# Patient Record
Sex: Male | Born: 1970 | Race: Black or African American | Hispanic: No | Marital: Married | Smoking: Former smoker
Health system: Southern US, Community
[De-identification: ages and names within clinical notes are randomized; demographics above are authoritative.]

## PROBLEM LIST (undated history)

## (undated) DIAGNOSIS — J449 Chronic obstructive pulmonary disease, unspecified: Secondary | ICD-10-CM

## (undated) DIAGNOSIS — C649 Malignant neoplasm of unspecified kidney, except renal pelvis: Secondary | ICD-10-CM

## (undated) DIAGNOSIS — I480 Paroxysmal atrial fibrillation: Secondary | ICD-10-CM

## (undated) DIAGNOSIS — E119 Type 2 diabetes mellitus without complications: Secondary | ICD-10-CM

## (undated) DIAGNOSIS — I10 Essential (primary) hypertension: Secondary | ICD-10-CM

## (undated) DIAGNOSIS — G4733 Obstructive sleep apnea (adult) (pediatric): Secondary | ICD-10-CM

## (undated) DIAGNOSIS — I509 Heart failure, unspecified: Secondary | ICD-10-CM

## (undated) DIAGNOSIS — I219 Acute myocardial infarction, unspecified: Secondary | ICD-10-CM

## (undated) DIAGNOSIS — D869 Sarcoidosis, unspecified: Secondary | ICD-10-CM

## (undated) DIAGNOSIS — N183 Chronic kidney disease, stage 3 unspecified: Secondary | ICD-10-CM

## (undated) DIAGNOSIS — G4730 Sleep apnea, unspecified: Secondary | ICD-10-CM

## (undated) DIAGNOSIS — E7800 Pure hypercholesterolemia, unspecified: Secondary | ICD-10-CM

## (undated) DIAGNOSIS — I2510 Atherosclerotic heart disease of native coronary artery without angina pectoris: Secondary | ICD-10-CM

## (undated) DIAGNOSIS — Z930 Tracheostomy status: Secondary | ICD-10-CM

## (undated) DIAGNOSIS — D860 Sarcoidosis of lung: Secondary | ICD-10-CM

## (undated) DIAGNOSIS — I639 Cerebral infarction, unspecified: Secondary | ICD-10-CM

## (undated) DIAGNOSIS — J9621 Acute and chronic respiratory failure with hypoxia: Secondary | ICD-10-CM

## (undated) HISTORY — PX: APPENDECTOMY: SHX54

## (undated) HISTORY — PX: KNEE SURGERY: SHX244

## (undated) HISTORY — PX: PARTIAL NEPHRECTOMY: SHX414

---

## 1998-08-03 ENCOUNTER — Emergency Department (HOSPITAL_COMMUNITY): Admission: EM | Admit: 1998-08-03 | Discharge: 1998-08-03 | Payer: Self-pay | Admitting: Emergency Medicine

## 1999-07-12 ENCOUNTER — Encounter: Payer: Self-pay | Admitting: Thoracic Surgery

## 1999-07-13 ENCOUNTER — Inpatient Hospital Stay (HOSPITAL_COMMUNITY): Admission: RE | Admit: 1999-07-13 | Discharge: 1999-08-07 | Payer: Self-pay | Admitting: Thoracic Surgery

## 1999-07-14 ENCOUNTER — Encounter: Payer: Self-pay | Admitting: Thoracic Surgery

## 1999-07-15 ENCOUNTER — Encounter: Payer: Self-pay | Admitting: Hematology and Oncology

## 1999-07-17 ENCOUNTER — Encounter: Payer: Self-pay | Admitting: Thoracic Surgery

## 1999-07-18 ENCOUNTER — Encounter: Payer: Self-pay | Admitting: Thoracic Surgery

## 1999-07-19 ENCOUNTER — Encounter: Payer: Self-pay | Admitting: Thoracic Surgery

## 1999-07-20 ENCOUNTER — Encounter: Payer: Self-pay | Admitting: Thoracic Surgery

## 1999-07-22 ENCOUNTER — Encounter: Payer: Self-pay | Admitting: Thoracic Surgery

## 1999-07-22 ENCOUNTER — Encounter: Payer: Self-pay | Admitting: Pulmonary Disease

## 1999-07-23 ENCOUNTER — Encounter: Payer: Self-pay | Admitting: Pulmonary Disease

## 1999-07-24 ENCOUNTER — Encounter: Payer: Self-pay | Admitting: Pulmonary Disease

## 1999-07-25 ENCOUNTER — Encounter: Payer: Self-pay | Admitting: Pulmonary Disease

## 1999-07-26 ENCOUNTER — Encounter: Payer: Self-pay | Admitting: Pulmonary Disease

## 1999-07-27 ENCOUNTER — Encounter: Payer: Self-pay | Admitting: Thoracic Surgery

## 1999-07-28 ENCOUNTER — Encounter: Payer: Self-pay | Admitting: Thoracic Surgery

## 1999-07-29 ENCOUNTER — Encounter: Payer: Self-pay | Admitting: Thoracic Surgery

## 1999-07-30 ENCOUNTER — Encounter: Payer: Self-pay | Admitting: Thoracic Surgery

## 1999-07-31 ENCOUNTER — Encounter: Payer: Self-pay | Admitting: Thoracic Surgery

## 1999-08-01 ENCOUNTER — Encounter: Payer: Self-pay | Admitting: Thoracic Surgery

## 1999-08-02 ENCOUNTER — Encounter: Payer: Self-pay | Admitting: Thoracic Surgery

## 1999-08-03 ENCOUNTER — Encounter: Payer: Self-pay | Admitting: Internal Medicine

## 1999-08-03 ENCOUNTER — Encounter: Payer: Self-pay | Admitting: Thoracic Surgery

## 1999-08-05 ENCOUNTER — Encounter: Payer: Self-pay | Admitting: Thoracic Surgery

## 1999-08-07 ENCOUNTER — Encounter: Payer: Self-pay | Admitting: Thoracic Surgery

## 1999-08-29 ENCOUNTER — Encounter: Admission: RE | Admit: 1999-08-29 | Discharge: 1999-08-29 | Payer: Self-pay | Admitting: Thoracic Surgery

## 1999-08-29 ENCOUNTER — Encounter: Payer: Self-pay | Admitting: Thoracic Surgery

## 1999-11-03 ENCOUNTER — Ambulatory Visit: Admission: RE | Admit: 1999-11-03 | Discharge: 1999-11-03 | Payer: Self-pay | Admitting: Pulmonary Disease

## 2000-03-29 ENCOUNTER — Emergency Department (HOSPITAL_COMMUNITY): Admission: EM | Admit: 2000-03-29 | Discharge: 2000-03-29 | Payer: Self-pay | Admitting: Emergency Medicine

## 2000-03-29 ENCOUNTER — Encounter: Payer: Self-pay | Admitting: Emergency Medicine

## 2000-11-11 ENCOUNTER — Ambulatory Visit (HOSPITAL_COMMUNITY): Admission: RE | Admit: 2000-11-11 | Discharge: 2000-11-11 | Payer: Self-pay | Admitting: Pulmonary Disease

## 2000-11-11 ENCOUNTER — Encounter: Payer: Self-pay | Admitting: Pulmonary Disease

## 2004-12-02 ENCOUNTER — Emergency Department (HOSPITAL_COMMUNITY): Admission: EM | Admit: 2004-12-02 | Discharge: 2004-12-02 | Payer: Self-pay | Admitting: Emergency Medicine

## 2005-09-18 ENCOUNTER — Emergency Department: Payer: Self-pay | Admitting: Emergency Medicine

## 2006-03-13 ENCOUNTER — Emergency Department: Payer: Self-pay | Admitting: Emergency Medicine

## 2006-04-11 ENCOUNTER — Ambulatory Visit: Payer: Self-pay | Admitting: Internal Medicine

## 2006-05-07 ENCOUNTER — Other Ambulatory Visit: Payer: Self-pay

## 2006-05-08 ENCOUNTER — Inpatient Hospital Stay: Payer: Self-pay | Admitting: Internal Medicine

## 2006-05-13 ENCOUNTER — Other Ambulatory Visit: Payer: Self-pay

## 2006-05-20 ENCOUNTER — Ambulatory Visit: Payer: Self-pay | Admitting: Cardiology

## 2006-11-17 ENCOUNTER — Emergency Department: Payer: Self-pay | Admitting: Emergency Medicine

## 2006-11-19 ENCOUNTER — Emergency Department: Payer: Self-pay | Admitting: Emergency Medicine

## 2007-09-29 ENCOUNTER — Other Ambulatory Visit: Payer: Self-pay

## 2007-09-29 ENCOUNTER — Inpatient Hospital Stay: Payer: Self-pay | Admitting: Internal Medicine

## 2007-11-24 ENCOUNTER — Emergency Department: Payer: Self-pay | Admitting: Emergency Medicine

## 2008-07-27 ENCOUNTER — Emergency Department: Payer: Self-pay | Admitting: Emergency Medicine

## 2008-10-24 ENCOUNTER — Emergency Department: Payer: Self-pay | Admitting: Emergency Medicine

## 2008-11-10 ENCOUNTER — Emergency Department: Payer: Self-pay | Admitting: Unknown Physician Specialty

## 2009-04-01 ENCOUNTER — Inpatient Hospital Stay: Payer: Self-pay | Admitting: Internal Medicine

## 2010-01-02 ENCOUNTER — Emergency Department: Payer: Self-pay | Admitting: Emergency Medicine

## 2010-03-25 ENCOUNTER — Inpatient Hospital Stay: Payer: Self-pay | Admitting: Specialist

## 2010-09-22 ENCOUNTER — Emergency Department: Payer: Self-pay | Admitting: Emergency Medicine

## 2011-01-14 ENCOUNTER — Emergency Department: Payer: Self-pay | Admitting: Emergency Medicine

## 2011-10-05 ENCOUNTER — Ambulatory Visit: Payer: Self-pay | Admitting: Internal Medicine

## 2011-10-26 ENCOUNTER — Ambulatory Visit: Payer: Self-pay | Admitting: Internal Medicine

## 2011-12-13 ENCOUNTER — Emergency Department: Payer: Self-pay | Admitting: Emergency Medicine

## 2012-02-02 ENCOUNTER — Emergency Department: Payer: Self-pay | Admitting: Emergency Medicine

## 2012-02-02 LAB — PRO B NATRIURETIC PEPTIDE: B-Type Natriuretic Peptide: 189 pg/mL — ABNORMAL HIGH (ref 0–125)

## 2012-02-02 LAB — RAPID INFLUENZA A&B ANTIGENS

## 2012-02-02 LAB — CBC
HGB: 14.7 g/dL (ref 13.0–18.0)
MCH: 27.8 pg (ref 26.0–34.0)
MCHC: 33 g/dL (ref 32.0–36.0)
MCV: 85 fL (ref 80–100)
Platelet: 195 10*3/uL (ref 150–440)
RBC: 5.27 10*6/uL (ref 4.40–5.90)
RDW: 14 % (ref 11.5–14.5)

## 2012-02-02 LAB — BASIC METABOLIC PANEL
Calcium, Total: 8.2 mg/dL — ABNORMAL LOW (ref 8.5–10.1)
Chloride: 103 mmol/L (ref 98–107)
Co2: 31 mmol/L (ref 21–32)
EGFR (Non-African Amer.): >60
Osmolality: 274 (ref 275–301)
Sodium: 137 mmol/L (ref 136–145)

## 2012-02-02 LAB — CK TOTAL AND CKMB (NOT AT ARMC)
CK, Total: 279 U/L — ABNORMAL HIGH (ref 35–232)
CK-MB: 1.5 ng/mL (ref 0.5–3.6)

## 2012-02-02 LAB — TROPONIN I: Troponin-I: 0.04 ng/mL

## 2012-02-07 ENCOUNTER — Emergency Department: Payer: Self-pay | Admitting: Emergency Medicine

## 2012-02-07 LAB — CBC
HCT: 45.9 % (ref 40.0–52.0)
HGB: 14.9 g/dL (ref 13.0–18.0)
MCHC: 32.4 g/dL (ref 32.0–36.0)
Platelet: 256 10*3/uL (ref 150–440)
RBC: 5.4 10*6/uL (ref 4.40–5.90)
RDW: 13.5 % (ref 11.5–14.5)

## 2012-02-07 LAB — TROPONIN I
Troponin-I: 0.03 ng/mL
Troponin-I: 0.02 ng/mL

## 2012-02-07 LAB — BASIC METABOLIC PANEL
Anion Gap: 4 — ABNORMAL LOW (ref 7–16)
Calcium, Total: 8.4 mg/dL — ABNORMAL LOW (ref 8.5–10.1)
Chloride: 101 mmol/L (ref 98–107)
Co2: 35 mmol/L — ABNORMAL HIGH (ref 21–32)
Creatinine: 1.21 mg/dL (ref 0.60–1.30)
EGFR (African American): >60
EGFR (Non-African Amer.): >60
Potassium: 3.6 mmol/L (ref 3.5–5.1)
Sodium: 140 mmol/L (ref 136–145)

## 2012-02-07 LAB — CULTURE, BLOOD (SINGLE)

## 2012-02-07 LAB — CK TOTAL AND CKMB (NOT AT ARMC): CK, Total: 89 U/L (ref 35–232)

## 2012-02-08 LAB — CULTURE, BLOOD (SINGLE)

## 2013-02-23 ENCOUNTER — Inpatient Hospital Stay: Payer: Self-pay | Admitting: Internal Medicine

## 2013-02-23 LAB — CBC
HCT: 47.2 % (ref 40.0–52.0)
HGB: 15.5 g/dL (ref 13.0–18.0)
MCH: 28.2 pg (ref 26.0–34.0)
MCHC: 32.9 g/dL (ref 32.0–36.0)
MCV: 86 fL (ref 80–100)
Platelet: 193 10*3/uL (ref 150–440)
RBC: 5.51 10*6/uL (ref 4.40–5.90)
RDW: 14.1 % (ref 11.5–14.5)
WBC: 6.6 10*3/uL (ref 3.8–10.6)

## 2013-02-23 LAB — BASIC METABOLIC PANEL
ANION GAP: 1 — AB (ref 7–16)
BUN: 12 mg/dL (ref 7–18)
CO2: 37 mmol/L — AB (ref 21–32)
Calcium, Total: 8.4 mg/dL — ABNORMAL LOW (ref 8.5–10.1)
Chloride: 103 mmol/L (ref 98–107)
Creatinine: 1.39 mg/dL — ABNORMAL HIGH (ref 0.60–1.30)
EGFR (African American): >60
EGFR (Non-African Amer.): >60
GLUCOSE: 232 mg/dL — AB (ref 65–99)
Osmolality: 288 (ref 275–301)
Potassium: 4.1 mmol/L (ref 3.5–5.1)
SODIUM: 141 mmol/L (ref 136–145)

## 2013-02-23 LAB — CK TOTAL AND CKMB (NOT AT ARMC)
CK, TOTAL: 501 U/L — AB (ref 35–232)
CK, Total: 510 U/L — ABNORMAL HIGH (ref 35–232)
CK, Total: 493 U/L — ABNORMAL HIGH (ref 35–232)
CK-MB: 2.8 ng/mL (ref 0.5–3.6)
CK-MB: 2.4 ng/mL (ref 0.5–3.6)
CK-MB: 2.8 ng/mL (ref 0.5–3.6)

## 2013-02-23 LAB — TROPONIN I
TROPONIN-I: 0.15 ng/mL — AB
Troponin-I: 0.19 ng/mL — ABNORMAL HIGH
Troponin-I: 0.18 ng/mL — ABNORMAL HIGH

## 2013-02-23 LAB — PRO B NATRIURETIC PEPTIDE: B-Type Natriuretic Peptide: 320 pg/mL — ABNORMAL HIGH (ref 0–125)

## 2013-02-23 LAB — MAGNESIUM: Magnesium: 1.7 mg/dL — ABNORMAL LOW

## 2013-02-24 LAB — LIPID PANEL
CHOLESTEROL: 140 mg/dL (ref 0–200)
HDL Cholesterol: 35 mg/dL — ABNORMAL LOW (ref 40–60)
Ldl Cholesterol, Calc: 76 mg/dL (ref 0–100)
Triglycerides: 144 mg/dL (ref 0–200)
VLDL CHOLESTEROL, CALC: 29 mg/dL (ref 5–40)

## 2013-02-24 LAB — BASIC METABOLIC PANEL
Anion Gap: 1 — ABNORMAL LOW (ref 7–16)
BUN: 13 mg/dL (ref 7–18)
CREATININE: 1.43 mg/dL — AB (ref 0.60–1.30)
Calcium, Total: 8.1 mg/dL — ABNORMAL LOW (ref 8.5–10.1)
Chloride: 98 mmol/L (ref 98–107)
Co2: 35 mmol/L — ABNORMAL HIGH (ref 21–32)
EGFR (African American): >60
EGFR (Non-African Amer.): 60 — ABNORMAL LOW
Glucose: 102 mg/dL — ABNORMAL HIGH (ref 65–99)
OSMOLALITY: 269 (ref 275–301)
Potassium: 4.3 mmol/L (ref 3.5–5.1)
SODIUM: 134 mmol/L — AB (ref 136–145)

## 2013-02-24 LAB — CBC WITH DIFFERENTIAL/PLATELET
Basophil #: 0 10*3/uL (ref 0.0–0.1)
Basophil %: 0.4 %
Eosinophil #: 0 10*3/uL (ref 0.0–0.7)
Eosinophil %: 0.6 %
HCT: 47.5 % (ref 40.0–52.0)
HGB: 15.3 g/dL (ref 13.0–18.0)
Lymphocyte #: 1.5 10*3/uL (ref 1.0–3.6)
Lymphocyte %: 17.9 %
MCH: 27.9 pg (ref 26.0–34.0)
MCHC: 32.3 g/dL (ref 32.0–36.0)
MCV: 87 fL (ref 80–100)
Monocyte #: 1.3 x10 3/mm — ABNORMAL HIGH (ref 0.2–1.0)
Monocyte %: 15.1 %
NEUTROS PCT: 66 %
Neutrophil #: 5.5 10*3/uL (ref 1.4–6.5)
PLATELETS: 188 10*3/uL (ref 150–440)
RBC: 5.49 10*6/uL (ref 4.40–5.90)
RDW: 14.1 % (ref 11.5–14.5)
WBC: 8.4 10*3/uL (ref 3.8–10.6)

## 2013-02-24 LAB — HEMOGLOBIN A1C: Hemoglobin A1C: 7.8 % — ABNORMAL HIGH (ref 4.2–6.3)

## 2013-02-26 LAB — BASIC METABOLIC PANEL
Anion Gap: 3 — ABNORMAL LOW (ref 7–16)
BUN: 26 mg/dL — ABNORMAL HIGH (ref 7–18)
CHLORIDE: 100 mmol/L (ref 98–107)
Calcium, Total: 9.1 mg/dL (ref 8.5–10.1)
Co2: 34 mmol/L — ABNORMAL HIGH (ref 21–32)
Creatinine: 1.34 mg/dL — ABNORMAL HIGH (ref 0.60–1.30)
EGFR (African American): >60
EGFR (Non-African Amer.): >60
GLUCOSE: 145 mg/dL — AB (ref 65–99)
OSMOLALITY: 281 (ref 275–301)
Potassium: 4.7 mmol/L (ref 3.5–5.1)
SODIUM: 137 mmol/L (ref 136–145)

## 2013-02-27 ENCOUNTER — Inpatient Hospital Stay: Payer: Self-pay | Admitting: Internal Medicine

## 2013-02-27 LAB — COMPREHENSIVE METABOLIC PANEL
ALBUMIN: 3.9 g/dL (ref 3.4–5.0)
ALT: 41 U/L (ref 12–78)
Alkaline Phosphatase: 83 U/L
BILIRUBIN TOTAL: 0.5 mg/dL (ref 0.2–1.0)
BUN: 27 mg/dL — AB (ref 7–18)
CHLORIDE: 98 mmol/L (ref 98–107)
CO2: 41 mmol/L — AB (ref 21–32)
Calcium, Total: 8.7 mg/dL (ref 8.5–10.1)
Creatinine: 1.5 mg/dL — ABNORMAL HIGH (ref 0.60–1.30)
EGFR (African American): >60
EGFR (Non-African Amer.): 57 — ABNORMAL LOW
GLUCOSE: 179 mg/dL — AB (ref 65–99)
OSMOLALITY: 283 (ref 275–301)
Potassium: 4.5 mmol/L (ref 3.5–5.1)
SGOT(AST): 32 U/L (ref 15–37)
SODIUM: 137 mmol/L (ref 136–145)
Total Protein: 8.5 g/dL — ABNORMAL HIGH (ref 6.4–8.2)

## 2013-02-27 LAB — TROPONIN I
TROPONIN-I: 0.06 ng/mL — AB
TROPONIN-I: 0.15 ng/mL — AB
TROPONIN-I: 0.12 ng/mL — AB

## 2013-02-27 LAB — URINALYSIS, COMPLETE
BACTERIA: NONE SEEN
BILIRUBIN, UR: NEGATIVE
Glucose,UR: NEGATIVE mg/dL (ref 0–75)
Ketone: NEGATIVE
LEUKOCYTE ESTERASE: NEGATIVE
Nitrite: NEGATIVE
PROTEIN: NEGATIVE
Ph: 5 (ref 4.5–8.0)
RBC,UR: 1 /HPF (ref 0–5)
Specific Gravity: 1.006 (ref 1.003–1.030)
Squamous Epithelial: NONE SEEN
WBC UR: NONE SEEN /HPF (ref 0–5)

## 2013-02-27 LAB — CK TOTAL AND CKMB (NOT AT ARMC)
CK, TOTAL: 341 U/L — AB (ref 35–232)
CK-MB: 3.6 ng/mL (ref 0.5–3.6)

## 2013-02-27 LAB — PROTIME-INR
INR: 1
PROTHROMBIN TIME: 13.2 s (ref 11.5–14.7)

## 2013-02-27 LAB — PHOSPHORUS: PHOSPHORUS: 3.2 mg/dL (ref 2.5–4.9)

## 2013-02-27 LAB — CK-MB
CK-MB: 2.1 ng/mL (ref 0.5–3.6)
CK-MB: 1 ng/mL (ref 0.5–3.6)

## 2013-02-27 LAB — HEPATIC FUNCTION PANEL A (ARMC): Bilirubin, Direct: 0.2 mg/dL (ref 0.00–0.20)

## 2013-02-27 LAB — MAGNESIUM: Magnesium: 2.2 mg/dL

## 2013-02-28 LAB — CBC WITH DIFFERENTIAL/PLATELET
BASOS PCT: 0.4 %
Basophil #: 0 10*3/uL (ref 0.0–0.1)
EOS ABS: 0 10*3/uL (ref 0.0–0.7)
EOS PCT: 0 %
HCT: 45.9 % (ref 40.0–52.0)
HGB: 14.6 g/dL (ref 13.0–18.0)
LYMPHS PCT: 5.3 %
Lymphocyte #: 0.4 10*3/uL — ABNORMAL LOW (ref 1.0–3.6)
MCH: 27.3 pg (ref 26.0–34.0)
MCHC: 31.9 g/dL — AB (ref 32.0–36.0)
MCV: 86 fL (ref 80–100)
Monocyte #: 1 x10 3/mm (ref 0.2–1.0)
Monocyte %: 12.5 %
NEUTROS PCT: 81.8 %
Neutrophil #: 6.3 10*3/uL (ref 1.4–6.5)
Platelet: 195 10*3/uL (ref 150–440)
RBC: 5.36 10*6/uL (ref 4.40–5.90)
RDW: 14.3 % (ref 11.5–14.5)
WBC: 7.7 10*3/uL (ref 3.8–10.6)

## 2013-02-28 LAB — BASIC METABOLIC PANEL
Anion Gap: 4 — ABNORMAL LOW (ref 7–16)
BUN: 39 mg/dL — ABNORMAL HIGH (ref 7–18)
CALCIUM: 8.2 mg/dL — AB (ref 8.5–10.1)
Chloride: 97 mmol/L — ABNORMAL LOW (ref 98–107)
Co2: 34 mmol/L — ABNORMAL HIGH (ref 21–32)
Creatinine: 1.99 mg/dL — ABNORMAL HIGH (ref 0.60–1.30)
EGFR (African American): 47 — ABNORMAL LOW
EGFR (Non-African Amer.): 40 — ABNORMAL LOW
GLUCOSE: 203 mg/dL — AB (ref 65–99)
Osmolality: 285 (ref 275–301)
Potassium: 3.5 mmol/L (ref 3.5–5.1)
Sodium: 135 mmol/L — ABNORMAL LOW (ref 136–145)

## 2013-03-01 LAB — CREATININE, SERUM
Creatinine: 2.05 mg/dL — ABNORMAL HIGH (ref 0.60–1.30)
EGFR (African American): 45 — ABNORMAL LOW
EGFR (Non-African Amer.): 39 — ABNORMAL LOW

## 2013-03-02 LAB — EXPECTORATED SPUTUM ASSESSMENT W REFEX TO RESP CULTURE

## 2013-03-02 LAB — CBC WITH DIFFERENTIAL/PLATELET
BASOS ABS: 0 10*3/uL (ref 0.0–0.1)
BASOS PCT: 0.2 %
EOS ABS: 0 10*3/uL (ref 0.0–0.7)
EOS PCT: 0 %
HCT: 46.1 % (ref 40.0–52.0)
HGB: 14.8 g/dL (ref 13.0–18.0)
LYMPHS PCT: 5.1 %
Lymphocyte #: 0.7 10*3/uL — ABNORMAL LOW (ref 1.0–3.6)
MCH: 27.5 pg (ref 26.0–34.0)
MCHC: 32.1 g/dL (ref 32.0–36.0)
MCV: 86 fL (ref 80–100)
Monocyte #: 0.8 x10 3/mm (ref 0.2–1.0)
Monocyte %: 6 %
Neutrophil #: 11.3 10*3/uL — ABNORMAL HIGH (ref 1.4–6.5)
Neutrophil %: 88.7 %
Platelet: 216 10*3/uL (ref 150–440)
RBC: 5.37 10*6/uL (ref 4.40–5.90)
RDW: 14.4 % (ref 11.5–14.5)
WBC: 12.8 10*3/uL — AB (ref 3.8–10.6)

## 2013-03-02 LAB — CREATININE, SERUM
CREATININE: 1.7 mg/dL — AB (ref 0.60–1.30)
EGFR (African American): 56 — ABNORMAL LOW
GFR CALC NON AF AMER: 49 — AB

## 2013-03-03 LAB — BASIC METABOLIC PANEL
Anion Gap: 2 — ABNORMAL LOW (ref 7–16)
BUN: 54 mg/dL — ABNORMAL HIGH (ref 7–18)
Calcium, Total: 7.8 mg/dL — ABNORMAL LOW (ref 8.5–10.1)
Chloride: 104 mmol/L (ref 98–107)
Co2: 35 mmol/L — ABNORMAL HIGH (ref 21–32)
Creatinine: 1.67 mg/dL — ABNORMAL HIGH (ref 0.60–1.30)
GFR CALC AF AMER: 58 — AB
GFR CALC NON AF AMER: 50 — AB
GLUCOSE: 182 mg/dL — AB (ref 65–99)
Osmolality: 301 (ref 275–301)
Potassium: 3.4 mmol/L — ABNORMAL LOW (ref 3.5–5.1)
Sodium: 141 mmol/L (ref 136–145)

## 2013-03-03 LAB — PHOSPHORUS: Phosphorus: 2.1 mg/dL — ABNORMAL LOW (ref 2.5–4.9)

## 2013-03-03 LAB — PROTEIN ELECTROPHORESIS(ARMC)

## 2013-03-03 LAB — UR PROT ELECTROPHORESIS, URINE RANDOM

## 2013-03-03 LAB — MAGNESIUM: Magnesium: 2.6 mg/dL — ABNORMAL HIGH

## 2013-03-04 LAB — PHOSPHORUS: Phosphorus: 2.6 mg/dL (ref 2.5–4.9)

## 2013-03-04 LAB — BASIC METABOLIC PANEL
ANION GAP: 4 — AB (ref 7–16)
BUN: 39 mg/dL — ABNORMAL HIGH (ref 7–18)
CHLORIDE: 103 mmol/L (ref 98–107)
Calcium, Total: 7.5 mg/dL — ABNORMAL LOW (ref 8.5–10.1)
Co2: 32 mmol/L (ref 21–32)
Creatinine: 1.53 mg/dL — ABNORMAL HIGH (ref 0.60–1.30)
EGFR (African American): >60
EGFR (Non-African Amer.): 55 — ABNORMAL LOW
GLUCOSE: 199 mg/dL — AB (ref 65–99)
Osmolality: 293 (ref 275–301)
Potassium: 3.4 mmol/L — ABNORMAL LOW (ref 3.5–5.1)
Sodium: 139 mmol/L (ref 136–145)

## 2013-03-04 LAB — CBC WITH DIFFERENTIAL/PLATELET
Basophil #: 0 10*3/uL (ref 0.0–0.1)
Basophil %: 0.4 %
Eosinophil #: 0.1 10*3/uL (ref 0.0–0.7)
Eosinophil %: 0.5 %
HCT: 41.6 % (ref 40.0–52.0)
HGB: 13.9 g/dL (ref 13.0–18.0)
Lymphocyte #: 1.4 10*3/uL (ref 1.0–3.6)
Lymphocyte %: 13.4 %
MCH: 28.4 pg (ref 26.0–34.0)
MCHC: 33.3 g/dL (ref 32.0–36.0)
MCV: 85 fL (ref 80–100)
MONO ABS: 1.2 x10 3/mm — AB (ref 0.2–1.0)
Monocyte %: 11.4 %
NEUTROS PCT: 74.3 %
Neutrophil #: 7.6 10*3/uL — ABNORMAL HIGH (ref 1.4–6.5)
Platelet: 191 10*3/uL (ref 150–440)
RBC: 4.87 10*6/uL (ref 4.40–5.90)
RDW: 14.2 % (ref 11.5–14.5)
WBC: 10.3 10*3/uL (ref 3.8–10.6)

## 2013-03-04 LAB — TRIGLYCERIDES: Triglycerides: 547 mg/dL — ABNORMAL HIGH (ref 0–200)

## 2013-03-04 LAB — CULTURE, BLOOD (SINGLE)

## 2013-03-04 LAB — MAGNESIUM: Magnesium: 2.6 mg/dL — ABNORMAL HIGH

## 2013-03-05 LAB — CBC WITH DIFFERENTIAL/PLATELET
BASOS PCT: 0.1 %
Basophil #: 0 10*3/uL (ref 0.0–0.1)
EOS ABS: 0 10*3/uL (ref 0.0–0.7)
Eosinophil %: 0.4 %
HCT: 42.6 % (ref 40.0–52.0)
HGB: 14 g/dL (ref 13.0–18.0)
Lymphocyte #: 1.1 10*3/uL (ref 1.0–3.6)
Lymphocyte %: 9.3 %
MCH: 28 pg (ref 26.0–34.0)
MCHC: 32.9 g/dL (ref 32.0–36.0)
MCV: 85 fL (ref 80–100)
MONOS PCT: 10.1 %
Monocyte #: 1.2 x10 3/mm — ABNORMAL HIGH (ref 0.2–1.0)
Neutrophil #: 9.2 10*3/uL — ABNORMAL HIGH (ref 1.4–6.5)
Neutrophil %: 80.1 %
PLATELETS: 196 10*3/uL (ref 150–440)
RBC: 5 10*6/uL (ref 4.40–5.90)
RDW: 14.4 % (ref 11.5–14.5)
WBC: 11.5 10*3/uL — ABNORMAL HIGH (ref 3.8–10.6)

## 2013-03-05 LAB — BASIC METABOLIC PANEL
Anion Gap: 2 — ABNORMAL LOW (ref 7–16)
BUN: 31 mg/dL — ABNORMAL HIGH (ref 7–18)
Calcium, Total: 7.9 mg/dL — ABNORMAL LOW (ref 8.5–10.1)
Chloride: 103 mmol/L (ref 98–107)
Co2: 33 mmol/L — ABNORMAL HIGH (ref 21–32)
Creatinine: 1.37 mg/dL — ABNORMAL HIGH (ref 0.60–1.30)
EGFR (African American): >60
EGFR (Non-African Amer.): >60
Glucose: 220 mg/dL — ABNORMAL HIGH (ref 65–99)
Osmolality: 289 (ref 275–301)
POTASSIUM: 4 mmol/L (ref 3.5–5.1)
Sodium: 138 mmol/L (ref 136–145)

## 2013-03-05 LAB — MAGNESIUM: MAGNESIUM: 2.4 mg/dL

## 2013-03-07 LAB — CBC WITH DIFFERENTIAL/PLATELET
Basophil #: 0.1 10*3/uL (ref 0.0–0.1)
Basophil %: 0.4 %
Eosinophil #: 0.1 10*3/uL (ref 0.0–0.7)
Eosinophil %: 0.4 %
HCT: 42.3 % (ref 40.0–52.0)
HGB: 13.9 g/dL (ref 13.0–18.0)
LYMPHS ABS: 1.6 10*3/uL (ref 1.0–3.6)
Lymphocyte %: 11.2 %
MCH: 28 pg (ref 26.0–34.0)
MCHC: 32.7 g/dL (ref 32.0–36.0)
MCV: 86 fL (ref 80–100)
MONOS PCT: 16.9 %
Monocyte #: 2.4 x10 3/mm — ABNORMAL HIGH (ref 0.2–1.0)
NEUTROS PCT: 71.1 %
Neutrophil #: 10.2 10*3/uL — ABNORMAL HIGH (ref 1.4–6.5)
Platelet: 210 10*3/uL (ref 150–440)
RBC: 4.95 10*6/uL (ref 4.40–5.90)
RDW: 14.5 % (ref 11.5–14.5)
WBC: 14.3 10*3/uL — ABNORMAL HIGH (ref 3.8–10.6)

## 2013-03-07 LAB — BASIC METABOLIC PANEL
Anion Gap: 3 — ABNORMAL LOW (ref 7–16)
BUN: 29 mg/dL — AB (ref 7–18)
CALCIUM: 8.1 mg/dL — AB (ref 8.5–10.1)
CREATININE: 1.42 mg/dL — AB (ref 0.60–1.30)
Chloride: 108 mmol/L — ABNORMAL HIGH (ref 98–107)
Co2: 30 mmol/L (ref 21–32)
EGFR (African American): >60
EGFR (Non-African Amer.): >60
GLUCOSE: 110 mg/dL — AB (ref 65–99)
Osmolality: 288 (ref 275–301)
Potassium: 3.8 mmol/L (ref 3.5–5.1)
SODIUM: 141 mmol/L (ref 136–145)

## 2013-03-07 LAB — MAGNESIUM: Magnesium: 2 mg/dL

## 2013-03-07 LAB — PHOSPHORUS: Phosphorus: 3.1 mg/dL (ref 2.5–4.9)

## 2013-03-07 LAB — CLOSTRIDIUM DIFFICILE(ARMC)

## 2013-03-08 LAB — BASIC METABOLIC PANEL
Anion Gap: 5 — ABNORMAL LOW (ref 7–16)
BUN: 26 mg/dL — AB (ref 7–18)
CALCIUM: 8.4 mg/dL — AB (ref 8.5–10.1)
CHLORIDE: 108 mmol/L — AB (ref 98–107)
Co2: 28 mmol/L (ref 21–32)
Creatinine: 1.52 mg/dL — ABNORMAL HIGH (ref 0.60–1.30)
EGFR (African American): >60
EGFR (Non-African Amer.): 56 — ABNORMAL LOW
Glucose: 180 mg/dL — ABNORMAL HIGH (ref 65–99)
Osmolality: 291 (ref 275–301)
Potassium: 3.5 mmol/L (ref 3.5–5.1)
Sodium: 141 mmol/L (ref 136–145)

## 2013-03-08 LAB — CBC WITH DIFFERENTIAL/PLATELET
Bands: 1 %
Comment - H1-Com1: NORMAL
HCT: 40.4 % (ref 40.0–52.0)
HGB: 13.2 g/dL (ref 13.0–18.0)
Lymphocytes: 15 %
MCH: 27.9 pg (ref 26.0–34.0)
MCHC: 32.7 g/dL (ref 32.0–36.0)
MCV: 85 fL (ref 80–100)
METAMYELOCYTE: 1 %
MONOS PCT: 17 %
Myelocyte: 1 %
PLATELETS: 216 10*3/uL (ref 150–440)
RBC: 4.75 10*6/uL (ref 4.40–5.90)
RDW: 14.1 % (ref 11.5–14.5)
SEGMENTED NEUTROPHILS: 65 %
WBC: 13.2 10*3/uL — AB (ref 3.8–10.6)

## 2013-03-08 LAB — VANCOMYCIN, TROUGH: Vancomycin, Trough: 24 ug/mL (ref 10–20)

## 2013-03-08 LAB — MAGNESIUM: MAGNESIUM: 2.1 mg/dL

## 2013-03-08 LAB — PHOSPHORUS: Phosphorus: 3 mg/dL (ref 2.5–4.9)

## 2013-03-09 ENCOUNTER — Ambulatory Visit (HOSPITAL_COMMUNITY)
Admission: AD | Admit: 2013-03-09 | Discharge: 2013-03-09 | Disposition: A | Discharge status: Home / Self Care | Payer: Medicaid Other | Source: Other Acute Inpatient Hospital | Attending: Internal Medicine | Admitting: Internal Medicine

## 2013-03-09 DIAGNOSIS — Z9911 Dependence on respirator [ventilator] status: Secondary | ICD-10-CM | POA: Insufficient documentation

## 2013-03-09 DIAGNOSIS — J9600 Acute respiratory failure, unspecified whether with hypoxia or hypercapnia: Secondary | ICD-10-CM | POA: Diagnosis present

## 2013-03-09 LAB — CBC WITH DIFFERENTIAL/PLATELET
Basophil #: 0.1 10*3/uL (ref 0.0–0.1)
Basophil %: 0.7 %
Eosinophil #: 0.2 10*3/uL (ref 0.0–0.7)
Eosinophil %: 1.2 %
HCT: 39.8 % — ABNORMAL LOW (ref 40.0–52.0)
HGB: 13 g/dL (ref 13.0–18.0)
Lymphocyte #: 1.3 10*3/uL (ref 1.0–3.6)
Lymphocyte %: 9.9 %
MCH: 27.9 pg (ref 26.0–34.0)
MCHC: 32.7 g/dL (ref 32.0–36.0)
MCV: 85 fL (ref 80–100)
MONO ABS: 2.3 x10 3/mm — AB (ref 0.2–1.0)
Monocyte %: 17.4 %
Neutrophil #: 9.5 10*3/uL — ABNORMAL HIGH (ref 1.4–6.5)
Neutrophil %: 70.8 %
Platelet: 216 10*3/uL (ref 150–440)
RBC: 4.67 10*6/uL (ref 4.40–5.90)
RDW: 14.2 % (ref 11.5–14.5)
WBC: 13.5 10*3/uL — AB (ref 3.8–10.6)

## 2013-03-09 LAB — BASIC METABOLIC PANEL
Anion Gap: 3 — ABNORMAL LOW (ref 7–16)
BUN: 22 mg/dL — ABNORMAL HIGH (ref 7–18)
CO2: 27 mmol/L (ref 21–32)
Calcium, Total: 8.4 mg/dL — ABNORMAL LOW (ref 8.5–10.1)
Chloride: 108 mmol/L — ABNORMAL HIGH (ref 98–107)
Creatinine: 1.4 mg/dL — ABNORMAL HIGH (ref 0.60–1.30)
EGFR (African American): >60
EGFR (Non-African Amer.): >60
GLUCOSE: 183 mg/dL — AB (ref 65–99)
Osmolality: 284 (ref 275–301)
Potassium: 3.9 mmol/L (ref 3.5–5.1)
SODIUM: 138 mmol/L (ref 136–145)

## 2013-03-09 LAB — PHOSPHORUS: Phosphorus: 3 mg/dL (ref 2.5–4.9)

## 2013-03-09 LAB — EXPECTORATED SPUTUM ASSESSMENT W GRAM STAIN, RFLX TO RESP C

## 2013-03-09 LAB — MAGNESIUM: Magnesium: 1.9 mg/dL

## 2013-04-14 ENCOUNTER — Other Ambulatory Visit: Payer: Self-pay | Admitting: *Deleted

## 2013-04-14 MED ORDER — FENTANYL 25 MCG/HR TD PT72: MEDICATED_PATCH | TRANSDERMAL | Status: DC

## 2013-04-14 NOTE — Telephone Encounter (Signed)
[facility] 

## 2013-04-17 ENCOUNTER — Non-Acute Institutional Stay (SKILLED_NURSING_FACILITY): Payer: Medicaid Other | Admitting: Internal Medicine

## 2013-04-17 DIAGNOSIS — D869 Sarcoidosis, unspecified: Secondary | ICD-10-CM

## 2013-04-17 DIAGNOSIS — I509 Heart failure, unspecified: Secondary | ICD-10-CM

## 2013-04-17 DIAGNOSIS — J9500 Unspecified tracheostomy complication: Secondary | ICD-10-CM

## 2013-04-17 DIAGNOSIS — J449 Chronic obstructive pulmonary disease, unspecified: Secondary | ICD-10-CM

## 2013-04-17 DIAGNOSIS — J4489 Other specified chronic obstructive pulmonary disease: Secondary | ICD-10-CM

## 2013-04-17 DIAGNOSIS — I6990 Unspecified sequelae of unspecified cerebrovascular disease: Secondary | ICD-10-CM

## 2013-04-17 DIAGNOSIS — J9620 Acute and chronic respiratory failure, unspecified whether with hypoxia or hypercapnia: Secondary | ICD-10-CM

## 2013-04-17 DIAGNOSIS — E785 Hyperlipidemia, unspecified: Secondary | ICD-10-CM

## 2013-04-22 DIAGNOSIS — E7800 Pure hypercholesterolemia, unspecified: Secondary | ICD-10-CM | POA: Insufficient documentation

## 2013-04-22 DIAGNOSIS — J9500 Unspecified tracheostomy complication: Secondary | ICD-10-CM | POA: Insufficient documentation

## 2013-04-22 DIAGNOSIS — I509 Heart failure, unspecified: Secondary | ICD-10-CM | POA: Insufficient documentation

## 2013-04-22 DIAGNOSIS — E785 Hyperlipidemia, unspecified: Secondary | ICD-10-CM | POA: Insufficient documentation

## 2013-04-22 DIAGNOSIS — D869 Sarcoidosis, unspecified: Secondary | ICD-10-CM | POA: Insufficient documentation

## 2013-04-22 DIAGNOSIS — J449 Chronic obstructive pulmonary disease, unspecified: Secondary | ICD-10-CM | POA: Insufficient documentation

## 2013-04-22 DIAGNOSIS — J9620 Acute and chronic respiratory failure, unspecified whether with hypoxia or hypercapnia: Secondary | ICD-10-CM | POA: Insufficient documentation

## 2013-04-22 DIAGNOSIS — J4489 Other specified chronic obstructive pulmonary disease: Secondary | ICD-10-CM | POA: Insufficient documentation

## 2013-04-22 DIAGNOSIS — I6990 Unspecified sequelae of unspecified cerebrovascular disease: Secondary | ICD-10-CM | POA: Insufficient documentation

## 2013-04-22 NOTE — Progress Notes (Signed)
Patient ID: [name], male   DOB: 09-Jun-1970, 43 y.o.   MRN: [id number]                   HISTORY & PHYSICAL  DATE:  04/17/2013    FACILITY: [facility]    LEVEL OF CARE:   SNF   CHIEF COMPLAINT:  Transfer from [facility], [place].    HISTORY OF PRESENT ILLNESS:  [name] is a gentleman with a history of sarcoid and COPD.  He was admitted, I believe, to [facility] in respiratory distress.  I have none of this information.  He was ventilated initially.  He required a tracheostomy.  He initially required care in the ICU at [facility] with very high doses of sedating agents.  Eventually, he was changed to a tracheostomy.  He seems to have tolerated this well with good O2 sats.    He was also discovered to have a severe acute sinusitis and likely a retropharyngeal abscess.  He had CT scans repeated at [facility] and a prolonged course of antibiotics.  ENT saw the patient.  He was not found to have a retropharyngeal abscess.  He was found to have a dental abscess and this was felt to be the precursor of severe acute sinusitis.    PAST MEDICAL HISTORY/PROBLEM LIST:    Sarcoidosis, the patient states since 2000.  He did have a lung biopsy.    Obstructive sleep apnea.  Using CPAP at home.    COPD.  Not on chronic oxygen.    Acute renal failure during illness, but this resolved at [facility].    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    Acute-on-chronic pain syndrome, seen by pain physician, Dr. Chancy Milroy, at [place].      Severe sinusitis, complication of a dental abscess.  He completed antibiotic therapy.    Mild systolic dysfunction with an EF of 40-45%.     Coronary artery disease, single vessel, on medical management.   Hypertension.    PAST SURGICAL HISTORY:    Appendectomy.    Tracheostomy on 03/09/2013.    Right knee surgery.    Lung biopsy.    CURRENT MEDICATIONS:  Medication list is reviewed.     Acetylcysteine 2.5 mL every 6 hours.    ASA 81 q.d.    Atorvastatin 40 q.d.    Chlorhexidine for oral care every 12 hours.    Clonidine patch 0.2 every 7 hours.    Enoxaparin 40 mg subcu q.24.  This is for DVT prophylaxis.    I think this can probably stop.    Fentanyl 25 mcg topical therapy every 72 hours.    Fluticasone/salmeterol 100/50 b.i.d.    Metoprolol 100 q.12.    Senna 1 p.o. b.i.d.    SOCIAL HISTORY:   TOBACCO USE:  The patient states he was a smoker until recently.  Changed to E-cigarettes.   HOUSING:  Lives with his girlfriend in [place].   FUNCTIONAL STATUS:  He was ambulatory.  Short of breath on exertion with a chronic cough, but did not seem limited in his ADLs and IADLs.      REVIEW OF SYSTEMS:   CHEST/RESPIRATORY:  He states he feels a lot better.   CARDIAC:   No current clear chest pain.   GI:  No nausea, vomiting or diarrhea.   GU:  No incontinence.   MUSCULOSKELETAL:  Complains of weakness in his lower extremities, making it difficult  for him to walk.  Also complaining of balance problems.    PHYSICAL EXAMINATION:   VITAL SIGNS:   PULSE:  70.   RESPIRATIONS:   18, free and easy and unlabored.   GENERAL APPEARANCE:  Remarkably well-looking man considering what he has been through.   HEENT:   MOUTH/THROAT:   He has a tracheostomy in place.  This is capped.  He speaks in full sentences.  He is in no respiratory distress.   CHEST/RESPIRATORY:  Decreased air entry bilaterally, but no crackles or wheezes.   Work of breathing is normal.   CARDIOVASCULAR:  CARDIAC:   Heart sounds are normal.  There are no murmurs.  He appears to be euvolemic.   GASTROINTESTINAL:  ABDOMEN:   Soft.  No masses.   LIVER/SPLEEN/KIDNEYS:  No liver, no spleen.   NEUROLOGICAL:    SENSATION/STRENGTH:  Extremities:  He has 4/5 hip flexor and abductor strength.   DEEP TENDON REFLEXES:  Reflexes are absent in the lower extremities, very markedly reduced in the upper  extremities.     He is not a listed diabetic.  There is no Hoffmann's reflex.    ASSESSMENT/PLAN:  Acute-on-chronic  respiratory failure secondary to COPD and sleep apnea.  He now has a tracheostomy that he is tolerating well with capping.  I suspect this can probably be removed at some point.    Sarcoidosis.  Proven by lung biopsy.  The exact role in anything is unclear.    Lower extremity weakness, which may be mostly disuse.  I wonder about critical illness neuropathy.    Acute renal failure.  This has resolved.    Acute CVA in the corona radiata.  There is very little sequelae of this, at least at the bedside.    Severe sinusitis secondary to a dental abscess.  This appears to be resolved.    Mild systolic dysfunction with an ejection fraction of 40-45%.  This does not appear to be unstable.  He is not on any diuretics.  One would wonder if this is going to resolve down the road, as well.    Hyperlipidemia.  On atorvastatin.    DVT prophylactic doses of Lovenox.  I think this can stop.     CPT CODE: [phone]

## 2013-05-02 DIAGNOSIS — D869 Sarcoidosis, unspecified: Secondary | ICD-10-CM

## 2013-05-02 DIAGNOSIS — J9610 Chronic respiratory failure, unspecified whether with hypoxia or hypercapnia: Secondary | ICD-10-CM

## 2013-05-02 DIAGNOSIS — J441 Chronic obstructive pulmonary disease with (acute) exacerbation: Secondary | ICD-10-CM

## 2013-05-02 DIAGNOSIS — I5020 Unspecified systolic (congestive) heart failure: Secondary | ICD-10-CM

## 2013-05-11 LAB — CBC
HCT: 36.7 % — ABNORMAL LOW (ref 40.0–52.0)
HGB: 12.1 g/dL — ABNORMAL LOW (ref 13.0–18.0)
MCH: 27.7 pg (ref 26.0–34.0)
MCHC: 33 g/dL (ref 32.0–36.0)
MCV: 84 fL (ref 80–100)
Platelet: 224 10*3/uL (ref 150–440)
RBC: 4.38 10*6/uL — ABNORMAL LOW (ref 4.40–5.90)
RDW: 15 % — AB (ref 11.5–14.5)
WBC: 8 10*3/uL (ref 3.8–10.6)

## 2013-05-11 LAB — COMPREHENSIVE METABOLIC PANEL
ALK PHOS: 80 U/L
ALT: 24 U/L (ref 12–78)
ANION GAP: 3 — AB (ref 7–16)
Albumin: 3.2 g/dL — ABNORMAL LOW (ref 3.4–5.0)
BUN: 13 mg/dL (ref 7–18)
Bilirubin,Total: 0.4 mg/dL (ref 0.2–1.0)
CHLORIDE: 108 mmol/L — AB (ref 98–107)
CO2: 31 mmol/L (ref 21–32)
Calcium, Total: 8.2 mg/dL — ABNORMAL LOW (ref 8.5–10.1)
Creatinine: 1.36 mg/dL — ABNORMAL HIGH (ref 0.60–1.30)
EGFR (African American): >60
Glucose: 114 mg/dL — ABNORMAL HIGH (ref 65–99)
Osmolality: 284 (ref 275–301)
Potassium: 3.7 mmol/L (ref 3.5–5.1)
SGOT(AST): 20 U/L (ref 15–37)
SODIUM: 142 mmol/L (ref 136–145)
Total Protein: 7.3 g/dL (ref 6.4–8.2)

## 2013-05-11 LAB — URINALYSIS, COMPLETE
Bacteria: NONE SEEN
Bilirubin,UR: NEGATIVE
Blood: NEGATIVE
Glucose,UR: NEGATIVE mg/dL (ref 0–75)
Ketone: NEGATIVE
Leukocyte Esterase: NEGATIVE
NITRITE: NEGATIVE
Ph: 7 (ref 4.5–8.0)
Protein: NEGATIVE
SQUAMOUS EPITHELIAL: NONE SEEN
Specific Gravity: 1.015 (ref 1.003–1.030)
WBC UR: NONE SEEN /HPF (ref 0–5)

## 2013-05-11 LAB — PROTIME-INR
INR: 1.1
PROTHROMBIN TIME: 13.9 s (ref 11.5–14.7)

## 2013-05-11 LAB — OCCULT BLOOD X 1 CARD TO LAB, STOOL: Occult Blood, Feces: POSITIVE

## 2013-05-11 LAB — LIPASE, BLOOD: LIPASE: 274 U/L (ref 73–393)

## 2013-05-12 ENCOUNTER — Inpatient Hospital Stay: Payer: Self-pay | Admitting: Internal Medicine

## 2013-05-12 LAB — HEMATOCRIT
HCT: 35.6 % — ABNORMAL LOW (ref 40.0–52.0)
HCT: 35.7 % — ABNORMAL LOW (ref 40.0–52.0)

## 2013-05-12 LAB — CLOSTRIDIUM DIFFICILE(ARMC)

## 2013-05-12 LAB — HEMOGLOBIN
HGB: 11.8 g/dL — AB (ref 13.0–18.0)
HGB: 11.8 g/dL — ABNORMAL LOW (ref 13.0–18.0)

## 2013-05-13 LAB — CBC WITH DIFFERENTIAL/PLATELET
BASOS ABS: 0 10*3/uL (ref 0.0–0.1)
Basophil %: 0.4 %
Eosinophil #: 0.2 10*3/uL (ref 0.0–0.7)
Eosinophil %: 2.3 %
HCT: 34.2 % — AB (ref 40.0–52.0)
HGB: 11.6 g/dL — ABNORMAL LOW (ref 13.0–18.0)
LYMPHS PCT: 23.5 %
Lymphocyte #: 1.7 10*3/uL (ref 1.0–3.6)
MCH: 27.9 pg (ref 26.0–34.0)
MCHC: 34.1 g/dL (ref 32.0–36.0)
MCV: 82 fL (ref 80–100)
MONO ABS: 1.2 x10 3/mm — AB (ref 0.2–1.0)
Monocyte %: 16.3 %
NEUTROS ABS: 4.1 10*3/uL (ref 1.4–6.5)
Neutrophil %: 57.5 %
Platelet: 217 10*3/uL (ref 150–440)
RBC: 4.17 10*6/uL — AB (ref 4.40–5.90)
RDW: 14.6 % — AB (ref 11.5–14.5)
WBC: 7.1 10*3/uL (ref 3.8–10.6)

## 2013-05-13 LAB — BASIC METABOLIC PANEL
ANION GAP: 6 — AB (ref 7–16)
BUN: 7 mg/dL (ref 7–18)
CO2: 27 mmol/L (ref 21–32)
CREATININE: 0.97 mg/dL (ref 0.60–1.30)
Calcium, Total: 8.3 mg/dL — ABNORMAL LOW (ref 8.5–10.1)
Chloride: 106 mmol/L (ref 98–107)
EGFR (Non-African Amer.): >60
GLUCOSE: 105 mg/dL — AB (ref 65–99)
OSMOLALITY: 276 (ref 275–301)
Potassium: 3.4 mmol/L — ABNORMAL LOW (ref 3.5–5.1)
Sodium: 139 mmol/L (ref 136–145)

## 2013-05-14 LAB — BASIC METABOLIC PANEL
Anion Gap: 4 — ABNORMAL LOW (ref 7–16)
BUN: 10 mg/dL (ref 7–18)
CHLORIDE: 106 mmol/L (ref 98–107)
Calcium, Total: 8.1 mg/dL — ABNORMAL LOW (ref 8.5–10.1)
Co2: 29 mmol/L (ref 21–32)
Creatinine: 1.1 mg/dL (ref 0.60–1.30)
EGFR (Non-African Amer.): >60
Glucose: 104 mg/dL — ABNORMAL HIGH (ref 65–99)
Osmolality: 277 (ref 275–301)
Potassium: 3.6 mmol/L (ref 3.5–5.1)
Sodium: 139 mmol/L (ref 136–145)

## 2013-05-14 LAB — STOOL CULTURE

## 2013-05-14 LAB — MAGNESIUM: MAGNESIUM: 1.8 mg/dL

## 2013-06-01 ENCOUNTER — Inpatient Hospital Stay: Payer: Self-pay | Admitting: Internal Medicine

## 2013-06-01 LAB — CBC
HCT: 40.1 % (ref 40.0–52.0)
HGB: 13 g/dL (ref 13.0–18.0)
MCH: 27.1 pg (ref 26.0–34.0)
MCHC: 32.5 g/dL (ref 32.0–36.0)
MCV: 83 fL (ref 80–100)
Platelet: 234 10*3/uL (ref 150–440)
RBC: 4.82 10*6/uL (ref 4.40–5.90)
RDW: 15.1 % — AB (ref 11.5–14.5)
WBC: 15.9 10*3/uL — ABNORMAL HIGH (ref 3.8–10.6)

## 2013-06-01 LAB — COMPREHENSIVE METABOLIC PANEL
ALK PHOS: 88 U/L
Albumin: 3.8 g/dL (ref 3.4–5.0)
Anion Gap: 6 — ABNORMAL LOW (ref 7–16)
BUN: 13 mg/dL (ref 7–18)
Bilirubin,Total: 0.7 mg/dL (ref 0.2–1.0)
CALCIUM: 9.1 mg/dL (ref 8.5–10.1)
CHLORIDE: 103 mmol/L (ref 98–107)
CREATININE: 1.33 mg/dL — AB (ref 0.60–1.30)
Co2: 28 mmol/L (ref 21–32)
EGFR (African American): >60
EGFR (Non-African Amer.): >60
Glucose: 109 mg/dL — ABNORMAL HIGH (ref 65–99)
Osmolality: 275 (ref 275–301)
Potassium: 3.5 mmol/L (ref 3.5–5.1)
SGOT(AST): 24 U/L (ref 15–37)
SGPT (ALT): 54 U/L (ref 12–78)
SODIUM: 137 mmol/L (ref 136–145)
Total Protein: 8.4 g/dL — ABNORMAL HIGH (ref 6.4–8.2)

## 2013-06-01 LAB — URINALYSIS, COMPLETE
BILIRUBIN, UR: NEGATIVE
BLOOD: NEGATIVE
Glucose,UR: NEGATIVE mg/dL (ref 0–75)
Ketone: NEGATIVE
Leukocyte Esterase: NEGATIVE
Nitrite: NEGATIVE
PH: 5 (ref 4.5–8.0)
RBC,UR: 1 /HPF (ref 0–5)
SPECIFIC GRAVITY: 1.03 (ref 1.003–1.030)
WBC UR: 3 /HPF (ref 0–5)

## 2013-06-01 LAB — LIPASE, BLOOD: LIPASE: 166 U/L (ref 73–393)

## 2013-06-01 LAB — CLOSTRIDIUM DIFFICILE(ARMC)

## 2013-06-02 LAB — CBC WITH DIFFERENTIAL/PLATELET
Basophil #: 0 10*3/uL (ref 0.0–0.1)
Basophil %: 0.5 %
EOS ABS: 0.2 10*3/uL (ref 0.0–0.7)
Eosinophil %: 2.6 %
HCT: 35.4 % — ABNORMAL LOW (ref 40.0–52.0)
HGB: 11.9 g/dL — AB (ref 13.0–18.0)
LYMPHS PCT: 25.7 %
Lymphocyte #: 1.8 10*3/uL (ref 1.0–3.6)
MCH: 28.2 pg (ref 26.0–34.0)
MCHC: 33.5 g/dL (ref 32.0–36.0)
MCV: 84 fL (ref 80–100)
MONO ABS: 1.2 x10 3/mm — AB (ref 0.2–1.0)
Monocyte %: 16.7 %
NEUTROS ABS: 3.9 10*3/uL (ref 1.4–6.5)
NEUTROS PCT: 54.5 %
Platelet: 192 10*3/uL (ref 150–440)
RBC: 4.2 10*6/uL — AB (ref 4.40–5.90)
RDW: 14.8 % — AB (ref 11.5–14.5)
WBC: 7.1 10*3/uL (ref 3.8–10.6)

## 2013-06-02 LAB — BASIC METABOLIC PANEL
Anion Gap: 6 — ABNORMAL LOW (ref 7–16)
BUN: 11 mg/dL (ref 7–18)
CHLORIDE: 107 mmol/L (ref 98–107)
CREATININE: 1.08 mg/dL (ref 0.60–1.30)
Calcium, Total: 8.3 mg/dL — ABNORMAL LOW (ref 8.5–10.1)
Co2: 28 mmol/L (ref 21–32)
EGFR (Non-African Amer.): >60
Glucose: 129 mg/dL — ABNORMAL HIGH (ref 65–99)
OSMOLALITY: 282 (ref 275–301)
POTASSIUM: 3.8 mmol/L (ref 3.5–5.1)
SODIUM: 141 mmol/L (ref 136–145)

## 2014-05-28 ENCOUNTER — Emergency Department: Admit: 2014-05-28 | Disposition: A | Discharge status: Home / Self Care | Payer: Self-pay | Admitting: Student

## 2014-05-28 LAB — BASIC METABOLIC PANEL
ANION GAP: 5 — AB (ref 7–16)
BUN: 12 mg/dL
Calcium, Total: 8.4 mg/dL — ABNORMAL LOW
Chloride: 105 mmol/L
Co2: 30 mmol/L
Creatinine: 1.2 mg/dL
EGFR (Non-African Amer.): >60
Glucose: 106 mg/dL — ABNORMAL HIGH
POTASSIUM: 3.9 mmol/L
SODIUM: 140 mmol/L

## 2014-05-28 LAB — CBC WITH DIFFERENTIAL/PLATELET
BASOS PCT: 0.3 %
Basophil #: 0 10*3/uL (ref 0.0–0.1)
EOS ABS: 0.1 10*3/uL (ref 0.0–0.7)
EOS PCT: 1.3 %
HCT: 45.2 % (ref 40.0–52.0)
HGB: 15.1 g/dL (ref 13.0–18.0)
LYMPHS PCT: 18.6 %
Lymphocyte #: 1.7 10*3/uL (ref 1.0–3.6)
MCH: 27.8 pg (ref 26.0–34.0)
MCHC: 33.5 g/dL (ref 32.0–36.0)
MCV: 83 fL (ref 80–100)
MONOS PCT: 15.8 %
Monocyte #: 1.5 x10 3/mm — ABNORMAL HIGH (ref 0.2–1.0)
NEUTROS ABS: 5.9 10*3/uL (ref 1.4–6.5)
NEUTROS PCT: 64 %
Platelet: 185 10*3/uL (ref 150–440)
RBC: 5.45 10*6/uL (ref 4.40–5.90)
RDW: 13.7 % (ref 11.5–14.5)
WBC: 9.3 10*3/uL (ref 3.8–10.6)

## 2014-05-29 NOTE — H&P (Signed)
PATIENT NAME:  [name], [name] MR#:  [id number] DATE OF BIRTH:  November 06, 1970  DATE OF ADMISSION:  06/01/2013  PRIMARY CARE PHYSICIAN: Dr. Megan Salon.  CHIEF COMPLAINT: Diarrhea, abdominal pain.   HISTORY OF PRESENT ILLNESS: The patient is a 44 year old African American male patient with history of diabetes, hypertension, COPD, and recent C. difficile and Campylobacter jejuni infection, returns to the Emergency Room complaining of severe diarrhea, abdominal pain. The patient mentions that the belly pain is diffuse all over, although better here in the Emergency Room. The patient mentions that he finished his antibiotics one week prior. He was fine until 4 days, and then this diarrhea started. He has not had any blood in his stool. No nausea, vomiting. He mentions that he has diarrhea every time he eats, had about 20 episodes of watery diarrhea since yesterday. Afebrile. No other sick contacts.   The patient was discharged home on Flagyl and azithromycin for his C. difficile and Campylobacter and is supposed to follow up with GI. He did see his primary care physician and was feeling well at that time. No medication changes were done.   PAST MEDICAL HISTORY: 1.  Diabetes mellitus.  2.  CAD.  3.  CVA.  4.  Sarcoidosis.  5.  Sleep apnea.  6.  Hypertension.  7.  CKD stage III.   PAST SURGICAL HISTORY: Appendectomy, right knee surgery, lung biopsy.   ALLERGIES: PENICILLIN.   SOCIAL HISTORY: The patient lives at home with his girlfriend. He quit smoking. Does not drink alcohol. No illicit drugs.   FAMILY HISTORY: Mom has bursitis, hypertension.   HOME MEDICATIONS: aspirin 81 mg oral delayed release tablet    1 tab(s) orally once a day    Proventil HFA CFC free 90 mcg/inh inhalation aerosol    2 puff(s) inhaled 4 times a day, As Needed    Catapres-TTS-2 0.2 mg/24 hr transdermal film, extended release    1 patch transdermal once a week    Advair Diskus 100 mcg-50 mcg inhalation powder    1  puff(s) inhaled 2 times a day    Nitrostat 0.4 mg sublingual tablet    1 tab(s) sublingual every 5 minutes, As Needed    atorvastatin 40 mg oral tablet    1 tab(s) orally once a day (at bedtime)    Lopressor 100 mg oral tablet    1 tab(s) orally 2 times a day    amLODIPine 10 mg oral tablet    1 tab(s) orally once a day      REVIEW OF SYSTEMS:    CONSTITUTIONAL: Complains of fatigue, weakness.  EYES: No blurred vision, pain, redness. EARS, NOSE, THROAT: No tinnitus, ear pain, hearing loss.  RESPIRATORY: No cough, wheeze, hemoptysis. Has COPD.  CARDIOVASCULAR: No chest pain, orthopnea, edema.  GASTROINTESTINAL: No nausea, vomiting, but does have diarrhea and abdominal pain.  GENITOURINARY: No dysuria, hematuria, frequency.  ENDOCRINE: No polyuria, nocturia, thyroid problems.  HEMATOLOGIC AND LYMPHATIC: No anemia, easy bruising, bleeding.  INTEGUMENTARY: No acne, rash, lesion.  MUSCULOSKELETAL: No back pain, arthritis.  NEUROLOGIC: No focal numbness, weakness, seizure.  PSYCHIATRIC: No anxiety, depression.   PHYSICAL EXAMINATION: VITAL SIGNS: Temperature 97.9, pulse 6103, blood pressure 120/64, saturating 96% on room air.  GENERAL: Obese African American male patient lying in bed, overall seems comfortable, conversational, cooperative with exam.  PSYCHIATRIC: Alert and oriented x 3. Mood and affect are appropriate. Judgment intact.  HEENT: Atraumatic, normocephalic. Oral mucosa dry and pink. No ulcers or thrush. No pallor. No icterus.  NECK: Supple. No thyromegaly or palpable lymph nodes. Trachea midline. No carotid bruit, JVD.  CARDIOVASCULAR: S1, S2, tachycardic without any murmurs. Peripheral pulses 2+. No edema.  RESPIRATORY: Normal work of breathing. Clear to auscultation on both sides.  GASTROINTESTINAL: Soft abdomen, nontender. Bowel sounds present. No hepatosplenomegaly palpable.  SKIN: Warm and dry. No petechiae, rash, ulcer.  MUSCULOSKELETAL: No joint swelling,  redness, effusion of large joints. Normal muscle tone.  NEUROLOGICAL: Motor strength 5/5 in upper and lower extremities. Sensation to fine touch intact all over. LYMPHATIC: No cervical lymphadenopathy.   LABORATORY DATA: Glucose 109, BUN 13, creatinine 1.33, sodium 137, potassium 3.5. AST, ALT, alkaline phosphatase, bilirubin normal. WBC 15.9, hemoglobin 13, platelets of 234.   C. difficile positive.   Urinalysis shows no bacteria, no WBCs.   ASSESSMENT AND PLAN: 1.  Recurrent Clostridium difficile infection: He has been recently treated with some Flagyl and azithromycin for Clostridium difficile and Campylobacter infection. We will start the patient on vancomycin orally at this time. Fluid resuscitate him, as he does have dehydration, We will consult gastroenterology for further input with the case with the recurrent Clostridium difficile infection.   2.  Diabetes mellitus: Continue home medications and sliding scale insulin.  3.  Hypertension: Continue medications.  4.  Chronic obstructive pulmonary disease: Stable. No wheezing.  5.  Deep vein thrombosis prophylaxis: Lovenox.   CODE STATUS: Full code.   Time spent today on this case was 55 minutes.   ____________________________ Leia Alf Wei Newbrough, MD srs:jcm D: 06/01/2013 16:00:48 ET T: 06/01/2013 16:28:45 ET JOB#: [id number]  cc: Alveta Heimlich R. Leonetta Mcgivern, MD, <Dictator> Dr. [facility] Neita Carp MD ELECTRONICALLY SIGNED 06/01/2013 17:21

## 2014-05-29 NOTE — H&P (Signed)
PATIENT NAME:  Corey, [name] MR#:  [id number] DATE OF BIRTH:  10/07/1970  DATE OF ADMISSION:  05/12/2013  PRIMARY CARE PHYSICIAN: Dr. Megan Salon at [facility].  CHIEF COMPLAINT: Abdominal pain and GI bleed.   HISTORY OF PRESENT ILLNESS: The patient is a 44 year old African American male with a past medical history of diabetes mellitus, hypertension, obstructive sleep apnea on CPAP at bedtime, chronic obstructive pulmonary disease, chronic renal insufficiency, coronary artery disease, recent history of stroke, sarcoidosis. Not on prednisone presently. Is presenting to the ER with a chief complaint of a 3-day history of lower abdominal pain associated with a dark-colored stool for the past 2 days. The patient denies any dizziness or loss of consciousness. Denies any nausea or vomiting. No similar complaints in the past. Never had any colonoscopy in the past. As patient is having abdominal pain the lower part of the abdomen associated with dark stool, he came into the ER. His girlfriend brought him into the ER and a CAT scan of the abdomen has revealed a possible infectious etiology versus inflammatory etiology. The patient's initial hemoglobin is at 12.1 and hematocrit is 32.7. INR is at 1.1. Hospitalist team made contact with the patient. Stool for occult blood  is positive. The patient denies any profuse bleeding.   PAST MEDICAL HISTORY: Coronary artery disease recently diagnosed, recent CVA, sarcoidosis, COPD, sleep apnea using CPAP at bedtime, hypertension, diabetes mellitus, chronic renal insufficiency.   PAST SURGICAL HISTORY: Appendectomy, right knee surgery, biopsy of the lung.   ALLERGIES:  PENICILLIN.   PSYCHOSOCIAL HISTORY: Lives at home with girlfriend. He used to smoke, but quit smoking. Denies alcohol or illicit drug usage.   FAMILY HISTORY: Mom has bursitis and hypertension.  HOME MEDICATIONS:  Aspirin 81 mg once daily, Advair 1 puff inhalation 2 times a day, Nitrostat  0.4 mg 1 tablet sublingually every 5 minutes, Magic mouthwash p.o. 4 times in a day, Proventil 2 puffs inhalation 4 times a day as needed for shortness of breath, Lopressor 100 mg p.o. 2 times a day, Catapres 0.2 mg 1 patch transdermally once a week, atorvastatin 40 mg p.o. once daily, aspirin 81 mg once daily.   REVIEW OF SYSTEMS:  CONSTITUTIONAL: Denies any fever, fatigue, weakness, or pain. Denies weight loss or weight gain.  EYES: Denies blurry vision. No lesions and glaucoma.  EARS, NOSE, AND THROAT: Denies epistaxis or discharge or hearing loss or allergies. RESPIRATION: Denies cough. Has chronic history of COPD.  CARDIOVASCULAR: No chest pain, palpitations.  Has history of coronary artery disease.  GASTROINTESTINAL: Denies nausea or vomiting. Complaining of dark tarry stool for the past 2 days. Also complaining of lower abdominal pain for 3 days. Denies hematemesis, melena.  GENITOURINARY: No dysuria, hematuria.  Though denies polyuria and nocturia, has diabetes mellitus.  LYMPHATIC: No anemia, easy bruising, bleeding.  INTEGUMENTARY: No acne, rash, lesions.  MUSCULOSKELETAL: No joint pain in the neck and back. Denies gout. Has sarcoidosis. NEUROLOGIC: Denies vertigo, ataxia, dementia.  Has a recent history of CVA.  PSYCHIATRIC: No ADD, OCD, or insomnia.   PHYSICAL EXAMINATION:  VITAL SIGNS: Temperature 97.6, pulse 70, respirations 18, blood pressure 164/84, pulse oximetry 99%.  GENERAL APPEARANCE: Not in acute distress. Moderately built and nourished.  HEENT: Normocephalic, atraumatic. Pupils are equal, reacting to light and accommodation. No scleral icterus. No conjunctival injection. No sinus tenderness. No postnasal drip and moist mucous membranes.  NECK: Supple. No JVD. No thyromegaly. Range of motion is intact. LUNGS: Clear to auscultation bilaterally. No accessory  muscle use and no anterior chest wall tenderness on palpation.  CARDIAC: S1, S2 normal. Regular rate and rhythm. No  murmurs.  GASTROINTESTINAL: Soft.  The bowel sounds are positive in all 4 quadrants. Generalized tenderness is present but the patient has more tenderness in the lower abdominal area. No rebound tenderness. No masses felt.  NEUROLOGIC: Awake, alert, oriented x 3. Motor and sensory grossly intact. Reflexes are 2+.  EXTREMITIES: No edema. No cyanosis. No clubbing.  SKIN: Warm to touch. Normal turgor. No rashes. No lesions.  MUSCULOSKELETAL: No joint effusion, tenderness, erythema. PSYCHIATRIC: Normal mood and affect.   LABORATORY AND IMAGING STUDIES: Glucose 114, BUN 13, creatinine 1.36, sodium 142, potassium 3.7, chloride 108, CO2 of 31. GFR is greater than 60. Anion gap 3, serum osmolality 284, calcium 8.2. Lipase is normal. LFTs normal, except albumin at 3.2. PT 13.9, INR 1.1, WBC 8.0, hemoglobin 12.1, hematocrit 36.7, platelets are 224. Stool for occult blood is positive. Urinalysis yellow in color, clear in appearance. Glucose, bilirubin, and ketones are negative. Nitrites and leukocyte are negative. A CAT scan of the abdomen and pelvis with contrast has revealed the question of mild mucosal edema  along the ascending colon, though this may simply reflect the lateral decompression.   Given the patient's symptoms, a mild infectious uninhibited process might have such an appearance. Trace pericholecystic fluid noticed of uncertain significance. Gallbladder otherwise appears unremarkable.   Suggestion of mild bilateral renal atrophy with scattered small bilateral renal cysts.   Bilateral atelectasis or scarring is noticed with a focal bronchiectasis at the right lung base.   ASSESSMENT AND PLAN: A 44 year old African American male presenting with a 3-day history of lower abdominal pain and dark fecal-occult-blood positive stool for 2 days. He will be admitted with the following assessment and plan.  1. Acute lower abdominal pain with probable upper GI bleed. We will keep him n.p.o., provide him IV  fluids.  2. IV Protonix. GI consult is placed to the on-call GI.  3. Monitor hemoglobin and hematocrit q. 6 hours.  4. Type and screen the blood. 5. History of sarcoidosis. The patient was on prednisone which was discontinued by PCP recently.  6. History of coronary artery disease. Aspirin is on hold in view of GI bleed. Continue beta blocker and statin.  7. History of stroke. Aspirin is on hold.  8. Hypertension. We will resume his home medications; metoprolol and amlodipine. Hold ACE inhibitor in view of renal insufficiency.  9. Diabetes mellitus. The patient being n.p.o.  We will put him on sliding scale insulin.  10. Chronic renal insufficiency. The patient has CT abdomen with contrast. We will monitor renal function closely.  We will avoid nephrotoxins. ACE inhibitors are on hold.  We will provide gastrointestinal prophylaxis with a PPI and deep venous thrombosis prophylaxis with SCDs.   Plan of care discussed with the patient. He is in agreement with the plan.   TOTAL TIME SPENT ON ADMISSION: 50 minutes.    ____________________________ Nicholes Mango, MD ag:dd D: 05/12/2013 02:29:01 ET T: 05/12/2013 04:26:02 ET JOB#: [id number]  cc: Nicholes Mango, MD, <Dictator>  Nicholes Mango MD ELECTRONICALLY SIGNED 05/31/2013 8:15

## 2014-05-29 NOTE — H&P (Signed)
PATIENT NAME:  [name], [name] MR#:  [id number] DATE OF BIRTH:  Aug 03, 1970  DATE OF ADMISSION:  02/23/2013  REFERRING PHYSICIAN: Charlesetta Ivory, MD  PRIMARY CARE PHYSICIAN: The patient currently following at [facility].  PULMONOLOGIST: Devona Konig, MD  HISTORY OF PRESENT ILLNESS: This is a 44 year old male with significant past medical history of sarcoidosis, borderline diabetes, and hypertension who presents with complaints of chest pain. The patient reports his pain happened while he was driving [place] from the beach today, midsternal, nonradiating, sudden onset, dull quality, accompanied by mild shortness of breath, but reports he has baseline shortness of breath as he has COPD and sarcoidosis. The patient reports he has been having cough for the last 2 weeks as well. Upon presentation the patient's troponins were elevated at 0.15, and he had some EKG changes significant for right bundle branch block. The patient's pain resolved with nitro. He received 324 mg of aspirin and received Lovenox 1 mg/k for non-ST-elevated MI. Currently, the patient is chest pain-free. Hospitalist service was requested to admit the patient.   PAST MEDICAL HISTORY: 1.  Sarcoidosis.  2.  Sleep apnea.  3.  Hypertension.  4.  COPD. 5.  Borderline diabetes.   PAST SURGICAL HISTORY: 1.  Appendectomy.  2.  Right knee arthroscopy. 3.  Biopsy of the lung.   HOME MEDICATIONS: 1.  Metoprolol 50 mg b.i.d.  2.  Amlodipine 10 mg daily.  3.  Combivent as needed.  4.  Metformin 500 mg oral daily.   SOCIAL HISTORY: Smokes 4 cigarettes per day. No alcohol. No drug use.   ALLERGIES: PENICILLIN.   FAMILY HISTORY: Significant for diabetes and hypertension.   REVIEW OF SYSTEMS: CONSTITUTIONAL: The patient denies fever, chills, fatigue, weakness, weight gain, weight loss.  EYES: Denies blurry vision, double vision, inflammation, glaucoma.  ENT: Denies tinnitus, ear pain, hearing loss, epistaxis, or discharge.   RESPIRATORY: Denies cough, wheezing, hemoptysis. Complains of dyspnea at baseline.  CARDIOVASCULAR: Complains of chest pain, currently resolved. No edema, palpitations, syncope.  GASTROINTESTINAL: Denies nausea, vomiting, diarrhea, abdominal pain, hematemesis.  GENITOURINARY: Denies dysuria, hematuria, renal colic.  ENDOCRINE: Denies polyuria, polydipsia, heat or cold intolerance.  HEMATOLOGY: Denies anemia, easy bruising, bleeding diathesis.  INTEGUMENTARY: Denies acne, rash or skin lesion.  MUSCULOSKELETAL: Denies any joint effusion or erythema, any sweating, gout or cramps.  NEUROLOGIC: Denies CVA, TIA, headache, dementia,  PSYCHIATRIC: Denies anxiety, insomnia, bipolar disorder, or schizophrenia.   PHYSICAL EXAMINATION: VITAL SIGNS: Temperature 98.5, pulse 99, respiratory rate 24, blood pressure 180/104, saturating 92% on oxygen.  GENERAL: Morbidly obese male who looks comfortable in bed, in no apparent distress.  HEENT: Head atraumatic, normocephalic. Pupils equal and reactive to light. Pink conjunctivae. Anicteric sclerae. Moist oral mucosa.  NECK: Supple. No thyromegaly. No JVD.  CHEST: Good air entry bilaterally. No wheezing, rales, or rhonchi.  CARDIOVASCULAR: S1, S2 heard. No rubs, murmurs, or gallops.  ABDOMEN: Obese, soft, nontender, nondistended. Bowel sounds present.  EXTREMITIES: No edema. No clubbing. No cyanosis.  PSYCHIATRIC: Appropriate affect. Awake and alert x3. Intact judgment and insight.  NEUROLOGIC: Cranial nerves grossly intact. Motor 5/5. No focal deficits.  SKIN: Normal skin turgor. Warm and dry. LYMPHATICS: No cervical lymphadenopathy could be appreciated.   PERTINENT LABORATORY AND DIAGNOSTICS: Glucose 232. BNP 320. BUN 12, creatinine 1.39, sodium 141, potassium 4.1, chloride 103, CO2 37. Troponin 0.15. White blood cells 6.6, hemoglobin 15.5, hematocrit 47.2, platelets 193.  EKG showing normal sinus rhythm at 99 beats with right bundle branch block and left  anterior fascicular block.   ASSESSMENT AND PLAN: 1.  Non-ST-elevated myocardial infarction. The patient presented with chest pain, EKG changes, positive troponins. He will be treated for non-ST-elevated myocardial infarction. Already received 1 dose of Lovenox 1 mg/k. Received 324 of aspirin and will be continued on aspirin and on p.r.n. nitro. We will admit to telemetry. Continue to cycle his cardiac enzymes. Consult cardiology. So far ordered only 1 dose of Lovenox. Pending if the patient is going for cardiac catheter in a.m. or not.  2.  Hypertension. Blood pressure elevated. Continue with Norvasc and metoprolol. Will add p.r.n. hydralazine.  3.  Diabetes mellitus. Hold metformin. Continue on insulin sliding scale.  4.  History of chronic obstructive pulmonary disease. The patient has no wheezing. Continue with DuoNebs and p.r.n. albuterol.  5.  Deep vein thrombosis prophylaxis. The patient received 1 treatment dose of Lovenox.   CODE STATUS: FULL code.   TOTAL TIME SPENT ON ADMISSION AND PATIENT CARE: 50 minutes.  ____________________________ Albertine Patricia, MD dse:sb D: 02/23/2013 02:48:24 ET T: 02/23/2013 09:48:01 ET JOB#: [id number]  cc: Albertine Patricia, MD, <Dictator> Rickayla Wieland Graciela Husbands MD ELECTRONICALLY SIGNED 02/25/2013 1:35

## 2014-05-29 NOTE — Consult Note (Signed)
PATIENT NAME:  [name], [name] MR#:  [id number] DATE OF BIRTH:  03/27/1970  DATE OF CONSULTATION:  02/23/2013  REFERRING PHYSICIAN:  Loletha Grayer, MD CONSULTING PHYSICIAN:  Isaias Cowman, MD  CHIEF COMPLAINT: Chest pain.   REASON FOR CONSULTATION: Evaluation of chest pain and elevated troponin.   HISTORY OF PRESENT ILLNESS: The patient is a 44 year old gentleman with history of hypertension, diabetes, COPD, and sarcoidosis. He was in his usual state of health until the day of admission when he experienced substernal chest pain described as tightness with radiation to his left arm. The patient was sent to [facility] Emergency Room where EKG revealed sinus rhythm with right bundle branch block. Admission labs were notable for elevated troponin of 0.19. The patient currently denies chest pain.   PAST MEDICAL HISTORY: 1.  Hypertension.  2.  Diabetes.  3.  COPD. 4.  Sarcoidosis.   MEDICATIONS:  1.  Metoprolol succinate 2 tabs b.i.d. 2.  Metformin 500 mg b.i.d. 3.  Combivent inhaler 2 puffs b.i.d.  4.  Amlodipine 1 daily.   SOCIAL HISTORY: The patient is going through a divorce. He currently denies tobacco abuse.   FAMILY HISTORY: No immediate family history of coronary artery disease or myocardial infarction.   REVIEW OF SYSTEMS: CONSTITUTIONAL: No fever or chills.  EYES: No blurry vision.  EARS: No hearing loss.  RESPIRATORY: The patient has some chronic exertional dyspnea.  CARDIOVASCULAR: The patient has chest pain as described above.  GASTROINTESTINAL: No nausea, vomiting, diarrhea, or constipation.  GENITOURINARY: No dysuria or hematuria.  ENDOCRINE: No polyuria or polydipsia.  MUSCULOSKELETAL: No arthralgias or myalgias.  NEUROLOGICAL: No focal muscle weakness or numbness.  PSYCHOLOGICAL: No depression or anxiety.   PHYSICAL EXAMINATION: VITAL SIGNS: Blood pressure 156/110, pulse 92, respirations 19, temperature 98.3, pulse ox 96%.  HEENT: Pupils equal and reactive to  light and accommodation.  NECK: Supple without thyromegaly.  LUNGS: Clear.  HEART: Normal JVP. Normal PMI. Regular rate and rhythm. Normal S1 and S2. No appreciable gallop, murmur, or rub.  ABDOMEN: Soft and nontender. Pulses were intact bilaterally.  MUSCULOSKELETAL: Normal muscle tone.  NEUROLOGIC: The patient is alert and oriented x3. Motor and sensory are both grossly intact.   IMPRESSION: A 44 year old gentleman with multiple cardiovascular risk factors, new onset chest pain at rest, with a borderline elevated troponin consistent with unstable angina and acute coronary syndrome.   RECOMMENDATIONS: 1.  Agree with overall current therapy.  2.  Proceed with cardiac catheterization with selective coronary arteriography today on 02/23/2013. The risks, benefits, and alternatives were explained and informed written consent obtained.  ____________________________ Isaias Cowman, MD ap:sb D: 02/23/2013 08:44:46 ET T: 02/23/2013 11:22:32 ET JOB#: [id number]  cc: Isaias Cowman, MD, <Dictator> Isaias Cowman MD ELECTRONICALLY SIGNED 03/24/2013 12:29

## 2014-05-29 NOTE — Discharge Summary (Signed)
PATIENT NAME:  [name], [name] MR#:  [id number] DATE OF BIRTH:  [birth year]  DATE OF ADMISSION:  02/23/2013 DATE OF DISCHARGE:  02/26/2013  ADMITTING DIAGNOSIS: Chest pain.   DISCHARGE DIAGNOSES:  1. Chest pain due to single-vessel coronary artery disease with good collaterals, medical management is recommended, with occluded posterior descending artery with collateralization from the left circumflex.  2. 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  3. Sarcoidosis/chronic obstructive pulmonary disease with acute respiratory failure, requiring some breathing treatments and intravenous steroids.  4. Sleep apnea.  5. Hypertension.  6. Borderline diabetes.  7. Elevated creatinine, possibly due to chronic renal failure.  8. Status post appendectomy.  9. Status post right knee arthroscopy.  10. Status post biopsy of the lung.   PERTINENT LABORATORIES AND EVALUATIONS: Admitting BMP: Glucose was 232. BNP was 320. BUN 12, creatinine 1.39, sodium 141, potassium 4.1, chloride 103, CO2 was 37, calcium 8.4. CPK was (Dictation Anomaly) , then subsequently (Dictation Anomaly)  and then 4.93. Troponin was 0.15, 0.19 and 0.18. WBC 6.6, hemoglobin 15.5, platelet count was 193. Cardiac catheterization showed 1-vessel coronary artery disease with occluded PDA, collateralized from AV groove, left circumflex. Echocardiogram of the heart showed no thrombus, severely increased left ventricular posterior wall thickness, mild mitral valve regurg and mild to moderately increased left ventricular internal cavity size, basal and mild inferior septal abnormality, moderate LVH. MRI of the brain showed a small acute infarct involving the right corona radiata and posterior putamen. Ultrasound carotid Dopplers bilaterally showed normal carotid arteries.   HOSPITAL COURSE: Please refer to H and P done by the admitting physician. The patient is a 44 year old morbidly obese male  with history of chronic respiratory failure. He was noncompliant with his O2 and was not wearing it for about a year. He presented with complaint of chest pain. Initially, his complaint was chest pain. He underwent evaluation with cardiac catheterization. Results are as above. He was recommended to be treated medically for his obstructive coronary artery disease. His chest pain was resolved. However, the day he had his catheterization, he stated that he has been having slurred speech since admission, so he underwent evaluation for a possible CVA, and his CT scan did confirm a possible CVA. Therefore, he underwent an MRI and carotid Dopplers. Was seen by neurology. He was also seen by PT. In terms of his slurred speech, it is improved. The patient also has history of chronic respiratory failure and was recommended to use O2; however, the patient took himself off the O2 because he stated that it was costing too much. During the hospitalization, he was noted to have coughing, shortness of breath and wheezing. He was treated with nebulizers, antibiotics and steroids, with much improvement in his symptoms. I strongly recommended the patient be compliant with oxygen that is being set up and his medications. At this time, he is stable for discharge.   DISCHARGE MEDICATIONS:  1. Combivent 2 puffs 4 times a day. 2. Atorvastatin 40 at bedtime.  3. Aspirin 81 mg 1 tab p.o. daily. 4. Plavix 75 p.o. daily. 5. Lopressor 100 mg 1 tab p.o. b.i.d. 6. Amlodipine 10 daily.  7. Lisinopril 40 daily.  8. Glipizide 5 mg 1 tab p.o. b.i.d. 9. Levaquin 500 mg 1 tab p.o. q.24 hours for 4 days.  10. Prednisone taper starting at 60 taper by 10 until complete.  11. Advair 250/50 one puff b.i.d.  12. O2 at 3 liters via nasal cannula.  DIET: Low sodium, low fat, low cholesterol, carbohydrate-controlled diet.   ACTIVITY: As tolerated.   FOLLOWUP: With primary MD in 1 to 2 weeks. Follow with [facility] in 4 to 6 weeks. Follow  with Dr. [name] in 2 to 4 weeks.   NOTE: 35 minutes spent on the discharge.    ____________________________ Lafonda Mosses. Posey Pronto, MD shp:lb D: 02/27/2013 08:28:30 ET T: 02/27/2013 09:04:20 ET JOB#: [id number]  cc: Vallery Mcdade H. Posey Pronto, MD, <Dictator> Alric Seton MD ELECTRONICALLY SIGNED 02/27/2013 17:10

## 2014-05-29 NOTE — Consult Note (Signed)
Chief Complaint:  Subjective/Chief Complaint Please see full Gi consult and brief consult note.  Patient admitted with acute onset of abdominal pain and diarrheal illness.  CT showing posible colitis, black stool yesterday, heme positive.  Brown stool earlier today.   Recent prolonged hospitalization in the settting of multiple medical issues including CVA, HTN, DM, CKD, MI(Jan 2015), history of sarcoisosis.  Denies upper abdominal pain or n/v, positive central abdominal discomfort.  Difficult case in that he has colitis by CT which couold cause heme positive stool, though likely not black, and black stool though not epigastric pain.  He has been hemodynamically stable and hgb stable without elevation of the BUN that would be consistant with UGI bleeding.   Recommend EGD when clinically feasible.  I will need to discuss with PMD in am, scheduled for pm tomorrow.  Continue iv ppi.  Following.   VITAL SIGNS/ANCILLARY NOTES: **Vital Signs.:   07-Apr-15 17:49  Vital Signs Type Q 4hr  Temperature Temperature (F) 98.2  Celsius 36.7  Temperature Source oral  Pulse Pulse 68  Respirations Respirations 18  Systolic BP Systolic BP 024  Diastolic BP (mmHg) Diastolic BP (mmHg) 81  Mean BP 98  Pulse Ox % Pulse Ox % 98  Pulse Ox Activity Level  At rest  Oxygen Delivery Room Air/ 21 %   Brief Assessment:  Cardiac Regular   Respiratory clear BS   Gastrointestinal details normal Soft  Nondistended  Bowel sounds normal  No gaurding  mild discomfort lower abd/ central abdomen   Lab Results: Routine Chem:  07-Apr-15 16:13   Result Comment WBC, STOOL - SPECIMEN GREATER THAN 1 HOUR OLD.  - CALLED TO BRANDY KELLEY AT [place] 05/12/13  - TO RECOLLECT.   Radiology Results: CT:    06-Apr-15 23:03, CT Abdomen and Pelvis With Contrast  CT Abdomen and Pelvis With Contrast   REASON FOR EXAM:    (1) Lower abd pain; (2) Lower abd pain  COMMENTS:       PROCEDURE: CT  - CT ABDOMEN / PELVIS  W  - May 11 2013  11:03PM     CLINICAL DATA:  Mid abdominal pain; dark stools.    EXAM:  CT ABDOMEN AND PELVIS WITH CONTRAST    TECHNIQUE:  Multidetector CT imaging of the abdomen and pelvis was performed  using the standard protocol following bolus administration of  intravenous contrast.  CONTRAST:  125 mL of Isovue 370 IV contrast    COMPARISON:  Renal ultrasound performed 03/01/2013    FINDINGS:  Bibasilar atelectasis or scarring is noted, with focal  bronchiectasis at the right lung base.    The liver and spleen are unremarkable in appearance. Trace  pericholecystic fluid is noted, of uncertain significance. The  gallbladder is otherwise unremarkable. The pancreas and adrenal  glands are unremarkable.    There is suggestion of mild bilateral renal atrophy, with scattered  small bilateral renal cysts measuring up to 1.3 cm in size. There is  no evidence of hydronephrosis. No renal or ureteral stones are seen.  No significant perinephric stranding is appreciated.    No free fluid is identified. The small bowel is unremarkable in  appearance. The stomach is within normal limits. No acute vascular  abnormalities are seen.    The patient is status post appendectomy. There is question of mild  mucosal edema along the ascending colon, though this may simply  reflect relative decompression.    The bladder is mildly distended and grossly unremarkable. The  prostate remains normal in size. No inguinal lymphadenopathy is  seen.  No acute osseous abnormalities are identified.     IMPRESSION:  1. Question of mild mucosal edema along the ascending colon, though  this may simply reflect relative decompression. Given the patient's  symptoms, a mild infectious or inflammatory process might have such  an appearance.  2. Suggestion of mild bilateral renal atrophy, with scattered small  bilateral renal cysts.  3. Bibasilar atelectasis or scarring noted, with focal  bronchiectasis at the right lung  base.  4. Trace pericholecystic fluid noted, of uncertain significance.  Gallbladder otherwise unremarkable in appearance.    Electronically Signed    By: Garald Balding M.D.    On: 05/11/2013 23:39         Verified By: JEFFREY .Radene Knee, M.D.,   Assessment/Plan:  Assessment/Plan:  Assessment as above.  following.   Electronic Signatures: Loistine Simas (MD)  (Signed 07-Apr-15 19:34)  Authored: Chief Complaint, VITAL SIGNS/ANCILLARY NOTES, Brief Assessment, Lab Results, Radiology Results, Assessment/Plan   Last Updated: 07-Apr-15 19:34 by Loistine Simas (MD)

## 2014-05-29 NOTE — Consult Note (Signed)
PATIENT NAME:  [name], [name] MR#:  [id number] DATE OF BIRTH:  [birth year]  DATE OF CONSULTATION:  06/02/2013  REFERRING PHYSICIAN:  Dr. Darvin Neighbours. CONSULTING PHYSICIAN:  Andria Meuse, NP and Lucilla Lame, M.D.  PRIMARY CARE PHYSICIAN:  Dr. Megan Salon.   PRIMARY GASTROENTEROLOGIST:  Dr. Gustavo Lah.   REASON FOR CONSULTATION:  Recurrent C. diff.   HISTORY OF PRESENT ILLNESS: [name] is a pleasant 44 year old male who was admitted with recurrent C. diff. He was actually hospitalized here at [facility] and discharged on April 10th with Campylobacter jejuni and C. diff. He has been treated with azithromycin and Flagyl for a total of 14 days after the azithromycin was discontinued. He was also placed on Florastor. He states his diarrhea did improve; however, 2 days ago, he began to have greater than 20 loose bowel movements per day. He did not see any rectal bleeding. He denies any abdominal pain. He denies any nausea or vomiting. Despite his diarrhea, his appetite has remained well. His white blood cell count was 15.9 on admission, down to 7.1 today. His hemoglobin is 11.9. LFTs and his met-7 is normal except for glucose of 129 and calcium of 8.3. He has been placed on vancomycin 250 q.i.d. and Flagyl 500 mg t.i.d. He has never had a colonoscopy.   PAST MEDICAL AND SURGICAL HISTORY: C. diff colitis, diabetes mellitus, coronary artery disease, CVA, sarcoidosis, sleep apnea, hypertension, chronic kidney disease stage III, appendectomy, right knee surgery, lung biopsy.   MEDICATIONS PRIOR TO ADMISSION: Advair Diskus 100 mcg/50 mcg 1 puff b.i.d., amlodipine 10 mg daily, aspirin 81 mg daily, atorvastatin 40 mg at bedtime, Catapres-TTS-2, 0.02 mg per 24 hours transdermal film extended release 1 patch weekly, Lopressor 100 mg b.i.d., Nitrostat 0.4 mg q.5 minutes p.r.n., Proventil HFA CFC-free 90 mcg inhalation 2 puffs q.i.d. p.r.n.   ALLERGIES: PENICILLIN CAUSES NAUSEA AND VOMITING.   SOCIAL HISTORY:   [name] lives with his girlfriend and her 4 children. He has 1 grown healthy daughter. He has a remote history of tobacco use. Denies any alcohol or illicit drug use. He is disabled.   FAMILY HISTORY:  There is no known family history of colorectal carcinoma, liver or chronic GI problems.   REVIEW OF SYSTEMS:  See HPI, otherwise negative 12-point review of systems.     PHYSICAL EXAMINATION: VITAL SIGNS:  Temperature 98.7, pulse 73, respirations 16, blood pressure 121/79, O2 sat 96% on room air. Weight is 277 pounds. BMI is 36.6.  GENERAL:  He is well-developed, well-nourished black male who is alert, oriented, pleasant, cooperative, in no acute distress.  HEENT:  Sclerae clear, anicteric.  Conjunctivae pink. Oropharynx pink and moist without any lesions.  NECK:  Supple without mass or thyromegaly.  CHEST: Heart regular rate and rhythm. Normal S1, S2. No murmurs, clicks, rubs or gallops.  LUNGS:  Clear to auscultation bilaterally.  ABDOMEN:  Positive bowel sounds x 4. No bruits auscultated. Abdomen is soft, nondistended, nontender without palpable mass or hepatosplenomegaly. No rebound tenderness or guarding.  EXTREMITIES:  With trace pretibial and pedal edema bilaterally. He has good pulses. No cyanosis or clubbing.  NEUROLOGIC:  Grossly intact.  MUSCULOSKELETAL:  Good equal movement and strength bilaterally.  PSYCHIATRIC:  Alert, cooperative. Normal mood and affect.   LABORATORY STUDIES:  See HPI.   IMPRESSION:  [name] is a pleasant 44 year old black male with first recurrence of Clostridium diff colitis. He was discharged on April 10th on azithromycin for Campylobacter and Flagyl for 14 days for  Clostridium diff after completion of the azithromycin. He did complete his medications and the diarrhea resolved until 2 days ago when began was having upwards of 20+ loose stools per day. His Clostridium diff PCR is positive. He will need vancomycin for severe Clostridium diff given his  admission leukocytosis was greater than 15,000.   PLAN: 1.  Vancomycin 125 mg p.o. q.i.d. for 14 days.  2.  Discontinue Flagyl.  3.  Discontinue Florastor.   4.  Minimize antibiotics if at all possible, and he should contact Dr. Gustavo Lah if he is placed on antibiotic therapy in the future.  5.  Discussed use of hand washing and bleach for surfaces.  6.  If no improvement, consider fecal transplant.  7. He will need a complete colonoscopy after resolution of his C. diff by Dr. Gustavo Lah.   We would like to thank you for allowing [place] to participate in his care.    ____________________________ Andria Meuse, NP klj:dmm D: 06/02/2013 10:28:41 ET T: 06/02/2013 10:45:00 ET JOB#: [id number]  cc: Andria Meuse, NP, <Dictator> Christian Mate. Megan Salon, [place] ELECTRONICALLY SIGNED 06/16/2013 11:09

## 2014-05-29 NOTE — Discharge Summary (Signed)
PATIENT NAME:  [name], [name] MR#:  [id number] DATE OF BIRTH:  1970-05-13  DATE OF ADMISSION:  06/01/2013  DATE OF DISCHARGE:  06/03/2013  CONSULTANTS:NP   Vickey Huger,  PCP with [facility].  CHIEF COMPLAINT:  Abdominal pain.  DISCHARGE DIAGNOSES: 1.  Recurrent Clostridium difficile.  2.  Sepsis on arrival, now resolved.  3.  Mild acute renal insufficiency, normalized. 4.  History of chronic obstructive pulmonary disease.  5.  Hypertension.  6.  History of myocardial infarction.  7.  History of transient ischemic attack.  8.  Hyperlipidemia.   DISCHARGE MEDICATIONS: Aspirin 81 mg daily, Proventil 2 puffs 4 times a day as needed, Catapres 0.2 mg per 24 hour, 1 patch once a week, Advair 100 mcg/50 mcg 1 puff 2 times a day, Nitrostat 0.4 mg sublingual every 5 minutes as needed for chest pain, atorvastatin 40 mg daily at bedtime, Lopressor 100 mg 2 times a day, amlodipine 10 mg daily, vancomycin 250 mg per 5 mL oral solution, take 2.5 mL every 6 hours for 12 more days.   INSTRUCTIONS: Diet: Low sodium, low fat, low cholesterol. Activity as tolerated. Please follow with PCP and GI within 1 to 2 weeks.   DISPOSITION: Home.   SIGNIFICANT LABS AND IMAGING: C. diff  positive, initial BUN 13, creatinine 1.33, last creatinine of 1.08. Initial white count of 15.9, last white count of 7.1.   HISTORY OF PRESENT ILLNESS AND HOSPITAL COURSE:  For full details of H and P, please see the dictation on April 27 by Dr. Darvin Neighbours, but briefly, this is a pleasant, 44 year old with recent C. diff and Campylobacter jejuni infection, who came in again with severe abdominal pain, diarrhea. He was apparently fine until 4 days before admission, when the diarrhea started. Of note, he had been treated with Flagyl and azithromycin for his previous infections, and discharged. Here, he was noted to have C. diff again, positive on stool cultures, and was admitted to the hospitalist service at this time with oral  vancomycin. He did have sepsis per criteria on admission, with leukocytosis and tachycardia, but that has resolved. His white count is down. His stool is more formed. He did also have mild acute renal insufficiency without acute failure. This was likely secondary to above, and volume loss from diarrhea. That has resolved with IV fluids and treatment of the C. diff. At this point, he will be discharged with outpatient followup with 12 more days of vancomycin.   PHYSICAL EXAMINATION: VITAL SIGNS: On the day of discharge, temperature is 98, pulse rate 66, respiratory rate 20, blood pressure 111/71, O2 sat 95%.  GENERAL: The patient is an obese male lying in bed. HEART:  Normal S1, S2.  ABDOMEN: Soft, nontender. Normoactive bowel sounds.  LUNGS: Clear.   Total time spent is about 40 minutes.   The patient is a FULL CODE.   ____________________________ Vivien Presto, MD sa:mr D: 06/03/2013 16:08:08 ET T: 06/03/2013 20:55:55 ET JOB#: [id number]  cc: Vivien Presto, MD, <Dictator> Lollie Sails, MD DR. [place] MD ELECTRONICALLY SIGNED 06/04/2013 14:38

## 2014-05-29 NOTE — Op Note (Signed)
PATIENT NAME:  TAHJAE, CLAUSING MR#:  [id number] DATE OF BIRTH:  [birth year]  DATE OF PROCEDURE:  03/09/2013  PREOPERATIVE DIAGNOSES:  1.  Vent dependent trach. 2.  Prolonged intubation.  POSTOPERATIVE DIAGNOSES:  1.  Vent dependent trach. 2.  Prolonged intubation.  PROCEDURE: Tracheostomy.   SURGEON: Margaretha Sheffield, M.D.   ANESTHESIA: General.   COMPLICATIONS: None.   DESCRIPTION OF PROCEDURE: The patient was brought to the operating room from the ICU, was given general anesthesia through previous oral endotracheal tube as well as IVs. A shoulder roll was placed, the neck was extended, and the neck was washed and then marked, and 2 mL of 1% Xylocaine with epinephrine 1:100,000 was used in the soft tissues, in the anterior neck. He was prepped and draped in sterile fashion and then an incision was created through the wound, approximately 1 inch long. A small ball of fat was removed from the subcutaneous tissues right here. Electrocautery was used for that. The strap muscles were found and divided in the midline. There was an anterior jugular vein that was adjacent to the left side and just pushed to the left. Once the strap muscles were divided you could see a fairly large isthmus starting just below the cricoid. It was about 1 inch in length and fairly thick. I used the Harmonic scalpel for cutting through this and cauterizing the edges. Once this was divided you could easily see the trachea, in the pretracheal space. The incision was made between the second and third tracheal rings down into the trachea. An upside down U-shaped flap was created and the flap was sutured to the inferior soft tissue. The trach tube was wide open and we were just below the cuff of the orotracheal tube. A lot of thick mucus was suctioned out. The trach tube was pulled back and a #8 Shiley DCT tube was placed. The cuff was inflated. Oxygenation was good. This was then secured around the neck. Then 3-0 nylon sutures were  used on both sides and a trach dressing was applied. A trach dressing was placed as well. The patient tolerated the procedure well. He was awakened and taken to the recovery room in satisfactory condition. There were no operative complications.  ____________________________ Huey Romans, MD phj:sb D: 03/09/2013 09:54:04 ET T: 03/09/2013 11:48:03 ET JOB#: [id number]  cc: Huey Romans, MD, <Dictator> Huey Romans MD ELECTRONICALLY SIGNED 03/10/2013 8:23

## 2014-05-29 NOTE — Consult Note (Signed)
Chief Complaint:  Subjective/Chief Complaint Pt denies diarrhea, abdominal pain, nausea or vomiting..  Tolerating regular diet well.  "I feel good & want to go home"   VITAL SIGNS/ANCILLARY NOTES: **Vital Signs.:   29-Apr-15 05:27  Vital Signs Type Routine  Temperature Temperature (F) 98  Celsius 36.6  Temperature Source oral  Pulse Pulse 66  Respirations Respirations 20  Systolic BP Systolic BP 284  Diastolic BP (mmHg) Diastolic BP (mmHg) 71  Mean BP 84  Pulse Ox % Pulse Ox % 95  Pulse Ox Activity Level  At rest  Oxygen Delivery Room Air/ 21 %   Brief Assessment:  GEN well developed, well nourished, no acute distress, A/Ox3   Cardiac Regular   Respiratory normal resp effort   Gastrointestinal Normal   Gastrointestinal details normal Soft  Nontender  Bowel sounds normal  No rebound tenderness  No gaurding  No rigidity   EXTR negative cyanosis/clubbing, negative edema   Additional Physical Exam Skin: warm, dry   Assessment/Plan:  Assessment/Plan:  Assessment 1st recurrence of c difficile colitis:  Much improved on vancomycin.   Plan 1) Vancomycin 125mg  PO QID for 14 days total 2) if no complete resolution consider fecal transplant 3) complete colonoscopy once c diff resolves Please call if you have any questions or concerns   Electronic Signatures: Andria Meuse (NP)  (Signed 29-Apr-15 10:20)  Authored: Chief Complaint, VITAL SIGNS/ANCILLARY NOTES, Brief Assessment, Assessment/Plan   Last Updated: 29-Apr-15 10:20 by Andria Meuse (NP)

## 2014-05-29 NOTE — Consult Note (Signed)
Chief Complaint:  Subjective/Chief Complaint seen for acute diarrheal illness and abdominal pain.  feeling some better today.   stools still loose, brown.  less abdominal pain no nausea.   VITAL SIGNS/ANCILLARY NOTES: **Vital Signs.:   08-Apr-15 06:27  Vital Signs Type Q 4hr  Temperature Temperature (F) 97.2  Celsius 36.2  Pulse Pulse 58  Respirations Respirations 18  Systolic BP Systolic BP 716  Diastolic BP (mmHg) Diastolic BP (mmHg) 76  Mean BP 94  Pulse Ox % Pulse Ox % 98  Pulse Ox Activity Level  At rest  Oxygen Delivery Room Air/ 21 %    14:02  Vital Signs Type Q 4hr  Pulse Pulse 75  Respirations Respirations 18  Systolic BP Systolic BP 967  Diastolic BP (mmHg) Diastolic BP (mmHg) 73  Mean BP 87  Pulse Ox % Pulse Ox % 96  Pulse Ox Activity Level  At rest  Oxygen Delivery Room Air/ 21 %  *Intake and Output.:   08-Apr-15 08:45  Stool  Stool per pt    12:15  Stool  Stool per pt   Brief Assessment:  Cardiac Regular   Respiratory clear BS   Gastrointestinal details normal Soft  Nondistended  No masses palpable  Bowel sounds normal  No rebound tenderness  mild tenderness to palpatieotn in the lower abdomen.   Lab Results: Routine Micro:  07-Apr-15 16:13   Micro Text Report CLOSTRIDIUM DIFFICILE   C.DIFFICILE ANTIGEN       C.DIFFICILE GDH ANTIGEN : POSITIVE   C.DIFFICILE TOXIN A/B     C.DIFFICILE TOXINS A AND/OR B: POSITIVE   INTERPRETATION            Positive for toxigenic C. difficile, active toxin   ANTIBIOTIC                      17:41   Culture Comment    . POSITIVE CAMPYLOBACTER AG (JEJUNI/COLI)  Routine Chem:  07-Apr-15 16:13   Result Comment WBC, STOOL - SPECIMEN GREATER THAN 1 HOUR OLD.  - CALLED TO BRANDY KELLEY AT [place] 05/12/13  - TO RECOLLECT.  Result Comment POSITIVE C. DIFF - NOTIFIED OF CRITICAL VALUE  - C/[facility] AT 1948 05/12/13.PMH  - READ-BACK PROCESS PERFORMED.  Result(s) reported on 12 May 2013 at 07:50PM.    17:41   Result  Comment POSITIVE CAMPY - CTJ TO GLENDA FIELDS @ 1441 ON 05-13-13  - READ-BACK PROCESS PERFORMED.  - FAXED TO HEALTH DEPARTMENT.  - PRINTED TO INFECTION CONTROL.  Result(s) reported on 13 May 2013 at 02:45PM.    21:57   Result Comment WBCS STOOL - WRONG SPECIMEN SENT. Sharol Harness [facility] AT 2224 05/12/13  08-Apr-15 04:34   Glucose, Serum  105  BUN 7  Creatinine (comp) 0.97  Sodium, Serum 139  Potassium, Serum  3.4  Chloride, Serum 106  CO2, Serum 27  Calcium (Total), Serum  8.3  Anion Gap  6  Osmolality (calc) 276  eGFR (African American) >60  eGFR (Non-African American) >60 (eGFR values <87m/min/1.73 m2 may be an indication of chronic kidney disease (CKD). Calculated eGFR is useful in patients with stable renal function. The eGFR calculation will not be reliable in acutely ill patients when serum creatinine is changing rapidly. It is not useful in  patients on dialysis. The eGFR calculation may not be applicable to patients at the low and high extremes of body sizes, pregnant women, and vegetarians.)  Routine Hem:  08-Apr-15 04:34  WBC (CBC) 7.1  RBC (CBC)  4.17  Hemoglobin (CBC)  11.6  Hematocrit (CBC)  34.2  Platelet Count (CBC) 217  MCV 82  MCH 27.9  MCHC 34.1  RDW  14.6  Neutrophil % 57.5  Lymphocyte % 23.5  Monocyte % 16.3  Eosinophil % 2.3  Basophil % 0.4  Neutrophil # 4.1  Lymphocyte # 1.7  Monocyte #  1.2  Eosinophil # 0.2  Basophil # 0.0 (Result(s) reported on 13 May 2013 at 05:12AM.)   Radiology Results: CT:    06-Apr-15 23:03, CT Abdomen and Pelvis With Contrast  CT Abdomen and Pelvis With Contrast   REASON FOR EXAM:    (1) Lower abd pain; (2) Lower abd pain  COMMENTS:       PROCEDURE: CT  - CT ABDOMEN / PELVIS  W  - May 11 2013 11:03PM     CLINICAL DATA:  Mid abdominal pain; dark stools.    EXAM:  CT ABDOMEN AND PELVIS WITH CONTRAST    TECHNIQUE:  Multidetector CT imaging of the abdomen and pelvis was performed  using the standard  protocol following bolus administration of  intravenous contrast.  CONTRAST:  125 mL of Isovue 370 IV contrast    COMPARISON:  Renal ultrasound performed 03/01/2013    FINDINGS:  Bibasilar atelectasis or scarring is noted, with focal  bronchiectasis at the right lung base.    The liver and spleen are unremarkable in appearance. Trace  pericholecystic fluid is noted, of uncertain significance. The  gallbladder is otherwise unremarkable. The pancreas and adrenal  glands are unremarkable.    There is suggestion of mild bilateral renal atrophy, with scattered  small bilateral renal cysts measuring up to 1.3 cm in size. There is  no evidence of hydronephrosis. No renal or ureteral stones are seen.  No significant perinephric stranding is appreciated.    No free fluid is identified. The small bowel is unremarkable in  appearance. The stomach is within normal limits. No acute vascular  abnormalities are seen.    The patient is status post appendectomy. There is question of mild  mucosal edema along the ascending colon, though this may simply  reflect relative decompression.    The bladder is mildly distended and grossly unremarkable. The  prostate remains normal in size. No inguinal lymphadenopathy is  seen.  No acute osseous abnormalities are identified.     IMPRESSION:  1. Question of mild mucosal edema along the ascending colon, though  this may simply reflect relative decompression. Given the patient's  symptoms, a mild infectious or inflammatory process might have such  an appearance.  2. Suggestion of mild bilateral renal atrophy, with scattered small  bilateral renal cysts.  3. Bibasilar atelectasis or scarring noted, with focal  bronchiectasis at the right lung base.  4. Trace pericholecystic fluid noted, of uncertain significance.  Gallbladder otherwise unremarkable in appearance.    Electronically Signed    By: Garald Balding M.D.    On: 05/11/2013 23:39          Verified By: JEFFREY .CHANG, M.D.,   Assessment/Plan:  Assessment/Plan:  Assessment 1)  acute diarrheal illness.  stool positive for campylobacter and c. diff., currently on po flagyl and now with added azithromycin.   The course of treatment for the c diff will have to be about a week longer than the zithromax, and tapered over about another 4-5 days.  EGD held due to the stool results and positive ct findings.   Plan  1) as above.  discussed wth Dr Earleen Newport.   Electronic Signatures: Loistine Simas (MD)  (Signed 08-Apr-15 15:58)  Authored: Chief Complaint, VITAL SIGNS/ANCILLARY NOTES, Brief Assessment, Lab Results, Radiology Results, Assessment/Plan   Last Updated: 08-Apr-15 15:58 by Loistine Simas (MD)

## 2014-05-29 NOTE — Consult Note (Signed)
Brief Consult Note: Diagnosis: abdominal pain, diarrhea.   Patient was seen by consultant.   Consult note dictated.   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 Patient reports onset of bilateral lower abdominal pain with diarrhea on Saturday: describes stools as black and loose, too many to count until today, of which he has had 4 or 5. NO fever or other GI complaints. No history of PUD.  Denies NSAIDs other than low dose ASA. Not on any other anticoagulation. Currently on iv abx and bid Pantoprazole.  Stool in commode had medium brown. States he had last black stool yesterday, was found to be heme positive on stool test here. Hgb now stable at 11.8 Impression and plan:  Continue abx, ppi, with recent history is at risk for stress related PUD will plan for EGD tomorrow.  Electronic Signatures: Stephens November H (NP)  (Signed 07-Apr-15 18:31)  Authored: Brief Consult Note   Last Updated: 07-Apr-15 18:31 by Theodore Demark (NP)

## 2014-05-29 NOTE — Consult Note (Signed)
Brief Consult Note: Diagnosis: Recurrent c diff colitis.   Patient was seen by consultant.   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  He was discharged April 10 on azithromycin for camplobacter & flagyl for 14 days for c diff.  He did complete his medications & diarrhea resolved until 2 days ago when he began having upwards of 20+ loose stools that day.  C diff PCR is positive.  He will need vancomycin for severe c diff given his admission WBC was greater than 15K.    Plan:  1) Vancomycin 125mg  PO QID for 14 days 2) Discontinue flagyl 3) Discontine florastor 4) Minimize antibiotics if at all possible in future & should contact Dr Gustavo Lah if he is placed on antibiotic therapy  5) discussed use of handwashing & bleach for surfaces 6) if no improvement consider fecal transplant 7) complete colonoscopy once c diff resolves with Dr Gustavo Lah Thanks for allowing [place] to participate in his care.  Please see full dictated note. #[id number].  Electronic Signatures: Andria Meuse (NP)  (Signed 28-Apr-15 10:29)  Authored: Brief Consult Note   Last Updated: 28-Apr-15 10:29 by Andria Meuse (NP)

## 2014-05-29 NOTE — H&P (Signed)
PATIENT NAME:  [name], [name] MR#:  [id number] DATE OF BIRTH:  January 14, 1971  DATE OF ADMISSION:  02/27/2013  PRIMARY CARE PROVIDER: [facility]   EMERGENCY DEPARTMENT REFERRING PHYSICIAN: Dr. Benjaman Lobe   CHIEF COMPLAINT: Acute hypercarbic respiratory failure, chest pain.   HISTORY OF PRESENT ILLNESS: The patient is a 44 year old African American male who was actually hospitalized from the date of 02/23/2013 to 02/26/2013. The patient initially presented with complaints of chest pain. He underwent a cardiac evaluation with a cardiac catheterization which showed a single vessel disease with occluded PDA with good collateralization from AV groove and the left circumflex. The patient was recommended to be treated with aspirin and Plavix. The patient also started complaining of feeling like his tongue was heavy and underwent evaluation for CVA and was found to have a small stroke as well,  involving the right corona radiata and posterior putamen. He was seen by Neurology and had evaluation including echocardiogram and carotid Dopplers. By the time of discharge, his speech had improved. He was seen by physical therapy and they recommended some outpatient therapy, which was also arranged. The patient also has a history of sarcoidosis, COPD and sleep apnea, and has been compliant with his CPAP machine. He was on oxygen about a year ago, but he took himself off because he could not afford it. When he got discharged from the hospital, he was arranged to have 3 liters of oxygen. The patient also, during the hospitalization was having cough and shortness of breath and he was thought to have a COPD flare as well as sarcoidosis flare, which was treated with antibiotics, nebulizers, and steroids. The patient also was discharged on a steroid taper and antibiotics. He was discharged yesterday on these. He submitted these prescriptions to the pharmacy but was not able to get them until this morning. Last night according  to his girlfriend, when the patient went home, he was doing well. He was awake and then last night he continued to get up to go to the bathroom and then started becoming sleepy and lethargic. Finally, he also started complaining of chest pain so therefore, he was brought to the ED.  The patient in the ED, was noted to be lethargic and was noted to have ABG revealing severe hypercarbia. Therefore, he was intubated. Currently, he is intubated and sedated. His girlfriend was at the bedside. He is not able to provide me with any further history.   PAST MEDICAL HISTORY:  Significant for:  1.  Coronary artery disease, recently diagnosed. 2.  Recent cerebrovascular accident.  3.  Sarcoidosis.  4.  Chronic obstructive pulmonary disease.  5.  Sleep apnea, using CPAP at home.  6.  Hypertension.  7.  Diabetes.  8.  Likely chronic renal failure.  9.  Status post appendectomy.  10.  Status post right knee surgery.  11.  Status post biopsy of the lung.   ALLERGIES: PENICILLIN.   MEDICATIONS: That he was discharged home on yesterday was a prednisone taper starting at 60 mg, taper x 10 mg, Combivent 2 puffs 4 times a day, atorvastatin 40 at bedtime, aspirin 81 mg 1 tab p.o. daily, Plavix 75 p.o. daily, Lopressor 100 mg 1 tab p.o. b.i.d., amlodipine 10 daily, lisinopril 40 daily, glipizide 5 mg 1 tab p.o. b.i.d., Levaquin 500 mg 1 tab p.o. q. 24  x 4 days, Advair 250/50 one puff b.i.d.   SOCIAL HISTORY: He continues to smoke a few cigarettes a day, but has not smoked since yesterday.  No alcohol or drug use.   FAMILY HISTORY: Significant for diabetes and hypertension.   REVIEW OF SYSTEMS: Unobtainable due to the patient being on the vent and sedated, on propofol drip.   PHYSICAL EXAMINATION: VITAL SIGNS: Temperature 98.4, pulse 118, respirations 20, blood pressure 144/102, O2  99% on the vent.  GENERAL:  Currently, the patient is a little restless. Trying to bite the ET tube. Appears critically ill.   HEENT: Head atraumatic, normocephalic. Pupils equally round, reactive to light and accommodation. There is no conjunctival pallor. No scleral icterus. Nasal exam shows no drainage or ulceration. External ear exam shows no drainage or erythema. Nasal exam shows no drainage.  OROPHARYNX: ET tube in place.  NECK: Supple without any JVD.  CARDIOVASCULAR: Regular rate and rhythm, tachycardic. No murmurs, rubs, clicks or gallops. PMI is not displaced.  LUNGS: Diminished breath sounds bilaterally without any rales, rhonchi, wheezing.  ABDOMEN: Soft, nontender, nondistended. Positive bowel sounds x4.  EXTREMITIES: No clubbing, cyanosis, or edema.  SKIN: No rash.  LYMPHATICS: No lymph nodes palpable.  VASCULAR: Good DP, PT pulses.  PSYCHIATRIC: Currently intubated.  NEUROLOGICAL: Currently intubated. Was spontaneously moving all extremities previously. LYMPH NODES: Nonpalpable.   EVALUATIONS IN THE ED:  Glucose 179, BUN 27, creatinine 1.50, sodium 137, potassium 4.5, chloride 98, CO2 is 41, calcium is 8.7. LFTs: Total protein 8.5, albumin 3.9, bilirubin total 0.5, alkaline phosphatase is 83, AST 32, ALT 41. CPK 341, CK-MB 3.6. Troponin 0.06, INR 1.0. Chest x-ray, single view showed areas of scarring bilaterally, more right than left. No edema or  consolidation.   ASSESSMENT AND PLAN: The patient is a 44 year old recently hospitalized with chest pain, noted to have coronary artery disease, recent small CVA. Has chronic diagnosis of chronic obstructive pulmonary disease, sarcoidosis, sleep apnea, who presents back with lethargy, shortness of breath, chest pain.  1.  Acute hypercarbic respiratory failure: Likely due to acute chronic obstructive pulmonary disease flare as well as underlying sleep apnea. Possible sarcoidosis flare. At this time, we will  place him on IV Solu-Medrol. Place him on inhalers per the vent, IV Levaquin empirically. We will continue the ventilator. I will ask Dr. Mortimer Fries to see the  patient. Wean as tolerated.  2.  Chest pain. Cycle cardiac enzymes. Catheterization recently with single vessel disease with good collaterals. This is likely unchanged. The patient's catheterization was only a few days ago. If further symptoms, we will consult Cardiology.  3.  Hypertension which is accelerated. We will continue metoprolol. Will do p.r.n. hydralazine.  4.  Diabetes. I will place him on sliding scale insulin. We will start Lantus.  5.  Recent cerebrovascular accident. Continue aspirin and Plavix. 6.  Chronic renal failure. We will monitor his renal function. If worsens we will have Nephrology come evaluate the patient.  7.  Miscellaneous: We will place him on Lovenox for deep vein thrombosis prophylaxis. We will placed him on H2 blocker for gastrointestinal prophylaxis.    TIME SPENT:  50 minutes of critical care time spent on this patient.     ____________________________ Lafonda Mosses. Posey Pronto, MD shp:dp D: 02/27/2013 09:43:59 ET T: 02/27/2013 10:04:42 ET JOB#: [id number]  cc: Eloise Picone H. Posey Pronto, MD, <Dictator> Alric Seton MD ELECTRONICALLY SIGNED 02/27/2013 17:11

## 2014-05-29 NOTE — Consult Note (Signed)
PATIENT NAME:  [name], [name] MR#:  [id number] DATE OF BIRTH:  09-04-1970  DATE OF CONSULTATION:  05/12/2013  REFERRING PHYSICIAN:  Nicholes Mango, MD CONSULTING PHYSICIAN:  Theodore Demark, NP  REASON FOR CONSULTATION: Evaluation of GI bleeding.   HISTORY OF PRESENT ILLNESS: I appreciate consult for 44 year old [place] American man with history of myocardial infarction/ CVA January 15 requiring lengthy skilled rehab, diabetes, sleep apnea, CRI, a recent tracheostomy reversal admitted with diarrhea/lower abdominal pain in both quadrants for evaluation of GI bleeding. The patient reports onset of bilateral lower abdominal pain with diarrhea on Saturday. Describes stools as black and loose, too many to count until today of which he has had 4 or 5. No fever or any other GI complaints. Unable to describe a triggering event. States he has had no sick contacts. He does not have any pets. He does not drink well water. He drinks from bottled water. He has not had any relatively recent antibiotics. No history of peptic ulcer disease. Denies NSAIDs other than low-dose aspirin. He is not on any other anticoagulation. Currently, he is on IV antibiotics and b.i.d. pantoprazole. There is a bit of stool in the commode that is medium brown. States he had a last black stool yesterday. He was found to be heme-positive on stool test here. His hemoglobin now is stable at 11.8. No history of prior luminal evaluation. There is a finding in the ascending colon on CT of the abdomen and pelvis of mild colitis versus mucosal edema.   PAST MEDICAL HISTORY: CVA, MI as above, coronary artery disease, chronic obstructive pulmonary disease, sleep apnea, hypertension, diabetes, chronic renal insufficiency, tracheostomy with reversal, appendectomy, right knee arthroscopy, and lung biopsy.   ALLERGIES:  PENICILLIN.   PSYCHOSOCIAL HISTORY: Lives at home with girlfriend. Former smoker. No alcohol or illicits.   FAMILY HISTORY:  Negative for liver disease, peptic ulcer disease, colorectal cancer, colon polyps. Dad did have some type of bone marrow cancer.   HOME MEDICATIONS: ASA 81 mg p.o. daily, Advair 1 puff b.i.d.,  Nitrostat 0.4 mg sl q59m prn , Magic mouthwash p.o. q.i.d., Proventil 2 puffs q.4 h p.r.n., Lopressor 100 mg p.o. b.i.d., Catapres 0.2 mg transdermal once a week, atorvastatin 40 mg p.o. daily.   REVIEW OF SYSTEMS: Ten systems reviewed.  EXTREMITIES: Significant for mild left hand weakness.  NEUROLOGICAL:  States he has recovered from his stroke well and continues to recover.  Otherwise unremarkable other than what is noted above.   LABORATORY DATA: Most recent labs, glucose 114, BUN 13, creatinine 1.36, sodium 142, potassium 3.7, chloride 108, GFR greater than 60, calcium 8.2, lipase 274, total protein 7.3, albumin 3.2, total bilirubin 0.4, ALP 80, AST 20, ALT 24. WBC 8, hemoglobin x 3 has been 12.1, 11.8, and 11.8, hematocrit 36.7, 35.6, 35.7, platelets 224, red cells normocytic with increased RDW. PT 13.9, INR 1.1. Stool was found to be heme-positive. CT with findings as noted above.   PHYSICAL EXAMINATION:  VITAL SIGNS: Most recent vital signs, temperature 98.4, pulse 69, respiratory rate 18, blood pressure 133/85, SAO2 of 91% on room air.  GENERAL: Well-appearing African American young man lying in bed in no acute distress.  HEENT: Normocephalic, atraumatic. Conjunctivae pink. Sclerae clear. NECK: Supple. No JVD, lymphadenopathy, thyromegaly.  CHEST:  Lungs clear to auscultation bilaterally. Respirations eupneic.  CARDIAC: S1, S2, RRR. No MRG. No appreciable edema.  GASTROINTESTINAL: Somewhat rounded. Bowel sounds x 4. ABDOMEN:  Very soft, some tenderness in the epigastric region and the  left side of the abdomen. No guarding, rigidity, peritoneal signs, or other abnormalities.  NEUROLOGIC: Alert, oriented x 3. Cranial nerves II-XII intact. Minimal weakness to the left hand grip. Speech clear. No facial  droop.  SKIN: Warm, dry, pink. No erythema, lesion, or rash. EXTREMITIES: MAEW x 4. Strength 5/5 handgrip as noted above.  PSYCHIATRIC: Pleasant, calm, cooperative.   IMPRESSION AND PLAN: Abdominal pain, diarrhea, likely some type of infectious colitis. Do recommend continuing antibiotics; however, with recent significant illnesses requiring lengthy hospitalizations, he is at risk for stress-related peptic ulcer disease, so do recommend we continue with a PPI and we will plan for EGD tomorrow to evaluate for his melanotic stools. Of note, the patient has request of having a heavy metal test drawn with the results given only to  himself, so I will order this.  I have communicated this with his primary team leader today, Dr. Leslye Peer, but he will need to follow up with internal medicine or his primary care provider about this.   Thank you very much for this consult. These services were provided by Stephens November, MSN, [facility], in collaboration with Loistine Simas, MD with whom I have discussed this patient in full.    ____________________________ Theodore Demark, NP chl:dd D: 05/12/2013 18:39:33 ET T: 05/12/2013 19:09:55 ET JOB#: [id number]  cc: Theodore Demark, NP, <Dictator> [place] SIGNED 06/23/2013 15:59

## 2014-05-29 NOTE — Discharge Summary (Signed)
PATIENT NAME:  [name], [name] MR#:  [id number] DATE OF BIRTH:  05-06-1970  DATE OF ADMISSION:  02/27/2013 DATE OF DISCHARGE:  03/09/2013  This summary should cover from the 31st of January until 2nd of February.  CONSULTANTS: Dr. Richardson Landry and Dr. Kathyrn Sheriff from ENT, Dr. Mortimer Fries from pulmonary, Dr. Holley Raring from nephrology as well as Dr. Candiss Norse.   For previous H and P and hospital course, please see the dictation on January 23rd in regards to the H and P by Dr. Posey Pronto and interim discharge summary on January 30th by Dr. Vianne Bulls.  FINAL DIAGNOSES: 1.  Acute respiratory failure secondary to chronic obstructive pulmonary disease/sarcoid flare and pneumonia.  2.  Acute renal failure due to acute tubular necrosis and contrast nephropathy, improved.  3.  Obstructive sleep apnea.  4.  Diabetes.  5.  Hypotension due to sedation.  6.  History of hypertension.  7.  Obesity.  8.  History of stroke.  9.  Recent myocardial infarction.   CURRENT MEDICATIONS:  1.  Atorvastatin 40 mg at bedtime. 2.  Colace 100 mg q. 12 hours. 3.  Metoprolol 50 mg q. 12 hours. 4.  Zantac 150 mg q. 12 hours. 5.  Senna 10 mL oral q. 8 hours. 6.  Lovenox 40 mg subcutaneous q. 12 hours. 7.  Combivent 4 puffs q. 4 hours. 8.  Chlorhexidine 5 mg q. 12 hours. 9.  Versed drip as well as fentanyl drip.  10.  Tylenol p.r.n. 11.  Hydralazine IV p.r.n. q. 6 hours for systolic greater than 505. 12.  Lorazepam p.r.n.   SIGNIFICANT LABS: In the last several days: On January 31st BUN was 29, creatinine was 1.42. Last creatinine is 1.4 today. White count was 14.3 on January 31st, today is 13.5. Vanc trough was 24 on February 1st. C-diff negative on January 31st.  HOSPITAL COURSE: Since the 31st of January, the patient has been doing relatively well. He has been off of pressors for a couple of days. He was maintained on the vent with full sedation. In regards to acute respiratory failure, he underwent a trach today, and I discussed with Dr.  Kathyrn Sheriff who is okay with the patient getting transferred to [facility]. The patient has a bed at [place]. In regards to the hypotension, that has resolved and it was due to sedation. He is off of Levophed and we have resumed some of the blood pressure medications. In regards to the sarcoid/ COPD with possible exacerbation, he has been off of steroids. Now he is no longer wheezy. He underwent a sputum culture on the 29th, which initially grew some gram-positives and later yeast. The gram positives have not speciated yet and he has been off of vancomycin at this point. He is off of the Levaquin at this point as well. He was on aspirin and Plavix prior, which can be resumed after the trach, in regards to his stroke and recent MI. We have resumed him on his beta blocker and statin. His acute renal failure, which was secondary to ATN from hypertension and contrast-induced nephropathy is stable. He does have CKD stage III at baseline, however. At this point, he will be transferred to [facility] for further care and weaning off the vent.   CODE STATUS: The patient is FULL code.   TOTAL TIME SPENT: 35 minutes.  ____________________________ Vivien Presto, MD sa:sb D: 03/09/2013 11:08:20 ET T: 03/09/2013 11:28:05 ET JOB#: [id number]  cc: Vivien Presto, MD, <Dictator> Vivien Presto MD ELECTRONICALLY SIGNED 03/21/2013 10:54

## 2014-05-29 NOTE — Discharge Summary (Signed)
PATIENT NAME:  [name], [name] MR#:  [id number] DATE OF BIRTH:  02-26-1970  DATE OF ADMISSION:  05/12/2013 DATE OF DISCHARGE:  05/15/2013  DISCHARGE DIAGNOSES: 1.  Clostridium difficile and Campylobacter colitis present on admission with abdominal pain and diarrhea, improving on antibiotics.  2.  Positive fecal occult blood test, worrisome for gastrointestinal bleed, although hemodynamically stable.   SECONDARY DIAGNOSES: 1.  Coronary artery disease.  2.  Cerebrovascular accident.  3.  Sarcoidosis.    4.  Chronic obstructive pulmonary disease.  5.  Hypertension.  6.  Diabetes mellitus.   CONSULTATIONS:  GI, Dr. Gustavo Lah.   PROCEDURES AND RADIOLOGY:  CT scan of the abdomen and pelvis with contrast on 6th of April showed mild bilateral renal atrophy with scattered small bilateral renal cysts.  Bilateral atelectasis or scarring with focal bronchiectasis at the right lung base.  Mild infectious inflammatory process in the colon.   MAJOR LABORATORY PANEL:  1.  UA on admission was negative.  2.  Stool culture grew positive Campylobacter jejuni [place].  Stool for C. difficile was positive.  3.  Serum heavy metal was negative.   4.  Fecal occult blood test was positive.   HISTORY AND SHORT HOSPITAL COURSE:  The patient is a 44 year old male with above-mentioned medical problems who was admitted for lower abdominal pain along with positive fecal occult blood test.  Please see Dr. Donivan Scull dictated history and physical for further details.  GI consultation was obtained with Dr. Gustavo Lah who recommended stool culture as the patient was having diarrhea and this was positive for Campylobacter and C. difficile for which the patient was started on antibiotic per GI recommendation.  The patient remained hemodynamically stable with no obvious bleed and GI deferred any kind of endoscopic evaluation at this time.  The patient's symptoms were improving and was feeling much better, by the 10th of April and he was  discharged home in stable condition.   PHYSICAL EXAMINATION: VITAL SIGNS:  On the date of discharge his vital signs were as follows:  Temperature 98.4, heart rate 59 per minute, respirations 18 per minute, blood pressure 125/81 mmHg.  He was saturating 99% on room air.  Pertinent physical examination on the date of discharge:  CARDIOVASCULAR:  S1, S2 normal.  No murmurs, rubs, or gallop.  LUNGS:  Clear to auscultation bilaterally.  No wheezing, rales, rhonchi, or crepitation.  ABDOMEN:  Soft, benign.  NEUROLOGIC:  Nonfocal examination.  All other physical examination remained at baseline.   DISCHARGE MEDICATIONS: 1.  Aspirin 81 mg by mouth daily.  2.  Proventil 2 puffs inhaled 4 times a day as needed.  3.  Catapres TTS 0.2 mg transdermal patch once a week.  4.  Advair 100/50, 1 puff inhaled twice a day.  5.  Magic mouthwash 10 mL by mouth 4 times a day, swish and spit.  6.  Nitrostat 0.4 mg sublingual every five minutes as needed.  7.  Tegaderm as needed locally. 8.  Protonix 40 mg by mouth twice daily.  9.  Atorvastatin 40 mg by mouth at bedtime.  10.  Lopressor 100 mg by mouth twice daily.  11.  Lactobacillus 1 capsule by mouth 3 times a day.  12.  Azithromycin 500 mg by mouth daily for two more days.  13.  Amlodipine 10 mg by mouth daily.  14.  Metronidazole 400 mg by mouth every eight hours for 12 days.   DISCHARGE DIET:  Regular.   DISCHARGE ACTIVITY:  As tolerated.  DISCHARGE INSTRUCTIONS AND FOLLOW-UP:  The patient was instructed to follow up with his primary care physician at [facility] in 1 to 2 weeks.  He will need follow-up with Dr. Loistine Simas from GI in 2 to 4 weeks.  His home health services were resumed on discharge.   Total time discharging this patient was 55 minutes.    ____________________________ Lucina Mellow. Manuella Ghazi, MD vss:ea D: 05/15/2013 23:16:24 ET T: 05/16/2013 06:06:56 ET JOB#: [id number]  cc: Seth Friedlander S. Manuella Ghazi, MD, <Dictator> Lollie Sails,  MD Lucina Mellow [facility] MD ELECTRONICALLY SIGNED 05/20/2013 12:43

## 2014-05-29 NOTE — Consult Note (Signed)
PATIENT NAME:  [name], [name] MR#:  [id number] DATE OF BIRTH:  May 01, 1970  DATE OF CONSULTATION:  03/06/2013  REFERRING PHYSICIAN:  Dr. Mortimer Fries CONSULTING PHYSICIAN:  Corey Hines. Richardson Landry, MD  REASON FOR CONSULTATION: Respiratory failure.   HISTORY OF PRESENT ILLNESS: A 44 year old male, who presented initially to the hospital with complaints of chest pain. Evaluation had shown single-vessel disease, but he also later underwent evaluation and was found to have had a small stroke. He has a history of sarcoidosis, COPD and sleep apnea and had been oxygen dependent at one point in the past, but quit due to cough. After this initial evaluation for the chest pain, he was discharged on antibiotics and prednisone for his COPD flare, but was readmitted to the hospital with lethargy, chest pain and fatigue. On arrival to the Emergency Room, he was lethargic with severe hypercarbia and was ultimately intubated. This was on the 23rd. He has been intubated up until this morning when he extubated himself. They were not able to leave him extubated unfortunately, so he did require reintubation.   PAST MEDICAL HISTORY: Hypertension, diabetes, chronic renal failure, sleep apnea, COPD, sarcoidosis, recent CVA and coronary artery disease.   PAST SURGICAL HISTORY: Prior appendectomy, right knee surgery and prior right lung biopsy.   ALLERGIES: PENICILLIN.   FAMILY HISTORY: Significant for diabetes and hypertension.   MEDICATIONS: Fentanyl drip, norepinephrine drip, midazolam drip, Tylenol 650 mg per rectum q.6 hours p.r.n. pain or temperature, Combivent inhaler 4 puffs q.4 hours, atorvastatin 40 mg at bedtime, Lovenox 40 mg q.12 hours, hydralazine 10 mg IV push every 6 hours p.r.n. hypertension, Levemir 15 units subcutaneous at bedtime, sliding scale insulin, Levaquin 750 mg IV q.24 hours, Zofran 4 mg IV push every 4 hours p.r.n. nausea and vomiting, Zantac 150 mg p.o. q.12 hours, Colace 100 mg p.o. q.12 hours, senna 10 mg  p.o. q.8 hours, Lopressor 50 mg p.o. every 12 hours, vecuronium 10 mg IV push q.2 hours p.r.n. agitation, lactulose 30 mL orally b.i.d., Ativan 2 mg IV push q.2 hours p.r.n. agitation, Dulcolax 10 mg rectally p.r.n., erythromycin 400 mg q.8 hours and vancomycin 1500 mg per vancomycin protocol.   REVIEW OF SYSTEMS: Unobtainable.   SOCIAL HISTORY: He has a girlfriend, who is his power of attorney.   PHYSICAL EXAMINATION:  GENERAL: Obese male, intubated and sedated, unresponsive to commands.  VITAL SIGNS: Pulse 74, respirations 20 on the vent, blood pressure 97/64. HEAD AND FACE: Head is normocephalic, atraumatic. There are no facial skin lesions.  Ears: External ears, ear canals, tympanic membranes are clear bilaterally. There is no middle ear effusion. Nose: External nose unremarkable. Nasal cavity appears clear. Oral cavity: The patient is orally intubated. Exam is limited. The lips and anterior tongue are unremarkable, but the remainder of the exam is limited as he cannot cooperate.  NECK: Supple without adenopathy or mass. There is no thyromegaly. There are no scars on the neck. Salivary glands are soft and without masses.  NEUROLOGIC: Unobtainable.   ASSESSMENT: This patient has respiratory failure. Dr. Mortimer Fries has requested he be scheduled for a tracheostomy.   PLAN: We will discuss the risks and benefits of the procedure including risk of recurrent laryngeal nerve injury, scarring, infection and possible need for prolonged tracheostomy use with his power of attorney. We will try to schedule this in the next week. Ideally, he should be switched over to heparin for rapid return of coagulation status just prior to the procedure.   ____________________________ Corey Hines. Richardson Landry, MD psb:aw  D: 03/06/2013 13:23:02 ET T: 03/06/2013 13:37:04 ET JOB#: [id number]  cc: Corey Hines. Richardson Landry, MD, <Dictator> Riley Nearing MD ELECTRONICALLY SIGNED 03/11/2013 16:55

## 2014-05-29 NOTE — Consult Note (Signed)
Referring Physician:  Albertine Patricia   Primary Care Physician:   Tracie Harrier [facility], [address] [place], [place] [address], [phone]  Reason for Consult: Admit Date: 23-Feb-2013  Chief Complaint: cva  Reason for Consult: CVA   History of Present Illness: History of Present Illness:   44 year old man with a history of sarcoidosis and hypertension presented with chest pain.  Found to have an NSTEMI.  Complaining of some new slurred speech and LUE weakness so got a Brain MRI which showed a small but acute infarct in the right corona radiata and posterior putamen, measuring approximately 1.3 cm.  Given this finding neurology is asked to evaluate acute stroke.  Upon detailed questioning, patient and his wife who I spoke to on his phone indicate that his slurred speech started on Sunday.  A day or so later he noticed mild LUE weakness.  Says he was dropping things.  Symptoms were persistent.  Nothing made symptoms better or worse.  Symptoms were mild overall but definitely noticeable per patient and wife.   MEDICAL HISTORY: Sarcoidosis.  Sleep apnea.  Hypertension.  COPD. Borderline diabetes.  SURGICAL HISTORY: Appendectomy.  Right knee arthroscopy. Biopsy of the lung.  HISTORY: for diabetes and hypertension.  HISTORY: 4 cigarettes per day. alcohol. drug use.  MEDICATIONS: Metoprolol 50 mg b.i.d.  Amlodipine 10 mg daily.  Combivent as needed.  Metformin 500 mg oral daily.   PENICILLIN.      ROS:  General weakness   HEENT no complaints   Lungs no complaints   Cardiac no complaints   GI no complaints   GU no complaints   Musculoskeletal no complaints   Extremities no complaints   Skin no complaints   Endocrine no complaints   Psych no complaints   Past Medical/Surgical Hx:  Sarcoidosis:   COPD:   bronchitis:   Sarcosis:   collapsed lung:   Appendectomy:   knee surg:   Home Medications: Medication Instructions  Last Modified Date/Time  lisinopril 20 mg oral tablet 1 tab(s) orally once a day 20-Jan-15 10:12  Lopressor 100 mg oral tablet 1 tab(s) orally 2 times a day 20-Jan-15 10:12  amLODIPine 10 mg oral tablet 1 tab(s) orally once a day 20-Jan-15 10:12  atorvastatin 40 mg oral tablet 1 tab(s) orally once a day (at bedtime) 20-Jan-15 10:12  aspirin 81 mg oral delayed release tablet 1 tab(s) orally once a day 20-Jan-15 10:12  clopidogrel 75 mg oral tablet 1 tab(s) orally once a day 20-Jan-15 10:12  Combivent Respimat CFC free 20 mcg-100 mcg/inh inhalation aerosol 2 puff(s) inhaled 2 times a day 19-Jan-15 02:27  metFORMIN 500 mg oral tablet 1 tab(s) orally 2 times a day 19-Jan-15 02:27   [facility] Current Meds:   Acetaminophen * tablet, ( Tylenol (325 mg) tablet)  650 mg Oral q4h PRN for pain or temp. greater than 100.4  - Indication: Pain/Fever  Nitroglycerin tablet, ( Nitrostat SL)  0.4 mg Sublingual Q5M PRN for chest pain  - Indication: Angina/ Hypertension  Instructions:  q 5 minutes x 3 doses PRN  Ondansetron injection, ( Zofran injection )  4 mg, IV push, q4h PRN for Nausea/Vomiting  Indication: Nausea/ Vomiting  Insulin SS -Novolog injection, Subcutaneous, FSBS tid/ac  give no insulin if FSBS 0 - 150     2 unit(s) if FSBS 151 - 200     4 unit(s) if FSBS 201 - 250     6 unit(s) if  FSBS 251 - 300     8 unit(s) if FSBS 301 - 350     10 unit(s) if FSBS 351 - 400  Call MD if Blood Glucose greater than 400, [Waste Code: Black]  meTOProlol tartrate tablet, ( Lopressor)  50 mg Oral q12h  - Indication: Antihypertensive/ Angina  amLODIPine tablet, ( Norvasc)  10 mg Oral daily  - Indication: Hypertension/ Angina  hydrALAZINE HCl injection, ( Apresoline injection )  10 mg, IV push, q6h PRN for SBP >170  Indication: Hypertension/ CHF  cloNIDine  tablet, ( Catapres)  0.1 mg Oral q6h PRN for SBP greater than 160  - Indication: Hypertension/ Withdrawal Symptoms  atorvaSTATin tablet,  (  Lipitor)  40 mg Oral at bedtime  - Indication: Hypercholesterolemia  Aspirin Enteric Coated tablet, ( Ecotrin)  81 mg Oral daily  - Indication: Pain/Fever/Thromboembolic Disorders/Post MI/Prophylaxis MI  Instructions:  Initiate Bleeding Precautions Protocol--DO NOT CRUSH  Clopidogrel tablet,  ( pLAVix)  75 mg Oral daily  Instructions:  Initiate Bleeding Precautions Protocol  DiphenhydrAMINE Oral,  ( Benadryl)  25 mg Oral q6h PRN for itching, allergic reaction  - Indication: Allergy Reaction/ Mild Sedation/ Motion Sickness Prevention/ Cough/ Nausea/ Dystonic Reaction  Instructions:  itching  methylPREDNISolone injection,  ( SoluMEDROL injection )  40 mg, IV push, q6h  Indication: Anti-inflammatory Agent/ Immunosuppressant  Ranitidine tablet, ( ZanTAC)  150 mg Oral q12h  - Indication: Hyperacidity  Levofloxacin injection,  ( Levaquin injection )  500 mg, IV Piggyback, q24h, Infuse over 60 minute(s)  Indication: Infection  Codeine-GuaiFENesin syrup, ( Robitussin AC syrup )  15 ml Oral q6h PRN for cough  guaiFENesin LA tablet,  ( Mucinex)  600 mg Oral bid  - Indication: Cough  Instructions:  DO NOT CRUSH  glipiZIDE tablet, ( Glucotrol)  5 mg Oral bid/ac  - Indication: Diabetes  Allergies:  Penicillin: N/V  Vital Signs: **Vital Signs.:   21-Jan-15 11:15  Vital Signs Type Routine  Temperature Temperature (F) 97.5  Celsius 36.3  Temperature Source oral  Pulse Pulse 89  Respirations Respirations 20  Systolic BP Systolic BP 938  Diastolic BP (mmHg) Diastolic BP (mmHg) 86  Mean BP 101  Pulse Ox % Pulse Ox % 90  Pulse Ox Activity Level  At rest  Oxygen Delivery Room Air/ 21 %   EXAM: GENERAL: Pleasant man.  In NAD.  Normocephalic and atraumatic.  EYES: Funduscopic exam shows normal disc size, appearance and C/D ratio without clear evidence of papilledema.  CARDIOVASCULAR: S1 and S2 sounds are within normal limits, without murmurs, gallops, or  rubs.  MUSCULOSKELETAL: Bulk - Obese Tone - Normal Pronator Drift - Mild LUE drift. Ambulation - Gait and station are deferred due to falls precautions.  R/L 5/4    Shoulder abduction (deltoid/supraspinatus, axillary/suprascapular n, C5) 5/4    Elbow flexion (biceps brachii, musculoskeletal n, C5-6) 5/4    Elbow extension (triceps, radial n, C7) 5/5    Finger adduction (interossei, ulnar n, T1)   5/5    Hip flexion (iliopsoas, L1/L2) 5/5    Knee flexion (hamstrings, sciatic n, L5/S1) 5/5    Knee extension (quadriceps, femoral n, L3/4) 5/5    Ankle dorsiflexion (tibialis anterior, deep fibular n, L4/5) 5/5    Ankle plantarflexion (gastroc, tibial n, S1)  NEUROLOGICAL: MENTAL STATUS: Patient is oriented to person, place and time.  Recent and remote memory are intact.  Attention span and concentration are intact.  Naming, repetition, comprehension are within normal limits.  There is  a mild expressive aphasia and mild dysarthria noted.  Patient's fund of knowledge is within normal limits for educational level.  CRANIAL NERVES: Normal    CN II (normal visual acuity and visual fields) Normal    CN III, IV, VI (extraocular muscles are intact) Normal    CN V (facial sensation is intact bilaterally) Normal    CN VII (facial strength is intact bilaterally) Normal    CN VIII (hearing is intact bilaterally) Normal    CN IX/X (palate elevates midline, normal phonation) Normal    CN XI (shoulder shrug strength is normal and symmetric) Normal    CN XII (tongue protrudes midline)   SENSATION: Intact to pain and temp bilaterally (spinothalamic tracts) Intact to position and vibration bilaterally (dorsal columns)   REFLEXES: R/L 2+/2+    Biceps 2+/2+    Brachioradialis   2+/2+    Patellar 2+/2+    Achilles   COORDINATION/CEREBELLAR: Finger to nose testing is within normal limits..  Lab Results:  LabObservation:  19-Jan-15 16:03   OBSERVATION Reason for Test  Cardiac Catherization:   19-Jan-15 13:25   Cardiac Catheterization  [facility] [place], [place] [address] [phone]   Cardiovascular Catheterization Comprehensive Report   Patient: Kentaro Alewine Study date: 02/23/2013 MR number: [id number] Account number: [id number]   DOB: 1970/05/27 Age: 32 years Gender: Male Race: Black Height: 72.8 in Weight: 313.3 lb   Interventional Cardiologist:  Miquel Dunn, PhD, MD   SUMMARY:   -CARDIAC STRUCTURES: EF calculated by contrast ventriculography was 44 %.   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   CORONARY CIRCULATION: The coronary circulation is co-dominant. LAD: There was a 0 % stenosis. Circumflex: There was a 0 % stenosis. Right PDA: The distal vessel was supplied by moderate collaterals from the left AV groove artery. There was a 100 % stenosis.   VENTRICLES: EF calculated by contrast ventriculography was 44 %. RECOMMENDATIONS: -Medical therapy.   INDICATIONS: Angina/MI: myocardial infarction without ST elevation (NSTEMI).   HISTORY: No history of previous myocardial infarction. There was no prior diagnosis of congestive heart failure. The patient has hypertension, untreated diabetes, and a history of current cigarette use. There was no history of cerebrovascular disease, peripheral arterial disease, or dyslipidemia. There was no family history of coronary artery disease. PRIOR CARDIOVASCULAR PROCEDURES: No history of valve surgery, coronary or graft percutaneous intervention, or coronary bypass surgery.   PRIOR DIAGNOSTIC TEST RESULTS: No prior stress test is available.   PROCEDURES PERFORMED: Left heart catheterization with ventriculography. Procedure: Arterial Occlusive Device   PROCEDURE: The risks and alternatives of the procedures and conscious sedation were explained to the patient and informed consent was obtained. The patient was brought to the cath lab and placed on  the table. The planned puncture sites were prepped and draped in the usual sterile fashion. Cardiac catheterization performed electively.   -Right femoral artery access. The vessel was accessed, a wire was threaded into the vessel, and a was advanced over the wire into the vessel.   -Left heart catheterization. A catheter was advanced to the ascending aorta. Ventriculography was performed using power injection of contrast agent.   -Arterial Occlusive Device.   PROCEDURE COMPLETION: An IABP was not used. No mechanical ventricular support was required. TIMING: Test started at 13:29. Test concluded at 14:15. RADIATION EXPOSURE: Fluoroscopy time: 6.1 min. Fluoroscopy dose: 2.692 Gray. MEDICATIONS GIVEN: Midazolam, 1 mg, IV, at 13:46. CONTRAST GIVEN: Isovue 220 ml.  Prepared and signed by   Miquel Dunn, PhD, MD Signed 02/23/2013 14:22:49   STUDY DIAGRAM   Angiographic findings Native coronary lesions:  LAD: Lesion 1: 0 % stenosis.  Circumflex: Lesion 1: 0 % stenosis.  RPDA: Lesion 1: 100 % stenosis.   HEMODYNAMIC TABLES   Pressures:  Baseline Pressures:  - HR: 92 Pressures:  - Rhythm: Pressures:  -- Aortic Pressure (S/D/M): 133/83/107 Pressures:  -- Left Ventricle (s/edp): 124/13/--   Outputs:  Baseline Outputs:  -- CALCULATIONS[age] Outputs:  -- CALCULATIONS: Body Surface Area:2.60 Outputs:  -- CALCULATIONS: Height in cm: 185.00 Outputs:  -- CALCULATIONS: Sex: Male Outputs:  -- CALCULATIONS: Weight in kg: 142.40  Cardiology:  19-Jan-15 00:03   Ventricular Rate 99  Atrial Rate 99  P-R Interval 138  QRS Duration 140  QT 398  QTc 510  P Axis 59  R Axis -75  T Axis 27  ECG interpretation Normal sinus rhythm Possible Left atrial enlargement Right bundle branch block Left anterior fascicular block *** Bifascicular block *** Left ventricular hypertrophy Cannot rule out Inferior infarct (masked by fascicular block?) , age undetermined Abnormal  ECG When compared with ECG of 07-Feb-2012 15:21, (RBBB and left anterior fascicular block) is now Present Minimal criteria for Inferior infarct are now Present ----------unconfirmed---------- Confirmed by OVERREAD, NOT ([id number]), editorPEARSON, BARBARA ([id number]) on 02/23/2013 8:54:25 AM    16:03   Echo Doppler REASON FOR EXAM:     COMMENTS:     PROCEDURE: [facility] - ECHO DOPPLER COMPLETE(TRANSTHOR)  - Feb 23 2013  4:03PM   RESULT: Echocardiogram Report  Patient Name:   [facility] Date of Exam: 02/23/2013 Medical Rec #:  [id number]         Custom1: Date of Birth:  November 29, 1970       Height:       73.0 in Patient Age:    42 years       Weight:       314.0 lb Patient Gender: M              BSA:          2.61 m??  Indications: MI Sonographer:    Arville Go RDCS Referring Phys: Phillips Climes, S  Summary:  1. Left ventricular ejection fraction, by visual estimation, is 40 to  45%.  2. Mildly decreased global left ventricular systolic function.  3. Basal and mid inferior septum is abnormal.  4. Moderate left ventricular hypertrophy.  5. Mild to moderately increased left ventricular internal cavity size.  6. Mild mitral valve regurgitation.  7. Severely increased left ventricular posterior wall thickness.  8. Mild tricuspid regurgitation. 2D AND M-MODE MEASUREMENTS (normal ranges within parentheses): Left Ventricle:          Normal IVSd (2D):      2.04 cm (0.7-1.1) LVPWd (2D):     1.62 cm (0.7-1.1) Aorta/LA:                  Normal LVIDd (2D):     5.53 cm (3.4-5.7) Aortic Root (2D): 3.30 cm (2.4-3.7) LVIDs (2D):     4.42 cm           Left Atrium (2D): 3.60 cm (1.9-4.0) LV FS (2D):     20.1 %   (>25%) LV EF (2D):     40.6 %   (>50%)  Right Ventricle:                                   RVd (2D): LV DIASTOLIC FUNCTION: MV Peak E: 0.78 m/s Decel Time: 248 msec MV Peak A: 0.65 m/s E/A Ratio: 1.19 SPECTRAL DOPPLER ANALYSIS (where applicable): Mitral Valve: MV  P1/2 Time: 71.92 msec MV Area, PHT: 3.06 cm?? Aortic Valve: AoV Max Vel: 1.41 m/s AoV Peak PG: 8.0 mmHg AoV Mean PG: LVOT Vmax: 0.93 m/s LVOT VTI:  LVOT Diameter: 2.50 cm AoV Area, Vmax: 3.25 cm?? AoV Area, VTI:  AoV Area, Vmn: Pulmonic Valve: PV Max Velocity: 0.91 m/s PV Max PG: 3.3 mmHg PV Mean PG:  PHYSICIAN INTERPRETATION: Left Ventricle: The left ventricular internal cavity size was mild to  moderately increased. LV posterior wall thickness was severely increased.  Moderate left ventricular hypertrophy. Global LV systolic function was  mildly decreased. Left ventricular ejection fraction, by visual  estimation, is 40 to 45%.  LV Wall Scoring: The basal and mid inferior septum is hypokinetic. Right Ventricle: The right ventricular size is normal. Left Atrium: The left atrium is normal in size. Right Atrium: The right atrium is normal in size. Pericardium: There is no evidence of pericardial effusion. Mitral Valve: Mild mitral valve regurgitation is seen. Tricuspid Valve: Mild tricuspid regurgitation is visualized. Aortic Valve: The aortic valve is normal. Aorta: The aortic root is normal in size and structure.  [place] MD Electronically signed by [id number] Isaias Cowman MD Signature Date/Time: 02/23/2013/5:30:53 PM  *** Final *** IMPRESSION: .    Verified BySheppard Coil . PARASCHOS, M.D., MD  Routine Chem:  19-Jan-15 00:32   Glucose, Serum  232  BUN 12  Creatinine (comp)  1.39  Sodium, Serum 141  Potassium, Serum 4.1  Chloride, Serum 103  CO2, Serum  37  Calcium (Total), Serum  8.4  Anion Gap  1  Osmolality (calc) 288  eGFR (African American) >60  eGFR (Non-African American) >60 (eGFR values <10m/min/1.73 m2 may be an indication of chronic kidney disease (CKD). Calculated eGFR is useful in patients with stable renal function. The eGFR calculation will not be reliable in acutely ill patients when serum creatinine is changing rapidly. It is not  useful in  patients on dialysis. The eGFR calculation may not be applicable to patients at the low and high extremes of body sizes, pregnant women, and vegetarians.)  Result Comment TROPONIN - RESULTS VERIFIED BY REPEAT TESTING.  - CALLED TO BRYAN GRAY AT 0114 ON 02/23/13  - READ-BACK PROCESS PERFORMED.  - TPL  Result(s) reported on 23 Feb 2013 at 01:16AM.  B-Type Natriuretic Peptide ([facility]  320 (Result(s) reported on 23 Feb 2013 at 01:10AM.)    04:27   Result Comment TROPONIN - RESULTS VERIFIED BY REPEAT TESTING.  - ELEVATED TROPONIN PREVIOUSLY CALLED AT  - 0114 02/23/13.PMH  Result(s) reported on 23 Feb 2013 at 05:47AM.  Magnesium, Serum  1.7 (1.8-2.4 THERAPEUTIC RANGE: 4-7 mg/dL TOXIC: > 10 mg/dL  -----------------------)    08:14   Result Comment troponin - RESULTS VERIFIED BY REPEAT TESTING.  - [place] at [place]1/[place] by  - tpl - lrt  Result(s) reported on 23 Feb 2013 at 09:16AM.  20-Jan-15 04:54   Hemoglobin A1c ([facility]  7.8 (The American Diabetes Association recommends that a primary goal of therapy should be <7% and that physicians should reevaluate the treatment regimen in patients with HbA1c values consistently >  8%.)  Cholesterol, Serum 140  Triglycerides, Serum 144  HDL (INHOUSE)  35  VLDL Cholesterol Calculated 29  LDL Cholesterol Calculated 76 (Result(s) reported on 24 Feb 2013 at 05:24AM.)  Glucose, Serum  102  BUN 13  Creatinine (comp)  1.43  Sodium, Serum  134  Potassium, Serum 4.3  Chloride, Serum 98  CO2, Serum  35  Calcium (Total), Serum  8.1  Anion Gap  1  Osmolality (calc) 269  eGFR (African American) >60  eGFR (Non-African American)  60 (eGFR values <17m/min/1.73 m2 may be an indication of chronic kidney disease (CKD). Calculated eGFR is useful in patients with stable renal function. The eGFR calculation will not be reliable in acutely ill patients when serum creatinine is changing rapidly. It is not useful in  patients on dialysis. The  eGFR calculation may not be applicable to patients at the low and high extremes of body sizes, pregnant women, and vegetarians.)  Cardiac:  19-Jan-15 00:32   CK, Total  510  CPK-MB, Serum 2.8 (Result(s) reported on 23 Feb 2013 at 01:53AM.)  Troponin I  0.15 (0.00-0.05 0.05 ng/mL or less: NEGATIVE  Repeat testing in 3-6 hrs  if clinically indicated. >0.05 ng/mL: POTENTIAL  MYOCARDIAL INJURY. Repeat  testing in 3-6 hrs if  clinically indicated. NOTE: An increase or decrease  of 30% or more on serial  testing suggests a  clinically important change)    04:27   CK, Total  501  CPK-MB, Serum 2.4 (Result(s) reported on 23 Feb 2013 at 05:44AM.)  Troponin I  0.19 (0.00-0.05 0.05 ng/mL or less: NEGATIVE  Repeat testing in 3-6 hrs  if clinically indicated. >0.05 ng/mL: POTENTIAL  MYOCARDIAL INJURY. Repeat  testing in 3-6 hrs if  clinically indicated. NOTE: An increase or decrease  of 30% or more on serial  testing suggests a  clinically important change)    08:14   CK, Total  493  CPK-MB, Serum 2.8 (Result(s) reported on 23 Feb 2013 at 0[facility])  Troponin I  0.18 (0.00-0.05 0.05 ng/mL or less: NEGATIVE  Repeat testing in 3-6 hrs  if clinically indicated. >0.05 ng/mL: POTENTIAL  MYOCARDIAL INJURY. Repeat  testing in 3-6 hrs if  clinically indicated. NOTE: An increase or decrease  of 30% or more on serial  testing suggests a  clinically important change)  Routine Hem:  19-Jan-15 00:32   WBC (CBC) 6.6  RBC (CBC) 5.51  Hemoglobin (CBC) 15.5  Hematocrit (CBC) 47.2  Platelet Count (CBC) 193 (Result(s) reported on 23 Feb 2013 at 12:53AM.)  MCV 86  MCH 28.2  MCHC 32.9  RDW 14.1  20-Jan-15 04:54   WBC (CBC) 8.4  RBC (CBC) 5.49  Hemoglobin (CBC) 15.3  Hematocrit (CBC) 47.5  Platelet Count (CBC) 188  MCV 87  MCH 27.9  MCHC 32.3  RDW 14.1  Neutrophil % 66.0  Lymphocyte % 17.9  Monocyte % 15.1  Eosinophil % 0.6  Basophil % 0.4  Neutrophil # 5.5  Lymphocyte # 1.5   Monocyte #  1.3  Eosinophil # 0.0  Basophil # 0.0 (Result(s) reported on 24 Feb 2013 at 05:24AM.)   Radiology Results: [place]    20-Jan-15 17:43, [place] Doppler Bilateral  [place] Doppler Bilateral   REASON FOR EXAM:    slurred speech  COMMENTS:       PROCEDURE: [place] - [place] DOPPLER BILATERAL  - Feb 24 2013  5:43PM     CLINICAL DATA:  Slurred speech.    EXAM:  BILATERAL CAROTID DUPLEX ULTRASOUND    TECHNIQUE:  Pearline Cables scale imaging, color Doppler and duplex ultrasound were  performed of bilateral carotid and vertebral arteries in the neck.    COMPARISON:  None.  FINDINGS:  Criteria: Quantification of carotid stenosis is based on velocity  parameters that correlate the residual internal carotid diameter  with NASCET-based stenosis levels, using the diameter of the distal  internal carotid lumen as the denominator for stenosis measurement.    The following velocity measurements were obtained:    RIGHT    ICA:  60 cm/sec    CCA:  389 cm/sec    SYSTOLIC ICA/CCA RATIO:  0.4  DIASTOLIC ICA/CCA RATIO:  1.0    ECA:  73 cm/sec    LEFT    ICA:  84 cm/sec    CCA:  373 cm/sec    SYSTOLIC ICA/CCA RATIO:  0.5    DIASTOLIC ICA/CCA RATIO:  1.5    ECA:  80 cm/sec  RIGHT CAROTID ARTERY: Right carotid arteries are patent without  stenosis or plaque.    RIGHT VERTEBRAL ARTERY: Antegrade flow and normal waveform in the  right vertebral artery.    LEFT CAROTID ARTERY: Left carotid arteries are patent without plaque  or stenosis.    LEFT VERTEBRAL ARTERY: Antegrade flow and normal waveform in the  left vertebral artery.     IMPRESSION:  Normal carotid artery duplex. No evidence for carotid artery  stenosis.  Electronically Signed    By: Markus Daft M.D.    On: 02/24/2013 22:02         Verified By: Burman Riis, M.D.,  MRI:    21-Jan-15 10:34, MRI Brain Without Contrast  MRI Brain Without Contrast   REASON FOR EXAM:    slurred speech  COMMENTS:        PROCEDURE: MR  - MR BRAIN WO CONTRAST  - Feb 25 2013 10:34AM     CLINICAL DATA:  Slurred speech and minor headaches.    EXAM:  MRI HEAD WITHOUT CONTRAST    TECHNIQUE:  Multiplanar, multiecho pulse sequences of the brain and surrounding  structures were obtained without intravenous contrast.    COMPARISON:  Head CT 02/24/2013  FINDINGS:  There is a 1.3 cm acute infarct in the right corona radiata  extending into the posterior putamen. Scattered, small foci of T2  hyperintensity within the subcortical and periventricular white  matter are nonspecific but compatible with minimal chronic small  vessel ischemic disease. Incidental note is made of a cavum septum  pellucidum et vergae. There is no evidence of mass, midline shift,  intracranial hemorrhage, or extra-axial fluid collection. Ventricles  and sulci are normal. Major intracranial vascular flow voids are  unremarkable. Mastoid air cells are clear. Mucous retention cyst is  noted in theleft maxillary sinus. Orbits are unremarkable.     IMPRESSION:  Small, acute infarct involving the right corona radiata and  posterior putamen.  Electronically Signed    By: Logan Bores    On: 02/25/2013 10:51         Verified By: Ferol Luz, M.D.,  CT:    20-Jan-15 15:23, CT Head Without Contrast  CT Head Without Contrast   REASON FOR EXAM:    slurred speech  COMMENTS:       PROCEDURE: CT  - CT HEAD WITHOUT CONTRAST  - Feb 24 2013  3:23PM     CLINICAL DATA:  Facial swelling and slurred speech    EXAM:  CT HEAD  WITHOUT CONTRAST    TECHNIQUE:  Contiguous axial images wereobtained from the base of the skull  through the vertex without intravenous contrast.    COMPARISON:  Prior CT from 03/13/2006  FINDINGS:  A focal hypodensity is now seen involving the periventricular white  matter of the right corona radiata, extending inferiorly towards the  right putamen/right basal ganglia. Finding is age indeterminate. No  acute  intracranial hemorrhage.    No mass lesion or midline shift. Gray-white matter differentiation  is well maintained. Ventricles are normal in size without evidence  of hydrocephalus. CSF containing spaces are within normal limits. No  extra-axial fluid collection.    The calvarium is intact.    Orbital soft tissues are within normal limits.  The paranasal sinuses and mastoid air cells are well pneumatized and  free of fluid.    Scalp soft tissues are unremarkable.     IMPRESSION:  New focal hypodensity within the right putamen extending superiorly  into the right periventricular white matter of the right corona  radiata. While this finding is age indeterminate, possible acute  ischemia could have this appearance. Correlation with MRI could be  performed for further evaluation as clinically indicated. No acute  intracranial hemorrhage.      Electronically Signed    By: Jeannine Boga M.D.    On: 02/24/2013 15:44         Verified By: Neomia Glass, M.D.,   Impression/Recommendations: Recommendations:   44 year old man with a history of sarcoidosis and hypertension presented with chest pain, found to have NSTEMI, then complainedof new slurred speech and LUE weakness so got a Brain MRI which showed a small but acute infarct in the right corona radiata and posterior putamen, measuring approximately 1.3 cm.   with ischemic stroke.  Uncertain etiology but potentially related to recent NSTEMI.  Patient with numerous risk factors for stroke including OSA, hypertension, obesity and borderline diabetes.  Patient needs antiplatelet agent to be increased to plavix since had stroke on ASA 81 mg daily.  Also needs statin.  Needs full ischemic stroke workup. STROKEContinue frequent neuro checks (Q4h) for first 24 hours and reassess frequency after that time.Brain MRI/A confirms suspected CVA as aboveCarotid doppler - negative for carotid stenosisTTE with bubble study to evaluate for  intracardiac shunts or cardiac thrombiMonitor on telemetry to eval for afibCheck fasting lipid panel, TSH and HgbA1cCycle cardiac enzymes Cardiac monitoring as described above.Antiplatelet therapy: Plavix 75 mg daily TREATMENT:Per ATP III guidlines, given that patient has additional stroke risk factors, start or increase statin if LDL > 70 PRESSURE CONTROL:Allow permissive HTN for BP less than 220/110Restart home antihypertensive medications prior to discharge. PT, OT, Speech evaluationsConsult speech therapy for swallowing assessment and stroke education.Strict NPO until cleared by speech therapy.Fall PrecautionsCounseled against smoking. DVT prophylaxis with [place] lovenox.  have reviewed the results of the most recent imaging studies, tests and labs as outlined above and answered all related questions. have personally viewed the patient's Brain MRI and it shows acute infarction as outlined above. and coordinated plan of care with hospitalist.   Electronic Signatures: Anabel Bene (MD)  (Signed 22-Jan-15 01:28)  Authored: REFERRING PHYSICIAN, Primary Care Physician, Consult, History of Present Illness, Review of Systems, PAST MEDICAL/SURGICAL HISTORY, HOME MEDICATIONS, Current Medications, ALLERGIES, NURSING VITAL SIGNS, Physical Exam-, LAB RESULTS, RADIOLOGY RESULTS, Recommendations   Last Updated: 22-Jan-15 01:28 by Anabel Bene (MD)

## 2014-05-29 NOTE — Consult Note (Signed)
Chief Complaint:  Subjective/Chief Complaint feeling better, minimal suprapubic discomfort.  no n/v.  bm forming.  no black stool.   VITAL SIGNS/ANCILLARY NOTES: **Vital Signs.:   09-Apr-15 06:02  Vital Signs Type Routine  Temperature Temperature (F) 98.1  Celsius 36.7  Pulse Pulse 61  Respirations Respirations 18  Systolic BP Systolic BP 694  Diastolic BP (mmHg) Diastolic BP (mmHg) 75  Mean BP 90  Pulse Ox % Pulse Ox % 97  Pulse Ox Activity Level  At rest  Oxygen Delivery Room Air/ 21 %   Brief Assessment:  Cardiac Regular   Respiratory clear BS   Gastrointestinal details normal Soft  Nontender  Nondistended   Lab Results: Routine Micro:  07-Apr-15 16:13   Micro Text Report CLOSTRIDIUM DIFFICILE   C.DIFFICILE ANTIGEN       C.DIFFICILE GDH ANTIGEN : POSITIVE   C.DIFFICILE TOXIN A/B     C.DIFFICILE TOXINS A AND/OR B: POSITIVE   INTERPRETATION            Positive for toxigenic C. difficile, active toxin   ANTIBIOTIC                      17:41   Micro Text Report STOOL COMPREHENSIVE   COMMENT                   NO SALMONELLA OR SHIGELLA ISOLATED   COMMENT                   NO PATHOGENIC E.COLI DETECTED   COMMENT                   POSITIVE CAMPYLOBACTER AG (JEJUNI/COLI)   ANTIBIOTIC                        Routine Chem:  07-Apr-15 16:13   Result Comment POSITIVE C. DIFF - NOTIFIED OF CRITICAL VALUE  - C/[facility] AT 1948 05/12/13.PMH  - READ-BACK PROCESS PERFORMED.  Result(s) reported on 12 May 2013 at 07:50PM.    17:41   Result Comment POSITIVE CAMPY - CTJ TO GLENDA FIELDS @ 1441 ON 05-13-13  - READ-BACK PROCESS PERFORMED.  - FAXED TO HEALTH DEPARTMENT.  - PRINTED TO INFECTION CONTROL.  Result(s) reported on 14 May 2013 at 10:07AM.    21:57   Result Comment WBCS STOOL - WRONG SPECIMEN SENT. Sharol Harness [facility] AT 2224 05/12/13  09-Apr-15 05:09   Magnesium, Serum 1.8 (1.8-2.4 THERAPEUTIC RANGE: 4-7 mg/dL TOXIC: > 10 mg/dL  -----------------------)   Glucose, Serum  104  BUN 10  Creatinine (comp) 1.10  Sodium, Serum 139  Potassium, Serum 3.6  Chloride, Serum 106  CO2, Serum 29  Calcium (Total), Serum  8.1  Anion Gap  4  Osmolality (calc) 277  eGFR (African American) >60  eGFR (Non-African American) >60 (eGFR values <64m/min/1.73 m2 may be an indication of chronic kidney disease (CKD). Calculated eGFR is useful in patients with stable renal function. The eGFR calculation will not be reliable in acutely ill patients when serum creatinine is changing rapidly. It is not useful in  patients on dialysis. The eGFR calculation may not be applicable to patients at the low and high extremes of body sizes, pregnant women, and vegetarians.)  Routine Hem:  08-Apr-15 04:34   WBC (CBC) 7.1   Assessment/Plan:  Assessment/Plan:  Assessment 1)  acute diarrheal illness-colitis, mild, by ct.  symptoms improving on abx.  stool positive for  campylobacter and c. diff.  2) multiple medical problems with recent MI, cva, sarcoidiosis, CRI,   Plan 1) finish course of azithromycin, do 14 day course of po flagyl.  start FLORASTOR  500 mg bid in 3 days, continue for one month after finishing the abx.   will need o/p GI fu.  signing off, reconsult as needed.   Electronic Signatures: Loistine Simas (MD)  (Signed 09-Apr-15 12:01)  Authored: Chief Complaint, VITAL SIGNS/ANCILLARY NOTES, Brief Assessment, Lab Results, Assessment/Plan   Last Updated: 09-Apr-15 12:01 by Loistine Simas (MD)

## 2014-08-11 ENCOUNTER — Encounter: Payer: Self-pay | Admitting: Emergency Medicine

## 2014-08-11 ENCOUNTER — Emergency Department
Admission: EM | Admit: 2014-08-11 | Discharge: 2014-08-11 | Disposition: A | Discharge status: Home / Self Care | Payer: Medicare Other | Source: Home / Self Care | Attending: Emergency Medicine | Admitting: Emergency Medicine

## 2014-08-11 DIAGNOSIS — Z720 Tobacco use: Secondary | ICD-10-CM

## 2014-08-11 DIAGNOSIS — K047 Periapical abscess without sinus: Secondary | ICD-10-CM

## 2014-08-11 DIAGNOSIS — I10 Essential (primary) hypertension: Secondary | ICD-10-CM

## 2014-08-11 DIAGNOSIS — E669 Obesity, unspecified: Secondary | ICD-10-CM | POA: Diagnosis present

## 2014-08-11 DIAGNOSIS — J441 Chronic obstructive pulmonary disease with (acute) exacerbation: Secondary | ICD-10-CM | POA: Diagnosis present

## 2014-08-11 DIAGNOSIS — Z7951 Long term (current) use of inhaled steroids: Secondary | ICD-10-CM

## 2014-08-11 DIAGNOSIS — Z880 Allergy status to penicillin: Secondary | ICD-10-CM

## 2014-08-11 DIAGNOSIS — Z9119 Patient's noncompliance with other medical treatment and regimen: Secondary | ICD-10-CM | POA: Diagnosis present

## 2014-08-11 DIAGNOSIS — Z794 Long term (current) use of insulin: Secondary | ICD-10-CM

## 2014-08-11 DIAGNOSIS — J9601 Acute respiratory failure with hypoxia: Principal | ICD-10-CM | POA: Diagnosis present

## 2014-08-11 DIAGNOSIS — N189 Chronic kidney disease, unspecified: Secondary | ICD-10-CM | POA: Diagnosis present

## 2014-08-11 DIAGNOSIS — E1165 Type 2 diabetes mellitus with hyperglycemia: Secondary | ICD-10-CM | POA: Diagnosis present

## 2014-08-11 DIAGNOSIS — J479 Bronchiectasis, uncomplicated: Secondary | ICD-10-CM | POA: Diagnosis present

## 2014-08-11 DIAGNOSIS — Z79899 Other long term (current) drug therapy: Secondary | ICD-10-CM

## 2014-08-11 DIAGNOSIS — R609 Edema, unspecified: Secondary | ICD-10-CM | POA: Diagnosis not present

## 2014-08-11 DIAGNOSIS — E119 Type 2 diabetes mellitus without complications: Secondary | ICD-10-CM | POA: Insufficient documentation

## 2014-08-11 DIAGNOSIS — M7989 Other specified soft tissue disorders: Secondary | ICD-10-CM | POA: Diagnosis present

## 2014-08-11 DIAGNOSIS — J9602 Acute respiratory failure with hypercapnia: Secondary | ICD-10-CM | POA: Diagnosis present

## 2014-08-11 DIAGNOSIS — E785 Hyperlipidemia, unspecified: Secondary | ICD-10-CM

## 2014-08-11 DIAGNOSIS — Z9049 Acquired absence of other specified parts of digestive tract: Secondary | ICD-10-CM | POA: Diagnosis present

## 2014-08-11 DIAGNOSIS — E875 Hyperkalemia: Secondary | ICD-10-CM | POA: Diagnosis present

## 2014-08-11 DIAGNOSIS — G4733 Obstructive sleep apnea (adult) (pediatric): Secondary | ICD-10-CM | POA: Diagnosis present

## 2014-08-11 DIAGNOSIS — E780 Pure hypercholesterolemia: Secondary | ICD-10-CM | POA: Diagnosis present

## 2014-08-11 DIAGNOSIS — Z716 Tobacco abuse counseling: Secondary | ICD-10-CM | POA: Diagnosis present

## 2014-08-11 DIAGNOSIS — D869 Sarcoidosis, unspecified: Secondary | ICD-10-CM | POA: Diagnosis present

## 2014-08-11 DIAGNOSIS — Z8249 Family history of ischemic heart disease and other diseases of the circulatory system: Secondary | ICD-10-CM

## 2014-08-11 DIAGNOSIS — E872 Acidosis: Secondary | ICD-10-CM | POA: Diagnosis present

## 2014-08-11 DIAGNOSIS — F17210 Nicotine dependence, cigarettes, uncomplicated: Secondary | ICD-10-CM | POA: Diagnosis present

## 2014-08-11 DIAGNOSIS — I2510 Atherosclerotic heart disease of native coronary artery without angina pectoris: Secondary | ICD-10-CM | POA: Diagnosis present

## 2014-08-11 DIAGNOSIS — I129 Hypertensive chronic kidney disease with stage 1 through stage 4 chronic kidney disease, or unspecified chronic kidney disease: Secondary | ICD-10-CM | POA: Diagnosis present

## 2014-08-11 DIAGNOSIS — Z8673 Personal history of transient ischemic attack (TIA), and cerebral infarction without residual deficits: Secondary | ICD-10-CM

## 2014-08-11 DIAGNOSIS — Z833 Family history of diabetes mellitus: Secondary | ICD-10-CM

## 2014-08-11 HISTORY — DX: Chronic obstructive pulmonary disease, unspecified: J44.9

## 2014-08-11 HISTORY — DX: Pure hypercholesterolemia, unspecified: E78.00

## 2014-08-11 HISTORY — DX: Essential (primary) hypertension: I10

## 2014-08-11 HISTORY — DX: Type 2 diabetes mellitus without complications: E11.9

## 2014-08-11 MED ORDER — TRAMADOL HCL 50 MG PO TABS
50.0000 mg | ORAL_TABLET | Freq: Once | ORAL | Status: AC
  Administered 2014-08-11: 50 mg via ORAL

## 2014-08-11 MED ORDER — TRAMADOL HCL 50 MG PO TABS
ORAL_TABLET | ORAL | Status: AC
  Administered 2014-08-11: 50 mg via ORAL
  Filled 2014-08-11: qty 1

## 2014-08-11 MED ORDER — ERYTHROMYCIN BASE 500 MG PO TABS: 500.0000 mg | ORAL_TABLET | Freq: Four times a day (QID) | ORAL | Status: AC

## 2014-08-11 MED ORDER — IBUPROFEN 800 MG PO TABS
ORAL_TABLET | ORAL | Status: AC
  Administered 2014-08-11: 800 mg via ORAL
  Filled 2014-08-11: qty 1

## 2014-08-11 MED ORDER — IBUPROFEN 800 MG PO TABS
800.0000 mg | ORAL_TABLET | Freq: Once | ORAL | Status: AC
  Administered 2014-08-11: 800 mg via ORAL

## 2014-08-11 MED ORDER — IBUPROFEN 800 MG PO TABS: 800.0000 mg | ORAL_TABLET | Freq: Three times a day (TID) | ORAL | Status: DC | PRN

## 2014-08-11 MED ORDER — TRAMADOL HCL 50 MG PO TABS: 50.0000 mg | ORAL_TABLET | Freq: Four times a day (QID) | ORAL | Status: DC | PRN

## 2014-08-11 NOTE — ED Notes (Signed)
States he has a couple of broken teeth and now has facial swelling to right side of face

## 2014-08-11 NOTE — ED Notes (Signed)
Swelling to left side of face instead of right

## 2014-08-11 NOTE — ED Provider Notes (Signed)
[facility] Emergency Department Provider Note  ____________________________________________  Time seen: Approximately 8:08 AM  I have reviewed the triage vital signs and the nursing notes.   HISTORY  Chief Complaint Dental Pain    HPI HENSLEY TREAT is a 44 y.o. male patient complaining of dental pain and left facial swelling which has increased in the past 2 days. Patient has a long history of devitalized teeth. Patient has not seen a dentist in 3-5 years. Patient denies any fever with this complaint. Patient rated his pain as 8/10.   Past Medical History  Diagnosis Date  . Hypertension   . Diabetes mellitus without complication   . COPD (chronic obstructive pulmonary disease)   . Hypercholesteremia unk    Patient Active Problem List   Diagnosis Date Noted  . Acute and chronic respiratory failure 04/22/2013  . Chronic airway obstruction, not elsewhere classified 04/22/2013  . Sarcoidosis 04/22/2013  . CHF (congestive heart failure) 04/22/2013  . Unspecified late effects of cerebrovascular disease 04/22/2013  . Other and unspecified hyperlipidemia 04/22/2013  . Tracheostomy complication, unspecified 04/22/2013    History reviewed. No pertinent past surgical history.  Current Outpatient Rx  Name  Route  Sig  Dispense  Refill  . albuterol (PROVENTIL HFA;VENTOLIN HFA) 108 (90 BASE) MCG/ACT inhaler   Inhalation   Inhale into the lungs every 6 (six) hours as needed for wheezing or shortness of breath.         Marland Kitchen atorvastatin (LIPITOR) 40 MG tablet   Oral   Take 40 mg by mouth daily.         . fluticasone-salmeterol (ADVAIR HFA) 45-21 MCG/ACT inhaler   Inhalation   Inhale 2 puffs into the lungs 2 (two) times daily.         . insulin detemir (LEVEMIR) 100 UNIT/ML injection   Subcutaneous   Inject into the skin at bedtime.         Marland Kitchen lisinopril (PRINIVIL,ZESTRIL) 10 MG tablet   Oral   Take 10 mg by mouth daily.         .  metoprolol (LOPRESSOR) 100 MG tablet   Oral   Take 100 mg by mouth 2 (two) times daily.         Marland Kitchen erythromycin base (E-MYCIN) 500 MG tablet   Oral   Take 1 tablet (500 mg total) by mouth 4 (four) times daily.   40 tablet   0   . fentaNYL (DURAGESIC - DOSED MCG/HR) 25 MCG/HR patch      Apply one patch every 72 hours. Remove old patch. External Use only. Rotate sites. Check placement of patch every shift   10 patch   0   . ibuprofen (ADVIL,MOTRIN) 800 MG tablet   Oral   Take 1 tablet (800 mg total) by mouth every 8 (eight) hours as needed for moderate pain.   15 tablet   0   . traMADol (ULTRAM) 50 MG tablet   Oral   Take 1 tablet (50 mg total) by mouth every 6 (six) hours as needed.   20 tablet   0     Allergies Penicillins  No family history on file.  Social History History  Substance Use Topics  . Smoking status: Current Some Day Smoker  . Smokeless tobacco: Not on file  . Alcohol Use: Yes     Comment: occassional    Review of Systems Constitutional: No fever/chills Eyes: No visual changes. ENT: No sore throat. Cardiovascular: Denies chest pain. Respiratory:  Denies shortness of breath. Gastrointestinal: No abdominal pain.  No nausea, no vomiting.  No diarrhea.  No constipation. Genitourinary: Negative for dysuria. Musculoskeletal: Negative for back pain. Skin: Negative for rash. Neurological: Negative for headaches, focal weakness or numbness. Endocrine:Diabetes, hypertension, and hyperlipidemia. Hematological/Lymphatic: Allergic/Immunilogical:10-point ROS otherwise negative.  ____________________________________________   PHYSICAL EXAM:  VITAL SIGNS: ED Triage Vitals  Enc Vitals Group     BP 08/11/14 0746 173/95 mmHg     Pulse Rate 08/11/14 0746 91     Resp 08/11/14 0746 20     Temp 08/11/14 0746 98.5 F (36.9 C)     Temp Source 08/11/14 0746 Oral     SpO2 08/11/14 0746 99 %     Weight 08/11/14 0746 280 lb (127.007 kg)     Height  08/11/14 0746 6' (1.829 m)     Head Cir --      Peak Flow --      Pain Score 08/11/14 0747 8     Pain Loc --      Pain Edu? --      Excl. in [place]? --     Constitutional: Alert and oriented. Well appearing and in no acute distress. Eyes: Conjunctivae are normal. PERRL. EOMI. Head: Atraumatic. Nose: No congestion/rhinnorhea. Mouth/Throat: Mucous membranes are moist.  Oropharynx non-erythematous. Multiple devitalized teeth left upper maxillary area. Gingiva is erythematous and edematous. Neck: No stridor. No cervical spine tenderness to palpation. Hematological/Lymphatic/Immunilogical: No cervical lymphadenopathy. Cardiovascular: Normal rate, regular rhythm. Grossly normal heart sounds.  Good peripheral circulation. The pressure elevated Respiratory: Normal respiratory effort.  No retractions. Lungs CTAB. Gastrointestinal: Soft and nontender. No distention. No abdominal bruits. No CVA tenderness. Musculoskeletal: No lower extremity tenderness nor edema.  No joint effusions. Neurologic:  Normal speech and language. No gross focal neurologic deficits are appreciated. Speech is normal. No gait instability. Skin:  Skin is warm, dry and intact. No rash noted. Psychiatric: Mood and affect are normal. Speech and behavior are normal.  ____________________________________________   LABS (all labs ordered are listed, but only abnormal results are displayed)  Labs Reviewed - No data to display ____________________________________________  EKG   ____________________________________________  RADIOLOGY   ____________________________________________   PROCEDURES  Procedure(s) performed: None  Critical Care performed: No  ____________________________________________   INITIAL IMPRESSION / ASSESSMENT AND PLAN / ED COURSE  Pertinent labs & imaging results that were available during my care of the patient were reviewed by me and considered in my medical decision making (see chart for  details). OPTIONS FOR DENTAL FOLLOW UP CARE  [place] [facility] and [place] [employer] [phone])  [facility] [phone])  [place] [phone])  [place] [phone])  [facility] [phone]) This clinic caters to the indigent population and is on a lottery system. Location: [employer] [facility], [employer], [place], [place] Clinic Hours: Wednesdays from 6pm - 9pm, patients seen by a lottery system. For dates, call or go to [email or web address] Services: Cleanings, fillings and simple extractions. Payment Options: DENTAL WORK IS FREE OF CHARGE. Bring proof of income or support. Best way to get seen: Arrive at 5:15 pm - this is a lottery, NOT first come/first serve, so arriving earlier will not increase your chances of being seen.     [facility] [phone] Select option 1 for emergencies   Location: [facility], [place], [place]  [address], [place] Clinic Hours: No walk-ins accepted - call the day before to schedule an appointment. Check in times are 9:30 am and 1:30 pm. Services: Simple extractions, temporary fillings, pulpectomy/pulp debridement, uncomplicated abscess drainage. Payment Options: PAYMENT IS DUE AT THE TIME OF SERVICE.  Fee is usually $100-200, additional surgical procedures (e.g. abscess drainage) may be extra. Cash, checks, Visa/MasterCard accepted.  Can file Medicaid if patient is covered for dental - patient should call case worker to check. No discount for [facility] patients. Best way to get seen: MUST call the day before and get onto the schedule. Can usually be seen the next 1-2 days. No walk-ins accepted.     [place] [phone]    Location: [place], [place] Clinic Hours: M, W, Th, F 8am or 1:30pm, Tues 9a or 1:30 - first come/first served. Services: Simple extractions, temporary fillings, uncomplicated abscess drainage.  You do not need to be an [facility] resident. Payment Options: PAYMENT IS DUE AT THE TIME OF SERVICE. Dental insurance, otherwise sliding scale - bring proof of income or support. Depending on income and treatment needed, cost is usually $50-200. Best way to get seen: Arrive early as it is first come/first served.     [facility] [phone]   Location: [place] [facility] Hours: Mon-Thu 8a-5p Services: Most basic dental services including extractions and fillings. Payment Options: PAYMENT IS DUE AT THE TIME OF SERVICE. Sliding scale, up to 50% off - bring proof if income or support. Medicaid with dental option accepted. Best way to get seen: Call to schedule an appointment, can usually be seen within 2 weeks OR they will try to see walk-ins - show up at [place] or 2p (you may have to wait).     [facility] [place] RESIDENTS ONLY   Location: [facility], [place] [address], [place], [place] [address] Clinic Hours: By appointment only. Monday - Thursday 8am-5pm, Friday 8am-12pm Services: Cleanings, fillings, extractions. Payment Options: PAYMENT IS DUE AT THE TIME OF SERVICE. Cash, Visa or MasterCard. Sliding scale - $30 minimum per service. Best way to get seen: Come in to office, complete packet and make an appointment - need proof of income or support monies for each household member and proof of [facility] residence. Usually takes about a month to get in.     [facility] [phone]   Location: [address], [place] Clinic Hours: Walk-in Urgent Care Dental Services are offered Monday-Friday  mornings only. The numbers of emergencies accepted daily is limited to the number of providers available. Maximum 15 - Mondays, Wednesdays & Thursdays Maximum 10 - Tuesdays & Fridays Services: You do not need to be a [facility] resident to be seen for a dental emergency. Emergencies are defined as pain, swelling, abnormal bleeding, or dental trauma. Walkins will receive x-rays if needed. NOTE: Dental cleaning is not an emergency. Payment Options: PAYMENT IS DUE AT THE TIME OF SERVICE. Minimum co-pay is $40.00 for uninsured patients. Minimum co-pay is $3.00 for Medicaid with dental coverage. Dental Insurance is accepted and must be presented at time of visit. Medicare does not cover dental. Forms of payment: Cash, credit card, checks. Best way to get seen: If not previously registered with the clinic, walk-in dental registration begins at 7:15 am and is on a first come/first serve basis. If previously registered with the clinic, call to make an  appointment.     [facility] [place] ONLY   Location: [address] [place], [place] Clinic Hours: Mon-Thu 10a-2p Services: Extractions only! Payment Options: FREE (donations accepted) - bring proof of income or support Best way to get seen: Call and schedule an appointment OR come at 8am on the 1st Monday of every month (except for holidays) when it is first come/first served.     [facility] [phone]   Location: [place], [place] Clinic Hours: Friday mornings Services, Payment Options, Best way to get seen: Call for info   Dental pain secondary to devitalize tooth. Patient given a list of dental clinics. Patient discharged with erythromycin, ibuprofen, and tramadol. Patient advised return back if there is increased swelling or fever. ____________________________________________   FINAL CLINICAL IMPRESSION(S) / ED DIAGNOSES  Final diagnoses:  Abscess, dental       Sable Feil, PA-C 08/11/14 2334  Carrie Mew, MD 08/11/14 1520

## 2014-08-11 NOTE — Discharge Instructions (Signed)
OPTIONS FOR DENTAL FOLLOW UP CARE ° °[place] [facility] and Human [facility] - Local Safety Net Dental Clinics °http://www.ncdhhs.gov/dph/oralhealth/services/safetynetclinics.htm °  °[facility] (336-562-3123) ° °[facility] ([phone]) ° °[facility] ([phone]) ° °[facility] ([phone]) ° °[facility] ([phone]) °This clinic caters to the indigent population and is on a lottery system. °Location: °[facility], [address], [place] °Clinic Hours: °Wednesdays from 6pm - 9pm, patients seen by a lottery system. °For dates, call or go to www.med.[facility].edu/shac/patients/Dental-[facility] °Services: °Cleanings, fillings and simple extractions. °Payment Options: °DENTAL WORK IS FREE OF CHARGE. Bring proof of income or support. °Best way to get seen: °Arrive at 5:15 pm - this is a lottery, NOT first come/first serve, so arriving earlier will not increase your chances of being seen. °  °  °[facility] °[phone] °Select option 1 for emergencies °  °Location: °[facility], [address], [place] °Clinic Hours: °No walk-ins accepted - call the day before to schedule an appointment. °Check in times are 9:30 am and 1:30 pm. °Services: °Simple extractions, temporary fillings, pulpectomy/pulp debridement, uncomplicated abscess drainage. °Payment Options: °PAYMENT IS DUE AT THE TIME OF SERVICE.  Fee is usually $100-200, additional surgical procedures (e.g. abscess drainage) may be extra. °Cash, checks, Visa/MasterCard accepted.  Can file Medicaid if patient is covered for dental - patient should call case worker to check. °No discount for [facility] patients. °Best way to get seen: °MUST call the day before and get onto the schedule. Can usually be seen the next 1-2 days. No walk-ins accepted. °  °  °[facility] °[phone] °   °Location: °[facility], [address], [place] °Clinic Hours: °M, W, Th, F 8am or 1:30pm, Tues 9a or 1:30 - first come/first served. °Services: °Simple extractions, temporary fillings, uncomplicated abscess drainage.  You do not need to be an [place] resident. °Payment Options: °PAYMENT IS DUE AT THE TIME OF SERVICE. °Dental insurance, otherwise sliding scale - bring proof of income or support. °Depending on income and treatment needed, cost is usually $50-200. °Best way to get seen: °Arrive early as it is first come/first served. °  °  °[facility] °[phone] °  °Location: °[address] °Clinic Hours: °Mon-Thu 8a-5p °Services: °Most basic dental services including extractions and fillings. °Payment Options: °PAYMENT IS DUE AT THE TIME OF SERVICE. °Sliding scale, up to 50% off - bring proof if income or support. °Medicaid with dental option accepted. °Best way to get seen: °Call to schedule an appointment, can usually be seen within 2 weeks OR they will try to see walk-ins - show up at 8a or 2p (you may have to wait). °  °  °[facility] °[phone] °[place] RESIDENTS ONLY °  °Location: °[facility], [address], [place], [place] [address] °Clinic Hours: By appointment only. °Monday - Thursday 8am-5pm, Friday 8am-12pm °Services: Cleanings, fillings, extractions. °Payment Options: °PAYMENT IS DUE AT THE TIME OF SERVICE. °Cash, Visa or MasterCard. Sliding scale - $30 minimum per service. °Best way to get seen: °Come in to office, complete packet and make an appointment - need proof of income °or support monies for each household member and proof of [place] residence. °Usually takes about a month to get in. °  °  °[facility] °[phone] °  °Location: °[address]   [place] Clinic Hours: Walk-in Urgent Care Dental Services are offered Monday-Friday  mornings only. The numbers of emergencies accepted daily is limited to the number of providers available. Maximum 15 - Mondays, Wednesdays & Thursdays Maximum 10 - Tuesdays & Fridays Services: You do not need to be a [facility] resident to be seen for a dental emergency. Emergencies are defined as pain, swelling, abnormal bleeding, or dental trauma. Walkins will receive x-rays if needed. NOTE: Dental cleaning is not an emergency. Payment Options: PAYMENT IS DUE AT THE TIME OF SERVICE. Minimum co-pay is $40.00 for uninsured patients. Minimum co-pay is $3.00 for Medicaid with dental coverage. Dental Insurance is accepted and must be presented at time of visit. Medicare does not cover dental. Forms of payment: Cash, credit card, checks. Best way to get seen: If not previously registered with the clinic, walk-in dental registration begins at 7:15 am and is on a first come/first serve basis. If previously registered with the clinic, call to make an appointment.     [facility] [place] ONLY   Location: [address] [place], [place] Clinic Hours: Mon-Thu 10a-2p Services: Extractions only! Payment Options: FREE (donations accepted) - bring proof of income or support Best way to get seen: Call and schedule an appointment OR come at 8am on the 1st Monday of every month (except for holidays) when it is first come/first served.     [facility] [phone]   Location: [place], [place] Clinic Hours: Friday mornings Services, Payment Options, Best way to get seen: Call for info  Abscess An abscess (boil or furuncle) is an infected area on or under the skin. This area is filled with yellowish-white fluid (pus) and other material (debris). HOME CARE   Only take medicines as told by your doctor.  If you were given antibiotic medicine, take it as directed. Finish the medicine even if you start to feel better.  If gauze is  used, follow your doctor's directions for changing the gauze.  To avoid spreading the infection:  Keep your abscess covered with a bandage.  Wash your hands well.  Do not share personal care items, towels, or whirlpools with others.  Avoid skin contact with others.  Keep your skin and clothes clean around the abscess.  Keep all doctor visits as told. GET HELP RIGHT AWAY IF:   You have more pain, puffiness (swelling), or redness in the wound site.  You have more fluid or blood coming from the wound site.  You have muscle aches, chills, or you feel sick.  You have a fever. MAKE SURE YOU:   Understand these instructions.  Will watch your condition.  Will get help right away if you are not doing well or get worse. Document Released: 07/11/2007 Document Revised: 07/24/2011 Document Reviewed: 04/06/2011 [facility] Patient Information 2015 [place], [place]. This information is not intended to replace advice given to you by your health care provider. Make sure you discuss any questions you have with your health care provider.

## 2014-08-14 ENCOUNTER — Other Ambulatory Visit: Payer: Self-pay

## 2014-08-14 ENCOUNTER — Emergency Department: Payer: Medicare Other

## 2014-08-14 ENCOUNTER — Encounter: Payer: Self-pay | Admitting: Emergency Medicine

## 2014-08-14 ENCOUNTER — Inpatient Hospital Stay: Payer: Medicare Other

## 2014-08-14 ENCOUNTER — Inpatient Hospital Stay
Admission: EM | Admit: 2014-08-14 | Discharge: 2014-08-18 | DRG: 189 | Disposition: A | Discharge status: Home / Self Care | Payer: Medicare Other | Attending: Internal Medicine | Admitting: Internal Medicine

## 2014-08-14 DIAGNOSIS — N189 Chronic kidney disease, unspecified: Secondary | ICD-10-CM | POA: Diagnosis present

## 2014-08-14 DIAGNOSIS — F17210 Nicotine dependence, cigarettes, uncomplicated: Secondary | ICD-10-CM | POA: Diagnosis present

## 2014-08-14 DIAGNOSIS — J441 Chronic obstructive pulmonary disease with (acute) exacerbation: Secondary | ICD-10-CM | POA: Diagnosis present

## 2014-08-14 DIAGNOSIS — Z7951 Long term (current) use of inhaled steroids: Secondary | ICD-10-CM | POA: Diagnosis not present

## 2014-08-14 DIAGNOSIS — E872 Acidosis: Secondary | ICD-10-CM | POA: Diagnosis present

## 2014-08-14 DIAGNOSIS — Z8673 Personal history of transient ischemic attack (TIA), and cerebral infarction without residual deficits: Secondary | ICD-10-CM | POA: Diagnosis not present

## 2014-08-14 DIAGNOSIS — E1165 Type 2 diabetes mellitus with hyperglycemia: Secondary | ICD-10-CM | POA: Diagnosis present

## 2014-08-14 DIAGNOSIS — Z9049 Acquired absence of other specified parts of digestive tract: Secondary | ICD-10-CM | POA: Diagnosis present

## 2014-08-14 DIAGNOSIS — D869 Sarcoidosis, unspecified: Secondary | ICD-10-CM | POA: Diagnosis present

## 2014-08-14 DIAGNOSIS — M7989 Other specified soft tissue disorders: Secondary | ICD-10-CM | POA: Diagnosis present

## 2014-08-14 DIAGNOSIS — R609 Edema, unspecified: Secondary | ICD-10-CM

## 2014-08-14 DIAGNOSIS — J9601 Acute respiratory failure with hypoxia: Secondary | ICD-10-CM | POA: Diagnosis present

## 2014-08-14 DIAGNOSIS — E875 Hyperkalemia: Secondary | ICD-10-CM | POA: Diagnosis present

## 2014-08-14 DIAGNOSIS — I129 Hypertensive chronic kidney disease with stage 1 through stage 4 chronic kidney disease, or unspecified chronic kidney disease: Secondary | ICD-10-CM | POA: Diagnosis present

## 2014-08-14 DIAGNOSIS — Z8249 Family history of ischemic heart disease and other diseases of the circulatory system: Secondary | ICD-10-CM | POA: Diagnosis not present

## 2014-08-14 DIAGNOSIS — J479 Bronchiectasis, uncomplicated: Secondary | ICD-10-CM | POA: Diagnosis present

## 2014-08-14 DIAGNOSIS — I2510 Atherosclerotic heart disease of native coronary artery without angina pectoris: Secondary | ICD-10-CM | POA: Diagnosis present

## 2014-08-14 DIAGNOSIS — Z880 Allergy status to penicillin: Secondary | ICD-10-CM | POA: Diagnosis not present

## 2014-08-14 DIAGNOSIS — R0902 Hypoxemia: Secondary | ICD-10-CM

## 2014-08-14 DIAGNOSIS — G4733 Obstructive sleep apnea (adult) (pediatric): Secondary | ICD-10-CM | POA: Diagnosis present

## 2014-08-14 DIAGNOSIS — E780 Pure hypercholesterolemia: Secondary | ICD-10-CM | POA: Diagnosis present

## 2014-08-14 DIAGNOSIS — Z794 Long term (current) use of insulin: Secondary | ICD-10-CM | POA: Diagnosis not present

## 2014-08-14 DIAGNOSIS — Z833 Family history of diabetes mellitus: Secondary | ICD-10-CM | POA: Diagnosis not present

## 2014-08-14 DIAGNOSIS — Z79899 Other long term (current) drug therapy: Secondary | ICD-10-CM | POA: Diagnosis not present

## 2014-08-14 DIAGNOSIS — Z9119 Patient's noncompliance with other medical treatment and regimen: Secondary | ICD-10-CM | POA: Diagnosis present

## 2014-08-14 DIAGNOSIS — E669 Obesity, unspecified: Secondary | ICD-10-CM | POA: Diagnosis present

## 2014-08-14 DIAGNOSIS — Z716 Tobacco abuse counseling: Secondary | ICD-10-CM | POA: Diagnosis present

## 2014-08-14 DIAGNOSIS — K047 Periapical abscess without sinus: Secondary | ICD-10-CM | POA: Diagnosis present

## 2014-08-14 DIAGNOSIS — J9602 Acute respiratory failure with hypercapnia: Secondary | ICD-10-CM | POA: Diagnosis present

## 2014-08-14 HISTORY — DX: Cerebral infarction, unspecified: I63.9

## 2014-08-14 HISTORY — DX: Sarcoidosis, unspecified: D86.9

## 2014-08-14 HISTORY — DX: Sleep apnea, unspecified: G47.30

## 2014-08-14 HISTORY — DX: Atherosclerotic heart disease of native coronary artery without angina pectoris: I25.10

## 2014-08-14 LAB — PROTIME-INR
INR: 1.02
PROTHROMBIN TIME: 13.6 s (ref 11.4–15.0)

## 2014-08-14 LAB — COMPREHENSIVE METABOLIC PANEL
ALK PHOS: 79 U/L (ref 38–126)
ALT: 23 U/L (ref 17–63)
AST: 24 U/L (ref 15–41)
Albumin: 3.7 g/dL (ref 3.5–5.0)
Anion gap: 5 (ref 5–15)
BUN: 19 mg/dL (ref 6–20)
CHLORIDE: 103 mmol/L (ref 101–111)
CO2: 34 mmol/L — ABNORMAL HIGH (ref 22–32)
Calcium: 8.6 mg/dL — ABNORMAL LOW (ref 8.9–10.3)
Creatinine, Ser: 1.37 mg/dL — ABNORMAL HIGH (ref 0.61–1.24)
GFR calc Af Amer: >60 mL/min (ref >60)
GFR calc non Af Amer: >60 mL/min (ref >60)
Glucose, Bld: 127 mg/dL — ABNORMAL HIGH (ref 65–99)
POTASSIUM: 4.3 mmol/L (ref 3.5–5.1)
Sodium: 142 mmol/L (ref 135–145)
Total Bilirubin: 0.5 mg/dL (ref 0.3–1.2)
Total Protein: 7.6 g/dL (ref 6.5–8.1)

## 2014-08-14 LAB — CBC
HCT: 52.2 % — ABNORMAL HIGH (ref 40.0–52.0)
Hemoglobin: 16.4 g/dL (ref 13.0–18.0)
MCH: 26.8 pg (ref 26.0–34.0)
MCHC: 31.5 g/dL — AB (ref 32.0–36.0)
MCV: 85.2 fL (ref 80.0–100.0)
PLATELETS: 227 10*3/uL (ref 150–440)
RBC: 6.12 MIL/uL — AB (ref 4.40–5.90)
RDW: 15.1 % — ABNORMAL HIGH (ref 11.5–14.5)
WBC: 8.7 10*3/uL (ref 3.8–10.6)

## 2014-08-14 LAB — BLOOD GAS, ARTERIAL
ALLENS TEST (PASS/FAIL): POSITIVE — AB
Acid-Base Excess: 4.4 mmol/L — ABNORMAL HIGH (ref 0.0–3.0)
BICARBONATE: 35.9 meq/L — AB (ref 21.0–28.0)
FIO2: 0.32 %
O2 SAT: 88.7 %
PCO2 ART: 94 mmHg — AB (ref 32.0–48.0)
PH ART: 7.19 — AB (ref 7.350–7.450)
PO2 ART: 69 mmHg — AB (ref 83.0–108.0)
Patient temperature: 37

## 2014-08-14 LAB — GLUCOSE, CAPILLARY
GLUCOSE-CAPILLARY: 202 mg/dL — AB (ref 65–99)
GLUCOSE-CAPILLARY: 220 mg/dL — AB (ref 65–99)

## 2014-08-14 LAB — BRAIN NATRIURETIC PEPTIDE: B NATRIURETIC PEPTIDE 5: 68 pg/mL (ref 0.0–100.0)

## 2014-08-14 LAB — TROPONIN I: Troponin I: 0.05 ng/mL — ABNORMAL HIGH (ref <0.031)

## 2014-08-14 MED ORDER — METHYLPREDNISOLONE SODIUM SUCC 125 MG IJ SOLR
125.0000 mg | Freq: Once | INTRAMUSCULAR | Status: AC
  Administered 2014-08-14: 125 mg via INTRAVENOUS

## 2014-08-14 MED ORDER — METHYLPREDNISOLONE SODIUM SUCC 125 MG IJ SOLR: 60.0000 mg | Freq: Four times a day (QID) | INTRAMUSCULAR | Status: DC

## 2014-08-14 MED ORDER — METHYLPREDNISOLONE SODIUM SUCC 125 MG IJ SOLR
INTRAMUSCULAR | Status: AC
  Administered 2014-08-14: 125 mg via INTRAVENOUS
  Filled 2014-08-14: qty 2

## 2014-08-14 MED ORDER — ALBUTEROL SULFATE (2.5 MG/3ML) 0.083% IN NEBU
INHALATION_SOLUTION | RESPIRATORY_TRACT | Status: AC
  Administered 2014-08-14: 2.5 mg via RESPIRATORY_TRACT
  Filled 2014-08-14: qty 3

## 2014-08-14 MED ORDER — ATORVASTATIN CALCIUM 20 MG PO TABS
40.0000 mg | ORAL_TABLET | Freq: Every day | ORAL | Status: DC
  Administered 2014-08-14 – 2014-08-18 (×3): 40 mg via ORAL
  Filled 2014-08-14 (×5): qty 2

## 2014-08-14 MED ORDER — INSULIN ASPART 100 UNIT/ML ~~LOC~~ SOLN
0.0000 [IU] | Freq: Three times a day (TID) | SUBCUTANEOUS | Status: DC
  Administered 2014-08-14 – 2014-08-15 (×3): 3 [IU] via SUBCUTANEOUS
  Administered 2014-08-15: 2 [IU] via SUBCUTANEOUS
  Administered 2014-08-15: 7 [IU] via SUBCUTANEOUS
  Administered 2014-08-16: 5 [IU] via SUBCUTANEOUS
  Administered 2014-08-16: 3 [IU] via SUBCUTANEOUS
  Administered 2014-08-16 – 2014-08-17 (×2): 5 [IU] via SUBCUTANEOUS
  Administered 2014-08-17: 9 [IU] via SUBCUTANEOUS
  Administered 2014-08-17 – 2014-08-18 (×2): 3 [IU] via SUBCUTANEOUS
  Administered 2014-08-18: 5 [IU] via SUBCUTANEOUS
  Filled 2014-08-14 (×2): qty 3
  Filled 2014-08-14: qty 5
  Filled 2014-08-14: qty 3
  Filled 2014-08-14: qty 7
  Filled 2014-08-14 (×2): qty 3
  Filled 2014-08-14: qty 2
  Filled 2014-08-14: qty 5
  Filled 2014-08-14: qty 2
  Filled 2014-08-14: qty 3
  Filled 2014-08-14: qty 9
  Filled 2014-08-14 (×2): qty 3
  Filled 2014-08-14: qty 5

## 2014-08-14 MED ORDER — HEPARIN SODIUM (PORCINE) 5000 UNIT/ML IJ SOLN
5000.0000 [IU] | Freq: Three times a day (TID) | INTRAMUSCULAR | Status: DC
  Administered 2014-08-14 – 2014-08-16 (×6): 5000 [IU] via SUBCUTANEOUS
  Filled 2014-08-14 (×6): qty 1

## 2014-08-14 MED ORDER — METOPROLOL TARTRATE 100 MG PO TABS
100.0000 mg | ORAL_TABLET | Freq: Two times a day (BID) | ORAL | Status: DC
  Administered 2014-08-14 – 2014-08-18 (×9): 100 mg via ORAL
  Filled 2014-08-14 (×9): qty 1

## 2014-08-14 MED ORDER — MOMETASONE FURO-FORMOTEROL FUM 100-5 MCG/ACT IN AERO
2.0000 | INHALATION_SPRAY | Freq: Two times a day (BID) | RESPIRATORY_TRACT | Status: DC
  Administered 2014-08-15 – 2014-08-18 (×7): 2 via RESPIRATORY_TRACT
  Filled 2014-08-14: qty 8.8

## 2014-08-14 MED ORDER — ALBUTEROL SULFATE (2.5 MG/3ML) 0.083% IN NEBU
INHALATION_SOLUTION | RESPIRATORY_TRACT | Status: AC
  Administered 2014-08-14: 5 mg via RESPIRATORY_TRACT
  Filled 2014-08-14: qty 3

## 2014-08-14 MED ORDER — IPRATROPIUM-ALBUTEROL 0.5-2.5 (3) MG/3ML IN SOLN
3.0000 mL | RESPIRATORY_TRACT | Status: DC
  Administered 2014-08-14 – 2014-08-15 (×7): 3 mL via RESPIRATORY_TRACT
  Filled 2014-08-14 (×6): qty 3

## 2014-08-14 MED ORDER — TIOTROPIUM BROMIDE MONOHYDRATE 18 MCG IN CAPS
18.0000 ug | ORAL_CAPSULE | Freq: Every day | RESPIRATORY_TRACT | Status: DC
  Administered 2014-08-15 – 2014-08-18 (×4): 18 ug via RESPIRATORY_TRACT
  Filled 2014-08-14: qty 5

## 2014-08-14 MED ORDER — METHYLPREDNISOLONE SODIUM SUCC 125 MG IJ SOLR
60.0000 mg | Freq: Four times a day (QID) | INTRAMUSCULAR | Status: DC
  Administered 2014-08-14 – 2014-08-18 (×15): 60 mg via INTRAVENOUS
  Filled 2014-08-14 (×15): qty 2

## 2014-08-14 MED ORDER — BUDESONIDE 0.5 MG/2ML IN SUSP
0.2500 mg | Freq: Two times a day (BID) | RESPIRATORY_TRACT | Status: DC
  Administered 2014-08-15 – 2014-08-16 (×2): 0.5 mg via RESPIRATORY_TRACT
  Filled 2014-08-14 (×3): qty 2

## 2014-08-14 MED ORDER — FENTANYL 25 MCG/HR TD PT72
25.0000 ug | MEDICATED_PATCH | TRANSDERMAL | Status: DC
  Administered 2014-08-14: 25 ug via TRANSDERMAL
  Filled 2014-08-14 (×2): qty 1

## 2014-08-14 MED ORDER — ALBUTEROL SULFATE (2.5 MG/3ML) 0.083% IN NEBU
5.0000 mg | INHALATION_SOLUTION | Freq: Once | RESPIRATORY_TRACT | Status: AC
  Administered 2014-08-14: 5 mg via RESPIRATORY_TRACT

## 2014-08-14 MED ORDER — ALBUTEROL SULFATE (2.5 MG/3ML) 0.083% IN NEBU
INHALATION_SOLUTION | RESPIRATORY_TRACT | Status: AC
  Filled 2014-08-14: qty 3

## 2014-08-14 MED ORDER — TRAMADOL HCL 50 MG PO TABS: 50.0000 mg | ORAL_TABLET | Freq: Four times a day (QID) | ORAL | Status: DC | PRN

## 2014-08-14 MED ORDER — LISINOPRIL 10 MG PO TABS
10.0000 mg | ORAL_TABLET | Freq: Every day | ORAL | Status: DC
  Administered 2014-08-14 – 2014-08-16 (×3): 10 mg via ORAL
  Filled 2014-08-14 (×3): qty 1

## 2014-08-14 MED ORDER — ERYTHROMYCIN BASE 250 MG PO TABS
500.0000 mg | ORAL_TABLET | Freq: Four times a day (QID) | ORAL | Status: DC
  Administered 2014-08-14 – 2014-08-17 (×15): 500 mg via ORAL
  Filled 2014-08-14 (×18): qty 2

## 2014-08-14 MED ORDER — IPRATROPIUM-ALBUTEROL 0.5-2.5 (3) MG/3ML IN SOLN
RESPIRATORY_TRACT | Status: AC
  Filled 2014-08-14: qty 3

## 2014-08-14 MED ORDER — ASPIRIN 81 MG PO CHEW
81.0000 mg | CHEWABLE_TABLET | Freq: Every day | ORAL | Status: DC
  Administered 2014-08-14 – 2014-08-18 (×5): 81 mg via ORAL
  Filled 2014-08-14 (×5): qty 1

## 2014-08-14 MED ORDER — ALBUTEROL SULFATE (2.5 MG/3ML) 0.083% IN NEBU
2.5000 mg | INHALATION_SOLUTION | Freq: Once | RESPIRATORY_TRACT | Status: AC
  Administered 2014-08-14: 2.5 mg via RESPIRATORY_TRACT

## 2014-08-14 NOTE — ED Notes (Signed)
Critical Troponin: 0.05 , Dr Clearnce Hasten notified

## 2014-08-14 NOTE — Progress Notes (Signed)
Notified MD on pt condition, pt seeming lethargic throughout shift. Pt O2 sat 92% on 2L. Received new order to obtain ABG and to follow up with results.

## 2014-08-14 NOTE — ED Provider Notes (Signed)
[facility] Emergency Department Provider Note ____________________________________________  Time seen: Approximately 530 AM  I have reviewed the triage vital signs and the nursing notes.   HISTORY  Chief Complaint Chest Pain and Shortness of Breath    HPI [name] is a 44 y.o. male with a history of COPD and CAD who presents today with shortness of breath over the last 2 days. He says that it is associated with bilateral lower extremity swelling. He denies any chest pain at this time. Says he is compliant with his medications. Says that the shortness of breath is worsened with exertion. No cough or fever.   Past Medical History  Diagnosis Date  . Hypertension   . Diabetes mellitus without complication   . COPD (chronic obstructive pulmonary disease)   . Hypercholesteremia unk    Patient Active Problem List   Diagnosis Date Noted  . Acute and chronic respiratory failure 04/22/2013  . Chronic airway obstruction, not elsewhere classified 04/22/2013  . Sarcoidosis 04/22/2013  . CHF (congestive heart failure) 04/22/2013  . Unspecified late effects of cerebrovascular disease 04/22/2013  . Other and unspecified hyperlipidemia 04/22/2013  . Tracheostomy complication, unspecified 04/22/2013    Past Surgical History  Procedure Laterality Date  . Appendectomy      Current Outpatient Rx  Name  Route  Sig  Dispense  Refill  . albuterol (PROVENTIL HFA;VENTOLIN HFA) 108 (90 BASE) MCG/ACT inhaler   Inhalation   Inhale into the lungs every 6 (six) hours as needed for wheezing or shortness of breath.         Marland Kitchen atorvastatin (LIPITOR) 40 MG tablet   Oral   Take 40 mg by mouth daily.         Marland Kitchen erythromycin base (E-MYCIN) 500 MG tablet   Oral   Take 1 tablet (500 mg total) by mouth 4 (four) times daily.   40 tablet   0   . fluticasone-salmeterol (ADVAIR HFA) 45-21 MCG/ACT inhaler   Inhalation   Inhale 2 puffs into the lungs 2 (two) times  daily.         Marland Kitchen ibuprofen (ADVIL,MOTRIN) 800 MG tablet   Oral   Take 1 tablet (800 mg total) by mouth every 8 (eight) hours as needed for moderate pain.   15 tablet   0   . insulin detemir (LEVEMIR) 100 UNIT/ML injection   Subcutaneous   Inject into the skin at bedtime.         Marland Kitchen lisinopril (PRINIVIL,ZESTRIL) 10 MG tablet   Oral   Take 10 mg by mouth daily.         . metoprolol (LOPRESSOR) 100 MG tablet   Oral   Take 100 mg by mouth 2 (two) times daily.         . traMADol (ULTRAM) 50 MG tablet   Oral   Take 1 tablet (50 mg total) by mouth every 6 (six) hours as needed.   20 tablet   0   . fentaNYL (DURAGESIC - DOSED MCG/HR) 25 MCG/HR patch      Apply one patch every 72 hours. Remove old patch. External Use only. Rotate sites. Check placement of patch every shift   10 patch   0     Allergies Penicillins  History reviewed. No pertinent family history.  Social History History  Substance Use Topics  . Smoking status: Current Some Day Smoker -- 0.33 packs/day    Types: Cigarettes  . Smokeless tobacco: Not on file  .  Alcohol Use: Yes     Comment: occassional    Review of Systems Constitutional: No fever/chills Eyes: No visual changes. ENT: No sore throat. Cardiovascular: Denies chest pain. Respiratory: As above  Gastrointestinal: No abdominal pain.  No nausea, no vomiting.  No diarrhea.  No constipation. Genitourinary: Negative for dysuria. Musculoskeletal: Negative for back pain. Skin: Negative for rash. Neurological: Negative for headaches, focal weakness or numbness.  10-point ROS otherwise negative.  ____________________________________________   PHYSICAL EXAM:  VITAL SIGNS: ED Triage Vitals  Enc Vitals Group     BP 08/14/14 0441 166/105 mmHg     Pulse Rate 08/14/14 0441 87     Resp 08/14/14 0441 20     Temp 08/14/14 0441 98.3 F (36.8 C)     Temp Source 08/14/14 0441 Oral     SpO2 08/14/14 0441 85 %     Weight 08/14/14 0441 280 lb  (127.007 kg)     Height 08/14/14 0441 6' (1.829 m)     Head Cir --      Peak Flow --      Pain Score 08/14/14 0522 0     Pain Loc --      Pain Edu? --      Excl. in [place]? --     Constitutional: Alert and oriented. Well appearing and in no acute distress. Eyes: Conjunctivae are normal. PERRL. EOMI. Head: Atraumatic. Nose: No congestion/rhinnorhea. Mouth/Throat: Mucous membranes are moist.  Oropharynx non-erythematous. Neck: No stridor.   Cardiovascular: Normal rate, regular rhythm. Grossly normal heart sounds.  Good peripheral circulation. Respiratory: Normal respiratory effort.  No retractions. Rales to the mid and lower lung fields with expiratory wheezes. Decreased air movement throughout. Patient desaturates to 80% when taken off of the nasal cannula oxygen. Gastrointestinal: Soft and nontender. No distention. No abdominal bruits. No CVA tenderness. Musculoskeletal: No lower extremity tenderness .  No joint effusions. Mild to moderate bilateral lower extremity pitting edema. Neurologic:  Normal speech and language. No gross focal neurologic deficits are appreciated. Speech is normal. No gait instability. Skin:  Skin is warm, dry and intact. No rash noted. Psychiatric: Mood and affect are normal. Speech and behavior are normal.  ____________________________________________   LABS (all labs ordered are listed, but only abnormal results are displayed)  Labs Reviewed  CBC - Abnormal; Notable for the following:    RBC 6.12 (*)    HCT 52.2 (*)    MCHC 31.5 (*)    RDW 15.1 (*)    All other components within normal limits  COMPREHENSIVE METABOLIC PANEL - Abnormal; Notable for the following:    CO2 34 (*)    Glucose, Bld 127 (*)    Creatinine, Ser 1.37 (*)    Calcium 8.6 (*)    All other components within normal limits  TROPONIN I - Abnormal; Notable for the following:    Troponin I 0.05 (*)    All other components within normal limits  PROTIME-INR  BRAIN NATRIURETIC PEPTIDE   URINALYSIS COMPLETEWITH MICROSCOPIC ([facility] ONLY)   ____________________________________________  EKG  ED ECG REPORT I, Doran Stabler, the attending physician, personally viewed and interpreted this ECG.   Date: 08/14/2014  EKG Time: 448  Rate: 81  Rhythm: normal sinus rhythm  Axis: Normal axis  Intervals:Bifascicular block.  ST&T Change: No ST elevations or depressions. No abnormal T-wave inversions. LVH. No change from EKG of 05/11/2013.  ____________________________________________  RADIOLOGY chest x-ray stable with left pleural scarring. I personally viewed these images. ____________________________________________  PROCEDURES   ____________________________________________   INITIAL IMPRESSION / ASSESSMENT AND PLAN / ED COURSE  Pertinent labs & imaging results that were available during my care of the patient were reviewed by me and considered in my medical decision making (see chart for details).  ----------------------------------------- 7:21 AM on 08/14/2014 -----------------------------------------  Patient resting comfortably but still with decreased air movement and scant expiratory wheezes. We'll image the hospital for further COPD treatment. Signed out to Dr. Anselm Jungling. Still requiring 2 L nasal cannula for oxygen support. _________________ elevated troponin but appears chronic. ___________________________   FINAL CLINICAL IMPRESSION(S) / ED DIAGNOSES  Acute COPD exacerbation. Initial visit.    Orbie Pyo, MD 08/14/14 [phone]

## 2014-08-14 NOTE — Progress Notes (Signed)
Placed patient on auto bipap due to his c/o not able to tolerate air that comes from machine because such is too much. He prefers not to wear one. Explained to patient due to nature of high levels of co2 earlier, therapy is needed prn and night time. Patient was willing to wear auto unit. Tolerating therapy well at this time.

## 2014-08-14 NOTE — ED Notes (Addendum)
Patient with complaint of shortness of breath and chest tightness.

## 2014-08-14 NOTE — Progress Notes (Signed)
Followed up with Dr. Anselm Jungling on pt ABG results CO2 94, ph 7.19, 69 O2.Marland Kitchen Pt is currently on Bi-Pap as ordered, although was reported by respiratory therapy that pt tried to remove bipap from self. Continue to assess

## 2014-08-14 NOTE — H&P (Signed)
[facility] at [place] NAME: [name]    MR#:  [id number]  DATE OF BIRTH:  [birth year]  DATE OF ADMISSION:  08/14/2014  PRIMARY CARE PHYSICIAN: Dr. Megan Salon  Primary pulmonologist: Dr. [name]  REQUESTING/REFERRING PHYSICIAN: Dr. Clearnce Hasten  CHIEF COMPLAINT:   Chief Complaint  Patient presents with  . Chest Pain  . Shortness of Breath    HISTORY OF PRESENT ILLNESS: [name]  is a 45 y.o. male with a known history of COPD, hypertension, diabetes, hypercholesterol, coronary artery disease as complain of shortness of breath which is getting worse and constant for last 2 days gradually worsening so today morning decided to come to emergency room. He denies any associated cough or sputum production, fever, chest pain, chills. He did not seek any medical attention for this, was found hypoxic in the ER on arrival and initial treatment in ER did not help much so called in for admission.  The patient visited ER 3 days ago for his dental pain and was given ibuprofen, tramadol, erythromycin. Feels better now  For this problem.  PAST MEDICAL HISTORY:   Past Medical History  Diagnosis Date  . Hypertension   . Diabetes mellitus without complication   . COPD (chronic obstructive pulmonary disease)   . Hypercholesteremia unk  . Coronary artery disease   . Stroke   . Sleep apnea     does not use CPAP regularly.  . Sarcoidosis     PAST SURGICAL HISTORY:  Past Surgical History  Procedure Laterality Date  . Appendectomy      SOCIAL HISTORY:  History  Substance Use Topics  . Smoking status: Current Every Day Smoker -- 0.33 packs/day    Types: Cigarettes  . Smokeless tobacco: Not on file  . Alcohol Use: Yes     Comment: occassional    FAMILY HISTORY:  Family History  Problem Relation Age of Onset  . Hypertension Mother   . Heart disease Mother   . Diabetes Mother     DRUG ALLERGIES:  Allergies  Allergen Reactions  .  Penicillins Anaphylaxis    REVIEW OF SYSTEMS:   CONSTITUTIONAL: No fever, fatigue or weakness.  EYES: No blurred or double vision.  EARS, NOSE, AND THROAT: No tinnitus or ear pain.  RESPIRATORY: No cough, Positive for shortness of breath, minimal wheezing, no hemoptysis.  CARDIOVASCULAR: No chest pain, orthopnea, right leg edema present.  GASTROINTESTINAL: No nausea, vomiting, diarrhea or abdominal pain.  GENITOURINARY: No dysuria, hematuria.  ENDOCRINE: No polyuria, nocturia,  HEMATOLOGY: No anemia, easy bruising or bleeding SKIN: No rash or lesion. MUSCULOSKELETAL: No joint pain or arthritis.   NEUROLOGIC: No tingling, numbness, weakness.  PSYCHIATRY: No anxiety or depression.   MEDICATIONS AT HOME:  Prior to Admission medications   Medication Sig Start Date End Date Taking? Authorizing Provider  albuterol (PROVENTIL HFA;VENTOLIN HFA) 108 (90 BASE) MCG/ACT inhaler Inhale into the lungs every 6 (six) hours as needed for wheezing or shortness of breath.   Yes Historical Provider, MD  atorvastatin (LIPITOR) 40 MG tablet Take 40 mg by mouth daily.   Yes Historical Provider, MD  erythromycin base (E-MYCIN) 500 MG tablet Take 1 tablet (500 mg total) by mouth 4 (four) times daily. 08/11/14 08/21/14 Yes Sable Feil, PA-C  fluticasone-salmeterol (ADVAIR HFA) [phone] MCG/ACT inhaler Inhale 2 puffs into the lungs 2 (two) times daily.   Yes Historical Provider, MD  ibuprofen (ADVIL,MOTRIN) 800 MG tablet Take 1 tablet (800 mg total) by  mouth every 8 (eight) hours as needed for moderate pain. 08/11/14  Yes Sable Feil, PA-C  insulin detemir (LEVEMIR) 100 UNIT/ML injection Inject into the skin at bedtime.   Yes Historical Provider, MD  lisinopril (PRINIVIL,ZESTRIL) 10 MG tablet Take 10 mg by mouth daily.   Yes Historical Provider, MD  metoprolol (LOPRESSOR) 100 MG tablet Take 100 mg by mouth 2 (two) times daily.   Yes Historical Provider, MD  traMADol (ULTRAM) 50 MG tablet Take 1 tablet (50 mg total)  by mouth every 6 (six) hours as needed. 08/11/14 08/11/15 Yes Sable Feil, PA-C  fentaNYL (DURAGESIC - DOSED MCG/HR) 25 MCG/HR patch Apply one patch every 72 hours. Remove old patch. External Use only. Rotate sites. Check placement of patch every shift 04/14/13   Estill Dooms, MD      PHYSICAL EXAMINATION:   VITAL SIGNS: Blood pressure 136/67, pulse 79, temperature 98.3 F (36.8 C), temperature source Oral, resp. rate 19, height 6' (1.829 m), weight 127.007 kg (280 lb), SpO2 100 %.  GENERAL:  44 y.o.-year-old obase patient lying in the bed with no acute distress.  EYES: Pupils equal, round, reactive to light and accommodation. No scleral icterus. Extraocular muscles intact.  HEENT: Head atraumatic, normocephalic. Oropharynx and nasopharynx clear.  NECK:  Supple, no jugular venous distention. No thyroid enlargement, no tenderness.  LUNGS: Decreased breath sounds bilaterally, some wheezing, No crepitation. No use of accessory muscles of respiration.  CARDIOVASCULAR: S1, S2 normal. No murmurs, rubs, or gallops.  ABDOMEN: Soft, nontender, nondistended. Bowel sounds present. No organomegaly or mass.  EXTREMITIES:right leg pedal edema, No cyanosis, or clubbing.  NEUROLOGIC: Cranial nerves II through XII are intact. Muscle strength 5/5 in all extremities. Sensation intact. Gait not checked.  PSYCHIATRIC: The patient is alert and oriented x 3.  SKIN: No obvious rash, lesion, or ulcer.   LABORATORY PANEL:   CBC  Recent Labs Lab 08/14/14 0455  WBC 8.7  HGB 16.4  HCT 52.2*  PLT 227  MCV 85.2  MCH 26.8  MCHC 31.5*  RDW 15.1*   ------------------------------------------------------------------------------------------------------------------  Chemistries   Recent Labs Lab 08/14/14 0455  NA 142  K 4.3  CL 103  CO2 34*  GLUCOSE 127*  BUN 19  CREATININE 1.37*  CALCIUM 8.6*  AST 24  ALT 23  ALKPHOS 79  BILITOT 0.5    ------------------------------------------------------------------------------------------------------------------ estimated creatinine clearance is 94.8 mL/min (by C-G formula based on Cr of 1.37).   Coagulation profile  Recent Labs Lab 08/14/14 0455  INR 1.02    Cardiac Enzymes  Recent Labs Lab 08/14/14 0455  TROPONINI 0.05*    Urinalysis No results found for: COLORURINE, APPEARANCEUR, LABSPEC, PHURINE, GLUCOSEU, HGBUR, BILIRUBINUR, KETONESUR, PROTEINUR, UROBILINOGEN, NITRITE, LEUKOCYTESUR   RADIOLOGY: Dg Chest Portable 1 View  08/14/2014   CLINICAL DATA:  Chest pain  EXAM: PORTABLE CHEST - 1 VIEW  COMPARISON:  03/09/2013  FINDINGS: There is chronic linear scarring at the right base and in the left upper lobe. Chronic elevation and blunting of the lateral left costophrenic sulcus. No superimposed pneumonia or edema. No effusion or pneumothorax. Normal heart size and mediastinal contours.  IMPRESSION: Stable lung and left pleural scarring. No new finding to suggest acute disease.   Electronically Signed   By: Monte Fantasia M.D.   On: 08/14/2014 05:34    EKG:   Has LBBB, which is old per previous EKG.  IMPRESSION AND PLAN:  * Acute hypoxic respiratory failure secondary to COPD exacerbation IV steroid, inhaled steroid,  nebulizers, Spiriva, supplemental oxygen. Patient is already on erythromycin for his tooth infection, I will continue that.  * Sleep apnea Is advised to use CPAP, but he is not compliant with that. I spoke to him about following with Dr. [name] for this issue.  * Recent tooth infection He visited ER on July 6, was prescribed erythromycin, feels better, continue that.  * History of coronary artery disease  Being managed medically, continue aspirin , lisinopril, atorvastatin and metoprolol.  * Right leg swelling He denies any injury, pain, traveling history. Will get venous Doppler to rule out DVT.  * Hypertension Continue metoprolol and  lisinopril.  * Diabetes We'll manage with insulin sliding scale coverage.  * Smoking I counseled for 4 minutes to quit smoking, offered him to use nicotine supplements while he is in the hospital. He agrees with the idea, but does not require any supplement.  All the records are reviewed and case discussed with ED provider. Management plans discussed with the patient, family and they are in agreement.  CODE STATUS: full   TOTAL TIME TAKING CARE OF THIS PATIENT: 50 minutes.    Vaughan Basta M.D on 08/14/2014   Between 7am to 6pm - Pager - [phone]  After 6pm go to [email or web address] - password [email or web address] [facility]  Office  [phone]  CC: Primary care physician; Pcp Not In System

## 2014-08-15 ENCOUNTER — Inpatient Hospital Stay: Payer: Medicare Other

## 2014-08-15 ENCOUNTER — Inpatient Hospital Stay: Admit: 2014-08-15 | Payer: Medicare Other

## 2014-08-15 ENCOUNTER — Inpatient Hospital Stay
Admit: 2014-08-15 | Discharge: 2014-08-15 | Disposition: A | Discharge status: Home / Self Care | Payer: Medicare Other | Attending: Internal Medicine | Admitting: Internal Medicine

## 2014-08-15 LAB — BASIC METABOLIC PANEL
ANION GAP: 6 (ref 5–15)
BUN: 20 mg/dL (ref 6–20)
CALCIUM: 8.2 mg/dL — AB (ref 8.9–10.3)
CO2: 34 mmol/L — ABNORMAL HIGH (ref 22–32)
Chloride: 99 mmol/L — ABNORMAL LOW (ref 101–111)
Creatinine, Ser: 1.31 mg/dL — ABNORMAL HIGH (ref 0.61–1.24)
GFR calc Af Amer: >60 mL/min (ref >60)
Glucose, Bld: 228 mg/dL — ABNORMAL HIGH (ref 65–99)
POTASSIUM: 5.3 mmol/L — AB (ref 3.5–5.1)
SODIUM: 139 mmol/L (ref 135–145)

## 2014-08-15 LAB — BLOOD GAS, ARTERIAL
ACID-BASE EXCESS: 10.4 mmol/L — AB (ref 0.0–3.0)
Acid-Base Excess: 8.4 mmol/L — ABNORMAL HIGH (ref 0.0–3.0)
Allens test (pass/fail): POSITIVE — AB
Allens test (pass/fail): POSITIVE — AB
BICARBONATE: 41.7 meq/L — AB (ref 21.0–28.0)
Bicarbonate: 39.8 mEq/L — ABNORMAL HIGH (ref 21.0–28.0)
DELIVERY SYSTEMS: POSITIVE
Expiratory PAP: 5
FIO2: 0.36 %
FIO2: 0.28 %
INSPIRATORY PAP: 7
O2 SAT: 81.8 %
O2 Saturation: 81.9 %
PATIENT TEMPERATURE: 37
PCO2 ART: 95 mmHg — AB (ref 32.0–48.0)
PCO2 ART: 95 mmHg — AB (ref 32.0–48.0)
PH ART: 7.25 — AB (ref 7.350–7.450)
PO2 ART: 54 mmHg — AB (ref 83.0–108.0)
Patient temperature: 37
pH, Arterial: 7.23 — ABNORMAL LOW (ref 7.350–7.450)
pO2, Arterial: 55 mmHg — ABNORMAL LOW (ref 83.0–108.0)

## 2014-08-15 LAB — CBC
HEMATOCRIT: 51.9 % (ref 40.0–52.0)
Hemoglobin: 16.3 g/dL (ref 13.0–18.0)
MCH: 27.3 pg (ref 26.0–34.0)
MCHC: 31.3 g/dL — AB (ref 32.0–36.0)
MCV: 87 fL (ref 80.0–100.0)
Platelets: 240 10*3/uL (ref 150–440)
RBC: 5.97 MIL/uL — ABNORMAL HIGH (ref 4.40–5.90)
RDW: 15.5 % — AB (ref 11.5–14.5)
WBC: 12.3 10*3/uL — ABNORMAL HIGH (ref 3.8–10.6)

## 2014-08-15 LAB — GLUCOSE, CAPILLARY
Glucose-Capillary: 179 mg/dL — ABNORMAL HIGH (ref 65–99)
Glucose-Capillary: 237 mg/dL — ABNORMAL HIGH (ref 65–99)
Glucose-Capillary: 302 mg/dL — ABNORMAL HIGH (ref 65–99)
Glucose-Capillary: 202 mg/dL — ABNORMAL HIGH (ref 65–99)

## 2014-08-15 LAB — URINALYSIS COMPLETE WITH MICROSCOPIC (ARMC ONLY)
Bacteria, UA: NONE SEEN
Bilirubin Urine: NEGATIVE
Glucose, UA: 50 mg/dL — AB
Hgb urine dipstick: NEGATIVE
KETONES UR: NEGATIVE mg/dL
Leukocytes, UA: NEGATIVE
NITRITE: NEGATIVE
PH: 5 (ref 5.0–8.0)
PROTEIN: 100 mg/dL — AB
Specific Gravity, Urine: 1.027 (ref 1.005–1.030)

## 2014-08-15 MED ORDER — SODIUM POLYSTYRENE SULFONATE 15 GM/60ML PO SUSP
30.0000 g | Freq: Once | ORAL | Status: DC
  Filled 2014-08-15: qty 120

## 2014-08-15 MED ORDER — IOHEXOL 350 MG/ML SOLN
100.0000 mL | Freq: Once | INTRAVENOUS | Status: AC | PRN
  Administered 2014-08-15: 100 mL via INTRAVENOUS

## 2014-08-15 MED ORDER — IPRATROPIUM-ALBUTEROL 0.5-2.5 (3) MG/3ML IN SOLN
3.0000 mL | RESPIRATORY_TRACT | Status: DC
  Administered 2014-08-15 – 2014-08-16 (×7): 3 mL via RESPIRATORY_TRACT
  Filled 2014-08-15 (×7): qty 3

## 2014-08-15 NOTE — Progress Notes (Signed)
*  PRELIMINARY RESULTS* Echocardiogram 2D Echocardiogram has been performed.  [name] 08/15/2014, 9:37 AM

## 2014-08-15 NOTE — Progress Notes (Signed)
Placed patient on auto bipap therapy with 3liters o2 bleedin. Patient in no distress at this time. Tolerating therapy well

## 2014-08-15 NOTE — Progress Notes (Signed)
[facility] [place] NAME: [name]    MR#:  [id number]  DATE OF BIRTH:  01-Dec-1970  SUBJECTIVE:  CHIEF COMPLAINT:   Chief Complaint  Patient presents with  . Chest Pain  . Shortness of Breath    C/o headache- but his breathing- is little better than it was at home. He refused to keep using bipap this morning due to headache.  he does not want to go to CT chest angiogram in the morning- as he feels nauseated after that- and want to go for it later in afternoon after his lunch. He refused to drink kayexalate. Later after my explaination- he agreed to use bipap intermittently for 1-2 hours for 2-3 times in day, and to use cpap at night.  REVIEW OF SYSTEMS:  ROS  CONSTITUTIONAL: No fever, fatigue or weakness.  EYES: No blurred or double vision.  EARS, NOSE, AND THROAT: No tinnitus or ear pain.  RESPIRATORY: No cough, Positive for shortness of breath, minimal wheezing, no hemoptysis.  CARDIOVASCULAR: No chest pain, orthopnea, right leg edema present.  GASTROINTESTINAL: No nausea, vomiting, diarrhea or abdominal pain.  GENITOURINARY: No dysuria, hematuria.  ENDOCRINE: No polyuria, nocturia,  HEMATOLOGY: No anemia, easy bruising or bleeding SKIN: No rash or lesion. MUSCULOSKELETAL: No joint pain or arthritis.  NEUROLOGIC: No tingling, numbness, weakness.  PSYCHIATRY: No anxiety or depression  DRUG ALLERGIES:   Allergies  Allergen Reactions  . Penicillins Anaphylaxis    VITALS:  Blood pressure 113/56, pulse 80, temperature 98.3 F (36.8 C), temperature source Oral, resp. rate 18, height 6' (1.829 m), weight 127.007 kg (280 lb), SpO2 98 %.  PHYSICAL EXAMINATION:   GENERAL: 44 y.o.-year-old obase patient lying in the bed with no acute distress.  EYES: Pupils equal, round, reactive to light and accommodation. No scleral icterus. Extraocular muscles intact.  HEENT: Head atraumatic, normocephalic. Oropharynx  and nasopharynx clear.  NECK: Supple, no jugular venous distention. No thyroid enlargement, no tenderness.  LUNGS: Decreased breath sounds bilaterally, some wheezing, No crepitation. No use of accessory muscles of respiration.  CARDIOVASCULAR: S1, S2 normal. No murmurs, rubs, or gallops.  ABDOMEN: Soft, nontender, nondistended. Bowel sounds present. No organomegaly or mass.  EXTREMITIES:right leg pedal edema, No cyanosis, or clubbing.  NEUROLOGIC: Cranial nerves II through XII are intact. Muscle strength 5/5 in all extremities. Sensation intact. Gait not checked.  PSYCHIATRIC: The patient is alert and oriented x 3.  SKIN: No obvious rash, lesion, or ulcer.   Physical Exam LABORATORY PANEL:   CBC  Recent Labs Lab 08/15/14 0528  WBC 12.3*  HGB 16.3  HCT 51.9  PLT 240   ------------------------------------------------------------------------------------------------------------------  Chemistries   Recent Labs Lab 08/14/14 0455 08/15/14 0528  NA 142 139  K 4.3 5.3*  CL 103 99*  CO2 34* 34*  GLUCOSE 127* 228*  BUN 19 20  CREATININE 1.37* 1.31*  CALCIUM 8.6* 8.2*  AST 24  --   ALT 23  --   ALKPHOS 79  --   BILITOT 0.5  --    ------------------------------------------------------------------------------------------------------------------  Cardiac Enzymes  Recent Labs Lab 08/14/14 0455  TROPONINI 0.05*   ------------------------------------------------------------------------------------------------------------------  RADIOLOGY:  US Venous Img Lower Unilateral Right  08/14/2014   CLINICAL DATA:  Patient with right lower extremity swelling.  EXAM: RIGHT LOWER EXTREMITY VENOUS DOPPLER ULTRASOUND  TECHNIQUE: Gray-scale sonography with graded compression, as well as color Doppler and duplex ultrasound were performed to evaluate the lower extremity deep venous systems from the  level of the common femoral vein and including the common femoral, femoral, profunda  femoral, popliteal and calf veins including the posterior tibial, peroneal and gastrocnemius veins when visible. The superficial great saphenous vein was also interrogated. Spectral Doppler was utilized to evaluate flow at rest and with distal augmentation maneuvers in the common femoral, femoral and popliteal veins.  COMPARISON:  None.  FINDINGS: Contralateral Common Femoral Vein: Respiratory phasicity is normal and symmetric with the symptomatic side. No evidence of thrombus. Normal compressibility.  Common Femoral Vein: No evidence of thrombus. Normal compressibility, respiratory phasicity and response to augmentation.  Saphenofemoral Junction: No evidence of thrombus. Normal compressibility and flow on color Doppler imaging.  Profunda Femoral Vein: No evidence of thrombus. Normal compressibility and flow on color Doppler imaging.  Femoral Vein: No evidence of thrombus. Normal compressibility, respiratory phasicity and response to augmentation.  Popliteal Vein: No evidence of thrombus. Normal compressibility, respiratory phasicity and response to augmentation.  Calf Veins: No evidence of thrombus. Normal compressibility and flow on color Doppler imaging.  Superficial Great Saphenous Vein: No evidence of thrombus. Normal compressibility and flow on color Doppler imaging.  Venous Reflux:  None.  Other Findings:  None.  IMPRESSION: No evidence for DVT within the right lower extremity.   Electronically Signed   By: Lovey Newcomer M.D.   On: 08/14/2014 09:17   Dg Chest Portable 1 View  08/14/2014   CLINICAL DATA:  Chest pain  EXAM: PORTABLE CHEST - 1 VIEW  COMPARISON:  03/09/2013  FINDINGS: There is chronic linear scarring at the right base and in the left upper lobe. Chronic elevation and blunting of the lateral left costophrenic sulcus. No superimposed pneumonia or edema. No effusion or pneumothorax. Normal heart size and mediastinal contours.  IMPRESSION: Stable lung and left pleural scarring. No new finding to  suggest acute disease.   Electronically Signed   By: Monte Fantasia M.D.   On: 08/14/2014 05:34   ABG    Component Value Date/Time   PHART 7.23* 08/15/2014 0710   PCO2ART 95* 08/15/2014 0710   PO2ART 55* 08/15/2014 0710   HCO3 39.8* 08/15/2014 0710   O2SAT 81.8 08/15/2014 0710     ASSESSMENT AND PLAN:   * Acute hypoxic and hypercapneic respiratory failure secondary to COPD exacerbation IV steroid, inhaled steroid, nebulizers, Spiriva, supplemental oxygen. Patient is already on erythromycin for his tooth infection, I will continue that. He was drowsy yesterday evening- so done ABG- it showed Acidosis and hypercapnea    Used Bipap and cpap at night- slightly better ABG this morning, but more hypoxia.   He refused to use bipap now- due to headache- but agreed to use intermittently.   He is completely alert and oriented now- and I explained him the importance of this.   He will use it later in day.    I called Pulmonary consult.   Due to profound hypoxia- will also get CT angiogram chest to r/o PE.    * Sleep apnea Is advised to use CPAP, but he is not compliant with that. I spoke to him about following with Dr. [name] for this issue.  * CKD- with now Hyperkalemia   Ordered kayxalate.    Pt refused for that.   I explained the risk of cardiac arrhythmia and arrest- he understand- but will like not to take it.   Will follow BMP tomorrow, monitor on tele.  * Recent tooth infection He visited ER on July 6, was prescribed erythromycin, feels better, continue that.  *  History of coronary artery disease Being managed medically, continue aspirin , lisinopril, atorvastatin and metoprolol.  * Right leg swelling He denies any injury, pain, traveling history. Will get venous Doppler to rule out DVT.  * Hypertension Continue metoprolol and lisinopril.  * Diabetes We'll manage with insulin sliding scale coverage.  * smoking   Councelled on admission to quit.  All the records  are reviewed and case discussed with Care Management/Social Workerr. Management plans discussed with the patient, family and they are in agreement.  CODE STATUS: full.  TOTAL TIME TAKING CARE OF THIS PATIENT: 45 minutes- critical care..   More than 50% of the visit was spent in counseling/coordination of care  POSSIBLE D/C IN 2-3  DAYS, DEPENDING ON CLINICAL CONDITION.   Vaughan Basta M.D on 08/15/2014   Between 7am to 6pm - Pager - [phone]  After 6pm go to [email or web address] - password [email or web address] [facility]  Office  [phone]  CC: Primary care physician; Pcp Not In System

## 2014-08-15 NOTE — Progress Notes (Signed)
Patient has done well with auto bipap. Sleeping soundly at this time. svn given in line with bipap therapy.

## 2014-08-16 LAB — BASIC METABOLIC PANEL
Anion gap: 6 (ref 5–15)
BUN: 23 mg/dL — AB (ref 6–20)
CO2: 36 mmol/L — ABNORMAL HIGH (ref 22–32)
Calcium: 8.6 mg/dL — ABNORMAL LOW (ref 8.9–10.3)
Chloride: 99 mmol/L — ABNORMAL LOW (ref 101–111)
Creatinine, Ser: 1.4 mg/dL — ABNORMAL HIGH (ref 0.61–1.24)
GFR calc Af Amer: >60 mL/min (ref >60)
GFR calc non Af Amer: 60 mL/min — ABNORMAL LOW (ref >60)
Glucose, Bld: 178 mg/dL — ABNORMAL HIGH (ref 65–99)
Potassium: 5.3 mmol/L — ABNORMAL HIGH (ref 3.5–5.1)
Sodium: 141 mmol/L (ref 135–145)

## 2014-08-16 LAB — GLUCOSE, CAPILLARY
GLUCOSE-CAPILLARY: 223 mg/dL — AB (ref 65–99)
GLUCOSE-CAPILLARY: 268 mg/dL — AB (ref 65–99)
GLUCOSE-CAPILLARY: 284 mg/dL — AB (ref 65–99)
Glucose-Capillary: 327 mg/dL — ABNORMAL HIGH (ref 65–99)

## 2014-08-16 MED ORDER — INSULIN DETEMIR 100 UNIT/ML ~~LOC~~ SOLN
15.0000 [IU] | Freq: Every day | SUBCUTANEOUS | Status: DC
  Administered 2014-08-16 – 2014-08-17 (×2): 15 [IU] via SUBCUTANEOUS
  Filled 2014-08-16 (×3): qty 0.15

## 2014-08-16 MED ORDER — BUDESONIDE 0.5 MG/2ML IN SUSP
0.5000 mg | Freq: Two times a day (BID) | RESPIRATORY_TRACT | Status: DC
  Administered 2014-08-16 – 2014-08-18 (×4): 0.5 mg via RESPIRATORY_TRACT
  Filled 2014-08-16 (×4): qty 2

## 2014-08-16 MED ORDER — ENOXAPARIN SODIUM 40 MG/0.4ML ~~LOC~~ SOLN
40.0000 mg | SUBCUTANEOUS | Status: DC
  Administered 2014-08-17: 40 mg via SUBCUTANEOUS
  Filled 2014-08-16: qty 0.4

## 2014-08-16 MED ORDER — ALBUTEROL SULFATE (2.5 MG/3ML) 0.083% IN NEBU
2.5000 mg | INHALATION_SOLUTION | RESPIRATORY_TRACT | Status: DC
  Administered 2014-08-16 – 2014-08-18 (×12): 2.5 mg via RESPIRATORY_TRACT
  Filled 2014-08-16 (×12): qty 3

## 2014-08-16 NOTE — Progress Notes (Signed)
Inpatient Diabetes Program Recommendations  AACE/ADA: New Consensus Statement on Inpatient Glycemic Control (2013)  Target Ranges:  Prepandial:   less than 140 mg/dL      Peak postprandial:   less than 180 mg/dL (1-2 hours)      Critically ill patients:  140 - 180 mg/dL   Results for ALDO, SONDGEROTH (MRN [id number]) as of 08/16/2014 10:51  Ref. Range 08/15/2014 10:02 08/15/2014 12:10 08/15/2014 16:42 08/15/2014 21:39 08/16/2014 07:31  Glucose-Capillary Latest Ref Range: 65-99 mg/dL 179 (H) 237 (H) 302 (H) 202 (H) 223 (H)    Diabetes history: DM2 Outpatient Diabetes medications: Levemir 40 units QHS, Humalog 15-25 units TID with meals, Metformin 500 mg BID Current orders for Inpatient glycemic control: Novolog 0-9 units TID with meals  Inpatient Diabetes Program Recommendations Insulin - Basal: Please consider ordering Levemir 10 units Q24H (starting now). Insulin - Meal Coverage: While inpatient and ordered steroids, please consider ordering Novolog 3 units TID with meals for meal coverage (in addition to Novolog correction scale).  Thanks, Barnie Alderman, RN, MSN, CCRN, CDE Diabetes Coordinator Inpatient Diabetes Program [phone] (Team Pager from [place] to [place]) [phone] ([place] office) [phone] [facility] office) [phone] [facility] office)

## 2014-08-16 NOTE — Care Management (Signed)
Important Message  Patient Details  Name: [name] MRN: [id number] Date of Birth: [birth year]   Medicare Important Message Given:  Yes-second notification given    Juliann Pulse A Allmond 08/16/2014, 10:18 AM

## 2014-08-16 NOTE — Progress Notes (Signed)
Date: 08/16/2014,   MRN# [id number] [name] [birth year] Code Status:     Code Status Orders        Start     Ordered   08/14/14 1032  Full code   Continuous     08/14/14 1031     Hosp day:@LENGTHOFSTAYDAYS @ Referring MD: @ATDPROV @     PCP:      AdmissionWeight: 280 lb (127.007 kg)                 CurrentWeight: 280 lb (127.007 kg)    CC: HYPOXIA, HYPERCAPNEA  HPI: This is 44 yr old male with multiple medical problems including obstructive sleep apnea, copd, sarcoidosis, obesity came in with increase sob, without fever, cough, hemoptysis, ectopy or syncope. On work up he was noted to have hypoxia or hypercapnea. He has been non compliant with his bipap. Presently mentating well, discussed fully that he need to wear his bipap to avoid co2 narcosis, mental status changes etc.     PMHX:   Past Medical History  Diagnosis Date  . Hypertension   . Diabetes mellitus without complication   . COPD (chronic obstructive pulmonary disease)   . Hypercholesteremia unk  . Coronary artery disease   . Stroke   . Sleep apnea     does not use CPAP regularly.  . Sarcoidosis    Surgical Hx:  Past Surgical History  Procedure Laterality Date  . Appendectomy     Family Hx:  Family History  Problem Relation Age of Onset  . Hypertension Mother   . Heart disease Mother   . Diabetes Mother    Social Hx:   History  Substance Use Topics  . Smoking status: Current Every Day Smoker -- 0.33 packs/day    Types: Cigarettes  . Smokeless tobacco: Not on file  . Alcohol Use: Yes     Comment: occassional   Medication:    Home Medication:  No current outpatient prescriptions on file.  Current Medication: @CURMEDTAB @   Allergies:  Penicillins  Review of Systems: Gen:  Denies  fever, sweats, chills HEENT: Denies blurred vision, double vision, ear pain, eye pain, hearing loss, nose bleeds, sore throat Cvc:  No dizziness, chest pain or heaviness Resp:  Less sob, no wheezing,  pleurisy or hemoptysis  Gi: Denies swallowing difficulty, stomach pain, nausea or vomiting, diarrhea, constipation, bowel incontinence Gu:  Denies bladder incontinence, burning urine Ext:   No Joint pain, stiffness or swelling Skin: No skin rash, easy bruising or bleeding or hives Endoc:  No polyuria, polydipsia , polyphagia or weight change Psych: No depression, insomnia or hallucinations  Other:  All other systems negative  Physical Examination:   VS: BP 117/64 mmHg  Pulse 69  Temp(Src) 98 F (36.7 C) (Oral)  Resp 20  Ht 6' (1.829 m)  Wt 280 lb (127.007 kg)  BMI 37.97 kg/m2  SpO2 99%  General Appearance: No distress, large male, in bed  Neuro/psych without focal findings, mental status, speech normal, alert and oriented, cranial nerves 2-12 intact, reflexes normal and symmetric, sensation grossly normal  HEENT: PERRLA, EOM intact, no ptosis, no other lesions noticed Neck: Supple, short, thick, no stridor, JVD. Thick neck Pulmonary:.No wheezing, No rales     Cardiovascular:  Normal S1,S2.  No m/r/g.      Abdomen:Benign, Soft, non-tender, No masses, hepatosplenomegaly, No lymphadenopathy Endoc: No evident thyromegaly, no signs of acromegaly or Cushing features Skin:   warm, no rashes, no ecchymosis  Extremities: normal, no cyanosis, clubbing,  no edema, warm with normal capillary refill. Other findings:   Labs results:   Recent Labs     08/14/14  0455  08/15/14  0528  08/16/14  0606  HGB  16.4  16.3   --   HCT  52.2*  51.9   --   MCV  85.2  87.0   --   WBC  8.7  12.3*   --   BUN  19  20  23*  CREATININE  1.37*  1.31*  1.40*  GLUCOSE  127*  228*  178*  CALCIUM  8.6*  8.2*  8.6*  INR  1.02   --    --   ,  FIO2 % 0.28 0.36 0.32     Delivery systems  NASAL CANNULA BILEVEL POSITIVE AIRWAY PR... NASAL CANNULA    pH, Arterial 7.350 - 7.450  7.25 (L) 7.23 (L) 7.19 (LL)CM    pCO2 arterial 32.0 - 48.0 mmHg 95 (HH) 95 (HH)CM 94 (HH)CM   Comments: CRITICAL RESULT CALLED  TO, READ BACK BY AND VERIFIED WITH:  DR.S.KHAN AT 1259 ON [id number]      pO2, Arterial 83.0 - 108.0 mmHg 54 (L) 55 (L) 69 (L)    Bicarbonate 21.0 - 28.0 mEq/L 41.7 (H) 39.8 (H) 35.9 (H)    Acid-Base Excess 0.0 - 3.0 mmol/L 10.4 (H) 8.4 (H) 4.4 (H)    O2 Saturation % 81.9 81.8 88.7    Patient temperature  37.0 37.0 37.0    Collection site  RIGHT RADIAL RIGHT BRACHIAL RIGHT RADIAL    Sample type  ARTERIAL DRAW ARTERIAL DRAW ARTERIAL DRAW    Allens test (pass/fail) PASS  POSITIVE (A) POSITIVE (A) POSITIVE (A)   Resulting Agency  [facility]      Specimen Collected: 08/15/14 12:50 PM Last Resulted: 08/15/14 1:00 PM             CM=Additional comments            Ct Angio Chest Pe W/cm &/or Wo Cm  08/15/2014   CLINICAL DATA:  Increasing shortness of breath for 2 days. History of sarcoid, coronary artery disease and hypertension.  EXAM: CT ANGIOGRAPHY CHEST WITH CONTRAST  TECHNIQUE: Multidetector CT imaging of the chest was performed using the standard protocol during bolus administration of intravenous contrast. Multiplanar CT image reconstructions and MIPs were obtained to evaluate the vascular anatomy.  CONTRAST:  170mL OMNIPAQUE IOHEXOL 350 MG/ML SOLN  COMPARISON:  03/03/2013 and prior CTs.  08/14/2014 radiograph  FINDINGS: This is a technically satisfactory study.  Mediastinum/Nodes: No pulmonary emboli are identified. Cardiomegaly is identified. There is no evidence of thoracic aortic aneurysm or dissection. No pericardial effusion or enlarged lymph nodes are present.  Lungs/Pleura: Scattered areas of scarring, atelectasis and mild bronchiectasis again noted. Interstitial prominence is again identified. There is no evidence of airspace disease, consolidation, suspicious nodule, mass or endobronchial/ endotracheal lesion. There is no evidence of pleural effusion or pneumothorax.  Upper abdomen: Unremarkable  Musculoskeletal: No acute or suspicious abnormalities.   Review of the MIP images confirms the above findings.  IMPRESSION: No evidence of acute abnormality.  No evidence of pulmonary emboli.  Unchanged areas of scarring/atelectasis, bronchiectasis and interstitial prominence compatible with this patient's history sarcoid.  Cardiomegaly   Electronically Signed   By: Margarette Canada M.D.   On: 08/15/2014 13:56      EKG:     Other:   Assessment and Plan: - Hx of sarcoidosis Continue to observe  - Hx  of Obstructive sleep apnea, on bipap, elevated pco2 (95 mmHg) Encourage he need to sleep on bipap Weight loss Avoid sedation Check tsh, phos  - Ex smoker, suspect copd Albuterol/spiriva Out patient follow up visit     I have personally obtained a history, examined the patient, evaluated laboratory and imaging results, formulated the assessment and plan and placed orders.  The Patient requires high complexity decision making for assessment and support, frequent evaluation and titration of therapies, application of advanced monitoring technologies and extensive interpretation of multiple databases.   Herbon Fleming,M.D. Pulmonary & Critical care Medicine [facility]

## 2014-08-16 NOTE — Progress Notes (Signed)
Pt states he is not in pain and does not need fentanyl patch. Pt discarded patch himself. States he wants clonidine patch, will notiy MD while rounding

## 2014-08-16 NOTE — Progress Notes (Signed)
[place] [facility] [place] NAME: [name]    MR#:  [id number]  DATE OF BIRTH:  1970/04/21  SUBJECTIVE:  CHIEF COMPLAINT:   Chief Complaint  Patient presents with  . Chest Pain  . Shortness of Breath    Used bipap at night, more alert, and feels little better today.   REVIEW OF SYSTEMS:  ROS  CONSTITUTIONAL: No fever, fatigue or weakness.  EYES: No blurred or double vision.  EARS, NOSE, AND THROAT: No tinnitus or ear pain.  RESPIRATORY: No cough, Positive for shortness of breath, minimal wheezing, no hemoptysis.  CARDIOVASCULAR: No chest pain, orthopnea, right leg edema present.  GASTROINTESTINAL: No nausea, vomiting, diarrhea or abdominal pain.  GENITOURINARY: No dysuria, hematuria.  ENDOCRINE: No polyuria, nocturia,  HEMATOLOGY: No anemia, easy bruising or bleeding SKIN: No rash or lesion. MUSCULOSKELETAL: No joint pain or arthritis.  NEUROLOGIC: No tingling, numbness, weakness.  PSYCHIATRY: No anxiety or depression  DRUG ALLERGIES:   Allergies  Allergen Reactions  . Penicillins Anaphylaxis    VITALS:  Blood pressure 117/64, pulse 69, temperature 98 F (36.7 C), temperature source Oral, resp. rate 20, height 6' (1.829 m), weight 127.007 kg (280 lb), SpO2 99 %.  PHYSICAL EXAMINATION:   GENERAL: 44 y.o.-year-old obase patient lying in the bed with no acute distress.  EYES: Pupils equal, round, reactive to light and accommodation. No scleral icterus. Extraocular muscles intact.  HEENT: Head atraumatic, normocephalic. Oropharynx and nasopharynx clear.  NECK: Supple, no jugular venous distention. No thyroid enlargement, no tenderness.  LUNGS: Decreased breath sounds bilaterally, some wheezing, No crepitation. No use of accessory muscles of respiration.  CARDIOVASCULAR: S1, S2 normal. No murmurs, rubs, or gallops.  ABDOMEN: Soft, nontender, nondistended. Bowel sounds present. No organomegaly or mass.   EXTREMITIES:right leg pedal edema, No cyanosis, or clubbing.  NEUROLOGIC: Cranial nerves II through XII are intact. Muscle strength 5/5 in all extremities. Sensation intact. Gait not checked.  PSYCHIATRIC: The patient is alert and oriented x 3.  SKIN: No obvious rash, lesion, or ulcer.   Physical Exam LABORATORY PANEL:   CBC  Recent Labs Lab 08/15/14 0528  WBC 12.3*  HGB 16.3  HCT 51.9  PLT 240   ------------------------------------------------------------------------------------------------------------------  Chemistries   Recent Labs Lab 08/14/14 0455  08/16/14 0606  NA 142  < > 141  K 4.3  < > 5.3*  CL 103  < > 99*  CO2 34*  < > 36*  GLUCOSE 127*  < > 178*  BUN 19  < > 23*  CREATININE 1.37*  < > 1.40*  CALCIUM 8.6*  < > 8.6*  AST 24  --   --   ALT 23  --   --   ALKPHOS 79  --   --   BILITOT 0.5  --   --   < > = values in this interval not displayed. ------------------------------------------------------------------------------------------------------------------  Cardiac Enzymes  Recent Labs Lab 08/14/14 0455  TROPONINI 0.05*   ------------------------------------------------------------------------------------------------------------------  RADIOLOGY:  Ct Angio Chest Pe W/cm &/or Wo Cm  08/15/2014   CLINICAL DATA:  Increasing shortness of breath for 2 days. History of sarcoid, coronary artery disease and hypertension.  EXAM: CT ANGIOGRAPHY CHEST WITH CONTRAST  TECHNIQUE: Multidetector CT imaging of the chest was performed using the standard protocol during bolus administration of intravenous contrast. Multiplanar CT image reconstructions and MIPs were obtained to evaluate the vascular anatomy.  CONTRAST:  167mL OMNIPAQUE IOHEXOL 350 MG/ML SOLN  COMPARISON:  03/03/2013 and prior CTs.  08/14/2014 radiograph  FINDINGS: This is a technically satisfactory study.  Mediastinum/Nodes: No pulmonary emboli are identified. Cardiomegaly is identified. There is no  evidence of thoracic aortic aneurysm or dissection. No pericardial effusion or enlarged lymph nodes are present.  Lungs/Pleura: Scattered areas of scarring, atelectasis and mild bronchiectasis again noted. Interstitial prominence is again identified. There is no evidence of airspace disease, consolidation, suspicious nodule, mass or endobronchial/ endotracheal lesion. There is no evidence of pleural effusion or pneumothorax.  Upper abdomen: Unremarkable  Musculoskeletal: No acute or suspicious abnormalities.  Review of the MIP images confirms the above findings.  IMPRESSION: No evidence of acute abnormality.  No evidence of pulmonary emboli.  Unchanged areas of scarring/atelectasis, bronchiectasis and interstitial prominence compatible with this patient's history sarcoid.  Cardiomegaly   Electronically Signed   By: Margarette Canada M.D.   On: 08/15/2014 13:56   ABG    Component Value Date/Time   PHART 7.25* 08/15/2014 1250   PCO2ART 95* 08/15/2014 1250   PO2ART 54* 08/15/2014 1250   HCO3 41.7* 08/15/2014 1250   O2SAT 81.9 08/15/2014 1250     ASSESSMENT AND PLAN:   * Acute hypoxic and hypercapneic respiratory failure secondary to COPD exacerbation IV steroid, inhaled steroid, nebulizers, Spiriva, supplemental oxygen. Patient is already on erythromycin for his tooth infection, I will continue that. He was drowsy - checked ABG- it showed Acidosis and hypercapnea    Used Bipap and cpap at night- slightly better ABG   He is very non compliant to CPAP.   I spoke to Dr. [name]- His primary pulmonologist to address this issue.  CT chest angiogram is negative for PE or any infiltrates.      * Sleep apnea Is advised to use CPAP, but he is not compliant with that. I spoke to him about following with Dr. [name] for this issue.  * CKD- with now Hyperkalemia   Ordered kayxalate.    Pt refused for that.   I explained the risk of cardiac arrhythmia and arrest- he understand- but will like not to take  it.   Will follow BMP tomorrow, monitor on tele.   Stop ACE inhibitors.  * Recent tooth infection  He visited ER on July 6, was prescribed erythromycin, feels better, continue that.  * History of coronary artery disease Being managed medically, continue aspirin , lisinopril, atorvastatin and metoprolol.  * Right leg swelling He denies any injury, pain, traveling history. Will get venous Doppler to rule out DVT.  * Hypertension  Continue metoprolol , stop lisinopril.  * Diabetes  We'll manage with insulin sliding scale coverage.  * smoking   Councelled on admission to quit.  All the records are reviewed and case discussed with Care Management/Social Worker. Management plans discussed with the patient, family and they are in agreement.  CODE STATUS: full.  TOTAL TIME TAKING CARE OF THIS PATIENT: 35 minutes.  More than 50% of the visit was spent in counseling/coordination of care  POSSIBLE D/C IN 2-3  DAYS, DEPENDING ON CLINICAL CONDITION.   Vaughan Basta M.D on 08/16/2014   Between 7am to 6pm - Pager - [phone]  After 6pm go to [email or web address] - password [email or web address] [facility]  Office  [phone]  CC: Primary care physician; Pcp Not In System

## 2014-08-17 LAB — BASIC METABOLIC PANEL
ANION GAP: 8 (ref 5–15)
BUN: 27 mg/dL — ABNORMAL HIGH (ref 6–20)
CALCIUM: 8.7 mg/dL — AB (ref 8.9–10.3)
CO2: 33 mmol/L — ABNORMAL HIGH (ref 22–32)
CREATININE: 1.34 mg/dL — AB (ref 0.61–1.24)
Chloride: 97 mmol/L — ABNORMAL LOW (ref 101–111)
GFR calc Af Amer: >60 mL/min (ref >60)
GFR calc non Af Amer: >60 mL/min (ref >60)
Glucose, Bld: 303 mg/dL — ABNORMAL HIGH (ref 65–99)
Potassium: 4.7 mmol/L (ref 3.5–5.1)
Sodium: 138 mmol/L (ref 135–145)

## 2014-08-17 LAB — GLUCOSE, CAPILLARY
GLUCOSE-CAPILLARY: 330 mg/dL — AB (ref 65–99)
Glucose-Capillary: 355 mg/dL — ABNORMAL HIGH (ref 65–99)
Glucose-Capillary: 248 mg/dL — ABNORMAL HIGH (ref 65–99)
Glucose-Capillary: 290 mg/dL — ABNORMAL HIGH (ref 65–99)

## 2014-08-17 LAB — TSH: TSH: 2.2 u[IU]/mL (ref 0.350–4.500)

## 2014-08-17 MED ORDER — TRAMADOL HCL 50 MG PO TABS: 50.0000 mg | ORAL_TABLET | Freq: Four times a day (QID) | ORAL | Status: DC | PRN

## 2014-08-17 NOTE — Progress Notes (Signed)
Date: 08/17/2014,   MRN# [id number] [name] 05-Sep-1970 Code Status:     Code Status Orders        Start     Ordered   08/14/14 1032  Full code   Continuous     08/14/14 1031     Hosp day:@LENGTHOFSTAYDAYS @ Referring MD: @ATDPROV @      HPI: Alert, desat with activity, tolerating bipap at night PMHX:   Past Medical History  Diagnosis Date  . Hypertension   . Diabetes mellitus without complication   . COPD (chronic obstructive pulmonary disease)   . Hypercholesteremia unk  . Coronary artery disease   . Stroke   . Sleep apnea     does not use CPAP regularly.  . Sarcoidosis    Surgical Hx:  Past Surgical History  Procedure Laterality Date  . Appendectomy     Family Hx:  Family History  Problem Relation Age of Onset  . Hypertension Mother   . Heart disease Mother   . Diabetes Mother    Social Hx:   History  Substance Use Topics  . Smoking status: Current Every Day Smoker -- 0.33 packs/day    Types: Cigarettes  . Smokeless tobacco: Not on file  . Alcohol Use: Yes     Comment: occassional   Medication:    Home Medication:  No current outpatient prescriptions on file.  Current Medication: @CURMEDTAB @   Allergies:  Penicillins  Review of Systems: Gen:  Denies  fever, sweats, chills HEENT: Denies blurred vision, double vision, ear pain, eye pain, hearing loss, nose bleeds, sore throat Cvc:  No dizziness, chest pain or heaviness Resp:  Less sob, no hemoptysis, pleurisy or wheezing   Gi: Denies swallowing difficulty, stomach pain, nausea or vomiting, diarrhea, constipation, bowel incontinence Gu:  Denies bladder incontinence, burning urine Ext:   No Joint pain, stiffness or swelling Skin: No skin rash, easy bruising or bleeding or hives Endoc:  No polyuria, polydipsia , polyphagia or weight change Psych: No depression, insomnia or hallucinations  Other:  All other systems negative  Physical Examination:   VS: BP 137/82 mmHg  Pulse 76  Temp(Src)  98.2 F (36.8 C) (Oral)  Resp 16  Ht 6' (1.829 m)  Wt 280 lb (127.007 kg)  BMI 37.97 kg/m2  SpO2 93%  General Appearance: No distress, large male, no distress, resp tx in the room  Neuro: without focal findings, mental status, speech normal, alert and oriented, cranial nerves 2-12 intact, reflexes normal and symmetric, sensation grossly normal  HEENT: PERRLA, EOM intact, no ptosis, no other lesions noticed Neck, Supple, thick, no stridor Pulmonary:.No wheezing, No rales  Sputum Production:   Cardiovascular:  Normal S1,S2.  No m/r/g.  Abdominal aorta pulsation normal.    Abdomen:Benign, Soft, non-tender, No masses, hepatosplenomegaly, No lymphadenopathy Endoc: No evident thyromegaly, no signs of acromegaly or Cushing features Skin:   warm, no rashes, no ecchymosis  Extremities: normal, no cyanosis, clubbing, no edema, warm with normal capillary refill. Other findings:   Labs results:   Recent Labs     08/15/14  0528  08/16/14  0606  08/17/14  0541  HGB  16.3   --    --   HCT  51.9   --    --   MCV  87.0   --    --   WBC  12.3*   --    --   BUN  20  23*  27*  CREATININE  1.31*  1.40*  1.34*  GLUCOSE  228*  178*  303*  CALCIUM  8.2*  8.6*  8.7*  ,  Assessment and Plan: - Hx of sarcoidosis, scattered scarring Continue to observe  - Hx of Obstructive sleep apnea, on bipap, elevated pco2 (95 mmHg) tsh normal Encourage he need to sleep on bipap Weight loss Avoid sedation  - Ex smoker, suspect copd, desat with activity Albuterol/spiriva May need portable oxygen concentrator at 2 liters, recheck   - Mild bronchiectasis, not a major concern  Pulmonary toilet as needed Following    I have personally obtained a history, examined the patient, evaluated laboratory and imaging results, formulated the assessment and plan and placed orders.  The Patient requires high complexity decision making for assessment and support, frequent evaluation and titration of therapies,  application of advanced monitoring technologies and extensive interpretation of multiple databases.   [name],M.D. Pulmonary & Critical care Medicine [facility]

## 2014-08-17 NOTE — Progress Notes (Signed)
Inpatient Diabetes Program Recommendations  AACE/ADA: New Consensus Statement on Inpatient Glycemic Control (2013)  Target Ranges:  Prepandial:   less than 140 mg/dL      Peak postprandial:   less than 180 mg/dL (1-2 hours)      Critically ill patients:  140 - 180 mg/dL   Results for [name], [name] (MRN [id number]) as of 08/17/2014 12:13  Ref. Range 08/16/2014 07:31 08/16/2014 11:34 08/16/2014 16:19 08/16/2014 21:14 08/17/2014 07:22  Glucose-Capillary Latest Ref Range: 65-99 mg/dL 223 (H) 268 (H) 284 (H) 327 (H) 355 (H)   Reason for Visit: elevated CBG  Diabetes history: Type 2 Outpatient Diabetes medications: Levemir 40 units qhs, Humalog 15-25 units tid with meals, Metfromin 500mg  bid Current orders for Inpatient glycemic control: Levemir 15 units qday, Novolog correction 0-9 units tid   Please consider ordering Levemir 30 units Q24H .   While inpatient and ordered steroids, please consider ordering Novolog 10 units TID with meals for meal coverage (in addition to Novolog correction scale).  Please order Novolog hs correction 0-5 units.   Gentry Fitz, RN, BA, MHA, CDE Diabetes Coordinator Inpatient Diabetes Program  [phone] (Team Pager) [phone] ([place]) 08/17/2014 12:16 PM

## 2014-08-17 NOTE — Progress Notes (Signed)
[name] at [place] NAME: [name]    MR#:  [id number]  DATE OF BIRTH:  12-Jun-1970  SUBJECTIVE:  CHIEF COMPLAINT:   Chief Complaint  Patient presents with  . Chest Pain  . Shortness of Breath    Used bipap at night, more alert, and feels little better today. His saturation dropping up to 77 on walking on room air.   REVIEW OF SYSTEMS:  ROS  CONSTITUTIONAL: No fever, fatigue or weakness.  EYES: No blurred or double vision.  EARS, NOSE, AND THROAT: No tinnitus or ear pain.  RESPIRATORY: No cough, Positive for shortness of breath, minimal wheezing, no hemoptysis.  CARDIOVASCULAR: No chest pain, orthopnea, right leg edema present.  GASTROINTESTINAL: No nausea, vomiting, diarrhea or abdominal pain.  GENITOURINARY: No dysuria, hematuria.  ENDOCRINE: No polyuria, nocturia,  HEMATOLOGY: No anemia, easy bruising or bleeding SKIN: No rash or lesion. MUSCULOSKELETAL: No joint pain or arthritis.  NEUROLOGIC: No tingling, numbness, weakness.  PSYCHIATRY: No anxiety or depression  DRUG ALLERGIES:   Allergies  Allergen Reactions  . Penicillins Anaphylaxis    VITALS:  Blood pressure 117/65, pulse 74, temperature 98.2 F (36.8 C), temperature source Oral, resp. rate 20, height 6' (1.829 m), weight 127.007 kg (280 lb), SpO2 96 %.  PHYSICAL EXAMINATION:   GENERAL: 44 y.o.-year-old obase patient lying in the bed with no acute distress.  EYES: Pupils equal, round, reactive to light and accommodation. No scleral icterus. Extraocular muscles intact.  HEENT: Head atraumatic, normocephalic. Oropharynx and nasopharynx clear.  NECK: Supple, no jugular venous distention. No thyroid enlargement, no tenderness.  LUNGS: Decreased breath sounds bilaterally, some wheezing, No crepitation. No use of accessory muscles of respiration.  CARDIOVASCULAR: S1, S2 normal. No murmurs, rubs, or gallops.  ABDOMEN: Soft, nontender,  nondistended. Bowel sounds present. No organomegaly or mass.  EXTREMITIES:right leg pedal edema, No cyanosis, or clubbing.  NEUROLOGIC: Cranial nerves II through XII are intact. Muscle strength 5/5 in all extremities. Sensation intact. Gait not checked.  PSYCHIATRIC: The patient is alert and oriented x 3.  SKIN: No obvious rash, lesion, or ulcer.   Physical Exam LABORATORY PANEL:   CBC  Recent Labs Lab 08/15/14 0528  WBC 12.3*  HGB 16.3  HCT 51.9  PLT 240   ------------------------------------------------------------------------------------------------------------------  Chemistries   Recent Labs Lab 08/14/14 0455  08/17/14 0541  NA 142  < > 138  K 4.3  < > 4.7  CL 103  < > 97*  CO2 34*  < > 33*  GLUCOSE 127*  < > 303*  BUN 19  < > 27*  CREATININE 1.37*  < > 1.34*  CALCIUM 8.6*  < > 8.7*  AST 24  --   --   ALT 23  --   --   ALKPHOS 79  --   --   BILITOT 0.5  --   --   < > = values in this interval not displayed. ------------------------------------------------------------------------------------------------------------------  Cardiac Enzymes  Recent Labs Lab 08/14/14 0455  TROPONINI 0.05*   ------------------------------------------------------------------------------------------------------------------  RADIOLOGY:  No results found. ABG    Component Value Date/Time   PHART 7.25* 08/15/2014 1250   PCO2ART 95* 08/15/2014 1250   PO2ART 54* 08/15/2014 1250   HCO3 41.7* 08/15/2014 1250   O2SAT 81.9 08/15/2014 1250     ASSESSMENT AND PLAN:   * Acute hypoxic and hypercapneic respiratory failure secondary to COPD exacerbation IV steroid, inhaled steroid, nebulizers, Spiriva, supplemental oxygen. Patient is already  on erythromycin for his tooth infection, I will continue that. He was drowsy - checked ABG- it showed Acidosis and hypercapnea   Used Bipap at night- slightly better ABG   He is very non compliant to CPAP.   I spoke to Dr. [name]- His  primary pulmonologist to address this issue.  CT chest angiogram is negative for PE or any infiltrates.   On ambulation his oxygen saturation dropping <80%, will keep on the same treatment.    If his saturation drops tomorrow on ambulation- he may need home oxygen.      * Sleep apnea Is advised to use CPAP, but he is not compliant with that. I spoke to him about following with Dr. [name] for this issue. He now agreed to keep using his CPAP at night.  * CKD- with now Hyperkalemia   Ordered kayxalate.    Pt refused for that.   I explained the risk of cardiac arrhythmia and arrest- he understand- but will like not to take it.   monitor on tele.   Stop ACE inhibitors.   Now potassium normal.  * Recent tooth infection  He visited ER on July 6, was prescribed erythromycin, feels better, continue that.  * History of coronary artery disease Being managed medically, continue aspirin , lisinopril, atorvastatin and metoprolol.  * Right leg swelling He denies any injury, pain, traveling history. negative venous Doppler to rule out DVT.  * Hypertension  Continue metoprolol , stop lisinopril.  * Diabetes  We'll manage with insulin sliding scale coverage.   Added basal coverage due to hyperglycemia.  * smoking   Councelled on admission to quit.  All the records are reviewed and case discussed with Care Management/Social Worker. Management plans discussed with the patient, family and they are in agreement.  CODE STATUS: full.  TOTAL TIME TAKING CARE OF THIS PATIENT: 35 minutes.  More than 50% of the visit was spent in counseling/coordination of care  POSSIBLE D/C IN 1-2 DAYS, DEPENDING ON CLINICAL CONDITION.   Vaughan Basta M.D on 08/17/2014   Between 7am to 6pm - Pager - [phone]  After 6pm go to [email or web address] - password [email or web address] [facility]  Office  [phone]  CC: Primary care physician; Pcp Not In System

## 2014-08-17 NOTE — Progress Notes (Signed)
Around 2150, 7/11- pt went downstairs to vending machine to get a snack without oxygen and level went down to 80% per telemetry clerk.  Patient is currently at 94% on 2L nasal cannula.  [name]  08/17/2014  5:06 AM

## 2014-08-17 NOTE — Progress Notes (Signed)
  Patient oxygen saturation while on room air (sitting) was 90. Patient oxygen saturation while on room air (after standing) was 84. Patient oxygen saturation while on room air (ambulating in hall) was 77.  Pt became slightly short of breath. Pt was placed back on 2L Oxygen and saturation came back up to 96 resting. Continue to assess.

## 2014-08-17 NOTE — Care Management (Signed)
Home O2 arranged through [place]. RN updated. Patient advised to follow up with  PCP [facility],  Dr. Vella Kohler pulmonary. Patient "wants to stay another night". I explained that his continued stay may not be covered if MD does not have medical necessity for continued stay- he will follow up with RN.

## 2014-08-18 LAB — GLUCOSE, CAPILLARY
Glucose-Capillary: 231 mg/dL — ABNORMAL HIGH (ref 65–99)
Glucose-Capillary: 256 mg/dL — ABNORMAL HIGH (ref 65–99)

## 2014-08-18 MED ORDER — PREDNISONE 10 MG PO TABS: ORAL_TABLET | ORAL | Status: DC

## 2014-08-18 MED ORDER — FENTANYL 25 MCG/HR TD PT72: MEDICATED_PATCH | TRANSDERMAL | Status: DC

## 2014-08-18 MED ORDER — FLUTICASONE PROPIONATE (INHAL) 100 MCG/BLIST IN AEPB
2.0000 | INHALATION_SPRAY | Freq: Two times a day (BID) | RESPIRATORY_TRACT | Status: DC
Start: 2014-08-18 — End: 2014-10-18

## 2014-08-18 NOTE — Progress Notes (Signed)
SATURATION QUALIFICATIONS: (This note is used to comply with regulatory documentation for home oxygen)  Patient Saturations on Room Air at Rest = 97%   Patient Saturations on Room Air while Ambulating = 78%  Patient Saturations on 2 Liters of oxygen while Ambulating = 91  Please briefly explain why patient needs home oxygen: Pt has COPD and desaturation with on exertion.   Villa Herb RN

## 2014-08-18 NOTE — Care Management Important Message (Signed)
Important Message  Patient Details  Name: [name] MRN: [id number] Date of Birth: [birth year]   Medicare Important Message Given:  Yes-third notification given    Juliann Pulse A Allmond 08/18/2014, 10:06 AM

## 2014-08-18 NOTE — Progress Notes (Signed)
Inpatient Diabetes Program Recommendations  AACE/ADA: New Consensus Statement on Inpatient Glycemic Control (2013)  Target Ranges:  Prepandial:   less than 140 mg/dL      Peak postprandial:   less than 180 mg/dL (1-2 hours)      Critically ill patients:  140 - 180 mg/dL   Results for [name], [name] (MRN [id number]) as of 08/18/2014 08:14  Ref. Range 08/17/2014 07:22 08/17/2014 12:25 08/17/2014 15:34 08/17/2014 21:24 08/18/2014 07:32  Glucose-Capillary Latest Ref Range: 65-99 mg/dL 355 (H) 330 (H) 248 (H) 290 (H) 231 (H)    Diabetes history: DM2 Outpatient Diabetes medications: Levemir 40 units QHS, Humalog 15-25 units TID with meals, Metformin 500 mg BID Current orders for Inpatient glycemic control: Novolog 0-9 units TID with meals, Levemir 15 units QHS  Inpatient Diabetes Program Recommendations Insulin - Basal: Please consider increasing Levemir to 20 units QHS. Correction (SSI): Please consider increasing Novolog correction to moderate scale and adding Novolog bedtime correction scale. Insulin - Meal Coverage: If steroids are continued, please consider ordering Novolog 6 units TID with meals for meal coverage (in addition to Novolog correction).  Note: Glucose ranged from 231-355 mg/dl over the past 24 hours and post prandial glucose is consistently elevated likely due to the steroids.  Thanks, Barnie Alderman, RN, MSN, CCRN, CDE Diabetes Coordinator Inpatient Diabetes Program [phone] (Team Pager from [place] to [place]) [phone] ([place] office) [phone] [facility] office) [phone] [facility] office)

## 2014-08-18 NOTE — Care Management (Signed)
O2 delivery setup with [employer]. Will Anderson to the room to deliver portable tank.

## 2014-08-18 NOTE — Discharge Summary (Signed)
[facility] [place] NAME: [name]    MR#:  [id number]  DATE OF BIRTH:  1970-03-10  DATE OF ADMISSION:  08/14/2014 ADMITTING PHYSICIAN: Vaughan Basta, MD  DATE OF DISCHARGE: 08/18/2014  PRIMARY CARE PHYSICIAN: Pcp Not In System    ADMISSION DIAGNOSIS:  Swelling [R60.9] COPD exacerbation [J44.1]  DISCHARGE DIAGNOSIS:  Active Problems:   COPD exacerbation   SECONDARY DIAGNOSIS:   Past Medical History  Diagnosis Date  . Hypertension   . Diabetes mellitus without complication   . COPD (chronic obstructive pulmonary disease)   . Hypercholesteremia unk  . Coronary artery disease   . Stroke   . Sleep apnea     does not use CPAP regularly.  . Sarcoidosis     HOSPITAL COURSE:   * Acute hypoxic and hypercapneic respiratory failure secondary to COPD exacerbation IV steroid, inhaled steroid, nebulizers, Spiriva, supplemental oxygen. Patient is already on erythromycin for his tooth infection, I will continue that. He was drowsy on admission- checked ABG- it showed Acidosis and hypercapnea  Used Bipap at night- slightly better ABG  He is very non compliant to CPAP.  I spoke to Dr. [name]- His primary pulmonologist to address this issue. CT chest angiogram is negative for PE or any infiltrates.  On ambulation his oxygen saturation dropping <80% but stable on resting on room air.   He agreed to use oxygen at home.   * Sleep apnea Is advised to use CPAP, but he is not compliant with that. I spoke to him about following with Dr. [name] for this issue. He now agreed to keep using his CPAP at night.  * CKD- with now Hyperkalemia  Ordered kayxalate.  Pt refused for that.  I explained the risk of cardiac arrhythmia and arrest- he understand- but will like not to take it.  monitor on tele.  Stop ACE inhibitors.  Now potassium normal.  * Recent tooth infection He visited ER on July 6, was  prescribed erythromycin for 10 days, feels better, continue that.  * History of coronary artery disease Being managed medically, continue aspirin ,atorvastatin and metoprolol.  * Right leg swelling He denies any injury, pain, traveling history. negative venous Doppler to rule out DVT.  * Hypertension Continue metoprolol , stop lisinopril.  * Diabetes We'll manage with insulin sliding scale coverage.  Added basal coverage due to hyperglycemia.  * smoking  Councelled on admission to quit.  DISCHARGE CONDITIONS:   stable  CONSULTS OBTAINED:  Treatment Team:  Allyne Gee, MD    Dr. [name].  DRUG ALLERGIES:   Allergies  Allergen Reactions  . Penicillins Anaphylaxis    DISCHARGE MEDICATIONS:   Current Discharge Medication List    START taking these medications   Details  Fluticasone Propionate, Inhal, (FLOVENT DISKUS) 100 MCG/BLIST AEPB Inhale 2 puffs into the lungs 2 (two) times daily. Qty: 60 each, Refills: 0    predniSONE (DELTASONE) 10 MG tablet Take 6 tabs first day, 5 tab on day 2, then 4 on day 3rd, 3 tabs on day 4th , 2 tab on day 5th, and 1 tab on 6th day. Qty: 21 tablet, Refills: 0      CONTINUE these medications which have CHANGED   Details  fentaNYL (DURAGESIC - DOSED MCG/HR) 25 MCG/HR patch Apply one patch every 72 hours. Remove old patch. External Use only. Rotate sites. Check placement of patch every shift Qty: 3 patch, Refills: 0  CONTINUE these medications which have NOT CHANGED   Details  albuterol (PROVENTIL HFA;VENTOLIN HFA) 108 (90 BASE) MCG/ACT inhaler Inhale 1-2 puffs into the lungs every 6 (six) hours as needed for wheezing or shortness of breath.     aspirin 81 MG chewable tablet Chew 81 mg by mouth every morning.    atorvastatin (LIPITOR) 40 MG tablet Take 40 mg by mouth at bedtime.     cloNIDine (CATAPRES - DOSED IN MG/24 HR) 0.2 mg/24hr patch Place 1 patch onto the skin once a week. On Monday    erythromycin base  (E-MYCIN) 500 MG tablet Take 1 tablet (500 mg total) by mouth 4 (four) times daily. Qty: 40 tablet, Refills: 0    Fluticasone-Salmeterol (ADVAIR) 250-50 MCG/DOSE AEPB Inhale 1 puff into the lungs 2 (two) times daily.    HUMALOG KWIKPEN 100 UNIT/ML KiwkPen Inject 15-25 Units into the skin every evening. At dinnertime    ibuprofen (ADVIL,MOTRIN) 800 MG tablet Take 1 tablet (800 mg total) by mouth every 8 (eight) hours as needed for moderate pain. Qty: 15 tablet, Refills: 0    insulin detemir (LEVEMIR) 100 UNIT/ML injection Inject 40 Units into the skin at bedtime.     isosorbide dinitrate (ISORDIL) 10 MG tablet Take 10 mg by mouth 3 (three) times daily as needed. For leg cramps    metFORMIN (GLUCOPHAGE) 500 MG tablet Take 500 mg by mouth 2 (two) times daily with a meal.    metoprolol (LOPRESSOR) 100 MG tablet Take 100 mg by mouth 2 (two) times daily.    SPIRIVA HANDIHALER 18 MCG inhalation capsule Place 18 mcg into inhaler and inhale daily.    traMADol (ULTRAM) 50 MG tablet Take 1 tablet (50 mg total) by mouth every 6 (six) hours as needed. Qty: 20 tablet, Refills: 0      STOP taking these medications     lisinopril (PRINIVIL,ZESTRIL) 10 MG tablet      fluticasone-salmeterol (ADVAIR HFA) 45-21 MCG/ACT inhaler          DISCHARGE INSTRUCTIONS:    Follow with Dr. [name] in 1 week.  If you experience worsening of your admission symptoms, develop shortness of breath, life threatening emergency, suicidal or homicidal thoughts you must seek medical attention immediately by calling 911 or calling your MD immediately  if symptoms less severe.  You Must read complete instructions/literature along with all the possible adverse reactions/side effects for all the Medicines you take and that have been prescribed to you. Take any new Medicines after you have completely understood and accept all the possible adverse reactions/side effects.   Please note  You were cared for by a  hospitalist during your hospital stay. If you have any questions about your discharge medications or the care you received while you were in the hospital after you are discharged, you can call the unit and asked to speak with the hospitalist on call if the hospitalist that took care of you is not available. Once you are discharged, your primary care physician will handle any further medical issues. Please note that NO REFILLS for any discharge medications will be authorized once you are discharged, as it is imperative that you return to your primary care physician (or establish a relationship with a primary care physician if you do not have one) for your aftercare needs so that they can reassess your need for medications and monitor your lab values.    Today   CHIEF COMPLAINT:   Chief Complaint  Patient presents with  .  Chest Pain  . Shortness of Breath    HISTORY OF PRESENT ILLNESS:  [name]  is a 44 y.o. male with a known history of COPD, hypertension, diabetes, hypercholesterol, coronary artery disease as complain of shortness of breath which is getting worse and constant for last 2 days gradually worsening so today morning decided to come to emergency room. He denies any associated cough or sputum production, fever, chest pain, chills. He did not seek any medical attention for this, was found hypoxic in the ER on arrival and initial treatment in ER did not help much so called in for admission.  The patient visited ER 3 days ago for his dental pain and was given ibuprofen, tramadol, erythromycin. Feels better now For this problem.   VITAL SIGNS:  Blood pressure 114/57, pulse 73, temperature 97.7 F (36.5 C), temperature source Oral, resp. rate 20, height 6' (1.829 m), weight 127.007 kg (280 lb), SpO2 92 %.  I/O:    Intake/Output Summary (Last 24 hours) at 08/18/14 0921 Last data filed at 08/18/14 0751  Gross per 24 hour  Intake    630 ml  Output   1350 ml  Net   -720 ml     PHYSICAL EXAMINATION:  GENERAL: 44 y.o.-year-old obase patient lying in the bed with no acute distress.  EYES: Pupils equal, round, reactive to light and accommodation. No scleral icterus. Extraocular muscles intact.  HEENT: Head atraumatic, normocephalic. Oropharynx and nasopharynx clear.  NECK: Supple, no jugular venous distention. No thyroid enlargement, no tenderness.  LUNGS: Decreased breath sounds bilaterally, some wheezing, No crepitation. No use of accessory muscles of respiration.  CARDIOVASCULAR: S1, S2 normal. No murmurs, rubs, or gallops.  ABDOMEN: Soft, nontender, nondistended. Bowel sounds present. No organomegaly or mass.  EXTREMITIES:right leg pedal edema, No cyanosis, or clubbing.  NEUROLOGIC: Cranial nerves II through XII are intact. Muscle strength 5/5 in all extremities. Sensation intact. Gait not checked.  PSYCHIATRIC: The patient is alert and oriented x 3.  SKIN: No obvious rash, lesion, or ulcer.   DATA REVIEW:   CBC  Recent Labs Lab 08/15/14 0528  WBC 12.3*  HGB 16.3  HCT 51.9  PLT 240    Chemistries   Recent Labs Lab 08/14/14 0455  08/17/14 0541  NA 142  < > 138  K 4.3  < > 4.7  CL 103  < > 97*  CO2 34*  < > 33*  GLUCOSE 127*  < > 303*  BUN 19  < > 27*  CREATININE 1.37*  < > 1.34*  CALCIUM 8.6*  < > 8.7*  AST 24  --   --   ALT 23  --   --   ALKPHOS 79  --   --   BILITOT 0.5  --   --   < > = values in this interval not displayed.  Cardiac Enzymes  Recent Labs Lab 08/14/14 0455  TROPONINI 0.05*    Microbiology Results  Results for orders placed or performed in visit on 06/01/13  Clostridium Difficile [facility])     Status: None   Collection Time: 06/01/13 11:39 AM  Result Value Ref Range Status   Micro Text Report   Final       C.DIFFICILE ANTIGEN       C.DIFFICILE G[facility] ANTIGEN : POSITIVE   C.DIFFICILE TOXIN A/B     C.DIFFICILE TOXINS A AND B : NEGATIVE   PCR FOR TOXIGENIC C.DIFF  PCR FOR TOXIGENIC C.DIFFICILE :  POSITIVE   INTERPRETATION  Positive for toxigenic C. difficile, active toxin production NOT detected.  Patient has toxigenic C. difficile organisms present in the bowel, but toxin was not detected.  The patient may be a carrier or the level of toxin in  the sample was below the limit of detection. This information should be used in conjunction with the patient's clinical history when deciding on possible therapy.   ANTIBIOTIC                                                        RADIOLOGY:  No results found.   Management plans discussed with the patient, family and they are in agreement.  CODE STATUS:     Code Status Orders        Start     Ordered   08/14/14 1032  Full code   Continuous     08/14/14 1031      TOTAL TIME TAKING CARE OF THIS PATIENT: 40 minutes.    Vaughan Basta M.D on 08/18/2014 at 9:21 AM  Between 7am to 6pm - Pager - [phone]  After 6pm go to [email or web address] - password [email or web address] [facility]  Office  [phone]  CC: Primary care physician; Pcp Not In System

## 2014-08-18 NOTE — Progress Notes (Signed)
Ambulated pt around nurses station, O2 sats dropped to 78% on room air. Called Dr. Marthann Schiller, home 02 to be arranged. Spoke with Liliane Channel from Case Management to arrange. Villa Herb RN

## 2014-08-19 LAB — THYROID PANEL WITH TSH
Free Thyroxine Index: 1.4 (ref 1.2–4.9)
T3 UPTAKE RATIO: 37 % (ref 24–39)
T4, Total: 3.7 ug/dL — ABNORMAL LOW (ref 4.5–12.0)
TSH: 0.202 u[IU]/mL — ABNORMAL LOW (ref 0.450–4.500)

## 2014-08-23 ENCOUNTER — Other Ambulatory Visit: Payer: Self-pay

## 2014-08-23 ENCOUNTER — Emergency Department: Payer: Medicare Other

## 2014-08-23 DIAGNOSIS — Z79899 Other long term (current) drug therapy: Secondary | ICD-10-CM

## 2014-08-23 DIAGNOSIS — I2510 Atherosclerotic heart disease of native coronary artery without angina pectoris: Secondary | ICD-10-CM | POA: Diagnosis present

## 2014-08-23 DIAGNOSIS — G4733 Obstructive sleep apnea (adult) (pediatric): Secondary | ICD-10-CM | POA: Diagnosis present

## 2014-08-23 DIAGNOSIS — E119 Type 2 diabetes mellitus without complications: Secondary | ICD-10-CM | POA: Diagnosis present

## 2014-08-23 DIAGNOSIS — J9621 Acute and chronic respiratory failure with hypoxia: Secondary | ICD-10-CM | POA: Diagnosis not present

## 2014-08-23 DIAGNOSIS — Z7982 Long term (current) use of aspirin: Secondary | ICD-10-CM

## 2014-08-23 DIAGNOSIS — Z794 Long term (current) use of insulin: Secondary | ICD-10-CM

## 2014-08-23 DIAGNOSIS — Z880 Allergy status to penicillin: Secondary | ICD-10-CM

## 2014-08-23 DIAGNOSIS — I509 Heart failure, unspecified: Secondary | ICD-10-CM | POA: Diagnosis present

## 2014-08-23 DIAGNOSIS — J449 Chronic obstructive pulmonary disease, unspecified: Secondary | ICD-10-CM | POA: Diagnosis present

## 2014-08-23 DIAGNOSIS — E785 Hyperlipidemia, unspecified: Secondary | ICD-10-CM | POA: Diagnosis present

## 2014-08-23 DIAGNOSIS — F17210 Nicotine dependence, cigarettes, uncomplicated: Secondary | ICD-10-CM | POA: Diagnosis present

## 2014-08-23 DIAGNOSIS — I10 Essential (primary) hypertension: Secondary | ICD-10-CM | POA: Diagnosis present

## 2014-08-23 DIAGNOSIS — E780 Pure hypercholesterolemia: Secondary | ICD-10-CM | POA: Diagnosis present

## 2014-08-23 DIAGNOSIS — D869 Sarcoidosis, unspecified: Secondary | ICD-10-CM | POA: Diagnosis present

## 2014-08-23 LAB — CBC
HCT: 48.1 % (ref 40.0–52.0)
Hemoglobin: 15.3 g/dL (ref 13.0–18.0)
MCH: 27.1 pg (ref 26.0–34.0)
MCHC: 31.9 g/dL — ABNORMAL LOW (ref 32.0–36.0)
MCV: 84.9 fL (ref 80.0–100.0)
Platelets: 189 10*3/uL (ref 150–440)
RBC: 5.67 MIL/uL (ref 4.40–5.90)
RDW: 14.9 % — ABNORMAL HIGH (ref 11.5–14.5)
WBC: 14 10*3/uL — AB (ref 3.8–10.6)

## 2014-08-23 NOTE — ED Notes (Signed)
Pt in with co increased bilat leg swelling was admittted last week for CHF and discharged Friday.  Pt has no shob at this time, no chest pain at this time.

## 2014-08-24 ENCOUNTER — Encounter: Payer: Self-pay | Admitting: Internal Medicine

## 2014-08-24 ENCOUNTER — Inpatient Hospital Stay
Admission: EM | Admit: 2014-08-24 | Discharge: 2014-08-26 | DRG: 189 | Disposition: A | Discharge status: Home / Self Care | Payer: Medicare Other | Attending: Internal Medicine | Admitting: Internal Medicine

## 2014-08-24 DIAGNOSIS — Z880 Allergy status to penicillin: Secondary | ICD-10-CM | POA: Diagnosis not present

## 2014-08-24 DIAGNOSIS — E785 Hyperlipidemia, unspecified: Secondary | ICD-10-CM | POA: Diagnosis present

## 2014-08-24 DIAGNOSIS — G4733 Obstructive sleep apnea (adult) (pediatric): Secondary | ICD-10-CM | POA: Diagnosis present

## 2014-08-24 DIAGNOSIS — D869 Sarcoidosis, unspecified: Secondary | ICD-10-CM | POA: Diagnosis present

## 2014-08-24 DIAGNOSIS — J9621 Acute and chronic respiratory failure with hypoxia: Secondary | ICD-10-CM | POA: Diagnosis present

## 2014-08-24 DIAGNOSIS — I2510 Atherosclerotic heart disease of native coronary artery without angina pectoris: Secondary | ICD-10-CM | POA: Diagnosis present

## 2014-08-24 DIAGNOSIS — F17210 Nicotine dependence, cigarettes, uncomplicated: Secondary | ICD-10-CM | POA: Diagnosis present

## 2014-08-24 DIAGNOSIS — E780 Pure hypercholesterolemia: Secondary | ICD-10-CM | POA: Diagnosis present

## 2014-08-24 DIAGNOSIS — Z79899 Other long term (current) drug therapy: Secondary | ICD-10-CM | POA: Diagnosis not present

## 2014-08-24 DIAGNOSIS — I10 Essential (primary) hypertension: Secondary | ICD-10-CM | POA: Diagnosis present

## 2014-08-24 DIAGNOSIS — Z7982 Long term (current) use of aspirin: Secondary | ICD-10-CM | POA: Diagnosis not present

## 2014-08-24 DIAGNOSIS — I509 Heart failure, unspecified: Secondary | ICD-10-CM | POA: Diagnosis present

## 2014-08-24 DIAGNOSIS — J449 Chronic obstructive pulmonary disease, unspecified: Secondary | ICD-10-CM | POA: Diagnosis present

## 2014-08-24 DIAGNOSIS — E119 Type 2 diabetes mellitus without complications: Secondary | ICD-10-CM | POA: Diagnosis present

## 2014-08-24 DIAGNOSIS — Z794 Long term (current) use of insulin: Secondary | ICD-10-CM | POA: Diagnosis not present

## 2014-08-24 LAB — TROPONIN I
TROPONIN I: 0.03 ng/mL (ref <0.031)
Troponin I: 0.04 ng/mL — ABNORMAL HIGH (ref <0.031)
Troponin I: 0.04 ng/mL — ABNORMAL HIGH (ref <0.031)

## 2014-08-24 LAB — BASIC METABOLIC PANEL
ANION GAP: 9 (ref 5–15)
BUN: 23 mg/dL — ABNORMAL HIGH (ref 6–20)
CO2: 35 mmol/L — ABNORMAL HIGH (ref 22–32)
CREATININE: 1.37 mg/dL — AB (ref 0.61–1.24)
Calcium: 8.6 mg/dL — ABNORMAL LOW (ref 8.9–10.3)
Chloride: 96 mmol/L — ABNORMAL LOW (ref 101–111)
GFR calc Af Amer: >60 mL/min (ref >60)
Glucose, Bld: 112 mg/dL — ABNORMAL HIGH (ref 65–99)
Potassium: 4.8 mmol/L (ref 3.5–5.1)
Sodium: 140 mmol/L (ref 135–145)

## 2014-08-24 LAB — GLUCOSE, CAPILLARY
GLUCOSE-CAPILLARY: 237 mg/dL — AB (ref 65–99)
Glucose-Capillary: 148 mg/dL — ABNORMAL HIGH (ref 65–99)
Glucose-Capillary: 170 mg/dL — ABNORMAL HIGH (ref 65–99)
Glucose-Capillary: 170 mg/dL — ABNORMAL HIGH (ref 65–99)

## 2014-08-24 LAB — BRAIN NATRIURETIC PEPTIDE: B Natriuretic Peptide: 111 pg/mL — ABNORMAL HIGH (ref 0.0–100.0)

## 2014-08-24 MED ORDER — FUROSEMIDE 10 MG/ML IJ SOLN
40.0000 mg | Freq: Two times a day (BID) | INTRAMUSCULAR | Status: DC
  Administered 2014-08-24 – 2014-08-25 (×2): 40 mg via INTRAVENOUS
  Filled 2014-08-24 (×2): qty 4

## 2014-08-24 MED ORDER — CLONIDINE HCL 0.2 MG/24HR TD PTWK
0.2000 mg | MEDICATED_PATCH | TRANSDERMAL | Status: DC
  Administered 2014-08-24: 0.2 mg via TRANSDERMAL
  Filled 2014-08-24 (×2): qty 1

## 2014-08-24 MED ORDER — INSULIN ASPART 100 UNIT/ML ~~LOC~~ SOLN
0.0000 [IU] | Freq: Every day | SUBCUTANEOUS | Status: DC
  Administered 2014-08-26: 3 [IU] via SUBCUTANEOUS

## 2014-08-24 MED ORDER — ACETAMINOPHEN 650 MG RE SUPP: 650.0000 mg | Freq: Four times a day (QID) | RECTAL | Status: DC | PRN

## 2014-08-24 MED ORDER — METOPROLOL TARTRATE 100 MG PO TABS
100.0000 mg | ORAL_TABLET | Freq: Two times a day (BID) | ORAL | Status: DC
Start: 2014-08-24 — End: 2014-08-26
  Administered 2014-08-24 – 2014-08-26 (×5): 100 mg via ORAL
  Filled 2014-08-24 (×5): qty 1

## 2014-08-24 MED ORDER — HEPARIN SODIUM (PORCINE) 5000 UNIT/ML IJ SOLN
5000.0000 [IU] | Freq: Three times a day (TID) | INTRAMUSCULAR | Status: DC
  Administered 2014-08-24 – 2014-08-26 (×7): 5000 [IU] via SUBCUTANEOUS
  Filled 2014-08-24 (×7): qty 1

## 2014-08-24 MED ORDER — ASPIRIN 81 MG PO CHEW
81.0000 mg | CHEWABLE_TABLET | ORAL | Status: DC
  Administered 2014-08-24 – 2014-08-26 (×3): 81 mg via ORAL
  Filled 2014-08-24 (×3): qty 1

## 2014-08-24 MED ORDER — MORPHINE SULFATE 2 MG/ML IJ SOLN: 2.0000 mg | INTRAMUSCULAR | Status: DC | PRN

## 2014-08-24 MED ORDER — ISOSORBIDE DINITRATE 10 MG PO TABS
10.0000 mg | ORAL_TABLET | Freq: Three times a day (TID) | ORAL | Status: DC
  Administered 2014-08-24 – 2014-08-26 (×7): 10 mg via ORAL
  Filled 2014-08-24 (×9): qty 1

## 2014-08-24 MED ORDER — FUROSEMIDE 10 MG/ML IJ SOLN
INTRAMUSCULAR | Status: AC
  Administered 2014-08-24: 40 mg via INTRAVENOUS
  Filled 2014-08-24: qty 4

## 2014-08-24 MED ORDER — SODIUM CHLORIDE 0.9 % IJ SOLN
3.0000 mL | Freq: Two times a day (BID) | INTRAMUSCULAR | Status: DC
  Administered 2014-08-24 – 2014-08-25 (×5): 3 mL via INTRAVENOUS

## 2014-08-24 MED ORDER — FENTANYL 25 MCG/HR TD PT72
25.0000 ug | MEDICATED_PATCH | TRANSDERMAL | Status: DC
  Filled 2014-08-24 (×2): qty 1

## 2014-08-24 MED ORDER — ACETAMINOPHEN 325 MG PO TABS
650.0000 mg | ORAL_TABLET | Freq: Four times a day (QID) | ORAL | Status: DC | PRN
  Administered 2014-08-24: 650 mg via ORAL
  Filled 2014-08-24: qty 2

## 2014-08-24 MED ORDER — FUROSEMIDE 10 MG/ML IJ SOLN
40.0000 mg | Freq: Once | INTRAMUSCULAR | Status: AC
  Administered 2014-08-24: 40 mg via INTRAVENOUS

## 2014-08-24 MED ORDER — TIOTROPIUM BROMIDE MONOHYDRATE 18 MCG IN CAPS
18.0000 ug | ORAL_CAPSULE | Freq: Every day | RESPIRATORY_TRACT | Status: DC
  Administered 2014-08-24 – 2014-08-26 (×3): 18 ug via RESPIRATORY_TRACT
  Filled 2014-08-24 (×2): qty 5

## 2014-08-24 MED ORDER — IPRATROPIUM-ALBUTEROL 0.5-2.5 (3) MG/3ML IN SOLN
3.0000 mL | Freq: Four times a day (QID) | RESPIRATORY_TRACT | Status: DC
  Administered 2014-08-24 – 2014-08-26 (×9): 3 mL via RESPIRATORY_TRACT
  Filled 2014-08-24 (×9): qty 3

## 2014-08-24 MED ORDER — FUROSEMIDE 10 MG/ML IJ SOLN
40.0000 mg | Freq: Every day | INTRAMUSCULAR | Status: DC
  Administered 2014-08-24: 40 mg via INTRAVENOUS
  Filled 2014-08-24: qty 4

## 2014-08-24 MED ORDER — MOMETASONE FURO-FORMOTEROL FUM 100-5 MCG/ACT IN AERO
2.0000 | INHALATION_SPRAY | Freq: Two times a day (BID) | RESPIRATORY_TRACT | Status: DC
  Administered 2014-08-24 – 2014-08-26 (×5): 2 via RESPIRATORY_TRACT
  Filled 2014-08-24: qty 8.8

## 2014-08-24 MED ORDER — INSULIN DETEMIR 100 UNIT/ML ~~LOC~~ SOLN
40.0000 [IU] | Freq: Every day | SUBCUTANEOUS | Status: DC
  Administered 2014-08-24 – 2014-08-25 (×2): 40 [IU] via SUBCUTANEOUS
  Filled 2014-08-24 (×3): qty 0.4

## 2014-08-24 MED ORDER — ATORVASTATIN CALCIUM 20 MG PO TABS
40.0000 mg | ORAL_TABLET | Freq: Every day | ORAL | Status: DC
  Administered 2014-08-24 – 2014-08-25 (×2): 40 mg via ORAL
  Filled 2014-08-24 (×2): qty 2

## 2014-08-24 MED ORDER — INSULIN ASPART 100 UNIT/ML ~~LOC~~ SOLN
0.0000 [IU] | Freq: Three times a day (TID) | SUBCUTANEOUS | Status: DC
  Administered 2014-08-24: 3 [IU] via SUBCUTANEOUS
  Administered 2014-08-24: 5 [IU] via SUBCUTANEOUS
  Administered 2014-08-24: 2 [IU] via SUBCUTANEOUS
  Administered 2014-08-25: 5 [IU] via SUBCUTANEOUS
  Administered 2014-08-25: 2 [IU] via SUBCUTANEOUS
  Administered 2014-08-25 – 2014-08-26 (×3): 3 [IU] via SUBCUTANEOUS
  Filled 2014-08-24 (×2): qty 3
  Filled 2014-08-24: qty 2
  Filled 2014-08-24: qty 5
  Filled 2014-08-24: qty 2
  Filled 2014-08-24: qty 5
  Filled 2014-08-24: qty 3
  Filled 2014-08-24: qty 5

## 2014-08-24 NOTE — ED Provider Notes (Signed)
[facility] Emergency Department Provider Note  ____________________________________________  Time seen: 12:30 AM  I have reviewed the triage vital signs and the nursing notes.   HISTORY  Chief Complaint Leg Swelling      HPI [name] is a 44 y.o. male presents with bilateral lower extremity edema, dyspnea. Initial O2 sat 2 saturation 85% on room air on presentation to the emergency department Patient denies any chest pain. Patient denies any dizziness no nausea no vomiting    Past Medical History  Diagnosis Date  . Hypertension   . Diabetes mellitus without complication   . COPD (chronic obstructive pulmonary disease)   . Hypercholesteremia unk  . Coronary artery disease   . Stroke   . Sleep apnea     does not use CPAP regularly.  . Sarcoidosis     Patient Active Problem List   Diagnosis Date Noted  . COPD exacerbation 08/14/2014  . Acute and chronic respiratory failure 04/22/2013  . Chronic airway obstruction, not elsewhere classified 04/22/2013  . Sarcoidosis 04/22/2013  . CHF (congestive heart failure) 04/22/2013  . Unspecified late effects of cerebrovascular disease 04/22/2013  . Other and unspecified hyperlipidemia 04/22/2013  . Tracheostomy complication, unspecified 04/22/2013    Past Surgical History  Procedure Laterality Date  . Appendectomy      Current Outpatient Rx  Name  Route  Sig  Dispense  Refill  . albuterol (PROVENTIL HFA;VENTOLIN HFA) 108 (90 BASE) MCG/ACT inhaler   Inhalation   Inhale 1-2 puffs into the lungs every 6 (six) hours as needed for wheezing or shortness of breath.          Marland Kitchen aspirin 81 MG chewable tablet   Oral   Chew 81 mg by mouth every morning.         Marland Kitchen atorvastatin (LIPITOR) 40 MG tablet   Oral   Take 40 mg by mouth at bedtime.          . cloNIDine (CATAPRES - DOSED IN MG/24 HR) 0.2 mg/24hr patch   Transdermal   Place 1 patch onto the skin once a week. On Monday         .  fentaNYL (DURAGESIC - DOSED MCG/HR) 25 MCG/HR patch      Apply one patch every 72 hours. Remove old patch. External Use only. Rotate sites. Check placement of patch every shift   3 patch   0   . Fluticasone Propionate, Inhal, (FLOVENT DISKUS) 100 MCG/BLIST AEPB   Inhalation   Inhale 2 puffs into the lungs 2 (two) times daily.   60 each   0   . Fluticasone-Salmeterol (ADVAIR) 250-50 MCG/DOSE AEPB   Inhalation   Inhale 1 puff into the lungs 2 (two) times daily.         Marland Kitchen HUMALOG KWIKPEN 100 UNIT/ML KiwkPen   Subcutaneous   Inject 15-25 Units into the skin every evening. At dinnertime           Dispense as written.   Marland Kitchen ibuprofen (ADVIL,MOTRIN) 800 MG tablet   Oral   Take 1 tablet (800 mg total) by mouth every 8 (eight) hours as needed for moderate pain.   15 tablet   0   . insulin detemir (LEVEMIR) 100 UNIT/ML injection   Subcutaneous   Inject 40 Units into the skin at bedtime.          . isosorbide dinitrate (ISORDIL) 10 MG tablet   Oral   Take 10 mg by mouth 3 (three) times  daily as needed. For leg cramps         . metFORMIN (GLUCOPHAGE) 500 MG tablet   Oral   Take 500 mg by mouth 2 (two) times daily with a meal.         . metoprolol (LOPRESSOR) 100 MG tablet   Oral   Take 100 mg by mouth 2 (two) times daily.         . predniSONE (DELTASONE) 10 MG tablet      Take 6 tabs first day, 5 tab on day 2, then 4 on day 3rd, 3 tabs on day 4th , 2 tab on day 5th, and 1 tab on 6th day.   21 tablet   0   . SPIRIVA HANDIHALER 18 MCG inhalation capsule   Inhalation   Place 18 mcg into inhaler and inhale daily.           Dispense as written.   . traMADol (ULTRAM) 50 MG tablet   Oral   Take 1 tablet (50 mg total) by mouth every 6 (six) hours as needed.   20 tablet   0     Allergies Penicillins  Family History  Problem Relation Age of Onset  . Hypertension Mother   . Heart disease Mother   . Diabetes Mother     Social History History  Substance  Use Topics  . Smoking status: Current Every Day Smoker -- 0.33 packs/day    Types: Cigarettes  . Smokeless tobacco: Not on file  . Alcohol Use: Yes     Comment: occassional    Review of Systems  Constitutional: Negative for fever. Eyes: Negative for visual changes. ENT: Negative for sore throat. Cardiovascular: Positive for chest pain. Respiratory: positive for shortness of breath. Gastrointestinal: Negative for abdominal pain, vomiting and diarrhea. Genitourinary: Negative for dysuria. Musculoskeletal: Negative for back pain. Skin: Negative for rash. Neurological: Negative for headaches, focal weakness or numbness.   10-point ROS otherwise negative.  ____________________________________________   PHYSICAL EXAM:  VITAL SIGNS: ED Triage Vitals  Enc Vitals Group     BP 08/23/14 2259 147/89 mmHg     Pulse Rate 08/23/14 2259 96     Resp 08/23/14 2259 18     Temp 08/23/14 2259 99 F (37.2 C)     Temp Source 08/23/14 2259 Oral     SpO2 08/23/14 2259 96 %     Weight 08/23/14 2259 290 lb (131.543 kg)     Height 08/23/14 2259 6' (1.829 m)     Head Cir --      Peak Flow --      Pain Score 08/23/14 2259 7     Pain Loc --      Pain Edu? --      Excl. in [place]? --      Constitutional: Alert and oriented. Well appearing and in no distress. Eyes: Conjunctivae are normal. PERRL. Normal extraocular movements. ENT   Head: Normocephalic and atraumatic.   Nose: No congestion/rhinnorhea.   Mouth/Throat: Mucous membranes are moist.   Neck: No stridor. Cardiovascular: Normal rate, regular rhythm. Normal and symmetric distal pulses are present in all extremities. No murmurs, rubs, or gallops. Respiratory: Normal respiratory effort without tachypnea nor retractions. Breath sounds are clear and equal bilaterally. Mild expiratory wheeze Gastrointestinal: Soft and nontender. No distention. There is no CVA tenderness. Genitourinary: deferred Musculoskeletal: Nontender with  normal range of motion in all extremities. No joint effusions.  No lower extremity tenderness. 2+ bilateral lower extremity Neurologic:  Normal  speech and language. No gross focal neurologic deficits are appreciated. Speech is normal.  Skin:  Skin is warm, dry and intact. No rash noted. Psychiatric: Mood and affect are normal. Speech and behavior are normal. Patient exhibits appropriate insight and judgment.  ____________________________________________    LABS (pertinent positives/negatives)  Labs Reviewed  BASIC METABOLIC PANEL - Abnormal; Notable for the following:    Chloride 96 (*)    CO2 35 (*)    Glucose, Bld 112 (*)    BUN 23 (*)    Creatinine, Ser 1.37 (*)    Calcium 8.6 (*)    All other components within normal limits  CBC - Abnormal; Notable for the following:    WBC 14.0 (*)    MCHC 31.9 (*)    RDW 14.9 (*)    All other components within normal limits  TROPONIN I - Abnormal; Notable for the following:    Troponin I 0.04 (*)    All other components within normal limits  BRAIN NATRIURETIC PEPTIDE - Abnormal; Notable for the following:    B Natriuretic Peptide 111.0 (*)    All other components within normal limits     ____________________________________________   EKG  ED ECG REPORT I, Troy Kanouse, [place], the attending physician, personally viewed and interpreted this ECG.   Date: 08/24/2014  EKG Time: 23:04  Rate: 96  Rhythm: Normal sinus rhythm  Axis: none  Intervals:normal  ST&T Change: none   ____________________________________________    RADIOLOGY Chest x-ray revealed:  IMPRESSION: No acute cardiopulmonary process.  Bilateral lung scarring, LEFT lung base pleural thickening.   Electronically Signed By: Elon Alas M.D. On: 08/24/2014 00:53  ____________________________________________     INITIAL IMPRESSION / ASSESSMENT AND PLAN / ED COURSE  Pertinent labs & imaging results that were available during my care of the  patient were reviewed by me and considered in my medical decision making (see chart for details).  She received Lasix 40 mg IV. Will admit patient to the hospital further evaluation  ____________________________________________   FINAL CLINICAL IMPRESSION(S) / ED DIAGNOSES  Final diagnoses:  Acute on chronic respiratory failure with hypoxia      Gregor Hams, MD 08/27/14 [phone]

## 2014-08-24 NOTE — Progress Notes (Signed)
Alert and oriented. Only complained of a headache once, relieved with tylenol. Patient reports his edema has already improved, still +2 in BLE. Sinus rhythm on tele. Will continue to monitor.

## 2014-08-24 NOTE — Care Management (Signed)
Patient discharged from [facility] 7/13 with new home 02.  Patient says during this stay he remain short of breath and had swelling in his lower extremities right leg > left.  Patient is disabled due to "my lungs"  He has had previous MI and CVA 2014 and sarcodosis.  He says he keeps all of his physician appointments.  Is followed by Dr Vella Kohler, Neoma Laming and [facility].   CXR does not show any acute disease.  It is felt that patient's respiratory issues are due to right heart failure.  Patient verbalizes that he was suppose to have been on lasix but it was stopped over one year ago and "nobody thought to start it back."  He verbalizes that he takes his medications as ordered but "might not always use the cpap."  Patient has had one ED visit this year for facial pain, then presented again and was admitted.  This make the second admission in 6 months for patient with COPD.  He does not meet the criteria for COPD Gold.   Patient would benefit from home health nurse at discharge.  During this extreme heat and with new intensity of dyspnea, it is taking a taxing effort for patient to leave his home.  He is agreeable

## 2014-08-24 NOTE — H&P (Signed)
[facility] [facility] [place] NAME: [name]    MR#:  [id number]  DATE OF BIRTH:  29-Jul-1970   DATE OF ADMISSION:  08/24/2014  PRIMARY CARE PHYSICIAN: Pcp Not In [facility]: Humphrey Rolls  REQUESTING/REFERRING PHYSICIAN: Marjean Donna  CHIEF COMPLAINT:   Chief Complaint  Patient presents with  . Leg Swelling    HISTORY OF PRESENT ILLNESS:  [name]  is a 44 y.o. male with a known history of COPD non-oxygen dependent, check sleep apnea requiring CPAP therapy, coronary artery disease, essential hypertension presenting with leg edema. Recently discharged from [facility] 08/18/2014 discharge diagnosis COPD exacerbation. However since discharge has been experiencing worsening lower extremity edema with associated dyspnea on exertion. He however denies any chest pain, cough, orthopnea, paroxysmal nocturnal dyspnea. She notes that approximately 2 years ago he was on diuretics but there is stopped for an unknown reason and never restarted. Emergency Department course desaturate in the mid 80s on room air  PAST MEDICAL HISTORY:   Past Medical History  Diagnosis Date  . Hypertension   . Diabetes mellitus without complication   . COPD (chronic obstructive pulmonary disease)   . Hypercholesteremia unk  . Coronary artery disease   . Stroke   . Sleep apnea     does not use CPAP regularly.  . Sarcoidosis     PAST SURGICAL HISTORY:   Past Surgical History  Procedure Laterality Date  . Appendectomy      SOCIAL HISTORY:   History  Substance Use Topics  . Smoking status: Current Every Day Smoker -- 0.33 packs/day    Types: Cigarettes  . Smokeless tobacco: Not on file  . Alcohol Use: Yes     Comment: occassional    FAMILY HISTORY:   Family History  Problem Relation Age of Onset  . Hypertension Mother   . Heart disease Mother   . Diabetes Mother     DRUG ALLERGIES:   Allergies   Allergen Reactions  . Penicillins Anaphylaxis    REVIEW OF SYSTEMS:  REVIEW OF SYSTEMS:  CONSTITUTIONAL: Denies fevers, chills, fatigue, weakness.  EYES: Denies blurred vision, double vision, or eye pain.  EARS, NOSE, THROAT: Denies tinnitus, ear pain, hearing loss.  RESPIRATORY: denies cough, shortness of breath, wheezing  CARDIOVASCULAR: Denies chest pain, palpitations,positive  edema.  GASTROINTESTINAL: Denies nausea, vomiting, diarrhea, abdominal pain.  GENITOURINARY: Denies dysuria, hematuria.  ENDOCRINE: Denies nocturia or thyroid problems. HEMATOLOGIC AND LYMPHATIC: Denies easy bruising or bleeding.  SKIN: Denies rash or lesions.  MUSCULOSKELETAL: Denies pain in neck, back, shoulder, knees, hips, or further arthritic symptoms.  NEUROLOGIC: Denies paralysis, paresthesias.  PSYCHIATRIC: Denies anxiety or depressive symptoms. Otherwise full review of systems performed by me is negative.   MEDICATIONS AT HOME:   Prior to Admission medications   Medication Sig Start Date End Date Taking? Authorizing Provider  albuterol (PROVENTIL HFA;VENTOLIN HFA) 108 (90 BASE) MCG/ACT inhaler Inhale 1-2 puffs into the lungs every 6 (six) hours as needed for wheezing or shortness of breath.     Historical Provider, MD  aspirin 81 MG chewable tablet Chew 81 mg by mouth every morning.    Historical Provider, MD  atorvastatin (LIPITOR) 40 MG tablet Take 40 mg by mouth at bedtime.     Historical Provider, MD  cloNIDine (CATAPRES - DOSED IN MG/24 HR) 0.2 mg/24hr patch Place 1 patch onto the skin once a week. On Monday 08/07/14   Historical Provider, MD  fentaNYL (  DURAGESIC - DOSED MCG/HR) 25 MCG/HR patch Apply one patch every 72 hours. Remove old patch. External Use only. Rotate sites. Check placement of patch every shift 08/18/14   Vaughan Basta, MD  Fluticasone Propionate, Inhal, (FLOVENT DISKUS) 100 MCG/BLIST AEPB Inhale 2 puffs into the lungs 2 (two) times daily. 08/18/14   Vaughan Basta, MD  Fluticasone-Salmeterol (ADVAIR) 250-50 MCG/DOSE AEPB Inhale 1 puff into the lungs 2 (two) times daily.    Historical Provider, MD  HUMALOG KWIKPEN 100 UNIT/ML KiwkPen Inject 15-25 Units into the skin every evening. At dinnertime 08/07/14   Historical Provider, MD  ibuprofen (ADVIL,MOTRIN) 800 MG tablet Take 1 tablet (800 mg total) by mouth every 8 (eight) hours as needed for moderate pain. 08/11/14   Sable Feil, PA-C  insulin detemir (LEVEMIR) 100 UNIT/ML injection Inject 40 Units into the skin at bedtime.     Historical Provider, MD  isosorbide dinitrate (ISORDIL) 10 MG tablet Take 10 mg by mouth 3 (three) times daily as needed. For leg cramps    Historical Provider, MD  metFORMIN (GLUCOPHAGE) 500 MG tablet Take 500 mg by mouth 2 (two) times daily with a meal.    Historical Provider, MD  metoprolol (LOPRESSOR) 100 MG tablet Take 100 mg by mouth 2 (two) times daily.    Historical Provider, MD  predniSONE (DELTASONE) 10 MG tablet Take 6 tabs first day, 5 tab on day 2, then 4 on day 3rd, 3 tabs on day 4th , 2 tab on day 5th, and 1 tab on 6th day. 08/19/14   Vaughan Basta, MD  SPIRIVA HANDIHALER 18 MCG inhalation capsule Place 18 mcg into inhaler and inhale daily. 07/30/14   Historical Provider, MD  traMADol (ULTRAM) 50 MG tablet Take 1 tablet (50 mg total) by mouth every 6 (six) hours as needed. 08/11/14 08/11/15  Sable Feil, PA-C      VITAL SIGNS:  Blood pressure 147/89, pulse 96, temperature 99 F (37.2 C), temperature source Oral, resp. rate 18, height 6' (1.829 m), weight 290 lb (131.543 kg), SpO2 96 %.  PHYSICAL EXAMINATION:  VITAL SIGNS: Filed Vitals:   08/23/14 2259  BP: 147/89  Pulse: 96  Temp: 99 F (37.2 C)  Resp: 18   GENERAL:44 y.o.male currently in no acute distress.  HEAD: Normocephalic, atraumatic.  EYES: Pupils equal, round, reactive to light. Extraocular muscles intact. No scleral icterus.  MOUTH: Moist mucosal membrane. Dentition intact. No abscess  noted.  EAR, NOSE, THROAT: Clear without exudates. No external lesions.  NECK: Supple. No thyromegaly. No nodules. No JVD.  PULMONARY: Clear to ascultation, without wheeze rails or rhonci. No use of accessory muscles, poor respiratory effort. Poor air entry bilaterally CHEST: Nontender to palpation.  CARDIOVASCULAR: S1 and S2. Regular rate and rhythm. No murmurs, rubs, or gallops.2+ lower extremity edema to shins bilaterally. Pedal pulses 2+ bilaterally.  GASTROINTESTINAL: Soft, nontender, nondistended. No masses. Positive bowel sounds. No hepatosplenomegaly.  MUSCULOSKELETAL: No swelling, clubbing, or edema. Range of motion full in all extremities.  NEUROLOGIC: Cranial nerves II through XII are intact. No gross focal neurological deficits. Sensation intact. Reflexes intact.  SKIN: No ulceration, lesions, rashes, or cyanosis. Skin warm and dry. Turgor intact.  PSYCHIATRIC: Mood, affect within normal limits. The patient is awake, alert and oriented x 3. Insight, judgment intact.    LABORATORY PANEL:   CBC  Recent Labs Lab 08/23/14 2310  WBC 14.0*  HGB 15.3  HCT 48.1  PLT 189   ------------------------------------------------------------------------------------------------------------------  Chemistries   Recent  Labs Lab 08/23/14 2310  NA 140  K 4.8  CL 96*  CO2 35*  GLUCOSE 112*  BUN 23*  CREATININE 1.37*  CALCIUM 8.6*   ------------------------------------------------------------------------------------------------------------------  Cardiac Enzymes  Recent Labs Lab 08/23/14 2310  TROPONINI 0.04*   ------------------------------------------------------------------------------------------------------------------  RADIOLOGY:  Dg Chest 2 View  08/24/2014   CLINICAL DATA:  Lower extremity swelling, recent hospital admission for CHF. History of hypertension, COPD, sarcoidosis.  EXAM: CHEST  2 VIEW  COMPARISON:  Chest radiograph August 14, 2014 and CT chest August 15, 2014   FINDINGS: Cardiomediastinal silhouette is unremarkable. Persistently elevated LEFT hemidiaphragm with pleural thickening, and bilateral lung scarring. LEFT lung brace granulomas for present previously. Trachea projects midline and there is no pneumothorax. Soft tissue planes and included osseous structure nonsuspicious.  IMPRESSION: No acute cardiopulmonary process.  Bilateral lung scarring, LEFT lung base pleural thickening.   Electronically Signed   By: Elon Alas M.D.   On: 08/24/2014 00:53    EKG:   Orders placed or performed during the hospital encounter of 08/24/14  . ED EKG within 10 minutes  . ED EKG within 10 minutes    IMPRESSION AND PLAN:   44 year old African American gentleman history of COPD and non-auction dependent obstructive sleep apnea requiring CPAP therapy coronary artery disease presenting with lower extremity edema.  1. Acute on chronic respiratory failure with hypoxia: Oxygen saturations down to 85 on room air with inability to speak in full sentences upon ambulating, likely secondary to acute on chronic diastolic congestive heart failure. Place on telemetry trend cardiac enzymes and follow ins and outs daily weights, diuresis with Lasix 2. Type 2 diabetes, insulin requiring: Continue basal insulin and sliding scale hold oral agents 3. COPD, unspecified: Continue Advair, Spiriva, some O2 as required, DuoNeb treatments 4. Hyperlipidemia unspecified: Statin therapy 5. Venous thromboembolism prophylactic: Heparin subcutaneous   All the records are reviewed and case discussed with ED provider. Management plans discussed with the patient, family and they are in agreement.  CODE STATUS: Full  TOTAL TIME TAKING CARE OF THIS PATIENT: 35 minutes.    Tyrique Sporn,  Karenann Cai.D on 08/24/2014 at 3:09 AM  Between 7am to 6pm - Pager - [phone]  After 6pm: House Pager: - [phone]  [facility]  Office  [phone]  CC: Primary care physician;  Megan Salon

## 2014-08-24 NOTE — ED Notes (Signed)
Sandwich tray and graham crackers provided per patient request.

## 2014-08-24 NOTE — Progress Notes (Signed)
[place] at [facility]                                                                                                                                                                                            Patient Demographics   [name], is a 44 y.o. male, DOB - [birth year], ZOX:[id number]  Admit date - 08/24/2014   Admitting Physician Lytle Butte, MD  Outpatient Primary MD for the patient is Pcp Not In System   LOS - 0  Subjective: She and admitted with shortness of breath and lower extremity swelling. He reports that he received Lasix and has diuresis   Review of Systems:   CONSTITUTIONAL: No documented fever. No fatigue, weakness. No weight gain, no weight loss.  EYES: No blurry or double vision.  ENT: No tinnitus. No postnasal drip. No redness of the oropharynx.  RESPIRATORY: No cough, no wheeze, no hemoptysis. No dyspnea.  CARDIOVASCULAR: No chest pain. Positive orthopnea. No palpitations. No syncope.  GASTROINTESTINAL: No nausea, no vomiting or diarrhea. No abdominal pain. No melena or hematochezia.  GENITOURINARY: No dysuria or hematuria.  ENDOCRINE: No polyuria or nocturia. No heat or cold intolerance.  HEMATOLOGY: No anemia. No bruising. No bleeding.  INTEGUMENTARY: No rashes. No lesions. Swelling of the lower extremity MUSCULOSKELETAL: No arthritis. No swelling. No gout.  NEUROLOGIC: No numbness, tingling, or ataxia. No seizure-type activity.  PSYCHIATRIC: No anxiety. No insomnia. No ADD.    Vitals:   Filed Vitals:   08/24/14 0432 08/24/14 0500 08/24/14 0733 08/24/14 1100  BP: 154/95   138/76  Pulse: 86   78  Temp: 97.9 F (36.6 C)   98.6 F (37 C)  TempSrc: Oral   Oral  Resp: 21   20  Height:      Weight:  137.485 kg (303 lb 1.6 oz)    SpO2: 98%  95% 99%    Wt Readings from Last 3 Encounters:  08/24/14 137.485 kg (303 lb 1.6 oz)  08/14/14 127.007 kg (280 lb)  08/11/14 127.007 kg (280 lb)     Intake/Output  Summary (Last 24 hours) at 08/24/14 1238 Last data filed at 08/24/14 1039  Gross per 24 hour  Intake    363 ml  Output   2600 ml  Net  -2237 ml    Physical Exam:   GENERAL: Pleasant-appearing in no apparent distress.  HEAD, EYES, EARS, NOSE AND THROAT: Atraumatic, normocephalic. Extraocular muscles are intact. Pupils equal and reactive to light. Sclerae anicteric. No conjunctival injection. No oro-pharyngeal erythema.  NECK: Supple. There is no jugular venous distention. No bruits, no lymphadenopathy,  no thyromegaly.  HEART: Regular rate and rhythm, tachycardic. No murmurs, no rubs, no clicks.  LUNGS: Clear to auscultation bilaterally. No rales or rhonchi. No wheezes.  ABDOMEN: Soft, flat, nontender, nondistended. Has good bowel sounds. No hepatosplenomegaly appreciated.  EXTREMITIES: No evidence of any cyanosis, clubbing, or 2+ pedal edema NEUROLOGIC: The patient is alert, awake, and oriented x3 with no focal motor or sensory deficits appreciated bilaterally.  SKIN: Moist and warm with no rashes appreciated.  Psych: Not anxious, depressed LN: No inguinal LN enlargement    Antibiotics   Anti-infectives    None      Medications   Scheduled Meds: . aspirin  81 mg Oral [id number]  . atorvastatin  40 mg Oral QHS  . cloNIDine  0.2 mg Transdermal Weekly  . fentaNYL  25 mcg Transdermal Q72H  . furosemide  40 mg Intravenous BID  . heparin  5,000 Units Subcutaneous 3 times per day  . insulin aspart  0-15 Units Subcutaneous TID WC  . insulin aspart  0-5 Units Subcutaneous QHS  . insulin detemir  40 Units Subcutaneous QHS  . ipratropium-albuterol  3 mL Nebulization Q6H  . isosorbide dinitrate  10 mg Oral TID  . metoprolol  100 mg Oral BID  . mometasone-formoterol  2 puff Inhalation BID  . sodium chloride  3 mL Intravenous Q12H  . tiotropium  18 mcg Inhalation Daily   Continuous Infusions:  PRN Meds:.acetaminophen **OR** acetaminophen, morphine injection   Data Review:   Micro  Results No results found for this or any previous visit (from the past 240 hour(s)).  Radiology Reports Dg Chest 2 View  08/24/2014   CLINICAL DATA:  Lower extremity swelling, recent hospital admission for CHF. History of hypertension, COPD, sarcoidosis.  EXAM: CHEST  2 VIEW  COMPARISON:  Chest radiograph August 14, 2014 and CT chest August 15, 2014  FINDINGS: Cardiomediastinal silhouette is unremarkable. Persistently elevated LEFT hemidiaphragm with pleural thickening, and bilateral lung scarring. LEFT lung brace granulomas for present previously. Trachea projects midline and there is no pneumothorax. Soft tissue planes and included osseous structure nonsuspicious.  IMPRESSION: No acute cardiopulmonary process.  Bilateral lung scarring, LEFT lung base pleural thickening.   Electronically Signed   By: Elon Alas M.D.   On: 08/24/2014 00:53   Ct Angio Chest Pe W/cm &/or Wo Cm  08/15/2014   CLINICAL DATA:  Increasing shortness of breath for 2 days. History of sarcoid, coronary artery disease and hypertension.  EXAM: CT ANGIOGRAPHY CHEST WITH CONTRAST  TECHNIQUE: Multidetector CT imaging of the chest was performed using the standard protocol during bolus administration of intravenous contrast. Multiplanar CT image reconstructions and MIPs were obtained to evaluate the vascular anatomy.  CONTRAST:  153mL OMNIPAQUE IOHEXOL 350 MG/ML SOLN  COMPARISON:  03/03/2013 and prior CTs.  08/14/2014 radiograph  FINDINGS: This is a technically satisfactory study.  Mediastinum/Nodes: No pulmonary emboli are identified. Cardiomegaly is identified. There is no evidence of thoracic aortic aneurysm or dissection. No pericardial effusion or enlarged lymph nodes are present.  Lungs/Pleura: Scattered areas of scarring, atelectasis and mild bronchiectasis again noted. Interstitial prominence is again identified. There is no evidence of airspace disease, consolidation, suspicious nodule, mass or endobronchial/ endotracheal lesion.  There is no evidence of pleural effusion or pneumothorax.  Upper abdomen: Unremarkable  Musculoskeletal: No acute or suspicious abnormalities.  Review of the MIP images confirms the above findings.  IMPRESSION: No evidence of acute abnormality.  No evidence of pulmonary emboli.  Unchanged areas of scarring/atelectasis, bronchiectasis  and interstitial prominence compatible with this patient's history sarcoid.  Cardiomegaly   Electronically Signed   By: Margarette Canada M.D.   On: 08/15/2014 13:56   US Venous Img Lower Unilateral Right  08/14/2014   CLINICAL DATA:  Patient with right lower extremity swelling.  EXAM: RIGHT LOWER EXTREMITY VENOUS DOPPLER ULTRASOUND  TECHNIQUE: Gray-scale sonography with graded compression, as well as color Doppler and duplex ultrasound were performed to evaluate the lower extremity deep venous systems from the level of the common femoral vein and including the common femoral, femoral, profunda femoral, popliteal and calf veins including the posterior tibial, peroneal and gastrocnemius veins when visible. The superficial great saphenous vein was also interrogated. Spectral Doppler was utilized to evaluate flow at rest and with distal augmentation maneuvers in the common femoral, femoral and popliteal veins.  COMPARISON:  None.  FINDINGS: Contralateral Common Femoral Vein: Respiratory phasicity is normal and symmetric with the symptomatic side. No evidence of thrombus. Normal compressibility.  Common Femoral Vein: No evidence of thrombus. Normal compressibility, respiratory phasicity and response to augmentation.  Saphenofemoral Junction: No evidence of thrombus. Normal compressibility and flow on color Doppler imaging.  Profunda Femoral Vein: No evidence of thrombus. Normal compressibility and flow on color Doppler imaging.  Femoral Vein: No evidence of thrombus. Normal compressibility, respiratory phasicity and response to augmentation.  Popliteal Vein: No evidence of thrombus. Normal  compressibility, respiratory phasicity and response to augmentation.  Calf Veins: No evidence of thrombus. Normal compressibility and flow on color Doppler imaging.  Superficial Great Saphenous Vein: No evidence of thrombus. Normal compressibility and flow on color Doppler imaging.  Venous Reflux:  None.  Other Findings:  None.  IMPRESSION: No evidence for DVT within the right lower extremity.   Electronically Signed   By: Lovey Newcomer M.D.   On: 08/14/2014 09:17   Dg Chest Portable 1 View  08/14/2014   CLINICAL DATA:  Chest pain  EXAM: PORTABLE CHEST - 1 VIEW  COMPARISON:  03/09/2013  FINDINGS: There is chronic linear scarring at the right base and in the left upper lobe. Chronic elevation and blunting of the lateral left costophrenic sulcus. No superimposed pneumonia or edema. No effusion or pneumothorax. Normal heart size and mediastinal contours.  IMPRESSION: Stable lung and left pleural scarring. No new finding to suggest acute disease.   Electronically Signed   By: Monte Fantasia M.D.   On: 08/14/2014 05:34     CBC  Recent Labs Lab 08/23/14 2310  WBC 14.0*  HGB 15.3  HCT 48.1  PLT 189  MCV 84.9  MCH 27.1  MCHC 31.9*  RDW 14.9*    Chemistries   Recent Labs Lab 08/23/14 2310  NA 140  K 4.8  CL 96*  CO2 35*  GLUCOSE 112*  BUN 23*  CREATININE 1.37*  CALCIUM 8.6*   ------------------------------------------------------------------------------------------------------------------ estimated creatinine clearance is 98.9 mL/min (by C-G formula based on Cr of 1.37). ------------------------------------------------------------------------------------------------------------------ No results for input(s): HGBA1C in the last 72 hours. ------------------------------------------------------------------------------------------------------------------ No results for input(s): CHOL, HDL, LDLCALC, TRIG, CHOLHDL, LDLDIRECT in the last 72  hours. ------------------------------------------------------------------------------------------------------------------ No results for input(s): TSH, T4TOTAL, T3FREE, THYROIDAB in the last 72 hours.  Invalid input(s): FREET3 ------------------------------------------------------------------------------------------------------------------ No results for input(s): VITAMINB12, FOLATE, FERRITIN, TIBC, IRON, RETICCTPCT in the last 72 hours.  Coagulation profile No results for input(s): INR, PROTIME in the last 168 hours.  No results for input(s): DDIMER in the last 72 hours.  Cardiac Enzymes  Recent Labs Lab 08/23/14 2310 08/24/14 0557  TROPONINI 0.04* 0.03   ------------------------------------------------------------------------------------------------------------------ Invalid input(s): POCBNP    Assessment & Plan   44 year old African American gentleman history of COPD and non-auction dependent obstructive sleep apnea requiring CPAP therapy coronary artery disease presenting with lower extremity edema.  1. Acute on chronic respiratory failure with hypoxia: Likely due to right-sided heart failure had a recent echo which showed normal EF and normal diastolic function. Continue with Lasix IV I will increase the dose to twice a day 2. Type 2 diabetes, insulin requiring: Continue basal insulin and sliding scale hold oral agents 3. COPD, unspecified: Continue Advair, Spiriva, some O2 as required, DuoNeb treatments patient may need oxygen on discharge she was discharged recently on oxygen not clear if he is compliant 4. Hyperlipidemia unspecified: Statin therapy 5. Sleep apnea needs to continue CPAP therapy     Code Status Orders        Start     Ordered   08/24/14 0245  Full code   Continuous     08/24/14 0245           Consults   None   DVT Prophylaxis  Lovenox    Lab Results  Component Value Date   PLT 189 08/23/2014     Time Spent in minutes   24min   Greater than 50% of time spent in care coordination and counseling.   Dustin Flock M.D on 08/24/2014 at 12:38 PM  Between 7am to 6pm - Pager - [phone]  After 6pm go to [email or web address] - password [email or web address] [facility]   Office  [phone]

## 2014-08-24 NOTE — ED Notes (Signed)
Patient with SPO2 of 85% on RA. Patient placed on 2L supplemental oxygen via [place]. Patient requesting humidification. MD made aware and ok to order.

## 2014-08-25 LAB — GLUCOSE, CAPILLARY
Glucose-Capillary: 155 mg/dL — ABNORMAL HIGH (ref 65–99)
Glucose-Capillary: 228 mg/dL — ABNORMAL HIGH (ref 65–99)
Glucose-Capillary: 122 mg/dL — ABNORMAL HIGH (ref 65–99)
Glucose-Capillary: 193 mg/dL — ABNORMAL HIGH (ref 65–99)

## 2014-08-25 LAB — BASIC METABOLIC PANEL
Anion gap: 5 (ref 5–15)
BUN: 22 mg/dL — ABNORMAL HIGH (ref 6–20)
CHLORIDE: 90 mmol/L — AB (ref 101–111)
CO2: 47 mmol/L — AB (ref 22–32)
Calcium: 8.6 mg/dL — ABNORMAL LOW (ref 8.9–10.3)
Creatinine, Ser: 1.41 mg/dL — ABNORMAL HIGH (ref 0.61–1.24)
GFR calc non Af Amer: 59 mL/min — ABNORMAL LOW (ref >60)
GLUCOSE: 152 mg/dL — AB (ref 65–99)
Potassium: 4 mmol/L (ref 3.5–5.1)
Sodium: 142 mmol/L (ref 135–145)

## 2014-08-25 LAB — CBC
HEMATOCRIT: 45.6 % (ref 40.0–52.0)
HEMOGLOBIN: 14.6 g/dL (ref 13.0–18.0)
MCH: 27.2 pg (ref 26.0–34.0)
MCHC: 32 g/dL (ref 32.0–36.0)
MCV: 84.8 fL (ref 80.0–100.0)
Platelets: 180 10*3/uL (ref 150–440)
RBC: 5.37 MIL/uL (ref 4.40–5.90)
RDW: 15 % — ABNORMAL HIGH (ref 11.5–14.5)
WBC: 9.8 10*3/uL (ref 3.8–10.6)

## 2014-08-25 LAB — MAGNESIUM: MAGNESIUM: 2.1 mg/dL (ref 1.7–2.4)

## 2014-08-25 MED ORDER — ROPINIROLE HCL 1 MG PO TABS
0.5000 mg | ORAL_TABLET | Freq: Three times a day (TID) | ORAL | Status: DC | PRN
  Administered 2014-08-25: 0.5 mg via ORAL
  Filled 2014-08-25: qty 1

## 2014-08-25 MED ORDER — FUROSEMIDE 40 MG PO TABS
40.0000 mg | ORAL_TABLET | Freq: Two times a day (BID) | ORAL | Status: DC
  Administered 2014-08-25 – 2014-08-26 (×2): 40 mg via ORAL
  Filled 2014-08-25 (×2): qty 1

## 2014-08-25 MED ORDER — FUROSEMIDE 10 MG/ML IJ SOLN: 40.0000 mg | Freq: Two times a day (BID) | INTRAMUSCULAR | Status: DC

## 2014-08-25 NOTE — Plan of Care (Signed)
Problem: Phase I Progression Outcomes Goal: Other Phase I Outcomes/Goals Outcome: Progressing Pt remains afebrile, BP 120-130/70-80s, HR 70-80s, Pt remains on 1L [place] SpO2 >90%, pt denies and CP and SOB today. Pt received 78mEq IV lasix this AM and 69mEq PO lasix this evening.

## 2014-08-25 NOTE — Progress Notes (Addendum)
[place] at [facility]                                                                                                                                                                                            Patient Demographics   [name], is a 44 y.o. male, DOB - 1970-03-06, KVQ:[id number]  Admit date - 08/24/2014   Admitting Physician Lytle Butte, MD  Outpatient Primary MD for the patient is Pcp Not In System   LOS - 1  Subjective: He continues to complaint of some swelling in his lower extremity but much improved compared to admission. Denies any chest pain complains of leg cramping  Review of Systems:   CONSTITUTIONAL: No documented fever. No fatigue, weakness. No weight gain, no weight loss.  EYES: No blurry or double vision.  ENT: No tinnitus. No postnasal drip. No redness of the oropharynx.  RESPIRATORY: No cough, no wheeze, no hemoptysis. Chronic dyspnea.  CARDIOVASCULAR: No chest pain. Positive orthopnea. No palpitations. No syncope.  GASTROINTESTINAL: No nausea, no vomiting or diarrhea. No abdominal pain. No melena or hematochezia.  GENITOURINARY: No dysuria or hematuria.  ENDOCRINE: No polyuria or nocturia. No heat or cold intolerance.  HEMATOLOGY: No anemia. No bruising. No bleeding.  INTEGUMENTARY: No rashes. No lesions. Swelling of the lower extremity MUSCULOSKELETAL: No arthritis. No swelling. No gout.  NEUROLOGIC: No numbness, tingling, or ataxia. No seizure-type activity.  PSYCHIATRIC: No anxiety. No insomnia. No ADD.    Vitals:   Filed Vitals:   08/25/14 0548 08/25/14 0803 08/25/14 0928 08/25/14 1129  BP: 127/73  128/71 130/84  Pulse: 81  89 84  Temp: 98.9 F (37.2 C)   98.5 F (36.9 C)  TempSrc:    Oral  Resp: 20   20  Height:      Weight:      SpO2: 100% 98%  96%    Wt Readings from Last 3 Encounters:  08/25/14 132.405 kg (291 lb 14.4 oz)  08/14/14 127.007 kg (280 lb)  08/11/14 127.007 kg (280 lb)      Intake/Output Summary (Last 24 hours) at 08/25/14 1135 Last data filed at 08/25/14 0935  Gross per 24 hour  Intake   1103 ml  Output   5625 ml  Net  -4522 ml    Physical Exam:   GENERAL: Pleasant-appearing in no apparent distress.  HEAD, EYES, EARS, NOSE AND THROAT: Atraumatic, normocephalic. Extraocular muscles are intact. Pupils equal and reactive to light. Sclerae anicteric. No conjunctival injection. No oro-pharyngeal erythema.  NECK: Supple. There is no jugular venous distention. No bruits, no lymphadenopathy, no thyromegaly.  HEART: Regular rate and rhythm, tachycardic. No murmurs, no rubs, no clicks.  LUNGS: Clear to auscultation bilaterally. No rales or rhonchi. No wheezes.  ABDOMEN: Soft, flat, nontender, nondistended. Has good bowel sounds. No hepatosplenomegaly appreciated.  EXTREMITIES: No evidence of any cyanosis, clubbing, or1+ pedal edema NEUROLOGIC: The patient is alert, awake, and oriented x3 with no focal motor or sensory deficits appreciated bilaterally.  SKIN: Moist and warm with no rashes appreciated.  Psych: Not anxious, depressed LN: No inguinal LN enlargement    Antibiotics   Anti-infectives    None      Medications   Scheduled Meds: . aspirin  81 mg Oral BH-q7a  . atorvastatin  40 mg Oral QHS  . cloNIDine  0.2 mg Transdermal Weekly  . fentaNYL  25 mcg Transdermal Q72H  . furosemide  40 mg Oral BID  . heparin  5,000 Units Subcutaneous 3 times per day  . insulin aspart  0-15 Units Subcutaneous TID WC  . insulin aspart  0-5 Units Subcutaneous QHS  . insulin detemir  40 Units Subcutaneous QHS  . ipratropium-albuterol  3 mL Nebulization Q6H  . isosorbide dinitrate  10 mg Oral TID  . metoprolol  100 mg Oral BID  . mometasone-formoterol  2 puff Inhalation BID  . sodium chloride  3 mL Intravenous Q12H  . tiotropium  18 mcg Inhalation Daily   Continuous Infusions:  PRN Meds:.acetaminophen **OR** acetaminophen, morphine injection,  rOPINIRole   Data Review:   Micro Results No results found for this or any previous visit (from the past 240 hour(s)).  Radiology Reports Dg Chest 2 View  08/24/2014   CLINICAL DATA:  Lower extremity swelling, recent hospital admission for CHF. History of hypertension, COPD, sarcoidosis.  EXAM: CHEST  2 VIEW  COMPARISON:  Chest radiograph August 14, 2014 and CT chest August 15, 2014  FINDINGS: Cardiomediastinal silhouette is unremarkable. Persistently elevated LEFT hemidiaphragm with pleural thickening, and bilateral lung scarring. LEFT lung brace granulomas for present previously. Trachea projects midline and there is no pneumothorax. Soft tissue planes and included osseous structure nonsuspicious.  IMPRESSION: No acute cardiopulmonary process.  Bilateral lung scarring, LEFT lung base pleural thickening.   Electronically Signed   By: Elon Alas M.D.   On: 08/24/2014 00:53   Ct Angio Chest Pe W/cm &/or Wo Cm  08/15/2014   CLINICAL DATA:  Increasing shortness of breath for 2 days. History of sarcoid, coronary artery disease and hypertension.  EXAM: CT ANGIOGRAPHY CHEST WITH CONTRAST  TECHNIQUE: Multidetector CT imaging of the chest was performed using the standard protocol during bolus administration of intravenous contrast. Multiplanar CT image reconstructions and MIPs were obtained to evaluate the vascular anatomy.  CONTRAST:  156mL OMNIPAQUE IOHEXOL 350 MG/ML SOLN  COMPARISON:  03/03/2013 and prior CTs.  08/14/2014 radiograph  FINDINGS: This is a technically satisfactory study.  Mediastinum/Nodes: No pulmonary emboli are identified. Cardiomegaly is identified. There is no evidence of thoracic aortic aneurysm or dissection. No pericardial effusion or enlarged lymph nodes are present.  Lungs/Pleura: Scattered areas of scarring, atelectasis and mild bronchiectasis again noted. Interstitial prominence is again identified. There is no evidence of airspace disease, consolidation, suspicious nodule, mass  or endobronchial/ endotracheal lesion. There is no evidence of pleural effusion or pneumothorax.  Upper abdomen: Unremarkable  Musculoskeletal: No acute or suspicious abnormalities.  Review of the MIP images confirms the above findings.  IMPRESSION: No evidence of acute abnormality.  No evidence of pulmonary emboli.  Unchanged areas of scarring/atelectasis, bronchiectasis and interstitial prominence  compatible with this patient's history sarcoid.  Cardiomegaly   Electronically Signed   By: Margarette Canada M.D.   On: 08/15/2014 13:56   US Venous Img Lower Unilateral Right  08/14/2014   CLINICAL DATA:  Patient with right lower extremity swelling.  EXAM: RIGHT LOWER EXTREMITY VENOUS DOPPLER ULTRASOUND  TECHNIQUE: Gray-scale sonography with graded compression, as well as color Doppler and duplex ultrasound were performed to evaluate the lower extremity deep venous systems from the level of the common femoral vein and including the common femoral, femoral, profunda femoral, popliteal and calf veins including the posterior tibial, peroneal and gastrocnemius veins when visible. The superficial great saphenous vein was also interrogated. Spectral Doppler was utilized to evaluate flow at rest and with distal augmentation maneuvers in the common femoral, femoral and popliteal veins.  COMPARISON:  None.  FINDINGS: Contralateral Common Femoral Vein: Respiratory phasicity is normal and symmetric with the symptomatic side. No evidence of thrombus. Normal compressibility.  Common Femoral Vein: No evidence of thrombus. Normal compressibility, respiratory phasicity and response to augmentation.  Saphenofemoral Junction: No evidence of thrombus. Normal compressibility and flow on color Doppler imaging.  Profunda Femoral Vein: No evidence of thrombus. Normal compressibility and flow on color Doppler imaging.  Femoral Vein: No evidence of thrombus. Normal compressibility, respiratory phasicity and response to augmentation.  Popliteal  Vein: No evidence of thrombus. Normal compressibility, respiratory phasicity and response to augmentation.  Calf Veins: No evidence of thrombus. Normal compressibility and flow on color Doppler imaging.  Superficial Great Saphenous Vein: No evidence of thrombus. Normal compressibility and flow on color Doppler imaging.  Venous Reflux:  None.  Other Findings:  None.  IMPRESSION: No evidence for DVT within the right lower extremity.   Electronically Signed   By: Lovey Newcomer M.D.   On: 08/14/2014 09:17   Dg Chest Portable 1 View  08/14/2014   CLINICAL DATA:  Chest pain  EXAM: PORTABLE CHEST - 1 VIEW  COMPARISON:  03/09/2013  FINDINGS: There is chronic linear scarring at the right base and in the left upper lobe. Chronic elevation and blunting of the lateral left costophrenic sulcus. No superimposed pneumonia or edema. No effusion or pneumothorax. Normal heart size and mediastinal contours.  IMPRESSION: Stable lung and left pleural scarring. No new finding to suggest acute disease.   Electronically Signed   By: Monte Fantasia M.D.   On: 08/14/2014 05:34     CBC  Recent Labs Lab 08/23/14 2310 08/25/14 0359  WBC 14.0* 9.8  HGB 15.3 14.6  HCT 48.1 45.6  PLT 189 180  MCV 84.9 84.8  MCH 27.1 27.2  MCHC 31.9* 32.0  RDW 14.9* 15.0*    Chemistries   Recent Labs Lab 08/23/14 2310 08/25/14 0359  NA 140 142  K 4.8 4.0  CL 96* 90*  CO2 35* 47*  GLUCOSE 112* 152*  BUN 23* 22*  CREATININE 1.37* 1.41*  CALCIUM 8.6* 8.6*  MG  --  2.1   ------------------------------------------------------------------------------------------------------------------ estimated creatinine clearance is 94.1 mL/min (by C-G formula based on Cr of 1.41). ------------------------------------------------------------------------------------------------------------------ No results for input(s): HGBA1C in the last 72  hours. ------------------------------------------------------------------------------------------------------------------ No results for input(s): CHOL, HDL, LDLCALC, TRIG, CHOLHDL, LDLDIRECT in the last 72 hours. ------------------------------------------------------------------------------------------------------------------ No results for input(s): TSH, T4TOTAL, T3FREE, THYROIDAB in the last 72 hours.  Invalid input(s): FREET3 ------------------------------------------------------------------------------------------------------------------ No results for input(s): VITAMINB12, FOLATE, FERRITIN, TIBC, IRON, RETICCTPCT in the last 72 hours.  Coagulation profile No results for input(s): INR, PROTIME in the last 168 hours.  No results for input(s): DDIMER in the last 72 hours.  Cardiac Enzymes  Recent Labs Lab 08/23/14 2310 08/24/14 0557 08/24/14 1309  TROPONINI 0.04* 0.03 0.04*   ------------------------------------------------------------------------------------------------------------------ Invalid input(s): POCBNP    Assessment & Plan   44 year old African American gentleman history of COPD and non-auction dependent obstructive sleep apnea requiring CPAP therapy coronary artery disease presenting with lower extremity edema.  1. Acute on chronic respiratory failure with hypoxia: Likely due to acute right-sided heart failure had a recent echo which showed normal EF and normal diastolic function.po lasix  2. Type 2 diabetes, insulin requiring: Continue basal insulin and sliding scale hold oral agents 3. COPD, unspecified: Continue Advair, Spiriva, some O2 as required, DuoNeb treatments patient may need oxygen on discharge she was discharged recently on oxygen not clear if he is compliant 4. Hyperlipidemia unspecified: Statin therapy 5. Sleep apnea needs to continue CPAP therapy     Code Status Orders        Start     Ordered   08/24/14 0245  Full code   Continuous      08/24/14 0245           Consults   None   DVT Prophylaxis  Lovenox    Lab Results  Component Value Date   PLT 180 08/25/2014     Time Spent in minutes   64min    Dustin Flock M.D on 08/25/2014 at 11:35 AM  Between 7am to 6pm - Pager - [phone]  After 6pm go to [email or web address] - password [email or web address] [facility]   Office  [phone]

## 2014-08-25 NOTE — Clinical Documentation Improvement (Signed)
08/25/14 progress notes "Acute on chronic respiratory failure with hypoxia likely due to right-sided heart failure; had a recent echo which showed normal EF and normal diastolic function, PO lasix."  Please provide further specificity of the patient's heart failure with acuity and type.  Acuity: acute, chronic, acute on chronic Type: systolic, diastolic, combined systolic and diastolic  Thank you, Mateo Flow, RN [phone] Clinical Documentation Specialist

## 2014-08-26 LAB — BASIC METABOLIC PANEL
Anion gap: 5 (ref 5–15)
BUN: 18 mg/dL (ref 6–20)
CALCIUM: 8 mg/dL — AB (ref 8.9–10.3)
CO2: 38 mmol/L — AB (ref 22–32)
Chloride: 94 mmol/L — ABNORMAL LOW (ref 101–111)
Creatinine, Ser: 1.17 mg/dL (ref 0.61–1.24)
GFR calc Af Amer: >60 mL/min (ref >60)
GFR calc non Af Amer: >60 mL/min (ref >60)
GLUCOSE: 155 mg/dL — AB (ref 65–99)
Potassium: 4.3 mmol/L (ref 3.5–5.1)
SODIUM: 137 mmol/L (ref 135–145)

## 2014-08-26 LAB — GLUCOSE, CAPILLARY
GLUCOSE-CAPILLARY: 160 mg/dL — AB (ref 65–99)
Glucose-Capillary: 160 mg/dL — ABNORMAL HIGH (ref 65–99)

## 2014-08-26 MED ORDER — FUROSEMIDE 40 MG PO TABS: 40.0000 mg | ORAL_TABLET | Freq: Two times a day (BID) | ORAL | Status: DC

## 2014-08-26 NOTE — Progress Notes (Signed)
Inpatient Diabetes Program Recommendations  AACE/ADA: New Consensus Statement on Inpatient Glycemic Control (2013)  Target Ranges:  Prepandial:   less than 140 mg/dL      Peak postprandial:   less than 180 mg/dL (1-2 hours)      Critically ill patients:  140 - 180 mg/dL   Results for LAUREL, SMELTZ (MRN [id number]) as of 08/26/2014 10:44  Ref. Range 08/25/2014 07:27 08/25/2014 11:08 08/25/2014 16:44 08/25/2014 20:02 08/26/2014 07:37  Glucose-Capillary Latest Ref Range: 65-99 mg/dL 155 (H) 228 (H) 122 (H) 193 (H) 160 (H)    Diabetes history: DM2 Outpatient Diabetes medications: Humalog 15-25 units QPM with supper, Levemir 40 units QHS, Metformin 500 mg BID Current orders for Inpatient glycemic control: Levemir 40 units QHS, Novolog 0-15 units TID with meals, Novolog 0-5 units HS  Inpatient Diabetes Program Recommendations Insulin - Meal Coverage: If patient is eating at least 50% of meals, please consider ordering Novolog 3 units TID with meals for meal coverage (in addition to Novolog correction).  Thanks, Barnie Alderman, RN, MSN, CCRN, CDE Diabetes Coordinator Inpatient Diabetes Program [phone] (Team Pager from [place] to [place]) [phone] ([place] office) [phone] [facility] office) [phone] [facility] office)

## 2014-08-26 NOTE — Discharge Summary (Signed)
[name], 44 y.o., DOB 1970-11-28, MRN [id number]. 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 male with a known history of COPD, hypertension, diabetes, hypercholesterol, coronary artery disease as complain of shortness of breath which is getting worse and constant for last 2 days gradually worsening. Patient also had lower extremity swelling. Patient came to the ER had a chest x-ray which was negative but he has significant swelling of his lower extremity. Patient was admitted and placed on Lasix. His chest x-ray showed no evidence of CHF. His recent echo did show that he has right ventricular cavitary this mildly dilated in the right atrium was mildly dilated. All suggestive of right-sided heart failure. Patient was treated with IV Lasix with resolution of his lower extremity swelling. He is doing much better and is stable for discharge            Consults  None  Significant Tests:  See full reports for all details    Dg Chest 2 View  08/24/2014   CLINICAL DATA:  Lower extremity swelling, recent hospital admission for CHF. History of hypertension, COPD, sarcoidosis.  EXAM: CHEST  2 VIEW  COMPARISON:  Chest radiograph August 14, 2014 and CT chest August 15, 2014  FINDINGS: Cardiomediastinal silhouette is unremarkable. Persistently elevated LEFT hemidiaphragm with pleural thickening, and bilateral lung scarring. LEFT lung brace granulomas for present previously. Trachea projects midline and there is no pneumothorax. Soft tissue planes and included osseous  structure nonsuspicious.  IMPRESSION: No acute cardiopulmonary process.  Bilateral lung scarring, LEFT lung base pleural thickening.   Electronically Signed   By: Elon Alas M.D.   On: 08/24/2014 00:53   Ct Angio Chest Pe W/cm &/or Wo Cm  08/15/2014   CLINICAL DATA:  Increasing shortness of breath for 2 days. History of sarcoid, coronary artery disease and hypertension.  EXAM: CT ANGIOGRAPHY CHEST WITH CONTRAST  TECHNIQUE: Multidetector CT imaging of the chest was performed using the standard protocol during bolus administration of intravenous contrast. Multiplanar CT image reconstructions and MIPs were obtained to evaluate the vascular anatomy.  CONTRAST:  127mL OMNIPAQUE IOHEXOL 350 MG/ML SOLN  COMPARISON:  03/03/2013 and prior CTs.  08/14/2014 radiograph  FINDINGS: This is a technically satisfactory study.  Mediastinum/Nodes: No pulmonary emboli are identified. Cardiomegaly is identified. There is no evidence of thoracic aortic aneurysm or dissection. No pericardial effusion or enlarged lymph nodes are present.  Lungs/Pleura: Scattered areas of scarring, atelectasis and mild bronchiectasis again noted. Interstitial prominence is again identified. There is no evidence of airspace disease, consolidation, suspicious nodule, mass or endobronchial/ endotracheal lesion. There is no evidence of pleural effusion or pneumothorax.  Upper abdomen: Unremarkable  Musculoskeletal: No acute or suspicious abnormalities.  Review of the MIP images confirms the above findings.  IMPRESSION: No evidence of acute abnormality.  No evidence of pulmonary emboli.  Unchanged areas of scarring/atelectasis, bronchiectasis and interstitial prominence compatible with this patient's history sarcoid.  Cardiomegaly   Electronically Signed   By: Margarette Canada M.D.   On: 08/15/2014 13:56   US Venous Img Lower Unilateral Right  08/14/2014  CLINICAL DATA:  Patient with right lower extremity swelling.  EXAM: RIGHT LOWER EXTREMITY VENOUS  DOPPLER ULTRASOUND  TECHNIQUE: Gray-scale sonography with graded compression, as well as color Doppler and duplex ultrasound were performed to evaluate the lower extremity deep venous systems from the level of the common femoral vein and including the common femoral, femoral, profunda femoral, popliteal and calf veins including the posterior tibial, peroneal and gastrocnemius veins when visible. The superficial great saphenous vein was also interrogated. Spectral Doppler was utilized to evaluate flow at rest and with distal augmentation maneuvers in the common femoral, femoral and popliteal veins.  COMPARISON:  None.  FINDINGS: Contralateral Common Femoral Vein: Respiratory phasicity is normal and symmetric with the symptomatic side. No evidence of thrombus. Normal compressibility.  Common Femoral Vein: No evidence of thrombus. Normal compressibility, respiratory phasicity and response to augmentation.  Saphenofemoral Junction: No evidence of thrombus. Normal compressibility and flow on color Doppler imaging.  Profunda Femoral Vein: No evidence of thrombus. Normal compressibility and flow on color Doppler imaging.  Femoral Vein: No evidence of thrombus. Normal compressibility, respiratory phasicity and response to augmentation.  Popliteal Vein: No evidence of thrombus. Normal compressibility, respiratory phasicity and response to augmentation.  Calf Veins: No evidence of thrombus. Normal compressibility and flow on color Doppler imaging.  Superficial Great Saphenous Vein: No evidence of thrombus. Normal compressibility and flow on color Doppler imaging.  Venous Reflux:  None.  Other Findings:  None.  IMPRESSION: No evidence for DVT within the right lower extremity.   Electronically Signed   By: Lovey Newcomer M.D.   On: 08/14/2014 09:17   Dg Chest Portable 1 View  08/14/2014   CLINICAL DATA:  Chest pain  EXAM: PORTABLE CHEST - 1 VIEW  COMPARISON:  03/09/2013  FINDINGS: There is chronic linear scarring at the right  base and in the left upper lobe. Chronic elevation and blunting of the lateral left costophrenic sulcus. No superimposed pneumonia or edema. No effusion or pneumothorax. Normal heart size and mediastinal contours.  IMPRESSION: Stable lung and left pleural scarring. No new finding to suggest acute disease.   Electronically Signed   By: Monte Fantasia M.D.   On: 08/14/2014 05:34       Today   Subjective:   Sayf Kerner feels much better denies any complaints  Objective:   Blood pressure 114/74, pulse 76, temperature 98.5 F (36.9 C), temperature source Oral, resp. rate 18, height 6' (1.829 m), weight 132.405 kg (291 lb 14.4 oz), SpO2 99 %.  .  Intake/Output Summary (Last 24 hours) at 08/26/14 1204 Last data filed at 08/26/14 0841  Gross per 24 hour  Intake    723 ml  Output   1675 ml  Net   -952 ml    Exam VITAL SIGNS: Blood pressure 114/74, pulse 76, temperature 98.5 F (36.9 C), temperature source Oral, resp. rate 18, height 6' (1.829 m), weight 132.405 kg (291 lb 14.4 oz), SpO2 99 %.  GENERAL:  44 y.o.-year-old patient lying in the bed with no acute distress.  EYES: Pupils equal, round, reactive to light and accommodation. No scleral icterus. Extraocular muscles intact.  HEENT: Head atraumatic, normocephalic. Oropharynx and nasopharynx clear.  NECK:  Supple, no jugular venous distention. No thyroid enlargement, no tenderness.  LUNGS: Normal breath sounds bilaterally, no wheezing, rales,rhonchi or crepitation. No use of accessory muscles of respiration.  CARDIOVASCULAR: S1, S2 normal. No murmurs, rubs, or gallops.  ABDOMEN: Soft, nontender, nondistended. Bowel sounds present. No organomegaly or mass.  EXTREMITIES: No pedal edema, cyanosis, or clubbing.  NEUROLOGIC: Cranial nerves II through XII are intact. Muscle strength 5/5 in all extremities. Sensation intact. Gait not checked.  PSYCHIATRIC: The patient is alert and oriented x 3.  SKIN: No obvious rash, lesion, or ulcer.    Data Review     CBC w Diff: Lab Results  Component Value Date   WBC 9.8 08/25/2014   WBC 9.3 05/28/2014   HGB 14.6 08/25/2014   HGB 15.1 05/28/2014   HCT 45.6 08/25/2014   HCT 45.2 05/28/2014   PLT 180 08/25/2014   PLT 185 05/28/2014   LYMPHOPCT 18.6 05/28/2014   MONOPCT 15.8 05/28/2014   EOSPCT 1.3 05/28/2014   BASOPCT 0.3 05/28/2014   CMP: Lab Results  Component Value Date   NA 137 08/26/2014   NA 140 05/28/2014   K 4.3 08/26/2014   K 3.9 05/28/2014   CL 94* 08/26/2014   CL 105 05/28/2014   CO2 38* 08/26/2014   CO2 30 05/28/2014   BUN 18 08/26/2014   BUN 12 05/28/2014   CREATININE 1.17 08/26/2014   CREATININE 1.20 05/28/2014   PROT 7.6 08/14/2014   PROT 8.4* 06/01/2013   ALBUMIN 3.7 08/14/2014   ALBUMIN 3.8 06/01/2013   BILITOT 0.5 08/14/2014   BILITOT 0.7 06/01/2013   ALKPHOS 79 08/14/2014   ALKPHOS 88 06/01/2013   AST 24 08/14/2014   AST 24 06/01/2013   ALT 23 08/14/2014   ALT 54 06/01/2013  .  Micro Results No results found for this or any previous visit (from the past 240 hour(s)).      Code Status Orders        Start     Ordered   08/24/14 0245  Full code   Continuous     08/24/14 0245          Follow-up Information    Follow up with Wallene Huh, MD. Go on 08/30/2014.   Specialty:  Specialist   Why:  Time:11:30 a.m.   Contact information:   [place] [address] [phone]       Follow up with [facility]. Go on 09/03/2014.   Why:  Time:10:00 a.m.   Contact information:   [place] [address] [phone]       Discharge Medications     Medication List    TAKE these medications        albuterol 108 (90 BASE) MCG/ACT inhaler  Commonly known as:  PROVENTIL HFA;VENTOLIN HFA  Inhale 1-2 puffs into the lungs every 6 (six) hours as needed for wheezing or shortness of breath.     aspirin 81 MG chewable tablet  Chew 81 mg by mouth every morning.      atorvastatin 40 MG tablet  Commonly known as:  LIPITOR  Take 40 mg by mouth at bedtime.     cloNIDine 0.2 mg/24hr patch  Commonly known as:  CATAPRES - Dosed in mg/24 hr  Place 1 patch onto the skin once a week. On Monday     fentaNYL 25 MCG/HR patch  Commonly known as:  DURAGESIC - dosed mcg/hr  Apply one patch every 72 hours. Remove old patch. External Use only. Rotate sites. Check placement of patch every shift     Fluticasone Propionate (Inhal) 100 MCG/BLIST Aepb  Commonly known as:  FLOVENT DISKUS  Inhale 2 puffs into the lungs 2 (two) times daily.     Fluticasone-Salmeterol 250-50 MCG/DOSE Aepb  Commonly known as:  ADVAIR  Inhale 1 puff into the lungs 2 (two) times daily.     furosemide 40 MG tablet  Commonly known as:  LASIX  Take 1 tablet (40 mg total) by mouth 2 (two) times daily.     HUMALOG KWIKPEN 100 UNIT/ML KiwkPen  Generic drug:  insulin lispro  Inject 15-25 Units into the skin every evening. At dinnertime     ibuprofen 800 MG tablet  Commonly known as:  ADVIL,MOTRIN  Take 1 tablet (800 mg total) by mouth every 8 (eight) hours as needed for moderate pain.     insulin detemir 100 UNIT/ML injection  Commonly known as:  LEVEMIR  Inject 40 Units into the skin at bedtime.     isosorbide dinitrate 10 MG tablet  Commonly known as:  ISORDIL  Take 10 mg by mouth 3 (three) times daily as needed. For leg cramps     metFORMIN 500 MG tablet  Commonly known as:  GLUCOPHAGE  Take 500 mg by mouth 2 (two) times daily with a meal.     metoprolol 100 MG tablet  Commonly known as:  LOPRESSOR  Take 100 mg by mouth 2 (two) times daily.     predniSONE 10 MG tablet  Commonly known as:  DELTASONE  Take 6 tabs first day, 5 tab on day 2, then 4 on day 3rd, 3 tabs on day 4th , 2 tab on day 5th, and 1 tab on 6th day.     SPIRIVA HANDIHALER 18 MCG inhalation capsule  Generic drug:  tiotropium  Place 18 mcg into inhaler and inhale daily.     traMADol 50 MG tablet  Commonly  known as:  ULTRAM  Take 1 tablet (50 mg total) by mouth every 6 (six) hours as needed.           Total Time in preparing paper work, data evaluation and todays exam - 35 minutes  Dustin Flock M.D on 08/26/2014 at 12:04 [facility]  [facility]   Office  [phone]

## 2014-08-26 NOTE — Care Management (Signed)
Patient is for discahrge home today and is in agreement with home health nursing. Referral called to [facility].  Patient is current and followed by [facility].  His physician Dr Rogue Jury has been transferred to another clinic so patient does not know who has been assign patient's case load.  Discussed home bound criteria, the need to comply with cpap and all physicians orders.  Discussed right heart failure. Patient verbalizes now he realizes that he would benefit from compliance with the cpap.

## 2014-08-26 NOTE — Discharge Instructions (Signed)
°  DIET:  Cardiac diet , carbohydrate consistent diet  DISCHARGE CONDITION:  Stable  ACTIVITY:  Activity as tolerated  OXYGEN:  Home Oxygen: Yes.     Oxygen Delivery: 2 liters/min via Patient connected to nasal cannula oxygen  DISCHARGE LOCATION:  home    ADDITIONAL DISCHARGE INSTRUCTION: must use o2 all time and cpap at bedtime   If you experience worsening of your admission symptoms, develop shortness of breath, life threatening emergency, suicidal or homicidal thoughts you must seek medical attention immediately by calling 911 or calling your MD immediately  if symptoms less severe.  You Must read complete instructions/literature along with all the possible adverse reactions/side effects for all the Medicines you take and that have been prescribed to you. Take any new Medicines after you have completely understood and accpet all the possible adverse reactions/side effects.   Please note  You were cared for by a hospitalist during your hospital stay. If you have any questions about your discharge medications or the care you received while you were in the hospital after you are discharged, you can call the unit and asked to speak with the hospitalist on call if the hospitalist that took care of you is not available. Once you are discharged, your primary care physician will handle any further medical issues. Please note that NO REFILLS for any discharge medications will be authorized once you are discharged, as it is imperative that you return to your primary care physician (or establish a relationship with a primary care physician if you do not have one) for your aftercare needs so that they can reassess your need for medications and monitor your lab values.   [facility] appointment at September 08, 2014 at 11:00am with Darylene Price, [place]. Please call [phone] to reschedule.    [place]

## 2014-08-26 NOTE — Care Management Important Message (Signed)
Important Message  Patient Details  Name: DONAVEN CRISWELL MRN: [id number] Date of Birth: 1971-02-05   Medicare Important Message Given:  Yes-second notification given    Juliann Pulse A Allmond 08/26/2014, 9:27 AM

## 2014-09-08 ENCOUNTER — Ambulatory Visit: Payer: Medicare Other | Admitting: Family

## 2014-09-09 ENCOUNTER — Telehealth: Payer: Self-pay | Admitting: Family

## 2014-09-09 NOTE — Telephone Encounter (Signed)
Patient was a no show for his initial appointment on 09/08/14. Have left message for patient to call back and reschedule.

## 2014-10-01 ENCOUNTER — Telehealth: Payer: Self-pay

## 2014-10-01 NOTE — Telephone Encounter (Signed)
Patient states he forgot about his previous appointment on 8/5 and is willing to r/s. An appointment was made for 10/18/14 at 1 pm.

## 2014-10-18 ENCOUNTER — Encounter: Payer: Self-pay | Admitting: Family

## 2014-10-18 ENCOUNTER — Ambulatory Visit: Payer: Medicare Other | Attending: Family | Admitting: Family

## 2014-10-18 VITALS — BP 146/86 | HR 76 | Resp 20 | Ht 72.0 in | Wt 291.0 lb

## 2014-10-18 DIAGNOSIS — Z794 Long term (current) use of insulin: Secondary | ICD-10-CM | POA: Diagnosis not present

## 2014-10-18 DIAGNOSIS — I5032 Chronic diastolic (congestive) heart failure: Secondary | ICD-10-CM | POA: Diagnosis present

## 2014-10-18 DIAGNOSIS — Z8673 Personal history of transient ischemic attack (TIA), and cerebral infarction without residual deficits: Secondary | ICD-10-CM | POA: Insufficient documentation

## 2014-10-18 DIAGNOSIS — I5042 Chronic combined systolic (congestive) and diastolic (congestive) heart failure: Secondary | ICD-10-CM | POA: Insufficient documentation

## 2014-10-18 DIAGNOSIS — I10 Essential (primary) hypertension: Secondary | ICD-10-CM | POA: Diagnosis not present

## 2014-10-18 DIAGNOSIS — G4730 Sleep apnea, unspecified: Secondary | ICD-10-CM | POA: Insufficient documentation

## 2014-10-18 DIAGNOSIS — E780 Pure hypercholesterolemia: Secondary | ICD-10-CM | POA: Diagnosis not present

## 2014-10-18 DIAGNOSIS — D869 Sarcoidosis, unspecified: Secondary | ICD-10-CM | POA: Insufficient documentation

## 2014-10-18 DIAGNOSIS — Z79899 Other long term (current) drug therapy: Secondary | ICD-10-CM | POA: Diagnosis not present

## 2014-10-18 DIAGNOSIS — Z7982 Long term (current) use of aspirin: Secondary | ICD-10-CM | POA: Insufficient documentation

## 2014-10-18 DIAGNOSIS — J410 Simple chronic bronchitis: Secondary | ICD-10-CM

## 2014-10-18 DIAGNOSIS — Z720 Tobacco use: Secondary | ICD-10-CM

## 2014-10-18 DIAGNOSIS — E119 Type 2 diabetes mellitus without complications: Secondary | ICD-10-CM

## 2014-10-18 DIAGNOSIS — J449 Chronic obstructive pulmonary disease, unspecified: Secondary | ICD-10-CM | POA: Insufficient documentation

## 2014-10-18 DIAGNOSIS — I5022 Chronic systolic (congestive) heart failure: Secondary | ICD-10-CM | POA: Insufficient documentation

## 2014-10-18 DIAGNOSIS — F17210 Nicotine dependence, cigarettes, uncomplicated: Secondary | ICD-10-CM | POA: Diagnosis not present

## 2014-10-18 DIAGNOSIS — I2510 Atherosclerotic heart disease of native coronary artery without angina pectoris: Secondary | ICD-10-CM | POA: Insufficient documentation

## 2014-10-18 NOTE — Patient Instructions (Signed)
Continue weighing daily and call for an overnight weight gain of > 2 pounds or a weekly weight gain of >5 pounds.    Smoking Cessation Quitting smoking is important to your health and has many advantages. However, it is not always easy to quit since nicotine is a very addictive drug. Oftentimes, people try 3 times or more before being able to quit. This document explains the best ways for you to prepare to quit smoking. Quitting takes hard work and a lot of effort, but you can do it. ADVANTAGES OF QUITTING SMOKING  You will live longer, feel better, and live better.  Your body will feel the impact of quitting smoking almost immediately.  Within 20 minutes, blood pressure decreases. Your pulse returns to its normal level.  After 8 hours, carbon monoxide levels in the blood return to normal. Your oxygen level increases.  After 24 hours, the chance of having a heart attack starts to decrease. Your breath, hair, and body stop smelling like smoke.  After 48 hours, damaged nerve endings begin to recover. Your sense of taste and smell improve.  After 72 hours, the body is virtually free of nicotine. Your bronchial tubes relax and breathing becomes easier.  After 2 to 12 weeks, lungs can hold more air. Exercise becomes easier and circulation improves.  The risk of having a heart attack, stroke, cancer, or lung disease is greatly reduced.  After 1 year, the risk of coronary heart disease is cut in half.  After 5 years, the risk of stroke falls to the same as a nonsmoker.  After 10 years, the risk of lung cancer is cut in half and the risk of other cancers decreases significantly.  After 15 years, the risk of coronary heart disease drops, usually to the level of a nonsmoker.  If you are pregnant, quitting smoking will improve your chances of having a healthy baby.  The people you live with, especially any children, will be healthier.  You will have extra money to spend on things other  than cigarettes. QUESTIONS TO THINK ABOUT BEFORE ATTEMPTING TO QUIT You may want to talk about your answers with your health care provider.  Why do you want to quit?  If you tried to quit in the past, what helped and what did not?  What will be the most difficult situations for you after you quit? How will you plan to handle them?  Who can help you through the tough times? Your family? Friends? A health care provider?  What pleasures do you get from smoking? What ways can you still get pleasure if you quit? Here are some questions to ask your health care provider:  How can you help me to be successful at quitting?  What medicine do you think would be best for me and how should I take it?  What should I do if I need more help?  What is smoking withdrawal like? How can I get information on withdrawal? GET READY  Set a quit date.  Change your environment by getting rid of all cigarettes, ashtrays, matches, and lighters in your home, car, or work. Do not let people smoke in your home.  Review your past attempts to quit. Think about what worked and what did not. GET SUPPORT AND ENCOURAGEMENT You have a better chance of being successful if you have help. You can get support in many ways.  Tell your family, friends, and coworkers that you are going to quit and need their support. Ask   them not to smoke around you.  Get individual, group, or telephone counseling and support. Programs are available at local hospitals and health centers. Call your local health department for information about programs in your area.  Spiritual beliefs and practices may help some smokers quit.  Download a "quit meter" on your computer to keep track of quit statistics, such as how long you have gone without smoking, cigarettes not smoked, and money saved.  Get a self-help book about quitting smoking and staying off tobacco. LEARN NEW SKILLS AND BEHAVIORS  Distract yourself from urges to smoke. Talk to  someone, go for a walk, or occupy your time with a task.  Change your normal routine. Take a different route to work. Drink tea instead of coffee. Eat breakfast in a different place.  Reduce your stress. Take a hot bath, exercise, or read a book.  Plan something enjoyable to do every day. Reward yourself for not smoking.  Explore interactive web-based programs that specialize in helping you quit. GET MEDICINE AND USE IT CORRECTLY Medicines can help you stop smoking and decrease the urge to smoke. Combining medicine with the above behavioral methods and support can greatly increase your chances of successfully quitting smoking.  Nicotine replacement therapy helps deliver nicotine to your body without the negative effects and risks of smoking. Nicotine replacement therapy includes nicotine gum, lozenges, inhalers, nasal sprays, and skin patches. Some may be available over-the-counter and others require a prescription.  Antidepressant medicine helps people abstain from smoking, but how this works is unknown. This medicine is available by prescription.  Nicotinic receptor partial agonist medicine simulates the effect of nicotine in your brain. This medicine is available by prescription. Ask your health care provider for advice about which medicines to use and how to use them based on your health history. Your health care provider will tell you what side effects to look out for if you choose to be on a medicine or therapy. Carefully read the information on the package. Do not use any other product containing nicotine while using a nicotine replacement product.  RELAPSE OR DIFFICULT SITUATIONS Most relapses occur within the first 3 months after quitting. Do not be discouraged if you start smoking again. Remember, most people try several times before finally quitting. You may have symptoms of withdrawal because your body is used to nicotine. You may crave cigarettes, be irritable, feel very hungry, cough  often, get headaches, or have difficulty concentrating. The withdrawal symptoms are only temporary. They are strongest when you first quit, but they will go away within 10-14 days. To reduce the chances of relapse, try to:  Avoid drinking alcohol. Drinking lowers your chances of successfully quitting.  Reduce the amount of caffeine you consume. Once you quit smoking, the amount of caffeine in your body increases and can give you symptoms, such as a rapid heartbeat, sweating, and anxiety.  Avoid smokers because they can make you want to smoke.  Do not let weight gain distract you. Many smokers will gain weight when they quit, usually less than 10 pounds. Eat a healthy diet and stay active. You can always lose the weight gained after you quit.  Find ways to improve your mood other than smoking. FOR MORE INFORMATION  www.smokefree.gov  Document Released: 01/16/2001 Document Revised: 06/08/2013 Document Reviewed: 05/03/2011 [employer] Patient Information 2015 [employer]. This information is not intended to replace advice given to you by your health care provider. Make sure you discuss any questions you have with your   health care provider.  

## 2014-10-18 NOTE — Progress Notes (Signed)
Subjective:    Patient ID: [name], male    DOB: 01-20-1971, 44 y.o.   MRN: [id number]  Congestive Heart Failure Presents for initial visit. Associated symptoms include fatigue and shortness of breath (with exertion). Pertinent negatives include no abdominal pain, chest pain, chest pressure, edema, orthopnea, palpitations or paroxysmal nocturnal dyspnea. The symptoms have been stable. Past treatments include beta blockers and salt and fluid restriction. The treatment provided moderate relief. Compliance with prior treatments has been good. His past medical history is significant for CAD, chronic lung disease, DM and HTN. He has one 1st degree relative with heart disease. Compliance with total regimen is 76-100%.  Other This is a chronic (fatigue) problem. The current episode started more than 1 month ago. The problem occurs daily. The problem has been unchanged. Associated symptoms include congestion, coughing and fatigue. Pertinent negatives include no abdominal pain, chest pain, headaches, neck pain, sore throat, vertigo or weakness. The symptoms are aggravated by exertion. He has tried sleep for the symptoms. The treatment provided mild relief.    Past Medical History  Diagnosis Date  . Hypertension   . Diabetes mellitus without complication   . COPD (chronic obstructive pulmonary disease)   . Hypercholesteremia unk  . Coronary artery disease   . Stroke   . Sleep apnea     does not use CPAP regularly.  . Sarcoidosis    Past Surgical History  Procedure Laterality Date  . Appendectomy      Family History  Problem Relation Age of Onset  . Hypertension Mother   . Heart disease Mother   . Diabetes Mother   . Cancer Father    Social History  Substance Use Topics  . Smoking status: Current Every Day Smoker -- 0.33 packs/day    Types: Cigarettes  . Smokeless tobacco: Never Used  . Alcohol Use: 0.0 oz/week    0 Standard drinks or equivalent per week     Comment:  occassional    Allergies  Allergen Reactions  . Penicillins Anaphylaxis    Prior to Admission medications   Medication Sig Start Date End Date Taking? Authorizing Provider  albuterol (PROVENTIL HFA;VENTOLIN HFA) 108 (90 BASE) MCG/ACT inhaler Inhale 1-2 puffs into the lungs every 6 (six) hours as needed for wheezing or shortness of breath.    Yes Historical Provider, MD  aspirin 81 MG chewable tablet Chew 81 mg by mouth every morning.   Yes Historical Provider, MD  atorvastatin (LIPITOR) 40 MG tablet Take 40 mg by mouth at bedtime.    Yes Historical Provider, MD  cloNIDine (CATAPRES - DOSED IN MG/24 HR) 0.2 mg/24hr patch Place 1 patch onto the skin once a week. On Monday 08/07/14  Yes Historical Provider, MD  Fluticasone-Salmeterol (ADVAIR) 250-50 MCG/DOSE AEPB Inhale 1 puff into the lungs 2 (two) times daily.   Yes Historical Provider, MD  furosemide (LASIX) 40 MG tablet Take 1 tablet (40 mg total) by mouth 2 (two) times daily. 08/26/14  Yes Dustin Flock, MD  HUMALOG KWIKPEN 100 UNIT/ML KiwkPen Inject 15-25 Units into the skin every evening. At dinnertime 08/07/14  Yes Historical Provider, MD  insulin detemir (LEVEMIR) 100 UNIT/ML injection Inject 40 Units into the skin at bedtime.    Yes Historical Provider, MD  isosorbide dinitrate (ISORDIL) 10 MG tablet Take 10 mg by mouth 3 (three) times daily as needed. For leg cramps   Yes Historical Provider, MD  metFORMIN (GLUCOPHAGE) 500 MG tablet Take 500 mg by mouth 2 (two) times  daily with a meal.   Yes Historical Provider, MD  metoprolol (LOPRESSOR) 100 MG tablet Take 100 mg by mouth 2 (two) times daily.   Yes Historical Provider, MD  SPIRIVA HANDIHALER 18 MCG inhalation capsule Place 18 mcg into inhaler and inhale daily. 07/30/14  Yes Historical Provider, MD      Review of Systems  Constitutional: Positive for fatigue. Negative for appetite change.  HENT: Positive for congestion. Negative for postnasal drip and sore throat.   Eyes: Negative.    Respiratory: Positive for cough and shortness of breath (with exertion). Negative for chest tightness and wheezing.   Cardiovascular: Negative for chest pain, palpitations and leg swelling.  Gastrointestinal: Negative for abdominal pain and abdominal distention.  Endocrine: Negative.   Genitourinary: Negative.   Musculoskeletal: Positive for back pain (on occasion). Negative for neck pain.  Skin: Negative.   Allergic/Immunologic: Negative.   Neurological: Negative for dizziness, vertigo, seizures, weakness, light-headedness and headaches.  Hematological: Negative for adenopathy. Does not bruise/bleed easily.  Psychiatric/Behavioral: Positive for sleep disturbance (sleep on 2 pillows). Negative for dysphoric mood. The patient is not nervous/anxious.        Objective:   Physical Exam  Constitutional: He is oriented to person, place, and time. He appears well-developed and well-nourished.  HENT:  Head: Normocephalic and atraumatic.  Eyes: Conjunctivae are normal. Pupils are equal, round, and reactive to light.  Neck: Normal range of motion. Neck supple.  Cardiovascular: Normal rate and regular rhythm.   Pulmonary/Chest: Effort normal. He has no wheezes. He has no rales.  Abdominal: Soft. He exhibits no distension. There is no tenderness.  Musculoskeletal: He exhibits no edema or tenderness.  Neurological: He is alert and oriented to person, place, and time.  Skin: Skin is warm and dry.  Psychiatric: He has a normal mood and affect. His behavior is normal. Thought content normal.  Nursing note and vitals reviewed.   BP 146/86 mmHg  Pulse 76  Resp 20  Ht 6' (1.829 m)  Wt 291 lb (131.997 kg)  BMI 39.46 kg/m2  SpO2 97%       Assessment & Plan:  1: Chronic heart failure with preserved ejection fraction- Patient presents with chronic fatigue and some shortness of breath with exertion. He says that he was a little short of breath upon walking into the office but once he sat down  for a few minutes, he quickly recovered. He has not been weighing himself as he doesn't have any scales so a set of scales was given to him. He was instructed to call for an overnight weight gain of >2 pounds or a weekly weight gain of >5 pounds. He is not adding salt to his food and he doesn't cook with salt either. Reviewed the importance of following a 2000mg  sodium diet and how to read food labels. Written information was provided to him as well.  2: COPD- Patient follows with Dr. [name]. No wheezing heard today. Does use his inhalers as directed. Will make a referral to pulmonary rehab. Brochure was also given to the patient.  3: Sleep apnea- Patient falls asleep quickly sitting on the exam table. He says that he wears his CPAP on a nightly basis and it has a humidifier on it. He follows with Dr. [name] regarding this. He does say that his mask is getting close to 44 years old. Encouraged him to speak with respiratory company about getting a new mask to make sure it is still fitting correctly.  4: Diabetes- Patient  says that his glucose last night was 106 but he hadn't checked it yet before coming. Follows closely with PCP. 5: Tobacco use- Patient says that he smokes about 1/2 ppd of cigarettes and has for many years. Is going to try decreasing usage. Complete cessation was discussed for 3 minutes with him.  Return in 1 month or sooner for any questions/problems before then.

## 2014-10-19 DIAGNOSIS — E785 Hyperlipidemia, unspecified: Secondary | ICD-10-CM | POA: Insufficient documentation

## 2014-10-19 DIAGNOSIS — Z720 Tobacco use: Secondary | ICD-10-CM | POA: Insufficient documentation

## 2014-10-19 DIAGNOSIS — E119 Type 2 diabetes mellitus without complications: Secondary | ICD-10-CM | POA: Insufficient documentation

## 2014-10-19 DIAGNOSIS — E1169 Type 2 diabetes mellitus with other specified complication: Secondary | ICD-10-CM | POA: Insufficient documentation

## 2014-11-17 ENCOUNTER — Encounter: Payer: Self-pay | Admitting: Family

## 2014-11-17 ENCOUNTER — Ambulatory Visit: Payer: Medicare Other | Attending: Family | Admitting: Family

## 2014-11-17 VITALS — BP 125/79 | HR 81 | Resp 20 | Ht 72.0 in | Wt 290.0 lb

## 2014-11-17 DIAGNOSIS — Z7982 Long term (current) use of aspirin: Secondary | ICD-10-CM | POA: Diagnosis not present

## 2014-11-17 DIAGNOSIS — G4733 Obstructive sleep apnea (adult) (pediatric): Secondary | ICD-10-CM | POA: Insufficient documentation

## 2014-11-17 DIAGNOSIS — I5032 Chronic diastolic (congestive) heart failure: Secondary | ICD-10-CM | POA: Diagnosis present

## 2014-11-17 DIAGNOSIS — Z7984 Long term (current) use of oral hypoglycemic drugs: Secondary | ICD-10-CM | POA: Diagnosis not present

## 2014-11-17 DIAGNOSIS — G4730 Sleep apnea, unspecified: Secondary | ICD-10-CM

## 2014-11-17 DIAGNOSIS — F17200 Nicotine dependence, unspecified, uncomplicated: Secondary | ICD-10-CM | POA: Diagnosis not present

## 2014-11-17 DIAGNOSIS — J449 Chronic obstructive pulmonary disease, unspecified: Secondary | ICD-10-CM | POA: Diagnosis not present

## 2014-11-17 DIAGNOSIS — Z794 Long term (current) use of insulin: Secondary | ICD-10-CM | POA: Diagnosis not present

## 2014-11-17 DIAGNOSIS — I10 Essential (primary) hypertension: Secondary | ICD-10-CM | POA: Diagnosis not present

## 2014-11-17 DIAGNOSIS — Z8673 Personal history of transient ischemic attack (TIA), and cerebral infarction without residual deficits: Secondary | ICD-10-CM | POA: Diagnosis not present

## 2014-11-17 DIAGNOSIS — E7800 Pure hypercholesterolemia, unspecified: Secondary | ICD-10-CM | POA: Insufficient documentation

## 2014-11-17 DIAGNOSIS — Z720 Tobacco use: Secondary | ICD-10-CM

## 2014-11-17 DIAGNOSIS — Z880 Allergy status to penicillin: Secondary | ICD-10-CM | POA: Diagnosis not present

## 2014-11-17 DIAGNOSIS — Z79899 Other long term (current) drug therapy: Secondary | ICD-10-CM | POA: Diagnosis not present

## 2014-11-17 DIAGNOSIS — E119 Type 2 diabetes mellitus without complications: Secondary | ICD-10-CM | POA: Insufficient documentation

## 2014-11-17 DIAGNOSIS — I2510 Atherosclerotic heart disease of native coronary artery without angina pectoris: Secondary | ICD-10-CM | POA: Insufficient documentation

## 2014-11-17 DIAGNOSIS — J410 Simple chronic bronchitis: Secondary | ICD-10-CM

## 2014-11-17 NOTE — Progress Notes (Signed)
Subjective:    Patient ID: [name], male    DOB: Oct 13, 1970, 44 y.o.   MRN: [id number]  Congestive Heart Failure Presents for follow-up visit. Associated symptoms include fatigue and shortness of breath (with walking into the office). Pertinent negatives include no abdominal pain, chest pain, edema, muscle weakness, orthopnea or palpitations. The symptoms have been stable. Past treatments include beta blockers and salt and fluid restriction. The treatment provided moderate relief. Compliance with prior treatments has been good. His past medical history is significant for CAD, chronic lung disease, CVA, DM and HTN. He has one 1st degree relative with heart disease. Compliance with total regimen is 76-100%.  Hypertension This is a chronic problem. The current episode started more than 1 year ago. The problem has been gradually improving since onset. The problem is controlled. Associated symptoms include malaise/fatigue and shortness of breath (with walking into the office). Pertinent negatives include no anxiety, chest pain, headaches, palpitations or peripheral edema. There are no associated agents to hypertension. Risk factors for coronary artery disease include diabetes mellitus, dyslipidemia, family history, male gender, obesity and smoking/tobacco exposure. Past treatments include alpha 1 blockers, beta blockers, diuretics and lifestyle changes. The current treatment provides moderate improvement. Compliance problems include exercise.  Hypertensive end-organ damage includes CAD/MI, CVA and heart failure.    Past Medical History  Diagnosis Date  . Hypertension   . Diabetes mellitus without complication ([place])   . COPD (chronic obstructive pulmonary disease) ([place])   . Hypercholesteremia unk  . Coronary artery disease   . Stroke ([place])   . Sleep apnea     does not use CPAP regularly.  . Sarcoidosis [facility])     Past Surgical History  Procedure Laterality Date  . Appendectomy       Family History  Problem Relation Age of Onset  . Hypertension Mother   . Heart disease Mother   . Diabetes Mother   . Cancer Father     Social History  Substance Use Topics  . Smoking status: Current Every Day Smoker -- 0.33 packs/day    Types: Cigarettes  . Smokeless tobacco: Never Used  . Alcohol Use: 0.0 oz/week    0 Standard drinks or equivalent per week     Comment: occassional    Allergies  Allergen Reactions  . Penicillins Anaphylaxis    Prior to Admission medications   Medication Sig Start Date End Date Taking? Authorizing Provider  albuterol (PROVENTIL HFA;VENTOLIN HFA) 108 (90 BASE) MCG/ACT inhaler Inhale 1-2 puffs into the lungs every 6 (six) hours as needed for wheezing or shortness of breath.    Yes Historical Provider, MD  aspirin 81 MG chewable tablet Chew 81 mg by mouth every morning.   Yes Historical Provider, MD  atorvastatin (LIPITOR) 40 MG tablet Take 40 mg by mouth at bedtime.    Yes Historical Provider, MD  cloNIDine (CATAPRES - DOSED IN MG/24 HR) 0.2 mg/24hr patch Place 1 patch onto the skin once a week. On Monday 08/07/14  Yes Historical Provider, MD  Fluticasone-Salmeterol (ADVAIR) 250-50 MCG/DOSE AEPB Inhale 1 puff into the lungs 2 (two) times daily.   Yes Historical Provider, MD  furosemide (LASIX) 40 MG tablet Take 1 tablet (40 mg total) by mouth 2 (two) times daily. 08/26/14  Yes Dustin Flock, MD  HUMALOG KWIKPEN 100 UNIT/ML KiwkPen Inject 15-25 Units into the skin every evening. At dinnertime 08/07/14  Yes Historical Provider, MD  insulin detemir (LEVEMIR) 100 UNIT/ML injection Inject 40 Units into the skin  at bedtime.    Yes Historical Provider, MD  isosorbide dinitrate (ISORDIL) 10 MG tablet Take 10 mg by mouth 3 (three) times daily as needed. For leg cramps   Yes Historical Provider, MD  metFORMIN (GLUCOPHAGE) 500 MG tablet Take 500 mg by mouth 2 (two) times daily with a meal.   Yes Historical Provider, MD  metoprolol (LOPRESSOR) 100 MG tablet  Take 100 mg by mouth 2 (two) times daily.   Yes Historical Provider, MD  SPIRIVA HANDIHALER 18 MCG inhalation capsule Place 18 mcg into inhaler and inhale daily. 07/30/14  Yes Historical Provider, MD     Review of Systems  Constitutional: Positive for malaise/fatigue and fatigue. Negative for appetite change.  HENT: Negative for congestion, postnasal drip and sore throat.   Eyes: Negative.   Respiratory: Positive for shortness of breath (with walking into the office). Negative for cough, chest tightness and wheezing.   Cardiovascular: Negative for chest pain, palpitations and leg swelling.  Gastrointestinal: Negative for abdominal pain and abdominal distention.  Endocrine: Negative.   Genitourinary: Negative.   Musculoskeletal: Negative for back pain, muscle weakness and neck stiffness.  Skin: Negative.   Allergic/Immunologic: Negative.   Neurological: Negative for dizziness, light-headedness and headaches.  Hematological: Negative for adenopathy. Does not bruise/bleed easily.  Psychiatric/Behavioral: Negative for sleep disturbance (sleeping on 2 pillows) and dysphoric mood. The patient is not nervous/anxious.        Objective:   Physical Exam  Constitutional: He is oriented to person, place, and time. He appears well-developed and well-nourished. He is sleeping.  HENT:  Head: Normocephalic and atraumatic.  Eyes: Conjunctivae are normal. Pupils are equal, round, and reactive to light.  Neck: Normal range of motion. Neck supple.  Cardiovascular: Normal rate and regular rhythm.   Pulmonary/Chest: Effort normal. He has wheezes in the right lower field and the left lower field. He has no rales.  Abdominal: Soft. He exhibits no distension. There is no tenderness.  Musculoskeletal: He exhibits no edema or tenderness.  Neurological: He is alert and oriented to person, place, and time.  Skin: Skin is warm and dry.  Psychiatric: He has a normal mood and affect. His behavior is normal.   Nursing note and vitals reviewed.  BP 125/79 mmHg  Pulse 81  Resp 20  Ht 6' (1.829 m)  Wt 290 lb (131.543 kg)  BMI 39.32 kg/m2  SpO2 93%        Assessment & Plan:  1: Chronic heart failure with preserved ejection fraction- Patient presents with continued shortness of breath and fatigue upon exertion. He said that he was a little short of breath upon walking into the office but once he sat down for a few minutes, his breathing improved. He says that he's quite tired today but says that he didn't sleep well the night before and that when he takes all his medications, he gets sleepy. Patient is falling asleep throughout the entire visit. He continues to weigh himself daily and says that his weight has been stable. Reminded to call for an overnight weight gain of >2 pounds or a weekly weight gain of >5 pounds. He is not adding any salt to his food and is following a low sodium diet. Hasn't heard anything regarding the pulmonary rehab referral that was previously placed so a message was left with pulmonary rehab asking for followup.  2: Diabetes- Patient says that his glucose was 102 this morning. He continues to take his insulin and metformin daily. 3: Sleep apnea- Patient  is very sleepy during the visit and has to be woken up numerous times as he falls back asleep quickly. He says that it's because he didn't sleep well the night before. Continues to wear his CPAP on a nightly basis and says that he's supposed to be receiving a new mask sometime soon. 4: Tobacco use- Patient says that he's now smoking <1/2 ppd of cigarettes. Discussed complete cessation for 3 minutes. 5: COPD- Wheezing noted in lower lobes. He says that he just recently used his inhalers. Followup with PCP if this worsens.   Return in 3 months or sooner for any questions/problems before then.

## 2014-11-17 NOTE — Patient Instructions (Signed)
Continue weighing daily and call for an overnight weight gain of > 2 pounds or a weekly weight gain of >5 pounds.    Smoking Cessation Quitting smoking is important to your health and has many advantages. However, it is not always easy to quit since nicotine is a very addictive drug. Oftentimes, people try 3 times or more before being able to quit. This document explains the best ways for you to prepare to quit smoking. Quitting takes hard work and a lot of effort, but you can do it. ADVANTAGES OF QUITTING SMOKING  You will live longer, feel better, and live better.  Your body will feel the impact of quitting smoking almost immediately.  Within 20 minutes, blood pressure decreases. Your pulse returns to its normal level.  After 8 hours, carbon monoxide levels in the blood return to normal. Your oxygen level increases.  After 24 hours, the chance of having a heart attack starts to decrease. Your breath, hair, and body stop smelling like smoke.  After 48 hours, damaged nerve endings begin to recover. Your sense of taste and smell improve.  After 72 hours, the body is virtually free of nicotine. Your bronchial tubes relax and breathing becomes easier.  After 2 to 12 weeks, lungs can hold more air. Exercise becomes easier and circulation improves.  The risk of having a heart attack, stroke, cancer, or lung disease is greatly reduced.  After 1 year, the risk of coronary heart disease is cut in half.  After 5 years, the risk of stroke falls to the same as a nonsmoker.  After 10 years, the risk of lung cancer is cut in half and the risk of other cancers decreases significantly.  After 15 years, the risk of coronary heart disease drops, usually to the level of a nonsmoker.  If you are pregnant, quitting smoking will improve your chances of having a healthy baby.  The people you live with, especially any children, will be healthier.  You will have extra money to spend on things other  than cigarettes. QUESTIONS TO THINK ABOUT BEFORE ATTEMPTING TO QUIT You may want to talk about your answers with your health care provider.  Why do you want to quit?  If you tried to quit in the past, what helped and what did not?  What will be the most difficult situations for you after you quit? How will you plan to handle them?  Who can help you through the tough times? Your family? Friends? A health care provider?  What pleasures do you get from smoking? What ways can you still get pleasure if you quit? Here are some questions to ask your health care provider:  How can you help me to be successful at quitting?  What medicine do you think would be best for me and how should I take it?  What should I do if I need more help?  What is smoking withdrawal like? How can I get information on withdrawal? GET READY  Set a quit date.  Change your environment by getting rid of all cigarettes, ashtrays, matches, and lighters in your home, car, or work. Do not let people smoke in your home.  Review your past attempts to quit. Think about what worked and what did not. GET SUPPORT AND ENCOURAGEMENT You have a better chance of being successful if you have help. You can get support in many ways.  Tell your family, friends, and coworkers that you are going to quit and need their support. Ask   them not to smoke around you.  Get individual, group, or telephone counseling and support. Programs are available at local hospitals and health centers. Call your local health department for information about programs in your area.  Spiritual beliefs and practices may help some smokers quit.  Download a "quit meter" on your computer to keep track of quit statistics, such as how long you have gone without smoking, cigarettes not smoked, and money saved.  Get a self-help book about quitting smoking and staying off tobacco. LEARN NEW SKILLS AND BEHAVIORS  Distract yourself from urges to smoke. Talk to  someone, go for a walk, or occupy your time with a task.  Change your normal routine. Take a different route to work. Drink tea instead of coffee. Eat breakfast in a different place.  Reduce your stress. Take a hot bath, exercise, or read a book.  Plan something enjoyable to do every day. Reward yourself for not smoking.  Explore interactive web-based programs that specialize in helping you quit. GET MEDICINE AND USE IT CORRECTLY Medicines can help you stop smoking and decrease the urge to smoke. Combining medicine with the above behavioral methods and support can greatly increase your chances of successfully quitting smoking.  Nicotine replacement therapy helps deliver nicotine to your body without the negative effects and risks of smoking. Nicotine replacement therapy includes nicotine gum, lozenges, inhalers, nasal sprays, and skin patches. Some may be available over-the-counter and others require a prescription.  Antidepressant medicine helps people abstain from smoking, but how this works is unknown. This medicine is available by prescription.  Nicotinic receptor partial agonist medicine simulates the effect of nicotine in your brain. This medicine is available by prescription. Ask your health care provider for advice about which medicines to use and how to use them based on your health history. Your health care provider will tell you what side effects to look out for if you choose to be on a medicine or therapy. Carefully read the information on the package. Do not use any other product containing nicotine while using a nicotine replacement product.  RELAPSE OR DIFFICULT SITUATIONS Most relapses occur within the first 3 months after quitting. Do not be discouraged if you start smoking again. Remember, most people try several times before finally quitting. You may have symptoms of withdrawal because your body is used to nicotine. You may crave cigarettes, be irritable, feel very hungry, cough  often, get headaches, or have difficulty concentrating. The withdrawal symptoms are only temporary. They are strongest when you first quit, but they will go away within 10-14 days. To reduce the chances of relapse, try to:  Avoid drinking alcohol. Drinking lowers your chances of successfully quitting.  Reduce the amount of caffeine you consume. Once you quit smoking, the amount of caffeine in your body increases and can give you symptoms, such as a rapid heartbeat, sweating, and anxiety.  Avoid smokers because they can make you want to smoke.  Do not let weight gain distract you. Many smokers will gain weight when they quit, usually less than 10 pounds. Eat a healthy diet and stay active. You can always lose the weight gained after you quit.  Find ways to improve your mood other than smoking. FOR MORE INFORMATION  www.smokefree.gov  Document Released: 01/16/2001 Document Revised: 06/08/2013 Document Reviewed: 05/03/2011 [employer] Patient Information 2015 [employer]. This information is not intended to replace advice given to you by your health care provider. Make sure you discuss any questions you have with your   health care provider.  

## 2014-12-28 ENCOUNTER — Emergency Department: Payer: Medicare Other

## 2014-12-28 ENCOUNTER — Inpatient Hospital Stay
Admission: EM | Admit: 2014-12-28 | Discharge: 2014-12-30 | DRG: 190 | Disposition: A | Discharge status: Home / Self Care | Payer: Medicare Other | Attending: Internal Medicine | Admitting: Internal Medicine

## 2014-12-28 ENCOUNTER — Encounter: Payer: Self-pay | Admitting: Emergency Medicine

## 2014-12-28 DIAGNOSIS — E119 Type 2 diabetes mellitus without complications: Secondary | ICD-10-CM | POA: Diagnosis present

## 2014-12-28 DIAGNOSIS — J9622 Acute and chronic respiratory failure with hypercapnia: Secondary | ICD-10-CM | POA: Diagnosis present

## 2014-12-28 DIAGNOSIS — F17210 Nicotine dependence, cigarettes, uncomplicated: Secondary | ICD-10-CM | POA: Diagnosis present

## 2014-12-28 DIAGNOSIS — E7800 Pure hypercholesterolemia, unspecified: Secondary | ICD-10-CM | POA: Diagnosis present

## 2014-12-28 DIAGNOSIS — E785 Hyperlipidemia, unspecified: Secondary | ICD-10-CM | POA: Diagnosis present

## 2014-12-28 DIAGNOSIS — Z9119 Patient's noncompliance with other medical treatment and regimen: Secondary | ICD-10-CM

## 2014-12-28 DIAGNOSIS — I2510 Atherosclerotic heart disease of native coronary artery without angina pectoris: Secondary | ICD-10-CM | POA: Diagnosis present

## 2014-12-28 DIAGNOSIS — J441 Chronic obstructive pulmonary disease with (acute) exacerbation: Secondary | ICD-10-CM | POA: Diagnosis present

## 2014-12-28 DIAGNOSIS — Z794 Long term (current) use of insulin: Secondary | ICD-10-CM | POA: Diagnosis not present

## 2014-12-28 DIAGNOSIS — G4733 Obstructive sleep apnea (adult) (pediatric): Secondary | ICD-10-CM | POA: Diagnosis present

## 2014-12-28 DIAGNOSIS — Z6838 Body mass index (BMI) 38.0-38.9, adult: Secondary | ICD-10-CM

## 2014-12-28 DIAGNOSIS — J9621 Acute and chronic respiratory failure with hypoxia: Secondary | ICD-10-CM | POA: Diagnosis present

## 2014-12-28 DIAGNOSIS — J9601 Acute respiratory failure with hypoxia: Secondary | ICD-10-CM | POA: Diagnosis not present

## 2014-12-28 DIAGNOSIS — Z79899 Other long term (current) drug therapy: Secondary | ICD-10-CM

## 2014-12-28 DIAGNOSIS — I5032 Chronic diastolic (congestive) heart failure: Secondary | ICD-10-CM | POA: Diagnosis present

## 2014-12-28 DIAGNOSIS — Z7982 Long term (current) use of aspirin: Secondary | ICD-10-CM

## 2014-12-28 DIAGNOSIS — D869 Sarcoidosis, unspecified: Secondary | ICD-10-CM | POA: Diagnosis present

## 2014-12-28 DIAGNOSIS — Z880 Allergy status to penicillin: Secondary | ICD-10-CM | POA: Diagnosis not present

## 2014-12-28 DIAGNOSIS — Z8673 Personal history of transient ischemic attack (TIA), and cerebral infarction without residual deficits: Secondary | ICD-10-CM | POA: Diagnosis not present

## 2014-12-28 DIAGNOSIS — I110 Hypertensive heart disease with heart failure: Secondary | ICD-10-CM | POA: Diagnosis present

## 2014-12-28 DIAGNOSIS — E662 Morbid (severe) obesity with alveolar hypoventilation: Secondary | ICD-10-CM | POA: Diagnosis present

## 2014-12-28 DIAGNOSIS — Z7984 Long term (current) use of oral hypoglycemic drugs: Secondary | ICD-10-CM | POA: Diagnosis not present

## 2014-12-28 DIAGNOSIS — J9602 Acute respiratory failure with hypercapnia: Secondary | ICD-10-CM

## 2014-12-28 DIAGNOSIS — Z720 Tobacco use: Secondary | ICD-10-CM | POA: Diagnosis not present

## 2014-12-28 LAB — CBC WITH DIFFERENTIAL/PLATELET
BASOS PCT: 0 %
Basophils Absolute: 0 10*3/uL (ref 0–0.1)
EOS PCT: 0 %
Eosinophils Absolute: 0 10*3/uL (ref 0–0.7)
HCT: 52.1 % — ABNORMAL HIGH (ref 40.0–52.0)
Hemoglobin: 16.6 g/dL (ref 13.0–18.0)
LYMPHS PCT: 15 %
Lymphs Abs: 1.1 10*3/uL (ref 1.0–3.6)
MCH: 27.4 pg (ref 26.0–34.0)
MCHC: 31.9 g/dL — ABNORMAL LOW (ref 32.0–36.0)
MCV: 86 fL (ref 80.0–100.0)
Monocytes Absolute: 1 10*3/uL (ref 0.2–1.0)
Monocytes Relative: 14 %
Neutro Abs: 5.1 10*3/uL (ref 1.4–6.5)
Neutrophils Relative %: 71 %
Platelets: 182 10*3/uL (ref 150–440)
RBC: 6.06 MIL/uL — AB (ref 4.40–5.90)
RDW: 14.9 % — AB (ref 11.5–14.5)
WBC: 7.3 10*3/uL (ref 3.8–10.6)

## 2014-12-28 LAB — COMPREHENSIVE METABOLIC PANEL
ALT: 29 U/L (ref 17–63)
AST: 26 U/L (ref 15–41)
Albumin: 3.5 g/dL (ref 3.5–5.0)
Alkaline Phosphatase: 83 U/L (ref 38–126)
Anion gap: 6 (ref 5–15)
BUN: 13 mg/dL (ref 6–20)
CO2: 35 mmol/L — ABNORMAL HIGH (ref 22–32)
Calcium: 8.6 mg/dL — ABNORMAL LOW (ref 8.9–10.3)
Chloride: 101 mmol/L (ref 101–111)
Creatinine, Ser: 1.23 mg/dL (ref 0.61–1.24)
GFR calc Af Amer: >60 mL/min (ref >60)
Glucose, Bld: 231 mg/dL — ABNORMAL HIGH (ref 65–99)
POTASSIUM: 4.3 mmol/L (ref 3.5–5.1)
Sodium: 142 mmol/L (ref 135–145)
TOTAL PROTEIN: 6.8 g/dL (ref 6.5–8.1)
Total Bilirubin: 0.6 mg/dL (ref 0.3–1.2)

## 2014-12-28 LAB — BLOOD GAS, ARTERIAL
ACID-BASE EXCESS: 8.2 mmol/L — AB (ref 0.0–3.0)
Allens test (pass/fail): POSITIVE — AB
Bicarbonate: 39.9 mEq/L — ABNORMAL HIGH (ref 21.0–28.0)
FIO2: 0.52
O2 SAT: 95.6 %
PATIENT TEMPERATURE: 37
PH ART: 7.26 — AB (ref 7.350–7.450)
pCO2 arterial: 89 mmHg (ref 32.0–48.0)
pO2, Arterial: 90 mmHg (ref 83.0–108.0)

## 2014-12-28 LAB — BRAIN NATRIURETIC PEPTIDE: B NATRIURETIC PEPTIDE 5: 68 pg/mL (ref 0.0–100.0)

## 2014-12-28 LAB — TROPONIN I: TROPONIN I: 0.04 ng/mL — AB (ref <0.031)

## 2014-12-28 LAB — MAGNESIUM: MAGNESIUM: 1.9 mg/dL (ref 1.7–2.4)

## 2014-12-28 MED ORDER — INSULIN DETEMIR 100 UNIT/ML ~~LOC~~ SOLN
40.0000 [IU] | Freq: Every day | SUBCUTANEOUS | Status: DC
  Administered 2014-12-28 – 2014-12-29 (×2): 40 [IU] via SUBCUTANEOUS
  Filled 2014-12-28 (×3): qty 0.4

## 2014-12-28 MED ORDER — ATORVASTATIN CALCIUM 20 MG PO TABS
40.0000 mg | ORAL_TABLET | Freq: Every day | ORAL | Status: DC
  Administered 2014-12-28 – 2014-12-29 (×2): 40 mg via ORAL
  Filled 2014-12-28 (×2): qty 2

## 2014-12-28 MED ORDER — ALBUTEROL SULFATE (2.5 MG/3ML) 0.083% IN NEBU: 2.5000 mg | INHALATION_SOLUTION | Freq: Four times a day (QID) | RESPIRATORY_TRACT | Status: DC

## 2014-12-28 MED ORDER — ACETAMINOPHEN 650 MG RE SUPP: 650.0000 mg | Freq: Four times a day (QID) | RECTAL | Status: DC | PRN

## 2014-12-28 MED ORDER — LISINOPRIL 10 MG PO TABS
10.0000 mg | ORAL_TABLET | Freq: Every day | ORAL | Status: DC
  Administered 2014-12-29 – 2014-12-30 (×2): 10 mg via ORAL
  Filled 2014-12-28 (×2): qty 1

## 2014-12-28 MED ORDER — METFORMIN HCL ER 500 MG PO TB24
1000.0000 mg | ORAL_TABLET | Freq: Two times a day (BID) | ORAL | Status: DC
  Administered 2014-12-28 – 2014-12-30 (×4): 500 mg via ORAL
  Filled 2014-12-28 (×4): qty 2

## 2014-12-28 MED ORDER — ONDANSETRON HCL 4 MG/2ML IJ SOLN: 4.0000 mg | Freq: Four times a day (QID) | INTRAMUSCULAR | Status: DC | PRN

## 2014-12-28 MED ORDER — FUROSEMIDE 40 MG PO TABS
40.0000 mg | ORAL_TABLET | Freq: Two times a day (BID) | ORAL | Status: DC
  Administered 2014-12-29 – 2014-12-30 (×3): 40 mg via ORAL
  Filled 2014-12-28 (×3): qty 1

## 2014-12-28 MED ORDER — ISOSORBIDE DINITRATE 20 MG PO TABS
10.0000 mg | ORAL_TABLET | Freq: Three times a day (TID) | ORAL | Status: DC
  Administered 2014-12-28 – 2014-12-30 (×5): 10 mg via ORAL
  Filled 2014-12-28 (×4): qty 1
  Filled 2014-12-28: qty 2

## 2014-12-28 MED ORDER — CLONIDINE HCL 0.2 MG/24HR TD PTWK
0.2000 mg | MEDICATED_PATCH | TRANSDERMAL | Status: DC
  Administered 2014-12-28: 0.2 mg via TRANSDERMAL
  Filled 2014-12-28: qty 1

## 2014-12-28 MED ORDER — IPRATROPIUM-ALBUTEROL 0.5-2.5 (3) MG/3ML IN SOLN
3.0000 mL | Freq: Once | RESPIRATORY_TRACT | Status: AC
  Administered 2014-12-28: 3 mL via RESPIRATORY_TRACT
  Filled 2014-12-28: qty 3

## 2014-12-28 MED ORDER — ASPIRIN 81 MG PO CHEW
81.0000 mg | CHEWABLE_TABLET | Freq: Every day | ORAL | Status: DC
  Administered 2014-12-29 – 2014-12-30 (×2): 81 mg via ORAL
  Filled 2014-12-28 (×2): qty 1

## 2014-12-28 MED ORDER — METHYLPREDNISOLONE SODIUM SUCC 125 MG IJ SOLR: 60.0000 mg | INTRAMUSCULAR | Status: DC

## 2014-12-28 MED ORDER — ALBUTEROL SULFATE (2.5 MG/3ML) 0.083% IN NEBU: 2.5000 mg | INHALATION_SOLUTION | Freq: Four times a day (QID) | RESPIRATORY_TRACT | Status: DC | PRN

## 2014-12-28 MED ORDER — ONDANSETRON HCL 4 MG PO TABS: 4.0000 mg | ORAL_TABLET | Freq: Four times a day (QID) | ORAL | Status: DC | PRN

## 2014-12-28 MED ORDER — INSULIN ASPART 100 UNIT/ML ~~LOC~~ SOLN
0.0000 [IU] | Freq: Three times a day (TID) | SUBCUTANEOUS | Status: DC
  Administered 2014-12-29: 7 [IU] via SUBCUTANEOUS
  Filled 2014-12-28: qty 7

## 2014-12-28 MED ORDER — ALBUTEROL SULFATE (2.5 MG/3ML) 0.083% IN NEBU
2.5000 mg | INHALATION_SOLUTION | Freq: Four times a day (QID) | RESPIRATORY_TRACT | Status: DC
Start: 2014-12-29 — End: 2014-12-29
  Administered 2014-12-29: 2.5 mg via RESPIRATORY_TRACT
  Filled 2014-12-28: qty 3

## 2014-12-28 MED ORDER — MOMETASONE FURO-FORMOTEROL FUM 100-5 MCG/ACT IN AERO
2.0000 | INHALATION_SPRAY | Freq: Two times a day (BID) | RESPIRATORY_TRACT | Status: DC
  Administered 2014-12-28 – 2014-12-29 (×2): 2 via RESPIRATORY_TRACT
  Filled 2014-12-28: qty 8.8

## 2014-12-28 MED ORDER — METOPROLOL TARTRATE 100 MG PO TABS
100.0000 mg | ORAL_TABLET | Freq: Two times a day (BID) | ORAL | Status: DC
  Administered 2014-12-28 – 2014-12-30 (×4): 100 mg via ORAL
  Filled 2014-12-28 (×4): qty 1

## 2014-12-28 MED ORDER — MAGNESIUM SULFATE 2 GM/50ML IV SOLN
2.0000 g | Freq: Once | INTRAVENOUS | Status: AC
  Administered 2014-12-28: 2 g via INTRAVENOUS
  Filled 2014-12-28: qty 50

## 2014-12-28 MED ORDER — METHYLPREDNISOLONE SODIUM SUCC 125 MG IJ SOLR
125.0000 mg | Freq: Once | INTRAMUSCULAR | Status: AC
  Administered 2014-12-28: 125 mg via INTRAVENOUS
  Filled 2014-12-28: qty 2

## 2014-12-28 MED ORDER — ENOXAPARIN SODIUM 40 MG/0.4ML ~~LOC~~ SOLN
40.0000 mg | SUBCUTANEOUS | Status: DC
  Filled 2014-12-28: qty 0.4

## 2014-12-28 MED ORDER — SODIUM CHLORIDE 0.9 % IV SOLN: 250.0000 mL | INTRAVENOUS | Status: DC | PRN

## 2014-12-28 MED ORDER — SODIUM CHLORIDE 0.9 % IJ SOLN
3.0000 mL | Freq: Two times a day (BID) | INTRAMUSCULAR | Status: DC
  Administered 2014-12-28 – 2014-12-29 (×3): 3 mL via INTRAVENOUS

## 2014-12-28 MED ORDER — TIOTROPIUM BROMIDE MONOHYDRATE 18 MCG IN CAPS
18.0000 ug | ORAL_CAPSULE | Freq: Every day | RESPIRATORY_TRACT | Status: DC
  Administered 2014-12-29: 18 ug via RESPIRATORY_TRACT
  Filled 2014-12-28: qty 5

## 2014-12-28 MED ORDER — SODIUM CHLORIDE 0.9 % IJ SOLN: 3.0000 mL | INTRAMUSCULAR | Status: DC | PRN

## 2014-12-28 MED ORDER — ACETAMINOPHEN 325 MG PO TABS: 650.0000 mg | ORAL_TABLET | Freq: Four times a day (QID) | ORAL | Status: DC | PRN

## 2014-12-28 NOTE — Progress Notes (Signed)
Pt transported to ICU on Bipap without incident.

## 2014-12-28 NOTE — ED Notes (Addendum)
C/o cough, congestion, body aches, headaches and weakness x 1 week, also c/o some sob, o2 sat 84% on RA

## 2014-12-28 NOTE — H&P (Signed)
[place] [facility] [place] NAME: [name]    MR#:  [id number]  DATE OF BIRTH:  [birth year]  DATE OF ADMISSION:  12/28/2014  PRIMARY CARE PHYSICIAN: [facility]   REQUESTING/REFERRING PHYSICIAN: Dr Karma Greaser  CHIEF COMPLAINT:   Increasing shortness of breath. Patient noted to be lethargic in the emergency room. HISTORY OF PRESENT ILLNESS:   [name]  is a 44 y.o. male with a known history ofCAD, type 2 diabetes, COPD on home oxygen however patient noncompliant With use of oxygen, morbid obesity, sarcoidosis comes to the emergency room with increasing shortness of breath with the last week.Patient stays with the cold weather approaching he started noticing shortness of breath. He is supposed to use oxygen however he is been noncompliant. His sats were 84% on room air. In addition to his chronic lung and cardiac issues he continues to smoke although he is decreased to less than half a pack a day. He denies any fever or productive cough. Patient was found to have CO2 of 89. He is being admitted for acute hypoxic hypercarbic respiratory failure   PAST MEDICAL HISTORY:   Past Medical History  Diagnosis Date  . Hypertension   . Diabetes mellitus without complication ([place])   . COPD (chronic obstructive pulmonary disease) ([place])   . Hypercholesteremia unk  . Coronary artery disease   . Stroke ([place])   . Sleep apnea     does not use CPAP regularly.  . Sarcoidosis ([place])     PAST SURGICAL HISTOIRY:   Past Surgical History  Procedure Laterality Date  . Appendectomy      SOCIAL HISTORY:   Social History  Substance Use Topics  . Smoking status: Current Every Day Smoker -- 0.33 packs/day    Types: Cigarettes  . Smokeless tobacco: Never Used  . Alcohol Use: 0.0 oz/week    0 Standard drinks or equivalent per week     Comment: occassional    FAMILY HISTORY:   Family History  Problem Relation Age of Onset   . Hypertension Mother   . Heart disease Mother   . Diabetes Mother   . Cancer Father     DRUG ALLERGIES:   Allergies  Allergen Reactions  . Penicillins Anaphylaxis and Other (See Comments)    Unable to obtain enough information to answer additional questions about this medication.      REVIEW OF SYSTEMS:  Review of Systems  Constitutional: Negative for fever, chills and weight loss.  HENT: Negative for ear discharge, ear pain and nosebleeds.   Eyes: Negative for blurred vision, pain and discharge.  Respiratory: Positive for shortness of breath. Negative for wheezing and stridor.   Cardiovascular: Negative for chest pain, palpitations, orthopnea and PND.  Gastrointestinal: Negative for nausea, vomiting, abdominal pain and diarrhea.  Genitourinary: Negative for urgency and frequency.  Musculoskeletal: Negative for back pain and joint pain.  Neurological: Positive for weakness. Negative for sensory change, speech change and focal weakness.  Psychiatric/Behavioral: Negative for depression and hallucinations. The patient is not nervous/anxious.   All other systems reviewed and are negative.    MEDICATIONS AT HOME:   Prior to Admission medications   Medication Sig Start Date End Date Taking? Authorizing Provider  albuterol (PROVENTIL HFA;VENTOLIN HFA) 108 (90 BASE) MCG/ACT inhaler Inhale 2 puffs into the lungs every 6 (six) hours as needed for wheezing or shortness of breath.    Yes Historical Provider, MD  aspirin 81 MG chewable tablet Chew  81 mg by mouth daily.   Yes Historical Provider, MD  atorvastatin (LIPITOR) 40 MG tablet Take 40 mg by mouth at bedtime.    Yes Historical Provider, MD  cloNIDine (CATAPRES - DOSED IN MG/24 HR) 0.2 mg/24hr patch Place 0.2 mg onto the skin once a week.   Yes Historical Provider, MD  Fluticasone-Salmeterol (ADVAIR) 250-50 MCG/DOSE AEPB Inhale 1 puff into the lungs 2 (two) times daily.   Yes Historical Provider, MD  furosemide (LASIX) 40 MG tablet  Take 1 tablet (40 mg total) by mouth 2 (two) times daily. 08/26/14  Yes Dustin Flock, MD  insulin detemir (LEVEMIR) 100 UNIT/ML injection Inject 40 Units into the skin at bedtime.    Yes Historical Provider, MD  insulin lispro (HUMALOG) 100 UNIT/ML injection Inject 8 Units into the skin every evening.   Yes Historical Provider, MD  isosorbide dinitrate (ISORDIL) 10 MG tablet Take 10 mg by mouth 3 (three) times daily.   Yes Historical Provider, MD  lisinopril (PRINIVIL,ZESTRIL) 10 MG tablet Take 10 mg by mouth daily.   Yes Historical Provider, MD  metFORMIN (GLUCOPHAGE-XR) 500 MG 24 hr tablet Take 1,000 mg by mouth 2 (two) times daily.   Yes Historical Provider, MD  metoprolol (LOPRESSOR) 100 MG tablet Take 100 mg by mouth 2 (two) times daily.   Yes Historical Provider, MD  tiotropium (SPIRIVA) 18 MCG inhalation capsule Place 18 mcg into inhaler and inhale daily.   Yes Historical Provider, MD      VITAL SIGNS:  Blood pressure 151/92, pulse 94, temperature 98.4 F (36.9 C), temperature source Oral, resp. rate 26, height 6' (1.829 m), weight 130.182 kg (287 lb), SpO2 94 %.  PHYSICAL EXAMINATION:  GENERAL:  44 y.o.-year-old patient lying in the bed with Mild acute distress. On BiPAP EYES: Pupils equal, round, reactive to light and accommodation. No scleral icterus. Extraocular muscles intact.  HEENT: Head atraumatic, normocephalic. Oropharynx and nasopharynx clear.  NECK:  Supple, no jugular venous distention. No thyroid enlargement, no tenderness.  LUNGS: Distant breath sounds bilaterally, no wheezing, rales,rhonchi or crepitation. No use of accessory muscles of respiration.  CARDIOVASCULAR: S1, S2 normal. No murmurs, rubs, or gallops.  ABDOMEN: Soft, nontender, nondistended. Bowel sounds present. No organomegaly or mass.  EXTREMITIES: No pedal edema, cyanosis, or clubbing.  NEUROLOGIC: Unable to assess since patient is on BiPAP. Grossly appears nonfocal PSYCHIATRIC: The patient is alert   SKIN: No obvious rash, lesion, or ulcer.   LABORATORY PANEL:   CBC  Recent Labs Lab 12/28/14 1639  WBC 7.3  HGB 16.6  HCT 52.1*  PLT 182   ------------------------------------------------------------------------------------------------------------------  Chemistries   Recent Labs Lab 12/28/14 1639  NA 142  K 4.3  CL 101  CO2 35*  GLUCOSE 231*  BUN 13  CREATININE 1.23  CALCIUM 8.6*  MG 1.9  AST 26  ALT 29  ALKPHOS 83  BILITOT 0.6   ------------------------------------------------------------------------------------------------------------------  Cardiac Enzymes  Recent Labs Lab 12/28/14 1639  TROPONINI 0.04*   ------------------------------------------------------------------------------------------------------------------  RADIOLOGY:  Dg Chest Portable 1 View  12/28/2014  CLINICAL DATA:  Cough and congestion. Body aches. Headache and weakness. Hypoxia. COPD. EXAM: PORTABLE CHEST 1 VIEW COMPARISON:  08/23/2014 FINDINGS: Stable scarring in both lungs with elevated left hemidiaphragm. Stably blunted left lateral costophrenic angle. Heart size within normal limits for AP projection. Chronic fullness of the left hilum, likely vascular. IMPRESSION: 1. Stable chronic scarring in both lungs. This likely relates to the patient's history of sarcoidosis. Elevated left hemidiaphragm with chronic blunting  of the left costophrenic angle. No significant new imaging findings. Electronically Signed   By: Van Clines M.D.   On: 12/28/2014 16:57   IMPRESSION AND PLAN:   [name]  is a 44 y.o. male with a known history ofCAD, type 2 diabetes, COPD on home oxygen however patient noncompliant With use of oxygen, morbid obesity, sarcoidosis comes to the emergency room with increasing shortness of breath with the last week  1. Acute hypoxic hypercarbic respiratory failure appears secondary to COPD exacerbation in the setting of tobacco abuse Admit patient to CCU  stepdown Weaned BiPAP as tolerated IVSolu-Medrol 60 daily, nebulizer treatment, been oxygen to 2 L nasal cannula,Spiriva inhalation daily No indication for IV antibiotics at present  chest x-ray does not show any evidence of pneumonia  2. 2 diabetes continue insulin, by mouth metformin and sliding scale  3. Hypertension continue lisinopril clonidine and metoprolol  4. Hyperlipidemia continue statin's  5. History of CAD stable Continue indoor aspirin metoprolol and statins  6. Tobacco abuse smoking cessation discussed about 3 minutes spent  7. DvT prophylaxis subcutaneous Lovenox  All the records are reviewed and case discussed with ED provider. Management plans discussed with the patient, family and they are in agreement.  CODE STATUS: Full  TOTALCritical TIME TAKING CARE OF THIS PATIENT:50 minutes.    Maricel Swartzendruber M.D on 12/28/2014 at 7:11 PM  Between 7am to 6pm - Pager - [phone]  After 6pm go to [email or web address] - password [email or web address] [facility]  Office  [phone]  CC: Primary care physician; [facility]

## 2014-12-28 NOTE — Progress Notes (Signed)
eLink Physician-Brief Progress Note Patient Name: [name] DOB: 1970-04-04 MRN: [id number]   Date of Service  12/28/2014  HPI/Events of Note  44 yo man with sarcoidosis, admitted with dyspnea, hypercapneic and hypoxemic resp failure. Initially required BiPAP. Currently improved and on [place], sitting up eating. No interventions at this time.    eICU Interventions       Intervention Category Evaluation Type: New Patient Evaluation  Osbaldo Mark S. 12/28/2014, 9:41 PM

## 2014-12-28 NOTE — ED Provider Notes (Signed)
[facility] Emergency Department Provider Note  ____________________________________________  Time seen: Approximately 5:11 PM  I have reviewed the triage vital signs and the nursing notes.   HISTORY  Chief Complaint Cough; Nasal Congestion; and Generalized Body Aches    HPI [name] is a 44 y.o. male with an extensive past medical history that includes CHF, COPD, and sarcoidosiswho presents with gradually worsening shortness of breath, significantly worsened by exertion, over the last week.  He arrived by private vehicle and his room air saturation was 84%.  [name] with oxygen on nasal cannula his oxygen would not come up above 85%.  He states that he has had general malaise, headache, myalgias, productive cough, congestion, and generalized weakness for this entire time (about one week).  He denies chest pain, nausea/vomiting, abdominal pain.  In addition to the chronic lung and cardiac issues, he also smokes tobacco although he is decreased to less than half a pack a day.  Describes his symptoms today as severe.   Past Medical History  Diagnosis Date  . Hypertension   . Diabetes mellitus without complication ([place])   . COPD (chronic obstructive pulmonary disease) ([place])   . Hypercholesteremia unk  . Coronary artery disease   . Stroke ([place])   . Sleep apnea     does not use CPAP regularly.  . Sarcoidosis [facility])     Patient Active Problem List   Diagnosis Date Noted  . Diabetes ([place]) 10/19/2014  . Tobacco use 10/19/2014  . Chronic diastolic heart failure ([place]) 10/18/2014  . Chronic obstructive pulmonary disease ([place]) 10/18/2014  . Sleep apnea 10/18/2014  . Acute on chronic respiratory failure with hypoxia ([place]) 08/24/2014  . Acute and chronic respiratory failure 04/22/2013  . Sarcoidosis ([place]) 04/22/2013  . Other and unspecified hyperlipidemia 04/22/2013    Past Surgical History  Procedure Laterality Date  . Appendectomy      Current  Outpatient Rx  Name  Route  Sig  Dispense  Refill  . albuterol (PROVENTIL HFA;VENTOLIN HFA) 108 (90 BASE) MCG/ACT inhaler   Inhalation   Inhale 1-2 puffs into the lungs every 6 (six) hours as needed for wheezing or shortness of breath.          Marland Kitchen aspirin 81 MG chewable tablet   Oral   Chew 81 mg by mouth every morning.         Marland Kitchen atorvastatin (LIPITOR) 40 MG tablet   Oral   Take 40 mg by mouth at bedtime.          . cloNIDine (CATAPRES - DOSED IN MG/24 HR) 0.2 mg/24hr patch   Transdermal   Place 1 patch onto the skin once a week. On Monday         . Fluticasone-Salmeterol (ADVAIR) 250-50 MCG/DOSE AEPB   Inhalation   Inhale 1 puff into the lungs 2 (two) times daily.         . furosemide (LASIX) 40 MG tablet   Oral   Take 1 tablet (40 mg total) by mouth 2 (two) times daily.   60 tablet   0   . HUMALOG KWIKPEN 100 UNIT/ML KiwkPen   Subcutaneous   Inject 15-25 Units into the skin every evening. At dinnertime           Dispense as written.   . insulin detemir (LEVEMIR) 100 UNIT/ML injection   Subcutaneous   Inject 40 Units into the skin at bedtime.          . isosorbide  dinitrate (ISORDIL) 10 MG tablet   Oral   Take 10 mg by mouth 3 (three) times daily as needed. For leg cramps         . metFORMIN (GLUCOPHAGE) 500 MG tablet   Oral   Take 500 mg by mouth 2 (two) times daily with a meal.         . metoprolol (LOPRESSOR) 100 MG tablet   Oral   Take 100 mg by mouth 2 (two) times daily.         Marland Kitchen SPIRIVA HANDIHALER 18 MCG inhalation capsule   Inhalation   Place 18 mcg into inhaler and inhale daily.           Dispense as written.     Allergies Penicillins  Family History  Problem Relation Age of Onset  . Hypertension Mother   . Heart disease Mother   . Diabetes Mother   . Cancer Father     Social History Social History  Substance Use Topics  . Smoking status: Current Every Day Smoker -- 0.33 packs/day    Types: Cigarettes  .  Smokeless tobacco: Never Used  . Alcohol Use: 0.0 oz/week    0 Standard drinks or equivalent per week     Comment: occassional    Review of Systems Constitutional: No fever/chills.  General malaise/myalgias Eyes: No visual changes. ENT: No sore throat. Cardiovascular: Denies chest pain. Respiratory: Severe shortness of breath, particularly with exertion Gastrointestinal: No abdominal pain.  No nausea, no vomiting.  No diarrhea.  No constipation. Genitourinary: Negative for dysuria. Musculoskeletal: Negative for back pain. Skin: Negative for rash. Neurological: Negative for headaches, focal weakness or numbness.  Generalized weakness/fatigue  10-point ROS otherwise negative.  ____________________________________________   PHYSICAL EXAM:  VITAL SIGNS: ED Triage Vitals  Enc Vitals Group     BP 12/28/14 1612 149/100 mmHg     Pulse Rate 12/28/14 1612 101     Resp 12/28/14 1612 22     Temp 12/28/14 1612 98.4 F (36.9 C)     Temp Source 12/28/14 1612 Oral     SpO2 12/28/14 1612 84 %     Weight 12/28/14 1612 287 lb (130.182 kg)     Height 12/28/14 1612 6' (1.829 m)     Head Cir --      Peak Flow --      Pain Score 12/28/14 1611 5     Pain Loc --      Pain Edu? --      Excl. in [place]? --     Constitutional: Alert and oriented.  Moderate respiratory distress, currently receiving a DuoNeb. Eyes: Conjunctivae are normal. PERRL. EOMI. Head: Atraumatic. Nose: No congestion/rhinnorhea. Mouth/Throat: Mucous membranes are moist.  Oropharynx non-erythematous. Neck: No stridor.   Cardiovascular: Borderline tachycardia, regular rhythm. Grossly normal heart sounds.  Good peripheral circulation. Respiratory: Increased respiratory effort including accessory muscle usage with tachypnea.  Expiratory wheezing and upper lung fields but nearly absent breath sounds in the mid and lower lungs [name] with deep inspiration. Gastrointestinal: Soft and nontender. No distention. No abdominal bruits. No  CVA tenderness. Musculoskeletal: Trace pitting edema in bilateral lower extremities.  No joint effusions. Neurologic:  Normal speech and language. No gross focal neurologic deficits are appreciated.  Skin:  Skin is warm, dry and intact. No rash noted. Psychiatric: Mood and affect are normal. Speech and behavior are normal.  ____________________________________________   LABS (all labs ordered are listed, but only abnormal results are displayed)  Labs Reviewed  CBC  WITH DIFFERENTIAL/PLATELET - Abnormal; Notable for the following:    RBC 6.06 (*)    HCT 52.1 (*)    MCHC 31.9 (*)    RDW 14.9 (*)    All other components within normal limits  BLOOD GAS, ARTERIAL - Abnormal; Notable for the following:    pH, Arterial 7.26 (*)    pCO2 arterial 89 (*)    Bicarbonate 39.9 (*)    Acid-Base Excess 8.2 (*)    Allens test (pass/fail) POSITIVE (*)    All other components within normal limits  BRAIN NATRIURETIC PEPTIDE  TROPONIN I  COMPREHENSIVE METABOLIC PANEL  MAGNESIUM   ____________________________________________  EKG  ED ECG REPORT I, Chrystie Hagwood, the attending physician, personally viewed and interpreted this ECG.   Date: 12/28/2014  EKG Time: 16:26  Rate: 99  Rhythm: Sinus rhythm with borderline tachycardia  Axis: Left axis deviation  Intervals:nonspecific intraventricular conduction delay  ST&T Change: Non-specific ST segment / T-wave changes, but no evidence of acute ischemia.  The morphology of the EKG is very consistent with his last EKG on record from 4 months ago.  ____________________________________________  RADIOLOGY   Dg Chest Portable 1 View  12/28/2014  CLINICAL DATA:  Cough and congestion. Body aches. Headache and weakness. Hypoxia. COPD. EXAM: PORTABLE CHEST 1 VIEW COMPARISON:  08/23/2014 FINDINGS: Stable scarring in both lungs with elevated left hemidiaphragm. Stably blunted left lateral costophrenic angle. Heart size within normal limits for AP  projection. Chronic fullness of the left hilum, likely vascular. IMPRESSION: 1. Stable chronic scarring in both lungs. This likely relates to the patient's history of sarcoidosis. Elevated left hemidiaphragm with chronic blunting of the left costophrenic angle. No significant new imaging findings. Electronically Signed   By: Van Clines M.D.   On: 12/28/2014 16:57    ____________________________________________   PROCEDURES  Procedure(s) performed: None  Critical Care performed: Yes, see critical care note(s)   CRITICAL CARE Performed by: Hinda Kehr   Total critical care time: 45 minutes  Critical care time was exclusive of separately billable procedures and treating other patients.  Critical care was necessary to treat or prevent imminent or life-threatening deterioration.  Critical care was time spent personally by me on the following activities: development of treatment plan with patient and/or surrogate as well as nursing, discussions with consultants, evaluation of patient's response to treatment, examination of patient, obtaining history from patient or surrogate, ordering and performing treatments and interventions, ordering and review of laboratory studies, ordering and review of radiographic studies, pulse oximetry and re-evaluation of patient's condition.  ____________________________________________   INITIAL IMPRESSION / ASSESSMENT AND PLAN / ED COURSE  Pertinent labs & imaging results that were available during my care of the patient were reviewed by me and considered in my medical decision making (see chart for details).  The patient is in severe respiratory distress with hypoxemia and moving minimal air based on auscultation.  We will start DuoNeb since and Solu-Medrol given that this may help given his history of COPD.  I will monitor closely for the need for BiPAP.  ----------------------------------------- 5:19 PM on  12/28/2014 -----------------------------------------  Patient's PCO2 is nearly 90.  Patient is somnolent but easily arousable.  Starting on BiPAP for respiratory support.  ----------------------------------------- 6:10 PM on 12/28/2014 -----------------------------------------  Patient resting comfortably on BiPAP.  Oxygenation in the 90s.  We will admit medicine as we get the rest of his labs back including his troponin.  ----------------------------------------- 6:19 PM on 12/28/2014 -----------------------------------------  Patient still resting comfortably  on BiPAP.  Labs without acute abnormalities.  Troponin 0.04 likely demand ischemia.  Discussed with Dr. Posey Pronto for admission. ____________________________________________  FINAL CLINICAL IMPRESSION(S) / ED DIAGNOSES  Final diagnoses:  Acute respiratory failure with hypoxia and hypercarbia (HCC)  Acute exacerbation of chronic obstructive pulmonary disease (COPD) ([place])      NEW MEDICATIONS STARTED DURING THIS VISIT:  New Prescriptions   No medications on file     Hinda Kehr, MD 12/28/14 1820

## 2014-12-29 DIAGNOSIS — J9601 Acute respiratory failure with hypoxia: Secondary | ICD-10-CM

## 2014-12-29 DIAGNOSIS — D869 Sarcoidosis, unspecified: Secondary | ICD-10-CM

## 2014-12-29 DIAGNOSIS — J441 Chronic obstructive pulmonary disease with (acute) exacerbation: Principal | ICD-10-CM

## 2014-12-29 DIAGNOSIS — Z720 Tobacco use: Secondary | ICD-10-CM

## 2014-12-29 DIAGNOSIS — J9602 Acute respiratory failure with hypercapnia: Secondary | ICD-10-CM

## 2014-12-29 LAB — GLUCOSE, CAPILLARY
GLUCOSE-CAPILLARY: 184 mg/dL — AB (ref 65–99)
GLUCOSE-CAPILLARY: 110 mg/dL — AB (ref 65–99)
GLUCOSE-CAPILLARY: 144 mg/dL — AB (ref 65–99)
GLUCOSE-CAPILLARY: 201 mg/dL — AB (ref 65–99)
GLUCOSE-CAPILLARY: 199 mg/dL — AB (ref 65–99)
Glucose-Capillary: 219 mg/dL — ABNORMAL HIGH (ref 65–99)

## 2014-12-29 LAB — MRSA PCR SCREENING: MRSA by PCR: NEGATIVE

## 2014-12-29 MED ORDER — BUDESONIDE 0.25 MG/2ML IN SUSP: 0.2500 mg | Freq: Four times a day (QID) | RESPIRATORY_TRACT | Status: DC

## 2014-12-29 MED ORDER — METHYLPREDNISOLONE SODIUM SUCC 125 MG IJ SOLR
80.0000 mg | Freq: Two times a day (BID) | INTRAMUSCULAR | Status: DC
  Administered 2014-12-29 – 2014-12-30 (×2): 80 mg via INTRAVENOUS
  Filled 2014-12-29 (×2): qty 2

## 2014-12-29 MED ORDER — IPRATROPIUM-ALBUTEROL 0.5-2.5 (3) MG/3ML IN SOLN
3.0000 mL | Freq: Four times a day (QID) | RESPIRATORY_TRACT | Status: DC
  Administered 2014-12-29 – 2014-12-30 (×3): 3 mL via RESPIRATORY_TRACT
  Filled 2014-12-29 (×3): qty 3

## 2014-12-29 MED ORDER — INSULIN ASPART 100 UNIT/ML ~~LOC~~ SOLN
0.0000 [IU] | Freq: Every day | SUBCUTANEOUS | Status: DC
  Filled 2014-12-29: qty 3

## 2014-12-29 MED ORDER — BUDESONIDE 0.25 MG/2ML IN SUSP
0.2500 mg | Freq: Four times a day (QID) | RESPIRATORY_TRACT | Status: DC
  Administered 2014-12-29 – 2014-12-30 (×3): 0.25 mg via RESPIRATORY_TRACT
  Filled 2014-12-29 (×3): qty 2

## 2014-12-29 MED ORDER — INSULIN ASPART 100 UNIT/ML ~~LOC~~ SOLN
4.0000 [IU] | Freq: Three times a day (TID) | SUBCUTANEOUS | Status: DC
  Administered 2014-12-30: 09:00:00 via SUBCUTANEOUS
  Filled 2014-12-29: qty 4

## 2014-12-29 MED ORDER — ALBUTEROL SULFATE (2.5 MG/3ML) 0.083% IN NEBU
2.5000 mg | INHALATION_SOLUTION | RESPIRATORY_TRACT | Status: DC | PRN
  Administered 2014-12-29: 2.5 mg via RESPIRATORY_TRACT
  Filled 2014-12-29: qty 3

## 2014-12-29 MED ORDER — INSULIN ASPART 100 UNIT/ML ~~LOC~~ SOLN
0.0000 [IU] | Freq: Three times a day (TID) | SUBCUTANEOUS | Status: DC
  Administered 2014-12-29: 7 [IU] via SUBCUTANEOUS
  Administered 2014-12-30: 2 [IU] via SUBCUTANEOUS
  Administered 2014-12-30: 11 [IU] via SUBCUTANEOUS
  Filled 2014-12-29: qty 11
  Filled 2014-12-29: qty 7
  Filled 2014-12-29: qty 2
  Filled 2014-12-29: qty 1

## 2014-12-29 NOTE — Consult Note (Signed)
PULMONARY/CCM CONSULT NOTE  Requesting MD/Service: Konidena/[place] Date of admission: 12/28/14 Date of consult: 12/29/14 Reason for consultation: acute on chronic hypercarbic resp faiure  Pt Profile:  59 M smoker with OHS/OSA, sarcoidosis, smoker, COPD admitted to ICU/SDU for acute on chronic hypercarbic respiratory failure. Supported with BiPAP. He is followed by Dr [name] for the above chronic respiratory problems     HPI:  As above. Pt denies productive cough, hemoptysis, fever/chills/sweats, worsening LE edema. He admits to smoking 1/3 to 1/2 PPD. He is reported to be noncompliant with nocturnal CPAP. He is on disability for respiratory condition  Past Medical History  Diagnosis Date  . Hypertension   . Diabetes mellitus without complication ([place])   . COPD (chronic obstructive pulmonary disease) ([place])   . Hypercholesteremia unk  . Coronary artery disease   . Stroke ([place])   . Sleep apnea     does not use CPAP regularly.  . Sarcoidosis [facility])    Past Surgical History  Procedure Laterality Date  . Appendectomy      MEDICATIONS: reviewed  Social History   Social History  . Marital Status: Married    Spouse Name: N/A  . Number of Children: N/A  . Years of Education: N/A   Occupational History  . Not on file.   Social History Main Topics  . Smoking status: Current Every Day Smoker -- 0.33 packs/day    Types: Cigarettes  . Smokeless tobacco: Never Used  . Alcohol Use: 0.0 oz/week    0 Standard drinks or equivalent per week     Comment: occassional  . Drug Use: Yes    Special: Marijuana     Comment: occassionally  . Sexual Activity: Yes   Other Topics Concern  . Not on file   Social History Narrative    Family History  Problem Relation Age of Onset  . Hypertension Mother   . Heart disease Mother   . Diabetes Mother   . Cancer Father     ROS - as per HPI. Otherwise, a detailed ROS is N/C  Filed Vitals:   12/29/14 0911 12/29/14 1000 12/29/14 1100  12/29/14 1200  BP: 131/86 126/84 121/82   Pulse:  73 74 73  Temp:    97.7 F (36.5 C)  TempSrc:    Oral  Resp:  31 25 14   Height:      Weight:      SpO2:  98% 99% 97%    EXAM:   Gen: Obese, NAD, RASS -1, answers questions appropriately HEENT: All WNL Neck: Very thick neck, JVP cannot be visualized, no LAN, no TM Lungs: Markedly diminished BS, normal percussion note throughout, no wheezes Cardiovascular: Reg rate, normal rhythm, no M noted Abdomen: Obese, soft, NT +BS Ext: trace symmetric pretibial edema Neuro: CNs intact, motor/sens grossly intact   DATA:  CBC Latest Ref Rng 12/28/2014 08/25/2014 08/23/2014  WBC 3.8 - 10.6 K/uL 7.3 9.8 14.0(H)  Hemoglobin 13.0 - 18.0 g/dL 16.6 14.6 15.3  Hematocrit 40.0 - 52.0 % 52.1(H) 45.6 48.1  Platelets 150 - 440 K/uL 182 180 189    CMP Latest Ref Rng 12/28/2014 08/26/2014 08/25/2014  Glucose 65 - 99 mg/dL 231(H) 155(H) 152(H)  BUN 6 - 20 mg/dL 13 18 22(H)  Creatinine 0.61 - 1.24 mg/dL 1.23 1.17 1.41(H)  Sodium 135 - 145 mmol/L 142 137 142  Potassium 3.5 - 5.1 mmol/L 4.3 4.3 4.0  Chloride 101 - 111 mmol/L 101 94(L) 90(L)  CO2 22 - 32 mmol/L 35(H) 38(H)  47(H)  Calcium 8.9 - 10.3 mg/dL 8.6(L) 8.0(L) 8.6(L)  Total Protein 6.5 - 8.1 g/dL 6.8 - -  Total Bilirubin 0.3 - 1.2 mg/dL 0.6 - -  Alkaline Phos 38 - 126 U/L 83 - -  AST 15 - 41 U/L 26 - -  ALT 17 - 63 U/L 29 - -    CXR: Chronic fibrocavitary changes     IMPRESSION:   Acute on chronic hypercarbic and hypoxemic respiratory failure OHS/OSA COPD due to smoking + sarcoidosis DM 2  PLAN:  Cont NPPV - changed to PRN Continue nebulized bronchodilators I have added nebulized steroids and adjusted dose of systemic steroids  I have adjusted insulin regimen in anticipation of worsening control on increased steroids Careful O2 titration to maintain SpO2 88-94% I have counseled re: smoking cessation Discussed with Dr Cleda Mccreedy, MD PCCM service Mobile  [phone] Pager [phone]

## 2014-12-29 NOTE — Care Management Note (Signed)
Case Management Note  Patient Details  Name: [name] MRN: [id number] Date of Birth: 1970/07/20  Subjective/Objective:  Admitted with dyspnea, respiratory failure and COPD exacerbation. Currently on BiPAP. Attempted to see patient but he is asleep and difficult to arouse. It is noted that patient lives at home with his spouse. PCP is [facility].                 Action/Plan: Will monitor progression.   Expected Discharge Date:                  Expected Discharge Plan:     In-House Referral:     Discharge planning Services     Post Acute Care Choice:    Choice offered to:     DME Arranged:    DME Agency:     HH Arranged:    HH Agency:     Status of Service:  In process, will continue to follow  Medicare Important Message Given:    Date Medicare IM Given:    Medicare IM give by:    Date Additional Medicare IM Given:    Additional Medicare Important Message give by:     If discussed at [place] of Stay Meetings, dates discussed:    Additional Comments:  Jolly Mango, RN 12/29/2014, 10:17 AM

## 2014-12-29 NOTE — Plan of Care (Signed)
Problem: Phase I Progression Outcomes Goal: Discharge plan established Outcome: Progressing Partial plan in place to have patient take BiPAP home for maintenance of oxygenation and ventilation.

## 2014-12-29 NOTE — Progress Notes (Signed)
[place] [facility] [place] NAME: [name]    MR#:  [id number]  DATE OF BIRTH:  1970/03/01  SUBJECTIVE: 44 year old admitted for sarcoid flare with hypercapnic respiratory failure. Patient received BiPAP. Respiratory acidosis with pH 7.26 and PCO2 89 on admission. THE BiPAP just now, he feels much better. Less headache. Requesting BiPAP at home.   CHIEF COMPLAINT:   Chief Complaint  Patient presents with  . Cough  . Nasal Congestion  . Generalized Body Aches    REVIEW OF SYSTEMS:   ROS CONSTITUTIONAL: No fever, fatigue or weakness.  EYES: No blurred or double vision.  EARS, NOSE, AND THROAT: No tinnitus or ear pain.  RESPIRATORY: Wheezing present.  CARDIOVASCULAR: No chest pain, orthopnea, edema.  GASTROINTESTINAL: No nausea, vomiting, diarrhea or abdominal pain.  GENITOURINARY: No dysuria, hematuria.  ENDOCRINE: No polyuria, nocturia,  HEMATOLOGY: No anemia, easy bruising or bleeding SKIN: No rash or lesion. MUSCULOSKELETAL: No joint pain or arthritis.   NEUROLOGIC: No tingling, numbness, weakness.  PSYCHIATRY: No anxiety or depression.   DRUG ALLERGIES:   Allergies  Allergen Reactions  . Penicillins Anaphylaxis and Other (See Comments)    Unable to obtain enough information to answer additional questions about this medication.      VITALS:  Blood pressure 121/82, pulse 73, temperature 97.7 F (36.5 C), temperature source Oral, resp. rate 14, height 6' (1.829 m), weight 130.182 kg (287 lb), SpO2 97 %.  PHYSICAL EXAMINATION:  GENERAL:  44 y.o.-year-old patient lying in the bed with no acute distress.  EYES: Pupils equal, round, reactive to light and accommodation. No scleral icterus. Extraocular muscles intact.  HEENT: Head atraumatic, normocephalic. Oropharynx and nasopharynx clear.  NECK:  Supple, no jugular venous distention. No thyroid enlargement, no tenderness.  LUNGS: faint  expiratory lung fields.  CARDIOVASCULAR: S1, S2 normal. No murmurs, rubs, or gallops.  ABDOMEN: Soft, nontender, nondistended. Bowel sounds present. No organomegaly or mass.  EXTREMITIES: No pedal edema, cyanosis, or clubbing.  NEUROLOGIC: Cranial nerves II through XII are intact. Muscle strength 5/5 in all extremities. Sensation intact. Gait not checked.  PSYCHIATRIC: The patient is alert and oriented x 3.  SKIN: No obvious rash, lesion, or ulcer.    LABORATORY PANEL:   CBC  Recent Labs Lab 12/28/14 1639  WBC 7.3  HGB 16.6  HCT 52.1*  PLT 182   ------------------------------------------------------------------------------------------------------------------  Chemistries   Recent Labs Lab 12/28/14 1639  NA 142  K 4.3  CL 101  CO2 35*  GLUCOSE 231*  BUN 13  CREATININE 1.23  CALCIUM 8.6*  MG 1.9  AST 26  ALT 29  ALKPHOS 83  BILITOT 0.6   ------------------------------------------------------------------------------------------------------------------  Cardiac Enzymes  Recent Labs Lab 12/28/14 1639  TROPONINI 0.04*   ------------------------------------------------------------------------------------------------------------------  RADIOLOGY:  Dg Chest Portable 1 View  12/28/2014  CLINICAL DATA:  Cough and congestion. Body aches. Headache and weakness. Hypoxia. COPD. EXAM: PORTABLE CHEST 1 VIEW COMPARISON:  08/23/2014 FINDINGS: Stable scarring in both lungs with elevated left hemidiaphragm. Stably blunted left lateral costophrenic angle. Heart size within normal limits for AP projection. Chronic fullness of the left hilum, likely vascular. IMPRESSION: 1. Stable chronic scarring in both lungs. This likely relates to the patient's history of sarcoidosis. Elevated left hemidiaphragm with chronic blunting of the left costophrenic angle. No significant new imaging findings. Electronically Signed   By: Van Clines M.D.   On: 12/28/2014 16:57    EKG:   Orders placed or performed during  the  hospital encounter of 08/24/14  . ED EKG within 10 minutes  . ED EKG within 10 minutes  . EKG    ASSESSMENT AND PLAN:   #1 acute on chronic respiratory failure; hypercapnic respiratory failure: Improved with BiPAP. Patient needs oxygen to keep sats 88-92%. Does have underlying sarcoid /copd,so saturations  on the lower side to keep permissive hypoxia and prevent recurrent hypercapnia / 2. COPD flare: Patient advised to quit smoking. Continue Solu-Medrol, nebulizers. TCardiac Nebulizer 3 diabetes mellitus type 2 metformin, bed, mealtime insulin, sliding-scale coverage. #4 sleep apnea continue CPAP, patient requesting BiPAP to prevent recurrent hypercapnia as: #4 hypertension ;controlled  All the records are reviewed and case discussed with Care Management/Social Workerr. Management plans discussed with the patient, family and they are in agreement.  CODE STATUS: full  TOTAL TIME TAKING CARE OF THIS PATIENT:35 min POSSIBLE D/C IN 1-2 DAYS, DEPENDING ON CLINICAL CONDITION.   Epifanio Lesches M.D on 12/29/2014 at 1:58 PM  Between 7am to 6pm - Pager - [phone]  After 6pm go to [email or web address] - password [email or web address] [facility]  Office  [phone]  CC: Primary care physician; [facility]   Note: This dictation was prepared with Dragon dictation along with smaller phrase technology. Any transcriptional errors that result from this process are unintentional.

## 2014-12-29 NOTE — Plan of Care (Signed)
Problem: ICU Phase Progression Outcomes Goal: Flu/PneumoVaccines if indicated Outcome: Not Met (add Reason) Patient refused.

## 2014-12-30 DIAGNOSIS — E662 Morbid (severe) obesity with alveolar hypoventilation: Secondary | ICD-10-CM

## 2014-12-30 DIAGNOSIS — G4733 Obstructive sleep apnea (adult) (pediatric): Secondary | ICD-10-CM

## 2014-12-30 LAB — GLUCOSE, CAPILLARY
Glucose-Capillary: 245 mg/dL — ABNORMAL HIGH (ref 65–99)
Glucose-Capillary: 300 mg/dL — ABNORMAL HIGH (ref 65–99)

## 2014-12-30 MED ORDER — PREDNISONE 20 MG PO TABS: 20.0000 mg | ORAL_TABLET | Freq: Every day | ORAL | Status: DC

## 2014-12-30 NOTE — Discharge Summary (Signed)
[name], is a 44 y.o. male  DOB [birth year]  MRN [id number].  Admission date:  12/28/2014  Admitting Physician  Fritzi Mandes, MD  Discharge Date:  12/30/2014   Primary MD  [facility]  Recommendations for primary care physician for things to follow:   Follow-up with Dr. [name] in 2-3 weeks.  Admission Diagnosis  Acute exacerbation of chronic obstructive pulmonary disease (COPD) (HCC) [J44.1] Acute respiratory failure with hypoxia and hypercarbia (HCC) [J96.01, J96.02]   Discharge Diagnosis  Acute exacerbation of chronic obstructive pulmonary disease (COPD) ([place]) [J44.1] Acute respiratory failure with hypoxia and hypercarbia (HCC) [J96.01, J96.02]    Active Problems:   Acute respiratory failure with hypoxia and hypercarbia (HCC)      Past Medical History  Diagnosis Date  . Hypertension   . Diabetes mellitus without complication ([place])   . COPD (chronic obstructive pulmonary disease) ([place])   . Hypercholesteremia unk  . Coronary artery disease   . Stroke ([place])   . Sleep apnea     does not use CPAP regularly.  . Sarcoidosis [facility])     Past Surgical History  Procedure Laterality Date  . Appendectomy         History of present illness and  Hospital Course:     Kindly see H&P for history of present illness and admission details, please review complete Labs, Consult reports and Test reports for all details in brief  HPI  from the history and physical done on the day of admission Patient 44 year old male with history of hypertension, diabetes, sleep apnea or CPAP at home comes in with lethargy, shortness of breath, headache. Found to have hypercapnic respiratory failure with CO2 89, admitted to ICU started on BiPAP. Just the CBC and admitted normal chest x-ray did not show any  pneumonia.   Hospital Course   #1 acute hypercapnic respiratory failure likely secondary to COPD exacerbation: Symptoms improved with the BiPAP. He also was given oxygen, Solu-Medrol. Patient has oxygen at home advised him to keep sats around 80-90. And the I advised him to follow up with Dr. [name] in 2-3 weeks. Patient requested BiPAP machine instead CPAP. At night Dr. [name] can follow up on that. Patient continues to smoke advised him to quit. #2 hypertension: Controlled continue home medications. #3 diabetes mellitus type 2: Continue metformin as well as Lantus. 4. Sleep apnea continue CPAP   Discharge Condition: stable  Follow UP      Discharge Instructions  and  Discharge Medications       Medication List    TAKE these medications        albuterol 108 (90 BASE) MCG/ACT inhaler  Commonly known as:  PROVENTIL HFA;VENTOLIN HFA  Inhale 2 puffs into the lungs every 6 (six) hours as needed for wheezing or shortness of breath.     aspirin 81 MG chewable tablet  Chew 81 mg by mouth daily.     atorvastatin 40 MG tablet  Commonly known as:  LIPITOR  Take 40 mg by mouth at bedtime.     cloNIDine 0.2 mg/24hr patch  Commonly known as:  CATAPRES - Dosed in mg/24 hr  Place 0.2 mg onto the skin once a week.     Fluticasone-Salmeterol 250-50 MCG/DOSE Aepb  Commonly known as:  ADVAIR  Inhale 1 puff into the lungs 2 (two) times daily.     furosemide 40 MG tablet  Commonly known as:  LASIX  Take 1 tablet (40 mg total) by mouth  2 (two) times daily.     insulin detemir 100 UNIT/ML injection  Commonly known as:  LEVEMIR  Inject 40 Units into the skin at bedtime.     insulin lispro 100 UNIT/ML injection  Commonly known as:  HUMALOG  Inject 8 Units into the skin every evening.     isosorbide dinitrate 10 MG tablet  Commonly known as:  ISORDIL  Take 10 mg by mouth 3 (three) times daily.     lisinopril 10 MG tablet  Commonly known as:  PRINIVIL,ZESTRIL  Take 10 mg by  mouth daily.     metFORMIN 500 MG 24 hr tablet  Commonly known as:  GLUCOPHAGE-XR  Take 1,000 mg by mouth 2 (two) times daily.     metoprolol 100 MG tablet  Commonly known as:  LOPRESSOR  Take 100 mg by mouth 2 (two) times daily.     predniSONE 20 MG tablet  Commonly known as:  DELTASONE  Take 1 tablet (20 mg total) by mouth daily with breakfast.     tiotropium 18 MCG inhalation capsule  Commonly known as:  SPIRIVA  Place 18 mcg into inhaler and inhale daily.          Diet and Activity recommendation: See Discharge Instructions above   Consults obtained - Critical care and pulmonary   Major procedures and Radiology Reports - PLEASE review detailed and final reports for all details, in brief -     Dg Chest Portable 1 View  12/28/2014  CLINICAL DATA:  Cough and congestion. Body aches. Headache and weakness. Hypoxia. COPD. EXAM: PORTABLE CHEST 1 VIEW COMPARISON:  08/23/2014 FINDINGS: Stable scarring in both lungs with elevated left hemidiaphragm. Stably blunted left lateral costophrenic angle. Heart size within normal limits for AP projection. Chronic fullness of the left hilum, likely vascular. IMPRESSION: 1. Stable chronic scarring in both lungs. This likely relates to the patient's history of sarcoidosis. Elevated left hemidiaphragm with chronic blunting of the left costophrenic angle. No significant new imaging findings. Electronically Signed   By: Van Clines M.D.   On: 12/28/2014 16:57    Micro Results     Recent Results (from the past 240 hour(s))  MRSA PCR Screening     Status: None   Collection Time: 12/29/14  3:05 AM  Result Value Ref Range Status   MRSA by PCR NEGATIVE NEGATIVE Final    Comment:        The GeneXpert MRSA Assay (FDA approved for NASAL specimens only), is one component of a comprehensive MRSA colonization surveillance program. It is not intended to diagnose MRSA infection nor to guide or monitor treatment for MRSA infections.         Today   Subjective:   [name] today has no headache,no chest abdominal pain,no new weakness tingling or numbness, feels much better wants to go home today.   Objective:   Blood pressure 128/89, pulse 85, temperature 98.3 F (36.8 C), temperature source Oral, resp. rate 43, height 6' (1.829 m), weight 130.182 kg (287 lb), SpO2 96 %.   Intake/Output Summary (Last 24 hours) at 12/30/14 1216 Last data filed at 12/30/14 0900  Gross per 24 hour  Intake    960 ml  Output   1650 ml  Net   -690 ml    Exam Awake Alert, Oriented x 3, No new F.N deficits, Normal affect [place].AT,PERRAL Supple Neck,No JVD, No cervical lymphadenopathy appriciated.  Symmetrical Chest wall movement, Good air movement bilaterally, CTAB RRR,No Gallops,Rubs or new Murmurs,  No Parasternal Heave +ve B.Sounds, Abd Soft, Non tender, No organomegaly appriciated, No rebound -guarding or rigidity. No Cyanosis, Clubbing or edema, No new Rash or bruise  Data Review   CBC w Diff: Lab Results  Component Value Date   WBC 7.3 12/28/2014   WBC 9.3 05/28/2014   HGB 16.6 12/28/2014   HGB 15.1 05/28/2014   HCT 52.1* 12/28/2014   HCT 45.2 05/28/2014   PLT 182 12/28/2014   PLT 185 05/28/2014   LYMPHOPCT 15 12/28/2014   LYMPHOPCT 18.6 05/28/2014   MONOPCT 14 12/28/2014   MONOPCT 15.8 05/28/2014   EOSPCT 0 12/28/2014   EOSPCT 1.3 05/28/2014   BASOPCT 0 12/28/2014   BASOPCT 0.3 05/28/2014    CMP: Lab Results  Component Value Date   NA 142 12/28/2014   NA 140 05/28/2014   K 4.3 12/28/2014   K 3.9 05/28/2014   CL 101 12/28/2014   CL 105 05/28/2014   CO2 35* 12/28/2014   CO2 30 05/28/2014   BUN 13 12/28/2014   BUN 12 05/28/2014   CREATININE 1.23 12/28/2014   CREATININE 1.20 05/28/2014   PROT 6.8 12/28/2014   PROT 8.4* 06/01/2013   ALBUMIN 3.5 12/28/2014   ALBUMIN 3.8 06/01/2013   BILITOT 0.6 12/28/2014   BILITOT 0.7 06/01/2013   ALKPHOS 83 12/28/2014   ALKPHOS 88 06/01/2013   AST 26  12/28/2014   AST 24 06/01/2013   ALT 29 12/28/2014   ALT 54 06/01/2013  .   Total Time in preparing paper work, data evaluation and todays exam - 55 minutes  Gillian Kluever M.D on 12/30/2014 at 12:16 PM    Note: This dictation was prepared with Dragon dictation along with smaller phrase technology. Any transcriptional errors that result from this process are unintentional.

## 2014-12-30 NOTE — Progress Notes (Signed)
Pt woke up at 2:45am and had removed himself from the monitor and came walking out of his room and came to the front desk stating he was either in a dream or he was confused about where he was.  He said he wasn't sure where he was but thought he was in a studio and we stated you are in the hospital.  We then got him back to his room and placed him back on the monitor and he called his wife and she confirmed he was in the hospital.  Respiratory came in because it was time for his treatments,  He received his tx and was no longer confused and then patient was placed back on bipap.

## 2014-12-30 NOTE — Consult Note (Addendum)
PULMONARY/CCM CONSULT NOTE  Requesting MD/Service: Kon[place] Date of admission: 12/28/14 Date of consult: 12/29/14 Reason for consultation: acute on chronic hypercarbic resp faiure  Pt Profile:  46 M smoker with OHS/OSA, sarcoidosis, smoker, COPD admitted to ICU/SDU for acute on chronic hypercarbic respiratory failure. Supported with BiPAP. He is followed by Dr [name] for the above chronic respiratory problems    HPI:  As above. Pt denies productive cough, hemoptysis, fever/chills/sweats, worsening LE edema. He admits to smoking 1/3 to 1/2 PPD. He is reported to be noncompliant with nocturnal CPAP. He is on disability for respiratory condition  SUBJECTIVE: Doing well today, had a mild episode of confusion lastnight, fully awake and alert this morning, tolerated bipap lastnight, stable for d\c home today  Past Medical History  Diagnosis Date  . Hypertension   . Diabetes mellitus without complication ([place])   . COPD (chronic obstructive pulmonary disease) ([place])   . Hypercholesteremia unk  . Coronary artery disease   . Stroke ([place])   . Sleep apnea     does not use CPAP regularly.  . Sarcoidosis [facility])    Past Surgical History  Procedure Laterality Date  . Appendectomy      MEDICATIONS: reviewed  Social History   Social History  . Marital Status: Married    Spouse Name: N/A  . Number of Children: N/A  . Years of Education: N/A   Occupational History  . Not on file.   Social History Main Topics  . Smoking status: Current Every Day Smoker -- 0.33 packs/day    Types: Cigarettes  . Smokeless tobacco: Never Used  . Alcohol Use: 0.0 oz/week    0 Standard drinks or equivalent per week     Comment: occassional  . Drug Use: Yes    Special: Marijuana     Comment: occassionally  . Sexual Activity: Yes   Other Topics Concern  . Not on file   Social History Narrative    Family History  Problem Relation Age of Onset  . Hypertension Mother   . Heart disease Mother    . Diabetes Mother   . Cancer Father     ROS - as per HPI. Otherwise, a detailed ROS is N/C  Filed Vitals:   12/30/14 0500 12/30/14 0600 12/30/14 0700 12/30/14 0800  BP: 130/84 129/83 129/87 126/84  Pulse: 80 78 79 79  Temp:      TempSrc:      Resp: 30 25 36 34  Height:      Weight:      SpO2: 97% 96% 96% 96%    EXAM:   Gen: Obese, NAD, RASS -1, answers questions appropriately HEENT: All WNL Neck: Very thick neck, JVP cannot be visualized, no LAN, no TM Lungs: Markedly diminished BS, normal percussion note throughout, no wheezes (improved) Cardiovascular: Reg rate, normal rhythm, no M noted Abdomen: Obese, soft, NT +BS Ext: trace symmetric pretibial edema Neuro: CNs intact, motor/sens grossly intact   DATA:  CBC Latest Ref Rng 12/28/2014 08/25/2014 08/23/2014  WBC 3.8 - 10.6 K/uL 7.3 9.8 14.0(H)  Hemoglobin 13.0 - 18.0 g/dL 16.6 14.6 15.3  Hematocrit 40.0 - 52.0 % 52.1(H) 45.6 48.1  Platelets 150 - 440 K/uL 182 180 189    CMP Latest Ref Rng 12/28/2014 08/26/2014 08/25/2014  Glucose 65 - 99 mg/dL 231(H) 155(H) 152(H)  BUN 6 - 20 mg/dL 13 18 22(H)  Creatinine 0.61 - 1.24 mg/dL 1.23 1.17 1.41(H)  Sodium 135 - 145 mmol/L 142 137 142  Potassium 3.5 -  5.1 mmol/L 4.3 4.3 4.0  Chloride 101 - 111 mmol/L 101 94(L) 90(L)  CO2 22 - 32 mmol/L 35(H) 38(H) 47(H)  Calcium 8.9 - 10.3 mg/dL 8.6(L) 8.0(L) 8.6(L)  Total Protein 6.5 - 8.1 g/dL 6.8 - -  Total Bilirubin 0.3 - 1.2 mg/dL 0.6 - -  Alkaline Phos 38 - 126 U/L 83 - -  AST 15 - 41 U/L 26 - -  ALT 17 - 63 U/L 29 - -    CXR: Chronic fibrocavitary changes     IMPRESSION:   Acute on chronic hypercarbic and hypoxemic respiratory failure OHS/OSA COPD due to smoking + sarcoidosis DM 2  PLAN:  Bipap tolerated well - may require another sleep study as an outpatient given OSA\OHS with continued CO2 retention, primary pulmonologist to address and setup of bipap.  Currently has CPAP at home.  Resume home inhaler Taper systemic  steroids over 2 weeks Cont with supplemental O2 to maintain sats >88% Stable to discharge counseled re: smoking cessation Discussed with Dr Vianne Bulls  Follow up with Dr. [name] in 2-3 weeks   Pulmonary consult time - 84mins  Vilinda Boehringer, MD [place] [facility] Pager [phone] (please enter 7-digits) On Call Pager - [phone] (please enter 7-digits)

## 2015-01-20 ENCOUNTER — Ambulatory Visit: Payer: Medicare Other | Attending: Specialist

## 2015-01-20 DIAGNOSIS — E669 Obesity, unspecified: Secondary | ICD-10-CM | POA: Diagnosis present

## 2015-01-20 DIAGNOSIS — G4733 Obstructive sleep apnea (adult) (pediatric): Secondary | ICD-10-CM | POA: Diagnosis not present

## 2015-02-01 ENCOUNTER — Ambulatory Visit: Payer: Medicare Other

## 2015-02-09 ENCOUNTER — Emergency Department
Admission: EM | Admit: 2015-02-09 | Discharge: 2015-02-09 | Disposition: A | Discharge status: Home / Self Care | Payer: Medicare Other | Source: Home / Self Care | Attending: Emergency Medicine | Admitting: Emergency Medicine

## 2015-02-09 ENCOUNTER — Emergency Department: Payer: Medicare Other

## 2015-02-09 ENCOUNTER — Encounter: Payer: Self-pay | Admitting: Emergency Medicine

## 2015-02-09 DIAGNOSIS — J441 Chronic obstructive pulmonary disease with (acute) exacerbation: Secondary | ICD-10-CM

## 2015-02-09 DIAGNOSIS — Z794 Long term (current) use of insulin: Secondary | ICD-10-CM

## 2015-02-09 DIAGNOSIS — Z79899 Other long term (current) drug therapy: Secondary | ICD-10-CM | POA: Insufficient documentation

## 2015-02-09 DIAGNOSIS — Z7951 Long term (current) use of inhaled steroids: Secondary | ICD-10-CM

## 2015-02-09 DIAGNOSIS — Z7984 Long term (current) use of oral hypoglycemic drugs: Secondary | ICD-10-CM | POA: Insufficient documentation

## 2015-02-09 DIAGNOSIS — E119 Type 2 diabetes mellitus without complications: Secondary | ICD-10-CM | POA: Insufficient documentation

## 2015-02-09 DIAGNOSIS — I10 Essential (primary) hypertension: Secondary | ICD-10-CM | POA: Insufficient documentation

## 2015-02-09 DIAGNOSIS — Z7952 Long term (current) use of systemic steroids: Secondary | ICD-10-CM | POA: Insufficient documentation

## 2015-02-09 DIAGNOSIS — Z7982 Long term (current) use of aspirin: Secondary | ICD-10-CM | POA: Insufficient documentation

## 2015-02-09 DIAGNOSIS — F17210 Nicotine dependence, cigarettes, uncomplicated: Secondary | ICD-10-CM | POA: Insufficient documentation

## 2015-02-09 DIAGNOSIS — Z880 Allergy status to penicillin: Secondary | ICD-10-CM

## 2015-02-09 DIAGNOSIS — J209 Acute bronchitis, unspecified: Secondary | ICD-10-CM

## 2015-02-09 LAB — BASIC METABOLIC PANEL
ANION GAP: 2 — AB (ref 5–15)
BUN: 16 mg/dL (ref 6–20)
CHLORIDE: 104 mmol/L (ref 101–111)
CO2: 36 mmol/L — AB (ref 22–32)
Calcium: 8.4 mg/dL — ABNORMAL LOW (ref 8.9–10.3)
Creatinine, Ser: 1.35 mg/dL — ABNORMAL HIGH (ref 0.61–1.24)
GFR calc non Af Amer: >60 mL/min (ref >60)
Glucose, Bld: 118 mg/dL — ABNORMAL HIGH (ref 65–99)
Potassium: 4.2 mmol/L (ref 3.5–5.1)
Sodium: 142 mmol/L (ref 135–145)

## 2015-02-09 LAB — BRAIN NATRIURETIC PEPTIDE: B Natriuretic Peptide: 44 pg/mL (ref 0.0–100.0)

## 2015-02-09 LAB — CBC
HEMATOCRIT: 52.1 % — AB (ref 40.0–52.0)
Hemoglobin: 16.5 g/dL (ref 13.0–18.0)
MCH: 26.6 pg (ref 26.0–34.0)
MCHC: 31.7 g/dL — ABNORMAL LOW (ref 32.0–36.0)
MCV: 83.9 fL (ref 80.0–100.0)
PLATELETS: 171 10*3/uL (ref 150–440)
RBC: 6.21 MIL/uL — AB (ref 4.40–5.90)
RDW: 14.9 % — ABNORMAL HIGH (ref 11.5–14.5)
WBC: 4.9 10*3/uL (ref 3.8–10.6)

## 2015-02-09 LAB — TROPONIN I: Troponin I: <0.03 ng/mL (ref <0.031)

## 2015-02-09 MED ORDER — AZITHROMYCIN 250 MG PO TABS: ORAL_TABLET | ORAL | Status: DC

## 2015-02-09 MED ORDER — ALBUTEROL SULFATE HFA 108 (90 BASE) MCG/ACT IN AERS: 2.0000 | INHALATION_SPRAY | Freq: Four times a day (QID) | RESPIRATORY_TRACT | Status: DC | PRN

## 2015-02-09 MED ORDER — IPRATROPIUM-ALBUTEROL 0.5-2.5 (3) MG/3ML IN SOLN
3.0000 mL | Freq: Once | RESPIRATORY_TRACT | Status: AC
  Administered 2015-02-09: 3 mL via RESPIRATORY_TRACT
  Filled 2015-02-09: qty 3

## 2015-02-09 MED ORDER — IPRATROPIUM-ALBUTEROL 0.5-2.5 (3) MG/3ML IN SOLN
RESPIRATORY_TRACT | Status: AC
  Filled 2015-02-09: qty 3

## 2015-02-09 MED ORDER — IPRATROPIUM-ALBUTEROL 0.5-2.5 (3) MG/3ML IN SOLN
3.0000 mL | Freq: Once | RESPIRATORY_TRACT | Status: AC
  Administered 2015-02-09: 3 mL via RESPIRATORY_TRACT

## 2015-02-09 MED ORDER — METHYLPREDNISOLONE SODIUM SUCC 125 MG IJ SOLR
125.0000 mg | Freq: Once | INTRAMUSCULAR | Status: AC
  Administered 2015-02-09: 125 mg via INTRAVENOUS
  Filled 2015-02-09: qty 2

## 2015-02-09 MED ORDER — PREDNISONE 20 MG PO TABS: 40.0000 mg | ORAL_TABLET | Freq: Every day | ORAL | Status: DC

## 2015-02-09 NOTE — Discharge Instructions (Signed)

## 2015-02-09 NOTE — ED Notes (Signed)
Pt presents with cough, shortness of breath and tightness in his chest started yesterday. Pt currently on home o2 at 1.5liters continous. Productive cough noted in triage.

## 2015-02-09 NOTE — ED Notes (Signed)
Reports cough for 2 days.  Pt a&o, skin w/d, no resp distress at this time.

## 2015-02-09 NOTE — ED Provider Notes (Signed)
[facility] Emergency Department Provider Note  Time seen: 8:04 AM  I have reviewed the triage vital signs and the nursing notes.   HISTORY  Chief Complaint Shortness of Breath; Cough; and Chest Pain    HPI [name] is a 45 y.o. male with a past medical history of COPD, hypertension, diabetes, hyperlipidemia, CVA, sarcoidosis, who presents the emergency department with difficulty breathing. According to the patient for the past 2 days he has had a cough, and feeling short of breath. States his shortness of breath worsened overnight so he came to the emergency department for evaluation. Patient has a history of sarcoidosis as well as COPD and wears 1. 5-2 liters of home O2 24/7. Also wears BiPAP/CPAP at night. Denies fever, chest pain although he does state some mild chest tightness. Denies leg pain or swelling.     Past Medical History  Diagnosis Date  . Hypertension   . Diabetes mellitus without complication ([place])   . COPD (chronic obstructive pulmonary disease) ([place])   . Hypercholesteremia unk  . Coronary artery disease   . Stroke ([place])   . Sleep apnea     does not use CPAP regularly.  . Sarcoidosis [facility])     Patient Active Problem List   Diagnosis Date Noted  . Acute respiratory failure with hypoxia and hypercarbia ([place]) 12/28/2014  . Diabetes ([place]) 10/19/2014  . Tobacco use 10/19/2014  . Chronic diastolic heart failure ([place]) 10/18/2014  . Chronic obstructive pulmonary disease ([place]) 10/18/2014  . Sleep apnea 10/18/2014  . Acute on chronic respiratory failure with hypoxia ([place]) 08/24/2014  . Acute and chronic respiratory failure 04/22/2013  . Sarcoidosis ([place]) 04/22/2013  . Other and unspecified hyperlipidemia 04/22/2013    Past Surgical History  Procedure Laterality Date  . Appendectomy      Current Outpatient Rx  Name  Route  Sig  Dispense  Refill  . albuterol (PROVENTIL HFA;VENTOLIN HFA) 108 (90 BASE) MCG/ACT inhaler  Inhalation   Inhale 2 puffs into the lungs every 6 (six) hours as needed for wheezing or shortness of breath.          Marland Kitchen aspirin 81 MG chewable tablet   Oral   Chew 81 mg by mouth daily.         Marland Kitchen atorvastatin (LIPITOR) 40 MG tablet   Oral   Take 40 mg by mouth at bedtime.          . cloNIDine (CATAPRES - DOSED IN MG/24 HR) 0.2 mg/24hr patch   Transdermal   Place 0.2 mg onto the skin once a week.         . Fluticasone-Salmeterol (ADVAIR) 250-50 MCG/DOSE AEPB   Inhalation   Inhale 1 puff into the lungs 2 (two) times daily.         . furosemide (LASIX) 40 MG tablet   Oral   Take 1 tablet (40 mg total) by mouth 2 (two) times daily.   60 tablet   0   . insulin detemir (LEVEMIR) 100 UNIT/ML injection   Subcutaneous   Inject 40 Units into the skin at bedtime.          . insulin lispro (HUMALOG) 100 UNIT/ML injection   Subcutaneous   Inject 8 Units into the skin every evening.         . isosorbide dinitrate (ISORDIL) 10 MG tablet   Oral   Take 10 mg by mouth 3 (three) times daily.         Marland Kitchen  lisinopril (PRINIVIL,ZESTRIL) 10 MG tablet   Oral   Take 10 mg by mouth daily.         . metFORMIN (GLUCOPHAGE-XR) 500 MG 24 hr tablet   Oral   Take 1,000 mg by mouth 2 (two) times daily.         . metoprolol (LOPRESSOR) 100 MG tablet   Oral   Take 100 mg by mouth 2 (two) times daily.         . predniSONE (DELTASONE) 20 MG tablet   Oral   Take 1 tablet (20 mg total) by mouth daily with breakfast.   6 tablet   0     20 mg po daily for 3 days 10 mg po daily for 3 da ...   . tiotropium (SPIRIVA) 18 MCG inhalation capsule   Inhalation   Place 18 mcg into inhaler and inhale daily.           Allergies Penicillins  Family History  Problem Relation Age of Onset  . Hypertension Mother   . Heart disease Mother   . Diabetes Mother   . Cancer Father     Social History Social History  Substance Use Topics  . Smoking status: Current Every Day Smoker  -- 0.33 packs/day    Types: Cigarettes  . Smokeless tobacco: Never Used  . Alcohol Use: 0.0 oz/week    0 Standard drinks or equivalent per week     Comment: occassional    Review of Systems Constitutional: Negative for fever. Cardiovascular: Negative for chest pain. Positive for chest tightness. Respiratory: Positive for shortness of breath overnight. Positive for cough 2 days. Gastrointestinal: Negative for abdominal pain, vomiting and diarrhea. Musculoskeletal: Negative for back pain. Neurological: Negative for headache 10-point ROS otherwise negative.  ____________________________________________   PHYSICAL EXAM:  VITAL SIGNS: ED Triage Vitals  Enc Vitals Group     BP 02/09/15 0725 125/87 mmHg     Pulse Rate 02/09/15 0725 80     Resp 02/09/15 0725 22     Temp 02/09/15 0725 99 F (37.2 C)     Temp Source 02/09/15 0725 Oral     SpO2 02/09/15 0725 90 %     Weight 02/09/15 0724 275 lb (124.739 kg)     Height 02/09/15 0724 6' (1.829 m)     Head Cir --      Peak Flow --      Pain Score 02/09/15 0723 8     Pain Loc --      Pain Edu? --      Excl. in [place]? --     Constitutional: Alert and oriented. Well appearing and in no distress. Eyes: Normal exam ENT   Head: Normocephalic and atraumatic.   Mouth/Throat: Mucous membranes are moist. Cardiovascular: Normal rate, regular rhythm. No murmur Respiratory: Normal respiratory effort. No tachypnea noted. Patient does have decreased breath sounds bilaterally with minimal wheeze. No rales or rhonchi. Gastrointestinal: Soft and nontender. No distention.   Musculoskeletal: Nontender with normal range of motion in all extremities. Neurologic:  Normal speech and language. No gross focal neurologic deficits  Skin:  Skin is warm, dry and intact.  Psychiatric: Mood and affect are normal. Speech and behavior are normal ____________________________________________    EKG  EKG reviewed and interpreted by myself shows normal  sinus rhythm at 80 bpm, widened QRS, nonspecific ST changes. Morphology consistent with bifascicular block. Patient's EKG is unchanged from previous 08/23/14.  ____________________________________________    RADIOLOGY  Chest x-ray shows chronic  scarring without acute abnormality  ____________________________________________    INITIAL IMPRESSION / ASSESSMENT AND PLAN / ED COURSE  Pertinent labs & imaging results that were available during my care of the patient were reviewed by me and considered in my medical decision making (see chart for details).  Patient presents for 2 days of cough, shortness of breath. Exam most consistent with an COPD exacerbation. Currently satting 90-92 percent on 2 L home O2. We will treat with Solu-Medrol, DuoNeb's, and monitor in the emergency department. Chest x-ray shows no signs of acute abnormality. We'll check labs.  Labs are within normal limits including negative troponin. Chest x-ray shows no acute abnormality. Oxygen saturation currently 96% on 2 L. Patient states he feels much better. We'll discharge on prednisone and Zithromax. The patient is to follow-up with his primary care physician 1-2 days for recheck. Discussed return precautions to which the patient is agreeable.  ____________________________________________   FINAL CLINICAL IMPRESSION(S) / ED DIAGNOSES  Dyspnea COPD exacerbation   Harvest Dark, MD 02/09/15 [phone]

## 2015-02-11 ENCOUNTER — Inpatient Hospital Stay: Payer: Medicare Other

## 2015-02-11 ENCOUNTER — Emergency Department: Payer: Medicare Other

## 2015-02-11 ENCOUNTER — Inpatient Hospital Stay
Admission: EM | Admit: 2015-02-11 | Discharge: 2015-02-21 | DRG: 207 | Disposition: A | Discharge status: Home Health | Payer: Medicare Other | Attending: Internal Medicine | Admitting: Internal Medicine

## 2015-02-11 DIAGNOSIS — I130 Hypertensive heart and chronic kidney disease with heart failure and stage 1 through stage 4 chronic kidney disease, or unspecified chronic kidney disease: Secondary | ICD-10-CM | POA: Diagnosis present

## 2015-02-11 DIAGNOSIS — D869 Sarcoidosis, unspecified: Secondary | ICD-10-CM | POA: Diagnosis present

## 2015-02-11 DIAGNOSIS — Z6841 Body Mass Index (BMI) 40.0 and over, adult: Secondary | ICD-10-CM

## 2015-02-11 DIAGNOSIS — J440 Chronic obstructive pulmonary disease with acute lower respiratory infection: Secondary | ICD-10-CM | POA: Diagnosis present

## 2015-02-11 DIAGNOSIS — G4733 Obstructive sleep apnea (adult) (pediatric): Secondary | ICD-10-CM | POA: Diagnosis present

## 2015-02-11 DIAGNOSIS — I5033 Acute on chronic diastolic (congestive) heart failure: Secondary | ICD-10-CM | POA: Diagnosis not present

## 2015-02-11 DIAGNOSIS — I2510 Atherosclerotic heart disease of native coronary artery without angina pectoris: Secondary | ICD-10-CM | POA: Diagnosis present

## 2015-02-11 DIAGNOSIS — Z794 Long term (current) use of insulin: Secondary | ICD-10-CM | POA: Diagnosis not present

## 2015-02-11 DIAGNOSIS — Z8673 Personal history of transient ischemic attack (TIA), and cerebral infarction without residual deficits: Secondary | ICD-10-CM

## 2015-02-11 DIAGNOSIS — F17210 Nicotine dependence, cigarettes, uncomplicated: Secondary | ICD-10-CM | POA: Diagnosis present

## 2015-02-11 DIAGNOSIS — J9601 Acute respiratory failure with hypoxia: Secondary | ICD-10-CM | POA: Diagnosis present

## 2015-02-11 DIAGNOSIS — Z880 Allergy status to penicillin: Secondary | ICD-10-CM | POA: Diagnosis not present

## 2015-02-11 DIAGNOSIS — E119 Type 2 diabetes mellitus without complications: Secondary | ICD-10-CM

## 2015-02-11 DIAGNOSIS — J209 Acute bronchitis, unspecified: Secondary | ICD-10-CM | POA: Diagnosis present

## 2015-02-11 DIAGNOSIS — N183 Chronic kidney disease, stage 3 (moderate): Secondary | ICD-10-CM | POA: Diagnosis present

## 2015-02-11 DIAGNOSIS — Z79899 Other long term (current) drug therapy: Secondary | ICD-10-CM | POA: Diagnosis not present

## 2015-02-11 DIAGNOSIS — J9621 Acute and chronic respiratory failure with hypoxia: Principal | ICD-10-CM | POA: Diagnosis present

## 2015-02-11 DIAGNOSIS — E785 Hyperlipidemia, unspecified: Secondary | ICD-10-CM | POA: Diagnosis present

## 2015-02-11 DIAGNOSIS — Z833 Family history of diabetes mellitus: Secondary | ICD-10-CM | POA: Diagnosis not present

## 2015-02-11 DIAGNOSIS — D696 Thrombocytopenia, unspecified: Secondary | ICD-10-CM | POA: Diagnosis present

## 2015-02-11 DIAGNOSIS — J189 Pneumonia, unspecified organism: Secondary | ICD-10-CM

## 2015-02-11 DIAGNOSIS — J9622 Acute and chronic respiratory failure with hypercapnia: Secondary | ICD-10-CM | POA: Diagnosis present

## 2015-02-11 DIAGNOSIS — Z9981 Dependence on supplemental oxygen: Secondary | ICD-10-CM | POA: Diagnosis not present

## 2015-02-11 DIAGNOSIS — R0600 Dyspnea, unspecified: Secondary | ICD-10-CM

## 2015-02-11 DIAGNOSIS — E1122 Type 2 diabetes mellitus with diabetic chronic kidney disease: Secondary | ICD-10-CM | POA: Diagnosis present

## 2015-02-11 DIAGNOSIS — J441 Chronic obstructive pulmonary disease with (acute) exacerbation: Secondary | ICD-10-CM | POA: Diagnosis present

## 2015-02-11 DIAGNOSIS — Z8249 Family history of ischemic heart disease and other diseases of the circulatory system: Secondary | ICD-10-CM | POA: Diagnosis not present

## 2015-02-11 DIAGNOSIS — Z4659 Encounter for fitting and adjustment of other gastrointestinal appliance and device: Secondary | ICD-10-CM

## 2015-02-11 DIAGNOSIS — E870 Hyperosmolality and hypernatremia: Secondary | ICD-10-CM | POA: Diagnosis not present

## 2015-02-11 DIAGNOSIS — N179 Acute kidney failure, unspecified: Secondary | ICD-10-CM | POA: Diagnosis present

## 2015-02-11 DIAGNOSIS — J9602 Acute respiratory failure with hypercapnia: Secondary | ICD-10-CM

## 2015-02-11 DIAGNOSIS — R4781 Slurred speech: Secondary | ICD-10-CM

## 2015-02-11 DIAGNOSIS — Z7982 Long term (current) use of aspirin: Secondary | ICD-10-CM

## 2015-02-11 DIAGNOSIS — J449 Chronic obstructive pulmonary disease, unspecified: Secondary | ICD-10-CM

## 2015-02-11 DIAGNOSIS — R0902 Hypoxemia: Secondary | ICD-10-CM | POA: Diagnosis present

## 2015-02-11 DIAGNOSIS — J9690 Respiratory failure, unspecified, unspecified whether with hypoxia or hypercapnia: Secondary | ICD-10-CM

## 2015-02-11 DIAGNOSIS — Z95828 Presence of other vascular implants and grafts: Secondary | ICD-10-CM

## 2015-02-11 LAB — BLOOD GAS, ARTERIAL
ALLENS TEST (PASS/FAIL): POSITIVE — AB
Acid-Base Excess: 6.8 mmol/L — ABNORMAL HIGH (ref 0.0–3.0)
Acid-Base Excess: 6.3 mmol/L — ABNORMAL HIGH (ref 0.0–3.0)
BICARBONATE: 40 meq/L — AB (ref 21.0–28.0)
Bicarbonate: 37.2 mEq/L — ABNORMAL HIGH (ref 21.0–28.0)
Delivery systems: POSITIVE
EXPIRATORY PAP: 5
FIO2: 30
FIO2: 0.45
INSPIRATORY PAP: 18
LHR: 20 {breaths}/min
MECHVT: 600 mL
Mechanical Rate: 12
O2 SAT: 85.2 %
O2 Saturation: 95.2 %
PATIENT TEMPERATURE: 37
PCO2 ART: 100 mmHg — AB (ref 32.0–48.0)
PEEP/CPAP: 5 cmH2O
PH ART: 7.21 — AB (ref 7.350–7.450)
PO2 ART: 61 mmHg — AB (ref 83.0–108.0)
PO2 ART: 87 mmHg (ref 83.0–108.0)
Patient temperature: 37
pCO2 arterial: 81 mmHg (ref 32.0–48.0)
pH, Arterial: 7.27 — ABNORMAL LOW (ref 7.350–7.450)

## 2015-02-11 LAB — BASIC METABOLIC PANEL
ANION GAP: 1 — AB (ref 5–15)
BUN: 16 mg/dL (ref 6–20)
CALCIUM: 8.8 mg/dL — AB (ref 8.9–10.3)
CO2: 41 mmol/L — AB (ref 22–32)
CREATININE: 1.29 mg/dL — AB (ref 0.61–1.24)
Chloride: 101 mmol/L (ref 101–111)
GFR calc non Af Amer: >60 mL/min (ref >60)
Glucose, Bld: 137 mg/dL — ABNORMAL HIGH (ref 65–99)
Potassium: 4.4 mmol/L (ref 3.5–5.1)
SODIUM: 143 mmol/L (ref 135–145)

## 2015-02-11 LAB — CBC
HCT: 54.5 % — ABNORMAL HIGH (ref 40.0–52.0)
HEMOGLOBIN: 17.5 g/dL (ref 13.0–18.0)
MCH: 27.5 pg (ref 26.0–34.0)
MCHC: 32.1 g/dL (ref 32.0–36.0)
MCV: 85.9 fL (ref 80.0–100.0)
PLATELETS: 172 10*3/uL (ref 150–440)
RBC: 6.34 MIL/uL — AB (ref 4.40–5.90)
RDW: 15.1 % — ABNORMAL HIGH (ref 11.5–14.5)
WBC: 5.9 10*3/uL (ref 3.8–10.6)

## 2015-02-11 LAB — GLUCOSE, CAPILLARY
GLUCOSE-CAPILLARY: 161 mg/dL — AB (ref 65–99)
GLUCOSE-CAPILLARY: 187 mg/dL — AB (ref 65–99)
GLUCOSE-CAPILLARY: 193 mg/dL — AB (ref 65–99)

## 2015-02-11 LAB — BRAIN NATRIURETIC PEPTIDE: B NATRIURETIC PEPTIDE 5: 55 pg/mL (ref 0.0–100.0)

## 2015-02-11 LAB — TROPONIN I

## 2015-02-11 LAB — MRSA PCR SCREENING: MRSA BY PCR: NEGATIVE

## 2015-02-11 LAB — TRIGLYCERIDES: Triglycerides: 83 mg/dL (ref <150)

## 2015-02-11 MED ORDER — PROPOFOL 1000 MG/100ML IV EMUL
INTRAVENOUS | Status: AC
  Administered 2015-02-11: 17:00:00
  Filled 2015-02-11: qty 100

## 2015-02-11 MED ORDER — ONDANSETRON HCL 4 MG PO TABS: 4.0000 mg | ORAL_TABLET | Freq: Four times a day (QID) | ORAL | Status: DC | PRN

## 2015-02-11 MED ORDER — INSULIN DETEMIR 100 UNIT/ML ~~LOC~~ SOLN
40.0000 [IU] | Freq: Every day | SUBCUTANEOUS | Status: DC
  Filled 2015-02-11: qty 0.4

## 2015-02-11 MED ORDER — ATORVASTATIN CALCIUM 20 MG PO TABS
40.0000 mg | ORAL_TABLET | Freq: Every day | ORAL | Status: DC
  Administered 2015-02-11 – 2015-02-20 (×10): 40 mg via ORAL
  Filled 2015-02-11 (×11): qty 2

## 2015-02-11 MED ORDER — CHLORHEXIDINE GLUCONATE 0.12% ORAL RINSE (MEDLINE KIT)
15.0000 mL | Freq: Two times a day (BID) | OROMUCOSAL | Status: DC
  Administered 2015-02-11 – 2015-02-18 (×13): 15 mL via OROMUCOSAL
  Filled 2015-02-11 (×16): qty 15

## 2015-02-11 MED ORDER — ETOMIDATE 2 MG/ML IV SOLN
21.0000 mg | Freq: Once | INTRAVENOUS | Status: AC
  Administered 2015-02-11: 21 mg via INTRAVENOUS

## 2015-02-11 MED ORDER — ENOXAPARIN SODIUM 40 MG/0.4ML ~~LOC~~ SOLN
40.0000 mg | SUBCUTANEOUS | Status: DC
  Administered 2015-02-11 – 2015-02-12 (×2): 40 mg via SUBCUTANEOUS
  Filled 2015-02-11 (×2): qty 0.4

## 2015-02-11 MED ORDER — FENTANYL CITRATE (PF) 100 MCG/2ML IJ SOLN
50.0000 ug | Freq: Once | INTRAMUSCULAR | Status: AC
  Administered 2015-02-11: 50 ug via INTRAVENOUS

## 2015-02-11 MED ORDER — ALBUTEROL SULFATE (2.5 MG/3ML) 0.083% IN NEBU
2.5000 mg | INHALATION_SOLUTION | Freq: Once | RESPIRATORY_TRACT | Status: AC
  Administered 2015-02-11: 2.5 mg via RESPIRATORY_TRACT

## 2015-02-11 MED ORDER — IPRATROPIUM-ALBUTEROL 0.5-2.5 (3) MG/3ML IN SOLN
3.0000 mL | RESPIRATORY_TRACT | Status: DC
  Administered 2015-02-11 – 2015-02-20 (×55): 3 mL via RESPIRATORY_TRACT
  Filled 2015-02-11 (×56): qty 3

## 2015-02-11 MED ORDER — PROPOFOL 1000 MG/100ML IV EMUL
5.0000 ug/kg/min | INTRAVENOUS | Status: DC
  Administered 2015-02-11 (×3): 60 ug/kg/min via INTRAVENOUS
  Administered 2015-02-11: 30 ug/kg/min via INTRAVENOUS
  Administered 2015-02-12: 40 ug/kg/min via INTRAVENOUS
  Administered 2015-02-12: 50 ug/kg/min via INTRAVENOUS
  Administered 2015-02-12 (×2): 40 ug/kg/min via INTRAVENOUS
  Administered 2015-02-12 (×2): 50 ug/kg/min via INTRAVENOUS
  Administered 2015-02-12: 40 ug/kg/min via INTRAVENOUS
  Administered 2015-02-13: 50 ug/kg/min via INTRAVENOUS
  Administered 2015-02-13: 60 ug/kg/min via INTRAVENOUS
  Administered 2015-02-13: 50 ug/kg/min via INTRAVENOUS
  Administered 2015-02-13: 65 ug/kg/min via INTRAVENOUS
  Administered 2015-02-13 (×3): 60 ug/kg/min via INTRAVENOUS
  Administered 2015-02-13: 50 ug/kg/min via INTRAVENOUS
  Administered 2015-02-13: 65 ug/kg/min via INTRAVENOUS
  Administered 2015-02-14: 60.04 ug/kg/min via INTRAVENOUS
  Administered 2015-02-14 (×3): 60 ug/kg/min via INTRAVENOUS
  Filled 2015-02-11 (×19): qty 100
  Filled 2015-02-11: qty 200
  Filled 2015-02-11 (×2): qty 100
  Filled 2015-02-11: qty 200
  Filled 2015-02-11 (×3): qty 100

## 2015-02-11 MED ORDER — ACETAMINOPHEN 325 MG PO TABS
650.0000 mg | ORAL_TABLET | Freq: Four times a day (QID) | ORAL | Status: DC | PRN
  Administered 2015-02-13 – 2015-02-18 (×2): 650 mg via ORAL
  Filled 2015-02-11 (×2): qty 2

## 2015-02-11 MED ORDER — ALBUTEROL SULFATE (2.5 MG/3ML) 0.083% IN NEBU
INHALATION_SOLUTION | RESPIRATORY_TRACT | Status: AC
  Administered 2015-02-11: 2.5 mg via RESPIRATORY_TRACT
  Filled 2015-02-11: qty 3

## 2015-02-11 MED ORDER — METOPROLOL TARTRATE 100 MG PO TABS
100.0000 mg | ORAL_TABLET | Freq: Two times a day (BID) | ORAL | Status: DC
  Filled 2015-02-11: qty 1

## 2015-02-11 MED ORDER — FUROSEMIDE 40 MG PO TABS: 40.0000 mg | ORAL_TABLET | Freq: Two times a day (BID) | ORAL | Status: DC

## 2015-02-11 MED ORDER — INSULIN ASPART 100 UNIT/ML ~~LOC~~ SOLN: 8.0000 [IU] | Freq: Three times a day (TID) | SUBCUTANEOUS | Status: DC

## 2015-02-11 MED ORDER — ONDANSETRON HCL 4 MG/2ML IJ SOLN: 4.0000 mg | Freq: Four times a day (QID) | INTRAMUSCULAR | Status: DC | PRN

## 2015-02-11 MED ORDER — INSULIN ASPART 100 UNIT/ML ~~LOC~~ SOLN
0.0000 [IU] | SUBCUTANEOUS | Status: DC
  Administered 2015-02-11 – 2015-02-13 (×11): 3 [IU] via SUBCUTANEOUS
  Administered 2015-02-13: 5 [IU] via SUBCUTANEOUS
  Administered 2015-02-13: 3 [IU] via SUBCUTANEOUS
  Administered 2015-02-14: 5 [IU] via SUBCUTANEOUS
  Administered 2015-02-14: 2 [IU] via SUBCUTANEOUS
  Administered 2015-02-14 (×4): 3 [IU] via SUBCUTANEOUS
  Administered 2015-02-15 (×2): 2 [IU] via SUBCUTANEOUS
  Administered 2015-02-15: 3 [IU] via SUBCUTANEOUS
  Administered 2015-02-15: 8 [IU] via SUBCUTANEOUS
  Administered 2015-02-15: 3 [IU] via SUBCUTANEOUS
  Administered 2015-02-15 – 2015-02-16 (×3): 5 [IU] via SUBCUTANEOUS
  Administered 2015-02-16: 3 [IU] via SUBCUTANEOUS
  Administered 2015-02-16: 8 [IU] via SUBCUTANEOUS
  Administered 2015-02-16 – 2015-02-17 (×3): 3 [IU] via SUBCUTANEOUS
  Administered 2015-02-17 – 2015-02-18 (×2): 2 [IU] via SUBCUTANEOUS
  Filled 2015-02-11 (×3): qty 3
  Filled 2015-02-11: qty 2
  Filled 2015-02-11 (×2): qty 3
  Filled 2015-02-11: qty 5
  Filled 2015-02-11 (×2): qty 2
  Filled 2015-02-11: qty 3
  Filled 2015-02-11: qty 5
  Filled 2015-02-11 (×2): qty 3
  Filled 2015-02-11: qty 5
  Filled 2015-02-11: qty 3
  Filled 2015-02-11: qty 5
  Filled 2015-02-11 (×2): qty 3
  Filled 2015-02-11: qty 8
  Filled 2015-02-11 (×4): qty 3
  Filled 2015-02-11: qty 8
  Filled 2015-02-11 (×2): qty 3
  Filled 2015-02-11: qty 2
  Filled 2015-02-11: qty 3
  Filled 2015-02-11: qty 5
  Filled 2015-02-11: qty 2
  Filled 2015-02-11 (×4): qty 3

## 2015-02-11 MED ORDER — ASPIRIN 81 MG PO CHEW
81.0000 mg | CHEWABLE_TABLET | Freq: Every day | ORAL | Status: DC
Start: 2015-02-11 — End: 2015-02-21
  Administered 2015-02-12 – 2015-02-21 (×10): 81 mg via ORAL
  Filled 2015-02-11 (×11): qty 1

## 2015-02-11 MED ORDER — MIDAZOLAM HCL 2 MG/2ML IJ SOLN
4.0000 mg | Freq: Once | INTRAMUSCULAR | Status: AC
Start: 2015-02-11 — End: 2015-02-11

## 2015-02-11 MED ORDER — FENTANYL CITRATE (PF) 100 MCG/2ML IJ SOLN
INTRAMUSCULAR | Status: AC
  Filled 2015-02-11: qty 2

## 2015-02-11 MED ORDER — ISOSORBIDE DINITRATE 10 MG PO TABS
10.0000 mg | ORAL_TABLET | Freq: Three times a day (TID) | ORAL | Status: DC
  Administered 2015-02-11 – 2015-02-15 (×11): 10 mg via ORAL
  Filled 2015-02-11 (×12): qty 1

## 2015-02-11 MED ORDER — METHYLPREDNISOLONE SODIUM SUCC 125 MG IJ SOLR
80.0000 mg | Freq: Four times a day (QID) | INTRAMUSCULAR | Status: DC
  Administered 2015-02-11 – 2015-02-14 (×10): 80 mg via INTRAVENOUS
  Filled 2015-02-11 (×10): qty 2

## 2015-02-11 MED ORDER — IPRATROPIUM-ALBUTEROL 0.5-2.5 (3) MG/3ML IN SOLN
3.0000 mL | Freq: Once | RESPIRATORY_TRACT | Status: AC
  Administered 2015-02-11: 3 mL via RESPIRATORY_TRACT
  Filled 2015-02-11: qty 3

## 2015-02-11 MED ORDER — LEVOFLOXACIN IN D5W 750 MG/150ML IV SOLN
750.0000 mg | Freq: Once | INTRAVENOUS | Status: AC
  Administered 2015-02-11: 750 mg via INTRAVENOUS
  Filled 2015-02-11: qty 150

## 2015-02-11 MED ORDER — METHYLPREDNISOLONE SODIUM SUCC 125 MG IJ SOLR
125.0000 mg | Freq: Once | INTRAMUSCULAR | Status: AC
  Administered 2015-02-11: 125 mg via INTRAVENOUS
  Filled 2015-02-11: qty 2

## 2015-02-11 MED ORDER — ACETAMINOPHEN 650 MG RE SUPP: 650.0000 mg | Freq: Four times a day (QID) | RECTAL | Status: DC | PRN

## 2015-02-11 MED ORDER — MIDAZOLAM HCL 2 MG/2ML IJ SOLN
INTRAMUSCULAR | Status: AC
  Administered 2015-02-11: 4 mg
  Filled 2015-02-11: qty 6

## 2015-02-11 MED ORDER — BUDESONIDE 0.25 MG/2ML IN SUSP
0.2500 mg | Freq: Two times a day (BID) | RESPIRATORY_TRACT | Status: DC
  Administered 2015-02-11 – 2015-02-14 (×6): 0.25 mg via RESPIRATORY_TRACT
  Filled 2015-02-11 (×7): qty 2

## 2015-02-11 MED ORDER — CLONIDINE HCL 0.2 MG/24HR TD PTWK
0.2000 mg | MEDICATED_PATCH | TRANSDERMAL | Status: DC
  Filled 2015-02-11: qty 1

## 2015-02-11 MED ORDER — LEVOFLOXACIN IN D5W 500 MG/100ML IV SOLN
500.0000 mg | INTRAVENOUS | Status: DC
  Administered 2015-02-12 – 2015-02-15 (×4): 500 mg via INTRAVENOUS
  Filled 2015-02-11 (×4): qty 100

## 2015-02-11 MED ORDER — LISINOPRIL 10 MG PO TABS
10.0000 mg | ORAL_TABLET | Freq: Every day | ORAL | Status: DC
  Administered 2015-02-12 – 2015-02-15 (×4): 10 mg via ORAL
  Filled 2015-02-11 (×5): qty 1

## 2015-02-11 MED ORDER — IPRATROPIUM-ALBUTEROL 0.5-2.5 (3) MG/3ML IN SOLN
9.0000 mL | Freq: Once | RESPIRATORY_TRACT | Status: AC
  Administered 2015-02-11: 9 mL via RESPIRATORY_TRACT
  Filled 2015-02-11: qty 9

## 2015-02-11 MED ORDER — METHYLPREDNISOLONE SODIUM SUCC 40 MG IJ SOLR
40.0000 mg | Freq: Four times a day (QID) | INTRAMUSCULAR | Status: DC
  Administered 2015-02-11: 40 mg via INTRAVENOUS
  Filled 2015-02-11: qty 1

## 2015-02-11 MED ORDER — ANTISEPTIC ORAL RINSE SOLUTION (CORINZ)
7.0000 mL | Freq: Four times a day (QID) | OROMUCOSAL | Status: DC
  Administered 2015-02-11 – 2015-02-16 (×18): 7 mL via OROMUCOSAL
  Filled 2015-02-11 (×22): qty 7

## 2015-02-11 NOTE — ED Notes (Signed)
Pt arrives to ED via [facility] c/o SOB X 2 days. Seen in ER X 2 days ago and prescribed a "zpack". Pt has been taking zpack as prescribed. SOB with exertion per EMS. Attempt for IV by EMS unsuccessful. Pt given 1 duoneb en route. Pt 85% on RA, arrives on 2L [place] with EMS, saturation 93% which is baseline per patient. Pt wears 1.5-2L [place] of oxygen at home continuously. Pt denies CP. Productive cough, thick and white sputum. Pt alert and oriented X4, active, cooperative, pt in NAD. RR even and unlabored, color WNL.

## 2015-02-11 NOTE — ED Provider Notes (Signed)
-----------------------------------------   8:37 AM on 02/11/2015 -----------------------------------------   Accept signout from Dr. Dineen Kid at 720. Patient with hypoxia. Likely admission indicated.  At this time, the patient reports he still feels a little short of breath. He has noisy breathing with wheezes bilaterally. He is satting 91% with a nasal cannula, but on room air he quickly drops to 86%. With his ongoing hypoxia, we will continue neb treatments and seek admission the hospital for him. He has been taking azithromycin at home. He is allergic to penicillin. We will give him Levaquin.  I discussed case with the hospitalist for admission.     Chest x-ray: FINDINGS: Heart size is normal. Mediastinal shadows are normal. Linear pulmonary scarring in the right hilar infrahilar region remains the same. Chronic volume loss elsewhere in both lower lobes appears the same. No evidence of new infiltrate, collapse or effusion. Bony structures are unremarkable.  IMPRESSION: Chronic scarring/ volume loss bilaterally without radiographic change.  Corey Prima, MD 02/11/15 [phone]

## 2015-02-11 NOTE — ED Notes (Signed)
Will change adm orders to tele, bipap applied per RT

## 2015-02-11 NOTE — ED Notes (Signed)
MD at bedside. 

## 2015-02-11 NOTE — ED Notes (Signed)
o2 increased to 3 l/m oxi increased to 90

## 2015-02-11 NOTE — ED Provider Notes (Signed)
[facility] Emergency Department Provider Note  ____________________________________________  Time seen: Approximately 6:40 AM  I have reviewed the triage vital signs and the nursing notes.   HISTORY  Chief Complaint Shortness of Breath    HPI [name] is a 45 y.o. male with a history of COPD on home oxygen and sarcoidosis who is presenting today with increased shortness of breath over the past 2 days. He denies any pain. He says he has been taking his Z-Pak as prescribed without any improved symptoms. He is also on a steroid taper at home. He has a wet sounding cough but without any sputum production. He denies any fevers or known sick contacts. He says that he has also noticed swelling to the bilateral lower extremities over the past 2 days.   Past Medical History  Diagnosis Date  . Hypertension   . Diabetes mellitus without complication ([place])   . COPD (chronic obstructive pulmonary disease) ([place])   . Hypercholesteremia unk  . Coronary artery disease   . Stroke ([place])   . Sleep apnea     does not use CPAP regularly.  . Sarcoidosis [facility])     Patient Active Problem List   Diagnosis Date Noted  . Acute respiratory failure with hypoxia and hypercarbia ([place]) 12/28/2014  . Diabetes ([place]) 10/19/2014  . Tobacco use 10/19/2014  . Chronic diastolic heart failure ([place]) 10/18/2014  . Chronic obstructive pulmonary disease ([place]) 10/18/2014  . Sleep apnea 10/18/2014  . Acute on chronic respiratory failure with hypoxia ([place]) 08/24/2014  . Acute and chronic respiratory failure 04/22/2013  . Sarcoidosis ([place]) 04/22/2013  . Other and unspecified hyperlipidemia 04/22/2013    Past Surgical History  Procedure Laterality Date  . Appendectomy      Current Outpatient Rx  Name  Route  Sig  Dispense  Refill  . albuterol (PROVENTIL HFA;VENTOLIN HFA) 108 (90 Base) MCG/ACT inhaler   Inhalation   Inhale 2 puffs into the lungs every 6 (six) hours as needed for  wheezing or shortness of breath.   1 Inhaler   2   . aspirin 81 MG chewable tablet   Oral   Chew 81 mg by mouth daily.         Marland Kitchen atorvastatin (LIPITOR) 40 MG tablet   Oral   Take 40 mg by mouth at bedtime.          Marland Kitchen azithromycin (ZITHROMAX Z-PAK) 250 MG tablet      Take 2 tablets (500 mg) on  Day 1,  followed by 1 tablet (250 mg) once daily on Days 2 through 5.   6 each   0   . cloNIDine (CATAPRES - DOSED IN MG/24 HR) 0.2 mg/24hr patch   Transdermal   Place 0.2 mg onto the skin once a week.         . Fluticasone-Salmeterol (ADVAIR) 250-50 MCG/DOSE AEPB   Inhalation   Inhale 1 puff into the lungs 2 (two) times daily.         . furosemide (LASIX) 40 MG tablet   Oral   Take 1 tablet (40 mg total) by mouth 2 (two) times daily.   60 tablet   0   . insulin detemir (LEVEMIR) 100 UNIT/ML injection   Subcutaneous   Inject 40 Units into the skin at bedtime.          . insulin lispro (HUMALOG) 100 UNIT/ML injection   Subcutaneous   Inject 8 Units into the skin 3 (three) times daily  with meals. Sliding scale as needed         . isosorbide dinitrate (ISORDIL) 10 MG tablet   Oral   Take 10 mg by mouth 3 (three) times daily.         Marland Kitchen lisinopril (PRINIVIL,ZESTRIL) 10 MG tablet   Oral   Take 10 mg by mouth daily.         . metFORMIN (GLUCOPHAGE-XR) 500 MG 24 hr tablet   Oral   Take 1,000 mg by mouth 2 (two) times daily.         . metoprolol (LOPRESSOR) 100 MG tablet   Oral   Take 100 mg by mouth 2 (two) times daily.         . predniSONE (DELTASONE) 20 MG tablet   Oral   Take 2 tablets (40 mg total) by mouth daily.   10 tablet   0   . tiotropium (SPIRIVA) 18 MCG inhalation capsule   Inhalation   Place 18 mcg into inhaler and inhale daily.           Allergies Penicillins  Family History  Problem Relation Age of Onset  . Hypertension Mother   . Heart disease Mother   . Diabetes Mother   . Cancer Father     Social History Social  History  Substance Use Topics  . Smoking status: Current Every Day Smoker -- 0.33 packs/day    Types: Cigarettes  . Smokeless tobacco: Never Used  . Alcohol Use: 0.0 oz/week    0 Standard drinks or equivalent per week     Comment: occassional    Review of Systems Constitutional: No fever/chills Eyes: No visual changes. ENT: No sore throat. Cardiovascular: Denies chest pain. Respiratory: As above  Gastrointestinal: No abdominal pain.  No nausea, no vomiting.  No diarrhea.  No constipation. Genitourinary: Negative for dysuria. Musculoskeletal: Negative for back pain. Skin: Negative for rash. Neurological: Negative for headaches, focal weakness or numbness.  10-point ROS otherwise negative.  ____________________________________________   PHYSICAL EXAM:  VITAL SIGNS: ED Triage Vitals  Enc Vitals Group     BP 02/11/15 0636 152/95 mmHg     Pulse Rate 02/11/15 0636 90     Resp 02/11/15 0634 22     Temp --      Temp Source 02/11/15 0634 Oral     SpO2 02/11/15 0634 67 %     Weight 02/11/15 0634 275 lb (124.739 kg)     Height 02/11/15 0634 6' (1.829 m)     Head Cir --      Peak Flow --      Pain Score --      Pain Loc --      Pain Edu? --      Excl. in [place]? --     Constitutional: Alert and oriented. Well appearing and in no acute distress. Eyes: Conjunctivae are normal. PERRL. EOMI. Head: Atraumatic. Nose: No congestion/rhinnorhea. Mouth/Throat: Mucous membranes are moist.  Oropharynx non-erythematous. Neck: No stridor.   Cardiovascular: Normal rate, regular rhythm. Grossly normal heart sounds.  Good peripheral circulation. Respiratory: Mildly tachypneic with decreased breath sounds throughout with prolonged extra phase and coarse rhonchi with wheezing throughout. No retractions. Speaks in full sentences. Gastrointestinal: Soft and nontender. No distention. No abdominal bruits. No CVA tenderness. Musculoskeletal: Bilateral lower extremities with moderate edema to the  feet and calves..  No joint effusions. Neurologic:  Normal speech and language. No gross focal neurologic deficits are appreciated. No gait instability. Skin:  Skin is  warm, dry and intact. No rash noted. Psychiatric: Mood and affect are normal. Speech and behavior are normal.  ____________________________________________   LABS (all labs ordered are listed, but only abnormal results are displayed)  Labs Reviewed  BASIC METABOLIC PANEL  CBC  BRAIN NATRIURETIC PEPTIDE  TROPONIN I   ____________________________________________  EKG  ED ECG REPORT I, Debora Stockdale,  Youlanda Roys, the attending physician, personally viewed and interpreted this ECG.   Date: 02/11/2015  EKG Time: 642  Rate: 88  Rhythm: normal sinus rhythm  Axis: Normal axis  Intervals:right bundle branch block and left anterior fascicular block  ST&T Change: No ST segment elevation or depression. No abnormal T-wave inversion.  No significant change from 02/09/2015.  ____________________________________________  RADIOLOGY  Pending ____________________________________________   PROCEDURES   ____________________________________________   INITIAL IMPRESSION / ASSESSMENT AND PLAN / ED COURSE  Pertinent labs & imaging results that were available during my care of the patient were reviewed by me and considered in my medical decision making (see chart for details).  ----------------------------------------- 7:12 AM on 02/11/2015 -----------------------------------------  Patient satting in the upper 90s on the nebulizer. Pending chest x-ray at this time as well as lab work. Signed out to Dr. Thomasene Lot. Pending chest x-ray at this time but likely will need treatment with antibiotics for hospital-acquired pneumonia.  However, cannot rule out pulmonary edema as well. ____________________________________________   FINAL CLINICAL IMPRESSION(S) / ED DIAGNOSES  COPD exacerbation. Hypoxia. Shortness of  breath.    Orbie Pyo, MD 02/11/15 [phone]

## 2015-02-11 NOTE — ED Notes (Signed)
Unsuccessful IV attempt x 1. Primary RN notified.

## 2015-02-11 NOTE — Progress Notes (Signed)
[facility] Progress Note Patient Name: [name] DOB: 05-15-1970 MRN: [id number]   Date of Service  02/11/2015  HPI/Events of Note  Responding to note from 2h agao that diprivan not controlling sedation well and bp trending down  cmaera exam and d/w RN at bedside - current MAP 85, sbp 108  And patient well sedated and synch with vent  eICU Interventions  Continue current Rx     Intervention Category Intermediate Interventions: Other:  Ernestine Rohman 02/11/2015, 7:14 PM

## 2015-02-11 NOTE — ED Notes (Addendum)
Pt placed on 2L [place] 88% at this time. Baseline 93% per patient and EMS

## 2015-02-11 NOTE — Consult Note (Signed)
[facility] Consultation      Assessment and Plan:  Acute exacerbation of COPD with acute hypercapnic respiratory failure. -Patient is currently being treated with BiPAP, initial blood gas showed PCO2 125. We'll repeat blood gas this ensure that the patient is improving. Continue steroids  Sarcoidosis. -Appears to be stable at this time. We'll continue the patient on steroids.    Date: 02/11/2015  MRN# [id number] [name] 19-Sep-1970  Referring Physician:   DESTAN [name] is a 45 y.o. old male seen in consultation for chief complaint of:    Chief Complaint  Patient presents with  . Shortness of Breath   Patient is currentlyon a full facial BiPAP. Therefore, he cannot provide a history or review of systems, this was obtained from the chart and from staff. Per the history from the hospitalist as below:  HPI:  [name] is a 45 y.o. male with a known history of hypertension, type 2 diabetes without consultation, COPD with ongoing tobacco abuse, hyperlipidemia, history of previous CVA, sleep apnea, sarcoidosis who presented to the hospital due to shortness of breath progressively getting worse over the past week. Patient says he presented to the emergency room a few days back and was started on a prednisone taper and a Z-Pak but his symptoms have not improved. Over the past day or so he has had significant dyspnea even at rest and worse with exertion. He admits to a cough which is unproductive. He denies any fevers, chills, nausea, vomiting. He admits to some mild chest pain only when he coughs. He denies any recent sick contacts. He presented to the emergency room as his symptoms are not improving and he was noted to be in COPD exacerbation and hospitalist services were contacted further treatment and evaluation.  I reviewed the most recent chest x-ray images both CT scan and chest x-ray as well as reports the show severe emphysema, air trapping, and scarring  consistent with sarcoidosis.  PMHX:   Past Medical History  Diagnosis Date  . Hypertension   . Diabetes mellitus without complication ([place])   . COPD (chronic obstructive pulmonary disease) ([place])   . Hypercholesteremia unk  . Coronary artery disease   . Stroke ([place])   . Sleep apnea     does not use CPAP regularly.  . Sarcoidosis [facility])    Surgical Hx:  Past Surgical History  Procedure Laterality Date  . Appendectomy     Family Hx:  Family History  Problem Relation Age of Onset  . Hypertension Mother   . Heart disease Mother   . Diabetes Mother   . Cancer Father    Social Hx:   Social History  Substance Use Topics  . Smoking status: Current Every Day Smoker -- 0.33 packs/day    Types: Cigarettes  . Smokeless tobacco: Never Used  . Alcohol Use: 0.0 oz/week    0 Standard drinks or equivalent per week     Comment: occassional   Medication:   Current Outpatient Rx  Name  Route  Sig  Dispense  Refill  . aspirin 81 MG chewable tablet   Oral   Chew 81 mg by mouth daily.         Marland Kitchen atorvastatin (LIPITOR) 40 MG tablet   Oral   Take 40 mg by mouth at bedtime.          . cloNIDine (CATAPRES - DOSED IN MG/24 HR) 0.2 mg/24hr patch   Transdermal   Place 0.2 mg onto the  skin once a week.         . Fluticasone-Salmeterol (ADVAIR) 250-50 MCG/DOSE AEPB   Inhalation   Inhale 1 puff into the lungs 2 (two) times daily.         . furosemide (LASIX) 40 MG tablet   Oral   Take 1 tablet (40 mg total) by mouth 2 (two) times daily.   60 tablet   0   . insulin detemir (LEVEMIR) 100 UNIT/ML injection   Subcutaneous   Inject 40 Units into the skin at bedtime.          . insulin lispro (HUMALOG) 100 UNIT/ML injection   Subcutaneous   Inject 8 Units into the skin 3 (three) times daily with meals. Sliding scale as needed         . isosorbide dinitrate (ISORDIL) 10 MG tablet   Oral   Take 10 mg by mouth 3 (three) times daily.         Marland Kitchen lisinopril (PRINIVIL,ZESTRIL)  10 MG tablet   Oral   Take 10 mg by mouth daily.         . metFORMIN (GLUCOPHAGE-XR) 500 MG 24 hr tablet   Oral   Take 1,000 mg by mouth 2 (two) times daily.         . metoprolol (LOPRESSOR) 100 MG tablet   Oral   Take 100 mg by mouth 2 (two) times daily.         Marland Kitchen tiotropium (SPIRIVA) 18 MCG inhalation capsule   Inhalation   Place 18 mcg into inhaler and inhale daily.             Allergies:  Penicillins  Review of Systems: Could not be obtained as the patient is currently on BiPAP.  Physical Examination:   VS: BP 167/114 mmHg  Pulse 95  Resp 20  Ht 6' (1.829 m)  Wt 275 lb (124.739 kg)  BMI 37.29 kg/m2  SpO2 89%  General Appearance: No distress  Neuro:without focal findings,  speech normal,  HEENT: PERRLA, EOM intact.   Pulmonary: normal breath sounds, No wheezing.  CardiovascularNormal S1,S2.  No m/r/g.   Abdomen: Benign, Soft, non-tender. Renal:  No costovertebral tenderness  GU:  No performed at this time. Endoc: No evident thyromegaly, no signs of acromegaly. Skin:   warm, no rashes, no ecchymosis  Extremities: normal, no cyanosis, clubbing.  Other findings:    LABORATORY PANEL:   CBC  Recent Labs Lab 02/11/15 0638  WBC 5.9  HGB 17.5  HCT 54.5*  PLT 172   ------------------------------------------------------------------------------------------------------------------  Chemistries   Recent Labs Lab 02/11/15 0638  NA 143  K 4.4  CL 101  CO2 41*  GLUCOSE 137*  BUN 16  CREATININE 1.29*  CALCIUM 8.8*   ------------------------------------------------------------------------------------------------------------------  Cardiac Enzymes  Recent Labs Lab 02/11/15 0638  TROPONINI <0.03   ------------------------------------------------------------  RADIOLOGY:  Dg Chest 1 View  02/11/2015  CLINICAL DATA:  Shortness of breath, 2 days duration. Re-evaluation after treatment 2 days ago. EXAM: CHEST 1 VIEW COMPARISON:  02/08/2014  and previous FINDINGS: Heart size is normal. Mediastinal shadows are normal. Linear pulmonary scarring in the right hilar infrahilar region remains the same. Chronic volume loss elsewhere in both lower lobes appears the same. No evidence of new infiltrate, collapse or effusion. Bony structures are unremarkable. IMPRESSION: Chronic scarring/ volume loss bilaterally without radiographic change. Electronically Signed   By: Nelson Chimes M.D.   On: 02/11/2015 07:04       Thank  you  for the consultation and for allowing [facility] to assist in the care of your patient. Our recommendations are noted above.  Please contact us if we can be of further service.   Marda Stalker, MD.  Board Certified in Internal Medicine, Pulmonary Medicine, [place], and Sleep Medicine.  [facility] Office Number: [phone]  Patricia Pesa, M.D.  Vilinda Boehringer, M.D.  Merton Border, M.D  [place].  I have personally obtained a history, examined the patient, evaluated laboratory and imaging results, formulated the assessment and plan and placed orders. The Patient requires high complexity decision making for assessment and support, frequent evaluation and titration of therapies, application of advanced monitoring technologies and extensive interpretation of multiple databases. The patient has critical illness that could lead imminently to failure of 1 or more organ systems and requires the highest level of physician preparedness to intervene.  Critical Care Time devoted to patient care services described in this note is 35 minutes and is exclusive of time spent in procedures.

## 2015-02-11 NOTE — ED Notes (Signed)
Adm md with pt 

## 2015-02-11 NOTE — Consult Note (Signed)
ANTIBIOTIC CONSULT NOTE - INITIAL  Pharmacy Consult for Levaquin Indication: pneumonia  Allergies  Allergen Reactions  . Penicillins Anaphylaxis and Other (See Comments)    Unable to obtain enough information to answer additional questions about this medication.      Patient Measurements: Height: 6' (182.9 cm) Weight: 294 lb 15.6 oz (133.8 kg) IBW/kg (Calculated) : 77.6 Adjusted Body Weight: 100 kg  Vital Signs: Temp: 97.8 F (36.6 C) (01/06 1420) Temp Source: Axillary (01/06 1420) BP: 130/87 mmHg (01/06 1420) Pulse Rate: 94 (01/06 1420) Intake/Output from previous day:   Intake/Output from this shift:    Labs:  Recent Labs  02/09/15 0734 02/11/15 0638  WBC 4.9 5.9  HGB 16.5 17.5  PLT 171 172  CREATININE 1.35* 1.29*   Estimated Creatinine Clearance: 103.5 mL/min (by C-G formula based on Cr of 1.29). No results for input(s): VANCOTROUGH, VANCOPEAK, VANCORANDOM, GENTTROUGH, GENTPEAK, GENTRANDOM, TOBRATROUGH, TOBRAPEAK, TOBRARND, AMIKACINPEAK, AMIKACINTROU, AMIKACIN in the last 72 hours.   Microbiology: No results found for this or any previous visit (from the past 720 hour(s)).  Medical History: Past Medical History  Diagnosis Date  . Hypertension   . Diabetes mellitus without complication ([place])   . COPD (chronic obstructive pulmonary disease) ([place])   . Hypercholesteremia unk  . Coronary artery disease   . Stroke ([place])   . Sleep apnea     does not use CPAP regularly.  . Sarcoidosis ([place])     Medications:  Scheduled:  . aspirin  81 mg Oral Daily  . atorvastatin  40 mg Oral QHS  . budesonide (PULMICORT) nebulizer solution  0.25 mg Nebulization BID  . cloNIDine  0.2 mg Transdermal Weekly  . enoxaparin (LOVENOX) injection  40 mg Subcutaneous Q24H  . furosemide  40 mg Oral BID  . insulin aspart  8 Units Subcutaneous TID WC  . insulin detemir  40 Units Subcutaneous QHS  . ipratropium-albuterol  3 mL Nebulization Q4H  . isosorbide dinitrate  10 mg Oral TID   . [START ON 02/12/2015] levofloxacin (LEVAQUIN) IV  500 mg Intravenous Q24H  . lisinopril  10 mg Oral Daily  . methylPREDNISolone (SOLU-MEDROL) injection  40 mg Intravenous Q6H  . metoprolol  100 mg Oral BID   Infusions:   Assessment: Pharmacy consulted to dose Levaquin in a 45 yo male admitted for acute respiratory failure.    SCr: 1.29, est CrCl~103 mL/min  BCx: pending x 2  Goal of Therapy:  Resolution of infection  Plan:  Patient received Levaquin 750 mg IV once today.  Will then start patient on Levaquin 500 mg IV q24h for treatment of CAP and based on renal function.   Follow up culture results.   Ulysses Alper G 02/11/2015,2:36 PM

## 2015-02-11 NOTE — Procedures (Signed)
Endotracheal Intubation: Patient required placement of an artificial airway secondary to resp failure.   Consent: Emergent.   Hand washing performed prior to starting the procedure.   Medications administered for sedation prior to procedure: Midazolam 4 mg IV,  Etomidate 20 mg,  Fentanyl 50 mcg IV.   Procedure: A time out procedure was called and correct patient, name, & ID confirmed. Needed supplies and equipment were assembled and checked to include ETT, 10 ml syringe, Glidescope, Mac and Miller blades, suction, oxygen and bag mask valve, end tidal CO2 monitor. Patient was positioned to align the mouth and pharynx to facilitate visualization of the glottis.  Heart rate, SpO2 and blood pressure was continuously monitored during the procedure. Pre-oxygenation was conducted prior to intubation and endotracheal tube was placed through the vocal cords into the trachea.  During intubation an assistant applied gentle pressure to the cricoid cartilage.   The artificial airway was placed under direct visualization via glidescope route using a 8 ETT on the first attempt.    ETT was secured at 23 cm mark.    Placement was confirmed by auscuitation of lungs with good breath sounds bilaterally and no stomach sounds.  Condensation was noted on endotracheal tube.  Pulse ox %.  CO2 detector in place with appropriate color change.   Complications: None .    Chest radiograph ordered and pending.     Marda Stalker, M.D.

## 2015-02-11 NOTE — H&P (Addendum)
[place] [facility] [place] NAME: [name]    MR#:  [id number]  DATE OF BIRTH:  01/17/1971  DATE OF ADMISSION:  02/11/2015  PRIMARY CARE PHYSICIAN: [facility]   REQUESTING/REFERRING PHYSICIAN: Dr. Francene Castle  CHIEF COMPLAINT:   Chief Complaint  Patient presents with  . Shortness of Breath    HISTORY OF PRESENT ILLNESS:  [name]  is a 45 y.o. male with a known history of hypertension, type 2 diabetes without consultation, COPD with ongoing tobacco abuse, hyperlipidemia, history of previous CVA, sleep apnea, sarcoidosis who presented to the hospital due to shortness of breath progressively getting worse over the past week. Patient says he presented to the emergency room a few days back and was started on a prednisone taper and a Z-Pak but his symptoms have not improved. Over the past day or so he has had significant dyspnea even at rest and worse with exertion. He admits to a cough which is unproductive. He denies any fevers, chills, nausea, vomiting. He admits to some mild chest pain only when he coughs. He denies any recent sick contacts. He presented to the emergency room as his symptoms are not improving and he was noted to be in COPD exacerbation and hospitalist services were contacted further treatment and evaluation.  PAST MEDICAL HISTORY:   Past Medical History  Diagnosis Date  . Hypertension   . Diabetes mellitus without complication ([place])   . COPD (chronic obstructive pulmonary disease) ([place])   . Hypercholesteremia unk  . Coronary artery disease   . Stroke ([place])   . Sleep apnea     does not use CPAP regularly.  . Sarcoidosis ([place])     PAST SURGICAL HISTORY:   Past Surgical History  Procedure Laterality Date  . Appendectomy      SOCIAL HISTORY:   Social History  Substance Use Topics  . Smoking status: Current Every Day Smoker -- 0.33 packs/day    Types: Cigarettes  . Smokeless  tobacco: Never Used  . Alcohol Use: 0.0 oz/week    0 Standard drinks or equivalent per week     Comment: occassional    FAMILY HISTORY:   Family History  Problem Relation Age of Onset  . Hypertension Mother   . Heart disease Mother   . Diabetes Mother   . Cancer Father     DRUG ALLERGIES:   Allergies  Allergen Reactions  . Penicillins Anaphylaxis and Other (See Comments)    Unable to obtain enough information to answer additional questions about this medication.      REVIEW OF SYSTEMS:   Review of Systems  Constitutional: Negative for fever and weight loss.  HENT: Negative for congestion, nosebleeds and tinnitus.   Eyes: Negative for blurred vision, double vision and redness.  Respiratory: Positive for cough, shortness of breath and wheezing. Negative for hemoptysis.   Cardiovascular: Negative for chest pain, orthopnea, leg swelling and PND.  Gastrointestinal: Negative for nausea, vomiting, abdominal pain, diarrhea and melena.  Genitourinary: Negative for dysuria, urgency and hematuria.  Musculoskeletal: Negative for joint pain and falls.  Neurological: Negative for dizziness, tingling, sensory change, focal weakness, seizures, weakness and headaches.  Endo/Heme/Allergies: Negative for polydipsia. Does not bruise/bleed easily.  Psychiatric/Behavioral: Negative for depression and memory loss. The patient is not nervous/anxious.     MEDICATIONS AT HOME:   Prior to Admission medications   Medication Sig Start Date End Date Taking? Authorizing Provider  aspirin 81 MG chewable  tablet Chew 81 mg by mouth daily.   Yes Historical Provider, MD  atorvastatin (LIPITOR) 40 MG tablet Take 40 mg by mouth at bedtime.    Yes Historical Provider, MD  cloNIDine (CATAPRES - DOSED IN MG/24 HR) 0.2 mg/24hr patch Place 0.2 mg onto the skin once a week.   Yes Historical Provider, MD  Fluticasone-Salmeterol (ADVAIR) 250-50 MCG/DOSE AEPB Inhale 1 puff into the lungs 2 (two) times daily.   Yes  Historical Provider, MD  furosemide (LASIX) 40 MG tablet Take 1 tablet (40 mg total) by mouth 2 (two) times daily. 08/26/14  Yes Dustin Flock, MD  insulin detemir (LEVEMIR) 100 UNIT/ML injection Inject 40 Units into the skin at bedtime.    Yes Historical Provider, MD  insulin lispro (HUMALOG) 100 UNIT/ML injection Inject 8 Units into the skin 3 (three) times daily with meals. Sliding scale as needed   Yes Historical Provider, MD  isosorbide dinitrate (ISORDIL) 10 MG tablet Take 10 mg by mouth 3 (three) times daily.   Yes Historical Provider, MD  lisinopril (PRINIVIL,ZESTRIL) 10 MG tablet Take 10 mg by mouth daily.   Yes Historical Provider, MD  metFORMIN (GLUCOPHAGE-XR) 500 MG 24 hr tablet Take 1,000 mg by mouth 2 (two) times daily.   Yes Historical Provider, MD  metoprolol (LOPRESSOR) 100 MG tablet Take 100 mg by mouth 2 (two) times daily.   Yes Historical Provider, MD  tiotropium (SPIRIVA) 18 MCG inhalation capsule Place 18 mcg into inhaler and inhale daily.   Yes Historical Provider, MD      VITAL SIGNS:  Blood pressure 151/95, pulse 96, resp. rate 32, height 6' (1.829 m), weight 124.739 kg (275 lb), SpO2 89 %.  PHYSICAL EXAMINATION:  Physical Exam  GENERAL:  45 y.o.-year-old obese patient lying in the bed lethargic and in mild respiratory distress.  EYES: Pupils equal, round, reactive to light and accommodation. No scleral icterus. Extraocular muscles intact.  HEENT: Head atraumatic, normocephalic. Oropharynx and nasopharynx clear. No oropharyngeal erythema, moist oral mucosa  NECK:  Supple, no jugular venous distention. No thyroid enlargement, no tenderness.  LUNGS: Prolonged inspiratory and expiratory phase. Diffuse wheezing, rhonchi bilaterally. No use of accessory muscles of respiration.  CARDIOVASCULAR: S1, S2 RRR. No murmurs, rubs, gallops, clicks.  ABDOMEN: Soft, nontender, nondistended. Bowel sounds present. No organomegaly or mass.  EXTREMITIES: +1-2 pitting edema bilaterally,  No cyanosis, + clubbing. + 2 pedal & radial pulses b/l.   NEUROLOGIC: Cranial nerves II through XII are intact. No focal Motor or sensory deficits appreciated b/l.  Globally weak and encephalopathic PSYCHIATRIC: The patient is alert and oriented x 3. Good affect.  SKIN: No obvious rash, lesion, or ulcer.   LABORATORY PANEL:   CBC  Recent Labs Lab 02/11/15 0638  WBC 5.9  HGB 17.5  HCT 54.5*  PLT 172   ------------------------------------------------------------------------------------------------------------------  Chemistries   Recent Labs Lab 02/11/15 0638  NA 143  K 4.4  CL 101  CO2 41*  GLUCOSE 137*  BUN 16  CREATININE 1.29*  CALCIUM 8.8*   ------------------------------------------------------------------------------------------------------------------  Cardiac Enzymes  Recent Labs Lab 02/11/15 0638  TROPONINI <0.03   ------------------------------------------------------------------------------------------------------------------  RADIOLOGY:  Dg Chest 1 View  02/11/2015  CLINICAL DATA:  Shortness of breath, 2 days duration. Re-evaluation after treatment 2 days ago. EXAM: CHEST 1 VIEW COMPARISON:  02/08/2014 and previous FINDINGS: Heart size is normal. Mediastinal shadows are normal. Linear pulmonary scarring in the right hilar infrahilar region remains the same. Chronic volume loss elsewhere in both lower lobes appears  the same. No evidence of new infiltrate, collapse or effusion. Bony structures are unremarkable. IMPRESSION: Chronic scarring/ volume loss bilaterally without radiographic change. Electronically Signed   By: Nelson Chimes M.D.   On: 02/11/2015 07:04     IMPRESSION AND PLAN:   45 year old male with past history of hypertension, diabetes type 2 without consultation, COPD with ongoing tobacco abuse, history of previous CVA, obstructive sleep apnea, who presented to the hospital due to shortness of breath and noted to be in COPD exacerbation.  #1  COPD exacerbation-this is the cause of patient's shortness of breath wheezing. -I will start the patient on IV steroids, around-the-clock DuoNeb's, Pulmicort nebs. -Patient's chest x-ray does not show any evidence of acute pneumonia but I will empirically start him on Levaquin. -He has a history of CO2 retention therefore I'll get a ABG.  #2 diabetes type 2 without, patient-continue Levemir, NovoLog with meals. I will add some sliding scale insulin.  #3 hypertension-continue clonidine patch, Isordil, lisinopril.  #4 hyperlipidemia-continue atorvastatin.    All the records are reviewed and case discussed with ED provider. Management plans discussed with the patient, family and they are in agreement.  CODE STATUS: Full   TOTAL Critical Care TIME TAKING CARE OF THIS PATIENT: 50 minutes.    Henreitta Leber M.D on 02/11/2015 at 9:28 AM  Between 7am to 6pm - Pager - [phone]  After 6pm go to [email or web address] - password [email or web address] [place] [facility]  Office  [phone]  CC: Primary care physician; [facility]

## 2015-02-11 NOTE — Progress Notes (Signed)
ANTIBIOTIC CONSULT NOTE - INITIAL  Pharmacy Consult for Levaquin Indication: rule out pneumonia  Allergies  Allergen Reactions  . Penicillins Anaphylaxis and Other (See Comments)    Unable to obtain enough information to answer additional questions about this medication.      Patient Measurements: Height: 6' (182.9 cm) Weight: 294 lb 15.6 oz (133.8 kg) IBW/kg (Calculated) : 77.6  Vital Signs: Temp: 97.8 F (36.6 C) (01/06 1420) Temp Source: Axillary (01/06 1420) BP: 130/87 mmHg (01/06 1420) Pulse Rate: 94 (01/06 1420) Intake/Output from previous day:   Intake/Output from this shift:    Labs:  Recent Labs  02/09/15 0734 02/11/15 0638  WBC 4.9 5.9  HGB 16.5 17.5  PLT 171 172  CREATININE 1.35* 1.29*   Estimated Creatinine Clearance: 103.5 mL/min (by C-G formula based on Cr of 1.29). No results for input(s): VANCOTROUGH, VANCOPEAK, VANCORANDOM, GENTTROUGH, GENTPEAK, GENTRANDOM, TOBRATROUGH, TOBRAPEAK, TOBRARND, AMIKACINPEAK, AMIKACINTROU, AMIKACIN in the last 72 hours.   Microbiology: No results found for this or any previous visit (from the past 720 hour(s)).  Medical History: Past Medical History  Diagnosis Date  . Hypertension   . Diabetes mellitus without complication ([place])   . COPD (chronic obstructive pulmonary disease) ([place])   . Hypercholesteremia unk  . Coronary artery disease   . Stroke ([place])   . Sleep apnea     does not use CPAP regularly.  . Sarcoidosis ([place])     Medications:  Scheduled:  . aspirin  81 mg Oral Daily  . atorvastatin  40 mg Oral QHS  . budesonide (PULMICORT) nebulizer solution  0.25 mg Nebulization BID  . cloNIDine  0.2 mg Transdermal Weekly  . enoxaparin (LOVENOX) injection  40 mg Subcutaneous Q24H  . furosemide  40 mg Oral BID  . insulin aspart  8 Units Subcutaneous TID WC  . insulin detemir  40 Units Subcutaneous QHS  . ipratropium-albuterol  3 mL Nebulization Q4H  . isosorbide dinitrate  10 mg Oral TID  . [START ON  02/12/2015] levofloxacin (LEVAQUIN) IV  500 mg Intravenous Q24H  . lisinopril  10 mg Oral Daily  . methylPREDNISolone (SOLU-MEDROL) injection  40 mg Intravenous Q6H  . metoprolol  100 mg Oral BID   Infusions:    Assessment: 45 y/o M admitted with COPD exacerbation, r/o PNA.  Plan:  Levaquin 750 mg iv once given in ED. Will continue with Levaquin 500 mg iv q 24 hours from tomorrow.  Will continue to follow renal function and culture results.   Ulice Dash D 02/11/2015,2:42 PM

## 2015-02-12 DIAGNOSIS — R0600 Dyspnea, unspecified: Secondary | ICD-10-CM

## 2015-02-12 LAB — BASIC METABOLIC PANEL
Anion gap: 11 (ref 5–15)
BUN: 22 mg/dL — ABNORMAL HIGH (ref 6–20)
CALCIUM: 8.7 mg/dL — AB (ref 8.9–10.3)
CO2: 30 mmol/L (ref 22–32)
CREATININE: 1.64 mg/dL — AB (ref 0.61–1.24)
Chloride: 100 mmol/L — ABNORMAL LOW (ref 101–111)
GFR calc non Af Amer: 49 mL/min — ABNORMAL LOW (ref >60)
GFR, EST AFRICAN AMERICAN: 57 mL/min — AB (ref >60)
Glucose, Bld: 162 mg/dL — ABNORMAL HIGH (ref 65–99)
Potassium: 3.8 mmol/L (ref 3.5–5.1)
SODIUM: 141 mmol/L (ref 135–145)

## 2015-02-12 LAB — CBC
HCT: 50 % (ref 40.0–52.0)
Hemoglobin: 15.9 g/dL (ref 13.0–18.0)
MCH: 27 pg (ref 26.0–34.0)
MCHC: 31.8 g/dL — ABNORMAL LOW (ref 32.0–36.0)
MCV: 85 fL (ref 80.0–100.0)
PLATELETS: 169 10*3/uL (ref 150–440)
RBC: 5.88 MIL/uL (ref 4.40–5.90)
RDW: 14.8 % — ABNORMAL HIGH (ref 11.5–14.5)
WBC: 7.8 10*3/uL (ref 3.8–10.6)

## 2015-02-12 LAB — EXPECTORATED SPUTUM ASSESSMENT W REFEX TO RESP CULTURE

## 2015-02-12 LAB — GLUCOSE, CAPILLARY
GLUCOSE-CAPILLARY: 163 mg/dL — AB (ref 65–99)
GLUCOSE-CAPILLARY: 160 mg/dL — AB (ref 65–99)
GLUCOSE-CAPILLARY: 153 mg/dL — AB (ref 65–99)
Glucose-Capillary: 156 mg/dL — ABNORMAL HIGH (ref 65–99)
Glucose-Capillary: 171 mg/dL — ABNORMAL HIGH (ref 65–99)
Glucose-Capillary: 166 mg/dL — ABNORMAL HIGH (ref 65–99)

## 2015-02-12 LAB — EXPECTORATED SPUTUM ASSESSMENT W GRAM STAIN, RFLX TO RESP C

## 2015-02-12 MED ORDER — AZITHROMYCIN 200 MG/5ML PO SUSR
500.0000 mg | Freq: Every day | ORAL | Status: DC
  Administered 2015-02-13: 500 mg via ORAL
  Filled 2015-02-12: qty 12.5

## 2015-02-12 MED ORDER — FAMOTIDINE 40 MG/5ML PO SUSR
20.0000 mg | Freq: Every day | ORAL | Status: DC
  Administered 2015-02-12 – 2015-02-14 (×3): 20 mg
  Filled 2015-02-12 (×3): qty 2.5

## 2015-02-12 MED ORDER — METOPROLOL TARTRATE 25 MG PO TABS
25.0000 mg | ORAL_TABLET | Freq: Four times a day (QID) | ORAL | Status: DC
  Administered 2015-02-12 – 2015-02-15 (×12): 25 mg via ORAL
  Filled 2015-02-12 (×12): qty 1

## 2015-02-12 MED ORDER — FREE WATER
100.0000 mL | Freq: Three times a day (TID) | Status: DC
  Administered 2015-02-12 – 2015-02-16 (×14): 100 mL

## 2015-02-12 MED ORDER — CLONIDINE HCL 0.1 MG/24HR TD PTWK: 0.1000 mg | MEDICATED_PATCH | TRANSDERMAL | Status: DC

## 2015-02-12 MED ORDER — VITAL HIGH PROTEIN PO LIQD
1000.0000 mL | ORAL | Status: DC
  Administered 2015-02-12 – 2015-02-13 (×3)
  Administered 2015-02-13: 1000 mL
  Administered 2015-02-13 – 2015-02-14 (×3)
  Administered 2015-02-14: 1000 mL
  Administered 2015-02-14 (×6)
  Administered 2015-02-14 – 2015-02-16 (×6): 1000 mL

## 2015-02-12 MED ORDER — PRO-STAT SUGAR FREE PO LIQD
30.0000 mL | Freq: Every day | ORAL | Status: DC
  Administered 2015-02-12 – 2015-02-16 (×5): 30 mL

## 2015-02-12 MED ORDER — SODIUM CHLORIDE 0.9 % IV SOLN
INTRAVENOUS | Status: DC
  Administered 2015-02-12 – 2015-02-14 (×4): via INTRAVENOUS

## 2015-02-12 NOTE — Progress Notes (Signed)
[place] at [place] NAME: [name]    MR#:  [id number]  DATE OF BIRTH:  November 17, 1970  SUBJECTIVE:  CHIEF COMPLAINT:   Chief Complaint  Patient presents with  . Shortness of Breath   patient is 45 year old African-American male with history of hypertension, COPD, diabetes, coronary artery disease, hyperlipidemia who presented to the hospital with complaints of cough, shortness of breath worsening over a period of time and not improving with antibiotic therapy. Patient is admitted to the hospital and eventually intubated due to worsening shortness of breath. He remains on mechanical ventilation. Review of system is unavailable.   Review of Systems  Unable to perform ROS: intubated    VITAL SIGNS: Blood pressure 123/79, pulse 88, temperature 97.8 F (36.6 C), temperature source Oral, resp. rate 20, height 6' (1.829 m), weight 133.8 kg (294 lb 15.6 oz), SpO2 98 %.  PHYSICAL EXAMINATION:   GENERAL:  45 y.o.-year-old patient lying in the bed with no acute distress. On mechanical ventilation EYES: Pupils equal, round, reactive to light and accommodation. No scleral icterus. Extraocular muscles intact.  HEENT: Head atraumatic, normocephalic. Oropharynx and nasopharynx clear.  NECK:  Supple, no jugular venous distention. No thyroid enlargement, no tenderness.  LUNGS: Diminished breath sounds bilaterally, bilateral scattered wheezing, rhonchi , no crepitations. No use of accessory muscles of respiration.  CARDIOVASCULAR: S1, S2 normal. No murmurs, rubs, or gallops.  ABDOMEN: Soft, nontender, nondistended. Bowel sounds present. No organomegaly or mass.  EXTREMITIES: No pedal edema, cyanosis, or clubbing.  NEUROLOGIC: Cranial nerves II through XII are intact. Muscle strength 5/5 in all extremities. Sensation intact. Gait not checked.  PSYCHIATRIC: The patient is sedated  SKIN: No obvious rash, lesion, or ulcer.   ORDERS/RESULTS REVIEWED:    CBC  Recent Labs Lab 02/09/15 0734 02/11/15 0638 02/12/15 0458  WBC 4.9 5.9 7.8  HGB 16.5 17.5 15.9  HCT 52.1* 54.5* 50.0  PLT 171 172 169  MCV 83.9 85.9 85.0  MCH 26.6 27.5 27.0  MCHC 31.7* 32.1 31.8*  RDW 14.9* 15.1* 14.8*   ------------------------------------------------------------------------------------------------------------------  Chemistries   Recent Labs Lab 02/09/15 0734 02/11/15 0638 02/12/15 0458  NA 142 143 141  K 4.2 4.4 3.8  CL 104 101 100*  CO2 36* 41* 30  GLUCOSE 118* 137* 162*  BUN 16 16 22*  CREATININE 1.35* 1.29* 1.64*  CALCIUM 8.4* 8.8* 8.7*   ------------------------------------------------------------------------------------------------------------------ estimated creatinine clearance is 81.4 mL/min (by C-G formula based on Cr of 1.64). ------------------------------------------------------------------------------------------------------------------ No results for input(s): TSH, T4TOTAL, T3FREE, THYROIDAB in the last 72 hours.  Invalid input(s): FREET3  Cardiac Enzymes  Recent Labs Lab 02/09/15 0734 02/11/15 0638  TROPONINI <0.03 <0.03   ------------------------------------------------------------------------------------------------------------------ Invalid input(s): POCBNP ---------------------------------------------------------------------------------------------------------------  RADIOLOGY: Dg Chest 1 View  02/11/2015  CLINICAL DATA:  Shortness of breath, 2 days duration. Re-evaluation after treatment 2 days ago. EXAM: CHEST 1 VIEW COMPARISON:  02/08/2014 and previous FINDINGS: Heart size is normal. Mediastinal shadows are normal. Linear pulmonary scarring in the right hilar infrahilar region remains the same. Chronic volume loss elsewhere in both lower lobes appears the same. No evidence of new infiltrate, collapse or effusion. Bony structures are unremarkable. IMPRESSION: Chronic scarring/ volume loss bilaterally without  radiographic change. Electronically Signed   By: Nelson Chimes M.D.   On: 02/11/2015 07:04   Dg Abd 1 View  02/11/2015  CLINICAL DATA:  Nasogastric tube placement. EXAM: ABDOMEN - 1 VIEW COMPARISON:  None. FINDINGS: Nasogastric tube is adequately positioned  within the stomach, tip likely in the mid stomach body region. Lung bases are grossly clear. Visualized bowel gas pattern is nonobstructive. IMPRESSION: Nasogastric tube appears adequately positioned in the stomach. Electronically Signed   By: Franki Cabot M.D.   On: 02/11/2015 17:51   Dg Chest Port 1 View  02/11/2015  CLINICAL DATA:  Intubation EXAM: PORTABLE CHEST 1 VIEW COMPARISON:  Chest x-ray from earlier same day and chest x-ray dated 02/09/2015. FINDINGS: Endotracheal tube is well positioned with tip approximately 4 cm above the level of the carina. Nasogastric tube passes below the diaphragm. Heart size is upper normal. Overall cardiomediastinal silhouette is stable in size and configuration. Chronic scarring is again noted at the right lung base. No new lung findings. No evidence of pneumonia. No pneumothorax. Osseous structures about the chest are unremarkable. IMPRESSION: 1. Endotracheal tube well positioned with tip approximately 4 cm above the level of the carina. 2. Nasogastric tube in the stomach. 3. No acute-appearing pulmonary abnormality. Electronically Signed   By: Franki Cabot M.D.   On: 02/11/2015 17:54    EKG:  Orders placed or performed during the hospital encounter of 02/11/15  . ED EKG  . ED EKG  . EKG 12-Lead  . EKG 12-Lead    ASSESSMENT AND PLAN:  Active Problems:   Acute respiratory failure with hypoxia and hypercarbia (HCC)   COPD exacerbation (HCC)  #1. Acute on chronic respiratory failure with hypoxia and hypercapnia, status post intubation and mechanical ventilation, continue patient on duo nebs, Pulmicort, levofloxacin as well as Zithromax, steroid injections, following clinically #2. Acute bronchitis,  continue antibiotic therapy. Get sputum cultures if possible, MRSA screen by PCR was negative #3. COPD exacerbation, as above. Continue therapy. Follow clinically #4. Diabetes mellitus type 2. Continue patient on Levemir and NovoLog, sliding scale insulin. Blood glucose levels are ranging between 160s to 200s #5 renal insufficiency, follow patient's creatinine closely, discontinue Lasix, no IV fluids due to peripheral edema   Management plans discussed with the patient, family and they are in agreement.   DRUG ALLERGIES:  Allergies  Allergen Reactions  . Penicillins Anaphylaxis and Other (See Comments)    Unable to obtain enough information to answer additional questions about this medication.      CODE STATUS:     Code Status Orders        Start     Ordered   02/11/15 1417  Full code   Continuous     02/11/15 1417      TOTAL CRITICAL CARE TIME TAKING CARE OF THIS PATIENT: 40 minutes.    Theodoro Grist M.D on 02/12/2015 at 2:08 PM  Between 7am to 6pm - Pager - [phone]  After 6pm go to [email or web address] - password [email or web address] [place] [facility]  Office  [phone]  CC: Primary care physician; [facility]

## 2015-02-12 NOTE — Progress Notes (Signed)
Et tube noted to be at 27 cm at the lip. BBS very diminished on the left. Pulled et tube back to 25 cm. BBS diminished with better aeration noted on the left. Patient tolerated well. Patient stable. Vitals normal. Will continue to monitor

## 2015-02-12 NOTE — Progress Notes (Signed)
Initial Nutrition Assessment    INTERVENTION:   Coordination of Care: if unable to extubate within 24 hours, recommend initiation of tube feeding   NUTRITION DIAGNOSIS:   Inadequate oral intake related to acute illness as evidenced by NPO status.  GOAL:   Provide needs based on ASPEN/SCCM guidelines122-  MONITOR:    (Energy Intake, Anthropometrics, Digestive System, Electrolyte/Renal Profile, Glucose Profile)  REASON FOR ASSESSMENT:   Ventilator    ASSESSMENT:    Pt admitted with acute respiratory failure due to COPD exacerbation requiring intubation  Past Medical History  Diagnosis Date  . Hypertension   . Diabetes mellitus without complication ([place])   . COPD (chronic obstructive pulmonary disease) ([place])   . Hypercholesteremia unk  . Coronary artery disease   . Stroke ([place])   . Sleep apnea     does not use CPAP regularly.  . Sarcoidosis ([place])     Diet Order:  Diet NPO time specified  Electrolyte and Renal Profile:  Recent Labs Lab 02/09/15 0734 02/11/15 0638 02/12/15 0458  BUN 16 16 22*  CREATININE 1.35* 1.29* 1.64*  NA 142 143 141  K 4.2 4.4 3.8   Glucose Profile:   Recent Labs  02/11/15 2330 02/12/15 0331 02/12/15 0759  GLUCAP 161* 156* 163*   Meds: lasix, ss novolog, solumedrol, diprivan  Height:   Ht Readings from Last 1 Encounters:  02/11/15 6' (1.829 m)    Weight:   Wt Readings from Last 1 Encounters:  02/11/15 294 lb 15.6 oz (133.8 kg)    Filed Weights   02/11/15 0634 02/11/15 1420  Weight: 275 lb (124.739 kg) 294 lb 15.6 oz (133.8 kg)   Wt Readings from Last 10 Encounters:  02/11/15 294 lb 15.6 oz (133.8 kg)  02/09/15 275 lb (124.739 kg)  12/28/14 287 lb (130.182 kg)  11/17/14 290 lb (131.543 kg)  10/18/14 291 lb (131.997 kg)  08/25/14 291 lb 14.4 oz (132.405 kg)  08/14/14 280 lb (127.007 kg)  08/11/14 280 lb (127.007 kg)    BMI:  Body mass index is 40 kg/(m^2).  Estimated Nutritional Needs:   Kcal:  [id number]  (11-14 kcals/kg) using wt of 133.8  Protein:  122-162 g (1.5-2.0 g/kg) using IBW 81 kg  Fluid:  2025-2430 mL (25-30 ml/kg)   HIGH Care Level  Kerman Passey MS, RD, LDN [phone] Pager  [phone] Weekend/On-Call Pager

## 2015-02-12 NOTE — Progress Notes (Addendum)
[facility] Progess Note    ASSESSMENT/PLAN    Assessment and Plan:  Acute exacerbation of COPD with acute hypercapnic respiratory failure. -Patient is currently being treated with BiPAP, initial blood gas showed PCO2 125. We'll repeat blood gas this ensure that the patient is improving. Continue steroids  Vent dependent respiratory failure, setting reviewed, will continue current settings. Not candidate for extubation today.   Check sputum culture.   Sarcoidosis. -Appears to be stable at this time. We'll continue the patient on steroids.   Intubated 02/11/15    MICRO DATA:   ANTIMICROBIALS:    ---------------------------------------   ----------------------------------------   Name: [name] MRN: [id number] DOB: Nov 15, 1970    ADMISSION DATE:  02/11/2015     SUBJECTIVE:   Pt currently on the ventilator, can not provide history or review of systems.   Review of Systems:  Constitutional: Feels well. Cardiovascular: No chest pain.  Pulmonary: Denies dyspnea.   The remainder of systems were reviewed and were found to be negative other than what is documented in the HPI.    VITAL SIGNS: Temp:  [97.8 F (36.6 C)-99.4 F (37.4 C)] 97.8 F (36.6 C) (01/07 1100) Pulse Rate:  [87-99] 88 (01/07 1200) Resp:  [14-33] 20 (01/07 1200) BP: (86-142)/(55-90) 123/79 mmHg (01/07 1200) SpO2:  [92 %-100 %] 98 % (01/07 1200) FiO2 (%):  [32 %-45 %] 45 % (01/07 1200) Weight:  [294 lb 15.6 oz (133.8 kg)] 294 lb 15.6 oz (133.8 kg) (01/06 1420) HEMODYNAMICS:   VENTILATOR SETTINGS: Vent Mode:  [-] PRVC FiO2 (%):  [32 %-45 %] 45 % Set Rate:  [20 bmp] 20 bmp Vt Set:  [600 mL] 600 mL PEEP:  [5 cmH20] 5 cmH20 Plateau Pressure:  [19 cmH20-20 cmH20] 19 cmH20 INTAKE / OUTPUT:  Intake/Output Summary (Last 24 hours) at 02/12/15 1405 Last data filed at 02/12/15 1100  Gross per 24 hour  Intake 799.52 ml  Output    900 ml  Net -100.48 ml     PHYSICAL EXAMINATION: Physical Examination:   VS: BP 123/79 mmHg  Pulse 88  Temp(Src) 97.8 F (36.6 C) (Oral)  Resp 20  Ht 6' (1.829 m)  Wt 294 lb 15.6 oz (133.8 kg)  BMI 40.00 kg/m2  SpO2 98%  General Appearance: No distress  Neuro:without focal findings,  HEENT: PERRLA, EOM intact. Pulmonary: normal breath sounds   CardiovascularNormal S1,S2.  No m/r/g.   Abdomen: Benign, Soft, non-tender. Renal:  No costovertebral tenderness  GU:  Not performed at this time. Endocrine: No evident thyromegaly. Skin:   warm, no rashes, no ecchymosis  Extremities: normal, no cyanosis, clubbing.   LABS:   LABORATORY PANEL:   CBC  Recent Labs Lab 02/12/15 0458  WBC 7.8  HGB 15.9  HCT 50.0  PLT 169    Chemistries   Recent Labs Lab 02/12/15 0458  NA 141  K 3.8  CL 100*  CO2 30  GLUCOSE 162*  BUN 22*  CREATININE 1.64*  CALCIUM 8.7*     Recent Labs Lab 02/11/15 1728 02/11/15 2143 02/11/15 2330 02/12/15 0331 02/12/15 0759 02/12/15 1157  GLUCAP 187* 193* 161* 156* 163* 160*    Recent Labs Lab 02/11/15 0948 02/11/15 1539 02/11/15 1705  PHART 7.14* 7.21* 7.27*  PCO2ART 124* 100* 81*  PO2ART PENDING 61* 87   No results for input(s): AST, ALT, ALKPHOS, BILITOT, ALBUMIN in the last 168 hours.  Cardiac Enzymes  Recent Labs Lab 02/11/15 0638  TROPONINI <0.03  RADIOLOGY:  Dg Chest 1 View  02/11/2015  CLINICAL DATA:  Shortness of breath, 2 days duration. Re-evaluation after treatment 2 days ago. EXAM: CHEST 1 VIEW COMPARISON:  02/08/2014 and previous FINDINGS: Heart size is normal. Mediastinal shadows are normal. Linear pulmonary scarring in the right hilar infrahilar region remains the same. Chronic volume loss elsewhere in both lower lobes appears the same. No evidence of new infiltrate, collapse or effusion. Bony structures are unremarkable. IMPRESSION: Chronic scarring/ volume loss bilaterally without radiographic change. Electronically Signed   By:  Nelson Chimes M.D.   On: 02/11/2015 07:04   Dg Abd 1 View  02/11/2015  CLINICAL DATA:  Nasogastric tube placement. EXAM: ABDOMEN - 1 VIEW COMPARISON:  None. FINDINGS: Nasogastric tube is adequately positioned within the stomach, tip likely in the mid stomach body region. Lung bases are grossly clear. Visualized bowel gas pattern is nonobstructive. IMPRESSION: Nasogastric tube appears adequately positioned in the stomach. Electronically Signed   By: Franki Cabot M.D.   On: 02/11/2015 17:51   Dg Chest Port 1 View  02/11/2015  CLINICAL DATA:  Intubation EXAM: PORTABLE CHEST 1 VIEW COMPARISON:  Chest x-ray from earlier same day and chest x-ray dated 02/09/2015. FINDINGS: Endotracheal tube is well positioned with tip approximately 4 cm above the level of the carina. Nasogastric tube passes below the diaphragm. Heart size is upper normal. Overall cardiomediastinal silhouette is stable in size and configuration. Chronic scarring is again noted at the right lung base. No new lung findings. No evidence of pneumonia. No pneumothorax. Osseous structures about the chest are unremarkable. IMPRESSION: 1. Endotracheal tube well positioned with tip approximately 4 cm above the level of the carina. 2. Nasogastric tube in the stomach. 3. No acute-appearing pulmonary abnormality. Electronically Signed   By: Franki Cabot M.D.   On: 02/11/2015 17:54       --Marda Stalker, MD.  Pager [phone] [facility]   Patricia Pesa, M.D.  Vilinda Boehringer, M.D.  Merton Border, M.D

## 2015-02-12 NOTE — Progress Notes (Signed)
Brief Nutrition Follow-up:  Dietitian Consult received for initiation of TF.Initial Assessment entered this AM. Recommend starting Vital High Protein at rate of 20 ml/hr with goal of 60 ml/hr with addition of Prostat daily providing 1540 kcals, 142 g of protein and 1210 mL of free water. Adult tube feeding protocol ordered. Continue to assess  Kerman Passey MS, [place], LDN [phone] Pager  [phone] Weekend/On-Call Pager

## 2015-02-12 NOTE — Progress Notes (Signed)
ANTIBIOTIC CONSULT NOTE - FOLLOW UP   Pharmacy Consult for Levaquin Indication: rule out pneumonia  Allergies  Allergen Reactions  . Penicillins Anaphylaxis and Other (See Comments)    Unable to obtain enough information to answer additional questions about this medication.      Patient Measurements: Height: 6' (182.9 cm) Weight: 294 lb 15.6 oz (133.8 kg) IBW/kg (Calculated) : 77.6  Vital Signs: Temp: 98.8 F (37.1 C) (01/07 0718) Temp Source: Oral (01/07 0718) BP: 123/79 mmHg (01/07 1100) Pulse Rate: 87 (01/07 1100) Intake/Output from previous day: 01/06 0701 - 01/07 0700 In: 584 [I.V.:584] Out: 625 [Urine:625] Intake/Output from this shift: Total I/O In: 215.6 [I.V.:115.6; IV Piggyback:100] Out: 125 [Urine:125]  Labs:  Recent Labs  02/11/15 0638 02/12/15 0458  WBC 5.9 7.8  HGB 17.5 15.9  PLT 172 169  CREATININE 1.29* 1.64*   Estimated Creatinine Clearance: 81.4 mL/min (by C-G formula based on Cr of 1.64). No results for input(s): VANCOTROUGH, VANCOPEAK, VANCORANDOM, GENTTROUGH, GENTPEAK, GENTRANDOM, TOBRATROUGH, TOBRAPEAK, TOBRARND, AMIKACINPEAK, AMIKACINTROU, AMIKACIN in the last 72 hours.   Microbiology: Recent Results (from the past 720 hour(s))  Culture, blood (routine x 2)     Status: None (Preliminary result)   Collection Time: 02/11/15  6:38 AM  Result Value Ref Range Status   Specimen Description BLOOD RIGHT HAND  Final   Special Requests BOTTLES DRAWN AEROBIC AND ANAEROBIC  1CC  Final   Culture NO GROWTH < 24 HOURS  Final   Report Status PENDING  Incomplete  Culture, blood (routine x 2)     Status: None (Preliminary result)   Collection Time: 02/11/15  8:53 AM  Result Value Ref Range Status   Specimen Description BLOOD LEFT HAND  Final   Special Requests BOTTLES DRAWN AEROBIC AND ANAEROBIC  2CC  Final   Culture NO GROWTH < 24 HOURS  Final   Report Status PENDING  Incomplete  MRSA PCR Screening     Status: None   Collection Time: 02/11/15  2:00  PM  Result Value Ref Range Status   MRSA by PCR NEGATIVE NEGATIVE Final    Comment:        The GeneXpert MRSA Assay (FDA approved for NASAL specimens only), is one component of a comprehensive MRSA colonization surveillance program. It is not intended to diagnose MRSA infection nor to guide or monitor treatment for MRSA infections.     Medical History: Past Medical History  Diagnosis Date  . Hypertension   . Diabetes mellitus without complication ([place])   . COPD (chronic obstructive pulmonary disease) ([place])   . Hypercholesteremia unk  . Coronary artery disease   . Stroke ([place])   . Sleep apnea     does not use CPAP regularly.  . Sarcoidosis ([place])     Medications:  Scheduled:  . antiseptic oral rinse  7 mL Mouth Rinse QID  . aspirin  81 mg Oral Daily  . atorvastatin  40 mg Oral QHS  . budesonide (PULMICORT) nebulizer solution  0.25 mg Nebulization BID  . chlorhexidine gluconate  15 mL Mouth Rinse BID  . cloNIDine  0.2 mg Transdermal Weekly  . enoxaparin (LOVENOX) injection  40 mg Subcutaneous Q24H  . furosemide  40 mg Oral BID  . insulin aspart  0-15 Units Subcutaneous 6 times per day  . ipratropium-albuterol  3 mL Nebulization Q4H  . isosorbide dinitrate  10 mg Oral TID  . levofloxacin (LEVAQUIN) IV  500 mg Intravenous Q24H  . lisinopril  10 mg Oral Daily  .  methylPREDNISolone (SOLU-MEDROL) injection  80 mg Intravenous Q6H  . metoprolol  100 mg Oral BID   Infusions:  . propofol (DIPRIVAN) infusion 40 mcg/kg/min (02/12/15 [id number])    Assessment: 45 y/o M admitted with COPD exacerbation, r/o PNA.  Plan:  Continue Levofloxacin 500 mg IV q24 hours.  Will continue to follow renal function and culture results.   Angelino Rumery D 02/12/2015,11:31 AM

## 2015-02-12 NOTE — Plan of Care (Signed)
Problem: Phase I Progression Outcomes Goal: VTE prophylaxis Outcome: Completed/Met Date Met:  02/12/15 Lovenox, asa Goal: GIProphysixis Outcome: Completed/Met Date Met:  02/12/15 Protonix. Goal: Code status addressed with pt/family Outcome: Completed/Met Date Met:  02/12/15 Pt wants full code Goal: Pain controlled with appropriate interventions Outcome: Progressing No evidence of pain until noxious stimuli Goal: Hemodynamically stable Outcome: Progressing No pressors.  VS stable.  Tolerating metoprolol and lisinopril  Goal: Baseline oxygen/pH stable Outcome: Not Applicable Date Met:  14/38/88 Will check ABG in am Goal: Patient tolerating nututrition at goal Outcome: Progressing TF started at 50m/hr with H20 flush 1045mq 8hr Goal: Progressing towards optiumm acitivities Outcome: Not Progressing On bedrest. Bed on rotation and pt turns to check skin Goal: Patient tolerating weaning plan Outcome: Progressing Fio2 decreased to 40 %. Wake up assessment done briefly/ Pt able to follow commands but very anxious Goal: Voiding-avoid urinary catheter unless indicated Outcome: Not Progressing Foley cath was inserted yesterday. UOP noted to be decreased this afternoon even after maintainence IV added.Foley irrigated with 2054mS  And 400m2moudy yellow urine returned Goal: Other Phase I Outcomes/Goals Outcome: Progressing Due to inclement weather wife has called to check on husband but did not make it to visit.  Wife did speak with pt on phone yesterday prior to intubation when they made that decision.

## 2015-02-13 ENCOUNTER — Inpatient Hospital Stay: Payer: Medicare Other

## 2015-02-13 LAB — BLOOD GAS, ARTERIAL
ACID-BASE EXCESS: 9.4 mmol/L — AB (ref 0.0–3.0)
BICARBONATE: 34.8 meq/L — AB (ref 21.0–28.0)
FIO2: 0.4
MECHANICAL RATE: 20
O2 Saturation: 98.5 %
PATIENT TEMPERATURE: 37
PEEP/CPAP: 5 cmH2O
PO2 ART: 110 mmHg — AB (ref 83.0–108.0)
VT: 600 mL
pCO2 arterial: 49 mmHg — ABNORMAL HIGH (ref 32.0–48.0)
pH, Arterial: 7.46 — ABNORMAL HIGH (ref 7.350–7.450)

## 2015-02-13 LAB — BASIC METABOLIC PANEL
ANION GAP: 8 (ref 5–15)
BUN: 28 mg/dL — AB (ref 6–20)
CO2: 34 mmol/L — ABNORMAL HIGH (ref 22–32)
Calcium: 8.6 mg/dL — ABNORMAL LOW (ref 8.9–10.3)
Chloride: 101 mmol/L (ref 101–111)
Creatinine, Ser: 1.36 mg/dL — ABNORMAL HIGH (ref 0.61–1.24)
Glucose, Bld: 167 mg/dL — ABNORMAL HIGH (ref 65–99)
POTASSIUM: 4 mmol/L (ref 3.5–5.1)
SODIUM: 143 mmol/L (ref 135–145)

## 2015-02-13 LAB — CBC
HCT: 49.7 % (ref 40.0–52.0)
Hemoglobin: 15.9 g/dL (ref 13.0–18.0)
MCH: 26.2 pg (ref 26.0–34.0)
MCHC: 31.9 g/dL — ABNORMAL LOW (ref 32.0–36.0)
MCV: 82.1 fL (ref 80.0–100.0)
PLATELETS: 167 10*3/uL (ref 150–440)
RBC: 6.06 MIL/uL — AB (ref 4.40–5.90)
RDW: 15.4 % — ABNORMAL HIGH (ref 11.5–14.5)
WBC: 13.5 10*3/uL — AB (ref 3.8–10.6)

## 2015-02-13 LAB — GLUCOSE, CAPILLARY
GLUCOSE-CAPILLARY: 166 mg/dL — AB (ref 65–99)
GLUCOSE-CAPILLARY: 171 mg/dL — AB (ref 65–99)
GLUCOSE-CAPILLARY: 210 mg/dL — AB (ref 65–99)
GLUCOSE-CAPILLARY: 177 mg/dL — AB (ref 65–99)
Glucose-Capillary: 183 mg/dL — ABNORMAL HIGH (ref 65–99)
Glucose-Capillary: 188 mg/dL — ABNORMAL HIGH (ref 65–99)

## 2015-02-13 MED ORDER — ENOXAPARIN SODIUM 40 MG/0.4ML ~~LOC~~ SOLN
40.0000 mg | Freq: Two times a day (BID) | SUBCUTANEOUS | Status: DC
Start: 2015-02-13 — End: 2015-02-21
  Administered 2015-02-13 – 2015-02-20 (×16): 40 mg via SUBCUTANEOUS
  Filled 2015-02-13 (×17): qty 0.4

## 2015-02-13 NOTE — Progress Notes (Signed)
ANTIBIOTIC CONSULT NOTE - FOLLOW UP   Pharmacy Consult for Levaquin Indication: rule out pneumonia  Allergies  Allergen Reactions  . Penicillins Anaphylaxis and Other (See Comments)    Unable to obtain enough information to answer additional questions about this medication.      Patient Measurements: Height: 6' (182.9 cm) Weight: 294 lb 15.6 oz (133.8 kg) IBW/kg (Calculated) : 77.6  Vital Signs: Temp: 98 F (36.7 C) (01/08 0900) Temp Source: Oral (01/08 0900) BP: 110/76 mmHg (01/08 0900) Pulse Rate: 76 (01/08 0900) Intake/Output from previous day: 01/07 0701 - 01/08 0700 In: 2663.1 [I.V.:1918.1; NG/GT:645; IV Piggyback:100] Out: 2020 [Urine:1870; Emesis/NG output:150] Intake/Output from this shift:    Labs:  Recent Labs  02/11/15 0638 02/12/15 0458 02/13/15 0402  WBC 5.9 7.8 13.5*  HGB 17.5 15.9 15.9  PLT 172 169 167  CREATININE 1.29* 1.64* 1.36*   Estimated Creatinine Clearance: 98.1 mL/min (by C-G formula based on Cr of 1.36). No results for input(s): VANCOTROUGH, VANCOPEAK, VANCORANDOM, GENTTROUGH, GENTPEAK, GENTRANDOM, TOBRATROUGH, TOBRAPEAK, TOBRARND, AMIKACINPEAK, AMIKACINTROU, AMIKACIN in the last 72 hours.   Microbiology: Recent Results (from the past 720 hour(s))  Culture, blood (routine x 2)     Status: None (Preliminary result)   Collection Time: 02/11/15  6:38 AM  Result Value Ref Range Status   Specimen Description BLOOD RIGHT HAND  Final   Special Requests BOTTLES DRAWN AEROBIC AND ANAEROBIC  1CC  Final   Culture NO GROWTH 2 DAYS  Final   Report Status PENDING  Incomplete  Culture, blood (routine x 2)     Status: None (Preliminary result)   Collection Time: 02/11/15  8:53 AM  Result Value Ref Range Status   Specimen Description BLOOD LEFT HAND  Final   Special Requests BOTTLES DRAWN AEROBIC AND ANAEROBIC  2CC  Final   Culture NO GROWTH 2 DAYS  Final   Report Status PENDING  Incomplete  MRSA PCR Screening     Status: None   Collection Time:  02/11/15  2:00 PM  Result Value Ref Range Status   MRSA by PCR NEGATIVE NEGATIVE Final    Comment:        The GeneXpert MRSA Assay (FDA approved for NASAL specimens only), is one component of a comprehensive MRSA colonization surveillance program. It is not intended to diagnose MRSA infection nor to guide or monitor treatment for MRSA infections.   Culture, expectorated sputum-assessment     Status: None   Collection Time: 02/12/15  4:02 PM  Result Value Ref Range Status   Specimen Description EXPECTORATED SPUTUM  Final   Special Requests NONE  Final   Sputum evaluation THIS SPECIMEN IS ACCEPTABLE FOR SPUTUM CULTURE  Final   Report Status 02/12/2015 FINAL  Final  Culture, respiratory (NON-Expectorated)     Status: None (Preliminary result)   Collection Time: 02/12/15  4:02 PM  Result Value Ref Range Status   Specimen Description EXPECTORATED SPUTUM  Final   Special Requests NONE Reflexed from [id number]  Final   Gram Stain PENDING  Incomplete   Culture NO GROWTH < 24 HOURS  Final   Report Status PENDING  Incomplete    Medical History: Past Medical History  Diagnosis Date  . Hypertension   . Diabetes mellitus without complication ([place])   . COPD (chronic obstructive pulmonary disease) ([place])   . Hypercholesteremia unk  . Coronary artery disease   . Stroke ([place])   . Sleep apnea     does not use CPAP regularly.  . Sarcoidosis (  [place])     Medications:  Scheduled:  . antiseptic oral rinse  7 mL Mouth Rinse QID  . aspirin  81 mg Oral Daily  . atorvastatin  40 mg Oral QHS  . azithromycin  500 mg Oral Daily  . budesonide (PULMICORT) nebulizer solution  0.25 mg Nebulization BID  . chlorhexidine gluconate  15 mL Mouth Rinse BID  . [START ON 02/18/2015] cloNIDine  0.1 mg Transdermal Weekly  . enoxaparin (LOVENOX) injection  40 mg Subcutaneous BID  . famotidine  20 mg Per Tube Daily  . feeding supplement (PRO-STAT SUGAR FREE 64)  30 mL Per Tube Daily  . feeding supplement (VITAL  HIGH PROTEIN)  1,000 mL Per Tube Q24H  . free water  100 mL Per Tube 3 times per day  . insulin aspart  0-15 Units Subcutaneous 6 times per day  . ipratropium-albuterol  3 mL Nebulization Q4H  . isosorbide dinitrate  10 mg Oral TID  . levofloxacin (LEVAQUIN) IV  500 mg Intravenous Q24H  . lisinopril  10 mg Oral Daily  . methylPREDNISolone (SOLU-MEDROL) injection  80 mg Intravenous Q6H  . metoprolol tartrate  25 mg Oral Q6H   Infusions:  . sodium chloride 75 mL/hr at 02/13/15 0226  . propofol (DIPRIVAN) infusion 50 mcg/kg/min (02/13/15 0830)    Assessment: 45 y/o M admitted with COPD exacerbation, r/o PNA.  Plan:  Continue Levofloxacin 500 mg IV q24 hours.  Will continue to follow renal function and culture results.   Akyra Bouchie D 02/13/2015,11:09 AM

## 2015-02-13 NOTE — Progress Notes (Signed)
[name]    ASSESSMENT/PLAN    Assessment and Plan:  Acute exacerbation of COPD with acute hypercapnic respiratory failure. -Patient was intubated on 02/12/2015. Remains with acute respiratory failure.  Vent dependent respiratory failure, setting reviewed, will continue current settings. We'll consider weaning trial and extubate if tolerated.  Check sputum culture.  Azithromycin was discontinued as his coverages duplicate with Levaquin.  Sarcoidosis. -Appears to be stable at this time. We'll continue the patient on steroids.  Continue tube feeds. Continue Pepcid for GI prophylaxis. Continue Lovenox DVT prophylaxis. Patient is adequately sedated with propofol, will continue. Intubated 02/11/15    MICRO DATA: Blood culture again was 07/26/2015 negative. Sputum culture 02/12/2015: Pending     ---------------------------------------   ----------------------------------------   Name: [name] MRN: [id number] DOB: September 30, 1970    ADMISSION DATE:  02/11/2015     SUBJECTIVE:   Pt currently on the ventilator, can not provide history or review of systems. It were no acute events overnight, the patient continues to be adequately sedated, appears to be resting comfortably.  Review of Systems:  Constitutional: Feels well. Cardiovascular: No chest pain.  Pulmonary: Denies dyspnea.   The remainder of systems were reviewed and were found to be negative other than what is documented in the HPI.    VITAL SIGNS: Temp:  [98 F (36.7 C)-98.4 F (36.9 C)] 98 F (36.7 C) (01/08 0900) Pulse Rate:  [75-103] 76 (01/08 0900) Resp:  [18-21] 20 (01/08 0900) BP: (110-129)/(68-93) 110/76 mmHg (01/08 0900) SpO2:  [90 %-98 %] 94 % (01/08 1115) FiO2 (%):  [30 %-45 %] 30 % (01/08 1117) Weight:  [294 lb 15.6 oz (133.8 kg)] 294 lb 15.6 oz (133.8 kg) (01/08 0500) HEMODYNAMICS:   VENTILATOR SETTINGS: Vent Mode:  [-] PRVC FiO2 (%):  [30  %-45 %] 30 % Set Rate:  [14 bmp-20 bmp] 14 bmp Vt Set:  [600 mL] 600 mL PEEP:  [5 cmH20] 5 cmH20 Plateau Pressure:  [18 cmH20-20 cmH20] 20 cmH20 INTAKE / OUTPUT:  Intake/Output Summary (Last 24 hours) at 02/13/15 1243 Last data filed at 02/13/15 0700  Gross per 24 hour  Intake 2415.45 ml  Output   1745 ml  Net 670.45 ml    PHYSICAL EXAMINATION: Physical Examination:   VS: BP 110/76 mmHg  Pulse 76  Temp(Src) 98 F (36.7 C) (Oral)  Resp 20  Ht 6' (1.829 m)  Wt 294 lb 15.6 oz (133.8 kg)  BMI 40.00 kg/m2  SpO2 94%  General Appearance: No distress  Neuro:without focal findings,  HEENT: PERRLA, EOM intact. Pulmonary: normal breath sounds   CardiovascularNormal S1,S2.  No m/r/g.   Abdomen: Benign, Soft, non-tender. Renal:  No costovertebral tenderness  GU:  Not performed at this time. Endocrine: No evident thyromegaly. Skin:   warm, no rashes, no ecchymosis  Extremities: normal, no cyanosis, clubbing.   LABS:   LABORATORY PANEL:   CBC  Recent Labs Lab 02/13/15 0402  WBC 13.5*  HGB 15.9  HCT 49.7  PLT 167    Chemistries   Recent Labs Lab 02/13/15 0402  NA 143  K 4.0  CL 101  CO2 34*  GLUCOSE 167*  BUN 28*  CREATININE 1.36*  CALCIUM 8.6*     Recent Labs Lab 02/12/15 1801 02/12/15 1933 02/12/15 2325 02/13/15 0440 02/13/15 0718 02/13/15 1147  GLUCAP 171* 166* 153* 166* 171* 183*    Recent Labs Lab 02/11/15 1539 02/11/15 1705 02/13/15 0342  PHART 7.21* 7.27* 7.46*  PCO2ART  100* 81* 49*  PO2ART 61* 87 110*   No results for input(s): AST, ALT, ALKPHOS, BILITOT, ALBUMIN in the last 168 hours.  Cardiac Enzymes  Recent Labs Lab 02/11/15 0638  TROPONINI <0.03    RADIOLOGY:  Dg Chest 1 View  02/13/2015  CLINICAL DATA:  Patient status post intubation. EXAM: CHEST 1 VIEW COMPARISON:  Chest radiograph 02/11/2015. FINDINGS: ET tube terminates in the mid trachea. Enteric tube courses inferior to the diaphragm. Stable cardiac and  mediastinal contours. Low lung volumes. Unchanged heterogeneous opacity right lung base. No pleural effusion or pneumothorax. IMPRESSION: ET tube terminates in the mid trachea. Unchanged focal consolidation right lung base which is favored to represent an area of scarring. Electronically Signed   By: Lovey Newcomer M.D.   On: 02/13/2015 09:06   Dg Abd 1 View  02/11/2015  CLINICAL DATA:  Nasogastric tube placement. EXAM: ABDOMEN - 1 VIEW COMPARISON:  None. FINDINGS: Nasogastric tube is adequately positioned within the stomach, tip likely in the mid stomach body region. Lung bases are grossly clear. Visualized bowel gas pattern is nonobstructive. IMPRESSION: Nasogastric tube appears adequately positioned in the stomach. Electronically Signed   By: Franki Cabot M.D.   On: 02/11/2015 17:51   Dg Chest Port 1 View  02/11/2015  CLINICAL DATA:  Intubation EXAM: PORTABLE CHEST 1 VIEW COMPARISON:  Chest x-ray from earlier same day and chest x-ray dated 02/09/2015. FINDINGS: Endotracheal tube is well positioned with tip approximately 4 cm above the level of the carina. Nasogastric tube passes below the diaphragm. Heart size is upper normal. Overall cardiomediastinal silhouette is stable in size and configuration. Chronic scarring is again noted at the right lung base. No new lung findings. No evidence of pneumonia. No pneumothorax. Osseous structures about the chest are unremarkable. IMPRESSION: 1. Endotracheal tube well positioned with tip approximately 4 cm above the level of the carina. 2. Nasogastric tube in the stomach. 3. No acute-appearing pulmonary abnormality. Electronically Signed   By: Franki Cabot M.D.   On: 02/11/2015 17:54       --Marda Stalker, MD.  Pager [phone] [facility]   Patricia Pesa, M.D.  Vilinda Boehringer, M.D.  Merton Border, M.D  [place].  I have personally obtained a history, examined the patient, evaluated laboratory and imaging  results, formulated the assessment and plan and placed orders. The Patient requires high complexity decision making for assessment and support, frequent evaluation and titration of therapies, application of advanced monitoring technologies and extensive interpretation of multiple databases. The patient has critical illness that could lead imminently to failure of 1 or more organ systems and requires the highest level of physician preparedness to intervene.  Critical Care Time devoted to patient care services described in this [name] is 35 minutes and is exclusive of time spent in procedures.

## 2015-02-13 NOTE — Progress Notes (Signed)
Enoxaparin   Patient qualifies for Enoxaparin 40 mg BID  based on CrCl >30 ml/min and BMI [id number]  per policy. Body mass index is 40 kg/(m^2). Will change to Enoxaparin 40 mg SQ BID  Larene Beach, PharmD

## 2015-02-13 NOTE — Progress Notes (Signed)
[facility] [place] NAME: [name]    MR#:  [id number]  DATE OF BIRTH:  04-Dec-1970  SUBJECTIVE:  CHIEF COMPLAINT:   Chief Complaint  Patient presents with  . Shortness of Breath   patient is 45 year old African-American male with history of hypertension, COPD, diabetes, coronary artery disease, hyperlipidemia who presented to the hospital with complaints of cough, shortness of breath worsening over a period of time and not improving with antibiotic therapy. Patient is admitted to the hospital and eventually intubated due to worsening shortness of breath. He remains on mechanical ventilation. Review of system is unavailable. Patient is on 30% FiO2, respiratory therapy tried to lighten up sedation, however, the patient started teaching for ET tube so he was placed back on sedation.   Review of Systems  Unable to perform ROS: intubated    VITAL SIGNS: Blood pressure 110/76, pulse 76, temperature 98 F (36.7 C), temperature source Oral, resp. rate 20, height 6' (1.829 m), weight 133.8 kg (294 lb 15.6 oz), SpO2 94 %.  PHYSICAL EXAMINATION:   GENERAL:  45 y.o.-year-old patient lying in the bed with no acute distress. On mechanical ventilation ET tube was pulled back by about an inch due to diminished left-sided breath sounds, although chest x-ray was unremarkable EYES: Pupils equal, round, reactive to light and accommodation. No scleral icterus. Extraocular muscles intact.  HEENT: Head atraumatic, normocephalic. Oropharynx and nasopharynx clear.  NECK:  Supple, no jugular venous distention. No thyroid enlargement, no tenderness.  LUNGS: Diminished breath sounds bilaterally, bilateral scattered rhonchi , no crepitations. No use of accessory muscles of respiration.  CARDIOVASCULAR: S1, S2 normal. No murmurs, rubs, or gallops.  ABDOMEN: Soft, nontender, nondistended. Bowel sounds present. No organomegaly or mass.  EXTREMITIES: No pedal  edema, cyanosis, or clubbing.  NEUROLOGIC: Cranial nerves II through XII are intact. Muscle strength 5/5 in all extremities. Sensation intact. Gait not checked.  PSYCHIATRIC: The patient is sedated  SKIN: No obvious rash, lesion, or ulcer.   ORDERS/RESULTS REVIEWED:   CBC  Recent Labs Lab 02/09/15 0734 02/11/15 0638 02/12/15 0458 02/13/15 0402  WBC 4.9 5.9 7.8 13.5*  HGB 16.5 17.5 15.9 15.9  HCT 52.1* 54.5* 50.0 49.7  PLT 171 172 169 167  MCV 83.9 85.9 85.0 82.1  MCH 26.6 27.5 27.0 26.2  MCHC 31.7* 32.1 31.8* 31.9*  RDW 14.9* 15.1* 14.8* 15.4*   ------------------------------------------------------------------------------------------------------------------  Chemistries   Recent Labs Lab 02/09/15 0734 02/11/15 0638 02/12/15 0458 02/13/15 0402  NA 142 143 141 143  K 4.2 4.4 3.8 4.0  CL 104 101 100* 101  CO2 36* 41* 30 34*  GLUCOSE 118* 137* 162* 167*  BUN 16 16 22* 28*  CREATININE 1.35* 1.29* 1.64* 1.36*  CALCIUM 8.4* 8.8* 8.7* 8.6*   ------------------------------------------------------------------------------------------------------------------ estimated creatinine clearance is 98.1 mL/min (by C-G formula based on Cr of 1.36). ------------------------------------------------------------------------------------------------------------------ No results for input(s): TSH, T4TOTAL, T3FREE, THYROIDAB in the last 72 hours.  Invalid input(s): FREET3  Cardiac Enzymes  Recent Labs Lab 02/09/15 0734 02/11/15 0638  TROPONINI <0.03 <0.03   ------------------------------------------------------------------------------------------------------------------ Invalid input(s): POCBNP ---------------------------------------------------------------------------------------------------------------  RADIOLOGY: Dg Chest 1 View  02/13/2015  CLINICAL DATA:  Patient status post intubation. EXAM: CHEST 1 VIEW COMPARISON:  Chest radiograph 02/11/2015. FINDINGS: ET tube terminates  in the mid trachea. Enteric tube courses inferior to the diaphragm. Stable cardiac and mediastinal contours. Low lung volumes. Unchanged heterogeneous opacity right lung base. No pleural effusion or pneumothorax. IMPRESSION: ET tube terminates  in the mid trachea. Unchanged focal consolidation right lung base which is favored to represent an area of scarring. Electronically Signed   By: Lovey Newcomer M.D.   On: 02/13/2015 09:06   Dg Abd 1 View  02/11/2015  CLINICAL DATA:  Nasogastric tube placement. EXAM: ABDOMEN - 1 VIEW COMPARISON:  None. FINDINGS: Nasogastric tube is adequately positioned within the stomach, tip likely in the mid stomach body region. Lung bases are grossly clear. Visualized bowel gas pattern is nonobstructive. IMPRESSION: Nasogastric tube appears adequately positioned in the stomach. Electronically Signed   By: Franki Cabot M.D.   On: 02/11/2015 17:51   Dg Chest Port 1 View  02/11/2015  CLINICAL DATA:  Intubation EXAM: PORTABLE CHEST 1 VIEW COMPARISON:  Chest x-ray from earlier same day and chest x-ray dated 02/09/2015. FINDINGS: Endotracheal tube is well positioned with tip approximately 4 cm above the level of the carina. Nasogastric tube passes below the diaphragm. Heart size is upper normal. Overall cardiomediastinal silhouette is stable in size and configuration. Chronic scarring is again noted at the right lung base. No new lung findings. No evidence of pneumonia. No pneumothorax. Osseous structures about the chest are unremarkable. IMPRESSION: 1. Endotracheal tube well positioned with tip approximately 4 cm above the level of the carina. 2. Nasogastric tube in the stomach. 3. No acute-appearing pulmonary abnormality. Electronically Signed   By: Franki Cabot M.D.   On: 02/11/2015 17:54    EKG:  Orders placed or performed during the hospital encounter of 02/11/15  . ED EKG  . ED EKG  . EKG 12-Lead  . EKG 12-Lead    ASSESSMENT AND PLAN:  Active Problems:   Acute respiratory  failure with hypoxia and hypercarbia (HCC)   COPD exacerbation (HCC)   Dyspnea  #1. Acute on chronic respiratory failure with hypoxia and hypercapnia, status post intubation and mechanical ventilation, continue patient on duo nebs, Pulmicort, levofloxacin as well as Zithromax, steroid injections, some better clinically, appreciate pulmonologist input #2. Acute bronchitis, continue antibiotic therapy. sputum cultures showed no growth in less than 24 hours, MRSA screen by PCR was negative, as cultures were negative for 2 days 2 #3. COPD exacerbation, as above. Continue therapy. Some improvement clinically #4. Diabetes mellitus type 2. Continue patient on Levemir and NovoLog, sliding scale insulin. Blood glucose levels are ranging between 160s to 200s #5 renal insufficiency, chronic , stage II , stable follow patient's creatinine closely, off Lasix, not on IV fluids due to peripheral edema   Management plans discussed with the patient, family and they are in agreement.   DRUG ALLERGIES:  Allergies  Allergen Reactions  . Penicillins Anaphylaxis and Other (See Comments)    Unable to obtain enough information to answer additional questions about this medication.      CODE STATUS:     Code Status Orders        Start     Ordered   02/11/15 1417  Full code   Continuous     02/11/15 1417      TOTAL CRITICAL CARE TIME TAKING CARE OF THIS PATIENT: 40 minutes.    Theodoro Grist M.D on 02/13/2015 at 12:33 PM  Between 7am to 6pm - Pager - [phone]  After 6pm go to [email or web address] - password [email or web address] [facility]  Office  [phone]  CC: Primary care physician; [facility]

## 2015-02-13 NOTE — Progress Notes (Signed)
At approx 0830, patients "wife" called and wanted an update on her husband. There is no password set up so I wouldn't give her the information she wanted. She then asked to speak to my supervisor. Katie spoke with her and verified that without a password I couldn't give her information. She hung up on Katie.

## 2015-02-14 ENCOUNTER — Inpatient Hospital Stay: Payer: Medicare Other

## 2015-02-14 LAB — BLOOD GAS, VENOUS
Acid-Base Excess: 6.9 mmol/L — ABNORMAL HIGH (ref 0.0–3.0)
BICARBONATE: 39.4 meq/L — AB (ref 21.0–28.0)
DELIVERY SYSTEMS: POSITIVE
FIO2: 32
MECHANICAL RATE: 12
O2 Saturation: 89.9 %
PATIENT TEMPERATURE: 37
pCO2, Ven: 94 mmHg (ref 44.0–60.0)
pH, Ven: 7.23 — ABNORMAL LOW (ref 7.320–7.430)

## 2015-02-14 LAB — GLUCOSE, CAPILLARY
GLUCOSE-CAPILLARY: 205 mg/dL — AB (ref 65–99)
GLUCOSE-CAPILLARY: 154 mg/dL — AB (ref 65–99)
GLUCOSE-CAPILLARY: 168 mg/dL — AB (ref 65–99)
Glucose-Capillary: 186 mg/dL — ABNORMAL HIGH (ref 65–99)
Glucose-Capillary: 145 mg/dL — ABNORMAL HIGH (ref 65–99)

## 2015-02-14 LAB — BLOOD GAS, ARTERIAL
ACID-BASE EXCESS: 7.4 mmol/L — AB (ref 0.0–3.0)
Acid-Base Excess: 7.1 mmol/L — ABNORMAL HIGH (ref 0.0–3.0)
BICARBONATE: 42.2 meq/L — AB (ref 21.0–28.0)
Bicarbonate: 35.3 mEq/L — ABNORMAL HIGH (ref 21.0–28.0)
FIO2: 28
FIO2: 0.3
Mechanical Rate: 14
O2 SAT: 92 %
PATIENT TEMPERATURE: 37
PATIENT TEMPERATURE: 37
PCO2 ART: 124 mmHg — AB (ref 32.0–48.0)
PCO2 ART: 64 mmHg — AB (ref 32.0–48.0)
PEEP/CPAP: 5 cmH2O
PH ART: 7.35 (ref 7.350–7.450)
PO2 ART: 67 mmHg — AB (ref 83.0–108.0)
VT: 600 mL
pH, Arterial: 7.14 — CL (ref 7.350–7.450)

## 2015-02-14 LAB — CBC
HEMATOCRIT: 55.1 % — AB (ref 40.0–52.0)
Hemoglobin: 17.3 g/dL (ref 13.0–18.0)
MCH: 26.4 pg (ref 26.0–34.0)
MCHC: 31.4 g/dL — ABNORMAL LOW (ref 32.0–36.0)
MCV: 84.2 fL (ref 80.0–100.0)
PLATELETS: 187 10*3/uL (ref 150–440)
RBC: 6.54 MIL/uL — AB (ref 4.40–5.90)
RDW: 15.6 % — ABNORMAL HIGH (ref 11.5–14.5)
WBC: 18.4 10*3/uL — AB (ref 3.8–10.6)

## 2015-02-14 LAB — BASIC METABOLIC PANEL
ANION GAP: 5 (ref 5–15)
BUN: 33 mg/dL — ABNORMAL HIGH (ref 6–20)
CO2: 37 mmol/L — ABNORMAL HIGH (ref 22–32)
Calcium: 8.5 mg/dL — ABNORMAL LOW (ref 8.9–10.3)
Chloride: 101 mmol/L (ref 101–111)
Creatinine, Ser: 1.16 mg/dL (ref 0.61–1.24)
GLUCOSE: 215 mg/dL — AB (ref 65–99)
POTASSIUM: 4.6 mmol/L (ref 3.5–5.1)
Sodium: 143 mmol/L (ref 135–145)

## 2015-02-14 MED ORDER — METHYLPREDNISOLONE SODIUM SUCC 40 MG IJ SOLR
40.0000 mg | Freq: Two times a day (BID) | INTRAMUSCULAR | Status: DC
  Administered 2015-02-14 – 2015-02-17 (×6): 40 mg via INTRAVENOUS
  Filled 2015-02-14 (×6): qty 1

## 2015-02-14 MED ORDER — STERILE WATER FOR INJECTION IJ SOLN
INTRAMUSCULAR | Status: AC
  Administered 2015-02-14: 10 mL via INTRAMUSCULAR
  Filled 2015-02-14: qty 10

## 2015-02-14 MED ORDER — SENNOSIDES-DOCUSATE SODIUM 8.6-50 MG PO TABS
2.0000 | ORAL_TABLET | Freq: Two times a day (BID) | ORAL | Status: DC
  Administered 2015-02-14 – 2015-02-15 (×4): 2 via ORAL
  Filled 2015-02-14 (×5): qty 2

## 2015-02-14 MED ORDER — BUDESONIDE 0.5 MG/2ML IN SUSP
0.5000 mg | Freq: Two times a day (BID) | RESPIRATORY_TRACT | Status: DC
  Administered 2015-02-14 – 2015-02-21 (×14): 0.5 mg via RESPIRATORY_TRACT
  Filled 2015-02-14 (×15): qty 2

## 2015-02-14 MED ORDER — FAMOTIDINE 40 MG/5ML PO SUSR
20.0000 mg | Freq: Two times a day (BID) | ORAL | Status: DC
  Administered 2015-02-14 – 2015-02-16 (×5): 20 mg
  Filled 2015-02-14 (×9): qty 2.5

## 2015-02-14 MED ORDER — STERILE WATER FOR INJECTION IJ SOLN
10.0000 mL | Freq: Once | INTRAMUSCULAR | Status: AC
  Administered 2015-02-14: 10 mL via INTRAMUSCULAR

## 2015-02-14 MED ORDER — STERILE WATER FOR INJECTION IJ SOLN: 10.0000 mL | INTRAMUSCULAR | Status: DC | PRN

## 2015-02-14 MED ORDER — VECURONIUM BROMIDE 10 MG IV SOLR: 10.0000 mg | INTRAVENOUS | Status: DC | PRN

## 2015-02-14 MED ORDER — INSULIN DETEMIR 100 UNIT/ML ~~LOC~~ SOLN
8.0000 [IU] | SUBCUTANEOUS | Status: DC
  Administered 2015-02-14: 8 [IU] via SUBCUTANEOUS
  Filled 2015-02-14 (×3): qty 0.08

## 2015-02-14 MED ORDER — LORAZEPAM 2 MG/ML IJ SOLN
2.0000 mg | INTRAMUSCULAR | Status: DC | PRN
  Administered 2015-02-14: 2 mg via INTRAVENOUS
  Filled 2015-02-14: qty 1

## 2015-02-14 MED ORDER — VECURONIUM BROMIDE 10 MG IV SOLR
10.0000 mg | Freq: Once | INTRAVENOUS | Status: AC
  Administered 2015-02-14: 10 mg via INTRAVENOUS
  Filled 2015-02-14: qty 10

## 2015-02-14 MED ORDER — FENTANYL 2500MCG IN NS 250ML (10MCG/ML) PREMIX INFUSION
10.0000 ug/h | INTRAVENOUS | Status: DC
  Administered 2015-02-14: 225 ug/h via INTRAVENOUS
  Administered 2015-02-14: 125 ug/h via INTRAVENOUS
  Administered 2015-02-14: 225 ug/h via INTRAVENOUS
  Administered 2015-02-15: 100 ug/h via INTRAVENOUS
  Administered 2015-02-16: 10 ug/h via INTRAVENOUS
  Filled 2015-02-14 (×4): qty 250

## 2015-02-14 NOTE — Progress Notes (Signed)
[facility] Progess Note    ASSESSMENT/PLAN  45 yo AAM with acute resp failure from acute COPD exacerbation with h/o sarcoidosis, with  pneumonia  Assessment and Plan:  Acute exacerbation of COPD with acute hypercapnic respiratory failure/pneumonia -Respiratory Failure -continue Full MV support -continue Bronchodilator Therapy -Wean Fio2 and PEEP as tolerated -will perform SAT/SBT when respiratory parameters are met -continue steroids and ABx as prescribed   Sarcoidosis. -Appears to be stable at this time. We'll continue the patient on steroids.  Continue tube feeds. Continue Pepcid for GI prophylaxis. Continue Lovenox DVT prophylaxis. Patient is adequately sedated with propofol, will continue. Intubated 02/11/15  DVt/GI prx ordered   MICRO DATA: Blood culture again was 07/26/2015 negative. Sputum culture 02/12/2015: Pending      Name: ESTEVON FLUKE MRN: [id number] DOB: 05/01/1970    ADMISSION DATE:  02/11/2015     SUBJECTIVE:  Remains intubated,sedated Plan for SAT/SBt today  Review of Systems:  UNobtainable to critical illness   VITAL SIGNS: Temp:  [97.7 F (36.5 C)-98.1 F (36.7 C)] 97.9 F (36.6 C) (01/09 0500) Pulse Rate:  [76-90] 81 (01/09 0600) Resp:  [11-26] 14 (01/09 0600) BP: (104-132)/(72-95) 129/86 mmHg (01/09 0600) SpO2:  [92 %-96 %] 94 % (01/09 0600) FiO2 (%):  [30 %] 30 % (01/09 0409) HEMODYNAMICS:   VENTILATOR SETTINGS: Vent Mode:  [-] PRVC FiO2 (%):  [30 %] 30 % Set Rate:  [14 bmp] 14 bmp Vt Set:  [600 mL] 600 mL PEEP:  [5 cmH20] 5 cmH20 Plateau Pressure:  [21 cmH20] 21 cmH20 INTAKE / OUTPUT:  Intake/Output Summary (Last 24 hours) at 02/14/15 0819 Last data filed at 02/14/15 0600  Gross per 24 hour  Intake 3932.09 ml  Output   2450 ml  Net 1482.09 ml    PHYSICAL EXAMINATION: Physical Examination:   VS: BP 129/86 mmHg  Pulse 81  Temp(Src) 97.9 F (36.6 C) (Oral)  Resp 14  Ht 6' (1.829 m)   Wt 294 lb 15.6 oz (133.8 kg)  BMI 40.00 kg/m2  SpO2 94%  General Appearance: No distress  Neuro:without focal findings,  HEENT: PERRLA, EOM intact. Pulmonary: normal breath sounds   CardiovascularNormal S1,S2.  No m/r/g.   Abdomen: Benign, Soft, non-tender. Renal:  No costovertebral tenderness  GU:  Not performed at this time. Endocrine: No evident thyromegaly. Skin:   warm, no rashes, no ecchymosis  Extremities: normal, no cyanosis, clubbing.   LABS:   LABORATORY PANEL:   CBC  Recent Labs Lab 02/14/15 0357  WBC 18.4*  HGB 17.3  HCT 55.1*  PLT 187    Chemistries   Recent Labs Lab 02/14/15 0357  NA 143  K 4.6  CL 101  CO2 37*  GLUCOSE 215*  BUN 33*  CREATININE 1.16  CALCIUM 8.5*     Recent Labs Lab 02/13/15 1147 02/13/15 1629 02/13/15 1946 02/13/15 2315 02/14/15 0440 02/14/15 0745  GLUCAP 183* 188* 210* 177* 186* 205*    Recent Labs Lab 02/11/15 1705 02/13/15 0342 02/14/15 0500  PHART 7.27* 7.46* 7.35  PCO2ART 81* 49* 64*  PO2ART 87 110* 67*   No results for input(s): AST, ALT, ALKPHOS, BILITOT, ALBUMIN in the last 168 hours.  Cardiac Enzymes  Recent Labs Lab 02/11/15 0638  TROPONINI <0.03    RADIOLOGY:  Dg Chest 1 View  02/14/2015  CLINICAL DATA:  Acute respiratory failure, exacerbation of COPD, history of chronic respiratory failure, sarcoidosis, diabetes, current smoker. EXAM: CHEST 1 VIEW COMPARISON:  Portable chest x-ray  of February 13, 2015 FINDINGS: The lungs are borderline hypoinflated. The interstitial markings are coarse diffusely on the left and at the right lung base. This pattern is not greatly changed. The cardiac silhouette remains enlarged. The central pulmonary vascularity is prominent and there is mild cephalization of the vascular pattern. The endotracheal tube tip projects 5.1 cm above the crotch of the carina. The esophagogastric tube projects below the inferior margin of the image. IMPRESSION: Slightly decreased  inflation today. Persistent right basilar atelectasis or pneumonia. Mild interstitial prominence on the left slightly more conspicuous today. Overall there has not been dramatic interval change in the appearance of the chest. Electronically Signed   By: David  Martinique M.D.   On: 02/14/2015 07:42   Dg Chest 1 View  02/13/2015  CLINICAL DATA:  Patient status post intubation. EXAM: CHEST 1 VIEW COMPARISON:  Chest radiograph 02/11/2015. FINDINGS: ET tube terminates in the mid trachea. Enteric tube courses inferior to the diaphragm. Stable cardiac and mediastinal contours. Low lung volumes. Unchanged heterogeneous opacity right lung base. No pleural effusion or pneumothorax. IMPRESSION: ET tube terminates in the mid trachea. Unchanged focal consolidation right lung base which is favored to represent an area of scarring. Electronically Signed   By: Lovey Newcomer M.D.   On: 02/13/2015 09:06    I have personally obtained a history, examined the patient, evaluated Pertinent laboratory and RadioGraphic/imaging results, and  formulated the assessment and plan   The Patient requires high complexity decision making for assessment and support, frequent evaluation and titration of therapies, application of advanced monitoring technologies and extensive interpretation of multiple databases. Critical Care Time devoted to patient care services described in this note is 35 minutes.   Overall, patient is critically ill, prognosis is guarded.  Patient with Multiorgan failure and at high risk for cardiac arrest and death.    Corrin Parker, M.D.  [facility]  Medical Director [place] Director [facility] Cardio-Pulmonary Department

## 2015-02-14 NOTE — Progress Notes (Signed)
Pt remains intubated FiO2 currently 50% pts O2 sats mid 90's; attempted spontaneous breathing trial during shift pt able to follow commands however unable to tolerate spontaneous mode pt became tachypnic with O2 sats decreased to 85 to 88% therefore pt placed on full support per Dr. Zoila Shutter verbal orders; per Dr. Zoila Shutter orders administered 10 mg iv push Vecuronium once than per md placed prn order for Vecuronium 10 mg iv push q1hr; adequate uop via foley; sinus rhythm with BBB; vss; pts wife updated about plan of care and questions answered will continue to monitor and assess pt

## 2015-02-14 NOTE — Progress Notes (Signed)
Inpatient Diabetes Program Recommendations  AACE/ADA: New Consensus Statement on Inpatient Glycemic Control (2015)  Target Ranges:  Prepandial:   less than 140 mg/dL      Peak postprandial:   less than 180 mg/dL (1-2 hours)      Critically ill patients:  140 - 180 mg/dL   Review of Glycemic Control:  Results for DINO, WESTRUM (MRN [id number]) as of 02/14/2015 12:30  Ref. Range 02/13/2015 19:46 02/13/2015 23:15 02/14/2015 04:40 02/14/2015 07:45 02/14/2015 11:53  Glucose-Capillary Latest Ref Range: 65-99 mg/dL 210 (H) 177 (H) 186 (H) 205 (H) 154 (H)    Diabetes history: Type 2 diabetes Outpatient Diabetes medications: Levemir 40 units daily, Humalog 8 units tid with meals Current orders for Inpatient glycemic control:  Levemir 8 units q 24 hours, Novolog moderate q 4 hours, Solumedrol 40 mg q 12 hours  Inpatient Diabetes Program Recommendations:    Note that Levemir started today per pharmacy recs.  Note that patient was on Levemir prior to admit.  If CBG's remain greater than goal, may consider increasing Levemir to 1/2 of patient's home dose.  Consider Levemir 20 units daily.  Thanks, Adah Perl, RN, BC-ADM Inpatient Diabetes Coordinator Pager [phone] (8a-5p)

## 2015-02-14 NOTE — Progress Notes (Signed)
[place] at [place] NAME: [name]    MR#:  [id number]  DATE OF BIRTH:  [birth year]  SUBJECTIVE:  CHIEF COMPLAINT:   Chief Complaint  Patient presents with  . Shortness of Breath   patient is 45 year old African-American male with history of hypertension, COPD, diabetes, coronary artery disease, hyperlipidemia who presented to the hospital with complaints of cough, shortness of breath worsening over a period of time and not improving with antibiotic therapy. Patient is admitted to the hospital and eventually intubated due to worsening shortness of breath. He remains on mechanical ventilation. Review of system is unavailable. Failed weaning attempt Review of Systems  Unable to perform ROS: intubated    VITAL SIGNS: Blood pressure 121/77, pulse 87, temperature 98.5 F (36.9 C), temperature source Oral, resp. rate 14, height 6' (1.829 m), weight 135 kg (297 lb 9.9 oz), SpO2 97 %.  PHYSICAL EXAMINATION:   GENERAL:  45 y.o.-year-old patient lying in the bed with no acute distress. On mechanical ventilation ET tube was pulled back by about an inch due to diminished left-sided breath sounds, although chest x-ray was unremarkable EYES: Pupils equal, round, reactive to light and accommodation. No scleral icterus. Extraocular muscles intact.  HEENT: Head atraumatic, normocephalic. Oropharynx and nasopharynx clear.  NECK:  Supple, no jugular venous distention. No thyroid enlargement, no tenderness.  LUNGS: Diminished breath sounds bilaterally, bilateral scattered rhonchi , no crepitations, upper airway wheezing. No use of accessory muscles of respiration.  CARDIOVASCULAR: S1, S2 normal. No murmurs, rubs, or gallops.  ABDOMEN: Soft, nontender, nondistended. Bowel sounds present. No organomegaly or mass.  EXTREMITIES: No pedal edema, cyanosis, or clubbing.  NEUROLOGIC: Cranial nerves II through XII are intact. Muscle strength 5/5 in all  extremities. Sensation intact. Gait not checked.  PSYCHIATRIC: The patient is sedated  SKIN: No obvious rash, lesion, or ulcer.   ORDERS/RESULTS REVIEWED:   CBC  Recent Labs Lab 02/09/15 0734 02/11/15 [id number] 02/12/15 0458 02/13/15 0402 02/14/15 0357  WBC 4.9 5.9 7.8 13.5* 18.4*  HGB 16.5 17.5 15.9 15.9 17.3  HCT 52.1* 54.5* 50.0 49.7 55.1*  PLT 171 172 169 167 187  MCV 83.9 85.9 85.0 82.1 84.2  MCH 26.6 27.5 27.0 26.2 26.4  MCHC 31.7* 32.1 31.8* 31.9* 31.4*  RDW 14.9* 15.1* 14.8* 15.4* 15.6*   ------------------------------------------------------------------------------------------------------------------  Chemistries   Recent Labs Lab 02/09/15 0734 02/11/15 0638 02/12/15 0458 02/13/15 0402 02/14/15 0357  NA 142 143 141 143 143  K 4.2 4.4 3.8 4.0 4.6  CL 104 101 100* 101 101  CO2 36* 41* 30 34* 37*  GLUCOSE 118* 137* 162* 167* 215*  BUN 16 16 22* 28* 33*  CREATININE 1.35* 1.29* 1.64* 1.36* 1.16  CALCIUM 8.4* 8.8* 8.7* 8.6* 8.5*   ------------------------------------------------------------------------------------------------------------------ estimated creatinine clearance is 115.6 mL/min (by C-G formula based on Cr of 1.16). ------------------------------------------------------------------------------------------------------------------ No results for input(s): TSH, T4TOTAL, T3FREE, THYROIDAB in the last 72 hours.  Invalid input(s): FREET3  Cardiac Enzymes  Recent Labs Lab 02/09/15 0734 02/11/15 0638  TROPONINI <0.03 <0.03   ------------------------------------------------------------------------------------------------------------------ Invalid input(s): POCBNP ---------------------------------------------------------------------------------------------------------------  RADIOLOGY: Dg Chest 1 View  02/14/2015  CLINICAL DATA:  Acute respiratory failure, exacerbation of COPD, history of chronic respiratory failure, sarcoidosis, diabetes, current smoker.  EXAM: CHEST 1 VIEW COMPARISON:  Portable chest x-ray of February 13, 2015 FINDINGS: The lungs are borderline hypoinflated. The interstitial markings are coarse diffusely on the left and at the right lung base. This pattern is not greatly  changed. The cardiac silhouette remains enlarged. The central pulmonary vascularity is prominent and there is mild cephalization of the vascular pattern. The endotracheal tube tip projects 5.1 cm above the crotch of the carina. The esophagogastric tube projects below the inferior margin of the image. IMPRESSION: Slightly decreased inflation today. Persistent right basilar atelectasis or pneumonia. Mild interstitial prominence on the left slightly more conspicuous today. Overall there has not been dramatic interval change in the appearance of the chest. Electronically Signed   By: David  Martinique M.D.   On: 02/14/2015 07:42   Dg Chest 1 View  02/13/2015  CLINICAL DATA:  Patient status post intubation. EXAM: CHEST 1 VIEW COMPARISON:  Chest radiograph 02/11/2015. FINDINGS: ET tube terminates in the mid trachea. Enteric tube courses inferior to the diaphragm. Stable cardiac and mediastinal contours. Low lung volumes. Unchanged heterogeneous opacity right lung base. No pleural effusion or pneumothorax. IMPRESSION: ET tube terminates in the mid trachea. Unchanged focal consolidation right lung base which is favored to represent an area of scarring. Electronically Signed   By: Lovey Newcomer M.D.   On: 02/13/2015 09:06    EKG:  Orders placed or performed during the hospital encounter of 02/11/15  . ED EKG  . ED EKG  . EKG 12-Lead  . EKG 12-Lead    ASSESSMENT AND PLAN:  Active Problems:   Acute respiratory failure with hypoxia and hypercarbia (HCC)   COPD exacerbation (HCC)   Dyspnea  #1. Acute on chronic respiratory failure with hypoxia and hypercapnia, status post intubation and mechanical ventilation, continue patient on duo nebs, Pulmicort, levofloxacin as well as Zithromax,  steroid injections,  appreciate pulmonologist input, failed weaning attempt #2. Acute bronchitis, continue antibiotic therapy. sputum cultures showed normal flora, MRSA screen by PCR was negative, as cultures were negative for 2 days 2 #3. COPD exacerbation, as above. Continue therapy. Minimal improvement clinically #4. Diabetes mellitus type 2. Continue patient on Levemir and NovoLog, sliding scale insulin. Blood glucose levels are ranging between 160s to 200s #5 renal insufficiency, chronic , stage II , stable follow patient's creatinine closely, off Lasix, not on IV fluids due to peripheral edema   Management plans discussed with the patient, family and they are in agreement.   DRUG ALLERGIES:  Allergies  Allergen Reactions  . Penicillins Anaphylaxis and Other (See Comments)    Unable to obtain enough information to answer additional questions about this medication.      CODE STATUS:     Code Status Orders        Start     Ordered   02/11/15 1417  Full code   Continuous     02/11/15 1417      TOTAL CRITICAL CARE TIME TAKING CARE OF THIS PATIENT: 40 minutes.    Theodoro Grist M.D on 02/14/2015 at 4:10 PM  Between 7am to 6pm - Pager - [phone]  After 6pm go to [email or web address] - password [email or web address] [place] [facility]  Office  [phone]  CC: Primary care physician; [facility]

## 2015-02-14 NOTE — Progress Notes (Signed)
ANTIBIOTIC CONSULT NOTE - FOLLOW UP   Pharmacy Consult for ICU medication management Indication: rule out pneumonia  Allergies  Allergen Reactions  . Penicillins Anaphylaxis and Other (See Comments)    Unable to obtain enough information to answer additional questions about this medication.      Patient Measurements: Height: 6' (182.9 cm) Weight:  (scale on bed not working appropriately) IBW/kg (Calculated) : 77.6  Vital Signs: Temp: 98.4 F (36.9 C) (01/09 0836) Temp Source: Oral (01/09 0836) BP: 145/92 mmHg (01/09 1000) Pulse Rate: 86 (01/09 1002) Intake/Output from previous day: 01/08 0701 - 01/09 0700 In: 3962.1 [I.V.:2932.1; NG/GT:1030] Out: 2450 [Urine:2450] Intake/Output from this shift: Total I/O In: 30 [NG/GT:30] Out: 375 [Urine:275; Emesis/NG output:100]  Labs:  Recent Labs  02/12/15 0458 02/13/15 0402 02/14/15 0357  WBC 7.8 13.5* 18.4*  HGB 15.9 15.9 17.3  PLT 169 167 187  CREATININE 1.64* 1.36* 1.16   Estimated Creatinine Clearance: 115.1 mL/min (by C-G formula based on Cr of 1.16). No results for input(s): VANCOTROUGH, VANCOPEAK, VANCORANDOM, GENTTROUGH, GENTPEAK, GENTRANDOM, TOBRATROUGH, TOBRAPEAK, TOBRARND, AMIKACINPEAK, AMIKACINTROU, AMIKACIN in the last 72 hours.   Microbiology: Recent Results (from the past 720 hour(s))  Culture, blood (routine x 2)     Status: None (Preliminary result)   Collection Time: 02/11/15  6:38 AM  Result Value Ref Range Status   Specimen Description BLOOD RIGHT HAND  Final   Special Requests BOTTLES DRAWN AEROBIC AND ANAEROBIC  1CC  Final   Culture NO GROWTH 2 DAYS  Final   Report Status PENDING  Incomplete  Culture, blood (routine x 2)     Status: None (Preliminary result)   Collection Time: 02/11/15  8:53 AM  Result Value Ref Range Status   Specimen Description BLOOD LEFT HAND  Final   Special Requests BOTTLES DRAWN AEROBIC AND ANAEROBIC  2CC  Final   Culture NO GROWTH 2 DAYS  Final   Report Status PENDING   Incomplete  MRSA PCR Screening     Status: None   Collection Time: 02/11/15  2:00 PM  Result Value Ref Range Status   MRSA by PCR NEGATIVE NEGATIVE Final    Comment:        The GeneXpert MRSA Assay (FDA approved for NASAL specimens only), is one component of a comprehensive MRSA colonization surveillance program. It is not intended to diagnose MRSA infection nor to guide or monitor treatment for MRSA infections.   Culture, expectorated sputum-assessment     Status: None   Collection Time: 02/12/15  4:02 PM  Result Value Ref Range Status   Specimen Description EXPECTORATED SPUTUM  Final   Special Requests NONE  Final   Sputum evaluation THIS SPECIMEN IS ACCEPTABLE FOR SPUTUM CULTURE  Final   Report Status 02/12/2015 FINAL  Final  Culture, respiratory (NON-Expectorated)     Status: None (Preliminary result)   Collection Time: 02/12/15  4:02 PM  Result Value Ref Range Status   Specimen Description EXPECTORATED SPUTUM  Final   Special Requests NONE Reflexed from [id number]  Final   Gram Stain   Final    FAIR SPECIMEN - 70-80% WBCS FEW WBC SEEN FEW GRAM POSITIVE COCCI RARE YEAST RARE GRAM VARIABLE ROD    Culture Consistent with normal respiratory flora.  Final   Report Status PENDING  Incomplete    Medical History: Past Medical History  Diagnosis Date  . Hypertension   . Diabetes mellitus without complication ([place])   . COPD (chronic obstructive pulmonary disease) ([place])   .  Hypercholesteremia unk  . Coronary artery disease   . Stroke ([place])   . Sleep apnea     does not use CPAP regularly.  . Sarcoidosis ([place])     Medications:  Scheduled:  . antiseptic oral rinse  7 mL Mouth Rinse QID  . aspirin  81 mg Oral Daily  . atorvastatin  40 mg Oral QHS  . budesonide (PULMICORT) nebulizer solution  0.5 mg Nebulization BID  . chlorhexidine gluconate  15 mL Mouth Rinse BID  . [START ON 02/18/2015] cloNIDine  0.1 mg Transdermal Weekly  . enoxaparin (LOVENOX) injection  40 mg  Subcutaneous BID  . famotidine  20 mg Per Tube BID  . feeding supplement (PRO-STAT SUGAR FREE 64)  30 mL Per Tube Daily  . feeding supplement (VITAL HIGH PROTEIN)  1,000 mL Per Tube Q24H  . free water  100 mL Per Tube 3 times per day  . insulin aspart  0-15 Units Subcutaneous 6 times per day  . insulin detemir  8 Units Subcutaneous Q24H  . ipratropium-albuterol  3 mL Nebulization Q4H  . isosorbide dinitrate  10 mg Oral TID  . levofloxacin (LEVAQUIN) IV  500 mg Intravenous Q24H  . lisinopril  10 mg Oral Daily  . methylPREDNISolone (SOLU-MEDROL) injection  40 mg Intravenous Q12H  . metoprolol tartrate  25 mg Oral Q6H  . senna-docusate  2 tablet Oral BID   Infusions:  . fentaNYL infusion INTRAVENOUS 125 mcg/hr (02/14/15 1044)    Assessment: Pharmacy consulted for medication management, levofloxacin, constipation, electrolytes and glucose, for 45 yo male requiring mechanical ventilation. Patient's current sedation regimen is fentanyl drip with prn lorazepam.     Plan:  1. Levofloxacin: Patient is currently on day day 4 (first dose 1/6) of levofloxacin. Will continue patient on levofloxacin 500mg  IV Q24hr.   2. Constipation: Patient without documented bowel movement. Will start patient on docusate/senna 2 tabs BID. If no bowel movement on 1/9 will add bisacodyl suppository.   3. Electrolytes: electrolytes are currently WNL. Will obtain follow-up electrolytes with am labs on 1/10.   4. Glucose: Patient required 22 units of SSI in last 24 hours. Methylprednisolone decreased from 80mg  q6hr to 40 Q12hr. Will order Levemir 8 units Q24hr and continue SSI as ordered.    Pharmacy will continue to monitor and adjust per consult.    Simpson,Michael L 02/14/2015,11:12 AM

## 2015-02-14 NOTE — Care Management (Signed)
Admitted with exacerbation of COPD and within 24 hours of admit required intubation (02/11/2015).Has failed one wean attempt.  On propofol continuous infusion , IV steroids, antibiotics and bronchodilators.  followed by [facility]

## 2015-02-14 NOTE — Progress Notes (Signed)
Nutrition Follow-up   INTERVENTION:   EN:  Pt off diprivan at present, Recommend continuing to titrate TF as tolerated to goal rate of 60 ml/hr as per order set   NUTRITION DIAGNOSIS:   Inadequate oral intake related to acute illness as evidenced by NPO status.  Being addressed via TF  GOAL:   Provide needs based on ASPEN/SCCM guidelines  MONITOR:    (Energy Intake, Anthropometrics, Digestive System, Electrolyte/Renal Profile, Glucose Profile)  REASON FOR ASSESSMENT:   Ventilator    ASSESSMENT:    Pt remains on vent, failed weaning trial this AM, resedated, diprivan on hold at present,  Diet Order:  Diet NPO time specified   EN: tolerating Vital High Protein at rate of 30 ml/hr, increased to rate of 40 ml/hr per [facility]: no signs of TF intolerance, no BM  Electrolyte and Renal Profile:  Recent Labs Lab 02/12/15 0458 02/13/15 0402 02/14/15 0357  BUN 22* 28* 33*  CREATININE 1.64* 1.36* 1.16  NA 141 143 143  K 3.8 4.0 4.6   Glucose Profile:  Recent Labs  02/14/15 0440 02/14/15 0745 02/14/15 1153  GLUCAP 186* 205* 154*   Meds: ss novolog, levemir, solumedrol, senokot,   Height:   Ht Readings from Last 1 Encounters:  02/11/15 6' (1.829 m)    Weight:   Wt Readings from Last 1 Encounters:  02/14/15 297 lb 9.9 oz (135 kg)   Filed Weights   02/11/15 1420 02/13/15 0500 02/14/15 1100  Weight: 294 lb 15.6 oz (133.8 kg) 294 lb 15.6 oz (133.8 kg) 297 lb 9.9 oz (135 kg)    BMI:  Body mass index is 40.36 kg/(m^2).  Estimated Nutritional Needs:   Kcal:  [id number] (11-14 kcals/kg) using wt of 133.8  Protein:  122-162 g (1.5-2.0 g/kg) using IBW 81 kg  Fluid:  2025-2430 mL (25-30 ml/kg)   HIGH Care Level  Kerman Passey MS, RD, LDN [phone] Pager  [phone] Weekend/On-Call Pager

## 2015-02-15 ENCOUNTER — Inpatient Hospital Stay
Admit: 2015-02-15 | Discharge: 2015-02-15 | Disposition: A | Discharge status: Home / Self Care | Payer: Medicare Other | Attending: Internal Medicine | Admitting: Internal Medicine

## 2015-02-15 ENCOUNTER — Inpatient Hospital Stay: Payer: Medicare Other

## 2015-02-15 LAB — BASIC METABOLIC PANEL
ANION GAP: 7 (ref 5–15)
BUN: 51 mg/dL — ABNORMAL HIGH (ref 6–20)
CALCIUM: 8 mg/dL — AB (ref 8.9–10.3)
CO2: 37 mmol/L — ABNORMAL HIGH (ref 22–32)
Chloride: 100 mmol/L — ABNORMAL LOW (ref 101–111)
Creatinine, Ser: 1.52 mg/dL — ABNORMAL HIGH (ref 0.61–1.24)
GFR, EST NON AFRICAN AMERICAN: 54 mL/min — AB (ref >60)
GLUCOSE: 160 mg/dL — AB (ref 65–99)
Potassium: 5.6 mmol/L — ABNORMAL HIGH (ref 3.5–5.1)
Sodium: 144 mmol/L (ref 135–145)

## 2015-02-15 LAB — GLUCOSE, CAPILLARY
GLUCOSE-CAPILLARY: 168 mg/dL — AB (ref 65–99)
GLUCOSE-CAPILLARY: 186 mg/dL — AB (ref 65–99)
GLUCOSE-CAPILLARY: 239 mg/dL — AB (ref 65–99)
Glucose-Capillary: 148 mg/dL — ABNORMAL HIGH (ref 65–99)
Glucose-Capillary: 149 mg/dL — ABNORMAL HIGH (ref 65–99)
Glucose-Capillary: 262 mg/dL — ABNORMAL HIGH (ref 65–99)

## 2015-02-15 LAB — CULTURE, RESPIRATORY

## 2015-02-15 LAB — BLOOD GAS, ARTERIAL
ALLENS TEST (PASS/FAIL): POSITIVE — AB
FIO2: 0.5
MECHVT: 450 mL
Mechanical Rate: 14
PEEP/CPAP: 2 cmH2O
PH ART: 7.11 — AB (ref 7.350–7.450)
PO2 ART: 88 mmHg (ref 83.0–108.0)
Patient temperature: 37
RATE: 14 resp/min
pCO2 arterial: 130 mmHg (ref 32.0–48.0)

## 2015-02-15 LAB — CBC
HEMATOCRIT: 58.6 % — AB (ref 40.0–52.0)
HEMOGLOBIN: 17.9 g/dL (ref 13.0–18.0)
MCH: 26.1 pg (ref 26.0–34.0)
MCHC: 30.5 g/dL — ABNORMAL LOW (ref 32.0–36.0)
MCV: 85.7 fL (ref 80.0–100.0)
PLATELETS: 205 10*3/uL (ref 150–440)
RBC: 6.84 MIL/uL — AB (ref 4.40–5.90)
RDW: 15.8 % — AB (ref 11.5–14.5)
WBC: 19.8 10*3/uL — ABNORMAL HIGH (ref 3.8–10.6)

## 2015-02-15 LAB — CULTURE, RESPIRATORY W GRAM STAIN: Culture: NORMAL

## 2015-02-15 LAB — POTASSIUM: POTASSIUM: 4.9 mmol/L (ref 3.5–5.1)

## 2015-02-15 MED ORDER — LEVOFLOXACIN IN D5W 500 MG/100ML IV SOLN
500.0000 mg | INTRAVENOUS | Status: AC
  Administered 2015-02-16 – 2015-02-17 (×2): 500 mg via INTRAVENOUS
  Filled 2015-02-15 (×2): qty 100

## 2015-02-15 MED ORDER — INSULIN DETEMIR 100 UNIT/ML ~~LOC~~ SOLN
16.0000 [IU] | SUBCUTANEOUS | Status: AC
  Administered 2015-02-16: 16 [IU] via SUBCUTANEOUS
  Filled 2015-02-15: qty 0.16

## 2015-02-15 MED ORDER — BISACODYL 10 MG RE SUPP
10.0000 mg | Freq: Once | RECTAL | Status: AC
  Administered 2015-02-15: 10 mg via RECTAL
  Filled 2015-02-15: qty 1

## 2015-02-15 MED ORDER — SODIUM CHLORIDE 0.9 % IJ SOLN
10.0000 mL | INTRAMUSCULAR | Status: DC | PRN
  Administered 2015-02-19: 10 mL
  Filled 2015-02-15: qty 40

## 2015-02-15 MED ORDER — SODIUM POLYSTYRENE SULFONATE 15 GM/60ML PO SUSP
30.0000 g | Freq: Once | ORAL | Status: AC
  Administered 2015-02-15: 30 g via ORAL
  Filled 2015-02-15: qty 120

## 2015-02-15 MED ORDER — SODIUM CHLORIDE 0.9 % IJ SOLN
10.0000 mL | Freq: Two times a day (BID) | INTRAMUSCULAR | Status: DC
  Administered 2015-02-15 – 2015-02-17 (×4): 10 mL
  Administered 2015-02-17: 30 mL
  Administered 2015-02-18: 40 mL
  Administered 2015-02-18 – 2015-02-20 (×4): 10 mL
  Administered 2015-02-20: 20 mL
  Administered 2015-02-21: 10 mL

## 2015-02-15 MED ORDER — INSULIN DETEMIR 100 UNIT/ML ~~LOC~~ SOLN
12.0000 [IU] | SUBCUTANEOUS | Status: DC
  Administered 2015-02-15: 12 [IU] via SUBCUTANEOUS
  Filled 2015-02-15: qty 0.12

## 2015-02-15 NOTE — Progress Notes (Signed)
Pt  Remains intubated.  Pt will open eyes to voice and shake head to yes or no questions.  Tube feeds have been advanced to goal.  Pt tolerating them well.  Foley remains in place with 475cc out.  Pt remains afebrile.  Pt remains sedated with fentanyl.

## 2015-02-15 NOTE — Progress Notes (Signed)
ANTIBIOTIC CONSULT NOTE - FOLLOW UP   Pharmacy Consult for ICU medication management Indication: rule out pneumonia  Allergies  Allergen Reactions  . Penicillins Anaphylaxis and Other (See Comments)    Unable to obtain enough information to answer additional questions about this medication.      Patient Measurements: Height: 6' (182.9 cm) Weight: 295 lb 3.1 oz (133.9 kg) IBW/kg (Calculated) : 77.6  Vital Signs: Temp: 97.7 F (36.5 C) (01/10 0800) Temp Source: Oral (01/10 0800) BP: 76/48 mmHg (01/10 1400) Pulse Rate: 63 (01/10 1400) Intake/Output from previous day: 01/09 0701 - 01/10 0700 In: 1396.4 [I.V.:416.4; NG/GT:980] Out: 1425 [Urine:1125; Emesis/NG output:300] Intake/Output from this shift: Total I/O In: 325 [I.V.:45; NG/GT:180; IV Piggyback:100] Out: 600 [Urine:600]  Labs:  Recent Labs  02/13/15 0402 02/14/15 0357 02/15/15 0604  WBC 13.5* 18.4* 19.8*  HGB 15.9 17.3 17.9  PLT 167 187 205  CREATININE 1.36* 1.16 1.52*   Estimated Creatinine Clearance: 87.8 mL/min (by C-G formula based on Cr of 1.52). No results for input(s): VANCOTROUGH, VANCOPEAK, VANCORANDOM, GENTTROUGH, GENTPEAK, GENTRANDOM, TOBRATROUGH, TOBRAPEAK, TOBRARND, AMIKACINPEAK, AMIKACINTROU, AMIKACIN in the last 72 hours.   Microbiology: Recent Results (from the past 720 hour(s))  Culture, blood (routine x 2)     Status: None (Preliminary result)   Collection Time: 02/11/15  6:38 AM  Result Value Ref Range Status   Specimen Description BLOOD RIGHT HAND  Final   Special Requests BOTTLES DRAWN AEROBIC AND ANAEROBIC  1CC  Final   Culture NO GROWTH 4 DAYS  Final   Report Status PENDING  Incomplete  Culture, blood (routine x 2)     Status: None (Preliminary result)   Collection Time: 02/11/15  8:53 AM  Result Value Ref Range Status   Specimen Description BLOOD LEFT HAND  Final   Special Requests BOTTLES DRAWN AEROBIC AND ANAEROBIC  2CC  Final   Culture NO GROWTH 4 DAYS  Final   Report Status  PENDING  Incomplete  MRSA PCR Screening     Status: None   Collection Time: 02/11/15  2:00 PM  Result Value Ref Range Status   MRSA by PCR NEGATIVE NEGATIVE Final    Comment:        The GeneXpert MRSA Assay (FDA approved for NASAL specimens only), is one component of a comprehensive MRSA colonization surveillance program. It is not intended to diagnose MRSA infection nor to guide or monitor treatment for MRSA infections.   Culture, expectorated sputum-assessment     Status: None   Collection Time: 02/12/15  4:02 PM  Result Value Ref Range Status   Specimen Description EXPECTORATED SPUTUM  Final   Special Requests NONE  Final   Sputum evaluation THIS SPECIMEN IS ACCEPTABLE FOR SPUTUM CULTURE  Final   Report Status 02/12/2015 FINAL  Final  Culture, respiratory (NON-Expectorated)     Status: None   Collection Time: 02/12/15  4:02 PM  Result Value Ref Range Status   Specimen Description EXPECTORATED SPUTUM  Final   Special Requests NONE Reflexed from [id number]  Final   Gram Stain   Final    FAIR SPECIMEN - 70-80% WBCS FEW WBC SEEN FEW GRAM POSITIVE COCCI RARE YEAST RARE GRAM VARIABLE ROD    Culture Consistent with normal respiratory flora.  Final   Report Status 02/15/2015 FINAL  Final    Medical History: Past Medical History  Diagnosis Date  . Hypertension   . Diabetes mellitus without complication ([place])   . COPD (chronic obstructive pulmonary disease) ([place])   .  Hypercholesteremia unk  . Coronary artery disease   . Stroke ([place])   . Sleep apnea     does not use CPAP regularly.  . Sarcoidosis ([place])     Medications:  Scheduled:  . antiseptic oral rinse  7 mL Mouth Rinse QID  . aspirin  81 mg Oral Daily  . atorvastatin  40 mg Oral QHS  . budesonide (PULMICORT) nebulizer solution  0.5 mg Nebulization BID  . chlorhexidine gluconate  15 mL Mouth Rinse BID  . enoxaparin (LOVENOX) injection  40 mg Subcutaneous BID  . famotidine  20 mg Per Tube BID  . feeding supplement  (PRO-STAT SUGAR FREE 64)  30 mL Per Tube Daily  . feeding supplement (VITAL HIGH PROTEIN)  1,000 mL Per Tube Q24H  . free water  100 mL Per Tube 3 times per day  . insulin aspart  0-15 Units Subcutaneous 6 times per day  . [START ON 02/16/2015] insulin detemir  16 Units Subcutaneous Q24H  . ipratropium-albuterol  3 mL Nebulization Q4H  . [START ON 02/16/2015] levofloxacin (LEVAQUIN) IV  500 mg Intravenous Q24H  . methylPREDNISolone (SOLU-MEDROL) injection  40 mg Intravenous Q12H  . senna-docusate  2 tablet Oral BID   Infusions:  . fentaNYL infusion INTRAVENOUS 10 mcg/hr (02/15/15 1409)    Assessment: Pharmacy consulted for medication management, levofloxacin, constipation, electrolytes and glucose, for 45 yo male requiring mechanical ventilation. Patient's current sedation regimen is fentanyl drip with prn lorazepam.     Plan:  1. Levofloxacin: Patient is currently on day day 5/7 (first dose 1/6) of levofloxacin. Will continue patient on levofloxacin 500mg  IV Q24hr.   2. Constipation: Patient without documented bowel movement. Will start patient on docusate/senna 2 tabs BID. Will add bisacodyl suppository 10mg  PR x 1.   3. Electrolytes: Patient with hyperkalemia this am. Patient received Kayexalate 30g PO x 1. Repeat potassium in range, will obtain follow-up electrolytes with am labs on 1/11.   4. Glucose: Provider ordered Levemir 16 units Q24hr.    Pharmacy will continue to monitor and adjust per consult.    Simpson,Michael L 02/15/2015,3:39 PM

## 2015-02-15 NOTE — Progress Notes (Signed)
*  PRELIMINARY RESULTS* Echocardiogram 2D Echocardiogram has been performed.  [name] S Stills 02/15/2015, 8:00 PM

## 2015-02-15 NOTE — Progress Notes (Signed)
[facility] Medicine Progess Note    ASSESSMENT/PLAN  45 yo AAM with acute resp failure from acute COPD exacerbation with h/o sarcoidosis, with  pneumonia Patient with failed SAt/SBt yesterday, fio2 increased to 50% today  Assessment and Plan:  Acute exacerbation of COPD with acute hypercapnic respiratory failure/pneumonia -Respiratory Failure -continue Full MV support -continue Bronchodilator Therapy -Wean Fio2 and PEEP as tolerated -continue steroids and ABx as prescribed   Sarcoidosis. -Appears to be stable at this time. We'll continue the patient on steroids.  Continue tube feeds. Continue Pepcid for GI prophylaxis. Continue Lovenox DVT prophylaxis. Patient is adequately sedated with propofol, will continue. Intubated 02/11/15  DVt/GI prx ordered   MICRO DATA: Blood culture again was 07/26/2015 negative. Sputum culture 02/12/2015: Pending      Name: [name] MRN: [id number] DOB: 1970-05-16    ADMISSION DATE:  02/11/2015     SUBJECTIVE:  Remains intubated,sedated Failed SAT/SBT yesterday fio2 at 50%, prolonged exp phase with air trapping on vent graphics-will not attempt wean today  Review of Systems:  UNobtainable to critical illness   VITAL SIGNS: Temp:  [97.5 F (36.4 C)-98.5 F (36.9 C)] 97.7 F (36.5 C) (01/10 0800) Pulse Rate:  [75-103] 80 (01/10 0700) Resp:  [9-22] 14 (01/10 0700) BP: (104-145)/(65-92) 109/73 mmHg (01/10 0700) SpO2:  [88 %-99 %] 92 % (01/10 0746) FiO2 (%):  [30 %-60 %] 50 % (01/10 0800) Weight:  [295 lb 3.1 oz (133.9 kg)-297 lb 9.9 oz (135 kg)] 295 lb 3.1 oz (133.9 kg) (01/10 0446) HEMODYNAMICS:   VENTILATOR SETTINGS: Vent Mode:  [-] PRVC FiO2 (%):  [30 %-60 %] 50 % Set Rate:  [14 bmp] 14 bmp Vt Set:  [450 mL-600 mL] 450 mL PEEP:  [2 cmH20-10 cmH20] 2 cmH20 Pressure Support:  [8 cmH20-15 cmH20] 15 cmH20 Plateau Pressure:  [16 cmH20] 16 cmH20 INTAKE / OUTPUT:  Intake/Output Summary (Last 24  hours) at 02/15/15 0821 Last data filed at 02/15/15 0600  Gross per 24 hour  Intake 1396.38 ml  Output   1325 ml  Net  71.38 ml    PHYSICAL EXAMINATION: Physical Examination:   VS: BP 109/73 mmHg  Pulse 80  Temp(Src) 97.7 F (36.5 C) (Oral)  Resp 14  Ht 6' (1.829 m)  Wt 295 lb 3.1 oz (133.9 kg)  BMI 40.03 kg/m2  SpO2 92%  General Appearance: No distress  Neuro:without focal findings, GCS<8T HEENT: PERRLA, EOM intact. Pulmonary: normal breath sounds   CardiovascularNormal S1,S2.  No m/r/g.   Abdomen: Benign, Soft, non-tender. Skin:   warm, no rashes, no ecchymosis  Extremities: normal, no cyanosis, clubbing.   LABS:   LABORATORY PANEL:   CBC  Recent Labs Lab 02/15/15 0604  WBC 19.8*  HGB 17.9  HCT 58.6*  PLT 205    Chemistries   Recent Labs Lab 02/15/15 0604  NA 144  K 5.6*  CL 100*  CO2 37*  GLUCOSE 160*  BUN 51*  CREATININE 1.52*  CALCIUM 8.0*     Recent Labs Lab 02/14/15 1153 02/14/15 1612 02/14/15 1947 02/15/15 0010 02/15/15 0357 02/15/15 0712  GLUCAP 154* 145* 168* 149* 148* 168*    Recent Labs Lab 02/11/15 1705 02/13/15 0342 02/14/15 0500  PHART 7.27* 7.46* 7.35  PCO2ART 81* 49* 64*  PO2ART 87 110* 67*   No results for input(s): AST, ALT, ALKPHOS, BILITOT, ALBUMIN in the last 168 hours.  Cardiac Enzymes  Recent Labs Lab 02/11/15 0638  TROPONINI <0.03    RADIOLOGY:  Dg Chest 1 View  02/14/2015  CLINICAL DATA:  Acute respiratory failure, exacerbation of COPD, history of chronic respiratory failure, sarcoidosis, diabetes, current smoker. EXAM: CHEST 1 VIEW COMPARISON:  Portable chest x-ray of February 13, 2015 FINDINGS: The lungs are borderline hypoinflated. The interstitial markings are coarse diffusely on the left and at the right lung base. This pattern is not greatly changed. The cardiac silhouette remains enlarged. The central pulmonary vascularity is prominent and there is mild cephalization of the vascular pattern.  The endotracheal tube tip projects 5.1 cm above the crotch of the carina. The esophagogastric tube projects below the inferior margin of the image. IMPRESSION: Slightly decreased inflation today. Persistent right basilar atelectasis or pneumonia. Mild interstitial prominence on the left slightly more conspicuous today. Overall there has not been dramatic interval change in the appearance of the chest. Electronically Signed   By: David  Martinique M.D.   On: 02/14/2015 07:42    I have personally obtained a history, examined the patient, evaluated Pertinent laboratory and [employer]/imaging results, and  formulated the assessment and plan   The Patient requires high complexity decision making for assessment and support, frequent evaluation and titration of therapies, application of advanced monitoring technologies and extensive interpretation of multiple databases. Critical Care Time devoted to patient care services described in this note is 35 minutes.   Overall, patient is critically ill, prognosis is guarded.     Corrin Parker, M.D.  [facility]  Medical Director [place] Director [facility] Cardio-Pulmonary Department

## 2015-02-15 NOTE — Progress Notes (Signed)
[facility] [place] NAME: [name]    MR#:  [id number]  DATE OF BIRTH:  25-Jan-1971  SUBJECTIVE:  CHIEF COMPLAINT:   Chief Complaint  Patient presents with  . Shortness of Breath   patient is 44 year old African-American male with history of hypertension, COPD, diabetes, coronary artery disease, hyperlipidemia who presented to the hospital with complaints of cough, shortness of breath worsening over a period of time and not improving with antibiotic therapy. Patient is admitted to the hospital and eventually intubated due to worsening shortness of breath. He remains on mechanical ventilation. Review of system is unavailable. Failed weaning attempt . FiO2 was advanced today. CT scan of the chest is being planned without contrast. Remains wheezy,  tight on clinical exam    . Review of Systems  Unable to perform ROS: intubated    VITAL SIGNS: Blood pressure 86/56, pulse 74, temperature 97.7 F (36.5 C), temperature source Oral, resp. rate 14, height 6' (1.829 m), weight 133.9 kg (295 lb 3.1 oz), SpO2 94 %.  PHYSICAL EXAMINATION:   GENERAL:  45 y.o.-year-old patient lying in the bed with no acute distress. On mechanical ventilation ET tube was pulled back by about an inch due to diminished left-sided breath sounds, although chest x-ray was unremarkable EYES: Pupils equal, round, reactive to light and accommodation. No scleral icterus. Extraocular muscles intact.  HEENT: Head atraumatic, normocephalic. Oropharynx and nasopharynx clear.  NECK:  Supple, no jugular venous distention. No thyroid enlargement, no tenderness.  LUNGS: Diminished breath sounds bilaterally, bilateral scattered rhonchi , no crepitations, wheezing, some improved with better air entrance on the left, however, significantly diminished on the right. No use of accessory muscles of respiration.  CARDIOVASCULAR: S1, S2 normal. No murmurs, rubs, or gallops.  ABDOMEN:  Soft, nontender, nondistended. Bowel sounds present. No organomegaly or mass.  EXTREMITIES: No pedal edema, cyanosis, or clubbing.  NEUROLOGIC: Cranial nerves II through XII are intact. Muscle strength 5/5 in all extremities. Sensation intact. Gait not checked.  PSYCHIATRIC: The patient is sedated  SKIN: No obvious rash, lesion, or ulcer.   ORDERS/RESULTS REVIEWED:   CBC  Recent Labs Lab 02/11/15 [id number] 02/12/15 0458 02/13/15 0402 02/14/15 0357 02/15/15 0604  WBC 5.9 7.8 13.5* 18.4* 19.8*  HGB 17.5 15.9 15.9 17.3 17.9  HCT 54.5* 50.0 49.7 55.1* 58.6*  PLT 172 169 167 187 205  MCV 85.9 85.0 82.1 84.2 85.7  MCH 27.5 27.0 26.2 26.4 26.1  MCHC 32.1 31.8* 31.9* 31.4* 30.5*  RDW 15.1* 14.8* 15.4* 15.6* 15.8*   ------------------------------------------------------------------------------------------------------------------  Chemistries   Recent Labs Lab 02/11/15 0638 02/12/15 0458 02/13/15 0402 02/14/15 0357 02/15/15 0604  NA 143 141 143 143 144  K 4.4 3.8 4.0 4.6 5.6*  CL 101 100* 101 101 100*  CO2 41* 30 34* 37* 37*  GLUCOSE 137* 162* 167* 215* 160*  BUN 16 22* 28* 33* 51*  CREATININE 1.29* 1.64* 1.36* 1.16 1.52*  CALCIUM 8.8* 8.7* 8.6* 8.5* 8.0*   ------------------------------------------------------------------------------------------------------------------ estimated creatinine clearance is 87.8 mL/min (by C-G formula based on Cr of 1.52). ------------------------------------------------------------------------------------------------------------------ No results for input(s): TSH, T4TOTAL, T3FREE, THYROIDAB in the last 72 hours.  Invalid input(s): FREET3  Cardiac Enzymes  Recent Labs Lab 02/09/15 0734 02/11/15 0638  TROPONINI <0.03 <0.03   ------------------------------------------------------------------------------------------------------------------ Invalid input(s):  POCBNP ---------------------------------------------------------------------------------------------------------------  RADIOLOGY: Dg Chest 1 View  02/14/2015  CLINICAL DATA:  Acute respiratory failure, exacerbation of COPD, history of chronic respiratory failure, sarcoidosis, diabetes, current smoker.  EXAM: CHEST 1 VIEW COMPARISON:  Portable chest x-ray of February 13, 2015 FINDINGS: The lungs are borderline hypoinflated. The interstitial markings are coarse diffusely on the left and at the right lung base. This pattern is not greatly changed. The cardiac silhouette remains enlarged. The central pulmonary vascularity is prominent and there is mild cephalization of the vascular pattern. The endotracheal tube tip projects 5.1 cm above the crotch of the carina. The esophagogastric tube projects below the inferior margin of the image. IMPRESSION: Slightly decreased inflation today. Persistent right basilar atelectasis or pneumonia. Mild interstitial prominence on the left slightly more conspicuous today. Overall there has not been dramatic interval change in the appearance of the chest. Electronically Signed   By: David  Martinique M.D.   On: 02/14/2015 07:42    EKG:  Orders placed or performed during the hospital encounter of 02/11/15  . ED EKG  . ED EKG  . EKG 12-Lead  . EKG 12-Lead    ASSESSMENT AND PLAN:  Active Problems:   Acute respiratory failure with hypoxia and hypercarbia (HCC)   COPD exacerbation (HCC)   Dyspnea  #1. Acute on chronic respiratory failure with hypoxia and hypercapnia, status post intubation and mechanical ventilation, continue patient on duo nebs, Pulmicort, levofloxacin as well as Zithromax, steroid injections,  appreciate pulmonologist input, failed weaning attempt. Getting CT of the chest without contrast. May need that to have a tracheostomy done and the slow weaning, per pulmonologist #2. Acute bronchitis, continue antibiotic therapy. sputum cultures showed normal flora,  MRSA screen by PCR was negative, blood cultures were negative  #3. COPD exacerbation, as above. Continue therapy. Minimal improvement clinically #4. Diabetes mellitus type 2. Continue patient on Levemir and NovoLog, sliding scale insulin. Blood glucose levels are ranging between 160s to 200s, advance insulin Levemir to 16 units #5 . Acute on chronic renal failure , superimposed on renal insufficiency, chronic , stage II , hold off IV fluids at this time yet , follow patient's creatinine closely, off Lasix, not on IV fluids due to peripheral edema   Management plans discussed with the patient, family and they are in agreement.   DRUG ALLERGIES:  Allergies  Allergen Reactions  . Penicillins Anaphylaxis and Other (See Comments)    Unable to obtain enough information to answer additional questions about this medication.      CODE STATUS:     Code Status Orders        Start     Ordered   02/11/15 1417  Full code   Continuous     02/11/15 1417      TOTAL CRITICAL CARE TIME TAKING CARE OF THIS PATIENT: 40 minutes.    Theodoro Grist M.D on 02/15/2015 at 1:58 PM  Between 7am to 6pm - Pager - [phone]  After 6pm go to [email or web address] - password [email or web address] [place]  Office  [phone]  CC: Primary care physician; [facility]

## 2015-02-16 LAB — GLUCOSE, CAPILLARY
GLUCOSE-CAPILLARY: 176 mg/dL — AB (ref 65–99)
GLUCOSE-CAPILLARY: 187 mg/dL — AB (ref 65–99)
GLUCOSE-CAPILLARY: 205 mg/dL — AB (ref 65–99)
Glucose-Capillary: 181 mg/dL — ABNORMAL HIGH (ref 65–99)
Glucose-Capillary: 185 mg/dL — ABNORMAL HIGH (ref 65–99)
Glucose-Capillary: 194 mg/dL — ABNORMAL HIGH (ref 65–99)
Glucose-Capillary: 214 mg/dL — ABNORMAL HIGH (ref 65–99)

## 2015-02-16 LAB — BASIC METABOLIC PANEL
Anion gap: 4 — ABNORMAL LOW (ref 5–15)
BUN: 60 mg/dL — ABNORMAL HIGH (ref 6–20)
CALCIUM: 7.9 mg/dL — AB (ref 8.9–10.3)
CO2: 37 mmol/L — AB (ref 22–32)
CREATININE: 1.29 mg/dL — AB (ref 0.61–1.24)
Chloride: 106 mmol/L (ref 101–111)
GFR calc non Af Amer: >60 mL/min (ref >60)
Glucose, Bld: 197 mg/dL — ABNORMAL HIGH (ref 65–99)
Potassium: 4.7 mmol/L (ref 3.5–5.1)
SODIUM: 147 mmol/L — AB (ref 135–145)

## 2015-02-16 LAB — CBC
HCT: 51.9 % (ref 40.0–52.0)
Hemoglobin: 16 g/dL (ref 13.0–18.0)
MCH: 26.3 pg (ref 26.0–34.0)
MCHC: 30.8 g/dL — ABNORMAL LOW (ref 32.0–36.0)
MCV: 85.4 fL (ref 80.0–100.0)
PLATELETS: 128 10*3/uL — AB (ref 150–440)
RBC: 6.08 MIL/uL — AB (ref 4.40–5.90)
RDW: 15.6 % — ABNORMAL HIGH (ref 11.5–14.5)
WBC: 8.6 10*3/uL (ref 3.8–10.6)

## 2015-02-16 MED ORDER — FUROSEMIDE 10 MG/ML IJ SOLN
20.0000 mg | Freq: Every day | INTRAMUSCULAR | Status: DC
  Administered 2015-02-16 – 2015-02-18 (×3): 20 mg via INTRAVENOUS
  Filled 2015-02-16 (×3): qty 2

## 2015-02-16 MED ORDER — SENNOSIDES-DOCUSATE SODIUM 8.6-50 MG PO TABS
1.0000 | ORAL_TABLET | Freq: Every day | ORAL | Status: DC
  Administered 2015-02-17 – 2015-02-18 (×2): 1 via ORAL
  Filled 2015-02-16 (×2): qty 1

## 2015-02-16 MED ORDER — ANTISEPTIC ORAL RINSE SOLUTION (CORINZ)
7.0000 mL | OROMUCOSAL | Status: DC
  Administered 2015-02-16 – 2015-02-17 (×7): 7 mL via OROMUCOSAL
  Filled 2015-02-16 (×20): qty 7

## 2015-02-16 MED ORDER — INSULIN DETEMIR 100 UNIT/ML ~~LOC~~ SOLN
21.0000 [IU] | Freq: Every day | SUBCUTANEOUS | Status: DC
  Filled 2015-02-16: qty 0.21

## 2015-02-16 MED ORDER — INSULIN DETEMIR 100 UNIT/ML ~~LOC~~ SOLN
5.0000 [IU] | Freq: Once | SUBCUTANEOUS | Status: AC
  Administered 2015-02-16: 5 [IU] via SUBCUTANEOUS
  Filled 2015-02-16 (×2): qty 0.05

## 2015-02-16 NOTE — Progress Notes (Signed)
[facility] Progess Note    ASSESSMENT/PLAN  45 yo AAM with acute resp failure from acute COPD exacerbation with h/o sarcoidosis, with  pneumonia Plan for SAT/SBT today, fio2 decreased to 35% this AM  Ct chest 1/10 images reviewed 02/16/2015  Assessment and Plan:  Acute exacerbation of COPD with acute hypercapnic respiratory failure/pneumonia -Respiratory Failure -continue Full MV support -continue Bronchodilator Therapy -Wean Fio2 and PEEP as tolerated -continue steroids and ABx as prescribed   Sarcoidosis. -Appears to be stable at this time. We'll continue the patient on steroids.  Continue tube feeds. Continue Pepcid for GI prophylaxis. Continue Lovenox DVT prophylaxis. Patient is adequately sedated with propofol, will continue. Intubated 02/11/15  DVt/GI prx ordered   MICRO DATA: Blood culture again was 07/26/2015 negative. Sputum culture 02/12/2015: Pending      Name: [name] MRN: [id number] DOB: 11/30/1970    ADMISSION DATE:  02/11/2015     SUBJECTIVE:  Remains intubated,sedated Failed SAT/SBT yesterday fio2 at 50%, prolonged exp phase with air trapping on vent graphics-will not attempt wean today  Review of Systems:  UNobtainable to critical illness   VITAL SIGNS: Temp:  [97.6 F (36.4 C)-98.8 F (37.1 C)] 97.6 F (36.4 C) (01/11 0000) Pulse Rate:  [63-98] 98 (01/11 0700) Resp:  [10-23] 23 (01/11 0700) BP: (76-132)/(48-79) 132/79 mmHg (01/11 0700) SpO2:  [94 %-98 %] 98 % (01/11 0700) FiO2 (%):  [50 %] 50 % (01/11 0700) Weight:  [292 lb 1.8 oz (132.5 kg)] 292 lb 1.8 oz (132.5 kg) (01/11 0500) HEMODYNAMICS:   VENTILATOR SETTINGS: Vent Mode:  [-] PRVC FiO2 (%):  [50 %] 50 % Set Rate:  [14 bmp] 14 bmp Vt Set:  [600 mL] 600 mL PEEP:  [2 cmH20] 2 cmH20 Plateau Pressure:  [18 cmH20-24 cmH20] 18 cmH20 INTAKE / OUTPUT:  Intake/Output Summary (Last 24 hours) at 02/16/15 0806 Last data filed at 02/16/15 0700  Gross per 24 hour  Intake 1672.5 ml  Output   2425 ml  Net -752.5 ml    PHYSICAL EXAMINATION: Physical Examination:   VS: BP 132/79 mmHg  Pulse 98  Temp(Src) 97.6 F (36.4 C) (Axillary)  Resp 23  Ht 6' (1.829 m)  Wt 292 lb 1.8 oz (132.5 kg)  BMI 39.61 kg/m2  SpO2 98%  General Appearance: No distress  Neuro:without focal findings, GCS<8T HEENT: PERRLA, EOM intact. Pulmonary: normal breath sounds   CardiovascularNormal S1,S2.  No m/r/g.   Abdomen: Benign, Soft, non-tender. Skin:   warm, no rashes, no ecchymosis  Extremities: normal, no cyanosis, clubbing.   LABS:   LABORATORY PANEL:   CBC  Recent Labs Lab 02/16/15 0451  WBC 8.6  HGB 16.0  HCT 51.9  PLT 128*    Chemistries   Recent Labs Lab 02/16/15 0451  NA 147*  K 4.7  CL 106  CO2 37*  GLUCOSE 197*  BUN 60*  CREATININE 1.29*  CALCIUM 7.9*     Recent Labs Lab 02/15/15 1108 02/15/15 1559 02/15/15 1907 02/15/15 2341 02/16/15 0414 02/16/15 0735  GLUCAP 186* 239* 262* 205* 181* 194*    Recent Labs Lab 02/13/15 0342 02/14/15 0500 02/15/15 0845  PHART 7.46* 7.35 7.11*  PCO2ART 49* 64* 130*  PO2ART 110* 67* 88   No results for input(s): AST, ALT, ALKPHOS, BILITOT, ALBUMIN in the last 168 hours.  Cardiac Enzymes  Recent Labs Lab 02/11/15 0638  TROPONINI <0.03    RADIOLOGY:  Ct Chest Wo Contrast  02/15/2015  CLINICAL DATA:  Cough,  shortness of breath, intubated EXAM: CT CHEST WITHOUT CONTRAST TECHNIQUE: Multidetector CT imaging of the chest was performed following the standard protocol without IV contrast. COMPARISON:  Chest radiograph dated 02/15/2015. CT chest dated 08/15/2014. FINDINGS: Mediastinum/Nodes: Mild cardiomegaly.  No pericardial effusion. Small mediastinal lymph nodes which do not meet pathologic CT size criteria. Right arm PICC terminates at the cavoatrial junction. Visualized thyroid is unremarkable. Lungs/Pleura: Endotracheal tube terminates 5.5 cm above the carina.  Evaluation of the lung parenchyma is constrained by respiratory motion. Scattered areas of scarring with architectural distortion. Right lower lobe volume loss with bronchiectasis. Superimposed mild patchy bilateral lower lobe opacities, likely atelectasis. Pneumonia is considered unlikely. Underlying mild to moderate centrilobular and paraseptal emphysematous changes. Small bilateral pleural effusions.  No pneumothorax. Upper abdomen: Visualized upper abdomen is notable for multiple gallstones measuring up to 3.0 cm (series 2/ image 52) and an enteric tube terminating in the gastric body. Musculoskeletal: Mild degenerative changes of the visualized thoracolumbar spine. IMPRESSION: Endotracheal tube terminates 5.5 cm above the carina. Small bilateral pleural effusions. Associated mild patchy bilateral lower lobe opacities, likely atelectasis. Pneumonia is considered unlikely. Underlying chronic scarring with architectural distortion bilaterally with mild to moderate emphysematous changes. Electronically Signed   By: Julian Hy M.D.   On: 02/15/2015 16:48   Dg Chest Port 1 View  02/15/2015  CLINICAL DATA:  Evaluation of PICC line placement. EXAM: PORTABLE CHEST 1 VIEW COMPARISON:  02/14/2015 FINDINGS: There has been a right subclavian approach PICC line placement with tip at the expected location of cavoatrial junction. Endotracheal tube is unchanged in positioning, terminating approximately 5.5 cm superior to the carina. Enteric catheter transverses the thorax. The cardiac silhouette is enlarged. Mediastinal contours appear intact. There are bilateral scattered patchy airspace and alveolar infiltrates, slightly more prominent than on several previous radiographs. There may be a small left pleural effusion. There is no evidence of pneumothorax. Osseous structures are without acute abnormality. Soft tissues are grossly normal. IMPRESSION: Status post right subclavian approach PICC line placement. No evidence  of pneumothorax. Slight increase in bilateral patchy airspace and alveolar opacities. Probable small left pleural effusion. Electronically Signed   By: Fidela Salisbury M.D.   On: 02/15/2015 15:54    I have personally obtained a history, examined the patient, evaluated Pertinent laboratory and RadioGraphic/imaging results, and  formulated the assessment and plan   The Patient requires high complexity decision making for assessment and support, frequent evaluation and titration of therapies, application of advanced monitoring technologies and extensive interpretation of multiple databases. Critical Care Time devoted to patient care services described in this note is 35 minutes.   Overall, patient is critically ill, prognosis is guarded.     Corrin Parker, M.D.  [facility]  Medical Director [place] Director [facility] Cardio-Pulmonary Department

## 2015-02-16 NOTE — Progress Notes (Signed)
[facility] [place] NAME: [name]    MR#:  [id number]  DATE OF BIRTH:  [birth year]  SUBJECTIVE:  CHIEF COMPLAINT:   Chief Complaint  Patient presents with  . Shortness of Breath   patient is 46 year old African-American male with history of hypertension, COPD, diabetes, coronary artery disease, hyperlipidemia who presented to the hospital with complaints of cough, shortness of breath worsening over a period of time and not improving with antibiotic therapy. Patient is admitted to the hospital and eventually intubated due to worsening shortness of breath. He remains on mechanical ventilation. Review of system is unavailable. Failed weaning attempt . Remains stable on the vent. CT scan of the chest revealed bilateral pleural effusions, atelectasis, moderate emphysema. Patient is on pressure support now and is able to tolerate it for the past 2 or 3 hours.     . Review of Systems  Unable to perform ROS: intubated    VITAL SIGNS: Blood pressure 113/73, pulse 99, temperature 99 F (37.2 C), temperature source Axillary, resp. rate 14, height 6' (1.829 m), weight 132.5 kg (292 lb 1.8 oz), SpO2 94 %.  PHYSICAL EXAMINATION:   GENERAL:  45 y.o.-year-old patient lying in the bed with no acute distress. On mechanical ventilation EYES: Pupils equal, round, reactive to light and accommodation. No scleral icterus. Extraocular muscles intact.  HEENT: Head atraumatic, normocephalic. Oropharynx and nasopharynx clear.  NECK:  Supple, no jugular venous distention. No thyroid enlargement, no tenderness.  LUNGS: Some better breath sounds bilaterally, bilateral scattered rhonchi , no crepitations, no wheezing. No use of accessory muscles of respiration.  CARDIOVASCULAR: S1, S2 normal. No murmurs, rubs, or gallops.  ABDOMEN: Soft, nontender, nondistended. Bowel sounds present. No organomegaly or mass.  EXTREMITIES: No pedal edema, cyanosis, or clubbing.   NEUROLOGIC: Cranial nerves II through XII are intact. Muscle strength 5/5 in all extremities. Sensation intact. Gait not checked.  PSYCHIATRIC: The patient is sedated , but interactive  when sedation is weaned off per nursing staff and patient's wife SKIN: No obvious rash, lesion, or ulcer.   ORDERS/RESULTS REVIEWED:   CBC  Recent Labs Lab 02/12/15 0458 02/13/15 0402 02/14/15 0357 02/15/15 0604 02/16/15 0451  WBC 7.8 13.5* 18.4* 19.8* 8.6  HGB 15.9 15.9 17.3 17.9 16.0  HCT 50.0 49.7 55.1* 58.6* 51.9  PLT 169 167 187 205 128*  MCV 85.0 82.1 84.2 85.7 85.4  MCH 27.0 26.2 26.4 26.1 26.3  MCHC 31.8* 31.9* 31.4* 30.5* 30.8*  RDW 14.8* 15.4* 15.6* 15.8* 15.6*   ------------------------------------------------------------------------------------------------------------------  Chemistries   Recent Labs Lab 02/12/15 0458 02/13/15 0402 02/14/15 0357 02/15/15 0604 02/15/15 1439 02/16/15 0451  NA 141 143 143 144  --  147*  K 3.8 4.0 4.6 5.6* 4.9 4.7  CL 100* 101 101 100*  --  106  CO2 30 34* 37* 37*  --  37*  GLUCOSE 162* 167* 215* 160*  --  197*  BUN 22* 28* 33* 51*  --  60*  CREATININE 1.64* 1.36* 1.16 1.52*  --  1.29*  CALCIUM 8.7* 8.6* 8.5* 8.0*  --  7.9*   ------------------------------------------------------------------------------------------------------------------ estimated creatinine clearance is 102.9 mL/min (by C-G formula based on Cr of 1.29). ------------------------------------------------------------------------------------------------------------------ No results for input(s): TSH, T4TOTAL, T3FREE, THYROIDAB in the last 72 hours.  Invalid input(s): FREET3  Cardiac Enzymes  Recent Labs Lab 02/11/15 0638  TROPONINI <0.03   ------------------------------------------------------------------------------------------------------------------ Invalid input(s):  POCBNP ---------------------------------------------------------------------------------------------------------------  RADIOLOGY: Ct Chest Wo Contrast  02/15/2015  CLINICAL DATA:  Cough, shortness of breath, intubated EXAM: CT CHEST WITHOUT CONTRAST TECHNIQUE: Multidetector CT imaging of the chest was performed following the standard protocol without IV contrast. COMPARISON:  Chest radiograph dated 02/15/2015. CT chest dated 08/15/2014. FINDINGS: Mediastinum/Nodes: Mild cardiomegaly.  No pericardial effusion. Small mediastinal lymph nodes which do not meet pathologic CT size criteria. Right arm PICC terminates at the cavoatrial junction. Visualized thyroid is unremarkable. Lungs/Pleura: Endotracheal tube terminates 5.5 cm above the carina. Evaluation of the lung parenchyma is constrained by respiratory motion. Scattered areas of scarring with architectural distortion. Right lower lobe volume loss with bronchiectasis. Superimposed mild patchy bilateral lower lobe opacities, likely atelectasis. Pneumonia is considered unlikely. Underlying mild to moderate centrilobular and paraseptal emphysematous changes. Small bilateral pleural effusions.  No pneumothorax. Upper abdomen: Visualized upper abdomen is notable for multiple gallstones measuring up to 3.0 cm (series 2/ image 52) and an enteric tube terminating in the gastric body. Musculoskeletal: Mild degenerative changes of the visualized thoracolumbar spine. IMPRESSION: Endotracheal tube terminates 5.5 cm above the carina. Small bilateral pleural effusions. Associated mild patchy bilateral lower lobe opacities, likely atelectasis. Pneumonia is considered unlikely. Underlying chronic scarring with architectural distortion bilaterally with mild to moderate emphysematous changes. Electronically Signed   By: Julian Hy M.D.   On: 02/15/2015 16:48   Dg Chest Port 1 View  02/15/2015  CLINICAL DATA:  Evaluation of PICC line placement. EXAM: PORTABLE CHEST 1  VIEW COMPARISON:  02/14/2015 FINDINGS: There has been a right subclavian approach PICC line placement with tip at the expected location of cavoatrial junction. Endotracheal tube is unchanged in positioning, terminating approximately 5.5 cm superior to the carina. Enteric catheter transverses the thorax. The cardiac silhouette is enlarged. Mediastinal contours appear intact. There are bilateral scattered patchy airspace and alveolar infiltrates, slightly more prominent than on several previous radiographs. There may be a small left pleural effusion. There is no evidence of pneumothorax. Osseous structures are without acute abnormality. Soft tissues are grossly normal. IMPRESSION: Status post right subclavian approach PICC line placement. No evidence of pneumothorax. Slight increase in bilateral patchy airspace and alveolar opacities. Probable small left pleural effusion. Electronically Signed   By: Fidela Salisbury M.D.   On: 02/15/2015 15:54    EKG:  Orders placed or performed during the hospital encounter of 02/11/15  . ED EKG  . ED EKG  . EKG 12-Lead  . EKG 12-Lead    ASSESSMENT AND PLAN:  Active Problems:   Acute respiratory failure with hypoxia and hypercarbia (HCC)   COPD exacerbation (HCC)   Dyspnea  #1. Acute on chronic respiratory failure with hypoxia and hypercapnia due to COPD exacerbation, status post intubation January 6 and mechanical ventilation, continue patient on duo nebs, Pulmicort, levofloxacin, steroid injections,  appreciate pulmonologist input, failed weaning attempt a day ago. Initiating Lasix intravenously once a day since CT of the chest without contrast revealed bilateral pleural effusions. Weaning off the vent by the pulmonary  #2. Acute bronchitis, continue antibiotic therapy. sputum cultures showed normal flora, MRSA screen by PCR was negative, blood cultures were negative  #3. COPD exacerbation, as above. Continue therapy. Some improvement clinically #4. Diabetes  mellitus type 2. Continue patient on Levemir and NovoLog, sliding scale insulin. Blood glucose levels are ranging between 160s to 200s, advance insulin Levemir to 21 units, taper off on steroids are being weaned off #5 . Acute on chronic renal failure , superimposed on renal insufficiency, chronic , stage II -III , initiate low dose of Lasix. Follow  urinary output as well as creatinine closely  Management plans discussed with the patient, family and they are in agreement.   DRUG ALLERGIES:  Allergies  Allergen Reactions  . Penicillins Anaphylaxis and Other (See Comments)    Unable to obtain enough information to answer additional questions about this medication.      CODE STATUS:     Code Status Orders        Start     Ordered   02/11/15 1417  Full code   Continuous     02/11/15 1417      TOTAL CRITICAL CARE TIME TAKING CARE OF THIS PATIENT: 40 minutes.    Theodoro Grist M.D on 02/16/2015 at 3:34 PM  Between 7am to 6pm - Pager - [phone]  After 6pm go to [email or web address] - password [email or web address] [facility] [facility]  Office  [phone]  CC: Primary care physician; [facility]

## 2015-02-16 NOTE — Progress Notes (Signed)
Nutrition Follow-up    INTERVENTION:   EN: recommend continuing TF at current goal rate of 60 ml/hr as tolerated; continue to assess   NUTRITION DIAGNOSIS:   Inadequate oral intake related to acute illness as evidenced by NPO status. Being addressed via TF  GOAL:   Provide needs based on ASPEN/SCCM guidelines  MONITOR:    (Energy Intake, Anthropometrics, Digestive System, Electrolyte/Renal Profile, Glucose Profile)  REASON FOR ASSESSMENT:   Ventilator    ASSESSMENT:   Pt remains on vent  Diet Order:  Diet NPO time specified   EN: tolerating Vital High Protein at rate of 60 ml/hr  Digestive System: abdomen soft, obese; BS hypoactive; +loose BM 1/11  Last BM:   1/11  Electrolyte and Renal Profile:  Recent Labs Lab 02/14/15 0357 02/15/15 0604 02/15/15 1439 02/16/15 0451  BUN 33* 51*  --  60*  CREATININE 1.16 1.52*  --  1.29*  NA 143 144  --  147*  K 4.6 5.6* 4.9 4.7   Glucose Profile:  Recent Labs  02/16/15 0414 02/16/15 0735 02/16/15 1205  GLUCAP 181* 194* 214*   Meds: ss novolog, levemir, solumedrol  Height:   Ht Readings from Last 1 Encounters:  02/11/15 6' (1.829 m)    Weight:   Wt Readings from Last 1 Encounters:  02/16/15 292 lb 1.8 oz (132.5 kg)    BMI:  Body mass index is 39.61 kg/(m^2).  Estimated Nutritional Needs:   Kcal:  [id number] (11-14 kcals/kg) using wt of 133.8  Protein:  122-162 g (1.5-2.0 g/kg) using IBW 81 kg  Fluid:  2025-2430 mL (25-30 ml/kg)   HIGH Care Level  Kerman Passey MS, RD, LDN [phone] Pager  [phone] Weekend/On-Call Pager

## 2015-02-16 NOTE — Progress Notes (Signed)
ANTIBIOTIC CONSULT NOTE - FOLLOW UP   Pharmacy Consult for ICU medication management Indication: rule out pneumonia  Allergies  Allergen Reactions  . Penicillins Anaphylaxis and Other (See Comments)    Unable to obtain enough information to answer additional questions about this medication.      Patient Measurements: Height: 6' (182.9 cm) Weight: 292 lb 1.8 oz (132.5 kg) IBW/kg (Calculated) : 77.6  Vital Signs: Temp: 98.9 F (37.2 C) (01/11 1700) Temp Source: Axillary (01/11 1700) BP: 120/77 mmHg (01/11 1800) Pulse Rate: 103 (01/11 1800) Intake/Output from previous day: 01/10 0701 - 01/11 0700 In: 1837.5 [I.V.:237.5; NG/GT:1500; IV Piggyback:100] Out: 2650 [Urine:2650] Intake/Output from this shift:    Labs:  Recent Labs  02/14/15 0357 02/15/15 0604 02/16/15 0451  WBC 18.4* 19.8* 8.6  HGB 17.3 17.9 16.0  PLT 187 205 128*  CREATININE 1.16 1.52* 1.29*   Estimated Creatinine Clearance: 102.9 mL/min (by C-G formula based on Cr of 1.29). No results for input(s): VANCOTROUGH, VANCOPEAK, VANCORANDOM, GENTTROUGH, GENTPEAK, GENTRANDOM, TOBRATROUGH, TOBRAPEAK, TOBRARND, AMIKACINPEAK, AMIKACINTROU, AMIKACIN in the last 72 hours.   Microbiology: Recent Results (from the past 720 hour(s))  Culture, blood (routine x 2)     Status: None (Preliminary result)   Collection Time: 02/11/15  6:38 AM  Result Value Ref Range Status   Specimen Description BLOOD RIGHT HAND  Final   Special Requests BOTTLES DRAWN AEROBIC AND ANAEROBIC  1CC  Final   Culture NO GROWTH 4 DAYS  Final   Report Status PENDING  Incomplete  Culture, blood (routine x 2)     Status: None (Preliminary result)   Collection Time: 02/11/15  8:53 AM  Result Value Ref Range Status   Specimen Description BLOOD LEFT HAND  Final   Special Requests BOTTLES DRAWN AEROBIC AND ANAEROBIC  2CC  Final   Culture NO GROWTH 4 DAYS  Final   Report Status PENDING  Incomplete  MRSA PCR Screening     Status: None   Collection  Time: 02/11/15  2:00 PM  Result Value Ref Range Status   MRSA by PCR NEGATIVE NEGATIVE Final    Comment:        The GeneXpert MRSA Assay (FDA approved for NASAL specimens only), is one component of a comprehensive MRSA colonization surveillance program. It is not intended to diagnose MRSA infection nor to guide or monitor treatment for MRSA infections.   Culture, expectorated sputum-assessment     Status: None   Collection Time: 02/12/15  4:02 PM  Result Value Ref Range Status   Specimen Description EXPECTORATED SPUTUM  Final   Special Requests NONE  Final   Sputum evaluation THIS SPECIMEN IS ACCEPTABLE FOR SPUTUM CULTURE  Final   Report Status 02/12/2015 FINAL  Final  Culture, respiratory (NON-Expectorated)     Status: None   Collection Time: 02/12/15  4:02 PM  Result Value Ref Range Status   Specimen Description EXPECTORATED SPUTUM  Final   Special Requests NONE Reflexed from [id number]  Final   Gram Stain   Final    FAIR SPECIMEN - 70-80% WBCS FEW WBC SEEN FEW GRAM POSITIVE COCCI RARE YEAST RARE GRAM VARIABLE ROD    Culture Consistent with normal respiratory flora.  Final   Report Status 02/15/2015 FINAL  Final    Medical History: Past Medical History  Diagnosis Date  . Hypertension   . Diabetes mellitus without complication ([place])   . COPD (chronic obstructive pulmonary disease) ([place])   . Hypercholesteremia unk  . Coronary artery disease   .  Stroke ([place])   . Sleep apnea     does not use CPAP regularly.  . Sarcoidosis ([place])     Medications:  Scheduled:  . antiseptic oral rinse  7 mL Mouth Rinse 10 times per day  . aspirin  81 mg Oral Daily  . atorvastatin  40 mg Oral QHS  . budesonide (PULMICORT) nebulizer solution  0.5 mg Nebulization BID  . chlorhexidine gluconate  15 mL Mouth Rinse BID  . enoxaparin (LOVENOX) injection  40 mg Subcutaneous BID  . famotidine  20 mg Per Tube BID  . feeding supplement (PRO-STAT SUGAR FREE 64)  30 mL Per Tube Daily  . feeding  supplement (VITAL HIGH PROTEIN)  1,000 mL Per Tube Q24H  . free water  100 mL Per Tube 3 times per day  . furosemide  20 mg Intravenous Daily  . insulin aspart  0-15 Units Subcutaneous 6 times per day  . [START ON 02/17/2015] insulin detemir  21 Units Subcutaneous QHS  . insulin detemir  5 Units Subcutaneous Once  . ipratropium-albuterol  3 mL Nebulization Q4H  . levofloxacin (LEVAQUIN) IV  500 mg Intravenous Q24H  . methylPREDNISolone (SOLU-MEDROL) injection  40 mg Intravenous Q12H  . [START ON 02/17/2015] senna-docusate  1 tablet Oral QHS  . sodium chloride  10-40 mL Intracatheter Q12H   Infusions:  . fentaNYL infusion INTRAVENOUS 10 mcg/hr (02/16/15 2114)    Assessment: Pharmacy consulted for medication management, levofloxacin, constipation, electrolytes and glucose, for 45 yo male requiring mechanical ventilation. Patient's current sedation regimen is fentanyl drip with prn lorazepam.     Plan:  1. Levofloxacin: Patient is currently on day day 6/7 (first dose 1/6) of levofloxacin. Will continue patient on levofloxacin 500mg  IV Q24hr.   2. Constipation: Bowel movement on 1/11. Will continue patient on docusate/senna 1 tabs BID.   3. Electrolytes: Will obtain follow-up electrolytes with am labs on 1/11.   4. Glucose: Provider ordered Levemir 21 units Q24hr.    Pharmacy will continue to monitor and adjust per consult.    Netanel Yannuzzi L 02/16/2015,9:57 PM

## 2015-02-17 DIAGNOSIS — J189 Pneumonia, unspecified organism: Secondary | ICD-10-CM

## 2015-02-17 LAB — GLUCOSE, CAPILLARY
GLUCOSE-CAPILLARY: 154 mg/dL — AB (ref 65–99)
GLUCOSE-CAPILLARY: 181 mg/dL — AB (ref 65–99)
GLUCOSE-CAPILLARY: 129 mg/dL — AB (ref 65–99)
Glucose-Capillary: 129 mg/dL — ABNORMAL HIGH (ref 65–99)
Glucose-Capillary: 111 mg/dL — ABNORMAL HIGH (ref 65–99)

## 2015-02-17 LAB — BASIC METABOLIC PANEL
ANION GAP: 0 — AB (ref 5–15)
BUN: 45 mg/dL — ABNORMAL HIGH (ref 6–20)
CALCIUM: 8.3 mg/dL — AB (ref 8.9–10.3)
CO2: 42 mmol/L — AB (ref 22–32)
CREATININE: 1.16 mg/dL (ref 0.61–1.24)
Chloride: 108 mmol/L (ref 101–111)
Glucose, Bld: 174 mg/dL — ABNORMAL HIGH (ref 65–99)
Potassium: 5 mmol/L (ref 3.5–5.1)
Sodium: 148 mmol/L — ABNORMAL HIGH (ref 135–145)

## 2015-02-17 LAB — CULTURE, BLOOD (ROUTINE X 2)
Culture: NO GROWTH
Culture: NO GROWTH

## 2015-02-17 LAB — PLATELET COUNT: Platelets: 144 10*3/uL — ABNORMAL LOW (ref 150–440)

## 2015-02-17 MED ORDER — METHYLPREDNISOLONE SODIUM SUCC 40 MG IJ SOLR
40.0000 mg | Freq: Every day | INTRAMUSCULAR | Status: DC
  Administered 2015-02-18: 40 mg via INTRAVENOUS
  Filled 2015-02-17: qty 1

## 2015-02-17 MED ORDER — DEXTROSE 5 % IV SOLN: INTRAVENOUS | Status: DC

## 2015-02-17 MED ORDER — DEXTROSE 5 % IV SOLN
INTRAVENOUS | Status: DC
  Administered 2015-02-17 – 2015-02-18 (×2): via INTRAVENOUS

## 2015-02-17 MED ORDER — FAMOTIDINE IN NACL 20-0.9 MG/50ML-% IV SOLN
20.0000 mg | Freq: Two times a day (BID) | INTRAVENOUS | Status: DC
  Administered 2015-02-17 (×2): 20 mg via INTRAVENOUS
  Filled 2015-02-17 (×4): qty 50

## 2015-02-17 NOTE — Progress Notes (Signed)
RT called to room to remove Bipap and do trial on nasal cannula.  RT removed Bipap and placed patient on 4LPM [place].  Wife at bedside, patient coughed up large amount of brown sputum, RT suctioned with yankuer and placed yankuer into patient's hand.  Patient tolerating Coldiron well at this time, will continue to monitor.

## 2015-02-17 NOTE — Progress Notes (Signed)
eLink Physician-Brief Progress Note Patient Name: [name] DOB: July 31, 1970 MRN: [id number]   Date of Service  02/17/2015  HPI/Events of Note  Patient self extubated, on NRB, saturating well, no acute distress.  Wears NIPPV at home  eICU Interventions  bipap ordered.      Intervention Category Major Interventions: Other:  Dynesha Woolen 02/17/2015, 3:02 AM

## 2015-02-17 NOTE — Progress Notes (Signed)
ANTIBIOTIC CONSULT NOTE - FOLLOW UP   Pharmacy Consult for ICU medication management Indication: rule out pneumonia  Allergies  Allergen Reactions  . Penicillins Anaphylaxis and Other (See Comments)    Unable to obtain enough information to answer additional questions about this medication.      Patient Measurements: Height: 6' (182.9 cm) Weight: 291 lb 10.7 oz (132.3 kg) IBW/kg (Calculated) : 77.6  Vital Signs: Temp: 99.4 F (37.4 C) (01/12 0800) Temp Source: Axillary (01/12 0800) BP: 130/88 mmHg (01/12 1100) Pulse Rate: 91 (01/12 1100) Intake/Output from previous day: 01/11 0701 - 01/12 0700 In: 1531.3 [I.V.:141.3; NG/GT:1290; IV Piggyback:100] Out: [id number] [Urine:4375; Emesis/NG output:800] Intake/Output from this shift: Total I/O In: -  Out: 800 [Urine:400; Emesis/NG output:400]  Labs:  Recent Labs  02/15/15 0604 02/16/15 0451 02/17/15 0441  WBC 19.8* 8.6  --   HGB 17.9 16.0  --   PLT 205 128* 144*  CREATININE 1.52* 1.29* 1.16   Estimated Creatinine Clearance: 114.4 mL/min (by C-G formula based on Cr of 1.16). No results for input(s): VANCOTROUGH, VANCOPEAK, VANCORANDOM, GENTTROUGH, GENTPEAK, GENTRANDOM, TOBRATROUGH, TOBRAPEAK, TOBRARND, AMIKACINPEAK, AMIKACINTROU, AMIKACIN in the last 72 hours.   Microbiology: Recent Results (from the past 720 hour(s))  Culture, blood (routine x 2)     Status: None (Preliminary result)   Collection Time: 02/11/15  6:38 AM  Result Value Ref Range Status   Specimen Description BLOOD RIGHT HAND  Final   Special Requests BOTTLES DRAWN AEROBIC AND ANAEROBIC  1CC  Final   Culture NO GROWTH 4 DAYS  Final   Report Status PENDING  Incomplete  Culture, blood (routine x 2)     Status: None (Preliminary result)   Collection Time: 02/11/15  8:53 AM  Result Value Ref Range Status   Specimen Description BLOOD LEFT HAND  Final   Special Requests BOTTLES DRAWN AEROBIC AND ANAEROBIC  2CC  Final   Culture NO GROWTH 4 DAYS  Final   Report  Status PENDING  Incomplete  MRSA PCR Screening     Status: None   Collection Time: 02/11/15  2:00 PM  Result Value Ref Range Status   MRSA by PCR NEGATIVE NEGATIVE Final    Comment:        The GeneXpert MRSA Assay (FDA approved for NASAL specimens only), is one component of a comprehensive MRSA colonization surveillance program. It is not intended to diagnose MRSA infection nor to guide or monitor treatment for MRSA infections.   Culture, expectorated sputum-assessment     Status: None   Collection Time: 02/12/15  4:02 PM  Result Value Ref Range Status   Specimen Description EXPECTORATED SPUTUM  Final   Special Requests NONE  Final   Sputum evaluation THIS SPECIMEN IS ACCEPTABLE FOR SPUTUM CULTURE  Final   Report Status 02/12/2015 FINAL  Final  Culture, respiratory (NON-Expectorated)     Status: None   Collection Time: 02/12/15  4:02 PM  Result Value Ref Range Status   Specimen Description EXPECTORATED SPUTUM  Final   Special Requests NONE Reflexed from [id number]  Final   Gram Stain   Final    FAIR SPECIMEN - 70-80% WBCS FEW WBC SEEN FEW GRAM POSITIVE COCCI RARE YEAST RARE GRAM VARIABLE ROD    Culture Consistent with normal respiratory flora.  Final   Report Status 02/15/2015 FINAL  Final    Medical History: Past Medical History  Diagnosis Date  . Hypertension   . Diabetes mellitus without complication ([place])   . COPD (chronic obstructive  pulmonary disease) ([place])   . Hypercholesteremia unk  . Coronary artery disease   . Stroke ([place])   . Sleep apnea     does not use CPAP regularly.  . Sarcoidosis ([place])     Medications:  Scheduled:  . antiseptic oral rinse  7 mL Mouth Rinse 10 times per day  . aspirin  81 mg Oral Daily  . atorvastatin  40 mg Oral QHS  . budesonide (PULMICORT) nebulizer solution  0.5 mg Nebulization BID  . chlorhexidine gluconate  15 mL Mouth Rinse BID  . enoxaparin (LOVENOX) injection  40 mg Subcutaneous BID  . famotidine (PEPCID) IV  20 mg  Intravenous Q12H  . feeding supplement (PRO-STAT SUGAR FREE 64)  30 mL Per Tube Daily  . feeding supplement (VITAL HIGH PROTEIN)  1,000 mL Per Tube Q24H  . free water  100 mL Per Tube 3 times per day  . furosemide  20 mg Intravenous Daily  . insulin aspart  0-15 Units Subcutaneous 6 times per day  . ipratropium-albuterol  3 mL Nebulization Q4H  . levofloxacin (LEVAQUIN) IV  500 mg Intravenous Q24H  . [START ON 02/18/2015] methylPREDNISolone (SOLU-MEDROL) injection  40 mg Intravenous Daily  . senna-docusate  1 tablet Oral QHS  . sodium chloride  10-40 mL Intracatheter Q12H   Infusions:  . fentaNYL infusion INTRAVENOUS Stopped (02/17/15 0255)    Assessment: Pharmacy consulted for medication management, levofloxacin, constipation, electrolytes and glucose, for 45 yo male requiring mechanical ventilation. Patient's current sedation regimen is fentanyl drip with prn lorazepam.     Plan:  1. Levofloxacin: Patient is currently on day day 7/7 of levofloxacin.    2. Constipation: Bowel movement on 1/12. Will continue patient on docusate/senna 1 tabs BID.   3. Electrolytes: Will obtain follow-up electrolytes with am labs on 1/13.   4. Glucose: Patient extubated on BIPAP, will hold Levemir on 1/12.    Pharmacy will continue to monitor and adjust per consult.    Heaton Sarin L 02/17/2015,2:19 PM

## 2015-02-17 NOTE — Care Management (Signed)
patient self extubated self this morning and stable on bipap.  has transitioned to nasal cannula

## 2015-02-17 NOTE — Progress Notes (Signed)
Nutrition Follow-up    INTERVENTION:   Coordination of Care: await diet progression post extubation as medically able; if pt requires re-intubation, recommend restarting TF   NUTRITION DIAGNOSIS:   Inadequate oral intake related to acute illness as evidenced by NPO status. Being addressed via TF  GOAL:   Provide needs based on ASPEN/SCCM guidelines  MONITOR:    (Energy Intake, Anthropometrics, Digestive System, Electrolyte/Renal Profile, Glucose Profile)  REASON FOR ASSESSMENT:   Ventilator    ASSESSMENT:   Pt s/p self-extubation, currently on Bipap   Diet Order:  Diet NPO time specified   EN: TF on hold  Electrolyte and Renal Profile:  Recent Labs Lab 02/15/15 0604 02/15/15 1439 02/16/15 0451 02/17/15 0441  BUN 51*  --  60* 45*  CREATININE 1.52*  --  1.29* 1.16  NA 144  --  147* 148*  K 5.6* 4.9 4.7 5.0   Glucose Profile:  Recent Labs  02/17/15 0400 02/17/15 0747 02/17/15 1150  GLUCAP 154* 181* 129*   Meds: lasix, ss novolog, levemir, solumedrol  Height:   Ht Readings from Last 1 Encounters:  02/11/15 6' (1.829 m)    Weight:   Wt Readings from Last 1 Encounters:  02/17/15 291 lb 10.7 oz (132.3 kg)   Filed Weights   02/15/15 0446 02/16/15 0500 02/17/15 0443  Weight: 295 lb 3.1 oz (133.9 kg) 292 lb 1.8 oz (132.5 kg) 291 lb 10.7 oz (132.3 kg)    BMI:  Body mass index is 39.55 kg/(m^2).  Estimated Nutritional Needs:   Kcal:  [id number] (11-14 kcals/kg) using wt of 133.8  Protein:  122-162 g (1.5-2.0 g/kg) using IBW 81 kg  Fluid:  2025-2430 mL (25-30 ml/kg)   HIGH Care Level  Kerman Passey MS, RD, LDN [phone] Pager  [phone] Weekend/On-Call Pager

## 2015-02-17 NOTE — Progress Notes (Signed)
Report given to night shift nurse. Pt is in stable condition with O2 sats in the 90's, resting comfortably. Denies pain.

## 2015-02-17 NOTE — Progress Notes (Signed)
[facility] Progess Note    ASSESSMENT/PLAN  45 yo AAM with acute resp failure from acute COPD exacerbation with h/o sarcoidosis, with  pneumonia Self extubated early this AM-placed on biPAP-resp status tenious-high risk for re-intubation   Assessment and Plan:  Acute exacerbation of COPD with acute hypercapnic respiratory failure/pneumonia -Respiratory Failure -self extubated-biPAP as needed, wean as tolerated -continue steroids, ABX and BD therapy   Sarcoidosis. - We'll continue the patient on steroids.   DVt/GI prx ordered   MICRO DATA: Blood culture again was 07/26/2015 negative. Sputum culture 02/12/2015: Pending      Name: [name] MRN: [id number] DOB: 1970/06/11    ADMISSION DATE:  02/11/2015     SUBJECTIVE:  Patient lethargic, intermittently follows commands Wife at bedside Plan for biPAP as needed, wean as tolerated  Review of Systems:  Unobtainable to critical illness, lethargic and on biPAP   VITAL SIGNS: Temp:  [98.8 F (37.1 C)-99.5 F (37.5 C)] 98.8 F (37.1 C) (01/12 0400) Pulse Rate:  [97-124] 100 (01/12 0400) Resp:  [14-23] 23 (01/12 0400) BP: (113-142)/(71-89) 130/84 mmHg (01/12 0400) SpO2:  [89 %-98 %] 94 % (01/12 0400) FiO2 (%):  [35 %] 35 % (01/12 0741) Weight:  [291 lb 10.7 oz (132.3 kg)] 291 lb 10.7 oz (132.3 kg) (01/12 0443) HEMODYNAMICS:   VENTILATOR SETTINGS: Vent Mode:  [-] PRVC FiO2 (%):  [35 %] 35 % Set Rate:  [14 bmp] 14 bmp Vt Set:  [9 mL-600 mL] 600 mL PEEP:  [2 cmH20-5 cmH20] 2 cmH20 Pressure Support:  [16 cmH20] 16 cmH20 INTAKE / OUTPUT:  Intake/Output Summary (Last 24 hours) at 02/17/15 0827 Last data filed at 02/17/15 0525  Gross per 24 hour  Intake 1471.33 ml  Output   4775 ml  Net -3303.67 ml    PHYSICAL EXAMINATION: Physical Examination:   VS: BP 130/84 mmHg  Pulse 100  Temp(Src) 98.8 F (37.1 C) (Axillary)  Resp 23  Ht 6' (1.829 m)  Wt 291 lb 10.7 oz (132.3 kg)   BMI 39.55 kg/m2  SpO2 94%  General Appearance: mild distress  Neuro:without focal findings, HEENT: PERRLA, EOM intact. Pulmonary: normal breath sounds   CardiovascularNormal S1,S2.  No m/r/g.   Abdomen: Benign, Soft, non-tender. Skin:   warm, no rashes, no ecchymosis  Extremities: normal, no cyanosis, clubbing.     LABORATORY PANEL:   CBC  Recent Labs Lab 02/16/15 0451 02/17/15 0441  WBC 8.6  --   HGB 16.0  --   HCT 51.9  --   PLT 128* 144*    Chemistries   Recent Labs Lab 02/17/15 0441  NA 148*  K 5.0  CL 108  CO2 42*  GLUCOSE 174*  BUN 45*  CREATININE 1.16  CALCIUM 8.3*     Recent Labs Lab 02/16/15 1205 02/16/15 1603 02/16/15 1950 02/16/15 2337 02/17/15 0400 02/17/15 0747  GLUCAP 214* 176* 187* 185* 154* 181*    Recent Labs Lab 02/13/15 0342 02/14/15 0500 02/15/15 0845  PHART 7.46* 7.35 7.11*  PCO2ART 49* 64* 130*  PO2ART 110* 67* 88   No results for input(s): AST, ALT, ALKPHOS, BILITOT, ALBUMIN in the last 168 hours.  Cardiac Enzymes  Recent Labs Lab 02/11/15 0638  TROPONINI <0.03    RADIOLOGY:  Ct Chest Wo Contrast  02/15/2015  CLINICAL DATA:  Cough, shortness of breath, intubated EXAM: CT CHEST WITHOUT CONTRAST TECHNIQUE: Multidetector CT imaging of the chest was performed following the standard protocol without IV contrast. COMPARISON:  Chest radiograph dated 02/15/2015. CT chest dated 08/15/2014. FINDINGS: Mediastinum/Nodes: Mild cardiomegaly.  No pericardial effusion. Small mediastinal lymph nodes which do not meet pathologic CT size criteria. Right arm PICC terminates at the cavoatrial junction. Visualized thyroid is unremarkable. Lungs/Pleura: Endotracheal tube terminates 5.5 cm above the carina. Evaluation of the lung parenchyma is constrained by respiratory motion. Scattered areas of scarring with architectural distortion. Right lower lobe volume loss with bronchiectasis. Superimposed mild patchy bilateral lower lobe opacities,  likely atelectasis. Pneumonia is considered unlikely. Underlying mild to moderate centrilobular and paraseptal emphysematous changes. Small bilateral pleural effusions.  No pneumothorax. Upper abdomen: Visualized upper abdomen is notable for multiple gallstones measuring up to 3.0 cm (series 2/ image 52) and an enteric tube terminating in the gastric body. Musculoskeletal: Mild degenerative changes of the visualized thoracolumbar spine. IMPRESSION: Endotracheal tube terminates 5.5 cm above the carina. Small bilateral pleural effusions. Associated mild patchy bilateral lower lobe opacities, likely atelectasis. Pneumonia is considered unlikely. Underlying chronic scarring with architectural distortion bilaterally with mild to moderate emphysematous changes. Electronically Signed   By: Julian Hy M.D.   On: 02/15/2015 16:48   Dg Chest Port 1 View  02/15/2015  CLINICAL DATA:  Evaluation of PICC line placement. EXAM: PORTABLE CHEST 1 VIEW COMPARISON:  02/14/2015 FINDINGS: There has been a right subclavian approach PICC line placement with tip at the expected location of cavoatrial junction. Endotracheal tube is unchanged in positioning, terminating approximately 5.5 cm superior to the carina. Enteric catheter transverses the thorax. The cardiac silhouette is enlarged. Mediastinal contours appear intact. There are bilateral scattered patchy airspace and alveolar infiltrates, slightly more prominent than on several previous radiographs. There may be a small left pleural effusion. There is no evidence of pneumothorax. Osseous structures are without acute abnormality. Soft tissues are grossly normal. IMPRESSION: Status post right subclavian approach PICC line placement. No evidence of pneumothorax. Slight increase in bilateral patchy airspace and alveolar opacities. Probable small left pleural effusion. Electronically Signed   By: Fidela Salisbury M.D.   On: 02/15/2015 15:54    The Patient requires high  complexity decision making for assessment and support, frequent evaluation and titration of therapies, application of advanced monitoring technologies and extensive interpretation of multiple databases. Critical Care Time devoted to patient care services described in this note is 35 minutes.   Overall, patient is critically ill, prognosis is guarded.  High risk for re-intubation   Corrin Parker, M.D.  [facility]  Medical Director [place] Director [facility] Cardio-Pulmonary Department

## 2015-02-17 NOTE — Progress Notes (Signed)
Pt self-extubated at 0250.  Fentanyl stopped, pt placed on NRB, no c/o SOB, VSS, pt A&O, and elink informed.  Subsequently pt placed on biPap at 35%, VS remain stable.  Pt wife informed of pt self-extubation and current status.  Will continue to monitor.

## 2015-02-17 NOTE — Progress Notes (Signed)
Called for pt self-extubation. Pt being placed on NRB, spo2 97%. Pt is awake and responding to questions. Called Elink to get BIPAP order. Pt wears Bipap at home for OSA. Pt is on Bipap at this time 16/10, RR 10, FIO2 .35. HR 100, SPO2 96%, RR 25. Will cont. To monitor pt.

## 2015-02-17 NOTE — Progress Notes (Signed)
[place] [facility] [place] NAME: [name]    MR#:  [id number]  DATE OF BIRTH:  1970/05/29  SUBJECTIVE:  CHIEF COMPLAINT:   Chief Complaint  Patient presents with  . Shortness of Breath   patient is 45 year old African-American male with history of hypertension, COPD, diabetes, coronary artery disease, hyperlipidemia who presented to the hospital with complaints of cough, shortness of breath worsening over a period of time and not improving with antibiotic therapy. Patient is admitted to the hospital and eventually intubated due to worsening shortness of breath. He remains on mechanical ventilation. Review of system is unavailable. Failed weaning attempt Lewie Chamber however, self extubated earlier today.  Initially was placed on BiPAP. However, now on 5 L of oxygen through nasal cannula, denies any shortness of breath .  CT scan of the chest revealed bilateral pleural effusions, atelectasis, moderate emphysema.  High risk for reintubation    . Review of Systems  Unable to perform ROS: intubated    VITAL SIGNS: Blood pressure 130/88, pulse 91, temperature 99.4 F (37.4 C), temperature source Axillary, resp. rate 22, height 6' (1.829 m), weight 132.3 kg (291 lb 10.7 oz), SpO2 92 %.  PHYSICAL EXAMINATION:   GENERAL:  45 y.o.-year-old patient lying in the bed with no acute distress. On oxygen through nasal cannula as  EYES: Pupils equal, round, reactive to light and accommodation. No scleral icterus. Extraocular muscles intact.  HEENT: Head atraumatic, normocephalic. Oropharynx and nasopharynx clear.  NECK:  Supple, no jugular venous distention. No thyroid enlargement, no tenderness.  LUNGS: Diminished breath sounds bilaterally, bilateral scattered rhonchi , no crepitations, no wheezing. No use of accessory muscles of respiration.  CARDIOVASCULAR: S1, S2 normal. No murmurs, rubs, or gallops.  ABDOMEN: Soft, nontender, nondistended. Bowel  sounds present. No organomegaly or mass.  EXTREMITIES: No pedal edema, cyanosis, or clubbing.  NEUROLOGIC: Cranial nerves II through XII are intact. Muscle strength 5/5 in all extremities. Sensation intact. Gait not checked.  PSYCHIATRIC: The patient is somnolent, but able to answer questions and follows commands SKIN: No obvious rash, lesion, or ulcer.   ORDERS/RESULTS REVIEWED:   CBC  Recent Labs Lab 02/12/15 0458 02/13/15 0402 02/14/15 0357 02/15/15 0604 02/16/15 0451 02/17/15 0441  WBC 7.8 13.5* 18.4* 19.8* 8.6  --   HGB 15.9 15.9 17.3 17.9 16.0  --   HCT 50.0 49.7 55.1* 58.6* 51.9  --   PLT 169 167 187 205 128* 144*  MCV 85.0 82.1 84.2 85.7 85.4  --   MCH 27.0 26.2 26.4 26.1 26.3  --   MCHC 31.8* 31.9* 31.4* 30.5* 30.8*  --   RDW 14.8* 15.4* 15.6* 15.8* 15.6*  --    ------------------------------------------------------------------------------------------------------------------  Chemistries   Recent Labs Lab 02/13/15 0402 02/14/15 0357 02/15/15 0604 02/15/15 1439 02/16/15 0451 02/17/15 0441  NA 143 143 144  --  147* 148*  K 4.0 4.6 5.6* 4.9 4.7 5.0  CL 101 101 100*  --  106 108  CO2 34* 37* 37*  --  37* 42*  GLUCOSE 167* 215* 160*  --  197* 174*  BUN 28* 33* 51*  --  60* 45*  CREATININE 1.36* 1.16 1.52*  --  1.29* 1.16  CALCIUM 8.6* 8.5* 8.0*  --  7.9* 8.3*   ------------------------------------------------------------------------------------------------------------------ estimated creatinine clearance is 114.4 mL/min (by C-G formula based on Cr of 1.16). ------------------------------------------------------------------------------------------------------------------ No results for input(s): TSH, T4TOTAL, T3FREE, THYROIDAB in the last 72 hours.  Invalid input(s): FREET3  Cardiac Enzymes  Recent Labs Lab 02/11/15 0638  TROPONINI <0.03    ------------------------------------------------------------------------------------------------------------------ Invalid input(s): POCBNP ---------------------------------------------------------------------------------------------------------------  RADIOLOGY: Ct Chest Wo Contrast  02/15/2015  CLINICAL DATA:  Cough, shortness of breath, intubated EXAM: CT CHEST WITHOUT CONTRAST TECHNIQUE: Multidetector CT imaging of the chest was performed following the standard protocol without IV contrast. COMPARISON:  Chest radiograph dated 02/15/2015. CT chest dated 08/15/2014. FINDINGS: Mediastinum/Nodes: Mild cardiomegaly.  No pericardial effusion. Small mediastinal lymph nodes which do not meet pathologic CT size criteria. Right arm PICC terminates at the cavoatrial junction. Visualized thyroid is unremarkable. Lungs/Pleura: Endotracheal tube terminates 5.5 cm above the carina. Evaluation of the lung parenchyma is constrained by respiratory motion. Scattered areas of scarring with architectural distortion. Right lower lobe volume loss with bronchiectasis. Superimposed mild patchy bilateral lower lobe opacities, likely atelectasis. Pneumonia is considered unlikely. Underlying mild to moderate centrilobular and paraseptal emphysematous changes. Small bilateral pleural effusions.  No pneumothorax. Upper abdomen: Visualized upper abdomen is notable for multiple gallstones measuring up to 3.0 cm (series 2/ image 52) and an enteric tube terminating in the gastric body. Musculoskeletal: Mild degenerative changes of the visualized thoracolumbar spine. IMPRESSION: Endotracheal tube terminates 5.5 cm above the carina. Small bilateral pleural effusions. Associated mild patchy bilateral lower lobe opacities, likely atelectasis. Pneumonia is considered unlikely. Underlying chronic scarring with architectural distortion bilaterally with mild to moderate emphysematous changes. Electronically Signed   By: Julian Hy M.D.    On: 02/15/2015 16:48   Dg Chest Port 1 View  02/15/2015  CLINICAL DATA:  Evaluation of PICC line placement. EXAM: PORTABLE CHEST 1 VIEW COMPARISON:  02/14/2015 FINDINGS: There has been a right subclavian approach PICC line placement with tip at the expected location of cavoatrial junction. Endotracheal tube is unchanged in positioning, terminating approximately 5.5 cm superior to the carina. Enteric catheter transverses the thorax. The cardiac silhouette is enlarged. Mediastinal contours appear intact. There are bilateral scattered patchy airspace and alveolar infiltrates, slightly more prominent than on several previous radiographs. There may be a small left pleural effusion. There is no evidence of pneumothorax. Osseous structures are without acute abnormality. Soft tissues are grossly normal. IMPRESSION: Status post right subclavian approach PICC line placement. No evidence of pneumothorax. Slight increase in bilateral patchy airspace and alveolar opacities. Probable small left pleural effusion. Electronically Signed   By: Fidela Salisbury M.D.   On: 02/15/2015 15:54    EKG:  Orders placed or performed during the hospital encounter of 02/11/15  . ED EKG  . ED EKG  . EKG 12-Lead  . EKG 12-Lead    ASSESSMENT AND PLAN:  Active Problems:   Acute respiratory failure with hypoxia and hypercarbia (HCC)   COPD exacerbation (HCC)   Dyspnea  #1. Acute on chronic respiratory failure with hypoxia and hypercapnia due to COPD exacerbation, status post intubation January 6 and self extubation January 12 , continue patient on oxygen per nasal cannulas, duo nebs, Pulmicort, levofloxacin, steroid injections,  appreciate pulmonologist input. Continue low-dose Lasix as CT of the chest without contrast revealed bilateral pleural effusions.   #2. Acute bronchitis, continue antibiotic therapy. sputum cultures showed normal flora, MRSA screen by PCR was negative, blood cultures were negative  #3. COPD  exacerbation, as above. Continue conservative therapy, watching closely for deterioration and reintubation #4. Diabetes mellitus type 2. Continue patient on Levemir and NovoLog, sliding scale insulin. Blood glucose levels are ranging between 160s to 200s, advanced insulin Levemir to 21 units, tapering off steroids  #5 . Acute on  chronic renal failure , superimposed on renal insufficiency, chronic , stage II -III ,  Follow urinary output as well as creatinine closely #6. Thrombocytopenia, some better today #7. Hypernatremia. Initiate patient on D5 water solution, follow sodium level in the morning Management plans discussed with the patient, family and they are in agreement.   DRUG ALLERGIES:  Allergies  Allergen Reactions  . Penicillins Anaphylaxis and Other (See Comments)    Unable to obtain enough information to answer additional questions about this medication.      CODE STATUS:     Code Status Orders        Start     Ordered   02/11/15 1417  Full code   Continuous     02/11/15 1417      TOTAL CRITICAL CARE TIME TAKING CARE OF THIS PATIENT: 40 minutes.  Discussed with patient's wife  Raeleen Winstanley M.D on 02/17/2015 at 2:40 PM  Between 7am to 6pm - Pager - [phone]  After 6pm go to [email or web address] - password [email or web address] [facility]  Office  [phone]  CC: Primary care physician; [facility]

## 2015-02-18 LAB — BASIC METABOLIC PANEL
Anion gap: 5 (ref 5–15)
BUN: 37 mg/dL — AB (ref 6–20)
CALCIUM: 7.9 mg/dL — AB (ref 8.9–10.3)
CO2: 35 mmol/L — ABNORMAL HIGH (ref 22–32)
CREATININE: 1.3 mg/dL — AB (ref 0.61–1.24)
Chloride: 97 mmol/L — ABNORMAL LOW (ref 101–111)
GFR calc Af Amer: >60 mL/min (ref >60)
GLUCOSE: 300 mg/dL — AB (ref 65–99)
POTASSIUM: 4.1 mmol/L (ref 3.5–5.1)
SODIUM: 137 mmol/L (ref 135–145)

## 2015-02-18 LAB — GLUCOSE, CAPILLARY
GLUCOSE-CAPILLARY: 169 mg/dL — AB (ref 65–99)
Glucose-Capillary: 103 mg/dL — ABNORMAL HIGH (ref 65–99)
Glucose-Capillary: 121 mg/dL — ABNORMAL HIGH (ref 65–99)
Glucose-Capillary: 169 mg/dL — ABNORMAL HIGH (ref 65–99)
Glucose-Capillary: 93 mg/dL (ref 65–99)

## 2015-02-18 MED ORDER — PREDNISONE 20 MG PO TABS
40.0000 mg | ORAL_TABLET | Freq: Every day | ORAL | Status: DC
  Administered 2015-02-19 – 2015-02-21 (×3): 40 mg via ORAL
  Filled 2015-02-18 (×3): qty 2

## 2015-02-18 MED ORDER — CLONIDINE HCL 0.2 MG/24HR TD PTWK
0.2000 mg | MEDICATED_PATCH | TRANSDERMAL | Status: DC
  Administered 2015-02-18: 0.2 mg via TRANSDERMAL
  Filled 2015-02-18: qty 1

## 2015-02-18 MED ORDER — TIOTROPIUM BROMIDE MONOHYDRATE 18 MCG IN CAPS
18.0000 ug | ORAL_CAPSULE | Freq: Every day | RESPIRATORY_TRACT | Status: DC
  Administered 2015-02-18 – 2015-02-21 (×4): 18 ug via RESPIRATORY_TRACT
  Filled 2015-02-18: qty 5

## 2015-02-18 MED ORDER — MOMETASONE FURO-FORMOTEROL FUM 100-5 MCG/ACT IN AERO
2.0000 | INHALATION_SPRAY | Freq: Two times a day (BID) | RESPIRATORY_TRACT | Status: DC
Start: 2015-02-18 — End: 2015-02-21
  Administered 2015-02-18 – 2015-02-21 (×6): 2 via RESPIRATORY_TRACT
  Filled 2015-02-18: qty 8.8

## 2015-02-18 MED ORDER — METOPROLOL TARTRATE 100 MG PO TABS
100.0000 mg | ORAL_TABLET | Freq: Two times a day (BID) | ORAL | Status: DC
  Administered 2015-02-18 – 2015-02-21 (×6): 100 mg via ORAL
  Filled 2015-02-18 (×6): qty 1

## 2015-02-18 MED ORDER — FUROSEMIDE 20 MG PO TABS: 20.0000 mg | ORAL_TABLET | Freq: Every day | ORAL | Status: DC

## 2015-02-18 MED ORDER — ENSURE ENLIVE PO LIQD
237.0000 mL | Freq: Two times a day (BID) | ORAL | Status: DC
  Administered 2015-02-18 – 2015-02-21 (×5): 237 mL via ORAL

## 2015-02-18 MED ORDER — INSULIN ASPART 100 UNIT/ML ~~LOC~~ SOLN
0.0000 [IU] | Freq: Three times a day (TID) | SUBCUTANEOUS | Status: DC
  Administered 2015-02-18: 3 [IU] via SUBCUTANEOUS
  Administered 2015-02-19: 5 [IU] via SUBCUTANEOUS
  Administered 2015-02-19 – 2015-02-20 (×3): 3 [IU] via SUBCUTANEOUS
  Administered 2015-02-20 – 2015-02-21 (×3): 5 [IU] via SUBCUTANEOUS
  Administered 2015-02-21: 2 [IU] via SUBCUTANEOUS
  Filled 2015-02-18 (×2): qty 5
  Filled 2015-02-18: qty 1
  Filled 2015-02-18: qty 2
  Filled 2015-02-18: qty 5
  Filled 2015-02-18 (×2): qty 3
  Filled 2015-02-18: qty 5
  Filled 2015-02-18: qty 3
  Filled 2015-02-18: qty 2

## 2015-02-18 MED ORDER — INSULIN ASPART 100 UNIT/ML ~~LOC~~ SOLN
0.0000 [IU] | Freq: Every day | SUBCUTANEOUS | Status: DC
  Administered 2015-02-19 – 2015-02-20 (×2): 2 [IU] via SUBCUTANEOUS
  Filled 2015-02-18 (×2): qty 2

## 2015-02-18 NOTE — Progress Notes (Signed)
[facility] Progess Note    ASSESSMENT/PLAN  45 yo AAM with acute resp failure from acute COPD exacerbation with h/o sarcoidosis, with  pneumonia Self extubated 2 days ago, resp status doing well this AM   Assessment and Plan:OK to transgfer to gen med floor today, d/c foley catheter  Acute exacerbation of COPD with acute hypercapnic respiratory failure/pneumonia -Respiratory Failure-resolving -recommned biPAP as needed and at night -continue steroids, ABX and BD therapy   Sarcoidosis. - We'll continue the patient on steroids.   DVt/GI prx ordered   MICRO DATA: Blood culture again was 07/26/2015 negative. Sputum culture 02/12/2015: Pending      Name: RODENY DEUSCHLE MRN: [id number] DOB: March 26, 1970    ADMISSION DATE:  02/11/2015     SUBJECTIVE:  Patient lethargic, intermittently follows commands resp status doing well, on minimal oxygen support Hoarse voice, NAD  Review of Systems  Constitutional: Positive for malaise/fatigue. Negative for fever, chills and weight loss.  Respiratory: Negative for cough, hemoptysis, sputum production, shortness of breath and wheezing.   Cardiovascular: Negative for chest pain.  Gastrointestinal: Negative for nausea and vomiting.  Musculoskeletal: Negative.   Skin: Negative for rash.  All other systems reviewed and are negative.     VITAL SIGNS: Temp:  [98.2 F (36.8 C)-98.3 F (36.8 C)] 98.3 F (36.8 C) (01/13 0200) Pulse Rate:  [89-109] 101 (01/13 0700) Resp:  [19-35] 19 (01/13 0700) BP: (130-158)/(83-106) 145/98 mmHg (01/13 0700) SpO2:  [86 %-98 %] 94 % (01/13 0800) FiO2 (%):  [35 %-36 %] 36 % (01/13 0800) Weight:  [285 lb 7.9 oz (129.5 kg)] 285 lb 7.9 oz (129.5 kg) (01/13 0500) HEMODYNAMICS:   VENTILATOR SETTINGS: Vent Mode:  [-]  FiO2 (%):  [35 %-36 %] 36 % INTAKE / OUTPUT:  Intake/Output Summary (Last 24 hours) at 02/18/15 0812 Last data filed at 02/18/15 0600  Gross per 24 hour    Intake 753.33 ml  Output   5250 ml  Net -4496.67 ml    PHYSICAL EXAMINATION: Physical Examination:   VS: BP 145/98 mmHg  Pulse 101  Temp(Src) 98.3 F (36.8 C) (Axillary)  Resp 19  Ht 6' (1.829 m)  Wt 285 lb 7.9 oz (129.5 kg)  BMI 38.71 kg/m2  SpO2 94%  General Appearance:no distress, no stridor Neuro:without focal findings, HEENT: PERRLA, EOM intact. Pulmonary: normal breath sounds   CardiovascularNormal S1,S2.  No m/r/g.   Abdomen: Benign, Soft, non-tender. Skin:   warm, no rashes, no ecchymosis  Extremities: normal, no cyanosis, clubbing.     LABORATORY PANEL:   CBC  Recent Labs Lab 02/16/15 0451 02/17/15 0441  WBC 8.6  --   HGB 16.0  --   HCT 51.9  --   PLT 128* 144*    Chemistries   Recent Labs Lab 02/18/15 0604  NA 137  K 4.1  CL 97*  CO2 35*  GLUCOSE 300*  BUN 37*  CREATININE 1.30*  CALCIUM 7.9*     Recent Labs Lab 02/17/15 1150 02/17/15 1604 02/17/15 1954 02/17/15 2356 02/18/15 0354 02/18/15 0745  GLUCAP 129* 111* 129* 121* 93 103*    Recent Labs Lab 02/13/15 0342 02/14/15 0500 02/15/15 0845  PHART 7.46* 7.35 7.11*  PCO2ART 49* 64* 130*  PO2ART 110* 67* 88   No results for input(s): AST, ALT, ALKPHOS, BILITOT, ALBUMIN in the last 168 hours.  Cardiac Enzymes No results for input(s): TROPONINI in the last 168 hours.  RADIOLOGY:  No results found. The Patient requires high  complexity decision making for assessment and support, frequent evaluation and titration of therapies.  OK to transfer to gen med floor  Kaydyn Sayas Patricia Pesa, M.D.  [facility]  Medical Director [place] Director [facility] Cardio-Pulmonary Department

## 2015-02-18 NOTE — Progress Notes (Signed)
Rehab Admissions Coordinator Note:  Patient was screened by Cleatrice Burke for appropriateness for an Inpatient Acute Rehab Consult per PT and OT recommendation.   At this time, we are recommending SNF rehab. [employer] will not approve an inpt acute hospital rehab admission for this diagnosis. Please call me for any questions.  Cleatrice Burke 02/18/2015, 9:40 PM  I can be reached at [phone].

## 2015-02-18 NOTE — Progress Notes (Signed)
ANTIBIOTIC CONSULT NOTE - FOLLOW UP   Pharmacy Consult for ICU medication management Indication: rule out pneumonia  Allergies  Allergen Reactions  . Penicillins Anaphylaxis and Other (See Comments)    Unable to obtain enough information to answer additional questions about this medication.      Patient Measurements: Height: 6' (182.9 cm) Weight: 285 lb 7.9 oz (129.5 kg) IBW/kg (Calculated) : 77.6  Vital Signs: Temp: 97.4 F (36.3 C) (01/13 0800) Temp Source: Oral (01/13 0800) BP: 149/102 mmHg (01/13 1100) Pulse Rate: 95 (01/13 1100) Intake/Output from previous day: 01/12 0701 - 01/13 0700 In: 793.3 [I.V.:593.3; IV Piggyback:200] Out: [id number] [Urine:4850; Emesis/NG output:400] Intake/Output from this shift: Total I/O In: 518 [P.O.:478; I.V.:40] Out: 550 [Urine:550]  Labs:  Recent Labs  02/16/15 0451 02/17/15 0441 02/18/15 0604  WBC 8.6  --   --   HGB 16.0  --   --   PLT 128* 144*  --   CREATININE 1.29* 1.16 1.30*   Estimated Creatinine Clearance: 100.9 mL/min (by C-G formula based on Cr of 1.3). No results for input(s): VANCOTROUGH, VANCOPEAK, VANCORANDOM, GENTTROUGH, GENTPEAK, GENTRANDOM, TOBRATROUGH, TOBRAPEAK, TOBRARND, AMIKACINPEAK, AMIKACINTROU, AMIKACIN in the last 72 hours.   Microbiology: Recent Results (from the past 720 hour(s))  Culture, blood (routine x 2)     Status: None   Collection Time: 02/11/15  6:38 AM  Result Value Ref Range Status   Specimen Description BLOOD RIGHT HAND  Final   Special Requests BOTTLES DRAWN AEROBIC AND ANAEROBIC  1CC  Final   Culture NO GROWTH 6 DAYS  Final   Report Status 02/17/2015 FINAL  Final  Culture, blood (routine x 2)     Status: None   Collection Time: 02/11/15  8:53 AM  Result Value Ref Range Status   Specimen Description BLOOD LEFT HAND  Final   Special Requests BOTTLES DRAWN AEROBIC AND ANAEROBIC  2CC  Final   Culture NO GROWTH 6 DAYS  Final   Report Status 02/17/2015 FINAL  Final  MRSA PCR Screening      Status: None   Collection Time: 02/11/15  2:00 PM  Result Value Ref Range Status   MRSA by PCR NEGATIVE NEGATIVE Final    Comment:        The GeneXpert MRSA Assay (FDA approved for NASAL specimens only), is one component of a comprehensive MRSA colonization surveillance program. It is not intended to diagnose MRSA infection nor to guide or monitor treatment for MRSA infections.   Culture, expectorated sputum-assessment     Status: None   Collection Time: 02/12/15  4:02 PM  Result Value Ref Range Status   Specimen Description EXPECTORATED SPUTUM  Final   Special Requests NONE  Final   Sputum evaluation THIS SPECIMEN IS ACCEPTABLE FOR SPUTUM CULTURE  Final   Report Status 02/12/2015 FINAL  Final  Culture, respiratory (NON-Expectorated)     Status: None   Collection Time: 02/12/15  4:02 PM  Result Value Ref Range Status   Specimen Description EXPECTORATED SPUTUM  Final   Special Requests NONE Reflexed from [id number]  Final   Gram Stain   Final    FAIR SPECIMEN - 70-80% WBCS FEW WBC SEEN FEW GRAM POSITIVE COCCI RARE YEAST RARE GRAM VARIABLE ROD    Culture Consistent with normal respiratory flora.  Final   Report Status 02/15/2015 FINAL  Final    Medical History: Past Medical History  Diagnosis Date  . Hypertension   . Diabetes mellitus without complication ([place])   . COPD (  chronic obstructive pulmonary disease) ([place])   . Hypercholesteremia unk  . Coronary artery disease   . Stroke ([place])   . Sleep apnea     does not use CPAP regularly.  . Sarcoidosis ([place])     Medications:  Scheduled:  . aspirin  81 mg Oral Daily  . atorvastatin  40 mg Oral QHS  . budesonide (PULMICORT) nebulizer solution  0.5 mg Nebulization BID  . chlorhexidine gluconate  15 mL Mouth Rinse BID  . enoxaparin (LOVENOX) injection  40 mg Subcutaneous BID  . feeding supplement (ENSURE ENLIVE)  237 mL Oral BID WC  . [START ON 02/19/2015] furosemide  20 mg Oral Daily  . insulin aspart  0-15 Units  Subcutaneous 6 times per day  . ipratropium-albuterol  3 mL Nebulization Q4H  . [START ON 02/19/2015] predniSONE  40 mg Oral Q breakfast  . senna-docusate  1 tablet Oral QHS  . sodium chloride  10-40 mL Intracatheter Q12H   Infusions:  . fentaNYL infusion INTRAVENOUS Stopped (02/17/15 0255)    Assessment: Pharmacy consulted for medication management for constipation, electrolytes and glucose, for 45 yo male now off mechanical ventilation and sedation.   Plan:  1. Constipation: Bowel movement on 1/12. Will continue patient on docusate/senna 1 tabs QHS.   2. Electrolytes: WNL. Will obtain follow-up electrolytes with am labs on 1/15.   3. Glucose: Patient previously ordered Levemir. Patient used 4 u SSI in previous 24hours. Will change SSI to with meals and at bedtime.   Pharmacy will continue to monitor and adjust per consult.    Macall Mccroskey L 02/18/2015,1:17 PM

## 2015-02-18 NOTE — Progress Notes (Signed)
[facility] NAME: [name]    MR#:  [id number]  DATE OF BIRTH:  [birth year]  SUBJECTIVE:  CHIEF COMPLAINT:   Chief Complaint  Patient presents with  . Shortness of Breath   patient is 45 year old African-American male with history of hypertension, COPD, diabetes, coronary artery disease, hyperlipidemia who presented to the hospital with complaints of cough, shortness of breath worsening over a period of time and not improving with antibiotic therapy. Patient is admitted to the hospital and eventually intubated due to worsening shortness of breath. Self extubated January 12.  Initially was placed on BiPAP. However, now on 4 L of oxygen through nasal cannula, denies any shortness of breath .  CT scan of the chest revealed bilateral pleural effusions, atelectasis, moderate emphysema.  High risk for reintubation. As CPAP mask at home, did not initiated yet.     . Review of Systems  Unable to perform ROS: intubated    VITAL SIGNS: Blood pressure 149/102, pulse 95, temperature 97.4 F (36.3 C), temperature source Oral, resp. rate 20, height 6' (1.829 m), weight 129.5 kg (285 lb 7.9 oz), SpO2 91 %.  PHYSICAL EXAMINATION:   GENERAL:  45 y.o.-year-old patient lying in the bed with no acute distress. On oxygen through nasal cannula. Some slurring of speech  EYES: Pupils equal, round, reactive to light and accommodation. No scleral icterus. Extraocular muscles intact.  HEENT: Head atraumatic, normocephalic. Oropharynx and nasopharynx clear.  NECK:  Supple, no jugular venous distention. No thyroid enlargement, no tenderness.  LUNGS: Diminished breath sounds bilaterally, bilateral scattered rhonchi , no crepitations, no wheezing. No use of accessory muscles of respiration.  CARDIOVASCULAR: S1, S2 normal. No murmurs, rubs, or gallops.  ABDOMEN: Soft, nontender, nondistended. Bowel sounds present. No organomegaly or mass.  EXTREMITIES:  No pedal edema, cyanosis, or clubbing.  NEUROLOGIC: Cranial nerves II through XII are intact. Muscle strength 5/5 in all extremities. Sensation intact. Gait not checked.  PSYCHIATRIC: The patient is somnolent, but able to answer questions and follows commands SKIN: No obvious rash, lesion, or ulcer.   ORDERS/RESULTS REVIEWED:   CBC  Recent Labs Lab 02/12/15 0458 02/13/15 0402 02/14/15 0357 02/15/15 0604 02/16/15 0451 02/17/15 0441  WBC 7.8 13.5* 18.4* 19.8* 8.6  --   HGB 15.9 15.9 17.3 17.9 16.0  --   HCT 50.0 49.7 55.1* 58.6* 51.9  --   PLT 169 167 187 205 128* 144*  MCV 85.0 82.1 84.2 85.7 85.4  --   MCH 27.0 26.2 26.4 26.1 26.3  --   MCHC 31.8* 31.9* 31.4* 30.5* 30.8*  --   RDW 14.8* 15.4* 15.6* 15.8* 15.6*  --    ------------------------------------------------------------------------------------------------------------------  Chemistries   Recent Labs Lab 02/14/15 0357 02/15/15 0604 02/15/15 1439 02/16/15 0451 02/17/15 0441 02/18/15 0604  NA 143 144  --  147* 148* 137  K 4.6 5.6* 4.9 4.7 5.0 4.1  CL 101 100*  --  106 108 97*  CO2 37* 37*  --  37* 42* 35*  GLUCOSE 215* 160*  --  197* 174* 300*  BUN 33* 51*  --  60* 45* 37*  CREATININE 1.16 1.52*  --  1.29* 1.16 1.30*  CALCIUM 8.5* 8.0*  --  7.9* 8.3* 7.9*   ------------------------------------------------------------------------------------------------------------------ estimated creatinine clearance is 100.9 mL/min (by C-G formula based on Cr of 1.3). ------------------------------------------------------------------------------------------------------------------ No results for input(s): TSH, T4TOTAL, T3FREE, THYROIDAB in the last 72 hours.  Invalid input(s): FREET3  Cardiac Enzymes  No results for input(s): CKMB, TROPONINI, MYOGLOBIN in the last 168 hours.  Invalid input(s): CK ------------------------------------------------------------------------------------------------------------------ Invalid  input(s): POCBNP ---------------------------------------------------------------------------------------------------------------  RADIOLOGY: No results found.  EKG:  Orders placed or performed during the hospital encounter of 02/11/15  . ED EKG  . ED EKG  . EKG 12-Lead  . EKG 12-Lead    ASSESSMENT AND PLAN:  Active Problems:   Acute respiratory failure with hypoxia and hypercarbia (HCC)   COPD exacerbation (HCC)   Dyspnea  #1. Acute on chronic respiratory failure with hypoxia and hypercapnia due to COPD exacerbation, status post intubation January 6 and self extubation January 12 , continue patient on oxygen per nasal cannulas, duo nebs, Pulmicort, levofloxacin, steroid injections,  appreciate pulmonologist input. Continue low-dose Lasix as CT of the chest without contrast revealed bilateral pleural effusions. Oxygenation is improving. Get incentive spirometry as well.    #2. Acute bronchitis, continue antibiotic therapy. sputum cultures showed normal flora, MRSA screen by PCR was negative, blood cultures were negative, with weekly is improving, would need to finish antibiotic therapy course  #3. COPD exacerbation, as above. Continue conservative therapy, watching closely for deterioration and reintubation #4. Diabetes mellitus type 2. Continue patient on Levemir and NovoLog, sliding scale insulin. Blood glucose levels are ranging between 90 to 180, now on advanced insulin Levemir to 21 units, tapering off steroids, , watch for hypoglycemia  #5 . Acute on chronic renal failure , superimposed on renal insufficiency, chronic , stage II -III ,  Follow urinary output as well as creatinine closely, stable at present #6. Thrombocytopenia, some better , follow closely #7. Hypernatremia. Improved on D5 water solution, his continue D5 water since patient is taking orally,  follow sodium level in the morning #8, slurred speech, concerning for dysphagia. Speech therapist evaluated and felt that full  liquid diet would be appropriate at this time.  Management plans discussed with the patient, family and they are in agreement.   DRUG ALLERGIES:  Allergies  Allergen Reactions  . Penicillins Anaphylaxis and Other (See Comments)    Unable to obtain enough information to answer additional questions about this medication.      CODE STATUS:     Code Status Orders        Start     Ordered   02/11/15 1417  Full code   Continuous     02/11/15 1417      TOTAL CRITICAL CARE TIME TAKING CARE OF THIS PATIENT: 40 minutes  Discussed with speech therapist, intensivist.  Theodoro Grist M.D on 02/18/2015 at 3:07 PM  Between 7am to 6pm - Pager - [phone]  After 6pm go to [email or web address] - password [email or web address] [facility]  Office  [phone]  CC: Primary care physician; [facility]

## 2015-02-18 NOTE — Evaluation (Signed)
Occupational Therapy Evaluation Patient Details Name: [name] MRN: [id number] DOB: [birth year] Today's Date: 02/18/2015    History of Present Illness This patient is a 45 year old male who came to [facility]  secondary to progressive SOB, cough x1 week (not responsive to outpatient antibiotics); admitted with COPD exacerbation with acute hypercapnic respiratory failure.  Intubated 1/6-1/12 (self-extubated), currently on 4L supplemental O2.   Clinical Impression   This patient is a 45 year old male with the above history.  He lives with his wife and had been independent with ADL and functional mobility with recent O2 use (2 L). He now has deficits including shortness of breath, weakness, and significant drop in his activities of daily living. He would benefit from Occupational Therapy for ADL/functional mobility training and upper extremity restoration.   Follow Up Recommendations  CIR    Equipment Recommendations       Recommendations for Other Services       Precautions / Restrictions Precautions Precautions: Fall Restrictions Weight Bearing Restrictions: No      Mobility Bed Mobility      Transfers              Balance                                  ADL                                         General ADL Comments: Had been independent with basic ADL but fatigues quickly. He now needs max assist for dressing and bathing and toilet transfers. Taught patient and wife hip kit for lower body dressing to prevent leaning and becoming short of breath. Also reviewed energy saving techniques. Given written list of vendors on where to obtain hip kit.     Vision     Perception     Praxis      Pertinent Vitals/Pain Pain Assessment: No/denies pain     Hand Dominance     Extremity/Trunk Assessment Upper Extremity Assessment Upper Extremity Assessment: Generalized weakness (R shoulder flexion limited to  100o, L limited to 45o.)   Lower Extremity Assessment Lower Extremity Assessment: Defer to PT evaluation       Communication Communication Communication: No difficulties   Cognition Arousal/Alertness:  (A bit sleepy, did fall asleep for a moment.) Behavior During Therapy: WFL for tasks assessed/performed Overall Cognitive Status: Within Functional Limits for tasks assessed                     General Comments       Exercises     Shoulder Instructions      Home Living Family/patient expects to be discharged to:: Private residence Living Arrangements: Spouse/significant other Available Help at Discharge: Family Type of Home: House Home Access: Stairs to enter Technical brewer of Steps: 2 Entrance Stairs-Rails:  (no rails in front, single rail in back) Home Layout: One level                          Prior Functioning/Environment Level of Independence: Independent        Comments: Indep with household/community mobility; denies fall history.  Recent use of home O2 at 2L    OT Diagnosis: Generalized weakness;Paresis  OT Problem List: Decreased strength;Decreased range of motion;Decreased activity tolerance;Impaired balance (sitting and/or standing);Decreased knowledge of use of DME or AE;Impaired UE functional use   OT Treatment/Interventions: Self-care/ADL training;Therapeutic exercise;Energy conservation;DME and/or AE instruction    OT Goals(Current goals can be found in the care plan section) Acute Rehab OT Goals Patient Stated Goal: To get going OT Goal Formulation: With patient/family Time For Goal Achievement: 03/04/15 Potential to Achieve Goals: Good  OT Frequency: Min 1X/week   Barriers to D/C:            Co-evaluation              End of Session Equipment Utilized During Treatment:  (Hip kit)  Activity Tolerance: Patient limited by lethargy Patient left: in bed;with call bell/phone within reach;with family/visitor  present (Patient about to be transfered to 204)   Time: 0539-7673 OT Time Calculation (min): 20 min Charges:  OT General Charges $OT Visit: 1 Procedure OT Evaluation $OT Eval Low Complexity: 1 Procedure OT Treatments $Self Care/Home Management : 8-22 mins G-Codes:    Myrene Galas, MS/OTR/L  02/18/2015, 4:49 PM

## 2015-02-18 NOTE — Evaluation (Signed)
Clinical/Bedside Swallow Evaluation Patient Details  Name: [name] MRN: [id number] Date of Birth: 09-18-1970  Today's Date: 02/18/2015 Time: SLP Start Time (ACUTE ONLY): 1100 SLP Stop Time (ACUTE ONLY): 1200 SLP Time Calculation (min) (ACUTE ONLY): 60 min  Past Medical History:  Past Medical History  Diagnosis Date  . Hypertension   . Diabetes mellitus without complication ([place])   . COPD (chronic obstructive pulmonary disease) ([place])   . Hypercholesteremia unk  . Coronary artery disease   . Stroke ([place])   . Sleep apnea     does not use CPAP regularly.  . Sarcoidosis [facility])    Past Surgical History:  Past Surgical History  Procedure Laterality Date  . Appendectomy     HPI:  Pt is a 45 y.o. male with a known history of hypertension, type 2 diabetes without consultation, COPD with ongoing tobacco abuse, hyperlipidemia, history of previous CVA, sleep apnea, sarcoidosis who presented to the hospital due to shortness of breath progressively getting worse over the past week. Patient says he presented to the emergency room a few days back and was started on a prednisone taper and a Z-Pak but his symptoms have not improved. Over the past day or so he has had significant dyspnea even at rest and worse with exertion. He admits to a cough which is unproductive. He denies any fevers, chills, nausea, vomiting. He admits to some mild chest pain only when he coughs. He denies any recent sick contacts. He presented to the emergency room as his symptoms are not improving and he was noted to be in COPD exacerbation and hospitalist services were contacted further treatment and evaluation. Pt was orally intubated; self extubated yesterday. MD concerned for risk for aspiration. Pt verbally responvive w/ hoarse, raspy voical quality suspect d/t lengthy intubation. Pt was oriened to person and other biopersonal information; hospital. He followed general commands w/ min. cues. Noted slower to engage at times  w/ SLP.    Assessment / Plan / Recommendation Clinical Impression  Pt appears to adequately tolerate trials of thin liquids and purees w/ no overt, immedate s/s of aspiration noted; no changes in ANS - O2 sats and RR remained baseline w/ no decline. No changes in vocal quality noted post trials when assessed. Pt exhibited the congested cough (present at baseline) x2 during session but this did not appear related to po trials consumed. No oral phase defiicts noted w/ the trials given. Pt was able to feed self w/ support. Pt appears at reduced risk for aspiration following general aspiration precautions at this time. Rec. initiation of an easy to swallow diet w/ f/u for diet toleration and trials to upgrade tomorrow. Rec. meds in Puree for easier swallowing. Assistance at meals as nec. d/t overall weakkness. PT/OT consulted. NSG and MD consulted and agreed. Precautions posted.     Aspiration Risk   (reduced following general aspiration precautions)    Diet Recommendation  full liquids diet(thins) w/ upgrade to regular as tolerates; aspiration precautions; assist at meals as nec.  Medication Administration: Whole meds with puree    Other  Recommendations Recommended Consults:  (dietician) Oral Care Recommendations: Oral care BID;Staff/trained caregiver to provide oral care   Follow up Recommendations   (TBD)    Frequency and Duration min 2x/week  1 week       Prognosis Prognosis for Safe Diet Advancement: Good Barriers to Reach Goals:  (weakness)      Swallow Study   General Date of Onset: 02/11/15 HPI: Pt  is a 45 y.o. male with a known history of hypertension, type 2 diabetes without consultation, COPD with ongoing tobacco abuse, hyperlipidemia, history of previous CVA, sleep apnea, sarcoidosis who presented to the hospital due to shortness of breath progressively getting worse over the past week. Patient says he presented to the emergency room a few days back and was started on a  prednisone taper and a Z-Pak but his symptoms have not improved. Over the past day or so he has had significant dyspnea even at rest and worse with exertion. He admits to a cough which is unproductive. He denies any fevers, chills, nausea, vomiting. He admits to some mild chest pain only when he coughs. He denies any recent sick contacts. He presented to the emergency room as his symptoms are not improving and he was noted to be in COPD exacerbation and hospitalist services were contacted further treatment and evaluation. Pt was orally intubated; self extubated yesterday. MD concerned for risk for aspiration. Pt verbally responvive w/ hoarse, raspy voical quality suspect d/t lengthy intubation. Pt was oriened to person and other biopersonal information; hospital. He followed general commands w/ min. cues. Noted slower to engage at times w/ SLP.  Type of Study: Bedside Swallow Evaluation Previous Swallow Assessment: none Diet Prior to this Study: Regular;Thin liquids (at home; NPO w/ NG TFs during intubation) Temperature Spikes Noted: No (wbc 8.6) Respiratory Status: Nasal cannula (4 liters) History of Recent Intubation: Yes Length of Intubations (days): 7 days Date extubated: 02/17/15 Behavior/Cognition: Alert;Cooperative;Pleasant mood;Requires cueing Oral Cavity Assessment: Within Functional Limits (drank fluids this AM w/ NSg) Oral Care Completed by SLP: Recent completion by staff Oral Cavity - Dentition: Adequate natural dentition Vision: Functional for self-feeding Self-Feeding Abilities: Able to feed self;Needs assist;Needs set up (weak) Patient Positioning: Upright in bed Baseline Vocal Quality: Low vocal intensity;Hoarse (recent extubation) Volitional Cough: Strong (adequate) Volitional Swallow: Able to elicit    Oral/Motor/Sensory Function     Ice Chips Ice chips: Within functional limits Presentation: Spoon (fed; 5 trials)   Thin Liquid Thin Liquid: Within functional  limits Presentation: Cup;Straw;Self Fed (assisted; 10 trials total)    Nectar Thick Nectar Thick Liquid: Not tested   Honey Thick Honey Thick Liquid: Not tested   Puree Puree: Within functional limits Presentation: Spoon;Self Fed (assisted; 8 trials)   Solid   GO   Solid: Not tested       Orinda Kenner, MS, CCC-SLP  Watson,Katherine 02/18/2015,2:39 PM

## 2015-02-18 NOTE — Progress Notes (Signed)
Nutrition Follow-up    INTERVENTION:   Meals and Snacks: Cater to patient preferences; diet advancement as able, SLP following Medical Food Supplement Therapy: recommend addition of Ensure Enlive po BID, each supplement provides 350 kcal and 20 grams of protein   NUTRITION DIAGNOSIS:   Inadequate oral intake related to acute illness as evidenced by NPO status. Being addressed as diet being advanced post extubation, adding supplements  GOAL:   Provide needs based on ASPEN/SCCM guidelines  MONITOR:    (Energy Intake, Anthropometrics, Digestive System, Electrolyte/Renal Profile, Glucose Profile)  REASON FOR ASSESSMENT:   Ventilator    ASSESSMENT:     Pt remains extubated, on 4L [place]  Diet Order:  Diet full liquid Room service appropriate?: Yes; Fluid consistency:: Thin; SLP following  Energy Intake: pt tolerated CL diet tray well this AM  Electrolyte and Renal Profile:  Recent Labs Lab 02/16/15 0451 02/17/15 0441 02/18/15 0604  BUN 60* 45* 37*  CREATININE 1.29* 1.16 1.30*  NA 147* 148* 137  K 4.7 5.0 4.1   Glucose Profile:   Recent Labs  02/17/15 2356 02/18/15 0354 02/18/15 0745  GLUCAP 121* 93 103*   Meds: ss novolog, lasix, solumedrol  Height:   Ht Readings from Last 1 Encounters:  02/11/15 6' (1.829 m)    Weight:   Wt Readings from Last 1 Encounters:  02/18/15 285 lb 7.9 oz (129.5 kg)    BMI:  Body mass index is 38.71 kg/(m^2).  Estimated Nutritional Needs:   Kcal:  [id number] kcals using IBW 81 kg  Protein:  97-113 g (1.2-1.4 g/kg)   Fluid:  2025-2430 mL (25-30 ml/kg)     HIGH Care Level  Kerman Passey MS, RD, LDN [phone] Pager  [phone] Weekend/On-Call Pager

## 2015-02-18 NOTE — Evaluation (Signed)
Physical Therapy Evaluation Patient Details Name: [name] MRN: [id number] DOB: 07/08/1970 Today's Date: 02/18/2015   History of Present Illness  presented to ER secondary to progressive SOB, cough x1 week (not responsive to outpatient antibiotics); admitted with COPD exacerbation with acute hypercapnic respiratory failure.  Intubated 1/6-1/12 (self-extubated), currently on 4L supplemental O2.  Clinical Impression  Upon evaluation, patient alert and oriented to all information; follows all commands.  Exceptionally motivated and eager to participate/progress as able.  Patient denies pain.  Globally weak and deconditioned due to acute illness (R > L hemi-body), demonstrating 3- to 3/5 strength throughout all extremities which impairs his safety/indep with all functional activities.  Currently requiring max assist for bed mobility, but able to maintain unsupported sitting with close sup once upright.  Requiring mod assist +2 for sit/stand and single step forward/backward with RW; no overt buckling or LOB.  Additional distance/OOB efforts deferred at this time due to reports of dizziness with transition to standing (requiring return to seated/supine position for recovery); may consider checking orthostatics with continued attempts at mobilization. HR and SaO2 stable and WFL on 4L (90% with exertion) throughout session, but patient moderately fatigued (BORG 7-8/10) with above activity.  BP not assessed due to equip malfunction and need for return to supine. Clinical presentation unstable at this time due to possible symptomatic orthostasis and high risk for reintubation if respiratory status declines.  Personal factors affecting prognosis include pulmonary history (COPD with previous trach, sarcoidosis), stairs to enter home, current inability to mobilize household distances, wife works during day. Would benefit from skilled PT to address above deficits and promote optimal return to PLOF; recommend  transition to acute inpatient rehab upon discharge for high-intensity, post-acute rehab services.      Follow Up Recommendations CIR    Equipment Recommendations  Rolling walker with 5" wheels    Recommendations for Other Services       Precautions / Restrictions Precautions Precautions: Fall Restrictions Weight Bearing Restrictions: No      Mobility  Bed Mobility Overal bed mobility: Needs Assistance Bed Mobility: Supine to Sit;Sit to Supine     Supine to sit: Mod assist Sit to supine: Max assist;+2 for physical assistance      Transfers Overall transfer level: Needs assistance Equipment used: Rolling walker (2 wheeled) Transfers: Sit to/from Stand Sit to Stand: Mod assist;+2 physical assistance            Ambulation/Gait Ambulation/Gait assistance: Mod assist;+2 physical assistance Ambulation Distance (Feet): 1 Feet Assistive device: Rolling walker (2 wheeled)       General Gait Details: tolerating single step forward/backward with each LE; fair LE strength and knee control, no overt buckling noted.  Mild reports of dizziness with transition to standing, requiring return to seated/supine for recovery--may consider checking orthostatics next session if additional standing/gait attempted.  Stairs            Wheelchair Mobility    Modified Rankin (Stroke Patients Only)       Balance Overall balance assessment: Needs assistance Sitting-balance support: No upper extremity supported;Feet supported Sitting balance-Leahy Scale: Fair     Standing balance support: Bilateral upper extremity supported Standing balance-Leahy Scale: Fair                               Pertinent Vitals/Pain Pain Assessment: No/denies pain    Home Living Family/patient expects to be discharged to:: Private residence Living Arrangements: Spouse/significant other Available  Help at Discharge: Family Type of Home: House Home Access: Stairs to  enter Entrance Stairs-Rails:  (no rails in front, single rail in back) Entrance Stairs-Number of Steps: 2 Home Layout: One level        Prior Function Level of Independence: Independent         Comments: Indep with household/community mobility; denies fall history.  Recent use of home O2 at 2L     Hand Dominance        Extremity/Trunk Assessment   Upper Extremity Assessment: Generalized weakness (R UE grossly 3/5, L UE 3-/5; globally weak and deconditioned)           Lower Extremity Assessment: Generalized weakness (R LE grossly 3/5, L LE grossly 3-/5; globally weak and deconditioned)         Communication   Communication: No difficulties  Cognition Arousal/Alertness: Awake/alert Behavior During Therapy: WFL for tasks assessed/performed Overall Cognitive Status: Within Functional Limits for tasks assessed                      General Comments      Exercises Other Exercises Other Exercises: Supine LE therex, 1x10, AROM for muscular strength/endurance: ankle pumps, quad sets, hip abduct/adduct, heel slides with light manual resistance (in extension). Patient very motivated to participate/progress as able      Assessment/Plan    PT Assessment Patient needs continued PT services  PT Diagnosis Difficulty walking;Generalized weakness   PT Problem List Decreased strength;Decreased activity tolerance;Decreased balance;Decreased mobility;Decreased knowledge of use of DME;Decreased safety awareness;Decreased knowledge of precautions;Cardiopulmonary status limiting activity  PT Treatment Interventions DME instruction;Gait training;Stair training;Functional mobility training;Therapeutic activities;Therapeutic exercise;Patient/family education;Balance training   PT Goals (Current goals can be found in the Care Plan section) Acute Rehab PT Goals Patient Stated Goal: "I'm determined to get back on my feet" PT Goal Formulation: With patient/family Time For Goal  Achievement: 03/04/15 Potential to Achieve Goals: Good    Frequency 7X/week   Barriers to discharge Inaccessible home environment;Decreased caregiver support      Co-evaluation               End of Session Equipment Utilized During Treatment: Gait belt Activity Tolerance: Patient tolerated treatment well Patient left: in bed;with call bell/phone within reach;with family/visitor present Nurse Communication: Mobility status (performance during session)         Time: 1455-1530 PT Time Calculation (min) (ACUTE ONLY): 35 min   Charges:   PT Evaluation $PT Eval High Complexity: 1 Procedure PT Treatments $Therapeutic Exercise: 8-22 mins   PT G Codes:        Richy Spradley H. Owens Shark, PT, DPT, NCS 02/18/2015, 4:13 PM [phone]

## 2015-02-19 LAB — GLUCOSE, CAPILLARY
GLUCOSE-CAPILLARY: 116 mg/dL — AB (ref 65–99)
GLUCOSE-CAPILLARY: 151 mg/dL — AB (ref 65–99)
GLUCOSE-CAPILLARY: 158 mg/dL — AB (ref 65–99)
Glucose-Capillary: 116 mg/dL — ABNORMAL HIGH (ref 65–99)
Glucose-Capillary: 214 mg/dL — ABNORMAL HIGH (ref 65–99)
Glucose-Capillary: 220 mg/dL — ABNORMAL HIGH (ref 65–99)

## 2015-02-19 LAB — BASIC METABOLIC PANEL
ANION GAP: 5 (ref 5–15)
BUN: 33 mg/dL — AB (ref 6–20)
CHLORIDE: 98 mmol/L — AB (ref 101–111)
CO2: 37 mmol/L — ABNORMAL HIGH (ref 22–32)
Calcium: 8.5 mg/dL — ABNORMAL LOW (ref 8.9–10.3)
Creatinine, Ser: 1.19 mg/dL (ref 0.61–1.24)
GFR calc Af Amer: >60 mL/min (ref >60)
GFR calc non Af Amer: >60 mL/min (ref >60)
Glucose, Bld: 145 mg/dL — ABNORMAL HIGH (ref 65–99)
POTASSIUM: 4.1 mmol/L (ref 3.5–5.1)
Sodium: 140 mmol/L (ref 135–145)

## 2015-02-19 NOTE — Progress Notes (Signed)
Occupational Therapy Treatment Patient Details Name: [name] MRN: [id number] DOB: Apr 30, 1970 Today's Date: 02/19/2015    History of present illness presented to ER secondary to progressive SOB, cough x1 week (not responsive to outpatient antibiotics); admitted with COPD exacerbation with acute hypercapnic respiratory failure.  Intubated 1/6-1/12 (self-extubated), currently on 4L supplemental O2.   OT comments  Patient feels he no longer needs hip kit for lower body dressing. Did review additional energy saving techniques to prevent exacerbation.  Follow Up Recommendations  CIR    Equipment Recommendations       Recommendations for Other Services  (Patient would benefit from daily OT)    Precautions / Restrictions Precautions Precautions: Fall Restrictions Weight Bearing Restrictions: No       Mobility Bed Mobility  Transfers            Balance                       ADL                                         General ADL Comments: Patient feeling better today and declined trying hip kit to dress lower body as he feels better and does not need hip kit. Did review more energy saving techniqes during ADL to prevent anouther axaserbation.      Vision                     Perception     Praxis      Cognition   Behavior During Therapy: WFL for tasks assessed/performed Overall Cognitive Status: Within Functional Limits for tasks assessed                       Extremity/Trunk Assessment               Exercises    Shoulder Instructions       General Comments      Pertinent Vitals/ Pain       Pain Assessment: No/denies pain  Home Living                                          Prior Functioning/Environment              Frequency       Progress Toward Goals  OT Goals(current goals can now be found in the care plan section)        Plan      Co-evaluation                 End of Session     Activity Tolerance     Patient Left     Nurse Communication          Time: 2992-4268 OT Time Calculation (min): 10 min  Charges: OT General Charges $OT Visit: 1 Procedure OT Treatments $Self Care/Home Management : 8-22 mins Sharon Mt, MS/OTR/L  Sharon Mt 02/19/2015, 1:24 PM

## 2015-02-19 NOTE — Progress Notes (Signed)
[facility] [place] NAME: [name]    MR#:  [id number]  DATE OF BIRTH:  10/15/1970  SUBJECTIVE:  CHIEF COMPLAINT:   Chief Complaint  Patient presents with  . Shortness of Breath   patient is 45 year old African-American male with history of hypertension, COPD, diabetes, coronary artery disease, hyperlipidemia who presented to the hospital with complaints of cough, shortness of breath worsening over a period of time and not improving with antibiotic therapy. Patient is admitted to the hospital and eventually intubated due to worsening shortness of breath. Self extubated January 12.  Initially was placed on BiPAP. However, now on 1.5 L of oxygen through nasal cannula, BIPAP at night,  denies any shortness of breath .  CT scan of the chest revealed bilateral pleural effusions, atelectasis, moderate emphysema.  High risk for reintubation. Has ?CPAP vs BIPAP at home, did not initiated yet.     . Review of Systems  Unable to perform ROS: intubated    VITAL SIGNS: Blood pressure 135/89, pulse 76, temperature 97.4 F (36.3 C), temperature source Oral, resp. rate 17, height 6' (1.829 m), weight 128.141 kg (282 lb 8 oz), SpO2 100 %.  PHYSICAL EXAMINATION:   GENERAL:  45 y.o.-year-old patient lying in the bed with no acute distress. On oxygen through nasal cannula. Speech is more clear today  EYES: Pupils equal, round, reactive to light and accommodation. No scleral icterus. Extraocular muscles intact.  HEENT: Head atraumatic, normocephalic. Oropharynx and nasopharynx clear.  NECK:  Supple, no jugular venous distention. No thyroid enlargement, no tenderness.  LUNGS: better air entrance b/l, audible breath sounds bilaterally,  Intermittent use of accessory muscles of respiration, especially with exertion, speech. Marland Kitchen  CARDIOVASCULAR: S1, S2 normal. No murmurs, rubs, or gallops.  ABDOMEN: Soft, nontender, nondistended. Bowel sounds present. No  organomegaly or mass.  EXTREMITIES: No pedal edema, cyanosis, or clubbing.  NEUROLOGIC: Cranial nerves II through XII are intact. Muscle strength 5/5 in all extremities. Sensation intact. Gait not checked.  PSYCHIATRIC: The patient is more alert , able to answer questions and follows commands SKIN: No obvious rash, lesion, or ulcer.   ORDERS/RESULTS REVIEWED:   CBC  Recent Labs Lab 02/13/15 0402 02/14/15 0357 02/15/15 0604 02/16/15 0451 02/17/15 0441  WBC 13.5* 18.4* 19.8* 8.6  --   HGB 15.9 17.3 17.9 16.0  --   HCT 49.7 55.1* 58.6* 51.9  --   PLT 167 187 205 128* 144*  MCV 82.1 84.2 85.7 85.4  --   MCH 26.2 26.4 26.1 26.3  --   MCHC 31.9* 31.4* 30.5* 30.8*  --   RDW 15.4* 15.6* 15.8* 15.6*  --    ------------------------------------------------------------------------------------------------------------------  Chemistries   Recent Labs Lab 02/15/15 0604 02/15/15 1439 02/16/15 0451 02/17/15 0441 02/18/15 0604 02/19/15 0552  NA 144  --  147* 148* 137 140  K 5.6* 4.9 4.7 5.0 4.1 4.1  CL 100*  --  106 108 97* 98*  CO2 37*  --  37* 42* 35* 37*  GLUCOSE 160*  --  197* 174* 300* 145*  BUN 51*  --  60* 45* 37* 33*  CREATININE 1.52*  --  1.29* 1.16 1.30* 1.19  CALCIUM 8.0*  --  7.9* 8.3* 7.9* 8.5*   ------------------------------------------------------------------------------------------------------------------ estimated creatinine clearance is 109.6 mL/min (by C-G formula based on Cr of 1.19). ------------------------------------------------------------------------------------------------------------------ No results for input(s): TSH, T4TOTAL, T3FREE, THYROIDAB in the last 72 hours.  Invalid input(s): FREET3  Cardiac Enzymes No  results for input(s): CKMB, TROPONINI, MYOGLOBIN in the last 168 hours.  Invalid input(s): CK ------------------------------------------------------------------------------------------------------------------ Invalid input(s):  POCBNP ---------------------------------------------------------------------------------------------------------------  RADIOLOGY: No results found.  EKG:  Orders placed or performed during the hospital encounter of 02/11/15  . ED EKG  . ED EKG  . EKG 12-Lead  . EKG 12-Lead    ASSESSMENT AND PLAN:  Active Problems:   Acute respiratory failure with hypoxia and hypercarbia (HCC)   COPD exacerbation (HCC)   Dyspnea  #1. Acute on chronic respiratory failure with hypoxia and hypercapnia due to COPD exacerbation, status post intubation January 6 and self extubation January 12 , continue patient on oxygen per nasal cannulas, BIPAP at night prn, continue duo nebs, Pulmicort, levofloxacin, steroid injections,  appreciate pulmonologist input. Off Lasix due to renal insufficiency, which is chronic, may restart lasix as CT of the chest without contrast revealed bilateral pleural effusions. Oxygenation is improving. On incentive spirometry as well.    #2. Acute bronchitis, continue antibiotic therapy. sputum cultures showed normal flora, MRSA screen by PCR was negative, blood cultures were negative,will need to finish antibiotic therapy course  #3. COPD exacerbation, as above. Continue conservative therapy, watching closely for deterioration and reintubation #4. Diabetes mellitus type 2. Continue patient on Levemir and NovoLog, sliding scale insulin. Blood glucose levels are ranging between 90 to 215, continue insulin Levemir at 21 units, tapering off steroids, , watch for hypoglycemia  #5 . Acute on chronic renal failure , superimposed on renal insufficiency, chronic , stage II -III ,  Follow urinary output as well as creatinine closely, stable at present #6. Thrombocytopenia, better , follow  #7. Hypernatremia. Improved on D5 water solution, off  D5 water since patient is taking orally,  follow sodium level in the morning #8, slurred speech, concerning for dysphagia. Speech therapist evaluated and  regular  diet is started   Management plans discussed with the patient, family and they are in agreement.   DRUG ALLERGIES:  Allergies  Allergen Reactions  . Penicillins Anaphylaxis and Other (See Comments)    Unable to obtain enough information to answer additional questions about this medication.      CODE STATUS:     Code Status Orders        Start     Ordered   02/11/15 1417  Full code   Continuous     02/11/15 1417      TOTAL CRITICAL CARE TIME TAKING CARE OF THIS PATIENT: 40 minutes  Discussed with speech therapist today  Shalin Vonbargen M.D on 02/19/2015 at 2:43 PM  Between 7am to 6pm - Pager - [phone]  After 6pm go to [email or web address] - password [email or web address] [facility]  Office  [phone]  CC: Primary care physician; [facility]

## 2015-02-19 NOTE — Progress Notes (Signed)
Physical Therapy Treatment Patient Details Name: [name] MRN: [id number] DOB: January 31, 1971 Today's Date: 02/19/2015    History of Present Illness presented to ER secondary to progressive SOB, cough x1 week (not responsive to outpatient antibiotics); admitted with COPD exacerbation with acute hypercapnic respiratory failure.  Intubated 1/6-1/12 (self-extubated), currently on 4L supplemental O2.    PT Comments    Pt received sitting up at EOB this session and eager to get up and walk.  Pt very motivated to increase strength and endurance this session and demonstrated good progress towards goals as noted by decreased assistance with bed mobility, transfer, and gait.  Pt able to ambulate 210' with BRW on 2 L O2 with sats above 93%.  Recommend pt to ambulate 3x/day with assist, RN notified.  Pt has no complaints of dizziness this session but continues with non-productive cough.  Continue with current POC.  Follow Up Recommendations        Equipment Recommendations       Recommendations for Other Services       Precautions / Restrictions Precautions Precautions: Fall Restrictions Weight Bearing Restrictions: No    Mobility  Bed Mobility Overal bed mobility: Modified Independent Bed Mobility: Supine to Sit;Sit to Supine     Supine to sit: Modified independent (Device/Increase time) Sit to supine: Modified independent (Device/Increase time)   General bed mobility comments: HOB elevated  Transfers Overall transfer level: Needs assistance Equipment used: Rolling walker (2 wheeled) Transfers: Sit to/from Stand Sit to Stand: Min guard         General transfer comment: B UE's on RW, pushing straight down to assist with standing  Ambulation/Gait Ambulation/Gait assistance: Min guard Ambulation Distance (Feet): 210 Feet Assistive device: Rolling walker (2 wheeled) Gait Pattern/deviations: Decreased step length - right;Decreased step length - left Gait velocity:  decreased, with standing rest break x 3   General Gait Details: Amb around nursing station for 210' with RW and CGA on 2 LO2 with O2 sat monitoring throughout.  Sats at or above 93%.  Decreased control of termnal knee extension in stance bilaterally   Stairs            Wheelchair Mobility    Modified Rankin (Stroke Patients Only)       Balance Overall balance assessment: Modified Independent Sitting-balance support: Feet supported Sitting balance-Leahy Scale: Normal     Standing balance support: Bilateral upper extremity supported Standing balance-Leahy Scale: Good                      Cognition Arousal/Alertness: Awake/alert Behavior During Therapy: WFL for tasks assessed/performed Overall Cognitive Status: Within Functional Limits for tasks assessed                      Exercises Other Exercises Other Exercises: Sitting balance EOB x 5 min; sit<>stand x 5 reps for LE strengthening; ambulation for LE strengthening    General Comments        Pertinent Vitals/Pain Pain Assessment: No/denies pain    Home Living                      Prior Function            PT Goals (current goals can now be found in the care plan section) Acute Rehab PT Goals PT Goal Formulation: With patient/family Time For Goal Achievement: 03/04/15 Potential to Achieve Goals: Good    Frequency  7X/week    PT Plan  Current plan remains appropriate    Co-evaluation             End of Session Equipment Utilized During Treatment: Gait belt Activity Tolerance: Patient tolerated treatment well Patient left: in bed;with call bell/phone within reach     Time: 1155-1219 PT Time Calculation (min) (ACUTE ONLY): 24 min  Charges:  $Gait Training: 8-22 mins $Therapeutic Exercise: 8-22 mins                    G Codes:      [name] A [name] 2015/02/22, 12:32 PM

## 2015-02-19 NOTE — Progress Notes (Signed)
Speech Language Pathology Treatment: Dysphagia  Patient Details Name: MIKKO LEWELLEN MRN: [id number] DOB: 08-17-1970 Today's Date: 02/19/2015 Time: 3662-9476 SLP Time Calculation (min) (ACUTE ONLY): 35 min  Assessment / Plan / Recommendation Clinical Impression  Met w/ pt this morning for trials to upgrade diet. Pt was more alert today than at eval; talkative and more articulate and doing tasks for himself(including sitting EOB and talking on phone). Pt appears at his baseline w/ regard to toleration of a regular diet consistency. Pt consumed po trials of solids w/ no overt s/s of aspiration and adequate oral phase management of boluses. Pt alternated foods w/ sips of liquids via straw w/ no deficits noted; no complaints by pt. Pt stated he was "fine" and that he was "not coming back to a hospital again". Thoroughly discussed general aspiration precautions w/ pt to include sitting upright like he was doing on EOB when eating/drinking; eat/drink slowly using small bites/sips. Pt appeared able feed self w/ no setup assist indicated. MD agreed w/ diet upgrade. No further skilled ST services indicated at this time. NSG to reconsult if any change in status. NSG updated.   HPI HPI: Pt is a 45 y.o. male with a known history of hypertension, type 2 diabetes without consultation, COPD with ongoing tobacco abuse, hyperlipidemia, history of previous CVA, sleep apnea, sarcoidosis who presented to the hospital due to shortness of breath progressively getting worse over the past week. Patient says he presented to the emergency room a few days back and was started on a prednisone taper and a Z-Pak but his symptoms have not improved. Over the past day or so he has had significant dyspnea even at rest and worse with exertion. He admits to a cough which is unproductive. He denies any fevers, chills, nausea, vomiting. He admits to some mild chest pain only when he coughs. He denies any recent sick contacts. He presented to  the emergency room as his symptoms are not improving and he was noted to be in COPD exacerbation and hospitalist services were contacted further treatment and evaluation. Pt was orally intubated; self extubated yesterday. MD concerned for risk for aspiration. Pt verbally responvive w/ hoarse, raspy voical quality suspect d/t lengthy intubation. Pt was oriented x3 talking on phone w/ family when in room. Pt stated he was ready for "eating food". He appeared more alert than yesterday. C/o slight numbness in tongue tip; MD aware.      SLP Plan  Discharge SLP treatment due to (comment) (goals met)     Recommendations  Diet recommendations: Regular;Thin liquid Liquids provided via: Cup;Straw Medication Administration: Whole meds with liquid Supervision: Patient able to self feed Compensations: Slow rate;Small sips/bites Postural Changes and/or Swallow Maneuvers: Seated upright 90 degrees             Oral Care Recommendations: Oral care BID;Patient independent with oral care Follow up Recommendations: None Plan: Discharge SLP treatment due to (comment) (goals met)     [place], MS, CCC-SLP  [place] 02/19/2015, 10:26 AM

## 2015-02-19 NOTE — Progress Notes (Signed)
Physical Therapy Treatment Patient Details Name: [name] MRN: [id number] DOB: Mar 06, 1970 Today's Date: 02/19/2015    History of Present Illness presented to ER secondary to progressive SOB, cough x1 week (not responsive to outpatient antibiotics); admitted with COPD exacerbation with acute hypercapnic respiratory failure.  Intubated 1/6-1/12 (self-extubated), currently on 4L supplemental O2.    PT Comments    Pt received sitting up at EOB this session and eager to get up and walk.  Pt very motivated to increase strength and endurance this session and demonstrated good progress towards goals as noted by decreased assistance with bed mobility, transfer, and gait.  Pt able to ambulate 210' with BRW on 2 L O2 with sats above 93%.  Recommend pt to ambulate 3x/day with assist, RN notified.  Pt has no complaints of dizziness this session but continues with non-productive cough.  Continue with current POC.   Follow Up Recommendations  CIR     Equipment Recommendations  Rolling walker with 5" wheels    Recommendations for Other Services       Precautions / Restrictions Precautions Precautions: Fall Restrictions Weight Bearing Restrictions: No    Mobility  Bed Mobility Overal bed mobility: Modified Independent Bed Mobility: Supine to Sit;Sit to Supine     Supine to sit: Modified independent (Device/Increase time) Sit to supine: Modified independent (Device/Increase time)   General bed mobility comments: HOB elevated  Transfers Overall transfer level: Needs assistance Equipment used: Rolling walker (2 wheeled) Transfers: Sit to/from Stand Sit to Stand: Min guard         General transfer comment: B UE's on RW, pushing straight down to assist with standing  Ambulation/Gait Ambulation/Gait assistance: Min guard Ambulation Distance (Feet): 210 Feet Assistive device: Rolling walker (2 wheeled) Gait Pattern/deviations: Decreased step length - right;Decreased step length  - left Gait velocity: decreased, with standing rest break x 3   General Gait Details: Amb around nursing station for 210' with RW and CGA on 2 LO2 with O2 sat monitoring throughout.  Sats at or above 93%.  Decreased control of termnal knee extension in stance bilaterally   Stairs            Wheelchair Mobility    Modified Rankin (Stroke Patients Only)       Balance Overall balance assessment: Modified Independent Sitting-balance support: Feet supported Sitting balance-Leahy Scale: Normal     Standing balance support: Bilateral upper extremity supported Standing balance-Leahy Scale: Good                      Cognition Arousal/Alertness: Awake/alert Behavior During Therapy: WFL for tasks assessed/performed Overall Cognitive Status: Within Functional Limits for tasks assessed                      Exercises Other Exercises Other Exercises: Sitting balance EOB x 5 min; sit<>stand x 5 reps for LE strengthening; ambulation for LE strengthening    General Comments        Pertinent Vitals/Pain Pain Assessment: No/denies pain    Home Living                      Prior Function            PT Goals (current goals can now be found in the care plan section) Acute Rehab PT Goals PT Goal Formulation: With patient/family Time For Goal Achievement: 03/04/15 Potential to Achieve Goals: Good    Frequency  7X/week  PT Plan Current plan remains appropriate    Co-evaluation             End of Session Equipment Utilized During Treatment: Gait belt Activity Tolerance: Patient tolerated treatment well Patient left: in bed;with call bell/phone within reach     Time: 1155-1219 PT Time Calculation (min) (ACUTE ONLY): 24 min  Charges:  $Gait Training: 8-22 mins $Therapeutic Exercise: 8-22 mins                    G Codes:      Tazaria Dlugosz A Amaree Leeper, PT Feb 26, 2015, 12:37 PM

## 2015-02-20 LAB — GLUCOSE, CAPILLARY
GLUCOSE-CAPILLARY: 175 mg/dL — AB (ref 65–99)
GLUCOSE-CAPILLARY: 250 mg/dL — AB (ref 65–99)
Glucose-Capillary: 208 mg/dL — ABNORMAL HIGH (ref 65–99)
Glucose-Capillary: 240 mg/dL — ABNORMAL HIGH (ref 65–99)

## 2015-02-20 MED ORDER — IPRATROPIUM-ALBUTEROL 0.5-2.5 (3) MG/3ML IN SOLN
3.0000 mL | Freq: Four times a day (QID) | RESPIRATORY_TRACT | Status: DC
  Administered 2015-02-21 (×2): 3 mL via RESPIRATORY_TRACT
  Filled 2015-02-20 (×2): qty 3

## 2015-02-20 NOTE — Progress Notes (Signed)
[name] at [place] NAME: [name]    MR#:  [id number]  DATE OF BIRTH:  10-02-1970  SUBJECTIVE:  CHIEF COMPLAINT:   Chief Complaint  Patient presents with  . Shortness of Breath   patient is 45 year old African-American male with history of hypertension, COPD, diabetes, coronary artery disease, hyperlipidemia who presented to the hospital with complaints of cough, shortness of breath worsening over a period of time and not improving with antibiotic therapy. Patient is admitted to the hospital and eventually intubated due to worsening shortness of breath. Self extubated January 12.  Initially was placed on BiPAP. However, now on 1.5 L of oxygen through nasal cannula, BIPAP at night,  denies any shortness of breath .  CT scan of the chest revealed bilateral pleural effusions, atelectasis, moderate emphysema.  Feels good today. Denies any significant cough or sputum production or shortness of breath. Physical therapist recommends home health physical therapy , patient is agreeable to go home tomorrow . Patient claims he was going to use BiPAP at night    . Review of Systems  Unable to perform ROS: intubated    VITAL SIGNS: Blood pressure 127/70, pulse 74, temperature 98.7 F (37.1 C), temperature source Oral, resp. rate 17, height 6' (1.829 m), weight 129.502 kg (285 lb 8 oz), SpO2 95 %.  PHYSICAL EXAMINATION:   GENERAL:  45 y.o.-year-old patient lying in the bed with no acute distress. On oxygen through nasal cannula. Speech is more clear today  EYES: Pupils equal, round, reactive to light and accommodation. No scleral icterus. Extraocular muscles intact.  HEENT: Head atraumatic, normocephalic. Oropharynx and nasopharynx clear.  NECK:  Supple, no jugular venous distention. No thyroid enlargement, no tenderness.  LUNGS: better air entrance b/l, audible breath sounds bilaterally,  no use of accessory muscles of respiration, even with  with exertion, speech. Using BiPAP at night.  CARDIOVASCULAR: S1, S2 normal. No murmurs, rubs, or gallops.  ABDOMEN: Soft, nontender, nondistended. Bowel sounds present. No organomegaly or mass.  EXTREMITIES: No pedal edema, cyanosis, or clubbing. Lower extremity swelling has been subsiding NEUROLOGIC: Cranial nerves II through XII are intact. Muscle strength 5/5 in all extremities. Sensation intact. Gait not checked.  PSYCHIATRIC: The patient is more alert , able to answer questions and follows commands SKIN: No obvious rash, lesion, or ulcer.   ORDERS/RESULTS REVIEWED:   CBC  Recent Labs Lab 02/14/15 0357 02/15/15 0604 02/16/15 0451 02/17/15 0441  WBC 18.4* 19.8* 8.6  --   HGB 17.3 17.9 16.0  --   HCT 55.1* 58.6* 51.9  --   PLT 187 205 128* 144*  MCV 84.2 85.7 85.4  --   MCH 26.4 26.1 26.3  --   MCHC 31.4* 30.5* 30.8*  --   RDW 15.6* 15.8* 15.6*  --    ------------------------------------------------------------------------------------------------------------------  Chemistries   Recent Labs Lab 02/15/15 0604 02/15/15 1439 02/16/15 0451 02/17/15 0441 02/18/15 0604 02/19/15 0552  NA 144  --  147* 148* 137 140  K 5.6* 4.9 4.7 5.0 4.1 4.1  CL 100*  --  106 108 97* 98*  CO2 37*  --  37* 42* 35* 37*  GLUCOSE 160*  --  197* 174* 300* 145*  BUN 51*  --  60* 45* 37* 33*  CREATININE 1.52*  --  1.29* 1.16 1.30* 1.19  CALCIUM 8.0*  --  7.9* 8.3* 7.9* 8.5*   ------------------------------------------------------------------------------------------------------------------ estimated creatinine clearance is 110.3 mL/min (by C-G formula based on Cr  of 1.19). ------------------------------------------------------------------------------------------------------------------ No results for input(s): TSH, T4TOTAL, T3FREE, THYROIDAB in the last 72 hours.  Invalid input(s): FREET3  Cardiac Enzymes No results for input(s): CKMB, TROPONINI, MYOGLOBIN in the last 168 hours.  Invalid  input(s): CK ------------------------------------------------------------------------------------------------------------------ Invalid input(s): POCBNP ---------------------------------------------------------------------------------------------------------------  RADIOLOGY: No results found.  EKG:  Orders placed or performed during the hospital encounter of 02/11/15  . ED EKG  . ED EKG  . EKG 12-Lead  . EKG 12-Lead    ASSESSMENT AND PLAN:  Active Problems:   Acute respiratory failure with hypoxia and hypercarbia (HCC)   COPD exacerbation (HCC)   Dyspnea  #1. Acute on chronic respiratory failure with hypoxia and hypercapnia due to COPD exacerbation, status post intubation January 6 and self extubation January 12 , continue patient on oxygen per nasal cannulas, BIPAP at night , now on 1 L of oxygen through nasal cannula oxygenation is satisfactory,, continue duo nebs, Pulmicort, levofloxacin, prednisone orally ,  appreciate pulmonologist input. Off Lasix due to renal insufficiency, which is chronic, may restart lasix as outpatient as CT of the chest without contrast revealed bilateral pleural effusions. Oxygenation is improving. On incentive spirometry as well.    #2. Acute bronchitis, continue antibiotic therapy. sputum cultures showed normal flora, MRSA screen by PCR was negative, blood cultures were negative,will need to finish antibiotic therapy course  #3. COPD exacerbation, as above. Continue conservative therapy, watching closely for deterioration and reintubation #4. Diabetes mellitus type 2. Of  Levemir and continue NovoLog, sliding scale insulin. Blood glucose levels are ranging between 120  to 215, blood continue insulin Levemir at 21 units, tapering off steroids, , watch for hypoglycemia  #5 . Acute on chronic renal failure , superimposed on renal insufficiency, chronic , stage II -III ,  Follow urinary output as well as creatinine closely, stable at present #6.  Thrombocytopenia,  follow  #7. Hypernatremia. Improved on D5 water solution, off  D5 water since patient is taking orally,  follow sodium level in the morning #8, slurred speech, concerning for dysphagia. Speech therapist evaluated and regular  diet is started   Management plans discussed with the patient, family and they are in agreement.   DRUG ALLERGIES:  Allergies  Allergen Reactions  . Penicillins Anaphylaxis and Other (See Comments)    Unable to obtain enough information to answer additional questions about this medication.      CODE STATUS:     Code Status Orders        Start     Ordered   02/11/15 1417  Full code   Continuous     02/11/15 1417      TOTAL CRITICAL CARE TIME TAKING CARE OF THIS PATIENT: 40 minutes  Discussed with pulmonary, social worker today  Celicia Minahan M.D on 02/20/2015 at 3:04 PM  Between 7am to 6pm - Pager - [phone]  After 6pm go to [email or web address] - password [email or web address] [facility]  Office  [phone]  CC: Primary care physician; [facility]

## 2015-02-20 NOTE — Progress Notes (Signed)
Physical Therapy Treatment Patient Details Name: CAMDYN BLOEMKER MRN: [id number] DOB: 28-Oct-1970 Today's Date: 02/20/2015    History of Present Illness 45 y/o male presented to ER secondary to progressive SOB, cough x1 week (not responsive to outpatient antibiotics); admitted with COPD exacerbation with acute hypercapnic respiratory failure.  Intubated 1/6-1/12 (self-extubated).   PT Comments    Pt continues to make very good gains and is able to ambulate around the nurses' station w/o AD and negotiate up/down steps w/o safety issues today.  He is on 2 liters t/o the session and his sats drop to the mid 80s during prolonged ambulation, but he does not feel overly fatigued and generally reports feeling stronger and more confident with all physical acts.   Follow Up Recommendations  Home health PT     Equipment Recommendations  None recommended by PT    Recommendations for Other Services       Precautions / Restrictions Precautions Precautions: Fall Restrictions Weight Bearing Restrictions: No    Mobility  Bed Mobility               General bed mobility comments: pt in recliner on arrival, not tested  Transfers Overall transfer level: Independent Equipment used: None Transfers: Sit to/from Stand Sit to Stand: Independent         General transfer comment: Pt able to rise w/o issue showing good confidence and safety  Ambulation/Gait Ambulation/Gait assistance: Min guard Ambulation Distance (Feet): 260 Feet Assistive device: None       General Gait Details: Pt able to ambulate with consistent cadence (he reports being a little slower than baseline) but overall he is safe and has no safety issues.  He does have some fatigue and O2 drops to mid 80s on 2 liters but he does not need any rest breaks   Stairs Stairs: Yes Stairs assistance: Supervision Stair Management: No rails Number of Stairs: 3 General stair comments: Pt slow and cautious on steps, but able to  use step-to strategy to safely negotiate.  Wheelchair Mobility    Modified Rankin (Stroke Patients Only)       Balance     Sitting balance-Leahy Scale: Normal       Standing balance-Leahy Scale: Good                      Cognition Arousal/Alertness: Awake/alert Behavior During Therapy: WFL for tasks assessed/performed Overall Cognitive Status: Within Functional Limits for tasks assessed                      Exercises      General Comments        Pertinent Vitals/Pain Pain Assessment: No/denies pain    Home Living                      Prior Function            PT Goals (current goals can now be found in the care plan section) Progress towards PT goals: Progressing toward goals    Frequency  Min 2X/week    PT Plan Frequency needs to be updated;Current plan remains appropriate    Co-evaluation             End of Session Equipment Utilized During Treatment: Gait belt Activity Tolerance: Patient tolerated treatment well Patient left: in chair;with call bell/phone within reach     Time: 1110-1128 PT Time Calculation (min) (ACUTE ONLY): 18 min  Charges:  $  Gait Training: 8-22 mins                    G Codes:     Wayne Both, [place], DPT [phone]  Kreg Shropshire 02/20/2015, 12:36 PM

## 2015-02-21 ENCOUNTER — Ambulatory Visit: Payer: Medicare Other | Admitting: Family

## 2015-02-21 DIAGNOSIS — J209 Acute bronchitis, unspecified: Secondary | ICD-10-CM

## 2015-02-21 DIAGNOSIS — E870 Hyperosmolality and hypernatremia: Secondary | ICD-10-CM

## 2015-02-21 DIAGNOSIS — R4781 Slurred speech: Secondary | ICD-10-CM

## 2015-02-21 DIAGNOSIS — J449 Chronic obstructive pulmonary disease, unspecified: Secondary | ICD-10-CM

## 2015-02-21 DIAGNOSIS — D696 Thrombocytopenia, unspecified: Secondary | ICD-10-CM

## 2015-02-21 DIAGNOSIS — I5033 Acute on chronic diastolic (congestive) heart failure: Secondary | ICD-10-CM

## 2015-02-21 DIAGNOSIS — G4733 Obstructive sleep apnea (adult) (pediatric): Secondary | ICD-10-CM

## 2015-02-21 LAB — GLUCOSE, CAPILLARY
GLUCOSE-CAPILLARY: 145 mg/dL — AB (ref 65–99)
Glucose-Capillary: 231 mg/dL — ABNORMAL HIGH (ref 65–99)

## 2015-02-21 MED ORDER — PREDNISONE 10 MG (21) PO TBPK: 10.0000 mg | ORAL_TABLET | Freq: Every day | ORAL | Status: DC

## 2015-02-21 MED ORDER — IPRATROPIUM-ALBUTEROL 0.5-2.5 (3) MG/3ML IN SOLN: 3.0000 mL | Freq: Four times a day (QID) | RESPIRATORY_TRACT | Status: DC

## 2015-02-21 NOTE — Care Management Important Message (Signed)
Important Message  Patient Details  Name: [name] MRN: [id number] Date of Birth: 07-Dec-1970   Medicare Important Message Given:  Yes    Juliann Pulse A Alyson Ki 02/21/2015, 10:53 AM

## 2015-02-21 NOTE — Care Management Note (Signed)
Case Management Note  Patient Details  Name: [name] MRN: [id number] Date of Birth: [birth year]  Subjective/Objective:                 Patient admitted from home with COPD.  Patient lives at home with his wife.  Patient PCP Dr. Gwynneth Aliment.  Patient obtains his medications from [facility] on [address]  Patient is on chronic home O2 which is provided by [facility].  Patient does not have a portable tank at bedside.  He has declined for me to have one delivered to the room.  Patient states that he utilizes BIPAP at home during the night.  PT recommending HHPT.  [facility].  Jason from [facility] notified,  Wife to pick up patient at time of discharge.  Patient has follow up appointment at the heart failure clinic 03/07/14   Action/Plan:   Expected Discharge Date:  02/18/15               Expected Discharge Plan:     In-House Referral:     Discharge planning Services     Post Acute Care Choice:    Choice offered to:     DME Arranged:    DME Agency:     HH Arranged:    [place] Agency:     Status of Service:     Medicare Important Message Given:  Yes Date Medicare IM Given:    Medicare IM give by:    Date Additional Medicare IM Given:    Additional Medicare Important Message give by:     If discussed at [place] of Stay Meetings, dates discussed:    Additional Comments:  Beverly Sessions, RN 02/21/2015, 1:02 PM

## 2015-02-21 NOTE — Progress Notes (Signed)
         To Whom It May Concern     Mr. Denard, Mehl was hospitalized at [facility] with life-threatening disease from 02/11/2015 through 02/21/2015. He is to return back to work at full capacity 03/01/2015. Thank you for your understanding.   Sincerely,    Theodoro Grist, MD

## 2015-02-21 NOTE — Discharge Summary (Signed)
[facility] NAME: [name]    MR#:  [id number]  DATE OF BIRTH:  April 11, 1970  DATE OF ADMISSION:  02/11/2015 ADMITTING PHYSICIAN: Henreitta Leber, MD  DATE OF DISCHARGE: 02/21/2015  3:15 PM  PRIMARY CARE PHYSICIAN: SOLES, MEREDITH KEY, MD     ADMISSION DIAGNOSIS:  Hypoxia [R09.02] COPD exacerbation ([place]) [J44.1]  DISCHARGE DIAGNOSIS:  Principal Problem:   Acute respiratory failure with hypoxia and hypercarbia (HCC) Active Problems:   COPD exacerbation (HCC)   Dyspnea   Diastolic CHF, acute on chronic (HCC)   Morbid obesity (HCC)   Obstructive sleep apnea   Acute bronchitis   Thrombocytopenia (HCC)   Hypernatremia   Slurred speech   SECONDARY DIAGNOSIS:   Past Medical History  Diagnosis Date  . Hypertension   . Diabetes mellitus without complication ([place])   . COPD (chronic obstructive pulmonary disease) ([place])   . Hypercholesteremia unk  . Coronary artery disease   . Stroke ([place])   . Sleep apnea     does not use CPAP regularly.  . Sarcoidosis ([place])     .pro HOSPITAL COURSE:   patient is 45 year old African-American male with history of hypertension, COPD, diabetes, coronary artery disease, hyperlipidemia who presented to the hospital with complaints of cough, shortness of breath worsening over a period of time and not improving with antibiotic therapy. Patient is admitted to the hospital and eventually intubated due to worsening shortness of breath. Self extubated January 12. Initially was placed on BiPAP. However, now on 1.5 L of oxygen through nasal cannula, which is patient's baseline, BIPAP at night, denies any shortness of breath . CT scan of the chest revealed bilateral pleural effusions, atelectasis, moderate emphysema, concerning for acute on chronic diastolic CHF and COPD exacerbation, acute bronchitis/pneumonia. Patient was treated with broad-spectrum antibiotic therapy, diuretics and improved.   Physical therapist recommends home health physical therapy. Patient claimed he was going to use all his medications as well as CPAP/BiPAP at home the way its prescribed. He was felt to be stable to be discharged back home today Discussion by the problem #1. Acute on chronic respiratory failure with hypoxia and hypercapnia due to COPD exacerbation, acute on chronic diastolic CHF, status post intubation January 6 and self extubation January 12 , continue patient on oxygen per nasal cannulas, BIPAP/CPAP at night , now on 1 L of oxygen through nasal cannula,  oxygenation is satisfactory,, continue duo nebs, Advair, taper prednisone orally ,completed antibiotic course, appreciate pulmonologist input. Continue lasix at outpatient doses as CT of the chest without contrast revealed bilateral pleural effusions. Follow patient's kidney function closely . Oxygenation has improved . Continue incentive spirometry at home as well. Follow up with pulmonologist, Dr. [name] as outpatient.   #2. Acute bronchitis, completed antibiotic therapy. sputum cultures showed normal flora, MRSA screen by PCR was negative, blood cultures were negative. Clinically improved, follow-up as outpatient #3. COPD exacerbation, as above. Continue conservative therapy, taper steroids. Continue nebulizer therapy #4. Diabetes mellitus type 2. Resume outpatient medications #5 . Acute on chronic renal failure , superimposed on renal insufficiency, chronic , stage II -III , Follow urinary output as well as creatinine closely since patient is back on Lasix therapy, stable at present #6. Thrombocytopenia, follow  #7. Hypernatremia. Resolved  #8, slurred speech, status post intubation, resolved , initially was concerning for dysphagia, but Speech therapist evaluated and regular diet , was initiated  #9 acute on chronic diastolic CHF, echocardiogram done during  this admission revealed normal ejection fraction, moderate concentric LVH, mild  mitral and tricuspid regurgitation, patient is to continue Lasix, metoprolol, clonidine, blood pressure is well controlled DISCHARGE CONDITIONS:   Stable  CONSULTS OBTAINED:     DRUG ALLERGIES:   Allergies  Allergen Reactions  . Penicillins Anaphylaxis and Other (See Comments)    Unable to obtain enough information to answer additional questions about this medication.      DISCHARGE MEDICATIONS:   Discharge Medication List as of 02/21/2015  3:07 PM    START taking these medications   Details  ipratropium-albuterol (DUONEB) 0.5-2.5 (3) MG/3ML SOLN Take 3 mLs by nebulization 4 (four) times daily., Starting 02/21/2015, Until Discontinued, Normal    predniSONE (STERAPRED UNI-PAK 21 TAB) 10 MG (21) TBPK tablet Take 1 tablet (10 mg total) by mouth daily. Please take 6 pills in the morning on the first day, then taper by one pill daily until finished, Starting 02/21/2015, Until Discontinued, Normal      CONTINUE these medications which have NOT CHANGED   Details  aspirin 81 MG chewable tablet Chew 81 mg by mouth daily., Until Discontinued, Historical Med    atorvastatin (LIPITOR) 40 MG tablet Take 40 mg by mouth at bedtime. , Until Discontinued, Historical Med    cloNIDine (CATAPRES - DOSED IN MG/24 HR) 0.2 mg/24hr patch Place 0.2 mg onto the skin once a week., Until Discontinued, Historical Med    Fluticasone-Salmeterol (ADVAIR) 250-50 MCG/DOSE AEPB Inhale 1 puff into the lungs 2 (two) times daily., Until Discontinued, Historical Med    furosemide (LASIX) 40 MG tablet Take 1 tablet (40 mg total) by mouth 2 (two) times daily., Starting 08/26/2014, Until Discontinued, Print    insulin detemir (LEVEMIR) 100 UNIT/ML injection Inject 40 Units into the skin at bedtime. , Until Discontinued, Historical Med    insulin lispro (HUMALOG) 100 UNIT/ML injection Inject 8 Units into the skin 3 (three) times daily with meals. Sliding scale as needed, Until Discontinued, Historical Med    isosorbide  dinitrate (ISORDIL) 10 MG tablet Take 10 mg by mouth 3 (three) times daily., Until Discontinued, Historical Med    lisinopril (PRINIVIL,ZESTRIL) 10 MG tablet Take 10 mg by mouth daily., Until Discontinued, Historical Med    metFORMIN (GLUCOPHAGE-XR) 500 MG 24 hr tablet Take 1,000 mg by mouth 2 (two) times daily., Until Discontinued, Historical Med    metoprolol (LOPRESSOR) 100 MG tablet Take 100 mg by mouth 2 (two) times daily., Until Discontinued, Historical Med    tiotropium (SPIRIVA) 18 MCG inhalation capsule Place 18 mcg into inhaler and inhale daily., Until Discontinued, Historical Med         DISCHARGE INSTRUCTIONS:    Patient is to follow-up with his primary care physician, pulmonologist, cardiologist  If you experience worsening of your admission symptoms, develop shortness of breath, life threatening emergency, suicidal or homicidal thoughts you must seek medical attention immediately by calling 911 or calling your MD immediately  if symptoms less severe.  You Must read complete instructions/literature along with all the possible adverse reactions/side effects for all the Medicines you take and that have been prescribed to you. Take any new Medicines after you have completely understood and accept all the possible adverse reactions/side effects.   Please note  You were cared for by a hospitalist during your hospital stay. If you have any questions about your discharge medications or the care you received while you were in the hospital after you are discharged, you can call the unit and asked  to speak with the hospitalist on call if the hospitalist that took care of you is not available. Once you are discharged, your primary care physician will handle any further medical issues. Please note that NO REFILLS for any discharge medications will be authorized once you are discharged, as it is imperative that you return to your primary care physician (or establish a relationship with a  primary care physician if you do not have one) for your aftercare needs so that they can reassess your need for medications and monitor your lab values.    Today   CHIEF COMPLAINT:   Chief Complaint  Patient presents with  . Shortness of Breath    HISTORY OF PRESENT ILLNESS:  [name]  is a 45 y.o. male with a known history of hypertension, COPD, diabetes, coronary artery disease, hyperlipidemia who presented to the hospital with complaints of cough, shortness of breath worsening over a period of time and not improving with antibiotic therapy. Patient is admitted to the hospital and eventually intubated due to worsening shortness of breath. Self extubated January 12. Initially was placed on BiPAP. However, now on 1.5 L of oxygen through nasal cannula, which is patient's baseline, BIPAP at night, denies any shortness of breath . CT scan of the chest revealed bilateral pleural effusions, atelectasis, moderate emphysema, concerning for acute on chronic diastolic CHF and COPD exacerbation, acute bronchitis/pneumonia. Patient was treated with broad-spectrum antibiotic therapy, diuretics and improved.  Physical therapist recommends home health physical therapy. Patient claimed he was going to use all his medications as well as CPAP/BiPAP at home the way its prescribed. He was felt to be stable to be discharged back home today Discussion by the problem #1. Acute on chronic respiratory failure with hypoxia and hypercapnia due to COPD exacerbation, acute on chronic diastolic CHF, status post intubation January 6 and self extubation January 12 , continue patient on oxygen per nasal cannulas, BIPAP/CPAP at night , now on 1 L of oxygen through nasal cannula,  oxygenation is satisfactory,, continue duo nebs, Advair, taper prednisone orally ,completed antibiotic course, appreciate pulmonologist input. Continue lasix at outpatient doses as CT of the chest without contrast revealed bilateral pleural  effusions. Follow patient's kidney function closely . Oxygenation has improved . Continue incentive spirometry at home as well. Follow up with pulmonologist, Dr. [name] as outpatient.   #2. Acute bronchitis, completed antibiotic therapy. sputum cultures showed normal flora, MRSA screen by PCR was negative, blood cultures were negative. Clinically improved, follow-up as outpatient #3. COPD exacerbation, as above. Continue conservative therapy, taper steroids. Continue nebulizer therapy #4. Diabetes mellitus type 2. Resume outpatient medications #5 . Acute on chronic renal failure , superimposed on renal insufficiency, chronic , stage II -III , Follow urinary output as well as creatinine closely since patient is back on Lasix therapy, stable at present #6. Thrombocytopenia, follow  #7. Hypernatremia. Resolved  #8, slurred speech, status post intubation, resolved , initially was concerning for dysphagia, but Speech therapist evaluated and regular diet , was initiated  #9 acute on chronic diastolic CHF, echocardiogram done during this admission revealed normal ejection fraction, moderate concentric LVH, mild mitral and tricuspid regurgitation, patient is to continue Lasix, metoprolol, clonidine, blood pressure is well controlled    VITAL SIGNS:  Blood pressure 122/74, pulse 77, temperature 99 F (37.2 C), temperature source Oral, resp. rate 18, height 6' (1.829 m), weight 130.137 kg (286 lb 14.4 oz), SpO2 90 %.  I/O:   Intake/Output Summary (Last 24 hours) at 02/21/15  Caroline filed at 02/21/15 1300  Gross per 24 hour  Intake    780 ml  Output    800 ml  Net    -20 ml    PHYSICAL EXAMINATION:  GENERAL:  45 y.o.-year-old patient lying in the bed with no acute distress.  EYES: Pupils equal, round, reactive to light and accommodation. No scleral icterus. Extraocular muscles intact.  HEENT: Head atraumatic, normocephalic. Oropharynx and nasopharynx clear.  NECK:  Supple, no  jugular venous distention. No thyroid enlargement, no tenderness.  LUNGS: Normal breath sounds bilaterally, no wheezing, rales,rhonchi or crepitation. No use of accessory muscles of respiration.  CARDIOVASCULAR: S1, S2 normal. No murmurs, rubs, or gallops.  ABDOMEN: Soft, non-tender, non-distended. Bowel sounds present. No organomegaly or mass.  EXTREMITIES: No pedal edema, cyanosis, or clubbing.  NEUROLOGIC: Cranial nerves II through XII are intact. Muscle strength 5/5 in all extremities. Sensation intact. Gait not checked.  PSYCHIATRIC: The patient is alert and oriented x 3.  SKIN: No obvious rash, lesion, or ulcer.   DATA REVIEW:   CBC  Recent Labs Lab 02/16/15 0451 02/17/15 0441  WBC 8.6  --   HGB 16.0  --   HCT 51.9  --   PLT 128* 144*    Chemistries   Recent Labs Lab 02/19/15 0552  NA 140  K 4.1  CL 98*  CO2 37*  GLUCOSE 145*  BUN 33*  CREATININE 1.19  CALCIUM 8.5*    Cardiac Enzymes No results for input(s): TROPONINI in the last 168 hours.  Microbiology Results  Results for orders placed or performed during the hospital encounter of 02/11/15  Culture, blood (routine x 2)     Status: None   Collection Time: 02/11/15  6:38 AM  Result Value Ref Range Status   Specimen Description BLOOD RIGHT HAND  Final   Special Requests BOTTLES DRAWN AEROBIC AND ANAEROBIC  1CC  Final   Culture NO GROWTH 6 DAYS  Final   Report Status 02/17/2015 FINAL  Final  Culture, blood (routine x 2)     Status: None   Collection Time: 02/11/15  8:53 AM  Result Value Ref Range Status   Specimen Description BLOOD LEFT HAND  Final   Special Requests BOTTLES DRAWN AEROBIC AND ANAEROBIC  2CC  Final   Culture NO GROWTH 6 DAYS  Final   Report Status 02/17/2015 FINAL  Final  MRSA PCR Screening     Status: None   Collection Time: 02/11/15  2:00 PM  Result Value Ref Range Status   MRSA by PCR NEGATIVE NEGATIVE Final    Comment:        The GeneXpert MRSA Assay (FDA approved for NASAL  specimens only), is one component of a comprehensive MRSA colonization surveillance program. It is not intended to diagnose MRSA infection nor to guide or monitor treatment for MRSA infections.   Culture, expectorated sputum-assessment     Status: None   Collection Time: 02/12/15  4:02 PM  Result Value Ref Range Status   Specimen Description EXPECTORATED SPUTUM  Final   Special Requests NONE  Final   Sputum evaluation THIS SPECIMEN IS ACCEPTABLE FOR SPUTUM CULTURE  Final   Report Status 02/12/2015 FINAL  Final  Culture, respiratory (NON-Expectorated)     Status: None   Collection Time: 02/12/15  4:02 PM  Result Value Ref Range Status   Specimen Description EXPECTORATED SPUTUM  Final   Special Requests NONE Reflexed from [id number]  Final   Gram Stain  Final    FAIR SPECIMEN - 70-80% WBCS FEW WBC SEEN FEW GRAM POSITIVE COCCI RARE YEAST RARE GRAM VARIABLE ROD    Culture Consistent with normal respiratory flora.  Final   Report Status 02/15/2015 FINAL  Final    RADIOLOGY:  No results found.  EKG:   Orders placed or performed during the hospital encounter of 02/11/15  . ED EKG  . ED EKG  . EKG 12-Lead  . EKG 12-Lead      Management plans discussed with the patient, family and they are in agreement.  CODE STATUS:     Code Status Orders        Start     Ordered   02/11/15 1417  Full code   Continuous     02/11/15 1417    Code Status History    Date Active Date Inactive Code Status Order ID Comments User Context   12/28/2014  9:34 PM 12/30/2014  4:02 PM Full Code [id number]  Fritzi Mandes, MD Inpatient   08/24/2014  2:45 AM 08/26/2014  6:19 PM Full Code [id number]  Lytle Butte, MD ED   08/14/2014 10:31 AM 08/18/2014  6:30 PM Full Code [id number]  Vaughan Basta, MD Inpatient      TOTAL TIME TAKING CARE OF THIS PATIENT: 40 minutes.    Theodoro Grist M.D on 02/21/2015 at 5:16 PM  Between 7am to 6pm - Pager - [phone]  After 6pm go to [email or web address] -  password [email or web address] [facility]  Office  [phone]  CC: Primary care physician; Sabino Snipes KEY, MD

## 2015-02-21 NOTE — Progress Notes (Signed)
Patient had an appointment today (02/21/15) at the [facility] but since he's still in the hospital, have rescheduled it to 03/08/15 at 1:00pm. Thank you

## 2015-02-21 NOTE — Progress Notes (Signed)
Pt stable. PICC line removed using sterile technique and drsg clean, dry, intact 1 hour post removal. D/c instructions given and education provided. Prescriptions verified and electronically sent to local pharm. Pt states he understands instructions. Pt dressed and escorted out by staff. Driven home by family.

## 2015-02-21 NOTE — Discharge Instructions (Signed)
[facility] Failure Clinic appointment on March 08, 2015 at 1:00pm with Darylene Price, [place]. Please call [phone] to reschedule.

## 2015-03-08 ENCOUNTER — Ambulatory Visit: Payer: Medicare Other | Attending: Family | Admitting: Family

## 2015-03-08 ENCOUNTER — Encounter: Payer: Self-pay | Admitting: Family

## 2015-03-08 VITALS — BP 133/77 | HR 69 | Resp 20 | Ht 72.0 in | Wt 300.0 lb

## 2015-03-08 DIAGNOSIS — Z7984 Long term (current) use of oral hypoglycemic drugs: Secondary | ICD-10-CM | POA: Diagnosis not present

## 2015-03-08 DIAGNOSIS — Z794 Long term (current) use of insulin: Secondary | ICD-10-CM | POA: Insufficient documentation

## 2015-03-08 DIAGNOSIS — Z87891 Personal history of nicotine dependence: Secondary | ICD-10-CM | POA: Insufficient documentation

## 2015-03-08 DIAGNOSIS — Z880 Allergy status to penicillin: Secondary | ICD-10-CM | POA: Insufficient documentation

## 2015-03-08 DIAGNOSIS — E119 Type 2 diabetes mellitus without complications: Secondary | ICD-10-CM | POA: Diagnosis not present

## 2015-03-08 DIAGNOSIS — K029 Dental caries, unspecified: Secondary | ICD-10-CM | POA: Diagnosis not present

## 2015-03-08 DIAGNOSIS — I2510 Atherosclerotic heart disease of native coronary artery without angina pectoris: Secondary | ICD-10-CM | POA: Diagnosis not present

## 2015-03-08 DIAGNOSIS — Z79899 Other long term (current) drug therapy: Secondary | ICD-10-CM | POA: Insufficient documentation

## 2015-03-08 DIAGNOSIS — J449 Chronic obstructive pulmonary disease, unspecified: Secondary | ICD-10-CM | POA: Insufficient documentation

## 2015-03-08 DIAGNOSIS — Z7982 Long term (current) use of aspirin: Secondary | ICD-10-CM | POA: Insufficient documentation

## 2015-03-08 DIAGNOSIS — I5032 Chronic diastolic (congestive) heart failure: Secondary | ICD-10-CM

## 2015-03-08 DIAGNOSIS — G4733 Obstructive sleep apnea (adult) (pediatric): Secondary | ICD-10-CM

## 2015-03-08 DIAGNOSIS — J410 Simple chronic bronchitis: Secondary | ICD-10-CM

## 2015-03-08 DIAGNOSIS — E7800 Pure hypercholesterolemia, unspecified: Secondary | ICD-10-CM | POA: Insufficient documentation

## 2015-03-08 DIAGNOSIS — Z8673 Personal history of transient ischemic attack (TIA), and cerebral infarction without residual deficits: Secondary | ICD-10-CM | POA: Diagnosis not present

## 2015-03-08 DIAGNOSIS — I110 Hypertensive heart disease with heart failure: Secondary | ICD-10-CM | POA: Insufficient documentation

## 2015-03-08 DIAGNOSIS — G4730 Sleep apnea, unspecified: Secondary | ICD-10-CM | POA: Insufficient documentation

## 2015-03-08 MED ORDER — POTASSIUM CHLORIDE CRYS ER 20 MEQ PO TBCR: 20.0000 meq | EXTENDED_RELEASE_TABLET | Freq: Two times a day (BID) | ORAL | Status: DC

## 2015-03-08 NOTE — Progress Notes (Signed)
Subjective:    Patient ID: [name], male    DOB: 03/14/1970, 45 y.o.   MRN: [id number]  Congestive Heart Failure Presents for follow-up visit. The disease course has been stable. Associated symptoms include edema and fatigue (mild). Pertinent negatives include no abdominal pain, chest pain, chest pressure, orthopnea, palpitations or shortness of breath. The symptoms have been stable. Past treatments include salt and fluid restriction, beta blockers, ACE inhibitors and oxygen. The treatment provided moderate relief. Compliance with prior treatments has been good. His past medical history is significant for CAD, chronic lung disease, CVA, DM and HTN. He has one 1st degree relative with heart disease. Compliance with total regimen is 76-100%. Compliance problems include adherence to diet.  Compliance with diet is 76-100%.  Other This is a chronic (edema) problem. The current episode started more than 1 year ago. The problem has been gradually worsening. Associated symptoms include fatigue (mild). Pertinent negatives include no abdominal pain, chest pain, congestion, coughing, headaches, neck pain, numbness, sore throat or weakness. The symptoms are aggravated by standing and walking. He has tried position changes for the symptoms. The treatment provided mild relief.    Past Medical History  Diagnosis Date  . Hypertension   . Diabetes mellitus without complication ([place])   . COPD (chronic obstructive pulmonary disease) ([place])   . Hypercholesteremia unk  . Coronary artery disease   . Stroke ([place])   . Sleep apnea     does not use CPAP regularly.  . Sarcoidosis [facility])     Past Surgical History  Procedure Laterality Date  . Appendectomy      Family History  Problem Relation Age of Onset  . Hypertension Mother   . Heart disease Mother   . Diabetes Mother   . Cancer Father     Social History  Substance Use Topics  . Smoking status: Former Smoker -- 0.33 packs/day    Types:  Cigarettes    Quit date: 02/05/2015  . Smokeless tobacco: Never Used  . Alcohol Use: 0.0 oz/week    0 Standard drinks or equivalent per week     Comment: occassional    Allergies  Allergen Reactions  . Penicillins Anaphylaxis and Other (See Comments)    Unable to obtain enough information to answer additional questions about this medication.      Prior to Admission medications   Medication Sig Start Date End Date Taking? Authorizing Provider  albuterol (PROVENTIL) (2.5 MG/3ML) 0.083% nebulizer solution Take 2.5 mg by nebulization every 6 (six) hours as needed for wheezing or shortness of breath.   Yes Historical Provider, MD  aspirin 81 MG chewable tablet Chew 81 mg by mouth daily.   Yes Historical Provider, MD  atorvastatin (LIPITOR) 40 MG tablet Take 40 mg by mouth at bedtime.    Yes Historical Provider, MD  cloNIDine (CATAPRES - DOSED IN MG/24 HR) 0.2 mg/24hr patch Place 0.2 mg onto the skin once a week.   Yes Historical Provider, MD  Fluticasone-Salmeterol (ADVAIR) 250-50 MCG/DOSE AEPB Inhale 1 puff into the lungs 2 (two) times daily.   Yes Historical Provider, MD  furosemide (LASIX) 40 MG tablet Take 1 tablet (40 mg total) by mouth 2 (two) times daily. 08/26/14  Yes Dustin Flock, MD  insulin detemir (LEVEMIR) 100 UNIT/ML injection Inject 40 Units into the skin at bedtime.    Yes Historical Provider, MD  insulin lispro (HUMALOG) 100 UNIT/ML injection Inject 8 Units into the skin 3 (three) times daily with meals. Sliding scale  as needed   Yes Historical Provider, MD  ipratropium-albuterol (DUONEB) 0.5-2.5 (3) MG/3ML SOLN Take 3 mLs by nebulization 4 (four) times daily. 02/21/15  Yes Theodoro Grist, MD  isosorbide dinitrate (ISORDIL) 10 MG tablet Take 10 mg by mouth 3 (three) times daily.   Yes Historical Provider, MD  lisinopril (PRINIVIL,ZESTRIL) 10 MG tablet Take 10 mg by mouth daily.   Yes Historical Provider, MD  metFORMIN (GLUCOPHAGE-XR) 500 MG 24 hr tablet Take 500 mg by mouth 2  (two) times daily.    Yes Historical Provider, MD  metoprolol (LOPRESSOR) 100 MG tablet Take 100 mg by mouth 2 (two) times daily.   Yes Historical Provider, MD  tiotropium (SPIRIVA) 18 MCG inhalation capsule Place 18 mcg into inhaler and inhale daily.   Yes Historical Provider, MD     Review of Systems  Constitutional: Positive for fatigue (mild). Negative for appetite change.  HENT: Positive for dental problem. Negative for congestion, postnasal drip and sore throat.   Eyes: Negative.   Respiratory: Negative for cough, chest tightness, shortness of breath and wheezing.   Cardiovascular: Positive for leg swelling. Negative for chest pain and palpitations.  Gastrointestinal: Negative for abdominal pain and abdominal distention.  Endocrine: Negative.   Genitourinary: Negative.   Musculoskeletal: Negative for back pain and neck pain.  Skin: Negative.   Allergic/Immunologic: Negative.   Neurological: Negative for dizziness, weakness, light-headedness, numbness and headaches.  Hematological: Negative for adenopathy. Does not bruise/bleed easily.  Psychiatric/Behavioral: Positive for sleep disturbance (sleeping on 2 pillows. oxygen with bipap nightly). Negative for dysphoric mood. The patient is not nervous/anxious.        Objective:   Physical Exam  Constitutional: He is oriented to person, place, and time. He appears well-developed and well-nourished.  HENT:  Head: Normocephalic and atraumatic.  Eyes: Conjunctivae are normal. Pupils are equal, round, and reactive to light.  Neck: Normal range of motion. Neck supple.  Cardiovascular: Normal rate and regular rhythm.   Pulmonary/Chest: Effort normal. He has no wheezes. He has no rales.  Abdominal: Soft. He exhibits no distension. There is no tenderness.  Musculoskeletal: He exhibits edema (2+ pitting edema in bilateral lower legs). He exhibits no tenderness.  Neurological: He is alert and oriented to person, place, and time.  Skin: Skin  is warm and dry.  Psychiatric: He has a normal mood and affect. His behavior is normal. Thought content normal.  Nursing note and vitals reviewed.   BP 133/77 mmHg  Pulse 69  Resp 20  Ht 6' (1.829 m)  Wt 300 lb (136.079 kg)  BMI 40.68 kg/m2  SpO2 98%       Assessment & Plan:  1: Chronic heart failure with preserved ejection fraction- Patient presents with a mild amount of fatigue upon exertion. He said that he did fine walking into the office today. He denies any shortness of breath upon exertion or at rest. Continues to weigh himself and he says that he's noticed a gradual weight gain. By our scale, he's gained 10 pounds since he was last here in October 2016. Reminded to call for an overnight weight gain of >2 pounds or a weekly weight gain of >5 pounds. He does admit to adding salt to his food while he's cooking and then, on occasion, after it's been cooked. Discussed the importance of not adding any salt to his food and to closely follow a 2000mg  sodium diet. He says that he'll try to decrease his usage of salt. He has pitting edema in  both of his lower legs so will have him double his diuretic for the next 3 days which would be 80mg  twice daily. Will add potassium 60meq twice daily for the next 3 days as well. May need to change diuretic to torsemide. Will check lab work next week. Says that he also drinks about 64-80 ounces of water in addition to juice/soda daily. He was instructed to decrease his fluid intake to 48 ounces total.  Does not take the flu vaccine.  2: COPD- No wheezing at this time. Continues to wear his oxygen around the clock at 2L as well as use his inhalers. Sees Dr. [name] 03/10/15. Handicap application filled out for patient.  3: Obstructive sleep apnea- Patient wears his bipap on a nightly basis.  4: Dental caries- Is due to have some teeth pulled as well as filled. Patient cleared to have them pulled.    Return here in 1 week for a recheck of symptoms and to check  lab work.

## 2015-03-08 NOTE — Patient Instructions (Signed)
Continue weighing daily and call for an overnight weight gain of > 2 pounds or a weekly weight gain of >5 pounds.  Increase fluid pill to 2 tablets twice daily.      Low-Sodium Eating Plan Sodium raises blood pressure and causes water to be held in the body. Getting less sodium from food will help lower your blood pressure, reduce any swelling, and protect your heart, liver, and kidneys. We get sodium by adding salt (sodium chloride) to food. Most of our sodium comes from canned, boxed, and frozen foods. Restaurant foods, fast foods, and pizza are also very high in sodium. Even if you take medicine to lower your blood pressure or to reduce fluid in your body, getting less sodium from your food is important. WHAT IS MY PLAN? Most people should limit their sodium intake to 2,300 mg a day. Your health care provider recommends that you limit your sodium intake to __2000mg __ a day.  WHAT DO I NEED TO KNOW ABOUT THIS EATING PLAN? For the low-sodium eating plan, you will follow these general guidelines:  Choose foods with a % Daily Value for sodium of less than 5% (as listed on the food label).   Use salt-free seasonings or herbs instead of table salt or sea salt.   Check with your health care provider or pharmacist before using salt substitutes.   Eat fresh foods.  Eat more vegetables and fruits.  Limit canned vegetables. If you do use them, rinse them well to decrease the sodium.   Limit cheese to 1 oz (28 g) per day.   Eat lower-sodium products, often labeled as "lower sodium" or "no salt added."  Avoid foods that contain monosodium glutamate (MSG). MSG is sometimes added to [place] food and some canned foods.  Check food labels (Nutrition Facts labels) on foods to learn how much sodium is in one serving.  Eat more home-cooked food and less restaurant, buffet, and fast food.  When eating at a restaurant, ask that your food be prepared with less salt, or no salt if possible.   HOW DO I READ FOOD LABELS FOR SODIUM INFORMATION? The Nutrition Facts label lists the amount of sodium in one serving of the food. If you eat more than one serving, you must multiply the listed amount of sodium by the number of servings. Food labels may also identify foods as:  Sodium free--Less than 5 mg in a serving.  Very low sodium--35 mg or less in a serving.  Low sodium--140 mg or less in a serving.  Light in sodium--50% less sodium in a serving. For example, if a food that usually has 300 mg of sodium is changed to become light in sodium, it will have 150 mg of sodium.  Reduced sodium--25% less sodium in a serving. For example, if a food that usually has 400 mg of sodium is changed to reduced sodium, it will have 300 mg of sodium. WHAT FOODS CAN I EAT? Grains Low-sodium cereals, including oats, puffed wheat and rice, and shredded wheat cereals. Low-sodium crackers. Unsalted rice and pasta. Lower-sodium bread.  Vegetables Frozen or fresh vegetables. Low-sodium or reduced-sodium canned vegetables. Low-sodium or reduced-sodium tomato sauce and paste. Low-sodium or reduced-sodium tomato and vegetable juices.  Fruits Fresh, frozen, and canned fruit. Fruit juice.  Meat and Other Protein Products Low-sodium canned tuna and salmon. Fresh or frozen meat, poultry, seafood, and fish. Lamb. Unsalted nuts. Dried beans, peas, and lentils without added salt. Unsalted canned beans. Homemade soups without salt. Eggs.  Dairy Milk. Soy milk. Ricotta cheese. Low-sodium or reduced-sodium cheeses. Yogurt.  Condiments Fresh and dried herbs and spices. Salt-free seasonings. Onion and garlic powders. Low-sodium varieties of mustard and ketchup. Fresh or refrigerated horseradish. Lemon juice.  Fats and Oils Reduced-sodium salad dressings. Unsalted butter.  Other Unsalted popcorn and pretzels.  The items listed above may not be a complete list of recommended foods or beverages. Contact your  dietitian for more options. WHAT FOODS ARE NOT RECOMMENDED? Grains Instant hot cereals. Bread stuffing, pancake, and biscuit mixes. Croutons. Seasoned rice or pasta mixes. Noodle soup cups. Boxed or frozen macaroni and cheese. Self-rising flour. Regular salted crackers. Vegetables Regular canned vegetables. Regular canned tomato sauce and paste. Regular tomato and vegetable juices. Frozen vegetables in sauces. Salted [place] fries. Olives. Angie Fava. Relishes. Sauerkraut. Salsa. Meat and Other Protein Products Salted, canned, smoked, spiced, or pickled meats, seafood, or fish. Bacon, ham, sausage, hot dogs, corned beef, chipped beef, and packaged luncheon meats. Salt pork. Jerky. Pickled herring. Anchovies, regular canned tuna, and sardines. Salted nuts. Dairy Processed cheese and cheese spreads. Cheese curds. Blue cheese and cottage cheese. Buttermilk.  Condiments Onion and garlic salt, seasoned salt, table salt, and sea salt. Canned and packaged gravies. Worcestershire sauce. Tartar sauce. Barbecue sauce. Teriyaki sauce. Soy sauce, including reduced sodium. Steak sauce. Fish sauce. Oyster sauce. Cocktail sauce. Horseradish that you find on the shelf. Regular ketchup and mustard. Meat flavorings and tenderizers. Bouillon cubes. Hot sauce. Tabasco sauce. Marinades. Taco seasonings. Relishes. Fats and Oils Regular salad dressings. Salted butter. Margarine. Ghee. Bacon fat.  Other Potato and tortilla chips. Corn chips and puffs. Salted popcorn and pretzels. Canned or dried soups. Pizza. Frozen entrees and pot pies.  The items listed above may not be a complete list of foods and beverages to avoid. Contact your dietitian for more information.   This information is not intended to replace advice given to you by your health care provider. Make sure you discuss any questions you have with your health care provider.   Document Released: 07/14/2001 Document Revised: 02/12/2014 Document Reviewed:  11/26/2012 Elsevier Interactive Patient Education [employer].

## 2015-03-15 ENCOUNTER — Ambulatory Visit: Payer: Medicare Other | Attending: Family | Admitting: Family

## 2015-03-15 ENCOUNTER — Encounter: Payer: Self-pay | Admitting: Family

## 2015-03-15 VITALS — BP 126/75 | HR 86 | Resp 18 | Ht 72.0 in | Wt 296.0 lb

## 2015-03-15 DIAGNOSIS — Z794 Long term (current) use of insulin: Secondary | ICD-10-CM | POA: Insufficient documentation

## 2015-03-15 DIAGNOSIS — G4730 Sleep apnea, unspecified: Secondary | ICD-10-CM | POA: Diagnosis not present

## 2015-03-15 DIAGNOSIS — E7800 Pure hypercholesterolemia, unspecified: Secondary | ICD-10-CM | POA: Diagnosis not present

## 2015-03-15 DIAGNOSIS — Z880 Allergy status to penicillin: Secondary | ICD-10-CM | POA: Insufficient documentation

## 2015-03-15 DIAGNOSIS — I2510 Atherosclerotic heart disease of native coronary artery without angina pectoris: Secondary | ICD-10-CM | POA: Insufficient documentation

## 2015-03-15 DIAGNOSIS — I5032 Chronic diastolic (congestive) heart failure: Secondary | ICD-10-CM | POA: Insufficient documentation

## 2015-03-15 DIAGNOSIS — E119 Type 2 diabetes mellitus without complications: Secondary | ICD-10-CM | POA: Diagnosis not present

## 2015-03-15 DIAGNOSIS — Z79899 Other long term (current) drug therapy: Secondary | ICD-10-CM | POA: Insufficient documentation

## 2015-03-15 DIAGNOSIS — G4733 Obstructive sleep apnea (adult) (pediatric): Secondary | ICD-10-CM | POA: Diagnosis not present

## 2015-03-15 DIAGNOSIS — Z8673 Personal history of transient ischemic attack (TIA), and cerebral infarction without residual deficits: Secondary | ICD-10-CM | POA: Insufficient documentation

## 2015-03-15 DIAGNOSIS — I110 Hypertensive heart disease with heart failure: Secondary | ICD-10-CM | POA: Insufficient documentation

## 2015-03-15 DIAGNOSIS — J449 Chronic obstructive pulmonary disease, unspecified: Secondary | ICD-10-CM | POA: Diagnosis not present

## 2015-03-15 DIAGNOSIS — Z87891 Personal history of nicotine dependence: Secondary | ICD-10-CM | POA: Insufficient documentation

## 2015-03-15 DIAGNOSIS — I120 Hypertensive chronic kidney disease with stage 5 chronic kidney disease or end stage renal disease: Secondary | ICD-10-CM | POA: Diagnosis not present

## 2015-03-15 DIAGNOSIS — J410 Simple chronic bronchitis: Secondary | ICD-10-CM

## 2015-03-15 DIAGNOSIS — Z7982 Long term (current) use of aspirin: Secondary | ICD-10-CM | POA: Diagnosis not present

## 2015-03-15 LAB — BASIC METABOLIC PANEL
ANION GAP: 9 (ref 5–15)
BUN: 21 mg/dL — ABNORMAL HIGH (ref 6–20)
CALCIUM: 9 mg/dL (ref 8.9–10.3)
CHLORIDE: 102 mmol/L (ref 101–111)
CO2: 32 mmol/L (ref 22–32)
CREATININE: 1.23 mg/dL (ref 0.61–1.24)
GFR calc non Af Amer: >60 mL/min (ref >60)
Glucose, Bld: 131 mg/dL — ABNORMAL HIGH (ref 65–99)
Potassium: 3.3 mmol/L — ABNORMAL LOW (ref 3.5–5.1)
SODIUM: 143 mmol/L (ref 135–145)

## 2015-03-15 MED ORDER — FUROSEMIDE 80 MG PO TABS: 80.0000 mg | ORAL_TABLET | Freq: Two times a day (BID) | ORAL | Status: DC

## 2015-03-15 NOTE — Patient Instructions (Signed)
Continue weighing daily and call for an overnight weight gain of > 2 pounds or a weekly weight gain of >5 pounds. 

## 2015-03-15 NOTE — Progress Notes (Signed)
Subjective:    Patient ID: [name], male    DOB: September 01, 1970, 45 y.o.   MRN: [id number]  Congestive Heart Failure Presents for follow-up visit. The disease course has been stable. Associated symptoms include edema, fatigue and shortness of breath (infrequent). Pertinent negatives include no abdominal pain, chest pain, chest pressure, orthopnea or palpitations. The symptoms have been stable. Past treatments include salt and fluid restriction, oxygen, ACE inhibitors and beta blockers. The treatment provided moderate relief. Compliance with prior treatments has been good. His past medical history is significant for CAD, chronic lung disease, CVA, DM and HTN. He has one 1st degree relative with heart disease. Compliance with total regimen is 76-100%.  Other This is a chronic (edema) problem. The current episode started more than 1 year ago. The problem occurs daily. The problem has been gradually improving. Associated symptoms include coughing (slightly) and fatigue. Pertinent negatives include no abdominal pain, chest pain, congestion, headaches, nausea, neck pain or weakness. The symptoms are aggravated by standing and walking. He has tried position changes for the symptoms. The treatment provided mild relief.    Past Medical History  Diagnosis Date  . Hypertension   . Diabetes mellitus without complication ([place])   . COPD (chronic obstructive pulmonary disease) ([place])   . Hypercholesteremia unk  . Coronary artery disease   . Stroke ([place])   . Sleep apnea     does not use CPAP regularly.  . Sarcoidosis [facility])     Past Surgical History  Procedure Laterality Date  . Appendectomy      Family History  Problem Relation Age of Onset  . Hypertension Mother   . Heart disease Mother   . Diabetes Mother   . Cancer Father     Social History  Substance Use Topics  . Smoking status: Former Smoker -- 0.33 packs/day    Types: Cigarettes    Quit date: 02/05/2015  . Smokeless tobacco:  Never Used  . Alcohol Use: 0.0 oz/week    0 Standard drinks or equivalent per week     Comment: occassional    Allergies  Allergen Reactions  . Penicillins Anaphylaxis and Other (See Comments)    Unable to obtain enough information to answer additional questions about this medication.      Prior to Admission medications   Medication Sig Start Date End Date Taking? Authorizing Provider  furosemide (LASIX) 80 MG tablet Take 1 tablet (80 mg total) by mouth 2 (two) times daily. 03/15/15  Yes Alisa Graff, FNP  albuterol (PROVENTIL) (2.5 MG/3ML) 0.083% nebulizer solution Take 2.5 mg by nebulization every 6 (six) hours as needed for wheezing or shortness of breath.    Historical Provider, MD  aspirin 81 MG chewable tablet Chew 81 mg by mouth daily.    Historical Provider, MD  atorvastatin (LIPITOR) 40 MG tablet Take 40 mg by mouth at bedtime.     Historical Provider, MD  cloNIDine (CATAPRES - DOSED IN MG/24 HR) 0.2 mg/24hr patch Place 0.2 mg onto the skin once a week.    Historical Provider, MD  Fluticasone-Salmeterol (ADVAIR) 250-50 MCG/DOSE AEPB Inhale 1 puff into the lungs 2 (two) times daily.    Historical Provider, MD  insulin detemir (LEVEMIR) 100 UNIT/ML injection Inject 40 Units into the skin at bedtime.     Historical Provider, MD  insulin lispro (HUMALOG) 100 UNIT/ML injection Inject 8 Units into the skin 3 (three) times daily with meals. Sliding scale as needed    Historical Provider, MD  ipratropium-albuterol (DUONEB) 0.5-2.5 (3) MG/3ML SOLN Take 3 mLs by nebulization 4 (four) times daily. 02/21/15   Theodoro Grist, MD  isosorbide dinitrate (ISORDIL) 10 MG tablet Take 10 mg by mouth 3 (three) times daily.    Historical Provider, MD  lisinopril (PRINIVIL,ZESTRIL) 10 MG tablet Take 10 mg by mouth daily.    Historical Provider, MD  metFORMIN (GLUCOPHAGE-XR) 500 MG 24 hr tablet Take 500 mg by mouth 2 (two) times daily.     Historical Provider, MD  metoprolol (LOPRESSOR) 100 MG tablet Take  100 mg by mouth 2 (two) times daily.    Historical Provider, MD  potassium chloride SA (K-DUR,KLOR-CON) 20 MEQ tablet Take 1 tablet (20 mEq total) by mouth 2 (two) times daily. 03/08/15   Alisa Graff, FNP  tiotropium (SPIRIVA) 18 MCG inhalation capsule Place 18 mcg into inhaler and inhale daily.    Historical Provider, MD     Review of Systems  Constitutional: Positive for fatigue. Negative for appetite change.  HENT: Negative for congestion and postnasal drip.   Eyes: Negative.   Respiratory: Positive for cough (slightly), shortness of breath (infrequent) and wheezing (at times). Negative for chest tightness.   Cardiovascular: Positive for leg swelling. Negative for chest pain and palpitations.  Gastrointestinal: Negative for nausea, abdominal pain and abdominal distention.  Endocrine: Negative.   Genitourinary: Negative.   Musculoskeletal: Negative for back pain and neck pain.  Skin: Negative.   Allergic/Immunologic: Negative.   Neurological: Negative for dizziness, weakness, light-headedness and headaches.  Hematological: Negative for adenopathy. Does not bruise/bleed easily.  Psychiatric/Behavioral: Negative for sleep disturbance (wearing bipap. sleeping on 2 pillows) and dysphoric mood. The patient is not nervous/anxious.        Objective:   Physical Exam  Constitutional: He is oriented to person, place, and time. He appears well-developed and well-nourished.  HENT:  Head: Normocephalic and atraumatic.  Eyes: Conjunctivae are normal. Pupils are equal, round, and reactive to light.  Neck: Normal range of motion. Neck supple.  Cardiovascular: Normal rate and regular rhythm.   No murmur heard. Pulmonary/Chest: Effort normal. He has no wheezes. He has no rales.  Abdominal: Soft. He exhibits no distension. There is no tenderness.  Musculoskeletal: He exhibits edema (2+ pitting edema in bilateral lower legs). He exhibits no tenderness.  Neurological: He is alert and oriented to  person, place, and time.  Skin: Skin is warm and dry.  Psychiatric: He has a normal mood and affect. His behavior is normal. Thought content normal.  Nursing note and vitals reviewed.  BP 126/75 mmHg  Pulse 86  Resp 18  Ht 6' (1.829 m)  Wt 296 lb (134.265 kg)  BMI 40.14 kg/m2  SpO2 95%        Assessment & Plan:  1: Chronic heart failure with preserved ejection fraction- Patient presents with mild fatigue and infrequent shortness of breath upon exertion. He says that he didn't experience any symptoms upon walking into the office today. He continues to have some pedal edema in bilateral lower legs but says that they do feel less tight. He admits that he's really not elevating them during the day. Continues to weigh himself and has noticed a slight weight loss. By our scale, he's lost 3.4 pounds in the last week. Will continue the 80mg  furosemide dose BID with the possibility of changing to torsemide. He didn't pick up the potassium that was e-prescribed last week and says that he'll get it today. Basic metabolic panel drawn today. Discussed decreasing his  fluid consumption last week and he says that he's decreased it "slightly". Discussed the importance of limiting his fluid intake to at least 48 ounces daily. He is going to the gym 4 days a week. Goes to see cardiology June 2017. 2: COPD- Continues to wear his oxygen at 1.5L and use his inhalers. Saw his pulmonologist 03/10/15. 3: Obstructive sleep apnea- Patient continues to wear his Bipap on a nightly basis. Feels like he is sleeping well.   Return here in 3 months or sooner for any questions/problems before then.

## 2015-03-16 ENCOUNTER — Telehealth: Payer: Self-pay | Admitting: Family

## 2015-03-16 NOTE — Telephone Encounter (Signed)
Reviewed labs that were drawn on 03/15/15 and patient informed that renal function looked good but that his potassium level was low. He had not picked up the potassium prescription that was called in last week & he says that he will pick it up today and begin taking it twice daily. Will recheck a basic metabolic panel on 0000000.

## 2015-03-24 ENCOUNTER — Encounter: Payer: Self-pay | Admitting: Speech Pathology

## 2015-03-24 ENCOUNTER — Ambulatory Visit: Payer: Medicare Other | Attending: Family Medicine | Admitting: Speech Pathology

## 2015-03-24 DIAGNOSIS — R490 Dysphonia: Secondary | ICD-10-CM | POA: Diagnosis present

## 2015-03-25 NOTE — Therapy (Signed)
[place] MAIN [facility] SERVICES [place], [place], [address] Phone: [phone]   Fax:  [phone]  Speech Language Pathology Evaluation  Patient Details  Name: [name] MRN: [id number] Date of Birth: 07-Dec-1970 Referring Provider: Dr. Gwynneth Aliment  Encounter Date: 03/24/2015      End of Session - 03/25/15 1317    Visit Number 1   Number of Visits 17   Date for SLP Re-Evaluation 05/19/15   SLP Start Time 1015   SLP Stop Time  1100   SLP Time Calculation (min) 45 min   Activity Tolerance Patient tolerated treatment well      Past Medical History  Diagnosis Date   Hypertension    Diabetes mellitus without complication ([place])    COPD (chronic obstructive pulmonary disease) ([place])    Hypercholesteremia unk   Coronary artery disease    Stroke ([place])    Sleep apnea     does not use CPAP regularly.   Sarcoidosis [facility])     Past Surgical History  Procedure Laterality Date   Appendectomy      There were no vitals filed for this visit.  Visit Diagnosis: Dysphonia      Subjective Assessment - 03/25/15 1315    Subjective [name] is a pleasant 45 year-old male that presents today with dysphonia status post self-extubation on 02/18/2015 resulting from complications due to COPD. Patient notes complications with his voice with sudden onset and characterized by hoarseness, effortful phonation, and not enough breath support. Patient states that there is no pain in his throat associated with his voice related concerns. Prior to the most recent COPD related complications, [name] states that he had some difficulty with his voice and associates the trouble with a heart attack he experienced over 2 years ago. 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 However, the dysphonia he is  experiencing presently is far more severe.  Patient states his voice problem has been consistent since his extubation and is consistently hoarse throughout the day. Patient states that he has received some speech-language services in the acute care setting pertinent to his voice-related concerns and that the exercises he was given may have had a small positive impact on his voice. Clinical perceptual evaluation of patient's voice revealed glottal fry, pharyngeal focus, hoarseness, strained/strangled quality, roughness, breathiness, and asthenia. Most of these attributes appear to be habitual and functionally related, however, the importance of seeking an evaluation from an ENT to rule out any organic components was stressed to [name].             SLP Evaluation [facility] - 03/25/15 1320    SLP Visit Information   SLP Received On 03/24/15   Medical Diagnosis Dysphonia   Subjective   Subjective [name] is a pleasant 45 year-old male that presents today with dysphonia status post self-extubation on 02/18/2015 resulting from complications due to COPD. Patient notes complications with his voice with sudden onset and characterized by hoarseness, effortful phonation, and not enough breath support. Patient states that there is no pain in his throat associated with his voice related concerns. Prior to the most recent COPD related complications, [name] states that he had some difficulty with his voice and associates the trouble with a heart attack he experienced over 2 years ago. 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 However, the dysphonia he is experiencing presently is far more severe.  Patient states his voice problem has been consistent since his extubation and is consistently hoarse throughout the day. Patient states that he has received some  speech-language services in the acute care setting pertinent to his voice-related concerns and that the exercises he was given may have had a small positive impact on his voice. Clinical perceptual evaluation of patient's voice revealed glottal fry, pharyngeal focus, hoarseness, strained/strangled quality, roughness, breathiness, and asthenia. Most of these attributes appear to be habitual and functionally related, however, the importance of seeking an evaluation from an ENT to rule out any organic components was stressed to [name].    Prior Functional Status   Cognitive/Linguistic Baseline Within functional limits   Oral Motor/Sensory Function   Overall Oral Motor/Sensory Function Appears within functional limits for tasks assessed   Motor Speech   Overall Motor Speech Impaired   Respiration Impaired   Level of Impairment Sentence   Phonation Breathy;Hoarse;Low vocal intensity   Resonance Within functional limits   Articulation Within functional limitis   Phonation Impaired   Vocal Abuses Other (comment)  strained   Standardized Assessments   Standardized Assessments  Other Assessment  perceptual voice evaluation      Perceptual Voice Evaluation Voice checklist:  Health risks  Characteristic voice use  Environmental risks  Misuse  Abuse  Vocal characteristics Average fundamental frequency during sustained ah: 127.9 Average time patient was able to sustain /s/: 4.6 seconds Average time patient was able to sustain /z/: 2.3 seconds s/z ratio : 2 Visi-Pitch: Multi-Dimensional Voice Program (MDVP) MDVP extracts objective quantitative values (Relative Average Perturbation, Shimmer, Voice Turbulence Index, and Noise to Harmonic Ratio) on sustained phonation, which are displayed graphically and numerically in comparison to a built-in normative database.  The patient exhibited values outside the norm for Shimmer and Voice Turbulence Index.  Average fundamental frequency was  within the average for age and gender. Stimulability: Patient was stimulable for improved vocal tone for cues to elongate nasals and focus on a forward placement of the voice.                     SLP Education - 03/25/15 1316    Education provided Yes   Education Details the role of the SLP in treatment of dysphonia   Person(s) Educated Patient   Methods Explanation;Demonstration   Comprehension Verbalized understanding;Returned demonstration            SLP Long Term Goals - 03/25/15 1328    SLP LONG TERM GOAL #1   Title The patient will demonstrate independent understanding of vocal hygiene concepts and neck, shoulder, lingual stretching exercises.   SLP LONG TERM GOAL #2   Title The patient will be independent for abdominal breathing and breath support exercises.   SLP LONG TERM GOAL #3   Title The patient will minimize vocal tension via Yawn-Sigh approach (or comparable technique) with min SLP cues with 80% accuracy.   SLP LONG TERM GOAL #4   Title The patient will maintain relaxed phonation / oral resonance for paragraph length recitation with 80% accuracy.   SLP LONG TERM GOAL #5   Title The patient will maximize voice quality and loudness using breath support for paragraph length recitation with 80% accuracy.  Plan - 03/25/15 1320    Clinical Impression Statement This 46 year old man under the care of Dr. Gwynneth Aliment is presenting with moderate-severe dysphonia.  The patient demonstrates hoarse vocal quality, reduced breath control for speech, strained/tense phonation, limited pitch range, vocal fatigue, pharyngeal focus, asthenia, and laryngeal tension. He will benefit from voice therapy for education, to improve breath support, improve tone focus, strengthen the laryngeal mechanism, and learn techniques to increase loudness and pitch range without strain or pain.   Speech Therapy Frequency 2x / week   Duration Other (comment)  8 weeks    Treatment/Interventions Other (comment)  voice therapy   Potential to Achieve Goals Good   Potential Considerations Ability to learn/carryover information;Severity of impairments   SLP Home Exercise Plan to be determined   Consulted and Agree with Plan of Care Patient        Problem List Patient Active Problem List   Diagnosis Date Noted   Dental caries 03/08/2015   Morbid obesity ([place]) 02/21/2015   Obstructive sleep apnea 02/21/2015   Thrombocytopenia ([place]) 02/21/2015   Hypernatremia 02/21/2015   COPD exacerbation ([place]) 02/11/2015   Diabetes ([place]) 10/19/2014   Chronic diastolic heart failure ([place]) 10/18/2014   Chronic obstructive pulmonary disease ([place]) 10/18/2014   Sarcoidosis ([place]) 04/22/2013   Other and unspecified hyperlipidemia 04/22/2013    Wynelle Cleveland 03/25/2015, 1:56 PM  [place] MAIN [facility] SERVICES [address] [place], [place], [address] Phone: [phone]   Fax:  [phone]  Name: [name] MRN: [id number] Date of Birth: Jul 01, 1970

## 2015-03-29 ENCOUNTER — Ambulatory Visit: Payer: Medicare Other | Admitting: Speech Pathology

## 2015-03-31 ENCOUNTER — Other Ambulatory Visit
Admission: RE | Admit: 2015-03-31 | Discharge: 2015-03-31 | Disposition: A | Discharge status: Home / Self Care | Payer: Medicare Other | Source: Ambulatory Visit | Attending: Family | Admitting: Family

## 2015-03-31 ENCOUNTER — Ambulatory Visit: Payer: Medicare Other | Admitting: Speech Pathology

## 2015-03-31 DIAGNOSIS — R490 Dysphonia: Secondary | ICD-10-CM | POA: Diagnosis not present

## 2015-03-31 DIAGNOSIS — E876 Hypokalemia: Secondary | ICD-10-CM | POA: Diagnosis present

## 2015-03-31 LAB — BASIC METABOLIC PANEL
Anion gap: 9 (ref 5–15)
BUN: 10 mg/dL (ref 6–20)
CALCIUM: 8.6 mg/dL — AB (ref 8.9–10.3)
CO2: 30 mmol/L (ref 22–32)
Chloride: 101 mmol/L (ref 101–111)
Creatinine, Ser: 1.31 mg/dL — ABNORMAL HIGH (ref 0.61–1.24)
GFR calc Af Amer: >60 mL/min (ref >60)
GLUCOSE: 187 mg/dL — AB (ref 65–99)
POTASSIUM: 3.8 mmol/L (ref 3.5–5.1)
Sodium: 140 mmol/L (ref 135–145)

## 2015-04-01 ENCOUNTER — Telehealth: Payer: Self-pay | Admitting: Family

## 2015-04-01 ENCOUNTER — Encounter: Payer: Self-pay | Admitting: Speech Pathology

## 2015-04-01 ENCOUNTER — Other Ambulatory Visit: Payer: Self-pay | Admitting: Family

## 2015-04-01 MED ORDER — POTASSIUM CHLORIDE CRYS ER 20 MEQ PO TBCR: 20.0000 meq | EXTENDED_RELEASE_TABLET | Freq: Two times a day (BID) | ORAL | Status: DC

## 2015-04-01 MED ORDER — ISOSORBIDE DINITRATE 10 MG PO TABS: 10.0000 mg | ORAL_TABLET | Freq: Three times a day (TID) | ORAL | Status: DC

## 2015-04-01 NOTE — Telephone Encounter (Signed)
Spoke with patient and explained that his potassium level looked good and for him to continue taking his potassium tablets as twice daily. Renal function slightly elevated so will need to keep an eye on that.

## 2015-04-01 NOTE — Therapy (Signed)
[place] MAIN [facility] SERVICES [address] [place], [place], [address] Phone: [phone]   Fax:  [phone]  Speech Language Pathology Treatment  Patient Details  Name: THANH WINGER MRN: [id number] Date of Birth: 12/07/1970 Referring Provider: Dr. Gwynneth Aliment  Encounter Date: 03/31/2015      End of Session - 04/01/15 0946    Visit Number 2   Number of Visits 17   Date for SLP Re-Evaluation 05/19/15   SLP Start Time 1115   SLP Stop Time  1200   SLP Time Calculation (min) 45 min   Activity Tolerance Patient tolerated treatment well      Past Medical History  Diagnosis Date  . Hypertension   . Diabetes mellitus without complication ([place])   . COPD (chronic obstructive pulmonary disease) ([place])   . Hypercholesteremia unk  . Coronary artery disease   . Stroke ([place])   . Sleep apnea     does not use CPAP regularly.  . Sarcoidosis [facility])     Past Surgical History  Procedure Laterality Date  . Appendectomy      There were no vitals filed for this visit.  Visit Diagnosis: Dysphonia      Subjective Assessment - 04/01/15 0945    Subjective Patient returns with reports that he continues to have difficulty with his voice and breathing.   Currently in Pain? No/denies               ADULT SLP TREATMENT - 04/01/15 0001    General Information   Behavior/Cognition Alert;Cooperative;Pleasant mood   Treatment Provided   Treatment provided Cognitive-Linquistic   Pain Assessment   Pain Assessment No/denies pain   Cognitive-Linquistic Treatment   Treatment focused on Voice   Skilled Treatment Patient was 90% accurate for using abdominal breathing strategies with mod cues. Patient was 80% accurate for using a forward, resonant voice at the word level with max cues. Patient was 100% accurate for using head and neck stretches to relax the vocal and speech mechanisms.   Assessment / Recommendations / Plan   Plan Continue with current  plan of care   Progression Toward Goals   Progression toward goals Progressing toward goals          SLP Education - 04/01/15 0946    Education provided Yes   Education Details abdominal breathing and resonance exercises   Person(s) Educated Patient   Methods Explanation;Demonstration;Handout   Comprehension Verbalized understanding;Returned demonstration            SLP Long Term Goals - 03/25/15 1328    SLP LONG TERM GOAL #1   Title The patient will demonstrate independent understanding of vocal hygiene concepts and neck, shoulder, lingual stretching exercises.   SLP LONG TERM GOAL #2   Title The patient will be independent for abdominal breathing and breath support exercises.   SLP LONG TERM GOAL #3   Title The patient will minimize vocal tension via Yawn-Sigh approach (or comparable technique) with min SLP cues with 80% accuracy.   SLP LONG TERM GOAL #4   Title The patient will maintain relaxed phonation / oral resonance for paragraph length recitation with 80% accuracy.   SLP LONG TERM GOAL #5   Title The patient will maximize voice quality and loudness using breath support for paragraph length recitation with 80% accuracy.          Plan - 04/01/15 0946    Clinical Impression Statement Patient was stimulable today for abdominal breathing and resonance  exercises. 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 Patient will work on breathing and resonance exercises at home.   Speech Therapy Frequency 2x / week   Duration Other (comment)  8 weeks   Treatment/Interventions Other (comment)  voice therapy   Potential to Achieve Goals Good   Potential Considerations Ability to learn/carryover information;Severity of impairments   SLP Home Exercise Plan abdominal breathing, resonance, and stretching exercises   Consulted and Agree with Plan of Care Patient         Problem List Patient Active Problem List   Diagnosis Date Noted  . Dental caries 03/08/2015  . Morbid obesity ([place]) 02/21/2015  . Obstructive sleep apnea 02/21/2015  . Thrombocytopenia ([place]) 02/21/2015  . Hypernatremia 02/21/2015  . COPD exacerbation ([place]) 02/11/2015  . Diabetes ([place]) 10/19/2014  . Chronic diastolic heart failure ([place]) 10/18/2014  . Chronic obstructive pulmonary disease ([place]) 10/18/2014  . Sarcoidosis ([place]) 04/22/2013  . Other and unspecified hyperlipidemia 04/22/2013    Wynelle Cleveland 04/01/2015, 9:56 AM  [place] MAIN [facility] SERVICES [address] [place], [place], [address] Phone: [phone]   Fax:  [phone]   Name: MERVEL SPEYRER MRN: [id number] Date of Birth: 10-Sep-1970

## 2015-04-05 ENCOUNTER — Ambulatory Visit: Payer: Medicare Other | Admitting: Speech Pathology

## 2015-04-07 ENCOUNTER — Ambulatory Visit: Payer: Medicare Other | Attending: Cardiology | Admitting: Speech Pathology

## 2015-04-07 DIAGNOSIS — R490 Dysphonia: Secondary | ICD-10-CM | POA: Insufficient documentation

## 2015-04-12 ENCOUNTER — Ambulatory Visit: Payer: Medicare Other | Admitting: Speech Pathology

## 2015-04-13 ENCOUNTER — Encounter: Payer: Self-pay | Admitting: Speech Pathology

## 2015-04-13 ENCOUNTER — Ambulatory Visit: Payer: Medicare Other | Admitting: Speech Pathology

## 2015-04-13 DIAGNOSIS — R490 Dysphonia: Secondary | ICD-10-CM | POA: Diagnosis not present

## 2015-04-13 NOTE — Therapy (Signed)
[place] MAIN [facility] SERVICES [address] [place], [place], [address] Phone: [phone]   Fax:  [phone]  Speech Language Pathology Treatment  Patient Details  Name: [name] MRN: [id number] Date of Birth: November 18, 1970 Referring Provider: Dr. Gwynneth Aliment  Encounter Date: 04/13/2015      End of Session - 04/13/15 1338    Visit Number 3   Number of Visits 17   Date for SLP Re-Evaluation 05/19/15   SLP Start Time 1115   SLP Stop Time  1200   SLP Time Calculation (min) 45 min   Activity Tolerance Patient tolerated treatment well      Past Medical History  Diagnosis Date  . Hypertension   . Diabetes mellitus without complication ([place])   . COPD (chronic obstructive pulmonary disease) ([place])   . Hypercholesteremia unk  . Coronary artery disease   . Stroke ([place])   . Sleep apnea     does not use CPAP regularly.  . Sarcoidosis [facility])     Past Surgical History  Procedure Laterality Date  . Appendectomy      There were no vitals filed for this visit.  Visit Diagnosis: Dysphonia      Subjective Assessment - 04/13/15 1337    Subjective Patient returns with reports that he continues to have difficulty with his voice and breathing.   Currently in Pain? No/denies               ADULT SLP TREATMENT - 04/13/15 0001    General Information   Behavior/Cognition Alert;Cooperative;Pleasant mood   Treatment Provided   Treatment provided Cognitive-Linquistic   Pain Assessment   Pain Assessment No/denies pain   Cognitive-Linquistic Treatment   Treatment focused on Voice   Skilled Treatment Patient was 80% accurate for using abdominal breathing strategies with max cues. Patient was 80% accurate for using a forward, resonant voice with continuous phonation at the word level with max cues. Patient was 100% accurate for using head and neck stretches to relax the vocal and speech mechanisms. Patient was 65% accurate for demonstrating  proper technique of exercises used to balance and strengthen the laryngeal musculature (SOVT straw exercises) given max cues.   Assessment / Recommendations / Plan   Plan Continue with current plan of care   Progression Toward Goals   Progression toward goals Progressing toward goals          SLP Education - 04/13/15 1337    Education provided Yes   Education Details abdominal breathing, resonance exercises, SOVT exercises   Person(s) Educated Patient   Methods Explanation;Demonstration;Handout   Comprehension Verbalized understanding;Returned demonstration;Need further instruction            SLP Long Term Goals - 03/25/15 1328    SLP LONG TERM GOAL #1   Title The patient will demonstrate independent understanding of vocal hygiene concepts and neck, shoulder, lingual stretching exercises.   SLP LONG TERM GOAL #2   Title The patient will be independent for abdominal breathing and breath support exercises.   SLP LONG TERM GOAL #3   Title The patient will minimize vocal tension via Yawn-Sigh approach (or comparable technique) with min SLP cues with 80% accuracy.   SLP LONG TERM GOAL #4   Title The patient will maintain relaxed phonation / oral resonance for paragraph length recitation with 80% accuracy.   SLP LONG TERM GOAL #5   Title The patient will maximize voice quality and loudness using breath support for paragraph length recitation with  80% accuracy.          Plan - 04/13/15 1338    Clinical Impression Statement Patient returns after missing the last several sessions. Today's session focused on review of abdominal breathing and resonant voice, and introduction of semi-occluded vocal tract (SOVT) exercises. Patient was stimulable today for abdominal breathing, resonance exercises, and SOVT straw exercises. 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 Patient will work  on breathing, resonance, and SOVT exercises at home.   Speech Therapy Frequency 2x / week   Duration Other (comment)  8 weeks   Treatment/Interventions Other (comment)  voice therapy   Potential to Achieve Goals Good   Potential Considerations Ability to learn/carryover information;Severity of impairments   SLP Home Exercise Plan abdominal breathing, resonance, SOVT, and stretching exercises   Consulted and Agree with Plan of Care Patient        Problem List Patient Active Problem List   Diagnosis Date Noted  . Dental caries 03/08/2015  . Morbid obesity ([place]) 02/21/2015  . Obstructive sleep apnea 02/21/2015  . Thrombocytopenia ([place]) 02/21/2015  . Hypernatremia 02/21/2015  . COPD exacerbation ([place]) 02/11/2015  . Diabetes ([place]) 10/19/2014  . Chronic diastolic heart failure ([place]) 10/18/2014  . Chronic obstructive pulmonary disease ([place]) 10/18/2014  . Sarcoidosis ([place]) 04/22/2013  . Other and unspecified hyperlipidemia 04/22/2013    Wynelle Cleveland 04/13/2015, 1:39 PM  [place] MAIN [facility] SERVICES [address] [place], [place], [address] Phone: [phone]   Fax:  [phone]   Name: [name] MRN: [id number] Date of Birth: [birth year]

## 2015-04-15 ENCOUNTER — Ambulatory Visit: Payer: Medicare Other | Admitting: Speech Pathology

## 2015-04-15 ENCOUNTER — Encounter: Payer: Self-pay | Admitting: Speech Pathology

## 2015-04-15 DIAGNOSIS — R490 Dysphonia: Secondary | ICD-10-CM | POA: Diagnosis not present

## 2015-04-15 NOTE — Therapy (Signed)
[name] [facility] SERVICES [address] [place], [place], [address] Phone: [phone]   Fax:  [phone]  Speech Language Pathology Treatment  Patient Details  Name: [name] MRN: [id number] Date of Birth: 08-Aug-1970 Referring Provider: Dr. Gwynneth Aliment  Encounter Date: 04/15/2015      End of Session - 04/15/15 1256    Visit Number 4   Number of Visits 17   Date for SLP Re-Evaluation 05/19/15   SLP Start Time 1110   SLP Stop Time  1200   SLP Time Calculation (min) 50 min   Activity Tolerance Patient tolerated treatment well      Past Medical History  Diagnosis Date  . Hypertension   . Diabetes mellitus without complication ([place])   . COPD (chronic obstructive pulmonary disease) ([place])   . Hypercholesteremia unk  . Coronary artery disease   . Stroke ([place])   . Sleep apnea     does not use CPAP regularly.  . Sarcoidosis [facility])     Past Surgical History  Procedure Laterality Date  . Appendectomy      There were no vitals filed for this visit.  Visit Diagnosis: Dysphonia      Subjective Assessment - 04/15/15 1255    Subjective Patient returns with reports that his voice has improved and that he is having an easier time controlling his breathing.   Currently in Pain? No/denies               ADULT SLP TREATMENT - 04/15/15 0001    General Information   Behavior/Cognition Alert;Cooperative;Pleasant mood   Treatment Provided   Treatment provided Cognitive-Linquistic   Pain Assessment   Pain Assessment No/denies pain   Cognitive-Linquistic Treatment   Treatment focused on Voice   Skilled Treatment Patient was 80% accurate for using abdominal breathing strategies with max cues from the clinician to provide appropriate respiratory support for speech. Patient was 80% accurate for using a forward, resonant voice with continuous phonation at the word level with mod cues. Patient was 70% accurate for using a forward,  resonant voice at the word level with max cues. Patient was 70% accurate for using easy-onset phonation to reduce strain.    Assessment / Recommendations / Plan   Plan Continue with current plan of care   Progression Toward Goals   Progression toward goals Progressing toward goals          SLP Education - 04/15/15 1256    Education provided Yes   Education Details resonance; easy onset phonation; abdominal breathing   Person(s) Educated Patient   Methods Explanation;Demonstration;Handout   Comprehension Verbalized understanding;Returned demonstration;Need further instruction            SLP Long Term Goals - 03/25/15 1328    SLP LONG TERM GOAL #1   Title The patient will demonstrate independent understanding of vocal hygiene concepts and neck, shoulder, lingual stretching exercises.   SLP LONG TERM GOAL #2   Title The patient will be independent for abdominal breathing and breath support exercises.   SLP LONG TERM GOAL #3   Title The patient will minimize vocal tension via Yawn-Sigh approach (or comparable technique) with min SLP cues with 80% accuracy.   SLP LONG TERM GOAL #4   Title The patient will maintain relaxed phonation / oral resonance for paragraph length recitation with 80% accuracy.   SLP LONG TERM GOAL #5   Title The patient will maximize voice quality and loudness using breath support for paragraph  length recitation with 80% accuracy.          Plan - 04/15/15 1256    Clinical Impression Statement Patient continues to be stimulable for using a forward, resonant voice with cues to elongate nasals. Today, increasing loudness proved to be an effective way to achieve a clearer voice with a reduction in hoarseness. The Patient commented that when he speaks at a faster rate, his speech feels more natural. The more "natural" style of speaking served to decrease hoarseness as well. Continue to focus on abdominal breathing for breath support, resonant voice, loudness, and  easy onset phonation.   Speech Therapy Frequency 2x / week   Duration Other (comment)  8 weeks   Treatment/Interventions Other (comment)  voice therapy   Potential to Achieve Goals Good   Potential Considerations Ability to learn/carryover information;Severity of impairments   SLP Home Exercise Plan abdominal breathing, resonance, easy onset phonation   Consulted and Agree with Plan of Care Patient        Problem List Patient Active Problem List   Diagnosis Date Noted  . Dental caries 03/08/2015  . Morbid obesity ([place]) 02/21/2015  . Obstructive sleep apnea 02/21/2015  . Thrombocytopenia ([place]) 02/21/2015  . Hypernatremia 02/21/2015  . COPD exacerbation ([place]) 02/11/2015  . Diabetes ([place]) 10/19/2014  . Chronic diastolic heart failure ([place]) 10/18/2014  . Chronic obstructive pulmonary disease ([place]) 10/18/2014  . Sarcoidosis ([place]) 04/22/2013  . Other and unspecified hyperlipidemia 04/22/2013    Wynelle Cleveland 04/15/2015, 12:57 PM  [place] [name] [facility] SERVICES [address] [place], [place], [address] Phone: [phone]   Fax:  [phone]   Name: [name] MRN: [id number] Date of Birth: March 06, 1970

## 2015-04-19 ENCOUNTER — Ambulatory Visit: Payer: Medicare Other | Admitting: Speech Pathology

## 2015-04-22 ENCOUNTER — Ambulatory Visit: Payer: Medicare Other | Admitting: Speech Pathology

## 2015-04-26 ENCOUNTER — Encounter: Payer: Self-pay | Admitting: Speech Pathology

## 2015-04-26 ENCOUNTER — Ambulatory Visit: Payer: Medicare Other | Admitting: Speech Pathology

## 2015-04-26 DIAGNOSIS — R490 Dysphonia: Secondary | ICD-10-CM | POA: Diagnosis not present

## 2015-04-26 NOTE — Therapy (Signed)
[place] MAIN [facility] SERVICES [address] [place], [place], [address] Phone: [phone]   Fax:  [phone]  Speech Language Pathology Treatment  Patient Details  Name: [name] MRN: [id number] Date of Birth: 1970/12/13 Referring Provider: Dr. Gwynneth Aliment  Encounter Date: 04/26/2015      End of Session - 04/26/15 1346    Visit Number 5   Number of Visits 17   Date for SLP Re-Evaluation 05/19/15   SLP Start Time 1125   SLP Stop Time  1200   SLP Time Calculation (min) 35 min   Activity Tolerance Patient tolerated treatment well      Past Medical History  Diagnosis Date  . Hypertension   . Diabetes mellitus without complication ([place])   . COPD (chronic obstructive pulmonary disease) ([place])   . Hypercholesteremia unk  . Coronary artery disease   . Stroke ([place])   . Sleep apnea     does not use CPAP regularly.  . Sarcoidosis [facility])     Past Surgical History  Procedure Laterality Date  . Appendectomy      There were no vitals filed for this visit.  Visit Diagnosis: Dysphonia      Subjective Assessment - 04/26/15 1345    Subjective Patient returns with reports that his voice "keeps getting better and better."   Currently in Pain? No/denies               ADULT SLP TREATMENT - 04/26/15 0001    General Information   Behavior/Cognition Alert;Cooperative;Pleasant mood   Treatment Provided   Treatment provided Cognitive-Linquistic   Pain Assessment   Pain Assessment No/denies pain   Cognitive-Linquistic Treatment   Treatment focused on Voice   Skilled Treatment Patient was 80% accurate for using abdominal breathing strategies with mod cues from the clinician to provide appropriate respiratory support for speech. Patient was 70% accurate for using a forward, resonant voice with continuous phonation at the phrase level with mod cues. Patient was 70% accurate for using yawn-sigh technique to reduce strain.    Assessment /  Recommendations / Plan   Plan Continue with current plan of care   Progression Toward Goals   Progression toward goals Progressing toward goals          SLP Education - 04/26/15 1346    Education provided Yes   Education Details resonance; abdominal breathing; vocal hygiene   Person(s) Educated Patient   Methods Explanation;Demonstration   Comprehension Verbalized understanding;Returned demonstration;Need further instruction            SLP Long Term Goals - 03/25/15 1328    SLP LONG TERM GOAL #1   Title The patient will demonstrate independent understanding of vocal hygiene concepts and neck, shoulder, lingual stretching exercises.   SLP LONG TERM GOAL #2   Title The patient will be independent for abdominal breathing and breath support exercises.   SLP LONG TERM GOAL #3   Title The patient will minimize vocal tension via Yawn-Sigh approach (or comparable technique) with min SLP cues with 80% accuracy.   SLP LONG TERM GOAL #4   Title The patient will maintain relaxed phonation / oral resonance for paragraph length recitation with 80% accuracy.   SLP LONG TERM GOAL #5   Title The patient will maximize voice quality and loudness using breath support for paragraph length recitation with 80% accuracy.          Plan - 04/26/15 1346    Clinical Impression Statement Patient continues  to be stimulable for using a forward, resonant voice with cues to elongate nasals. Today, increasing loudness and yawn-sigh proved to be effective ways to achieve a clearer voice with a reduction in hoarseness. Continue to focus on abdominal breathing for breath support, resonant voice, loudness, and introduce laryngeal strengthening exercises for next session.   Speech Therapy Frequency 2x / week   Duration Other (comment)  8 weeks   Treatment/Interventions Other (comment)  voice therapy   Potential to Achieve Goals Good   Potential Considerations Ability to learn/carryover information;Severity of  impairments   SLP Home Exercise Plan abdominal breathing, resonance   Consulted and Agree with Plan of Care Patient        Problem List Patient Active Problem List   Diagnosis Date Noted  . Dental caries 03/08/2015  . Morbid obesity ([place]) 02/21/2015  . Obstructive sleep apnea 02/21/2015  . Thrombocytopenia ([place]) 02/21/2015  . Hypernatremia 02/21/2015  . COPD exacerbation ([place]) 02/11/2015  . Diabetes ([place]) 10/19/2014  . Chronic diastolic heart failure ([place]) 10/18/2014  . Chronic obstructive pulmonary disease ([place]) 10/18/2014  . Sarcoidosis ([place]) 04/22/2013  . Other and unspecified hyperlipidemia 04/22/2013    Wynelle Cleveland 04/26/2015, 1:48 PM  [place] MAIN [facility] SERVICES [address] [place], [place], [address] Phone: [phone]   Fax:  [phone]   Name: [name] MRN: [id number] Date of Birth: Dec 04, 1970

## 2015-04-29 ENCOUNTER — Ambulatory Visit: Payer: Medicare Other | Admitting: Speech Pathology

## 2015-05-03 ENCOUNTER — Ambulatory Visit: Payer: Medicare Other | Admitting: Speech Pathology

## 2015-05-03 ENCOUNTER — Encounter: Payer: Self-pay | Admitting: Speech Pathology

## 2015-05-03 DIAGNOSIS — R490 Dysphonia: Secondary | ICD-10-CM | POA: Diagnosis not present

## 2015-05-03 NOTE — Therapy (Signed)
[place] MAIN [facility] SERVICES [address] [place], [place], [address] Phone: [phone]   Fax:  [phone]  Speech Language Pathology Treatment  Patient Details  Name: DRAYVEN GRILL MRN: [id number] Date of Birth: 12-14-1970 Referring Provider: Dr. Gwynneth Aliment  Encounter Date: 05/03/2015      End of Session - 05/03/15 1330    Visit Number 6   Number of Visits 17   Date for SLP Re-Evaluation 05/19/15   SLP Start Time 1125   SLP Stop Time  1200   SLP Time Calculation (min) 35 min   Activity Tolerance Patient tolerated treatment well      Past Medical History  Diagnosis Date  . Hypertension   . Diabetes mellitus without complication ([place])   . COPD (chronic obstructive pulmonary disease) ([place])   . Hypercholesteremia unk  . Coronary artery disease   . Stroke ([place])   . Sleep apnea     does not use CPAP regularly.  . Sarcoidosis [facility])     Past Surgical History  Procedure Laterality Date  . Appendectomy      There were no vitals filed for this visit.  Visit Diagnosis: Dysphonia      Subjective Assessment - 05/03/15 1329    Subjective Patient returns with reports that his "voice is doing well."   Currently in Pain? No/denies               ADULT SLP TREATMENT - 05/03/15 0001    General Information   Behavior/Cognition Alert;Cooperative;Pleasant mood   Treatment Provided   Treatment provided Cognitive-Linquistic   Pain Assessment   Pain Assessment No/denies pain   Cognitive-Linquistic Treatment   Treatment focused on Voice   Skilled Treatment Patient was 50% accurate for using laryngeal strengthening and balancing exercises (lip trills and straw phonation) to improve vocal quality. Patient was 80% accurate for using abdominal breathing strategies with mod cues from the clinician to provide appropriate respiratory support for speech. Patient was 70% accurate for using a forward, resonant voice with continuous phonation  at the word level with mod cues.    Assessment / Recommendations / Plan   Plan Continue with current plan of care   Progression Toward Goals   Progression toward goals Progressing toward goals          SLP Education - 05/03/15 1329    Education provided Yes   Education Details lip trills; SOVT   Person(s) Educated Patient   Methods Explanation;Demonstration   Comprehension Verbalized understanding;Returned demonstration            SLP Long Term Goals - 03/25/15 1328    SLP LONG TERM GOAL #1   Title The patient will demonstrate independent understanding of vocal hygiene concepts and neck, shoulder, lingual stretching exercises.   SLP LONG TERM GOAL #2   Title The patient will be independent for abdominal breathing and breath support exercises.   SLP LONG TERM GOAL #3   Title The patient will minimize vocal tension via Yawn-Sigh approach (or comparable technique) with min SLP cues with 80% accuracy.   SLP LONG TERM GOAL #4   Title The patient will maintain relaxed phonation / oral resonance for paragraph length recitation with 80% accuracy.   SLP LONG TERM GOAL #5   Title The patient will maximize voice quality and loudness using breath support for paragraph length recitation with 80% accuracy.          Plan - 05/03/15 1330    Clinical Impression  Statement Laryngeal strengthening and balancing exercises (lip trills and straw phonation) were introduced today. Patient was stimulable for straw phonation and somewhat stimulable for lip trills. The straw phonation technique shows the greatest promise to strengthen and balance the laryngeal musculature, but the Patient plans on working on lip trills at home. Patient continues to be stimulable for using a forward, resonant voice with cues to elongate nasals. Increasing loudness and yawn-sigh continue to be effective ways to achieve a clearer voice with a reduction in hoarseness. Continue to focus on abdominal breathing for breath  support, resonant voice, loudness, and laryngeal strengthening exercises for next session.   Speech Therapy Frequency 2x / week   Duration Other (comment)  8 weeks   Treatment/Interventions Other (comment)  voice therapy   Potential to Achieve Goals Good   Potential Considerations Ability to learn/carryover information;Severity of impairments   SLP Home Exercise Plan lip trills; SOVT   Consulted and Agree with Plan of Care Patient        Problem List Patient Active Problem List   Diagnosis Date Noted  . Dental caries 03/08/2015  . Morbid obesity ([place]) 02/21/2015  . Obstructive sleep apnea 02/21/2015  . Thrombocytopenia ([place]) 02/21/2015  . Hypernatremia 02/21/2015  . COPD exacerbation ([place]) 02/11/2015  . Diabetes ([place]) 10/19/2014  . Chronic diastolic heart failure ([place]) 10/18/2014  . Chronic obstructive pulmonary disease ([place]) 10/18/2014  . Sarcoidosis ([place]) 04/22/2013  . Other and unspecified hyperlipidemia 04/22/2013    Wynelle Cleveland 05/03/2015, 1:31 PM  [place] MAIN [facility] SERVICES [address] [place], [place], [address] Phone: [phone]   Fax:  [phone]   Name: JELON ONDO MRN: [id number] Date of Birth: [birth year]

## 2015-05-06 ENCOUNTER — Encounter: Payer: Self-pay | Admitting: Speech Pathology

## 2015-05-06 ENCOUNTER — Ambulatory Visit: Payer: Medicare Other | Admitting: Speech Pathology

## 2015-05-06 DIAGNOSIS — R490 Dysphonia: Secondary | ICD-10-CM

## 2015-05-06 NOTE — Therapy (Signed)
[place] MAIN [facility] SERVICES [address] [place], [place], [address] Phone: [phone]   Fax:  [phone]  Speech Language Pathology Treatment / Discharge Note  Patient Details  Name: [name] MRN: [id number] Date of Birth: 04-16-1970 Referring Provider: Dr. Gwynneth Aliment  Encounter Date: 05/06/2015      End of Session - 05/06/15 1331    Visit Number 7   Number of Visits 7   SLP Start Time 1115   SLP Stop Time  1200   SLP Time Calculation (min) 45 min   Activity Tolerance Patient tolerated treatment well      Past Medical History  Diagnosis Date  . Hypertension   . Diabetes mellitus without complication ([place])   . COPD (chronic obstructive pulmonary disease) ([place])   . Hypercholesteremia unk  . Coronary artery disease   . Stroke ([place])   . Sleep apnea     does not use CPAP regularly.  . Sarcoidosis [facility])     Past Surgical History  Procedure Laterality Date  . Appendectomy      There were no vitals filed for this visit.  Visit Diagnosis: Dysphonia      Subjective Assessment - 05/06/15 1330    Subjective Patient returns with reports that his "voice is doing alright."   Currently in Pain? No/denies               ADULT SLP TREATMENT - 05/06/15 0001    General Information   Behavior/Cognition Cooperative;Pleasant mood;Alert   Treatment Provided   Treatment provided Cognitive-Linquistic   Pain Assessment   Pain Assessment No/denies pain   Cognitive-Linquistic Treatment   Treatment focused on Voice   Skilled Treatment Patient was 50% accurate for using laryngeal strengthening and balancing exercises (vocal function exercises) to improve vocal quality. Patient was 80% accurate for using abdominal breathing strategies with mod cues from the clinician to provide appropriate respiratory support for speech. Patient was 70% accurate for using a forward, resonant voice with continuous phonation at the word level with mod  cues.    Assessment / Recommendations / Plan   Plan Discharge SLP treatment due to (comment)   Progression Toward Goals   Progression toward goals Goals met, education completed, patient discharged from SLP          SLP Education - 05/06/15 1330    Education provided Yes   Education Details vocal function exercises   Person(s) Educated Patient   Methods Explanation;Demonstration   Comprehension Verbalized understanding            SLP Long Term Goals - 05/06/15 1333    SLP LONG TERM GOAL #1   Title The patient will demonstrate independent understanding of vocal hygiene concepts and neck, shoulder, lingual stretching exercises.   Status Achieved   SLP LONG TERM GOAL #2   Title The patient will be independent for abdominal breathing and breath support exercises.   Status Achieved   SLP LONG TERM GOAL #3   Title The patient will minimize vocal tension via Yawn-Sigh approach (or comparable technique) with min SLP cues with 80% accuracy.   Status Partially Met   SLP LONG TERM GOAL #4   Title The patient will maintain relaxed phonation / oral resonance for paragraph length recitation with 80% accuracy.   Status Partially Met   SLP LONG TERM GOAL #5   Title The patient will maximize voice quality and loudness using breath support for paragraph length recitation with 80% accuracy.  Status Partially Met          Plan - 05/06/15 1332    Clinical Impression Statement Vocal Function Exercises were introduced today and Patient was somewhat stimulable. Patient continues to be stimulable for using a forward, resonant voice with cues to elongate nasals, but requires max cues to use the technique during spontaneous speech. Throughout our therapy sessions, Patient has demonstrated a lack of motivation to improve vocal quality and a lack of commitment to the home exercise plan. The Patient was consistently 15 to 25 minutes late to therapy sessions, and was a "no show" for several sessions.  The Patient declined to pursue further therapy sessions.    Speech Therapy Frequency 2x / week   Duration 4 weeks   Treatment/Interventions Other (comment)  voice therapy   Potential to Achieve Goals Good   Potential Considerations Ability to learn/carryover information;Severity of impairments   SLP Home Exercise Plan lip trills; SOVT   Consulted and Agree with Plan of Care Patient        Problem List Patient Active Problem List   Diagnosis Date Noted  . Dental caries 03/08/2015  . Morbid obesity ([facility]) 02/21/2015  . Obstructive sleep apnea 02/21/2015  . Thrombocytopenia ([facility]) 02/21/2015  . Hypernatremia 02/21/2015  . COPD exacerbation ([facility]) 02/11/2015  . Diabetes ([facility]) 10/19/2014  . Chronic diastolic heart failure ([facility]) 10/18/2014  . Chronic obstructive pulmonary disease ([facility]) 10/18/2014  . Sarcoidosis ([facility]) 04/22/2013  . Other and unspecified hyperlipidemia 04/22/2013    Joe Hall 05/06/2015, 1:34 PM  [place] [facility] REHAB SERVICES [address] [place], [place], [address] Phone: [phone]   Fax:  [phone]   Name: [name] MRN: [id number] Date of Birth: 04/06/1970   

## 2015-06-14 ENCOUNTER — Encounter: Payer: Self-pay | Admitting: Family

## 2015-06-14 ENCOUNTER — Ambulatory Visit: Payer: Medicare Other | Attending: Family | Admitting: Family

## 2015-06-14 VITALS — BP 108/62 | HR 74 | Resp 18 | Ht 72.0 in | Wt 291.0 lb

## 2015-06-14 DIAGNOSIS — I5032 Chronic diastolic (congestive) heart failure: Secondary | ICD-10-CM

## 2015-06-14 DIAGNOSIS — I2510 Atherosclerotic heart disease of native coronary artery without angina pectoris: Secondary | ICD-10-CM | POA: Diagnosis not present

## 2015-06-14 DIAGNOSIS — R0602 Shortness of breath: Secondary | ICD-10-CM | POA: Insufficient documentation

## 2015-06-14 DIAGNOSIS — Z79899 Other long term (current) drug therapy: Secondary | ICD-10-CM | POA: Insufficient documentation

## 2015-06-14 DIAGNOSIS — R5383 Other fatigue: Secondary | ICD-10-CM | POA: Insufficient documentation

## 2015-06-14 DIAGNOSIS — E7800 Pure hypercholesterolemia, unspecified: Secondary | ICD-10-CM | POA: Insufficient documentation

## 2015-06-14 DIAGNOSIS — D869 Sarcoidosis, unspecified: Secondary | ICD-10-CM | POA: Diagnosis not present

## 2015-06-14 DIAGNOSIS — Z87891 Personal history of nicotine dependence: Secondary | ICD-10-CM | POA: Insufficient documentation

## 2015-06-14 DIAGNOSIS — Z809 Family history of malignant neoplasm, unspecified: Secondary | ICD-10-CM | POA: Insufficient documentation

## 2015-06-14 DIAGNOSIS — Z880 Allergy status to penicillin: Secondary | ICD-10-CM | POA: Insufficient documentation

## 2015-06-14 DIAGNOSIS — I110 Hypertensive heart disease with heart failure: Secondary | ICD-10-CM | POA: Insufficient documentation

## 2015-06-14 DIAGNOSIS — Z7984 Long term (current) use of oral hypoglycemic drugs: Secondary | ICD-10-CM | POA: Diagnosis not present

## 2015-06-14 DIAGNOSIS — Z7982 Long term (current) use of aspirin: Secondary | ICD-10-CM | POA: Diagnosis not present

## 2015-06-14 DIAGNOSIS — E119 Type 2 diabetes mellitus without complications: Secondary | ICD-10-CM | POA: Insufficient documentation

## 2015-06-14 DIAGNOSIS — Z833 Family history of diabetes mellitus: Secondary | ICD-10-CM | POA: Insufficient documentation

## 2015-06-14 DIAGNOSIS — I10 Essential (primary) hypertension: Secondary | ICD-10-CM | POA: Insufficient documentation

## 2015-06-14 DIAGNOSIS — J449 Chronic obstructive pulmonary disease, unspecified: Secondary | ICD-10-CM | POA: Diagnosis not present

## 2015-06-14 DIAGNOSIS — G4733 Obstructive sleep apnea (adult) (pediatric): Secondary | ICD-10-CM

## 2015-06-14 DIAGNOSIS — Z8249 Family history of ischemic heart disease and other diseases of the circulatory system: Secondary | ICD-10-CM | POA: Diagnosis not present

## 2015-06-14 DIAGNOSIS — Z8673 Personal history of transient ischemic attack (TIA), and cerebral infarction without residual deficits: Secondary | ICD-10-CM | POA: Diagnosis not present

## 2015-06-14 DIAGNOSIS — Z794 Long term (current) use of insulin: Secondary | ICD-10-CM | POA: Diagnosis not present

## 2015-06-14 NOTE — Progress Notes (Signed)
Subjective:    Patient ID: [name], male    DOB: [birth year], 45 y.o.   MRN: [id number]  Congestive Heart Failure Presents for follow-up visit. The disease course has been improving. Associated symptoms include edema, fatigue and shortness of breath. Pertinent negatives include no abdominal pain, chest pain, chest pressure, orthopnea or palpitations. The symptoms have been improving. Past treatments include beta blockers, salt and fluid restriction and ACE inhibitors. The treatment provided moderate relief. Compliance with prior treatments has been good. His past medical history is significant for CAD, chronic lung disease, CVA, DM and HTN. He has one 1st degree relative with heart disease.  Hypertension This is a chronic problem. The current episode started more than 1 year ago. The problem is unchanged. The problem is controlled. Associated symptoms include peripheral edema and shortness of breath. Pertinent negatives include no chest pain, headaches, neck pain or palpitations. There are no associated agents to hypertension. Risk factors for coronary artery disease include diabetes mellitus, dyslipidemia, family history, obesity and male gender. Past treatments include ACE inhibitors, alpha 1 blockers, beta blockers, diuretics and lifestyle changes. The current treatment provides significant improvement. There are no compliance problems.  Hypertensive end-organ damage includes CAD/MI, CVA and heart failure.   Past Medical History  Diagnosis Date  . Hypertension   . Diabetes mellitus without complication ([place])   . COPD (chronic obstructive pulmonary disease) ([place])   . Hypercholesteremia unk  . Coronary artery disease   . Stroke ([place])   . Sleep apnea     does not use CPAP regularly.  . Sarcoidosis [facility])     Past Surgical History  Procedure Laterality Date  . Appendectomy      Family History  Problem Relation Age of Onset  . Hypertension Mother   . Heart disease Mother   .  Diabetes Mother   . Cancer Father     Social History  Substance Use Topics  . Smoking status: Former Smoker -- 0.33 packs/day    Types: Cigarettes    Quit date: 02/05/2015  . Smokeless tobacco: Never Used  . Alcohol Use: 0.0 oz/week    0 Standard drinks or equivalent per week     Comment: occassional    Allergies  Allergen Reactions  . Penicillins Anaphylaxis and Other (See Comments)    Unable to obtain enough information to answer additional questions about this medication.      Prior to Admission medications   Medication Sig Start Date End Date Taking? Authorizing Provider  albuterol (PROVENTIL) (2.5 MG/3ML) 0.083% nebulizer solution Take 2.5 mg by nebulization every 6 (six) hours as needed for wheezing or shortness of breath.   Yes Historical Provider, MD  aspirin 81 MG chewable tablet Chew 81 mg by mouth daily.   Yes Historical Provider, MD  atorvastatin (LIPITOR) 40 MG tablet Take 40 mg by mouth at bedtime.    Yes Historical Provider, MD  cloNIDine (CATAPRES - DOSED IN MG/24 HR) 0.2 mg/24hr patch Place 0.2 mg onto the skin once a week.   Yes Historical Provider, MD  Fluticasone-Salmeterol (ADVAIR) 250-50 MCG/DOSE AEPB Inhale 1 puff into the lungs 2 (two) times daily.   Yes Historical Provider, MD  furosemide (LASIX) 80 MG tablet Take 1 tablet (80 mg total) by mouth 2 (two) times daily. 03/15/15  Yes Alisa Graff, FNP  insulin detemir (LEVEMIR) 100 UNIT/ML injection Inject 40 Units into the skin at bedtime.    Yes Historical Provider, MD  insulin lispro (HUMALOG) 100 UNIT/ML  injection Inject 8 Units into the skin 3 (three) times daily with meals. Sliding scale as needed   Yes Historical Provider, MD  ipratropium-albuterol (DUONEB) 0.5-2.5 (3) MG/3ML SOLN Take 3 mLs by nebulization 4 (four) times daily. 02/21/15  Yes Theodoro Grist, MD  isosorbide dinitrate (ISORDIL) 10 MG tablet Take 1 tablet (10 mg total) by mouth 3 (three) times daily. 04/01/15  Yes Alisa Graff, FNP  lisinopril  (PRINIVIL,ZESTRIL) 10 MG tablet Take 10 mg by mouth daily.   Yes Historical Provider, MD  metFORMIN (GLUCOPHAGE-XR) 500 MG 24 hr tablet Take 500 mg by mouth 2 (two) times daily.    Yes Historical Provider, MD  metoprolol (LOPRESSOR) 100 MG tablet Take 100 mg by mouth 2 (two) times daily.   Yes Historical Provider, MD  potassium chloride SA (K-DUR,KLOR-CON) 20 MEQ tablet Take 1 tablet (20 mEq total) by mouth 2 (two) times daily. 04/01/15  Yes Alisa Graff, FNP  tiotropium (SPIRIVA) 18 MCG inhalation capsule Place 18 mcg into inhaler and inhale daily.   Yes Historical Provider, MD      Review of Systems  Constitutional: Positive for fatigue. Negative for appetite change.  HENT: Negative for congestion, postnasal drip and sore throat.   Eyes: Negative.   Respiratory: Positive for cough (intermittently) and shortness of breath. Negative for chest tightness and wheezing.   Cardiovascular: Positive for leg swelling (little bit). Negative for chest pain and palpitations.  Gastrointestinal: Negative for abdominal pain and abdominal distention.  Endocrine: Negative.   Genitourinary: Negative.   Musculoskeletal: Negative for back pain and neck pain.  Skin: Negative.   Allergic/Immunologic: Negative.   Neurological: Negative for dizziness, light-headedness and headaches.  Hematological: Negative for adenopathy. Does not bruise/bleed easily.  Psychiatric/Behavioral: Negative for sleep disturbance (wearing bipap mask nightly) and dysphoric mood. The patient is not nervous/anxious.        Objective:   Physical Exam  Constitutional: He is oriented to person, place, and time. He appears well-developed and well-nourished.  HENT:  Head: Normocephalic and atraumatic.  Eyes: Conjunctivae are normal. Pupils are equal, round, and reactive to light.  Neck: Normal range of motion. Neck supple.  Cardiovascular: Normal rate and regular rhythm.   Pulmonary/Chest: Effort normal. He has no wheezes. He has no  rales.  Abdominal: Soft. He exhibits no distension. There is no tenderness.  Musculoskeletal: He exhibits edema (trace amount pitting edema in bilateral ankles). He exhibits no tenderness.  Neurological: He is alert and oriented to person, place, and time.  Skin: Skin is warm and dry.  Psychiatric: He has a normal mood and affect. His behavior is normal. Thought content normal.  Nursing note and vitals reviewed.   BP 108/62 mmHg  Pulse 74  Resp 18  Ht 6' (1.829 m)  Wt 291 lb (131.997 kg)  BMI 39.46 kg/m2  SpO2 95%       Assessment & Plan:  1: Chronic heart failure with preserved ejection fraction- Patient presents with some fatigue and shortness of breath on an intermittent basis. He denied having symptoms upon walking into the office today. When he does experience symptoms, he will sit down to rest for a few minutes until his symptoms resolve. He continues to weigh himself daily and says that his home weight is gradually coming down. By our scale, he's lost 5 pounds since he was last here on 03/15/15. Reminded to call for an overnight weight gain of >2 pounds or a weekly weight gain of >5 pounds. He is quite  active and is going to the gym 3-4 days/week and exercises for 45 minutes and then lifts weights. Will plan on checking lab work at his next visit if it's not done by his PCP. 2: HTN- Blood pressure looks great. Continue medications at this time. 3: Obstructive sleep apnea- He says that he's sleeping well and wears his bipap on a nightly basis. Follows with pulmonologist regarding this and his COPD. 4: Diabetes- He says that his glucose last night before bedtime was 106. He says that he tests his glucose prior to every meal and then before going to bed. Follows with his PCP regarding his diabetes.  He asks whether the keto diet is safe for him to do as he says that his wife is going to be doing it as well. Discussed the importance of monitoring glucose levels and, ideally, testing urine  for ketones to make sure everything is ok. Also discussed that he can not increase his fluid intake to the gallon or more that they recommend nor can he increase his sodium intake as they also recommend. Discussed the possible beneficial effects of decreasing carbs and eating more fresh fruits etc but to be mindful of his glucose levels.  Patient did not bring his medications nor a list. Each medication was verbally reviewed with the patient and he was encouraged to bring the bottles to every visit to confirm accuracy of list.  Return in 3 months or sooner for any questions/problems before then.

## 2015-06-14 NOTE — Patient Instructions (Signed)
Continue weighing daily and call for an overnight weight gain of > 2 pounds or a weekly weight gain of >5 pounds. 

## 2015-09-14 ENCOUNTER — Ambulatory Visit: Payer: Medicare Other | Admitting: Family

## 2015-09-15 ENCOUNTER — Ambulatory Visit: Payer: Medicare Other | Attending: Family | Admitting: Family

## 2015-09-15 ENCOUNTER — Encounter: Payer: Self-pay | Admitting: Family

## 2015-09-15 VITALS — BP 134/82 | HR 76 | Resp 18 | Ht 72.0 in | Wt 299.0 lb

## 2015-09-15 DIAGNOSIS — E669 Obesity, unspecified: Secondary | ICD-10-CM | POA: Insufficient documentation

## 2015-09-15 DIAGNOSIS — Z809 Family history of malignant neoplasm, unspecified: Secondary | ICD-10-CM | POA: Insufficient documentation

## 2015-09-15 DIAGNOSIS — I110 Hypertensive heart disease with heart failure: Secondary | ICD-10-CM | POA: Diagnosis not present

## 2015-09-15 DIAGNOSIS — E7800 Pure hypercholesterolemia, unspecified: Secondary | ICD-10-CM | POA: Insufficient documentation

## 2015-09-15 DIAGNOSIS — Z9889 Other specified postprocedural states: Secondary | ICD-10-CM | POA: Diagnosis not present

## 2015-09-15 DIAGNOSIS — Z8249 Family history of ischemic heart disease and other diseases of the circulatory system: Secondary | ICD-10-CM | POA: Diagnosis not present

## 2015-09-15 DIAGNOSIS — Z7982 Long term (current) use of aspirin: Secondary | ICD-10-CM | POA: Insufficient documentation

## 2015-09-15 DIAGNOSIS — I10 Essential (primary) hypertension: Secondary | ICD-10-CM

## 2015-09-15 DIAGNOSIS — I509 Heart failure, unspecified: Secondary | ICD-10-CM | POA: Diagnosis not present

## 2015-09-15 DIAGNOSIS — Z8673 Personal history of transient ischemic attack (TIA), and cerebral infarction without residual deficits: Secondary | ICD-10-CM | POA: Insufficient documentation

## 2015-09-15 DIAGNOSIS — Z79899 Other long term (current) drug therapy: Secondary | ICD-10-CM | POA: Insufficient documentation

## 2015-09-15 DIAGNOSIS — J449 Chronic obstructive pulmonary disease, unspecified: Secondary | ICD-10-CM | POA: Insufficient documentation

## 2015-09-15 DIAGNOSIS — G4733 Obstructive sleep apnea (adult) (pediatric): Secondary | ICD-10-CM

## 2015-09-15 DIAGNOSIS — Z6841 Body Mass Index (BMI) 40.0 and over, adult: Secondary | ICD-10-CM | POA: Diagnosis not present

## 2015-09-15 DIAGNOSIS — Z880 Allergy status to penicillin: Secondary | ICD-10-CM | POA: Diagnosis not present

## 2015-09-15 DIAGNOSIS — Z794 Long term (current) use of insulin: Secondary | ICD-10-CM

## 2015-09-15 DIAGNOSIS — I5032 Chronic diastolic (congestive) heart failure: Secondary | ICD-10-CM

## 2015-09-15 DIAGNOSIS — Z833 Family history of diabetes mellitus: Secondary | ICD-10-CM | POA: Insufficient documentation

## 2015-09-15 DIAGNOSIS — D869 Sarcoidosis, unspecified: Secondary | ICD-10-CM | POA: Diagnosis not present

## 2015-09-15 DIAGNOSIS — F17210 Nicotine dependence, cigarettes, uncomplicated: Secondary | ICD-10-CM | POA: Diagnosis not present

## 2015-09-15 DIAGNOSIS — E119 Type 2 diabetes mellitus without complications: Secondary | ICD-10-CM | POA: Insufficient documentation

## 2015-09-15 DIAGNOSIS — I2510 Atherosclerotic heart disease of native coronary artery without angina pectoris: Secondary | ICD-10-CM | POA: Diagnosis not present

## 2015-09-15 NOTE — Progress Notes (Signed)
Subjective:    Patient ID: [name], male    DOB: 09-21-1970, 45 y.o.   MRN: [id number]  Congestive Heart Failure  Presents for follow-up visit. Associated symptoms include edema, fatigue and shortness of breath (sometimes). Pertinent negatives include no abdominal pain, chest pain, orthopnea or palpitations. The symptoms have been stable.  Hypertension  This is a chronic problem. The current episode started more than 1 year ago. The problem is unchanged. The problem is controlled. Associated symptoms include peripheral edema ("little bit") and shortness of breath (sometimes). Pertinent negatives include no chest pain, neck pain or palpitations. There are no associated agents to hypertension. Risk factors for coronary artery disease include family history, obesity, male gender, diabetes mellitus and dyslipidemia. Past treatments include ACE inhibitors, beta blockers, diuretics and lifestyle changes. The current treatment provides significant improvement. There are no compliance problems.  Hypertensive end-organ damage includes CAD/MI, CVA and heart failure.    Past Medical History:  Diagnosis Date  . COPD (chronic obstructive pulmonary disease) ([place])   . Coronary artery disease   . Diabetes mellitus without complication ([place])   . Hypercholesteremia unk  . Hypertension   . Sarcoidosis ([place])   . Sleep apnea    does not use CPAP regularly.  . Stroke [facility])     Past Surgical History:  Procedure Laterality Date  . APPENDECTOMY      Family History  Problem Relation Age of Onset  . Hypertension Mother   . Heart disease Mother   . Diabetes Mother   . Cancer Father     Social History  Substance Use Topics  . Smoking status: Former Smoker    Packs/day: 0.33    Types: Cigarettes    Quit date: 02/05/2015  . Smokeless tobacco: Never Used  . Alcohol use 0.0 oz/week     Comment: occassional    Allergies  Allergen Reactions  . Penicillins Anaphylaxis and Other (See Comments)     Unable to obtain enough information to answer additional questions about this medication.      Prior to Admission medications   Medication Sig Start Date End Date Taking? Authorizing Provider  aspirin 81 MG chewable tablet Chew 81 mg by mouth daily.   Yes Historical Provider, MD  atorvastatin (LIPITOR) 40 MG tablet Take 40 mg by mouth at bedtime.    Yes Historical Provider, MD  Fluticasone-Salmeterol (ADVAIR) 250-50 MCG/DOSE AEPB Inhale 1 puff into the lungs 2 (two) times daily.   Yes Historical Provider, MD  furosemide (LASIX) 80 MG tablet Take 1 tablet (80 mg total) by mouth 2 (two) times daily. 03/15/15  Yes Alisa Graff, FNP  insulin detemir (LEVEMIR) 100 UNIT/ML injection Inject 40 Units into the skin at bedtime.    Yes Historical Provider, MD  insulin lispro (HUMALOG) 100 UNIT/ML injection Inject 8 Units into the skin 3 (three) times daily with meals. Sliding scale as needed   Yes Historical Provider, MD  ipratropium-albuterol (DUONEB) 0.5-2.5 (3) MG/3ML SOLN Take 3 mLs by nebulization 4 (four) times daily. 02/21/15  Yes Theodoro Grist, MD  isosorbide dinitrate (ISORDIL) 10 MG tablet Take 1 tablet (10 mg total) by mouth 3 (three) times daily. 04/01/15  Yes Alisa Graff, FNP  lisinopril (PRINIVIL,ZESTRIL) 10 MG tablet Take 10 mg by mouth daily.   Yes Historical Provider, MD  metFORMIN (GLUCOPHAGE-XR) 500 MG 24 hr tablet Take 500 mg by mouth 2 (two) times daily.    Yes Historical Provider, MD  metoprolol (LOPRESSOR) 100 MG tablet  Take 100 mg by mouth 2 (two) times daily.   Yes Historical Provider, MD  potassium chloride SA (K-DUR,KLOR-CON) 20 MEQ tablet Take 1 tablet (20 mEq total) by mouth 2 (two) times daily. 04/01/15  Yes Alisa Graff, FNP  tiotropium (SPIRIVA) 18 MCG inhalation capsule Place 18 mcg into inhaler and inhale daily.   Yes Historical Provider, MD  albuterol (PROVENTIL) (2.5 MG/3ML) 0.083% nebulizer solution Take 2.5 mg by nebulization every 6 (six) hours as needed for  wheezing or shortness of breath.    Historical Provider, MD     Review of Systems  Constitutional: Positive for appetite change (varies) and fatigue.  HENT: Negative for congestion, rhinorrhea and sore throat.   Eyes: Negative.   Respiratory: Positive for cough and shortness of breath (sometimes). Negative for chest tightness and wheezing.   Cardiovascular: Positive for leg swelling. Negative for chest pain and palpitations.  Gastrointestinal: Negative for abdominal distention and abdominal pain.  Endocrine: Negative.   Genitourinary: Negative.   Musculoskeletal: Negative for back pain and neck pain.  Skin: Negative.   Allergic/Immunologic: Negative.   Neurological: Negative for dizziness and light-headedness.  Hematological: Negative for adenopathy. Does not bruise/bleed easily.  Psychiatric/Behavioral: Negative for dysphoric mood and sleep disturbance (wearing bipap nightly). The patient is not nervous/anxious.        Objective:   Physical Exam  Constitutional: He is oriented to person, place, and time. He appears well-developed and well-nourished.  HENT:  Head: Normocephalic and atraumatic.  Eyes: Conjunctivae are normal. Pupils are equal, round, and reactive to light.  Neck: Normal range of motion. Neck supple.  Cardiovascular: Normal rate and regular rhythm.   Pulmonary/Chest: Effort normal. He has no wheezes. He has no rales.  Abdominal: Soft. He exhibits no distension. There is no tenderness.  Musculoskeletal: He exhibits edema (trace amount pitting edema in bilateral lower legs). He exhibits no tenderness.  Neurological: He is alert and oriented to person, place, and time.  Skin: Skin is warm and dry.  Psychiatric: He has a normal mood and affect. His behavior is normal. Thought content normal.  Nursing note and vitals reviewed.   BP 134/82   Pulse 76   Resp 18   Ht 6' (1.829 m)   Wt 299 lb (135.6 kg)   SpO2 94%   BMI 40.55 kg/m        Assessment & Plan:   1: Chronic heart failure with preserved ejection fraction- Patient presents with fatigue and shortness of breath with moderate exertion (Class II). He denied being short of breath upon walking into the office today. When he does get tired, he will stop what he's doing to rest until his energy level returns. He has minimal swelling in his lower legs but says that he hasn't taken his diuretic yet today and will do so once he returns home. He continues to weigh himself daily and says that his weight has gone up because he wasn't exercising. Has recently returned back to the gym. Tried the [employer] but says that it was too restrictive and he just couldn't follow it. Is now just trying to eat less. By our scale, he's gained 8.6 pounds since he was last here on 06/14/15. Reminded to call for an overnight weight gain of >2 pounds or a weekly weight gain of >5 pounds. He does not add salt to his food and tries to follow a low sodium diet.  2: HTN- Blood pressure looks good today. Saw his PCP earlier today and  says that he got lab work drawn.  3: Obstructive sleep apnea- He says that he's wearing his bipap on a nightly basis. 4: Diabetes- Glucose last night was in the 80's last night. Again, just saw his PCP today and had lab work. Is concerned regarding some erectile dysfunction and he was encouraged to follow-up with his PCP as he may need a urology referral.   Patient did not bring his medications nor a list. Each medication was verbally reviewed with the patient and he was encouraged to bring the bottles to every visit to confirm accuracy of list.  Return in 3 months or sooner for any questions/problems before then.

## 2015-09-15 NOTE — Patient Instructions (Signed)
Continue weighing daily and call for an overnight weight gain of > 2 pounds or a weekly weight gain of >5 pounds. 

## 2015-11-17 ENCOUNTER — Inpatient Hospital Stay
Admission: EM | Admit: 2015-11-17 | Discharge: 2015-11-18 | DRG: 190 | Disposition: A | Discharge status: Home / Self Care | Payer: Medicare Other | Attending: Internal Medicine | Admitting: Internal Medicine

## 2015-11-17 ENCOUNTER — Encounter: Payer: Self-pay | Admitting: Emergency Medicine

## 2015-11-17 ENCOUNTER — Emergency Department: Payer: Medicare Other

## 2015-11-17 DIAGNOSIS — J440 Chronic obstructive pulmonary disease with acute lower respiratory infection: Secondary | ICD-10-CM | POA: Diagnosis present

## 2015-11-17 DIAGNOSIS — I2510 Atherosclerotic heart disease of native coronary artery without angina pectoris: Secondary | ICD-10-CM | POA: Diagnosis present

## 2015-11-17 DIAGNOSIS — Z8249 Family history of ischemic heart disease and other diseases of the circulatory system: Secondary | ICD-10-CM

## 2015-11-17 DIAGNOSIS — Z880 Allergy status to penicillin: Secondary | ICD-10-CM

## 2015-11-17 DIAGNOSIS — E872 Acidosis: Secondary | ICD-10-CM | POA: Diagnosis present

## 2015-11-17 DIAGNOSIS — J441 Chronic obstructive pulmonary disease with (acute) exacerbation: Secondary | ICD-10-CM | POA: Diagnosis present

## 2015-11-17 DIAGNOSIS — Z7982 Long term (current) use of aspirin: Secondary | ICD-10-CM | POA: Diagnosis not present

## 2015-11-17 DIAGNOSIS — G4733 Obstructive sleep apnea (adult) (pediatric): Secondary | ICD-10-CM | POA: Diagnosis present

## 2015-11-17 DIAGNOSIS — J9621 Acute and chronic respiratory failure with hypoxia: Secondary | ICD-10-CM | POA: Diagnosis present

## 2015-11-17 DIAGNOSIS — R0689 Other abnormalities of breathing: Secondary | ICD-10-CM

## 2015-11-17 DIAGNOSIS — I5042 Chronic combined systolic (congestive) and diastolic (congestive) heart failure: Secondary | ICD-10-CM | POA: Diagnosis not present

## 2015-11-17 DIAGNOSIS — E119 Type 2 diabetes mellitus without complications: Secondary | ICD-10-CM | POA: Diagnosis present

## 2015-11-17 DIAGNOSIS — I5032 Chronic diastolic (congestive) heart failure: Secondary | ICD-10-CM | POA: Diagnosis present

## 2015-11-17 DIAGNOSIS — Z794 Long term (current) use of insulin: Secondary | ICD-10-CM

## 2015-11-17 DIAGNOSIS — Z7984 Long term (current) use of oral hypoglycemic drugs: Secondary | ICD-10-CM

## 2015-11-17 DIAGNOSIS — E785 Hyperlipidemia, unspecified: Secondary | ICD-10-CM | POA: Diagnosis present

## 2015-11-17 DIAGNOSIS — I110 Hypertensive heart disease with heart failure: Secondary | ICD-10-CM | POA: Diagnosis present

## 2015-11-17 DIAGNOSIS — Z87891 Personal history of nicotine dependence: Secondary | ICD-10-CM | POA: Diagnosis not present

## 2015-11-17 DIAGNOSIS — Z8673 Personal history of transient ischemic attack (TIA), and cerebral infarction without residual deficits: Secondary | ICD-10-CM

## 2015-11-17 DIAGNOSIS — D72829 Elevated white blood cell count, unspecified: Secondary | ICD-10-CM | POA: Diagnosis present

## 2015-11-17 DIAGNOSIS — J9622 Acute and chronic respiratory failure with hypercapnia: Secondary | ICD-10-CM | POA: Diagnosis present

## 2015-11-17 DIAGNOSIS — Z6837 Body mass index (BMI) 37.0-37.9, adult: Secondary | ICD-10-CM

## 2015-11-17 DIAGNOSIS — Z79899 Other long term (current) drug therapy: Secondary | ICD-10-CM

## 2015-11-17 DIAGNOSIS — J209 Acute bronchitis, unspecified: Secondary | ICD-10-CM | POA: Diagnosis present

## 2015-11-17 DIAGNOSIS — Z9981 Dependence on supplemental oxygen: Secondary | ICD-10-CM | POA: Diagnosis not present

## 2015-11-17 DIAGNOSIS — E7800 Pure hypercholesterolemia, unspecified: Secondary | ICD-10-CM | POA: Diagnosis present

## 2015-11-17 DIAGNOSIS — D869 Sarcoidosis, unspecified: Secondary | ICD-10-CM | POA: Diagnosis present

## 2015-11-17 LAB — GLUCOSE, CAPILLARY
GLUCOSE-CAPILLARY: 145 mg/dL — AB (ref 65–99)
Glucose-Capillary: 274 mg/dL — ABNORMAL HIGH (ref 65–99)
Glucose-Capillary: 191 mg/dL — ABNORMAL HIGH (ref 65–99)

## 2015-11-17 LAB — CBC WITH DIFFERENTIAL/PLATELET
BASOS PCT: 0 %
Basophils Absolute: 0 10*3/uL (ref 0–0.1)
Eosinophils Absolute: 0.2 10*3/uL (ref 0–0.7)
Eosinophils Relative: 2 %
HEMATOCRIT: 42 % (ref 40.0–52.0)
Hemoglobin: 14.4 g/dL (ref 13.0–18.0)
Lymphocytes Relative: 20 %
Lymphs Abs: 1.7 10*3/uL (ref 1.0–3.6)
MCH: 28.3 pg (ref 26.0–34.0)
MCHC: 34.3 g/dL (ref 32.0–36.0)
MCV: 82.7 fL (ref 80.0–100.0)
MONO ABS: 1.3 10*3/uL — AB (ref 0.2–1.0)
MONOS PCT: 15 %
NEUTROS ABS: 5.4 10*3/uL (ref 1.4–6.5)
Neutrophils Relative %: 63 %
Platelets: 181 10*3/uL (ref 150–440)
RBC: 5.08 MIL/uL (ref 4.40–5.90)
RDW: 14.7 % — AB (ref 11.5–14.5)
WBC: 8.7 10*3/uL (ref 3.8–10.6)

## 2015-11-17 LAB — BLOOD GAS, VENOUS
Acid-Base Excess: 7.9 mmol/L — ABNORMAL HIGH (ref 0.0–2.0)
Bicarbonate: 37.1 mmol/L — ABNORMAL HIGH (ref 20.0–28.0)
FIO2: 0.21
O2 Saturation: 65.2 %
PATIENT TEMPERATURE: 37
pCO2, Ven: 72 mmHg (ref 44.0–60.0)
pH, Ven: 7.32 (ref 7.250–7.430)
pO2, Ven: 37 mmHg (ref 32.0–45.0)

## 2015-11-17 LAB — COMPREHENSIVE METABOLIC PANEL
ALK PHOS: 71 U/L (ref 38–126)
ALT: 20 U/L (ref 17–63)
AST: 20 U/L (ref 15–41)
Albumin: 4 g/dL (ref 3.5–5.0)
Anion gap: 6 (ref 5–15)
BUN: 12 mg/dL (ref 6–20)
CALCIUM: 8.2 mg/dL — AB (ref 8.9–10.3)
CO2: 33 mmol/L — ABNORMAL HIGH (ref 22–32)
CREATININE: 1.43 mg/dL — AB (ref 0.61–1.24)
Chloride: 101 mmol/L (ref 101–111)
GFR, EST NON AFRICAN AMERICAN: 58 mL/min — AB (ref >60)
Glucose, Bld: 133 mg/dL — ABNORMAL HIGH (ref 65–99)
Potassium: 3.8 mmol/L (ref 3.5–5.1)
Sodium: 140 mmol/L (ref 135–145)
TOTAL PROTEIN: 7.4 g/dL (ref 6.5–8.1)
Total Bilirubin: 0.5 mg/dL (ref 0.3–1.2)

## 2015-11-17 LAB — BRAIN NATRIURETIC PEPTIDE: B Natriuretic Peptide: 25 pg/mL (ref 0.0–100.0)

## 2015-11-17 LAB — TROPONIN I

## 2015-11-17 LAB — MRSA PCR SCREENING: MRSA BY PCR: POSITIVE — AB

## 2015-11-17 MED ORDER — ATORVASTATIN CALCIUM 20 MG PO TABS
40.0000 mg | ORAL_TABLET | Freq: Every day | ORAL | Status: DC
  Administered 2015-11-17: 21:00:00 40 mg via ORAL
  Filled 2015-11-17: qty 2

## 2015-11-17 MED ORDER — ONDANSETRON HCL 4 MG PO TABS: 4.0000 mg | ORAL_TABLET | Freq: Four times a day (QID) | ORAL | Status: DC | PRN

## 2015-11-17 MED ORDER — ACETAMINOPHEN 650 MG RE SUPP: 650.0000 mg | Freq: Four times a day (QID) | RECTAL | Status: DC | PRN

## 2015-11-17 MED ORDER — LEVOFLOXACIN IN D5W 750 MG/150ML IV SOLN
750.0000 mg | INTRAVENOUS | Status: DC
  Filled 2015-11-17: qty 150

## 2015-11-17 MED ORDER — FUROSEMIDE 40 MG PO TABS
80.0000 mg | ORAL_TABLET | Freq: Two times a day (BID) | ORAL | Status: DC
  Administered 2015-11-17 – 2015-11-18 (×2): 80 mg via ORAL
  Filled 2015-11-17 (×3): qty 2

## 2015-11-17 MED ORDER — ORAL CARE MOUTH RINSE
15.0000 mL | Freq: Two times a day (BID) | OROMUCOSAL | Status: DC
  Administered 2015-11-17 – 2015-11-18 (×2): 15 mL via OROMUCOSAL

## 2015-11-17 MED ORDER — ONDANSETRON HCL 4 MG/2ML IJ SOLN: 4.0000 mg | Freq: Four times a day (QID) | INTRAMUSCULAR | Status: DC | PRN

## 2015-11-17 MED ORDER — ISOSORBIDE DINITRATE 10 MG PO TABS
10.0000 mg | ORAL_TABLET | Freq: Three times a day (TID) | ORAL | Status: DC
  Administered 2015-11-17 – 2015-11-18 (×3): 10 mg via ORAL
  Filled 2015-11-17 (×5): qty 1

## 2015-11-17 MED ORDER — IPRATROPIUM-ALBUTEROL 0.5-2.5 (3) MG/3ML IN SOLN
3.0000 mL | RESPIRATORY_TRACT | Status: DC
Start: 2015-11-17 — End: 2015-11-18
  Administered 2015-11-17 – 2015-11-18 (×6): 3 mL via RESPIRATORY_TRACT
  Filled 2015-11-17 (×6): qty 3

## 2015-11-17 MED ORDER — INSULIN ASPART 100 UNIT/ML ~~LOC~~ SOLN
0.0000 [IU] | Freq: Every day | SUBCUTANEOUS | Status: DC
Start: 2015-11-17 — End: 2015-11-18

## 2015-11-17 MED ORDER — MOMETASONE FURO-FORMOTEROL FUM 200-5 MCG/ACT IN AERO
2.0000 | INHALATION_SPRAY | Freq: Two times a day (BID) | RESPIRATORY_TRACT | Status: DC
  Filled 2015-11-17: qty 8.8

## 2015-11-17 MED ORDER — PREDNISOLONE 5 MG PO TABS: 10.0000 mg | ORAL_TABLET | Freq: Every day | ORAL | Status: DC

## 2015-11-17 MED ORDER — SENNOSIDES-DOCUSATE SODIUM 8.6-50 MG PO TABS: 1.0000 | ORAL_TABLET | Freq: Every evening | ORAL | Status: DC | PRN

## 2015-11-17 MED ORDER — BUDESONIDE 0.5 MG/2ML IN SUSP
0.5000 mg | Freq: Two times a day (BID) | RESPIRATORY_TRACT | Status: DC
  Administered 2015-11-17 – 2015-11-18 (×2): 0.5 mg via RESPIRATORY_TRACT
  Filled 2015-11-17 (×2): qty 2

## 2015-11-17 MED ORDER — AZITHROMYCIN 250 MG PO TABS: 250.0000 mg | ORAL_TABLET | Freq: Every day | ORAL | Status: DC

## 2015-11-17 MED ORDER — TIOTROPIUM BROMIDE MONOHYDRATE 18 MCG IN CAPS
18.0000 ug | ORAL_CAPSULE | Freq: Every day | RESPIRATORY_TRACT | Status: DC
  Filled 2015-11-17: qty 5

## 2015-11-17 MED ORDER — PREDNISOLONE 5 MG PO TABS: 30.0000 mg | ORAL_TABLET | Freq: Every day | ORAL | Status: DC

## 2015-11-17 MED ORDER — INSULIN ASPART 100 UNIT/ML ~~LOC~~ SOLN
0.0000 [IU] | Freq: Three times a day (TID) | SUBCUTANEOUS | Status: DC
  Administered 2015-11-17: 11 [IU] via SUBCUTANEOUS
  Administered 2015-11-17: 3 [IU] via SUBCUTANEOUS
  Administered 2015-11-18 (×2): 4 [IU] via SUBCUTANEOUS
  Filled 2015-11-17: qty 4
  Filled 2015-11-17: qty 11
  Filled 2015-11-17: qty 3
  Filled 2015-11-17: qty 4

## 2015-11-17 MED ORDER — IPRATROPIUM-ALBUTEROL 0.5-2.5 (3) MG/3ML IN SOLN
3.0000 mL | Freq: Once | RESPIRATORY_TRACT | Status: AC
  Administered 2015-11-17: 3 mL via RESPIRATORY_TRACT
  Filled 2015-11-17: qty 3

## 2015-11-17 MED ORDER — PREDNISONE 20 MG PO TABS: 20.0000 mg | ORAL_TABLET | Freq: Every day | ORAL | Status: DC

## 2015-11-17 MED ORDER — IPRATROPIUM-ALBUTEROL 0.5-2.5 (3) MG/3ML IN SOLN
3.0000 mL | Freq: Four times a day (QID) | RESPIRATORY_TRACT | Status: DC
  Filled 2015-11-17: qty 3

## 2015-11-17 MED ORDER — PREDNISONE 10 MG PO TABS: 10.0000 mg | ORAL_TABLET | Freq: Every day | ORAL | Status: DC

## 2015-11-17 MED ORDER — ENOXAPARIN SODIUM 40 MG/0.4ML ~~LOC~~ SOLN
40.0000 mg | SUBCUTANEOUS | Status: DC
  Filled 2015-11-17: qty 0.4

## 2015-11-17 MED ORDER — HYDROCODONE-ACETAMINOPHEN 5-325 MG PO TABS: 1.0000 | ORAL_TABLET | ORAL | Status: DC | PRN

## 2015-11-17 MED ORDER — PREDNISONE 20 MG PO TABS
40.0000 mg | ORAL_TABLET | Freq: Every day | ORAL | Status: DC
  Administered 2015-11-18: 40 mg via ORAL
  Filled 2015-11-17: qty 2

## 2015-11-17 MED ORDER — PREDNISOLONE 5 MG PO TABS: 20.0000 mg | ORAL_TABLET | Freq: Every day | ORAL | Status: DC

## 2015-11-17 MED ORDER — POTASSIUM CHLORIDE CRYS ER 20 MEQ PO TBCR
20.0000 meq | EXTENDED_RELEASE_TABLET | Freq: Two times a day (BID) | ORAL | Status: DC
  Administered 2015-11-17 – 2015-11-18 (×3): 20 meq via ORAL
  Filled 2015-11-17 (×3): qty 1

## 2015-11-17 MED ORDER — CHLORHEXIDINE GLUCONATE CLOTH 2 % EX PADS
6.0000 | MEDICATED_PAD | Freq: Every day | CUTANEOUS | Status: DC
  Administered 2015-11-18: 6 via TOPICAL

## 2015-11-17 MED ORDER — METOPROLOL TARTRATE 50 MG PO TABS
100.0000 mg | ORAL_TABLET | Freq: Two times a day (BID) | ORAL | Status: DC
  Administered 2015-11-17 – 2015-11-18 (×3): 100 mg via ORAL
  Filled 2015-11-17 (×3): qty 2

## 2015-11-17 MED ORDER — CHLORHEXIDINE GLUCONATE 0.12 % MT SOLN
15.0000 mL | Freq: Two times a day (BID) | OROMUCOSAL | Status: DC
  Administered 2015-11-17 – 2015-11-18 (×3): 15 mL via OROMUCOSAL
  Filled 2015-11-17 (×2): qty 15

## 2015-11-17 MED ORDER — LISINOPRIL 10 MG PO TABS
10.0000 mg | ORAL_TABLET | Freq: Every day | ORAL | Status: DC
  Administered 2015-11-17 – 2015-11-18 (×2): 10 mg via ORAL
  Filled 2015-11-17 (×2): qty 1

## 2015-11-17 MED ORDER — SODIUM CHLORIDE 0.9% FLUSH
3.0000 mL | Freq: Two times a day (BID) | INTRAVENOUS | Status: DC
  Administered 2015-11-17 – 2015-11-18 (×3): 3 mL via INTRAVENOUS

## 2015-11-17 MED ORDER — MUPIROCIN 2 % EX OINT
TOPICAL_OINTMENT | Freq: Two times a day (BID) | CUTANEOUS | Status: DC
  Administered 2015-11-17: 1 via NASAL
  Administered 2015-11-17 – 2015-11-18 (×2): via NASAL
  Filled 2015-11-17: qty 22

## 2015-11-17 MED ORDER — PREDNISOLONE 5 MG PO TABS
40.0000 mg | ORAL_TABLET | Freq: Every day | ORAL | Status: DC
Start: 2015-11-18 — End: 2015-11-17

## 2015-11-17 MED ORDER — INSULIN DETEMIR 100 UNIT/ML ~~LOC~~ SOLN
40.0000 [IU] | Freq: Every day | SUBCUTANEOUS | Status: DC
  Administered 2015-11-17: 40 [IU] via SUBCUTANEOUS
  Filled 2015-11-17 (×2): qty 0.4

## 2015-11-17 MED ORDER — DEXTROSE 5 % IV SOLN
500.0000 mg | Freq: Once | INTRAVENOUS | Status: AC
  Administered 2015-11-17: 500 mg via INTRAVENOUS
  Filled 2015-11-17: qty 500

## 2015-11-17 MED ORDER — MUPIROCIN 2 % EX OINT
1.0000 "application " | TOPICAL_OINTMENT | Freq: Two times a day (BID) | CUTANEOUS | Status: DC
  Filled 2015-11-17: qty 22

## 2015-11-17 MED ORDER — ASPIRIN 81 MG PO CHEW
81.0000 mg | CHEWABLE_TABLET | Freq: Every day | ORAL | Status: DC
  Administered 2015-11-17 – 2015-11-18 (×2): 81 mg via ORAL
  Filled 2015-11-17 (×2): qty 1

## 2015-11-17 MED ORDER — METHYLPREDNISOLONE SODIUM SUCC 125 MG IJ SOLR
125.0000 mg | Freq: Once | INTRAMUSCULAR | Status: AC
  Administered 2015-11-17: 125 mg via INTRAVENOUS
  Filled 2015-11-17: qty 2

## 2015-11-17 MED ORDER — METHYLPREDNISOLONE SODIUM SUCC 125 MG IJ SOLR: 60.0000 mg | Freq: Three times a day (TID) | INTRAMUSCULAR | Status: DC

## 2015-11-17 MED ORDER — ACETAMINOPHEN 325 MG PO TABS: 650.0000 mg | ORAL_TABLET | Freq: Four times a day (QID) | ORAL | Status: DC | PRN

## 2015-11-17 MED ORDER — ALBUTEROL SULFATE (2.5 MG/3ML) 0.083% IN NEBU: 2.5000 mg | INHALATION_SOLUTION | Freq: Four times a day (QID) | RESPIRATORY_TRACT | Status: DC | PRN

## 2015-11-17 MED ORDER — IPRATROPIUM-ALBUTEROL 0.5-2.5 (3) MG/3ML IN SOLN
3.0000 mL | RESPIRATORY_TRACT | Status: DC
  Administered 2015-11-17: 3 mL via RESPIRATORY_TRACT

## 2015-11-17 MED ORDER — PREDNISONE 20 MG PO TABS: 30.0000 mg | ORAL_TABLET | Freq: Every day | ORAL | Status: DC

## 2015-11-17 NOTE — H&P (Signed)
[place] [facility] [place] NAME: [name]    MR#:  [id number]  DATE OF BIRTH:  Mar 25, 1970  DATE OF ADMISSION:  11/17/2015  PRIMARY CARE PHYSICIAN: Dr. [name]   REQUESTING/REFERRING PHYSICIAN: Dr. Jimmye Norman  CHIEF COMPLAINT:   Shortness of breath HISTORY OF PRESENT ILLNESS:  [name]  is a 45 y.o. male with a known history of Sarcoidosis on BiPAP at night who presents with above complaint. Patient reports the past 2 days he has had increasing shortness of breath and a nonproductive cough associated with some wheezing. He uses BiPAP every night and is compliant with this. He denies fever or chills. In the emergency room he was placed on BiPAP.  PAST MEDICAL HISTORY:   Past Medical History:  Diagnosis Date  . COPD (chronic obstructive pulmonary disease) ([place])   . Coronary artery disease   . Diabetes mellitus without complication ([place])   . Hypercholesteremia unk  . Hypertension   . Sarcoidosis ([place])   . Sleep apnea    does not use CPAP regularly.  . Stroke [facility])     PAST SURGICAL HISTORY:   Past Surgical History:  Procedure Laterality Date  . APPENDECTOMY      SOCIAL HISTORY:   Social History  Substance Use Topics  . Smoking status: Former Smoker    Packs/day: 0.33    Types: Cigarettes    Quit date: 02/05/2015  . Smokeless tobacco: Never Used  . Alcohol use 0.0 oz/week     Comment: occassional    FAMILY HISTORY:   Family History  Problem Relation Age of Onset  . Hypertension Mother   . Heart disease Mother   . Diabetes Mother   . Cancer Father     DRUG ALLERGIES:   Allergies  Allergen Reactions  . Penicillins Anaphylaxis and Other (See Comments)    Unable to obtain enough information to answer additional questions about this medication.      REVIEW OF SYSTEMS:   Review of Systems  Constitutional: Negative.  Negative for chills, fever and malaise/fatigue.  HENT: Negative.  Negative for ear discharge,  ear pain, hearing loss, nosebleeds and sore throat.   Eyes: Negative.  Negative for blurred vision and pain.  Respiratory: Positive for cough, sputum production, shortness of breath and wheezing. Negative for hemoptysis.   Cardiovascular: Negative.  Negative for chest pain, palpitations and leg swelling.  Gastrointestinal: Negative.  Negative for abdominal pain, blood in stool, diarrhea, nausea and vomiting.  Genitourinary: Negative.  Negative for dysuria.  Musculoskeletal: Negative.  Negative for back pain.  Skin: Negative.   Neurological: Negative for dizziness, tremors, speech change, focal weakness, seizures and headaches.  Endo/Heme/Allergies: Negative.  Does not bruise/bleed easily.  Psychiatric/Behavioral: Negative.  Negative for depression, hallucinations and suicidal ideas.    MEDICATIONS AT HOME:   Prior to Admission medications   Medication Sig Start Date End Date Taking? Authorizing Provider  albuterol (PROVENTIL) (2.5 MG/3ML) 0.083% nebulizer solution Take 2.5 mg by nebulization every 6 (six) hours as needed for wheezing or shortness of breath.   Yes Historical Provider, MD  aspirin 81 MG chewable tablet Chew 81 mg by mouth daily.   Yes Historical Provider, MD  atorvastatin (LIPITOR) 40 MG tablet Take 40 mg by mouth at bedtime.    Yes Historical Provider, MD  Fluticasone-Salmeterol (ADVAIR) 250-50 MCG/DOSE AEPB Inhale 1 puff into the lungs 2 (two) times daily.   Yes Historical Provider, MD  furosemide (LASIX) 80 MG tablet Take 1  tablet (80 mg total) by mouth 2 (two) times daily. 03/15/15  Yes Alisa Graff, FNP  insulin detemir (LEVEMIR) 100 UNIT/ML injection Inject 40 Units into the skin at bedtime.    Yes Historical Provider, MD  insulin lispro (HUMALOG) 100 UNIT/ML injection Inject 8 Units into the skin 3 (three) times daily with meals. Sliding scale as needed   Yes Historical Provider, MD  ipratropium-albuterol (DUONEB) 0.5-2.5 (3) MG/3ML SOLN Take 3 mLs by nebulization 4  (four) times daily. 02/21/15  Yes Theodoro Grist, MD  isosorbide dinitrate (ISORDIL) 10 MG tablet Take 1 tablet (10 mg total) by mouth 3 (three) times daily. 04/01/15  Yes Alisa Graff, FNP  lisinopril (PRINIVIL,ZESTRIL) 10 MG tablet Take 10 mg by mouth daily.   Yes Historical Provider, MD  metFORMIN (GLUCOPHAGE-XR) 500 MG 24 hr tablet Take 500 mg by mouth 2 (two) times daily.    Yes Historical Provider, MD  metoprolol (LOPRESSOR) 100 MG tablet Take 100 mg by mouth 2 (two) times daily.   Yes Historical Provider, MD  potassium chloride SA (K-DUR,KLOR-CON) 20 MEQ tablet Take 1 tablet (20 mEq total) by mouth 2 (two) times daily. 04/01/15  Yes Alisa Graff, FNP  tiotropium (SPIRIVA) 18 MCG inhalation capsule Place 18 mcg into inhaler and inhale daily.   Yes Historical Provider, MD      VITAL SIGNS:  Blood pressure (!) 131/95, pulse 83, temperature 98.5 F (36.9 C), temperature source Oral, resp. rate 16, height 6\' 5"  (1.956 m), weight 129.3 kg (285 lb), SpO2 91 %.  PHYSICAL EXAMINATION:   Physical Exam  Constitutional: He is oriented to person, place, and time and well-developed, well-nourished, and in no distress. No distress.  HENT:  Head: Normocephalic.  Wearing BiPAP  Eyes: No scleral icterus.  Neck: Normal range of motion. Neck supple. No JVD present. No tracheal deviation present.  Cardiovascular: Normal rate, regular rhythm and normal heart sounds.  Exam reveals no gallop and no friction rub.   No murmur heard. Pulmonary/Chest: Effort normal. No respiratory distress. He has wheezes. He has no rales. He exhibits no tenderness.  Abdominal: Soft. Bowel sounds are normal. He exhibits no distension and no mass. There is no tenderness. There is no rebound and no guarding.  Musculoskeletal: Normal range of motion. He exhibits no edema.  Neurological: He is alert and oriented to person, place, and time.  Skin: Skin is warm. No rash noted. No erythema.  Psychiatric: Affect and judgment  normal.      LABORATORY PANEL:   CBC  Recent Labs Lab 11/17/15 0820  WBC 8.7  HGB 14.4  HCT 42.0  PLT 181   ------------------------------------------------------------------------------------------------------------------  Chemistries   Recent Labs Lab 11/17/15 0820  NA 140  K 3.8  CL 101  CO2 33*  GLUCOSE 133*  BUN 12  CREATININE 1.43*  CALCIUM 8.2*  AST 20  ALT 20  ALKPHOS 71  BILITOT 0.5   ------------------------------------------------------------------------------------------------------------------  Cardiac Enzymes  Recent Labs Lab 11/17/15 0820  TROPONINI <0.03   ------------------------------------------------------------------------------------------------------------------  RADIOLOGY:  Dg Chest 2 View  Result Date: 11/17/2015 CLINICAL DATA:  Shortness of breath and dry cough for 2 days. Congestion. EXAM: CHEST  2 VIEW COMPARISON:  Chest radiograph 02/15/2015 FINDINGS: Stable cardiac and mediastinal contours. Redemonstrated bandlike opacities within the right and left lungs. No large area of pulmonary consolidation. No pleural effusion or pneumothorax. Mid thoracic spine degenerative changes. IMPRESSION: Pulmonary scarring without acute superimposed process. Electronically Signed   By: Polly Cobia.D.  On: 11/17/2015 08:04    EKG:     IMPRESSION AND PLAN:   45 year old male with history of bilateral sarcoidosis who presents with COPD exacerbation.  1. Acute hypoxic and hypercapnic respiratory failure due to COPD exacerbation: Continue IV steroids, antibiotics and nebulizer treatments.   2. History of sarcoid: I do not feel this is a flare. Pulmonary consult.  3. Diabetes: Continue Levemir, sliding scale insulin and diabetic diet.  4. Essential hypertension: Continue lisinopril and metoprolol.  5. Hyperlipidemia: Continue atorvastatin.    All the records are reviewed and case discussed with ED provider. Management plans  discussed with the patient and he in agreement  CODE STATUS: full  TOTAL TIME TAKING CARE OF THIS PATIENT: 50 minutes.    Perl Kerney M.D on 11/17/2015 at 12:52 PM  Between 7am to 6pm - Pager - [phone]  After 6pm go to [email or web address] - password [email or web address] [facility]  Office  [phone]  CC: Primary care physician; No primary care provider on file.

## 2015-11-17 NOTE — Consult Note (Signed)
Name: [name] MRN: [id number] DOB: Sep 23, 1970    ADMISSION DATE:  11/17/2015 CONSULTATION DATE:  11/17/2015  REFERRING MD :  Dr. Benjie Karvonen  CHIEF COMPLAINT:  Shortness of breath  BRIEF PATIENT DESCRIPTION: 45 y.o. male admitted to [facility] on 10/12 due to acute on chronic hypercapnic hypoxic respiratory failure secondary to AECOPD  SIGNIFICANT EVENTS  11/17/2015>> Admitted to [facility] ICU PCCM consulted for further management of acute on chronic hypercapnic hypoxic respiratory failure secondary to AECOPD  STUDIES:  None  HISTORY OF PRESENT ILLNESS:  This is a 45 y.o. male with a PMH of Sarcoidosis, Stroke, OSA (Bipap qhs), Hypertension, Hypercholesteremia, Diabetes Mellitus, CAD, COPD, and Chronic home O2 with activity at 1 to 2L.  He presented to [facility] ER on 10/12 with c/o shortness of breath and wheezing onset 10/10.  He states he used his Proair as prescribed, however his symptoms did not improve.  He has smoked 1/2 pack for approximately 30 years, and according to pt he has tapered down his amount of cigarette usage since January 2017 to 1 to 2 cigarettes every 2 days. His outpatient pulmonologist is Dr. [name] his most recent appointment was Jan. 2017 he stated he was in good health at that time. Per pt he has not had problems with his sarcoidosis in several years. He uses his bipap as prescribed qhs at home.  In the ER he was placed on Bipap and admitted to ICU. PCCM consulted on 10/12 for further management of acute on chronic hypoxic hypercapneic respiratory failure secondary AECOPD.  PAST MEDICAL HISTORY :   has a past medical history of COPD (chronic obstructive pulmonary disease) ([place]); Coronary artery disease; Diabetes mellitus without complication ([place]); Hypercholesteremia ([place]); Hypertension; Sarcoidosis ([place]); Sleep apnea; and Stroke ([place]).  has a past surgical history that includes Appendectomy. Prior to Admission medications   Medication Sig Start Date End Date Taking?  Authorizing Provider  albuterol (PROVENTIL) (2.5 MG/3ML) 0.083% nebulizer solution Take 2.5 mg by nebulization every 6 (six) hours as needed for wheezing or shortness of breath.   Yes Historical Provider, MD  aspirin 81 MG chewable tablet Chew 81 mg by mouth daily.   Yes Historical Provider, MD  atorvastatin (LIPITOR) 40 MG tablet Take 40 mg by mouth at bedtime.    Yes Historical Provider, MD  Fluticasone-Salmeterol (ADVAIR) 250-50 MCG/DOSE AEPB Inhale 1 puff into the lungs 2 (two) times daily.   Yes Historical Provider, MD  furosemide (LASIX) 80 MG tablet Take 1 tablet (80 mg total) by mouth 2 (two) times daily. 03/15/15  Yes Alisa Graff, FNP  insulin detemir (LEVEMIR) 100 UNIT/ML injection Inject 40 Units into the skin at bedtime.    Yes Historical Provider, MD  insulin lispro (HUMALOG) 100 UNIT/ML injection Inject 8 Units into the skin 3 (three) times daily with meals. Sliding scale as needed   Yes Historical Provider, MD  ipratropium-albuterol (DUONEB) 0.5-2.5 (3) MG/3ML SOLN Take 3 mLs by nebulization 4 (four) times daily. 02/21/15  Yes Theodoro Grist, MD  isosorbide dinitrate (ISORDIL) 10 MG tablet Take 1 tablet (10 mg total) by mouth 3 (three) times daily. 04/01/15  Yes Alisa Graff, FNP  lisinopril (PRINIVIL,ZESTRIL) 10 MG tablet Take 10 mg by mouth daily.   Yes Historical Provider, MD  metFORMIN (GLUCOPHAGE-XR) 500 MG 24 hr tablet Take 500 mg by mouth 2 (two) times daily.    Yes Historical Provider, MD  metoprolol (LOPRESSOR) 100 MG tablet Take 100 mg by mouth 2 (two) times daily.  Yes Historical Provider, MD  potassium chloride SA (K-DUR,KLOR-CON) 20 MEQ tablet Take 1 tablet (20 mEq total) by mouth 2 (two) times daily. 04/01/15  Yes Alisa Graff, FNP  tiotropium (SPIRIVA) 18 MCG inhalation capsule Place 18 mcg into inhaler and inhale daily.   Yes Historical Provider, MD   Allergies  Allergen Reactions  . Penicillins Anaphylaxis and Other (See Comments)    Unable to obtain enough  information to answer additional questions about this medication.      FAMILY HISTORY:  family history includes Cancer in his father; Diabetes in his mother; Heart disease in his mother; Hypertension in his mother. SOCIAL HISTORY:  reports that he quit smoking about 9 months ago. His smoking use included Cigarettes. He smoked 0.33 packs per day. He has never used smokeless tobacco. He reports that he drinks alcohol. He reports that he uses drugs, including Marijuana.  REVIEW OF SYSTEMS: Positive in Bold  Constitutional: Negative for fever, chills, weight loss, malaise/fatigue and diaphoresis.  HENT: Negative for hearing loss, ear pain, nosebleeds, congestion, sore throat, neck pain, tinnitus and ear discharge.   Eyes: Negative for blurred vision, double vision, photophobia, pain, discharge and redness.  Respiratory: cough, hemoptysis, sputum production, shortness of breath, wheezing and stridor.   Cardiovascular: Negative for chest pain, palpitations, orthopnea, claudication, leg swelling and PND.  Gastrointestinal: Negative for heartburn, nausea, vomiting, abdominal pain, diarrhea, constipation, blood in stool and melena.  Genitourinary: Negative for dysuria, urgency, frequency, hematuria and flank pain.  Musculoskeletal: Negative for myalgias, back pain, joint pain and falls.  Skin: Negative for itching and rash.  Neurological: Negative for dizziness, tingling, tremors, sensory change, speech change, focal weakness, seizures, loss of consciousness, weakness and headaches.  Endo/Heme/Allergies: Negative for environmental allergies and polydipsia. Does not bruise/bleed easily.  SUBJECTIVE:  Pt states he feels better following administration of additional medications in the ER.    VITAL SIGNS: Temp:  [98.5 F (36.9 C)] 98.5 F (36.9 C) (10/12 0720) Pulse Rate:  [73-89] 83 (10/12 1030) Resp:  [11-26] 16 (10/12 0930) BP: (107-136)/(75-95) 131/95 (10/12 1030) SpO2:  [82 %-100 %] 91 %  (10/12 1030) Weight:  [129.3 kg (285 lb)] 129.3 kg (285 lb) (10/12 0720)  PHYSICAL EXAMINATION: General:  Well developed, well nourished, obese AA male  Neuro:  Alert and oriented x4, follows commands, PERRLA HEENT:  Supple, no JVD Cardiovascular:  RRR, S1S2, no rubs, gallops, or murmurs Lungs:  Expiratory wheezing, no accessory muscle use, non labored Abdomen:  +BS x4, obese, soft, non tender, non distended Musculoskeletal:  Normal bulk and tone Skin:  Intact no rashes or lesions   Recent Labs Lab 11/17/15 0820  NA 140  K 3.8  CL 101  CO2 33*  BUN 12  CREATININE 1.43*  GLUCOSE 133*    Recent Labs Lab 11/17/15 0820  HGB 14.4  HCT 42.0  WBC 8.7  PLT 181   Dg Chest 2 View  Result Date: 11/17/2015 CLINICAL DATA:  Shortness of breath and dry cough for 2 days. Congestion. EXAM: CHEST  2 VIEW COMPARISON:  Chest radiograph 02/15/2015 FINDINGS: Stable cardiac and mediastinal contours. Redemonstrated bandlike opacities within the right and left lungs. No large area of pulmonary consolidation. No pleural effusion or pneumothorax. Mid thoracic spine degenerative changes. IMPRESSION: Pulmonary scarring without acute superimposed process. Electronically Signed   By: Lovey Newcomer M.D.   On: 11/17/2015 08:04    ASSESSMENT / PLAN: Acute on chronic hypoxic hypercapnic respiratory failure secondary to AECOPD OSA Hx: Sarcoidosis  -  Maintain O2 sats 88% to 92% -Bipap qhs and prn -Continue bronchodilators -Continue prednisone taper as prescribed  -Continue Azithromycin -prn CXR and ABG  Marda Stalker, [place] Pager [phone] (please enter 7 digits) PCCM Consult Pager [phone] (please enter 7 digits)  STAFF NOTE: I, Dr. Vilinda Boehringer have personally reviewed patient's available data, including medical history, events of note, physical examination and test results as part of my evaluation. I have discussed with resident/NP NP Demaris Callander and  other care providers such as pharmacist, RN and RRT.  In addition,  I personally evaluated patient and elicited key findings of   HPI:  45 yo male with history of sarcoidosis, productive sleep apnea, chronic CO2 retention on nightly BiPAP, coronary disease, diabetes, hypertension, seen in consultation for dyspnea. Patient reports that over the past 2 days he is having increasing shortness of breath with a nonproductive cough and some mild wheezing, it is been getting worse, he noticed that this usually occurs with seasonal changes especially in the spring and fall. He follows with Dr. Vella Kohler for his sarcoidosis and his OSA along with COPD and CO2 retention. He has a history of diastolic heart failure and also follows at the CHF clinic. He presented to the ER with severe dyspnea, was placed on BiPAP and transferred to the ICU, pulmonary was consult for further recommendations. The time of my arrival patient only been on BiPAP for 1.5 hours and is significantly improved, he states that his breathing is much improved, and he only wears BiPAP at night along with 1-2 L of oxygen with exertion during the day. Based on the records it seems that he may have obesity hypoventilation syndrome leading to chronic CO2 retention in conjunction with obstructive sleep apnea and heart failure. He denies any fever chills or leg swelling.  O:  GEN-no acute distress,  HEENT-no lesions, pupils equal and reactive CVS-S1, S2, RRR LUNGS-no respiratory distress, no use of accessory muscles, clear to auscultation bilaterally, no rales, no rhonchi, no wheezing ABD-soft, nontender, nondistended abdominal positive bowel sounds MSK-no peripheral edema, no lesions   Recent Labs CBC Latest Ref Rng & Units 11/17/2015 02/17/2015 02/16/2015  WBC 3.8 - 10.6 K/uL 8.7 - 8.6  Hemoglobin 13.0 - 18.0 g/dL 14.4 - 16.0  Hematocrit 40.0 - 52.0 % 42.0 - 51.9  Platelets 150 - 440 K/uL 181 144(L) 128(L)      Recent Labs BMP Latest Ref  Rng & Units 11/17/2015 03/31/2015 03/15/2015  Glucose 65 - 99 mg/dL 133(H) 187(H) 131(H)  BUN 6 - 20 mg/dL 12 10 21(H)  Creatinine 0.61 - 1.24 mg/dL 1.43(H) 1.31(H) 1.23  Sodium 135 - 145 mmol/L 140 140 143  Potassium 3.5 - 5.1 mmol/L 3.8 3.8 3.3(L)  Chloride 101 - 111 mmol/L 101 101 102  CO2 22 - 32 mmol/L 33(H) 30 32  Calcium 8.9 - 10.3 mg/dL 8.2(L) 8.6(L) 9.0       (The following images and results were reviewed by Dr. Stevenson Clinch on 11/17/2015). Dg Chest 2 View  Result Date: 11/17/2015 CLINICAL DATA:  Shortness of breath and dry cough for 2 days. Congestion. EXAM: CHEST  2 VIEW COMPARISON:  Chest radiograph 02/15/2015 FINDINGS: Stable cardiac and mediastinal contours. Redemonstrated bandlike opacities within the right and left lungs. No large area of pulmonary consolidation. No pleural effusion or pneumothorax. Mid thoracic spine degenerative changes. IMPRESSION: Pulmonary scarring without acute superimposed process. Electronically Signed   By: Lovey Newcomer M.D.   On: 11/17/2015 53:62   A:45 year old  male past medical history of OSA, diabetes, COPD, sarcoidosis, hypertension, seen in consultation for dyspnea secondary to mild COPD exacerbation   COPD exacerbation-mild Chronic dyspnea Diastolic heart failure COPD Obesity Sarcoidosis OSA Chronic CO2 retention History of stroke  P:   - Significant improvement after 1.5 hours of BiPAP today, recommend to continue with BiPAP at night as this is his outpatient regimen. -She is a chronic CO2 retainer, and with a mild COPD exacerbation, bronchodilators along with steroids and antibiotics is paramount for his overall clinical improvement. -Discontinue IV steroids, start by mouth steroids and can wean over 12 days -I doubt that this is any type of CHF exacerbation given his mostly pulmonary symptoms and no generalized or peripheral edema. -Stable to transfer to med-surgical unit, continue outpatient follow-up with Dr. Vella Kohler.  Marland Kitchen  Rest per  NP/medical resident whose note is outlined above and that I agree with  The patient is critically ill with multiple organ systems failure and requires high complexity decision making for assessment and support, frequent evaluation and titration of therapies, application of advanced monitoring technologies and extensive interpretation of multiple databases.   Critical Care Time devoted to patient care services described in this note is  45 Minutes.   This time reflects time of care of this signee Dr Vilinda Boehringer.  This critical care time does not reflect procedure time, or teaching time or supervisory time of PA/NP/Med-student/Med Resident etc but could involve care discussion time.  Vilinda Boehringer, MD [facility] Pager [phone] (please enter 7-digits) On Call Pager [phone] (please enter 7-digits)  Note: This note was prepared with Dragon dictation along with smaller phrase technology. Any transcriptional errors that result from this process are unintentional.

## 2015-11-17 NOTE — ED Provider Notes (Signed)
[facility] Emergency Department Provider Note        Time seen: ----------------------------------------- 7:24 AM on 11/17/2015 -----------------------------------------    I have reviewed the triage vital signs and the nursing notes.   HISTORY  Chief Complaint Shortness of Breath and Cough    HPI [name] is a 45 y.o. male who presents ER for CHF in sarcoidosis and for the past 2 days he has felt short of breath. Patient states he wears BiPAP at night, reports nonproductive cough as well. He denies any fever or chills, states typically by the time he gets to the hospital he has had to be intubated or has gone to the intensive care unit. His last hospital admission was in January of this year. Exertion makes his symptoms worse, states when he is ambulatory he drops his oxygen levels.   Past Medical History:  Diagnosis Date  . COPD (chronic obstructive pulmonary disease) ([place])   . Coronary artery disease   . Diabetes mellitus without complication ([place])   . Hypercholesteremia unk  . Hypertension   . Sarcoidosis ([place])   . Sleep apnea    does not use CPAP regularly.  . Stroke [facility])     Patient Active Problem List   Diagnosis Date Noted  . HTN (hypertension) 06/14/2015  . Dental caries 03/08/2015  . Morbid obesity ([place]) 02/21/2015  . Obstructive sleep apnea 02/21/2015  . Thrombocytopenia ([place]) 02/21/2015  . Hypernatremia 02/21/2015  . Diabetes ([place]) 10/19/2014  . Chronic diastolic heart failure ([place]) 10/18/2014  . Chronic obstructive pulmonary disease ([place]) 10/18/2014  . Sarcoidosis ([place]) 04/22/2013  . Other and unspecified hyperlipidemia 04/22/2013    Past Surgical History:  Procedure Laterality Date  . APPENDECTOMY      Allergies Penicillins  Social History Social History  Substance Use Topics  . Smoking status: Former Smoker    Packs/day: 0.33    Types: Cigarettes    Quit date: 02/05/2015  . Smokeless tobacco: Never  Used  . Alcohol use 0.0 oz/week     Comment: occassional    Review of Systems Constitutional: Negative for fever. Cardiovascular: Negative for chest pain. Respiratory: Positive shortness of breath, cough Gastrointestinal: Negative for abdominal pain, vomiting and diarrhea. Genitourinary: Negative for dysuria. Musculoskeletal: Negative for back pain. Skin: Negative for rash. Neurological: Negative for headaches, focal weakness or numbness.  10-point ROS otherwise negative.  ____________________________________________   PHYSICAL EXAM:  VITAL SIGNS: ED Triage Vitals [11/17/15 0720]  Enc Vitals Group     BP 107/75     Pulse Rate 85     Resp 18     Temp 98.5 F (36.9 C)     Temp Source Oral     SpO2 90 %     Weight 285 lb (129.3 kg)     Height 6\' 5"  (1.956 m)     Head Circumference      Peak Flow      Pain Score      Pain Loc      Pain Edu?      Excl. in [place]?     Constitutional: Alert and oriented. No acute distress   Eyes: Conjunctivae are normal. PERRL. Normal extraocular movements. ENT   Head: Normocephalic and atraumatic.   Nose: No congestion/rhinnorhea.   Mouth/Throat: Mucous membranes are moist.   Neck: No stridor. Cardiovascular: Normal rate, regular rhythm. No murmurs, rubs, or gallops. Respiratory: Diminished breath sounds with wheezing bilaterally. Rales noted in the right base Gastrointestinal: Soft and nontender.  Normal bowel sounds Musculoskeletal: Nontender with normal range of motion in all extremities. No lower extremity tenderness nor edema. Neurologic:  Normal speech and language. No gross focal neurologic deficits are appreciated.  Skin:  Skin is warm, dry and intact. No rash noted. Psychiatric: Mood and affect are normal. Speech and behavior are normal.  ____________________________________________  EKG: Interpreted by me.Sinus rhythm rate 84 bpm, normal PR interval, wide QRS, long QT, right bundle branch block, left anterior  fascicular block  ____________________________________________  ED COURSE:  Pertinent labs & imaging results that were available during my care of the patient were reviewed by me and considered in my medical decision making (see chart for details). Clinical Course  Patient presents to ER with dyspnea, likely multifactorial. We will assess with basic labs and imaging. He will receive DuoNebs and steroids.  Procedures ____________________________________________   LABS (pertinent positives/negatives)  Labs Reviewed  CBC WITH DIFFERENTIAL/PLATELET - Abnormal; Notable for the following:       Result Value   RDW 14.7 (*)    Monocytes Absolute 1.3 (*)    All other components within normal limits  COMPREHENSIVE METABOLIC PANEL - Abnormal; Notable for the following:    CO2 33 (*)    Glucose, Bld 133 (*)    Creatinine, Ser 1.43 (*)    Calcium 8.2 (*)    GFR calc non Af Amer 58 (*)    All other components within normal limits  BLOOD GAS, VENOUS - Abnormal; Notable for the following:    pCO2, Ven 72 (*)    Bicarbonate 37.1 (*)    Acid-Base Excess 7.9 (*)    All other components within normal limits  BRAIN NATRIURETIC PEPTIDE  TROPONIN I  CRITICAL CARE Performed by: Earleen Newport   Total critical care time: 30 minutes  Critical care time was exclusive of separately billable procedures and treating other patients.  Critical care was necessary to treat or prevent imminent or life-threatening deterioration.  Critical care was time spent personally by me on the following activities: development of treatment plan with patient and/or surrogate as well as nursing, discussions with consultants, evaluation of patient's response to treatment, examination of patient, obtaining history from patient or surrogate, ordering and performing treatments and interventions, ordering and review of laboratory studies, ordering and review of radiographic studies, pulse oximetry and re-evaluation of  patient's condition.   RADIOLOGY Images were viewed by me  Chest x-ray IMPRESSION: Pulmonary scarring without acute superimposed process.   ____________________________________________  FINAL ASSESSMENT AND PLAN  Dyspnea, COPD with hypoxia and hypercarbia  Plan: Patient with labs and imaging as dictated above. Patient presents to the ER with acute on chronic COPD exacerbation. His PCO2 was 72 and he would likely benefit from a short course of BiPAP. Oxygen saturations have improved some after receiving duo nebs and steroids. There does not appear to be a component of CHF to this today. I will discuss with the hospitalist for admission.   Earleen Newport, MD   Note: This dictation was prepared with Dragon dictation. Any transcriptional errors that result from this process are unintentional    Earleen Newport, MD 11/17/15 [phone]

## 2015-11-17 NOTE — Evaluation (Addendum)
Physical Therapy Evaluation Patient Details Name: [name] MRN: [id number] DOB: 16-Mar-1970 Today's Date: 11/17/2015   History of Present Illness  [name]  is a 45 y.o. male with a known history of Sarcoidosis on BiPAP at night who presents with above complaint. Patient reports the past 2 days he has had increasing shortness of breath and a nonproductive cough associated with some wheezing. He uses BiPAP every night and is compliant with this. He denies fever or chills. In the emergency room he was placed on BiPAP. No reported falls in the last 12 months  Clinical Impression  Pt admitted with above diagnosis. Pt demonstrates independence with all mobility at this time. He requires 2 L/min supplemental O2 to maintain SaO2>90%. Pt reports he uses 2 L/min O2 intermittently at home due to frequent desaturation. Strength is 5/5 throughout bilateral UE/LE. Single leg stance is >10 seconds on each LE. No PT needs identified at this time. Will complete order. Please enter new order if status or needs arise during admission.     Follow Up Recommendations No PT follow up    Equipment Recommendations  None recommended by PT    Recommendations for Other Services       Precautions / Restrictions Precautions Precautions: None Restrictions Weight Bearing Restrictions: No      Mobility  Bed Mobility Overal bed mobility: Independent             General bed mobility comments: Good speed/sequencing  Transfers Overall transfer level: Independent Equipment used: None             General transfer comment: Good speed, sequencing, and stability without UE support. Able to perform sit to stand without UE assist  Ambulation/Gait Ambulation/Gait assistance: Independent Ambulation Distance (Feet): 50 Feet Assistive device: None Gait Pattern/deviations: WFL(Within Functional Limits) Gait velocity: WFL for household mobility   General Gait Details: Mild decrease in gait speed  but difficult due to leads. Pt able to ambulate safely around room without UE support and good stability. Pt removed from supplemental O2 sitting at EOB and SaO2 drops to 88%. Supplemental O2 maintained at 2L/min for the rest of session and SaO2 remains >90%. Pt denies DOE or fatigue with ambulation  Stairs            Wheelchair Mobility    Modified Rankin (Stroke Patients Only)       Balance Overall balance assessment: Independent (Single leg balance >10 seconds)                                           Pertinent Vitals/Pain Pain Assessment: No/denies pain    Home Living Family/patient expects to be discharged to:: Private residence Living Arrangements: Spouse/significant other Available Help at Discharge: Family Type of Home: House Home Access: Stairs to enter Entrance Stairs-Rails: Can reach both Entrance Stairs-Number of Steps: 2 Home Layout: One level Home Equipment: Cane - quad;Cane - single point      Prior Function Level of Independence: Independent         Comments: Indep with household/community mobility; denies fall history.  Recent use of home O2 at 2L with exertion. BiPap at night     Hand Dominance   Dominant Hand: Right    Extremity/Trunk Assessment   Upper Extremity Assessment: Overall WFL for tasks assessed           Lower Extremity Assessment: Overall  WFL for tasks assessed         Communication   Communication: No difficulties  Cognition Arousal/Alertness: Lethargic Behavior During Therapy: WFL for tasks assessed/performed Overall Cognitive Status: Within Functional Limits for tasks assessed                      General Comments      Exercises     Assessment/Plan    PT Assessment Patent does not need any further PT services  PT Problem List            PT Treatment Interventions      PT Goals (Current goals can be found in the Care Plan section)  Acute Rehab PT Goals PT Goal  Formulation: All assessment and education complete, DC therapy    Frequency     Barriers to discharge        Co-evaluation               End of Session Equipment Utilized During Treatment: Gait belt;Oxygen Activity Tolerance: Patient tolerated treatment well Patient left: in chair;with call bell/phone within reach;with nursing/sitter in room Nurse Communication: Mobility status         Time: [id number] PT Time Calculation (min) (ACUTE ONLY): 15 min   Charges:   PT Evaluation $PT Eval Low Complexity: 1 Procedure     PT G Codes:       Pt will benefit from skilled PT services to address deficits in strength, balance, and mobility in order to return to full function at home.  Katya Rolston 11/17/2015, 2:18 PM

## 2015-11-17 NOTE — ED Triage Notes (Signed)
Pt denies fevers and chills.

## 2015-11-17 NOTE — ED Triage Notes (Signed)
Pt reports history of CHF and sarcoidosis and for the past 2 days has felt SOB. Pt reports non productive cough as well.

## 2015-11-17 NOTE — H&P (Signed)
Pt being transferred to room 114. Report called to [place], Therapist, sports. Pt and belongings moved to room 114 without incident.

## 2015-11-18 DIAGNOSIS — G4733 Obstructive sleep apnea (adult) (pediatric): Secondary | ICD-10-CM

## 2015-11-18 DIAGNOSIS — J441 Chronic obstructive pulmonary disease with (acute) exacerbation: Secondary | ICD-10-CM

## 2015-11-18 DIAGNOSIS — I5042 Chronic combined systolic (congestive) and diastolic (congestive) heart failure: Secondary | ICD-10-CM

## 2015-11-18 DIAGNOSIS — R0689 Other abnormalities of breathing: Secondary | ICD-10-CM

## 2015-11-18 DIAGNOSIS — J449 Chronic obstructive pulmonary disease, unspecified: Secondary | ICD-10-CM

## 2015-11-18 LAB — CBC
HEMATOCRIT: 43.5 % (ref 40.0–52.0)
Hemoglobin: 14.4 g/dL (ref 13.0–18.0)
MCH: 28 pg (ref 26.0–34.0)
MCHC: 33.1 g/dL (ref 32.0–36.0)
MCV: 84.5 fL (ref 80.0–100.0)
Platelets: 212 10*3/uL (ref 150–440)
RBC: 5.15 MIL/uL (ref 4.40–5.90)
RDW: 14.2 % (ref 11.5–14.5)
WBC: 14.8 10*3/uL — AB (ref 3.8–10.6)

## 2015-11-18 LAB — BASIC METABOLIC PANEL
Anion gap: 4 — ABNORMAL LOW (ref 5–15)
BUN: 19 mg/dL (ref 6–20)
CHLORIDE: 101 mmol/L (ref 101–111)
CO2: 35 mmol/L — AB (ref 22–32)
CREATININE: 1.54 mg/dL — AB (ref 0.61–1.24)
Calcium: 8.1 mg/dL — ABNORMAL LOW (ref 8.9–10.3)
GFR calc Af Amer: >60 mL/min (ref >60)
GFR calc non Af Amer: 53 mL/min — ABNORMAL LOW (ref >60)
GLUCOSE: 203 mg/dL — AB (ref 65–99)
POTASSIUM: 4.5 mmol/L (ref 3.5–5.1)
Sodium: 140 mmol/L (ref 135–145)

## 2015-11-18 LAB — GLUCOSE, CAPILLARY
GLUCOSE-CAPILLARY: 200 mg/dL — AB (ref 65–99)
Glucose-Capillary: 185 mg/dL — ABNORMAL HIGH (ref 65–99)

## 2015-11-18 MED ORDER — PREDNISONE 10 MG PO TABS: 10.0000 mg | ORAL_TABLET | Freq: Every day | ORAL | 0 refills | Status: DC

## 2015-11-18 MED ORDER — AZITHROMYCIN 250 MG PO TABS: ORAL_TABLET | ORAL | 0 refills | Status: DC

## 2015-11-18 MED ORDER — BUDESONIDE 0.5 MG/2ML IN SUSP
0.5000 mg | Freq: Two times a day (BID) | RESPIRATORY_TRACT | Status: DC
Start: 2015-11-18 — End: 2015-11-18
  Administered 2015-11-18: 0.5 mg via RESPIRATORY_TRACT
  Filled 2015-11-18: qty 2

## 2015-11-18 NOTE — Discharge Summary (Signed)
[facility] [place] NAME: Kizer Gosha    MR#:  [id number]  DATE OF BIRTH:  08/10/1970  DATE OF ADMISSION:  11/17/2015 ADMITTING PHYSICIAN: Bettey Costa, MD  DATE OF DISCHARGE: 11/18/2015  PRIMARY CARE PHYSICIAN: No primary care provider on file.    ADMISSION DIAGNOSIS:  COPD exacerbation ([place]) [J44.1]  DISCHARGE DIAGNOSIS:  Active Problems:   COPD exacerbation ([place])   Hypercarbia   SECONDARY DIAGNOSIS:   Past Medical History:  Diagnosis Date  . COPD (chronic obstructive pulmonary disease) ([place])   . Coronary artery disease   . Diabetes mellitus without complication ([place])   . Hypercholesteremia unk  . Hypertension   . Sarcoidosis ([place])   . Sleep apnea    does not use CPAP regularly.  . Stroke [facility])     HOSPITAL COURSE:   45 year old male with a history of COPD and sarcoidosis who presents with shortness of breath but have acute hypoxic/hypercarbic respiratory failure. For further details please refer to H&P.  1. Acute on chronic hypoxic/hypercapnic respiratory failure secondary to COPD exacerbation: Patient was initially placed on BiPAP machine due to(elevated CO2 and increased work of breathing. Patient responded well to BiPAP. His symptoms have much improved. He was initially started on IV steroids and transitioned to oral steroids. He also was started on antibiotics due to acute bronchitis related to COPD exacerbation. Elevated white blood cell count is from steroids. Chest x-ray showed no pneumonia. 2. Sarcoidosis without flare: Patient will follow-up with Dr. [name], his pulmonologist in 2 weeks.  3. OSA: Patient is on BiPAP at night.  4.Diabetes: Continue Levemir and diabetic diet.  5.. Essential hypertension: Continue lisinopril and metoprolol.  6. Hyperlipidemia: Continue atorvastatin   DISCHARGE CONDITIONS AND DIET:   Stable  Diabetic diet  CONSULTS OBTAINED:    DRUG ALLERGIES:   Allergies  Allergen  Reactions  . Penicillins Anaphylaxis and Other (See Comments)    Unable to obtain enough information to answer additional questions about this medication.      DISCHARGE MEDICATIONS:   Current Discharge Medication List    START taking these medications   Details  azithromycin (ZITHROMAX) 250 MG tablet Take 1 table daily for 4 days Qty: 4 each, Refills: 0    predniSONE (DELTASONE) 10 MG tablet Take 1 tablet (10 mg total) by mouth daily with breakfast. 50 mg PO (ORAL)  x 2 days 40 mg PO (ORAL)  x 2 days 30 mg PO  (ORAL)  x 2 days 20 mg PO  (ORAL) x 2 days 10 mg PO  (ORAL) x 2 days then stop Qty: 30 tablet, Refills: 0      CONTINUE these medications which have NOT CHANGED   Details  albuterol (PROVENTIL) (2.5 MG/3ML) 0.083% nebulizer solution Take 2.5 mg by nebulization every 6 (six) hours as needed for wheezing or shortness of breath.    aspirin 81 MG chewable tablet Chew 81 mg by mouth daily.    atorvastatin (LIPITOR) 40 MG tablet Take 40 mg by mouth at bedtime.     Fluticasone-Salmeterol (ADVAIR) 250-50 MCG/DOSE AEPB Inhale 1 puff into the lungs 2 (two) times daily.    furosemide (LASIX) 80 MG tablet Take 1 tablet (80 mg total) by mouth 2 (two) times daily. Qty: 60 tablet, Refills: 5    insulin detemir (LEVEMIR) 100 UNIT/ML injection Inject 40 Units into the skin at bedtime.     insulin lispro (HUMALOG) 100 UNIT/ML injection Inject 8 Units into the skin  3 (three) times daily with meals. Sliding scale as needed    ipratropium-albuterol (DUONEB) 0.5-2.5 (3) MG/3ML SOLN Take 3 mLs by nebulization 4 (four) times daily. Qty: 360 mL, Refills: 6    isosorbide dinitrate (ISORDIL) 10 MG tablet Take 1 tablet (10 mg total) by mouth 3 (three) times daily. Qty: 90 tablet, Refills: 5    lisinopril (PRINIVIL,ZESTRIL) 10 MG tablet Take 10 mg by mouth daily.    metFORMIN (GLUCOPHAGE-XR) 500 MG 24 hr tablet Take 500 mg by mouth 2 (two) times daily.     metoprolol (LOPRESSOR) 100 MG  tablet Take 100 mg by mouth 2 (two) times daily.    potassium chloride SA (K-DUR,KLOR-CON) 20 MEQ tablet Take 1 tablet (20 mEq total) by mouth 2 (two) times daily. Qty: 60 tablet, Refills: 5    tiotropium (SPIRIVA) 18 MCG inhalation capsule Place 18 mcg into inhaler and inhale daily.              Today   CHIEF COMPLAINT:  Patient is doing much better this morning. Reports he can breathe better than he did on admission.   VITAL SIGNS:  Blood pressure 110/64, pulse 75, temperature 97.8 F (36.6 C), temperature source Oral, resp. rate 19, height 6' (1.829 m), weight 125.8 kg (277 lb 4.8 oz), SpO2 97 %.   REVIEW OF SYSTEMS:  Review of Systems  Constitutional: Negative.  Negative for chills, fever and malaise/fatigue.  HENT: Negative.  Negative for ear discharge, ear pain, hearing loss, nosebleeds and sore throat.   Eyes: Negative.  Negative for blurred vision and pain.  Respiratory: Positive for cough. Negative for hemoptysis, shortness of breath and wheezing.   Cardiovascular: Negative.  Negative for chest pain, palpitations and leg swelling.  Gastrointestinal: Negative.  Negative for abdominal pain, blood in stool, diarrhea, nausea and vomiting.  Genitourinary: Negative.  Negative for dysuria.  Musculoskeletal: Negative.  Negative for back pain.  Skin: Negative.   Neurological: Negative for dizziness, tremors, speech change, focal weakness, seizures and headaches.  Endo/Heme/Allergies: Negative.  Does not bruise/bleed easily.  Psychiatric/Behavioral: Negative.  Negative for depression, hallucinations and suicidal ideas.     PHYSICAL EXAMINATION:  GENERAL:  45 y.o.-year-old patient lying in the bed with no acute distress.  NECK:  Supple, no jugular venous distention. No thyroid enlargement, no tenderness.  LUNGS: Faint bilateral wheezing, no rales,rhonchi  No use of accessory muscles of respiration.  CARDIOVASCULAR: S1, S2 normal. No murmurs, rubs, or gallops.  3 ABDOMEN: Soft, non-tender, non-distended. Bowel sounds present. No organomegaly or mass.  EXTREMITIES: No pedal edema, cyanosis, or clubbing.  PSYCHIATRIC: The patient is alert and oriented x 3.  SKIN: No obvious rash, lesion, or ulcer.   DATA REVIEW:   CBC  Recent Labs Lab 11/18/15 0332  WBC 14.8*  HGB 14.4  HCT 43.5  PLT 212    Chemistries   Recent Labs Lab 11/17/15 0820 11/18/15 0332  NA 140 140  K 3.8 4.5  CL 101 101  CO2 33* 35*  GLUCOSE 133* 203*  BUN 12 19  CREATININE 1.43* 1.54*  CALCIUM 8.2* 8.1*  AST 20  --   ALT 20  --   ALKPHOS 71  --   BILITOT 0.5  --     Cardiac Enzymes  Recent Labs Lab 11/17/15 0820  TROPONINI <0.03    Microbiology Results  @[email or web address] @  RADIOLOGY:  Dg Chest 2 View  Result Date: 11/17/2015 CLINICAL DATA:  Shortness of breath and dry cough for 2 days. Congestion.  EXAM: CHEST  2 VIEW COMPARISON:  Chest radiograph 02/15/2015 FINDINGS: Stable cardiac and mediastinal contours. Redemonstrated bandlike opacities within the right and left lungs. No large area of pulmonary consolidation. No pleural effusion or pneumothorax. Mid thoracic spine degenerative changes. IMPRESSION: Pulmonary scarring without acute superimposed process. Electronically Signed   By: Lovey Newcomer M.D.   On: 11/17/2015 08:04      Management plans discussed with the patient and he is in agreement. Stable for discharge home  Rounded with nursing staffPatient should follow up with pcp  CODE STATUS:     Code Status Orders        Start     Ordered   11/17/15 1131  Full code  Continuous     11/17/15 1131    Code Status History    Date Active Date Inactive Code Status Order ID Comments User Context   02/11/2015  2:17 PM 02/21/2015  6:41 PM Full Code [id number]  Henreitta Leber, MD Inpatient   12/28/2014  9:34 PM 12/30/2014  4:02 PM Full Code [id number]  Fritzi Mandes, MD Inpatient   08/24/2014  2:45 AM 08/26/2014  6:19 PM Full Code [id number]  Lytle Butte, MD  ED   08/14/2014 10:31 AM 08/18/2014  6:30 PM Full Code [id number]  Vaughan Basta, MD Inpatient      TOTAL TIME TAKING CARE OF THIS PATIENT: 38 minutes.    Note: This dictation was prepared with Dragon dictation along with smaller phrase technology. Any transcriptional errors that result from this process are unintentional.  Yared Barefoot M.D on 11/18/2015 at 10:27 AM  Between 7am to 6pm - Pager - [phone] After 6pm go to [email or web address] - password [email or web address] [facility]  Office  [phone]  CC: Primary care physician; No primary care provider on file.

## 2015-11-18 NOTE — Care Management (Signed)
Admitted to [facility] with the diagnosis of COPD. Lives with wife, [name] x 15 years. Last seen Dr. Gwynneth Aliment 3 months ago. Sees Dr. [name] as pulmonary physician. Bi-Pap at night x 8 months. [facility] provided BIPAP and home oxygen. Home oxgen prn x 2 years 2 liters per nasal cannula., [facility]. Skilled Nursing through facility in [place] 3 years ago. No falls. Fine appetite. Prescriptions are filled through [facility] in [place] and [facility]. Discharge to home today per Dr. Benjie Karvonen. Wife will transport. Shelbie Ammons RN MSN CCM Care Management [phone]

## 2015-11-18 NOTE — Progress Notes (Signed)
Discussed discharge instructions and medications with pt. IV removed. All questions addressed. Pt awaiting wife for transport.

## 2015-11-18 NOTE — Care Management Important Message (Signed)
Important Message  Patient Details  Name: JERETH BARNISH MRN: [id number] Date of Birth: 1970/06/13   Medicare Important Message Given:  Yes    Shelbie Ammons, RN 11/18/2015, 9:10 AM

## 2015-12-20 ENCOUNTER — Ambulatory Visit: Payer: Medicare Other | Attending: Family | Admitting: Family

## 2015-12-20 ENCOUNTER — Encounter: Payer: Self-pay | Admitting: Family

## 2015-12-20 VITALS — BP 135/83 | HR 85 | Resp 18 | Ht 73.0 in | Wt 296.0 lb

## 2015-12-20 DIAGNOSIS — J410 Simple chronic bronchitis: Secondary | ICD-10-CM

## 2015-12-20 DIAGNOSIS — I10 Essential (primary) hypertension: Secondary | ICD-10-CM

## 2015-12-20 DIAGNOSIS — E119 Type 2 diabetes mellitus without complications: Secondary | ICD-10-CM | POA: Insufficient documentation

## 2015-12-20 DIAGNOSIS — F17210 Nicotine dependence, cigarettes, uncomplicated: Secondary | ICD-10-CM | POA: Diagnosis not present

## 2015-12-20 DIAGNOSIS — J449 Chronic obstructive pulmonary disease, unspecified: Secondary | ICD-10-CM | POA: Diagnosis not present

## 2015-12-20 DIAGNOSIS — E785 Hyperlipidemia, unspecified: Secondary | ICD-10-CM | POA: Diagnosis not present

## 2015-12-20 DIAGNOSIS — I5032 Chronic diastolic (congestive) heart failure: Secondary | ICD-10-CM | POA: Diagnosis present

## 2015-12-20 DIAGNOSIS — Z79899 Other long term (current) drug therapy: Secondary | ICD-10-CM | POA: Insufficient documentation

## 2015-12-20 DIAGNOSIS — Z7982 Long term (current) use of aspirin: Secondary | ICD-10-CM | POA: Diagnosis not present

## 2015-12-20 DIAGNOSIS — I110 Hypertensive heart disease with heart failure: Secondary | ICD-10-CM | POA: Diagnosis not present

## 2015-12-20 DIAGNOSIS — I2510 Atherosclerotic heart disease of native coronary artery without angina pectoris: Secondary | ICD-10-CM | POA: Diagnosis not present

## 2015-12-20 DIAGNOSIS — Z880 Allergy status to penicillin: Secondary | ICD-10-CM | POA: Insufficient documentation

## 2015-12-20 DIAGNOSIS — Z8673 Personal history of transient ischemic attack (TIA), and cerebral infarction without residual deficits: Secondary | ICD-10-CM | POA: Diagnosis not present

## 2015-12-20 DIAGNOSIS — Z794 Long term (current) use of insulin: Secondary | ICD-10-CM | POA: Insufficient documentation

## 2015-12-20 DIAGNOSIS — G4733 Obstructive sleep apnea (adult) (pediatric): Secondary | ICD-10-CM | POA: Insufficient documentation

## 2015-12-20 DIAGNOSIS — I5042 Chronic combined systolic (congestive) and diastolic (congestive) heart failure: Secondary | ICD-10-CM

## 2015-12-20 DIAGNOSIS — D869 Sarcoidosis, unspecified: Secondary | ICD-10-CM | POA: Diagnosis not present

## 2015-12-20 NOTE — Patient Instructions (Signed)
Continue weighing daily and call for an overnight weight gain of > 2 pounds or a weekly weight gain of >5 pounds. 

## 2015-12-20 NOTE — Progress Notes (Signed)
Patient ID: [name], male    DOB: April 05, 1970, 45 y.o.   MRN: [id number]  HPI  Mr Firpo is a 45 y/o male with a history of CVA, obstructive sleep apnea, HTN, sarcoidosis, hyperlipidemia, DM, CAD, COPD, remote tobacco use and chronic heart failure.  Last echo was done 02/15/15 and showed an EF of 60% with increased wall thickness. Mild MR along with mild TR. This is stable from a previous echo done on 08/15/14.  Was last admitted on 11/17/15 with COPD exacerbation. Was placed on bipap due to elevated CO2 levels with good response. Was given IV steroids and discharged with PO steroids and antibiotics. Admission prior to this was on 02/11/15 with a COPD exacerbation.   He presents today for a follow-up visit with fatigue and shortness of breath with moderate exertion. Denies any swelling in his legs. Admits to not getting any exercise and says that his home weight has gradually increased. Continues to smoke but on an infrequent basis.  Past Medical History:  Diagnosis Date  . COPD (chronic obstructive pulmonary disease) ([place])   . Coronary artery disease   . Diabetes mellitus without complication ([place])   . Hypercholesteremia unk  . Hypertension   . Sarcoidosis ([place])   . Sleep apnea    does not use CPAP regularly.  . Stroke [facility])     Past Surgical History:  Procedure Laterality Date  . APPENDECTOMY      Family History  Problem Relation Age of Onset  . Hypertension Mother   . Heart disease Mother   . Diabetes Mother   . Cancer Father     Social History  Substance Use Topics  . Smoking status: Former Smoker    Packs/day: 0.33    Types: Cigarettes    Quit date: 02/05/2015  . Smokeless tobacco: Never Used  . Alcohol use 0.0 oz/week     Comment: occassional    Allergies  Allergen Reactions  . Penicillins Anaphylaxis and Other (See Comments)    Unable to obtain enough information to answer additional questions about this medication.      Prior to Admission medications    Medication Sig Start Date End Date Taking? Authorizing Provider  albuterol (PROVENTIL) (2.5 MG/3ML) 0.083% nebulizer solution Take 2.5 mg by nebulization every 6 (six) hours as needed for wheezing or shortness of breath.   Yes Historical Provider, MD  aspirin 81 MG chewable tablet Chew 81 mg by mouth daily.   Yes Historical Provider, MD  atorvastatin (LIPITOR) 40 MG tablet Take 40 mg by mouth at bedtime.    Yes Historical Provider, MD  Fluticasone-Salmeterol (ADVAIR) 250-50 MCG/DOSE AEPB Inhale 1 puff into the lungs 2 (two) times daily.   Yes Historical Provider, MD  furosemide (LASIX) 80 MG tablet Take 1 tablet (80 mg total) by mouth 2 (two) times daily. 03/15/15  Yes Alisa Graff, FNP  insulin detemir (LEVEMIR) 100 UNIT/ML injection Inject 40 Units into the skin at bedtime.    Yes Historical Provider, MD  insulin lispro (HUMALOG) 100 UNIT/ML injection Inject 8 Units into the skin 3 (three) times daily with meals. Sliding scale as needed   Yes Historical Provider, MD  ipratropium-albuterol (DUONEB) 0.5-2.5 (3) MG/3ML SOLN Take 3 mLs by nebulization 4 (four) times daily. 02/21/15  Yes Theodoro Grist, MD  isosorbide dinitrate (ISORDIL) 10 MG tablet Take 1 tablet (10 mg total) by mouth 3 (three) times daily. 04/01/15  Yes Alisa Graff, FNP  lisinopril (PRINIVIL,ZESTRIL) 10 MG tablet Take  10 mg by mouth daily.   Yes Historical Provider, MD  metFORMIN (GLUCOPHAGE-XR) 500 MG 24 hr tablet Take 500 mg by mouth 2 (two) times daily.    Yes Historical Provider, MD  metoprolol (LOPRESSOR) 100 MG tablet Take 100 mg by mouth 2 (two) times daily.   Yes Historical Provider, MD  potassium chloride SA (K-DUR,KLOR-CON) 20 MEQ tablet Take 1 tablet (20 mEq total) by mouth 2 (two) times daily. 04/01/15  Yes Alisa Graff, FNP  tiotropium (SPIRIVA) 18 MCG inhalation capsule Place 18 mcg into inhaler and inhale daily.   Yes Historical Provider, MD     Review of Systems  Constitutional: Positive for fatigue. Negative  for appetite change.  HENT: Negative for congestion, postnasal drip and sore throat.   Eyes: Negative.   Respiratory: Positive for shortness of breath. Negative for chest tightness.   Cardiovascular: Negative for chest pain, palpitations and leg swelling.  Gastrointestinal: Negative for abdominal distention and abdominal pain.  Endocrine: Negative.   Genitourinary: Negative.   Musculoskeletal: Negative for back pain and neck pain.  Skin: Negative.   Allergic/Immunologic: Negative.   Neurological: Negative for dizziness and light-headedness.  Hematological: Negative for adenopathy. Does not bruise/bleed easily.  Psychiatric/Behavioral: Positive for sleep disturbance (wearing biPAP nightly). Negative for dysphoric mood. The patient is not nervous/anxious.    Vitals:   12/20/15 1245  BP: 135/83  Pulse: 85  Resp: 18  SpO2: 95%  Weight: 296 lb (134.3 kg)  Height: 6\' 1"  (1.854 m)    Wt Readings from Last 3 Encounters:  12/20/15 296 lb (134.3 kg)  11/18/15 277 lb 4.8 oz (125.8 kg)  09/15/15 299 lb (135.6 kg)    Lab Results  Component Value Date   CREATININE 1.54 (H) 11/18/2015   CREATININE 1.43 (H) 11/17/2015   CREATININE 1.31 (H) 03/31/2015    Physical Exam  Constitutional: He is oriented to person, place, and time. He appears well-developed and well-nourished.  HENT:  Head: Normocephalic and atraumatic.  Eyes: Conjunctivae are normal. Pupils are equal, round, and reactive to light.  Neck: Normal range of motion. Neck supple. No JVD present.  Cardiovascular: Normal rate and regular rhythm.   Pulmonary/Chest: Effort normal. He has no wheezes. He has no rales.  Abdominal: Soft. He exhibits no distension. There is no tenderness.  Musculoskeletal: He exhibits no edema or tenderness.  Neurological: He is alert and oriented to person, place, and time.  Skin: Skin is warm and dry.  Psychiatric: He has a normal mood and affect. His behavior is normal. Thought content normal.   Nursing note and vitals reviewed.    Assessment & Plan:  1: Chronic heart failure with preserved ejection fraction- - NYHA class II - euvolemic - weight down 3 pounds from last visit. Reminded to call for an overnight weight gain of >2 pounds or a weekly weight gain of >5 pounds - get back to exercise - does not take the flu vaccine - sees cardiologist ([place]) on 03/01/16  2: HTN- - BP looks good today  3: COPD- - using bipap at night - saw pulmonologist [name]) on 12/02/15 and returns on 03/22/16 - last PFT's done 09/20/15 - continues to smoke but says that it's about 5 cigarettes weekly. Complete cessation encouraged  4: Diabetes- - reported glucose last night of 101 - continues on levemir, humalog and metformin  Return here in 3 months or sooner for any questions/problems before then

## 2016-03-22 ENCOUNTER — Ambulatory Visit: Payer: Medicare Other | Admitting: Family

## 2016-03-22 ENCOUNTER — Encounter: Payer: Self-pay | Admitting: Family

## 2016-03-22 VITALS — BP 130/88 | HR 94 | Resp 18 | Ht 72.0 in | Wt 290.1 lb

## 2016-03-22 DIAGNOSIS — Z7982 Long term (current) use of aspirin: Secondary | ICD-10-CM | POA: Insufficient documentation

## 2016-03-22 DIAGNOSIS — J441 Chronic obstructive pulmonary disease with (acute) exacerbation: Secondary | ICD-10-CM | POA: Diagnosis not present

## 2016-03-22 DIAGNOSIS — I10 Essential (primary) hypertension: Secondary | ICD-10-CM

## 2016-03-22 DIAGNOSIS — Z880 Allergy status to penicillin: Secondary | ICD-10-CM

## 2016-03-22 DIAGNOSIS — F17210 Nicotine dependence, cigarettes, uncomplicated: Secondary | ICD-10-CM | POA: Insufficient documentation

## 2016-03-22 DIAGNOSIS — D869 Sarcoidosis, unspecified: Secondary | ICD-10-CM | POA: Insufficient documentation

## 2016-03-22 DIAGNOSIS — J449 Chronic obstructive pulmonary disease, unspecified: Secondary | ICD-10-CM | POA: Insufficient documentation

## 2016-03-22 DIAGNOSIS — E119 Type 2 diabetes mellitus without complications: Secondary | ICD-10-CM

## 2016-03-22 DIAGNOSIS — I2510 Atherosclerotic heart disease of native coronary artery without angina pectoris: Secondary | ICD-10-CM | POA: Insufficient documentation

## 2016-03-22 DIAGNOSIS — J410 Simple chronic bronchitis: Secondary | ICD-10-CM

## 2016-03-22 DIAGNOSIS — G4733 Obstructive sleep apnea (adult) (pediatric): Secondary | ICD-10-CM | POA: Insufficient documentation

## 2016-03-22 DIAGNOSIS — Z8673 Personal history of transient ischemic attack (TIA), and cerebral infarction without residual deficits: Secondary | ICD-10-CM | POA: Insufficient documentation

## 2016-03-22 DIAGNOSIS — I5032 Chronic diastolic (congestive) heart failure: Secondary | ICD-10-CM | POA: Insufficient documentation

## 2016-03-22 DIAGNOSIS — I110 Hypertensive heart disease with heart failure: Secondary | ICD-10-CM | POA: Insufficient documentation

## 2016-03-22 DIAGNOSIS — Z79899 Other long term (current) drug therapy: Secondary | ICD-10-CM

## 2016-03-22 DIAGNOSIS — Z794 Long term (current) use of insulin: Secondary | ICD-10-CM

## 2016-03-22 DIAGNOSIS — E785 Hyperlipidemia, unspecified: Secondary | ICD-10-CM

## 2016-03-22 NOTE — Patient Instructions (Signed)
Continue weighing daily and call for an overnight weight gain of > 2 pounds or a weekly weight gain of >5 pounds. 

## 2016-03-22 NOTE — Progress Notes (Signed)
Patient ID: [name], male    DOB: Jun 11, 1970, 46 y.o.   MRN: [id number]  HPI  Mr Esquibel is a 46 y/o male with a history of CVA, obstructive sleep apnea, HTN, sarcoidosis, hyperlipidemia, DM, CAD, COPD, remote tobacco use and chronic heart failure.  Last echo was done 02/15/15 and showed an EF of 60% with increased wall thickness. Mild MR along with mild TR. This is stable from a previous echo done on 08/15/14.  Was last admitted on 11/17/15 with COPD exacerbation. Was placed on bipap due to elevated CO2 levels with good response. Was given IV steroids and discharged with PO steroids and antibiotics. Admission prior to this was on 02/11/15 with a COPD exacerbation.   He presents today for a follow-up visit with fatigue and shortness of breath with moderate exertion. Denies any swelling in his legs. Has been exercising 3-4 times a week and his weight has begun to decrease. Continues to smoke but on an infrequent basis. Does have a little bit of a cough but feels like it's related to allergies.  Past Medical History:  Diagnosis Date  . COPD (chronic obstructive pulmonary disease) ([place])   . Coronary artery disease   . Diabetes mellitus without complication ([place])   . Hypercholesteremia unk  . Hypertension   . Sarcoidosis ([place])   . Sleep apnea    does not use CPAP regularly.  . Stroke [facility])    Past Surgical History:  Procedure Laterality Date  . APPENDECTOMY     Family History  Problem Relation Age of Onset  . Hypertension Mother   . Heart disease Mother   . Diabetes Mother   . Cancer Father    Social History  Substance Use Topics  . Smoking status: Former Smoker    Packs/day: 0.33    Types: Cigarettes    Quit date: 02/05/2015  . Smokeless tobacco: Never Used  . Alcohol use 0.0 oz/week     Comment: occassional   Allergies  Allergen Reactions  . Penicillins Anaphylaxis and Other (See Comments)    Unable to obtain enough information to answer additional questions about  this medication.     Prior to Admission medications   Medication Sig Start Date End Date Taking? Authorizing Provider  albuterol (PROVENTIL) (2.5 MG/3ML) 0.083% nebulizer solution Take 2.5 mg by nebulization every 6 (six) hours as needed for wheezing or shortness of breath.   Yes Historical Provider, MD  amLODipine (NORVASC) 5 MG tablet Take 5 mg by mouth daily. 03/12/16  Yes Historical Provider, MD  aspirin 81 MG chewable tablet Chew 81 mg by mouth daily.   Yes Historical Provider, MD  atorvastatin (LIPITOR) 40 MG tablet Take 40 mg by mouth at bedtime.    Yes Historical Provider, MD  Fluticasone-Salmeterol (ADVAIR) 250-50 MCG/DOSE AEPB Inhale 1 puff into the lungs 2 (two) times daily.   Yes Historical Provider, MD  furosemide (LASIX) 80 MG tablet Take 80 mg by mouth daily.   Yes Historical Provider, MD  insulin detemir (LEVEMIR) 100 UNIT/ML injection Inject 40 Units into the skin at bedtime.    Yes Historical Provider, MD  insulin lispro (HUMALOG) 100 UNIT/ML injection Inject 8 Units into the skin 3 (three) times daily with meals. Sliding scale as needed   Yes Historical Provider, MD  ipratropium-albuterol (DUONEB) 0.5-2.5 (3) MG/3ML SOLN Take 3 mLs by nebulization 4 (four) times daily. 02/21/15  Yes Theodoro Grist, MD  isosorbide dinitrate (ISORDIL) 10 MG tablet Take 1 tablet (10 mg total)  by mouth 3 (three) times daily. 04/01/15  Yes Alisa Graff, FNP  lisinopril (PRINIVIL,ZESTRIL) 10 MG tablet Take 10 mg by mouth daily.   Yes Historical Provider, MD  metFORMIN (GLUCOPHAGE-XR) 500 MG 24 hr tablet Take 500 mg by mouth 2 (two) times daily.    Yes Historical Provider, MD  metoprolol (LOPRESSOR) 100 MG tablet Take 50 mg by mouth 2 (two) times daily.    Yes Historical Provider, MD  potassium chloride SA (K-DUR,KLOR-CON) 20 MEQ tablet Take 1 tablet (20 mEq total) by mouth 2 (two) times daily. 04/01/15  Yes Alisa Graff, FNP  tiotropium (SPIRIVA) 18 MCG inhalation capsule Place 18 mcg into inhaler and  inhale daily.   Yes Historical Provider, MD   Review of Systems  Constitutional: Positive for fatigue. Negative for appetite change.  HENT: Negative for congestion, postnasal drip and sore throat.   Eyes: Negative.   Respiratory: Positive for cough and shortness of breath (infrequent). Negative for chest tightness.   Cardiovascular: Negative for chest pain, palpitations and leg swelling.  Gastrointestinal: Negative for abdominal distention and abdominal pain.  Endocrine: Negative.   Genitourinary: Negative.   Musculoskeletal: Negative for back pain and neck pain.  Skin: Negative.   Allergic/Immunologic: Negative.   Neurological: Negative for dizziness and light-headedness.  Hematological: Negative for adenopathy. Does not bruise/bleed easily.  Psychiatric/Behavioral: Negative for dysphoric mood and sleep disturbance (wearing bipap ). The patient is not nervous/anxious.    Vitals:   03/22/16 1148  BP: 130/88  Pulse: 94  Resp: 18  SpO2: 96%  Weight: 290 lb 2 oz (131.6 kg)  Height: 6' (1.829 m)   Wt Readings from Last 3 Encounters:  03/22/16 290 lb 2 oz (131.6 kg)  12/20/15 296 lb (134.3 kg)  11/18/15 277 lb 4.8 oz (125.8 kg)   Lab Results  Component Value Date   CREATININE 1.54 (H) 11/18/2015   CREATININE 1.43 (H) 11/17/2015   CREATININE 1.31 (H) 03/31/2015    Physical Exam  Constitutional: He is oriented to person, place, and time. He appears well-developed and well-nourished.  HENT:  Head: Normocephalic and atraumatic.  Eyes: Conjunctivae are normal. Pupils are equal, round, and reactive to light.  Neck: Normal range of motion. Neck supple. No JVD present.  Cardiovascular: Normal rate and regular rhythm.   Pulmonary/Chest: Effort normal. He has no wheezes. He has no rales.  Abdominal: Soft. He exhibits no distension. There is no tenderness.  Musculoskeletal: He exhibits no edema or tenderness.  Neurological: He is alert and oriented to person, place, and time.  Skin:  Skin is warm and dry.  Psychiatric: He has a normal mood and affect. His behavior is normal. Thought content normal.  Nursing note and vitals reviewed.   Assessment & Plan:  1: Chronic heart failure with preserved ejection fraction- - NYHA class II - euvolemic - weight down 6 pounds from last visit. Reminded to call for an overnight weight gain of >2 pounds or a weekly weight gain of >5 pounds - doing cardio and weights 3-4 times a week - does not take the flu vaccine - saw cardiologist ([place]) on 03/12/16 and returns to him 06/11/16  2: HTN- - BP looks good today  3: COPD- - using bipap at night - saw pulmonologist [name]) on 12/02/15 and returns to him 03/22/16 - last PFT's done 09/20/15 - continues to smoke but says that it's about 7 cigarettes weekly. Complete cessation encouraged  4: Diabetes- - reported glucose last night of 101 -  continues on levemir, humalog and metformin   Patient did not bring his medications nor a list. Each medication was verbally reviewed with the patient and he was encouraged to bring the bottles to every visit to confirm accuracy of list.  Return in 4 months or sooner for any questions/problems before then.

## 2016-03-23 DIAGNOSIS — I5032 Chronic diastolic (congestive) heart failure: Secondary | ICD-10-CM | POA: Insufficient documentation

## 2016-03-25 ENCOUNTER — Emergency Department: Payer: Medicare Other

## 2016-03-25 ENCOUNTER — Encounter: Payer: Self-pay | Admitting: Emergency Medicine

## 2016-03-25 ENCOUNTER — Inpatient Hospital Stay
Admission: EM | Admit: 2016-03-25 | Discharge: 2016-03-28 | DRG: 190 | Disposition: A | Discharge status: Home / Self Care | Payer: Medicare Other | Attending: Internal Medicine | Admitting: Internal Medicine

## 2016-03-25 DIAGNOSIS — I2510 Atherosclerotic heart disease of native coronary artery without angina pectoris: Secondary | ICD-10-CM | POA: Diagnosis present

## 2016-03-25 DIAGNOSIS — Z8249 Family history of ischemic heart disease and other diseases of the circulatory system: Secondary | ICD-10-CM | POA: Diagnosis not present

## 2016-03-25 DIAGNOSIS — Z7982 Long term (current) use of aspirin: Secondary | ICD-10-CM | POA: Diagnosis not present

## 2016-03-25 DIAGNOSIS — E785 Hyperlipidemia, unspecified: Secondary | ICD-10-CM | POA: Diagnosis present

## 2016-03-25 DIAGNOSIS — Z809 Family history of malignant neoplasm, unspecified: Secondary | ICD-10-CM

## 2016-03-25 DIAGNOSIS — J9600 Acute respiratory failure, unspecified whether with hypoxia or hypercapnia: Secondary | ICD-10-CM | POA: Diagnosis present

## 2016-03-25 DIAGNOSIS — Z8673 Personal history of transient ischemic attack (TIA), and cerebral infarction without residual deficits: Secondary | ICD-10-CM | POA: Diagnosis not present

## 2016-03-25 DIAGNOSIS — D869 Sarcoidosis, unspecified: Secondary | ICD-10-CM | POA: Diagnosis present

## 2016-03-25 DIAGNOSIS — N189 Chronic kidney disease, unspecified: Secondary | ICD-10-CM | POA: Diagnosis present

## 2016-03-25 DIAGNOSIS — Z9981 Dependence on supplemental oxygen: Secondary | ICD-10-CM

## 2016-03-25 DIAGNOSIS — E1122 Type 2 diabetes mellitus with diabetic chronic kidney disease: Secondary | ICD-10-CM | POA: Diagnosis present

## 2016-03-25 DIAGNOSIS — Z833 Family history of diabetes mellitus: Secondary | ICD-10-CM

## 2016-03-25 DIAGNOSIS — J069 Acute upper respiratory infection, unspecified: Secondary | ICD-10-CM

## 2016-03-25 DIAGNOSIS — E7800 Pure hypercholesterolemia, unspecified: Secondary | ICD-10-CM | POA: Diagnosis present

## 2016-03-25 DIAGNOSIS — J441 Chronic obstructive pulmonary disease with (acute) exacerbation: Secondary | ICD-10-CM | POA: Diagnosis present

## 2016-03-25 DIAGNOSIS — Z794 Long term (current) use of insulin: Secondary | ICD-10-CM

## 2016-03-25 DIAGNOSIS — Z7951 Long term (current) use of inhaled steroids: Secondary | ICD-10-CM | POA: Diagnosis not present

## 2016-03-25 DIAGNOSIS — G4733 Obstructive sleep apnea (adult) (pediatric): Secondary | ICD-10-CM | POA: Diagnosis present

## 2016-03-25 DIAGNOSIS — I129 Hypertensive chronic kidney disease with stage 1 through stage 4 chronic kidney disease, or unspecified chronic kidney disease: Secondary | ICD-10-CM | POA: Diagnosis present

## 2016-03-25 DIAGNOSIS — Z87891 Personal history of nicotine dependence: Secondary | ICD-10-CM

## 2016-03-25 LAB — CBC
HCT: 43.4 % (ref 40.0–52.0)
Hemoglobin: 14.8 g/dL (ref 13.0–18.0)
MCH: 28.9 pg (ref 26.0–34.0)
MCHC: 34.1 g/dL (ref 32.0–36.0)
MCV: 84.7 fL (ref 80.0–100.0)
PLATELETS: 187 10*3/uL (ref 150–440)
RBC: 5.12 MIL/uL (ref 4.40–5.90)
RDW: 13.7 % (ref 11.5–14.5)
WBC: 4.6 10*3/uL (ref 3.8–10.6)

## 2016-03-25 LAB — BASIC METABOLIC PANEL
Anion gap: 6 (ref 5–15)
BUN: 11 mg/dL (ref 6–20)
CHLORIDE: 102 mmol/L (ref 101–111)
CO2: 31 mmol/L (ref 22–32)
CREATININE: 1.29 mg/dL — AB (ref 0.61–1.24)
Calcium: 8.2 mg/dL — ABNORMAL LOW (ref 8.9–10.3)
GFR calc Af Amer: >60 mL/min (ref >60)
GFR calc non Af Amer: >60 mL/min (ref >60)
GLUCOSE: 166 mg/dL — AB (ref 65–99)
Potassium: 4.1 mmol/L (ref 3.5–5.1)
SODIUM: 139 mmol/L (ref 135–145)

## 2016-03-25 LAB — INFLUENZA PANEL BY PCR (TYPE A & B)
Influenza A By PCR: NEGATIVE
Influenza B By PCR: NEGATIVE

## 2016-03-25 MED ORDER — ALBUTEROL SULFATE (2.5 MG/3ML) 0.083% IN NEBU
2.5000 mg | INHALATION_SOLUTION | RESPIRATORY_TRACT | Status: AC
  Administered 2016-03-25: 2.5 mg via RESPIRATORY_TRACT
  Filled 2016-03-25 (×2): qty 3

## 2016-03-25 MED ORDER — METHYLPREDNISOLONE SODIUM SUCC 125 MG IJ SOLR
125.0000 mg | INTRAMUSCULAR | Status: AC
  Administered 2016-03-25: 125 mg via INTRAVENOUS
  Filled 2016-03-25 (×2): qty 2

## 2016-03-25 MED ORDER — ALBUTEROL SULFATE (2.5 MG/3ML) 0.083% IN NEBU
INHALATION_SOLUTION | RESPIRATORY_TRACT | Status: AC
  Administered 2016-03-25: 5 mg via RESPIRATORY_TRACT
  Filled 2016-03-25: qty 3

## 2016-03-25 MED ORDER — ALBUTEROL SULFATE (2.5 MG/3ML) 0.083% IN NEBU
5.0000 mg | INHALATION_SOLUTION | RESPIRATORY_TRACT | Status: AC
  Administered 2016-03-25: 5 mg via RESPIRATORY_TRACT

## 2016-03-25 MED ORDER — IPRATROPIUM-ALBUTEROL 0.5-2.5 (3) MG/3ML IN SOLN
RESPIRATORY_TRACT | Status: AC
  Administered 2016-03-25: 3 mL via RESPIRATORY_TRACT
  Filled 2016-03-25: qty 3

## 2016-03-25 MED ORDER — IPRATROPIUM-ALBUTEROL 0.5-2.5 (3) MG/3ML IN SOLN
3.0000 mL | Freq: Once | RESPIRATORY_TRACT | Status: AC
  Administered 2016-03-25: 3 mL via RESPIRATORY_TRACT

## 2016-03-25 MED ORDER — ALBUTEROL SULFATE (2.5 MG/3ML) 0.083% IN NEBU
5.0000 mg | INHALATION_SOLUTION | Freq: Once | RESPIRATORY_TRACT | Status: AC
  Administered 2016-03-25: 5 mg via RESPIRATORY_TRACT
  Filled 2016-03-25: qty 6

## 2016-03-25 MED ORDER — IPRATROPIUM-ALBUTEROL 0.5-2.5 (3) MG/3ML IN SOLN
3.0000 mL | Freq: Once | RESPIRATORY_TRACT | Status: AC
  Administered 2016-03-25: 3 mL via RESPIRATORY_TRACT
  Filled 2016-03-25: qty 3

## 2016-03-25 MED ORDER — AZITHROMYCIN 250 MG PO TABS
500.0000 mg | ORAL_TABLET | Freq: Once | ORAL | Status: AC
  Administered 2016-03-25: 500 mg via ORAL
  Filled 2016-03-25: qty 2

## 2016-03-25 NOTE — ED Provider Notes (Signed)
[facility] Emergency Department Provider Note   ____________________________________________   First MD Initiated Contact with Patient 03/25/16 2033     (approximate)  I have reviewed the triage vital signs and the nursing notes.   HISTORY  Chief Complaint Shortness of Breath and Cough   HPI [name] is a 46 y.o. male history of COPD  Patient reports for about the last 3-4 days he's had cough, runny nose, wheezing, and his symptoms are not getting better with nebulizer treatments at home.  He saw his pulmonologist Thursday, but his symptoms started that afternoon/Friday morning.  No chest pain. Does report shortness of breath, especially when he tries to walk. His uses oxygen at home, but only when exerting himself, not while at rest  Slight chills, feeling feverish at times.   Has been having wheezing frequently.   Past Medical History:  Diagnosis Date  . COPD (chronic obstructive pulmonary disease) ([place])   . Coronary artery disease   . Diabetes mellitus without complication ([place])   . Hypercholesteremia unk  . Hypertension   . Sarcoidosis ([place])   . Sleep apnea    does not use CPAP regularly.  . Stroke [facility])     Patient Active Problem List   Diagnosis Date Noted  . Chronic diastolic heart failure ([place]) 03/23/2016  . Hypercarbia   . COPD exacerbation ([place]) 11/17/2015  . HTN (hypertension) 06/14/2015  . Dental caries 03/08/2015  . Morbid obesity ([place]) 02/21/2015  . OSA and COPD overlap syndrome ([place]) 02/21/2015  . Thrombocytopenia ([place]) 02/21/2015  . Hypernatremia 02/21/2015  . Diabetes ([place]) 10/19/2014  . Chronic combined systolic and diastolic congestive heart failure ([place]) 10/18/2014  . Chronic obstructive pulmonary disease ([place]) 10/18/2014  . Sarcoidosis ([place]) 04/22/2013  . Other and unspecified hyperlipidemia 04/22/2013    Past Surgical History:  Procedure Laterality Date  . APPENDECTOMY      Prior to Admission  medications   Medication Sig Start Date End Date Taking? Authorizing Provider  albuterol (PROVENTIL) (2.5 MG/3ML) 0.083% nebulizer solution Take 2.5 mg by nebulization every 6 (six) hours as needed for wheezing or shortness of breath.   Yes Historical Provider, MD  amLODipine (NORVASC) 5 MG tablet Take 5 mg by mouth daily. 03/12/16  Yes Historical Provider, MD  aspirin 81 MG chewable tablet Chew 81 mg by mouth daily.   Yes Historical Provider, MD  atorvastatin (LIPITOR) 40 MG tablet Take 40 mg by mouth at bedtime.    Yes Historical Provider, MD  Fluticasone-Salmeterol (ADVAIR) 250-50 MCG/DOSE AEPB Inhale 1 puff into the lungs 2 (two) times daily.   Yes Historical Provider, MD  furosemide (LASIX) 80 MG tablet Take 80 mg by mouth daily.   Yes Historical Provider, MD  insulin detemir (LEVEMIR) 100 UNIT/ML injection Inject 40 Units into the skin at bedtime.    Yes Historical Provider, MD  insulin lispro (HUMALOG) 100 UNIT/ML injection Inject 8 Units into the skin 3 (three) times daily with meals. Sliding scale as needed   Yes Historical Provider, MD  ipratropium-albuterol (DUONEB) 0.5-2.5 (3) MG/3ML SOLN Take 3 mLs by nebulization 4 (four) times daily. 02/21/15  Yes Theodoro Grist, MD  isosorbide dinitrate (ISORDIL) 10 MG tablet Take 1 tablet (10 mg total) by mouth 3 (three) times daily. 04/01/15  Yes Alisa Graff, FNP  lisinopril (PRINIVIL,ZESTRIL) 10 MG tablet Take 10 mg by mouth daily.   Yes Historical Provider, MD  metFORMIN (GLUCOPHAGE-XR) 500 MG 24 hr tablet Take 500 mg by mouth  daily with breakfast.    Yes Historical Provider, MD  metoprolol (LOPRESSOR) 100 MG tablet Take 50 mg by mouth 2 (two) times daily.    Yes Historical Provider, MD  potassium chloride SA (K-DUR,KLOR-CON) 20 MEQ tablet Take 1 tablet (20 mEq total) by mouth 2 (two) times daily. 04/01/15  Yes Alisa Graff, FNP  tiotropium (SPIRIVA) 18 MCG inhalation capsule Place 18 mcg into inhaler and inhale daily.   Yes Historical Provider, MD     Allergies Penicillins  Family History  Problem Relation Age of Onset  . Hypertension Mother   . Heart disease Mother   . Diabetes Mother   . Cancer Father     Social History Social History  Substance Use Topics  . Smoking status: Former Smoker    Packs/day: 0.33    Types: Cigarettes    Quit date: 02/05/2015  . Smokeless tobacco: Never Used  . Alcohol use 0.0 oz/week     Comment: occassional    Review of Systems Constitutional: See history of present illness Eyes: No visual changes. ENT: No sore throat. Scratchy. Cardiovascular: Denies chest pain. Respiratory: See history of present illness Gastrointestinal: No abdominal pain.  No nausea, no vomiting.  No diarrhea.  No constipation. Genitourinary: Negative for dysuria. Musculoskeletal: Negative for back pain. Skin: Negative for rash. Neurological: Negative for headaches, focal weakness or numbness.  10-point ROS otherwise negative.  ____________________________________________   PHYSICAL EXAM:  VITAL SIGNS: ED Triage Vitals  Enc Vitals Group     BP 03/25/16 1558 (!) 143/86     Pulse Rate 03/25/16 1558 100     Resp 03/25/16 1558 18     Temp 03/25/16 1558 99.1 F (37.3 C)     Temp Source 03/25/16 1558 Oral     SpO2 03/25/16 1558 95 %     Weight 03/25/16 1558 280 lb (127 kg)     Height 03/25/16 1558 6' (1.829 m)     Head Circumference --      Peak Flow --      Pain Score 03/25/16 1600 0     Pain Loc --      Pain Edu? --      Excl. in [place]? --     Constitutional: Alert and oriented.Mildly dyspneic, pleasant, and in no obvious distress Eyes: Conjunctivae are normal. PERRL. EOMI. Head: Atraumatic. Nose: No congestion/rhinnorhea. Mouth/Throat: Mucous membranes are moist.  Oropharynx non-erythematous. Neck: No stridor.   Cardiovascular: Slightly tachycardic rate, regular rhythm. Grossly normal heart sounds.  Good peripheral circulation. Respiratory: Mild use of accessory muscles, speaks in phrases,  moderate end expiratory wheezing throughout all lung zones, without rales. Frequent dry, but somewhat rhonchorous cough is heard. Gastrointestinal: Soft and nontender. No distention. No abdominal bruits. No CVA tenderness. Musculoskeletal: No lower extremity tenderness nor edema.  No joint effusions. Neurologic:  Normal speech and language. No gross focal neurologic deficits are appreciated.  Skin:  Skin is warm, dry and intact. No rash noted. Psychiatric: Mood and affect are normal. Speech and behavior are normal.  ____________________________________________   LABS (all labs ordered are listed, but only abnormal results are displayed)  Labs Reviewed  BASIC METABOLIC PANEL - Abnormal; Notable for the following:       Result Value   Glucose, Bld 166 (*)    Creatinine, Ser 1.29 (*)    Calcium 8.2 (*)    All other components within normal limits  CBC  INFLUENZA PANEL BY PCR (TYPE A & B)   ____________________________________________  EKG  Reviewed and interpreted by me at 1610 Heart rate 100 QRS 150 QTC 500 Normal sinus rhythm, bifascicular block, slight prolongation of the QT likely attributed to block As compared with the previous EKG from 12 of October 2017, no significant changes are noted ____________________________________________  RADIOLOGY  Dg Chest 2 View  Result Date: 03/25/2016 CLINICAL DATA:  Shortness of breath with nonproductive cough EXAM: CHEST  2 VIEW COMPARISON:  11/17/2015 FINDINGS: Stable elevation of left diaphragm with presumed scarring at the left base. Additional areas of bandlike scarring within the bilateral lungs as before. No definite acute infiltrate. Stable cardiomediastinal silhouette. No pneumothorax. Stable round opacity lower right hilar area likely related to pulmonary vasculature. IMPRESSION: Stable areas of pleural and parenchymal scarring. No acute infiltrates. Electronically Signed   By: Donavan Foil M.D.   On: 03/25/2016 19:40     ____________________________________________   PROCEDURES  Procedure(s) performed: None  Procedures  Critical Care performed: No  ____________________________________________   INITIAL IMPRESSION / ASSESSMENT AND PLAN / ED COURSE  Pertinent labs & imaging results that were available during my care of the patient were reviewed by me and considered in my medical decision making (see chart for details).  Dyspnea, cough. Most consistent with upper respiratory type symptomatology. The history of COPD, appears also consistent with exacerbation. No acute cardiac symptoms. He does exhibit mild increased oxygen deficit, reports he does not use oxygen while at rest at home, but here had oxygen saturation of 88% on 2 L at one point, this responded to nebulizer treatment, and he is reporting improvement. No acute extremities. No clear indications to start BiPAP. He is showing improvement with treatments. No pleuritic pain, no chest pain. No secondary signs of DVT, and I doubt pulmonary embolism, dissection, pneumothorax, acute coronary syndrome as cause of symptoms  Clinical Course as of Mar 25 2233  Sun Mar 25, 2016  2230 Patient reporting some improvement shortness of breath. He continues to have mild use of accessory muscles, mild ongoing, but improved wheezing. He does speak in phrases. Continues to appear having ongoing COPD exacerbation, not having any hypoxia now, but does appear to have ongoing symptomatology and the patient reports he feels as though he needs to be admitted to the hospital again. I think this is reasonable, and we'll admit him for possible COPD/asthma type exacerbation.  [MQ]    Clinical Course User Index [MQ] Delman Kitten, MD   Azithromycin given. QT corrects after accounting for block.   ____________________________________________   FINAL CLINICAL IMPRESSION(S) / ED DIAGNOSES  Final diagnoses:  COPD exacerbation ([place])  Upper respiratory tract infection,  unspecified type      NEW MEDICATIONS STARTED DURING THIS VISIT:  New Prescriptions   No medications on file     Note:  This document was prepared using Dragon voice recognition software and may include unintentional dictation errors.     Delman Kitten, MD 03/25/16 2238

## 2016-03-25 NOTE — ED Triage Notes (Signed)
Patient presents to the ED with increased shortness of breath since Friday with non-productive cough and congestion.  Patient has expiratory wheezes bilaterally.  Patient reports increased shortness of breath with exertion.  Patient has history of COPD and CHF.  Patient denies known fever.  No obvious distress at this time.

## 2016-03-26 ENCOUNTER — Encounter: Payer: Self-pay | Admitting: *Deleted

## 2016-03-26 LAB — BASIC METABOLIC PANEL
Anion gap: 5 (ref 5–15)
BUN: 11 mg/dL (ref 6–20)
CALCIUM: 8.2 mg/dL — AB (ref 8.9–10.3)
CHLORIDE: 101 mmol/L (ref 101–111)
CO2: 32 mmol/L (ref 22–32)
CREATININE: 1.52 mg/dL — AB (ref 0.61–1.24)
GFR calc non Af Amer: 54 mL/min — ABNORMAL LOW (ref >60)
Glucose, Bld: 271 mg/dL — ABNORMAL HIGH (ref 65–99)
Potassium: 5.1 mmol/L (ref 3.5–5.1)
SODIUM: 138 mmol/L (ref 135–145)

## 2016-03-26 LAB — GLUCOSE, CAPILLARY
GLUCOSE-CAPILLARY: 189 mg/dL — AB (ref 65–99)
GLUCOSE-CAPILLARY: 249 mg/dL — AB (ref 65–99)
GLUCOSE-CAPILLARY: 257 mg/dL — AB (ref 65–99)
Glucose-Capillary: 205 mg/dL — ABNORMAL HIGH (ref 65–99)
Glucose-Capillary: 220 mg/dL — ABNORMAL HIGH (ref 65–99)

## 2016-03-26 LAB — CBC
HCT: 44.2 % (ref 40.0–52.0)
Hemoglobin: 14.4 g/dL (ref 13.0–18.0)
MCH: 27.9 pg (ref 26.0–34.0)
MCHC: 32.6 g/dL (ref 32.0–36.0)
MCV: 85.7 fL (ref 80.0–100.0)
PLATELETS: 169 10*3/uL (ref 150–440)
RBC: 5.16 MIL/uL (ref 4.40–5.90)
RDW: 13.8 % (ref 11.5–14.5)
WBC: 4.9 10*3/uL (ref 3.8–10.6)

## 2016-03-26 LAB — MRSA PCR SCREENING: MRSA by PCR: POSITIVE — AB

## 2016-03-26 LAB — PHOSPHORUS: Phosphorus: 2.4 mg/dL — ABNORMAL LOW (ref 2.5–4.6)

## 2016-03-26 LAB — MAGNESIUM: MAGNESIUM: 2.1 mg/dL (ref 1.7–2.4)

## 2016-03-26 MED ORDER — OXYCODONE HCL 5 MG PO TABS: 5.0000 mg | ORAL_TABLET | ORAL | Status: DC | PRN

## 2016-03-26 MED ORDER — IPRATROPIUM BROMIDE 0.02 % IN SOLN: 0.5000 mg | Freq: Four times a day (QID) | RESPIRATORY_TRACT | Status: DC | PRN

## 2016-03-26 MED ORDER — AZITHROMYCIN 500 MG IV SOLR
500.0000 mg | INTRAVENOUS | Status: DC
  Filled 2016-03-26: qty 500

## 2016-03-26 MED ORDER — BISACODYL 5 MG PO TBEC: 5.0000 mg | DELAYED_RELEASE_TABLET | Freq: Every day | ORAL | Status: DC | PRN

## 2016-03-26 MED ORDER — CHLORHEXIDINE GLUCONATE 0.12 % MT SOLN
15.0000 mL | Freq: Two times a day (BID) | OROMUCOSAL | Status: DC
  Administered 2016-03-26 – 2016-03-28 (×5): 15 mL via OROMUCOSAL
  Filled 2016-03-26 (×5): qty 15

## 2016-03-26 MED ORDER — METHYLPREDNISOLONE SODIUM SUCC 40 MG IJ SOLR
40.0000 mg | Freq: Two times a day (BID) | INTRAMUSCULAR | Status: DC
  Administered 2016-03-26 – 2016-03-28 (×4): 40 mg via INTRAVENOUS
  Filled 2016-03-26 (×4): qty 1

## 2016-03-26 MED ORDER — AZITHROMYCIN 250 MG PO TABS
500.0000 mg | ORAL_TABLET | Freq: Every day | ORAL | Status: DC
  Administered 2016-03-26 – 2016-03-27 (×2): 500 mg via ORAL
  Filled 2016-03-26 (×2): qty 2

## 2016-03-26 MED ORDER — DEXTROMETHORPHAN POLISTIREX ER 30 MG/5ML PO SUER
30.0000 mg | Freq: Two times a day (BID) | ORAL | Status: DC
  Administered 2016-03-26 – 2016-03-27 (×5): 30 mg via ORAL
  Filled 2016-03-26 (×7): qty 5

## 2016-03-26 MED ORDER — ACETAMINOPHEN 650 MG RE SUPP: 650.0000 mg | Freq: Four times a day (QID) | RECTAL | Status: DC | PRN

## 2016-03-26 MED ORDER — SODIUM CHLORIDE 0.9 % IV SOLN
INTRAVENOUS | Status: DC
  Administered 2016-03-26: 01:00:00 via INTRAVENOUS

## 2016-03-26 MED ORDER — INSULIN DETEMIR 100 UNIT/ML ~~LOC~~ SOLN
40.0000 [IU] | Freq: Every day | SUBCUTANEOUS | Status: DC
  Administered 2016-03-26 – 2016-03-27 (×3): 40 [IU] via SUBCUTANEOUS
  Filled 2016-03-26 (×5): qty 0.4

## 2016-03-26 MED ORDER — ALBUTEROL SULFATE (2.5 MG/3ML) 0.083% IN NEBU: 2.5000 mg | INHALATION_SOLUTION | Freq: Four times a day (QID) | RESPIRATORY_TRACT | Status: DC | PRN

## 2016-03-26 MED ORDER — METOPROLOL TARTRATE 50 MG PO TABS
50.0000 mg | ORAL_TABLET | Freq: Two times a day (BID) | ORAL | Status: DC
  Administered 2016-03-26 – 2016-03-28 (×6): 50 mg via ORAL
  Filled 2016-03-26 (×6): qty 1

## 2016-03-26 MED ORDER — MAGNESIUM CITRATE PO SOLN: 1.0000 | Freq: Once | ORAL | Status: DC | PRN

## 2016-03-26 MED ORDER — ONDANSETRON HCL 4 MG/2ML IJ SOLN: 4.0000 mg | Freq: Four times a day (QID) | INTRAMUSCULAR | Status: DC | PRN

## 2016-03-26 MED ORDER — INSULIN ASPART 100 UNIT/ML ~~LOC~~ SOLN
0.0000 [IU] | Freq: Three times a day (TID) | SUBCUTANEOUS | Status: DC
  Administered 2016-03-26 (×2): 7 [IU] via SUBCUTANEOUS
  Administered 2016-03-26 – 2016-03-27 (×2): 4 [IU] via SUBCUTANEOUS
  Administered 2016-03-27: 11 [IU] via SUBCUTANEOUS
  Administered 2016-03-27: 7 [IU] via SUBCUTANEOUS
  Administered 2016-03-28 (×2): 15 [IU] via SUBCUTANEOUS
  Filled 2016-03-26: qty 2
  Filled 2016-03-26: qty 15
  Filled 2016-03-26: qty 4
  Filled 2016-03-26: qty 11
  Filled 2016-03-26: qty 7
  Filled 2016-03-26: qty 15
  Filled 2016-03-26: qty 5
  Filled 2016-03-26: qty 15
  Filled 2016-03-26: qty 4
  Filled 2016-03-26: qty 11

## 2016-03-26 MED ORDER — ENOXAPARIN SODIUM 40 MG/0.4ML ~~LOC~~ SOLN
40.0000 mg | Freq: Every day | SUBCUTANEOUS | Status: DC
  Filled 2016-03-26 (×3): qty 0.4

## 2016-03-26 MED ORDER — ACETAMINOPHEN 325 MG PO TABS: 650.0000 mg | ORAL_TABLET | Freq: Four times a day (QID) | ORAL | Status: DC | PRN

## 2016-03-26 MED ORDER — GUAIFENESIN ER 600 MG PO TB12
600.0000 mg | ORAL_TABLET | Freq: Two times a day (BID) | ORAL | Status: DC
  Administered 2016-03-26 – 2016-03-28 (×5): 600 mg via ORAL
  Filled 2016-03-26 (×6): qty 1

## 2016-03-26 MED ORDER — DM-GUAIFENESIN ER 30-600 MG PO TB12: 1.0000 | ORAL_TABLET | Freq: Two times a day (BID) | ORAL | Status: DC

## 2016-03-26 MED ORDER — ATORVASTATIN CALCIUM 20 MG PO TABS
40.0000 mg | ORAL_TABLET | Freq: Every day | ORAL | Status: DC
  Administered 2016-03-26 – 2016-03-27 (×3): 40 mg via ORAL
  Filled 2016-03-26 (×3): qty 2

## 2016-03-26 MED ORDER — ORAL CARE MOUTH RINSE
15.0000 mL | Freq: Two times a day (BID) | OROMUCOSAL | Status: DC
  Administered 2016-03-26 – 2016-03-28 (×5): 15 mL via OROMUCOSAL

## 2016-03-26 MED ORDER — ISOSORBIDE DINITRATE 10 MG PO TABS
10.0000 mg | ORAL_TABLET | Freq: Three times a day (TID) | ORAL | Status: DC
  Administered 2016-03-26 – 2016-03-28 (×7): 10 mg via ORAL
  Filled 2016-03-26 (×10): qty 1

## 2016-03-26 MED ORDER — ASPIRIN 81 MG PO CHEW
81.0000 mg | CHEWABLE_TABLET | Freq: Every day | ORAL | Status: DC
  Administered 2016-03-26 – 2016-03-28 (×3): 81 mg via ORAL
  Filled 2016-03-26 (×3): qty 1

## 2016-03-26 MED ORDER — INSULIN ASPART 100 UNIT/ML ~~LOC~~ SOLN
0.0000 [IU] | Freq: Every day | SUBCUTANEOUS | Status: DC
Start: 2016-03-26 — End: 2016-03-28
  Administered 2016-03-26 (×2): 2 [IU] via SUBCUTANEOUS
  Administered 2016-03-27: 4 [IU] via SUBCUTANEOUS
  Filled 2016-03-26: qty 4
  Filled 2016-03-26 (×2): qty 2

## 2016-03-26 MED ORDER — METHYLPREDNISOLONE SODIUM SUCC 125 MG IJ SOLR
60.0000 mg | Freq: Four times a day (QID) | INTRAMUSCULAR | Status: DC
  Administered 2016-03-26 (×2): 60 mg via INTRAVENOUS
  Filled 2016-03-26 (×2): qty 2

## 2016-03-26 MED ORDER — MOMETASONE FURO-FORMOTEROL FUM 200-5 MCG/ACT IN AERO
2.0000 | INHALATION_SPRAY | Freq: Two times a day (BID) | RESPIRATORY_TRACT | Status: DC
  Administered 2016-03-26 – 2016-03-28 (×6): 2 via RESPIRATORY_TRACT
  Filled 2016-03-26: qty 8.8

## 2016-03-26 MED ORDER — FUROSEMIDE 80 MG PO TABS
80.0000 mg | ORAL_TABLET | Freq: Every day | ORAL | Status: DC
  Administered 2016-03-26 – 2016-03-28 (×3): 80 mg via ORAL
  Filled 2016-03-26 (×3): qty 1

## 2016-03-26 MED ORDER — LISINOPRIL 10 MG PO TABS
10.0000 mg | ORAL_TABLET | Freq: Every day | ORAL | Status: DC
  Administered 2016-03-26 – 2016-03-27 (×2): 10 mg via ORAL
  Filled 2016-03-26 (×2): qty 1

## 2016-03-26 MED ORDER — INSULIN ASPART 100 UNIT/ML ~~LOC~~ SOLN
4.0000 [IU] | Freq: Three times a day (TID) | SUBCUTANEOUS | Status: DC
  Administered 2016-03-26 – 2016-03-28 (×7): 4 [IU] via SUBCUTANEOUS
  Filled 2016-03-26 (×2): qty 4

## 2016-03-26 MED ORDER — MUPIROCIN 2 % EX OINT
1.0000 "application " | TOPICAL_OINTMENT | Freq: Two times a day (BID) | CUTANEOUS | Status: DC
  Administered 2016-03-26 – 2016-03-28 (×4): 1 via NASAL
  Filled 2016-03-26: qty 22

## 2016-03-26 MED ORDER — CHLORHEXIDINE GLUCONATE CLOTH 2 % EX PADS
6.0000 | MEDICATED_PAD | Freq: Every day | CUTANEOUS | Status: DC
  Administered 2016-03-27 – 2016-03-28 (×2): 6 via TOPICAL

## 2016-03-26 MED ORDER — POTASSIUM CHLORIDE CRYS ER 20 MEQ PO TBCR
20.0000 meq | EXTENDED_RELEASE_TABLET | Freq: Two times a day (BID) | ORAL | Status: DC
  Administered 2016-03-26 (×2): 20 meq via ORAL
  Filled 2016-03-26 (×2): qty 1

## 2016-03-26 MED ORDER — ONDANSETRON HCL 4 MG PO TABS: 4.0000 mg | ORAL_TABLET | Freq: Four times a day (QID) | ORAL | Status: DC | PRN

## 2016-03-26 MED ORDER — IPRATROPIUM-ALBUTEROL 0.5-2.5 (3) MG/3ML IN SOLN
3.0000 mL | Freq: Four times a day (QID) | RESPIRATORY_TRACT | Status: DC
  Administered 2016-03-26 – 2016-03-28 (×8): 3 mL via RESPIRATORY_TRACT
  Filled 2016-03-26 (×8): qty 3

## 2016-03-26 MED ORDER — SENNOSIDES-DOCUSATE SODIUM 8.6-50 MG PO TABS: 1.0000 | ORAL_TABLET | Freq: Every evening | ORAL | Status: DC | PRN

## 2016-03-26 MED ORDER — AMLODIPINE BESYLATE 5 MG PO TABS
5.0000 mg | ORAL_TABLET | Freq: Every day | ORAL | Status: DC
  Administered 2016-03-26 – 2016-03-28 (×3): 5 mg via ORAL
  Filled 2016-03-26 (×3): qty 1

## 2016-03-26 MED ORDER — TIOTROPIUM BROMIDE MONOHYDRATE 18 MCG IN CAPS
18.0000 ug | ORAL_CAPSULE | Freq: Every day | RESPIRATORY_TRACT | Status: DC
  Administered 2016-03-26 – 2016-03-28 (×3): 18 ug via RESPIRATORY_TRACT
  Filled 2016-03-26: qty 5

## 2016-03-26 NOTE — H&P (Signed)
History and Physical   [facility] - [place] @ [facility] Admission History and Physical [employer], D.O.    Patient Name: [name] MR#: [id number] Date of Birth: 08-28-1970 Date of Admission: 03/25/2016  Referring MD/NP/PA: Dr. Jacqualine Code Primary Care Physician: Sabino Snipes KEY, MD  Patient coming from: Home   Chief Complaint: SOB  HPI: [name] is a 46 y.o. male with a known history of COPD, coronary artery disease, diabetes, hypertension, hyperlipidemia, sarcoidosis, obstructive sleep apnea, CVA, CKD was in a usual state of health until 3 days ago when he describes cough occasionally productive, nasal and chest congestion associated with wheezing and shortness of breath. His symptoms were refractory to home nebulizer treatments. He did see his pulmonologist last week with no changes to his medications.   Otherwise there has been no change in status. Patient has been taking medication as prescribed and there has been no recent change in medication or diet.  No recent antibiotics.  There has been no recent illness, hospitalizations, travel or sick contacts.    Patient denies fevers/chills, weakness, dizziness, chest pain, N/V/C/D, abdominal pain, dysuria/frequency, changes in mental status.   ED Course: Patient received 2 DuoNeb's and 3 albuterol treatments, Solu-Medrol, a azithromycin  Review of Systems:  CONSTITUTIONAL: No fever/chills, fatigue, weakness, weight gain/loss, headache. EYES: No blurry or double vision. ENT: No tinnitus, postnasal drip, redness or soreness of the oropharynx. RESPIRATORY: Positive cough, dyspnea, wheeze.  No hemoptysis.  CARDIOVASCULAR: No chest pain, palpitations, syncope, orthopnea. No lower extremity edema.  GASTROINTESTINAL: No nausea, vomiting, abdominal pain, diarrhea, constipation.  No hematemesis, melena or hematochezia. GENITOURINARY: No dysuria, frequency, hematuria. ENDOCRINE: No polyuria or nocturia. No heat or cold  intolerance. HEMATOLOGY: No anemia, bruising, bleeding. INTEGUMENTARY: No rashes, ulcers, lesions. MUSCULOSKELETAL: No arthritis, gout, dyspnea. NEUROLOGIC: No numbness, tingling, ataxia, seizure-type activity, weakness. PSYCHIATRIC: No anxiety, depression, insomnia.   Past Medical History:  Diagnosis Date  . COPD (chronic obstructive pulmonary disease) ([place])   . Coronary artery disease   . Diabetes mellitus without complication ([place])   . Hypercholesteremia unk  . Hypertension   . Sarcoidosis ([place])   . Sleep apnea    does not use CPAP regularly.  . Stroke [facility])     Past Surgical History:  Procedure Laterality Date  . APPENDECTOMY       reports that he quit smoking about 13 months ago. His smoking use included Cigarettes. He smoked 0.33 packs per day. He has never used smokeless tobacco. He reports that he drinks alcohol. He reports that he uses drugs, including Marijuana.  Allergies  Allergen Reactions  . Penicillins Anaphylaxis and Other (See Comments)    Unable to obtain enough information to answer additional questions about this medication.      Family History  Problem Relation Age of Onset  . Hypertension Mother   . Heart disease Mother   . Diabetes Mother   . Cancer Father    Family history has been reviewed and confirmed with patient.   Prior to Admission medications   Medication Sig Start Date End Date Taking? Authorizing Provider  albuterol (PROVENTIL) (2.5 MG/3ML) 0.083% nebulizer solution Take 2.5 mg by nebulization every 6 (six) hours as needed for wheezing or shortness of breath.   Yes Historical Provider, MD  amLODipine (NORVASC) 5 MG tablet Take 5 mg by mouth daily. 03/12/16  Yes Historical Provider, MD  aspirin 81 MG chewable tablet Chew 81 mg by mouth daily.   Yes Historical Provider, MD  atorvastatin (LIPITOR)  40 MG tablet Take 40 mg by mouth at bedtime.    Yes Historical Provider, MD  Fluticasone-Salmeterol (ADVAIR) 250-50 MCG/DOSE AEPB Inhale 1 puff  into the lungs 2 (two) times daily.   Yes Historical Provider, MD  furosemide (LASIX) 80 MG tablet Take 80 mg by mouth daily.   Yes Historical Provider, MD  insulin detemir (LEVEMIR) 100 UNIT/ML injection Inject 40 Units into the skin at bedtime.    Yes Historical Provider, MD  insulin lispro (HUMALOG) 100 UNIT/ML injection Inject 8 Units into the skin 3 (three) times daily with meals. Sliding scale as needed   Yes Historical Provider, MD  ipratropium-albuterol (DUONEB) 0.5-2.5 (3) MG/3ML SOLN Take 3 mLs by nebulization 4 (four) times daily. 02/21/15  Yes Theodoro Grist, MD  isosorbide dinitrate (ISORDIL) 10 MG tablet Take 1 tablet (10 mg total) by mouth 3 (three) times daily. 04/01/15  Yes Alisa Graff, FNP  lisinopril (PRINIVIL,ZESTRIL) 10 MG tablet Take 10 mg by mouth daily.   Yes Historical Provider, MD  metFORMIN (GLUCOPHAGE-XR) 500 MG 24 hr tablet Take 500 mg by mouth daily with breakfast.    Yes Historical Provider, MD  metoprolol (LOPRESSOR) 100 MG tablet Take 50 mg by mouth 2 (two) times daily.    Yes Historical Provider, MD  potassium chloride SA (K-DUR,KLOR-CON) 20 MEQ tablet Take 1 tablet (20 mEq total) by mouth 2 (two) times daily. 04/01/15  Yes Alisa Graff, FNP  tiotropium (SPIRIVA) 18 MCG inhalation capsule Place 18 mcg into inhaler and inhale daily.   Yes Historical Provider, MD    Physical Exam: Vitals:   03/25/16 2044 03/25/16 2045 03/25/16 2308 03/26/16 0012  BP:   130/75 138/85  Pulse:   (!) 102 (!) 103  Resp:   12 17  Temp:    98.4 F (36.9 C)  TempSrc:    Oral  SpO2: (!) 88% 96% 95% 94%  Weight:      Height:        GENERAL: 46 y.o.-year-old Black male patient, well-developed, well-nourished lying in the bed in no acute distress.  Pleasant and cooperative.   HEENT: Head atraumatic, normocephalic. Pupils equal, round, reactive to light and accommodation. No scleral icterus. Extraocular muscles intact. Nares are patent. Oropharynx is clear. Mucus membranes  moist. NECK: Supple, full range of motion. No JVD, no bruit heard. No thyroid enlargement, no tenderness, no cervical lymphadenopathy. CHEST: Mild expiratory wheezes. No use of accessory muscles of respiration.  No reproducible chest wall tenderness.  CARDIOVASCULAR: S1, S2 normal. No murmurs, rubs, or gallops. Cap refill <2 seconds. Pulses intact distally.  ABDOMEN: Soft, nondistended, nontender. No rebound, guarding, rigidity. Normoactive bowel sounds present in all four quadrants. No organomegaly or mass. EXTREMITIES: No pedal edema, cyanosis, or clubbing. No calf tenderness or Homan's sign.  NEUROLOGIC: The patient is alert and oriented x 3. Cranial nerves II through XII are grossly intact with no focal sensorimotor deficit. Muscle strength 5/5 in all extremities. Sensation intact. Gait not checked. PSYCHIATRIC:  Normal affect, mood, thought content. SKIN: Warm, dry, and intact without obvious rash, lesion, or ulcer.    Labs on Admission:  CBC:  Recent Labs Lab 03/25/16 1610  WBC 4.6  HGB 14.8  HCT 43.4  MCV 84.7  PLT 123XX123   Basic Metabolic Panel:  Recent Labs Lab 03/25/16 1610  NA 139  K 4.1  CL 102  CO2 31  GLUCOSE 166*  BUN 11  CREATININE 1.29*  CALCIUM 8.2*   GFR: Estimated Creatinine Clearance:  99.6 mL/min (by C-G formula based on SCr of 1.29 mg/dL (H)). Liver Function Tests: No results for input(s): AST, ALT, ALKPHOS, BILITOT, PROT, ALBUMIN in the last 168 hours. No results for input(s): LIPASE, AMYLASE in the last 168 hours. No results for input(s): AMMONIA in the last 168 hours. Coagulation Profile: No results for input(s): INR, PROTIME in the last 168 hours. Cardiac Enzymes: No results for input(s): CKTOTAL, CKMB, CKMBINDEX, TROPONINI in the last 168 hours. BNP (last 3 results) No results for input(s): PROBNP in the last 8760 hours. HbA1C: No results for input(s): HGBA1C in the last 72 hours. CBG: No results for input(s): GLUCAP in the last 168  hours. Lipid Profile: No results for input(s): CHOL, HDL, LDLCALC, TRIG, CHOLHDL, LDLDIRECT in the last 72 hours. Thyroid Function Tests: No results for input(s): TSH, T4TOTAL, FREET4, T3FREE, THYROIDAB in the last 72 hours. Anemia Panel: No results for input(s): VITAMINB12, FOLATE, FERRITIN, TIBC, IRON, RETICCTPCT in the last 72 hours. Urine analysis:  Sepsis Labs: @LABRCNTIP (procalcitonin:4,lacticidven:4) )No results found for this or any previous visit (from the past 240 hour(s)).   Radiological Exams on Admission: Dg Chest 2 View  Result Date: 03/25/2016 CLINICAL DATA:  Shortness of breath with nonproductive cough EXAM: CHEST  2 VIEW COMPARISON:  11/17/2015 FINDINGS: Stable elevation of left diaphragm with presumed scarring at the left base. Additional areas of bandlike scarring within the bilateral lungs as before. No definite acute infiltrate. Stable cardiomediastinal silhouette. No pneumothorax. Stable round opacity lower right hilar area likely related to pulmonary vasculature. IMPRESSION: Stable areas of pleural and parenchymal scarring. No acute infiltrates. Electronically Signed   By: Donavan Foil M.D.   On: 03/25/2016 19:40    EKG: Sinus tachycardia at 100 bpm with normal axis, prolonged QT and nonspecific ST-T wave changes.   Assessment/Plan Active Problems:   COPD exacerbation (HCC)    This is a 46 y.o. male with a history of COPD, coronary artery disease, diabetes, hypertension, hyperlipidemia, sarcoidosis, obstructive sleep apnea, CVA, CKD now being admitted with:  1. Acute exacerbation of COPD - IV steroids and azithromycin - Nebulizers, O2 therapy and expectorants as needed.  -Continue Advair, Spiriva - Continuous pulse oximetry - Check sputum culture - Consider pulmonary consult if not improving.   2. History of hypertension -Continue Norvasc, Lasix, Imdur, Lopressor  3. History of hyperlipidemia -Continue atorvastatin  4. History of coronary artery  disease -Continue aspirin  5. H/o Diabetes - Accuchecks achs with RISS coverage - Heart healthy, carb controlled diet -Continue Levemir, hold metformin  6. History of OSA - Nocturnal BiPAP Admission status: Inpatient IV Fluids: NS Diet/Nutrition: HH, CC Consults called: None  DVT Px: Lovenox, SCDs and early ambulation. Code Status: Full Code  Disposition Plan: To home in 1-2 days   All the records are reviewed and case discussed with ED provider. Management plans discussed with the patient and/or family who express understanding and agree with plan of care.  [name] D.O. on 03/26/2016 at 12:18 AM Between 7am to 6pm - Pager - [phone] After 6pm go to [email or web address] - [employer] [facility] [place] [facility] Office [phone] CC: Primary care physician; Sabino Snipes KEY, MD   03/26/2016, 12:18 AM

## 2016-03-26 NOTE — Progress Notes (Signed)
[place] [facility] [place] NAME: [name]    MR#:  [id number]  DATE OF BIRTH:  04-Dec-1970  SUBJECTIVE:  Doing better this am sob improved wheezing imporved  REVIEW OF SYSTEMS:    Review of Systems  Constitutional: Negative.  Negative for chills, fever and malaise/fatigue.  HENT: Negative.  Negative for ear discharge, ear pain, hearing loss, nosebleeds and sore throat.   Eyes: Negative.  Negative for blurred vision and pain.  Respiratory: Positive for shortness of breath. Negative for cough, hemoptysis and wheezing.   Cardiovascular: Negative.  Negative for chest pain, palpitations and leg swelling.  Gastrointestinal: Negative.  Negative for abdominal pain, blood in stool, diarrhea, nausea and vomiting.  Genitourinary: Negative.  Negative for dysuria.  Musculoskeletal: Negative.  Negative for back pain.  Skin: Negative.   Neurological: Negative for dizziness, tremors, speech change, focal weakness, seizures and headaches.  Endo/Heme/Allergies: Negative.  Does not bruise/bleed easily.  Psychiatric/Behavioral: Negative.  Negative for depression, hallucinations and suicidal ideas.    Tolerating Diet: yes      DRUG ALLERGIES:   Allergies  Allergen Reactions  . Penicillins Anaphylaxis and Other (See Comments)    Unable to obtain enough information to answer additional questions about this medication.      VITALS:  Blood pressure 137/84, pulse 88, temperature 97.7 F (36.5 C), temperature source Oral, resp. rate 18, height 6' (1.829 m), weight 131.1 kg (289 lb), SpO2 100 %.  PHYSICAL EXAMINATION:   Physical Exam  Constitutional: He is oriented to person, place, and time and well-developed, well-nourished, and in no distress. No distress.  HENT:  Head: Normocephalic.  Eyes: No scleral icterus.  Neck: Normal range of motion. Neck supple. No JVD present. No tracheal deviation present.  Cardiovascular: Normal rate, regular rhythm and  normal heart sounds.  Exam reveals no gallop and no friction rub.   No murmur heard. Pulmonary/Chest: Effort normal. No respiratory distress. He has no wheezes. He has no rales. He exhibits no tenderness.  Decreased throughout  Abdominal: Soft. Bowel sounds are normal. He exhibits no distension and no mass. There is no tenderness. There is no rebound and no guarding.  Musculoskeletal: Normal range of motion. He exhibits no edema.  Neurological: He is alert and oriented to person, place, and time.  Skin: Skin is warm. No rash noted. No erythema.  Psychiatric: Affect and judgment normal.      LABORATORY PANEL:   CBC  Recent Labs Lab 03/26/16 0345  WBC 4.9  HGB 14.4  HCT 44.2  PLT 169   ------------------------------------------------------------------------------------------------------------------  Chemistries   Recent Labs Lab 03/25/16 1610 03/26/16 0345  NA 139 138  K 4.1 5.1  CL 102 101  CO2 31 32  GLUCOSE 166* 271*  BUN 11 11  CREATININE 1.29* 1.52*  CALCIUM 8.2* 8.2*  MG 2.1  --    ------------------------------------------------------------------------------------------------------------------  Cardiac Enzymes No results for input(s): TROPONINI in the last 168 hours. ------------------------------------------------------------------------------------------------------------------  RADIOLOGY:  Dg Chest 2 View  Result Date: 03/25/2016 CLINICAL DATA:  Shortness of breath with nonproductive cough EXAM: CHEST  2 VIEW COMPARISON:  11/17/2015 FINDINGS: Stable elevation of left diaphragm with presumed scarring at the left base. Additional areas of bandlike scarring within the bilateral lungs as before. No definite acute infiltrate. Stable cardiomediastinal silhouette. No pneumothorax. Stable round opacity lower right hilar area likely related to pulmonary vasculature. IMPRESSION: Stable areas of pleural and parenchymal scarring. No acute infiltrates. Electronically  Signed   By:  Donavan Foil M.D.   On: 03/25/2016 19:40     ASSESSMENT AND PLAN:    46 y.o. male with a history of COPD, coronary artery disease, diabetes, hypertension, hyperlipidemia, sarcoidosis, obstructive sleep apnea, CVA, CKD now being admitted with:  1. Acute respiratory failure with Acute exacerbation of COPD: Wean IV steroids and continue azithromycin. Continue Nebulizers, O2 therapy and expectorants as needed.  Continue Advair, Spiriva   2. History of hypertension: Continue Norvasc, Lasix, Imdur, Lopressor  3. History of hyperlipidemia: Continue atorvastatin  4. History of coronary artery disease: Continue metoprolol, isosorbide,aspirin and statin therapy  5. H/o Diabetes: Continue sliding scale insulin, ADA diet and current insulin regimen.  Appreciate diabetes coronary consultation.   6. History of OSA: Order Nocturnal BiPAP  7. History of sarcoid: Follows with Dr [name]   Management plans discussed with the patient and he is in agreement.  CODE STATUS: full  TOTAL TIME TAKING CARE OF THIS PATIENT: 30 minutes.     POSSIBLE D/C tomorrow, DEPENDING ON CLINICAL CONDITION.   [name] M.D on 03/26/2016 at 11:34 AM  Between 7am to 6pm - Pager - [phone] After 6pm go to [email or web address] - password [email or web address]  Office  [phone]  CC: Primary care physician; Sabino Snipes KEY, MD  Note: This dictation was prepared with Dragon dictation along with smaller phrase technology. Any transcriptional errors that result from this process are unintentional.

## 2016-03-26 NOTE — Progress Notes (Signed)
Nutrition Brief Note  Patient identified on the Malnutrition Screening Tool (MST) Report  Wt Readings from Last 15 Encounters:  03/26/16 289 lb (131.1 kg)  03/22/16 290 lb 2 oz (131.6 kg)  12/20/15 296 lb (134.3 kg)  11/18/15 277 lb 4.8 oz (125.8 kg)  09/15/15 299 lb (135.6 kg)  06/14/15 291 lb (132 kg)  03/15/15 296 lb (134.3 kg)  03/08/15 300 lb (136.1 kg)  02/21/15 286 lb 14.4 oz (130.1 kg)  02/09/15 275 lb (124.7 kg)  12/28/14 287 lb (130.2 kg)  11/17/14 290 lb (131.5 kg)  10/18/14 291 lb (132 kg)  08/25/14 291 lb 14.4 oz (132.4 kg)  08/14/14 280 lb (127 kg)   Spoke with patient at bedside. He reports his appetite is good now and also was PTA and that he is eating well. He reports he lost 20 lbs over 1.5 months, but that it was intentional weight loss. Denies N/V, constipation/diarrhea, or difficulty chewing or swallowing. Nutrition-Focused physical exam completed. Findings are no fat depletion, no muscle depletion, and no edema. Patient does not have any nutrition-related questions.   Body mass index is 39.2 kg/m. Patient meets criteria for Obesity Class II based on current BMI.   Current diet order is Heart Healthy/Carbohydrate Modified, patient is consuming approximately 100% of meals at this time. Labs and medications reviewed.   No nutrition interventions warranted at this time. If nutrition issues arise, please consult RD.   Willey Blade, MS, RD, LDN Pager: [phone] After Hours Pager: [phone]

## 2016-03-26 NOTE — Progress Notes (Signed)
Inpatient Diabetes Program Recommendations  AACE/ADA: New Consensus Statement on Inpatient Glycemic Control (2015)  Target Ranges:  Prepandial:   less than 140 mg/dL      Peak postprandial:   less than 180 mg/dL (1-2 hours)      Critically ill patients:  140 - 180 mg/dL   Results for [name], [name] (MRN [id number]) as of 03/26/2016 09:02  Ref. Range 03/26/2016 00:49 03/26/2016 07:33  Glucose-Capillary Latest Ref Range: 65 - 99 mg/dL 257 (H) 205 (H)    Review of Glycemic Control  Diabetes history: DM2 Outpatient Diabetes medications: Levemir 40 units QHS, Humalog 8 units TID with meals, Metformin XR 500 mg QAM Current orders for Inpatient glycemic control: Levemir 40 units QHS, Novolog 0-20 units TID with meals, Novolog 0-5 units QHS  Inpatient Diabetes Program Recommendations: Insulin - Meal Coverage: Please consider ordering Novolog 4 units TID with meals for meal coverage if patient eats at least 50% of meals. HgbA1C: Please consider ordering an A1C to evaluate glycemic control over the past 2-3 months.  Thanks, Barnie Alderman, RN, MSN, CDE Diabetes Coordinator Inpatient Diabetes Program [phone] (Team Pager from 8am to 5pm)

## 2016-03-26 NOTE — Progress Notes (Signed)
PT Refusal Note  Patient Details Name: MELVA CISNEROS MRN: [id number] DOB: [birth year]   Cancelled Treatment:    Reason Eval/Treat Not Completed: Pain limiting ability to participate. Chart reviewed and attempted to perform PT evaluation. Pt currently refuses reporting his back is hurting and he did not sleep well last night. He agrees to PT evaluation tomorrow. Will attempt PT evaluation tomorrow as pt is willing.   Lyndel Safe Huprich PT, DPT   Huprich,Jason 03/26/2016, 11:29 AM

## 2016-03-26 NOTE — Progress Notes (Signed)
PHARMACIST - PHYSICIAN COMMUNICATION DR:   [place] CONCERNING: Antibiotic IV to Oral Route Change Policy  RECOMMENDATION: This patient is receiving Azithromycin  by the intravenous route.  Based on criteria approved by the Pharmacy and Therapeutics Committee, the antibiotic(s) is/are being converted to the equivalent oral dose form(s).   DESCRIPTION: These criteria include:  Patient being treated for a respiratory tract infection, urinary tract infection, cellulitis or clostridium difficile associated diarrhea if on metronidazole  The patient is not neutropenic and does not exhibit a GI malabsorption state  The patient is eating (either orally or via tube) and/or has been taking other orally administered medications for a least 24 hours  The patient is improving clinically and has a Tmax < 100.5  If you have questions about this conversion, please contact the Pharmacy Department  []   [phone] )  [facility] [x]   [phone] )  [facility] []   [phone] )  [facility] []   [phone] )  [facility] []   [phone] )  [facility]

## 2016-03-27 LAB — GLUCOSE, CAPILLARY
GLUCOSE-CAPILLARY: 226 mg/dL — AB (ref 65–99)
GLUCOSE-CAPILLARY: 277 mg/dL — AB (ref 65–99)
GLUCOSE-CAPILLARY: 295 mg/dL — AB (ref 65–99)
Glucose-Capillary: 309 mg/dL — ABNORMAL HIGH (ref 65–99)

## 2016-03-27 LAB — BASIC METABOLIC PANEL
ANION GAP: 4 — AB (ref 5–15)
BUN: 18 mg/dL (ref 6–20)
CO2: 33 mmol/L — ABNORMAL HIGH (ref 22–32)
Calcium: 8.2 mg/dL — ABNORMAL LOW (ref 8.9–10.3)
Chloride: 101 mmol/L (ref 101–111)
Creatinine, Ser: 1.46 mg/dL — ABNORMAL HIGH (ref 0.61–1.24)
GFR, EST NON AFRICAN AMERICAN: 56 mL/min — AB (ref >60)
GLUCOSE: 251 mg/dL — AB (ref 65–99)
Potassium: 4.9 mmol/L (ref 3.5–5.1)
SODIUM: 138 mmol/L (ref 135–145)

## 2016-03-27 LAB — HIV ANTIBODY (ROUTINE TESTING W REFLEX): HIV Screen 4th Generation wRfx: NONREACTIVE

## 2016-03-27 LAB — HEMOGLOBIN A1C
Hgb A1c MFr Bld: 6.8 % — ABNORMAL HIGH (ref 4.8–5.6)
MEAN PLASMA GLUCOSE: 148 mg/dL

## 2016-03-27 NOTE — Progress Notes (Signed)
[place] at [place] NAME: [name]    MR#:  [id number]  DATE OF BIRTH:  [birth year]  SUBJECTIVE:  Doing better this am sob still with wheezing   REVIEW OF SYSTEMS:    Review of Systems  Constitutional: Negative.  Negative for chills, fever and malaise/fatigue.  HENT: Negative.  Negative for ear discharge, ear pain, hearing loss, nosebleeds and sore throat.   Eyes: Negative.  Negative for blurred vision and pain.  Respiratory: Positive for shortness of breath and wheezing. Negative for cough and hemoptysis.   Cardiovascular: Negative.  Negative for chest pain, palpitations and leg swelling.  Gastrointestinal: Negative.  Negative for abdominal pain, blood in stool, diarrhea, nausea and vomiting.  Genitourinary: Negative.  Negative for dysuria.  Musculoskeletal: Negative.  Negative for back pain.  Skin: Negative.   Neurological: Negative for dizziness, tremors, speech change, focal weakness, seizures and headaches.  Endo/Heme/Allergies: Negative.  Does not bruise/bleed easily.  Psychiatric/Behavioral: Negative.  Negative for depression, hallucinations and suicidal ideas.    Tolerating Diet: yes      DRUG ALLERGIES:   Allergies  Allergen Reactions  . Penicillins Anaphylaxis and Other (See Comments)    Unable to obtain enough information to answer additional questions about this medication.      VITALS:  Blood pressure 118/75, pulse 84, temperature 97.5 F (36.4 C), temperature source Oral, resp. rate 18, height 6' (1.829 m), weight 131.1 kg (289 lb), SpO2 98 %.  PHYSICAL EXAMINATION:   Physical Exam  Constitutional: He is oriented to person, place, and time and well-developed, well-nourished, and in no distress. No distress.  HENT:  Head: Normocephalic.  Eyes: No scleral icterus.  Neck: Normal range of motion. Neck supple. No JVD present. No tracheal deviation present.  Cardiovascular: Normal rate, regular rhythm and  normal heart sounds.  Exam reveals no gallop and no friction rub.   No murmur heard. Pulmonary/Chest: Effort normal. No respiratory distress. He has wheezes. He has no rales. He exhibits no tenderness.  Abdominal: Soft. Bowel sounds are normal. He exhibits no distension and no mass. There is no tenderness. There is no rebound and no guarding.  Musculoskeletal: Normal range of motion. He exhibits no edema.  Neurological: He is alert and oriented to person, place, and time.  Skin: Skin is warm. No rash noted. No erythema.  Psychiatric: Affect and judgment normal.      LABORATORY PANEL:   CBC  Recent Labs Lab 03/26/16 0345  WBC 4.9  HGB 14.4  HCT 44.2  PLT 169   ------------------------------------------------------------------------------------------------------------------  Chemistries   Recent Labs Lab 03/25/16 1610  03/27/16 0520  NA 139  < > 138  K 4.1  < > 4.9  CL 102  < > 101  CO2 31  < > 33*  GLUCOSE 166*  < > 251*  BUN 11  < > 18  CREATININE 1.29*  < > 1.46*  CALCIUM 8.2*  < > 8.2*  MG 2.1  --   --   < > = values in this interval not displayed. ------------------------------------------------------------------------------------------------------------------  Cardiac Enzymes No results for input(s): TROPONINI in the last 168 hours. ------------------------------------------------------------------------------------------------------------------  RADIOLOGY:  Dg Chest 2 View  Result Date: 03/25/2016 CLINICAL DATA:  Shortness of breath with nonproductive cough EXAM: CHEST  2 VIEW COMPARISON:  11/17/2015 FINDINGS: Stable elevation of left diaphragm with presumed scarring at the left base. Additional areas of bandlike scarring within the bilateral lungs as before. No definite acute infiltrate.  Stable cardiomediastinal silhouette. No pneumothorax. Stable round opacity lower right hilar area likely related to pulmonary vasculature. IMPRESSION: Stable areas of pleural  and parenchymal scarring. No acute infiltrates. Electronically Signed   By: Donavan Foil M.D.   On: 03/25/2016 19:40     ASSESSMENT AND PLAN:    46 y.o. male with a history of COPD, coronary artery disease, diabetes, hypertension, hyperlipidemia, sarcoidosis, obstructive sleep apnea, CVA, CKD now being admitted with:  1. Acute respiratory failure with Acute exacerbation of COPD: Continue current dose of steroids with plan to transition tomorrow to oral steroids and continue azithromycin. Continue Nebulizers, O2 therapy and expectorants as needed.  Continue Advair, Spiriva Patient wears O2 at night and on exertion at home.   2. History of hypertension: Continue Norvasc, Lasix, Imdur, Lopressor  3. History of hyperlipidemia: Continue atorvastatin  4. History of coronary artery disease: Continue metoprolol, isosorbide,aspirin and statin therapy  5. H/o Diabetes: Continue sliding scale insulin, ADA diet and current insulin regimen.  Appreciate diabetes consultation.   6. History of OSA: Continue Nocturnal BiPAP  7. History of sarcoid: Follows with Dr [name]   Management plans discussed with the patient and he is in agreement.  CODE STATUS: full  TOTAL TIME TAKING CARE OF THIS PATIENT: 24 minutes.     POSSIBLE D/C tomorrow, DEPENDING ON CLINICAL CONDITION.   Nain Rudd M.D on 03/27/2016 at 11:02 AM  Between 7am to 6pm - Pager - [phone] After 6pm go to [email or web address] - password [email or web address]  Office  [phone]  CC: Primary care physician; Sabino Snipes KEY, MD  Note: This dictation was prepared with Dragon dictation along with smaller phrase technology. Any transcriptional errors that result from this process are unintentional.

## 2016-03-27 NOTE — Progress Notes (Addendum)
Inpatient Diabetes Program Recommendations  AACE/ADA: New Consensus Statement on Inpatient Glycemic Control (2015)  Target Ranges:  Prepandial:   less than 140 mg/dL      Peak postprandial:   less than 180 mg/dL (1-2 hours)      Critically ill patients:  140 - 180 mg/dL   Results for Corey, [name] (MRN [id number]) as of 03/27/2016 14:29  Ref. Range 03/26/2016 07:33 03/26/2016 11:41 03/26/2016 16:38 03/26/2016 21:10 03/27/2016 07:39 03/27/2016 11:48  Glucose-Capillary Latest Ref Range: 65 - 99 mg/dL 205 (H) 220 (H) 189 (H) 249 (H) 226 (H) 277 (H)   Review of Glycemic Control  Diabetes history: DM2 Outpatient Diabetes medications: Levemir 40 units QHS, Humalog 8 units TID with meals, Metformin XR 500 mg QAM Current orders for Inpatient glycemic control: Levemir 40 units QHS, Novolog 0-20 units TID with meals, Novolog 0-5 units QHS, Novolog 4 units TID with meal for meal coverage  Inpatient Diabetes Program Recommendations: Insulin - Meal Coverage: If steroids are continued as ordered, please consider increasing meal coverage to Novolog 7 units TID with meals if patient eats at least 50% of meals.  Thanks, Barnie Alderman, RN, MSN, CDE Diabetes Coordinator Inpatient Diabetes Program [phone] (Team Pager from 8am to 5pm)

## 2016-03-27 NOTE — Progress Notes (Signed)
Patient was encouraged to bring O2 tank for transport home upon discharge but patient declined. Patient" I will be okay until I get home without O2 nasal cannula." see flowsheet.

## 2016-03-27 NOTE — Evaluation (Signed)
Physical Therapy Evaluation Patient Details Name: RATANA FLOOD MRN: [id number] DOB: July 22, 1970 Today's Date: 03/27/2016   History of Present Illness  46 y.o. male with a known history of COPD, coronary artery disease, diabetes, hypertension, hyperlipidemia, sarcoidosis, obstructive sleep apnea, CVA, CKD was in a usual state of health until 3 days ago when he describes cough occasionally productive, nasal and chest congestion associated with wheezing and shortness of breath.  Clinical Impression  Pt did well with PT exam and showed good safety and confidence with balance, mobility, ambulation and steps.  He does not require further PT intervention - pt safe to go home once medically cleared.    Follow Up Recommendations No PT follow up    Equipment Recommendations       Recommendations for Other Services       Precautions / Restrictions Precautions Precautions: None Restrictions Weight Bearing Restrictions: No      Mobility  Bed Mobility Overal bed mobility: Independent                Transfers Overall transfer level: Independent Equipment used: None             General transfer comment: Pt able to rise with good confidence and balance  Ambulation/Gait Ambulation/Gait assistance: Modified independent (Device/Increase time) Ambulation Distance (Feet): 100 Feet Assistive device: None       General Gait Details: Pt able to walk with good speed and confidence no issues.  He was on room air and his sats stayed 90 and above - pt with minimal fatigue but generally had not issues requiring further PT intervention.   Stairs Stairs: Yes Stairs assistance: Modified independent (Device/Increase time) Stair Management: No rails Number of Stairs: 3 General stair comments: Good confidence and safety with the steps  Wheelchair Mobility    Modified Rankin (Stroke Patients Only)       Balance Overall balance assessment: Independent                                            Pertinent Vitals/Pain Pain Assessment: No/denies pain    Home Living Family/patient expects to be discharged to:: Private residence Living Arrangements: Spouse/significant other Available Help at Discharge: Family Type of Home: House Home Access: Stairs to enter Entrance Stairs-Rails: None Entrance Stairs-Number of Steps: 3          Prior Function Level of Independence: Independent         Comments: Indep with household/community mobility; denies fall history.  Recent use of home O2 at 2L with exertion. BiPap at night     Hand Dominance        Extremity/Trunk Assessment   Upper Extremity Assessment Upper Extremity Assessment: Overall WFL for tasks assessed    Lower Extremity Assessment Lower Extremity Assessment: Overall WFL for tasks assessed       Communication   Communication: No difficulties  Cognition Arousal/Alertness: Awake/alert Behavior During Therapy: WFL for tasks assessed/performed Overall Cognitive Status: Within Functional Limits for tasks assessed                      General Comments      Exercises     Assessment/Plan    PT Assessment Patent does not need any further PT services  PT Problem List         PT Treatment Interventions  PT Goals (Current goals can be found in the Care Plan section)  Acute Rehab PT Goals Patient Stated Goal: go home    Frequency     Barriers to discharge        Co-evaluation               End of Session   Activity Tolerance: Patient tolerated treatment well Patient left: in bed;with call bell/phone within reach Nurse Communication: Mobility status PT Visit Diagnosis: Muscle weakness (generalized) (M62.81)         Time: [id number] PT Time Calculation (min) (ACUTE ONLY): 21 min   Charges:   PT Evaluation $PT Eval Low Complexity: 1 Procedure     PT G CodesKreg Shropshire, DPT 03/27/2016, 12:06 PM

## 2016-03-27 NOTE — Progress Notes (Signed)
Per PT staff, patient was ambulating in the hallway and stairways with sustaining Oxygen saturation  of  91% at Room Air.

## 2016-03-28 LAB — GLUCOSE, CAPILLARY
GLUCOSE-CAPILLARY: 255 mg/dL — AB (ref 65–99)
GLUCOSE-CAPILLARY: 340 mg/dL — AB (ref 65–99)
Glucose-Capillary: 348 mg/dL — ABNORMAL HIGH (ref 65–99)

## 2016-03-28 MED ORDER — AZITHROMYCIN 250 MG PO TABS: ORAL_TABLET | ORAL | 0 refills | Status: DC

## 2016-03-28 MED ORDER — IPRATROPIUM-ALBUTEROL 0.5-2.5 (3) MG/3ML IN SOLN: 3.0000 mL | Freq: Four times a day (QID) | RESPIRATORY_TRACT | Status: DC | PRN

## 2016-03-28 MED ORDER — PREDNISONE 10 MG PO TABS: 10.0000 mg | ORAL_TABLET | Freq: Every day | ORAL | 0 refills | Status: DC

## 2016-03-28 NOTE — Care Management Important Message (Signed)
Important Message  Patient Details  Name: WARSAME HENDERSHOT MRN: [id number] Date of Birth: Aug 28, 1970   Medicare Important Message Given:  Yes    Beverly Sessions, RN 03/28/2016, 10:16 AM

## 2016-03-28 NOTE — Discharge Summary (Signed)
[place] [facility] [place] NAME: [name]    MR#:  [id number]  DATE OF BIRTH:  [birth year]  DATE OF ADMISSION:  03/25/2016 ADMITTING PHYSICIAN: Harvie Bridge, DO  DATE OF DISCHARGE: 03/28/2016  PRIMARY CARE PHYSICIAN: SOLES, MEREDITH KEY, MD    ADMISSION DIAGNOSIS:  COPD exacerbation ([place]) [J44.1] Upper respiratory tract infection, unspecified type [J06.9]  DISCHARGE DIAGNOSIS:  Active Problems:   COPD exacerbation ([place])   SECONDARY DIAGNOSIS:   Past Medical History:  Diagnosis Date  . COPD (chronic obstructive pulmonary disease) ([place])   . Coronary artery disease   . Diabetes mellitus without complication ([place])   . Hypercholesteremia unk  . Hypertension   . Sarcoidosis ([place])   . Sleep apnea    does not use CPAP regularly.  . Stroke [facility])     HOSPITAL COURSE:  46 y.o.malewith a history of COPD, coronary artery disease, diabetes, hypertension, hyperlipidemia, sarcoidosis, obstructive sleep apnea, CVA, CKDnow being admitted with:  1. Acute respiratory failure with Acute exacerbation of COPD: Patient has responded wll to IV steroids and is transitioned to oral steroids at discharge. He will continue azithromycin. Continue Advair, Spiriva Patient wears O2 at night and on exertion at home.   2. History of hypertension: Continue Norvasc, Lasix, Imdur, Lopressor  3. History of hyperlipidemia: Continue atorvastatin  4. History of coronary artery disease: Continue metoprolol, isosorbide,aspirin and statin therapy  5. H/o Diabetes: Continue  ADA diet and current insulin regimen.  Appreciate diabetes consultation.   6. History of OSA: Continue Nocturnal BiPAP  7. History of sarcoid:He will have follow up with Dr [name]     DISCHARGE CONDITIONS AND DIET:   Stable Diabetic diet  CONSULTS OBTAINED:    DRUG ALLERGIES:   Allergies  Allergen Reactions  . Penicillins Anaphylaxis and Other (See Comments)   Unable to obtain enough information to answer additional questions about this medication.      DISCHARGE MEDICATIONS:   Current Discharge Medication List    START taking these medications   Details  azithromycin (ZITHROMAX) 250 MG tablet Take 1 tablet daily for 5 days Qty: 5 each, Refills: 0    predniSONE (DELTASONE) 10 MG tablet Take 1 tablet (10 mg total) by mouth daily with breakfast. 60 mg PO (ORAL) x 2 days 50 mg PO (ORAL)  x 2 days 40 mg PO (ORAL)  x 2 days 30 mg PO  (ORAL)  x 2 days 20 mg PO  (ORAL) x 2 days 10 mg PO  (ORAL) x 2 days then stop Qty: 42 tablet, Refills: 0      CONTINUE these medications which have NOT CHANGED   Details  albuterol (PROVENTIL) (2.5 MG/3ML) 0.083% nebulizer solution Take 2.5 mg by nebulization every 6 (six) hours as needed for wheezing or shortness of breath.    amLODipine (NORVASC) 5 MG tablet Take 5 mg by mouth daily.    aspirin 81 MG chewable tablet Chew 81 mg by mouth daily.    atorvastatin (LIPITOR) 40 MG tablet Take 40 mg by mouth at bedtime.     Fluticasone-Salmeterol (ADVAIR) 250-50 MCG/DOSE AEPB Inhale 1 puff into the lungs 2 (two) times daily.    furosemide (LASIX) 80 MG tablet Take 80 mg by mouth daily.    insulin detemir (LEVEMIR) 100 UNIT/ML injection Inject 40 Units into the skin at bedtime.     insulin lispro (HUMALOG) 100 UNIT/ML injection Inject 8 Units into the skin 3 (three) times daily with meals. Sliding  scale as needed    ipratropium-albuterol (DUONEB) 0.5-2.5 (3) MG/3ML SOLN Take 3 mLs by nebulization 4 (four) times daily. Qty: 360 mL, Refills: 6    isosorbide dinitrate (ISORDIL) 10 MG tablet Take 1 tablet (10 mg total) by mouth 3 (three) times daily. Qty: 90 tablet, Refills: 5    lisinopril (PRINIVIL,ZESTRIL) 10 MG tablet Take 10 mg by mouth daily.    metFORMIN (GLUCOPHAGE-XR) 500 MG 24 hr tablet Take 500 mg by mouth daily with breakfast.     metoprolol (LOPRESSOR) 100 MG tablet Take 50 mg by mouth 2 (two)  times daily.     potassium chloride SA (K-DUR,KLOR-CON) 20 MEQ tablet Take 1 tablet (20 mEq total) by mouth 2 (two) times daily. Qty: 60 tablet, Refills: 5    tiotropium (SPIRIVA) 18 MCG inhalation capsule Place 18 mcg into inhaler and inhale daily.          Today   CHIEF COMPLAINT:  Doing well this am wheezing improved   VITAL SIGNS:  Blood pressure 130/81, pulse 87, temperature 98 F (36.7 C), temperature source Oral, resp. rate 18, height 6' (1.829 m), weight 131.1 kg (289 lb), SpO2 95 %.   REVIEW OF SYSTEMS:  Review of Systems  Constitutional: Negative.  Negative for chills, fever and malaise/fatigue.  HENT: Negative.  Negative for ear discharge, ear pain, hearing loss, nosebleeds and sore throat.   Eyes: Negative.  Negative for blurred vision and pain.  Respiratory: Negative.  Negative for cough, hemoptysis, shortness of breath and wheezing.   Cardiovascular: Negative.  Negative for chest pain, palpitations and leg swelling.  Gastrointestinal: Negative.  Negative for abdominal pain, blood in stool, diarrhea, nausea and vomiting.  Genitourinary: Negative.  Negative for dysuria.  Musculoskeletal: Negative.  Negative for back pain.  Skin: Negative.   Neurological: Negative for dizziness, tremors, speech change, focal weakness, seizures and headaches.  Endo/Heme/Allergies: Negative.  Does not bruise/bleed easily.  Psychiatric/Behavioral: Negative.  Negative for depression, hallucinations and suicidal ideas.     PHYSICAL EXAMINATION:  GENERAL:  46 y.o.-year-old patient lying in the bed with no acute distress.  NECK:  Supple, no jugular venous distention. No thyroid enlargement, no tenderness.  LUNGS: Normal breath sounds bilaterally, no wheezing, rales,rhonchi  No use of accessory muscles of respiration.  CARDIOVASCULAR: S1, S2 normal. No murmurs, rubs, or gallops.  ABDOMEN: Soft, non-tender, non-distended. Bowel sounds present. No organomegaly or mass.  EXTREMITIES: No  pedal edema, cyanosis, or clubbing.  PSYCHIATRIC: The patient is alert and oriented x 3.  SKIN: No obvious rash, lesion, or ulcer.   DATA REVIEW:   CBC  Recent Labs Lab 03/26/16 0345  WBC 4.9  HGB 14.4  HCT 44.2  PLT 169    Chemistries   Recent Labs Lab 03/25/16 1610  03/27/16 0520  NA 139  < > 138  K 4.1  < > 4.9  CL 102  < > 101  CO2 31  < > 33*  GLUCOSE 166*  < > 251*  BUN 11  < > 18  CREATININE 1.29*  < > 1.46*  CALCIUM 8.2*  < > 8.2*  MG 2.1  --   --   < > = values in this interval not displayed.  Cardiac Enzymes No results for input(s): TROPONINI in the last 168 hours.  Microbiology Results  @MICRORSLT48 @  RADIOLOGY:  No results found.    Current Discharge Medication List    START taking these medications   Details  azithromycin (ZITHROMAX) 250 MG tablet  Take 1 tablet daily for 5 days Qty: 5 each, Refills: 0    predniSONE (DELTASONE) 10 MG tablet Take 1 tablet (10 mg total) by mouth daily with breakfast. 60 mg PO (ORAL) x 2 days 50 mg PO (ORAL)  x 2 days 40 mg PO (ORAL)  x 2 days 30 mg PO  (ORAL)  x 2 days 20 mg PO  (ORAL) x 2 days 10 mg PO  (ORAL) x 2 days then stop Qty: 42 tablet, Refills: 0      CONTINUE these medications which have NOT CHANGED   Details  albuterol (PROVENTIL) (2.5 MG/3ML) 0.083% nebulizer solution Take 2.5 mg by nebulization every 6 (six) hours as needed for wheezing or shortness of breath.    amLODipine (NORVASC) 5 MG tablet Take 5 mg by mouth daily.    aspirin 81 MG chewable tablet Chew 81 mg by mouth daily.    atorvastatin (LIPITOR) 40 MG tablet Take 40 mg by mouth at bedtime.     Fluticasone-Salmeterol (ADVAIR) 250-50 MCG/DOSE AEPB Inhale 1 puff into the lungs 2 (two) times daily.    furosemide (LASIX) 80 MG tablet Take 80 mg by mouth daily.    insulin detemir (LEVEMIR) 100 UNIT/ML injection Inject 40 Units into the skin at bedtime.     insulin lispro (HUMALOG) 100 UNIT/ML injection Inject 8 Units into the  skin 3 (three) times daily with meals. Sliding scale as needed    ipratropium-albuterol (DUONEB) 0.5-2.5 (3) MG/3ML SOLN Take 3 mLs by nebulization 4 (four) times daily. Qty: 360 mL, Refills: 6    isosorbide dinitrate (ISORDIL) 10 MG tablet Take 1 tablet (10 mg total) by mouth 3 (three) times daily. Qty: 90 tablet, Refills: 5    lisinopril (PRINIVIL,ZESTRIL) 10 MG tablet Take 10 mg by mouth daily.    metFORMIN (GLUCOPHAGE-XR) 500 MG 24 hr tablet Take 500 mg by mouth daily with breakfast.     metoprolol (LOPRESSOR) 100 MG tablet Take 50 mg by mouth 2 (two) times daily.     potassium chloride SA (K-DUR,KLOR-CON) 20 MEQ tablet Take 1 tablet (20 mEq total) by mouth 2 (two) times daily. Qty: 60 tablet, Refills: 5    tiotropium (SPIRIVA) 18 MCG inhalation capsule Place 18 mcg into inhaler and inhale daily.          Management plans discussed with the patient and he is in agreement. Stable for discharge home  Patient should follow up with pcp  CODE STATUS:     Code Status Orders        Start     Ordered   03/26/16 0044  Full code  Continuous     03/26/16 0043    Code Status History    Date Active Date Inactive Code Status Order ID Comments User Context   11/17/2015 11:31 AM 11/18/2015  6:45 PM Full Code [id number]  Bettey Costa, MD Inpatient   02/11/2015  2:17 PM 02/21/2015  6:41 PM Full Code [id number]  Henreitta Leber, MD Inpatient   12/28/2014  9:34 PM 12/30/2014  4:02 PM Full Code [id number]  Fritzi Mandes, MD Inpatient   08/24/2014  2:45 AM 08/26/2014  6:19 PM Full Code [id number]  Lytle Butte, MD ED   08/14/2014 10:31 AM 08/18/2014  6:30 PM Full Code [id number]  Vaughan Basta, MD Inpatient    Advance Directive Documentation   Flowsheet Row Most Recent Value  Type of Advance Directive  Healthcare Power of Attorney  Pre-existing out of facility DNR order (yellow  form or pink MOST form)  No data  "MOST" Form in Place?  No data      TOTAL TIME TAKING CARE OF THIS PATIENT: 37  minutes.    Note: This dictation was prepared with Dragon dictation along with smaller phrase technology. Any transcriptional errors that result from this process are unintentional.  Marcha Licklider M.D on 03/28/2016 at 7:56 AM  Between 7am to 6pm - Pager - [phone] After 6pm go to [email or web address] - password [email or web address]  [facility]  Office  [phone]  CC: Primary care physician; Sabino Snipes KEY, MD

## 2016-03-28 NOTE — Progress Notes (Signed)
03/28/2016 4:55 PM  BP 125/77 (BP Location: Left Arm)   Pulse 84   Temp 98 F (36.7 C) (Oral)   Resp 20   Ht 6' (1.829 m)   Wt 131.1 kg (289 lb)   SpO2 96%   BMI 39.20 kg/m  Patient discharged per MD orders. Discharge instructions reviewed with patient and patient verbalized understanding. IV removed per policy. Prescriptions discussed and given to patient. Discharged via wheelchair escorted by nursing staff.  Almedia Balls, RN

## 2016-05-09 ENCOUNTER — Other Ambulatory Visit: Payer: Self-pay | Admitting: Family

## 2016-07-01 ENCOUNTER — Encounter: Payer: Self-pay | Admitting: Emergency Medicine

## 2016-07-01 ENCOUNTER — Inpatient Hospital Stay
Admission: EM | Admit: 2016-07-01 | Discharge: 2016-07-03 | DRG: 190 | Disposition: A | Discharge status: Home / Self Care | Payer: Medicare Other | Attending: Internal Medicine | Admitting: Internal Medicine

## 2016-07-01 ENCOUNTER — Emergency Department: Payer: Medicare Other

## 2016-07-01 DIAGNOSIS — Z7901 Long term (current) use of anticoagulants: Secondary | ICD-10-CM | POA: Diagnosis not present

## 2016-07-01 DIAGNOSIS — I5042 Chronic combined systolic (congestive) and diastolic (congestive) heart failure: Secondary | ICD-10-CM | POA: Diagnosis present

## 2016-07-01 DIAGNOSIS — Z880 Allergy status to penicillin: Secondary | ICD-10-CM

## 2016-07-01 DIAGNOSIS — E119 Type 2 diabetes mellitus without complications: Secondary | ICD-10-CM | POA: Diagnosis present

## 2016-07-01 DIAGNOSIS — I110 Hypertensive heart disease with heart failure: Secondary | ICD-10-CM | POA: Diagnosis present

## 2016-07-01 DIAGNOSIS — G4733 Obstructive sleep apnea (adult) (pediatric): Secondary | ICD-10-CM | POA: Diagnosis present

## 2016-07-01 DIAGNOSIS — Z833 Family history of diabetes mellitus: Secondary | ICD-10-CM

## 2016-07-01 DIAGNOSIS — Z7951 Long term (current) use of inhaled steroids: Secondary | ICD-10-CM

## 2016-07-01 DIAGNOSIS — I480 Paroxysmal atrial fibrillation: Secondary | ICD-10-CM | POA: Diagnosis present

## 2016-07-01 DIAGNOSIS — E7800 Pure hypercholesterolemia, unspecified: Secondary | ICD-10-CM | POA: Diagnosis present

## 2016-07-01 DIAGNOSIS — R0902 Hypoxemia: Secondary | ICD-10-CM

## 2016-07-01 DIAGNOSIS — Z9981 Dependence on supplemental oxygen: Secondary | ICD-10-CM | POA: Diagnosis not present

## 2016-07-01 DIAGNOSIS — Z8249 Family history of ischemic heart disease and other diseases of the circulatory system: Secondary | ICD-10-CM | POA: Diagnosis not present

## 2016-07-01 DIAGNOSIS — I2510 Atherosclerotic heart disease of native coronary artery without angina pectoris: Secondary | ICD-10-CM | POA: Diagnosis present

## 2016-07-01 DIAGNOSIS — E785 Hyperlipidemia, unspecified: Secondary | ICD-10-CM | POA: Diagnosis present

## 2016-07-01 DIAGNOSIS — J441 Chronic obstructive pulmonary disease with (acute) exacerbation: Principal | ICD-10-CM | POA: Diagnosis present

## 2016-07-01 DIAGNOSIS — F17210 Nicotine dependence, cigarettes, uncomplicated: Secondary | ICD-10-CM | POA: Diagnosis present

## 2016-07-01 DIAGNOSIS — Z79899 Other long term (current) drug therapy: Secondary | ICD-10-CM

## 2016-07-01 DIAGNOSIS — R0602 Shortness of breath: Secondary | ICD-10-CM

## 2016-07-01 DIAGNOSIS — Z8673 Personal history of transient ischemic attack (TIA), and cerebral infarction without residual deficits: Secondary | ICD-10-CM | POA: Diagnosis not present

## 2016-07-01 DIAGNOSIS — J9601 Acute respiratory failure with hypoxia: Secondary | ICD-10-CM | POA: Diagnosis present

## 2016-07-01 DIAGNOSIS — Z794 Long term (current) use of insulin: Secondary | ICD-10-CM

## 2016-07-01 DIAGNOSIS — D869 Sarcoidosis, unspecified: Secondary | ICD-10-CM | POA: Diagnosis present

## 2016-07-01 DIAGNOSIS — Z7982 Long term (current) use of aspirin: Secondary | ICD-10-CM

## 2016-07-01 HISTORY — DX: Heart failure, unspecified: I50.9

## 2016-07-01 LAB — CBC WITH DIFFERENTIAL/PLATELET
BASOS ABS: 0 10*3/uL (ref 0–0.1)
BASOS PCT: 0 %
EOS ABS: 0 10*3/uL (ref 0–0.7)
Eosinophils Relative: 0 %
HEMATOCRIT: 44.6 % (ref 40.0–52.0)
Hemoglobin: 14.9 g/dL (ref 13.0–18.0)
Lymphocytes Relative: 13 %
Lymphs Abs: 0.9 10*3/uL — ABNORMAL LOW (ref 1.0–3.6)
MCH: 27.8 pg (ref 26.0–34.0)
MCHC: 33.5 g/dL (ref 32.0–36.0)
MCV: 83 fL (ref 80.0–100.0)
MONO ABS: 1.7 10*3/uL — AB (ref 0.2–1.0)
Monocytes Relative: 24 %
NEUTROS ABS: 4.3 10*3/uL (ref 1.4–6.5)
Neutrophils Relative %: 63 %
PLATELETS: 191 10*3/uL (ref 150–440)
RBC: 5.37 MIL/uL (ref 4.40–5.90)
RDW: 14.4 % (ref 11.5–14.5)
WBC: 6.9 10*3/uL (ref 3.8–10.6)

## 2016-07-01 LAB — PROTIME-INR
INR: 1.06
PROTHROMBIN TIME: 13.8 s (ref 11.4–15.2)

## 2016-07-01 LAB — COMPREHENSIVE METABOLIC PANEL
ALT: 20 U/L (ref 17–63)
ANION GAP: 8 (ref 5–15)
AST: 26 U/L (ref 15–41)
Albumin: 3.9 g/dL (ref 3.5–5.0)
Alkaline Phosphatase: 69 U/L (ref 38–126)
BILIRUBIN TOTAL: 1.1 mg/dL (ref 0.3–1.2)
BUN: 11 mg/dL (ref 6–20)
CO2: 31 mmol/L (ref 22–32)
Calcium: 8.5 mg/dL — ABNORMAL LOW (ref 8.9–10.3)
Chloride: 100 mmol/L — ABNORMAL LOW (ref 101–111)
Creatinine, Ser: 1.33 mg/dL — ABNORMAL HIGH (ref 0.61–1.24)
GFR calc Af Amer: >60 mL/min (ref >60)
Glucose, Bld: 182 mg/dL — ABNORMAL HIGH (ref 65–99)
POTASSIUM: 3.7 mmol/L (ref 3.5–5.1)
Sodium: 139 mmol/L (ref 135–145)
TOTAL PROTEIN: 7.3 g/dL (ref 6.5–8.1)

## 2016-07-01 LAB — URINALYSIS, COMPLETE (UACMP) WITH MICROSCOPIC
Bacteria, UA: NONE SEEN
Bilirubin Urine: NEGATIVE
GLUCOSE, UA: 50 mg/dL — AB
Hgb urine dipstick: NEGATIVE
Ketones, ur: NEGATIVE mg/dL
Leukocytes, UA: NEGATIVE
Nitrite: NEGATIVE
PH: 6 (ref 5.0–8.0)
Protein, ur: 100 mg/dL — AB
SPECIFIC GRAVITY, URINE: 1.026 (ref 1.005–1.030)
Squamous Epithelial / LPF: NONE SEEN

## 2016-07-01 LAB — GLUCOSE, CAPILLARY
GLUCOSE-CAPILLARY: 142 mg/dL — AB (ref 65–99)
GLUCOSE-CAPILLARY: 194 mg/dL — AB (ref 65–99)

## 2016-07-01 LAB — LACTIC ACID, PLASMA: LACTIC ACID, VENOUS: 1.4 mmol/L (ref 0.5–1.9)

## 2016-07-01 LAB — MRSA PCR SCREENING: MRSA by PCR: NEGATIVE

## 2016-07-01 LAB — INFLUENZA PANEL BY PCR (TYPE A & B)
INFLBPCR: NEGATIVE
Influenza A By PCR: NEGATIVE

## 2016-07-01 LAB — TROPONIN I: Troponin I: <0.03 ng/mL (ref <0.03)

## 2016-07-01 MED ORDER — INSULIN ASPART 100 UNIT/ML ~~LOC~~ SOLN
0.0000 [IU] | Freq: Three times a day (TID) | SUBCUTANEOUS | Status: DC
  Administered 2016-07-01 – 2016-07-02 (×2): 3 [IU] via SUBCUTANEOUS
  Administered 2016-07-02 (×2): 4 [IU] via SUBCUTANEOUS
  Administered 2016-07-03 (×2): 3 [IU] via SUBCUTANEOUS
  Filled 2016-07-01: qty 4
  Filled 2016-07-01 (×4): qty 3
  Filled 2016-07-01: qty 4

## 2016-07-01 MED ORDER — VANCOMYCIN HCL IN DEXTROSE 1-5 GM/200ML-% IV SOLN
1000.0000 mg | Freq: Once | INTRAVENOUS | Status: AC
  Administered 2016-07-01: 1000 mg via INTRAVENOUS
  Filled 2016-07-01: qty 200

## 2016-07-01 MED ORDER — METHYLPREDNISOLONE SODIUM SUCC 40 MG IJ SOLR
40.0000 mg | Freq: Four times a day (QID) | INTRAMUSCULAR | Status: DC
  Administered 2016-07-01 – 2016-07-02 (×3): 40 mg via INTRAVENOUS
  Filled 2016-07-01 (×3): qty 1

## 2016-07-01 MED ORDER — ACETAMINOPHEN 650 MG RE SUPP: 650.0000 mg | Freq: Four times a day (QID) | RECTAL | Status: DC | PRN

## 2016-07-01 MED ORDER — THEOPHYLLINE ER 300 MG PO TB12
300.0000 mg | ORAL_TABLET | Freq: Every day | ORAL | Status: DC
  Administered 2016-07-01 – 2016-07-03 (×3): 300 mg via ORAL
  Filled 2016-07-01 (×5): qty 1

## 2016-07-01 MED ORDER — LEVOFLOXACIN IN D5W 750 MG/150ML IV SOLN
750.0000 mg | Freq: Once | INTRAVENOUS | Status: AC
  Administered 2016-07-01: 750 mg via INTRAVENOUS
  Filled 2016-07-01: qty 150

## 2016-07-01 MED ORDER — METOPROLOL TARTRATE 50 MG PO TABS
50.0000 mg | ORAL_TABLET | Freq: Two times a day (BID) | ORAL | Status: DC
  Administered 2016-07-01 – 2016-07-03 (×3): 50 mg via ORAL
  Filled 2016-07-01 (×5): qty 1

## 2016-07-01 MED ORDER — ORAL CARE MOUTH RINSE
15.0000 mL | Freq: Two times a day (BID) | OROMUCOSAL | Status: DC
  Administered 2016-07-01: 15 mL via OROMUCOSAL

## 2016-07-01 MED ORDER — LEVOFLOXACIN IN D5W 750 MG/150ML IV SOLN
750.0000 mg | INTRAVENOUS | Status: DC
  Filled 2016-07-01: qty 150

## 2016-07-01 MED ORDER — AMLODIPINE BESYLATE 5 MG PO TABS
5.0000 mg | ORAL_TABLET | Freq: Every day | ORAL | Status: DC
  Administered 2016-07-01 – 2016-07-03 (×3): 5 mg via ORAL
  Filled 2016-07-01 (×3): qty 1

## 2016-07-01 MED ORDER — IPRATROPIUM-ALBUTEROL 0.5-2.5 (3) MG/3ML IN SOLN
3.0000 mL | Freq: Four times a day (QID) | RESPIRATORY_TRACT | Status: DC
  Administered 2016-07-01 – 2016-07-03 (×8): 3 mL via RESPIRATORY_TRACT
  Filled 2016-07-01 (×8): qty 3

## 2016-07-01 MED ORDER — ONDANSETRON HCL 4 MG/2ML IJ SOLN
4.0000 mg | Freq: Once | INTRAMUSCULAR | Status: AC
  Administered 2016-07-01: 4 mg via INTRAVENOUS

## 2016-07-01 MED ORDER — ACETAMINOPHEN 325 MG PO TABS
650.0000 mg | ORAL_TABLET | Freq: Four times a day (QID) | ORAL | Status: DC | PRN
  Administered 2016-07-03: 650 mg via ORAL
  Filled 2016-07-01: qty 2

## 2016-07-01 MED ORDER — VANCOMYCIN HCL IN DEXTROSE 1-5 GM/200ML-% IV SOLN: 1000.0000 mg | Freq: Three times a day (TID) | INTRAVENOUS | Status: DC

## 2016-07-01 MED ORDER — FUROSEMIDE 40 MG PO TABS
80.0000 mg | ORAL_TABLET | Freq: Every day | ORAL | Status: DC
  Administered 2016-07-01 – 2016-07-03 (×3): 80 mg via ORAL
  Filled 2016-07-01 (×3): qty 2

## 2016-07-01 MED ORDER — INSULIN DETEMIR 100 UNIT/ML ~~LOC~~ SOLN
40.0000 [IU] | Freq: Every day | SUBCUTANEOUS | Status: DC
  Administered 2016-07-01 – 2016-07-02 (×2): 40 [IU] via SUBCUTANEOUS
  Filled 2016-07-01 (×3): qty 0.4

## 2016-07-01 MED ORDER — ONDANSETRON HCL 4 MG PO TABS
4.0000 mg | ORAL_TABLET | Freq: Four times a day (QID) | ORAL | Status: DC | PRN
Start: 2016-07-01 — End: 2016-07-03

## 2016-07-01 MED ORDER — IOPAMIDOL (ISOVUE-370) INJECTION 76%
75.0000 mL | Freq: Once | INTRAVENOUS | Status: AC | PRN
  Administered 2016-07-01: 75 mL via INTRAVENOUS

## 2016-07-01 MED ORDER — ASPIRIN 81 MG PO CHEW
81.0000 mg | CHEWABLE_TABLET | Freq: Every day | ORAL | Status: DC
  Administered 2016-07-01 – 2016-07-03 (×3): 81 mg via ORAL
  Filled 2016-07-01 (×3): qty 1

## 2016-07-01 MED ORDER — LISINOPRIL 10 MG PO TABS
10.0000 mg | ORAL_TABLET | Freq: Every day | ORAL | Status: DC
  Administered 2016-07-01 – 2016-07-03 (×3): 10 mg via ORAL
  Filled 2016-07-01 (×3): qty 1

## 2016-07-01 MED ORDER — ATORVASTATIN CALCIUM 20 MG PO TABS
40.0000 mg | ORAL_TABLET | Freq: Every day | ORAL | Status: DC
  Administered 2016-07-01 – 2016-07-02 (×2): 40 mg via ORAL
  Filled 2016-07-01 (×2): qty 2

## 2016-07-01 MED ORDER — ONDANSETRON HCL 4 MG/2ML IJ SOLN
INTRAMUSCULAR | Status: AC
  Filled 2016-07-01: qty 2

## 2016-07-01 MED ORDER — APIXABAN 5 MG PO TABS
5.0000 mg | ORAL_TABLET | Freq: Two times a day (BID) | ORAL | Status: DC
  Administered 2016-07-01 – 2016-07-03 (×4): 5 mg via ORAL
  Filled 2016-07-01 (×4): qty 1

## 2016-07-01 MED ORDER — METOPROLOL TARTRATE 100 MG PO TABS: 100.0000 mg | ORAL_TABLET | Freq: Every day | ORAL | Status: DC

## 2016-07-01 MED ORDER — ISOSORBIDE DINITRATE 10 MG PO TABS
10.0000 mg | ORAL_TABLET | Freq: Three times a day (TID) | ORAL | Status: DC
  Administered 2016-07-01 – 2016-07-03 (×5): 10 mg via ORAL
  Filled 2016-07-01 (×6): qty 1

## 2016-07-01 MED ORDER — BUDESONIDE 0.5 MG/2ML IN SUSP
0.5000 mg | Freq: Two times a day (BID) | RESPIRATORY_TRACT | Status: DC
  Administered 2016-07-01 – 2016-07-03 (×4): 0.5 mg via RESPIRATORY_TRACT
  Filled 2016-07-01 (×4): qty 2

## 2016-07-01 MED ORDER — INSULIN ASPART 100 UNIT/ML ~~LOC~~ SOLN
12.0000 [IU] | Freq: Three times a day (TID) | SUBCUTANEOUS | Status: DC
  Administered 2016-07-02 – 2016-07-03 (×5): 12 [IU] via SUBCUTANEOUS
  Filled 2016-07-01 (×5): qty 12

## 2016-07-01 MED ORDER — POTASSIUM CHLORIDE CRYS ER 20 MEQ PO TBCR
20.0000 meq | EXTENDED_RELEASE_TABLET | Freq: Every day | ORAL | Status: DC
  Administered 2016-07-01: 20 meq via ORAL
  Filled 2016-07-01: qty 1

## 2016-07-01 MED ORDER — INSULIN ASPART 100 UNIT/ML ~~LOC~~ SOLN: 0.0000 [IU] | Freq: Every day | SUBCUTANEOUS | Status: DC

## 2016-07-01 MED ORDER — ONDANSETRON HCL 4 MG/2ML IJ SOLN: 4.0000 mg | Freq: Four times a day (QID) | INTRAMUSCULAR | Status: DC | PRN

## 2016-07-01 MED ORDER — LINEZOLID 600 MG/300ML IV SOLN
600.0000 mg | Freq: Two times a day (BID) | INTRAVENOUS | Status: DC
  Administered 2016-07-01 (×2): 600 mg via INTRAVENOUS
  Filled 2016-07-01 (×4): qty 300

## 2016-07-01 NOTE — H&P (Signed)
[place] [facility] [place] NAME: [name]    MR#:  [id number]  DATE OF BIRTH:  03/02/1970  DATE OF ADMISSION:  07/01/2016  PRIMARY CARE PHYSICIAN: Herminio Commons, MD   REQUESTING/REFERRING PHYSICIAN: Dr. Nance Pear  CHIEF COMPLAINT:   Chief Complaint  Patient presents with  . Shortness of Breath    HISTORY OF PRESENT ILLNESS:  [name]  is a 46 y.o. male with a known history of Sarcoidosis, CHF, COPD with ongoing tobacco abuse, history of coronary artery disease, diabetes, hypertension, hyperlipidemia, obstructive sleep apnea presents to the hospital due to shortness of breath cough. Patient says he developed worsening shortness of breath and cough late last night and early this morning. He doesn't use his nebulizer treatments at home along with his home oxygen at bedtime but it did not alleviate his symptoms. He admits to cough which is productive with clear sputum. He presented to the ER have low-grade fever 99.6 and was also noted to be tachycardic, tachypneic. He was noted to be in acute respiratory failure with hypoxia secondary to COPD exacerbation and hospitalist services were contacted further treatment and evaluation. She admits some chest tightness with his shortness of breath but no acute chest pain, no nausea, vomiting, abdominal pain, melena, hematochezia or hematuria or any other associated symptoms presently.  PAST MEDICAL HISTORY:   Past Medical History:  Diagnosis Date  . CHF (congestive heart failure) ([place])   . COPD (chronic obstructive pulmonary disease) ([place])   . Coronary artery disease   . Diabetes mellitus without complication ([place])   . Hypercholesteremia unk  . Hypertension   . Sarcoidosis   . Sleep apnea    does not use CPAP regularly.  . Stroke [facility])     PAST SURGICAL HISTORY:   Past Surgical History:  Procedure Laterality Date  . APPENDECTOMY      SOCIAL HISTORY:   Social History   Substance Use Topics  . Smoking status: Current Every Day Smoker    Packs/day: 0.50    Years: 25.00    Types: Cigarettes    Last attempt to quit: 02/05/2015  . Smokeless tobacco: Never Used  . Alcohol use 0.0 oz/week     Comment: occassional    FAMILY HISTORY:   Family History  Problem Relation Age of Onset  . Hypertension Mother   . Heart disease Mother   . Diabetes Mother   . Cancer Father     DRUG ALLERGIES:   Allergies  Allergen Reactions  . Penicillins Anaphylaxis and Other (See Comments)    Has patient had a PCN reaction causing immediate rash, facial/tongue/throat swelling, SOB or lightheadedness with hypotension: Yes Has patient had a PCN reaction causing severe rash involving mucus membranes or skin necrosis: No Has patient had a PCN reaction that required hospitalization: No Has patient had a PCN reaction occurring within the last 10 years: No If all of the above answers are "NO", then may proceed with Cephalosporin use.     REVIEW OF SYSTEMS:   Review of Systems  Constitutional: Negative for fever and weight loss.  HENT: Negative for congestion, nosebleeds and tinnitus.   Eyes: Negative for blurred vision, double vision and redness.  Respiratory: Positive for cough, sputum production and shortness of breath. Negative for hemoptysis.   Cardiovascular: Negative for chest pain, orthopnea, leg swelling and PND.  Gastrointestinal: Negative for abdominal pain, diarrhea, melena, nausea and vomiting.  Genitourinary: Negative for dysuria, hematuria and urgency.  Musculoskeletal: Negative for falls and joint pain.  Neurological: Negative for dizziness, tingling, sensory change, focal weakness, seizures, weakness and headaches.  Endo/Heme/Allergies: Negative for polydipsia. Does not bruise/bleed easily.  Psychiatric/Behavioral: Negative for depression and memory loss. The patient is not nervous/anxious.     MEDICATIONS AT HOME:   Prior to Admission medications    Medication Sig Start Date End Date Taking? Authorizing Provider  albuterol (PROVENTIL) (2.5 MG/3ML) 0.083% nebulizer solution Take 2.5 mg by nebulization every 6 (six) hours as needed for wheezing or shortness of breath.   Yes [provider]  amLODipine (NORVASC) 5 MG tablet Take 5 mg by mouth daily. 03/12/16  Yes [provider]  apixaban (ELIQUIS) 5 MG TABS tablet Take 5 mg by mouth 2 (two) times daily. 06/11/16  Yes [provider]  aspirin 81 MG chewable tablet Chew 81 mg by mouth daily.   Yes [provider]  atorvastatin (LIPITOR) 40 MG tablet Take 40 mg by mouth at bedtime.    Yes [provider]  Fluticasone-Salmeterol (ADVAIR) 250-50 MCG/DOSE AEPB Inhale 1 puff into the lungs 2 (two) times daily.   Yes [provider]  furosemide (LASIX) 80 MG tablet Take 1 tablet (80 mg total) by mouth daily. 05/10/16  Yes Hackney, Otila Kluver A, FNP  insulin detemir (LEVEMIR) 100 UNIT/ML injection Inject 40-50 Units into the skin at bedtime.    Yes [provider]  insulin lispro (HUMALOG) 100 UNIT/ML injection Inject 12-15 Units into the skin 3 (three) times daily with meals. Sliding scale as needed   Yes [provider]  ipratropium-albuterol (DUONEB) 0.5-2.5 (3) MG/3ML SOLN Take 3 mLs by nebulization 4 (four) times daily. 02/21/15  Yes Theodoro Grist, MD  isosorbide dinitrate (ISORDIL) 10 MG tablet Take 1 tablet (10 mg total) by mouth 3 (three) times daily. 04/01/15  Yes Hackney, Otila Kluver A, FNP  lisinopril (PRINIVIL,ZESTRIL) 10 MG tablet Take 10 mg by mouth daily.   Yes [provider]  metFORMIN (GLUCOPHAGE-XR) 500 MG 24 hr tablet Take 500 mg by mouth daily with breakfast.    Yes [provider]  metoprolol (LOPRESSOR) 100 MG tablet Take 100 mg by mouth daily.    Yes [provider]  potassium chloride SA (K-DUR,KLOR-CON) 20 MEQ tablet Take 1 tablet (20 mEq total) by mouth 2 (two) times daily. Patient taking  differently: Take 20 mEq by mouth daily.  04/01/15  Yes Hackney, Tina A, FNP  theophylline (THEODUR) 300 MG 12 hr tablet Take 300 mg by mouth daily. 04/05/16  Yes [provider]  tiotropium (SPIRIVA) 18 MCG inhalation capsule Place 18 mcg into inhaler and inhale daily.   Yes [provider]      VITAL SIGNS:  Blood pressure 132/69, pulse 100, temperature 98.5 F (36.9 C), temperature source Oral, resp. rate (!) 31, height 6' (1.829 m), weight 127 kg (280 lb), SpO2 96 %.  PHYSICAL EXAMINATION:  Physical Exam  GENERAL:  46 y.o.-year-old patient lying in bed with in mild Resp. distress.  EYES: Pupils equal, round, reactive to light and accommodation. No scleral icterus. Extraocular muscles intact.  HEENT: Head atraumatic, normocephalic. Oropharynx and nasopharynx clear. No oropharyngeal erythema, moist oral mucosa  NECK:  Supple, no jugular venous distention. No thyroid enlargement, no tenderness.  LUNGS: Prolonged inspiratory and expiratory phase, and expiratory wheezing and rhonchi bilaterally. Negative use of accessory muscles. CARDIOVASCULAR: S1, S2 RRR. No murmurs, rubs, gallops, clicks.  ABDOMEN: Soft, nontender, nondistended. Bowel sounds present. No organomegaly or mass.  EXTREMITIES: No pedal edema, cyanosis, or clubbing. + 2 pedal & radial pulses b/l.   NEUROLOGIC: Cranial nerves II through XII are intact. No focal Motor or sensory deficits appreciated b/l PSYCHIATRIC: The patient is alert and oriented x 3.  SKIN: No obvious rash, lesion, or ulcer.   LABORATORY PANEL:   CBC  Recent Labs Lab 07/01/16 1151  WBC 6.9  HGB 14.9  HCT 44.6  PLT 191   ------------------------------------------------------------------------------------------------------------------  Chemistries   Recent Labs Lab 07/01/16 1151  NA 139  K 3.7  CL 100*  CO2 31  GLUCOSE 182*  BUN 11  CREATININE 1.33*  CALCIUM 8.5*  AST 26  ALT 20  ALKPHOS 69  BILITOT 1.1    ------------------------------------------------------------------------------------------------------------------  Cardiac Enzymes  Recent Labs Lab 07/01/16 1155  TROPONINI <0.03   ------------------------------------------------------------------------------------------------------------------  RADIOLOGY:  Dg Chest 2 View  Result Date: 07/01/2016 CLINICAL DATA:  46 year old male with history of sarcoidosis. Shortness of breath. Low oxygen saturations on room air. Cough for the past 2 days. Mild fever. EXAM: CHEST  2 VIEW COMPARISON:  Chest x-ray 03/25/2016. FINDINGS: Again noted are extensive areas of architectural distortion throughout both lungs, which appear to be chronic and similar to prior studies, presumably related to parenchymal lung disease in the setting of known sarcoidosis. No definite new acute consolidative airspace disease. Elevation of the left hemidiaphragm with chronic blunting of left costophrenic sulcus, which is presumably related to chronic scarring, similar to prior studies. No definite pleural effusions. No evidence of pulmonary edema. Heart size is normal. Upper mediastinal contours are again distorted related to architectural distortion in the perihilar regions, but appear similar to prior studies. IMPRESSION: 1. Chronic changes in the thorax related to underlying sarcoidosis, similar to prior examinations. No radiographic evidence of acute cardiopulmonary disease. Electronically Signed   By: Vinnie Langton M.D.   On: 07/01/2016 12:27   Ct Angio Chest Pe W Or Wo Contrast  Result Date: 07/01/2016 CLINICAL DATA:  Cough and shortness of breath. History of sarcoidosis. EXAM: CT ANGIOGRAPHY CHEST WITH CONTRAST TECHNIQUE: Multidetector CT imaging of the chest was performed using the standard protocol during bolus administration of intravenous contrast. Multiplanar CT image reconstructions and MIPs were obtained to evaluate the vascular anatomy. CONTRAST:  75 mL of  Isovue 370 COMPARISON:  Chest x-ray from today and chest CT February 15, 2015. FINDINGS: Cardiovascular: The heart is unchanged. The thoracic aorta is non aneurysmal with no dissection. No pulmonary emboli are identified. Mediastinum/Nodes: No enlarged mediastinal, hilar, or axillary lymph nodes. Thyroid gland, trachea, and esophagus demonstrate no significant findings. Lungs/Pleura: Central airways are stable. Distortion in the lungs consistent with known sarcoidosis are primarily chronic. Associated emphysematous changes identified. There are patchy nodular opacities primarily in the lung bases which are new when compared to August 15, 2014 and February 15, 2015 suggesting an acute on chronic process. No new large infiltrates. No masses. Upper Abdomen: No acute abnormality. Musculoskeletal: No chest wall abnormality. No acute or significant osseous findings. Review of the MIP images confirms the above findings. IMPRESSION: 1. There are chronic changes and architectural distortion in the lungs consistent with known sarcoidosis. Patchy nodular opacities in the bases are new since previous studies suggesting an acute on chronic infectious or inflammatory process. Recommend follow-up to resolution. 2. No pulmonary emboli. Electronically Signed   By: Dorise Bullion III M.D   On: 07/01/2016 14:53     IMPRESSION AND PLAN:   46 year old male with past medical history of coronary artery disease,  sarcoidosis, COPD with ongoing tobacco abuse, CHF, hypertension, hyperlipidemia who presents to the hospital due to cough and shortness of breath and noted to be in acute respiratory failure with hypoxia.  1. Acute respiratory with hypoxia-secondary to COPD exacerbation. -Continue O2 supplementation, we'll treat with IV steroids, scheduled DuoNeb's, Pulmicort nebs. Follow clinically.  2. COPD exacerbation-secondary to ongoing tobacco abuse and possible underlying bronchitis/pneumonia. -We'll treat the patient with IV  steroids, scheduled DuoNeb's, Pulmicort nebs. -Empirically place the patient on IV ceftriaxone, Zithromax.  3. History of sarcoidosis-follows with Dr. [name]. -Continue treatment for underlying COPD and respiratory failure as mentioned above.  4. Diabetes type 2 without complication-continue Levemir, NovoLog with meals and sliding scale insulin. -Continue carb-controlled diet.  5. Hyperlipidemia-continue atorvastatin.  6. Essential hypertension-continue Norvasc, lisinopril, metoprolol, Isordil  7. Paroxysmal atrial fibrillation-rate controlled, continue metoprolol. - cont. eliquis   All the records are reviewed and case discussed with ED provider. Management plans discussed with the patient, family and they are in agreement.  CODE STATUS: Full code  TOTAL TIME TAKING CARE OF THIS PATIENT: 45 minutes.    Henreitta Leber M.D on 07/01/2016 at 3:42 PM  Between 7am to 6pm - Pager - [phone]  After 6pm go to [email or web address] - password [email or web address] [facility] [facility]  Office  [phone]  CC: Primary care physician; Herminio Commons, MD

## 2016-07-01 NOTE — ED Notes (Signed)
Stared vancomycin. Within 2 minutes, pt started vomiting. Antibiotic stopped. MD notified. Zofran ordered.

## 2016-07-01 NOTE — ED Provider Notes (Signed)
[facility] Emergency Department Provider Note   ____________________________________________   I have reviewed the triage vital signs and the nursing notes.   HISTORY  Chief Complaint Shortness of Breath   History limited by: Not Limited   HPI [name] is a 46 y.o. male who presents to the emergency department today because of concerns for cough, shortness of breath. Patient states the symptoms started yesterday. He did have a little bit of cough and shortness of breath. This morning however when he woke up and the cough was significantly worse. It was severe. Patient has a history of multiple chronic diseases including CHF, COPD and sarcoidosis. Patient also felt like he might of had some chills this morning. He has not noticed any increased swelling. No significant chest pain. No measured fevers.   Past Medical History:  Diagnosis Date  . COPD (chronic obstructive pulmonary disease) ([place])   . Coronary artery disease   . Diabetes mellitus without complication ([place])   . Hypercholesteremia unk  . Hypertension   . Sarcoidosis   . Sleep apnea    does not use CPAP regularly.  . Stroke [facility])     Patient Active Problem List   Diagnosis Date Noted  . Chronic diastolic heart failure ([place]) 03/23/2016  . Hypercarbia   . COPD exacerbation ([place]) 11/17/2015  . HTN (hypertension) 06/14/2015  . Dental caries 03/08/2015  . Morbid obesity ([place]) 02/21/2015  . OSA and COPD overlap syndrome ([place]) 02/21/2015  . Thrombocytopenia ([place]) 02/21/2015  . Hypernatremia 02/21/2015  . Diabetes ([place]) 10/19/2014  . Chronic combined systolic and diastolic congestive heart failure ([place]) 10/18/2014  . Chronic obstructive pulmonary disease ([place]) 10/18/2014  . Sarcoidosis 04/22/2013  . Other and unspecified hyperlipidemia 04/22/2013    Past Surgical History:  Procedure Laterality Date  . APPENDECTOMY      Prior to Admission medications   Medication Sig Start Date  End Date Taking? Authorizing Provider  albuterol (PROVENTIL) (2.5 MG/3ML) 0.083% nebulizer solution Take 2.5 mg by nebulization every 6 (six) hours as needed for wheezing or shortness of breath.    [provider]  amLODipine (NORVASC) 5 MG tablet Take 5 mg by mouth daily. 03/12/16   [provider]  aspirin 81 MG chewable tablet Chew 81 mg by mouth daily.    [provider]  atorvastatin (LIPITOR) 40 MG tablet Take 40 mg by mouth at bedtime.     [provider]  azithromycin (ZITHROMAX) 250 MG tablet Take 1 tablet daily for 5 days 03/28/16   Bettey Costa, MD  Fluticasone-Salmeterol (ADVAIR) 250-50 MCG/DOSE AEPB Inhale 1 puff into the lungs 2 (two) times daily.    [provider]  furosemide (LASIX) 80 MG tablet Take 80 mg by mouth daily.    [provider]  furosemide (LASIX) 80 MG tablet Take 1 tablet (80 mg total) by mouth daily. 05/10/16   Darylene Price A, FNP  insulin detemir (LEVEMIR) 100 UNIT/ML injection Inject 40 Units into the skin at bedtime.     [provider]  insulin lispro (HUMALOG) 100 UNIT/ML injection Inject 8 Units into the skin 3 (three) times daily with meals. Sliding scale as needed    [provider]  ipratropium-albuterol (DUONEB) 0.5-2.5 (3) MG/3ML SOLN Take 3 mLs by nebulization 4 (four) times daily. 02/21/15   Theodoro Grist, MD  isosorbide dinitrate (ISORDIL) 10 MG tablet Take 1 tablet (10 mg total) by mouth 3 (three) times daily. 04/01/15   Alisa Graff, FNP  lisinopril (PRINIVIL,ZESTRIL) 10 MG tablet Take 10 mg by mouth daily.    [provider]  metFORMIN (GLUCOPHAGE-XR) 500 MG 24 hr tablet Take 500 mg by mouth daily with breakfast.     [provider]  metoprolol (LOPRESSOR) 100 MG tablet Take 50 mg by mouth 2 (two) times daily.     [provider]  potassium chloride SA (K-DUR,KLOR-CON) 20 MEQ tablet Take 1 tablet (20 mEq total) by mouth 2 (two) times daily. 04/01/15    Alisa Graff, FNP  predniSONE (DELTASONE) 10 MG tablet Take 1 tablet (10 mg total) by mouth daily with breakfast. 60 mg PO (ORAL) x 2 days 50 mg PO (ORAL)  x 2 days 40 mg PO (ORAL)  x 2 days 30 mg PO  (ORAL)  x 2 days 20 mg PO  (ORAL) x 2 days 10 mg PO  (ORAL) x 2 days then stop 03/28/16   Bettey Costa, MD  tiotropium (SPIRIVA) 18 MCG inhalation capsule Place 18 mcg into inhaler and inhale daily.    [provider]    Allergies Penicillins  Family History  Problem Relation Age of Onset  . Hypertension Mother   . Heart disease Mother   . Diabetes Mother   . Cancer Father     Social History Social History  Substance Use Topics  . Smoking status: Current Every Day Smoker    Packs/day: 0.50    Types: Cigarettes    Last attempt to quit: 02/05/2015  . Smokeless tobacco: Never Used  . Alcohol use 0.0 oz/week     Comment: occassional    Review of Systems Constitutional: Positive for chills. Eyes: No visual changes. ENT: No sore throat. Cardiovascular: Denies chest pain. Respiratory: Positive for shortness of breath and cough Gastrointestinal: No abdominal pain.  No nausea, no vomiting.  No diarrhea.   Genitourinary: Negative for dysuria. Musculoskeletal: Negative for back pain. Skin: Negative for rash. Neurological: Negative for headaches, focal weakness or numbness.  ____________________________________________   PHYSICAL EXAM:  VITAL SIGNS: ED Triage Vitals  Enc Vitals Group     BP 07/01/16 1142 (!) 161/101     Pulse Rate 07/01/16 1142 (!) 110     Resp 07/01/16 1142 (!) 24     Temp 07/01/16 1142 99.6 F (37.6 C)     Temp Source 07/01/16 1142 Oral     SpO2 07/01/16 1142 (!) 88 %     Weight 07/01/16 1143 280 lb (127 kg)     Height 07/01/16 1143 6' (1.829 m)    Constitutional: Alert and oriented. Well appearing and in no distress. Eyes: Conjunctivae are normal.  ENT   Head: Normocephalic and atraumatic.   Nose: No congestion/rhinnorhea.    Mouth/Throat: Mucous membranes are moist.   Neck: No stridor. Hematological/Lymphatic/Immunilogical: No cervical lymphadenopathy. Cardiovascular: Tachycardic, regular rhythm.  No murmurs, rubs, or gallops. Respiratory: Tachypnea. Diffuse expiratory wheezing. Gastrointestinal: Soft and non tender. No rebound. No guarding.  Genitourinary: Deferred Musculoskeletal: Normal range of motion in all extremities. No lower extremity edema. Neurologic:  Normal speech and language. No gross focal neurologic deficits are appreciated.  Skin:  Skin is warm, dry and intact. No rash noted. Psychiatric: Mood and affect are normal. Speech and behavior are normal. Patient exhibits appropriate insight and judgment.  ____________________________________________    LABS (pertinent positives/negatives)  Labs Reviewed  COMPREHENSIVE METABOLIC PANEL - Abnormal; Notable for the following:       Result Value   Chloride 100 (*)    Glucose,  Bld 182 (*)    Creatinine, Ser 1.33 (*)    Calcium 8.5 (*)    All other components within normal limits  CBC WITH DIFFERENTIAL/PLATELET - Abnormal; Notable for the following:    Lymphs Abs 0.9 (*)    Monocytes Absolute 1.7 (*)    All other components within normal limits  CULTURE, BLOOD (ROUTINE X 2)  CULTURE, BLOOD (ROUTINE X 2)  RAPID INFLUENZA A&B ANTIGENS ([facility] ONLY)  LACTIC ACID, PLASMA  PROTIME-INR  TROPONIN I  URINALYSIS, COMPLETE (UACMP) WITH MICROSCOPIC     ____________________________________________   EKG  I, Nance Pear, attending physician, personally viewed and interpreted this EKG  EKG Time: 1146 Rate: 112 Rhythm: sinus tachycardia Axis: left axis deviation Intervals: qtc 496 QRS: RBBB ST changes: no st elevation Impression: abnormal ekg  I, Nance Pear, attending physician, personally viewed and interpreted this EKG  EKG Time: 1410 Rate: 102 Rhythm: sinus tachycardia Axis: left axis deviation Intervals: qtc 498 QRS: RBBB,  LAFB ST changes: no st elevation Impression: abnormal ekg  ____________________________________________    RADIOLOGY  CXR IMPRESSION:  1. Chronic changes in the thorax related to underlying sarcoidosis,  similar to prior examinations. No radiographic evidence of acute  cardiopulmonary disease.   Ct angio pending at time of admission ____________________________________________   PROCEDURES  Procedures   ____________________________________________   INITIAL IMPRESSION / ASSESSMENT AND PLAN / ED COURSE  Pertinent labs & imaging results that were available during my care of the patient were reviewed by me and considered in my medical decision making (see chart for details).  Patient presented to the emergency department today because of concerns for shortness of breath. Initial vital signs showed tachycardia, tachypnea and hypoxia. This was concerning for sepsis. Patient sex x-ray without any obvious pneumonia. He was given Levaquin however when the nurse started vancomycin the patient started having vomiting. Vancomycin was canceled and linezolid was written. Patient will be admitted to the hospital service.  ____________________________________________   FINAL CLINICAL IMPRESSION(S) / ED DIAGNOSES  Final diagnoses:  Shortness of breath  Hypoxia     Note: This dictation was prepared with Dragon dictation. Any transcriptional errors that result from this process are unintentional     Nance Pear, MD 07/01/16 1434

## 2016-07-01 NOTE — ED Triage Notes (Signed)
Pt came in sob and sp02 of 80% on room air. Pt came up to 94% on 4 liters. C/o cough x 2 days, slighly febrile.

## 2016-07-01 NOTE — Progress Notes (Signed)
Pharmacy Antibiotic Note  JERIEL VIVANCO is a 46 y.o. male admitted on 07/01/2016 with CAP.  Pharmacy has been consulted for levofloxacin and vancomycin dosing.  Plan: 1. Levofloxacin 750 mg IV Q24H 2. Vancomycin 1 gm IV x 1 followed in approximately 6 hours (stacked dosing) by vancomycin 1 gm IV Q8H, predicted trough 16 mcg/mL. Pharmacy will continue to follow and adjust as needed to maintain trough 15 to 20 mcg/ml>   Vd 68.2 L, Ke 0.084 hr-1, T1/2 8.3 hr  Height: 6' (182.9 cm) Weight: 280 lb (127 kg) IBW/kg (Calculated) : 77.6  Temp (24hrs), Avg:99.1 F (37.3 C), Min:98.5 F (36.9 C), Max:99.6 F (37.6 C)   Recent Labs Lab 07/01/16 1151  WBC 6.9  CREATININE 1.33*  LATICACIDVEN 1.4    Estimated Creatinine Clearance: 95.6 mL/min (A) (by C-G formula based on SCr of 1.33 mg/dL (H)).    Allergies  Allergen Reactions  . Penicillins Anaphylaxis and Other (See Comments)    Has patient had a PCN reaction causing immediate rash, facial/tongue/throat swelling, SOB or lightheadedness with hypotension: Yes Has patient had a PCN reaction causing severe rash involving mucus membranes or skin necrosis: No Has patient had a PCN reaction that required hospitalization: No Has patient had a PCN reaction occurring within the last 10 years: No If all of the above answers are "NO", then may proceed with Cephalosporin use.     Thank you for allowing pharmacy to be a part of this patient's care.  Laural Benes, Pharm.D., BCPS Clinical Pharmacist 07/01/2016 1:38 PM

## 2016-07-01 NOTE — Progress Notes (Signed)
Pt admitted to room 241. He is alert & oriented x 4. Denies any complaints of pain, is experiencing SOB, but saturations are in the mid 90s on 4L of O2 via [place]. Current VSS: BP 134/87 (BP Location: Right Arm)   Pulse (!) 106   Temp 98.8 F (37.1 C) (Oral)   Resp 14   Ht 6' (1.829 m)   Wt 127 kg (280 lb)   SpO2 95%   BMI 37.97 kg/m .

## 2016-07-02 ENCOUNTER — Encounter: Payer: Self-pay | Admitting: *Deleted

## 2016-07-02 LAB — BASIC METABOLIC PANEL
ANION GAP: 8 (ref 5–15)
BUN: 12 mg/dL (ref 6–20)
CHLORIDE: 96 mmol/L — AB (ref 101–111)
CO2: 34 mmol/L — ABNORMAL HIGH (ref 22–32)
Calcium: 8.4 mg/dL — ABNORMAL LOW (ref 8.9–10.3)
Creatinine, Ser: 1.42 mg/dL — ABNORMAL HIGH (ref 0.61–1.24)
GFR calc Af Amer: >60 mL/min (ref >60)
GFR, EST NON AFRICAN AMERICAN: 58 mL/min — AB (ref >60)
GLUCOSE: 171 mg/dL — AB (ref 65–99)
Potassium: 5.4 mmol/L — ABNORMAL HIGH (ref 3.5–5.1)
SODIUM: 138 mmol/L (ref 135–145)

## 2016-07-02 LAB — GLUCOSE, CAPILLARY
GLUCOSE-CAPILLARY: 193 mg/dL — AB (ref 65–99)
GLUCOSE-CAPILLARY: 130 mg/dL — AB (ref 65–99)
Glucose-Capillary: 168 mg/dL — ABNORMAL HIGH (ref 65–99)
Glucose-Capillary: 129 mg/dL — ABNORMAL HIGH (ref 65–99)

## 2016-07-02 LAB — CBC
HEMATOCRIT: 47 % (ref 40.0–52.0)
HEMOGLOBIN: 15.5 g/dL (ref 13.0–18.0)
MCH: 27.7 pg (ref 26.0–34.0)
MCHC: 33 g/dL (ref 32.0–36.0)
MCV: 84.1 fL (ref 80.0–100.0)
Platelets: 201 10*3/uL (ref 150–440)
RBC: 5.6 MIL/uL (ref 4.40–5.90)
RDW: 14.1 % (ref 11.5–14.5)
WBC: 5.6 10*3/uL (ref 3.8–10.6)

## 2016-07-02 MED ORDER — METHYLPREDNISOLONE SODIUM SUCC 125 MG IJ SOLR
60.0000 mg | Freq: Every day | INTRAMUSCULAR | Status: DC
  Administered 2016-07-03: 60 mg via INTRAVENOUS
  Filled 2016-07-02: qty 2

## 2016-07-02 MED ORDER — METOPROLOL TARTRATE 100 MG PO TABS
100.0000 mg | ORAL_TABLET | Freq: Every day | ORAL | Status: DC
  Administered 2016-07-02 – 2016-07-03 (×2): 100 mg via ORAL
  Filled 2016-07-02 (×2): qty 1

## 2016-07-02 MED ORDER — NITROGLYCERIN 0.4 MG SL SUBL
0.4000 mg | SUBLINGUAL_TABLET | SUBLINGUAL | Status: DC | PRN
Start: 2016-07-02 — End: 2016-07-03
  Administered 2016-07-02: 0.4 mg via SUBLINGUAL
  Filled 2016-07-02: qty 1

## 2016-07-02 MED ORDER — TIOTROPIUM BROMIDE MONOHYDRATE 18 MCG IN CAPS
18.0000 ug | ORAL_CAPSULE | Freq: Every morning | RESPIRATORY_TRACT | Status: DC
  Administered 2016-07-02 – 2016-07-03 (×2): 18 ug via RESPIRATORY_TRACT
  Filled 2016-07-02: qty 5

## 2016-07-02 MED ORDER — LEVOFLOXACIN 750 MG PO TABS
750.0000 mg | ORAL_TABLET | Freq: Every day | ORAL | Status: DC
  Administered 2016-07-02 – 2016-07-03 (×2): 750 mg via ORAL
  Filled 2016-07-02 (×2): qty 1

## 2016-07-02 MED ORDER — FLUTICASONE PROPIONATE 50 MCG/ACT NA SUSP
2.0000 | Freq: Every day | NASAL | Status: DC
  Administered 2016-07-02 – 2016-07-03 (×2): 2 via NASAL
  Filled 2016-07-02: qty 16

## 2016-07-02 MED ORDER — METFORMIN HCL ER 500 MG PO TB24: 500.0000 mg | ORAL_TABLET | Freq: Every day | ORAL | Status: DC

## 2016-07-02 MED ORDER — MOMETASONE FURO-FORMOTEROL FUM 200-5 MCG/ACT IN AERO
2.0000 | INHALATION_SPRAY | Freq: Two times a day (BID) | RESPIRATORY_TRACT | Status: DC
  Administered 2016-07-02 – 2016-07-03 (×3): 2 via RESPIRATORY_TRACT
  Filled 2016-07-02: qty 8.8

## 2016-07-02 NOTE — Progress Notes (Signed)
Nutrition Brief Note  Patient identified on the Malnutrition Screening Tool (MST) Report  Wt Readings from Last 15 Encounters:  07/01/16 280 lb (127 kg)  03/26/16 289 lb (131.1 kg)  03/22/16 290 lb 2 oz (131.6 kg)  12/20/15 296 lb (134.3 kg)  11/18/15 277 lb 4.8 oz (125.8 kg)  09/15/15 299 lb (135.6 kg)  06/14/15 291 lb (132 kg)  03/15/15 296 lb (134.3 kg)  03/08/15 300 lb (136.1 kg)  02/21/15 286 lb 14.4 oz (130.1 kg)  02/09/15 275 lb (124.7 kg)  12/28/14 287 lb (130.2 kg)  11/17/14 290 lb (131.5 kg)  10/18/14 291 lb (132 kg)  08/25/14 291 lb 14.4 oz (132.4 kg)   Spoke with patient at bedside. Weight loss has been intentional due to dietary changes reports "I needed to lose the weight." He has lost approximately 16 lbs (5.4% body weight) over 6 months, which is not significant for time frame. Continues to eat 100% of two meals daily plus occasional snacks.   Nutrition-Focused physical exam completed. Findings are no fat depletion, no muscle depletion, and mild edema.   Patient does not meet criteria for malnutrition.  Body mass index is 37.97 kg/m. Patient meets criteria for Obesity Class III based on current BMI.   Current diet order is Heart Healthy/Carbohydrate Modified, patient is consuming approximately 100% of meals at this time. Labs and medications reviewed.   No nutrition interventions warranted at this time. If nutrition issues arise, please consult RD.   Willey Blade, MS, RD, LDN Pager: [phone] After Hours Pager: [phone]

## 2016-07-02 NOTE — Progress Notes (Signed)
PHARMACIST - PHYSICIAN COMMUNICATION DR:   Archie Balboa CONCERNING: Antibiotic IV to Oral Route Change Policy  RECOMMENDATION: This patient is receiving Levofloxacin  by the intravenous route.  Based on criteria approved by the Pharmacy and Therapeutics Committee, the antibiotic(s) is/are being converted to the equivalent oral dose form(s).   DESCRIPTION: These criteria include:  Patient being treated for a respiratory tract infection, urinary tract infection, cellulitis or clostridium difficile associated diarrhea if on metronidazole  The patient is not neutropenic and does not exhibit a GI malabsorption state  The patient is eating (either orally or via tube) and/or has been taking other orally administered medications for a least 24 hours  The patient is improving clinically and has a Tmax < 100.5  If you have questions about this conversion, please contact the Pharmacy Department  []   [phone] )  [facility] [x]   [phone] )  [facility] []   [phone] )  [facility] []   [phone] )  [facility] []   [phone] )  [facility]   [place], PharmD

## 2016-07-02 NOTE — Progress Notes (Signed)
Pt. Indicated he uses BIPAP at night at  Home. Dr. Ara Kussmaul notified with the new order for that. Pt. Is wearing his BIPAP at this moment with no complain. No acute distress noted. Will continue to monitor.

## 2016-07-03 LAB — CREATININE, SERUM
Creatinine, Ser: 1.73 mg/dL — ABNORMAL HIGH (ref 0.61–1.24)
GFR, EST AFRICAN AMERICAN: 53 mL/min — AB (ref >60)
GFR, EST NON AFRICAN AMERICAN: 46 mL/min — AB (ref >60)

## 2016-07-03 LAB — CBC
HCT: 46.9 % (ref 40.0–52.0)
HEMOGLOBIN: 15.8 g/dL (ref 13.0–18.0)
MCH: 28.3 pg (ref 26.0–34.0)
MCHC: 33.6 g/dL (ref 32.0–36.0)
MCV: 84.3 fL (ref 80.0–100.0)
PLATELETS: 201 10*3/uL (ref 150–440)
RBC: 5.56 MIL/uL (ref 4.40–5.90)
RDW: 14.4 % (ref 11.5–14.5)
WBC: 9.8 10*3/uL (ref 3.8–10.6)

## 2016-07-03 LAB — GLUCOSE, CAPILLARY
Glucose-Capillary: 125 mg/dL — ABNORMAL HIGH (ref 65–99)
Glucose-Capillary: 142 mg/dL — ABNORMAL HIGH (ref 65–99)

## 2016-07-03 LAB — POTASSIUM: Potassium: 4.4 mmol/L (ref 3.5–5.1)

## 2016-07-03 MED ORDER — METOPROLOL TARTRATE 50 MG PO TABS: 50.0000 mg | ORAL_TABLET | Freq: Two times a day (BID) | ORAL | 0 refills | Status: DC

## 2016-07-03 MED ORDER — FLUTICASONE PROPIONATE 50 MCG/ACT NA SUSP: 2.0000 | Freq: Every day | NASAL | 2 refills | Status: DC

## 2016-07-03 MED ORDER — PREDNISONE 10 MG (21) PO TBPK: ORAL_TABLET | ORAL | 0 refills | Status: DC

## 2016-07-03 MED ORDER — LEVOFLOXACIN 750 MG PO TABS: 750.0000 mg | ORAL_TABLET | Freq: Every day | ORAL | 0 refills | Status: AC

## 2016-07-03 NOTE — Progress Notes (Signed)
[place], [place].   07/03/2016  Patient: [name]   Date of Birth:  08-Jul-1970  Date of admission:  07/01/2016  Date of Discharge  07/03/2016    To Whom it May Concern:   [name]  may return to work on 07/05/16. He will need some assistance at home for next 2 days due to his respiratory issues.  PHYSICAL ACTIVITY:  Moderate  If you have any questions or concerns, please don't hesitate to call.  Sincerely,   Vaughan Basta M.D Pager [phone] Office : [phone]   .

## 2016-07-03 NOTE — Progress Notes (Signed)
MD notified. Pts BP is low. Reviewed BP meds with patient. Notified MD for clarification with BP meds. Metoprolol reduced per pt verification. I will continue to assess.

## 2016-07-03 NOTE — Progress Notes (Signed)
[place] [facility] [place] NAME: [name]    MR#:  [id number]  DATE OF BIRTH:  1970/07/27  SUBJECTIVE:   Came in with increasing sob and congestion REVIEW OF SYSTEMS:   Review of Systems  Constitutional: Negative for chills, fever and weight loss.  HENT: Negative for ear discharge, ear pain and nosebleeds.   Eyes: Negative for blurred vision, pain and discharge.  Respiratory: Positive for shortness of breath. Negative for sputum production, wheezing and stridor.   Cardiovascular: Negative for chest pain, palpitations, orthopnea and PND.  Gastrointestinal: Negative for abdominal pain, diarrhea, nausea and vomiting.  Genitourinary: Negative for frequency and urgency.  Musculoskeletal: Negative for back pain and joint pain.  Neurological: Positive for weakness. Negative for sensory change, speech change and focal weakness.  Psychiatric/Behavioral: Negative for depression and hallucinations. The patient is not nervous/anxious.    Tolerating Diet:yes Tolerating PT: amublatory  DRUG ALLERGIES:   Allergies  Allergen Reactions  . Penicillins Anaphylaxis and Other (See Comments)    Has patient had a PCN reaction causing immediate rash, facial/tongue/throat swelling, SOB or lightheadedness with hypotension: Yes Has patient had a PCN reaction causing severe rash involving mucus membranes or skin necrosis: No Has patient had a PCN reaction that required hospitalization: No Has patient had a PCN reaction occurring within the last 10 years: No If all of the above answers are "NO", then may proceed with Cephalosporin use.     VITALS:  Blood pressure (!) 92/59, pulse 81, temperature 98 F (36.7 C), temperature source Oral, resp. rate 18, height 6' (1.829 m), weight 127 kg (280 lb), SpO2 94 %.  PHYSICAL EXAMINATION:   Physical Exam  GENERAL:  46 y.o.-year-old patient lying in the bed with no acute distress. obeisty EYES: Pupils equal,  round, reactive to light and accommodation. No scleral icterus. Extraocular muscles intact.  HEENT: Head atraumatic, normocephalic. Oropharynx and nasopharynx clear.  NECK:  Supple, no jugular venous distention. No thyroid enlargement, no tenderness.  LUNGS: distant breath sounds bilaterally, no wheezing, rales, rhonchi. No use of accessory muscles of respiration.  CARDIOVASCULAR: S1, S2 normal. No murmurs, rubs, or gallops.  ABDOMEN: Soft, nontender, nondistended. Bowel sounds present. No organomegaly or mass.  EXTREMITIES: No cyanosis, clubbing or edema b/l.    NEUROLOGIC: Cranial nerves II through XII are intact. No focal Motor or sensory deficits b/l.   PSYCHIATRIC:  patient is alert and oriented x 3.  SKIN: No obvious rash, lesion, or ulcer.   LABORATORY PANEL:  CBC  Recent Labs Lab 07/03/16 0427  WBC 9.8  HGB 15.8  HCT 46.9  PLT 201    Chemistries   Recent Labs Lab 07/01/16 1151 07/02/16 0428 07/03/16 0427  NA 139 138  --   K 3.7 5.4*  --   CL 100* 96*  --   CO2 31 34*  --   GLUCOSE 182* 171*  --   BUN 11 12  --   CREATININE 1.33* 1.42* 1.73*  CALCIUM 8.5* 8.4*  --   AST 26  --   --   ALT 20  --   --   ALKPHOS 69  --   --   BILITOT 1.1  --   --    Cardiac Enzymes  Recent Labs Lab 07/01/16 1155  TROPONINI <0.03   RADIOLOGY:  Ct Angio Chest Pe W Or Wo Contrast  Result Date: 07/01/2016 CLINICAL DATA:  Cough and shortness of breath. History of sarcoidosis. EXAM: CT  ANGIOGRAPHY CHEST WITH CONTRAST TECHNIQUE: Multidetector CT imaging of the chest was performed using the standard protocol during bolus administration of intravenous contrast. Multiplanar CT image reconstructions and MIPs were obtained to evaluate the vascular anatomy. CONTRAST:  75 mL of Isovue 370 COMPARISON:  Chest x-ray from today and chest CT February 15, 2015. FINDINGS: Cardiovascular: The heart is unchanged. The thoracic aorta is non aneurysmal with no dissection. No pulmonary emboli are  identified. Mediastinum/Nodes: No enlarged mediastinal, hilar, or axillary lymph nodes. Thyroid gland, trachea, and esophagus demonstrate no significant findings. Lungs/Pleura: Central airways are stable. Distortion in the lungs consistent with known sarcoidosis are primarily chronic. Associated emphysematous changes identified. There are patchy nodular opacities primarily in the lung bases which are new when compared to August 15, 2014 and February 15, 2015 suggesting an acute on chronic process. No new large infiltrates. No masses. Upper Abdomen: No acute abnormality. Musculoskeletal: No chest wall abnormality. No acute or significant osseous findings. Review of the MIP images confirms the above findings. IMPRESSION: 1. There are chronic changes and architectural distortion in the lungs consistent with known sarcoidosis. Patchy nodular opacities in the bases are new since previous studies suggesting an acute on chronic infectious or inflammatory process. Recommend follow-up to resolution. 2. No pulmonary emboli. Electronically Signed   By: Dorise Bullion III M.D   On: 07/01/2016 14:53   ASSESSMENT AND PLAN:  46 year old male with past medical history of coronary artery disease, sarcoidosis, COPD with ongoing tobacco abuse, CHF, hypertension, hyperlipidemia who presents to the hospital due to cough and shortness of breath and noted to be in acute respiratory failure with hypoxia.  1. Acute respiratory with hypoxia-secondary to COPD exacerbation. -Continue O2 supplementation, IV steroids, scheduled DuoNeb's, Pulmicort nebs. Follow clinically.  2. COPD exacerbation-secondary to ongoing tobacco abuse and possible underlying bronchitis/pneumonia. - IV steroids, scheduled DuoNeb's, Pulmicort nebs. -Empirically place the patient on IV ceftriaxone, Zithromax.  3. History of sarcoidosis-follows with Dr. [name]. -Continue treatment for underlying COPD and respiratory failure as mentioned above.  4.  Diabetes type 2 without complication-continue Levemir, NovoLog with meals and sliding scale insulin. -Continue carb-controlled diet.  5. Hyperlipidemia-continue atorvastatin.  6. Essential hypertension-continue Norvasc, lisinopril, metoprolol, Isordil  7. Paroxysmal atrial fibrillation-rate controlled, continue metoprolol. - cont. eliquis    Case discussed with Care Management/Social Worker. Management plans discussed with the patient, family and they are in agreement.  CODE STATUS: full  DVT Prophylaxis: lovenox  TOTAL TIME TAKING CARE OF THIS PATIENT: *30* minutes.  >50% time spent on counselling and coordination of care  POSSIBLE D/C IN *1-2* DAYS, DEPENDING ON CLINICAL CONDITION.  Note: This dictation was prepared with Dragon dictation along with smaller phrase technology. Any transcriptional errors that result from this process are unintentional.  Tyrena Gohr M.D    Between 7am to 6pm - Pager - [phone]  After 6pm go to [email or web address] - password [email or web address] [place] [facility]  Office  [phone]  CC: Primary care physician; Herminio Commons, MD

## 2016-07-03 NOTE — Care Management Important Message (Signed)
Important Message  Patient Details  Name: [name] MRN: [id number] Date of Birth: [birth year]   Medicare Important Message Given:  Yes Signed IM notice given   Katrina Stack, RN 07/03/2016, 9:20 AM

## 2016-07-03 NOTE — Discharge Summary (Addendum)
[facility] NAME: [name]    MR#:  [id number]  DATE OF BIRTH:  1970-08-18  DATE OF ADMISSION:  07/01/2016 ADMITTING PHYSICIAN: Henreitta Leber, MD  DATE OF DISCHARGE: 07/03/2016  PRIMARY CARE PHYSICIAN: Soles, Howell Rucks, MD    ADMISSION DIAGNOSIS:  Shortness of breath [R06.02] Hypoxia [R09.02]  DISCHARGE DIAGNOSIS:  Active Problems:   COPD exacerbation (HCC)   SECONDARY DIAGNOSIS:   Past Medical History:  Diagnosis Date  . CHF (congestive heart failure) ([place])   . COPD (chronic obstructive pulmonary disease) ([place])   . Coronary artery disease   . Diabetes mellitus without complication ([place])   . Hypercholesteremia unk  . Hypertension   . Sarcoidosis   . Sleep apnea    does not use CPAP regularly.  . Stroke [facility])     HOSPITAL COURSE:   46 year old male with past medical history of coronary artery disease, sarcoidosis, COPD with ongoing tobacco abuse, CHF, hypertension, hyperlipidemia who presents to the hospital due to cough and shortness of breath and noted to be in acute respiratory failure with hypoxia.  1. Acute respiratory with hypoxia-secondary to COPD exacerbation. -Continue O2 supplementation, treated with IV steroids, scheduled DuoNeb's, Pulmicort nebs. Follow clinically.   Improved, d/c home. Have bipap, nebs and oxygen at home for night use.   Have appointment with Pulmonary clinic in 2 weeks.  2. COPD exacerbation-secondary to ongoing tobacco abuse and possible underlying bronchitis/pneumonia. -We'll treat the patient with IV steroids, scheduled DuoNeb's, Pulmicort nebs. -Empirically place on Abx.  3. History of sarcoidosis-follows with Dr. [name]. -Continue treatment for underlying COPD and respiratory failure as mentioned above. - improved.  4. Diabetes type 2 without complication-continue Levemir, NovoLog with meals and sliding scale insulin. -Continue carb-controlled  diet.  5. Hyperlipidemia-continue atorvastatin.  6. Essential hypertension-continue Norvasc, lisinopril, metoprolol, Isordil  7. Paroxysmal atrial fibrillation-rate controlled, continue metoprolol. - cont. eliquis   DISCHARGE CONDITIONS:   Stable.  CONSULTS OBTAINED:    DRUG ALLERGIES:   Allergies  Allergen Reactions  . Penicillins Anaphylaxis and Other (See Comments)    Has patient had a PCN reaction causing immediate rash, facial/tongue/throat swelling, SOB or lightheadedness with hypotension: Yes Has patient had a PCN reaction causing severe rash involving mucus membranes or skin necrosis: No Has patient had a PCN reaction that required hospitalization: No Has patient had a PCN reaction occurring within the last 10 years: No If all of the above answers are "NO", then may proceed with Cephalosporin use.     DISCHARGE MEDICATIONS:   Current Discharge Medication List    START taking these medications   Details  fluticasone (FLONASE) 50 MCG/ACT nasal spray Place 2 sprays into both nostrils daily. Qty: 16 g, Refills: 2    levofloxacin (LEVAQUIN) 750 MG tablet Take 1 tablet (750 mg total) by mouth daily. Qty: 3 tablet, Refills: 0    predniSONE (STERAPRED UNI-PAK 21 TAB) 10 MG (21) TBPK tablet Take 6 tabs first day, 5 tab on day 2, then 4 on day 3rd, 3 tabs on day 4th , 2 tab on day 5th, and 1 tab on 6th day. Qty: 21 tablet, Refills: 0      CONTINUE these medications which have CHANGED   Details  metoprolol tartrate (LOPRESSOR) 50 MG tablet Take 1 tablet (50 mg total) by mouth 2 (two) times daily. Qty: 60 tablet, Refills: 0      CONTINUE these medications which have NOT CHANGED   Details  albuterol (PROVENTIL) (2.5 MG/3ML) 0.083% nebulizer solution Take 2.5 mg by nebulization every 6 (six) hours as needed for wheezing or shortness of breath.    amLODipine (NORVASC) 5 MG tablet Take 5 mg by mouth daily.    apixaban (ELIQUIS) 5 MG TABS tablet Take 5 mg by mouth  2 (two) times daily.    aspirin 81 MG chewable tablet Chew 81 mg by mouth daily.    atorvastatin (LIPITOR) 40 MG tablet Take 40 mg by mouth at bedtime.     Fluticasone-Salmeterol (ADVAIR) 250-50 MCG/DOSE AEPB Inhale 1 puff into the lungs 2 (two) times daily.    furosemide (LASIX) 80 MG tablet Take 1 tablet (80 mg total) by mouth daily. Qty: 30 tablet, Refills: 5    insulin detemir (LEVEMIR) 100 UNIT/ML injection Inject 40-50 Units into the skin at bedtime.     insulin lispro (HUMALOG) 100 UNIT/ML injection Inject 12-15 Units into the skin 3 (three) times daily with meals. Sliding scale as needed    ipratropium-albuterol (DUONEB) 0.5-2.5 (3) MG/3ML SOLN Take 3 mLs by nebulization 4 (four) times daily. Qty: 360 mL, Refills: 6    isosorbide dinitrate (ISORDIL) 10 MG tablet Take 1 tablet (10 mg total) by mouth 3 (three) times daily. Qty: 90 tablet, Refills: 5    lisinopril (PRINIVIL,ZESTRIL) 10 MG tablet Take 10 mg by mouth daily.    metFORMIN (GLUCOPHAGE-XR) 500 MG 24 hr tablet Take 500 mg by mouth daily with breakfast.     potassium chloride SA (K-DUR,KLOR-CON) 20 MEQ tablet Take 1 tablet (20 mEq total) by mouth 2 (two) times daily. Qty: 60 tablet, Refills: 5    theophylline (THEODUR) 300 MG 12 hr tablet Take 300 mg by mouth daily.    tiotropium (SPIRIVA) 18 MCG inhalation capsule Place 18 mcg into inhaler and inhale daily.         DISCHARGE INSTRUCTIONS:    Follow with pulmonary clinic in 2 weeks.  If you experience worsening of your admission symptoms, develop shortness of breath, life threatening emergency, suicidal or homicidal thoughts you must seek medical attention immediately by calling 911 or calling your MD immediately  if symptoms less severe.  You Must read complete instructions/literature along with all the possible adverse reactions/side effects for all the Medicines you take and that have been prescribed to you. Take any new Medicines after you have completely  understood and accept all the possible adverse reactions/side effects.   Please note  You were cared for by a hospitalist during your hospital stay. If you have any questions about your discharge medications or the care you received while you were in the hospital after you are discharged, you can call the unit and asked to speak with the hospitalist on call if the hospitalist that took care of you is not available. Once you are discharged, your primary care physician will handle any further medical issues. Please note that NO REFILLS for any discharge medications will be authorized once you are discharged, as it is imperative that you return to your primary care physician (or establish a relationship with a primary care physician if you do not have one) for your aftercare needs so that they can reassess your need for medications and monitor your lab values.    Today   CHIEF COMPLAINT:   Chief Complaint  Patient presents with  . Shortness of Breath    HISTORY OF PRESENT ILLNESS:  [name]  is a 46 y.o. male with a known history of Sarcoidosis, CHF,  COPD with ongoing tobacco abuse, history of coronary artery disease, diabetes, hypertension, hyperlipidemia, obstructive sleep apnea presents to the hospital due to shortness of breath cough. Patient says he developed worsening shortness of breath and cough late last night and early this morning. He doesn't use his nebulizer treatments at home along with his home oxygen at bedtime but it did not alleviate his symptoms. He admits to cough which is productive with clear sputum. He presented to the ER have low-grade fever 99.6 and was also noted to be tachycardic, tachypneic. He was noted to be in acute respiratory failure with hypoxia secondary to COPD exacerbation and hospitalist services were contacted further treatment and evaluation. She admits some chest tightness with his shortness of breath but no acute chest pain, no nausea, vomiting,  abdominal pain, melena, hematochezia or hematuria or any other associated symptoms presently.  VITAL SIGNS:  Blood pressure (!) 88/54, pulse 79, temperature 98 F (36.7 C), temperature source Oral, resp. rate 18, height 6' (1.829 m), weight 127 kg (280 lb), SpO2 94 %.  I/O:    Intake/Output Summary (Last 24 hours) at 07/03/16 1238 Last data filed at 07/03/16 0944  Gross per 24 hour  Intake             1080 ml  Output             2400 ml  Net            -1320 ml    PHYSICAL EXAMINATION:  GENERAL:  46 y.o.-year-old patient lying in the bed with no acute distress.  EYES: Pupils equal, round, reactive to light and accommodation. No scleral icterus. Extraocular muscles intact.  HEENT: Head atraumatic, normocephalic. Oropharynx and nasopharynx clear.  NECK:  Supple, no jugular venous distention. No thyroid enlargement, no tenderness.  LUNGS: Normal breath sounds bilaterally, some wheezing, no crepitation. No use of accessory muscles of respiration.  CARDIOVASCULAR: S1, S2 normal. No murmurs, rubs, or gallops.  ABDOMEN: Soft, non-tender, non-distended. Bowel sounds present. No organomegaly or mass.  EXTREMITIES: No pedal edema, cyanosis, or clubbing.  NEUROLOGIC: Cranial nerves II through XII are intact. Muscle strength 5/5 in all extremities. Sensation intact. Gait not checked.  PSYCHIATRIC: The patient is alert and oriented x 3.  SKIN: No obvious rash, lesion, or ulcer.   DATA REVIEW:   CBC  Recent Labs Lab 07/03/16 0427  WBC 9.8  HGB 15.8  HCT 46.9  PLT 201    Chemistries   Recent Labs Lab 07/01/16 1151 07/02/16 0428 07/03/16 0427  NA 139 138  --   K 3.7 5.4*  --   CL 100* 96*  --   CO2 31 34*  --   GLUCOSE 182* 171*  --   BUN 11 12  --   CREATININE 1.33* 1.42* 1.73*  CALCIUM 8.5* 8.4*  --   AST 26  --   --   ALT 20  --   --   ALKPHOS 69  --   --   BILITOT 1.1  --   --     Cardiac Enzymes  Recent Labs Lab 07/01/16 1155  TROPONINI <0.03     Microbiology Results  Results for orders placed or performed during the hospital encounter of 07/01/16  Blood Culture (routine x 2)     Status: None (Preliminary result)   Collection Time: 07/01/16 11:51 AM  Result Value Ref Range Status   Specimen Description BLOOD LEFT ARM  Final   Special Requests   Final  BOTTLES DRAWN AEROBIC AND ANAEROBIC Blood Culture results may not be optimal due to an excessive volume of blood received in culture bottles   Culture NO GROWTH 2 DAYS  Final   Report Status PENDING  Incomplete  Blood Culture (routine x 2)     Status: None (Preliminary result)   Collection Time: 07/01/16 11:53 AM  Result Value Ref Range Status   Specimen Description BLOOD RIGHT ASSIST CONTROL  Final   Special Requests   Final    BOTTLES DRAWN AEROBIC AND ANAEROBIC Blood Culture results may not be optimal due to an excessive volume of blood received in culture bottles   Culture NO GROWTH 2 DAYS  Final   Report Status PENDING  Incomplete  MRSA PCR Screening     Status: None   Collection Time: 07/01/16  5:34 PM  Result Value Ref Range Status   MRSA by PCR NEGATIVE NEGATIVE Final    Comment:        The GeneXpert MRSA Assay (FDA approved for NASAL specimens only), is one component of a comprehensive MRSA colonization surveillance program. It is not intended to diagnose MRSA infection nor to guide or monitor treatment for MRSA infections.     RADIOLOGY:  Ct Angio Chest Pe W Or Wo Contrast  Result Date: 07/01/2016 CLINICAL DATA:  Cough and shortness of breath. History of sarcoidosis. EXAM: CT ANGIOGRAPHY CHEST WITH CONTRAST TECHNIQUE: Multidetector CT imaging of the chest was performed using the standard protocol during bolus administration of intravenous contrast. Multiplanar CT image reconstructions and MIPs were obtained to evaluate the vascular anatomy. CONTRAST:  75 mL of Isovue 370 COMPARISON:  Chest x-ray from today and chest CT February 15, 2015. FINDINGS:  Cardiovascular: The heart is unchanged. The thoracic aorta is non aneurysmal with no dissection. No pulmonary emboli are identified. Mediastinum/Nodes: No enlarged mediastinal, hilar, or axillary lymph nodes. Thyroid gland, trachea, and esophagus demonstrate no significant findings. Lungs/Pleura: Central airways are stable. Distortion in the lungs consistent with known sarcoidosis are primarily chronic. Associated emphysematous changes identified. There are patchy nodular opacities primarily in the lung bases which are new when compared to August 15, 2014 and February 15, 2015 suggesting an acute on chronic process. No new large infiltrates. No masses. Upper Abdomen: No acute abnormality. Musculoskeletal: No chest wall abnormality. No acute or significant osseous findings. Review of the MIP images confirms the above findings. IMPRESSION: 1. There are chronic changes and architectural distortion in the lungs consistent with known sarcoidosis. Patchy nodular opacities in the bases are new since previous studies suggesting an acute on chronic infectious or inflammatory process. Recommend follow-up to resolution. 2. No pulmonary emboli. Electronically Signed   By: Dorise Bullion III M.D   On: 07/01/2016 14:53    EKG:   Orders placed or performed during the hospital encounter of 07/01/16  . EKG 12-Lead  . EKG 12-Lead  . ED EKG  . ED EKG  . ED EKG  . ED EKG  . EKG 12-Lead  . EKG 12-Lead      Management plans discussed with the patient, family and they are in agreement.  CODE STATUS:     Code Status Orders        Start     Ordered   07/01/16 1701  Full code  Continuous     07/01/16 1700    Code Status History    Date Active Date Inactive Code Status Order ID Comments User Context   03/26/2016 12:43 AM 03/28/2016  8:01  PM Full Code [id number]  Harvie Bridge, DO Inpatient   11/17/2015 11:31 AM 11/18/2015  6:45 PM Full Code [id number]  Bettey Costa, MD Inpatient   02/11/2015  2:17 PM 02/21/2015   6:41 PM Full Code [id number]  Henreitta Leber, MD Inpatient   12/28/2014  9:34 PM 12/30/2014  4:02 PM Full Code [id number]  Fritzi Mandes, MD Inpatient   08/24/2014  2:45 AM 08/26/2014  6:19 PM Full Code [id number]  Hower, Aaron Mose, MD ED   08/14/2014 10:31 AM 08/18/2014  6:30 PM Full Code [id number]  Vaughan Basta, MD Inpatient    Advance Directive Documentation     Most Recent Value  Type of Advance Directive  Healthcare Power of [place], Living will [pt is unsure he states that his wife takes of it]  Pre-existing out of facility DNR order (yellow form or pink MOST form)  -  "MOST" Form in Place?  -      TOTAL TIME TAKING CARE OF THIS PATIENT: 35 minutes.    Vaughan Basta M.D on 07/03/2016 at 12:38 PM  Between 7am to 6pm - Pager - [phone]  After 6pm go to [email or web address] - password [email or web address] [facility]  Office  [phone]  CC: Primary care physician; Herminio Commons, MD   Note: This dictation was prepared with Dragon dictation along with smaller phrase technology. Any transcriptional errors that result from this process are unintentional.

## 2016-07-03 NOTE — Care Management (Signed)
Admitted from home with exacerbation of copd.  Chronic home oxygen through [facility].  Chronic home bipap.  No in home services at present.  Prior to this episode of illness, patient independent in all adls, denies issues accessing medical care, obtaining medications or with transportation.  Current with  PCP.  Wife will require work note to be able to stay with patient a few days after discharge.

## 2016-07-06 LAB — CULTURE, BLOOD (ROUTINE X 2)
Culture: NO GROWTH
Culture: NO GROWTH

## 2016-07-14 ENCOUNTER — Encounter: Payer: Self-pay | Admitting: Emergency Medicine

## 2016-07-14 ENCOUNTER — Emergency Department: Payer: Medicare Other

## 2016-07-14 ENCOUNTER — Observation Stay
Admission: EM | Admit: 2016-07-14 | Discharge: 2016-07-16 | Disposition: A | Discharge status: Home / Self Care | Payer: Medicare Other | Attending: Internal Medicine | Admitting: Internal Medicine

## 2016-07-14 DIAGNOSIS — Z809 Family history of malignant neoplasm, unspecified: Secondary | ICD-10-CM | POA: Insufficient documentation

## 2016-07-14 DIAGNOSIS — Z79899 Other long term (current) drug therapy: Secondary | ICD-10-CM | POA: Diagnosis not present

## 2016-07-14 DIAGNOSIS — J441 Chronic obstructive pulmonary disease with (acute) exacerbation: Principal | ICD-10-CM | POA: Diagnosis present

## 2016-07-14 DIAGNOSIS — I2510 Atherosclerotic heart disease of native coronary artery without angina pectoris: Secondary | ICD-10-CM | POA: Diagnosis not present

## 2016-07-14 DIAGNOSIS — Z7982 Long term (current) use of aspirin: Secondary | ICD-10-CM | POA: Insufficient documentation

## 2016-07-14 DIAGNOSIS — E7800 Pure hypercholesterolemia, unspecified: Secondary | ICD-10-CM | POA: Insufficient documentation

## 2016-07-14 DIAGNOSIS — J069 Acute upper respiratory infection, unspecified: Secondary | ICD-10-CM | POA: Diagnosis not present

## 2016-07-14 DIAGNOSIS — Z9981 Dependence on supplemental oxygen: Secondary | ICD-10-CM | POA: Insufficient documentation

## 2016-07-14 DIAGNOSIS — J449 Chronic obstructive pulmonary disease, unspecified: Secondary | ICD-10-CM | POA: Diagnosis present

## 2016-07-14 DIAGNOSIS — I509 Heart failure, unspecified: Secondary | ICD-10-CM | POA: Diagnosis not present

## 2016-07-14 DIAGNOSIS — G4730 Sleep apnea, unspecified: Secondary | ICD-10-CM | POA: Insufficient documentation

## 2016-07-14 DIAGNOSIS — Z8673 Personal history of transient ischemic attack (TIA), and cerebral infarction without residual deficits: Secondary | ICD-10-CM | POA: Insufficient documentation

## 2016-07-14 DIAGNOSIS — E119 Type 2 diabetes mellitus without complications: Secondary | ICD-10-CM | POA: Diagnosis not present

## 2016-07-14 DIAGNOSIS — Z8249 Family history of ischemic heart disease and other diseases of the circulatory system: Secondary | ICD-10-CM | POA: Diagnosis not present

## 2016-07-14 DIAGNOSIS — Z880 Allergy status to penicillin: Secondary | ICD-10-CM | POA: Diagnosis not present

## 2016-07-14 DIAGNOSIS — Z794 Long term (current) use of insulin: Secondary | ICD-10-CM | POA: Diagnosis not present

## 2016-07-14 DIAGNOSIS — Z7901 Long term (current) use of anticoagulants: Secondary | ICD-10-CM | POA: Insufficient documentation

## 2016-07-14 DIAGNOSIS — Z9989 Dependence on other enabling machines and devices: Secondary | ICD-10-CM | POA: Diagnosis not present

## 2016-07-14 DIAGNOSIS — I110 Hypertensive heart disease with heart failure: Secondary | ICD-10-CM | POA: Insufficient documentation

## 2016-07-14 DIAGNOSIS — Z833 Family history of diabetes mellitus: Secondary | ICD-10-CM | POA: Diagnosis not present

## 2016-07-14 DIAGNOSIS — J9620 Acute and chronic respiratory failure, unspecified whether with hypoxia or hypercapnia: Secondary | ICD-10-CM | POA: Insufficient documentation

## 2016-07-14 DIAGNOSIS — F17210 Nicotine dependence, cigarettes, uncomplicated: Secondary | ICD-10-CM | POA: Insufficient documentation

## 2016-07-14 DIAGNOSIS — D869 Sarcoidosis, unspecified: Secondary | ICD-10-CM | POA: Insufficient documentation

## 2016-07-14 LAB — COMPREHENSIVE METABOLIC PANEL
ALK PHOS: 59 U/L (ref 38–126)
ALT: 20 U/L (ref 17–63)
ANION GAP: 9 (ref 5–15)
AST: 21 U/L (ref 15–41)
Albumin: 4 g/dL (ref 3.5–5.0)
BUN: 16 mg/dL (ref 6–20)
CALCIUM: 8.8 mg/dL — AB (ref 8.9–10.3)
CO2: 28 mmol/L (ref 22–32)
Chloride: 102 mmol/L (ref 101–111)
Creatinine, Ser: 1.26 mg/dL — ABNORMAL HIGH (ref 0.61–1.24)
GFR calc non Af Amer: >60 mL/min (ref >60)
Glucose, Bld: 130 mg/dL — ABNORMAL HIGH (ref 65–99)
Potassium: 4.1 mmol/L (ref 3.5–5.1)
SODIUM: 139 mmol/L (ref 135–145)
Total Bilirubin: 0.7 mg/dL (ref 0.3–1.2)
Total Protein: 7.5 g/dL (ref 6.5–8.1)

## 2016-07-14 LAB — CBC WITH DIFFERENTIAL/PLATELET
Basophils Absolute: 0 10*3/uL (ref 0–0.1)
Basophils Relative: 0 %
EOS ABS: 0.1 10*3/uL (ref 0–0.7)
EOS PCT: 1 %
HCT: 46 % (ref 40.0–52.0)
HEMOGLOBIN: 15.2 g/dL (ref 13.0–18.0)
LYMPHS ABS: 0.4 10*3/uL — AB (ref 1.0–3.6)
Lymphocytes Relative: 3 %
MCH: 28 pg (ref 26.0–34.0)
MCHC: 33 g/dL (ref 32.0–36.0)
MCV: 84.9 fL (ref 80.0–100.0)
MONO ABS: 1.1 10*3/uL — AB (ref 0.2–1.0)
MONOS PCT: 9 %
Neutro Abs: 10.2 10*3/uL — ABNORMAL HIGH (ref 1.4–6.5)
Neutrophils Relative %: 87 %
PLATELETS: 208 10*3/uL (ref 150–440)
RBC: 5.42 MIL/uL (ref 4.40–5.90)
RDW: 14.4 % (ref 11.5–14.5)
WBC: 11.7 10*3/uL — ABNORMAL HIGH (ref 3.8–10.6)

## 2016-07-14 MED ORDER — ALBUTEROL SULFATE (2.5 MG/3ML) 0.083% IN NEBU
5.0000 mg | INHALATION_SOLUTION | Freq: Once | RESPIRATORY_TRACT | Status: AC
  Administered 2016-07-14: 5 mg via RESPIRATORY_TRACT
  Filled 2016-07-14: qty 6

## 2016-07-14 MED ORDER — VANCOMYCIN HCL IN DEXTROSE 1-5 GM/200ML-% IV SOLN
1000.0000 mg | Freq: Once | INTRAVENOUS | Status: AC
  Administered 2016-07-15: 1000 mg via INTRAVENOUS
  Filled 2016-07-14: qty 200

## 2016-07-14 MED ORDER — IPRATROPIUM-ALBUTEROL 0.5-2.5 (3) MG/3ML IN SOLN
3.0000 mL | Freq: Once | RESPIRATORY_TRACT | Status: AC
  Administered 2016-07-15: 3 mL via RESPIRATORY_TRACT
  Filled 2016-07-14: qty 3

## 2016-07-14 MED ORDER — METHYLPREDNISOLONE SODIUM SUCC 125 MG IJ SOLR
125.0000 mg | Freq: Once | INTRAMUSCULAR | Status: AC
  Administered 2016-07-15: 125 mg via INTRAVENOUS
  Filled 2016-07-14: qty 2

## 2016-07-14 MED ORDER — LEVOFLOXACIN IN D5W 750 MG/150ML IV SOLN
750.0000 mg | Freq: Once | INTRAVENOUS | Status: AC
  Administered 2016-07-15: 750 mg via INTRAVENOUS
  Filled 2016-07-14: qty 150

## 2016-07-14 MED ORDER — ACETAMINOPHEN 325 MG PO TABS
650.0000 mg | ORAL_TABLET | Freq: Once | ORAL | Status: AC
  Administered 2016-07-14: 650 mg via ORAL
  Filled 2016-07-14: qty 2

## 2016-07-14 MED ORDER — DEXTROSE 5 % IV SOLN
2.0000 g | Freq: Once | INTRAVENOUS | Status: AC
  Administered 2016-07-15: 2 g via INTRAVENOUS
  Filled 2016-07-14: qty 2

## 2016-07-14 NOTE — ED Notes (Signed)
Pt had large emesis during nebulizer treatment, charge nurse informed and room assigned.  Pt assisted to room by two EDT

## 2016-07-14 NOTE — ED Notes (Signed)
Pt was placed on tele monitoring and UA was sent to lab. RN has been notified

## 2016-07-14 NOTE — ED Notes (Signed)
Pt rhythm changed on monitor, repeat EKG was done. RN and MD notified

## 2016-07-14 NOTE — ED Provider Notes (Addendum)
[facility] Emergency Department Provider Note  Time seen: 11:40 PM  I have reviewed the triage vital signs and the nursing notes.   HISTORY  Chief Complaint Shortness of Breath and Generalized Body Aches    HPI [name] is a 46 y.o. male with a past medical history of CHF, COPD, diabetes, hypertension, hyperlipidemia, CVA, presents to the emergency department for difficulty breathing, cough, fever and generalized fatigue and weakness. According to the patient for the past week to 10 days he has been experiencing a cough occasionally productive of sputum. States over the past few hours tonight he has become very weak and fatigued feeling, states generalized body aches chills, found to be febrile in the emergency department.Patient denies abdominal pain, but states nausea and vomiting 2 in the ER. Denies diarrhea. Denies dysuria. Otherwise largely negative review of systems.  Past Medical History:  Diagnosis Date  . CHF (congestive heart failure) ([place])   . COPD (chronic obstructive pulmonary disease) ([place])   . Coronary artery disease   . Diabetes mellitus without complication ([place])   . Hypercholesteremia unk  . Hypertension   . Sarcoidosis   . Sleep apnea    does not use CPAP regularly.  . Stroke [facility])     Patient Active Problem List   Diagnosis Date Noted  . Chronic diastolic heart failure ([place]) 03/23/2016  . Hypercarbia   . COPD exacerbation ([place]) 11/17/2015  . HTN (hypertension) 06/14/2015  . Dental caries 03/08/2015  . Morbid obesity ([place]) 02/21/2015  . OSA and COPD overlap syndrome ([place]) 02/21/2015  . Thrombocytopenia ([place]) 02/21/2015  . Hypernatremia 02/21/2015  . Diabetes ([place]) 10/19/2014  . Chronic combined systolic and diastolic congestive heart failure ([place]) 10/18/2014  . Chronic obstructive pulmonary disease ([place]) 10/18/2014  . Sarcoidosis 04/22/2013  . Other and unspecified hyperlipidemia 04/22/2013    Past Surgical History:   Procedure Laterality Date  . APPENDECTOMY      Prior to Admission medications   Medication Sig Start Date End Date Taking? Authorizing Provider  albuterol (PROVENTIL) (2.5 MG/3ML) 0.083% nebulizer solution Take 2.5 mg by nebulization every 6 (six) hours as needed for wheezing or shortness of breath.   Yes [provider]  amLODipine (NORVASC) 5 MG tablet Take 5 mg by mouth daily. 03/12/16  Yes [provider]  apixaban (ELIQUIS) 5 MG TABS tablet Take 5 mg by mouth 2 (two) times daily. 06/11/16  Yes [provider]  aspirin 81 MG chewable tablet Chew 81 mg by mouth daily.   Yes [provider]  atorvastatin (LIPITOR) 40 MG tablet Take 40 mg by mouth at bedtime.    Yes [provider]  fluticasone (FLONASE) 50 MCG/ACT nasal spray Place 2 sprays into both nostrils daily. 07/03/16  Yes Vaughan Basta, MD  Fluticasone-Salmeterol (ADVAIR) 250-50 MCG/DOSE AEPB Inhale 1 puff into the lungs 2 (two) times daily.   Yes [provider]  furosemide (LASIX) 80 MG tablet Take 1 tablet (80 mg total) by mouth daily. 05/10/16  Yes Hackney, Otila Kluver A, FNP  insulin detemir (LEVEMIR) 100 UNIT/ML injection Inject 40-50 Units into the skin at bedtime.    Yes [provider]  insulin lispro (HUMALOG) 100 UNIT/ML injection Inject 12-15 Units into the skin 3 (three) times daily with meals. Sliding scale as needed   Yes [provider]  ipratropium-albuterol (DUONEB) 0.5-2.5 (3) MG/3ML SOLN Take 3 mLs by nebulization 4 (four) times daily. 02/21/15  Yes Theodoro Grist, MD  isosorbide dinitrate (ISORDIL) 10  MG tablet Take 1 tablet (10 mg total) by mouth 3 (three) times daily. 04/01/15  Yes Hackney, Otila Kluver A, FNP  lisinopril (PRINIVIL,ZESTRIL) 10 MG tablet Take 10 mg by mouth daily.   Yes [provider]  metFORMIN (GLUCOPHAGE-XR) 500 MG 24 hr tablet Take 500 mg by mouth daily with breakfast.    Yes [provider]  metoprolol tartrate  (LOPRESSOR) 50 MG tablet Take 1 tablet (50 mg total) by mouth 2 (two) times daily. 07/03/16  Yes Vaughan Basta, MD  potassium chloride SA (K-DUR,KLOR-CON) 20 MEQ tablet Take 1 tablet (20 mEq total) by mouth 2 (two) times daily. Patient taking differently: Take 20 mEq by mouth daily.  04/01/15  Yes Hackney, Tina A, FNP  theophylline (THEODUR) 300 MG 12 hr tablet Take 300 mg by mouth daily. 04/05/16  Yes [provider]  tiotropium (SPIRIVA) 18 MCG inhalation capsule Place 18 mcg into inhaler and inhale daily.   Yes [provider]    Allergies  Allergen Reactions  . Penicillins Anaphylaxis and Other (See Comments)    Has patient had a PCN reaction causing immediate rash, facial/tongue/throat swelling, SOB or lightheadedness with hypotension: Yes Has patient had a PCN reaction causing severe rash involving mucus membranes or skin necrosis: No Has patient had a PCN reaction that required hospitalization: No Has patient had a PCN reaction occurring within the last 10 years: No If all of the above answers are "NO", then may proceed with Cephalosporin use.     Family History  Problem Relation Age of Onset  . Hypertension Mother   . Heart disease Mother   . Diabetes Mother   . Cancer Father     Social History Social History  Substance Use Topics  . Smoking status: Current Every Day Smoker    Packs/day: 0.50    Years: 25.00    Types: Cigarettes    Last attempt to quit: 02/05/2015  . Smokeless tobacco: Never Used  . Alcohol use 0.0 oz/week     Comment: occassional    Review of Systems Constitutional: Positive for chills. Fever in the emergency department 100.0 ENT: Mild congestion Cardiovascular: Negative for chest pain. Respiratory: Moderate moderate shortness of breath with cough occasionally productive of sputum. Gastrointestinal: Negative for abdominal pain. Positive for nausea and vomiting 2. Negative for diarrhea. Genitourinary: Negative for  dysuria. Musculoskeletal: Positive for generalized body aches. Skin: Negative for rash. Neurological: Negative for headache All other ROS negative  ____________________________________________   PHYSICAL EXAM:  VITAL SIGNS: ED Triage Vitals  Enc Vitals Group     BP 07/14/16 2243 (!) 151/95     Pulse Rate 07/14/16 2243 (!) 124     Resp 07/14/16 2243 20     Temp 07/14/16 2243 100 F (37.8 C)     Temp Source 07/14/16 2243 Oral     SpO2 07/14/16 2243 93 %     Weight 07/14/16 2243 283 lb (128.4 kg)     Height 07/14/16 2243 6' (1.829 m)     Head Circumference --      Peak Flow --      Pain Score 07/14/16 2242 8     Pain Loc --      Pain Edu? --      Excl. in [place]? --     Constitutional: Alert and oriented. Well appearing and in no distress. Eyes: Normal exam ENT   Head: Normocephalic and atraumatic   Mouth/Throat: Mucous membranes are moist. Cardiovascular: Regular rhythm, rate around 120 bpm.  No obvious murmur. Respiratory: Mild tachypnea, moderate expiratory wheeze bilaterally, right greater than left. Gastrointestinal: Soft and nontender. No distention.   Musculoskeletal: Nontender with normal range of motion in all extremities. Mild lower extremity edema, equal bilaterally. Patient states this is normal for him. Neurologic:  Normal speech and language. No gross focal neurologic deficits Skin:  Skin is warm, dry and intact.  Psychiatric: Mood and affect are normal.  ____________________________________________    EKG  EKG reviewed and interpreted by myself shows sinus tachycardia 122 bpm with a widened QRS, left axis deviation, large and normal intervals, most consistent with right bundle branch block. Nonspecific ST changes without obvious ST elevation. Morphology of EKG largely unchanged from prior 07/02/16.  Repeat EKG reviewed and interpreted by myself shows sinus tachycardia 113 bpm, widened QRS, left axis deviation, large and normal intervals, nonspecific ST  changes. Largely unchanged from initial EKG.  ____________________________________________    RADIOLOGY  IMPRESSION: 1. Chronic changes related to underlying sarcoidosis, similar to previous. 2. Possible trace left pleural effusion. 3. No other active cardiopulmonary disease.  ____________________________________________   INITIAL IMPRESSION / ASSESSMENT AND PLAN / ED COURSE  Pertinent labs & imaging results that were available during my care of the patient were reviewed by me and considered in my medical decision making (see chart for details).  Patient presents to the emergency department for shortness breath, fatigue/generalized weakness, cough. Patient is a fever 100.0 in the emergency department tachycardic to 124. Patient has a room air saturation of 88-91%. Patient states he does wear 2 L of oxygen at home at night only does not require oxygen during the day. Patient has moderate bilateral expiratory wheezes we'll continue with breathing treatments and Solu-Medrol. Given the patient's fever and tachycardia we will cover with empiric anabiotic swallow waiting for the results.  Patient's results are largely normal slight white blood cell count elevation. Overall normal lactate. Chest x-ray shows chronic changes but no acute abnormality. Given the patient's tachycardia low-grade temperature and hypoxia we'll admit to the hospital for further treatment. Likely COPD exacerbation due to underlying upper respiratory infection.  ____________________________________________   FINAL CLINICAL IMPRESSION(S) / ED DIAGNOSES  Upper respiratory infection Fever Tachycardia COPD exacerbation    Harvest Dark, MD 07/15/16 4431    Harvest Dark, MD 07/15/16 [phone]

## 2016-07-14 NOTE — ED Triage Notes (Signed)
Pt to triage via [place], report sob and body aches x 1 day,  Pt w/ low grade temp in triage.  Pt with wheezes bilaterally upon auscultation.  Pt hx of COPD using albuterol inhaler w/o relief.

## 2016-07-15 ENCOUNTER — Encounter: Payer: Self-pay | Admitting: Internal Medicine

## 2016-07-15 ENCOUNTER — Emergency Department: Payer: Medicare Other

## 2016-07-15 DIAGNOSIS — J441 Chronic obstructive pulmonary disease with (acute) exacerbation: Secondary | ICD-10-CM | POA: Diagnosis not present

## 2016-07-15 LAB — URINALYSIS, ROUTINE W REFLEX MICROSCOPIC
Bacteria, UA: NONE SEEN
Bilirubin Urine: NEGATIVE
Glucose, UA: NEGATIVE mg/dL
Hgb urine dipstick: NEGATIVE
Ketones, ur: NEGATIVE mg/dL
Leukocytes, UA: NEGATIVE
NITRITE: NEGATIVE
PROTEIN: 30 mg/dL — AB
Specific Gravity, Urine: 1.023 (ref 1.005–1.030)
Squamous Epithelial / LPF: NONE SEEN
pH: 6 (ref 5.0–8.0)

## 2016-07-15 LAB — LACTIC ACID, PLASMA: Lactic Acid, Venous: 1.3 mmol/L (ref 0.5–1.9)

## 2016-07-15 LAB — BASIC METABOLIC PANEL
Anion gap: 6 (ref 5–15)
BUN: 17 mg/dL (ref 6–20)
CALCIUM: 8.5 mg/dL — AB (ref 8.9–10.3)
CO2: 30 mmol/L (ref 22–32)
CREATININE: 1.38 mg/dL — AB (ref 0.61–1.24)
Chloride: 101 mmol/L (ref 101–111)
GFR calc non Af Amer: 60 mL/min — ABNORMAL LOW (ref >60)
Glucose, Bld: 233 mg/dL — ABNORMAL HIGH (ref 65–99)
Potassium: 4.7 mmol/L (ref 3.5–5.1)
Sodium: 137 mmol/L (ref 135–145)

## 2016-07-15 LAB — PROCALCITONIN: Procalcitonin: 0.17 ng/mL

## 2016-07-15 LAB — GLUCOSE, CAPILLARY
GLUCOSE-CAPILLARY: 237 mg/dL — AB (ref 65–99)
GLUCOSE-CAPILLARY: 255 mg/dL — AB (ref 65–99)
GLUCOSE-CAPILLARY: 199 mg/dL — AB (ref 65–99)
Glucose-Capillary: 165 mg/dL — ABNORMAL HIGH (ref 65–99)

## 2016-07-15 LAB — CBC
HCT: 44.4 % (ref 40.0–52.0)
Hemoglobin: 14.7 g/dL (ref 13.0–18.0)
MCH: 28.5 pg (ref 26.0–34.0)
MCHC: 33.1 g/dL (ref 32.0–36.0)
MCV: 86.1 fL (ref 80.0–100.0)
PLATELETS: 206 10*3/uL (ref 150–440)
RBC: 5.16 MIL/uL (ref 4.40–5.90)
RDW: 14.2 % (ref 11.5–14.5)
WBC: 10.6 10*3/uL (ref 3.8–10.6)

## 2016-07-15 MED ORDER — METHYLPREDNISOLONE SODIUM SUCC 125 MG IJ SOLR: 125.0000 mg | Freq: Four times a day (QID) | INTRAMUSCULAR | Status: DC

## 2016-07-15 MED ORDER — THEOPHYLLINE ER 300 MG PO TB12
300.0000 mg | ORAL_TABLET | Freq: Every day | ORAL | Status: DC
  Administered 2016-07-15 – 2016-07-16 (×2): 300 mg via ORAL
  Filled 2016-07-15 (×2): qty 1

## 2016-07-15 MED ORDER — INSULIN ASPART 100 UNIT/ML ~~LOC~~ SOLN
0.0000 [IU] | Freq: Three times a day (TID) | SUBCUTANEOUS | Status: DC
  Administered 2016-07-15: 3 [IU] via SUBCUTANEOUS
  Administered 2016-07-15: 8 [IU] via SUBCUTANEOUS
  Administered 2016-07-15 – 2016-07-16 (×2): 5 [IU] via SUBCUTANEOUS
  Filled 2016-07-15: qty 8
  Filled 2016-07-15: qty 3
  Filled 2016-07-15 (×2): qty 5

## 2016-07-15 MED ORDER — ATORVASTATIN CALCIUM 20 MG PO TABS
40.0000 mg | ORAL_TABLET | Freq: Every day | ORAL | Status: DC
  Administered 2016-07-15: 40 mg via ORAL
  Filled 2016-07-15: qty 2

## 2016-07-15 MED ORDER — SODIUM CHLORIDE 0.9 % IV SOLN
INTRAVENOUS | Status: DC
  Administered 2016-07-15: 09:00:00 via INTRAVENOUS

## 2016-07-15 MED ORDER — INSULIN ASPART 100 UNIT/ML ~~LOC~~ SOLN: 0.0000 [IU] | Freq: Every day | SUBCUTANEOUS | Status: DC

## 2016-07-15 MED ORDER — ISOSORBIDE DINITRATE 10 MG PO TABS
10.0000 mg | ORAL_TABLET | Freq: Three times a day (TID) | ORAL | Status: DC
  Administered 2016-07-15 – 2016-07-16 (×4): 10 mg via ORAL
  Filled 2016-07-15 (×4): qty 1

## 2016-07-15 MED ORDER — SODIUM CHLORIDE 0.9 % IV SOLN: 250.0000 mL | INTRAVENOUS | Status: DC | PRN

## 2016-07-15 MED ORDER — METFORMIN HCL ER 500 MG PO TB24
500.0000 mg | ORAL_TABLET | Freq: Every day | ORAL | Status: DC
  Administered 2016-07-15 – 2016-07-16 (×2): 500 mg via ORAL
  Filled 2016-07-15 (×2): qty 1

## 2016-07-15 MED ORDER — ASPIRIN 81 MG PO CHEW
81.0000 mg | CHEWABLE_TABLET | Freq: Every day | ORAL | Status: DC
  Administered 2016-07-15 – 2016-07-16 (×2): 81 mg via ORAL
  Filled 2016-07-15 (×2): qty 1

## 2016-07-15 MED ORDER — INSULIN DETEMIR 100 UNIT/ML ~~LOC~~ SOLN
40.0000 [IU] | Freq: Every day | SUBCUTANEOUS | Status: DC
  Administered 2016-07-15: 40 [IU] via SUBCUTANEOUS
  Filled 2016-07-15 (×3): qty 0.4

## 2016-07-15 MED ORDER — APIXABAN 5 MG PO TABS
5.0000 mg | ORAL_TABLET | Freq: Two times a day (BID) | ORAL | Status: DC
  Administered 2016-07-15 – 2016-07-16 (×3): 5 mg via ORAL
  Filled 2016-07-15 (×3): qty 1

## 2016-07-15 MED ORDER — LISINOPRIL 10 MG PO TABS
10.0000 mg | ORAL_TABLET | Freq: Every day | ORAL | Status: DC
  Administered 2016-07-15 – 2016-07-16 (×2): 10 mg via ORAL
  Filled 2016-07-15 (×2): qty 1

## 2016-07-15 MED ORDER — LEVOFLOXACIN IN D5W 500 MG/100ML IV SOLN
500.0000 mg | INTRAVENOUS | Status: DC
  Administered 2016-07-15: 500 mg via INTRAVENOUS
  Filled 2016-07-15 (×3): qty 100

## 2016-07-15 MED ORDER — OXYCODONE-ACETAMINOPHEN 5-325 MG PO TABS
1.0000 | ORAL_TABLET | ORAL | Status: DC | PRN
  Administered 2016-07-15: 1 via ORAL
  Filled 2016-07-15: qty 1

## 2016-07-15 MED ORDER — SODIUM CHLORIDE 0.9% FLUSH
3.0000 mL | Freq: Two times a day (BID) | INTRAVENOUS | Status: DC
  Administered 2016-07-15 – 2016-07-16 (×2): 3 mL via INTRAVENOUS

## 2016-07-15 MED ORDER — SENNOSIDES-DOCUSATE SODIUM 8.6-50 MG PO TABS: 1.0000 | ORAL_TABLET | Freq: Every evening | ORAL | Status: DC | PRN

## 2016-07-15 MED ORDER — SODIUM CHLORIDE 0.9% FLUSH: 3.0000 mL | INTRAVENOUS | Status: DC | PRN

## 2016-07-15 MED ORDER — ALBUTEROL SULFATE (2.5 MG/3ML) 0.083% IN NEBU: 2.5000 mg | INHALATION_SOLUTION | RESPIRATORY_TRACT | Status: DC | PRN

## 2016-07-15 MED ORDER — METOPROLOL TARTRATE 50 MG PO TABS
100.0000 mg | ORAL_TABLET | Freq: Two times a day (BID) | ORAL | Status: DC
  Administered 2016-07-15 – 2016-07-16 (×3): 100 mg via ORAL
  Filled 2016-07-15 (×3): qty 2

## 2016-07-15 MED ORDER — AMLODIPINE BESYLATE 5 MG PO TABS
5.0000 mg | ORAL_TABLET | Freq: Every day | ORAL | Status: DC
  Administered 2016-07-15 – 2016-07-16 (×2): 5 mg via ORAL
  Filled 2016-07-15 (×2): qty 1

## 2016-07-15 MED ORDER — FLUTICASONE PROPIONATE 50 MCG/ACT NA SUSP
2.0000 | Freq: Every day | NASAL | Status: DC
  Administered 2016-07-15: 2 via NASAL
  Filled 2016-07-15: qty 16

## 2016-07-15 MED ORDER — ACETAMINOPHEN 325 MG PO TABS: 650.0000 mg | ORAL_TABLET | Freq: Four times a day (QID) | ORAL | Status: DC | PRN

## 2016-07-15 MED ORDER — ONDANSETRON HCL 4 MG/2ML IJ SOLN: 4.0000 mg | Freq: Four times a day (QID) | INTRAMUSCULAR | Status: DC | PRN

## 2016-07-15 MED ORDER — POTASSIUM CHLORIDE CRYS ER 20 MEQ PO TBCR
20.0000 meq | EXTENDED_RELEASE_TABLET | Freq: Two times a day (BID) | ORAL | Status: DC
  Administered 2016-07-15 – 2016-07-16 (×3): 20 meq via ORAL
  Filled 2016-07-15 (×3): qty 1

## 2016-07-15 MED ORDER — ACETAMINOPHEN 650 MG RE SUPP: 650.0000 mg | Freq: Four times a day (QID) | RECTAL | Status: DC | PRN

## 2016-07-15 MED ORDER — IPRATROPIUM-ALBUTEROL 0.5-2.5 (3) MG/3ML IN SOLN
3.0000 mL | Freq: Four times a day (QID) | RESPIRATORY_TRACT | Status: DC
  Administered 2016-07-15 – 2016-07-16 (×5): 3 mL via RESPIRATORY_TRACT
  Filled 2016-07-15 (×5): qty 3

## 2016-07-15 MED ORDER — METHYLPREDNISOLONE SODIUM SUCC 125 MG IJ SOLR
60.0000 mg | Freq: Four times a day (QID) | INTRAMUSCULAR | Status: DC
  Administered 2016-07-15 – 2016-07-16 (×5): 60 mg via INTRAVENOUS
  Filled 2016-07-15 (×5): qty 2

## 2016-07-15 MED ORDER — INSULIN DETEMIR 100 UNIT/ML ~~LOC~~ SOLN
40.0000 [IU] | Freq: Every day | SUBCUTANEOUS | Status: DC
  Filled 2016-07-15: qty 0.5

## 2016-07-15 MED ORDER — FUROSEMIDE 40 MG PO TABS
80.0000 mg | ORAL_TABLET | Freq: Every day | ORAL | Status: DC
  Administered 2016-07-15 – 2016-07-16 (×2): 80 mg via ORAL
  Filled 2016-07-15 (×2): qty 2

## 2016-07-15 MED ORDER — ONDANSETRON HCL 4 MG PO TABS: 4.0000 mg | ORAL_TABLET | Freq: Four times a day (QID) | ORAL | Status: DC | PRN

## 2016-07-15 NOTE — Progress Notes (Signed)
Admitted this morning for body aches, shortness of breath and found to have COPD exacerbation. Patient denies shortness of breath but complains of generalized body aches. Chest x-ray negative for pneumonia. Labs, medications are reviewed. Physical examination: Alert, awake, oriented. Cardiovascular system: S1, S2 regular. Lungs: Clear to auscultation but diminished air entry in both lungs without any wheezing.  #1 .COPD exacerbation: Clinically patient has no wheezing, decrease IV steroids, continue nebulizers, empiric antibiotics for acute bronchitis. 42 diabetes mellitus type 2 #3 of the sleep apnea, uses CPAP at nigh 4. chronic respiratory failure: Continue oxygen. Generalized body aches. Likely due to dehydration: Given  liter of fluid bolus. Try 1 day of Percocet. Time spent 35 minutes

## 2016-07-15 NOTE — Progress Notes (Signed)
Pharmacy Antibiotic Note  [name] is a 46 y.o. male admitted on 07/14/2016 with COPD exacerbation.  Pharmacy has been consulted for levaquin dosing.  Plan: Patient received levaquin 750 mg IV x 1 in the ED  Will start levaquin 500 mg IV daily for 5 days  Height: 6' (182.9 cm) Weight: 283 lb (128.4 kg) IBW/kg (Calculated) : 77.6  Temp (24hrs), Avg:100 F (37.8 C), Min:100 F (37.8 C), Max:100 F (37.8 C)   Recent Labs Lab 07/14/16 2255 07/14/16 2359  WBC 11.7*  --   CREATININE 1.26*  --   LATICACIDVEN  --  1.3    Estimated Creatinine Clearance: 101.4 mL/min (A) (by C-G formula based on SCr of 1.26 mg/dL (H)).    Allergies  Allergen Reactions  . Penicillins Anaphylaxis and Other (See Comments)    Has patient had a PCN reaction causing immediate rash, facial/tongue/throat swelling, SOB or lightheadedness with hypotension: Yes Has patient had a PCN reaction causing severe rash involving mucus membranes or skin necrosis: No Has patient had a PCN reaction that required hospitalization: No Has patient had a PCN reaction occurring within the last 10 years: No If all of the above answers are "NO", then may proceed with Cephalosporin use.     Thank you for allowing pharmacy to be a part of this patient's care.  Tobie Lords 07/15/2016 2:30 AM

## 2016-07-15 NOTE — H&P (Signed)
[facility] [place] NAME: [name]    MR#:  [id number]  DATE OF BIRTH:  08/18/1970  DATE OF ADMISSION:  07/14/2016  PRIMARY CARE PHYSICIAN: Soles, Howell Rucks, MD   REQUESTING/REFERRING PHYSICIAN:   CHIEF COMPLAINT:   Chief Complaint  Patient presents with  . Shortness of Breath  . Generalized Body Aches    HISTORY OF PRESENT ILLNESS: [name]  is a 46 y.o. male with a known history of Congestive heart failure, COPD on home oxygen, sarcoidosis, hyperlipidemia, hypertension, type 2 diabetes mellitus presented to the emergency room with the generalized body aches, shortness of breath and cough. Patient also had some fever along with generalized soreness. He uses oxygen at daytime and BiPAP at bedtime home. Patient was evaluated in the emergency room lactate level was normal. He was given nebulization treatments and antibiotic for COPD exacerbation. No history of orthopnea. No complaints of chest pain. No recent travel. No evidence of any pneumonia on chest x-ray. Patient was also given IV Solu-Medrol in the emergency room.  PAST MEDICAL HISTORY:   Past Medical History:  Diagnosis Date  . CHF (congestive heart failure) ([place])   . COPD (chronic obstructive pulmonary disease) ([place])   . Coronary artery disease   . Diabetes mellitus without complication ([place])   . Hypercholesteremia unk  . Hypertension   . Sarcoidosis   . Sleep apnea    does not use CPAP regularly.  . Stroke [facility])     PAST SURGICAL HISTORY: Past Surgical History:  Procedure Laterality Date  . APPENDECTOMY      SOCIAL HISTORY:  Social History  Substance Use Topics  . Smoking status: Current Every Day Smoker    Packs/day: 0.50    Years: 25.00    Types: Cigarettes    Last attempt to quit: 02/05/2015  . Smokeless tobacco: Never Used  . Alcohol use 0.0 oz/week     Comment: occassional    FAMILY HISTORY:  Family History  Problem Relation Age of  Onset  . Hypertension Mother   . Heart disease Mother   . Diabetes Mother   . Cancer Father     DRUG ALLERGIES:  Allergies  Allergen Reactions  . Penicillins Anaphylaxis and Other (See Comments)    Has patient had a PCN reaction causing immediate rash, facial/tongue/throat swelling, SOB or lightheadedness with hypotension: Yes Has patient had a PCN reaction causing severe rash involving mucus membranes or skin necrosis: No Has patient had a PCN reaction that required hospitalization: No Has patient had a PCN reaction occurring within the last 10 years: No If all of the above answers are "NO", then may proceed with Cephalosporin use.     REVIEW OF SYSTEMS:   CONSTITUTIONAL: Has fever, fatigue and weakness.  EYES: No blurred or double vision.  EARS, NOSE, AND THROAT: No tinnitus or ear pain.  RESPIRATORY: Has cough, shortness of breath, wheezing  No hemoptysis.  CARDIOVASCULAR: No chest pain, orthopnea, edema.  GASTROINTESTINAL: No nausea, vomiting, diarrhea or abdominal pain.  GENITOURINARY: No dysuria, hematuria.  ENDOCRINE: No polyuria, nocturia,  HEMATOLOGY: No anemia, easy bruising or bleeding SKIN: No rash or lesion. MUSCULOSKELETAL: No joint pain or arthritis.   NEUROLOGIC: has tingling left foot,  No numbness, weakness.  PSYCHIATRY: No anxiety or depression.   MEDICATIONS AT HOME:  Prior to Admission medications   Medication Sig Start Date End Date Taking? Authorizing Provider  albuterol (PROVENTIL) (2.5 MG/3ML) 0.083% nebulizer solution Take 2.5  mg by nebulization every 6 (six) hours as needed for wheezing or shortness of breath.   Yes [provider]  amLODipine (NORVASC) 5 MG tablet Take 5 mg by mouth daily. 03/12/16  Yes [provider]  apixaban (ELIQUIS) 5 MG TABS tablet Take 5 mg by mouth 2 (two) times daily. 06/11/16  Yes [provider]  aspirin 81 MG chewable tablet Chew 81 mg by mouth daily.   Yes [provider]   atorvastatin (LIPITOR) 40 MG tablet Take 40 mg by mouth at bedtime.    Yes [provider]  fluticasone (FLONASE) 50 MCG/ACT nasal spray Place 2 sprays into both nostrils daily. 07/03/16  Yes Vaughan Basta, MD  Fluticasone-Salmeterol (ADVAIR) 250-50 MCG/DOSE AEPB Inhale 1 puff into the lungs 2 (two) times daily.   Yes [provider]  furosemide (LASIX) 80 MG tablet Take 1 tablet (80 mg total) by mouth daily. 05/10/16  Yes Hackney, Otila Kluver A, FNP  insulin detemir (LEVEMIR) 100 UNIT/ML injection Inject 40-50 Units into the skin at bedtime.    Yes [provider]  insulin lispro (HUMALOG) 100 UNIT/ML injection Inject 12-15 Units into the skin 3 (three) times daily with meals. Sliding scale as needed   Yes [provider]  ipratropium-albuterol (DUONEB) 0.5-2.5 (3) MG/3ML SOLN Take 3 mLs by nebulization 4 (four) times daily. 02/21/15  Yes Theodoro Grist, MD  isosorbide dinitrate (ISORDIL) 10 MG tablet Take 1 tablet (10 mg total) by mouth 3 (three) times daily. 04/01/15  Yes Hackney, Otila Kluver A, FNP  lisinopril (PRINIVIL,ZESTRIL) 10 MG tablet Take 10 mg by mouth daily.   Yes [provider]  metFORMIN (GLUCOPHAGE-XR) 500 MG 24 hr tablet Take 500 mg by mouth daily with breakfast.    Yes [provider]  metoprolol tartrate (LOPRESSOR) 100 MG tablet Take 1 tablet by mouth 2 (two) times daily. 07/03/16  Yes [provider]  potassium chloride SA (K-DUR,KLOR-CON) 20 MEQ tablet Take 1 tablet (20 mEq total) by mouth 2 (two) times daily. Patient taking differently: Take 20 mEq by mouth daily.  04/01/15  Yes Hackney, Tina A, FNP  predniSONE (STERAPRED UNI-PAK 21 TAB) 10 MG (21) TBPK tablet Take 1 tablet by mouth as directed. 07/03/16  Yes [provider]  theophylline (THEODUR) 300 MG 12 hr tablet Take 300 mg by mouth daily. 04/05/16  Yes [provider]  tiotropium (SPIRIVA) 18 MCG inhalation capsule Place 18 mcg into inhaler and inhale  daily.   Yes [provider]      PHYSICAL EXAMINATION:   VITAL SIGNS: Blood pressure (!) 152/91, pulse (!) 109, temperature 100 F (37.8 C), temperature source Oral, resp. rate (!) 24, height 6' (1.829 m), weight 128.4 kg (283 lb), SpO2 96 %.  GENERAL:  46 y.o.-year-old patient lying in the bed with no acute distress.  EYES: Pupils equal, round, reactive to light and accommodation. No scleral icterus. Extraocular muscles intact.  HEENT: Head atraumatic, normocephalic. Oropharynx and nasopharynx clear.  NECK:  Supple, no jugular venous distention. No thyroid enlargement, no tenderness.  LUNGS: Decreased breath sounds bilaterally, bilateral  wheezing noted, no rales,rhonchi or crepitation. No use of accessory muscles of respiration.  CARDIOVASCULAR: S1, S2 tachycardia noted. No murmurs, rubs, or gallops.  ABDOMEN: Soft, nontender, nondistended. Bowel sounds present. No organomegaly or mass.  EXTREMITIES: No pedal edema, cyanosis, or clubbing.  NEUROLOGIC: Cranial nerves II through XII are intact. Muscle strength 5/5 in all extremities. Sensation intact. Gait not checked.  PSYCHIATRIC: The patient is  alert and oriented x 3.  SKIN: No obvious rash, lesion, or ulcer.   LABORATORY PANEL:   CBC  Recent Labs Lab 07/14/16 2255  WBC 11.7*  HGB 15.2  HCT 46.0  PLT 208  MCV 84.9  MCH 28.0  MCHC 33.0  RDW 14.4  LYMPHSABS 0.4*  MONOABS 1.1*  EOSABS 0.1  BASOSABS 0.0   ------------------------------------------------------------------------------------------------------------------  Chemistries   Recent Labs Lab 07/14/16 2255  NA 139  K 4.1  CL 102  CO2 28  GLUCOSE 130*  BUN 16  CREATININE 1.26*  CALCIUM 8.8*  AST 21  ALT 20  ALKPHOS 59  BILITOT 0.7   ------------------------------------------------------------------------------------------------------------------ estimated creatinine clearance is 101.4 mL/min (A) (by C-G formula based on SCr of 1.26 mg/dL  (H)). ------------------------------------------------------------------------------------------------------------------ No results for input(s): TSH, T4TOTAL, T3FREE, THYROIDAB in the last 72 hours.  Invalid input(s): FREET3   Coagulation profile No results for input(s): INR, PROTIME in the last 168 hours. ------------------------------------------------------------------------------------------------------------------- No results for input(s): DDIMER in the last 72 hours. -------------------------------------------------------------------------------------------------------------------  Cardiac Enzymes No results for input(s): CKMB, TROPONINI, MYOGLOBIN in the last 168 hours.  Invalid input(s): CK ------------------------------------------------------------------------------------------------------------------ Invalid input(s): POCBNP  ---------------------------------------------------------------------------------------------------------------  Urinalysis    Component Value Date/Time   COLORURINE YELLOW (A) 07/14/2016 2319   APPEARANCEUR CLEAR (A) 07/14/2016 2319   APPEARANCEUR Hazy 06/01/2013 1139   LABSPEC 1.023 07/14/2016 2319   LABSPEC 1.030 06/01/2013 1139   PHURINE 6.0 07/14/2016 2319   GLUCOSEU NEGATIVE 07/14/2016 2319   GLUCOSEU Negative 06/01/2013 1139   HGBUR NEGATIVE 07/14/2016 2319   BILIRUBINUR NEGATIVE 07/14/2016 2319   BILIRUBINUR Negative 06/01/2013 [place] 07/14/2016 2319   PROTEINUR 30 (A) 07/14/2016 2319   NITRITE NEGATIVE 07/14/2016 2319   LEUKOCYTESUR NEGATIVE 07/14/2016 2319   LEUKOCYTESUR Negative 06/01/2013 1139     RADIOLOGY: Dg Chest 2 View  Result Date: 07/15/2016 CLINICAL DATA:  Initial evaluation for acute shortness of breath. EXAM: CHEST  2 VIEW COMPARISON:  Prior radiograph and CT from 07/01/2016. FINDINGS: Cardiac and mediastinal silhouettes are stable in size and contour, and remain within normal limits. The Lungs  are hypoinflated with elevation left hemidiaphragm, stable. Chronic changes related to underlying sarcoidosis noted with perihilar architectural distortion and scarring. No superimposed focal infiltrates. No pulmonary edema. Possible trace left pleural effusion. No pneumothorax. No acute osseus abnormality. IMPRESSION: 1. Chronic changes related to underlying sarcoidosis, similar to previous. 2. Possible trace left pleural effusion. 3. No other active cardiopulmonary disease. Electronically Signed   By: Jeannine Boga M.D.   On: 07/15/2016 01:05    EKG: Orders placed or performed during the hospital encounter of 07/14/16  . ED EKG  . ED EKG  . EKG 12-Lead  . EKG 12-Lead  . ED EKG 12-Lead  . ED EKG 12-Lead  . EKG 12-Lead  . EKG 12-Lead    IMPRESSION AND PLAN: 46 year old male patient with history of COPD on home oxygen, sarcoidosis, type 2 diabetes mellitus, congestive heart failure, hypertension, hyperlipidemia presented to the emergency room with shortness of breath, cough and wheezing. Admitting diagnosis 1. Acute COPD situation 2. Acute on chronic respiratory failure 3. Sarcoidosis 4. Diabetes mellitus type 2 5. Hypertension Treatment plan Mid patient to medical floor Oxygen via nasal cannula CPAP at bedtime IV Solu-Medrol 125 every 6 hourly IV Levaquin antibiotic 500 MG daily Resume diabetic medication Monitor blood sugars Nebulization treatment around-the-clock CODE STATUS full code  All the records are reviewed and case discussed with ED provider. Management plans discussed with  the patient, family and they are in agreement.  CODE STATUS:FULL CODE Code Status History    Date Active Date Inactive Code Status Order ID Comments User Context   07/01/2016  5:01 PM 07/03/2016  6:53 PM Full Code [id number]  Henreitta Leber, MD Inpatient   03/26/2016 12:43 AM 03/28/2016  8:01 PM Full Code [id number]  Hugelmeyer, Ubaldo Glassing, DO Inpatient   11/17/2015 11:31 AM 11/18/2015  6:45 PM  Full Code [id number]  Bettey Costa, MD Inpatient   02/11/2015  2:17 PM 02/21/2015  6:41 PM Full Code [id number]  Henreitta Leber, MD Inpatient   12/28/2014  9:34 PM 12/30/2014  4:02 PM Full Code [id number]  Fritzi Mandes, MD Inpatient   08/24/2014  2:45 AM 08/26/2014  6:19 PM Full Code [id number]  Hower, Aaron Mose, MD ED   08/14/2014 10:31 AM 08/18/2014  6:30 PM Full Code [id number]  Vaughan Basta, MD Inpatient    Advance Directive Documentation     Most Recent Value  Type of Advance Directive  Healthcare Power of Attorney, Living will  Pre-existing out of facility DNR order (yellow form or pink MOST form)  -  "MOST" Form in Place?  -       TOTAL TIME TAKING CARE OF THIS PATIENT: 66minutes.    Saundra Shelling M.D on 07/15/2016 at 2:38 AM  Between 7am to 6pm - Pager - [phone]  After 6pm go to [email or web address] - password [email or web address] [facility]  Office  [phone]  CC: Primary care physician; Herminio Commons, MD

## 2016-07-15 NOTE — ED Notes (Signed)
Pt assisted to use the urinal while standing at bedside;

## 2016-07-16 DIAGNOSIS — J441 Chronic obstructive pulmonary disease with (acute) exacerbation: Secondary | ICD-10-CM | POA: Diagnosis not present

## 2016-07-16 DIAGNOSIS — J449 Chronic obstructive pulmonary disease, unspecified: Secondary | ICD-10-CM | POA: Diagnosis present

## 2016-07-16 LAB — URINE CULTURE: CULTURE: NO GROWTH

## 2016-07-16 LAB — GLUCOSE, CAPILLARY: GLUCOSE-CAPILLARY: 202 mg/dL — AB (ref 65–99)

## 2016-07-16 MED ORDER — LEVOFLOXACIN 500 MG PO TABS: 500.0000 mg | ORAL_TABLET | Freq: Every day | ORAL | 0 refills | Status: AC

## 2016-07-16 MED ORDER — PREDNISONE 10 MG (21) PO TBPK: ORAL_TABLET | ORAL | 0 refills | Status: DC

## 2016-07-16 NOTE — Care Management Note (Signed)
Case Management Note  Patient Details  Name: [name] MRN: [id number] Date of Birth: 1970-09-06  Subjective/Objective:                  Patient discharging to home today. He states he lives with wife. Patient drives; has transportation. He [facility]. No anticipated needs per patient however he requests work excuse for his wife; MD paged. MD declined to write note for his wife for work.  Action/Plan: No RNCM needs. Case closed.  Expected Discharge Date:  07/16/16               Expected Discharge Plan:     In-House Referral:     Discharge planning Services  CM Consult  Post Acute Care Choice:  Durable Medical Equipment, Home Health Choice offered to:  Patient  DME Arranged:    DME Agency:     HH Arranged:    [place] Agency:     Status of Service:     If discussed at [employer], dates discussed:    Additional Comments:  Marshell Garfinkel, RN 07/16/2016, 8:17 AM

## 2016-07-16 NOTE — Progress Notes (Signed)
Conversation with patient regarding discharge with home O2 tank. Patient stated he would not ask mother in law to bring O2 tank from his car to room for discharge, "he is a grown a--man." Education given on policy and concern with non acceptance. Plan will be to escort patient to personal vehicle with our tank and observance tank in vehicle.

## 2016-07-16 NOTE — Discharge Summary (Signed)
[name], is a 46 y.o. male  DOB March 22, 1970  MRN [id number].  Admission date:  07/14/2016  Admitting Physician  Saundra Shelling, MD  Discharge Date:  07/16/2016   Primary MD  Gwynneth Aliment, Howell Rucks, MD  Recommendations for primary care physician for things to follow:   Follow-up with PCP in 1 week   Admission Diagnosis  COPD exacerbation ([place]) [J44.1] Upper respiratory tract infection, unspecified type [J06.9]   Discharge Diagnosis  COPD exacerbation ([place]) [J44.1] Upper respiratory tract infection, unspecified type [J06.9]    Active Problems:   COPD exacerbation ([place])      Past Medical History:  Diagnosis Date  . CHF (congestive heart failure) ([place])   . COPD (chronic obstructive pulmonary disease) ([place])   . Coronary artery disease   . Diabetes mellitus without complication ([place])   . Hypercholesteremia unk  . Hypertension   . Sarcoidosis   . Sleep apnea    does not use CPAP regularly.  . Stroke [facility])     Past Surgical History:  Procedure Laterality Date  . APPENDECTOMY         History of present illness and  Hospital Course:     Kindly see H&P for history of present illness and admission details, please review complete Labs, Consult reports and Test reports for all details in brief  HPI  from the history and physical done on the day of admission  . 46 year old male patient with history of diastolic heart failurel COPD on oxygen, hyperlipidemia, diabetes mellitus type 2 history of previous strokes came in because of shortness of breath, generalized weakness.  Hospital Course  #1. Acute COPD exacerbation: Started on steroids, antibiotics, nebulizers, patient also received IV hydration, clinically he is better today, stable for discharge .   #2 history of diabetes mellitus type 2: Continue home  medicines #3. Hypertension: Continue home medicine History of for sleep apnea: Continue CPAP at night, patient has chronic oxygen needed 2 L of oxygen all the time.  History of previous stroke: Patient is on aspirin, statins, and apixiban.   Discharge Condition:stable   Follow UP  Follow-up Information    Soles, Howell Rucks, MD On 07/23/2016.   Specialty:  Family Medicine Why:  @ 6:40 PM Contact information: [address] [place] [address] [phone]             Discharge Instructions  and  Discharge Medications     Allergies as of 07/16/2016      Reactions   Penicillins Anaphylaxis, Other (See Comments)   Has patient had a PCN reaction causing immediate rash, facial/tongue/throat swelling, SOB or lightheadedness with hypotension: Yes Has patient had a PCN reaction causing severe rash involving mucus membranes or skin necrosis: No Has patient had a PCN reaction that required hospitalization: No Has patient had a PCN reaction occurring within the last 10 years: No If all of the above answers are "NO", then may proceed with Cephalosporin use.      Medication List    TAKE these medications   albuterol (2.5 MG/3ML) 0.083% nebulizer solution Commonly known as:  PROVENTIL Take 2.5 mg by nebulization every 6 (six) hours as needed for wheezing or shortness of breath.   amLODipine 5 MG tablet Commonly known as:  NORVASC Take 5 mg by mouth daily.   apixaban 5 MG Tabs tablet Commonly known as:  ELIQUIS Take 5 mg by mouth 2 (two) times daily.   aspirin 81 MG chewable tablet Chew 81 mg  by mouth daily.   atorvastatin 40 MG tablet Commonly known as:  LIPITOR Take 40 mg by mouth at bedtime.   fluticasone 50 MCG/ACT nasal spray Commonly known as:  FLONASE Place 2 sprays into both nostrils daily.   Fluticasone-Salmeterol 250-50 MCG/DOSE Aepb Commonly known as:  ADVAIR Inhale 1 puff into the lungs 2 (two) times daily.   furosemide 80 MG  tablet Commonly known as:  LASIX Take 1 tablet (80 mg total) by mouth daily.   insulin detemir 100 UNIT/ML injection Commonly known as:  LEVEMIR Inject 40-50 Units into the skin at bedtime.   insulin lispro 100 UNIT/ML injection Commonly known as:  HUMALOG Inject 12-15 Units into the skin 3 (three) times daily with meals. Sliding scale as needed   ipratropium-albuterol 0.5-2.5 (3) MG/3ML Soln Commonly known as:  DUONEB Take 3 mLs by nebulization 4 (four) times daily.   isosorbide dinitrate 10 MG tablet Commonly known as:  ISORDIL Take 1 tablet (10 mg total) by mouth 3 (three) times daily.   levofloxacin 500 MG tablet Commonly known as:  LEVAQUIN Take 1 tablet (500 mg total) by mouth daily.   lisinopril 10 MG tablet Commonly known as:  PRINIVIL,ZESTRIL Take 10 mg by mouth daily.   metFORMIN 500 MG 24 hr tablet Commonly known as:  GLUCOPHAGE-XR Take 500 mg by mouth daily with breakfast.   metoprolol tartrate 100 MG tablet Commonly known as:  LOPRESSOR Take 1 tablet by mouth 2 (two) times daily.   potassium chloride SA 20 MEQ tablet Commonly known as:  K-DUR,KLOR-CON Take 1 tablet (20 mEq total) by mouth 2 (two) times daily. What changed:  when to take this   predniSONE 10 MG (21) Tbpk tablet Commonly known as:  STERAPRED UNI-PAK 21 TAB Take one tab daily What changed:  how much to take  how to take this  when to take this  additional instructions   theophylline 300 MG 12 hr tablet Commonly known as:  THEODUR Take 300 mg by mouth daily.   tiotropium 18 MCG inhalation capsule Commonly known as:  SPIRIVA Place 18 mcg into inhaler and inhale daily.         Diet and Activity recommendation: See Discharge Instructions above   Consults obtained - none   Major procedures and Radiology Reports - PLEASE review detailed and final reports for all details, in brief -    Dg Chest 2 View  Result Date: 07/15/2016 CLINICAL DATA:  Initial evaluation for acute  shortness of breath. EXAM: CHEST  2 VIEW COMPARISON:  Prior radiograph and CT from 07/01/2016. FINDINGS: Cardiac and mediastinal silhouettes are stable in size and contour, and remain within normal limits. The Lungs are hypoinflated with elevation left hemidiaphragm, stable. Chronic changes related to underlying sarcoidosis noted with perihilar architectural distortion and scarring. No superimposed focal infiltrates. No pulmonary edema. Possible trace left pleural effusion. No pneumothorax. No acute osseus abnormality. IMPRESSION: 1. Chronic changes related to underlying sarcoidosis, similar to previous. 2. Possible trace left pleural effusion. 3. No other active cardiopulmonary disease. Electronically Signed   By: Jeannine Boga M.D.   On: 07/15/2016 01:05   Dg Chest 2 View  Result Date: 07/01/2016 CLINICAL DATA:  46 year old male with history of sarcoidosis. Shortness of breath. Low oxygen saturations on room air. Cough for the past 2 days. Mild fever. EXAM: CHEST  2 VIEW COMPARISON:  Chest x-ray 03/25/2016. FINDINGS: Again noted are extensive areas of architectural distortion throughout both lungs, which appear to be chronic and similar  to prior studies, presumably related to parenchymal lung disease in the setting of known sarcoidosis. No definite new acute consolidative airspace disease. Elevation of the left hemidiaphragm with chronic blunting of left costophrenic sulcus, which is presumably related to chronic scarring, similar to prior studies. No definite pleural effusions. No evidence of pulmonary edema. Heart size is normal. Upper mediastinal contours are again distorted related to architectural distortion in the perihilar regions, but appear similar to prior studies. IMPRESSION: 1. Chronic changes in the thorax related to underlying sarcoidosis, similar to prior examinations. No radiographic evidence of acute cardiopulmonary disease. Electronically Signed   By: Vinnie Langton M.D.   On:  07/01/2016 12:27   Ct Angio Chest Pe W Or Wo Contrast  Result Date: 07/01/2016 CLINICAL DATA:  Cough and shortness of breath. History of sarcoidosis. EXAM: CT ANGIOGRAPHY CHEST WITH CONTRAST TECHNIQUE: Multidetector CT imaging of the chest was performed using the standard protocol during bolus administration of intravenous contrast. Multiplanar CT image reconstructions and MIPs were obtained to evaluate the vascular anatomy. CONTRAST:  75 mL of Isovue 370 COMPARISON:  Chest x-ray from today and chest CT February 15, 2015. FINDINGS: Cardiovascular: The heart is unchanged. The thoracic aorta is non aneurysmal with no dissection. No pulmonary emboli are identified. Mediastinum/Nodes: No enlarged mediastinal, hilar, or axillary lymph nodes. Thyroid gland, trachea, and esophagus demonstrate no significant findings. Lungs/Pleura: Central airways are stable. Distortion in the lungs consistent with known sarcoidosis are primarily chronic. Associated emphysematous changes identified. There are patchy nodular opacities primarily in the lung bases which are new when compared to August 15, 2014 and February 15, 2015 suggesting an acute on chronic process. No new large infiltrates. No masses. Upper Abdomen: No acute abnormality. Musculoskeletal: No chest wall abnormality. No acute or significant osseous findings. Review of the MIP images confirms the above findings. IMPRESSION: 1. There are chronic changes and architectural distortion in the lungs consistent with known sarcoidosis. Patchy nodular opacities in the bases are new since previous studies suggesting an acute on chronic infectious or inflammatory process. Recommend follow-up to resolution. 2. No pulmonary emboli. Electronically Signed   By: Dorise Bullion III M.D   On: 07/01/2016 14:53    Micro Results    Recent Results (from the past 240 hour(s))  Urine culture     Status: None   Collection Time: 07/14/16 11:19 PM  Result Value Ref Range Status   Specimen  Description URINE, RANDOM  Final   Special Requests NONE  Final   Culture   Final    NO GROWTH Performed at [facility], [address] [place], [place] [address]    Report Status 07/16/2016 FINAL  Final  Blood Culture (routine x 2)     Status: None (Preliminary result)   Collection Time: 07/14/16 11:59 PM  Result Value Ref Range Status   Specimen Description BLOOD RIGHT ANTECUBITAL  Final   Special Requests   Final    BOTTLES DRAWN AEROBIC AND ANAEROBIC Blood Culture adequate volume   Culture NO GROWTH 1 DAY  Final   Report Status PENDING  Incomplete  Blood Culture (routine x 2)     Status: None (Preliminary result)   Collection Time: 07/14/16 11:59 PM  Result Value Ref Range Status   Specimen Description BLOOD LEFT ANTECUBITAL  Final   Special Requests   Final    BOTTLES DRAWN AEROBIC AND ANAEROBIC Blood Culture adequate volume   Culture NO GROWTH 1 DAY  Final   Report Status PENDING  Incomplete       Today   Subjective:   [name] today No shortness of breath or generalized weakness. Stable for discharge  Objective:   Blood pressure 118/65, pulse 81, temperature 97.8 F (36.6 C), temperature source Oral, resp. rate 19, height 6' (1.829 m), weight 129.6 kg (285 lb 11.2 oz), SpO2 92 %.   Intake/Output Summary (Last 24 hours) at 07/16/16 0901 Last data filed at 07/16/16 0740  Gross per 24 hour  Intake          2118.33 ml  Output             2000 ml  Net           118.33 ml    Exam Awake Alert, Oriented x 3, No new F.N deficits, Normal affect [place].AT,PERRAL Supple Neck,No JVD, No cervical lymphadenopathy appriciated.  Symmetrical Chest wall movement, Good air movement bilaterally, CTAB RRR,No Gallops,Rubs or new Murmurs, No Parasternal Heave +ve B.Sounds, Abd Soft, Non tender, No organomegaly appriciated, No rebound -guarding or rigidity. No Cyanosis, Clubbing or edema, No new Rash or bruise  Data Review   CBC w Diff: Lab Results  Component Value  Date   WBC 10.6 07/15/2016   HGB 14.7 07/15/2016   HGB 15.1 05/28/2014   HCT 44.4 07/15/2016   HCT 45.2 05/28/2014   PLT 206 07/15/2016   PLT 185 05/28/2014   LYMPHOPCT 3 07/14/2016   LYMPHOPCT 18.6 05/28/2014   MONOPCT 9 07/14/2016   MONOPCT 15.8 05/28/2014   EOSPCT 1 07/14/2016   EOSPCT 1.3 05/28/2014   BASOPCT 0 07/14/2016   BASOPCT 0.3 05/28/2014    CMP: Lab Results  Component Value Date   NA 137 07/15/2016   NA 140 05/28/2014   K 4.7 07/15/2016   K 3.9 05/28/2014   CL 101 07/15/2016   CL 105 05/28/2014   CO2 30 07/15/2016   CO2 30 05/28/2014   BUN 17 07/15/2016   BUN 12 05/28/2014   CREATININE 1.38 (H) 07/15/2016   CREATININE 1.20 05/28/2014   PROT 7.5 07/14/2016   PROT 8.4 (H) 06/01/2013   ALBUMIN 4.0 07/14/2016   ALBUMIN 3.8 06/01/2013   BILITOT 0.7 07/14/2016   BILITOT 0.7 06/01/2013   ALKPHOS 59 07/14/2016   ALKPHOS 88 06/01/2013   AST 21 07/14/2016   AST 24 06/01/2013   ALT 20 07/14/2016   ALT 54 06/01/2013  .   Total Time in preparing paper work, data evaluation and todays exam - 33 minutes  Iniko Robles M.D on 07/16/2016 at 9:01 AM    Note: This dictation was prepared with Dragon dictation along with smaller phrase technology. Any transcriptional errors that result from this process are unintentional.

## 2016-07-16 NOTE — Care Management Obs Status (Signed)
[place] NOTIFICATION   Patient Details  Name: [name] MRN: [id number] Date of Birth: 1971-02-05   Medicare Observation Status Notification Given:  Yes    Marshell Garfinkel, RN 07/16/2016, 9:11 AM

## 2016-07-16 NOTE — Progress Notes (Signed)
Inpatient Diabetes Program Recommendations  AACE/ADA: New Consensus Statement on Inpatient Glycemic Control (2015)  Target Ranges:  Prepandial:   less than 140 mg/dL      Peak postprandial:   less than 180 mg/dL (1-2 hours)      Critically ill patients:  140 - 180 mg/dL  Results for [name], [name] (MRN [id number]) as of 07/16/2016 12:04  Ref. Range 07/15/2016 07:28 07/15/2016 11:13 07/15/2016 16:52 07/15/2016 20:37 07/16/2016 07:48  Glucose-Capillary Latest Ref Range: 65 - 99 mg/dL 237 (H) 255 (H) 165 (H) 199 (H) 202 (H)   Review of Glycemic Control  Diabetes history: DM2 Outpatient Diabetes medications: Levemir 40-50 units QHS, Humalog 12-15 units TID with meals, Metformin XR 500 mg QAM Current orders for Inpatient glycemic control: Levemir 40 units QHS, Novolog 0-15 units TID with meals, Novolog 0-5 units QHS, Metformin XR 500 mg QAM  Inpatient Diabetes Program Recommendations: Insulin - Basal: Fasting glucose 202 mg/dl this morning. If steroids are continued as ordered, please consider increasing Levemir to 43 units QHS. Insulin - Meal Coverage: If steroids are continued, please consider ordering Novolog 3 units TID with meals for meal coverage if patient eats at least 50% of meals.  NOTE: Patient is ordered Solumedrol 60 mg Q6H which is contributing to hyperglycemia.  Thanks, Barnie Alderman, RN, MSN, CDE Diabetes Coordinator Inpatient Diabetes Program [phone] (Team Pager from 8am to 5pm)

## 2016-07-16 NOTE — Progress Notes (Signed)
Escorted patient to personal vehicle outside ED entrance via wc and O2 tank. Observed patient with personal O2 tank and attach [place] to 2L. Reviewed discharge summary, answered all questions. VSS at this time.

## 2016-07-18 ENCOUNTER — Ambulatory Visit: Payer: Medicare Other | Attending: Family | Admitting: Family

## 2016-07-18 ENCOUNTER — Encounter: Payer: Self-pay | Admitting: Family

## 2016-07-18 VITALS — BP 122/75 | HR 95 | Resp 20 | Ht 72.0 in | Wt 289.5 lb

## 2016-07-18 DIAGNOSIS — Z7982 Long term (current) use of aspirin: Secondary | ICD-10-CM | POA: Diagnosis not present

## 2016-07-18 DIAGNOSIS — D869 Sarcoidosis, unspecified: Secondary | ICD-10-CM | POA: Insufficient documentation

## 2016-07-18 DIAGNOSIS — E7800 Pure hypercholesterolemia, unspecified: Secondary | ICD-10-CM | POA: Diagnosis not present

## 2016-07-18 DIAGNOSIS — F17210 Nicotine dependence, cigarettes, uncomplicated: Secondary | ICD-10-CM | POA: Diagnosis not present

## 2016-07-18 DIAGNOSIS — E119 Type 2 diabetes mellitus without complications: Secondary | ICD-10-CM | POA: Diagnosis not present

## 2016-07-18 DIAGNOSIS — J449 Chronic obstructive pulmonary disease, unspecified: Secondary | ICD-10-CM | POA: Insufficient documentation

## 2016-07-18 DIAGNOSIS — J410 Simple chronic bronchitis: Secondary | ICD-10-CM

## 2016-07-18 DIAGNOSIS — G4733 Obstructive sleep apnea (adult) (pediatric): Secondary | ICD-10-CM | POA: Diagnosis not present

## 2016-07-18 DIAGNOSIS — Z880 Allergy status to penicillin: Secondary | ICD-10-CM | POA: Diagnosis not present

## 2016-07-18 DIAGNOSIS — Z79899 Other long term (current) drug therapy: Secondary | ICD-10-CM | POA: Insufficient documentation

## 2016-07-18 DIAGNOSIS — Z7901 Long term (current) use of anticoagulants: Secondary | ICD-10-CM | POA: Diagnosis not present

## 2016-07-18 DIAGNOSIS — Z794 Long term (current) use of insulin: Secondary | ICD-10-CM | POA: Diagnosis not present

## 2016-07-18 DIAGNOSIS — I5032 Chronic diastolic (congestive) heart failure: Secondary | ICD-10-CM | POA: Insufficient documentation

## 2016-07-18 DIAGNOSIS — I10 Essential (primary) hypertension: Secondary | ICD-10-CM

## 2016-07-18 DIAGNOSIS — I2510 Atherosclerotic heart disease of native coronary artery without angina pectoris: Secondary | ICD-10-CM | POA: Insufficient documentation

## 2016-07-18 DIAGNOSIS — I110 Hypertensive heart disease with heart failure: Secondary | ICD-10-CM | POA: Insufficient documentation

## 2016-07-18 LAB — GLUCOSE, CAPILLARY: Glucose-Capillary: 219 mg/dL — ABNORMAL HIGH (ref 65–99)

## 2016-07-18 NOTE — Patient Instructions (Signed)
Continue weighing daily and call for an overnight weight gain of > 2 pounds or a weekly weight gain of >5 pounds. 

## 2016-07-18 NOTE — Progress Notes (Signed)
Patient ID: MARY HOCKEY, male    DOB: [birth year], 46 y.o.   MRN: [id number]  HPI  Mr Brobeck is a 46 y/o male with a history of CVA, obstructive sleep apnea, HTN, sarcoidosis, hyperlipidemia, DM, CAD, COPD, remote tobacco use and chronic heart failure.  Last echo was done 02/15/15 and showed an EF of 60% with increased wall thickness. Mild MR along with mild TR. This is stable from a previous echo done on 08/15/14.  Was last admitted on 07/14/16 for COPD exacerbation. Treated with steroids, nebulizer's, and antibiotics. Admitted on 07/01/16 for COPD exacerbation. Treated with IV steroids, duonebs, and antibiotics. Admitted on 03/25/16 for COPD exacerbation. ated with IV steroids that was changed to oral and antibiotics. Admitted on 11/17/15 with COPD exacerbation. Was placed on bipap due to elevated CO2 levels with good response. Was given IV steroids and discharged with PO steroids and antibiotics. Admission prior to this was on 02/11/15 with a COPD exacerbation.   He presents today for a follow-up visit with shortness of breath with moderate exertion and a cough. Has some mild leg swelling. Weighing daily with no weight fluctuations. Has been following a diet but is not low sodium.   Past Medical History:  Diagnosis Date  . CHF (congestive heart failure) ([place])   . COPD (chronic obstructive pulmonary disease) ([place])   . Coronary artery disease   . Diabetes mellitus without complication ([place])   . Hypercholesteremia unk  . Hypertension   . Sarcoidosis   . Sleep apnea    does not use CPAP regularly.  . Stroke [facility])    Past Surgical History:  Procedure Laterality Date  . APPENDECTOMY     Family History  Problem Relation Age of Onset  . Hypertension Mother   . Heart disease Mother   . Diabetes Mother   . Cancer Father    Social History  Substance Use Topics  . Smoking status: Current Every Day Smoker    Packs/day: 0.50    Years: 25.00    Types: Cigarettes    Last attempt to quit:  02/05/2015  . Smokeless tobacco: Never Used  . Alcohol use 0.0 oz/week     Comment: occassional   Allergies  Allergen Reactions  . Penicillins Anaphylaxis and Other (See Comments)    Has patient had a PCN reaction causing immediate rash, facial/tongue/throat swelling, SOB or lightheadedness with hypotension: Yes Has patient had a PCN reaction causing severe rash involving mucus membranes or skin necrosis: No Has patient had a PCN reaction that required hospitalization: No Has patient had a PCN reaction occurring within the last 10 years: No If all of the above answers are "NO", then may proceed with Cephalosporin use.    Prior to Admission medications   Medication Sig Start Date End Date Taking? Authorizing Provider  amLODipine (NORVASC) 5 MG tablet Take 5 mg by mouth daily. 03/12/16  Yes [provider]  apixaban (ELIQUIS) 5 MG TABS tablet Take 5 mg by mouth 2 (two) times daily. 06/11/16  Yes [provider]  aspirin 81 MG chewable tablet Chew 81 mg by mouth daily.   Yes [provider]  atorvastatin (LIPITOR) 40 MG tablet Take 40 mg by mouth at bedtime.    Yes [provider]  fluticasone (FLONASE) 50 MCG/ACT nasal spray Place 2 sprays into both nostrils daily. 07/03/16  Yes Vaughan Basta, MD  Fluticasone-Salmeterol (ADVAIR) 250-50 MCG/DOSE AEPB Inhale 1 puff into the lungs 2 (two) times daily.   Yes [provider]  furosemide (LASIX) 80 MG tablet Take 1 tablet (80 mg total) by mouth daily. 05/10/16  Yes Hackney, Otila Kluver A, FNP  insulin detemir (LEVEMIR) 100 UNIT/ML injection Inject 40-50 Units into the skin at bedtime.    Yes [provider]  insulin lispro (HUMALOG) 100 UNIT/ML injection Inject 12-15 Units into the skin 3 (three) times daily with meals. Sliding scale as needed   Yes [provider]  ipratropium-albuterol (DUONEB) 0.5-2.5 (3) MG/3ML SOLN Take 3 mLs by nebulization 4 (four) times daily. 02/21/15  Yes Theodoro Grist, MD  isosorbide dinitrate (ISORDIL) 10 MG tablet Take 1 tablet (10 mg total) by mouth 3 (three) times daily. 04/01/15  Yes Hackney, Otila Kluver A, FNP  levofloxacin (LEVAQUIN) 500 MG tablet Take 1 tablet (500 mg total) by mouth daily. 07/16/16 07/21/16 Yes Epifanio Lesches, MD  lisinopril (PRINIVIL,ZESTRIL) 10 MG tablet Take 10 mg by mouth daily.   Yes [provider]  metFORMIN (GLUCOPHAGE-XR) 500 MG 24 hr tablet Take 500 mg by mouth 2 (two) times daily.    Yes [provider]  metoprolol tartrate (LOPRESSOR) 100 MG tablet Take 50 mg by mouth 2 (two) times daily.  07/03/16  Yes [provider]  potassium chloride SA (K-DUR,KLOR-CON) 20 MEQ tablet Take 1 tablet (20 mEq total) by mouth 2 (two) times daily. Patient taking differently: Take 20 mEq by mouth daily.  04/01/15  Yes Hackney, Otila Kluver A, FNP  predniSONE (STERAPRED UNI-PAK 21 TAB) 10 MG (21) TBPK tablet Take one tab daily 07/16/16  Yes Epifanio Lesches, MD  theophylline (THEODUR) 300 MG 12 hr tablet Take 300 mg by mouth daily. 04/05/16  Yes [provider]  tiotropium (SPIRIVA) 18 MCG inhalation capsule Place 18 mcg into inhaler and inhale daily.   Yes [provider]  albuterol (PROVENTIL) (2.5 MG/3ML) 0.083% nebulizer solution Take 2.5 mg by nebulization every 6 (six) hours as needed for wheezing or shortness of breath.    [provider]    Review of Systems  Constitutional: Negative for appetite change and fatigue.  HENT: Negative for congestion, postnasal drip and sore throat.   Eyes: Negative.   Respiratory: Positive for cough (clear mucous) and shortness of breath (infrequent). Negative for chest tightness and wheezing.   Cardiovascular: Positive for leg swelling. Negative for chest pain and palpitations.  Gastrointestinal: Negative for abdominal distention and abdominal pain.  Endocrine: Negative.   Genitourinary: Negative.   Musculoskeletal: Negative for back pain and neck pain.   Skin: Negative.   Allergic/Immunologic: Negative.   Neurological: Negative for dizziness and light-headedness.  Hematological: Negative for adenopathy. Does not bruise/bleed easily.  Psychiatric/Behavioral: Negative for dysphoric mood and sleep disturbance (wearing bipap; ). The patient is not nervous/anxious.    Vitals:   07/18/16 1215  BP: 122/75  Pulse: 95  Resp: 20  SpO2: 95%  Weight: 289 lb 8 oz (131.3 kg)  Height: 6' (1.829 m)   Wt Readings from Last 3 Encounters:  07/18/16 289 lb 8 oz (131.3 kg)  07/15/16 285 lb 11.2 oz (129.6 kg)  07/01/16 280 lb (127 kg)    Lab Results  Component Value Date   CREATININE 1.38 (H) 07/15/2016   CREATININE 1.26 (H) 07/14/2016   CREATININE 1.73 (H) 07/03/2016    Physical Exam  Constitutional: He is oriented to person, place, and time. He appears well-developed and well-nourished.  HENT:  Head: Normocephalic and atraumatic.  Neck: Normal range of motion. Neck supple. No JVD present.  Cardiovascular: Normal rate  and regular rhythm.   Pulmonary/Chest: Effort normal. He has no wheezes. He has no rales.  Abdominal: Soft. He exhibits no distension. There is no tenderness.  Musculoskeletal: He exhibits edema (trace edema in bilateral lower legs). He exhibits no tenderness.  Neurological: He is alert and oriented to person, place, and time.  Skin: Skin is warm and dry.  Psychiatric: He has a normal mood and affect. His behavior is normal. Thought content normal.  Nursing note and vitals reviewed.   Assessment & Plan:  1: Chronic heart failure with preserved ejection fraction- - NYHA class II - euvolemic today - weight down 7 pounds from November 2017. Reminded to call for an overnight weight gain of >2 pounds or a weekly weight gain of >5 pounds - trying to do some cardio and weights 3-4 times a week  - drinking about 4-6 bottles of water daily. Recently says he was dehydrated. Informed to watch fluid intake - cooking with salt but  not adding to food. Advised to follow a 2000 mg sodium diet  - needs a follow up echo at next cardiologist appointment - sees cardiologist ([place]) in August  2: HTN- - BP looks good today  - sees PCP ([place]) next week   3: COPD-  - using bipap at night - using Duonebs as needed and not taking albuterol nebulizer - on prednisone and levofloxacin - sees pulmonologist [name]) today  - last PFT's done 09/20/15  4: Diabetes- - nonfasting blood glucose 219 in clinic; had breakfast bacon, eggs, biscuit, and grapefruit juice around 6:30-7:00AM today - reported blood glucose of 90-150 - currently taking prednisone which can elevate blood sugar - last A1c 6.8% on 03/26/16 - will need a follow up A1c  - continue levemir, humalog, and metformin  Patient did not bring his medications nor a list. Each medication was verbally reviewed with the patient and he was encouraged to bring the bottles to every visit to confirm accuracy of list.  Return in 3 months or sooner for any questions/problems before then.   Loree Fee, PharmD 07/18/2016 12:19 PM

## 2016-07-19 ENCOUNTER — Ambulatory Visit: Payer: Medicare Other | Admitting: Family

## 2016-07-20 LAB — CULTURE, BLOOD (ROUTINE X 2)
CULTURE: NO GROWTH
Culture: NO GROWTH
SPECIAL REQUESTS: ADEQUATE
Special Requests: ADEQUATE

## 2016-07-22 ENCOUNTER — Emergency Department: Payer: Medicare Other

## 2016-07-22 ENCOUNTER — Emergency Department
Admission: EM | Admit: 2016-07-22 | Discharge: 2016-07-22 | Disposition: A | Discharge status: Home / Self Care | Payer: Medicare Other | Attending: Emergency Medicine | Admitting: Emergency Medicine

## 2016-07-22 DIAGNOSIS — Y929 Unspecified place or not applicable: Secondary | ICD-10-CM | POA: Insufficient documentation

## 2016-07-22 DIAGNOSIS — J449 Chronic obstructive pulmonary disease, unspecified: Secondary | ICD-10-CM | POA: Insufficient documentation

## 2016-07-22 DIAGNOSIS — F17210 Nicotine dependence, cigarettes, uncomplicated: Secondary | ICD-10-CM | POA: Diagnosis not present

## 2016-07-22 DIAGNOSIS — M549 Dorsalgia, unspecified: Secondary | ICD-10-CM | POA: Diagnosis present

## 2016-07-22 DIAGNOSIS — M62830 Muscle spasm of back: Secondary | ICD-10-CM

## 2016-07-22 DIAGNOSIS — X58XXXA Exposure to other specified factors, initial encounter: Secondary | ICD-10-CM | POA: Insufficient documentation

## 2016-07-22 DIAGNOSIS — Z7984 Long term (current) use of oral hypoglycemic drugs: Secondary | ICD-10-CM | POA: Insufficient documentation

## 2016-07-22 DIAGNOSIS — Y999 Unspecified external cause status: Secondary | ICD-10-CM | POA: Diagnosis not present

## 2016-07-22 DIAGNOSIS — I110 Hypertensive heart disease with heart failure: Secondary | ICD-10-CM | POA: Diagnosis not present

## 2016-07-22 DIAGNOSIS — S39012A Strain of muscle, fascia and tendon of lower back, initial encounter: Secondary | ICD-10-CM | POA: Diagnosis not present

## 2016-07-22 DIAGNOSIS — E119 Type 2 diabetes mellitus without complications: Secondary | ICD-10-CM | POA: Diagnosis not present

## 2016-07-22 DIAGNOSIS — I5042 Chronic combined systolic (congestive) and diastolic (congestive) heart failure: Secondary | ICD-10-CM | POA: Insufficient documentation

## 2016-07-22 DIAGNOSIS — Y939 Activity, unspecified: Secondary | ICD-10-CM | POA: Insufficient documentation

## 2016-07-22 DIAGNOSIS — I2510 Atherosclerotic heart disease of native coronary artery without angina pectoris: Secondary | ICD-10-CM | POA: Insufficient documentation

## 2016-07-22 LAB — URINALYSIS, COMPLETE (UACMP) WITH MICROSCOPIC
Bacteria, UA: NONE SEEN
Bilirubin Urine: NEGATIVE
GLUCOSE, UA: NEGATIVE mg/dL
HGB URINE DIPSTICK: NEGATIVE
KETONES UR: NEGATIVE mg/dL
Leukocytes, UA: NEGATIVE
NITRITE: NEGATIVE
PH: 6 (ref 5.0–8.0)
Protein, ur: NEGATIVE mg/dL
RBC / HPF: NONE SEEN RBC/hpf (ref 0–5)
Specific Gravity, Urine: 1.019 (ref 1.005–1.030)
Squamous Epithelial / LPF: NONE SEEN
WBC, UA: NONE SEEN WBC/hpf (ref 0–5)

## 2016-07-22 MED ORDER — OXYCODONE-ACETAMINOPHEN 5-325 MG PO TABS
2.0000 | ORAL_TABLET | Freq: Once | ORAL | Status: AC
  Administered 2016-07-22: 2 via ORAL
  Filled 2016-07-22: qty 2

## 2016-07-22 MED ORDER — HYDROCODONE-ACETAMINOPHEN 5-325 MG PO TABS: 1.0000 | ORAL_TABLET | ORAL | 0 refills | Status: DC | PRN

## 2016-07-22 MED ORDER — METHOCARBAMOL 500 MG PO TABS: ORAL_TABLET | ORAL | 0 refills | Status: DC

## 2016-07-22 NOTE — ED Triage Notes (Signed)
Pt reports to ED w. C.o back pain since Thursday. Pt denies injury, urinary s/s. Pt A/OX4. NAD.

## 2016-07-22 NOTE — Discharge Instructions (Signed)
Use ice or heat to your  back as needed for comfort. Continue taking medication as directed. Take Robaxin as directed for muscle spasms. Take Norco as needed for pain. Follow-up with your primary care doctor if any continued problems.

## 2016-07-22 NOTE — ED Provider Notes (Signed)
[facility] Emergency Department Provider Note  ____________________________________________   First MD Initiated Contact with Patient 07/22/16 1433     (approximate)  I have reviewed the triage vital signs and the nursing notes.   HISTORY  Chief Complaint Back Pain    HPI [name] is a 46 y.o. male is here with complaint of back pain. Patient denies any recent injury. He denies any past history of back pain. He states that left side is more painful than right and pain is increased with range of motion.Patient denies any urinary symptoms or history of kidney stone. He is unaware of any recent injury to his back. He denies any paresthesias to his lower extremities, incontinence of bowel or bladder, or saddle anesthesias. He rates his pain as an 8 out of 10.   Past Medical History:  Diagnosis Date  . CHF (congestive heart failure) ([place])   . COPD (chronic obstructive pulmonary disease) ([place])   . Coronary artery disease   . Diabetes mellitus without complication ([place])   . Hypercholesteremia unk  . Hypertension   . Sarcoidosis   . Sleep apnea    does not use CPAP regularly.  . Stroke [facility])     Patient Active Problem List   Diagnosis Date Noted  . COPD (chronic obstructive pulmonary disease) ([place]) 07/16/2016  . Chronic diastolic heart failure ([place]) 03/23/2016  . Hypercarbia   . COPD exacerbation ([place]) 11/17/2015  . HTN (hypertension) 06/14/2015  . Dental caries 03/08/2015  . Morbid obesity ([place]) 02/21/2015  . OSA and COPD overlap syndrome ([place]) 02/21/2015  . Thrombocytopenia ([place]) 02/21/2015  . Hypernatremia 02/21/2015  . Diabetes ([place]) 10/19/2014  . Chronic combined systolic and diastolic congestive heart failure ([place]) 10/18/2014  . Chronic obstructive pulmonary disease ([place]) 10/18/2014  . Sarcoidosis 04/22/2013  . Other and unspecified hyperlipidemia 04/22/2013    Past Surgical History:  Procedure Laterality Date  . APPENDECTOMY       Prior to Admission medications   Medication Sig Start Date End Date Taking? Authorizing Provider  albuterol (PROVENTIL) (2.5 MG/3ML) 0.083% nebulizer solution Take 2.5 mg by nebulization every 6 (six) hours as needed for wheezing or shortness of breath.    [provider]  amLODipine (NORVASC) 5 MG tablet Take 5 mg by mouth daily. 03/12/16   [provider]  apixaban (ELIQUIS) 5 MG TABS tablet Take 5 mg by mouth 2 (two) times daily. 06/11/16   [provider]  aspirin 81 MG chewable tablet Chew 81 mg by mouth daily.    [provider]  atorvastatin (LIPITOR) 40 MG tablet Take 40 mg by mouth at bedtime.     [provider]  fluticasone (FLONASE) 50 MCG/ACT nasal spray Place 2 sprays into both nostrils daily. 07/03/16   Vaughan Basta, MD  Fluticasone-Salmeterol (ADVAIR) 250-50 MCG/DOSE AEPB Inhale 1 puff into the lungs 2 (two) times daily.    [provider]  furosemide (LASIX) 80 MG tablet Take 1 tablet (80 mg total) by mouth daily. 05/10/16   Alisa Graff, FNP  HYDROcodone-acetaminophen (NORCO/VICODIN) 5-325 MG tablet Take 1 tablet by mouth every 4 (four) hours as needed for moderate pain. 07/22/16   Johnn Hai, PA-C  insulin detemir (LEVEMIR) 100 UNIT/ML injection Inject 40-50 Units into the skin at bedtime.     [provider]  insulin lispro (HUMALOG) 100 UNIT/ML injection Inject 12-15 Units into the skin 3 (three) times daily with meals. Sliding scale as needed  [provider]  ipratropium-albuterol (DUONEB) 0.5-2.5 (3) MG/3ML SOLN Take 3 mLs by nebulization 4 (four) times daily. 02/21/15   Theodoro Grist, MD  isosorbide dinitrate (ISORDIL) 10 MG tablet Take 1 tablet (10 mg total) by mouth 3 (three) times daily. 04/01/15   Alisa Graff, FNP  lisinopril (PRINIVIL,ZESTRIL) 10 MG tablet Take 10 mg by mouth daily.    [provider]  metFORMIN (GLUCOPHAGE-XR) 500 MG 24 hr tablet Take 500 mg by mouth  2 (two) times daily.     [provider]  methocarbamol (ROBAXIN) 500 MG tablet 1-2 tablets qid prn muscle spasms 07/22/16   Letitia Neri L, PA-C  metoprolol tartrate (LOPRESSOR) 100 MG tablet Take 50 mg by mouth 2 (two) times daily.  07/03/16   [provider]  potassium chloride SA (K-DUR,KLOR-CON) 20 MEQ tablet Take 1 tablet (20 mEq total) by mouth 2 (two) times daily. Patient taking differently: Take 20 mEq by mouth daily.  04/01/15   Alisa Graff, FNP  predniSONE (STERAPRED UNI-PAK 21 TAB) 10 MG (21) TBPK tablet Take one tab daily 07/16/16   Epifanio Lesches, MD  theophylline (THEODUR) 300 MG 12 hr tablet Take 300 mg by mouth daily. 04/05/16   [provider]  tiotropium (SPIRIVA) 18 MCG inhalation capsule Place 18 mcg into inhaler and inhale daily.    [provider]    Allergies Penicillins  Family History  Problem Relation Age of Onset  . Hypertension Mother   . Heart disease Mother   . Diabetes Mother   . Cancer Father     Social History Social History  Substance Use Topics  . Smoking status: Current Every Day Smoker    Packs/day: 0.50    Years: 25.00    Types: Cigarettes    Last attempt to quit: 02/05/2015  . Smokeless tobacco: Never Used  . Alcohol use 0.0 oz/week     Comment: occassional    Review of Systems  Constitutional: No fever/chills Cardiovascular: Denies chest pain. Respiratory: Denies shortness of breath. Gastrointestinal: No abdominal pain.  No nausea, no vomiting.  Genitourinary: Negative for dysuria. Musculoskeletal: Positive for back pain. Skin: Negative for rash. Neurological: Negative for headaches, focal weakness or numbness.   ____________________________________________   PHYSICAL EXAM:  VITAL SIGNS: ED Triage Vitals  Enc Vitals Group     BP 07/22/16 1344 102/62     Pulse Rate 07/22/16 1344 (!) 102     Resp 07/22/16 1344 16     Temp 07/22/16 1344 98.5 F (36.9 C)     Temp Source 07/22/16  1344 Oral     SpO2 07/22/16 1344 94 %     Weight --      Height --      Head Circumference --      Peak Flow --      Pain Score 07/22/16 1347 8     Pain Loc --      Pain Edu? --      Excl. in [place]? --     Constitutional: Alert and oriented. Well appearing and in no acute distress. Eyes: Conjunctivae are normal. PERRL. EOMI. Head: Atraumatic. Nose: No congestion/rhinnorhea. Neck: No stridor.   Cardiovascular: Normal rate, regular rhythm. Grossly normal heart sounds.  Good peripheral circulation. Respiratory: Normal respiratory effort.  No retractions. Lungs CTAB. Gastrointestinal: Soft and nontender.Obese. No CVA tenderness. Musculoskeletal: On examination of the back there is no point tenderness on palpation of the thoracic or lumbar spine. On palpation of the vertebral  muscles there is no tenderness on palpation on the left than there is on the right. There is no soft tissue swelling present. There is no discoloration. Range of motion is decreased secondary to discomfort. No active muscle spasms were seen. Neurologic:  Normal speech and language. No gross focal neurologic deficits are appreciated.  Skin:  Skin is warm, dry and intact. No rash noted. Psychiatric: Mood and affect are normal. Speech and behavior are normal.  ____________________________________________   LABS (all labs ordered are listed, but only abnormal results are displayed)  Labs Reviewed  URINALYSIS, COMPLETE (UACMP) WITH MICROSCOPIC - Abnormal; Notable for the following:       Result Value   Color, Urine YELLOW (*)    APPearance CLEAR (*)    All other components within normal limits     RADIOLOGY  Dg Thoracic Spine 2 View  Result Date: 07/22/2016 CLINICAL DATA:  Mid back pain for several days.  No reported injury. EXAM: THORACIC SPINE 2 VIEWS COMPARISON:  07/01/2016 chest CT FINDINGS: There is no evidence of thoracic spine fracture. Alignment is normal. No other significant bone abnormalities are  identified. Stable scarring in the medial right lower lung. IMPRESSION: No thoracic spine fracture or subluxation. Electronically Signed   By: Ilona Sorrel M.D.   On: 07/22/2016 15:40   Dg Lumbar Spine 2-3 Views  Result Date: 07/22/2016 CLINICAL DATA:  Mid back pain and low back pain for 3 days. No reported injury. EXAM: LUMBAR SPINE - 2-3 VIEW COMPARISON:  05/11/2013 CT abdomen/pelvis . FINDINGS: This report assumes 5 non rib-bearing lumbar vertebrae. Lumbar vertebral body heights are preserved, with no fracture. Mild spondylosis deformans without significant loss of lumbar disc height. Minimal 2 mm anterolisthesis at L4-5. No appreciable facet arthropathy. No aggressive appearing focal osseous lesions. IMPRESSION: 1. No lumbar spine fracture. 2. Mild lumbar spondylosis deformans . 3. Minimal 2 mm anterolisthesis at L4-5. Electronically Signed   By: Ilona Sorrel M.D.   On: 07/22/2016 15:45    ____________________________________________   PROCEDURES  Procedure(s) performed: None  Procedures  Critical Care performed: No  ____________________________________________   INITIAL IMPRESSION / ASSESSMENT AND PLAN / ED COURSE  Pertinent labs & imaging results that were available during my care of the patient were reviewed by me and considered in my medical decision making (see chart for details).  Prior to discharge patient states that pain medication has decreased his pain. He also is somewhat drowsy from the medication. Patient was discharged on Norco one every 4 hours as needed for pain and Robaxin 500 mg 2 tablets 4 times a day for muscle spasms. He is encouraged to use ice or heat to his back and follow-up with his PCP if any continued problems.      ____________________________________________   FINAL CLINICAL IMPRESSION(S) / ED DIAGNOSES  Final diagnoses:  Strain of lumbar region, initial encounter  Muscle spasm of back      NEW MEDICATIONS STARTED DURING THIS  VISIT:  Discharge Medication List as of 07/22/2016  4:03 PM    START taking these medications   Details  HYDROcodone-acetaminophen (NORCO/VICODIN) 5-325 MG tablet Take 1 tablet by mouth every 4 (four) hours as needed for moderate pain., Starting Sun 07/22/2016, Print    methocarbamol (ROBAXIN) 500 MG tablet 1-2 tablets qid prn muscle spasms, Print         Note:  This document was prepared using Dragon voice recognition software and may include unintentional dictation errors.    Johnn Hai, PA-C  07/22/16 1653    Delman Kitten, MD 07/27/16 1710

## 2016-08-20 ENCOUNTER — Other Ambulatory Visit: Payer: Self-pay | Admitting: Family

## 2016-10-18 ENCOUNTER — Ambulatory Visit: Payer: Medicare Other | Admitting: Family

## 2017-10-24 ENCOUNTER — Emergency Department
Admission: EM | Admit: 2017-10-24 | Discharge: 2017-10-24 | Disposition: A | Discharge status: Home / Self Care | Payer: Medicare Other | Attending: Emergency Medicine | Admitting: Emergency Medicine

## 2017-10-24 ENCOUNTER — Other Ambulatory Visit: Payer: Self-pay

## 2017-10-24 DIAGNOSIS — E119 Type 2 diabetes mellitus without complications: Secondary | ICD-10-CM | POA: Insufficient documentation

## 2017-10-24 DIAGNOSIS — F17210 Nicotine dependence, cigarettes, uncomplicated: Secondary | ICD-10-CM | POA: Diagnosis not present

## 2017-10-24 DIAGNOSIS — Z7982 Long term (current) use of aspirin: Secondary | ICD-10-CM | POA: Insufficient documentation

## 2017-10-24 DIAGNOSIS — I110 Hypertensive heart disease with heart failure: Secondary | ICD-10-CM | POA: Insufficient documentation

## 2017-10-24 DIAGNOSIS — Z7901 Long term (current) use of anticoagulants: Secondary | ICD-10-CM | POA: Insufficient documentation

## 2017-10-24 DIAGNOSIS — M5441 Lumbago with sciatica, right side: Secondary | ICD-10-CM | POA: Diagnosis not present

## 2017-10-24 DIAGNOSIS — Z794 Long term (current) use of insulin: Secondary | ICD-10-CM | POA: Diagnosis not present

## 2017-10-24 DIAGNOSIS — I5042 Chronic combined systolic (congestive) and diastolic (congestive) heart failure: Secondary | ICD-10-CM | POA: Insufficient documentation

## 2017-10-24 DIAGNOSIS — M25551 Pain in right hip: Secondary | ICD-10-CM | POA: Diagnosis present

## 2017-10-24 DIAGNOSIS — J449 Chronic obstructive pulmonary disease, unspecified: Secondary | ICD-10-CM | POA: Insufficient documentation

## 2017-10-24 MED ORDER — TRAMADOL HCL 50 MG PO TABS: 50.0000 mg | ORAL_TABLET | Freq: Four times a day (QID) | ORAL | 0 refills | Status: DC | PRN

## 2017-10-24 MED ORDER — CYCLOBENZAPRINE HCL 10 MG PO TABS: 10.0000 mg | ORAL_TABLET | Freq: Three times a day (TID) | ORAL | 0 refills | Status: DC | PRN

## 2017-10-24 MED ORDER — PREDNISONE 10 MG (21) PO TBPK: ORAL_TABLET | ORAL | 0 refills | Status: DC

## 2017-10-24 NOTE — Discharge Instructions (Signed)
Follow up with the primary care provider for symptoms that are not improving over the week.  Return to the ER for symptoms that change or worsen if unable to schedule an appointment.

## 2017-10-24 NOTE — ED Provider Notes (Signed)
[facility] Emergency Department Provider Note ____________________________________________  Time seen: Approximately 11:15 AM  I have reviewed the triage vital signs and the nursing notes.   HISTORY  Chief Complaint No chief complaint on file.    HPI [name] who presents to the emergency department for evaluation and treatment of right hip and buttock pain x 1 week. No injury. No previous similar symptoms. No relief with tylenol.  Past Medical History:  Diagnosis Date  . CHF (congestive heart failure) ([place])   . COPD (chronic obstructive pulmonary disease) ([place])   . Coronary artery disease   . Diabetes mellitus without complication ([place])   . Hypercholesteremia unk  . Hypertension   . Sarcoidosis   . Sleep apnea    does not use CPAP regularly.  . Stroke [facility])     Patient Active Problem List   Diagnosis Date Noted  . COPD (chronic obstructive pulmonary disease) ([place]) 07/16/2016  . Chronic diastolic heart failure ([place]) 03/23/2016  . Hypercarbia   . COPD exacerbation ([place]) 11/17/2015  . HTN (hypertension) 06/14/2015  . Dental caries 03/08/2015  . Morbid obesity ([place]) 02/21/2015  . OSA and COPD overlap syndrome ([place]) 02/21/2015  . Thrombocytopenia ([place]) 02/21/2015  . Hypernatremia 02/21/2015  . Diabetes ([place]) 10/19/2014  . Chronic combined systolic and diastolic congestive heart failure ([place]) 10/18/2014  . Chronic obstructive pulmonary disease ([place]) 10/18/2014  . Sarcoidosis 04/22/2013  . Other and unspecified hyperlipidemia 04/22/2013    Past Surgical History:  Procedure Laterality Date  . APPENDECTOMY    . PARTIAL NEPHRECTOMY      Prior to Admission medications   Medication Sig Start Date End Date Taking? Authorizing Provider  albuterol (PROVENTIL) (2.5 MG/3ML) 0.083% nebulizer solution Take 2.5 mg by nebulization every 6 (six) hours as needed for wheezing or shortness of breath.    [provider]   amLODipine (NORVASC) 5 MG tablet Take 5 mg by mouth daily. 03/12/16   [provider]  apixaban (ELIQUIS) 5 MG TABS tablet Take 5 mg by mouth 2 (two) times daily. 06/11/16   [provider]  aspirin 81 MG chewable tablet Chew 81 mg by mouth daily.    [provider]  atorvastatin (LIPITOR) 40 MG tablet Take 40 mg by mouth at bedtime.     [provider]  cyclobenzaprine (FLEXERIL) 10 MG tablet Take 1 tablet (10 mg total) by mouth 3 (three) times daily as needed for muscle spasms. 10/24/17   Heather Mckendree B, FNP  fluticasone (FLONASE) 50 MCG/ACT nasal spray Place 2 sprays into both nostrils daily. 07/03/16   Vaughan Basta, MD  Fluticasone-Salmeterol (ADVAIR) 250-50 MCG/DOSE AEPB Inhale 1 puff into the lungs 2 (two) times daily.    [provider]  furosemide (LASIX) 80 MG tablet Take 1 tablet (80 mg total) by mouth daily. 05/10/16   Alisa Graff, FNP  furosemide (LASIX) 80 MG tablet TAKE ONE TABLET BY MOUTH TWICE DAILY 08/20/16   Darylene Price A, FNP  insulin detemir (LEVEMIR) 100 UNIT/ML injection Inject 40-50 Units into the skin at bedtime.     [provider]  insulin lispro (HUMALOG) 100 UNIT/ML injection Inject 12-15 Units into the skin 3 (three) times daily with meals. Sliding scale as needed    [provider]  ipratropium-albuterol (DUONEB) 0.5-2.5 (3) MG/3ML SOLN Take 3 mLs by nebulization 4 (four) times daily. 02/21/15   Theodoro Grist, MD  isosorbide dinitrate (ISORDIL) 10 MG tablet Take 1  tablet (10 mg total) by mouth 3 (three) times daily. 04/01/15   Alisa Graff, FNP  KLOR-CON M20 20 MEQ tablet TAKE ONE TABLET BY MOUTH TWICE DAILY 08/20/16   Darylene Price A, FNP  lisinopril (PRINIVIL,ZESTRIL) 10 MG tablet Take 10 mg by mouth daily.    [provider]  metFORMIN (GLUCOPHAGE-XR) 500 MG 24 hr tablet Take 500 mg by mouth 2 (two) times daily.     [provider]  methocarbamol (ROBAXIN) 500 MG tablet 1-2  tablets qid prn muscle spasms 07/22/16   Letitia Neri L, PA-C  metoprolol tartrate (LOPRESSOR) 100 MG tablet Take 50 mg by mouth 2 (two) times daily.  07/03/16   [provider]  predniSONE (STERAPRED UNI-PAK 21 TAB) 10 MG (21) TBPK tablet Take 6 tablets on day 1 and decrease by 1 tablet each day until finished. 10/24/17   Amor Hyle, Johnette Abraham B, FNP  theophylline (THEODUR) 300 MG 12 hr tablet Take 300 mg by mouth daily. 04/05/16   [provider]  tiotropium (SPIRIVA) 18 MCG inhalation capsule Place 18 mcg into inhaler and inhale daily.    [provider]  traMADol (ULTRAM) 50 MG tablet Take 1 tablet (50 mg total) by mouth every 6 (six) hours as needed. 10/24/17   Victorino Dike, FNP    Allergies Penicillins  Family History  Problem Relation Age of Onset  . Hypertension Mother   . Heart disease Mother   . Diabetes Mother   . Cancer Father     Social History Social History   Tobacco Use  . Smoking status: Current Every Day Smoker    Packs/day: 0.25    Years: 25.00    Pack years: 6.25    Types: Cigarettes    Last attempt to quit: 02/05/2015    Years since quitting: 2.7  . Smokeless tobacco: Never Used  Substance Use Topics  . Alcohol use: Yes    Alcohol/week: 0.0 standard drinks    Comment: occassional  . Drug use: Yes    Types: Marijuana    Comment: last use December 2016    Review of Systems Constitutional: Negative for fever. Cardiovascular: Negative for chest pain. Respiratory: Negative for shortness of breath. Musculoskeletal: Positive for right hip and buttock pain. Skin: Negative for open wound or lesion.  Neurological: Negative for decrease in sensation  ____________________________________________   PHYSICAL EXAM:  VITAL SIGNS: ED Triage Vitals  Enc Vitals Group     BP 10/24/17 1032 139/85     Pulse Rate 10/24/17 1032 88     Resp 10/24/17 1032 16     Temp 10/24/17 1032 (!) 97.5 F (36.4 C)     Temp Source 10/24/17 1032 Oral      SpO2 10/24/17 1032 92 %     Weight 10/24/17 1029 265 lb (120.2 kg)     Height 10/24/17 1029 6\' 1"  (1.854 m)     Head Circumference --      Peak Flow --      Pain Score 10/24/17 1028 10     Pain Loc --      Pain Edu? --      Excl. in [place]? --     Constitutional: Alert and oriented. Well appearing and in no acute distress. Eyes: Conjunctivae are clear without discharge or drainage Head: Atraumatic Neck: Supple Respiratory: No cough. Respirations are even and unlabored. Musculoskeletal: Positive straight leg on the right at about 35*.  Neurologic: Motor and sensory function intact  Skin: No open  wounds or lesions on exposed skin.  Psychiatric: Affect and behavior are appropriate.  ____________________________________________   LABS (all labs ordered are listed, but only abnormal results are displayed)  Labs Reviewed - No data to display ____________________________________________  RADIOLOGY  Not indicated. ____________________________________________   PROCEDURES  Procedures  ____________________________________________   INITIAL IMPRESSION / ASSESSMENT AND PLAN / ED COURSE  [name] is a 47 y.o. who presents to the emergency department for treatment and evaluation of right hip and buttock pain. Symptoms and exam consistent with sciatica. He will be treated with flexeril, naprosyn, and prednisone. He is to follow up with his PCP for symptoms that are not improving with medications, time, and rest. He is to return to the ER for symptoms that change or worsen or for new concerns if unable to schedule an appointment.  Medications - No data to display  Pertinent labs & imaging results that were available during my care of the patient were reviewed by me and considered in my medical decision making (see chart for details).  _________________________________________   FINAL CLINICAL IMPRESSION(S) / ED DIAGNOSES  Final diagnoses:  Acute back pain with sciatica,  right    ED Discharge Orders         Ordered    cyclobenzaprine (FLEXERIL) 10 MG tablet  3 times daily PRN,   Status:  Discontinued     10/24/17 1130    traMADol (ULTRAM) 50 MG tablet  Every 6 hours PRN     10/24/17 1130    predniSONE (STERAPRED UNI-PAK 21 TAB) 10 MG (21) TBPK tablet  Status:  Discontinued     10/24/17 1130    cyclobenzaprine (FLEXERIL) 10 MG tablet  3 times daily PRN     10/24/17 1134    predniSONE (STERAPRED UNI-PAK 21 TAB) 10 MG (21) TBPK tablet     10/24/17 1134           If controlled substance prescribed during this visit, 12 month history viewed on the [place] prior to issuing an initial prescription for Schedule II or III opiod.    Victorino Dike, FNP 10/24/17 1943    Lisa Roca, MD 10/28/17 1020

## 2017-10-24 NOTE — ED Triage Notes (Signed)
Pt to ED via POV. Pt c/o right hip pain and tailbone x1 week. Pt denies any trauma or injury to area.

## 2017-10-24 NOTE — ED Notes (Signed)
See triage note   Presents with pain to right hip area and coccyx  Denies any injury

## 2018-02-03 ENCOUNTER — Emergency Department: Payer: Medicare Other

## 2018-02-03 ENCOUNTER — Inpatient Hospital Stay
Admission: EM | Admit: 2018-02-03 | Discharge: 2018-02-06 | DRG: 190 | Disposition: A | Discharge status: Home / Self Care | Payer: Medicare Other | Attending: Internal Medicine | Admitting: Internal Medicine

## 2018-02-03 ENCOUNTER — Other Ambulatory Visit: Payer: Self-pay

## 2018-02-03 DIAGNOSIS — N183 Chronic kidney disease, stage 3 (moderate): Secondary | ICD-10-CM | POA: Diagnosis present

## 2018-02-03 DIAGNOSIS — J9602 Acute respiratory failure with hypercapnia: Secondary | ICD-10-CM | POA: Diagnosis not present

## 2018-02-03 DIAGNOSIS — Z833 Family history of diabetes mellitus: Secondary | ICD-10-CM | POA: Diagnosis not present

## 2018-02-03 DIAGNOSIS — I4891 Unspecified atrial fibrillation: Secondary | ICD-10-CM | POA: Diagnosis present

## 2018-02-03 DIAGNOSIS — I5032 Chronic diastolic (congestive) heart failure: Secondary | ICD-10-CM | POA: Diagnosis present

## 2018-02-03 DIAGNOSIS — E1122 Type 2 diabetes mellitus with diabetic chronic kidney disease: Secondary | ICD-10-CM | POA: Diagnosis present

## 2018-02-03 DIAGNOSIS — J441 Chronic obstructive pulmonary disease with (acute) exacerbation: Principal | ICD-10-CM | POA: Diagnosis present

## 2018-02-03 DIAGNOSIS — G9341 Metabolic encephalopathy: Secondary | ICD-10-CM | POA: Diagnosis present

## 2018-02-03 DIAGNOSIS — I130 Hypertensive heart and chronic kidney disease with heart failure and stage 1 through stage 4 chronic kidney disease, or unspecified chronic kidney disease: Secondary | ICD-10-CM | POA: Diagnosis present

## 2018-02-03 DIAGNOSIS — E785 Hyperlipidemia, unspecified: Secondary | ICD-10-CM | POA: Diagnosis present

## 2018-02-03 DIAGNOSIS — F17210 Nicotine dependence, cigarettes, uncomplicated: Secondary | ICD-10-CM | POA: Diagnosis present

## 2018-02-03 DIAGNOSIS — G4730 Sleep apnea, unspecified: Secondary | ICD-10-CM | POA: Diagnosis present

## 2018-02-03 DIAGNOSIS — E7800 Pure hypercholesterolemia, unspecified: Secondary | ICD-10-CM | POA: Diagnosis present

## 2018-02-03 DIAGNOSIS — Z6836 Body mass index (BMI) 36.0-36.9, adult: Secondary | ICD-10-CM | POA: Diagnosis not present

## 2018-02-03 DIAGNOSIS — Z905 Acquired absence of kidney: Secondary | ICD-10-CM

## 2018-02-03 DIAGNOSIS — Z79899 Other long term (current) drug therapy: Secondary | ICD-10-CM

## 2018-02-03 DIAGNOSIS — D869 Sarcoidosis, unspecified: Secondary | ICD-10-CM | POA: Diagnosis present

## 2018-02-03 DIAGNOSIS — J9622 Acute and chronic respiratory failure with hypercapnia: Secondary | ICD-10-CM | POA: Diagnosis present

## 2018-02-03 DIAGNOSIS — Z716 Tobacco abuse counseling: Secondary | ICD-10-CM | POA: Diagnosis not present

## 2018-02-03 DIAGNOSIS — J9621 Acute and chronic respiratory failure with hypoxia: Secondary | ICD-10-CM | POA: Diagnosis present

## 2018-02-03 DIAGNOSIS — Z8249 Family history of ischemic heart disease and other diseases of the circulatory system: Secondary | ICD-10-CM | POA: Diagnosis not present

## 2018-02-03 DIAGNOSIS — Z794 Long term (current) use of insulin: Secondary | ICD-10-CM

## 2018-02-03 DIAGNOSIS — Z79890 Hormone replacement therapy (postmenopausal): Secondary | ICD-10-CM

## 2018-02-03 DIAGNOSIS — I2510 Atherosclerotic heart disease of native coronary artery without angina pectoris: Secondary | ICD-10-CM | POA: Diagnosis present

## 2018-02-03 DIAGNOSIS — Z792 Long term (current) use of antibiotics: Secondary | ICD-10-CM | POA: Diagnosis not present

## 2018-02-03 DIAGNOSIS — E875 Hyperkalemia: Secondary | ICD-10-CM | POA: Diagnosis present

## 2018-02-03 DIAGNOSIS — Z7982 Long term (current) use of aspirin: Secondary | ICD-10-CM

## 2018-02-03 DIAGNOSIS — Z8673 Personal history of transient ischemic attack (TIA), and cerebral infarction without residual deficits: Secondary | ICD-10-CM

## 2018-02-03 DIAGNOSIS — Z7901 Long term (current) use of anticoagulants: Secondary | ICD-10-CM | POA: Diagnosis not present

## 2018-02-03 DIAGNOSIS — R0902 Hypoxemia: Secondary | ICD-10-CM | POA: Diagnosis present

## 2018-02-03 DIAGNOSIS — Z9119 Patient's noncompliance with other medical treatment and regimen: Secondary | ICD-10-CM

## 2018-02-03 DIAGNOSIS — Z880 Allergy status to penicillin: Secondary | ICD-10-CM | POA: Diagnosis not present

## 2018-02-03 DIAGNOSIS — Z7951 Long term (current) use of inhaled steroids: Secondary | ICD-10-CM

## 2018-02-03 DIAGNOSIS — Z7952 Long term (current) use of systemic steroids: Secondary | ICD-10-CM

## 2018-02-03 DIAGNOSIS — Z79891 Long term (current) use of opiate analgesic: Secondary | ICD-10-CM

## 2018-02-03 LAB — TROPONIN I
TROPONIN I: 0.03 ng/mL — AB (ref <0.03)
Troponin I: 0.04 ng/mL (ref <0.03)

## 2018-02-03 LAB — PROTIME-INR
INR: 0.99
Prothrombin Time: 13 seconds (ref 11.4–15.2)

## 2018-02-03 LAB — BASIC METABOLIC PANEL
Anion gap: 6 (ref 5–15)
BUN: 16 mg/dL (ref 6–20)
CALCIUM: 8.7 mg/dL — AB (ref 8.9–10.3)
CHLORIDE: 96 mmol/L — AB (ref 98–111)
CO2: 37 mmol/L — AB (ref 22–32)
Creatinine, Ser: 1.39 mg/dL — ABNORMAL HIGH (ref 0.61–1.24)
GFR calc non Af Amer: 60 mL/min — ABNORMAL LOW (ref >60)
Glucose, Bld: 168 mg/dL — ABNORMAL HIGH (ref 70–99)
Potassium: 3.7 mmol/L (ref 3.5–5.1)
Sodium: 139 mmol/L (ref 135–145)

## 2018-02-03 LAB — CBC
HCT: 52 % (ref 39.0–52.0)
HEMOGLOBIN: 15.9 g/dL (ref 13.0–17.0)
MCH: 27.7 pg (ref 26.0–34.0)
MCHC: 30.6 g/dL (ref 30.0–36.0)
MCV: 90.8 fL (ref 80.0–100.0)
NRBC: 0 % (ref 0.0–0.2)
PLATELETS: 227 10*3/uL (ref 150–400)
RBC: 5.73 MIL/uL (ref 4.22–5.81)
RDW: 12.8 % (ref 11.5–15.5)
WBC: 6.3 10*3/uL (ref 4.0–10.5)

## 2018-02-03 LAB — GLUCOSE, CAPILLARY: Glucose-Capillary: 189 mg/dL — ABNORMAL HIGH (ref 70–99)

## 2018-02-03 LAB — BRAIN NATRIURETIC PEPTIDE: B Natriuretic Peptide: 22 pg/mL (ref 0.0–100.0)

## 2018-02-03 MED ORDER — IPRATROPIUM BROMIDE 0.02 % IN SOLN
0.5000 mg | Freq: Once | RESPIRATORY_TRACT | Status: AC
  Administered 2018-02-03: 0.5 mg via RESPIRATORY_TRACT
  Filled 2018-02-03: qty 2.5

## 2018-02-03 MED ORDER — SODIUM CHLORIDE 0.9% FLUSH: 3.0000 mL | INTRAVENOUS | Status: DC | PRN

## 2018-02-03 MED ORDER — CYCLOBENZAPRINE HCL 10 MG PO TABS
10.0000 mg | ORAL_TABLET | Freq: Three times a day (TID) | ORAL | Status: DC | PRN
  Filled 2018-02-03: qty 1

## 2018-02-03 MED ORDER — IPRATROPIUM-ALBUTEROL 0.5-2.5 (3) MG/3ML IN SOLN
3.0000 mL | RESPIRATORY_TRACT | Status: DC
  Administered 2018-02-03 – 2018-02-04 (×2): 3 mL via RESPIRATORY_TRACT
  Filled 2018-02-03 (×2): qty 3

## 2018-02-03 MED ORDER — ATORVASTATIN CALCIUM 20 MG PO TABS
40.0000 mg | ORAL_TABLET | Freq: Every day | ORAL | Status: DC
  Administered 2018-02-03 – 2018-02-05 (×3): 40 mg via ORAL
  Filled 2018-02-03 (×3): qty 2

## 2018-02-03 MED ORDER — ONDANSETRON HCL 4 MG PO TABS: 4.0000 mg | ORAL_TABLET | Freq: Four times a day (QID) | ORAL | Status: DC | PRN

## 2018-02-03 MED ORDER — THEOPHYLLINE ER 300 MG PO TB12
300.0000 mg | ORAL_TABLET | Freq: Two times a day (BID) | ORAL | Status: DC
  Administered 2018-02-03 – 2018-02-06 (×6): 300 mg via ORAL
  Filled 2018-02-03 (×7): qty 1

## 2018-02-03 MED ORDER — MELATONIN 5 MG PO TABS
5.0000 mg | ORAL_TABLET | Freq: Every evening | ORAL | Status: DC | PRN
  Filled 2018-02-03: qty 1

## 2018-02-03 MED ORDER — INSULIN DETEMIR 100 UNIT/ML ~~LOC~~ SOLN
40.0000 [IU] | Freq: Every day | SUBCUTANEOUS | Status: DC
  Administered 2018-02-03 – 2018-02-06 (×3): 40 [IU] via SUBCUTANEOUS
  Filled 2018-02-03 (×5): qty 0.4

## 2018-02-03 MED ORDER — METOPROLOL TARTRATE 50 MG PO TABS
50.0000 mg | ORAL_TABLET | Freq: Two times a day (BID) | ORAL | Status: DC
  Administered 2018-02-03 – 2018-02-06 (×6): 50 mg via ORAL
  Filled 2018-02-03 (×6): qty 1

## 2018-02-03 MED ORDER — ISOSORBIDE DINITRATE 10 MG PO TABS
10.0000 mg | ORAL_TABLET | Freq: Three times a day (TID) | ORAL | Status: DC
  Administered 2018-02-04 – 2018-02-06 (×4): 10 mg via ORAL
  Filled 2018-02-03 (×11): qty 1

## 2018-02-03 MED ORDER — GUAIFENESIN ER 600 MG PO TB12
600.0000 mg | ORAL_TABLET | Freq: Two times a day (BID) | ORAL | Status: DC
  Administered 2018-02-04 – 2018-02-06 (×3): 600 mg via ORAL
  Filled 2018-02-03 (×6): qty 1

## 2018-02-03 MED ORDER — TIOTROPIUM BROMIDE MONOHYDRATE 18 MCG IN CAPS
18.0000 ug | ORAL_CAPSULE | Freq: Every day | RESPIRATORY_TRACT | Status: DC
  Administered 2018-02-04: 09:00:00 18 ug via RESPIRATORY_TRACT
  Filled 2018-02-03: qty 5

## 2018-02-03 MED ORDER — LISINOPRIL 20 MG PO TABS: 20.0000 mg | ORAL_TABLET | Freq: Every day | ORAL | Status: DC

## 2018-02-03 MED ORDER — AMLODIPINE BESYLATE 5 MG PO TABS
5.0000 mg | ORAL_TABLET | Freq: Every day | ORAL | Status: DC
  Administered 2018-02-04 – 2018-02-06 (×3): 5 mg via ORAL
  Filled 2018-02-03 (×3): qty 1

## 2018-02-03 MED ORDER — SODIUM CHLORIDE 0.9 % IV SOLN: 250.0000 mL | INTRAVENOUS | Status: DC | PRN

## 2018-02-03 MED ORDER — APIXABAN 5 MG PO TABS
5.0000 mg | ORAL_TABLET | Freq: Two times a day (BID) | ORAL | Status: DC
  Administered 2018-02-03 – 2018-02-06 (×6): 5 mg via ORAL
  Filled 2018-02-03 (×6): qty 1

## 2018-02-03 MED ORDER — SODIUM CHLORIDE 0.9% FLUSH
3.0000 mL | Freq: Two times a day (BID) | INTRAVENOUS | Status: DC
  Administered 2018-02-03 – 2018-02-06 (×6): 3 mL via INTRAVENOUS

## 2018-02-03 MED ORDER — METHYLPREDNISOLONE SODIUM SUCC 125 MG IJ SOLR
125.0000 mg | Freq: Once | INTRAMUSCULAR | Status: AC
  Administered 2018-02-03: 125 mg via INTRAVENOUS
  Filled 2018-02-03: qty 2

## 2018-02-03 MED ORDER — ONDANSETRON HCL 4 MG/2ML IJ SOLN
INTRAMUSCULAR | Status: AC
  Administered 2018-02-03: 19:00:00
  Filled 2018-02-03: qty 2

## 2018-02-03 MED ORDER — ASPIRIN 81 MG PO CHEW
81.0000 mg | CHEWABLE_TABLET | Freq: Every day | ORAL | Status: DC
  Administered 2018-02-04 – 2018-02-06 (×3): 81 mg via ORAL
  Filled 2018-02-03 (×3): qty 1

## 2018-02-03 MED ORDER — TRAMADOL HCL 50 MG PO TABS: 50.0000 mg | ORAL_TABLET | Freq: Four times a day (QID) | ORAL | Status: DC | PRN

## 2018-02-03 MED ORDER — METHOCARBAMOL 500 MG PO TABS
500.0000 mg | ORAL_TABLET | Freq: Four times a day (QID) | ORAL | Status: DC | PRN
  Filled 2018-02-03: qty 2

## 2018-02-03 MED ORDER — IOPAMIDOL (ISOVUE-370) INJECTION 76%
75.0000 mL | Freq: Once | INTRAVENOUS | Status: AC | PRN
  Administered 2018-02-03: 75 mL via INTRAVENOUS

## 2018-02-03 MED ORDER — ALBUTEROL SULFATE (2.5 MG/3ML) 0.083% IN NEBU
5.0000 mg | INHALATION_SOLUTION | Freq: Once | RESPIRATORY_TRACT | Status: AC
  Administered 2018-02-03: 5 mg via RESPIRATORY_TRACT
  Filled 2018-02-03: qty 6

## 2018-02-03 MED ORDER — METHYLPREDNISOLONE SODIUM SUCC 125 MG IJ SOLR
60.0000 mg | Freq: Four times a day (QID) | INTRAMUSCULAR | Status: DC
  Administered 2018-02-03 – 2018-02-05 (×8): 60 mg via INTRAVENOUS
  Filled 2018-02-03 (×8): qty 2

## 2018-02-03 MED ORDER — BUDESONIDE 0.25 MG/2ML IN SUSP
0.2500 mg | Freq: Two times a day (BID) | RESPIRATORY_TRACT | Status: DC
  Administered 2018-02-04 – 2018-02-06 (×5): 0.25 mg via RESPIRATORY_TRACT
  Filled 2018-02-03 (×6): qty 2

## 2018-02-03 MED ORDER — ONDANSETRON HCL 4 MG/2ML IJ SOLN: 4.0000 mg | Freq: Four times a day (QID) | INTRAMUSCULAR | Status: DC | PRN

## 2018-02-03 MED ORDER — ASPIRIN 81 MG PO CHEW
324.0000 mg | CHEWABLE_TABLET | Freq: Once | ORAL | Status: AC
  Administered 2018-02-03: 324 mg via ORAL
  Filled 2018-02-03: qty 4

## 2018-02-03 NOTE — ED Notes (Signed)
[name] wife of patient in room 258. Call and give update [phone]

## 2018-02-03 NOTE — ED Notes (Signed)
Patient transported to CT. Will start breathing treatments once patient arrives back. MD aware

## 2018-02-03 NOTE — H&P (Signed)
[name] NAME: [name]    MR#:  [id number]  DATE OF BIRTH:  Oct 09, 1970  DATE OF ADMISSION:  02/03/2018  PRIMARY CARE PHYSICIAN: Soles, Howell Rucks, MD   REQUESTING/REFERRING PHYSICIAN: Schuyler Amor, MD  CHIEF COMPLAINT:   Chief Complaint  Patient presents with  . Chest Pain  . Headache  . Shortness of Breath    HISTORY OF PRESENT ILLNESS: [name]  is a 47 y.o. male with a known history of congestive heart failure, COPD, coronary artery disease, diabetes, hypertension, sarcoidosis who is presenting to the hospital with complaint of shortness of breath ongoing for the past few days.  She states that is progressively worse.  He only uses oxygen at nighttime 2 L but also uses BiPAP according to him for sleep apnea.  Patient states that his breathing continued to get worse therefore he came to the ED.  In the ER he was evaluated with a CT scan of the chest which showed no evidence of pulmonary embolism.  Patient states that he has had some dry cough.  He also has had lower extremity swelling. PAST MEDICAL HISTORY:   Past Medical History:  Diagnosis Date  . CHF (congestive heart failure) ([place])   . COPD (chronic obstructive pulmonary disease) ([place])   . Coronary artery disease   . Diabetes mellitus without complication ([address])   . Hypercholesteremia unk  . Hypertension   . Sarcoidosis   . Sleep apnea    does not use CPAP regularly.  . Stroke [facility])     PAST SURGICAL HISTORY:  Past Surgical History:  Procedure Laterality Date  . APPENDECTOMY    . PARTIAL NEPHRECTOMY      SOCIAL HISTORY:  Social History   Tobacco Use  . Smoking status: Current Every Day Smoker    Packs/day: 0.25    Years: 25.00    Pack years: 6.25    Types: Cigarettes    Last attempt to quit: 02/05/2015    Years since quitting: 2.9  . Smokeless tobacco: Never Used  Substance Use Topics  . Alcohol use: Yes    Alcohol/week: 0.0 standard  drinks    Comment: occassional    FAMILY HISTORY:  Family History  Problem Relation Age of Onset  . Hypertension Mother   . Heart disease Mother   . Diabetes Mother   . Cancer Father     DRUG ALLERGIES:  Allergies  Allergen Reactions  . Penicillins Anaphylaxis and Other (See Comments)    Has patient had a PCN reaction causing immediate rash, facial/tongue/throat swelling, SOB or lightheadedness with hypotension: Yes Has patient had a PCN reaction causing severe rash involving mucus membranes or skin necrosis: No Has patient had a PCN reaction that required hospitalization: No Has patient had a PCN reaction occurring within the last 10 years: No If all of the above answers are "NO", then may proceed with Cephalosporin use.     REVIEW OF SYSTEMS:   CONSTITUTIONAL: No fever, fatigue or weakness.  EYES: No blurred or double vision.  EARS, NOSE, AND THROAT: No tinnitus or ear pain.  RESPIRATORY: Dry cough, positive shortness of breath, wheezing or hemoptysis.  CARDIOVASCULAR: No chest pain, orthopnea, edema.  GASTROINTESTINAL: No nausea, vomiting, diarrhea or abdominal pain.  GENITOURINARY: No dysuria, hematuria.  ENDOCRINE: No polyuria, nocturia,  HEMATOLOGY: No anemia, easy bruising or bleeding SKIN: No rash or lesion. MUSCULOSKELETAL: No joint pain or arthritis.   NEUROLOGIC: No tingling, numbness,  weakness.  PSYCHIATRY: No anxiety or depression.   MEDICATIONS AT HOME:  Prior to Admission medications   Medication Sig Start Date End Date Taking? Authorizing Provider  albuterol (PROVENTIL) (2.5 MG/3ML) 0.083% nebulizer solution Take 2.5 mg by nebulization every 6 (six) hours as needed for wheezing or shortness of breath.   Yes [provider]  amLODipine (NORVASC) 5 MG tablet Take 5 mg by mouth daily. 03/12/16  Yes [provider]  apixaban (ELIQUIS) 5 MG TABS tablet Take 5 mg by mouth 2 (two) times daily. 06/11/16  Yes [provider]  aspirin 81 MG  chewable tablet Chew 81 mg by mouth daily.   Yes [provider]  atorvastatin (LIPITOR) 40 MG tablet Take 40 mg by mouth at bedtime.    Yes [provider]  fluticasone (FLONASE) 50 MCG/ACT nasal spray Place 2 sprays into both nostrils daily. Patient taking differently: Place 2 sprays into both nostrils daily as needed for allergies or rhinitis.  07/03/16  Yes Vaughan Basta, MD  Fluticasone-Salmeterol (ADVAIR) 250-50 MCG/DOSE AEPB Inhale 1 puff into the lungs 2 (two) times daily.   Yes [provider]  furosemide (LASIX) 80 MG tablet Take 1 tablet (80 mg total) by mouth daily. 05/10/16  Yes Hackney, Otila Kluver A, FNP  insulin detemir (LEVEMIR) 100 UNIT/ML injection Inject 40-50 Units into the skin at bedtime.    Yes [provider]  insulin lispro (HUMALOG) 100 UNIT/ML injection Inject 12-15 Units into the skin 3 (three) times daily with meals. Sliding scale as needed   Yes [provider]  ipratropium-albuterol (DUONEB) 0.5-2.5 (3) MG/3ML SOLN Take 3 mLs by nebulization 4 (four) times daily. Patient taking differently: Take 3 mLs by nebulization 4 (four) times daily as needed (shortness of breath / wheezing).  02/21/15  Yes Theodoro Grist, MD  isosorbide dinitrate (ISORDIL) 10 MG tablet Take 1 tablet (10 mg total) by mouth 3 (three) times daily. 04/01/15  Yes Hackney, Tina A, FNP  KLOR-CON M20 20 MEQ tablet TAKE ONE TABLET BY MOUTH TWICE DAILY Patient taking differently: Take 20 mEq by mouth daily.  08/20/16  Yes Hackney, Tina A, FNP  lisinopril (PRINIVIL,ZESTRIL) 20 MG tablet Take 20 mg by mouth daily.    Yes [provider]  Melatonin 5 MG TABS Take 5 mg by mouth at bedtime as needed for sleep.   Yes [provider]  metoprolol tartrate (LOPRESSOR) 50 MG tablet Take 50 mg by mouth 2 (two) times daily.    Yes [provider]  theophylline (THEODUR) 300 MG 12 hr tablet Take 300 mg by mouth 2 (two) times daily.    Yes [provider]  tiotropium (SPIRIVA) 18 MCG inhalation capsule Place 18 mcg into inhaler and inhale daily.   Yes [provider]  cyclobenzaprine (FLEXERIL) 10 MG tablet Take 1 tablet (10 mg total) by mouth 3 (three) times daily as needed for muscle spasms. 10/24/17   Sherrie George B, FNP  furosemide (LASIX) 80 MG tablet TAKE ONE TABLET BY MOUTH TWICE DAILY 08/20/16   Alisa Graff, FNP  methocarbamol (ROBAXIN) 500 MG tablet 1-2 tablets qid prn muscle spasms 07/22/16   Johnn Hai, PA-C  predniSONE (STERAPRED UNI-PAK 21 TAB) 10 MG (21) TBPK tablet Take 6 tablets on day 1 and decrease by 1 tablet each day until finished. 10/24/17   Triplett, Johnette Abraham B, FNP  traMADol (ULTRAM) 50 MG tablet Take 1 tablet (50 mg total) by mouth every 6 (six) hours as needed.  10/24/17   Triplett, Cari B, FNP      PHYSICAL EXAMINATION:   VITAL SIGNS: Blood pressure (!) 147/95, pulse 95, temperature 98.5 F (36.9 C), temperature source Oral, resp. rate (!) 36, height 6\' 1"  (1.854 m), weight 126.6 kg, SpO2 96 %.  GENERAL:  47 y.o.-year-old patient lying in the bed with no acute distress.  EYES: Pupils equal, round, reactive to light and accommodation. No scleral icterus. Extraocular muscles intact.  HEENT: Head atraumatic, normocephalic. Oropharynx and nasopharynx clear.  NECK:  Supple, no jugular venous distention. No thyroid enlargement, no tenderness.  LUNGS: Diminished breath sounds bilaterally with accessory muscle usage  cARDIOVASCULAR: S1, S2 normal. No murmurs, rubs, or gallops.  ABDOMEN: Soft, nontender, nondistended. Bowel sounds present. No organomegaly or mass.  EXTREMITIES: 2+ pedal edema, cyanosis, or clubbing.  NEUROLOGIC: Cranial nerves II through XII are intact. Muscle strength 5/5 in all extremities. Sensation intact. Gait not checked.  PSYCHIATRIC: The patient is alert and oriented x 3.  SKIN: No obvious rash, lesion, or ulcer.   LABORATORY PANEL:   CBC Recent Labs  Lab  02/03/18 1505  WBC 6.3  HGB 15.9  HCT 52.0  PLT 227  MCV 90.8  MCH 27.7  MCHC 30.6  RDW 12.8   ------------------------------------------------------------------------------------------------------------------  Chemistries  Recent Labs  Lab 02/03/18 1505  NA 139  K 3.7  CL 96*  CO2 37*  GLUCOSE 168*  BUN 16  CREATININE 1.39*  CALCIUM 8.7*   ------------------------------------------------------------------------------------------------------------------ estimated creatinine clearance is 91.6 mL/min (A) (by C-G formula based on SCr of 1.39 mg/dL (H)). ------------------------------------------------------------------------------------------------------------------ No results for input(s): TSH, T4TOTAL, T3FREE, THYROIDAB in the last 72 hours.  Invalid input(s): FREET3   Coagulation profile Recent Labs  Lab 02/03/18 1843  INR 0.99   ------------------------------------------------------------------------------------------------------------------- No results for input(s): DDIMER in the last 72 hours. -------------------------------------------------------------------------------------------------------------------  Cardiac Enzymes Recent Labs  Lab 02/03/18 1505 02/03/18 1843  TROPONINI 0.03* 0.04*   ------------------------------------------------------------------------------------------------------------------ Invalid input(s): POCBNP  ---------------------------------------------------------------------------------------------------------------  Urinalysis    Component Value Date/Time   COLORURINE YELLOW (A) 07/22/2016 1520   APPEARANCEUR CLEAR (A) 07/22/2016 1520   APPEARANCEUR Hazy 06/01/2013 1139   LABSPEC 1.019 07/22/2016 1520   LABSPEC 1.030 06/01/2013 1139   PHURINE 6.0 07/22/2016 1520   GLUCOSEU NEGATIVE 07/22/2016 1520   GLUCOSEU Negative 06/01/2013 1139   HGBUR NEGATIVE 07/22/2016 1520   BILIRUBINUR NEGATIVE 07/22/2016 1520   BILIRUBINUR  Negative 06/01/2013 1139   KETONESUR NEGATIVE 07/22/2016 1520   PROTEINUR NEGATIVE 07/22/2016 1520   NITRITE NEGATIVE 07/22/2016 1520   LEUKOCYTESUR NEGATIVE 07/22/2016 1520   LEUKOCYTESUR Negative 06/01/2013 1139     RADIOLOGY: Dg Chest 2 View  Result Date: 02/03/2018 CLINICAL DATA:  One day history of mid chest pain. Two day history of shortness of breath. Onset of headache this morning. History of COPD, sarcoidosis, CHF, coronary artery disease, current smoker. EXAM: CHEST - 2 VIEW COMPARISON:  PA and lateral chest x-ray of July 15, 2016 FINDINGS: There is chronic pleuroparenchymal scarring bilaterally. There is mild chronic elevation of the left hemidiaphragm. There is no alveolar infiltrate. The heart and pulmonary vascularity are normal. The mediastinum is normal in width. The bony thorax exhibits no acute abnormality. IMPRESSION: Chronic stable appearing pleuroparenchymal changes bilaterally consistent with known sarcoidosis, COPD, and the patient's smoking history. No acute cardiopulmonary abnormality. Electronically Signed   By: David  Martinique M.D.   On: 02/03/2018 15:32   Ct Angio Chest Pe W And/or Wo Contrast  Result Date: 02/03/2018 CLINICAL DATA:  Shortness of breath, chest tightness. EXAM: CT ANGIOGRAPHY CHEST WITH CONTRAST TECHNIQUE: Multidetector CT imaging of the chest was performed using the standard protocol during bolus administration of intravenous contrast. Multiplanar CT image reconstructions and MIPs were obtained to evaluate the vascular anatomy. CONTRAST:  2mL ISOVUE-370 IOPAMIDOL (ISOVUE-370) INJECTION 76% COMPARISON:  None. FINDINGS: Cardiovascular: No filling defects in the pulmonary arteries to suggest pulmonary emboli. Heart is borderline in size. Aorta is normal caliber. Mediastinum/Nodes: No mediastinal, hilar, or axillary adenopathy. Lungs/Pleura: Chronic areas of scarring and architectural distortion throughout the lungs bilaterally, stable. It has compatible with  known sarcoidosis. No effusions or new airspace opacities. Upper Abdomen: Imaging into the upper abdomen shows no acute findings. Musculoskeletal: Mild bilateral gynecomastia. No acute bony abnormality. Review of the MIP images confirms the above findings. IMPRESSION: Chronic changes of sarcoidosis within the lungs with areas of scarring and architectural distortion. This is stable since prior study. No evidence of pulmonary embolus. Borderline heart size. Electronically Signed   By: Rolm Baptise M.D.   On: 02/03/2018 19:50    EKG: Orders placed or performed during the hospital encounter of 02/03/18  . EKG 12-Lead  . EKG 12-Lead  . ED EKG within 10 minutes  . ED EKG within 10 minutes    IMPRESSION AND PLAN: Patient is 47 year old with history of COPD and sarcoidosis presenting with shortness of breath  1.  Acute on chronic COPD exasperation We will treat with nebulizer therapy, Solu-Medrol, Pulmicort  2.  History of diastolic CHF I do not see the echo I will repeat an echo We will give him low-dose IV Lasix monitor renal function  3.  Sleep apnea continue BiPAP at nighttime  4.  Diabetes type 2 continue Levemir place him on sliding scale insulin  5.  History of atrial fibrillation continue Eliquis  6.  Chronic kidney disease stage III monitor renal function  7.  Nicotine abuse smoking cessation provided strongly recommend he stop smoking he does not want a nicotine patch   All the records are reviewed and case discussed with ED provider. Management plans discussed with the patient, family and they are in agreement.  CODE STATUS: Code Status History    Date Active Date Inactive Code Status Order ID Comments User Context   07/15/2016 0632 07/16/2016 1741 Full Code [id number]  Saundra Shelling, MD Inpatient   07/01/2016 1701 07/03/2016 1853 Full Code [id number]  Henreitta Leber, MD Inpatient   03/26/2016 0043 03/28/2016 2001 Full Code [id number]  Harvie Bridge, DO Inpatient    11/17/2015 1131 11/18/2015 1845 Full Code [id number]  Bettey Costa, MD Inpatient   02/11/2015 1417 02/21/2015 1841 Full Code [id number]  Henreitta Leber, MD Inpatient   12/28/2014 2134 12/30/2014 1602 Full Code [id number]  Fritzi Mandes, MD Inpatient   08/24/2014 0245 08/26/2014 1819 Full Code [id number]  Lytle Butte, MD ED   08/14/2014 1031 08/18/2014 1830 Full Code [id number]  Vaughan Basta, MD Inpatient       TOTAL TIME TAKING CARE OF THIS PATIENT:55 minutes.    Dustin Flock M.D on 02/03/2018 at 8:19 PM  Between 7am to 6pm - Pager - [phone]  After 6pm go to [email or web address] - password [email or web address]  [facility] Office  [phone]  CC: Primary care physician; Herminio Commons, MD

## 2018-02-03 NOTE — ED Notes (Signed)
Patient transported to CT 

## 2018-02-03 NOTE — ED Notes (Signed)
Called pt's wife and updated her on pt's status and plan of care

## 2018-02-03 NOTE — ED Notes (Signed)
Pt given crackers and applesauce. [place] per MD

## 2018-02-03 NOTE — ED Notes (Signed)
Admitting MD Posey Pronto at bedside

## 2018-02-03 NOTE — ED Notes (Signed)
Upon entering room pt was hunched sitting over on bed. Pt states he is SOB. Pt placed on monitor. O2 reading in 65-68% RA. MD at bedside. Pt placed on nasal cannula at 4 L and advised to take slow deep breaths, O2 increased to 91%. Pt placed on nonrebreather per MD order. O2 now at 100%. Pt sleeping at this time

## 2018-02-03 NOTE — ED Provider Notes (Signed)
[facility] Emergency Department Provider Note  ____________________________________________   I have reviewed the triage vital signs and the nursing notes. Where available I have reviewed prior notes and, if possible and indicated, outside hospital notes.    HISTORY  Chief Complaint Chest Pain; Headache; and Shortness of Breath    HPI [name]  With a history of COPD, on 2 L oxygen at night only, followed by Dr. [name], continues to smoke tobacco. He also is on Lasix, has had CAD in the past, last echo shows the EF of 60% with moderate LVH and grade 1 diastolic dysfunction.  Patient states that over the last couple weeks has had progressive shortness of breath and some chest tightness over the last 3 days.  He is had not had any significant URI symptoms such as cough.  He does have lower extremity edema which seems to be worse to him.  He is on Lasix, he states there have been periods when he has been noncompliant with it.  He states that he is not have any radiation of the chest pain and is a just generalized discomfort in his chest is been there constantly for the last 3 days.  He also states he has a slight headache. Gradual onset headache not worst headache of life.  He has had no tearing pain to his back.  He is somewhat equivocal about whether he has had pleuritic pain.   Past Medical History:  Diagnosis Date  . CHF (congestive heart failure) ([place])   . COPD (chronic obstructive pulmonary disease) ([place])   . Coronary artery disease   . Diabetes mellitus without complication ([place])   . Hypercholesteremia unk  . Hypertension   . Sarcoidosis   . Sleep apnea    does not use CPAP regularly.  . Stroke [facility])     Patient Active Problem List   Diagnosis Date Noted  . COPD (chronic obstructive pulmonary disease) ([place]) 07/16/2016  . Chronic diastolic heart failure ([place]) 03/23/2016  . Hypercarbia   . COPD exacerbation ([place]) 11/17/2015   . HTN (hypertension) 06/14/2015  . Dental caries 03/08/2015  . Morbid obesity ([place]) 02/21/2015  . OSA and COPD overlap syndrome ([place]) 02/21/2015  . Thrombocytopenia ([place]) 02/21/2015  . Hypernatremia 02/21/2015  . Diabetes ([place]) 10/19/2014  . Chronic combined systolic and diastolic congestive heart failure ([place]) 10/18/2014  . Chronic obstructive pulmonary disease ([place]) 10/18/2014  . Sarcoidosis 04/22/2013  . Other and unspecified hyperlipidemia 04/22/2013    Past Surgical History:  Procedure Laterality Date  . APPENDECTOMY    . PARTIAL NEPHRECTOMY      Prior to Admission medications   Medication Sig Start Date End Date Taking? Authorizing Provider  albuterol (PROVENTIL) (2.5 MG/3ML) 0.083% nebulizer solution Take 2.5 mg by nebulization every 6 (six) hours as needed for wheezing or shortness of breath.    [provider]  amLODipine (NORVASC) 5 MG tablet Take 5 mg by mouth daily. 03/12/16   [provider]  apixaban (ELIQUIS) 5 MG TABS tablet Take 5 mg by mouth 2 (two) times daily. 06/11/16   [provider]  aspirin 81 MG chewable tablet Chew 81 mg by mouth daily.    [provider]  atorvastatin (LIPITOR) 40 MG tablet Take 40 mg by mouth at bedtime.     [provider]  cyclobenzaprine (FLEXERIL) 10 MG tablet Take 1 tablet (10 mg total) by mouth 3 (three) times daily as needed for muscle spasms.  10/24/17   Triplett, Cari B, FNP  fluticasone (FLONASE) 50 MCG/ACT nasal spray Place 2 sprays into both nostrils daily. 07/03/16   Vaughan Basta, MD  Fluticasone-Salmeterol (ADVAIR) 250-50 MCG/DOSE AEPB Inhale 1 puff into the lungs 2 (two) times daily.    [provider]  furosemide (LASIX) 80 MG tablet Take 1 tablet (80 mg total) by mouth daily. 05/10/16   Alisa Graff, FNP  furosemide (LASIX) 80 MG tablet TAKE ONE TABLET BY MOUTH TWICE DAILY 08/20/16   Darylene Price A, FNP  insulin detemir (LEVEMIR) 100 UNIT/ML injection Inject 40-50  Units into the skin at bedtime.     [provider]  insulin lispro (HUMALOG) 100 UNIT/ML injection Inject 12-15 Units into the skin 3 (three) times daily with meals. Sliding scale as needed    [provider]  ipratropium-albuterol (DUONEB) 0.5-2.5 (3) MG/3ML SOLN Take 3 mLs by nebulization 4 (four) times daily. 02/21/15   Theodoro Grist, MD  isosorbide dinitrate (ISORDIL) 10 MG tablet Take 1 tablet (10 mg total) by mouth 3 (three) times daily. 04/01/15   Alisa Graff, FNP  KLOR-CON M20 20 MEQ tablet TAKE ONE TABLET BY MOUTH TWICE DAILY 08/20/16   Darylene Price A, FNP  lisinopril (PRINIVIL,ZESTRIL) 10 MG tablet Take 10 mg by mouth daily.    [provider]  metFORMIN (GLUCOPHAGE-XR) 500 MG 24 hr tablet Take 500 mg by mouth 2 (two) times daily.     [provider]  methocarbamol (ROBAXIN) 500 MG tablet 1-2 tablets qid prn muscle spasms 07/22/16   Letitia Neri L, PA-C  metoprolol tartrate (LOPRESSOR) 100 MG tablet Take 50 mg by mouth 2 (two) times daily.  07/03/16   [provider]  predniSONE (STERAPRED UNI-PAK 21 TAB) 10 MG (21) TBPK tablet Take 6 tablets on day 1 and decrease by 1 tablet each day until finished. 10/24/17   Triplett, Johnette Abraham B, FNP  theophylline (THEODUR) 300 MG 12 hr tablet Take 300 mg by mouth daily. 04/05/16   [provider]  tiotropium (SPIRIVA) 18 MCG inhalation capsule Place 18 mcg into inhaler and inhale daily.    [provider]  traMADol (ULTRAM) 50 MG tablet Take 1 tablet (50 mg total) by mouth every 6 (six) hours as needed. 10/24/17   Victorino Dike, FNP    Allergies Penicillins  Family History  Problem Relation Age of Onset  . Hypertension Mother   . Heart disease Mother   . Diabetes Mother   . Cancer Father     Social History Social History   Tobacco Use  . Smoking status: Current Every Day Smoker    Packs/day: 0.25    Years: 25.00    Pack years: 6.25    Types: Cigarettes    Last attempt to  quit: 02/05/2015    Years since quitting: 2.9  . Smokeless tobacco: Never Used  Substance Use Topics  . Alcohol use: Yes    Alcohol/week: 0.0 standard drinks    Comment: occassional  . Drug use: Yes    Types: Marijuana    Comment: last use December 2016    Review of Systems Constitutional: No fever/chills Eyes: No visual changes. ENT: No sore throat. No stiff neck no neck pain Cardiovascular: + chest pain. Respiratory: + shortness of breath. Gastrointestinal:   no vomiting.  No diarrhea.  No constipation. Genitourinary: Negative for dysuria. Musculoskeletal: Negative lower extremity swelling Skin: Negative for rash. Neurological: Negative for severe headaches, focal weakness or numbness.   ____________________________________________  PHYSICAL EXAM:  VITAL SIGNS: ED Triage Vitals  Enc Vitals Group     BP 02/03/18 1457 (!) 153/92     Pulse Rate 02/03/18 1457 (!) 109     Resp 02/03/18 1457 20     Temp 02/03/18 1457 98.5 F (36.9 C)     Temp Source 02/03/18 1457 Oral     SpO2 02/03/18 1457 97 %     Weight 02/03/18 1457 279 lb (126.6 kg)     Height 02/03/18 1457 6\' 1"  (1.854 m)     Head Circumference --      Peak Flow --      Pain Score 02/03/18 1511 6     Pain Loc --      Pain Edu? --      Excl. in [place]? --     Constitutional: Alert and oriented.  Into the room his oxygen saturation is reading 65% with a good waveform, I tried another finger and that remained like that.  I did place him on oxygen, and he gradually came up, I placed him on a nonrebreather is now 100%.  He did not however have significant increased work of breathing. Eyes: Conjunctivae are normal Head: Atraumatic HEENT: No congestion/rhinnorhea. Mucous membranes are moist.  Oropharynx non-erythematous Neck:   Nontender with no meningismus, no masses, no stridor Cardiovascular: Normal rate, regular rhythm. Grossly normal heart sounds.  Good peripheral circulation. Respiratory: Normal respiratory  effort.  No retractions.  Diminished in the bases, occasional slight wheeze Abdominal: Soft and nontender. No distention. No guarding no rebound Back:  There is no focal tenderness or step off.  there is no midline tenderness there are no lesions noted. there is no CVA tenderness Musculoskeletal: No lower extremity tenderness, no upper extremity tenderness. No joint effusions, no DVT signs strong distal pulses positive bilateral symmetric pitting 2-3+ edema Neurologic:  Normal speech and language. No gross focal neurologic deficits are appreciated.  Skin:  Skin is warm, dry and intact. No rash noted. Psychiatric: Mood and affect are normal. Speech and behavior are normal.  ____________________________________________   LABS (all labs ordered are listed, but only abnormal results are displayed)  Labs Reviewed  BASIC METABOLIC PANEL - Abnormal; Notable for the following components:      Result Value   Chloride 96 (*)    CO2 37 (*)    Glucose, Bld 168 (*)    Creatinine, Ser 1.39 (*)    Calcium 8.7 (*)    GFR calc non Af Amer 60 (*)    All other components within normal limits  TROPONIN I - Abnormal; Notable for the following components:   Troponin I 0.03 (*)    All other components within normal limits  CBC  BRAIN NATRIURETIC PEPTIDE  PROTIME-INR  TROPONIN I    Pertinent labs  results that were available during my care of the patient were reviewed by me and considered in my medical decision making (see chart for details). ____________________________________________  EKG  I personally interpreted any EKGs ordered by me or triage Sinus tachycardia LAD RBB, borderline LVH, no acute ST elevation or depression, no significant change from prior ____________________________________________  RADIOLOGY  Pertinent labs & imaging results that were available during my care of the patient were reviewed by me and considered in my medical decision making (see chart for details). If  possible, patient and/or family made aware of any abnormal findings.  Dg Chest 2 View  Result Date: 02/03/2018 CLINICAL DATA:  One day history of  mid chest pain. Two day history of shortness of breath. Onset of headache this morning. History of COPD, sarcoidosis, CHF, coronary artery disease, current smoker. EXAM: CHEST - 2 VIEW COMPARISON:  PA and lateral chest x-ray of July 15, 2016 FINDINGS: There is chronic pleuroparenchymal scarring bilaterally. There is mild chronic elevation of the left hemidiaphragm. There is no alveolar infiltrate. The heart and pulmonary vascularity are normal. The mediastinum is normal in width. The bony thorax exhibits no acute abnormality. IMPRESSION: Chronic stable appearing pleuroparenchymal changes bilaterally consistent with known sarcoidosis, COPD, and the patient's smoking history. No acute cardiopulmonary abnormality. Electronically Signed   By: David  Martinique M.D.   On: 02/03/2018 15:32   ____________________________________________    PROCEDURES  Procedure(s) performed: None  Procedures  Critical Care performed: None  ____________________________________________   INITIAL IMPRESSION / ASSESSMENT AND PLAN / ED COURSE  Pertinent labs & imaging results that were available during my care of the patient were reviewed by me and considered in my medical decision making (see chart for details).  Patient here with hypoxia, does have history of CHF and COPD, has been intermittently compliant with his medications has some of the stigmata of CHF, this certainly could be the result of heart failure and we are evaluating that, however, he is significantly hypoxic over his baseline and has an atypical chest pain I think this warrants CT scan with a negative chest x-ray.  We will send him for a PE call.  Initial troponin is negative despite 3 days of discomfort.  EKG does not show significant change to me although at baseline it is apparent.  We are giving him  albuterol, Solu-Medrol to treat his COPD, and we will continually monitor him.  At this time, he is 100% on a nonrebreather, will wean him down as quickly as we can given his likely habituation to hypoxia at home.  Patient will require admission to the hospital as soon as we can figure out the rest of what is causing him these problems.  Blood work is reassuring    ____________________________________________   FINAL CLINICAL IMPRESSION(S) / ED DIAGNOSES  Final diagnoses:  None      This chart was dictated using voice recognition software.  Despite best efforts to proofread,  errors can occur which can change meaning.      Schuyler Amor, MD 02/03/18 [phone]

## 2018-02-03 NOTE — ED Triage Notes (Signed)
Chest tightness X 3 days, SOB X 2 weeks and headache that started today. Pt alert and oriented X4, active, cooperative, pt in NAD. RR even and unlabored, color WNL.

## 2018-02-03 NOTE — ED Notes (Signed)
Pt placed on 4L nasal cannula at this time. Pt's O2 currently at 94% on 4L and maintaining. MD aware.

## 2018-02-04 DIAGNOSIS — J9602 Acute respiratory failure with hypercapnia: Secondary | ICD-10-CM

## 2018-02-04 LAB — GLUCOSE, CAPILLARY
GLUCOSE-CAPILLARY: 206 mg/dL — AB (ref 70–99)
Glucose-Capillary: 167 mg/dL — ABNORMAL HIGH (ref 70–99)
Glucose-Capillary: 131 mg/dL — ABNORMAL HIGH (ref 70–99)
Glucose-Capillary: 138 mg/dL — ABNORMAL HIGH (ref 70–99)
Glucose-Capillary: 240 mg/dL — ABNORMAL HIGH (ref 70–99)

## 2018-02-04 LAB — BLOOD GAS, ARTERIAL
Acid-Base Excess: 9.1 mmol/L — ABNORMAL HIGH (ref 0.0–2.0)
Bicarbonate: 40.6 mmol/L — ABNORMAL HIGH (ref 20.0–28.0)
FIO2: 0.28
O2 SAT: 89.9 %
Patient temperature: 37
pCO2 arterial: 97 mmHg (ref 32.0–48.0)
pH, Arterial: 7.23 — ABNORMAL LOW (ref 7.350–7.450)
pO2, Arterial: 69 mmHg — ABNORMAL LOW (ref 83.0–108.0)

## 2018-02-04 LAB — COMPREHENSIVE METABOLIC PANEL
ALT: 22 U/L (ref 0–44)
AST: 22 U/L (ref 15–41)
Albumin: 4 g/dL (ref 3.5–5.0)
Alkaline Phosphatase: 79 U/L (ref 38–126)
Anion gap: 4 — ABNORMAL LOW (ref 5–15)
BUN: 20 mg/dL (ref 6–20)
CO2: 39 mmol/L — ABNORMAL HIGH (ref 22–32)
CREATININE: 1.38 mg/dL — AB (ref 0.61–1.24)
Calcium: 8.7 mg/dL — ABNORMAL LOW (ref 8.9–10.3)
Chloride: 96 mmol/L — ABNORMAL LOW (ref 98–111)
GFR calc Af Amer: >60 mL/min (ref >60)
Glucose, Bld: 240 mg/dL — ABNORMAL HIGH (ref 70–99)
Potassium: 5.3 mmol/L — ABNORMAL HIGH (ref 3.5–5.1)
Sodium: 139 mmol/L (ref 135–145)
Total Bilirubin: 0.6 mg/dL (ref 0.3–1.2)
Total Protein: 7.7 g/dL (ref 6.5–8.1)

## 2018-02-04 LAB — HEMOGLOBIN A1C
Hgb A1c MFr Bld: 6.1 % — ABNORMAL HIGH (ref 4.8–5.6)
Mean Plasma Glucose: 128.37 mg/dL

## 2018-02-04 LAB — MRSA PCR SCREENING: MRSA by PCR: NEGATIVE

## 2018-02-04 MED ORDER — HYDRALAZINE HCL 25 MG PO TABS
25.0000 mg | ORAL_TABLET | Freq: Three times a day (TID) | ORAL | Status: DC
  Administered 2018-02-04 – 2018-02-05 (×2): 25 mg via ORAL
  Filled 2018-02-04 (×6): qty 1

## 2018-02-04 MED ORDER — INSULIN ASPART 100 UNIT/ML ~~LOC~~ SOLN
0.0000 [IU] | Freq: Three times a day (TID) | SUBCUTANEOUS | Status: DC
  Administered 2018-02-04 (×2): 1 [IU] via SUBCUTANEOUS
  Administered 2018-02-04 – 2018-02-05 (×4): 3 [IU] via SUBCUTANEOUS
  Administered 2018-02-06: 5 [IU] via SUBCUTANEOUS
  Filled 2018-02-04 (×7): qty 1

## 2018-02-04 MED ORDER — IPRATROPIUM-ALBUTEROL 0.5-2.5 (3) MG/3ML IN SOLN: 3.0000 mL | Freq: Four times a day (QID) | RESPIRATORY_TRACT | Status: DC

## 2018-02-04 MED ORDER — INSULIN ASPART 100 UNIT/ML ~~LOC~~ SOLN
0.0000 [IU] | Freq: Every day | SUBCUTANEOUS | Status: DC
  Administered 2018-02-04 – 2018-02-05 (×2): 2 [IU] via SUBCUTANEOUS
  Filled 2018-02-04 (×2): qty 1

## 2018-02-04 MED ORDER — IPRATROPIUM-ALBUTEROL 0.5-2.5 (3) MG/3ML IN SOLN
3.0000 mL | RESPIRATORY_TRACT | Status: DC
  Administered 2018-02-04 – 2018-02-06 (×11): 3 mL via RESPIRATORY_TRACT
  Filled 2018-02-04 (×12): qty 3

## 2018-02-04 MED ORDER — HYDRALAZINE HCL 20 MG/ML IJ SOLN: 10.0000 mg | Freq: Four times a day (QID) | INTRAMUSCULAR | Status: DC | PRN

## 2018-02-04 NOTE — Progress Notes (Signed)
Called into room by wife around 11:10 for concerning behavior from patient. When I entered the room patient was sitting on the side of the bed with eyes closed swaying back and forth. Patient did not respond verbally when name was called. Wife states patient was saying things that did not make sense. With this new onset confusion respiratory was contacted to put patient back on bipap. Blood sugar checked. Dr. Bridgett Larsson notified. Vital signs taken. Oxygen on 4L was 81%. ABG ordered by dr. Bridgett Larsson. Respiratory at bedside to collect. Patient was placed on Bipap following ABG collection. Sats fluctuating between 83% and 85%. Patient continues to not be arousable. Respiratory called with ABG results. I called results to Dr. Bridgett Larsson who ordered transfer to ICU. Wife at bedside. Notified her of the transfer. ICU charge nurse called for a bed. Dr. Bridgett Larsson at bedside right before patient was transferred. Patient was beginning to follow some commands. Patient transferred on 4L [place]. Bedside report given to [facility] in ICU.

## 2018-02-04 NOTE — Progress Notes (Signed)
[place] at [place] NAME: [name]    MR#:  [id number]  DATE OF BIRTH:  1970/08/18  SUBJECTIVE:  CHIEF COMPLAINT:   Chief Complaint  Patient presents with  . Chest Pain  . Headache  . Shortness of Breath   The patient is found lethargic and less responsive, he was on 4 L oxygen by nasal cannula, put on BiPAP.  ABG showed PCO2 97, PO2 69, PH 7.23. REVIEW OF SYSTEMS:  Review of Systems  Unable to perform ROS: Mental status change    DRUG ALLERGIES:   Allergies  Allergen Reactions  . Penicillins Anaphylaxis and Other (See Comments)    Has patient had a PCN reaction causing immediate rash, facial/tongue/throat swelling, SOB or lightheadedness with hypotension: Yes Has patient had a PCN reaction causing severe rash involving mucus membranes or skin necrosis: No Has patient had a PCN reaction that required hospitalization: No Has patient had a PCN reaction occurring within the last 10 years: No If all of the above answers are "NO", then may proceed with Cephalosporin use.    VITALS:  Blood pressure (P) 106/66, pulse (P) 78, temperature (!) (P) 97.1 F (36.2 C), temperature source (P) Axillary, resp. rate (!) (P) 24, height 6\' 1"  (1.854 m), weight 126.6 kg, SpO2 (P) 93 %. PHYSICAL EXAMINATION:  Physical Exam Constitutional:      Appearance: He is obese.  HENT:     Head: Normocephalic.  Eyes:     General: No scleral icterus.    Conjunctiva/sclera: Conjunctivae normal.     Pupils: Pupils are equal, round, and reactive to light.  Neck:     Musculoskeletal: Neck supple.     Vascular: No JVD.     Trachea: No tracheal deviation.  Cardiovascular:     Rate and Rhythm: Normal rate and regular rhythm.     Heart sounds: Normal heart sounds. No murmur. No gallop.   Pulmonary:     Effort: Pulmonary effort is normal. No respiratory distress.     Breath sounds: Normal breath sounds. No stridor. No wheezing, rhonchi or rales.   Comments: Diminished breath sounds. Abdominal:     General: Bowel sounds are normal. There is no distension.     Palpations: Abdomen is soft.     Tenderness: There is no abdominal tenderness. There is no rebound.  Musculoskeletal: Normal range of motion.        General: No tenderness.     Right lower leg: Edema present.     Left lower leg: Edema present.  Skin:    Findings: No erythema or rash.  Neurological:     Cranial Nerves: No cranial nerve deficit.     Comments: The patient is lethargic and unable to exam.    LABORATORY PANEL:  Male CBC Recent Labs  Lab 02/03/18 1505  WBC 6.3  HGB 15.9  HCT 52.0  PLT 227   ------------------------------------------------------------------------------------------------------------------ Chemistries  Recent Labs  Lab 02/04/18 0400  NA 139  K 5.3*  CL 96*  CO2 39*  GLUCOSE 240*  BUN 20  CREATININE 1.38*  CALCIUM 8.7*  AST 22  ALT 22  ALKPHOS 79  BILITOT 0.6   RADIOLOGY:  Dg Chest 2 View  Result Date: 02/03/2018 CLINICAL DATA:  One day history of mid chest pain. Two day history of shortness of breath. Onset of headache this morning. History of COPD, sarcoidosis, CHF, coronary artery disease, current smoker. EXAM: CHEST - 2 VIEW COMPARISON:  PA  and lateral chest x-ray of July 15, 2016 FINDINGS: There is chronic pleuroparenchymal scarring bilaterally. There is mild chronic elevation of the left hemidiaphragm. There is no alveolar infiltrate. The heart and pulmonary vascularity are normal. The mediastinum is normal in width. The bony thorax exhibits no acute abnormality. IMPRESSION: Chronic stable appearing pleuroparenchymal changes bilaterally consistent with known sarcoidosis, COPD, and the patient's smoking history. No acute cardiopulmonary abnormality. Electronically Signed   By: David  Martinique M.D.   On: 02/03/2018 15:32   Ct Angio Chest Pe W And/or Wo Contrast  Result Date: 02/03/2018 CLINICAL DATA:  Shortness of breath,  chest tightness. EXAM: CT ANGIOGRAPHY CHEST WITH CONTRAST TECHNIQUE: Multidetector CT imaging of the chest was performed using the standard protocol during bolus administration of intravenous contrast. Multiplanar CT image reconstructions and MIPs were obtained to evaluate the vascular anatomy. CONTRAST:  97mL ISOVUE-370 IOPAMIDOL (ISOVUE-370) INJECTION 76% COMPARISON:  None. FINDINGS: Cardiovascular: No filling defects in the pulmonary arteries to suggest pulmonary emboli. Heart is borderline in size. Aorta is normal caliber. Mediastinum/Nodes: No mediastinal, hilar, or axillary adenopathy. Lungs/Pleura: Chronic areas of scarring and architectural distortion throughout the lungs bilaterally, stable. It has compatible with known sarcoidosis. No effusions or new airspace opacities. Upper Abdomen: Imaging into the upper abdomen shows no acute findings. Musculoskeletal: Mild bilateral gynecomastia. No acute bony abnormality. Review of the MIP images confirms the above findings. IMPRESSION: Chronic changes of sarcoidosis within the lungs with areas of scarring and architectural distortion. This is stable since prior study. No evidence of pulmonary embolus. Borderline heart size. Electronically Signed   By: Rolm Baptise M.D.   On: 02/03/2018 19:50   ASSESSMENT AND PLAN:   Patient is 47 year old with history of COPD and sarcoidosis presenting with shortness of breath  1.  Acute respiratory failure with hypoxia and hypercapnia due to combination of COPD exacerbation, sleep apnea and sarcoidosis. The patient will be transferred to stepdown unit, put on BiPAP, continue nebulizer therapy, Solu-Medrol, Pulmicort.  Intensivist consult.  Acute metabolic encephalopathy due to above. Aspiration precaution.  2.  History of diastolic CHF ejection fraction 60% Continue IV Lasix, follow-up BMP.3.  Sleep apnea continue BiPAP at nighttime  4.  Diabetes type 2 continue Levemir and sliding scale insulin  5.  History  of atrial fibrillation, continue Eliquis  6.  Chronic kidney disease stage III,  monitor renal function  7.  Nicotine abuse smoking cessation provided strongly recommend he stop smoking he does not want a nicotine patch  Morbid obesity.  Diet control and follow-up PCP.  I discussed with ICU intensivist Dr. Jefferson Fuel. All the records are reviewed and case discussed with Care Management/Social Worker. Management plans discussed with the patient, his wife and they are in agreement.  CODE STATUS: Full Code  TOTAL CRITICAL TIME TAKING CARE OF THIS PATIENT: 45 minutes.   More than 50% of the time was spent in counseling/coordination of care: YES  POSSIBLE D/C IN ? DAYS, DEPENDING ON CLINICAL CONDITION.   Demetrios Loll M.D on 02/04/2018 at 12:24 PM  Between 7am to 6pm - Pager - [phone]  After 6pm go to [email or web address] - [employer] [facility]

## 2018-02-04 NOTE — Consult Note (Signed)
Reason for Consult: Respiratory failure Referring Physician: Dr. Nolene [name] is an 47 y.o. male.  HPI: [name] is a 47 year old African-American male with a past medical history remarkable for hypertension, hyperlipidemia, prior CVA, obstructive pulmonary disease, coronary artery disease, congestive heart failure, sarcoidosis, was admitted yesterday with complaints of increasing shortness of breath for 2-day..  Patient uses supplemental oxygen 2 L at home along with BiPAP for his sleep apnea.  Patient has also been complaining of some lower extremity edema.  CT scan of the chest performed in the emergency department was negative for thromboembolic disease.  Changes with chronic parenchymal scarring and distortion along with some mediastinal adenopathy consistent with his diagnosis of sarcoidosis.  He was subsequently admitted to the floor and developed progressive hypercapnia and somnolence requiring BiPAP and transferred to the intensive care unit  Past Medical History:  Diagnosis Date  . CHF (congestive heart failure) ([place])   . COPD (chronic obstructive pulmonary disease) ([place])   . Coronary artery disease   . Diabetes mellitus without complication ([place])   . Hypercholesteremia unk  . Hypertension   . Sarcoidosis   . Sleep apnea    does not use CPAP regularly.  . Stroke [facility])     Past Surgical History:  Procedure Laterality Date  . APPENDECTOMY    . PARTIAL NEPHRECTOMY      Family History  Problem Relation Age of Onset  . Hypertension Mother   . Heart disease Mother   . Diabetes Mother   . Cancer Father     Social History:  reports that he has been smoking cigarettes. He has a 6.25 pack-year smoking history. He has never used smokeless tobacco. He reports current alcohol use. He reports current drug use. Drug: Marijuana.  Allergies:  Allergies  Allergen Reactions  . Penicillins Anaphylaxis and Other (See Comments)    Has patient had a PCN reaction causing  immediate rash, facial/tongue/throat swelling, SOB or lightheadedness with hypotension: Yes Has patient had a PCN reaction causing severe rash involving mucus membranes or skin necrosis: No Has patient had a PCN reaction that required hospitalization: No Has patient had a PCN reaction occurring within the last 10 years: No If all of the above answers are "NO", then may proceed with Cephalosporin use.     Medications: I have reviewed the patient's current medications.  Results for orders placed or performed during the hospital encounter of 02/03/18 (from the past 48 hour(s))  Basic metabolic panel     Status: Abnormal   Collection Time: 02/03/18  3:05 PM  Result Value Ref Range   Sodium 139 135 - 145 mmol/L   Potassium 3.7 3.5 - 5.1 mmol/L   Chloride 96 (L) 98 - 111 mmol/L   CO2 37 (H) 22 - 32 mmol/L   Glucose, Bld 168 (H) 70 - 99 mg/dL   BUN 16 6 - 20 mg/dL   Creatinine, Ser 1.39 (H) 0.61 - 1.24 mg/dL   Calcium 8.7 (L) 8.9 - 10.3 mg/dL   GFR calc non Af Amer 60 (L) >60 mL/min   GFR calc Af Amer >60 >60 mL/min   Anion gap 6 5 - 15    Comment: Performed at [facility], [address] [place], [place] [address]  CBC     Status: None   Collection Time: 02/03/18  3:05 PM  Result Value Ref Range   WBC 6.3 4.0 - 10.5 K/uL   RBC 5.73 4.22 - 5.81 MIL/uL   Hemoglobin  15.9 13.0 - 17.0 g/dL   HCT 52.0 39.0 - 52.0 %   MCV 90.8 80.0 - 100.0 fL   MCH 27.7 26.0 - 34.0 pg   MCHC 30.6 30.0 - 36.0 g/dL   RDW 12.8 11.5 - 15.5 %   Platelets 227 150 - 400 K/uL   nRBC 0.0 0.0 - 0.2 %    Comment: Performed at [facility], [address] [place], [place] [address]  Troponin I - ONCE - STAT     Status: Abnormal   Collection Time: 02/03/18  3:05 PM  Result Value Ref Range   Troponin I 0.03 (HH) <0.03 ng/mL    Comment: CRITICAL RESULT CALLED TO, READ BACK BY AND VERIFIED WITH AMY COYNE AT [place] ON 02/03/18 BY SNJ Performed at [facility], [address]  [place], [place] [address]   Brain natriuretic peptide     Status: None   Collection Time: 02/03/18  3:05 PM  Result Value Ref Range   B Natriuretic Peptide 22.0 0.0 - 100.0 pg/mL    Comment: Performed at [facility], [address] [place], [place] [address]  Protime-INR     Status: None   Collection Time: 02/03/18  6:43 PM  Result Value Ref Range   Prothrombin Time 13.0 11.4 - 15.2 seconds   INR 0.99     Comment: Performed at [facility], [address] [place], [place] [address]  Troponin I - ONCE - STAT     Status: Abnormal   Collection Time: 02/03/18  6:43 PM  Result Value Ref Range   Troponin I 0.04 (HH) <0.03 ng/mL    Comment: CRITICAL VALUE NOTED. VALUE IS CONSISTENT WITH PREVIOUSLY REPORTED/CALLED VALUE SNJ Performed at [facility], [address] [place], [place] [address]   Glucose, capillary     Status: Abnormal   Collection Time: 02/03/18 11:26 PM  Result Value Ref Range   Glucose-Capillary 189 (H) 70 - 99 mg/dL  MRSA PCR Screening     Status: None   Collection Time: 02/04/18  2:33 AM  Result Value Ref Range   MRSA by PCR NEGATIVE NEGATIVE    Comment:        The GeneXpert MRSA Assay (FDA approved for NASAL specimens only), is one component of a comprehensive MRSA colonization surveillance program. It is not intended to diagnose MRSA infection nor to guide or monitor treatment for MRSA infections. Performed at [facility], [address] [place], [place] [address]   Comprehensive metabolic panel     Status: Abnormal   Collection Time: 02/04/18  4:00 AM  Result Value Ref Range   Sodium 139 135 - 145 mmol/L   Potassium 5.3 (H) 3.5 - 5.1 mmol/L   Chloride 96 (L) 98 - 111 mmol/L   CO2 39 (H) 22 - 32 mmol/L   Glucose, Bld 240 (H) 70 - 99 mg/dL   BUN 20 6 - 20 mg/dL   Creatinine, Ser 1.38 (H) 0.61 - 1.24 mg/dL   Calcium 8.7 (L) 8.9 - 10.3 mg/dL   Total Protein 7.7 6.5 - 8.1 g/dL   Albumin 4.0 3.5 - 5.0 g/dL   AST 22  15 - 41 U/L   ALT 22 0 - 44 U/L   Alkaline Phosphatase 79 38 - 126 U/L   Total Bilirubin 0.6 0.3 - 1.2 mg/dL   GFR calc non Af Amer >60 >60 mL/min   GFR calc Af Amer >60 >60 mL/min   Anion gap  4 (L) 5 - 15    Comment: Performed at [facility], [address] [place], [place] [address]  Hemoglobin A1c     Status: Abnormal   Collection Time: 02/04/18  8:07 AM  Result Value Ref Range   Hgb A1c MFr Bld 6.1 (H) 4.8 - 5.6 %    Comment: (NOTE) Pre diabetes:          5.7%-6.4% Diabetes:              >6.4% Glycemic control for   <7.0% adults with diabetes    Mean Plasma Glucose 128.37 mg/dL    Comment: Performed at [facility] [address], [place], [place] [address]  Glucose, capillary     Status: Abnormal   Collection Time: 02/04/18  8:33 AM  Result Value Ref Range   Glucose-Capillary 206 (H) 70 - 99 mg/dL  Glucose, capillary     Status: Abnormal   Collection Time: 02/04/18 11:24 AM  Result Value Ref Range   Glucose-Capillary 167 (H) 70 - 99 mg/dL  Blood gas, arterial     Status: Abnormal   Collection Time: 02/04/18 11:35 AM  Result Value Ref Range   FIO2 0.28    Delivery systems NASAL CANNULA    pH, Arterial 7.23 (L) 7.350 - 7.450   pCO2 arterial 97 (HH) 32.0 - 48.0 mmHg    Comment: CRITICAL RESULT CALLED TO, READ BACK BY AND VERIFIED WITH: CRITICAL VALUE CALLED TO DR. CHEN 02/04/18,1145 FD    pO2, Arterial 69 (L) 83.0 - 108.0 mmHg   Bicarbonate 40.6 (H) 20.0 - 28.0 mmol/L   Acid-Base Excess 9.1 (H) 0.0 - 2.0 mmol/L   O2 Saturation 89.9 %   Patient temperature 37.0    Collection site RIGHT RADIAL    Sample type ARTERIAL DRAW     Comment: Performed at [facility], [address] [place], [place] [address]  Glucose, capillary     Status: Abnormal   Collection Time: 02/04/18 12:28 PM  Result Value Ref Range   Glucose-Capillary 131 (H) 70 - 99 mg/dL    Dg Chest 2 View  Result Date: 02/03/2018 CLINICAL DATA:  One day history of mid chest  pain. Two day history of shortness of breath. Onset of headache this morning. History of COPD, sarcoidosis, CHF, coronary artery disease, current smoker. EXAM: CHEST - 2 VIEW COMPARISON:  PA and lateral chest x-ray of July 15, 2016 FINDINGS: There is chronic pleuroparenchymal scarring bilaterally. There is mild chronic elevation of the left hemidiaphragm. There is no alveolar infiltrate. The heart and pulmonary vascularity are normal. The mediastinum is normal in width. The bony thorax exhibits no acute abnormality. IMPRESSION: Chronic stable appearing pleuroparenchymal changes bilaterally consistent with known sarcoidosis, COPD, and the patient's smoking history. No acute cardiopulmonary abnormality. Electronically Signed   By: David  Martinique M.D.   On: 02/03/2018 15:32   Ct Angio Chest Pe W And/or Wo Contrast  Result Date: 02/03/2018 CLINICAL DATA:  Shortness of breath, chest tightness. EXAM: CT ANGIOGRAPHY CHEST WITH CONTRAST TECHNIQUE: Multidetector CT imaging of the chest was performed using the standard protocol during bolus administration of intravenous contrast. Multiplanar CT image reconstructions and MIPs were obtained to evaluate the vascular anatomy. CONTRAST:  26mL ISOVUE-370 IOPAMIDOL (ISOVUE-370) INJECTION 76% COMPARISON:  None. FINDINGS: Cardiovascular: No filling defects in the pulmonary arteries to suggest pulmonary emboli. Heart is borderline in size. Aorta is normal caliber. Mediastinum/Nodes: No mediastinal, hilar, or axillary adenopathy. Lungs/Pleura: Chronic areas of scarring  and architectural distortion throughout the lungs bilaterally, stable. It has compatible with known sarcoidosis. No effusions or new airspace opacities. Upper Abdomen: Imaging into the upper abdomen shows no acute findings. Musculoskeletal: Mild bilateral gynecomastia. No acute bony abnormality. Review of the MIP images confirms the above findings. IMPRESSION: Chronic changes of sarcoidosis within the lungs with areas  of scarring and architectural distortion. This is stable since prior study. No evidence of pulmonary embolus. Borderline heart size. Electronically Signed   By: Rolm Baptise M.D.   On: 02/03/2018 19:50    ROS  Unable to perform review of systems as patient is sleepy on BiPAP  Blood pressure 106/66, pulse 78, temperature (!) 97.1 F (36.2 C), temperature source Axillary, resp. rate (!) 24, height 6\' 1"  (1.854 m), weight 124.7 kg, SpO2 93 %. Physical Exam Vital signs: Please see the above listed vital signs Patient is awake, somnolent but arousable on noninvasive ventilation HEENT: Limited oral exam, trachea midline, no thyromegaly appreciated, no jugular venous distention noted Cardiovascular: Regular rate and rhythm Pulmonary: Mild rhonchi appreciated Abdominal: Positive bowel sounds, soft exam Extremities: 1+ pitting edema appreciated Neurologic: No focal deficits noted, moves all extremities, somnolent but arousable Cutaneous: No rashes or lesions noted  Assessment/Plan:  Acute hypercapnic respiratory failure.  Most likely acute on chronic with a PCO2 of 97 only reducing the pH of 7.23.  Multifactorial etiology to include chronic obstructive pulmonary disease, patient with a history of sarcoidosis with unknown physiologic impairment along with OSA/OHS.  Patient typically uses BiPAP at night.  Will transfer to the intensive care unit started on noninvasive ventilation and monitor mental status and arterial blood gas closely as patient is high risk for intubation requirement.  Agree with albuterol, Atrovent, Solu-Medrol   Hyperkalemia.  Patient with mild renal insufficiency with a creatinine 1.38.  Will repeat potassium and follow closely  Hyperglycemia, scale coverage  Pretension.  Patient is on Norvasc, metoprolol and hydralazine  Atrial fibrillation chronically.  Patient is anticoagulated on Eliquis.  Controlled ventricular response at this time  Mild elevation in troponin at 0.04.   Most likely reflect supply demand ischemia  Critical care time 30 minutes  [name] 02/04/2018, 1:32 PM

## 2018-02-04 NOTE — Progress Notes (Signed)
   02/04/18 1935  Clinical Encounter Type  Visited With Patient  Visit Type Follow-up  Spiritual Encounters  Spiritual Needs Emotional   Chaplain followed up with patient whose spouse chaplain met earlier today.  Chaplain utilized active and reflective listening as patient reviewed health issues of self and spouse.  Chaplain encouraged patient to reach out as needed.

## 2018-02-04 NOTE — Progress Notes (Signed)
   02/04/18 1155  Clinical Encounter Type  Visited With Family  Visit Type Initial  Spiritual Encounters  Spiritual Needs Emotional   Encountered patient spouse in hallway, seeking ICU.  Chaplain walked spouse and support person to ICU, offering emotional support and utilizing active and reflective listening regarding her recent discharge and patient's admission today.  Chaplain will follow up later, encouraged family to reach out as needed.

## 2018-02-04 NOTE — Plan of Care (Signed)

## 2018-02-05 LAB — GLUCOSE, CAPILLARY
GLUCOSE-CAPILLARY: 239 mg/dL — AB (ref 70–99)
Glucose-Capillary: 207 mg/dL — ABNORMAL HIGH (ref 70–99)
Glucose-Capillary: 231 mg/dL — ABNORMAL HIGH (ref 70–99)

## 2018-02-05 LAB — BASIC METABOLIC PANEL
ANION GAP: 6 (ref 5–15)
BUN: 30 mg/dL — ABNORMAL HIGH (ref 6–20)
CO2: 36 mmol/L — ABNORMAL HIGH (ref 22–32)
Calcium: 8.4 mg/dL — ABNORMAL LOW (ref 8.9–10.3)
Chloride: 97 mmol/L — ABNORMAL LOW (ref 98–111)
Creatinine, Ser: 1.55 mg/dL — ABNORMAL HIGH (ref 0.61–1.24)
GFR calc Af Amer: >60 mL/min (ref >60)
GFR, EST NON AFRICAN AMERICAN: 53 mL/min — AB (ref >60)
GLUCOSE: 209 mg/dL — AB (ref 70–99)
Potassium: 5.2 mmol/L — ABNORMAL HIGH (ref 3.5–5.1)
Sodium: 139 mmol/L (ref 135–145)

## 2018-02-05 LAB — CBC
HCT: 51.7 % (ref 39.0–52.0)
Hemoglobin: 15.2 g/dL (ref 13.0–17.0)
MCH: 27.6 pg (ref 26.0–34.0)
MCHC: 29.4 g/dL — ABNORMAL LOW (ref 30.0–36.0)
MCV: 93.8 fL (ref 80.0–100.0)
Platelets: 222 10*3/uL (ref 150–400)
RBC: 5.51 MIL/uL (ref 4.22–5.81)
RDW: 12.5 % (ref 11.5–15.5)
WBC: 18.5 10*3/uL — ABNORMAL HIGH (ref 4.0–10.5)
nRBC: 0 % (ref 0.0–0.2)

## 2018-02-05 LAB — HIV ANTIBODY (ROUTINE TESTING W REFLEX): HIV Screen 4th Generation wRfx: NONREACTIVE

## 2018-02-05 MED ORDER — METHYLPREDNISOLONE SODIUM SUCC 40 MG IJ SOLR
40.0000 mg | Freq: Three times a day (TID) | INTRAMUSCULAR | Status: DC
  Administered 2018-02-05 – 2018-02-06 (×2): 40 mg via INTRAVENOUS
  Filled 2018-02-05 (×2): qty 1

## 2018-02-05 NOTE — Progress Notes (Signed)
Patient received room assignment, patient updated.  Report given to receiving RN, Alli with no further questions.

## 2018-02-05 NOTE — Progress Notes (Signed)
Dr Jefferson Fuel at bedside, RN notified MD that patient's wanted to get back on bipap after breakfast to nap.  MD states that patient will transfer to floor care and can have order to be on BIPAP during nap and at bedtime.

## 2018-02-05 NOTE — Progress Notes (Signed)
[place] [facility] [place] NAME: [name]    MR#:  [id number]  DATE OF BIRTH:  23-May-1970  SUBJECTIVE:  CHIEF COMPLAINT:   Chief Complaint  Patient presents with  . Chest Pain  . Headache  . Shortness of Breath   The patient is alert awake and oriented.  He feels better and has mild shortness of breath.  On oxygen 3 L, off BiPAP. REVIEW OF SYSTEMS:  Review of Systems  Constitutional: Negative for chills, fever and malaise/fatigue.  HENT: Negative for sore throat.   Eyes: Negative for blurred vision and double vision.  Respiratory: Positive for shortness of breath. Negative for cough, hemoptysis, wheezing and stridor.   Cardiovascular: Negative for chest pain, palpitations, orthopnea and leg swelling.  Gastrointestinal: Negative for abdominal pain, blood in stool, diarrhea, melena, nausea and vomiting.  Genitourinary: Negative for dysuria, flank pain and hematuria.  Musculoskeletal: Negative for back pain and joint pain.  Skin: Negative for rash.  Neurological: Negative for dizziness, sensory change, focal weakness, seizures, loss of consciousness, weakness and headaches.  Endo/Heme/Allergies: Negative for polydipsia.  Psychiatric/Behavioral: Negative for depression. The patient is not nervous/anxious.     DRUG ALLERGIES:   Allergies  Allergen Reactions  . Penicillins Anaphylaxis and Other (See Comments)    Has patient had a PCN reaction causing immediate rash, facial/tongue/throat swelling, SOB or lightheadedness with hypotension: Yes Has patient had a PCN reaction causing severe rash involving mucus membranes or skin necrosis: No Has patient had a PCN reaction that required hospitalization: No Has patient had a PCN reaction occurring within the last 10 years: No If all of the above answers are "NO", then may proceed with Cephalosporin use.    VITALS:  Blood pressure 105/66, pulse 75, temperature 97.7 F (36.5 C), temperature  source Oral, resp. rate 16, height 6\' 1"  (1.854 m), weight 124.7 kg, SpO2 98 %. PHYSICAL EXAMINATION:  Physical Exam Constitutional:      General: He is not in acute distress.    Appearance: Normal appearance. He is obese.  HENT:     Head: Normocephalic.     Mouth/Throat:     Mouth: Mucous membranes are moist.  Eyes:     General: No scleral icterus.    Conjunctiva/sclera: Conjunctivae normal.     Pupils: Pupils are equal, round, and reactive to light.  Neck:     Musculoskeletal: Neck supple.     Vascular: No JVD.     Trachea: No tracheal deviation.  Cardiovascular:     Rate and Rhythm: Normal rate and regular rhythm.     Heart sounds: Normal heart sounds. No murmur. No gallop.   Pulmonary:     Effort: Pulmonary effort is normal. No respiratory distress.     Breath sounds: Normal breath sounds. No stridor. No wheezing, rhonchi or rales.     Comments: Diminished breath sounds. Abdominal:     General: Bowel sounds are normal. There is no distension.     Palpations: Abdomen is soft.     Tenderness: There is no abdominal tenderness. There is no rebound.  Musculoskeletal: Normal range of motion.        General: No tenderness or deformity.     Right lower leg: No edema.     Left lower leg: No edema.  Skin:    Findings: No erythema or rash.  Neurological:     Mental Status: He is alert.     Cranial Nerves: No cranial nerve deficit.  Psychiatric:        Mood and Affect: Mood normal.    LABORATORY PANEL:  Male CBC Recent Labs  Lab 02/05/18 0618  WBC 18.5*  HGB 15.2  HCT 51.7  PLT 222   ------------------------------------------------------------------------------------------------------------------ Chemistries  Recent Labs  Lab 02/04/18 0400 02/05/18 0618  NA 139 139  K 5.3* 5.2*  CL 96* 97*  CO2 39* 36*  GLUCOSE 240* 209*  BUN 20 30*  CREATININE 1.38* 1.55*  CALCIUM 8.7* 8.4*  AST 22  --   ALT 22  --   ALKPHOS 79  --   BILITOT 0.6  --    RADIOLOGY:  No  results found. ASSESSMENT AND PLAN:   Patient is 48 year old with history of COPD and sarcoidosis presenting with shortness of breath  1.  Acute on chronic respiratory failure with hypoxia and hypercapnia due to combination of COPD exacerbation, sleep apnea and sarcoidosis. On oxygen 3 L by nasal cannula.  The patient is off BiPAP, continue nebulizer therapy, taper Solu-Medrol, Pulmicort.  He said he is on oxygen 2 L at home.  Acute metabolic encephalopathy due to above. Improved.  2.  History of chronic diastolic CHF ejection fraction 60% Change to p.o. Lasix, follow-up BMP.  3.  Sleep apnea continue BiPAP at nighttime  4.  Diabetes type 2 continue Levemir and sliding scale insulin  5.  History of atrial fibrillation, continue Eliquis  6.  Chronic kidney disease stage III,  monitor renal function  7.  Nicotine abuse smoking cessation provided strongly recommend he stop smoking he does not want a nicotine patch  Morbid obesity.  Diet control and follow-up PCP.  I discussed with ICU intensivist Dr. Jefferson Fuel. All the records are reviewed and case discussed with Care Management/Social Worker. Management plans discussed with the patient, his wife and they are in agreement.  CODE STATUS: Full Code  TOTAL CRITICAL TIME TAKING CARE OF THIS PATIENT: 33 minutes.   More than 50% of the time was spent in counseling/coordination of care: YES  POSSIBLE D/C IN 2 DAYS, DEPENDING ON CLINICAL CONDITION.   Demetrios Loll M.D on 02/05/2018 at 2:15 PM  Between 7am to 6pm - Pager - [phone]  After 6pm go to [email or web address] - [facility]

## 2018-02-05 NOTE — Progress Notes (Signed)
RN notified Dr Jefferson Fuel that patient has continuous order of glucagon gtt in [facility] but RN and charge RN did not get report of patient on it, no drip is in room, does not look like patient has been on it since transferred here.  Dr Jefferson Fuel ask what patient HR and BP which is HR:73 and BP 119/67.  MD gave order to discontinue glucagon gtt.

## 2018-02-05 NOTE — Progress Notes (Signed)
Follow up - Critical Care Medicine Note  Patient Details:    [name] is an 48 y.o. male.with a past medical history remarkable for hypertension, hyperlipidemia, prior CVA, obstructive pulmonary disease, coronary artery disease, congestive heart failure, sarcoidosis, was admitted yesterday with complaints of increasing shortness of breath for 2-day..  Patient uses supplemental oxygen 2 L at home along with BiPAP for his sleep apnea.  Patient has also been complaining of some lower extremity edema.  CT scan of the chest performed in the emergency department was negative for thromboembolic disease.  Changes with chronic parenchymal scarring and distortion along with some mediastinal adenopathy consistent with his diagnosis of sarcoidosis.  Admitted to the ICU for BiPAP  Lines, Airways, Drains:    Anti-infectives:  Anti-infectives (From admission, onward)   None      Microbiology: Results for orders placed or performed during the hospital encounter of 02/03/18  MRSA PCR Screening     Status: None   Collection Time: 02/04/18  2:33 AM  Result Value Ref Range Status   MRSA by PCR NEGATIVE NEGATIVE Final    Comment:        The GeneXpert MRSA Assay (FDA approved for NASAL specimens only), is one component of a comprehensive MRSA colonization surveillance program. It is not intended to diagnose MRSA infection nor to guide or monitor treatment for MRSA infections. Performed at [facility], [address] [place], [place] [address]    Studies: Dg Chest 2 View  Result Date: 02/03/2018 CLINICAL DATA:  One day history of mid chest pain. Two day history of shortness of breath. Onset of headache this morning. History of COPD, sarcoidosis, CHF, coronary artery disease, current smoker. EXAM: CHEST - 2 VIEW COMPARISON:  PA and lateral chest x-ray of July 15, 2016 FINDINGS: There is chronic pleuroparenchymal scarring bilaterally. There is mild chronic elevation of the left  hemidiaphragm. There is no alveolar infiltrate. The heart and pulmonary vascularity are normal. The mediastinum is normal in width. The bony thorax exhibits no acute abnormality. IMPRESSION: Chronic stable appearing pleuroparenchymal changes bilaterally consistent with known sarcoidosis, COPD, and the patient's smoking history. No acute cardiopulmonary abnormality. Electronically Signed   By: David  Martinique M.D.   On: 02/03/2018 15:32   Ct Angio Chest Pe W And/or Wo Contrast  Result Date: 02/03/2018 CLINICAL DATA:  Shortness of breath, chest tightness. EXAM: CT ANGIOGRAPHY CHEST WITH CONTRAST TECHNIQUE: Multidetector CT imaging of the chest was performed using the standard protocol during bolus administration of intravenous contrast. Multiplanar CT image reconstructions and MIPs were obtained to evaluate the vascular anatomy. CONTRAST:  75mL ISOVUE-370 IOPAMIDOL (ISOVUE-370) INJECTION 76% COMPARISON:  None. FINDINGS: Cardiovascular: No filling defects in the pulmonary arteries to suggest pulmonary emboli. Heart is borderline in size. Aorta is normal caliber. Mediastinum/Nodes: No mediastinal, hilar, or axillary adenopathy. Lungs/Pleura: Chronic areas of scarring and architectural distortion throughout the lungs bilaterally, stable. It has compatible with known sarcoidosis. No effusions or new airspace opacities. Upper Abdomen: Imaging into the upper abdomen shows no acute findings. Musculoskeletal: Mild bilateral gynecomastia. No acute bony abnormality. Review of the MIP images confirms the above findings. IMPRESSION: Chronic changes of sarcoidosis within the lungs with areas of scarring and architectural distortion. This is stable since prior study. No evidence of pulmonary embolus. Borderline heart size. Electronically Signed   By: Rolm Baptise M.D.   On: 02/03/2018 19:50    Consults:    Subjective:    Overnight Issues: Patient doing well.Weaned off of BiPAP  Objective:  Vital signs for last 24  hours: Temp:  [97.1 F (36.2 C)-97.6 F (36.4 C)] 97.6 F (36.4 C) (01/01 0200) Pulse Rate:  [73-91] 79 (01/01 0700) Resp:  [16-35] 28 (01/01 0700) BP: (94-128)/(63-78) 118/70 (01/01 0700) SpO2:  [83 %-100 %] 95 % (01/01 0700) FiO2 (%):  [40 %] 40 % (01/01 0338) Weight:  [124.7 kg] 124.7 kg (12/31 1223)  Hemodynamic parameters for last 24 hours:    Intake/Output from previous day: 12/31 0701 - 01/01 0700 In: 360 [P.O.:360] Out: 900 [Urine:900]  Intake/Output this shift: No intake/output data recorded.  Vent settings for last 24 hours: FiO2 (%):  [40 %] 40 %  Physical Exam:  Vital signs:       Please see the above listed vital signs Patient is awake, somnolent but arousable on noninvasive ventilation HEENT: Limited oral exam, trachea midline, no thyromegaly appreciated, no jugular venous distention noted Cardiovascular:           Regular rate and rhythm Pulmonary:      Mild rhonchi appreciated Abdominal:      Positive bowel sounds, soft exam Extremities:     1+ pitting edema appreciated Neurologic:      No focal deficits noted, moves all extremities, somnolent but arousable Cutaneous:      No rashes or lesions noted   Assessment/Plan:  Acute hypercapnic respiratory failure.    Patient did well yesterday, weaned off of BiPAP, awake alert in no acute distress.  Multifactorial etiology to include OSA/OHS/COPD and sarcoidosis.  Would continue on bronchodilators and corticosteroids  Hyperkalemia.  Patient with mild renal insufficiency with a creatinine 1.55.    Hyperglycemia, scale coverage  HTN.  Patient is on Norvasc, metoprolol and hydralazine  Atrial fibrillation chronically.  Patient is anticoagulated on Eliquis.  Controlled ventricular response at this time  Mild elevation in troponin at 0.04.  Most likely reflect supply demand ischemia  Leukocytosis.  White count is 18.5.  Patient afebrile, most likely demargination from steroids  Patient doing well at this  time, stable for floor transfer.  Will need nocturnal BiPAP.  Will need outpatient follow-up with Dr. [name], patient may need repeat BiPAP titration in the outpatient setting  [place] 02/05/2018  *Care during the described time interval was provided by me and/or other providers on the critical care team.  I have reviewed this patient's available data, including medical history, events of note, physical examination and test results as part of my evaluation.

## 2018-02-06 LAB — BASIC METABOLIC PANEL
Anion gap: 4 — ABNORMAL LOW (ref 5–15)
BUN: 32 mg/dL — AB (ref 6–20)
CALCIUM: 8.4 mg/dL — AB (ref 8.9–10.3)
CO2: 36 mmol/L — ABNORMAL HIGH (ref 22–32)
CREATININE: 1.6 mg/dL — AB (ref 0.61–1.24)
Chloride: 98 mmol/L (ref 98–111)
GFR calc Af Amer: 59 mL/min — ABNORMAL LOW (ref >60)
GFR calc non Af Amer: 51 mL/min — ABNORMAL LOW (ref >60)
Glucose, Bld: 296 mg/dL — ABNORMAL HIGH (ref 70–99)
Potassium: 5 mmol/L (ref 3.5–5.1)
Sodium: 138 mmol/L (ref 135–145)

## 2018-02-06 LAB — GLUCOSE, CAPILLARY
GLUCOSE-CAPILLARY: 237 mg/dL — AB (ref 70–99)
Glucose-Capillary: 219 mg/dL — ABNORMAL HIGH (ref 70–99)
Glucose-Capillary: 268 mg/dL — ABNORMAL HIGH (ref 70–99)

## 2018-02-06 MED ORDER — PREDNISONE 50 MG PO TABS
50.0000 mg | ORAL_TABLET | Freq: Every day | ORAL | Status: DC
  Administered 2018-02-06: 50 mg via ORAL
  Filled 2018-02-06: qty 1

## 2018-02-06 MED ORDER — GUAIFENESIN ER 600 MG PO TB12: 600.0000 mg | ORAL_TABLET | Freq: Two times a day (BID) | ORAL | 0 refills | Status: DC | PRN

## 2018-02-06 MED ORDER — PREDNISONE 20 MG PO TABS: 40.0000 mg | ORAL_TABLET | Freq: Every day | ORAL | 0 refills | Status: DC

## 2018-02-06 MED ORDER — INSULIN ASPART 100 UNIT/ML ~~LOC~~ SOLN
5.0000 [IU] | Freq: Three times a day (TID) | SUBCUTANEOUS | Status: DC
  Administered 2018-02-06: 5 [IU] via SUBCUTANEOUS
  Filled 2018-02-06: qty 1

## 2018-02-06 NOTE — Care Management Important Message (Signed)
Patient discharged prior to arrival to floor to deliver concurrent Medicare IM.  

## 2018-02-06 NOTE — Progress Notes (Signed)
Patient requested to see Clinical Social Worker (CSW) prior to his discharge home today. CSW met with patient and he asked if he could get a new bi-pap machine at home. Per patient his bi-pap is still working however it is making a "whining noise". CSW explained that patient will have to call the company that provided the bi-pap and ask them to come out and look at it. Patient verbalized his understanding and thanked CSW for visit. Patient reported no other needs or concerns.    [employer], LCSW [phone]

## 2018-02-06 NOTE — Care Management Note (Signed)
Case Management Note  Patient Details  Name: [name] MRN: [id number] Date of Birth: 1970-11-06  Subjective/Objective:    Admitted to [facility] with the diagnosis of COPD. Lives with wife, [name]). Sees Dr. Gwynneth Aliment as primary care physician. Prescriptions are filled at [facility] either on [facility] or in [place]. Home Health per [place] in the past, Skilled Nursing at a facility in [place] in the past. BiPap in the home. Chronic Home oxygen at 2 liters per nasal cannula x 5 years and is provided by [facility]. Takes care of all basic activities of daily living himself. Fair-good appetite. Family will transport                Action/Plan: No transition of care needs noted at this time.   Expected Discharge Date:                  Expected Discharge Plan:     In-House Referral:   yes  Discharge planning Services   yes  Post Acute Care Choice:    Choice offered to:     DME Arranged:    DME Agency:     HH Arranged:    HH Agency:     Status of Service:     If discussed at [employer] of Stay Meetings, dates discussed:    Additional Comments:  Shelbie Ammons, RN MSN CCM Care Management [phone] 02/06/2018, 8:40 AM

## 2018-02-06 NOTE — Discharge Instructions (Signed)
Compliance to BIPAP at night. Continue home O2 [place] 2 L. Avoid narcotics and flexeril if possible.

## 2018-02-06 NOTE — Discharge Summary (Signed)
[place] [facility] [place] NAME: [name]    MR#:  [id number]  DATE OF BIRTH:  01-31-1971  DATE OF ADMISSION:  02/03/2018   ADMITTING PHYSICIAN: Dustin Flock, MD  DATE OF DISCHARGE: 02/06/2018 12:47 PM  PRIMARY CARE PHYSICIAN: Soles, Howell Rucks, MD   ADMISSION DIAGNOSIS:  Hypoxia [R09.02] DISCHARGE DIAGNOSIS:  Active Problems:   COPD with acute exacerbation ([place])  SECONDARY DIAGNOSIS:   Past Medical History:  Diagnosis Date  . CHF (congestive heart failure) ([place])   . COPD (chronic obstructive pulmonary disease) ([place])   . Coronary artery disease   . Diabetes mellitus without complication ([place])   . Hypercholesteremia unk  . Hypertension   . Sarcoidosis   . Sleep apnea    does not use CPAP regularly.  . Stroke [facility])    HOSPITAL COURSE:  Patient is 48 year old with history of COPD and sarcoidosis presenting with shortness of breath  1.Acute on chronic respiratory failure with hypoxia and hypercapnia due to combination of COPD exacerbation, sleep apnea and sarcoidosis. On oxygen 2 L by nasal cannula.  The patient is off BiPAP, continue nebulizer therapy, tapered Solu-Medrol, changed to p.o. prednisone.  He said he is on oxygen 2 L at home.  Follow-up Dr. [name] as outpatient.  Acute metabolic encephalopathy due to above. Improved.  2.History of chronic diastolic CHF ejection fraction 60% Change to p.o. Lasix, follow-up BMP.  3.Sleep apnea continue BiPAP at nighttime  4.Diabetes type 2 continue Levemir and sliding scale insulin  5.History of atrial fibrillation, continue Eliquis  6.Chronic kidney disease stage III,   stable.  7.Nicotine abuse smoking cessation provided strongly recommend he stop smoking he does not want a nicotine patch  Morbid obesity.  Diet control and follow-up PCP. DISCHARGE CONDITIONS:  Stable, discharge to home today. CONSULTS OBTAINED:   DRUG ALLERGIES:   Allergies    Allergen Reactions  . Penicillins Anaphylaxis and Other (See Comments)    Has patient had a PCN reaction causing immediate rash, facial/tongue/throat swelling, SOB or lightheadedness with hypotension: Yes Has patient had a PCN reaction causing severe rash involving mucus membranes or skin necrosis: No Has patient had a PCN reaction that required hospitalization: No Has patient had a PCN reaction occurring within the last 10 years: No If all of the above answers are "NO", then may proceed with Cephalosporin use.    DISCHARGE MEDICATIONS:   Allergies as of 02/06/2018      Reactions   Penicillins Anaphylaxis, Other (See Comments)   Has patient had a PCN reaction causing immediate rash, facial/tongue/throat swelling, SOB or lightheadedness with hypotension: Yes Has patient had a PCN reaction causing severe rash involving mucus membranes or skin necrosis: No Has patient had a PCN reaction that required hospitalization: No Has patient had a PCN reaction occurring within the last 10 years: No If all of the above answers are "NO", then may proceed with Cephalosporin use.      Medication List    STOP taking these medications   predniSONE 10 MG (21) Tbpk tablet Commonly known as:  STERAPRED UNI-PAK 21 TAB Replaced by:  predniSONE 20 MG tablet     TAKE these medications   albuterol (2.5 MG/3ML) 0.083% nebulizer solution Commonly known as:  PROVENTIL Take 2.5 mg by nebulization every 6 (six) hours as needed for wheezing or shortness of breath.   amLODipine 5 MG tablet Commonly known as:  NORVASC Take 5 mg by mouth daily.   apixaban 5  MG Tabs tablet Commonly known as:  ELIQUIS Take 5 mg by mouth 2 (two) times daily.   aspirin 81 MG chewable tablet Chew 81 mg by mouth daily.   atorvastatin 40 MG tablet Commonly known as:  LIPITOR Take 40 mg by mouth at bedtime.   cyclobenzaprine 10 MG tablet Commonly known as:  FLEXERIL Take 1 tablet (10 mg total) by mouth 3 (three) times daily  as needed for muscle spasms.   fluticasone 50 MCG/ACT nasal spray Commonly known as:  FLONASE Place 2 sprays into both nostrils daily. What changed:    when to take this  reasons to take this   Fluticasone-Salmeterol 250-50 MCG/DOSE Aepb Commonly known as:  ADVAIR Inhale 1 puff into the lungs 2 (two) times daily.   furosemide 80 MG tablet Commonly known as:  LASIX Take 1 tablet (80 mg total) by mouth daily. What changed:  Another medication with the same name was removed. Continue taking this medication, and follow the directions you see here.   guaiFENesin 600 MG 12 hr tablet Commonly known as:  MUCINEX Take 1 tablet (600 mg total) by mouth 2 (two) times daily as needed.   insulin detemir 100 UNIT/ML injection Commonly known as:  LEVEMIR Inject 40-50 Units into the skin at bedtime.   insulin lispro 100 UNIT/ML injection Commonly known as:  HUMALOG Inject 12-15 Units into the skin 3 (three) times daily with meals. Sliding scale as needed   ipratropium-albuterol 0.5-2.5 (3) MG/3ML Soln Commonly known as:  DUONEB Take 3 mLs by nebulization 4 (four) times daily. What changed:    when to take this  reasons to take this   isosorbide dinitrate 10 MG tablet Commonly known as:  ISORDIL Take 1 tablet (10 mg total) by mouth 3 (three) times daily.   KLOR-CON M20 20 MEQ tablet Generic drug:  potassium chloride SA TAKE ONE TABLET BY MOUTH TWICE DAILY What changed:    how much to take  when to take this   lisinopril 20 MG tablet Commonly known as:  PRINIVIL,ZESTRIL Take 20 mg by mouth daily.   Melatonin 5 MG Tabs Take 5 mg by mouth at bedtime as needed for sleep.   methocarbamol 500 MG tablet Commonly known as:  ROBAXIN 1-2 tablets qid prn muscle spasms   metoprolol tartrate 50 MG tablet Commonly known as:  LOPRESSOR Take 50 mg by mouth 2 (two) times daily.   predniSONE 20 MG tablet Commonly known as:  DELTASONE Take 2 tablets (40 mg total) by mouth daily with  breakfast. Replaces:  predniSONE 10 MG (21) Tbpk tablet   theophylline 300 MG 12 hr tablet Commonly known as:  THEODUR Take 300 mg by mouth 2 (two) times daily.   tiotropium 18 MCG inhalation capsule Commonly known as:  SPIRIVA Place 18 mcg into inhaler and inhale daily.   traMADol 50 MG tablet Commonly known as:  ULTRAM Take 1 tablet (50 mg total) by mouth every 6 (six) hours as needed.        DISCHARGE INSTRUCTIONS:  See AVS.  If you experience worsening of your admission symptoms, develop shortness of breath, life threatening emergency, suicidal or homicidal thoughts you must seek medical attention immediately by calling 911 or calling your MD immediately  if symptoms less severe.  You Must read complete instructions/literature along with all the possible adverse reactions/side effects for all the Medicines you take and that have been prescribed to you. Take any new Medicines after you have completely understood and accpet  all the possible adverse reactions/side effects.   Please note  You were cared for by a hospitalist during your hospital stay. If you have any questions about your discharge medications or the care you received while you were in the hospital after you are discharged, you can call the unit and asked to speak with the hospitalist on call if the hospitalist that took care of you is not available. Once you are discharged, your primary care physician will handle any further medical issues. Please note that NO REFILLS for any discharge medications will be authorized once you are discharged, as it is imperative that you return to your primary care physician (or establish a relationship with a primary care physician if you do not have one) for your aftercare needs so that they can reassess your need for medications and monitor your lab values.    On the day of Discharge:  VITAL SIGNS:  Blood pressure 135/83, pulse 87, temperature 98.3 F (36.8 C), temperature source  Oral, resp. rate 20, height 6\' 1"  (1.854 m), weight 124.7 kg, SpO2 94 %. PHYSICAL EXAMINATION:  GENERAL:  48 y.o.-year-old patient lying in the bed with no acute distress.  Morbid obesity. EYES: Pupils equal, round, reactive to light and accommodation. No scleral icterus. Extraocular muscles intact.  HEENT: Head atraumatic, normocephalic. Oropharynx and nasopharynx clear.  NECK:  Supple, no jugular venous distention. No thyroid enlargement, no tenderness.  LUNGS: Normal breath sounds bilaterally, no wheezing, rales,rhonchi or crepitation. No use of accessory muscles of respiration.  CARDIOVASCULAR: S1, S2 normal. No murmurs, rubs, or gallops.  ABDOMEN: Soft, non-tender, non-distended. Bowel sounds present. No organomegaly or mass.  EXTREMITIES: No pedal edema, cyanosis, or clubbing.  NEUROLOGIC: Cranial nerves II through XII are intact. Muscle strength 5/5 in all extremities. Sensation intact. Gait not checked.  PSYCHIATRIC: The patient is alert and oriented x 3.  SKIN: No obvious rash, lesion, or ulcer.  DATA REVIEW:   CBC Recent Labs  Lab 02/05/18 0618  WBC 18.5*  HGB 15.2  HCT 51.7  PLT 222    Chemistries  Recent Labs  Lab 02/04/18 0400  02/06/18 0830  NA 139   < > 138  K 5.3*   < > 5.0  CL 96*   < > 98  CO2 39*   < > 36*  GLUCOSE 240*   < > 296*  BUN 20   < > 32*  CREATININE 1.38*   < > 1.60*  CALCIUM 8.7*   < > 8.4*  AST 22  --   --   ALT 22  --   --   ALKPHOS 79  --   --   BILITOT 0.6  --   --    < > = values in this interval not displayed.     Microbiology Results  Results for orders placed or performed during the hospital encounter of 02/03/18  MRSA PCR Screening     Status: None   Collection Time: 02/04/18  2:33 AM  Result Value Ref Range Status   MRSA by PCR NEGATIVE NEGATIVE Final    Comment:        The GeneXpert MRSA Assay (FDA approved for NASAL specimens only), is one component of a comprehensive MRSA colonization surveillance program. It is  not intended to diagnose MRSA infection nor to guide or monitor treatment for MRSA infections. Performed at [facility], [address], [place], [place] [address]     RADIOLOGY:  No results found.  Management plans discussed with the patient, family and they are in agreement.  CODE STATUS: Full Code   TOTAL TIME TAKING CARE OF THIS PATIENT: 32 minutes.    Demetrios Loll M.D on 02/06/2018 at 2:38 PM  Between 7am to 6pm - Pager - [phone]  After 6pm go to [email or web address] - Proofreader  [employer] [facility]  Office  [phone]  CC: Primary care physician; Herminio Commons, MD   Note: This dictation was prepared with Dragon dictation along with smaller phrase technology. Any transcriptional errors that result from this process are unintentional.

## 2018-02-07 ENCOUNTER — Other Ambulatory Visit: Payer: Self-pay | Admitting: Preventative Medicine

## 2018-02-07 DIAGNOSIS — R0602 Shortness of breath: Secondary | ICD-10-CM

## 2018-02-27 ENCOUNTER — Ambulatory Visit: Payer: Disability Insurance | Attending: Preventative Medicine

## 2018-02-27 DIAGNOSIS — R0602 Shortness of breath: Secondary | ICD-10-CM | POA: Diagnosis not present

## 2018-02-27 MED ORDER — ALBUTEROL SULFATE (2.5 MG/3ML) 0.083% IN NEBU
2.5000 mg | INHALATION_SOLUTION | Freq: Once | RESPIRATORY_TRACT | Status: AC
  Administered 2018-02-27: 2.5 mg via RESPIRATORY_TRACT
  Filled 2018-02-27: qty 3

## 2018-07-28 ENCOUNTER — Other Ambulatory Visit: Payer: Self-pay

## 2018-07-28 ENCOUNTER — Inpatient Hospital Stay
Admission: EM | Admit: 2018-07-28 | Discharge: 2018-07-30 | DRG: 190 | Disposition: A | Discharge status: Home / Self Care | Payer: Medicare Other | Attending: Internal Medicine | Admitting: Internal Medicine

## 2018-07-28 ENCOUNTER — Encounter: Payer: Self-pay | Admitting: Intensive Care

## 2018-07-28 ENCOUNTER — Emergency Department: Payer: Medicare Other

## 2018-07-28 DIAGNOSIS — D869 Sarcoidosis, unspecified: Secondary | ICD-10-CM | POA: Diagnosis present

## 2018-07-28 DIAGNOSIS — I110 Hypertensive heart disease with heart failure: Secondary | ICD-10-CM | POA: Diagnosis present

## 2018-07-28 DIAGNOSIS — Z794 Long term (current) use of insulin: Secondary | ICD-10-CM

## 2018-07-28 DIAGNOSIS — Z7951 Long term (current) use of inhaled steroids: Secondary | ICD-10-CM

## 2018-07-28 DIAGNOSIS — R0602 Shortness of breath: Secondary | ICD-10-CM | POA: Diagnosis not present

## 2018-07-28 DIAGNOSIS — G4733 Obstructive sleep apnea (adult) (pediatric): Secondary | ICD-10-CM | POA: Diagnosis present

## 2018-07-28 DIAGNOSIS — F17210 Nicotine dependence, cigarettes, uncomplicated: Secondary | ICD-10-CM | POA: Diagnosis present

## 2018-07-28 DIAGNOSIS — Z7982 Long term (current) use of aspirin: Secondary | ICD-10-CM

## 2018-07-28 DIAGNOSIS — Z20828 Contact with and (suspected) exposure to other viral communicable diseases: Secondary | ICD-10-CM | POA: Diagnosis present

## 2018-07-28 DIAGNOSIS — Z7952 Long term (current) use of systemic steroids: Secondary | ICD-10-CM

## 2018-07-28 DIAGNOSIS — Z79899 Other long term (current) drug therapy: Secondary | ICD-10-CM

## 2018-07-28 DIAGNOSIS — E119 Type 2 diabetes mellitus without complications: Secondary | ICD-10-CM | POA: Diagnosis present

## 2018-07-28 DIAGNOSIS — I480 Paroxysmal atrial fibrillation: Secondary | ICD-10-CM | POA: Diagnosis present

## 2018-07-28 DIAGNOSIS — I5032 Chronic diastolic (congestive) heart failure: Secondary | ICD-10-CM | POA: Diagnosis present

## 2018-07-28 DIAGNOSIS — Z833 Family history of diabetes mellitus: Secondary | ICD-10-CM

## 2018-07-28 DIAGNOSIS — J9621 Acute and chronic respiratory failure with hypoxia: Secondary | ICD-10-CM | POA: Diagnosis present

## 2018-07-28 DIAGNOSIS — Z880 Allergy status to penicillin: Secondary | ICD-10-CM

## 2018-07-28 DIAGNOSIS — I2510 Atherosclerotic heart disease of native coronary artery without angina pectoris: Secondary | ICD-10-CM | POA: Diagnosis present

## 2018-07-28 DIAGNOSIS — E785 Hyperlipidemia, unspecified: Secondary | ICD-10-CM | POA: Diagnosis present

## 2018-07-28 DIAGNOSIS — Z8673 Personal history of transient ischemic attack (TIA), and cerebral infarction without residual deficits: Secondary | ICD-10-CM

## 2018-07-28 DIAGNOSIS — J441 Chronic obstructive pulmonary disease with (acute) exacerbation: Principal | ICD-10-CM | POA: Diagnosis present

## 2018-07-28 DIAGNOSIS — R0902 Hypoxemia: Secondary | ICD-10-CM

## 2018-07-28 DIAGNOSIS — Z7901 Long term (current) use of anticoagulants: Secondary | ICD-10-CM

## 2018-07-28 DIAGNOSIS — Z809 Family history of malignant neoplasm, unspecified: Secondary | ICD-10-CM

## 2018-07-28 DIAGNOSIS — E7800 Pure hypercholesterolemia, unspecified: Secondary | ICD-10-CM | POA: Diagnosis present

## 2018-07-28 DIAGNOSIS — Z8249 Family history of ischemic heart disease and other diseases of the circulatory system: Secondary | ICD-10-CM

## 2018-07-28 LAB — CBC
HCT: 51.5 % (ref 39.0–52.0)
Hemoglobin: 16.4 g/dL (ref 13.0–17.0)
MCH: 27.8 pg (ref 26.0–34.0)
MCHC: 31.8 g/dL (ref 30.0–36.0)
MCV: 87.3 fL (ref 80.0–100.0)
Platelets: 196 10*3/uL (ref 150–400)
RBC: 5.9 MIL/uL — ABNORMAL HIGH (ref 4.22–5.81)
RDW: 12.8 % (ref 11.5–15.5)
WBC: 7.3 10*3/uL (ref 4.0–10.5)
nRBC: 0 % (ref 0.0–0.2)

## 2018-07-28 LAB — BASIC METABOLIC PANEL
Anion gap: 8 (ref 5–15)
BUN: 15 mg/dL (ref 6–20)
CO2: 30 mmol/L (ref 22–32)
Calcium: 8.4 mg/dL — ABNORMAL LOW (ref 8.9–10.3)
Chloride: 103 mmol/L (ref 98–111)
Creatinine, Ser: 1.26 mg/dL — ABNORMAL HIGH (ref 0.61–1.24)
GFR calc Af Amer: >60 mL/min (ref >60)
GFR calc non Af Amer: >60 mL/min (ref >60)
Glucose, Bld: 123 mg/dL — ABNORMAL HIGH (ref 70–99)
Potassium: 4.2 mmol/L (ref 3.5–5.1)
Sodium: 141 mmol/L (ref 135–145)

## 2018-07-28 LAB — TROPONIN I
Troponin I: 0.05 ng/mL (ref <0.03)
Troponin I: 0.04 ng/mL (ref <0.03)

## 2018-07-28 LAB — SARS CORONAVIRUS 2 BY RT PCR (HOSPITAL ORDER, PERFORMED IN ~~LOC~~ HOSPITAL LAB): SARS Coronavirus 2: NEGATIVE

## 2018-07-28 LAB — PROTIME-INR
INR: 1.3 — ABNORMAL HIGH (ref 0.8–1.2)
Prothrombin Time: 15.7 seconds — ABNORMAL HIGH (ref 11.4–15.2)

## 2018-07-28 MED ORDER — IPRATROPIUM-ALBUTEROL 0.5-2.5 (3) MG/3ML IN SOLN
3.0000 mL | Freq: Once | RESPIRATORY_TRACT | Status: AC
  Administered 2018-07-28: 3 mL via RESPIRATORY_TRACT
  Filled 2018-07-28: qty 3

## 2018-07-28 MED ORDER — METHYLPREDNISOLONE SODIUM SUCC 125 MG IJ SOLR
125.0000 mg | Freq: Once | INTRAMUSCULAR | Status: AC
  Administered 2018-07-28: 125 mg via INTRAVENOUS
  Filled 2018-07-28: qty 2

## 2018-07-28 NOTE — ED Notes (Signed)
Pt's nose moisturized

## 2018-07-28 NOTE — ED Notes (Signed)
This EDT took 3 packs of graham crackers and gingerale to the pt, and turned the pt's oxygen off as the RN asked, and explained to him that the RN needs to see how he breathes without it.

## 2018-07-28 NOTE — ED Provider Notes (Signed)
[facility] Emergency Department Provider Note   ____________________________________________   First MD Initiated Contact with Patient 07/28/18 1745     (approximate)  I have reviewed the triage vital signs and the nursing notes.   HISTORY  Chief Complaint Shortness of Breath    HPI [name] is a 48 y.o. male who reports some shortness of breath and chest tightness for a few days.  Is worse today.  He thinks it is his COPD acting up.  He is usually not on oxygen.  Does not remember having a fever.  He does have a temperature 99 here.         Past Medical History:  Diagnosis Date  . CHF (congestive heart failure) ([place])   . COPD (chronic obstructive pulmonary disease) ([place])   . Coronary artery disease   . Diabetes mellitus without complication ([place])   . Hypercholesteremia unk  . Hypertension   . Sarcoidosis   . Sleep apnea    does not use CPAP regularly.  . Stroke [facility])     Patient Active Problem List   Diagnosis Date Noted  . COPD with acute exacerbation ([place]) 02/03/2018  . COPD (chronic obstructive pulmonary disease) ([place]) 07/16/2016  . Chronic diastolic heart failure ([place]) 03/23/2016  . Hypercarbia   . COPD exacerbation ([place]) 11/17/2015  . HTN (hypertension) 06/14/2015  . Dental caries 03/08/2015  . Morbid obesity ([place]) 02/21/2015  . OSA and COPD overlap syndrome ([place]) 02/21/2015  . Thrombocytopenia ([place]) 02/21/2015  . Hypernatremia 02/21/2015  . Diabetes ([place]) 10/19/2014  . Chronic combined systolic and diastolic congestive heart failure ([place]) 10/18/2014  . Chronic obstructive pulmonary disease ([place]) 10/18/2014  . Sarcoidosis 04/22/2013  . Other and unspecified hyperlipidemia 04/22/2013    Past Surgical History:  Procedure Laterality Date  . APPENDECTOMY    . PARTIAL NEPHRECTOMY      Prior to Admission medications   Medication Sig Start Date End Date Taking? Authorizing Provider  albuterol (PROVENTIL) (2.5 MG/3ML)  0.083% nebulizer solution Take 2.5 mg by nebulization every 6 (six) hours as needed for wheezing or shortness of breath.    [provider]  amLODipine (NORVASC) 5 MG tablet Take 5 mg by mouth daily. 03/12/16   [provider]  apixaban (ELIQUIS) 5 MG TABS tablet Take 5 mg by mouth 2 (two) times daily. 06/11/16   [provider]  aspirin 81 MG chewable tablet Chew 81 mg by mouth daily.    [provider]  atorvastatin (LIPITOR) 40 MG tablet Take 40 mg by mouth at bedtime.     [provider]  cyclobenzaprine (FLEXERIL) 10 MG tablet Take 1 tablet (10 mg total) by mouth 3 (three) times daily as needed for muscle spasms. 10/24/17   Triplett, Cari B, FNP  fluticasone (FLONASE) 50 MCG/ACT nasal spray Place 2 sprays into both nostrils daily. Patient taking differently: Place 2 sprays into both nostrils daily as needed for allergies or rhinitis.  07/03/16   Vaughan Basta, MD  Fluticasone-Salmeterol (ADVAIR) 250-50 MCG/DOSE AEPB Inhale 1 puff into the lungs 2 (two) times daily.    [provider]  furosemide (LASIX) 80 MG tablet Take 1 tablet (80 mg total) by mouth daily. 05/10/16   Alisa Graff, FNP  guaiFENesin (MUCINEX) 600 MG 12 hr tablet Take 1 tablet (600 mg total) by mouth 2 (two) times daily as needed. 02/06/18   Demetrios Loll, MD  insulin detemir (LEVEMIR) 100 UNIT/ML injection Inject 40-50 Units into the skin  at bedtime.     [provider]  insulin lispro (HUMALOG) 100 UNIT/ML injection Inject 12-15 Units into the skin 3 (three) times daily with meals. Sliding scale as needed    [provider]  ipratropium-albuterol (DUONEB) 0.5-2.5 (3) MG/3ML SOLN Take 3 mLs by nebulization 4 (four) times daily. Patient taking differently: Take 3 mLs by nebulization 4 (four) times daily as needed (shortness of breath / wheezing).  02/21/15   Theodoro Grist, MD  isosorbide dinitrate (ISORDIL) 10 MG tablet Take 1 tablet (10 mg total) by mouth 3  (three) times daily. 04/01/15   Hackney, Tina A, FNP  KLOR-CON M20 20 MEQ tablet TAKE ONE TABLET BY MOUTH TWICE DAILY Patient taking differently: Take 20 mEq by mouth daily.  08/20/16   Alisa Graff, FNP  lisinopril (PRINIVIL,ZESTRIL) 20 MG tablet Take 20 mg by mouth daily.     [provider]  Melatonin 5 MG TABS Take 5 mg by mouth at bedtime as needed for sleep.    [provider]  methocarbamol (ROBAXIN) 500 MG tablet 1-2 tablets qid prn muscle spasms 07/22/16   Letitia Neri L, PA-C  metoprolol tartrate (LOPRESSOR) 50 MG tablet Take 50 mg by mouth 2 (two) times daily.     [provider]  predniSONE (DELTASONE) 20 MG tablet Take 2 tablets (40 mg total) by mouth daily with breakfast. 02/06/18   Demetrios Loll, MD  theophylline (THEODUR) 300 MG 12 hr tablet Take 300 mg by mouth 2 (two) times daily.     [provider]  tiotropium (SPIRIVA) 18 MCG inhalation capsule Place 18 mcg into inhaler and inhale daily.    [provider]  traMADol (ULTRAM) 50 MG tablet Take 1 tablet (50 mg total) by mouth every 6 (six) hours as needed. 10/24/17   Victorino Dike, FNP    Allergies Penicillins  Family History  Problem Relation Age of Onset  . Hypertension Mother   . Heart disease Mother   . Diabetes Mother   . Cancer Father     Social History Social History   Tobacco Use  . Smoking status: Current Every Day Smoker    Packs/day: 0.25    Years: 25.00    Pack years: 6.25    Types: Cigarettes    Last attempt to quit: 02/05/2015    Years since quitting: 3.4  . Smokeless tobacco: Never Used  Substance Use Topics  . Alcohol use: Yes    Alcohol/week: 0.0 standard drinks    Comment: occassional  . Drug use: Yes    Types: Marijuana    Comment: last use December 2016    Review of Systems  Constitutional: No fever/chills Eyes: No visual changes. ENT: No sore throat. Cardiovascular: Denies chest pain. Respiratory:shortness of breath.  Gastrointestinal: No abdominal pain.  No nausea, no vomiting.  No diarrhea.  No constipation. Genitourinary: Negative for dysuria. Musculoskeletal: Negative for back pain. Skin: Negative for rash. Neurological: Negative for headaches, focal weakness  ____________________________________________   PHYSICAL EXAM:  VITAL SIGNS: ED Triage Vitals  Enc Vitals Group     BP 07/28/18 1444 (!) 135/95     Pulse Rate 07/28/18 1444 84     Resp 07/28/18 1444 (!) 22     Temp 07/28/18 1444 99.1 F (37.3 C)     Temp Source 07/28/18 1444 Oral     SpO2 07/28/18 1444 (!) 87 %     Weight 07/28/18 1445 279 lb 8 oz (126.8 kg)  Height 07/28/18 1445 6' 0.5" (1.842 m)     Head Circumference --      Peak Flow --      Pain Score 07/28/18 1445 0     Pain Loc --      Pain Edu? --      Excl. in [place]? --     Constitutional: Alert and oriented. Well appearing and in no acute distress. Eyes: Conjunctivae are normal.  Head: Atraumatic. Nose: No congestion/rhinnorhea. Mouth/Throat: Mucous membranes are moist.  Oropharynx non-erythematous. Neck: No stridor. Cardiovascular: Normal rate, regular rhythm. Grossly normal heart sounds.  Good peripheral circulation. Respiratory: Normal respiratory effort.  No retractions. Lungs CTAB.  Patient does have prolonged expiratory phase. Gastrointestinal: Soft and nontender. No distention. No abdominal bruits. No CVA tenderness. Musculoskeletal: No lower extremity tenderness nor edema.   Neurologic:  Normal speech and language. No gross focal neurologic deficits are appreciated. No gait instability. Skin:  Skin is warm, dry and intact. No rash noted.   ____________________________________________   LABS (all labs ordered are listed, but only abnormal results are displayed)  Labs Reviewed  BASIC METABOLIC PANEL - Abnormal; Notable for the following components:      Result Value   Glucose, Bld 123 (*)    Creatinine, Ser 1.26 (*)    Calcium 8.4 (*)    All other  components within normal limits  CBC - Abnormal; Notable for the following components:   RBC 5.90 (*)    All other components within normal limits  TROPONIN I - Abnormal; Notable for the following components:   Troponin I 0.05 (*)    All other components within normal limits  PROTIME-INR - Abnormal; Notable for the following components:   Prothrombin Time 15.7 (*)    INR 1.3 (*)    All other components within normal limits  TROPONIN I - Abnormal; Notable for the following components:   Troponin I 0.04 (*)    All other components within normal limits  SARS CORONAVIRUS 2 (HOSPITAL ORDER, [place] [facility])   ____________________________________________  EKG EKG read interpreted by me shows normal sinus rhythm at 84.  Left axis.  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  ____________________________________________  RADIOLOGY  ED MD interpretation: Chest x-ray read by radiology reviewed by me shows some scarring but nothing else.  Official radiology report(s): Dg Chest 2 View  Result Date: 07/28/2018 CLINICAL DATA:  Difficulty breathing and chest tightness. EXAM: CHEST - 2 VIEW COMPARISON:  Chest CT 02/03/2018, chest radiograph 02/03/2018 FINDINGS: Cardiomediastinal silhouette is normal. Mediastinal contours appear intact. Persistent elevation of the left hemidiaphragm. There is no evidence of focal airspace consolidation, pleural effusion or pneumothorax. Scattered areas of pulmonary fibrosis/scarring, stable. Osseous structures are without acute abnormality. Soft tissues are grossly normal. IMPRESSION: No active cardiopulmonary disease. Bilateral stable areas of pulmonary fibrosis/scarring. Electronically Signed   By: Fidela Salisbury M.D.   On: 07/28/2018 17:12    ____________________________________________   PROCEDURES  Procedure(s) performed (including Critical Care):  Procedures    ____________________________________________   INITIAL IMPRESSION / ASSESSMENT AND PLAN / ED COURSE  After nebulizing albuterol and ipratropium twice patient still becomes hypoxic.  His troponin is improving.  Chest x-ray was negative he is not running a fever.  He is COVID test is negative.  It looks like he is having a COPD exacerbation but requiring oxygen.  We will get him in the hospital and see if he can improve his oxygenation.Marland Kitcheneed  LYAN MOYANO was evaluated in Emergency Department on 07/28/2018 for the symptoms described in the history of present illness. He was evaluated in the context of the global COVID-19 pandemic, which necessitated consideration that the patient might be at risk for infection with the SARS-CoV-2 virus that causes COVID-19. Institutional protocols and algorithms that pertain to the evaluation of patients at risk for COVID-19 are in a state of rapid change based on information released by regulatory bodies including the CDC and federal and state organizations. These policies and algorithms were followed during the patient's care in the ED.          ____________________________________________   FINAL CLINICAL IMPRESSION(S) / ED DIAGNOSES  Final diagnoses:  Hypoxia  COPD exacerbation [facility])     ED Discharge Orders    None       Note:  This document was prepared using Dragon voice recognition software and may include unintentional dictation errors.    Nena Polio, MD 07/28/18 [phone]

## 2018-07-28 NOTE — ED Notes (Signed)
Assisted pt with getting to the bathroom, then getting repositioned in bed.

## 2018-07-28 NOTE — ED Notes (Signed)
Report given to Paige RN.

## 2018-07-28 NOTE — ED Triage Notes (Addendum)
Patient c/o difficulty breathing and chest tightness for a few days. Cannot take deep breaths. HX COPD. RA sats in triage 87. Placed on 1L O2 with sats 93%. Takes eliquis daily

## 2018-07-28 NOTE — ED Notes (Signed)
This EDT took an extra pillow and a second meal tray to the pt.

## 2018-07-29 DIAGNOSIS — Z20828 Contact with and (suspected) exposure to other viral communicable diseases: Secondary | ICD-10-CM | POA: Diagnosis present

## 2018-07-29 DIAGNOSIS — G4733 Obstructive sleep apnea (adult) (pediatric): Secondary | ICD-10-CM | POA: Diagnosis present

## 2018-07-29 DIAGNOSIS — I480 Paroxysmal atrial fibrillation: Secondary | ICD-10-CM | POA: Diagnosis present

## 2018-07-29 DIAGNOSIS — Z79899 Other long term (current) drug therapy: Secondary | ICD-10-CM | POA: Diagnosis not present

## 2018-07-29 DIAGNOSIS — Z8249 Family history of ischemic heart disease and other diseases of the circulatory system: Secondary | ICD-10-CM | POA: Diagnosis not present

## 2018-07-29 DIAGNOSIS — J9621 Acute and chronic respiratory failure with hypoxia: Secondary | ICD-10-CM | POA: Diagnosis present

## 2018-07-29 DIAGNOSIS — D869 Sarcoidosis, unspecified: Secondary | ICD-10-CM | POA: Diagnosis present

## 2018-07-29 DIAGNOSIS — I5032 Chronic diastolic (congestive) heart failure: Secondary | ICD-10-CM | POA: Diagnosis present

## 2018-07-29 DIAGNOSIS — Z880 Allergy status to penicillin: Secondary | ICD-10-CM | POA: Diagnosis not present

## 2018-07-29 DIAGNOSIS — Z8673 Personal history of transient ischemic attack (TIA), and cerebral infarction without residual deficits: Secondary | ICD-10-CM | POA: Diagnosis not present

## 2018-07-29 DIAGNOSIS — Z7901 Long term (current) use of anticoagulants: Secondary | ICD-10-CM | POA: Diagnosis not present

## 2018-07-29 DIAGNOSIS — E785 Hyperlipidemia, unspecified: Secondary | ICD-10-CM | POA: Diagnosis present

## 2018-07-29 DIAGNOSIS — Z7952 Long term (current) use of systemic steroids: Secondary | ICD-10-CM | POA: Diagnosis not present

## 2018-07-29 DIAGNOSIS — Z833 Family history of diabetes mellitus: Secondary | ICD-10-CM | POA: Diagnosis not present

## 2018-07-29 DIAGNOSIS — I110 Hypertensive heart disease with heart failure: Secondary | ICD-10-CM | POA: Diagnosis present

## 2018-07-29 DIAGNOSIS — Z7982 Long term (current) use of aspirin: Secondary | ICD-10-CM | POA: Diagnosis not present

## 2018-07-29 DIAGNOSIS — E119 Type 2 diabetes mellitus without complications: Secondary | ICD-10-CM | POA: Diagnosis present

## 2018-07-29 DIAGNOSIS — F17210 Nicotine dependence, cigarettes, uncomplicated: Secondary | ICD-10-CM | POA: Diagnosis present

## 2018-07-29 DIAGNOSIS — Z794 Long term (current) use of insulin: Secondary | ICD-10-CM | POA: Diagnosis not present

## 2018-07-29 DIAGNOSIS — J441 Chronic obstructive pulmonary disease with (acute) exacerbation: Secondary | ICD-10-CM | POA: Diagnosis present

## 2018-07-29 DIAGNOSIS — R0602 Shortness of breath: Secondary | ICD-10-CM | POA: Diagnosis present

## 2018-07-29 DIAGNOSIS — I2510 Atherosclerotic heart disease of native coronary artery without angina pectoris: Secondary | ICD-10-CM | POA: Diagnosis present

## 2018-07-29 DIAGNOSIS — Z7951 Long term (current) use of inhaled steroids: Secondary | ICD-10-CM | POA: Diagnosis not present

## 2018-07-29 DIAGNOSIS — E7800 Pure hypercholesterolemia, unspecified: Secondary | ICD-10-CM | POA: Diagnosis present

## 2018-07-29 DIAGNOSIS — Z809 Family history of malignant neoplasm, unspecified: Secondary | ICD-10-CM | POA: Diagnosis not present

## 2018-07-29 LAB — BASIC METABOLIC PANEL
Anion gap: 8 (ref 5–15)
BUN: 18 mg/dL (ref 6–20)
CO2: 34 mmol/L — ABNORMAL HIGH (ref 22–32)
Calcium: 8.4 mg/dL — ABNORMAL LOW (ref 8.9–10.3)
Chloride: 98 mmol/L (ref 98–111)
Creatinine, Ser: 1.49 mg/dL — ABNORMAL HIGH (ref 0.61–1.24)
GFR calc Af Amer: >60 mL/min (ref >60)
GFR calc non Af Amer: 55 mL/min — ABNORMAL LOW (ref >60)
Glucose, Bld: 247 mg/dL — ABNORMAL HIGH (ref 70–99)
Potassium: 4.7 mmol/L (ref 3.5–5.1)
Sodium: 140 mmol/L (ref 135–145)

## 2018-07-29 LAB — GLUCOSE, CAPILLARY
Glucose-Capillary: 242 mg/dL — ABNORMAL HIGH (ref 70–99)
Glucose-Capillary: 246 mg/dL — ABNORMAL HIGH (ref 70–99)
Glucose-Capillary: 152 mg/dL — ABNORMAL HIGH (ref 70–99)
Glucose-Capillary: 197 mg/dL — ABNORMAL HIGH (ref 70–99)
Glucose-Capillary: 276 mg/dL — ABNORMAL HIGH (ref 70–99)

## 2018-07-29 LAB — CBC
HCT: 53.6 % — ABNORMAL HIGH (ref 39.0–52.0)
Hemoglobin: 16.8 g/dL (ref 13.0–17.0)
MCH: 27.8 pg (ref 26.0–34.0)
MCHC: 31.3 g/dL (ref 30.0–36.0)
MCV: 88.6 fL (ref 80.0–100.0)
Platelets: 195 10*3/uL (ref 150–400)
RBC: 6.05 MIL/uL — ABNORMAL HIGH (ref 4.22–5.81)
RDW: 12.9 % (ref 11.5–15.5)
WBC: 5.5 10*3/uL (ref 4.0–10.5)
nRBC: 0 % (ref 0.0–0.2)

## 2018-07-29 LAB — HEMOGLOBIN A1C
Hgb A1c MFr Bld: 6.7 % — ABNORMAL HIGH (ref 4.8–5.6)
Mean Plasma Glucose: 145.59 mg/dL

## 2018-07-29 LAB — TROPONIN I
Troponin I: 0.03 ng/mL (ref <0.03)
Troponin I: 0.03 ng/mL (ref <0.03)
Troponin I: 0.03 ng/mL (ref <0.03)

## 2018-07-29 LAB — BRAIN NATRIURETIC PEPTIDE: B Natriuretic Peptide: 131 pg/mL — ABNORMAL HIGH (ref 0.0–100.0)

## 2018-07-29 MED ORDER — ASPIRIN 81 MG PO CHEW
81.0000 mg | CHEWABLE_TABLET | Freq: Every day | ORAL | Status: DC
  Administered 2018-07-29 – 2018-07-30 (×2): 81 mg via ORAL
  Filled 2018-07-29 (×2): qty 1

## 2018-07-29 MED ORDER — ENSURE MAX PROTEIN PO LIQD
11.0000 [oz_av] | Freq: Two times a day (BID) | ORAL | Status: DC
  Filled 2018-07-29: qty 330

## 2018-07-29 MED ORDER — PREDNISONE 50 MG PO TABS
50.0000 mg | ORAL_TABLET | Freq: Every day | ORAL | Status: DC
  Administered 2018-07-30: 50 mg via ORAL
  Filled 2018-07-29: qty 1

## 2018-07-29 MED ORDER — FLUTICASONE FUROATE-VILANTEROL 200-25 MCG/INH IN AEPB
1.0000 | INHALATION_SPRAY | Freq: Every day | RESPIRATORY_TRACT | Status: DC
  Administered 2018-07-29 – 2018-07-30 (×2): 1 via RESPIRATORY_TRACT
  Filled 2018-07-29: qty 28

## 2018-07-29 MED ORDER — MELATONIN 5 MG PO TABS
5.0000 mg | ORAL_TABLET | Freq: Every evening | ORAL | Status: DC | PRN
  Filled 2018-07-29: qty 1

## 2018-07-29 MED ORDER — METHYLPREDNISOLONE SODIUM SUCC 125 MG IJ SOLR
60.0000 mg | Freq: Four times a day (QID) | INTRAMUSCULAR | Status: DC
  Administered 2018-07-29 (×2): 60 mg via INTRAVENOUS
  Filled 2018-07-29 (×2): qty 2

## 2018-07-29 MED ORDER — METOPROLOL TARTRATE 50 MG PO TABS
100.0000 mg | ORAL_TABLET | Freq: Two times a day (BID) | ORAL | Status: DC
  Administered 2018-07-29 – 2018-07-30 (×4): 100 mg via ORAL
  Filled 2018-07-29 (×4): qty 2

## 2018-07-29 MED ORDER — PANTOPRAZOLE SODIUM 40 MG PO TBEC
40.0000 mg | DELAYED_RELEASE_TABLET | Freq: Every day | ORAL | Status: DC
  Administered 2018-07-30: 40 mg via ORAL
  Filled 2018-07-29 (×2): qty 1

## 2018-07-29 MED ORDER — ATORVASTATIN CALCIUM 20 MG PO TABS
40.0000 mg | ORAL_TABLET | Freq: Every evening | ORAL | Status: DC
  Administered 2018-07-29: 40 mg via ORAL
  Filled 2018-07-29: qty 2

## 2018-07-29 MED ORDER — APIXABAN 5 MG PO TABS
5.0000 mg | ORAL_TABLET | Freq: Two times a day (BID) | ORAL | Status: DC
  Administered 2018-07-29: 5 mg via ORAL
  Filled 2018-07-29: qty 1

## 2018-07-29 MED ORDER — METHYLPREDNISOLONE SODIUM SUCC 125 MG IJ SOLR
60.0000 mg | Freq: Two times a day (BID) | INTRAMUSCULAR | Status: AC
  Administered 2018-07-29: 60 mg via INTRAVENOUS
  Filled 2018-07-29: qty 2

## 2018-07-29 MED ORDER — INSULIN DETEMIR 100 UNIT/ML ~~LOC~~ SOLN
40.0000 [IU] | Freq: Every day | SUBCUTANEOUS | Status: DC
  Administered 2018-07-29 (×2): 40 [IU] via SUBCUTANEOUS
  Filled 2018-07-29 (×3): qty 0.4

## 2018-07-29 MED ORDER — SODIUM CHLORIDE 0.9 % IV SOLN: 250.0000 mL | INTRAVENOUS | Status: DC | PRN

## 2018-07-29 MED ORDER — SODIUM CHLORIDE 0.9% FLUSH
3.0000 mL | Freq: Two times a day (BID) | INTRAVENOUS | Status: DC
  Administered 2018-07-29 – 2018-07-30 (×4): 3 mL via INTRAVENOUS

## 2018-07-29 MED ORDER — PREDNISONE 20 MG PO TABS: 40.0000 mg | ORAL_TABLET | Freq: Every day | ORAL | Status: DC

## 2018-07-29 MED ORDER — AMLODIPINE BESYLATE 5 MG PO TABS
5.0000 mg | ORAL_TABLET | Freq: Every day | ORAL | Status: DC
  Administered 2018-07-29 – 2018-07-30 (×2): 5 mg via ORAL
  Filled 2018-07-29 (×2): qty 1

## 2018-07-29 MED ORDER — POTASSIUM CHLORIDE CRYS ER 20 MEQ PO TBCR
20.0000 meq | EXTENDED_RELEASE_TABLET | Freq: Two times a day (BID) | ORAL | Status: DC
  Administered 2018-07-29 – 2018-07-30 (×3): 20 meq via ORAL
  Filled 2018-07-29 (×3): qty 1

## 2018-07-29 MED ORDER — THEOPHYLLINE ER 300 MG PO TB12
300.0000 mg | ORAL_TABLET | Freq: Two times a day (BID) | ORAL | Status: DC
  Administered 2018-07-29 – 2018-07-30 (×4): 300 mg via ORAL
  Filled 2018-07-29 (×4): qty 1

## 2018-07-29 MED ORDER — ALBUTEROL SULFATE (2.5 MG/3ML) 0.083% IN NEBU
2.5000 mg | INHALATION_SOLUTION | RESPIRATORY_TRACT | Status: DC | PRN
  Administered 2018-07-29 – 2018-07-30 (×3): 2.5 mg via RESPIRATORY_TRACT
  Filled 2018-07-29 (×3): qty 3

## 2018-07-29 MED ORDER — INSULIN ASPART 100 UNIT/ML ~~LOC~~ SOLN
0.0000 [IU] | Freq: Three times a day (TID) | SUBCUTANEOUS | Status: DC
  Administered 2018-07-29 (×2): 2 [IU] via SUBCUTANEOUS
  Administered 2018-07-29 – 2018-07-30 (×2): 3 [IU] via SUBCUTANEOUS
  Administered 2018-07-30: 09:00:00 5 [IU] via SUBCUTANEOUS
  Filled 2018-07-29 (×5): qty 1

## 2018-07-29 MED ORDER — TIOTROPIUM BROMIDE MONOHYDRATE 18 MCG IN CAPS
18.0000 ug | ORAL_CAPSULE | Freq: Every day | RESPIRATORY_TRACT | Status: DC
  Administered 2018-07-29 – 2018-07-30 (×2): 18 ug via RESPIRATORY_TRACT
  Filled 2018-07-29: qty 5

## 2018-07-29 MED ORDER — INSULIN ASPART 100 UNIT/ML ~~LOC~~ SOLN
0.0000 [IU] | Freq: Every day | SUBCUTANEOUS | Status: DC
  Administered 2018-07-29: 3 [IU] via SUBCUTANEOUS
  Filled 2018-07-29: qty 1

## 2018-07-29 MED ORDER — SODIUM CHLORIDE 0.9% FLUSH: 3.0000 mL | INTRAVENOUS | Status: DC | PRN

## 2018-07-29 MED ORDER — APIXABAN 5 MG PO TABS
5.0000 mg | ORAL_TABLET | Freq: Two times a day (BID) | ORAL | Status: DC
  Administered 2018-07-29 – 2018-07-30 (×3): 5 mg via ORAL
  Filled 2018-07-29 (×3): qty 1

## 2018-07-29 NOTE — Consult Note (Signed)
[place] for Medication timing adjustment  Allergies  Allergen Reactions  . Penicillins Anaphylaxis and Other (See Comments)    Has patient had a PCN reaction causing immediate rash, facial/tongue/throat swelling, SOB or lightheadedness with hypotension: Yes Has patient had a PCN reaction causing severe rash involving mucus membranes or skin necrosis: No Has patient had a PCN reaction that required hospitalization: No Has patient had a PCN reaction occurring within the last 10 years: No If all of the above answers are "NO", then may proceed with Cephalosporin use.     Patient Measurements: Height: 6' 0.5" (184.2 cm) Weight: 279 lb 8 oz (126.8 kg) IBW/kg (Calculated) : 78.75  Vital Signs: Temp: 97.6 F (36.4 C) (06/23 1159) Temp Source: Oral (06/23 1159) BP: 134/91 (06/23 1159) Pulse Rate: 79 (06/23 1159) Intake/Output from previous day: No intake/output data recorded. Intake/Output from this shift: Total I/O In: 360 [P.O.:360] Out: 600 [Urine:600]  Labs: Recent Labs    07/28/18 1456 07/29/18 0252  WBC 7.3 5.5  HGB 16.4 16.8  HCT 51.5 53.6*  PLT 196 195  CREATININE 1.26* 1.49*   Estimated Creatinine Clearance: 84 mL/min (A) (by C-G formula based on SCr of 1.49 mg/dL (H)).   Microbiology: Recent Results (from the past 720 hour(s))  SARS Coronavirus 2 (CEPHEID- Performed in [place] hospital lab), Hosp Order     Status: None   Collection Time: 07/28/18  6:46 PM   Specimen: Nasopharyngeal Swab  Result Value Ref Range Status   SARS Coronavirus 2 NEGATIVE NEGATIVE Final    Comment: (NOTE) If result is NEGATIVE SARS-CoV-2 target nucleic acids are NOT DETECTED. The SARS-CoV-2 RNA is generally detectable in upper and lower  respiratory specimens during the acute phase of infection. The lowest  concentration of SARS-CoV-2 viral copies this assay can detect is 250  copies / mL. A negative result does not preclude SARS-CoV-2  infection  and should not be used as the sole basis for treatment or other  patient management decisions.  A negative result may occur with  improper specimen collection / handling, submission of specimen other  than nasopharyngeal swab, presence of viral mutation(s) within the  areas targeted by this assay, and inadequate number of viral copies  (<250 copies / mL). A negative result must be combined with clinical  observations, patient history, and epidemiological information. If result is POSITIVE SARS-CoV-2 target nucleic acids are DETECTED. The SARS-CoV-2 RNA is generally detectable in upper and lower  respiratory specimens dur ing the acute phase of infection.  Positive  results are indicative of active infection with SARS-CoV-2.  Clinical  correlation with patient history and other diagnostic information is  necessary to determine patient infection status.  Positive results do  not rule out bacterial infection or co-infection with other viruses. If result is PRESUMPTIVE POSTIVE SARS-CoV-2 nucleic acids MAY BE PRESENT.   A presumptive positive result was obtained on the submitted specimen  and confirmed on repeat testing.  While 2019 novel coronavirus  (SARS-CoV-2) nucleic acids may be present in the submitted sample  additional confirmatory testing may be necessary for epidemiological  and / or clinical management purposes  to differentiate between  SARS-CoV-2 and other Sarbecovirus currently known to infect humans.  If clinically indicated additional testing with an alternate test  methodology [phone]) is advised. The SARS-CoV-2 RNA is generally  detectable in upper and lower respiratory sp ecimens during the acute  phase of infection. The expected result is Negative. Fact Sheet  for Patients:  StrictlyIdeas.no Fact Sheet for Healthcare Providers: BankingDealers.co.za This test is not yet approved or cleared by the [place]  FDA and has been authorized for detection and/or diagnosis of SARS-CoV-2 by FDA under an Emergency Use Authorization (EUA).  This EUA will remain in effect (meaning this test can be used) for the duration of the COVID-19 declaration under Section 564(b)(1) of the Act, 21 U.S.C. section 360bbb-3(b)(1), unless the authorization is terminated or revoked sooner. Performed at [facility], [address] [place], [place] [address]     Medications/Plan:  Adjusted times per pt request on 2 medications - Eliquis and Theophylline - timing less critical on other medications - will continue to monitor.  Lu Duffel, PharmD, BCPS Clinical Pharmacist 07/29/2018 2:26 PM

## 2018-07-29 NOTE — ED Notes (Signed)
Respiratory at bedside to place pt on cpap

## 2018-07-29 NOTE — Evaluation (Signed)
Occupational Therapy Evaluation Patient Details Name: [name] MRN: [id number] DOB: 12/26/1970 Today's Date: 07/29/2018    History of Present Illness Pt. is a 48 y.o. male with a known history of COPD, hyperlipidemia, CHF, obstructive sleep apnea, diabetes mellitus.  He presented to the emergency room complaining of 6-day history of increased shortness of breath and chest tightness present with exertion.   Clinical Impression   Pt. presents with weakness, and limited activity tolerance. Pt. resides at home with his wife. Pt. was independent with ADLs, and IADL functioning: including meal preparation, and medication management. Pt. was able to drive. Pt. is on home BiPAP machine. Pt. is currently on 2LO2 with SO2 at 92%. Pt. education was provided about energy conservation, work simplification techniques, and pursed lip breathing, and A/E use for LE ADLs. Pt. was provided with a home exercise program through [facility] for BUE strengthening. Red, and green theraband was provided to the pt. Pt. was able to demonstrate the exercises independently. Pt. could benefit from OT services for review of the UE HEP, and Pt. education about Energy conservation, and work simplification techniques, pursed lip breathing, home modification, and DME. No follow up OT services are warranted at this time.   Follow Up Recommendations  No OT follow up    Equipment Recommendations       Recommendations for Other Services       Precautions / Restrictions Precautions Precautions: Fall Restrictions Weight Bearing Restrictions: No      Mobility Bed Mobility Overal bed mobility: Independent                Transfers Overall transfer level: Independent                    Balance                                           ADL either performed or assessed with clinical judgement   ADL Overall ADL's : Independent                                        General ADL Comments: Pt. education was provided about A/E use for LE ADLs, and IADLs.     Vision Baseline Vision/History: Wears glasses Wears Glasses: Reading only Patient Visual Report: No change from baseline       Perception     Praxis      Pertinent Vitals/Pain Pain Assessment: No/denies pain     Hand Dominance Right   Extremity/Trunk Assessment Upper Extremity Assessment Upper Extremity Assessment: Overall WFL for tasks assessed           Communication Communication Communication: No difficulties   Cognition Arousal/Alertness: Awake/alert Behavior During Therapy: WFL for tasks assessed/performed Overall Cognitive Status: Within Functional Limits for tasks assessed                                     General Comments       Exercises   BUE there,. Ex. with red/green theraband for bilateral shoulder flexion, horizontal abduction, elbow flexion, and extension.   Shoulder Instructions      Home Living Family/patient expects to be discharged to:: Private residence Living Arrangements: Spouse/significant other  Available Help at Discharge: Family Type of Home: House Home Access: Stairs to enter Technical brewer of Steps: 3 Entrance Stairs-Rails: None Home Layout: One level     Bathroom Shower/Tub: Tub/shower unit         Home Equipment: None          Prior Functioning/Environment Level of Independence: Independent                 OT Problem List: Decreased activity tolerance;Decreased strength      OT Treatment/Interventions: Self-care/ADL training;Therapeutic exercise;Neuromuscular education;DME and/or AE instruction;Patient/family education;Therapeutic activities    OT Goals(Current goals can be found in the care plan section) Acute Rehab OT Goals Patient Stated Goal: To go home OT Goal Formulation: With patient Potential to Achieve Goals: Good  OT Frequency: Min 2X/week   Barriers to D/C:             Co-evaluation              AM-PAC OT "6 Clicks" Daily Activity     Outcome Measure Help from another person eating meals?: None Help from another person taking care of personal grooming?: None Help from another person toileting, which includes using toliet, bedpan, or urinal?: None Help from another person bathing (including washing, rinsing, drying)?: None Help from another person to put on and taking off regular upper body clothing?: None Help from another person to put on and taking off regular lower body clothing?: None 6 Click Score: 24   End of Session Equipment Utilized During Treatment: Gait belt  Activity Tolerance: Patient tolerated treatment well Patient left: in bed;with call bell/phone within reach;with bed alarm set  OT Visit Diagnosis: Muscle weakness (generalized) (M62.81)                Time: 1550-1613 23 min.            1430-1445 15 min.   Charges:  OT Evaluation $OT Eval Moderate Complexity: 1 Mod OT Treatments $Self Care/Home Management : 8-22 mins  Harrel Carina, MS, OTR/L  Harrel Carina 07/29/2018, 5:01 PM

## 2018-07-29 NOTE — TOC Initial Note (Signed)
Transition of Care [facility]) - Initial/Assessment Note    Patient Details  Name: [name] MRN: [id number] Date of Birth: [birth year]  Transition of Care [facility]) CM/SW Contact:    Latanya Maudlin, RN Phone Number: 07/29/2018, 10:27 AM  Clinical Narrative:  [facility] team consulted to complete high risk readmission assessment. Patient lives at home with his wife. Patient is typically active telling me he goes to the gym three times a week when COVID was not restricting places. Patient uses no DME expect for BiPAP at night. Patient is on chronic eliquis and has no issues obtaining medications. Patient worked with PT and reports hi strength is adequate he just becomes short of breath. Patient does not believe any follow up is necessary. Follow for PCP services at [facility].                   Expected Discharge Plan: Home/Self Care Barriers to Discharge: Continued Medical Work up   Patient Goals and CMS Choice Patient states their goals for this hospitalization and ongoing recovery are:: to breathe better after the steroids CMS Medicare.gov Compare Post Acute Care list provided to:: Patient    Expected Discharge Plan and Services Expected Discharge Plan: Home/Self Care   Discharge Planning Services: CM Consult   Living arrangements for the past 2 months: Single Family Home                                      Prior Living Arrangements/Services Living arrangements for the past 2 months: Single Family Home Lives with:: Spouse Patient language and need for interpreter reviewed:: Yes Do you feel safe going back to the place where you live?: Yes      Need for Family Participation in Patient Care: No (Comment) Care giver support system in place?: No (comment)   Criminal Activity/Legal Involvement Pertinent to Current Situation/Hospitalization: No - Comment as needed  Activities of Daily Living Home Assistive Devices/Equipment: None ADL Screening (condition at time of  admission) Patient's cognitive ability adequate to safely complete daily activities?: Yes Is the patient deaf or have difficulty hearing?: No Does the patient have difficulty seeing, even when wearing glasses/contacts?: No Does the patient have difficulty concentrating, remembering, or making decisions?: No Patient able to express need for assistance with ADLs?: Yes Does the patient have difficulty dressing or bathing?: No Independently performs ADLs?: Yes (appropriate for developmental age) Does the patient have difficulty walking or climbing stairs?: No Weakness of Legs: None Weakness of Arms/Hands: None  Permission Sought/Granted Permission sought to share information with : Case Manager                Emotional Assessment Appearance:: Appears stated age Attitude/Demeanor/Rapport: Engaged Affect (typically observed): Accepting Orientation: : Oriented to Self, Oriented to Place, Oriented to  Time, Oriented to Situation      Admission diagnosis:  Hypoxia [R09.02] COPD exacerbation ([place]) [J44.1] Patient Active Problem List   Diagnosis Date Noted  . Acute exacerbation of chronic obstructive pulmonary disease (COPD) ([place]) 07/29/2018  . COPD with acute exacerbation ([place]) 02/03/2018  . COPD (chronic obstructive pulmonary disease) ([place]) 07/16/2016  . Chronic diastolic heart failure ([place]) 03/23/2016  . Hypercarbia   . COPD exacerbation ([place]) 11/17/2015  . HTN (hypertension) 06/14/2015  . Dental caries 03/08/2015  . Morbid obesity ([place]) 02/21/2015  . OSA and COPD overlap syndrome ([place]) 02/21/2015  . Thrombocytopenia ([place]) 02/21/2015  .  Hypernatremia 02/21/2015  . Diabetes ([place]) 10/19/2014  . Chronic combined systolic and diastolic congestive heart failure ([place]) 10/18/2014  . Chronic obstructive pulmonary disease ([place]) 10/18/2014  . Sarcoidosis 04/22/2013  . Other and unspecified hyperlipidemia 04/22/2013   PCP:  [place]:   [facility]  [address] [place]) [place] [address] Phone: [phone] Fax: [phone]     Social Determinants of Health (SDOH) Interventions    Readmission Risk Interventions Readmission Risk Prevention Plan 07/29/2018  Transportation Screening Complete  PCP or Specialist Appt within 3-5 Days Complete  HRI or [place] Complete  Social Work Consult for [place] Planning/Counseling Patient refused  Palliative Care Screening Not Applicable  Medication Review Press photographer) Complete  Some recent data might be hidden

## 2018-07-29 NOTE — Plan of Care (Signed)
Patient doing well.  Lungs are diminished but patient states his breathing feels better.  Ambulating in the room with O2.  No significant changes.  I spoke to the patient's wife and gave her an update.

## 2018-07-29 NOTE — Progress Notes (Addendum)
Nutrition Brief Note  RD received consult for assessment   48 y.o. male with a known history of COPD, hyperlipidemia, CHF, obstructive sleep apnea and diabetes mellitus admitted with COPD exacerbation  Pt currently eating 100% of meals. RD will add Ensure Max protein to help pt meet his estimated protein needs. Per chart, pt appears fairly weight stable pta.    Wt Readings from Last 15 Encounters:  07/28/18 126.8 kg  02/04/18 124.7 kg  10/24/17 120.2 kg  07/18/16 131.3 kg  07/15/16 129.6 kg  07/01/16 127 kg  03/26/16 131.1 kg  03/22/16 131.6 kg  12/20/15 134.3 kg  11/18/15 125.8 kg  09/15/15 135.6 kg  06/14/15 132 kg  03/15/15 134.3 kg  03/08/15 136.1 kg  02/21/15 130.1 kg    Body mass index is 37.39 kg/m. Patient meets criteria for obesity based on current BMI.   Current diet order is HH, patient is consuming approximately 100% of meals at this time. Labs and medications reviewed.   RD will add Ensure Max protein supplement BID, each supplement provides 150kcal and 30g of protein.  No nutrition interventions warranted at this time. If nutrition issues arise, please consult RD.   Koleen Distance MS, RD, LDN Pager #- [phone] Office#- [phone] After Hours Pager: [phone]

## 2018-07-29 NOTE — Evaluation (Signed)
Physical Therapy Evaluation Patient Details Name: [name] MRN: [id number] DOB: [birth year] Today's Date: 07/29/2018   History of Present Illness  48 y.o. male with a known history of COPD, hyperlipidemia, CHF, obstructive sleep apnea, diabetes mellitus.  He presented to the emergency room complaining of 6-day history of increased shortness of breath and chest tightness present with exertion.  Clinical Impression  Pt did well with PT exam and reports apart from being somewhat fatigued he did well.  Pt with O2 in the high 80s t/o the effort with rarely reaching 90s, 3L during ambulation and vitals did not drop below 87% despite some fatigue.  Pt reports feeling well enough to go home without further PT follow up.  Will complete orders, apart from increased O2 requirements he is safe to go home.    Follow Up Recommendations No PT follow up    Equipment Recommendations  None recommended by PT    Recommendations for Other Services       Precautions / Restrictions Precautions Precautions: Fall Restrictions Weight Bearing Restrictions: No      Mobility  Bed Mobility Overal bed mobility: Independent             General bed mobility comments: easily able to rise to EOB w/o assist  Transfers Overall transfer level: Independent Equipment used: None             General transfer comment: Pt was able to rise to standing w/o assist  Ambulation/Gait Ambulation/Gait assistance: Modified independent (Device/Increase time) Gait Distance (Feet): 125 Feet Assistive device: None       General Gait Details: Pt was able to ambulate with consistent and safe cadence. He showed good effort and on 3L O2 his sats remained in the high 80s and did not drop with the effort.   Stairs Stairs: Yes Stairs assistance: Supervision Stair Management: No rails Number of Stairs: 3 General stair comments: Pt was able to descend and ascent steps simulating exiting/entering home w/o  assist  Wheelchair Mobility    Modified Rankin (Stroke Patients Only)       Balance Overall balance assessment: Modified Independent                                           Pertinent Vitals/Pain Pain Assessment: No/denies pain    Home Living Family/patient expects to be discharged to:: Private residence Living Arrangements: Spouse/significant other Available Help at Discharge: (wife out of the home during the day)   Home Access: Stairs to enter Entrance Stairs-Rails: None Technical brewer of Steps: 3   Home Equipment: None(O2)      Prior Function Level of Independence: Independent         Comments: Pt reports that he does need O2 sometimes based on how he is feeling but otherwise can do what he likes w/o issue     Hand Dominance        Extremity/Trunk Assessment   Upper Extremity Assessment Upper Extremity Assessment: Overall WFL for tasks assessed    Lower Extremity Assessment Lower Extremity Assessment: Overall WFL for tasks assessed       Communication   Communication: No difficulties  Cognition Arousal/Alertness: Awake/alert Behavior During Therapy: WFL for tasks assessed/performed Overall Cognitive Status: Within Functional Limits for tasks assessed  General Comments      Exercises     Assessment/Plan    PT Assessment Patent does not need any further PT services  PT Problem List         PT Treatment Interventions      PT Goals (Current goals can be found in the Care Plan section)  Acute Rehab PT Goals Patient Stated Goal: go home  PT Goal Formulation: All assessment and education complete, DC therapy    Frequency     Barriers to discharge        Co-evaluation               AM-PAC PT "6 Clicks" Mobility  Outcome Measure Help needed turning from your back to your side while in a flat bed without using bedrails?: None Help needed moving  from lying on your back to sitting on the side of a flat bed without using bedrails?: None Help needed moving to and from a bed to a chair (including a wheelchair)?: None Help needed standing up from a chair using your arms (e.g., wheelchair or bedside chair)?: None Help needed to walk in hospital room?: None Help needed climbing 3-5 steps with a railing? : None 6 Click Score: 24    End of Session Equipment Utilized During Treatment: Gait belt;Oxygen(2.5 on wall, 3L during ambulation) Activity Tolerance: Patient limited by fatigue;Patient tolerated treatment well Patient left: with chair alarm set;with call bell/phone within reach   PT Visit Diagnosis: Difficulty in walking, not elsewhere classified (R26.2)    Time: 7564-3329 PT Time Calculation (min) (ACUTE ONLY): 16 min   Charges:   PT Evaluation $PT Eval Low Complexity: 1 Low          Kreg Shropshire, DPT 07/29/2018, 1:02 PM

## 2018-07-29 NOTE — Progress Notes (Signed)
[place] [facility] [place] NAME: [name]    MR#:  [id number]  DATE OF BIRTH:  [birth year]  SUBJECTIVE:   Patient came in with increasing shortness of breath and wheezing a lot better today. Uses oxygen as needed at home. Continues to smoke. REVIEW OF SYSTEMS:   Review of Systems  Constitutional: Negative for chills, fever and weight loss.  HENT: Negative for ear discharge, ear pain and nosebleeds.   Eyes: Negative for blurred vision, pain and discharge.  Respiratory: Positive for shortness of breath and wheezing. Negative for sputum production and stridor.   Cardiovascular: Negative for chest pain, palpitations, orthopnea and PND.  Gastrointestinal: Negative for abdominal pain, diarrhea, nausea and vomiting.  Genitourinary: Negative for frequency and urgency.  Musculoskeletal: Negative for back pain and joint pain.  Neurological: Negative for sensory change, speech change, focal weakness and weakness.  Psychiatric/Behavioral: Negative for depression and hallucinations. The patient is not nervous/anxious.    Tolerating Diet:yes Tolerating PT: no PT needs  DRUG ALLERGIES:   Allergies  Allergen Reactions  . Penicillins Anaphylaxis and Other (See Comments)    Has patient had a PCN reaction causing immediate rash, facial/tongue/throat swelling, SOB or lightheadedness with hypotension: Yes Has patient had a PCN reaction causing severe rash involving mucus membranes or skin necrosis: No Has patient had a PCN reaction that required hospitalization: No Has patient had a PCN reaction occurring within the last 10 years: No If all of the above answers are "NO", then may proceed with Cephalosporin use.     VITALS:  Blood pressure (!) 134/91, pulse 79, temperature 97.6 F (36.4 C), temperature source Oral, resp. rate 16, height 6' 0.5" (1.842 m), weight 126.8 kg, SpO2 94 %.  PHYSICAL EXAMINATION:   Physical Exam  GENERAL:  48  y.o.-year-old patient lying in the bed with no acute distress. obese EYES: Pupils equal, round, reactive to light and accommodation. No scleral icterus. Extraocular muscles intact.  HEENT: Head atraumatic, normocephalic. Oropharynx and nasopharynx clear.  NECK:  Supple, no jugular venous distention. No thyroid enlargement, no tenderness.  LUNGS: distant breath sounds bilaterally, no wheezing, rales, rhonchi. No use of accessory muscles of respiration.  CARDIOVASCULAR: S1, S2 normal. No murmurs, rubs, or gallops.  ABDOMEN: Soft, nontender, nondistended. Bowel sounds present. No organomegaly or mass.  EXTREMITIES: No cyanosis, clubbing or edema b/l.    NEUROLOGIC: Cranial nerves II through XII are intact. No focal Motor or sensory deficits b/l.   PSYCHIATRIC:  patient is alert and oriented x 3.  SKIN: No obvious rash, lesion, or ulcer.   LABORATORY PANEL:  CBC Recent Labs  Lab 07/29/18 0252  WBC 5.5  HGB 16.8  HCT 53.6*  PLT 195    Chemistries  Recent Labs  Lab 07/29/18 0252  NA 140  K 4.7  CL 98  CO2 34*  GLUCOSE 247*  BUN 18  CREATININE 1.49*  CALCIUM 8.4*   Cardiac Enzymes Recent Labs  Lab 07/29/18 1221  TROPONINI 0.03*   RADIOLOGY:  Dg Chest 2 View  Result Date: 07/28/2018 CLINICAL DATA:  Difficulty breathing and chest tightness. EXAM: CHEST - 2 VIEW COMPARISON:  Chest CT 02/03/2018, chest radiograph 02/03/2018 FINDINGS: Cardiomediastinal silhouette is normal. Mediastinal contours appear intact. Persistent elevation of the left hemidiaphragm. There is no evidence of focal airspace consolidation, pleural effusion or pneumothorax. Scattered areas of pulmonary fibrosis/scarring, stable. Osseous structures are without acute abnormality. Soft tissues are grossly normal. IMPRESSION: No active cardiopulmonary disease. Bilateral  stable areas of pulmonary fibrosis/scarring. Electronically Signed   By: Fidela Salisbury M.D.   On: 07/28/2018 17:12   ASSESSMENT AND PLAN:    [name]  is a 48 y.o. male with a known history of COPD, hyperlipidemia, CHF, obstructive sleep apnea, diabetes mellitus.  He presented to the emergency room complaining of 6-day history of increased shortness of breath and chest tightness present with exertion such as 20 feet ambulation on a flat surface.   1. acute on chornic exacerbation COPD - Spiriva and Brio Ellipta continued - IV Solu-Medrol every 12 hourly. Change to po prednisone - DuoNebs every 6 hours as needed for shortness of breath or cough - O2 per nasal cannula as needed  2. chronic diastolic CHF--ejection fraction 60% on echocardiogram 2017 - With troponin elevated 0.04 - BNP is 131 - No evidence of exacerbation on chest x-ray - We will continue every 6 hours troponins x3 --Patient is on Eliquis for stroke prevention with a history of paroxysmal atrial fibrillation  3.  Obstructive sleep apnea  - Patient will have CPAP during hours of sleep  4.  Diabetes mellitus --Hemoglobin A1c 6.7 - Sensitive sliding scale insulin -cont po home meds and insulin  DVT and PPI prophylaxis initiated  If patient continues to show improvement will discharged him home tomorrow. Patient is in agreement.  Case discussed with Care Management/Social Worker.  CODE STATUS: Full  DVT Prophylaxis: eliquis  TOTAL TIME TAKING CARE OF THIS PATIENT: 30* minutes.  >50% time spent on counselling and coordination of care  POSSIBLE D/C IN *1* DAYS, DEPENDING ON CLINICAL CONDITION.  Note: This dictation was prepared with Dragon dictation along with smaller phrase technology. Any transcriptional errors that result from this process are unintentional.  Fritzi Mandes M.D on 07/29/2018 at 2:43 PM  Between 7am to 6pm - Pager - [phone]  After 6pm go to [email or web address] - Proofreader  [facility]  Office  [phone]  CC: Primary care physician; [facility], IncPatient ID: [name], male    DOB: 11-25-1970, 48 y.o.   MRN: [id number]

## 2018-07-29 NOTE — ED Notes (Signed)
ED TO INPATIENT HANDOFF REPORT  ED Nurse Name and Phone #: Lorriane Shire [phone]  S Name/Age/Gender [name] 48 y.o. male Room/Bed: ED13A/ED13A  Code Status   Code Status: Full Code  Home/SNF/Other Home Patient oriented to: self, place, time and situation Is this baseline? Yes   Triage Complete: Triage complete  Chief Complaint high bp/sob/chest tightness  Triage Note Patient c/o difficulty breathing and chest tightness for a few days. Cannot take deep breaths. HX COPD. RA sats in triage 87. Placed on 1L O2 with sats 93%. Takes eliquis daily   Allergies Allergies  Allergen Reactions  . Penicillins Anaphylaxis and Other (See Comments)    Has patient had a PCN reaction causing immediate rash, facial/tongue/throat swelling, SOB or lightheadedness with hypotension: Yes Has patient had a PCN reaction causing severe rash involving mucus membranes or skin necrosis: No Has patient had a PCN reaction that required hospitalization: No Has patient had a PCN reaction occurring within the last 10 years: No If all of the above answers are "NO", then may proceed with Cephalosporin use.     Level of Care/Admitting Diagnosis ED Disposition    ED Disposition Condition Comment   Admit  Hospital Area: [facility] [[id number]]  Level of Care: Med-Surg [16]  Covid Evaluation: Confirmed COVID Negative  Diagnosis: Acute exacerbation of chronic obstructive pulmonary disease (COPD) [facility]) [[id number]]  Admitting Physician: Mayer Camel [[id number]]  Attending Physician: Mayer Camel [[id number]]  Estimated length of stay: past midnight tomorrow  Certification:: I certify this patient will need inpatient services for at least 2 midnights  PT Class (Do Not Modify): Inpatient [101]  PT Acc Code (Do Not Modify): Private [1]       B Medical/Surgery History Past Medical History:  Diagnosis Date  . CHF (congestive heart failure) ([place])   . COPD (chronic obstructive pulmonary  disease) ([place])   . Coronary artery disease   . Diabetes mellitus without complication ([place])   . Hypercholesteremia unk  . Hypertension   . Sarcoidosis   . Sleep apnea    does not use CPAP regularly.  . Stroke [facility])    Past Surgical History:  Procedure Laterality Date  . APPENDECTOMY    . PARTIAL NEPHRECTOMY       A IV Location/Drains/Wounds Patient Lines/Drains/Airways Status   Active Line/Drains/Airways    Name:   Placement date:   Placement time:   Site:   Days:   Peripheral IV 07/28/18 Left Wrist   07/28/18    1811    Wrist   1          Intake/Output Last 24 hours No intake or output data in the 24 hours ending 07/29/18 0205  Labs/Imaging Results for orders placed or performed during the hospital encounter of 07/28/18 (from the past 48 hour(s))  Basic metabolic panel     Status: Abnormal   Collection Time: 07/28/18  2:56 PM  Result Value Ref Range   Sodium 141 135 - 145 mmol/L   Potassium 4.2 3.5 - 5.1 mmol/L   Chloride 103 98 - 111 mmol/L   CO2 30 22 - 32 mmol/L   Glucose, Bld 123 (H) 70 - 99 mg/dL   BUN 15 6 - 20 mg/dL   Creatinine, Ser 1.26 (H) 0.61 - 1.24 mg/dL   Calcium 8.4 (L) 8.9 - 10.3 mg/dL   GFR calc non Af Amer >60 >60 mL/min   GFR calc Af Amer >60 >60 mL/min   Anion gap 8 5 -  15    Comment: Performed at [facility], [address] [place], [place] [address]  CBC     Status: Abnormal   Collection Time: 07/28/18  2:56 PM  Result Value Ref Range   WBC 7.3 4.0 - 10.5 K/uL   RBC 5.90 (H) 4.22 - 5.81 MIL/uL   Hemoglobin 16.4 13.0 - 17.0 g/dL   HCT 51.5 39.0 - 52.0 %   MCV 87.3 80.0 - 100.0 fL   MCH 27.8 26.0 - 34.0 pg   MCHC 31.8 30.0 - 36.0 g/dL   RDW 12.8 11.5 - 15.5 %   Platelets 196 150 - 400 K/uL   nRBC 0.0 0.0 - 0.2 %    Comment: Performed at [facility], [address] [place], [place] [address]  Troponin I - ONCE - STAT     Status: Abnormal   Collection Time: 07/28/18  2:56 PM  Result Value Ref Range   Troponin I  0.05 (HH) <0.03 ng/mL    Comment: CRITICAL RESULT CALLED TO, READ BACK BY AND VERIFIED WITH AMY COHEN @1539  07/28/18 MJU Performed at [facility], [address] [place], [place] [address]   Protime-INR (order if Patient is taking Coumadin / Warfarin)     Status: Abnormal   Collection Time: 07/28/18  2:56 PM  Result Value Ref Range   Prothrombin Time 15.7 (H) 11.4 - 15.2 seconds   INR 1.3 (H) 0.8 - 1.2    Comment: (NOTE) INR goal varies based on device and disease states. Performed at [facility], [address] [place], [place] [address]   Troponin I - Once     Status: Abnormal   Collection Time: 07/28/18  6:11 PM  Result Value Ref Range   Troponin I 0.04 (HH) <0.03 ng/mL    Comment: CRITICAL VALUE NOTED. VALUE IS CONSISTENT WITH PREVIOUSLY REPORTED/CALLED VALUE/TFK Performed at [facility], [address] [place], [place] [address]   SARS Coronavirus 2 (CEPHEID- Performed in [facility] hospital lab), Hosp Order     Status: None   Collection Time: 07/28/18  6:46 PM   Specimen: Nasopharyngeal Swab  Result Value Ref Range   SARS Coronavirus 2 NEGATIVE NEGATIVE    Comment: (NOTE) If result is NEGATIVE SARS-CoV-2 target nucleic acids are NOT DETECTED. The SARS-CoV-2 RNA is generally detectable in upper and lower  respiratory specimens during the acute phase of infection. The lowest  concentration of SARS-CoV-2 viral copies this assay can detect is 250  copies / mL. A negative result does not preclude SARS-CoV-2 infection  and should not be used as the sole basis for treatment or other  patient management decisions.  A negative result may occur with  improper specimen collection / handling, submission of specimen other  than nasopharyngeal swab, presence of viral mutation(s) within the  areas targeted by this assay, and inadequate number of viral copies  (<250 copies / mL). A negative result must be combined with clinical  observations, patient  history, and epidemiological information. If result is POSITIVE SARS-CoV-2 target nucleic acids are DETECTED. The SARS-CoV-2 RNA is generally detectable in upper and lower  respiratory specimens dur ing the acute phase of infection.  Positive  results are indicative of active infection with SARS-CoV-2.  Clinical  correlation with patient history and other diagnostic information is  necessary to determine patient infection status.  Positive results do  not rule out bacterial infection or co-infection with other viruses. If result is PRESUMPTIVE POSTIVE SARS-CoV-2 nucleic  acids MAY BE PRESENT.   A presumptive positive result was obtained on the submitted specimen  and confirmed on repeat testing.  While 2019 novel coronavirus  (SARS-CoV-2) nucleic acids may be present in the submitted sample  additional confirmatory testing may be necessary for epidemiological  and / or clinical management purposes  to differentiate between  SARS-CoV-2 and other Sarbecovirus currently known to infect humans.  If clinically indicated additional testing with an alternate test  methodology [id number]) is advised. The SARS-CoV-2 RNA is generally  detectable in upper and lower respiratory sp ecimens during the acute  phase of infection. The expected result is Negative. Fact Sheet for Patients:  StrictlyIdeas.no Fact Sheet for Healthcare Providers: BankingDealers.co.za This test is not yet approved or cleared by the [place] FDA and has been authorized for detection and/or diagnosis of SARS-CoV-2 by FDA under an Emergency Use Authorization (EUA).  This EUA will remain in effect (meaning this test can be used) for the duration of the COVID-19 declaration under Section 564(b)(1) of the Act, 21 U.S.C. section 360bbb-3(b)(1), unless the authorization is terminated or revoked sooner. Performed at [facility], [address] [place], [place]  [address]    Dg Chest 2 View  Result Date: 07/28/2018 CLINICAL DATA:  Difficulty breathing and chest tightness. EXAM: CHEST - 2 VIEW COMPARISON:  Chest CT 02/03/2018, chest radiograph 02/03/2018 FINDINGS: Cardiomediastinal silhouette is normal. Mediastinal contours appear intact. Persistent elevation of the left hemidiaphragm. There is no evidence of focal airspace consolidation, pleural effusion or pneumothorax. Scattered areas of pulmonary fibrosis/scarring, stable. Osseous structures are without acute abnormality. Soft tissues are grossly normal. IMPRESSION: No active cardiopulmonary disease. Bilateral stable areas of pulmonary fibrosis/scarring. Electronically Signed   By: Fidela Salisbury M.D.   On: 07/28/2018 17:12    Pending Labs Unresulted Labs (From admission, onward)    Start     Ordered   07/29/18 9417  Basic metabolic panel  Tomorrow morning,   STAT     07/29/18 0034   07/29/18 0500  CBC  Tomorrow morning,   STAT     07/29/18 0034   07/29/18 0036  Hemoglobin A1c  Once,   STAT    Comments: To assess prior glycemic control    07/29/18 0036   07/29/18 0035  Brain natriuretic peptide  Once,   STAT     07/29/18 0034   07/29/18 0035  HIV antibody  Once,   STAT     07/29/18 0034   07/29/18 0034  Troponin I - Now Then Q6H  Now then every 6 hours,   STAT     07/29/18 0034          Vitals/Pain Today's Vitals   07/29/18 0100 07/29/18 0108 07/29/18 0130 07/29/18 0200  BP: 132/81  (!) 149/95 (!) 152/92  Pulse: 84  89 93  Resp:  (!) 22    Temp:      TempSrc:      SpO2: 92%  90% 94%  Weight:      Height:      PainSc:        Isolation Precautions Droplet and Contact precautions  Medications Medications  potassium chloride SA (K-DUR) CR tablet 20 mEq (has no administration in time range)  amLODipine (NORVASC) tablet 5 mg (has no administration in time range)  apixaban (ELIQUIS) tablet 5 mg (has no administration in time range)  aspirin chewable tablet 81 mg (has no  administration in time range)  atorvastatin (LIPITOR) tablet 40 mg (has  no administration in time range)  insulin detemir (LEVEMIR) injection 40-50 Units (has no administration in time range)  fluticasone furoate-vilanterol (BREO ELLIPTA) 200-25 MCG/INH 1 puff (has no administration in time range)  Melatonin TABS 5 mg (has no administration in time range)  metoprolol tartrate (LOPRESSOR) tablet 100 mg (has no administration in time range)  theophylline (THEODUR) 12 hr tablet 300 mg (has no administration in time range)  tiotropium (SPIRIVA) inhalation capsule ([facility] use ONLY) 18 mcg (has no administration in time range)  sodium chloride flush (NS) 0.9 % injection 3 mL (has no administration in time range)  sodium chloride flush (NS) 0.9 % injection 3 mL (has no administration in time range)  0.9 %  sodium chloride infusion (has no administration in time range)  methylPREDNISolone sodium succinate (SOLU-MEDROL) 125 mg/2 mL injection 60 mg (has no administration in time range)    Followed by  predniSONE (DELTASONE) tablet 40 mg (has no administration in time range)  albuterol (PROVENTIL) (2.5 MG/3ML) 0.083% nebulizer solution 2.5 mg (has no administration in time range)  insulin aspart (novoLOG) injection 0-9 Units (has no administration in time range)  insulin aspart (novoLOG) injection 0-5 Units (has no administration in time range)  pantoprazole (PROTONIX) EC tablet 40 mg (has no administration in time range)  ipratropium-albuterol (DUONEB) 0.5-2.5 (3) MG/3ML nebulizer solution 3 mL (3 mLs Nebulization Given 07/28/18 1818)  ipratropium-albuterol (DUONEB) 0.5-2.5 (3) MG/3ML nebulizer solution 3 mL (3 mLs Nebulization Given 07/28/18 1818)  methylPREDNISolone sodium succinate (SOLU-MEDROL) 125 mg/2 mL injection 125 mg (125 mg Intravenous Given 07/28/18 1814)    Mobility walks Low fall risk   Focused Assessments Pulmonary Assessment Handoff:  Lung sounds:   O2 Device: Room Air O2 Flow Rate  (L/min): 2 L/min      R Recommendations: See Admitting Provider Note  Report given to:   Additional Notes: cpap at night

## 2018-07-29 NOTE — H&P (Signed)
[facility] [place] NAME: [name]    MR#:  [id number]  DATE OF BIRTH:  April 25, 1970  DATE OF ADMISSION:  07/28/2018  PRIMARY CARE PHYSICIAN: [place]   REQUESTING/REFERRING PHYSICIAN: Conni Slipper, MD  CHIEF COMPLAINT:   Chief Complaint  Patient presents with   Shortness of Breath    HISTORY OF PRESENT ILLNESS:  [name]  is a 48 y.o. male with a known history of COPD, hyperlipidemia, CHF, obstructive sleep apnea, diabetes mellitus.  He presented to the emergency room complaining of 6-day history of increased shortness of breath and chest tightness present with exertion such as 20 feet ambulation on a flat surface.  He denies productive cough.  He is more short of breath when lying flat.  Patient uses BiPAP at night and usually sleeps with his upper body elevated.  He has not noted increased edema of his lower extremities.  He denies fevers or chills.  He denies nausea, vomiting, diarrhea.  He denies abdominal pain.  Chest tightness is present on exertion usually associated with dyspnea and nonradiating.  Patient continues to smoke less than 1/2 pack cigarettes per day.  COPD is treated with Spiriva, Brio Ellipta, and patient is also treated with theophylline.  Chest x-ray on arrival demonstrates no acute pulmonary disease.  Troponin is 0.04.  BNP is pending.  He has been admitted to the hospital service for acute exacerbation of COPD.  PAST MEDICAL HISTORY:   Past Medical History:  Diagnosis Date   CHF (congestive heart failure) ([facility])    COPD (chronic obstructive pulmonary disease) ([facility])    Coronary artery disease    Diabetes mellitus without complication ([place])    Hypercholesteremia unk   Hypertension    Sarcoidosis    Sleep apnea    does not use CPAP regularly.   Stroke [facility])     PAST SURGICAL HISTORY:   Past Surgical History:  Procedure Laterality Date   APPENDECTOMY     PARTIAL  NEPHRECTOMY      SOCIAL HISTORY:   Social History   Tobacco Use   Smoking status: Current Every Day Smoker    Packs/day: 0.25    Years: 25.00    Pack years: 6.25    Types: Cigarettes    Last attempt to quit: 02/05/2015    Years since quitting: 3.4   Smokeless tobacco: Never Used  Substance Use Topics   Alcohol use: Yes    Alcohol/week: 0.0 standard drinks    Comment: occassional    FAMILY HISTORY:   Family History  Problem Relation Age of Onset   Hypertension Mother    Heart disease Mother    Diabetes Mother    Cancer Father     DRUG ALLERGIES:   Allergies  Allergen Reactions   Penicillins Anaphylaxis and Other (See Comments)    Has patient had a PCN reaction causing immediate rash, facial/tongue/throat swelling, SOB or lightheadedness with hypotension: Yes Has patient had a PCN reaction causing severe rash involving mucus membranes or skin necrosis: No Has patient had a PCN reaction that required hospitalization: No Has patient had a PCN reaction occurring within the last 10 years: No If all of the above answers are "NO", then may proceed with Cephalosporin use.     REVIEW OF SYSTEMS:   Review of Systems  Constitutional: Negative for chills, fever and malaise/fatigue.  HENT: Negative for congestion and sore throat.   Eyes: Negative for blurred vision and  double vision.  Respiratory: Positive for shortness of breath (with exertion) and wheezing. Negative for cough.   Cardiovascular: Positive for orthopnea and leg swelling (trace bilateral). Negative for chest pain and palpitations.  Gastrointestinal: Negative for abdominal pain, blood in stool, constipation, diarrhea, heartburn, nausea and vomiting.  Genitourinary: Negative for dysuria, flank pain and hematuria.  Musculoskeletal: Negative for back pain, falls, joint pain and myalgias.  Skin: Negative.  Negative for itching and rash.  Neurological: Negative for dizziness, sensory change, seizures, loss  of consciousness, weakness and headaches.  Psychiatric/Behavioral: Negative.  Negative for depression.    MEDICATIONS AT HOME:   Prior to Admission medications   Medication Sig Start Date End Date Taking? Authorizing Provider  albuterol (PROVENTIL) (2.5 MG/3ML) 0.083% nebulizer solution Take 2.5 mg by nebulization every 6 (six) hours as needed for wheezing or shortness of breath.   Yes [provider]  amLODipine (NORVASC) 5 MG tablet Take 5 mg by mouth daily. 03/12/16  Yes [provider]  apixaban (ELIQUIS) 5 MG TABS tablet Take 5 mg by mouth 2 (two) times daily. 06/11/16  Yes [provider]  aspirin 81 MG chewable tablet Chew 81 mg by mouth daily.   Yes [provider]  atorvastatin (LIPITOR) 40 MG tablet Take 40 mg by mouth at bedtime.    Yes [provider]  cyclobenzaprine (FLEXERIL) 10 MG tablet Take 1 tablet (10 mg total) by mouth 3 (three) times daily as needed for muscle spasms. 10/24/17  Yes Triplett, Cari B, FNP  Fluticasone-Salmeterol (ADVAIR) 250-50 MCG/DOSE AEPB Inhale 1 puff into the lungs 2 (two) times daily.   Yes [provider]  furosemide (LASIX) 80 MG tablet Take 1 tablet (80 mg total) by mouth daily. Patient taking differently: Take 40 mg by mouth daily as needed.  05/10/16  Yes Hackney, Otila Kluver A, FNP  insulin detemir (LEVEMIR) 100 UNIT/ML injection Inject 40-50 Units into the skin at bedtime.    Yes [provider]  insulin lispro (HUMALOG) 100 UNIT/ML injection Inject 12-15 Units into the skin 3 (three) times daily with meals. Sliding scale as needed   Yes [provider]  ipratropium-albuterol (DUONEB) 0.5-2.5 (3) MG/3ML SOLN Take 3 mLs by nebulization 4 (four) times daily. Patient taking differently: Take 3 mLs by nebulization 4 (four) times daily as needed (shortness of breath / wheezing).  02/21/15  Yes Theodoro Grist, MD  KLOR-CON M20 20 MEQ tablet TAKE ONE TABLET BY MOUTH TWICE DAILY Patient taking  differently: Take 20 mEq by mouth 2 (two) times daily.  08/20/16  Yes Hackney, Tina A, FNP  lisinopril (ZESTRIL) 30 MG tablet Take 20 mg by mouth at bedtime.    Yes [provider]  Melatonin 5 MG TABS Take 5 mg by mouth at bedtime as needed for sleep.   Yes [provider]  methocarbamol (ROBAXIN) 500 MG tablet 1-2 tablets qid prn muscle spasms 07/22/16  Yes Letitia Neri L, PA-C  metoprolol tartrate (LOPRESSOR) 100 MG tablet Take 100 mg by mouth 2 (two) times daily.    Yes [provider]  theophylline (THEODUR) 300 MG 12 hr tablet Take 300 mg by mouth 2 (two) times daily.    Yes [provider]  tiotropium (SPIRIVA) 18 MCG inhalation capsule Place 18 mcg into inhaler and inhale daily.   Yes [provider]  fluticasone (FLONASE) 50 MCG/ACT nasal spray Place 2 sprays into both nostrils daily. Patient not taking: Reported on 07/28/2018 07/03/16   Vaughan Basta, MD  guaiFENesin (MUCINEX) 600 MG 12 hr tablet Take 1 tablet (600 mg total) by mouth 2 (two) times daily as needed. Patient not taking: Reported on 07/28/2018 02/06/18   Demetrios Loll, MD  isosorbide dinitrate (ISORDIL) 10 MG tablet Take 1 tablet (10 mg total) by mouth 3 (three) times daily. Patient not taking: Reported on 07/28/2018 04/01/15   Alisa Graff, FNP  predniSONE (DELTASONE) 20 MG tablet Take 2 tablets (40 mg total) by mouth daily with breakfast. Patient not taking: Reported on 07/28/2018 02/06/18   Demetrios Loll, MD  traMADol (ULTRAM) 50 MG tablet Take 1 tablet (50 mg total) by mouth every 6 (six) hours as needed. Patient not taking: Reported on 07/28/2018 10/24/17   Sherrie George B, FNP      VITAL SIGNS:  Blood pressure (!) 126/94, pulse 88, temperature 99.1 F (37.3 C), temperature source Oral, resp. rate 20, height 6' 0.5" (1.842 m), weight 126.8 kg, SpO2 92 %.  PHYSICAL EXAMINATION:  Physical Exam  GENERAL:  48 y.o.-year-old patient lying in the bed with no acute distress.    EYES: Pupils equal, round, reactive to light and accommodation. No scleral icterus. Extraocular muscles intact.  HEENT: Head atraumatic, normocephalic. Oropharynx and nasopharynx clear.  NECK:  Supple, no jugular venous distention. No thyroid enlargement, no tenderness.  LUNGS: Bilateral expiratory wheezes. No use of accessory muscles of respiration.  CARDIOVASCULAR: Regular rate and rhythm, S1, S2 normal. No murmurs, rubs, or gallops.  ABDOMEN: Soft, nondistended, nontender. Bowel sounds present. No organomegaly or mass.  EXTREMITIES: 1+ pitting bilateral pedal edema NEUROLOGIC: Cranial nerves II through XII are intact. Muscle strength 5/5 in all extremities. Sensation intact. Gait not checked.  PSYCHIATRIC: The patient is alert and oriented x 3.  Normal affect and good eye contact. SKIN: No obvious rash, lesion, or ulcer.   LABORATORY PANEL:   CBC Recent Labs  Lab 07/28/18 1456  WBC 7.3  HGB 16.4  HCT 51.5  PLT 196   ------------------------------------------------------------------------------------------------------------------  Chemistries  Recent Labs  Lab 07/28/18 1456  NA 141  K 4.2  CL 103  CO2 30  GLUCOSE 123*  BUN 15  CREATININE 1.26*  CALCIUM 8.4*   ------------------------------------------------------------------------------------------------------------------  Cardiac Enzymes Recent Labs  Lab 07/28/18 1811  TROPONINI 0.04*   ------------------------------------------------------------------------------------------------------------------  RADIOLOGY:  Dg Chest 2 View  Result Date: 07/28/2018 CLINICAL DATA:  Difficulty breathing and chest tightness. EXAM: CHEST - 2 VIEW COMPARISON:  Chest CT 02/03/2018, chest radiograph 02/03/2018 FINDINGS: Cardiomediastinal silhouette is normal. Mediastinal contours appear intact. Persistent elevation of the left hemidiaphragm. There is no evidence of focal airspace consolidation, pleural effusion or pneumothorax.  Scattered areas of pulmonary fibrosis/scarring, stable. Osseous structures are without acute abnormality. Soft tissues are grossly normal. IMPRESSION: No active cardiopulmonary disease. Bilateral stable areas of pulmonary fibrosis/scarring. Electronically Signed   By: Fidela Salisbury M.D.   On: 07/28/2018 17:12      IMPRESSION AND PLAN:   1. acute exacerbation COPD - Spiriva and Brio Ellipta continued - IV Solu-Medrol every 6 hours - DuoNebs every 6 hours as needed for shortness of breath or cough - O2 per nasal cannula as needed  2. chronic diastolic CHF--ejection fraction 60% on echocardiogram 2017 - With troponin elevated 0.04 - BNP is pending - No evidence of exacerbation on chest x-ray - We will continue every 6 hours troponins x3 -Telemetry monitoring --Patient is on Eliquis for stroke prevention with a history of paroxysmal atrial fibrillation  3.  Obstructive sleep apnea - Patient will have  CPAP during hours of sleep  4.  Diabetes mellitus --Hemoglobin A1c pending - Sensitive sliding scale insulin  DVT and PPI prophylaxis initiated    All the records are reviewed and case discussed with ED provider. The plan of care was discussed in details with the patient (and family). I answered all questions. The patient agreed to proceed with the above mentioned plan. Further management will depend upon hospital course.   CODE STATUS: Full code  TOTAL TIME TAKING CARE OF THIS PATIENT: 45 minutes.    [place] on 07/29/2018 at 12:43 AM  Pager - [phone]  After 6pm go to [email or web address] - [employer] [facility]  Office  [phone]  CC: Primary care physician; [place]   Note: This dictation was prepared with Dragon dictation along with smaller Company secretary. Any transcriptional errors that result from this process are unintentional.

## 2018-07-29 NOTE — Progress Notes (Signed)
Hospitalist paged for clarification of Levemir order (40-50 U?); need specific dosage. Awaiting callback. Barbaraann Faster, RN 2:22 AM; 07/29/2018

## 2018-07-30 LAB — GLUCOSE, CAPILLARY
Glucose-Capillary: 269 mg/dL — ABNORMAL HIGH (ref 70–99)
Glucose-Capillary: 217 mg/dL — ABNORMAL HIGH (ref 70–99)

## 2018-07-30 LAB — HIV ANTIBODY (ROUTINE TESTING W REFLEX): HIV Screen 4th Generation wRfx: NONREACTIVE

## 2018-07-30 MED ORDER — METOPROLOL TARTRATE 100 MG PO TABS: 100.0000 mg | ORAL_TABLET | Freq: Two times a day (BID) | ORAL | 2 refills | Status: DC

## 2018-07-30 MED ORDER — ALBUTEROL SULFATE (2.5 MG/3ML) 0.083% IN NEBU: 2.5000 mg | INHALATION_SOLUTION | Freq: Four times a day (QID) | RESPIRATORY_TRACT | 10 refills | Status: DC | PRN

## 2018-07-30 MED ORDER — PREDNISONE 10 MG PO TABS: ORAL_TABLET | ORAL | 0 refills | Status: DC

## 2018-07-30 NOTE — Discharge Instructions (Signed)
Use your oxygen as per instructions  

## 2018-07-30 NOTE — Progress Notes (Signed)
Patients O2 saturation on room air at rest was 85%, we put him back on 2L O2 and sats up to 94%.

## 2018-07-30 NOTE — Clinical Social Work Note (Signed)
Patient suffers from bronchiectasis which has required multiple hospital readmissions within the past year requiring IV antibiotic therapy. Patient has had daily productive cough for 6 months. Patient has been tried and failed on flutter valve and other breathing techniques. Patient would benefit from afflovest therapy.

## 2018-07-30 NOTE — TOC Transition Note (Signed)
Transition of Care [facility]) - CM/SW Discharge Note   Patient Details  Name: [name] MRN: [id number] Date of Birth: Nov 07, 1970  Transition of Care [facility]) CM/SW Contact:  Shela Leff, LCSW Phone Number: 07/30/2018, 2:41 PM   Clinical Narrative:   Patient discharging today. CSW informed by nurse that patient may only have prn oxygen at home. Brad with [employer] spoke with patient and he has continuous oxygen at home but chooses to wear it as needed. Brad requested a PFT ordered prior to his discharge so that patient's current bipap.could potentially be replaced as it is malfunctioning.     Final next level of care: Home/Self Care Barriers to Discharge: No Barriers Identified   Patient Goals and CMS Choice Patient states their goals for this hospitalization and ongoing recovery are:: to breathe better after the steroids CMS Medicare.gov Compare Post Acute Care list provided to:: Patient    Discharge Placement                       Discharge Plan and Services   Discharge Planning Services: CM Consult                                 Social Determinants of Health (SDOH) Interventions     Readmission Risk Interventions Readmission Risk Prevention Plan 07/30/2018 07/29/2018  Transportation Screening Complete Complete  PCP or Specialist Appt within 3-5 Days Complete Complete  HRI or Home Care Consult Complete Complete  Social Work Consult for [place] Planning/Counseling - Patient refused  Palliative Care Screening Not Applicable Not Applicable  Medication Review [employer] photographer) Complete Complete  Some recent data might be hidden

## 2018-07-30 NOTE — Clinical Social Work Note (Signed)
Patient continues to exhibit signs of hypercapnia associated with chronic respiratory failure secondary to severe COPD. Patient requires the use of NIV both QHS and daytime to help with exacerbation periods. The use of the NIV will treat patient's high PCO2 levels and can reduce risk of exacerbations and future hospitalizations when used at night and during the day. Patient will need these advanced settings in conjunction with his current medication regimen: BIPAP is not an option due to its functional limitations and the severity of the patient's condition. Failure to have NIV available for use over a 24 hour period could lead to death.

## 2018-07-30 NOTE — Progress Notes (Signed)
MD ordered patient to be discharged home.  Discharge instructions were reviewed with the patient and he voiced understanding.  Follow-up appointment was made.  Prescriptions sent to patients' pharmacy.  IV was removed with catheter intact.  All patients questions were answered.  Patient left via wheelchair escorted by nursing.

## 2018-07-30 NOTE — Progress Notes (Signed)
Bedside spirometry completed with patient.     Trial 1     Trial 2    Trial3 FEV1                0.79        0.82       0.82     21% predicted  FVC   1.77     1.68       1.60     38% Predicted  *Best effort in bold  Patient was cooperative and followed directions well. Patient performed testing sitting up on the side of his bed. He gave his best effort at testing and met reproducibility with both FEV1 and FVC testing.

## 2018-07-30 NOTE — Discharge Summary (Addendum)
[place] [facility] [place] NAME: [name]    MR#:  [id number]  DATE OF BIRTH:  04/26/1970  DATE OF ADMISSION:  07/28/2018 ADMITTING PHYSICIAN: Christel Mormon, MD  DATE OF DISCHARGE: 07/30/2018  PRIMARY CARE PHYSICIAN: [place]    ADMISSION DIAGNOSIS:  Hypoxia [R09.02] COPD exacerbation (HCC) [J44.1]  DISCHARGE DIAGNOSIS:  Acute on chronic hypoxic respiratory failure due to severe COPD exacerbation  SECONDARY DIAGNOSIS:   Past Medical History:  Diagnosis Date  . CHF (congestive heart failure) ([place])   . COPD (chronic obstructive pulmonary disease) ([place])   . Coronary artery disease   . Diabetes mellitus without complication ([place])   . Hypercholesteremia unk  . Hypertension   . Sarcoidosis   . Sleep apnea    does not use CPAP regularly.  . Stroke [facility])     HOSPITAL COURSE:  [name] a48 y.o.malewith a known history of COPD, hyperlipidemia, CHF, obstructive sleep apnea, diabetes mellitus. He presented to the emergency room complaining of 6-day history of increased shortness of breath and chest tightness present with exertion such as 20 feet ambulation on a flat surface.   1.acute on chronic exacerbation COPD -Spiriva and Brio Ellipta continued -IV Solu-Medrol every 12 hourly -- >Change to po prednisone taper -DuoNebs every 6 hours as needed for shortness of breath or cough -O2 per nasal cannula . Pt uses oxygen at home -pt has h/o bronchiectasis (chronic) which has required multiple hospital readmissions within the past year requiring IV antibiotic therapy. Patient has had daily productive cough for 6 months. Patient has been tried and failed on flutter valve and other breathing techniques. Patient would benefit from afflovest therapy  2.chronic diastolic CHF--ejection fraction 60% on echocardiogram 2017 -With troponin elevated 0.04 -BNP is 131 -No evidence of exacerbation on chest  x-ray --Patient is on Eliquis for stroke prevention with a history of paroxysmal atrial fibrillation  3. Obstructive sleep apnea  -Patient will have CPAP during hours of sleep  4. Diabetes mellitus --Hemoglobin A1c 6.7 -Sensitive sliding scale insulin -cont po home meds and insulin  DVT and PPI prophylaxis initiated  Patient feels a whole lot better. Will discharge to home with outpatient follow-up with pulmonologist and PCP  CONSULTS OBTAINED:    DRUG ALLERGIES:   Allergies  Allergen Reactions  . Penicillins Anaphylaxis and Other (See Comments)    Has patient had a PCN reaction causing immediate rash, facial/tongue/throat swelling, SOB or lightheadedness with hypotension: Yes Has patient had a PCN reaction causing severe rash involving mucus membranes or skin necrosis: No Has patient had a PCN reaction that required hospitalization: No Has patient had a PCN reaction occurring within the last 10 years: No If all of the above answers are "NO", then may proceed with Cephalosporin use.     DISCHARGE MEDICATIONS:   Allergies as of 07/30/2018      Reactions   Penicillins Anaphylaxis, Other (See Comments)   Has patient had a PCN reaction causing immediate rash, facial/tongue/throat swelling, SOB or lightheadedness with hypotension: Yes Has patient had a PCN reaction causing severe rash involving mucus membranes or skin necrosis: No Has patient had a PCN reaction that required hospitalization: No Has patient had a PCN reaction occurring within the last 10 years: No If all of the above answers are "NO", then may proceed with Cephalosporin use.      Medication List    STOP taking these medications   fluticasone 50 MCG/ACT nasal spray Commonly  known as: FLONASE   guaiFENesin 600 MG 12 hr tablet Commonly known as: MUCINEX   ipratropium-albuterol 0.5-2.5 (3) MG/3ML Soln Commonly known as: DUONEB   isosorbide dinitrate 10 MG tablet Commonly known as: ISORDIL    traMADol 50 MG tablet Commonly known as: Ultram     TAKE these medications   albuterol (2.5 MG/3ML) 0.083% nebulizer solution Commonly known as: PROVENTIL Take 3 mLs (2.5 mg total) by nebulization every 6 (six) hours as needed for wheezing or shortness of breath.   amLODipine 5 MG tablet Commonly known as: NORVASC Take 5 mg by mouth daily.   apixaban 5 MG Tabs tablet Commonly known as: ELIQUIS Take 5 mg by mouth 2 (two) times daily.   aspirin 81 MG chewable tablet Chew 81 mg by mouth daily.   atorvastatin 40 MG tablet Commonly known as: LIPITOR Take 40 mg by mouth at bedtime.   cyclobenzaprine 10 MG tablet Commonly known as: FLEXERIL Take 1 tablet (10 mg total) by mouth 3 (three) times daily as needed for muscle spasms.   Fluticasone-Salmeterol 250-50 MCG/DOSE Aepb Commonly known as: ADVAIR Inhale 1 puff into the lungs 2 (two) times daily.   furosemide 80 MG tablet Commonly known as: LASIX Take 1 tablet (80 mg total) by mouth daily. What changed:   how much to take  when to take this  reasons to take this   insulin detemir 100 UNIT/ML injection Commonly known as: LEVEMIR Inject 40-50 Units into the skin at bedtime.   insulin lispro 100 UNIT/ML injection Commonly known as: HUMALOG Inject 12-15 Units into the skin 3 (three) times daily with meals. Sliding scale as needed   Klor-Con M20 20 MEQ tablet Generic drug: potassium chloride SA TAKE ONE TABLET BY MOUTH TWICE DAILY What changed: how much to take   lisinopril 30 MG tablet Commonly known as: ZESTRIL Take 20 mg by mouth at bedtime.   Melatonin 5 MG Tabs Take 5 mg by mouth at bedtime as needed for sleep.   methocarbamol 500 MG tablet Commonly known as: Robaxin 1-2 tablets qid prn muscle spasms   metoprolol tartrate 100 MG tablet Commonly known as: LOPRESSOR Take 1 tablet (100 mg total) by mouth 2 (two) times daily.   predniSONE 10 MG tablet Commonly known as: DELTASONE Take 50 mg daily--taper  by 10 mg daily then stop Start taking on: July 31, 2018 What changed:   medication strength  how much to take  how to take this  when to take this  additional instructions   theophylline 300 MG 12 hr tablet Commonly known as: THEODUR Take 300 mg by mouth 2 (two) times daily.   tiotropium 18 MCG inhalation capsule Commonly known as: SPIRIVA Place 18 mcg into inhaler and inhale daily.       If you experience worsening of your admission symptoms, develop shortness of breath, life threatening emergency, suicidal or homicidal thoughts you must seek medical attention immediately by calling 911 or calling your MD immediately  if symptoms less severe.  You Must read complete instructions/literature along with all the possible adverse reactions/side effects for all the Medicines you take and that have been prescribed to you. Take any new Medicines after you have completely understood and accept all the possible adverse reactions/side effects.   Please note  You were cared for by a hospitalist during your hospital stay. If you have any questions about your discharge medications or the care you received while you were in the hospital after you are discharged,  you can call the unit and asked to speak with the hospitalist on call if the hospitalist that took care of you is not available. Once you are discharged, your primary care physician will handle any further medical issues. Please note that NO REFILLS for any discharge medications will be authorized once you are discharged, as it is imperative that you return to your primary care physician (or establish a relationship with a primary care physician if you do not have one) for your aftercare needs so that they can reassess your need for medications and monitor your lab values. Today   SUBJECTIVE   I feel better today  VITAL SIGNS:  Blood pressure (!) 152/97, pulse 87, temperature 98.6 F (37 C), temperature source Oral, resp. rate 17,  height 6' 0.5" (1.842 m), weight 126.8 kg, SpO2 94 %.  I/O:    Intake/Output Summary (Last 24 hours) at 07/30/2018 1313 Last data filed at 07/30/2018 0755 Gross per 24 hour  Intake 460 ml  Output 600 ml  Net -140 ml    PHYSICAL EXAMINATION:  GENERAL:  48 y.o.-year-old patient lying in the bed with no acute distress. obese EYES: Pupils equal, round, reactive to light and accommodation. No scleral icterus. Extraocular muscles intact.  HEENT: Head atraumatic, normocephalic. Oropharynx and nasopharynx clear.  NECK:  Supple, no jugular venous distention. No thyroid enlargement, no tenderness.  LUNGS: Normal breath sounds bilaterally, no wheezing, rales,rhonchi or crepitation. No use of accessory muscles of respiration.  CARDIOVASCULAR: S1, S2 normal. No murmurs, rubs, or gallops.  ABDOMEN: Soft, non-tender, non-distended. Bowel sounds present. No organomegaly or mass.  EXTREMITIES: No pedal edema, cyanosis, or clubbing.  NEUROLOGIC: Cranial nerves II through XII are intact. Muscle strength 5/5 in all extremities. Sensation intact. Gait not checked.  PSYCHIATRIC: The patient is alert and oriented x 3.  SKIN: No obvious rash, lesion, or ulcer.   DATA REVIEW:   CBC  Recent Labs  Lab 07/29/18 0252  WBC 5.5  HGB 16.8  HCT 53.6*  PLT 195    Chemistries  Recent Labs  Lab 07/29/18 0252  NA 140  K 4.7  CL 98  CO2 34*  GLUCOSE 247*  BUN 18  CREATININE 1.49*  CALCIUM 8.4*    Microbiology Results   Recent Results (from the past 240 hour(s))  SARS Coronavirus 2 (CEPHEID- Performed in [place] hospital lab), Hosp Order     Status: None   Collection Time: 07/28/18  6:46 PM   Specimen: Nasopharyngeal Swab  Result Value Ref Range Status   SARS Coronavirus 2 NEGATIVE NEGATIVE Final    Comment: (NOTE) If result is NEGATIVE SARS-CoV-2 target nucleic acids are NOT DETECTED. The SARS-CoV-2 RNA is generally detectable in upper and lower  respiratory specimens during the acute  phase of infection. The lowest  concentration of SARS-CoV-2 viral copies this assay can detect is 250  copies / mL. A negative result does not preclude SARS-CoV-2 infection  and should not be used as the sole basis for treatment or other  patient management decisions.  A negative result may occur with  improper specimen collection / handling, submission of specimen other  than nasopharyngeal swab, presence of viral mutation(s) within the  areas targeted by this assay, and inadequate number of viral copies  (<250 copies / mL). A negative result must be combined with clinical  observations, patient history, and epidemiological information. If result is POSITIVE SARS-CoV-2 target nucleic acids are DETECTED. The SARS-CoV-2 RNA is generally detectable in upper and  lower  respiratory specimens dur ing the acute phase of infection.  Positive  results are indicative of active infection with SARS-CoV-2.  Clinical  correlation with patient history and other diagnostic information is  necessary to determine patient infection status.  Positive results do  not rule out bacterial infection or co-infection with other viruses. If result is PRESUMPTIVE POSTIVE SARS-CoV-2 nucleic acids MAY BE PRESENT.   A presumptive positive result was obtained on the submitted specimen  and confirmed on repeat testing.  While 2019 novel coronavirus  (SARS-CoV-2) nucleic acids may be present in the submitted sample  additional confirmatory testing may be necessary for epidemiological  and / or clinical management purposes  to differentiate between  SARS-CoV-2 and other Sarbecovirus currently known to infect humans.  If clinically indicated additional testing with an alternate test  methodology [phone]) is advised. The SARS-CoV-2 RNA is generally  detectable in upper and lower respiratory sp ecimens during the acute  phase of infection. The expected result is Negative. Fact Sheet for Patients:   StrictlyIdeas.no Fact Sheet for Healthcare Providers: BankingDealers.co.za This test is not yet approved or cleared by the [place] FDA and has been authorized for detection and/or diagnosis of SARS-CoV-2 by FDA under an Emergency Use Authorization (EUA).  This EUA will remain in effect (meaning this test can be used) for the duration of the COVID-19 declaration under Section 564(b)(1) of the Act, 21 U.S.C. section 360bbb-3(b)(1), unless the authorization is terminated or revoked sooner. Performed at [facility], [address], [place], [place] [address]     RADIOLOGY:  Dg Chest 2 View  Result Date: 07/28/2018 CLINICAL DATA:  Difficulty breathing and chest tightness. EXAM: CHEST - 2 VIEW COMPARISON:  Chest CT 02/03/2018, chest radiograph 02/03/2018 FINDINGS: Cardiomediastinal silhouette is normal. Mediastinal contours appear intact. Persistent elevation of the left hemidiaphragm. There is no evidence of focal airspace consolidation, pleural effusion or pneumothorax. Scattered areas of pulmonary fibrosis/scarring, stable. Osseous structures are without acute abnormality. Soft tissues are grossly normal. IMPRESSION: No active cardiopulmonary disease. Bilateral stable areas of pulmonary fibrosis/scarring. Electronically Signed   By: Fidela Salisbury M.D.   On: 07/28/2018 17:12     CODE STATUS:     Code Status Orders  (From admission, onward)         Start     Ordered   07/29/18 0035  Full code  Continuous     07/29/18 0034        Code Status History    Date Active Date Inactive Code Status Order ID Comments User Context   02/03/2018 2201 02/06/2018 1552 Full Code [id number]  Dustin Flock, MD Inpatient   07/15/2016 0632 07/16/2016 1741 Full Code [id number]  Saundra Shelling, MD Inpatient   07/01/2016 1701 07/03/2016 1853 Full Code [id number]  Henreitta Leber, MD Inpatient   03/26/2016 0043 03/28/2016 2001 Full Code  [id number]  Hugelmeyer, Alexis, DO Inpatient   11/17/2015 1131 11/18/2015 1845 Full Code [id number]  Bettey Costa, MD Inpatient   02/11/2015 1417 02/21/2015 1841 Full Code [id number]  Henreitta Leber, MD Inpatient   12/28/2014 2134 12/30/2014 1602 Full Code [id number]  Fritzi Mandes, MD Inpatient   08/24/2014 0245 08/26/2014 1819 Full Code [id number]  Lytle Butte, MD ED   08/14/2014 1031 08/18/2014 1830 Full Code [id number]  Vaughan Basta, MD Inpatient   Advance Care Planning Activity      TOTAL TIME TAKING CARE OF THIS PATIENT: *40* minutes.    Fritzi Mandes M.D on 07/30/2018 at 1:13  PM  Between 7am to 6pm - Pager - [phone] After 6pm go to [email or web address] - password [email or web address] [facility] [facility]  Office  [phone]  CC: Primary care physician; [place]

## 2018-07-30 NOTE — Progress Notes (Signed)
Occupational Therapy Treatment Patient Details Name: [name] MRN: [id number] DOB: [birth year] Today's Date: 07/30/2018      OT follow-up with the patient regarding BUE HEP, and energy conservation, work simplification. Pt. has no additional questions, or concerns about HEPs, or handouts. No further OT services are warranted at this time. Will complete the order.                     Harrel Carina, MS< OTR/L 07/30/2018, 3:20 PM

## 2018-07-31 ENCOUNTER — Encounter: Payer: Self-pay | Admitting: Intensive Care

## 2018-07-31 ENCOUNTER — Other Ambulatory Visit: Payer: Self-pay

## 2018-07-31 ENCOUNTER — Emergency Department
Admission: EM | Admit: 2018-07-31 | Discharge: 2018-07-31 | Disposition: A | Discharge status: Home / Self Care | Payer: Medicare Other | Attending: Emergency Medicine | Admitting: Emergency Medicine

## 2018-07-31 ENCOUNTER — Emergency Department: Payer: Medicare Other

## 2018-07-31 DIAGNOSIS — I110 Hypertensive heart disease with heart failure: Secondary | ICD-10-CM | POA: Diagnosis not present

## 2018-07-31 DIAGNOSIS — J449 Chronic obstructive pulmonary disease, unspecified: Secondary | ICD-10-CM | POA: Insufficient documentation

## 2018-07-31 DIAGNOSIS — Z7982 Long term (current) use of aspirin: Secondary | ICD-10-CM | POA: Diagnosis not present

## 2018-07-31 DIAGNOSIS — E119 Type 2 diabetes mellitus without complications: Secondary | ICD-10-CM | POA: Diagnosis not present

## 2018-07-31 DIAGNOSIS — Z79899 Other long term (current) drug therapy: Secondary | ICD-10-CM | POA: Insufficient documentation

## 2018-07-31 DIAGNOSIS — I5042 Chronic combined systolic (congestive) and diastolic (congestive) heart failure: Secondary | ICD-10-CM | POA: Diagnosis not present

## 2018-07-31 DIAGNOSIS — M5432 Sciatica, left side: Secondary | ICD-10-CM

## 2018-07-31 DIAGNOSIS — Z8673 Personal history of transient ischemic attack (TIA), and cerebral infarction without residual deficits: Secondary | ICD-10-CM | POA: Diagnosis not present

## 2018-07-31 DIAGNOSIS — Z794 Long term (current) use of insulin: Secondary | ICD-10-CM | POA: Insufficient documentation

## 2018-07-31 DIAGNOSIS — F17210 Nicotine dependence, cigarettes, uncomplicated: Secondary | ICD-10-CM | POA: Insufficient documentation

## 2018-07-31 DIAGNOSIS — M79605 Pain in left leg: Secondary | ICD-10-CM | POA: Diagnosis present

## 2018-07-31 MED ORDER — ORPHENADRINE CITRATE 30 MG/ML IJ SOLN
60.0000 mg | Freq: Two times a day (BID) | INTRAMUSCULAR | Status: DC
  Administered 2018-07-31: 60 mg via INTRAMUSCULAR
  Filled 2018-07-31: qty 2

## 2018-07-31 MED ORDER — KETOROLAC TROMETHAMINE 30 MG/ML IJ SOLN
30.0000 mg | Freq: Once | INTRAMUSCULAR | Status: AC
  Administered 2018-07-31: 30 mg via INTRAMUSCULAR
  Filled 2018-07-31: qty 1

## 2018-07-31 MED ORDER — TIZANIDINE HCL 4 MG PO TABS: 4.0000 mg | ORAL_TABLET | Freq: Three times a day (TID) | ORAL | 0 refills | Status: AC

## 2018-07-31 NOTE — Discharge Instructions (Signed)
Please call your primary care provider for an of appointment if not improving over the next few days.  If symptoms worsen, please return to the emergency department.

## 2018-07-31 NOTE — ED Triage Notes (Signed)
Patient d/c yesterday from [facility]. Patient c/o left calf pain/swelling that started today. Wears 2L O2 chronic

## 2018-07-31 NOTE — ED Provider Notes (Signed)
[facility] Emergency Department Provider Note  ___________________________________________   None    (approximate)  I have reviewed the triage vital signs and the nursing notes.   HISTORY  Chief Complaint Leg Swelling   HPI [name] is a 48 y.o. male who presents to the emergency department for treatment and evaluation of left lower extremity pain. He was discharged from the hospital yesterday after hypoxia. He is now on home oxygen. Pain and swelling started today. He has had sciatica in the past, but doesn't remember if this feels the same. No alleviating measures attempted prior to arrival.     Past Medical History:  Diagnosis Date  . CHF (congestive heart failure) ([place])   . COPD (chronic obstructive pulmonary disease) ([place])   . Coronary artery disease   . Diabetes mellitus without complication ([place])   . Hypercholesteremia unk  . Hypertension   . Sarcoidosis   . Sleep apnea    does not use CPAP regularly.  . Stroke [facility])     Patient Active Problem List   Diagnosis Date Noted  . Acute exacerbation of chronic obstructive pulmonary disease (COPD) ([place]) 07/29/2018  . COPD with acute exacerbation ([place]) 02/03/2018  . COPD (chronic obstructive pulmonary disease) ([place]) 07/16/2016  . Chronic diastolic heart failure ([place]) 03/23/2016  . Hypercarbia   . COPD exacerbation ([place]) 11/17/2015  . HTN (hypertension) 06/14/2015  . Dental caries 03/08/2015  . Morbid obesity ([place]) 02/21/2015  . OSA and COPD overlap syndrome ([place]) 02/21/2015  . Thrombocytopenia ([place]) 02/21/2015  . Hypernatremia 02/21/2015  . Diabetes ([place]) 10/19/2014  . Chronic combined systolic and diastolic congestive heart failure ([place]) 10/18/2014  . Chronic obstructive pulmonary disease ([place]) 10/18/2014  . Sarcoidosis 04/22/2013  . Other and unspecified hyperlipidemia 04/22/2013    Past Surgical History:  Procedure Laterality Date  . APPENDECTOMY    . PARTIAL NEPHRECTOMY       Prior to Admission medications   Medication Sig Start Date End Date Taking? Authorizing Provider  albuterol (PROVENTIL) (2.5 MG/3ML) 0.083% nebulizer solution Take 3 mLs (2.5 mg total) by nebulization every 6 (six) hours as needed for wheezing or shortness of breath. 07/30/18   Fritzi Mandes, MD  amLODipine (NORVASC) 5 MG tablet Take 5 mg by mouth daily. 03/12/16   [provider]  apixaban (ELIQUIS) 5 MG TABS tablet Take 5 mg by mouth 2 (two) times daily. 06/11/16   [provider]  aspirin 81 MG chewable tablet Chew 81 mg by mouth daily.    [provider]  atorvastatin (LIPITOR) 40 MG tablet Take 40 mg by mouth at bedtime.     [provider]  cyclobenzaprine (FLEXERIL) 10 MG tablet Take 1 tablet (10 mg total) by mouth 3 (three) times daily as needed for muscle spasms. 10/24/17   Skarleth Delmonico B, FNP  Fluticasone-Salmeterol (ADVAIR) 250-50 MCG/DOSE AEPB Inhale 1 puff into the lungs 2 (two) times daily.    [provider]  furosemide (LASIX) 80 MG tablet Take 1 tablet (80 mg total) by mouth daily. Patient taking differently: Take 40 mg by mouth daily as needed.  05/10/16   Darylene Price A, FNP  insulin detemir (LEVEMIR) 100 UNIT/ML injection Inject 40-50 Units into the skin at bedtime.     [provider]  insulin lispro (HUMALOG) 100 UNIT/ML injection Inject 12-15 Units into the skin 3 (three) times daily with meals. Sliding scale as needed    [provider]  KLOR-CON M20 20 MEQ tablet  TAKE ONE TABLET BY MOUTH TWICE DAILY Patient taking differently: Take 20 mEq by mouth 2 (two) times daily.  08/20/16   Alisa Graff, FNP  lisinopril (ZESTRIL) 30 MG tablet Take 20 mg by mouth at bedtime.     [provider]  Melatonin 5 MG TABS Take 5 mg by mouth at bedtime as needed for sleep.    [provider]  methocarbamol (ROBAXIN) 500 MG tablet 1-2 tablets qid prn muscle spasms 07/22/16   Letitia Neri L, PA-C  metoprolol  tartrate (LOPRESSOR) 100 MG tablet Take 1 tablet (100 mg total) by mouth 2 (two) times daily. 07/30/18   Fritzi Mandes, MD  predniSONE (DELTASONE) 10 MG tablet Take 50 mg daily--taper by 10 mg daily then stop 07/31/18   Fritzi Mandes, MD  theophylline (THEODUR) 300 MG 12 hr tablet Take 300 mg by mouth 2 (two) times daily.     [provider]  tiotropium (SPIRIVA) 18 MCG inhalation capsule Place 18 mcg into inhaler and inhale daily.    [provider]  tiZANidine (ZANAFLEX) 4 MG tablet Take 1 tablet (4 mg total) by mouth 3 (three) times daily for 14 days. 07/31/18 08/14/18  Victorino Dike, FNP    Allergies Penicillins  Family History  Problem Relation Age of Onset  . Hypertension Mother   . Heart disease Mother   . Diabetes Mother   . Cancer Father     Social History Social History   Tobacco Use  . Smoking status: Current Every Day Smoker    Packs/day: 0.25    Years: 25.00    Pack years: 6.25    Types: Cigarettes    Last attempt to quit: 02/05/2015    Years since quitting: 3.4  . Smokeless tobacco: Never Used  Substance Use Topics  . Alcohol use: Yes    Alcohol/week: 0.0 standard drinks    Comment: occassional  . Drug use: Yes    Types: Marijuana    Comment: last use December 2016    Review of Systems  Constitutional: No fever/chills Eyes: No visual changes. ENT: No sore throat. Cardiovascular: Denies chest pain. Respiratory: Denies shortness of breath. Gastrointestinal: No abdominal pain.  No nausea, no vomiting.  No diarrhea.  No constipation. Genitourinary: Negative for dysuria. Musculoskeletal: Negative for back pain. Positive for left leg pain. Skin: Negative for rash. Neurological: Negative for headaches, focal weakness or numbness.   ____________________________________________   PHYSICAL EXAM:  VITAL SIGNS: ED Triage Vitals  Enc Vitals Group     BP 07/31/18 1409 (!) 147/88     Pulse Rate 07/31/18 1409 93     Resp 07/31/18 1409 18      Temp 07/31/18 1409 98.6 F (37 C)     Temp Source 07/31/18 1409 Oral     SpO2 07/31/18 1408 93 %     Weight 07/31/18 1409 279 lb (126.6 kg)     Height 07/31/18 1409 6' 6.5" (1.994 m)     Head Circumference --      Peak Flow --      Pain Score 07/31/18 1409 7     Pain Loc --      Pain Edu? --      Excl. in [place]? --     Constitutional: Alert and oriented. Well appearing and in no acute distress. Eyes: Conjunctivae are normal. PERRL. EOMI. Head: Atraumatic. Nose: No congestion/rhinnorhea. Mouth/Throat: Mucous membranes are moist.  Oropharynx non-erythematous. Neck: No stridor.   Cardiovascular: Normal rate, regular rhythm.  Grossly normal heart sounds.  Good peripheral circulation. Respiratory: Normal respiratory effort.  No retractions. Lungs CTAB. Gastrointestinal: Soft and nontender. No distention. No abdominal bruits. No CVA tenderness. Musculoskeletal: Calf at largest point (r) measures 50 cm and (l) 51 cm. Neurologic:  Normal speech and language. No gross focal neurologic deficits are appreciated. No gait instability. Skin:  Skin is warm, dry and intact. No rash noted. Psychiatric: Mood and affect are normal. Speech and behavior are normal.  ____________________________________________   LABS (all labs ordered are listed, but only abnormal results are displayed)  Labs Reviewed - No data to display ____________________________________________  EKG  ED ECG REPORT I, Infantof Villagomez, FNP-BC personally viewed and interpreted this ECG.   Date: 06/35/2020  EKG Time: 1434  Rate: 84  Rhythm: normal EKG, normal sinus rhythm, unchanged from previous tracings, normal sinus rhythm  Axis: normal  Intervals:right bundle branch block and left anterior fascicular block  ST&T Change: no ST elevation  ____________________________________________  RADIOLOGY  ED MD interpretation:  No DVT.  Official radiology report(s): No results found.   ____________________________________________   PROCEDURES  Procedure(s) performed (including Critical Care):  Procedures   ____________________________________________   INITIAL IMPRESSION / ASSESSMENT AND PLAN / ED COURSE  As part of my medical decision making, I reviewed the following data within the electronic MEDICAL RECORD NUMBER Notes from prior ED visits     48 year old male presenting to the emergency department for treatment and evaluation of acute onset of left lower extremity pain.  He was recently discharged from the hospital.  There is some concern for DVT since he was recently admitted although he denies any chest pain or shortness of breath.  Ultrasound of the lower extremity will be completed.  Ultrasound of the left lower extremity is negative for DVT.  Patient will be treated with medications appropriate for relief of sciatica.   Patient has improved after Toradol and Norflex.  He will be discharged home with a prescription for Zanaflex.  He is to follow-up with his primary care provider for symptoms that are not improving over the next week or so.  He is to return to the emergency department for any symptom of concern if unable to schedule an appointment.      ____________________________________________   FINAL CLINICAL IMPRESSION(S) / ED DIAGNOSES  Final diagnoses:  Sciatica of left side     ED Discharge Orders         Ordered    tiZANidine (ZANAFLEX) 4 MG tablet  3 times daily     07/31/18 1816           Note:  This document was prepared using Dragon voice recognition software and may include unintentional dictation errors.    Victorino Dike, FNP 08/05/18 2052    Schuyler Amor, MD 08/07/18 [phone]

## 2018-07-31 NOTE — ED Notes (Addendum)
Circumference measurements mid calf: Right calf- 50 cm, Left calf 51 cm

## 2018-11-05 ENCOUNTER — Other Ambulatory Visit: Payer: Self-pay

## 2018-11-05 ENCOUNTER — Inpatient Hospital Stay
Admission: EM | Admit: 2018-11-05 | Discharge: 2018-11-08 | DRG: 293 | Disposition: A | Discharge status: Home / Self Care | Payer: Medicare HMO | Attending: Internal Medicine | Admitting: Internal Medicine

## 2018-11-05 ENCOUNTER — Emergency Department: Payer: Medicare HMO

## 2018-11-05 ENCOUNTER — Encounter: Payer: Self-pay | Admitting: Emergency Medicine

## 2018-11-05 DIAGNOSIS — I110 Hypertensive heart disease with heart failure: Principal | ICD-10-CM | POA: Diagnosis present

## 2018-11-05 DIAGNOSIS — Z905 Acquired absence of kidney: Secondary | ICD-10-CM

## 2018-11-05 DIAGNOSIS — Z79899 Other long term (current) drug therapy: Secondary | ICD-10-CM

## 2018-11-05 DIAGNOSIS — G4733 Obstructive sleep apnea (adult) (pediatric): Secondary | ICD-10-CM | POA: Diagnosis present

## 2018-11-05 DIAGNOSIS — I252 Old myocardial infarction: Secondary | ICD-10-CM

## 2018-11-05 DIAGNOSIS — R0789 Other chest pain: Secondary | ICD-10-CM | POA: Diagnosis not present

## 2018-11-05 DIAGNOSIS — Z794 Long term (current) use of insulin: Secondary | ICD-10-CM

## 2018-11-05 DIAGNOSIS — R079 Chest pain, unspecified: Secondary | ICD-10-CM | POA: Diagnosis present

## 2018-11-05 DIAGNOSIS — J449 Chronic obstructive pulmonary disease, unspecified: Secondary | ICD-10-CM | POA: Diagnosis present

## 2018-11-05 DIAGNOSIS — J441 Chronic obstructive pulmonary disease with (acute) exacerbation: Secondary | ICD-10-CM

## 2018-11-05 DIAGNOSIS — E669 Obesity, unspecified: Secondary | ICD-10-CM | POA: Diagnosis present

## 2018-11-05 DIAGNOSIS — Z7951 Long term (current) use of inhaled steroids: Secondary | ICD-10-CM

## 2018-11-05 DIAGNOSIS — Z9981 Dependence on supplemental oxygen: Secondary | ICD-10-CM

## 2018-11-05 DIAGNOSIS — I5033 Acute on chronic diastolic (congestive) heart failure: Secondary | ICD-10-CM | POA: Diagnosis present

## 2018-11-05 DIAGNOSIS — Z20828 Contact with and (suspected) exposure to other viral communicable diseases: Secondary | ICD-10-CM | POA: Diagnosis present

## 2018-11-05 DIAGNOSIS — D869 Sarcoidosis, unspecified: Secondary | ICD-10-CM | POA: Diagnosis present

## 2018-11-05 DIAGNOSIS — Z880 Allergy status to penicillin: Secondary | ICD-10-CM

## 2018-11-05 DIAGNOSIS — E785 Hyperlipidemia, unspecified: Secondary | ICD-10-CM | POA: Diagnosis present

## 2018-11-05 DIAGNOSIS — Z7982 Long term (current) use of aspirin: Secondary | ICD-10-CM

## 2018-11-05 DIAGNOSIS — Z6837 Body mass index (BMI) 37.0-37.9, adult: Secondary | ICD-10-CM

## 2018-11-05 DIAGNOSIS — Z7901 Long term (current) use of anticoagulants: Secondary | ICD-10-CM

## 2018-11-05 DIAGNOSIS — I2510 Atherosclerotic heart disease of native coronary artery without angina pectoris: Secondary | ICD-10-CM | POA: Diagnosis present

## 2018-11-05 DIAGNOSIS — Z833 Family history of diabetes mellitus: Secondary | ICD-10-CM

## 2018-11-05 DIAGNOSIS — F17210 Nicotine dependence, cigarettes, uncomplicated: Secondary | ICD-10-CM | POA: Diagnosis present

## 2018-11-05 DIAGNOSIS — E119 Type 2 diabetes mellitus without complications: Secondary | ICD-10-CM | POA: Diagnosis present

## 2018-11-05 DIAGNOSIS — Z8673 Personal history of transient ischemic attack (TIA), and cerebral infarction without residual deficits: Secondary | ICD-10-CM

## 2018-11-05 DIAGNOSIS — I509 Heart failure, unspecified: Secondary | ICD-10-CM

## 2018-11-05 DIAGNOSIS — Z8249 Family history of ischemic heart disease and other diseases of the circulatory system: Secondary | ICD-10-CM

## 2018-11-05 HISTORY — DX: Acute myocardial infarction, unspecified: I21.9

## 2018-11-05 LAB — CBC WITH DIFFERENTIAL/PLATELET
Abs Immature Granulocytes: 0.02 10*3/uL (ref 0.00–0.07)
Basophils Absolute: 0 10*3/uL (ref 0.0–0.1)
Basophils Relative: 1 %
Eosinophils Absolute: 0.1 10*3/uL (ref 0.0–0.5)
Eosinophils Relative: 1 %
HCT: 48.1 % (ref 39.0–52.0)
Hemoglobin: 15.1 g/dL (ref 13.0–17.0)
Immature Granulocytes: 0 %
Lymphocytes Relative: 19 %
Lymphs Abs: 1.3 10*3/uL (ref 0.7–4.0)
MCH: 27.6 pg (ref 26.0–34.0)
MCHC: 31.4 g/dL (ref 30.0–36.0)
MCV: 87.8 fL (ref 80.0–100.0)
Monocytes Absolute: 1.1 10*3/uL — ABNORMAL HIGH (ref 0.1–1.0)
Monocytes Relative: 17 %
Neutro Abs: 4.1 10*3/uL (ref 1.7–7.7)
Neutrophils Relative %: 62 %
Platelets: 202 10*3/uL (ref 150–400)
RBC: 5.48 MIL/uL (ref 4.22–5.81)
RDW: 12.6 % (ref 11.5–15.5)
WBC: 6.6 10*3/uL (ref 4.0–10.5)
nRBC: 0 % (ref 0.0–0.2)

## 2018-11-05 LAB — COMPREHENSIVE METABOLIC PANEL
ALT: 20 U/L (ref 0–44)
AST: 22 U/L (ref 15–41)
Albumin: 3.7 g/dL (ref 3.5–5.0)
Alkaline Phosphatase: 77 U/L (ref 38–126)
Anion gap: 7 (ref 5–15)
BUN: 18 mg/dL (ref 6–20)
CO2: 38 mmol/L — ABNORMAL HIGH (ref 22–32)
Calcium: 8.8 mg/dL — ABNORMAL LOW (ref 8.9–10.3)
Chloride: 97 mmol/L — ABNORMAL LOW (ref 98–111)
Creatinine, Ser: 1.53 mg/dL — ABNORMAL HIGH (ref 0.61–1.24)
GFR calc Af Amer: >60 mL/min (ref >60)
GFR calc non Af Amer: 53 mL/min — ABNORMAL LOW (ref >60)
Glucose, Bld: 112 mg/dL — ABNORMAL HIGH (ref 70–99)
Potassium: 4.1 mmol/L (ref 3.5–5.1)
Sodium: 142 mmol/L (ref 135–145)
Total Bilirubin: 0.6 mg/dL (ref 0.3–1.2)
Total Protein: 7.1 g/dL (ref 6.5–8.1)

## 2018-11-05 LAB — TROPONIN I (HIGH SENSITIVITY): Troponin I (High Sensitivity): 48 ng/L — ABNORMAL HIGH (ref <18)

## 2018-11-05 LAB — BRAIN NATRIURETIC PEPTIDE: B Natriuretic Peptide: 77 pg/mL (ref 0.0–100.0)

## 2018-11-05 MED ORDER — IPRATROPIUM-ALBUTEROL 0.5-2.5 (3) MG/3ML IN SOLN
3.0000 mL | RESPIRATORY_TRACT | Status: AC | PRN
  Administered 2018-11-05 – 2018-11-06 (×3): 3 mL via RESPIRATORY_TRACT
  Filled 2018-11-05 (×3): qty 3

## 2018-11-05 MED ORDER — PREDNISONE 20 MG PO TABS
60.0000 mg | ORAL_TABLET | Freq: Once | ORAL | Status: AC
  Administered 2018-11-05: 60 mg via ORAL
  Filled 2018-11-05: qty 3

## 2018-11-05 NOTE — ED Provider Notes (Signed)
[facility] Emergency Department Provider Note   ____________________________________________   First MD Initiated Contact with Patient 11/05/18 2201     (approximate)  I have reviewed the triage vital signs and the nursing notes.   HISTORY  Chief Complaint Chest Pain    HPI [name] is a 48 y.o. male with past medical history of COPD on 2 L nasal cannula, diastolic CHF, CAD, hypertension who presents to the ED complaining of shortness of breath and chest pain.  Patient reports that he has been increasingly short of breath over the past 1 to 2 days and this is associated with some chest tightness.  Tightness in his chest is constant, not exacerbated or alleviated by anything.  He states his cough is similar to what it has been in the past and he denies any fevers.  He has not noticed any pain or swelling in his lower extremities.  He has been using albuterol at home without relief.        Past Medical History:  Diagnosis Date  . CHF (congestive heart failure) ([place])   . COPD (chronic obstructive pulmonary disease) ([place])   . Coronary artery disease   . Diabetes mellitus without complication ([place])   . Hypercholesteremia unk  . Hypertension   . Sarcoidosis   . Sleep apnea    does not use CPAP regularly.  . Stroke [facility])     Patient Active Problem List   Diagnosis Date Noted  . Acute exacerbation of chronic obstructive pulmonary disease (COPD) ([place]) 07/29/2018  . COPD with acute exacerbation ([place]) 02/03/2018  . COPD (chronic obstructive pulmonary disease) ([place]) 07/16/2016  . Chronic diastolic heart failure ([place]) 03/23/2016  . Hypercarbia   . COPD exacerbation ([place]) 11/17/2015  . HTN (hypertension) 06/14/2015  . Dental caries 03/08/2015  . Morbid obesity ([place]) 02/21/2015  . OSA and COPD overlap syndrome ([place]) 02/21/2015  . Thrombocytopenia ([place]) 02/21/2015  . Hypernatremia 02/21/2015  . Diabetes ([place]) 10/19/2014  . Chronic combined  systolic and diastolic congestive heart failure ([place]) 10/18/2014  . Chronic obstructive pulmonary disease ([place]) 10/18/2014  . Sarcoidosis 04/22/2013  . Other and unspecified hyperlipidemia 04/22/2013    Past Surgical History:  Procedure Laterality Date  . APPENDECTOMY    . PARTIAL NEPHRECTOMY      Prior to Admission medications   Medication Sig Start Date End Date Taking? Authorizing Provider  albuterol (PROVENTIL) (2.5 MG/3ML) 0.083% nebulizer solution Take 3 mLs (2.5 mg total) by nebulization every 6 (six) hours as needed for wheezing or shortness of breath. 07/30/18   Fritzi Mandes, MD  amLODipine (NORVASC) 5 MG tablet Take 5 mg by mouth daily. 03/12/16   [provider]  apixaban (ELIQUIS) 5 MG TABS tablet Take 5 mg by mouth 2 (two) times daily. 06/11/16   [provider]  aspirin 81 MG chewable tablet Chew 81 mg by mouth daily.    [provider]  atorvastatin (LIPITOR) 40 MG tablet Take 40 mg by mouth at bedtime.     [provider]  cyclobenzaprine (FLEXERIL) 10 MG tablet Take 1 tablet (10 mg total) by mouth 3 (three) times daily as needed for muscle spasms. 10/24/17   Triplett, Cari B, FNP  Fluticasone-Salmeterol (ADVAIR) 250-50 MCG/DOSE AEPB Inhale 1 puff into the lungs 2 (two) times daily.    [provider]  furosemide (LASIX) 80 MG tablet Take 1 tablet (80 mg total) by mouth daily. Patient taking differently: Take 40 mg by mouth daily as  needed.  05/10/16   Darylene Price A, FNP  insulin detemir (LEVEMIR) 100 UNIT/ML injection Inject 40-50 Units into the skin at bedtime.     [provider]  insulin lispro (HUMALOG) 100 UNIT/ML injection Inject 12-15 Units into the skin 3 (three) times daily with meals. Sliding scale as needed    [provider]  KLOR-CON M20 20 MEQ tablet TAKE ONE TABLET BY MOUTH TWICE DAILY Patient taking differently: Take 20 mEq by mouth 2 (two) times daily.  08/20/16   Alisa Graff, FNP  lisinopril  (ZESTRIL) 30 MG tablet Take 20 mg by mouth at bedtime.     [provider]  Melatonin 5 MG TABS Take 5 mg by mouth at bedtime as needed for sleep.    [provider]  methocarbamol (ROBAXIN) 500 MG tablet 1-2 tablets qid prn muscle spasms 07/22/16   Letitia Neri L, PA-C  metoprolol tartrate (LOPRESSOR) 100 MG tablet Take 1 tablet (100 mg total) by mouth 2 (two) times daily. 07/30/18   Fritzi Mandes, MD  predniSONE (DELTASONE) 10 MG tablet Take 50 mg daily--taper by 10 mg daily then stop 07/31/18   Fritzi Mandes, MD  theophylline (THEODUR) 300 MG 12 hr tablet Take 300 mg by mouth 2 (two) times daily.     [provider]  tiotropium (SPIRIVA) 18 MCG inhalation capsule Place 18 mcg into inhaler and inhale daily.    [provider]    Allergies Penicillins  Family History  Problem Relation Age of Onset  . Hypertension Mother   . Heart disease Mother   . Diabetes Mother   . Cancer Father     Social History Social History   Tobacco Use  . Smoking status: Current Every Day Smoker    Packs/day: 0.25    Years: 25.00    Pack years: 6.25    Types: Cigarettes    Last attempt to quit: 02/05/2015    Years since quitting: 3.7  . Smokeless tobacco: Never Used  Substance Use Topics  . Alcohol use: Yes    Alcohol/week: 0.0 standard drinks    Comment: occassional  . Drug use: Yes    Types: Marijuana    Comment: last use December 2016    Review of Systems  Constitutional: No fever/chills Eyes: No visual changes. ENT: No sore throat. Cardiovascular: Positive for chest pain. Respiratory: Positive for shortness of breath. Gastrointestinal: No abdominal pain.  No nausea, no vomiting.  No diarrhea.  No constipation. Genitourinary: Negative for dysuria. Musculoskeletal: Negative for back pain. Skin: Negative for rash. Neurological: Negative for headaches, focal weakness or numbness.  ____________________________________________   PHYSICAL EXAM:  VITAL  SIGNS: ED Triage Vitals  Enc Vitals Group     BP 11/05/18 2151 (!) 152/89     Pulse Rate 11/05/18 2151 (!) 108     Resp 11/05/18 2151 (!) 22     Temp 11/05/18 2151 97.7 F (36.5 C)     Temp Source 11/05/18 2151 Oral     SpO2 11/05/18 2151 (!) 80 %     Weight 11/05/18 2149 269 lb (122 kg)     Height 11/05/18 2149 6' (1.829 m)     Head Circumference --      Peak Flow --      Pain Score 11/05/18 2149 7     Pain Loc --      Pain Edu? --      Excl. in [place]? --     Constitutional: Alert and oriented.  Eyes: Conjunctivae are normal. Head: Atraumatic. Nose: No congestion/rhinnorhea. Mouth/Throat: Mucous membranes are moist. Neck: Normal ROM Cardiovascular: Normal rate, regular rhythm. Grossly normal heart sounds. Respiratory: Normal respiratory effort.  No retractions.  Inspiratory and expiratory wheezing noted. Gastrointestinal: Soft and nontender. No distention. Genitourinary: deferred Musculoskeletal: No lower extremity tenderness nor edema. Neurologic:  Normal speech and language. No gross focal neurologic deficits are appreciated. Skin:  Skin is warm, dry and intact. No rash noted. Psychiatric: Mood and affect are normal. Speech and behavior are normal.  ____________________________________________   LABS (all labs ordered are listed, but only abnormal results are displayed)  Labs Reviewed  CBC WITH DIFFERENTIAL/PLATELET - Abnormal; Notable for the following components:      Result Value   Monocytes Absolute 1.1 (*)    All other components within normal limits  COMPREHENSIVE METABOLIC PANEL - Abnormal; Notable for the following components:   Chloride 97 (*)    CO2 38 (*)    Glucose, Bld 112 (*)    Creatinine, Ser 1.53 (*)    Calcium 8.8 (*)    GFR calc non Af Amer 53 (*)    All other components within normal limits  TROPONIN I (HIGH SENSITIVITY) - Abnormal; Notable for the following components:   Troponin I (High Sensitivity) 48 (*)    All other components within  normal limits  BRAIN NATRIURETIC PEPTIDE  TROPONIN I (HIGH SENSITIVITY)   ____________________________________________  EKG  ED ECG REPORT I, Blake Divine, the attending physician, personally viewed and interpreted this ECG.   Date: 11/06/2018  EKG Time: 22:04  Rate: 97  Rhythm: normal sinus rhythm  Axis: LAD  Intervals:right bundle branch block and left anterior fascicular block  ST&T Change: None   PROCEDURES  Procedure(s) performed (including Critical Care):  Procedures   ____________________________________________   INITIAL IMPRESSION / ASSESSMENT AND PLAN / ED COURSE       48 year old male with history of CAD and COPD on 2 L nasal cannula presents to the ED complaining of shortness of breath and chest tightness.  Most likely exudation appears to be COPD exacerbation given his description of chest pain and wheezing noted on exam.  Initial O2 sat measured to be 80%, however this was on room air as patient did not bring his oxygen with him.  O2 sats improved immediately with application of 2 L nasal cannula, which is his baseline.  Will treat with steroids and breathing treatments.  Do not suspect PE.  EKG without acute ischemic changes, troponin is elevated to 48, however this is likely related to his chronic kidney disease.  We will plan on checking 2 sets of troponin, if no significant change, doubt ACS as etiology of his chest pain.  His chest x-ray is negative for acute process.  Patient turned over to Dr. Alfred Levins pending repeat troponin and reevaluation following additional breathing treatments.      ____________________________________________   FINAL CLINICAL IMPRESSION(S) / ED DIAGNOSES  Final diagnoses:  COPD exacerbation ([place])  Atypical chest pain     ED Discharge Orders    None       Note:  This document was prepared using Dragon voice recognition software and may include unintentional dictation errors.   Blake Divine, MD 11/06/18 [phone]

## 2018-11-05 NOTE — ED Triage Notes (Addendum)
Pt to triage via w/c with no distress noted, mask in place; pt reports mid CP, nonradiating accomp by [facility] this evening; st hx of same, recently admitted

## 2018-11-06 ENCOUNTER — Other Ambulatory Visit: Payer: Self-pay

## 2018-11-06 DIAGNOSIS — R079 Chest pain, unspecified: Secondary | ICD-10-CM | POA: Diagnosis present

## 2018-11-06 LAB — GLUCOSE, CAPILLARY
Glucose-Capillary: 176 mg/dL — ABNORMAL HIGH (ref 70–99)
Glucose-Capillary: 167 mg/dL — ABNORMAL HIGH (ref 70–99)
Glucose-Capillary: 97 mg/dL (ref 70–99)

## 2018-11-06 LAB — TROPONIN I (HIGH SENSITIVITY)
Troponin I (High Sensitivity): 55 ng/L — ABNORMAL HIGH (ref <18)
Troponin I (High Sensitivity): 34 ng/L — ABNORMAL HIGH (ref <18)
Troponin I (High Sensitivity): 29 ng/L — ABNORMAL HIGH (ref <18)

## 2018-11-06 LAB — TSH: TSH: 0.311 u[IU]/mL — ABNORMAL LOW (ref 0.350–4.500)

## 2018-11-06 LAB — SARS CORONAVIRUS 2 (TAT 6-24 HRS): SARS Coronavirus 2: NEGATIVE

## 2018-11-06 MED ORDER — ASPIRIN 81 MG PO CHEW
81.0000 mg | CHEWABLE_TABLET | Freq: Every day | ORAL | Status: DC
  Administered 2018-11-06 – 2018-11-08 (×3): 81 mg via ORAL
  Filled 2018-11-06 (×3): qty 1

## 2018-11-06 MED ORDER — POTASSIUM CHLORIDE CRYS ER 20 MEQ PO TBCR
20.0000 meq | EXTENDED_RELEASE_TABLET | Freq: Two times a day (BID) | ORAL | Status: DC
  Administered 2018-11-06 – 2018-11-08 (×5): 20 meq via ORAL
  Filled 2018-11-06 (×5): qty 1

## 2018-11-06 MED ORDER — ALBUTEROL SULFATE (2.5 MG/3ML) 0.083% IN NEBU: 2.5000 mg | INHALATION_SOLUTION | Freq: Four times a day (QID) | RESPIRATORY_TRACT | Status: DC | PRN

## 2018-11-06 MED ORDER — FUROSEMIDE 40 MG PO TABS
80.0000 mg | ORAL_TABLET | Freq: Every day | ORAL | Status: DC
  Administered 2018-11-06 – 2018-11-07 (×2): 80 mg via ORAL
  Filled 2018-11-06 (×3): qty 2

## 2018-11-06 MED ORDER — LISINOPRIL 10 MG PO TABS
30.0000 mg | ORAL_TABLET | Freq: Every day | ORAL | Status: DC
  Administered 2018-11-06 – 2018-11-07 (×2): 30 mg via ORAL
  Filled 2018-11-06 (×2): qty 3

## 2018-11-06 MED ORDER — ALBUTEROL SULFATE HFA 108 (90 BASE) MCG/ACT IN AERS
2.0000 | INHALATION_SPRAY | Freq: Four times a day (QID) | RESPIRATORY_TRACT | Status: DC | PRN
  Administered 2018-11-06 – 2018-11-07 (×2): 2 via RESPIRATORY_TRACT
  Filled 2018-11-06: qty 6.7

## 2018-11-06 MED ORDER — METHOCARBAMOL 500 MG PO TABS
500.0000 mg | ORAL_TABLET | Freq: Three times a day (TID) | ORAL | Status: DC | PRN
  Filled 2018-11-06: qty 1

## 2018-11-06 MED ORDER — ACETAMINOPHEN 650 MG RE SUPP: 650.0000 mg | Freq: Four times a day (QID) | RECTAL | Status: DC | PRN

## 2018-11-06 MED ORDER — ENOXAPARIN SODIUM 40 MG/0.4ML ~~LOC~~ SOLN: 40.0000 mg | SUBCUTANEOUS | Status: DC

## 2018-11-06 MED ORDER — TIOTROPIUM BROMIDE MONOHYDRATE 18 MCG IN CAPS
18.0000 ug | ORAL_CAPSULE | Freq: Every day | RESPIRATORY_TRACT | Status: DC
  Administered 2018-11-06 – 2018-11-08 (×3): 18 ug via RESPIRATORY_TRACT
  Filled 2018-11-06: qty 5

## 2018-11-06 MED ORDER — ATORVASTATIN CALCIUM 20 MG PO TABS
40.0000 mg | ORAL_TABLET | Freq: Every day | ORAL | Status: DC
  Administered 2018-11-06 – 2018-11-07 (×2): 40 mg via ORAL
  Filled 2018-11-06 (×2): qty 2

## 2018-11-06 MED ORDER — INSULIN DETEMIR 100 UNIT/ML ~~LOC~~ SOLN
20.0000 [IU] | Freq: Every day | SUBCUTANEOUS | Status: DC
  Administered 2018-11-06 – 2018-11-07 (×2): 20 [IU] via SUBCUTANEOUS
  Filled 2018-11-06 (×3): qty 0.2

## 2018-11-06 MED ORDER — METOPROLOL TARTRATE 50 MG PO TABS
100.0000 mg | ORAL_TABLET | Freq: Two times a day (BID) | ORAL | Status: DC
  Administered 2018-11-06 – 2018-11-08 (×5): 100 mg via ORAL
  Filled 2018-11-06 (×5): qty 2

## 2018-11-06 MED ORDER — FUROSEMIDE 40 MG PO TABS: 80.0000 mg | ORAL_TABLET | Freq: Every day | ORAL | Status: DC | PRN

## 2018-11-06 MED ORDER — APIXABAN 5 MG PO TABS
5.0000 mg | ORAL_TABLET | Freq: Two times a day (BID) | ORAL | Status: DC
  Administered 2018-11-06 – 2018-11-08 (×5): 5 mg via ORAL
  Filled 2018-11-06 (×5): qty 1

## 2018-11-06 MED ORDER — THEOPHYLLINE ER 300 MG PO TB12
300.0000 mg | ORAL_TABLET | Freq: Two times a day (BID) | ORAL | Status: DC
  Administered 2018-11-06 – 2018-11-08 (×5): 300 mg via ORAL
  Filled 2018-11-06 (×6): qty 1

## 2018-11-06 MED ORDER — INSULIN ASPART 100 UNIT/ML ~~LOC~~ SOLN
0.0000 [IU] | Freq: Three times a day (TID) | SUBCUTANEOUS | Status: DC
  Administered 2018-11-06 – 2018-11-08 (×6): 3 [IU] via SUBCUTANEOUS
  Filled 2018-11-06 (×6): qty 1

## 2018-11-06 MED ORDER — MELATONIN 5 MG PO TABS
5.0000 mg | ORAL_TABLET | Freq: Every evening | ORAL | Status: DC | PRN
  Administered 2018-11-06: 23:00:00 5 mg via ORAL
  Filled 2018-11-06 (×2): qty 1

## 2018-11-06 MED ORDER — DOCUSATE SODIUM 100 MG PO CAPS
100.0000 mg | ORAL_CAPSULE | Freq: Two times a day (BID) | ORAL | Status: DC
  Filled 2018-11-06 (×4): qty 1

## 2018-11-06 MED ORDER — ACETAMINOPHEN 325 MG PO TABS
650.0000 mg | ORAL_TABLET | Freq: Four times a day (QID) | ORAL | Status: DC | PRN
  Administered 2018-11-06: 650 mg via ORAL
  Filled 2018-11-06: qty 2

## 2018-11-06 MED ORDER — ONDANSETRON HCL 4 MG/2ML IJ SOLN: 4.0000 mg | Freq: Four times a day (QID) | INTRAMUSCULAR | Status: DC | PRN

## 2018-11-06 MED ORDER — ONDANSETRON HCL 4 MG PO TABS: 4.0000 mg | ORAL_TABLET | Freq: Four times a day (QID) | ORAL | Status: DC | PRN

## 2018-11-06 MED ORDER — MOMETASONE FURO-FORMOTEROL FUM 200-5 MCG/ACT IN AERO
2.0000 | INHALATION_SPRAY | Freq: Two times a day (BID) | RESPIRATORY_TRACT | Status: DC
  Administered 2018-11-06 – 2018-11-08 (×5): 2 via RESPIRATORY_TRACT
  Filled 2018-11-06: qty 8.8

## 2018-11-06 MED ORDER — LABETALOL HCL 5 MG/ML IV SOLN
10.0000 mg | INTRAVENOUS | Status: AC
  Administered 2018-11-06: 08:00:00 10 mg via INTRAVENOUS
  Filled 2018-11-06: qty 4

## 2018-11-06 NOTE — ED Notes (Signed)
Report given to Tammy, RN

## 2018-11-06 NOTE — ED Notes (Signed)
Pt given applesauce, crackers, peanut butter, and apple juice

## 2018-11-06 NOTE — ED Notes (Signed)
Attempt to call report unsuccessful- will call again in 15 minutes

## 2018-11-06 NOTE — H&P (Signed)
[name] is an 48 y.o. male.   Chief Complaint: Chest pain HPI: The patient with past medical history of CHF, COPD, CAD, hypertension, sarcoidosis, diabetes and hyperlipidemia presents to the emergency department complaining of chest pain.  The patient reports centralized chest pain that began while he was washing dishes.  He admits to associated shortness of breath but denies nausea, vomiting or diaphoresis.  He has had sharp pains in his chest before but not while doing an activity with as as little exertion as washing dishes.  His chest discomfort was improved but not completely resolved by the time of arrival in the emergency department.  The patient was given multiple breathing treatments which improved his shortness of breath.  Laboratory evaluation revealed elevated troponin.  Once patient was stabilized emergency department staff called the hospitalist service for admission.  Past Medical History:  Diagnosis Date  . CHF (congestive heart failure) ([place])   . COPD (chronic obstructive pulmonary disease) ([place])   . Coronary artery disease   . Diabetes mellitus without complication ([place])   . Hypercholesteremia unk  . Hypertension   . Sarcoidosis   . Sleep apnea    does not use CPAP regularly.  . Stroke [facility])     Past Surgical History:  Procedure Laterality Date  . APPENDECTOMY    . PARTIAL NEPHRECTOMY      Family History  Problem Relation Age of Onset  . Hypertension Mother   . Heart disease Mother   . Diabetes Mother   . Cancer Father    Social History:  reports that he has been smoking cigarettes. He has a 6.25 pack-year smoking history. He has never used smokeless tobacco. He reports current alcohol use. He reports current drug use. Drug: Marijuana.  Allergies:  Allergies  Allergen Reactions  . Penicillins Anaphylaxis and Other (See Comments)    Has patient had a PCN reaction causing immediate rash, facial/tongue/throat swelling, SOB or lightheadedness with  hypotension: Yes Has patient had a PCN reaction causing severe rash involving mucus membranes or skin necrosis: No Has patient had a PCN reaction that required hospitalization: No Has patient had a PCN reaction occurring within the last 10 years: No If all of the above answers are "NO", then may proceed with Cephalosporin use.     Prior to Admission medications   Medication Sig Start Date End Date Taking? Authorizing Provider  albuterol (PROVENTIL) (2.5 MG/3ML) 0.083% nebulizer solution Take 3 mLs (2.5 mg total) by nebulization every 6 (six) hours as needed for wheezing or shortness of breath. 07/30/18  Yes Fritzi Mandes, MD  albuterol (VENTOLIN HFA) 108 (90 Base) MCG/ACT inhaler Inhale 1-2 puffs into the lungs every 4 (four) hours as needed for wheezing or shortness of breath.   Yes [provider]  apixaban (ELIQUIS) 5 MG TABS tablet Take 5 mg by mouth 2 (two) times daily. 06/11/16  Yes [provider]  aspirin 81 MG chewable tablet Chew 81 mg by mouth daily.   Yes [provider]  atorvastatin (LIPITOR) 40 MG tablet Take 40 mg by mouth at bedtime.    Yes [provider]  Fluticasone-Salmeterol (ADVAIR) 250-50 MCG/DOSE AEPB Inhale 1 puff into the lungs 2 (two) times daily.   Yes [provider]  furosemide (LASIX) 80 MG tablet Take 1 tablet (80 mg total) by mouth daily. Patient taking differently: Take 80 mg by mouth daily as needed.  05/10/16  Yes Hackney, Otila Kluver A, FNP  insulin detemir (LEVEMIR) 100 UNIT/ML injection Inject  40-50 Units into the skin at bedtime.    Yes [provider]  insulin lispro (HUMALOG) 100 UNIT/ML injection Inject 12-15 Units into the skin 3 (three) times daily with meals. Sliding scale as needed   Yes [provider]  KLOR-CON M20 20 MEQ tablet TAKE ONE TABLET BY MOUTH TWICE DAILY Patient taking differently: Take 20 mEq by mouth 2 (two) times daily.  08/20/16  Yes Hackney, Tina A, FNP  lisinopril (ZESTRIL) 30 MG  tablet Take 30 mg by mouth at bedtime.    Yes [provider]  Melatonin 5 MG TABS Take 5 mg by mouth at bedtime as needed for sleep.   Yes [provider]  methocarbamol (ROBAXIN) 500 MG tablet 1-2 tablets qid prn muscle spasms 07/22/16  Yes Letitia Neri L, PA-C  metoprolol tartrate (LOPRESSOR) 100 MG tablet Take 1 tablet (100 mg total) by mouth 2 (two) times daily. 07/30/18  Yes Fritzi Mandes, MD  theophylline (THEODUR) 300 MG 12 hr tablet Take 300 mg by mouth 2 (two) times daily.    Yes [provider]  tiotropium (SPIRIVA) 18 MCG inhalation capsule Place 18 mcg into inhaler and inhale daily.   Yes [provider]     Results for orders placed or performed during the hospital encounter of 11/05/18 (from the past 48 hour(s))  CBC with Differential     Status: Abnormal   Collection Time: 11/05/18 11:17 PM  Result Value Ref Range   WBC 6.6 4.0 - 10.5 K/uL   RBC 5.48 4.22 - 5.81 MIL/uL   Hemoglobin 15.1 13.0 - 17.0 g/dL   HCT 48.1 39.0 - 52.0 %   MCV 87.8 80.0 - 100.0 fL   MCH 27.6 26.0 - 34.0 pg   MCHC 31.4 30.0 - 36.0 g/dL   RDW 12.6 11.5 - 15.5 %   Platelets 202 150 - 400 K/uL   nRBC 0.0 0.0 - 0.2 %   Neutrophils Relative % 62 %   Neutro Abs 4.1 1.7 - 7.7 K/uL   Lymphocytes Relative 19 %   Lymphs Abs 1.3 0.7 - 4.0 K/uL   Monocytes Relative 17 %   Monocytes Absolute 1.1 (H) 0.1 - 1.0 K/uL   Eosinophils Relative 1 %   Eosinophils Absolute 0.1 0.0 - 0.5 K/uL   Basophils Relative 1 %   Basophils Absolute 0.0 0.0 - 0.1 K/uL   Immature Granulocytes 0 %   Abs Immature Granulocytes 0.02 0.00 - 0.07 K/uL    Comment: Performed at [facility], [address] [place], [place] [address]  Comprehensive metabolic panel     Status: Abnormal   Collection Time: 11/05/18 11:17 PM  Result Value Ref Range   Sodium 142 135 - 145 mmol/L   Potassium 4.1 3.5 - 5.1 mmol/L   Chloride 97 (L) 98 - 111 mmol/L   CO2 38 (H) 22 - 32 mmol/L   Glucose, Bld 112  (H) 70 - 99 mg/dL   BUN 18 6 - 20 mg/dL   Creatinine, Ser 1.53 (H) 0.61 - 1.24 mg/dL   Calcium 8.8 (L) 8.9 - 10.3 mg/dL   Total Protein 7.1 6.5 - 8.1 g/dL   Albumin 3.7 3.5 - 5.0 g/dL   AST 22 15 - 41 U/L   ALT 20 0 - 44 U/L   Alkaline Phosphatase 77 38 - 126 U/L   Total Bilirubin 0.6 0.3 - 1.2 mg/dL   GFR calc non Af Amer 53 (L) >60 mL/min   GFR calc  Af Amer >60 >60 mL/min   Anion gap 7 5 - 15    Comment: Performed at [facility], [place], [place] [address]  Troponin I (High Sensitivity)     Status: Abnormal   Collection Time: 11/05/18 11:17 PM  Result Value Ref Range   Troponin I (High Sensitivity) 48 (H) <18 ng/L    Comment: (NOTE) Elevated high sensitivity troponin I (hsTnI) values and significant  changes across serial measurements may suggest ACS but many other  chronic and acute conditions are known to elevate hsTnI results.  Refer to the "Links" section for chest pain algorithms and additional  guidance. Performed at [facility], [address] [place], [place] [address]   Brain natriuretic peptide     Status: None   Collection Time: 11/05/18 11:17 PM  Result Value Ref Range   B Natriuretic Peptide 77.0 0.0 - 100.0 pg/mL    Comment: Performed at [facility], [place], [place] [address]  Troponin I (High Sensitivity)     Status: Abnormal   Collection Time: 11/06/18  1:54 AM  Result Value Ref Range   Troponin I (High Sensitivity) 55 (H) <18 ng/L    Comment: (NOTE) Elevated high sensitivity troponin I (hsTnI) values and significant  changes across serial measurements may suggest ACS but many other  chronic and acute conditions are known to elevate hsTnI results.  Refer to the "Links" section for chest pain algorithms and additional  guidance. Performed at [facility], [address], [place], [place] [address]    Dg Chest Portable 1 View  Result Date: 11/05/2018 CLINICAL DATA:  Mid chest  pain. Shortness of breath. EXAM: PORTABLE CHEST 1 VIEW COMPARISON:  Radiograph 07/28/2018. CT 02/03/2018 FINDINGS: Chronic elevation of left hemidiaphragm. Multifocal lung scarring, stable from prior exam. Unchanged heart size and mediastinal contours. No acute airspace disease, pulmonary edema, pleural effusion or pneumothorax. No acute osseous abnormalities. IMPRESSION: 1. No acute abnormality or change from prior exam. 2. Chronic elevation of left hemidiaphragm and multifocal lung scarring. Electronically Signed   By: Keith Rake M.D.   On: 11/05/2018 22:22    Review of Systems  Constitutional: Negative for chills and fever.  HENT: Negative for sore throat and tinnitus.   Eyes: Negative for blurred vision and redness.  Respiratory: Negative for cough and shortness of breath.   Cardiovascular: Positive for chest pain. Negative for palpitations, orthopnea and PND.  Gastrointestinal: Negative for abdominal pain, diarrhea, nausea and vomiting.  Genitourinary: Negative for dysuria, frequency and urgency.  Musculoskeletal: Negative for joint pain and myalgias.  Skin: Negative for rash.       No lesions  Neurological: Negative for speech change, focal weakness and weakness.  Endo/Heme/Allergies: Does not bruise/bleed easily.       No temperature intolerance  Psychiatric/Behavioral: Negative for depression and suicidal ideas.    Blood pressure (!) 156/100, pulse 90, temperature 97.7 F (36.5 C), temperature source Oral, resp. rate (!) 24, height 6' (1.829 m), weight 122 kg, SpO2 97 %. Physical Exam  Vitals reviewed. Constitutional: He is oriented to person, place, and time. He appears well-developed and well-nourished. No distress.  HENT:  Head: Normocephalic and atraumatic.  Mouth/Throat: Oropharynx is clear and moist.  Eyes: Pupils are equal, round, and reactive to light. Conjunctivae and EOM are normal. No scleral icterus.  Neck: Normal range of motion. Neck supple. No JVD present. No  tracheal deviation present. No thyromegaly present.  Cardiovascular: Normal rate  and regular rhythm. Exam reveals no gallop and no friction rub.  No murmur heard. Respiratory: Effort normal and breath sounds normal. No respiratory distress.  GI: Soft. Bowel sounds are normal. He exhibits no distension. There is no abdominal tenderness.  Genitourinary:    Genitourinary Comments: Deferred   Musculoskeletal: Normal range of motion.        General: No edema.  Lymphadenopathy:    He has no cervical adenopathy.  Neurological: He is alert and oriented to person, place, and time. No cranial nerve deficit.  Skin: Skin is warm and dry. No rash noted.  Psychiatric: He has a normal mood and affect. His behavior is normal. Judgment and thought content normal.     Assessment/Plan This is a 48 year old male admitted for chest pain. 1.  Chest pain: Associated elevated troponin but no EKG changes indicating ischemia.  Nonetheless, continue to follow cardiac biomarkers.  Monitor telemetry and continue aspirin.  Consult cardiology. 2.  Hypertension: Uncontrolled; continue lisinopril and metoprolol.  Labetalol as needed. 3.  Diabetes mellitus type 2: Continue basal insulin therapy as well as sliding scale insulin.  Hold oral hypoglycemic agents. 4.  Hyperlipidemia: Continue statin therapy. 5.  CHF: Chronic; diastolic.  BNP is 77.  Continue Lasix as needed. 6.  COPD: Stable; continue Spiriva as well as inhaled corticosteroid with long-acting bronchial agonist.  Also continue theophylline.  Albuterol as needed. 7.  DVT prophylaxis: Eliquis 8.  GI prophylaxis: None The patient is a full code.  Time spent on admission orders and patient care approximately 45 minutes  Harrie Foreman, MD 11/06/2018, 3:33 AM

## 2018-11-06 NOTE — Consult Note (Signed)
Reason for Consult: Dyspnea chest pain Referring Physician: Dr. Rosilyn Mings hospitalist Cardiologist Dr. Gerrit Friends is an 48 y.o. male.  HPI: Presents with chest pain shortness of breath he has history of diastolic congestive heart failure COPD coronary disease hypertension sarcoidosis diabetes hyperlipidemia.  He is complaining of recurrent chest pain symptoms recently.  Describes the centralized chest pain began while he was washing dishes.  Patient admits to associated shortness of breath but denies any nausea vomiting or diarrhea diarrhea no diaphoresis no chills.  Patient had sharp pains in the chest but not necessarily related to activity except for worsening disease chest discomfort is somewhat improved since being in the emergency room he has had multiple nebulizer treatments which seemed to improve his symptoms his BNP was low he had borderline troponins but then was subsequently admitted to telemetry  Past Medical History:  Diagnosis Date  . CHF (congestive heart failure) ([place])   . COPD (chronic obstructive pulmonary disease) ([place])   . Coronary artery disease   . Diabetes mellitus without complication ([place])   . Hypercholesteremia unk  . Hypertension   . Myocardial infarction ([place])   . Sarcoidosis   . Sleep apnea    does not use CPAP regularly.  . Stroke [facility])     Past Surgical History:  Procedure Laterality Date  . APPENDECTOMY    . PARTIAL NEPHRECTOMY      Family History  Problem Relation Age of Onset  . Hypertension Mother   . Heart disease Mother   . Diabetes Mother   . Cancer Father     Social History:  reports that he has been smoking cigarettes. He has a 6.25 pack-year smoking history. He has never used smokeless tobacco. He reports current alcohol use. He reports current drug use. Drug: Marijuana.  Allergies:  Allergies  Allergen Reactions  . Penicillins Anaphylaxis and Other (See Comments)    Has patient had a PCN reaction causing  immediate rash, facial/tongue/throat swelling, SOB or lightheadedness with hypotension: Yes Has patient had a PCN reaction causing severe rash involving mucus membranes or skin necrosis: No Has patient had a PCN reaction that required hospitalization: No Has patient had a PCN reaction occurring within the last 10 years: No If all of the above answers are "NO", then may proceed with Cephalosporin use.     Medications: I have reviewed the patient's current medications.  Results for orders placed or performed during the hospital encounter of 11/05/18 (from the past 48 hour(s))  CBC with Differential     Status: Abnormal   Collection Time: 11/05/18 11:17 PM  Result Value Ref Range   WBC 6.6 4.0 - 10.5 K/uL   RBC 5.48 4.22 - 5.81 MIL/uL   Hemoglobin 15.1 13.0 - 17.0 g/dL   HCT 48.1 39.0 - 52.0 %   MCV 87.8 80.0 - 100.0 fL   MCH 27.6 26.0 - 34.0 pg   MCHC 31.4 30.0 - 36.0 g/dL   RDW 12.6 11.5 - 15.5 %   Platelets 202 150 - 400 K/uL   nRBC 0.0 0.0 - 0.2 %   Neutrophils Relative % 62 %   Neutro Abs 4.1 1.7 - 7.7 K/uL   Lymphocytes Relative 19 %   Lymphs Abs 1.3 0.7 - 4.0 K/uL   Monocytes Relative 17 %   Monocytes Absolute 1.1 (H) 0.1 - 1.0 K/uL   Eosinophils Relative 1 %   Eosinophils Absolute 0.1 0.0 - 0.5 K/uL   Basophils Relative 1 %  Basophils Absolute 0.0 0.0 - 0.1 K/uL   Immature Granulocytes 0 %   Abs Immature Granulocytes 0.02 0.00 - 0.07 K/uL    Comment: Performed at [facility], [address] [place], [place] [address]  Comprehensive metabolic panel     Status: Abnormal   Collection Time: 11/05/18 11:17 PM  Result Value Ref Range   Sodium 142 135 - 145 mmol/L   Potassium 4.1 3.5 - 5.1 mmol/L   Chloride 97 (L) 98 - 111 mmol/L   CO2 38 (H) 22 - 32 mmol/L   Glucose, Bld 112 (H) 70 - 99 mg/dL   BUN 18 6 - 20 mg/dL   Creatinine, Ser 1.53 (H) 0.61 - 1.24 mg/dL   Calcium 8.8 (L) 8.9 - 10.3 mg/dL   Total Protein 7.1 6.5 - 8.1 g/dL   Albumin 3.7 3.5 - 5.0 g/dL    AST 22 15 - 41 U/L   ALT 20 0 - 44 U/L   Alkaline Phosphatase 77 38 - 126 U/L   Total Bilirubin 0.6 0.3 - 1.2 mg/dL   GFR calc non Af Amer 53 (L) >60 mL/min   GFR calc Af Amer >60 >60 mL/min   Anion gap 7 5 - 15    Comment: Performed at [facility], [place], [place] [address]  Troponin I (High Sensitivity)     Status: Abnormal   Collection Time: 11/05/18 11:17 PM  Result Value Ref Range   Troponin I (High Sensitivity) 48 (H) <18 ng/L    Comment: (NOTE) Elevated high sensitivity troponin I (hsTnI) values and significant  changes across serial measurements may suggest ACS but many other  chronic and acute conditions are known to elevate hsTnI results.  Refer to the "Links" section for chest pain algorithms and additional  guidance. Performed at [facility], [address] [place], [place] [address]   Brain natriuretic peptide     Status: None   Collection Time: 11/05/18 11:17 PM  Result Value Ref Range   B Natriuretic Peptide 77.0 0.0 - 100.0 pg/mL    Comment: Performed at [facility], [place], [place] [address]  Troponin I (High Sensitivity)     Status: Abnormal   Collection Time: 11/06/18  1:54 AM  Result Value Ref Range   Troponin I (High Sensitivity) 55 (H) <18 ng/L    Comment: (NOTE) Elevated high sensitivity troponin I (hsTnI) values and significant  changes across serial measurements may suggest ACS but many other  chronic and acute conditions are known to elevate hsTnI results.  Refer to the "Links" section for chest pain algorithms and additional  guidance. Performed at [facility], [address] [place], [place] [address]   TSH     Status: Abnormal   Collection Time: 11/06/18 10:13 AM  Result Value Ref Range   TSH 0.311 (L) 0.350 - 4.500 uIU/mL    Comment: Performed by a 3rd Generation assay with a functional sensitivity of <=0.01 uIU/mL. Performed at [facility], [place], [place] [address]   Troponin I (High Sensitivity)     Status: Abnormal   Collection Time: 11/06/18 10:13 AM  Result Value Ref Range   Troponin I (High Sensitivity) 34 (H) <18 ng/L    Comment: (NOTE) Elevated high sensitivity troponin I (hsTnI) values and significant  changes across serial measurements may suggest ACS but many other  chronic and acute conditions are known to elevate hsTnI results.  Refer  to the "Links" section for chest pain algorithms and additional  guidance. Performed at [facility], [place], [place] [address]   Troponin I (High Sensitivity)     Status: Abnormal   Collection Time: 11/06/18 11:35 AM  Result Value Ref Range   Troponin I (High Sensitivity) 29 (H) <18 ng/L    Comment: (NOTE) Elevated high sensitivity troponin I (hsTnI) values and significant  changes across serial measurements may suggest ACS but many other  chronic and acute conditions are known to elevate hsTnI results.  Refer to the "Links" section for chest pain algorithms and additional  guidance. Performed at [facility], [address] [place], [place] [address]   Glucose, capillary     Status: Abnormal   Collection Time: 11/06/18 11:59 AM  Result Value Ref Range   Glucose-Capillary 176 (H) 70 - 99 mg/dL   Comment 1 Notify RN    Comment 2 Document in Chart     Dg Chest Portable 1 View  Result Date: 11/05/2018 CLINICAL DATA:  Mid chest pain. Shortness of breath. EXAM: PORTABLE CHEST 1 VIEW COMPARISON:  Radiograph 07/28/2018. CT 02/03/2018 FINDINGS: Chronic elevation of left hemidiaphragm. Multifocal lung scarring, stable from prior exam. Unchanged heart size and mediastinal contours. No acute airspace disease, pulmonary edema, pleural effusion or pneumothorax. No acute osseous abnormalities. IMPRESSION: 1. No acute abnormality or change from prior exam. 2. Chronic elevation of left hemidiaphragm and multifocal lung scarring.  Electronically Signed   By: Keith Rake M.D.   On: 11/05/2018 22:22    Review of Systems  Constitutional: Positive for diaphoresis and malaise/fatigue.  HENT: Positive for congestion.   Eyes: Negative.   Respiratory: Positive for shortness of breath.   Cardiovascular: Positive for chest pain, orthopnea, leg swelling and PND.  Gastrointestinal: Negative.   Genitourinary: Negative.   Musculoskeletal: Negative.   Skin: Negative.   Neurological: Positive for dizziness and weakness.  Endo/Heme/Allergies: Negative.   Psychiatric/Behavioral: Negative.    Blood pressure (!) 155/97, pulse 83, temperature 98 F (36.7 C), temperature source Oral, resp. rate 19, height 6' (1.829 m), weight 122 kg, SpO2 97 %. Physical Exam  Nursing note and vitals reviewed. Constitutional: He is oriented to person, place, and time. He appears well-developed and well-nourished.  HENT:  Head: Normocephalic and atraumatic.  Eyes: Pupils are equal, round, and reactive to light. Conjunctivae are normal.  Neck: Normal range of motion. Neck supple.  Cardiovascular: Normal rate, regular rhythm and normal heart sounds.  Respiratory: Effort normal and breath sounds normal.  GI: Soft. Bowel sounds are normal.  Musculoskeletal: Normal range of motion.  Neurological: He is alert and oriented to person, place, and time. He has normal reflexes.  Skin: Skin is warm and dry.  Psychiatric: He has a normal mood and affect.    Assessment/Plan: Shortness of breath Dyspnea Congestive heart failure Coronary disease Obesity Smoking Sarcoidosis Obstructive sleep apnea CVA Diabetes . Plan Recommend evaluation for chest pain consider functional study Rule out myocardial infarction follow-up EKGs troponins Continue aspirin therapy Hypertension difficult to control at this point on lisinopril metoprolol and labetalol as needed Diabetes type 2 continue sliding-scale insulin continue to hold hypoglycemic agents for  now Continue statin therapy for lipid management Congestive heart failure chronic  diastolic heart failure compensated COPD mild to moderate continue inhalers consider evaluation by pulmonary No clear evidence of worsening heart failure with relatively benign BNP Continue GERD prophylaxis therapy Recommend conservative cardiac input at this point   D   11/06/2018, 1:26 PM

## 2018-11-06 NOTE — Progress Notes (Signed)
[facility] [place] NAME: [name]    MR#:  [id number]  DATE OF BIRTH:  February 14, 1970  SUBJECTIVE:  CHIEF COMPLAINT:   Chief Complaint  Patient presents with  . Chest Pain  Patient seen and evaluated today Has chest discomfort and shortness of breath Currently on oxygen via nasal cannula 2 L Uses oxygen at home  REVIEW OF SYSTEMS:    ROS  CONSTITUTIONAL: No documented fever. No fatigue, weakness. No weight gain, no weight loss.  EYES: No blurry or double vision.  ENT: No tinnitus. No postnasal drip. No redness of the oropharynx.  RESPIRATORY: No cough, no wheeze, no hemoptysis. No dyspnea.  CARDIOVASCULAR: Has chest pain. Has orthopnea. No palpitations. No syncope.  GASTROINTESTINAL: No nausea, no vomiting or diarrhea. No abdominal pain. No melena or hematochezia.  GENITOURINARY: No dysuria or hematuria.  ENDOCRINE: No polyuria or nocturia. No heat or cold intolerance.  HEMATOLOGY: No anemia. No bruising. No bleeding.  INTEGUMENTARY: No rashes. No lesions.  MUSCULOSKELETAL: No arthritis. No swelling. No gout.  NEUROLOGIC: No numbness, tingling, or ataxia. No seizure-type activity.  PSYCHIATRIC: No anxiety. No insomnia. No ADD.   DRUG ALLERGIES:   Allergies  Allergen Reactions  . Penicillins Anaphylaxis and Other (See Comments)    Has patient had a PCN reaction causing immediate rash, facial/tongue/throat swelling, SOB or lightheadedness with hypotension: Yes Has patient had a PCN reaction causing severe rash involving mucus membranes or skin necrosis: No Has patient had a PCN reaction that required hospitalization: No Has patient had a PCN reaction occurring within the last 10 years: No If all of the above answers are "NO", then may proceed with Cephalosporin use.     VITALS:  Blood pressure (!) 144/90, pulse 91, temperature 97.7 F (36.5 C), temperature source Oral, resp. rate (!) 24, height 6' (1.829 m), weight 122 kg,  SpO2 93 %.  PHYSICAL EXAMINATION:   Physical Exam  GENERAL:  48 y.o.-year-old patient lying in the bed with no acute distress.  EYES: Pupils equal, round, reactive to light and accommodation. No scleral icterus. Extraocular muscles intact.  HEENT: Head atraumatic, normocephalic. Oropharynx and nasopharynx clear.  NECK:  Supple, no jugular venous distention. No thyroid enlargement, no tenderness.  LUNGS: Decreased breath sounds bilaterally, bibasilar Rales heard. No use of accessory muscles of respiration.  CARDIOVASCULAR: S1, S2 normal. No murmurs, rubs, or gallops.  ABDOMEN: Soft, nontender, nondistended. Bowel sounds present. No organomegaly or mass.  EXTREMITIES: No cyanosis, clubbing  has edema b/l.    NEUROLOGIC: Cranial nerves II through XII are intact. No focal Motor or sensory deficits b/l.   PSYCHIATRIC: The patient is alert and oriented x 3.  SKIN: No obvious rash, lesion, or ulcer.   LABORATORY PANEL:   CBC Recent Labs  Lab 11/05/18 2317  WBC 6.6  HGB 15.1  HCT 48.1  PLT 202   ------------------------------------------------------------------------------------------------------------------ Chemistries  Recent Labs  Lab 11/05/18 2317  NA 142  K 4.1  CL 97*  CO2 38*  GLUCOSE 112*  BUN 18  CREATININE 1.53*  CALCIUM 8.8*  AST 22  ALT 20  ALKPHOS 77  BILITOT 0.6   ------------------------------------------------------------------------------------------------------------------  Cardiac Enzymes No results for input(s): TROPONINI in the last 168 hours. ------------------------------------------------------------------------------------------------------------------  RADIOLOGY:  Dg Chest Portable 1 View  Result Date: 11/05/2018 CLINICAL DATA:  Mid chest pain. Shortness of breath. EXAM: PORTABLE CHEST 1 VIEW COMPARISON:  Radiograph 07/28/2018. CT 02/03/2018 FINDINGS: Chronic elevation of left hemidiaphragm. Multifocal lung  scarring, stable from prior exam.  Unchanged heart size and mediastinal contours. No acute airspace disease, pulmonary edema, pleural effusion or pneumothorax. No acute osseous abnormalities. IMPRESSION: 1. No acute abnormality or change from prior exam. 2. Chronic elevation of left hemidiaphragm and multifocal lung scarring. Electronically Signed   By: Keith Rake M.D.   On: 11/05/2018 22:22     ASSESSMENT AND PLAN:   48 year old male patient with history of chronic congestive heart failure, COPD on home oxygen, coronary disease, sarcoidosis, hypertension, diabetes mellitus type 2, hyperlipidemia currently under hospitalist service  -Chest pain Cycle troponin Cardiology consult Monitor patient on telemetry Check on echocardiogram  -Chronic congestive heart failure diastolic Continue Lasix for diuresis Not in fluid overload currently BNP 77  -COPD Continue oxygen via nasal cannula as well as home dose inhalers and inhaled steroids  -Type 2 diabetes mellitus Diabetic diet with sliding scale coverage with insulin  -DVT prophylaxis Continue Eliquis for anticoagulation  -Hypertension uncontrolled Resume ACE inhibitor and beta-blocker PRN labetalol for control of blood pressure  -Tobacco abuse Tobacco cessation counseled to the patient for 6 minutes Patient refused nicotine patch  All the records are reviewed and case discussed with Care Management/Social Worker. Management plans discussed with the patient, family and they are in agreement.  CODE STATUS: Full code  DVT Prophylaxis: SCDs  TOTAL TIME TAKING CARE OF THIS PATIENT: 45 minutes.   POSSIBLE D/C IN 2 to 3 DAYS, DEPENDING ON CLINICAL CONDITION.  Saundra Shelling M.D on 11/06/2018 at 9:37 AM  Between 7am to 6pm - Pager - [phone]  After 6pm go to [email or web address] - password [email or web address] [facility]  Office  [phone]  CC: Primary care physician; [place]  Note: This dictation was prepared with Dragon  dictation along with smaller Company secretary. Any transcriptional errors that result from this process are unintentional.

## 2018-11-06 NOTE — Progress Notes (Signed)
Pt ask for prn duoneb. No distress noted, lungs clear/diminished to ascultation. I will continue to assess.

## 2018-11-06 NOTE — ED Notes (Signed)
Admitting Dr at bedside

## 2018-11-06 NOTE — Progress Notes (Signed)
Advanced care plan. Purpose of the Encounter: CODE STATUS Parties in Attendance: Patient Patient's Decision Capacity: Good Subjective/Patient's story: The patient with past medical history of CHF, COPD, CAD, hypertension, sarcoidosis, diabetes and hyperlipidemia presents to the emergency department complaining of chest pain.  The patient reports centralized chest pain that began while he was washing dishes.  He admits to associated shortness of breath but denies nausea, vomiting or diaphoresis.  He has had sharp pains in his chest before but not while doing an activity with as as little exertion as washing dishes.  His chest discomfort was improved but not completely resolved by the time of arrival in the emergency department.  The patient was given multiple breathing treatments which improved his shortness of breath.  Laboratory evaluation revealed elevated troponin.  Once patient was stabilized emergency department staff called the hospitalist service for admission. Objective/Medical story Patient needs serial troponin cardiology consultation Further ischemic work-up if recommended by cardiology Needs Lasix for diuresis Goals of care determination:  Advance care directives goals of care and treatment plan discussed Patient is full resuscitation for now which includes CPR, intubation ventilator if the need arises CODE STATUS: Full code Time spent discussing advanced care planning: 16 minutes

## 2018-11-06 NOTE — ED Notes (Signed)
Pt given urinal.

## 2018-11-06 NOTE — ED Provider Notes (Signed)
-----------------------------------------   3:22 AM on 11/06/2018 -----------------------------------------   Blood pressure (!) 156/100, pulse 90, temperature 97.7 F (36.5 C), temperature source Oral, resp. rate (!) 24, height 6' (1.829 m), weight 122 kg, SpO2 97 %.  Assuming care from Dr. Charna Archer of [name] is a 48 y.o. male with a chief complaint of Chest Pain .    Please refer to H&P by previous MD for further details.  The current plan of care is to f/u repeat troponin and if going up patient to be admitted.   _________________________ 3:23 AM on 11/06/2018 -----------------------------------------  Delta troponin of 7. Patient no longer having CP. Will admit to [name], [place], MD 11/06/18 [phone]

## 2018-11-07 DIAGNOSIS — Z20828 Contact with and (suspected) exposure to other viral communicable diseases: Secondary | ICD-10-CM | POA: Diagnosis present

## 2018-11-07 DIAGNOSIS — Z6837 Body mass index (BMI) 37.0-37.9, adult: Secondary | ICD-10-CM | POA: Diagnosis not present

## 2018-11-07 DIAGNOSIS — Z794 Long term (current) use of insulin: Secondary | ICD-10-CM | POA: Diagnosis not present

## 2018-11-07 DIAGNOSIS — G4733 Obstructive sleep apnea (adult) (pediatric): Secondary | ICD-10-CM | POA: Diagnosis present

## 2018-11-07 DIAGNOSIS — Z9981 Dependence on supplemental oxygen: Secondary | ICD-10-CM | POA: Diagnosis not present

## 2018-11-07 DIAGNOSIS — F17210 Nicotine dependence, cigarettes, uncomplicated: Secondary | ICD-10-CM | POA: Diagnosis present

## 2018-11-07 DIAGNOSIS — E669 Obesity, unspecified: Secondary | ICD-10-CM | POA: Diagnosis present

## 2018-11-07 DIAGNOSIS — I252 Old myocardial infarction: Secondary | ICD-10-CM | POA: Diagnosis not present

## 2018-11-07 DIAGNOSIS — E785 Hyperlipidemia, unspecified: Secondary | ICD-10-CM | POA: Diagnosis present

## 2018-11-07 DIAGNOSIS — E119 Type 2 diabetes mellitus without complications: Secondary | ICD-10-CM | POA: Diagnosis present

## 2018-11-07 DIAGNOSIS — Z833 Family history of diabetes mellitus: Secondary | ICD-10-CM | POA: Diagnosis not present

## 2018-11-07 DIAGNOSIS — R0789 Other chest pain: Secondary | ICD-10-CM | POA: Diagnosis present

## 2018-11-07 DIAGNOSIS — J449 Chronic obstructive pulmonary disease, unspecified: Secondary | ICD-10-CM | POA: Diagnosis present

## 2018-11-07 DIAGNOSIS — Z7982 Long term (current) use of aspirin: Secondary | ICD-10-CM | POA: Diagnosis not present

## 2018-11-07 DIAGNOSIS — Z8249 Family history of ischemic heart disease and other diseases of the circulatory system: Secondary | ICD-10-CM | POA: Diagnosis not present

## 2018-11-07 DIAGNOSIS — I509 Heart failure, unspecified: Secondary | ICD-10-CM

## 2018-11-07 DIAGNOSIS — Z7901 Long term (current) use of anticoagulants: Secondary | ICD-10-CM | POA: Diagnosis not present

## 2018-11-07 DIAGNOSIS — Z905 Acquired absence of kidney: Secondary | ICD-10-CM | POA: Diagnosis not present

## 2018-11-07 DIAGNOSIS — Z79899 Other long term (current) drug therapy: Secondary | ICD-10-CM | POA: Diagnosis not present

## 2018-11-07 DIAGNOSIS — Z7951 Long term (current) use of inhaled steroids: Secondary | ICD-10-CM | POA: Diagnosis not present

## 2018-11-07 DIAGNOSIS — I110 Hypertensive heart disease with heart failure: Secondary | ICD-10-CM | POA: Diagnosis present

## 2018-11-07 DIAGNOSIS — I5033 Acute on chronic diastolic (congestive) heart failure: Secondary | ICD-10-CM | POA: Diagnosis present

## 2018-11-07 DIAGNOSIS — I2510 Atherosclerotic heart disease of native coronary artery without angina pectoris: Secondary | ICD-10-CM | POA: Diagnosis present

## 2018-11-07 DIAGNOSIS — D869 Sarcoidosis, unspecified: Secondary | ICD-10-CM | POA: Diagnosis present

## 2018-11-07 DIAGNOSIS — Z8673 Personal history of transient ischemic attack (TIA), and cerebral infarction without residual deficits: Secondary | ICD-10-CM | POA: Diagnosis not present

## 2018-11-07 LAB — BASIC METABOLIC PANEL
Anion gap: 8 (ref 5–15)
BUN: 18 mg/dL (ref 6–20)
CO2: 37 mmol/L — ABNORMAL HIGH (ref 22–32)
Calcium: 8.6 mg/dL — ABNORMAL LOW (ref 8.9–10.3)
Chloride: 97 mmol/L — ABNORMAL LOW (ref 98–111)
Creatinine, Ser: 1.43 mg/dL — ABNORMAL HIGH (ref 0.61–1.24)
GFR calc Af Amer: >60 mL/min (ref >60)
GFR calc non Af Amer: 57 mL/min — ABNORMAL LOW (ref >60)
Glucose, Bld: 171 mg/dL — ABNORMAL HIGH (ref 70–99)
Potassium: 4.4 mmol/L (ref 3.5–5.1)
Sodium: 142 mmol/L (ref 135–145)

## 2018-11-07 LAB — GLUCOSE, CAPILLARY
Glucose-Capillary: 154 mg/dL — ABNORMAL HIGH (ref 70–99)
Glucose-Capillary: 156 mg/dL — ABNORMAL HIGH (ref 70–99)
Glucose-Capillary: 176 mg/dL — ABNORMAL HIGH (ref 70–99)
Glucose-Capillary: 165 mg/dL — ABNORMAL HIGH (ref 70–99)

## 2018-11-07 MED ORDER — ALBUTEROL SULFATE (2.5 MG/3ML) 0.083% IN NEBU
2.5000 mg | INHALATION_SOLUTION | RESPIRATORY_TRACT | Status: DC | PRN
  Administered 2018-11-07 – 2018-11-08 (×4): 2.5 mg via RESPIRATORY_TRACT
  Filled 2018-11-07 (×5): qty 3

## 2018-11-07 MED ORDER — ENSURE MAX PROTEIN PO LIQD
11.0000 [oz_av] | Freq: Two times a day (BID) | ORAL | Status: DC
  Administered 2018-11-07 (×2): 11 [oz_av] via ORAL
  Filled 2018-11-07: qty 330

## 2018-11-07 NOTE — Progress Notes (Addendum)
[facility] [place] NAME: [name]    MR#:  [id number]  DATE OF BIRTH:  [birth year]  SUBJECTIVE:  CHIEF COMPLAINT:   Chief Complaint  Patient presents with  . Chest Pain  Patient seen and evaluated today Still has shortness of breath Swelling in legs Currently on oxygen via nasal cannula 2 L Uses oxygen at home  REVIEW OF SYSTEMS:    ROS  CONSTITUTIONAL: No documented fever. No fatigue, weakness. No weight gain, no weight loss.  EYES: No blurry or double vision.  ENT: No tinnitus. No postnasal drip. No redness of the oropharynx.  RESPIRATORY: No cough, no wheeze, no hemoptysis. No dyspnea.  CARDIOVASCULAR: Has no chest pain. Has orthopnea. No palpitations. No syncope.  GASTROINTESTINAL: No nausea, no vomiting or diarrhea. No abdominal pain. No melena or hematochezia.  GENITOURINARY: No dysuria or hematuria.  ENDOCRINE: No polyuria or nocturia. No heat or cold intolerance.  HEMATOLOGY: No anemia. No bruising. No bleeding.  INTEGUMENTARY: No rashes. No lesions.  MUSCULOSKELETAL: No arthritis. Has swelling. No gout.  NEUROLOGIC: No numbness, tingling, or ataxia. No seizure-type activity.  PSYCHIATRIC: No anxiety. No insomnia. No ADD.   DRUG ALLERGIES:   Allergies  Allergen Reactions  . Penicillins Anaphylaxis and Other (See Comments)    Has patient had a PCN reaction causing immediate rash, facial/tongue/throat swelling, SOB or lightheadedness with hypotension: Yes Has patient had a PCN reaction causing severe rash involving mucus membranes or skin necrosis: No Has patient had a PCN reaction that required hospitalization: No Has patient had a PCN reaction occurring within the last 10 years: No If all of the above answers are "NO", then may proceed with Cephalosporin use.     VITALS:  Blood pressure 133/82, pulse 79, temperature 98.6 F (37 C), temperature source Oral, resp. rate 18, height 6' (1.829 m), weight 126.3 kg,  SpO2 91 %.  PHYSICAL EXAMINATION:   Physical Exam  GENERAL:  48 y.o.-year-old patient lying in the bed with no acute distress.  EYES: Pupils equal, round, reactive to light and accommodation. No scleral icterus. Extraocular muscles intact.  HEENT: Head atraumatic, normocephalic. Oropharynx and nasopharynx clear.  NECK:  Supple, no jugular venous distention. No thyroid enlargement, no tenderness.  LUNGS: Decreased breath sounds bilaterally, bibasilar Rales heard. No use of accessory muscles of respiration.  CARDIOVASCULAR: S1, S2 normal. No murmurs, rubs, or gallops.  ABDOMEN: Soft, nontender, nondistended. Bowel sounds present. No organomegaly or mass.  EXTREMITIES: No cyanosis, clubbing  has edema b/l.    NEUROLOGIC: Cranial nerves II through XII are intact. No focal Motor or sensory deficits b/l.   PSYCHIATRIC: The patient is alert and oriented x 3.  SKIN: No obvious rash, lesion, or ulcer.   LABORATORY PANEL:   CBC Recent Labs  Lab 11/05/18 2317  WBC 6.6  HGB 15.1  HCT 48.1  PLT 202   ------------------------------------------------------------------------------------------------------------------ Chemistries  Recent Labs  Lab 11/05/18 2317 11/07/18 0541  NA 142 142  K 4.1 4.4  CL 97* 97*  CO2 38* 37*  GLUCOSE 112* 171*  BUN 18 18  CREATININE 1.53* 1.43*  CALCIUM 8.8* 8.6*  AST 22  --   ALT 20  --   ALKPHOS 77  --   BILITOT 0.6  --    ------------------------------------------------------------------------------------------------------------------  Cardiac Enzymes No results for input(s): TROPONINI in the last 168 hours. ------------------------------------------------------------------------------------------------------------------  RADIOLOGY:  Dg Chest Portable 1 View  Result Date: 11/05/2018 CLINICAL DATA:  Mid chest pain.  Shortness of breath. EXAM: PORTABLE CHEST 1 VIEW COMPARISON:  Radiograph 07/28/2018. CT 02/03/2018 FINDINGS: Chronic elevation of  left hemidiaphragm. Multifocal lung scarring, stable from prior exam. Unchanged heart size and mediastinal contours. No acute airspace disease, pulmonary edema, pleural effusion or pneumothorax. No acute osseous abnormalities. IMPRESSION: 1. No acute abnormality or change from prior exam. 2. Chronic elevation of left hemidiaphragm and multifocal lung scarring. Electronically Signed   By: Keith Rake M.D.   On: 11/05/2018 22:22     ASSESSMENT AND PLAN:   48 year old male patient with history of chronic congestive heart failure, COPD on home oxygen, coronary disease, sarcoidosis, hypertension, diabetes mellitus type 2, hyperlipidemia currently under hospitalist service  -Chest pain Troponins flat Cardiology consult appreciated Monitor patient on telemetry Check on echocardiogram No acute intervention recommended  -Acute on Chronic diastolic congestive heart failure exacerbation  Continue Lasix 80 mg daily for diuresis Monitor electrolytes Continue oxygen via [place]  -Dyspnea secondary to chf Continue diuresis with lasix one more day  -COPD Continue oxygen via nasal cannula as well as home dose inhalers and inhaled steroids  -Type 2 diabetes mellitus Diabetic diet with sliding scale coverage with insulin  -DVT prophylaxis Continue Eliquis for anticoagulation  -Hypertension uncontrolled Resume ACE inhibitor and beta-blocker PRN labetalol for control of blood pressure  -Tobacco abuse Tobacco cessation counseled to the patient for 6 minutes Patient refused nicotine patch  All the records are reviewed and case discussed with Care Management/Social Worker. Management plans discussed with the patient, family and they are in agreement.  CODE STATUS: Full code  DVT Prophylaxis: SCDs  TOTAL TIME TAKING CARE OF THIS PATIENT: 36 minutes.   POSSIBLE D/C IN 2 to 3 DAYS, DEPENDING ON CLINICAL CONDITION.  Saundra Shelling M.D on 11/07/2018 at 12:40 PM  Between 7am to 6pm - Pager -  [phone]  After 6pm go to [email or web address] - password [email or web address] [facility]  Office  [phone]  CC: Primary care physician; [place]  Note: This dictation was prepared with Dragon dictation along with smaller Company secretary. Any transcriptional errors that result from this process are unintentional.

## 2018-11-07 NOTE — Progress Notes (Signed)
VAST consulted for IV placement as pt wants R AC IV removed. Pt is currently not receiving IVF's or IV meds. Spoke with Joy, pt's nurse; educated it is the pt's best interest that an IV will not be placed at this time to decrease infection risk and allow for vein preservation. Further educated if an IV is needed emergently, a new STAT IV team consult should be placed with a comment as to why an emergent IV is needed. Joy verbalized understanding.

## 2018-11-08 LAB — BASIC METABOLIC PANEL
Anion gap: 8 (ref 5–15)
BUN: 20 mg/dL (ref 6–20)
CO2: 40 mmol/L — ABNORMAL HIGH (ref 22–32)
Calcium: 8.5 mg/dL — ABNORMAL LOW (ref 8.9–10.3)
Chloride: 93 mmol/L — ABNORMAL LOW (ref 98–111)
Creatinine, Ser: 1.46 mg/dL — ABNORMAL HIGH (ref 0.61–1.24)
GFR calc Af Amer: >60 mL/min (ref >60)
GFR calc non Af Amer: 56 mL/min — ABNORMAL LOW (ref >60)
Glucose, Bld: 136 mg/dL — ABNORMAL HIGH (ref 70–99)
Potassium: 4.3 mmol/L (ref 3.5–5.1)
Sodium: 141 mmol/L (ref 135–145)

## 2018-11-08 LAB — GLUCOSE, CAPILLARY: Glucose-Capillary: 151 mg/dL — ABNORMAL HIGH (ref 70–99)

## 2018-11-08 NOTE — Plan of Care (Signed)
  Problem: Activity: Goal: Ability to tolerate increased activity will improve Outcome: Progressing   Problem: Cardiac: Goal: Ability to achieve and maintain adequate cardiovascular perfusion will improve Outcome: Progressing   Problem: Education: Goal: Knowledge of General Education information will improve Description: Including pain rating scale, medication(s)/side effects and non-pharmacologic comfort measures Outcome: Progressing   Problem: Pain Managment: Goal: General experience of comfort will improve Outcome: Progressing

## 2018-11-08 NOTE — Discharge Summary (Signed)
[name] at [place] NAME: [name]    MR#:  [id number]  DATE OF BIRTH:  January 16, 1971  DATE OF ADMISSION:  11/05/2018 ADMITTING PHYSICIAN: Corey Foreman, MD  DATE OF DISCHARGE: 11/08/2018  PRIMARY CARE PHYSICIAN: [name]   ADMISSION DIAGNOSIS:  Atypical chest pain [R07.89] COPD exacerbation ([place]) [J44.1]  DISCHARGE DIAGNOSIS:  Atypical chest pain Acute on chronic diastolic heart failure exacerbation COPD Type 2 diabetes mellitus Hypertension  SECONDARY DIAGNOSIS:   Past Medical History:  Diagnosis Date  . CHF (congestive heart failure) ([place])   . COPD (chronic obstructive pulmonary disease) ([place])   . Coronary artery disease   . Diabetes mellitus without complication ([place])   . Hypercholesteremia unk  . Hypertension   . Myocardial infarction ([place])   . Sarcoidosis   . Sleep apnea    does not use CPAP regularly.  . Stroke [facility]      ADMITTING HISTORY The patient with past medical history of CHF, COPD, CAD, hypertension, sarcoidosis, diabetes and hyperlipidemia presents to the emergency department complaining of chest pain.  The patient reports centralized chest pain that began while he was washing dishes.  He admits to associated shortness of breath but denies nausea, vomiting or diaphoresis.  He has had sharp pains in his chest before but not while doing an activity with as as little exertion as washing dishes.  His chest discomfort was improved but not completely resolved by the time of arrival in the emergency department.  The patient was given multiple breathing treatments which improved his shortness of breath.  Laboratory evaluation revealed elevated troponin.  Once patient was stabilized emergency department staff called the hospitalist service for admission.  HOSPITAL COURSE:  Patient was admitted to telemetry and serial troponins were checked.  Was evaluated by cardiology who did not recommend any  acute intervention.  Patient continued diuresis with Lasix for CHF exacerbation.  Shortness of breath improved.  Patient comfortable on oxygen via nasal cannula.  Patient will follow-up with outpatient cardiology and continue cardiac medications.  Chest pain completely resolved.  Was more atypical in nature musculoskeletal.  CONSULTS OBTAINED:   Cardiology consult DRUG ALLERGIES:   Allergies  Allergen Reactions  . Penicillins Anaphylaxis and Other (See Comments)    Has patient had a PCN reaction causing immediate rash, facial/tongue/throat swelling, SOB or lightheadedness with hypotension: Yes Has patient had a PCN reaction causing severe rash involving mucus membranes or skin necrosis: No Has patient had a PCN reaction that required hospitalization: No Has patient had a PCN reaction occurring within the last 10 years: No If all of the above answers are "NO", then may proceed with Cephalosporin use.     DISCHARGE MEDICATIONS:   Allergies as of 11/08/2018      Reactions   Penicillins Anaphylaxis, Other (See Comments)   Has patient had a PCN reaction causing immediate rash, facial/tongue/throat swelling, SOB or lightheadedness with hypotension: Yes Has patient had a PCN reaction causing severe rash involving mucus membranes or skin necrosis: No Has patient had a PCN reaction that required hospitalization: No Has patient had a PCN reaction occurring within the last 10 years: No If all of the above answers are "NO", then may proceed with Cephalosporin use.      Medication List    TAKE these medications   albuterol 108 (90 Base) MCG/ACT inhaler Commonly known as: VENTOLIN HFA Inhale 1-2 puffs into the lungs every 4 (four) hours as needed for  wheezing or shortness of breath.   albuterol (2.5 MG/3ML) 0.083% nebulizer solution Commonly known as: PROVENTIL Take 3 mLs (2.5 mg total) by nebulization every 6 (six) hours as needed for wheezing or shortness of breath.   apixaban 5 MG Tabs  tablet Commonly known as: ELIQUIS Take 5 mg by mouth 2 (two) times daily.   aspirin 81 MG chewable tablet Chew 81 mg by mouth daily.   atorvastatin 40 MG tablet Commonly known as: LIPITOR Take 40 mg by mouth at bedtime.   Fluticasone-Salmeterol 250-50 MCG/DOSE Aepb Commonly known as: ADVAIR Inhale 1 puff into the lungs 2 (two) times daily.   furosemide 80 MG tablet Commonly known as: LASIX Take 1 tablet (80 mg total) by mouth daily. What changed:   when to take this  reasons to take this   insulin detemir 100 UNIT/ML injection Commonly known as: LEVEMIR Inject 40-50 Units into the skin at bedtime.   insulin lispro 100 UNIT/ML injection Commonly known as: HUMALOG Inject 12-15 Units into the skin 3 (three) times daily with meals. Sliding scale as needed   Klor-Con M20 20 MEQ tablet Generic drug: potassium chloride SA TAKE ONE TABLET BY MOUTH TWICE DAILY What changed: how much to take   lisinopril 30 MG tablet Commonly known as: ZESTRIL Take 30 mg by mouth at bedtime.   Melatonin 5 MG Tabs Take 5 mg by mouth at bedtime as needed for sleep.   methocarbamol 500 MG tablet Commonly known as: Robaxin 1-2 tablets qid prn muscle spasms   metoprolol tartrate 100 MG tablet Commonly known as: LOPRESSOR Take 1 tablet (100 mg total) by mouth 2 (two) times daily.   theophylline 300 MG 12 hr tablet Commonly known as: THEODUR Take 300 mg by mouth 2 (two) times daily.   tiotropium 18 MCG inhalation capsule Commonly known as: SPIRIVA Place 18 mcg into inhaler and inhale daily.       Today  Patient seen and evaluated today No shortness of breath No chest pain Tolerating diet okay Hemodynamically stable VITAL SIGNS:  Blood pressure 121/67, pulse 77, temperature 98.1 F (36.7 C), temperature source Oral, resp. rate 19, height 6' (1.829 m), weight 125.1 kg, SpO2 96 %.  I/O:    Intake/Output Summary (Last 24 hours) at 11/08/2018 0955 Last data filed at 11/08/2018  0549 Gross per 24 hour  Intake 720 ml  Output 2000 ml  Net -1280 ml    PHYSICAL EXAMINATION:  Physical Exam  GENERAL:  48 y.o.-year-old patient lying in the bed with no acute distress.  LUNGS: Normal breath sounds bilaterally, no wheezing, rales,rhonchi or crepitation. No use of accessory muscles of respiration.  CARDIOVASCULAR: S1, S2 normal. No murmurs, rubs, or gallops.  ABDOMEN: Soft, non-tender, non-distended. Bowel sounds present. No organomegaly or mass.  NEUROLOGIC: Moves all 4 extremities. PSYCHIATRIC: The patient is alert and oriented x 3.  SKIN: No obvious rash, lesion, or ulcer.   DATA REVIEW:   CBC Recent Labs  Lab 11/05/18 2317  WBC 6.6  HGB 15.1  HCT 48.1  PLT 202    Chemistries  Recent Labs  Lab 11/05/18 2317  11/08/18 0513  NA 142   < > 141  K 4.1   < > 4.3  CL 97*   < > 93*  CO2 38*   < > 40*  GLUCOSE 112*   < > 136*  BUN 18   < > 20  CREATININE 1.53*   < > 1.46*  CALCIUM 8.8*   < >  8.5*  AST 22  --   --   ALT 20  --   --   ALKPHOS 77  --   --   BILITOT 0.6  --   --    < > = values in this interval not displayed.    Cardiac Enzymes No results for input(s): TROPONINI in the last 168 hours.  Microbiology Results  Results for orders placed or performed during the hospital encounter of 11/05/18  SARS CORONAVIRUS 2 (TAT 6-24 HRS) Nasopharyngeal Nasopharyngeal Swab     Status: None   Collection Time: 11/06/18  5:56 AM   Specimen: Nasopharyngeal Swab  Result Value Ref Range Status   SARS Coronavirus 2 NEGATIVE NEGATIVE Final    Comment: (NOTE) SARS-CoV-2 target nucleic acids are NOT DETECTED. The SARS-CoV-2 RNA is generally detectable in upper and lower respiratory specimens during the acute phase of infection. Negative results do not preclude SARS-CoV-2 infection, do not rule out co-infections with other pathogens, and should not be used as the sole basis for treatment or other patient management decisions. Negative results must be  combined with clinical observations, patient history, and epidemiological information. The expected result is Negative. Fact Sheet for Patients: SugarRoll.be Fact Sheet for Healthcare Providers: https://www.woods-mathews.com/ This test is not yet approved or cleared by the [place] FDA and  has been authorized for detection and/or diagnosis of SARS-CoV-2 by FDA under an Emergency Use Authorization (EUA). This EUA will remain  in effect (meaning this test can be used) for the duration of the COVID-19 declaration under Section 56 4(b)(1) of the Act, 21 U.S.C. section 360bbb-3(b)(1), unless the authorization is terminated or revoked sooner. Performed at [facility], [place] [address] [place], [place] [address]     RADIOLOGY:  No results found.  Follow up with PCP in 1 week.  Management plans discussed with the patient, family and they are in agreement.  CODE STATUS:  Full code    Code Status Orders  (From admission, onward)         Start     Ordered   11/06/18 0958  Full code  Continuous     11/06/18 0957        Code Status History    Date Active Date Inactive Code Status Order ID Comments User Context   07/29/2018 0034 07/30/2018 1938 Full Code [id number]  Mayer Camel, NP ED   02/03/2018 2201 02/06/2018 1552 Full Code [id number]  Dustin Flock, MD Inpatient   07/15/2016 0632 07/16/2016 1741 Full Code [id number]  Saundra Shelling, MD Inpatient   07/01/2016 1701 07/03/2016 1853 Full Code [id number]  Henreitta Leber, MD Inpatient   03/26/2016 0043 03/28/2016 2001 Full Code [id number]  Hugelmeyer, Alexis, DO Inpatient   11/17/2015 1131 11/18/2015 1845 Full Code [id number]  Bettey Costa, MD Inpatient   02/11/2015 1417 02/21/2015 1841 Full Code [id number]  Henreitta Leber, MD Inpatient   12/28/2014 2134 12/30/2014 1602 Full Code [id number]  Fritzi Mandes, MD Inpatient   08/24/2014 0245 08/26/2014 1819 Full Code [id number]  Lytle Butte, MD ED    08/14/2014 1031 08/18/2014 1830 Full Code [id number]  Vaughan Basta, MD Inpatient   Advance Care Planning Activity    Advance Directive Documentation     Most Recent Value  Type of Advance Directive  Healthcare Power of Attorney  Pre-existing out of facility DNR order (yellow form or pink MOST form)  -  "MOST" Form in Place?  -      TOTAL TIME TAKING  CARE OF THIS PATIENT ON DAY OF DISCHARGE: more than 35 minutes.   Saundra Shelling M.D on 11/08/2018 at 9:55 AM  Between 7am to 6pm - Pager - [phone]  After 6pm go to [email or web address] - password [email or web address] [facility]  Office  [phone]  CC: Primary care physician; Welcome  Note: This dictation was prepared with Dragon dictation along with smaller Company secretary. Any transcriptional errors that result from this process are unintentional.

## 2018-11-16 ENCOUNTER — Emergency Department: Payer: Medicare HMO

## 2018-11-16 ENCOUNTER — Encounter: Payer: Self-pay | Admitting: Emergency Medicine

## 2018-11-16 ENCOUNTER — Other Ambulatory Visit: Payer: Self-pay

## 2018-11-16 ENCOUNTER — Emergency Department
Admission: EM | Admit: 2018-11-16 | Discharge: 2018-11-16 | Disposition: A | Discharge status: Home / Self Care | Payer: Medicare HMO | Source: Home / Self Care | Attending: Emergency Medicine | Admitting: Emergency Medicine

## 2018-11-16 DIAGNOSIS — Z7901 Long term (current) use of anticoagulants: Secondary | ICD-10-CM | POA: Insufficient documentation

## 2018-11-16 DIAGNOSIS — I130 Hypertensive heart and chronic kidney disease with heart failure and stage 1 through stage 4 chronic kidney disease, or unspecified chronic kidney disease: Secondary | ICD-10-CM | POA: Diagnosis not present

## 2018-11-16 DIAGNOSIS — I5042 Chronic combined systolic (congestive) and diastolic (congestive) heart failure: Secondary | ICD-10-CM | POA: Insufficient documentation

## 2018-11-16 DIAGNOSIS — I110 Hypertensive heart disease with heart failure: Secondary | ICD-10-CM | POA: Insufficient documentation

## 2018-11-16 DIAGNOSIS — R0602 Shortness of breath: Secondary | ICD-10-CM

## 2018-11-16 DIAGNOSIS — E119 Type 2 diabetes mellitus without complications: Secondary | ICD-10-CM | POA: Insufficient documentation

## 2018-11-16 DIAGNOSIS — J441 Chronic obstructive pulmonary disease with (acute) exacerbation: Secondary | ICD-10-CM | POA: Insufficient documentation

## 2018-11-16 DIAGNOSIS — F17210 Nicotine dependence, cigarettes, uncomplicated: Secondary | ICD-10-CM | POA: Insufficient documentation

## 2018-11-16 DIAGNOSIS — Z8673 Personal history of transient ischemic attack (TIA), and cerebral infarction without residual deficits: Secondary | ICD-10-CM | POA: Insufficient documentation

## 2018-11-16 DIAGNOSIS — Z79899 Other long term (current) drug therapy: Secondary | ICD-10-CM | POA: Insufficient documentation

## 2018-11-16 DIAGNOSIS — I509 Heart failure, unspecified: Secondary | ICD-10-CM

## 2018-11-16 LAB — COMPREHENSIVE METABOLIC PANEL
ALT: 22 U/L (ref 0–44)
AST: 20 U/L (ref 15–41)
Albumin: 3.5 g/dL (ref 3.5–5.0)
Alkaline Phosphatase: 78 U/L (ref 38–126)
Anion gap: 9 (ref 5–15)
BUN: 14 mg/dL (ref 6–20)
CO2: 36 mmol/L — ABNORMAL HIGH (ref 22–32)
Calcium: 8.7 mg/dL — ABNORMAL LOW (ref 8.9–10.3)
Chloride: 97 mmol/L — ABNORMAL LOW (ref 98–111)
Creatinine, Ser: 1.31 mg/dL — ABNORMAL HIGH (ref 0.61–1.24)
GFR calc Af Amer: >60 mL/min (ref >60)
GFR calc non Af Amer: >60 mL/min (ref >60)
Glucose, Bld: 179 mg/dL — ABNORMAL HIGH (ref 70–99)
Potassium: 4.2 mmol/L (ref 3.5–5.1)
Sodium: 142 mmol/L (ref 135–145)
Total Bilirubin: 0.6 mg/dL (ref 0.3–1.2)
Total Protein: 6.8 g/dL (ref 6.5–8.1)

## 2018-11-16 LAB — CBC WITH DIFFERENTIAL/PLATELET
Abs Immature Granulocytes: 0.02 10*3/uL (ref 0.00–0.07)
Basophils Absolute: 0 10*3/uL (ref 0.0–0.1)
Basophils Relative: 1 %
Eosinophils Absolute: 0.1 10*3/uL (ref 0.0–0.5)
Eosinophils Relative: 1 %
HCT: 47.1 % (ref 39.0–52.0)
Hemoglobin: 14.5 g/dL (ref 13.0–17.0)
Immature Granulocytes: 0 %
Lymphocytes Relative: 15 %
Lymphs Abs: 1 10*3/uL (ref 0.7–4.0)
MCH: 27.4 pg (ref 26.0–34.0)
MCHC: 30.8 g/dL (ref 30.0–36.0)
MCV: 89 fL (ref 80.0–100.0)
Monocytes Absolute: 0.9 10*3/uL (ref 0.1–1.0)
Monocytes Relative: 14 %
Neutro Abs: 4.4 10*3/uL (ref 1.7–7.7)
Neutrophils Relative %: 69 %
Platelets: 198 10*3/uL (ref 150–400)
RBC: 5.29 MIL/uL (ref 4.22–5.81)
RDW: 12.2 % (ref 11.5–15.5)
WBC: 6.4 10*3/uL (ref 4.0–10.5)
nRBC: 0 % (ref 0.0–0.2)

## 2018-11-16 LAB — BRAIN NATRIURETIC PEPTIDE: B Natriuretic Peptide: 60 pg/mL (ref 0.0–100.0)

## 2018-11-16 LAB — TROPONIN I (HIGH SENSITIVITY): Troponin I (High Sensitivity): 42 ng/L — ABNORMAL HIGH (ref <18)

## 2018-11-16 MED ORDER — IPRATROPIUM-ALBUTEROL 0.5-2.5 (3) MG/3ML IN SOLN
3.0000 mL | Freq: Once | RESPIRATORY_TRACT | Status: AC
  Administered 2018-11-16: 3 mL via RESPIRATORY_TRACT
  Filled 2018-11-16: qty 3

## 2018-11-16 NOTE — ED Notes (Signed)
Pt ambulated to the door and back a couple of times without assistance on 2L of O2. Pt O2 sat stayed between 92-96%. Denies any SOB during ambulation

## 2018-11-16 NOTE — ED Notes (Signed)
Pt given graham cracker, peanut butter, and water

## 2018-11-16 NOTE — ED Provider Notes (Signed)
[facility] Emergency Department Provider Note   ____________________________________________    I have reviewed the triage vital signs and the nursing notes.   HISTORY  Chief Complaint Shortness of Breath     HPI [name] is a 48 y.o. male with a history of CHF, COPD, coronary artery disease, diabetes, who presents with shortness of breath.  Patient apparently sleeps with BiPAP machine, does wear oxygen at home.  Today went to restaurant for his anniversary became markedly short of breath as they were leaving and felt weak.  No chest pain.  Was not wearing oxygen but is not required to as he goes out  Past Medical History:  Diagnosis Date  . CHF (congestive heart failure) ([place])   . COPD (chronic obstructive pulmonary disease) ([place])   . Coronary artery disease   . Diabetes mellitus without complication ([place])   . Hypercholesteremia unk  . Hypertension   . Myocardial infarction ([place])   . Sarcoidosis   . Sleep apnea    does not use CPAP regularly.  . Stroke [facility])     Patient Active Problem List   Diagnosis Date Noted  . Acute exacerbation of CHF (congestive heart failure) ([place]) 11/07/2018  . Chest pain 11/06/2018  . Acute exacerbation of chronic obstructive pulmonary disease (COPD) ([place]) 07/29/2018  . COPD with acute exacerbation ([place]) 02/03/2018  . COPD (chronic obstructive pulmonary disease) ([place]) 07/16/2016  . Chronic diastolic heart failure ([place]) 03/23/2016  . Hypercarbia   . COPD exacerbation ([place]) 11/17/2015  . HTN (hypertension) 06/14/2015  . Dental caries 03/08/2015  . Morbid obesity ([place]) 02/21/2015  . OSA and COPD overlap syndrome ([place]) 02/21/2015  . Thrombocytopenia ([place]) 02/21/2015  . Hypernatremia 02/21/2015  . Diabetes ([place]) 10/19/2014  . Chronic combined systolic and diastolic congestive heart failure ([place]) 10/18/2014  . Chronic obstructive pulmonary disease ([place]) 10/18/2014  . Sarcoidosis 04/22/2013  . Other and  unspecified hyperlipidemia 04/22/2013    Past Surgical History:  Procedure Laterality Date  . APPENDECTOMY    . PARTIAL NEPHRECTOMY      Prior to Admission medications   Medication Sig Start Date End Date Taking? Authorizing Provider  albuterol (PROVENTIL) (2.5 MG/3ML) 0.083% nebulizer solution Take 3 mLs (2.5 mg total) by nebulization every 6 (six) hours as needed for wheezing or shortness of breath. 07/30/18   Fritzi Mandes, MD  albuterol (VENTOLIN HFA) 108 (90 Base) MCG/ACT inhaler Inhale 1-2 puffs into the lungs every 4 (four) hours as needed for wheezing or shortness of breath.    [provider]  apixaban (ELIQUIS) 5 MG TABS tablet Take 5 mg by mouth 2 (two) times daily. 06/11/16   [provider]  aspirin 81 MG chewable tablet Chew 81 mg by mouth daily.    [provider]  atorvastatin (LIPITOR) 40 MG tablet Take 40 mg by mouth at bedtime.     [provider]  Fluticasone-Salmeterol (ADVAIR) 250-50 MCG/DOSE AEPB Inhale 1 puff into the lungs 2 (two) times daily.    [provider]  furosemide (LASIX) 80 MG tablet Take 1 tablet (80 mg total) by mouth daily. Patient taking differently: Take 80 mg by mouth daily as needed.  05/10/16   Darylene Price A, FNP  insulin detemir (LEVEMIR) 100 UNIT/ML injection Inject 40-50 Units into the skin at bedtime.     [provider]  insulin lispro (HUMALOG) 100 UNIT/ML injection Inject 12-15 Units into the skin 3 (three) times daily with meals. Sliding scale as  needed    [provider]  KLOR-CON M20 20 MEQ tablet TAKE ONE TABLET BY MOUTH TWICE DAILY Patient taking differently: Take 20 mEq by mouth 2 (two) times daily.  08/20/16   Darylene Price A, FNP  lisinopril (ZESTRIL) 30 MG tablet Take 30 mg by mouth at bedtime.     [provider]  Melatonin 5 MG TABS Take 5 mg by mouth at bedtime as needed for sleep.    [provider]  methocarbamol (ROBAXIN) 500 MG tablet 1-2 tablets qid  prn muscle spasms 07/22/16   Letitia Neri L, PA-C  metoprolol tartrate (LOPRESSOR) 100 MG tablet Take 1 tablet (100 mg total) by mouth 2 (two) times daily. 07/30/18   Fritzi Mandes, MD  theophylline (THEODUR) 300 MG 12 hr tablet Take 300 mg by mouth 2 (two) times daily.     [provider]  tiotropium (SPIRIVA) 18 MCG inhalation capsule Place 18 mcg into inhaler and inhale daily.    [provider]     Allergies Penicillins  Family History  Problem Relation Age of Onset  . Hypertension Mother   . Heart disease Mother   . Diabetes Mother   . Cancer Father     Social History Social History   Tobacco Use  . Smoking status: Current Every Day Smoker    Packs/day: 0.25    Years: 25.00    Pack years: 6.25    Types: Cigarettes    Last attempt to quit: 02/05/2015    Years since quitting: 3.7  . Smokeless tobacco: Never Used  Substance Use Topics  . Alcohol use: Yes    Alcohol/week: 0.0 standard drinks    Comment: occassional  . Drug use: Yes    Types: Marijuana    Comment: last use December 2016    Review of Systems  Constitutional: No fever/chills Eyes: No visual changes.  ENT: No sore throat. Cardiovascular: Denies chest pain. Respiratory: As above Gastrointestinal: No abdominal pain.  No nausea, no vomiting.   Genitourinary: Negative for dysuria. Musculoskeletal: Negative for back pain. Skin: Negative for rash. Neurological: Negative for headaches   ____________________________________________   PHYSICAL EXAM:  VITAL SIGNS: ED Triage Vitals  Enc Vitals Group     BP 11/16/18 1447 (!) 151/98     Pulse Rate 11/16/18 1447 99     Resp 11/16/18 1447 20     Temp 11/16/18 1447 99.2 F (37.3 C)     Temp Source 11/16/18 1447 Oral     SpO2 11/16/18 1447 93 %     Weight 11/16/18 1443 122 kg (269 lb)     Height 11/16/18 1443 1.842 m (6' 0.5")     Head Circumference --      Peak Flow --      Pain Score 11/16/18 1443 0     Pain Loc --      Pain  Edu? --      Excl. in [place]? --     Constitutional: Alert and oriented. Eyes: Conjunctivae are normal.  Head: Atraumatic. Nose: No congestion/rhinnorhea. Mouth/Throat: Mucous membranes are moist.    Cardiovascular: Normal rate, regular rhythm. Grossly normal heart sounds.  Good peripheral circulation. Respiratory: Increased respiratory effort with mild tachypnea, no retractions, bibasilar Rales, scattered mild wheezing Gastrointestinal: Soft and nontender. No distention.   Musculoskeletal: Bilateral lower extremity edema warm and well perfused Neurologic:  Normal speech and language. No gross focal neurologic deficits are appreciated.  Skin:  Skin is warm, dry and intact. No rash  noted. Psychiatric: Mood and affect are normal. Speech and behavior are normal.  ____________________________________________   LABS (all labs ordered are listed, but only abnormal results are displayed)  Labs Reviewed  COMPREHENSIVE METABOLIC PANEL - Abnormal; Notable for the following components:      Result Value   Chloride 97 (*)    CO2 36 (*)    Glucose, Bld 179 (*)    Creatinine, Ser 1.31 (*)    Calcium 8.7 (*)    All other components within normal limits  TROPONIN I (HIGH SENSITIVITY) - Abnormal; Notable for the following components:   Troponin I (High Sensitivity) 42 (*)    All other components within normal limits  CBC WITH DIFFERENTIAL/PLATELET  BRAIN NATRIURETIC PEPTIDE   ____________________________________________  EKG  ED ECG REPORT I, Lavonia Drafts, the attending physician, personally viewed and interpreted this ECG.  Date: 11/16/2018  Rhythm: normal sinus rhythm QRS Axis: normal Intervals: Right bundle branch block, left anterior fascicular block ST/T Wave abnormalities: normal Narrative Interpretation: no evidence of acute ischemia  ____________________________________________  RADIOLOGY  Chest x-ray unchanged from prior ____________________________________________    PROCEDURES  Procedure(s) performed: No  Procedures   Critical Care performed: No ____________________________________________   INITIAL IMPRESSION / ASSESSMENT AND PLAN / ED COURSE  Pertinent labs & imaging results that were available during my care of the patient were reviewed by me and considered in my medical decision making (see chart for details).  Patient with significant comorbidities presents with shortness of breath.  No fevers or chills to suggest pneumonia, suspect possible COPD versus CHF exacerbation or possibly component of both.  Pending labs, chest x-ray.  Patient placed on nasal cannula oxygen here, reports he is feeling somewhat better.  Will treat with DuoNeb while we await labs  Work-up here in the emergency department is overall reassuring, he has a chronically elevated troponin.  Chest x-ray is unchanged from prior.  He reports he is feeling well and wants to go home.  We did do an ambulatory pulse ox which he tolerated quite well with no shortness of breath  He and his wife feel comfortable with discharge, they know they can return anytime, he does have oxygen at home.    ____________________________________________   FINAL CLINICAL IMPRESSION(S) / ED DIAGNOSES  Final diagnoses:  SOB (shortness of breath)  Congestive heart failure, unspecified HF chronicity, unspecified heart failure type ([place])  COPD exacerbation ([place])        Note:  This document was prepared using Dragon voice recognition software and may include unintentional dictation errors.   Lavonia Drafts, MD 11/16/18 1719

## 2018-11-16 NOTE — ED Triage Notes (Signed)
Pt via EMS from home. Pt was at a restaurant and began getting SOB at around 1200. Pt states he wasn't doing anything out of the ordinary when this started. Pt has a hx of COPD and CHF, and was recently hospitalized for a COPD exacerbation. Pt normally on 2L when he in the house or car but usually and be off for a short amount of time. Per EMS, pt sat was 72% on RA at the restaurant. 3L increased sat to 92-94%

## 2018-11-16 NOTE — ED Notes (Signed)
Pt requested a humidifier for his oxygen. Attached humidifier to oxygen

## 2018-11-17 ENCOUNTER — Inpatient Hospital Stay
Admission: EM | Admit: 2018-11-17 | Discharge: 2018-11-21 | DRG: 291 | Disposition: A | Discharge status: Home / Self Care | Payer: Medicare HMO | Attending: Internal Medicine | Admitting: Internal Medicine

## 2018-11-17 ENCOUNTER — Other Ambulatory Visit: Payer: Self-pay

## 2018-11-17 ENCOUNTER — Encounter: Payer: Self-pay | Admitting: Emergency Medicine

## 2018-11-17 ENCOUNTER — Emergency Department: Payer: Medicare HMO

## 2018-11-17 ENCOUNTER — Inpatient Hospital Stay
Admit: 2018-11-17 | Discharge: 2018-11-17 | Disposition: A | Discharge status: Home / Self Care | Payer: Medicare HMO | Attending: Internal Medicine | Admitting: Internal Medicine

## 2018-11-17 DIAGNOSIS — D860 Sarcoidosis of lung: Secondary | ICD-10-CM | POA: Diagnosis present

## 2018-11-17 DIAGNOSIS — J9622 Acute and chronic respiratory failure with hypercapnia: Secondary | ICD-10-CM | POA: Diagnosis present

## 2018-11-17 DIAGNOSIS — I2510 Atherosclerotic heart disease of native coronary artery without angina pectoris: Secondary | ICD-10-CM | POA: Diagnosis present

## 2018-11-17 DIAGNOSIS — F17210 Nicotine dependence, cigarettes, uncomplicated: Secondary | ICD-10-CM | POA: Diagnosis present

## 2018-11-17 DIAGNOSIS — J9621 Acute and chronic respiratory failure with hypoxia: Secondary | ICD-10-CM | POA: Diagnosis present

## 2018-11-17 DIAGNOSIS — I252 Old myocardial infarction: Secondary | ICD-10-CM

## 2018-11-17 DIAGNOSIS — R0602 Shortness of breath: Secondary | ICD-10-CM | POA: Diagnosis present

## 2018-11-17 DIAGNOSIS — Z9981 Dependence on supplemental oxygen: Secondary | ICD-10-CM

## 2018-11-17 DIAGNOSIS — E785 Hyperlipidemia, unspecified: Secondary | ICD-10-CM | POA: Diagnosis present

## 2018-11-17 DIAGNOSIS — Z905 Acquired absence of kidney: Secondary | ICD-10-CM

## 2018-11-17 DIAGNOSIS — J9601 Acute respiratory failure with hypoxia: Secondary | ICD-10-CM

## 2018-11-17 DIAGNOSIS — E875 Hyperkalemia: Secondary | ICD-10-CM | POA: Diagnosis present

## 2018-11-17 DIAGNOSIS — Z833 Family history of diabetes mellitus: Secondary | ICD-10-CM | POA: Diagnosis not present

## 2018-11-17 DIAGNOSIS — E872 Acidosis: Secondary | ICD-10-CM | POA: Diagnosis present

## 2018-11-17 DIAGNOSIS — J441 Chronic obstructive pulmonary disease with (acute) exacerbation: Secondary | ICD-10-CM | POA: Diagnosis present

## 2018-11-17 DIAGNOSIS — E1122 Type 2 diabetes mellitus with diabetic chronic kidney disease: Secondary | ICD-10-CM | POA: Diagnosis present

## 2018-11-17 DIAGNOSIS — Z7982 Long term (current) use of aspirin: Secondary | ICD-10-CM

## 2018-11-17 DIAGNOSIS — Z8673 Personal history of transient ischemic attack (TIA), and cerebral infarction without residual deficits: Secondary | ICD-10-CM | POA: Diagnosis not present

## 2018-11-17 DIAGNOSIS — Z809 Family history of malignant neoplasm, unspecified: Secondary | ICD-10-CM

## 2018-11-17 DIAGNOSIS — I5023 Acute on chronic systolic (congestive) heart failure: Secondary | ICD-10-CM | POA: Diagnosis present

## 2018-11-17 DIAGNOSIS — M62838 Other muscle spasm: Secondary | ICD-10-CM | POA: Diagnosis present

## 2018-11-17 DIAGNOSIS — N183 Chronic kidney disease, stage 3 unspecified: Secondary | ICD-10-CM | POA: Diagnosis present

## 2018-11-17 DIAGNOSIS — N179 Acute kidney failure, unspecified: Secondary | ICD-10-CM | POA: Diagnosis present

## 2018-11-17 DIAGNOSIS — I130 Hypertensive heart and chronic kidney disease with heart failure and stage 1 through stage 4 chronic kidney disease, or unspecified chronic kidney disease: Principal | ICD-10-CM | POA: Diagnosis present

## 2018-11-17 DIAGNOSIS — Z8249 Family history of ischemic heart disease and other diseases of the circulatory system: Secondary | ICD-10-CM | POA: Diagnosis not present

## 2018-11-17 DIAGNOSIS — E669 Obesity, unspecified: Secondary | ICD-10-CM | POA: Diagnosis present

## 2018-11-17 DIAGNOSIS — I482 Chronic atrial fibrillation, unspecified: Secondary | ICD-10-CM | POA: Diagnosis present

## 2018-11-17 DIAGNOSIS — Z7901 Long term (current) use of anticoagulants: Secondary | ICD-10-CM

## 2018-11-17 DIAGNOSIS — Z20828 Contact with and (suspected) exposure to other viral communicable diseases: Secondary | ICD-10-CM | POA: Diagnosis present

## 2018-11-17 DIAGNOSIS — G4733 Obstructive sleep apnea (adult) (pediatric): Secondary | ICD-10-CM | POA: Diagnosis present

## 2018-11-17 DIAGNOSIS — Z794 Long term (current) use of insulin: Secondary | ICD-10-CM

## 2018-11-17 DIAGNOSIS — I5042 Chronic combined systolic (congestive) and diastolic (congestive) heart failure: Secondary | ICD-10-CM

## 2018-11-17 DIAGNOSIS — Z7951 Long term (current) use of inhaled steroids: Secondary | ICD-10-CM

## 2018-11-17 DIAGNOSIS — Z79899 Other long term (current) drug therapy: Secondary | ICD-10-CM

## 2018-11-17 LAB — CBC WITH DIFFERENTIAL/PLATELET
Abs Immature Granulocytes: 0.02 10*3/uL (ref 0.00–0.07)
Basophils Absolute: 0.1 10*3/uL (ref 0.0–0.1)
Basophils Relative: 1 %
Eosinophils Absolute: 0.1 10*3/uL (ref 0.0–0.5)
Eosinophils Relative: 1 %
HCT: 50 % (ref 39.0–52.0)
Hemoglobin: 15.2 g/dL (ref 13.0–17.0)
Immature Granulocytes: 0 %
Lymphocytes Relative: 17 %
Lymphs Abs: 1.4 10*3/uL (ref 0.7–4.0)
MCH: 27.6 pg (ref 26.0–34.0)
MCHC: 30.4 g/dL (ref 30.0–36.0)
MCV: 90.7 fL (ref 80.0–100.0)
Monocytes Absolute: 1.3 10*3/uL — ABNORMAL HIGH (ref 0.1–1.0)
Monocytes Relative: 16 %
Neutro Abs: 5.4 10*3/uL (ref 1.7–7.7)
Neutrophils Relative %: 65 %
Platelets: 259 10*3/uL (ref 150–400)
RBC: 5.51 MIL/uL (ref 4.22–5.81)
RDW: 12.4 % (ref 11.5–15.5)
WBC: 8.3 10*3/uL (ref 4.0–10.5)
nRBC: 0 % (ref 0.0–0.2)

## 2018-11-17 LAB — COMPREHENSIVE METABOLIC PANEL
ALT: 47 U/L — ABNORMAL HIGH (ref 0–44)
AST: 53 U/L — ABNORMAL HIGH (ref 15–41)
Albumin: 3.6 g/dL (ref 3.5–5.0)
Alkaline Phosphatase: 88 U/L (ref 38–126)
Anion gap: 4 — ABNORMAL LOW (ref 5–15)
BUN: 16 mg/dL (ref 6–20)
CO2: 39 mmol/L — ABNORMAL HIGH (ref 22–32)
Calcium: 8.3 mg/dL — ABNORMAL LOW (ref 8.9–10.3)
Chloride: 98 mmol/L (ref 98–111)
Creatinine, Ser: 1.46 mg/dL — ABNORMAL HIGH (ref 0.61–1.24)
GFR calc Af Amer: >60 mL/min (ref >60)
GFR calc non Af Amer: 56 mL/min — ABNORMAL LOW (ref >60)
Glucose, Bld: 136 mg/dL — ABNORMAL HIGH (ref 70–99)
Potassium: 5.1 mmol/L (ref 3.5–5.1)
Sodium: 141 mmol/L (ref 135–145)
Total Bilirubin: 0.7 mg/dL (ref 0.3–1.2)
Total Protein: 7 g/dL (ref 6.5–8.1)

## 2018-11-17 LAB — BLOOD GAS, VENOUS
Acid-Base Excess: 9.3 mmol/L — ABNORMAL HIGH (ref 0.0–2.0)
Bicarbonate: 42.2 mmol/L — ABNORMAL HIGH (ref 20.0–28.0)
O2 Saturation: 62.1 %
Patient temperature: 37
Patient temperature: 37
pCO2, Ven: >120 mmHg (ref 44.0–60.0)
pCO2, Ven: 113 mmHg (ref 44.0–60.0)
pH, Ven: 7.16 — CL (ref 7.250–7.430)
pH, Ven: 7.18 — CL (ref 7.250–7.430)
pO2, Ven: 110 mmHg — ABNORMAL HIGH (ref 32.0–45.0)
pO2, Ven: 41 mmHg (ref 32.0–45.0)

## 2018-11-17 LAB — ECHOCARDIOGRAM COMPLETE
Height: 72 in
Weight: 4320 oz

## 2018-11-17 LAB — SARS CORONAVIRUS 2 BY RT PCR (HOSPITAL ORDER, PERFORMED IN ~~LOC~~ HOSPITAL LAB): SARS Coronavirus 2: NEGATIVE

## 2018-11-17 LAB — GLUCOSE, CAPILLARY
Glucose-Capillary: 139 mg/dL — ABNORMAL HIGH (ref 70–99)
Glucose-Capillary: 172 mg/dL — ABNORMAL HIGH (ref 70–99)
Glucose-Capillary: 195 mg/dL — ABNORMAL HIGH (ref 70–99)
Glucose-Capillary: 208 mg/dL — ABNORMAL HIGH (ref 70–99)

## 2018-11-17 LAB — TROPONIN I (HIGH SENSITIVITY)
Troponin I (High Sensitivity): 54 ng/L — ABNORMAL HIGH (ref <18)
Troponin I (High Sensitivity): 48 ng/L — ABNORMAL HIGH (ref <18)

## 2018-11-17 LAB — BRAIN NATRIURETIC PEPTIDE: B Natriuretic Peptide: 103 pg/mL — ABNORMAL HIGH (ref 0.0–100.0)

## 2018-11-17 LAB — MRSA PCR SCREENING: MRSA by PCR: NEGATIVE

## 2018-11-17 MED ORDER — HYDROCODONE-ACETAMINOPHEN 5-325 MG PO TABS: 1.0000 | ORAL_TABLET | ORAL | Status: DC | PRN

## 2018-11-17 MED ORDER — FUROSEMIDE 10 MG/ML IJ SOLN
40.0000 mg | Freq: Two times a day (BID) | INTRAMUSCULAR | Status: DC
  Administered 2018-11-17 – 2018-11-19 (×4): 40 mg via INTRAVENOUS
  Filled 2018-11-17 (×4): qty 4

## 2018-11-17 MED ORDER — CHLORHEXIDINE GLUCONATE CLOTH 2 % EX PADS
6.0000 | MEDICATED_PAD | Freq: Every day | CUTANEOUS | Status: DC
  Administered 2018-11-18 – 2018-11-21 (×4): 6 via TOPICAL

## 2018-11-17 MED ORDER — INSULIN ASPART 100 UNIT/ML ~~LOC~~ SOLN
0.0000 [IU] | Freq: Four times a day (QID) | SUBCUTANEOUS | Status: DC
  Administered 2018-11-17: 2 [IU] via SUBCUTANEOUS
  Administered 2018-11-17: 1 [IU] via SUBCUTANEOUS
  Administered 2018-11-18: 3 [IU] via SUBCUTANEOUS
  Administered 2018-11-18 (×2): 2 [IU] via SUBCUTANEOUS
  Administered 2018-11-19: 01:00:00 5 [IU] via SUBCUTANEOUS
  Administered 2018-11-19: 2 [IU] via SUBCUTANEOUS
  Filled 2018-11-17 (×7): qty 1

## 2018-11-17 MED ORDER — SODIUM CHLORIDE 0.9 % IV SOLN: INTRAVENOUS | Status: DC

## 2018-11-17 MED ORDER — ACETAMINOPHEN 325 MG PO TABS: 650.0000 mg | ORAL_TABLET | Freq: Four times a day (QID) | ORAL | Status: DC | PRN

## 2018-11-17 MED ORDER — METHYLPREDNISOLONE SODIUM SUCC 40 MG IJ SOLR
40.0000 mg | Freq: Every day | INTRAMUSCULAR | Status: DC
  Administered 2018-11-17 – 2018-11-21 (×5): 40 mg via INTRAVENOUS
  Filled 2018-11-17 (×5): qty 1

## 2018-11-17 MED ORDER — HYDRALAZINE HCL 20 MG/ML IJ SOLN
10.0000 mg | Freq: Four times a day (QID) | INTRAMUSCULAR | Status: DC | PRN
  Filled 2018-11-17: qty 1

## 2018-11-17 MED ORDER — IPRATROPIUM-ALBUTEROL 0.5-2.5 (3) MG/3ML IN SOLN
3.0000 mL | Freq: Once | RESPIRATORY_TRACT | Status: AC
  Administered 2018-11-17: 09:00:00 3 mL via RESPIRATORY_TRACT

## 2018-11-17 MED ORDER — DOCUSATE SODIUM 100 MG PO CAPS
100.0000 mg | ORAL_CAPSULE | Freq: Two times a day (BID) | ORAL | Status: DC
  Administered 2018-11-19: 100 mg via ORAL
  Filled 2018-11-17 (×9): qty 1

## 2018-11-17 MED ORDER — ALBUTEROL SULFATE (2.5 MG/3ML) 0.083% IN NEBU
5.0000 mg | INHALATION_SOLUTION | Freq: Once | RESPIRATORY_TRACT | Status: AC
  Administered 2018-11-17: 5 mg via RESPIRATORY_TRACT
  Filled 2018-11-17: qty 6

## 2018-11-17 MED ORDER — ONDANSETRON HCL 4 MG PO TABS: 4.0000 mg | ORAL_TABLET | Freq: Four times a day (QID) | ORAL | Status: DC | PRN

## 2018-11-17 MED ORDER — ENOXAPARIN SODIUM 40 MG/0.4ML ~~LOC~~ SOLN
40.0000 mg | SUBCUTANEOUS | Status: DC
  Filled 2018-11-17: qty 0.4

## 2018-11-17 MED ORDER — ACETAMINOPHEN 650 MG RE SUPP: 650.0000 mg | Freq: Four times a day (QID) | RECTAL | Status: DC | PRN

## 2018-11-17 MED ORDER — PERFLUTREN LIPID MICROSPHERE
1.0000 mL | INTRAVENOUS | Status: AC | PRN
  Administered 2018-11-17: 4 mL via INTRAVENOUS
  Filled 2018-11-17: qty 10

## 2018-11-17 MED ORDER — METHYLPREDNISOLONE SODIUM SUCC 125 MG IJ SOLR
125.0000 mg | Freq: Once | INTRAMUSCULAR | Status: AC
  Administered 2018-11-17: 09:00:00 125 mg via INTRAVENOUS
  Filled 2018-11-17: qty 2

## 2018-11-17 MED ORDER — IPRATROPIUM-ALBUTEROL 0.5-2.5 (3) MG/3ML IN SOLN
3.0000 mL | RESPIRATORY_TRACT | Status: DC
  Administered 2018-11-17 (×4): 3 mL via RESPIRATORY_TRACT
  Filled 2018-11-17 (×4): qty 3

## 2018-11-17 MED ORDER — ORAL CARE MOUTH RINSE
15.0000 mL | Freq: Two times a day (BID) | OROMUCOSAL | Status: DC
  Administered 2018-11-18 – 2018-11-21 (×7): 15 mL via OROMUCOSAL

## 2018-11-17 MED ORDER — IPRATROPIUM-ALBUTEROL 0.5-2.5 (3) MG/3ML IN SOLN
3.0000 mL | Freq: Once | RESPIRATORY_TRACT | Status: AC
  Administered 2018-11-17: 3 mL via RESPIRATORY_TRACT
  Filled 2018-11-17: qty 3

## 2018-11-17 MED ORDER — LEVOFLOXACIN IN D5W 750 MG/150ML IV SOLN: 750.0000 mg | Freq: Once | INTRAVENOUS | Status: DC

## 2018-11-17 MED ORDER — LEVOFLOXACIN 750 MG PO TABS
750.0000 mg | ORAL_TABLET | Freq: Every day | ORAL | Status: DC
  Administered 2018-11-18: 10:00:00 750 mg via ORAL
  Filled 2018-11-17 (×2): qty 1

## 2018-11-17 MED ORDER — ONDANSETRON HCL 4 MG/2ML IJ SOLN: 4.0000 mg | Freq: Four times a day (QID) | INTRAMUSCULAR | Status: DC | PRN

## 2018-11-17 MED ORDER — CHLORHEXIDINE GLUCONATE 0.12 % MT SOLN
15.0000 mL | Freq: Two times a day (BID) | OROMUCOSAL | Status: DC
  Administered 2018-11-17 – 2018-11-21 (×8): 15 mL via OROMUCOSAL
  Filled 2018-11-17 (×8): qty 15

## 2018-11-17 MED ORDER — LEVOFLOXACIN IN D5W 750 MG/150ML IV SOLN
750.0000 mg | Freq: Once | INTRAVENOUS | Status: AC
  Administered 2018-11-17: 750 mg via INTRAVENOUS
  Filled 2018-11-17: qty 150

## 2018-11-17 MED ORDER — BISACODYL 5 MG PO TBEC: 5.0000 mg | DELAYED_RELEASE_TABLET | Freq: Every day | ORAL | Status: DC | PRN

## 2018-11-17 MED ORDER — IPRATROPIUM-ALBUTEROL 0.5-2.5 (3) MG/3ML IN SOLN
3.0000 mL | Freq: Once | RESPIRATORY_TRACT | Status: AC
  Administered 2018-11-17: 3 mL via RESPIRATORY_TRACT

## 2018-11-17 NOTE — Progress Notes (Signed)
*  PRELIMINARY RESULTS* Echocardiogram 2D Echocardiogram has been performed. Definity IV Contrast used on this study.  [name] 11/17/2018, 6:25 PM

## 2018-11-17 NOTE — ED Notes (Signed)
Care handoff: Report given to Leonides Schanz, RN.

## 2018-11-17 NOTE — ED Notes (Signed)
Date and time results received: 11/17/18 0816 (use smartphrase ".now" to insert current time)  Test: Pco2 Critical Value:>120  Name of Provider Notified: [facility]   Orders Received? Or Actions Taken?: Will initiate bipap as soon as resp arrives

## 2018-11-17 NOTE — ED Notes (Addendum)
Pt resting in bed, no distress noted, states "no" when asked if he is having any trouble breathing. Blood drawn per order, bed locked and low, call bell in reach.

## 2018-11-17 NOTE — ED Notes (Signed)
Resting well with bipap intact, RR 30.

## 2018-11-17 NOTE — ED Provider Notes (Signed)
[facility] Emergency Department Provider Note  ____________________________________________   First MD Initiated Contact with Patient 11/17/18 [phone]     (approximate)  I have reviewed the triage vital signs and the nursing notes.  History  Chief Complaint Shortness of Breath    HPI [name] is a 48 y.o. male with history of CHF, COPD on 3 L [place] at home, CAD, DM who presents to the emergency department for shortness of breath. Per EMS, patient's oxygen was in the 70's on 3 L [place] on their arrival, placed on NRB with improvement into the high 90's. He reports SOB, but is unable to give a timeline or further history, states this is because he is tired. He feels mildly improved with the NRB but states any time he moves or adjusts he gets extremely SOB. He denies any fevers, chills, cough, nausea, vomiting, sick contacts, or changes to his baseline LE edema.    Past Medical Hx Past Medical History:  Diagnosis Date  . CHF (congestive heart failure) ([place])   . COPD (chronic obstructive pulmonary disease) ([place])   . Coronary artery disease   . Diabetes mellitus without complication ([place])   . Hypercholesteremia unk  . Hypertension   . Myocardial infarction ([place])   . Sarcoidosis   . Sleep apnea    does not use CPAP regularly.  . Stroke [facility])     Problem List Patient Active Problem List   Diagnosis Date Noted  . Acute exacerbation of CHF (congestive heart failure) ([place]) 11/07/2018  . Chest pain 11/06/2018  . Acute exacerbation of chronic obstructive pulmonary disease (COPD) ([place]) 07/29/2018  . COPD with acute exacerbation ([place]) 02/03/2018  . COPD (chronic obstructive pulmonary disease) ([place]) 07/16/2016  . Chronic diastolic heart failure ([place]) 03/23/2016  . Hypercarbia   . COPD exacerbation ([place]) 11/17/2015  . HTN (hypertension) 06/14/2015  . Dental caries 03/08/2015  . Morbid obesity ([place]) 02/21/2015  . OSA and COPD overlap syndrome ([place]) 02/21/2015   . Thrombocytopenia ([place]) 02/21/2015  . Hypernatremia 02/21/2015  . Diabetes ([place]) 10/19/2014  . Chronic combined systolic and diastolic congestive heart failure ([place]) 10/18/2014  . Chronic obstructive pulmonary disease ([place]) 10/18/2014  . Sarcoidosis 04/22/2013  . Other and unspecified hyperlipidemia 04/22/2013    Past Surgical Hx Past Surgical History:  Procedure Laterality Date  . APPENDECTOMY    . PARTIAL NEPHRECTOMY      Medications Prior to Admission medications   Medication Sig Start Date End Date Taking? Authorizing Provider  albuterol (PROVENTIL) (2.5 MG/3ML) 0.083% nebulizer solution Take 3 mLs (2.5 mg total) by nebulization every 6 (six) hours as needed for wheezing or shortness of breath. 07/30/18   Fritzi Mandes, MD  albuterol (VENTOLIN HFA) 108 (90 Base) MCG/ACT inhaler Inhale 1-2 puffs into the lungs every 4 (four) hours as needed for wheezing or shortness of breath.    [provider]  apixaban (ELIQUIS) 5 MG TABS tablet Take 5 mg by mouth 2 (two) times daily. 06/11/16   [provider]  aspirin 81 MG chewable tablet Chew 81 mg by mouth daily.    [provider]  atorvastatin (LIPITOR) 40 MG tablet Take 40 mg by mouth at bedtime.     [provider]  Fluticasone-Salmeterol (ADVAIR) 250-50 MCG/DOSE AEPB Inhale 1 puff into the lungs 2 (two) times daily.    [provider]  furosemide (LASIX) 80 MG tablet Take 1 tablet (80 mg total) by mouth daily. Patient taking differently: Take 80 mg  by mouth daily as needed.  05/10/16   Darylene Price A, FNP  insulin detemir (LEVEMIR) 100 UNIT/ML injection Inject 40-50 Units into the skin at bedtime.     [provider]  insulin lispro (HUMALOG) 100 UNIT/ML injection Inject 12-15 Units into the skin 3 (three) times daily with meals. Sliding scale as needed    [provider]  KLOR-CON M20 20 MEQ tablet TAKE ONE TABLET BY MOUTH TWICE DAILY Patient taking differently: Take 20 mEq by  mouth 2 (two) times daily.  08/20/16   Darylene Price A, FNP  lisinopril (ZESTRIL) 30 MG tablet Take 30 mg by mouth at bedtime.     [provider]  Melatonin 5 MG TABS Take 5 mg by mouth at bedtime as needed for sleep.    [provider]  methocarbamol (ROBAXIN) 500 MG tablet 1-2 tablets qid prn muscle spasms 07/22/16   Letitia Neri L, PA-C  metoprolol tartrate (LOPRESSOR) 100 MG tablet Take 1 tablet (100 mg total) by mouth 2 (two) times daily. 07/30/18   Fritzi Mandes, MD  theophylline (THEODUR) 300 MG 12 hr tablet Take 300 mg by mouth 2 (two) times daily.     [provider]  tiotropium (SPIRIVA) 18 MCG inhalation capsule Place 18 mcg into inhaler and inhale daily.    [provider]    Allergies Penicillins  Family Hx Family History  Problem Relation Age of Onset  . Hypertension Mother   . Heart disease Mother   . Diabetes Mother   . Cancer Father     Social Hx Social History   Tobacco Use  . Smoking status: Current Every Day Smoker    Packs/day: 0.25    Years: 25.00    Pack years: 6.25    Types: Cigarettes    Last attempt to quit: 02/05/2015    Years since quitting: 3.7  . Smokeless tobacco: Never Used  Substance Use Topics  . Alcohol use: Yes    Alcohol/week: 0.0 standard drinks    Comment: occassional  . Drug use: Yes    Types: Marijuana    Comment: last use December 2016     Review of Systems  Constitutional: Negative for fever, chills. Eyes: Negative for visual changes. ENT: Negative for sore throat. Cardiovascular: Negative for chest pain. Respiratory: + for shortness of breath. Gastrointestinal: Negative for nausea, vomiting.  Genitourinary: Negative for dysuria. Musculoskeletal: Negative for leg swelling. Skin: Negative for rash. Neurological: Negative for for headaches.   Physical Exam  Vital Signs: ED Triage Vitals  Enc Vitals Group     BP 11/17/18 0733 (!) 146/94     Pulse Rate 11/17/18 0733 82     Resp  11/17/18 0733 (!) 32     Temp 11/17/18 0733 98 F (36.7 C)     Temp Source 11/17/18 0733 Oral     SpO2 11/17/18 0733 99 %     Weight 11/17/18 0734 270 lb (122.5 kg)     Height 11/17/18 0734 6' (1.829 m)     Head Circumference --      Peak Flow --      Pain Score 11/17/18 0734 0     Pain Loc --      Pain Edu? --      Excl. in [place]? --     Constitutional: Alert and oriented. Sleepy, but rouses easily to voice.  Head: Normocephalic. Atraumatic. Eyes: Conjunctivae clear. Sclera anicteric. Nose: No congestion. No rhinorrhea. Mouth/Throat: Mucous membranes are moist.  Neck:  No stridor.   Cardiovascular: Normal rate. Extremities well perfused. Respiratory: Tachypnea. 99% on NRB. SOB with any adjustments in bed. Decreased air movement throughout.  Gastrointestinal: Soft. Non-tender. Non-distended.  Musculoskeletal: Bilateral, symmetric LE edema to level of the knee. Neurologic:  Normal speech and language. No gross focal neurologic deficits are appreciated.  Skin: Skin is warm, dry and intact. No rash noted. Psychiatric: Mood and affect are appropriate for situation.  EKG  Personally reviewed.   Rate: 82 Rhythm: sinus Axis: LAD Intervals: RBBB, LAFB No acute ischemic changes No STEMI    Radiology  XR: IMPRESSION: Stable scarring and left diaphragm elevation. No acute superimposed finding.     Procedures  Procedure(s) performed (including critical care):  .Critical Care Performed by: Lilia Pro., MD Authorized by: Lilia Pro., MD   Critical care provider statement:    Critical care time (minutes):  30   Critical care was necessary to treat or prevent imminent or life-threatening deterioration of the following conditions:  Respiratory failure   Critical care was time spent personally by me on the following activities:  Discussions with consultants, evaluation of patient's response to treatment, examination of patient, ordering and performing treatments and  interventions, ordering and review of laboratory studies, ordering and review of radiographic studies, pulse oximetry, re-evaluation of patient's condition, obtaining history from patient or surrogate and review of old charts     Initial Impression / Assessment and Plan / ED Course  48 y.o. male with history as above who presents to the ED for SOB, found to be hypoxic with EMS. Decreased air movement throughout. Oxygen 99% on NRB.  Ddx: COPD exacerbation, CHF, perhaps combination of both.   Plan: labs, XR, supplemental oxygen  XR is stable compared to prior. VBG reveals respiratory acidosis, with pH 7.16 and pCO2 greater than 120. Will initiate BiPAP, DuoNeb x 3 through BiPAP, steroids, dose of antibiotics given need for NIPPV. Discussed with hospitalist for admission.    Final Clinical Impression(s) / ED Diagnosis  Final diagnoses:  SOB (shortness of breath)  Acute respiratory failure with hypoxia and hypercarbia (HCC)       Note:  This document was prepared using Dragon voice recognition software and may include unintentional dictation errors.   Lilia Pro., MD 11/17/18 1054

## 2018-11-17 NOTE — ED Notes (Signed)
Pt tolerating bipap well. No distress noted, states he is doing okay. RR 30.

## 2018-11-17 NOTE — ED Notes (Signed)
Bipap initiated per respiratory. Pt tolerated well.

## 2018-11-17 NOTE — H&P (Signed)
[place] [facility] [place] NAME: [name]    MR#:  [id number]  DATE OF BIRTH:  Jun 03, 1970  DATE OF ADMISSION:  11/17/2018  PRIMARY CARE PHYSICIAN: [place]   REQUESTING/REFERRING PHYSICIAN: Dr. Fonnie Birkenhead  CHIEF COMPLAINT: Shortness of breath   Chief Complaint  Patient presents with  . Shortness of Breath    HISTORY OF PRESENT ILLNESS:  [name]  is a 48 y.o. male with a known history of essential hypertension, hyperlipidemia, COPD, diabetes mellitus type 2, CAD brought in because of respiratory distress, hypoxia with oxygen saturation 70% on 3 L.  Patient was seen last night for respiratory distress his blood work is unremarkable, because patient's and worse yesterday and wanted to go home discharged home yesterday comes back today with respiratory distress with hypoxia, ABG showed pH 7.1, PCO2 more than 100, started on BiPAP.  Patient is unresponsive.  COVID-19 test is negative.  Chest x-ray showed stable scarring and elevation of left diaphragm but otherwise no acute findings.  PAST MEDICAL HISTORY:   Past Medical History:  Diagnosis Date  . CHF (congestive heart failure) ([place])   . COPD (chronic obstructive pulmonary disease) ([place])   . Coronary artery disease   . Diabetes mellitus without complication ([place])   . Hypercholesteremia unk  . Hypertension   . Myocardial infarction ([place])   . Sarcoidosis   . Sleep apnea    does not use CPAP regularly.  . Stroke [facility])     PAST SURGICAL HISTOIRY:   Past Surgical History:  Procedure Laterality Date  . APPENDECTOMY    . PARTIAL NEPHRECTOMY      SOCIAL HISTORY:   Social History   Tobacco Use  . Smoking status: Current Every Day Smoker    Packs/day: 0.25    Years: 25.00    Pack years: 6.25    Types: Cigarettes    Last attempt to quit: 02/05/2015    Years since quitting: 3.7  . Smokeless tobacco: Never Used  Substance Use Topics  . Alcohol  use: Yes    Alcohol/week: 0.0 standard drinks    Comment: occassional    FAMILY HISTORY:   Family History  Problem Relation Age of Onset  . Hypertension Mother   . Heart disease Mother   . Diabetes Mother   . Cancer Father     DRUG ALLERGIES:   Allergies  Allergen Reactions  . Penicillins Anaphylaxis and Other (See Comments)    Has patient had a PCN reaction causing immediate rash, facial/tongue/throat swelling, SOB or lightheadedness with hypotension: Yes Has patient had a PCN reaction causing severe rash involving mucus membranes or skin necrosis: No Has patient had a PCN reaction that required hospitalization: No Has patient had a PCN reaction occurring within the last 10 years: No If all of the above answers are "NO", then may proceed with Cephalosporin use.     REVIEW OF SYSTEMS: Patient is unresponsive unable to obtain review of systems because of that.   MEDICATIONS AT HOME:   Prior to Admission medications   Medication Sig Start Date End Date Taking? Authorizing Provider  albuterol (PROVENTIL) (2.5 MG/3ML) 0.083% nebulizer solution Take 3 mLs (2.5 mg total) by nebulization every 6 (six) hours as needed for wheezing or shortness of breath. 07/30/18   Fritzi Mandes, MD  albuterol (VENTOLIN HFA) 108 (90 Base) MCG/ACT inhaler Inhale 1-2 puffs into the lungs every 4 (four) hours as needed for wheezing or shortness of  breath.    [provider]  apixaban (ELIQUIS) 5 MG TABS tablet Take 5 mg by mouth 2 (two) times daily. 06/11/16   [provider]  aspirin 81 MG chewable tablet Chew 81 mg by mouth daily.    [provider]  atorvastatin (LIPITOR) 40 MG tablet Take 40 mg by mouth at bedtime.     [provider]  Fluticasone-Salmeterol (ADVAIR) 250-50 MCG/DOSE AEPB Inhale 1 puff into the lungs 2 (two) times daily.    [provider]  furosemide (LASIX) 80 MG tablet Take 1 tablet (80 mg total) by mouth daily. Patient taking differently:  Take 80 mg by mouth daily as needed.  05/10/16   Darylene Price A, FNP  insulin detemir (LEVEMIR) 100 UNIT/ML injection Inject 40-50 Units into the skin at bedtime.     [provider]  insulin lispro (HUMALOG) 100 UNIT/ML injection Inject 12-15 Units into the skin 3 (three) times daily with meals. Sliding scale as needed    [provider]  KLOR-CON M20 20 MEQ tablet TAKE ONE TABLET BY MOUTH TWICE DAILY Patient taking differently: Take 20 mEq by mouth 2 (two) times daily.  08/20/16   Darylene Price A, FNP  lisinopril (ZESTRIL) 30 MG tablet Take 30 mg by mouth at bedtime.     [provider]  Melatonin 5 MG TABS Take 5 mg by mouth at bedtime as needed for sleep.    [provider]  methocarbamol (ROBAXIN) 500 MG tablet 1-2 tablets qid prn muscle spasms 07/22/16   Letitia Neri L, PA-C  metoprolol tartrate (LOPRESSOR) 100 MG tablet Take 1 tablet (100 mg total) by mouth 2 (two) times daily. 07/30/18   Fritzi Mandes, MD  theophylline (THEODUR) 300 MG 12 hr tablet Take 300 mg by mouth 2 (two) times daily.     [provider]  tiotropium (SPIRIVA) 18 MCG inhalation capsule Place 18 mcg into inhaler and inhale daily.    [provider]      VITAL SIGNS:  Blood pressure (!) 155/109, pulse 76, temperature 98 F (36.7 C), temperature source Oral, resp. rate (!) 31, height 6' (1.829 m), weight 122.5 kg, SpO2 98 %.  PHYSICAL EXAMINATION:  GENERAL:  48 y.o.-year-old patient lying in the bed with BiPAP on, unresponsive.[place]  EYES: Pupils equal, round, reactive to light  No scleral icterus. Extraocular muscles intact.  HEENT: Head atraumatic, normocephalic. Oropharynx and nasopharynx clear.  NECK:  Supple, no jugular venous distention. No thyroid enlargement, no tenderness.  LUNGS:  diminished breath sounds bilaterally.  CARDIOVASCULAR: S1, S2 normal. No murmurs, rubs, or gallops.  ABDOMEN: Soft, nontender, nondistended. Bowel sounds present. No organomegaly or  mass.  EXTREMITIES: TRACE PEDAL EDEMA PRESENT  NEUROLOGIC: Unresponsive PSYCHIATRIC: UNRESPONSIVE  SKIN: No obvious rash, lesion, or ulcer.   LABORATORY PANEL:   CBC Recent Labs  Lab 11/17/18 0743  WBC 8.3  HGB 15.2  HCT 50.0  PLT 259   ------------------------------------------------------------------------------------------------------------------  Chemistries  Recent Labs  Lab 11/17/18 0743  NA 141  K 5.1  CL 98  CO2 39*  GLUCOSE 136*  BUN 16  CREATININE 1.46*  CALCIUM 8.3*  AST 53*  ALT 47*  ALKPHOS 88  BILITOT 0.7   ------------------------------------------------------------------------------------------------------------------  Cardiac Enzymes No results for input(s): TROPONINI in the last 168 hours. ------------------------------------------------------------------------------------------------------------------  RADIOLOGY:  Dg Chest Port 1 View  Result Date: 11/17/2018 CLINICAL DATA:  Shortness of breath EXAM: PORTABLE CHEST 1 VIEW COMPARISON:  Yesterday FINDINGS: Patchy bilateral pulmonary opacity with  scarring/atelectasis on both sides by 2019 chest CT. Chronic elevation of the left diaphragm. Normal heart size for portable technique. No effusion or pneumothorax. IMPRESSION: Stable scarring and left diaphragm elevation. No acute superimposed finding. Electronically Signed   By: Monte Fantasia M.D.   On: 11/17/2018 07:49   Dg Chest Portable 1 View  Result Date: 11/16/2018 CLINICAL DATA:  Shortness of breath. EXAM: PORTABLE CHEST 1 VIEW COMPARISON:  November 05, 2018. FINDINGS: Normal cardiomediastinal silhouette. No pneumothorax is noted. Stable bilateral lung scarring is noted. No definite acute abnormality is noted. Bony thorax is unremarkable. Stable elevated left hemidiaphragm IMPRESSION: Stable bilateral lung scarring is noted. No significant change compared to prior exam Electronically Signed   By: Marijo Conception M.D.   On: 11/16/2018 16:04     EKG:   Orders placed or performed during the hospital encounter of 11/17/18  . ED EKG  . ED EKG  Normal sinus rhythm at 98 bpm, history of LBBB, left anterior fascicular block.  IMPRESSION AND PLAN:   48 year old male patient with history of CAD, diastolic heart failure, COPD on chronic oxygen 3 L, hypertension, hyperlipidemia, sleep apnea comes in because of shortness of breath and found to have hypoxia, hypercapnia and admitting to stepdown for BiPAP.  His COVID-19 test is negative. #1 acute on chronic respiratory failure with hypoxia and hypercapnia secondary to COPD exacerbation, CHF exacerbation: Continue bronchodilators, IV steroids, add IV Lasix, check echocardiogram. 2.  Diabetes mellitus type 2 with long-term use of insulin, with CAD, diabetic nephropathy: Continue sliding scale insulin with coverage, when patient is more alert start the diet, start Lantus insulin. 3.  Mild hyperkalemia 4.  Malignant hypertension ; blood pressure is 163/104, use IV Lasix, PRN hydralazine. 5.  Acute on chronic renal failure, chronic kidney disease stage III.   All the records are reviewed and case discussed with ED provider. Management plans discussed with the patient, family and they are in agreement.  CODE STATUS: Full code TOTAL TIME TAKING CARE OF THIS PATIENT: 55 minutes.    Epifanio Lesches M.D on 11/17/2018 at 10:13 AM  Between 7am to 6pm - Pager - [phone]  After 6pm go to [email or web address] - password [email or web address] [facility]  Office  [phone]  CC: Primary care physician; [place]  Note: This dictation was prepared with Dragon dictation along with smaller phrase technology. Any transcriptional errors that result from this process are unintentional.

## 2018-11-17 NOTE — Consult Note (Signed)
CRITICAL CARE CONSULTATION NOTE    Name: CASHTON KUSTRA MRN: [id number] DOB: July 27, 1970     LOS: 0   SUBJECTIVE FINDINGS & SIGNIFICANT EVENTS   Patient description:  This is a pleasant 48 year old male with a history of advanced COPD with approximately 35-pack-year smoking history who has chronic hypoxemia is on 2 L nasal cannula of oxygen therapy at home as well as trilogy noninvasive ventilator nocturnally, currently being seen by Dr. [name] at [facility] pulmonary, as well as CKD and history of MI, had severe acute exacerbation of COPD last year with endotracheal intubation.  He reports that he had moderate COPD exacerbation 2 weeks ago was released from the hospital and came home feeling not quite to baseline, went out to eat with his family and started to feel worsening shortness of breath.  He was able to measure his oxygen noted hypoxemia.  He denies febrile illness flulike symptoms, myalgias, high-volume discolored phlegm production, chest pain or constitutional symptoms.   Lines / Drains: Peripheral IV x2  Cultures / Sepsis markers: Respiratory culture  Antibiotics: Levaquin 750 mg p.o. daily   Protocols / Consultants: Critical care consultation    PAST MEDICAL HISTORY   Past Medical History:  Diagnosis Date  . CHF (congestive heart failure) ([place])   . COPD (chronic obstructive pulmonary disease) ([place])   . Coronary artery disease   . Diabetes mellitus without complication ([place])   . Hypercholesteremia unk  . Hypertension   . Myocardial infarction ([place])   . Sarcoidosis   . Sleep apnea    does not use CPAP regularly.  . Stroke [facility])      SURGICAL HISTORY   Past Surgical History:  Procedure Laterality Date  . APPENDECTOMY    . PARTIAL NEPHRECTOMY       FAMILY HISTORY    Family History  Problem Relation Age of Onset  . Hypertension Mother   . Heart disease Mother   . Diabetes Mother   . Cancer Father      SOCIAL HISTORY   Social History   Tobacco Use  . Smoking status: Current Every Day Smoker    Packs/day: 0.25    Years: 25.00    Pack years: 6.25    Types: Cigarettes    Last attempt to quit: 02/05/2015    Years since quitting: 3.7  . Smokeless tobacco: Never Used  Substance Use Topics  . Alcohol use: Yes    Alcohol/week: 0.0 standard drinks    Comment: occassional  . Drug use: Yes    Types: Marijuana    Comment: last use December 2016     MEDICATIONS   Current Medication:  Current Facility-Administered Medications:  .  acetaminophen (TYLENOL) tablet 650 mg, 650 mg, Oral, Q6H PRN **OR** acetaminophen (TYLENOL) suppository 650 mg, 650 mg, Rectal, Q6H PRN, Epifanio Lesches, MD .  bisacodyl (DULCOLAX) EC tablet 5 mg, 5 mg, Oral, Daily PRN, Epifanio Lesches, MD .  chlorhexidine (PERIDEX) 0.12 % solution 15 mL, 15 mL, Mouth Rinse, BID, Ottie Glazier, MD .  Derrill Memo ON 11/18/2018] Chlorhexidine Gluconate Cloth 2 % PADS 6 each, 6 each, Topical, Daily, Fronie Holstein, MD .  docusate sodium (COLACE) capsule 100 mg, 100 mg, Oral, BID, Epifanio Lesches, MD .  enoxaparin (LOVENOX) injection 40 mg, 40 mg, Subcutaneous, Q24H, Konidena, Snehalatha, MD .  hydrALAZINE (APRESOLINE) injection 10 mg, 10 mg, Intravenous, Q6H PRN, Epifanio Lesches, MD .  HYDROcodone-acetaminophen (NORCO/VICODIN) 5-325 MG per tablet 1-2 tablet, 1-2 tablet, Oral, Q4H PRN, Epifanio Lesches,  MD .  insulin aspart (novoLOG) injection 0-9 Units, 0-9 Units, Subcutaneous, Q6H, Konidena, Snehalatha, MD .  ipratropium-albuterol (DUONEB) 0.5-2.5 (3) MG/3ML nebulizer solution 3 mL, 3 mL, Nebulization, Q4H, Epifanio Lesches, MD, 3 mL at 11/17/18 1537 .  MEDLINE mouth rinse, 15 mL, Mouth Rinse, q12n4p, Darlys Buis, MD .  methylPREDNISolone sodium succinate  (SOLU-MEDROL) 40 mg/mL injection 40 mg, 40 mg, Intravenous, Daily, Vianne Bulls, Snehalatha, MD .  ondansetron (ZOFRAN) tablet 4 mg, 4 mg, Oral, Q6H PRN **OR** ondansetron (ZOFRAN) injection 4 mg, 4 mg, Intravenous, Q6H PRN, Epifanio Lesches, MD    ALLERGIES   Penicillins    REVIEW OF SYSTEMS     10 point ROS conducted and is negative except for hunger  PHYSICAL EXAMINATION   Vital Signs: Temp:  [98 F (36.7 C)] 98 F (36.7 C) (10/12 0733) Pulse Rate:  [76-96] 82 (10/12 1145) Resp:  [18-42] 31 (10/12 1130) BP: (144-171)/(85-109) 144/99 (10/12 1145) SpO2:  [90 %-99 %] 93 % (10/12 1537) Weight:  [122.5 kg] 122.5 kg (10/12 0734)  GENERAL: Mild distress due to shortness of breath HEAD: Normocephalic, atraumatic.  EYES: Pupils equal, round, reactive to light.  No scleral icterus.  MOUTH: Moist mucosal membrane. NECK: Supple. No thyromegaly. No nodules. No JVD.  PULMONARY: Rhonchorous breath sounds bilaterally without wheezing or crackles CARDIOVASCULAR: S1 and S2. Regular rate and rhythm. No murmurs, rubs, or gallops.  GASTROINTESTINAL: Soft, nontender, non-distended. No masses. Positive bowel sounds. No hepatosplenomegaly.  MUSCULOSKELETAL: No swelling, 3+ lower extremity edema NEUROLOGIC: Mild distress due to acute illness SKIN:intact,warm,dry   PERTINENT DATA     Infusions:  Scheduled Medications: . chlorhexidine  15 mL Mouth Rinse BID  . [START ON 11/18/2018] Chlorhexidine Gluconate Cloth  6 each Topical Daily  . docusate sodium  100 mg Oral BID  . enoxaparin (LOVENOX) injection  40 mg Subcutaneous Q24H  . insulin aspart  0-9 Units Subcutaneous Q6H  . ipratropium-albuterol  3 mL Nebulization Q4H  . mouth rinse  15 mL Mouth Rinse q12n4p  . methylPREDNISolone (SOLU-MEDROL) injection  40 mg Intravenous Daily   PRN Medications: acetaminophen **OR** acetaminophen, bisacodyl, hydrALAZINE, HYDROcodone-acetaminophen, ondansetron **OR** ondansetron (ZOFRAN) IV  Hemodynamic parameters:   Intake/Output: No intake/output data recorded.  Ventilator  Settings:       LAB RESULTS:  Basic Metabolic Panel: Recent Labs  Lab 11/16/18 1450 11/17/18 0743  NA 142 141  K 4.2 5.1  CL 97* 98  CO2 36* 39*  GLUCOSE 179* 136*  BUN 14 16  CREATININE 1.31* 1.46*  CALCIUM 8.7* 8.3*   Liver Function Tests: Recent Labs  Lab 11/16/18 1450 11/17/18 0743  AST 20 53*  ALT 22 47*  ALKPHOS 78 88  BILITOT 0.6 0.7  PROT 6.8 7.0  ALBUMIN 3.5 3.6   No results for input(s): LIPASE, AMYLASE in the last 168 hours. No results for input(s): AMMONIA in the last 168 hours. CBC: Recent Labs  Lab 11/16/18 1450 11/17/18 0743  WBC 6.4 8.3  NEUTROABS 4.4 5.4  HGB 14.5 15.2  HCT 47.1 50.0  MCV 89.0 90.7  PLT 198 259   Cardiac Enzymes: No results for input(s): CKTOTAL, CKMB, CKMBINDEX, TROPONINI in the last 168 hours. BNP: Invalid input(s): POCBNP CBG: Recent Labs  Lab 11/17/18 1308  GLUCAP 195*     IMAGING RESULTS:  Imaging: Dg Chest Port 1 View  Result Date: 11/17/2018 CLINICAL DATA:  Shortness of breath EXAM: PORTABLE CHEST 1 VIEW COMPARISON:  Yesterday FINDINGS: Patchy bilateral pulmonary opacity with scarring/atelectasis on both  sides by 2019 chest CT. Chronic elevation of the left diaphragm. Normal heart size for portable technique. No effusion or pneumothorax. IMPRESSION: Stable scarring and left diaphragm elevation. No acute superimposed finding. Electronically Signed   By: Monte Fantasia M.D.   On: 11/17/2018 07:49   Dg Chest Portable 1 View  Result Date: 11/16/2018 CLINICAL DATA:  Shortness of breath. EXAM: PORTABLE CHEST 1 VIEW COMPARISON:  November 05, 2018. FINDINGS: Normal cardiomediastinal silhouette. No pneumothorax is noted. Stable bilateral lung scarring is noted. No definite acute abnormality is noted. Bony thorax is unremarkable. Stable elevated left hemidiaphragm IMPRESSION: Stable bilateral lung scarring is noted. No  significant change compared to prior exam Electronically Signed   By: Marijo Conception M.D.   On: 11/16/2018 16:04      ASSESSMENT AND PLAN    -Multidisciplinary rounds held today  Acute on chronic hypoxic and hypercapnic respiratory Failure -Due to acute exacerbation of COPD -continue Bronchodilator Therapy -Continue BiPAP -Solu-Medrol 40 twice daily- Levaquin 750 p.o. daily  Acute on chronic chronic diastolic CHF EF greater than 60% -Lasix 40 mg IV twice daily -Patient with increased JVD as well as 3+ lower extremity edema and bilateral interstitial opacification consistent with pulmonary edema -Mandatory BiPAP nocturnally-if patient does not wear BiPAP please stop diuresis right away ICU monitoring -Limit fluid intake to 1200 cc/day   Obstructive sleep apnea with OHS overlap   -Continue BiPAP with O2 bleed-in   Renal Failure-acute on chronic stage II -Avoid hypotension -DC nonessential nephrotoxins -follow chem 7 -follow UO -continue Foley Catheter-assess need daily   GI/Nutrition GI PROPHYLAXIS as indicated DIET-->TF's as tolerated Constipation protocol as indicated  ENDO - ICU hypoglycemic\Hyperglycemia protocol -check FSBS per protocol   ELECTROLYTES -follow labs as needed -replace as needed -pharmacy consultation   DVT/GI PRX ordered -SCDs  TRANSFUSIONS AS NEEDED MONITOR FSBS ASSESS the need for LABS as needed   Critical care provider statement:    Critical care time (minutes):  108   Critical care time was exclusive of:  Separately billable procedures and treating other patients   Critical care was necessary to treat or prevent imminent or life-threatening deterioration of the following conditions:   Acute on chronic hypercapnic hypoxemic respiratory failure, acute on chronic diastolic CHF, acute on chronic renal insufficiency, multiple comorbid conditions   Critical care was time spent personally by me on the following activities:  Development of  treatment plan with patient or surrogate, discussions with consultants, evaluation of patient's response to treatment, examination of patient, obtaining history from patient or surrogate, ordering and performing treatments and interventions, ordering and review of laboratory studies and re-evaluation of patient's condition.  I assumed direction of critical care for this patient from another provider in my specialty: no    This document was prepared using Dragon voice recognition software and may include unintentional dictation errors.    Ottie Glazier, M.D.  Division of [place]

## 2018-11-17 NOTE — ED Triage Notes (Signed)
Pt here via EMS from home with c/o worsening shob with exertion. Was here for the shob yesterday, upon ED d/c was feeling a little better and agreed d/c was appropriate at that time. EMS report included 70's on 3L humidified O2 upon their initial arrival, is on home O2 at 2L continuously. No distress upon ED arrival noted, however, pt quick to fall asleep between questions, states "I can breathe this minute but when I move, I will get very SHOB." Sats 99% on NRB.

## 2018-11-18 LAB — BASIC METABOLIC PANEL
Anion gap: 11 (ref 5–15)
BUN: 21 mg/dL — ABNORMAL HIGH (ref 6–20)
CO2: 36 mmol/L — ABNORMAL HIGH (ref 22–32)
Calcium: 8.6 mg/dL — ABNORMAL LOW (ref 8.9–10.3)
Chloride: 94 mmol/L — ABNORMAL LOW (ref 98–111)
Creatinine, Ser: 1.47 mg/dL — ABNORMAL HIGH (ref 0.61–1.24)
GFR calc Af Amer: >60 mL/min (ref >60)
GFR calc non Af Amer: 56 mL/min — ABNORMAL LOW (ref >60)
Glucose, Bld: 192 mg/dL — ABNORMAL HIGH (ref 70–99)
Potassium: 5 mmol/L (ref 3.5–5.1)
Sodium: 141 mmol/L (ref 135–145)

## 2018-11-18 LAB — BLOOD GAS, ARTERIAL
Acid-Base Excess: 12.2 mmol/L — ABNORMAL HIGH (ref 0.0–2.0)
Bicarbonate: 43.9 mmol/L — ABNORMAL HIGH (ref 20.0–28.0)
FIO2: 0.28
O2 Saturation: 92.7 %
pCO2 arterial: 100 mmHg (ref 32.0–48.0)
pH, Arterial: 7.25 — ABNORMAL LOW (ref 7.350–7.450)
pO2, Arterial: 65 mmHg — ABNORMAL LOW (ref 83.0–108.0)

## 2018-11-18 LAB — CBC
HCT: 49.7 % (ref 39.0–52.0)
Hemoglobin: 15.2 g/dL (ref 13.0–17.0)
MCH: 27.4 pg (ref 26.0–34.0)
MCHC: 30.6 g/dL (ref 30.0–36.0)
MCV: 89.5 fL (ref 80.0–100.0)
Platelets: 226 10*3/uL (ref 150–400)
RBC: 5.55 MIL/uL (ref 4.22–5.81)
RDW: 12.3 % (ref 11.5–15.5)
WBC: 11.7 10*3/uL — ABNORMAL HIGH (ref 4.0–10.5)
nRBC: 0 % (ref 0.0–0.2)

## 2018-11-18 LAB — GLUCOSE, CAPILLARY
Glucose-Capillary: 162 mg/dL — ABNORMAL HIGH (ref 70–99)
Glucose-Capillary: 127 mg/dL — ABNORMAL HIGH (ref 70–99)
Glucose-Capillary: 164 mg/dL — ABNORMAL HIGH (ref 70–99)
Glucose-Capillary: 205 mg/dL — ABNORMAL HIGH (ref 70–99)
Glucose-Capillary: 215 mg/dL — ABNORMAL HIGH (ref 70–99)

## 2018-11-18 MED ORDER — TIOTROPIUM BROMIDE MONOHYDRATE 18 MCG IN CAPS
18.0000 ug | ORAL_CAPSULE | Freq: Every day | RESPIRATORY_TRACT | Status: DC
  Administered 2018-11-18 – 2018-11-21 (×4): 18 ug via RESPIRATORY_TRACT
  Filled 2018-11-18: qty 5

## 2018-11-18 MED ORDER — LISINOPRIL 20 MG PO TABS
30.0000 mg | ORAL_TABLET | Freq: Every day | ORAL | Status: DC
  Administered 2018-11-18 – 2018-11-20 (×3): 30 mg via ORAL
  Filled 2018-11-18 (×4): qty 1

## 2018-11-18 MED ORDER — MELATONIN 5 MG PO TABS
5.0000 mg | ORAL_TABLET | Freq: Every evening | ORAL | Status: DC | PRN
  Administered 2018-11-18 (×2): 5 mg via ORAL
  Filled 2018-11-18 (×3): qty 1

## 2018-11-18 MED ORDER — APIXABAN 5 MG PO TABS
5.0000 mg | ORAL_TABLET | Freq: Two times a day (BID) | ORAL | Status: DC
  Administered 2018-11-19 – 2018-11-21 (×5): 5 mg via ORAL
  Filled 2018-11-18 (×5): qty 1

## 2018-11-18 MED ORDER — LISINOPRIL 5 MG PO TABS: 30.0000 mg | ORAL_TABLET | Freq: Every day | ORAL | Status: DC

## 2018-11-18 MED ORDER — ASPIRIN 81 MG PO CHEW
81.0000 mg | CHEWABLE_TABLET | Freq: Every day | ORAL | Status: DC
  Administered 2018-11-18 – 2018-11-21 (×4): 81 mg via ORAL
  Filled 2018-11-18 (×4): qty 1

## 2018-11-18 MED ORDER — METOPROLOL TARTRATE 50 MG PO TABS
100.0000 mg | ORAL_TABLET | Freq: Two times a day (BID) | ORAL | Status: DC
  Administered 2018-11-18 – 2018-11-21 (×8): 100 mg via ORAL
  Filled 2018-11-18 (×7): qty 2

## 2018-11-18 MED ORDER — FLUTICASONE FUROATE-VILANTEROL 200-25 MCG/INH IN AEPB
1.0000 | INHALATION_SPRAY | Freq: Every day | RESPIRATORY_TRACT | Status: DC
  Administered 2018-11-18 – 2018-11-21 (×4): 1 via RESPIRATORY_TRACT
  Filled 2018-11-18 (×2): qty 28

## 2018-11-18 MED ORDER — ATORVASTATIN CALCIUM 20 MG PO TABS
40.0000 mg | ORAL_TABLET | Freq: Every day | ORAL | Status: DC
  Administered 2018-11-18 – 2018-11-20 (×3): 40 mg via ORAL
  Filled 2018-11-18 (×3): qty 2

## 2018-11-18 MED ORDER — THEOPHYLLINE ER 300 MG PO TB12
300.0000 mg | ORAL_TABLET | Freq: Two times a day (BID) | ORAL | Status: DC
  Administered 2018-11-18 – 2018-11-21 (×7): 300 mg via ORAL
  Filled 2018-11-18 (×9): qty 1

## 2018-11-18 MED ORDER — ALBUTEROL SULFATE (2.5 MG/3ML) 0.083% IN NEBU: 2.5000 mg | INHALATION_SOLUTION | Freq: Four times a day (QID) | RESPIRATORY_TRACT | Status: DC | PRN

## 2018-11-18 NOTE — Progress Notes (Signed)
[name] at [place] NAME: [name]    MR#:  [id number]  DATE OF BIRTH:  1970/12/28  SUBJECTIVE: Admitted yesterday because of hypercapnic respiratory failure, lethargic yesterday admitted to stepdown, seen this morning, alert, awake, oriented, transferred out of ICU last night, says that he got to the room at 2 AM and he says he feels very sleepy and tired.  Repeat ABG shows PCO2 is 100.  CHIEF COMPLAINT:   Chief Complaint  Patient presents with  . Shortness of Breath  Alert, awake, oriented, says that he still has some shortness of breath  REVIEW OF SYSTEMS:   ROS CONSTITUTIONAL: No fever, fatigue or weakness.  EYES: No blurred or double vision.  EARS, NOSE, AND THROAT: No tinnitus or ear pain.  RESPIRATORY: MILD SHORTNESS OF BREATH CARDIOVASCULAR: No chest pain, orthopnea, edema.  GASTROINTESTINAL: No nausea, vomiting, diarrhea or abdominal pain.  GENITOURINARY: No dysuria, hematuria.  ENDOCRINE: No polyuria, nocturia,  HEMATOLOGY: No anemia, easy bruising or bleeding SKIN: No rash or lesion. MUSCULOSKELETAL: No joint pain or arthritis.   NEUROLOGIC: No tingling, numbness, weakness.  PSYCHIATRY: No anxiety or depression.   DRUG ALLERGIES:   Allergies  Allergen Reactions  . Penicillins Anaphylaxis and Other (See Comments)    Has patient had a PCN reaction causing immediate rash, facial/tongue/throat swelling, SOB or lightheadedness with hypotension: Yes Has patient had a PCN reaction causing severe rash involving mucus membranes or skin necrosis: No Has patient had a PCN reaction that required hospitalization: No Has patient had a PCN reaction occurring within the last 10 years: No If all of the above answers are "NO", then may proceed with Cephalosporin use.     VITALS:  Blood pressure (!) 127/94, pulse 80, temperature 98.2 F (36.8 C), temperature source Oral, resp. rate 20, height 6' (1.829 m), weight 122.5 kg,  SpO2 96 %.  PHYSICAL EXAMINATION:  GENERAL:  48 y.o.-year-old patient lying in the bed with no acute distress.  EYES: Pupils equal, round, reactive to light and accommodation. No scleral icterus. Extraocular muscles intact.  HEENT: Head atraumatic, normocephalic. Oropharynx and nasopharynx clear.  NECK:  Supple, no jugular venous distention. No thyroid enlargement, no tenderness.  LUNGS: N diminished air entry bilaterally. CARDIOVASCULAR: S1, S2 normal. No murmurs, rubs, or gallops.  ABDOMEN: Soft, nontender, nondistended. Bowel sounds present. No organomegaly or mass.  EXTREMITIES: No pedal edema, cyanosis, or clubbing.  NEUROLOGIC: Cranial nerves II through XII are intact. Muscle strength 5/5 in all extremities. Sensation intact. Gait not checked.  PSYCHIATRIC: The patient is alert and oriented x 3.  SKIN: No obvious rash, lesion, or ulcer.    LABORATORY PANEL:   CBC Recent Labs  Lab 11/18/18 0601  WBC 11.7*  HGB 15.2  HCT 49.7  PLT 226   ------------------------------------------------------------------------------------------------------------------  Chemistries  Recent Labs  Lab 11/17/18 0743 11/18/18 0601  NA 141 141  K 5.1 5.0  CL 98 94*  CO2 39* 36*  GLUCOSE 136* 192*  BUN 16 21*  CREATININE 1.46* 1.47*  CALCIUM 8.3* 8.6*  AST 53*  --   ALT 47*  --   ALKPHOS 88  --   BILITOT 0.7  --    ------------------------------------------------------------------------------------------------------------------  Cardiac Enzymes No results for input(s): TROPONINI in the last 168 hours. ------------------------------------------------------------------------------------------------------------------  RADIOLOGY:  Dg Chest Port 1 View  Result Date: 11/17/2018 CLINICAL DATA:  Shortness of breath EXAM: PORTABLE CHEST 1 VIEW COMPARISON:  Yesterday FINDINGS: Patchy bilateral pulmonary opacity with  scarring/atelectasis on both sides by 2019 chest CT. Chronic elevation of  the left diaphragm. Normal heart size for portable technique. No effusion or pneumothorax. IMPRESSION: Stable scarring and left diaphragm elevation. No acute superimposed finding. Electronically Signed   By: Monte Fantasia M.D.   On: 11/17/2018 07:49   Dg Chest Portable 1 View  Result Date: 11/16/2018 CLINICAL DATA:  Shortness of breath. EXAM: PORTABLE CHEST 1 VIEW COMPARISON:  November 05, 2018. FINDINGS: Normal cardiomediastinal silhouette. No pneumothorax is noted. Stable bilateral lung scarring is noted. No definite acute abnormality is noted. Bony thorax is unremarkable. Stable elevated left hemidiaphragm IMPRESSION: Stable bilateral lung scarring is noted. No significant change compared to prior exam Electronically Signed   By: Marijo Conception M.D.   On: 11/16/2018 16:04    EKG:   Orders placed or performed during the hospital encounter of 11/17/18  . ED EKG  . ED EKG    ASSESSMENT AND PLAN:   48 year old obese male with history of chronic respiratory failure on 3 L of oxygen, trilogy at home, history of essential hypertension chronic kidney disease stage III comes in because of shortness of breath./Lethargy  Acute on chronic respiratory failure with hypoxemia secondary to acute on chronic systolic heart failure, COPD exacerbation.  Improved with BiPAP support, transfer out of ICU, lethargy proved, alert, awake, oriented. #2 chronic systolic heart failure, estimated EF 45 to 50%, Continue IV diuretics for another 24 hours and transition to oral Lasix, may "have to go home on higher dose of Lasix this time.  #3 diabetes mellitus type 2 with the long-term use of insulin without complications, continue home dose insulin. 4.  History of COPD with chronic respiratory failure on 3 L of oxygen at home, has sleep apnea, uses trilogy and follows up with Dr. [name]. #4 history of CAD, patient is on Imdur, atorvastatin, aspirin. 5. History of stroke, chronic atrial fibrillation patient is on  Eliquis. 6.  Pulmonary sarcoidosis, follows up with Dr. [name]. Chronic hypercapnia' All the records are reviewed and case discussed with Care Management/Social Workerr. Management plans discussed with the patient, family and they are in agreement.  CODE STATUS:full  TOTAL TIME TAKING CARE OF THIS PATIENT: 35 minutes.   POSSIBLE D/C IN 1-2 DAYS, DEPENDING ON CLINICAL CONDITION.   Epifanio Lesches M.D on 11/18/2018 at 12:46 PM  Between 7am to 6pm - Pager - [phone]  After 6pm go to [email or web address] - password [email or web address] [facility]  Office  [phone]  CC: Primary care physician; [place]   Note: This dictation was prepared with Dragon dictation along with smaller phrase technology. Any transcriptional errors that result from this process are unintentional.

## 2018-11-18 NOTE — Progress Notes (Signed)
Was in patients room he said he did not feel good and he said he knows he CO2 is high and he is lying down with bipap on right now. Messaged Dr. Vianne Bulls she said he has chronic retention to let him sleep with the BiPap on. Will continue to monitor.

## 2018-11-19 LAB — GLUCOSE, CAPILLARY
Glucose-Capillary: 161 mg/dL — ABNORMAL HIGH (ref 70–99)
Glucose-Capillary: 171 mg/dL — ABNORMAL HIGH (ref 70–99)
Glucose-Capillary: 188 mg/dL — ABNORMAL HIGH (ref 70–99)
Glucose-Capillary: 256 mg/dL — ABNORMAL HIGH (ref 70–99)
Glucose-Capillary: 258 mg/dL — ABNORMAL HIGH (ref 70–99)
Glucose-Capillary: 351 mg/dL — ABNORMAL HIGH (ref 70–99)

## 2018-11-19 MED ORDER — INSULIN DETEMIR 100 UNIT/ML ~~LOC~~ SOLN
8.0000 [IU] | Freq: Every day | SUBCUTANEOUS | Status: DC
  Filled 2018-11-19: qty 0.08

## 2018-11-19 MED ORDER — INSULIN ASPART 100 UNIT/ML ~~LOC~~ SOLN
0.0000 [IU] | Freq: Every day | SUBCUTANEOUS | Status: DC
  Administered 2018-11-19: 5 [IU] via SUBCUTANEOUS
  Administered 2018-11-20: 2 [IU] via SUBCUTANEOUS
  Filled 2018-11-19 (×2): qty 1

## 2018-11-19 MED ORDER — FUROSEMIDE 10 MG/ML IJ SOLN
60.0000 mg | Freq: Two times a day (BID) | INTRAMUSCULAR | Status: DC
  Administered 2018-11-19 – 2018-11-21 (×4): 60 mg via INTRAVENOUS
  Filled 2018-11-19 (×4): qty 6

## 2018-11-19 MED ORDER — INSULIN ASPART 100 UNIT/ML ~~LOC~~ SOLN
0.0000 [IU] | Freq: Three times a day (TID) | SUBCUTANEOUS | Status: DC
  Administered 2018-11-19: 17:00:00 2 [IU] via SUBCUTANEOUS
  Administered 2018-11-19 – 2018-11-20 (×2): 5 [IU] via SUBCUTANEOUS
  Administered 2018-11-20: 9 [IU] via SUBCUTANEOUS
  Administered 2018-11-20: 18:00:00 7 [IU] via SUBCUTANEOUS
  Administered 2018-11-21 (×2): 3 [IU] via SUBCUTANEOUS
  Filled 2018-11-19 (×7): qty 1

## 2018-11-19 MED ORDER — INSULIN DETEMIR 100 UNIT/ML ~~LOC~~ SOLN
10.0000 [IU] | Freq: Every day | SUBCUTANEOUS | Status: DC
  Administered 2018-11-19: 21:00:00 10 [IU] via SUBCUTANEOUS
  Filled 2018-11-19 (×2): qty 0.1

## 2018-11-19 NOTE — Progress Notes (Signed)
[facility] [place] NAME: [name]    MR#:  [id number]  DATE OF BIRTH:  1970-03-19  SUBJECTIVe; feeling better than before, has shortness of breath on and off, wondering if he can check CO2 at home.  CHIEF COMPLAINT:   Chief Complaint  Patient presents with  . Shortness of Breath  Alert, awake, oriented, says that he still has some shortness of breath  REVIEW OF SYSTEMS:   ROS CONSTITUTIONAL: No fever, fatigue or weakness.  EYES: No blurred or double vision.  EARS, NOSE, AND THROAT: No tinnitus or ear pain.  RESPIRATORY: MILD SHORTNESS OF BREATH CARDIOVASCULAR: No chest pain, orthopnea, edema.  GASTROINTESTINAL: No nausea, vomiting, diarrhea or abdominal pain.  GENITOURINARY: No dysuria, hematuria.  ENDOCRINE: No polyuria, nocturia,  HEMATOLOGY: No anemia, easy bruising or bleeding SKIN: No rash or lesion. MUSCULOSKELETAL: No joint pain or arthritis.   NEUROLOGIC: No tingling, numbness, weakness.  PSYCHIATRY: No anxiety or depression.   DRUG ALLERGIES:   Allergies  Allergen Reactions  . Penicillins Anaphylaxis and Other (See Comments)    Has patient had a PCN reaction causing immediate rash, facial/tongue/throat swelling, SOB or lightheadedness with hypotension: Yes Has patient had a PCN reaction causing severe rash involving mucus membranes or skin necrosis: No Has patient had a PCN reaction that required hospitalization: No Has patient had a PCN reaction occurring within the last 10 years: No If all of the above answers are "NO", then may proceed with Cephalosporin use.     VITALS:  Blood pressure 103/66, pulse 76, temperature 97.6 F (36.4 C), temperature source Axillary, resp. rate 18, height 6' (1.829 m), weight 122.5 kg, SpO2 100 %.  PHYSICAL EXAMINATION:  GENERAL:  48 y.o.-year-old patient lying in the bed with no acute distress.  EYES: Pupils equal, round, reactive to light and accommodation. No scleral  icterus. Extraocular muscles intact.  HEENT: Head atraumatic, normocephalic. Oropharynx and nasopharynx clear.  NECK:  Supple, no jugular venous distention. No thyroid enlargement, no tenderness.  LUNGS: N diminished air entry bilaterally.  No wheeze or rales. CARDIOVASCULAR: S1, S2 normal. No murmurs, rubs, or gallops.  ABDOMEN: Soft, nontender, nondistended. Bowel sounds present. No organomegaly or mass.  EXTREMITIES: No pedal edema, cyanosis, or clubbing.  NEUROLOGIC: Cranial nerves II through XII are intact. Muscle strength 5/5 in all extremities. Sensation intact. Gait not checked.  PSYCHIATRIC: The patient is alert and oriented x 3.  SKIN: No obvious rash, lesion, or ulcer.    LABORATORY PANEL:   CBC Recent Labs  Lab 11/18/18 0601  WBC 11.7*  HGB 15.2  HCT 49.7  PLT 226   ------------------------------------------------------------------------------------------------------------------  Chemistries  Recent Labs  Lab 11/17/18 0743 11/18/18 0601  NA 141 141  K 5.1 5.0  CL 98 94*  CO2 39* 36*  GLUCOSE 136* 192*  BUN 16 21*  CREATININE 1.46* 1.47*  CALCIUM 8.3* 8.6*  AST 53*  --   ALT 47*  --   ALKPHOS 88  --   BILITOT 0.7  --    ------------------------------------------------------------------------------------------------------------------  Cardiac Enzymes No results for input(s): TROPONINI in the last 168 hours. ------------------------------------------------------------------------------------------------------------------  RADIOLOGY:  No results found.  EKG:   Orders placed or performed during the hospital encounter of 11/17/18  . ED EKG  . ED EKG    ASSESSMENT AND PLAN:   48 year old obese male with history of chronic respiratory failure on 3 L of oxygen, trilogy at home, history of essential hypertension chronic kidney  disease stage III comes in because of shortness of breath./Lethargy  Acute on chronic respiratory failure with hypoxemia  secondary to acute on chronic systolic heart failure, COPD exacerbation.  Improved with BiPAP support, transfer out of ICU, on chronic oxygen 3 L at home, has trilogy at home  Continue IV diuretics for now #2 chronic systolic heart failure, estimated EF 45 to 50%, C continue IV Lasix, patient states that he takes 80 mg of Lasix at home and told him that he can take 60 mg twice daily for another 3- 4 days after discharge and then resume home dose when ready for discharge.  Continue IV diuretics as patient still feels short of breath.  #  3 diabetes mellitus type 2 with the long-term use of insulin without complications, continue home dose insulin. 4.  History of COPD with chronic respiratory failure on 3 L of oxygen at home, has sleep apnea, uses trilogy and follows up with Dr. [name]. #4 history of CAD, patient is on Imdur, atorvastatin, aspirin. 5. History of stroke, chronic atrial fibrillation patient is on Eliquis. 6.  Pulmonary sarcoidosis, follows up with Dr. [name]. Chronic hypercapnia' All the records are reviewed and case discussed with Care Management/Social Workerr. Management plans discussed with the patient, family and they are in agreement.  CODE STATUS:full More than 50% time spent in counseling, coordination of care TOTAL TIME TAKING CARE OF THIS PATIENT: 35 minutes.   POSSIBLE D/C IN 1-2 DAYS, DEPENDING ON CLINICAL CONDITION.   Epifanio Lesches M.D on 11/19/2018 at 1:18 PM  Between 7am to 6pm - Pager - [phone]  After 6pm go to [email or web address] - password [email or web address] [place] [facility]  Office  [phone]  CC: Primary care physician; [place]   Note: This dictation was prepared with Dragon dictation along with smaller phrase technology. Any transcriptional errors that result from this process are unintentional.

## 2018-11-19 NOTE — TOC Initial Note (Signed)
Transition of Care [facility]) - Initial/Assessment Note    Patient Details  Name: [name] MRN: [id number] Date of Birth: 1970/12/16  Transition [facility]) CM/SW Contact:    Shelbie Hutching, RN Phone Number: 11/19/2018, 10:10 AM  Clinical Narrative:                 Patient admitted with acute on chronic respiratory failure with hypoxia, patient has a history of COPD on chronic O2 at 2L.  Patient gets his O2 from [employer], patient also has a Trilogy that he uses at home and a nebulizer.  Patient is from home and lives with his wife.  Patient is independent in all ADL's and drives.  Patient is current with PCP at [facility] and sees Dr. Vella Kohler for pulmonology.  Patient does not want home health at this time.   RNCM will cont to follow and assess for any additional discharge needs.   Expected Discharge Plan: Home/Self Care Barriers to Discharge: Continued Medical Work up   Patient Goals and CMS Choice Patient states their goals for this hospitalization and ongoing recovery are:: wants to go home      Expected Discharge Plan and Services Expected Discharge Plan: Home/Self Care   Discharge Planning Services: CM Consult   Living arrangements for the past 2 months: Single Family Home                                      Prior Living Arrangements/Services Living arrangements for the past 2 months: Single Family Home Lives with:: Spouse Patient language and need for interpreter reviewed:: No Do you feel safe going back to the place where you live?: Yes      Need for Family Participation in Patient Care: Yes (Comment)(COPD) Care giver support system in place?: Yes (comment)(Wife) Current home services: DME(oxygen, trilogy) Criminal Activity/Legal Involvement Pertinent to Current Situation/Hospitalization: No - Comment as needed  Activities of Daily Living Home Assistive Devices/Equipment: BIPAP, Oxygen, Nebulizer ADL Screening (condition at time of  admission) Patient's cognitive ability adequate to safely complete daily activities?: Yes Is the patient deaf or have difficulty hearing?: No Does the patient have difficulty seeing, even when wearing glasses/contacts?: No Does the patient have difficulty concentrating, remembering, or making decisions?: No Patient able to express need for assistance with ADLs?: Yes Does the patient have difficulty dressing or bathing?: No Independently performs ADLs?: Yes (appropriate for developmental age) Does the patient have difficulty walking or climbing stairs?: No Weakness of Legs: None Weakness of Arms/Hands: None  Permission Sought/Granted Permission sought to share information with : Case Manager Permission granted to share information with : Yes, Verbal Permission Granted              Emotional Assessment Appearance:: Appears stated age Attitude/Demeanor/Rapport: Engaged Affect (typically observed): Accepting Orientation: : Oriented to Self, Oriented to Place, Oriented to  Time, Oriented to Situation Alcohol / Substance Use: Not Applicable Psych Involvement: No (comment)  Admission diagnosis:  SOB (shortness of breath) [R06.02] Acute respiratory failure with hypoxia and hypercarbia (HCC) [J96.01, J96.02] Patient Active Problem List   Diagnosis Date Noted  . Acute on chronic respiratory failure with hypoxia and hypercapnia ([place]) 11/17/2018  . Acute exacerbation of CHF (congestive heart failure) ([place]) 11/07/2018  . Chest pain 11/06/2018  . Acute exacerbation of chronic obstructive pulmonary disease (COPD) ([place]) 07/29/2018  . COPD with acute exacerbation ([place]) 02/03/2018  .  COPD (chronic obstructive pulmonary disease) ([place]) 07/16/2016  . Chronic diastolic heart failure ([place]) 03/23/2016  . Hypercarbia   . COPD exacerbation ([place]) 11/17/2015  . HTN (hypertension) 06/14/2015  . Dental caries 03/08/2015  . Morbid obesity ([place]) 02/21/2015  . OSA and COPD overlap syndrome ([place])  02/21/2015  . Thrombocytopenia ([place]) 02/21/2015  . Hypernatremia 02/21/2015  . Diabetes ([place]) 10/19/2014  . Chronic combined systolic and diastolic congestive heart failure ([place]) 10/18/2014  . Chronic obstructive pulmonary disease ([place]) 10/18/2014  . Sarcoidosis 04/22/2013  . Other and unspecified hyperlipidemia 04/22/2013   PCP:  [place]:   [facility] [facility] [address]), [place] - [place] ([place]) [place] [address] Phone: [phone] Fax: [phone]     Social Determinants of Health (SDOH) Interventions    Readmission Risk Interventions Readmission Risk Prevention Plan 07/30/2018 07/29/2018  Transportation Screening Complete Complete  PCP or Specialist Appt within 3-5 Days Complete Complete  HRI or [place] Complete Complete  Social Work Consult for [place] Planning/Counseling - Patient refused  Palliative Care Screening Not Applicable Not Applicable  Medication Review Press photographer) Complete Complete  Some recent data might be hidden

## 2018-11-20 LAB — BASIC METABOLIC PANEL
Anion gap: 8 (ref 5–15)
BUN: 29 mg/dL — ABNORMAL HIGH (ref 6–20)
CO2: 41 mmol/L — ABNORMAL HIGH (ref 22–32)
Calcium: 8.6 mg/dL — ABNORMAL LOW (ref 8.9–10.3)
Chloride: 93 mmol/L — ABNORMAL LOW (ref 98–111)
Creatinine, Ser: 1.52 mg/dL — ABNORMAL HIGH (ref 0.61–1.24)
GFR calc Af Amer: >60 mL/min (ref >60)
GFR calc non Af Amer: 53 mL/min — ABNORMAL LOW (ref >60)
Glucose, Bld: 229 mg/dL — ABNORMAL HIGH (ref 70–99)
Potassium: 4.4 mmol/L (ref 3.5–5.1)
Sodium: 142 mmol/L (ref 135–145)

## 2018-11-20 LAB — GLUCOSE, CAPILLARY
Glucose-Capillary: 253 mg/dL — ABNORMAL HIGH (ref 70–99)
Glucose-Capillary: 351 mg/dL — ABNORMAL HIGH (ref 70–99)
Glucose-Capillary: 343 mg/dL — ABNORMAL HIGH (ref 70–99)
Glucose-Capillary: 241 mg/dL — ABNORMAL HIGH (ref 70–99)

## 2018-11-20 MED ORDER — INSULIN ASPART 100 UNIT/ML ~~LOC~~ SOLN
3.0000 [IU] | Freq: Three times a day (TID) | SUBCUTANEOUS | Status: DC
  Administered 2018-11-20 – 2018-11-21 (×3): 3 [IU] via SUBCUTANEOUS
  Filled 2018-11-20 (×3): qty 1

## 2018-11-20 MED ORDER — INSULIN DETEMIR 100 UNIT/ML ~~LOC~~ SOLN
13.0000 [IU] | Freq: Every day | SUBCUTANEOUS | Status: DC
  Administered 2018-11-20: 22:00:00 13 [IU] via SUBCUTANEOUS
  Filled 2018-11-20 (×2): qty 0.13

## 2018-11-20 NOTE — Progress Notes (Signed)
[place] [facility] [place] NAME: [name]    MR#:  [id number]  DATE OF BIRTH:  02-27-1970  SUBJECTIVe;  less shortness of breath..  CHIEF COMPLAINT:   Chief Complaint  Patient presents with  . Shortness of Breath  Alert, awake, oriented.  REVIEW OF SYSTEMS:   ROS CONSTITUTIONAL: No fever, fatigue or weakness.  EYES: No blurred or double vision.  EARS, NOSE, AND THROAT: No tinnitus or ear pain.  RESPIRATORY: MILD SHORTNESS OF BREATH CARDIOVASCULAR: No chest pain, orthopnea, edema.  GASTROINTESTINAL: No nausea, vomiting, diarrhea or abdominal pain.  GENITOURINARY: No dysuria, hematuria.  ENDOCRINE: No polyuria, nocturia,  HEMATOLOGY: No anemia, easy bruising or bleeding SKIN: No rash or lesion. MUSCULOSKELETAL: No joint pain or arthritis.   NEUROLOGIC: No tingling, numbness, weakness.  PSYCHIATRY: No anxiety or depression.   DRUG ALLERGIES:   Allergies  Allergen Reactions  . Penicillins Anaphylaxis and Other (See Comments)    Has patient had a PCN reaction causing immediate rash, facial/tongue/throat swelling, SOB or lightheadedness with hypotension: Yes Has patient had a PCN reaction causing severe rash involving mucus membranes or skin necrosis: No Has patient had a PCN reaction that required hospitalization: No Has patient had a PCN reaction occurring within the last 10 years: No If all of the above answers are "NO", then may proceed with Cephalosporin use.     VITALS:  Blood pressure 116/84, pulse 72, temperature (!) 97.5 F (36.4 C), temperature source Oral, resp. rate 19, height 6' (1.829 m), weight 122.5 kg, SpO2 100 %.  PHYSICAL EXAMINATION:  GENERAL:  48 y.o.-year-old patient lying in the bed with no acute distress.  EYES: Pupils equal, round, reactive to light and accommodation. No scleral icterus. Extraocular muscles intact.  HEENT: Head atraumatic, normocephalic. Oropharynx and nasopharynx clear.  NECK:   Supple, no jugular venous distention. No thyroid enlargement, no tenderness.  LUNGS: N diminished air entry bilaterally.  No wheeze or rales. CARDIOVASCULAR: S1, S2 normal. No murmurs, rubs, or gallops.  ABDOMEN: Soft, nontender, nondistended. Bowel sounds present. No organomegaly or mass.  EXTREMITIES: No pedal edema, cyanosis, or clubbing.  NEUROLOGIC: Cranial nerves II through XII are intact. Muscle strength 5/5 in all extremities. Sensation intact. Gait not checked.  PSYCHIATRIC: The patient is alert and oriented x 3.  SKIN: No obvious rash, lesion, or ulcer.    LABORATORY PANEL:   CBC Recent Labs  Lab 11/18/18 0601  WBC 11.7*  HGB 15.2  HCT 49.7  PLT 226   ------------------------------------------------------------------------------------------------------------------  Chemistries  Recent Labs  Lab 11/17/18 0743  11/20/18 0431  NA 141   < > 142  K 5.1   < > 4.4  CL 98   < > 93*  CO2 39*   < > 41*  GLUCOSE 136*   < > 229*  BUN 16   < > 29*  CREATININE 1.46*   < > 1.52*  CALCIUM 8.3*   < > 8.6*  AST 53*  --   --   ALT 47*  --   --   ALKPHOS 88  --   --   BILITOT 0.7  --   --    < > = values in this interval not displayed.   ------------------------------------------------------------------------------------------------------------------  Cardiac Enzymes No results for input(s): TROPONINI in the last 168 hours. ------------------------------------------------------------------------------------------------------------------  RADIOLOGY:  No results found.  EKG:   Orders placed or performed during the hospital encounter of 11/17/18  . ED EKG  .  ED EKG    ASSESSMENT AND PLAN:   48 year old obese male with history of chronic respiratory failure on 3 L of oxygen, trilogy at home, history of essential hypertension chronic kidney disease stage III comes in because of shortness of breath./Lethargy  Acute on chronic respiratory failure with hypoxemia secondary  to acute on chronic systolic heart failure, COPD exacerbation.  Off the BiPAP, continue chronic oxygen 3 L, use trilogy at night, wondering if patient needs adjustment of settings for that as patient says that he is just started using this recently.  Continue IV diuretics for now, possible discharge home tomorrow with oral diuretics.  Higher dose than he is taking at home.   #2. chronic systolic heart failure, estimated EF 45 to 50%, Continue IV Lasix  3 diabetes mellitus type 2 with the long-term use of insulin without complications, continue home dose insulin. 4.  History of COPD with chronic respiratory failure on 3 L of oxygen at home, has sleep apnea, uses trilogy and follows up with Dr. [name]. #4 history of CAD, patient is on Imdur, atorvastatin, aspirin. 5. History of stroke, chronic atrial fibrillation patient is on Eliquis. 6.  Pulmonary sarcoidosis, follows up with Dr. [name]. Chronic hypercapnia' All the records are reviewed and case discussed with Care Management/Social Workerr. Management plans discussed with the patient, family and they are in agreement.  CODE STATUS:full More than 50% time spent in counseling, coordination of care TOTAL TIME TAKING CARE OF THIS PATIENT: 35 minutes.   POSSIBLE D/C IN 1-2 DAYS, DEPENDING ON CLINICAL CONDITION.   Epifanio Lesches M.D on 11/20/2018 at 10:37 AM  Between 7am to 6pm - Pager - [phone]  After 6pm go to [email or web address] - password [email or web address] [facility]  Office  [phone]  CC: Primary care physician; [place]   Note: This dictation was prepared with Dragon dictation along with smaller phrase technology. Any transcriptional errors that result from this process are unintentional.

## 2018-11-20 NOTE — Care Management Important Message (Signed)
Important Message  Patient Details  Name: [name] MRN: [id number] Date of Birth: 24-Jul-1970   Medicare Important Message Given:  Yes     Dannette Barbara 11/20/2018, 12:44 PM

## 2018-11-20 NOTE — Progress Notes (Signed)
Inpatient Diabetes Program Recommendations  AACE/ADA: New Consensus Statement on Inpatient Glycemic Control   Target Ranges:  Prepandial:   less than 140 mg/dL      Peak postprandial:   less than 180 mg/dL (1-2 hours)      Critically ill patients:  140 - 180 mg/dL   Results for [name], [name] (MRN [id number]) as of 11/20/2018 11:06  Ref. Range 11/19/2018 08:10 11/19/2018 12:17 11/19/2018 16:29 11/19/2018 20:42 11/20/2018 07:55  Glucose-Capillary Latest Ref Range: 70 - 99 mg/dL 188 (H) 256 (H) 171 (H) 351 (H) 253 (H)   Review of Glycemic Control  Diabetes history: DM2 Outpatient Diabetes medications: Levemir 40-50 units QHS, Humalog 12-15 units TID with meals Current orders for Inpatient glycemic control: Levemir 10 units QHS, Novolog 0-9 units TID with meals, Novolog 0-5 units QHS; Solumedrol 40 mg daily  Inpatient Diabetes Program Recommendations:   Insulin-Basal: If steroids are continued, please consider increasing Levemir to 13 units QHS.  Insulin-Meal Coverage: If steroids are continued, please consider ordering Novolog 3 units TID with meals for meal coverage if patient eats at least 50% of meals.  Diet: Added Carb Modified to current Heart Healthy diet.  Thanks, Barnie Alderman, RN, MSN, CDE Diabetes Coordinator Inpatient Diabetes Program [phone] (Team Pager from 8am to 5pm)

## 2018-11-21 ENCOUNTER — Ambulatory Visit: Payer: Disability Insurance | Admitting: Family

## 2018-11-21 LAB — CBC
HCT: 48 % (ref 39.0–52.0)
Hemoglobin: 14.5 g/dL (ref 13.0–17.0)
MCH: 27.5 pg (ref 26.0–34.0)
MCHC: 30.2 g/dL (ref 30.0–36.0)
MCV: 91.1 fL (ref 80.0–100.0)
Platelets: 229 10*3/uL (ref 150–400)
RBC: 5.27 MIL/uL (ref 4.22–5.81)
RDW: 12.2 % (ref 11.5–15.5)
WBC: 15.1 10*3/uL — ABNORMAL HIGH (ref 4.0–10.5)
nRBC: 0 % (ref 0.0–0.2)

## 2018-11-21 LAB — GLUCOSE, CAPILLARY
Glucose-Capillary: 234 mg/dL — ABNORMAL HIGH (ref 70–99)
Glucose-Capillary: 218 mg/dL — ABNORMAL HIGH (ref 70–99)

## 2018-11-21 MED ORDER — PREDNISONE 10 MG (21) PO TBPK: ORAL_TABLET | ORAL | 0 refills | Status: DC

## 2018-11-21 MED ORDER — FUROSEMIDE 80 MG PO TABS: 60.0000 mg | ORAL_TABLET | Freq: Two times a day (BID) | ORAL | 5 refills | Status: DC

## 2018-11-21 NOTE — Discharge Summary (Signed)
[name], is a 48 y.o. male  DOB [birth year]  MRN [id number].  Admission date:  11/17/2018  Admitting Physician  Epifanio Lesches, MD  Discharge Date:  11/21/2018   Primary MD  [place]  Recommendations for primary care physician for things to follow:   Follow-up with PCP in 1 week Follow-up with Dr. [name] as scheduled.   Admission Diagnosis  SOB (shortness of breath) [R06.02] Acute respiratory failure with hypoxia and hypercarbia (HCC) [J96.01, J96.02]   Discharge Diagnosis  SOB (shortness of breath) [R06.02] Acute respiratory failure with hypoxia and hypercarbia (HCC) [J96.01, J96.02]   Active Problems:   Acute on chronic respiratory failure with hypoxia and hypercapnia (HCC)      Past Medical History:  Diagnosis Date  . CHF (congestive heart failure) ([place])   . COPD (chronic obstructive pulmonary disease) ([place])   . Coronary artery disease   . Diabetes mellitus without complication ([place])   . Hypercholesteremia unk  . Hypertension   . Myocardial infarction ([place])   . Sarcoidosis   . Sleep apnea    does not use CPAP regularly.  . Stroke [facility])     Past Surgical History:  Procedure Laterality Date  . APPENDECTOMY    . PARTIAL NEPHRECTOMY         History of present illness and  Hospital Course:     Kindly see H&P for history of present illness and admission details, please review complete Labs, Consult reports and Test reports for all details in brief  HPI  from the history and physical done on the day of admission 48 year old morbidly obese male with history of chronic respiratory failure on 3 L of oxygen, trilogy at home, history of diabetes mellitus type 2, pulmonary sarcoidosis, chronic systolic heart failure came in because of worsening shortness of breath with  lethargy, patient found to have severe respiratory acidosis with hypercapnia, admitted to stepdown unit   Hospital Course  #1 acute on chronic respiratory failure with hypoxia, hypercapnia secondary to COPD and CHF exacerbation.  Patient was on BiPAP and admitted to stepdown unit, weaned off of for the BiPAP, changed to nasal cannula and transferred out of ICU has been he became more alert.  Patient has trilogy at home which started recently.  Follows up with Dr. [name] regarding pulmonary sarcoidosis, advised him to follow-up with him and maybe he needs adjustment of his trilogy to prevent to chronic hypercapnia.   2.  Acute on chronic systolic heart failure, EF 45 to 50%.;  Received IV Lasix in the hospital, patient can take higher dose of Lasix at home for a few days which she will be 60 mg p.o. twice daily for 4 days followed by resumption of his home dose.  Explained that to the patient. 3.  Diabetes mellitus type 2 with long-term use of insulin; patient has been having elevated blood sugar secondary to steroids that were used for COPD flare.  Patient on high-dose Levemir, Humalog at home.  Advised to continue that. 4.  COPD exacerbation, improving, no wheezing, patient will be discharged home with prednisone Dosepak, can use his home dose nebulizer. 5.  Pulmonary sarcoidosis, patient follows up with Dr. [name]. 6.  History of CAD, patient on Imdur, atorvastatin, aspirin. 7.  History of stroke, patient on Eliquis. 8.  Chronic hypercapnia patient can follow with Dr. [name] and may need setting changes for his home trilogy/BiPAP.  Regarding chronic systolic heart failure patient can take higher dose of Lasix, follow-up  with CHF clinic, continue beta-blockers, lisinopril, Lasix.   Discharge Condition: Stable   Follow UP  Follow-up Information    [place] Follow up on 12/08/2018.   Specialty: Cardiology Why: at 11:30am. Please enter through  the [place] entrance Contact information: [place] [phone]       [place] an appointment as soon as possible for a visit in 1 week(s).   Contact information: [place] [address] [phone]        Erby Pian, MD. Schedule an appointment as soon as possible for a visit in 1 week(s).   Specialty: Specialist Contact information: [place] [address] [phone]             Discharge Instructions  and  Discharge Medications      Allergies as of 11/21/2018      Reactions   Penicillins Anaphylaxis, Other (See Comments)   Has patient had a PCN reaction causing immediate rash, facial/tongue/throat swelling, SOB or lightheadedness with hypotension: Yes Has patient had a PCN reaction causing severe rash involving mucus membranes or skin necrosis: No Has patient had a PCN reaction that required hospitalization: No Has patient had a PCN reaction occurring within the last 10 years: No If all of the above answers are "NO", then may proceed with Cephalosporin use.      Medication List    TAKE these medications   albuterol 108 (90 Base) MCG/ACT inhaler Commonly known as: VENTOLIN HFA Inhale 1-2 puffs into the lungs every 4 (four) hours as needed for wheezing or shortness of breath.   albuterol (2.5 MG/3ML) 0.083% nebulizer solution Commonly known as: PROVENTIL Take 3 mLs (2.5 mg total) by nebulization every 6 (six) hours as needed for wheezing or shortness of breath.   apixaban 5 MG Tabs tablet Commonly known as: ELIQUIS Take 5 mg by mouth 2 (two) times daily.   aspirin 81 MG chewable tablet Chew 81 mg by mouth daily.   atorvastatin 40 MG tablet Commonly known as: LIPITOR Take 40 mg by mouth at bedtime.   Fluticasone-Salmeterol 250-50 MCG/DOSE Aepb Commonly known as: ADVAIR Inhale 1 puff into the lungs 2 (two) times  daily.   furosemide 80 MG tablet Commonly known as: LASIX Take 1 tablet (80 mg total) by mouth 2 (two) times daily. 60 mg p.o. twice daily for 4 days , resume home dose thereafter. What changed:   when to take this  additional instructions   insulin detemir 100 UNIT/ML injection Commonly known as: LEVEMIR Inject 40-50 Units into the skin at bedtime.   insulin lispro 100 UNIT/ML injection Commonly known as: HUMALOG Inject 12-15 Units into the skin 3 (three) times daily with meals. Sliding scale as needed   ipratropium-albuterol 0.5-2.5 (3) MG/3ML Soln Commonly known as: DUONEB Inhale 3 mLs into the lungs 4 (four) times daily as needed for wheezing.   Klor-Con M20 20 MEQ tablet Generic drug: potassium chloride SA TAKE ONE TABLET BY MOUTH TWICE DAILY What changed: how much to take   lisinopril 30 MG tablet Commonly known as: ZESTRIL Take 30 mg by mouth at bedtime.   Melatonin 5 MG Tabs Take 5 mg by mouth at bedtime as needed for sleep.   methocarbamol 500 MG tablet Commonly known as: Robaxin 1-2 tablets qid prn muscle spasms   metoprolol tartrate 100 MG tablet Commonly known as: LOPRESSOR Take 1 tablet (100  mg total) by mouth 2 (two) times daily.   predniSONE 10 MG (21) Tbpk tablet Commonly known as: STERAPRED UNI-PAK 21 TAB Take as directed   sildenafil 20 MG tablet Commonly known as: REVATIO Take 20 mg by mouth 3 (three) times daily.   theophylline 300 MG 12 hr tablet Commonly known as: THEODUR Take 300 mg by mouth 2 (two) times daily.   tiotropium 18 MCG inhalation capsule Commonly known as: SPIRIVA Place 18 mcg into inhaler and inhale daily.         Diet and Activity recommendation: See Discharge Instructions above   Consults obtained -intensivist   Major procedures and Radiology Reports - PLEASE review detailed and final reports for all details, in brief -      Dg Chest Port 1 View  Result Date: 11/17/2018 CLINICAL DATA:  Shortness of  breath EXAM: PORTABLE CHEST 1 VIEW COMPARISON:  Yesterday FINDINGS: Patchy bilateral pulmonary opacity with scarring/atelectasis on both sides by 2019 chest CT. Chronic elevation of the left diaphragm. Normal heart size for portable technique. No effusion or pneumothorax. IMPRESSION: Stable scarring and left diaphragm elevation. No acute superimposed finding. Electronically Signed   By: Monte Fantasia M.D.   On: 11/17/2018 07:49   Dg Chest Portable 1 View  Result Date: 11/16/2018 CLINICAL DATA:  Shortness of breath. EXAM: PORTABLE CHEST 1 VIEW COMPARISON:  November 05, 2018. FINDINGS: Normal cardiomediastinal silhouette. No pneumothorax is noted. Stable bilateral lung scarring is noted. No definite acute abnormality is noted. Bony thorax is unremarkable. Stable elevated left hemidiaphragm IMPRESSION: Stable bilateral lung scarring is noted. No significant change compared to prior exam Electronically Signed   By: Marijo Conception M.D.   On: 11/16/2018 16:04   Dg Chest Portable 1 View  Result Date: 11/05/2018 CLINICAL DATA:  Mid chest pain. Shortness of breath. EXAM: PORTABLE CHEST 1 VIEW COMPARISON:  Radiograph 07/28/2018. CT 02/03/2018 FINDINGS: Chronic elevation of left hemidiaphragm. Multifocal lung scarring, stable from prior exam. Unchanged heart size and mediastinal contours. No acute airspace disease, pulmonary edema, pleural effusion or pneumothorax. No acute osseous abnormalities. IMPRESSION: 1. No acute abnormality or change from prior exam. 2. Chronic elevation of left hemidiaphragm and multifocal lung scarring. Electronically Signed   By: Keith Rake M.D.   On: 11/05/2018 22:22    Micro Results     Recent Results (from the past 240 hour(s))  SARS Coronavirus 2 by RT PCR (hospital order, performed in [facility] hospital lab) Nasopharyngeal Nasopharyngeal Swab     Status: None   Collection Time: 11/17/18  7:44 AM   Specimen: Nasopharyngeal Swab  Result Value Ref Range Status    SARS Coronavirus 2 NEGATIVE NEGATIVE Final    Comment: (NOTE) If result is NEGATIVE SARS-CoV-2 target nucleic acids are NOT DETECTED. The SARS-CoV-2 RNA is generally detectable in upper and lower  respiratory specimens during the acute phase of infection. The lowest  concentration of SARS-CoV-2 viral copies this assay can detect is 250  copies / mL. A negative result does not preclude SARS-CoV-2 infection  and should not be used as the sole basis for treatment or other  patient management decisions.  A negative result may occur with  improper specimen collection / handling, submission of specimen other  than nasopharyngeal swab, presence of viral mutation(s) within the  areas targeted by this assay, and inadequate number of viral copies  (<250 copies / mL). A negative result must be combined with clinical  observations, patient history, and epidemiological information. If result  is POSITIVE SARS-CoV-2 target nucleic acids are DETECTED. The SARS-CoV-2 RNA is generally detectable in upper and lower  respiratory specimens dur ing the acute phase of infection.  Positive  results are indicative of active infection with SARS-CoV-2.  Clinical  correlation with patient history and other diagnostic information is  necessary to determine patient infection status.  Positive results do  not rule out bacterial infection or co-infection with other viruses. If result is PRESUMPTIVE POSTIVE SARS-CoV-2 nucleic acids MAY BE PRESENT.   A presumptive positive result was obtained on the submitted specimen  and confirmed on repeat testing.  While 2019 novel coronavirus  (SARS-CoV-2) nucleic acids may be present in the submitted sample  additional confirmatory testing may be necessary for epidemiological  and / or clinical management purposes  to differentiate between  SARS-CoV-2 and other Sarbecovirus currently known to infect humans.  If clinically indicated additional testing with an alternate test   methodology [phone]) is advised. The SARS-CoV-2 RNA is generally  detectable in upper and lower respiratory sp ecimens during the acute  phase of infection. The expected result is Negative. Fact Sheet for Patients:  StrictlyIdeas.no Fact Sheet for Healthcare Providers: BankingDealers.co.za This test is not yet approved or cleared by the [place] FDA and has been authorized for detection and/or diagnosis of SARS-CoV-2 by FDA under an Emergency Use Authorization (EUA).  This EUA will remain in effect (meaning this test can be used) for the duration of the COVID-19 declaration under Section 564(b)(1) of the Act, 21 U.S.C. section 360bbb-3(b)(1), unless the authorization is terminated or revoked sooner. Performed at [facility], [address] [place], [place] [address]   MRSA PCR Screening     Status: None   Collection Time: 11/17/18  1:10 PM   Specimen: Nasopharyngeal  Result Value Ref Range Status   MRSA by PCR NEGATIVE NEGATIVE Final    Comment:        The GeneXpert MRSA Assay (FDA approved for NASAL specimens only), is one component of a comprehensive MRSA colonization surveillance program. It is not intended to diagnose MRSA infection nor to guide or monitor treatment for MRSA infections. Performed at [facility], [address] [place], [place] [address]        Today   Subjective:   [name]  no shortness of breath, stable for discharge. Objective:   Blood pressure 122/69, pulse 66, temperature 98.2 F (36.8 C), resp. rate 18, height 6' (1.829 m), weight 128.2 kg, SpO2 99 %.   Intake/Output Summary (Last 24 hours) at 11/21/2018 1204 Last data filed at 11/21/2018 1010 Gross per 24 hour  Intake 840 ml  Output 350 ml  Net 490 ml    Exam Awake Alert, Oriented x 3, No new F.N deficits, Normal affect .AT,PERRAL Supple Neck,No JVD, No cervical lymphadenopathy appriciated.   Symmetrical Chest wall movement, Good air movement bilaterally, CTAB RRR,No Gallops,Rubs or new Murmurs, No Parasternal Heave +ve B.Sounds, Abd Soft, Non tender, No organomegaly appriciated, No rebound -guarding or rigidity. No Cyanosis, Clubbing or edema, No new Rash or bruise  Data Review   CBC w Diff:  Lab Results  Component Value Date   WBC 15.1 (H) 11/21/2018   HGB 14.5 11/21/2018   HGB 15.1 05/28/2014   HCT 48.0 11/21/2018   HCT 45.2 05/28/2014   PLT 229 11/21/2018   PLT 185 05/28/2014   LYMPHOPCT 17 11/17/2018   LYMPHOPCT 18.6 05/28/2014   MONOPCT 16 11/17/2018   MONOPCT 15.8 05/28/2014  EOSPCT 1 11/17/2018   EOSPCT 1.3 05/28/2014   BASOPCT 1 11/17/2018   BASOPCT 0.3 05/28/2014    CMP:  Lab Results  Component Value Date   NA 142 11/20/2018   NA 140 05/28/2014   K 4.4 11/20/2018   K 3.9 05/28/2014   CL 93 (L) 11/20/2018   CL 105 05/28/2014   CO2 41 (H) 11/20/2018   CO2 30 05/28/2014   BUN 29 (H) 11/20/2018   BUN 12 05/28/2014   CREATININE 1.52 (H) 11/20/2018   CREATININE 1.20 05/28/2014   PROT 7.0 11/17/2018   PROT 8.4 (H) 06/01/2013   ALBUMIN 3.6 11/17/2018   ALBUMIN 3.8 06/01/2013   BILITOT 0.7 11/17/2018   BILITOT 0.7 06/01/2013   ALKPHOS 88 11/17/2018   ALKPHOS 88 06/01/2013   AST 53 (H) 11/17/2018   AST 24 06/01/2013   ALT 47 (H) 11/17/2018   ALT 54 06/01/2013  .   Total Time in preparing paper work, data evaluation and todays exam - 35 minutes  Epifanio Lesches M.D on 11/21/2018 at 12:04 PM    Note: This dictation was prepared with Dragon dictation along with smaller phrase technology. Any transcriptional errors that result from this process are unintentional.

## 2018-11-21 NOTE — Progress Notes (Signed)
Patient discharged home per MD order. All discharge instructions given and all questions answered. 

## 2018-11-21 NOTE — Progress Notes (Addendum)
Inpatient Diabetes Program Recommendations  AACE/ADA: New Consensus Statement on Inpatient Glycemic Control   Target Ranges:  Prepandial:   less than 140 mg/dL      Peak postprandial:   less than 180 mg/dL (1-2 hours)      Critically ill patients:  140 - 180 mg/dL   Results for MILO, CAPEL (MRN [id number]) as of 11/21/2018 11:32  Ref. Range 11/20/2018 07:55 11/20/2018 12:08 11/20/2018 17:57 11/20/2018 21:45 11/21/2018 07:47  Glucose-Capillary Latest Ref Range: 70 - 99 mg/dL 253 (H) 351 (H) 343 (H) 241 (H) 234 (H)   Review of Glycemic Control  Diabetes history: DM2 Outpatient Diabetes medications: Levemir 40-50 units QHS, Humalog 12-15 units TID with meals Current orders for Inpatient glycemic control: Levemir 13 units QHS, Novolog 0-9 units TID with meals, Novolog 0-5 units QHS, Novolog 3 units TID with meals; Solumedrol 40 mg daily  Inpatient Diabetes Program Recommendations:   Insulin-Basal: If steroids are continued, please consider increasing Levemir to 17 units QHS.  Insulin-Meal Coverage: If steroids are continued, please consider increasing meal coverage to Novolog 6 units TID with meals if patient eats at least 50% of meals  Thanks, Barnie Alderman, RN, MSN, CDE Diabetes Coordinator Inpatient Diabetes Program [phone] (Team Pager from 8am to 5pm)

## 2018-11-29 IMAGING — CT CT ANGIO CHEST
2 of 6 series · 18 of 46 positions shown · IV contrast (APPLIED)
Comparison: Chest x-ray from today and chest CT February 15, 2015.

CLINICAL DATA: Cough and shortness of breath. History of
sarcoidosis.

EXAM:
CT ANGIOGRAPHY CHEST WITH CONTRAST
TECHNIQUE: Multidetector CT imaging of the chest was performed using the
standard protocol during bolus administration of intravenous
contrast. Multiplanar CT image reconstructions and MIPs were
obtained to evaluate the vascular anatomy.
CONTRAST:  75 mL of Isovue 370

[Series 5: thins · axial · 0.86mm/px · z∈[-423,-144]mm · 16 of 307 slices shown]
[im 14/307  lung]
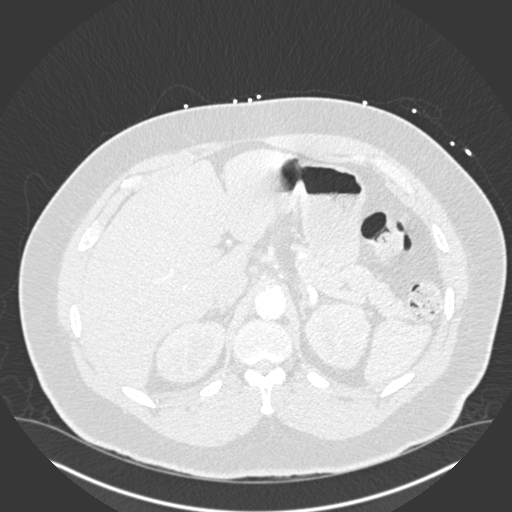
[im 40/307  soft-tissue]
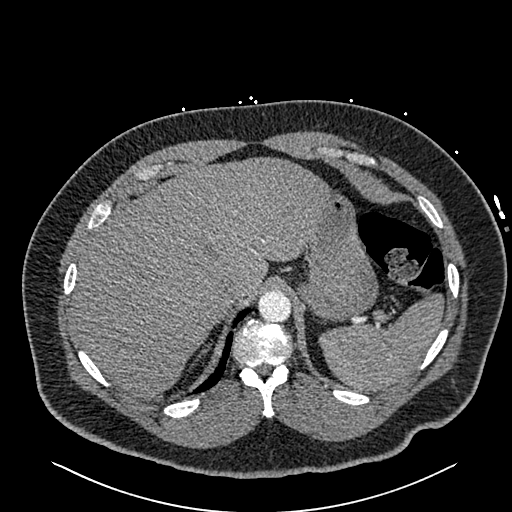
[im 54/307  lung]
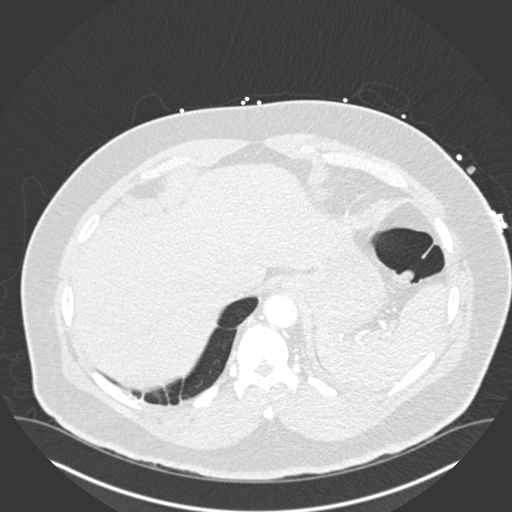
[im 67/307  soft-tissue]
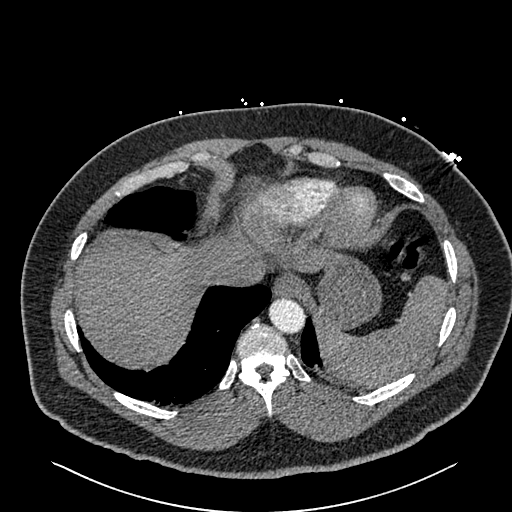
[im 94/307  lung]
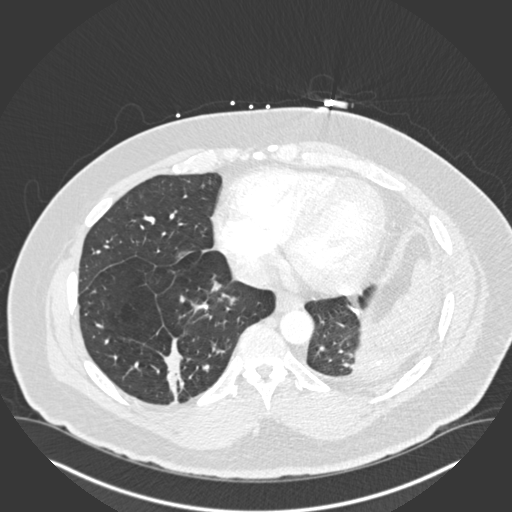
[im 107/307  soft-tissue]
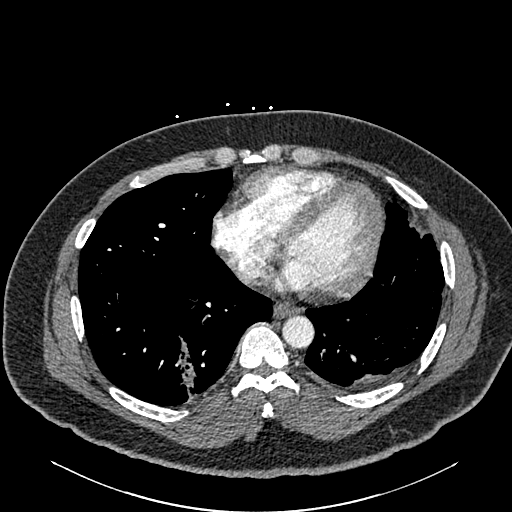
[im 120/307  lung]
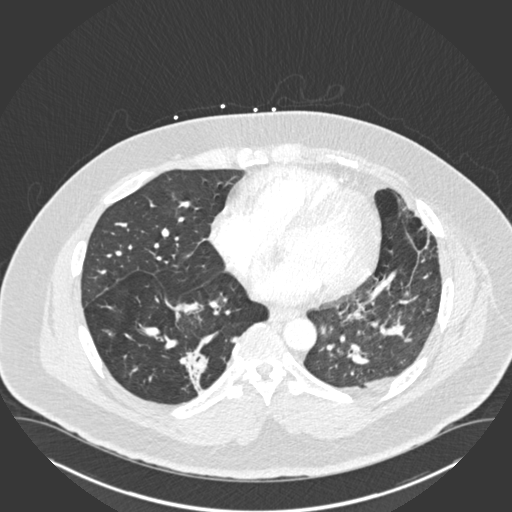
[im 147/307  soft-tissue]
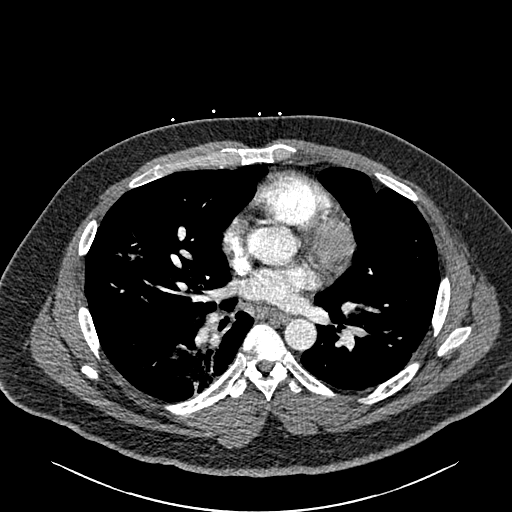
[im 160/307  lung]
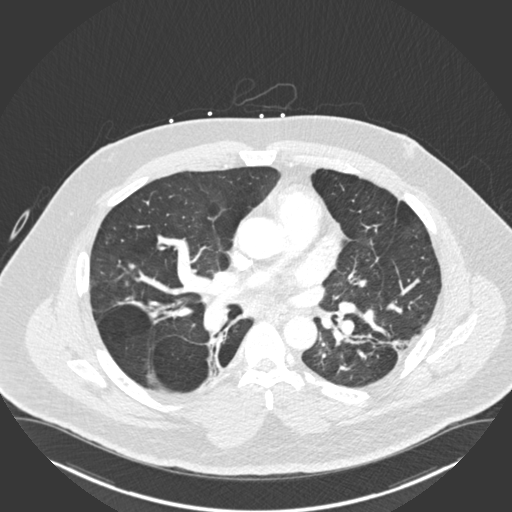
[im 187/307  soft-tissue]
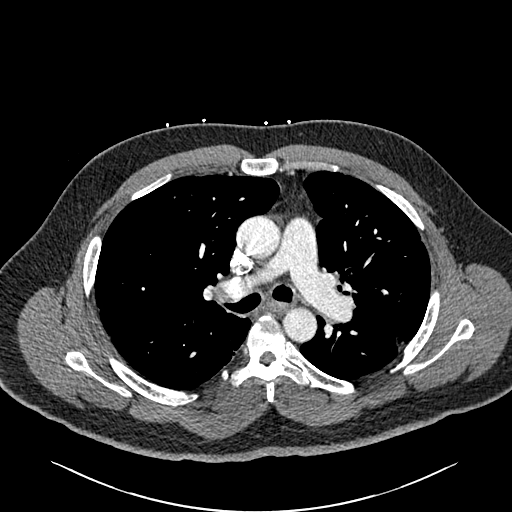
[im 200/307  lung]
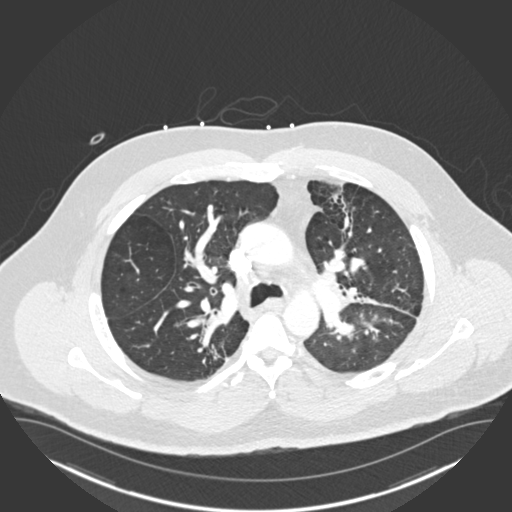
[im 213/307  soft-tissue]
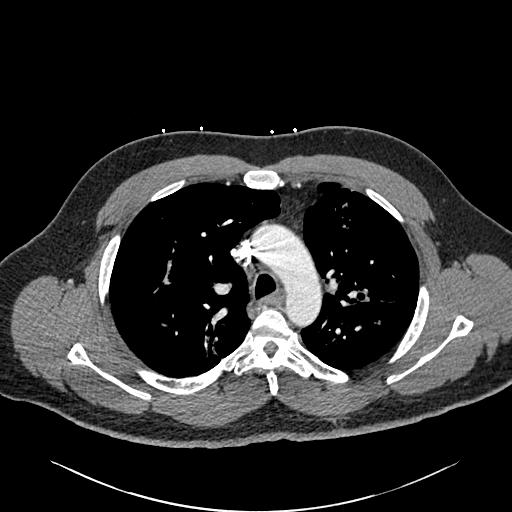
[im 240/307  lung]
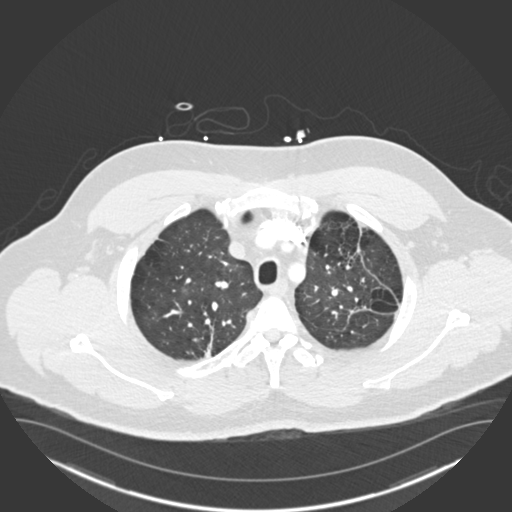
[im 253/307  soft-tissue]
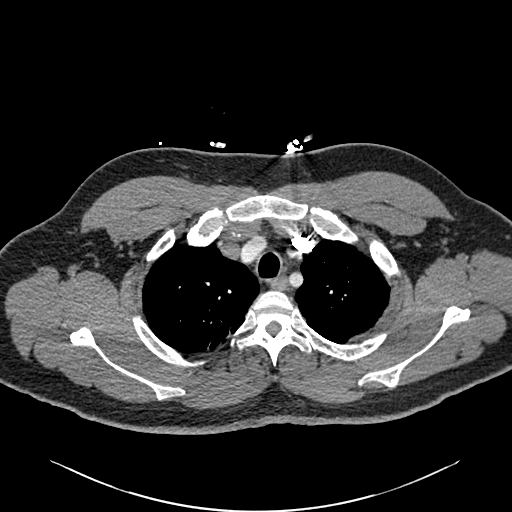
[im 267/307  lung]
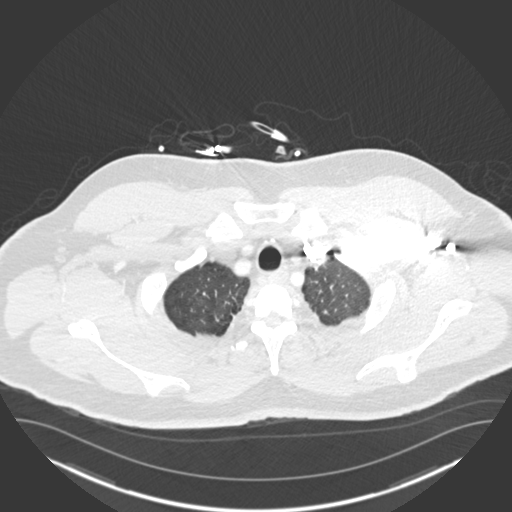
[im 293/307  soft-tissue]
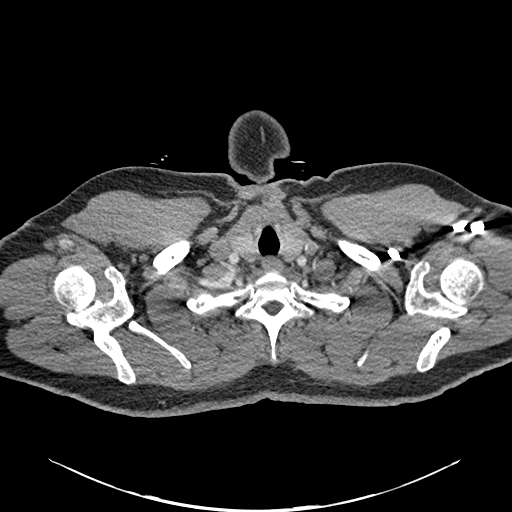

[Series 7: coronal mpr · coronal · 0.60mm/px · 2 of 100 slices shown]
[im 34/100  soft-tissue]
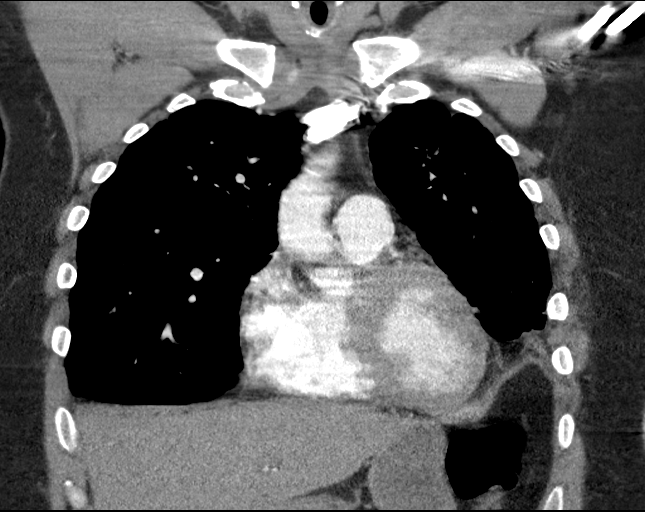
[im 67/100  soft-tissue]
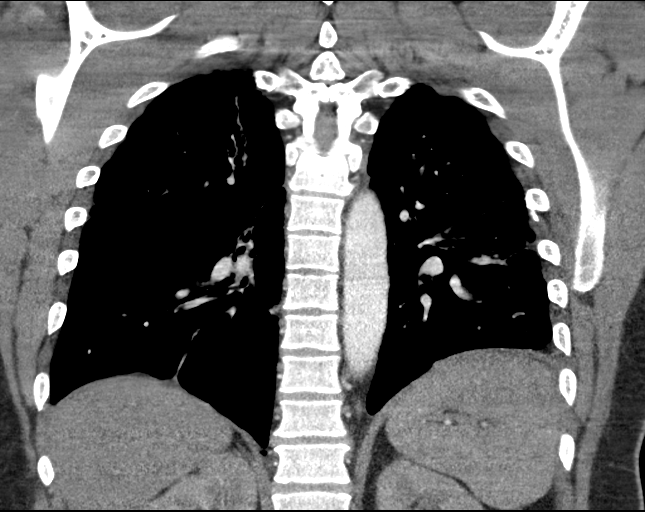

[18 of 46 positions shown; findings below may reference images not displayed]

FINDINGS: Cardiovascular: The heart is unchanged. The thoracic aorta is non
aneurysmal with no dissection. No pulmonary emboli are identified.

Mediastinum/Nodes: No enlarged mediastinal, hilar, or axillary lymph
nodes. Thyroid gland, trachea, and esophagus demonstrate no
significant findings.

Lungs/Pleura: Central airways are stable. Distortion in the lungs
consistent with known sarcoidosis are primarily chronic. Associated
emphysematous changes identified. There are patchy nodular opacities
primarily in the lung bases which are new when compared to August 15, 2014 and February 15, 2015 suggesting an acute on chronic process. No
new large infiltrates. No masses.

Upper Abdomen: No acute abnormality.

Musculoskeletal: No chest wall abnormality. No acute or significant
osseous findings.

Review of the MIP images confirms the above findings.
IMPRESSION: 1. There are chronic changes and architectural distortion in the
lungs consistent with known sarcoidosis. Patchy nodular opacities in
the bases are new since previous studies suggesting an acute on
chronic infectious or inflammatory process. Recommend follow-up to
resolution.
2. No pulmonary emboli.

## 2018-12-08 ENCOUNTER — Ambulatory Visit: Payer: Medicare HMO | Admitting: Family

## 2018-12-13 NOTE — Progress Notes (Signed)
Patient ID: [name], male    DOB: 08-27-1970, 48 y.o.   MRN: [id number]  HPI  [name] is a 48 y/o male with a history of CVA, obstructive sleep apnea, HTN, sarcoidosis, hyperlipidemia, DM, CAD, COPD, remote tobacco use and chronic heart failure.  Echo report from 11/17/2018 reviewed and showed an EF of 45-50%. Echo done 02/15/15 and showed an EF of 60% with increased wall thickness. Mild Corey along with mild TR. This is stable from a previous echo done on 08/15/14.  Admitted 10/12/20202 due to acute on chronic respiratory failure with hypoxia, hypercapnia secondary to COPD and CHF exacerbation.Initially needed bipap but then was weaned off. Given IV lasix initially and then transitioned to oral diuretics. Discharged after 4 days. Was in the ED 11/16/2018 due to shortness of breath where he was treated and released. Admitted 11/05/2018 due to acute exacerbation of COPD/HF. Cardiology consult obtained. Discharged after 3 days.   He presents today for a follow-up visit, although hasn't been seen since June 2018, with a chief complaint of minimal shortness of breath upon moderate exertion. He describes this as chronic in nature having been present for several years. He has associated pedal edema and infrequent cough. He denies any difficulty sleeping, dizziness, abdominal distention, palpitations, chest pain, wheezing, fatigue or weight gain. Does wear oxygen at 2L when at home or when he's going to be out and about for long periods of time; did not wear to the office today.   Past Medical History:  Diagnosis Date  . CHF (congestive heart failure) ([place])   . COPD (chronic obstructive pulmonary disease) ([place])   . Coronary artery disease   . Diabetes mellitus without complication ([place])   . Hypercholesteremia unk  . Hypertension   . Myocardial infarction ([place])   . Sarcoidosis   . Sleep apnea    does not use CPAP regularly.  . Stroke [facility])    Past Surgical History:  Procedure Laterality Date   . APPENDECTOMY    . PARTIAL NEPHRECTOMY     Family History  Problem Relation Age of Onset  . Hypertension Mother   . Heart disease Mother   . Diabetes Mother   . Cancer Father    Social History   Tobacco Use  . Smoking status: Current Every Day Smoker    Packs/day: 0.25    Years: 25.00    Pack years: 6.25    Types: Cigarettes    Last attempt to quit: 02/05/2015    Years since quitting: 3.8  . Smokeless tobacco: Never Used  Substance Use Topics  . Alcohol use: Yes    Alcohol/week: 0.0 standard drinks    Comment: occassional   Allergies  Allergen Reactions  . Penicillins Anaphylaxis and Other (See Comments)    Has patient had a PCN reaction causing immediate rash, facial/tongue/throat swelling, SOB or lightheadedness with hypotension: Yes Has patient had a PCN reaction causing severe rash involving mucus membranes or skin necrosis: No Has patient had a PCN reaction that required hospitalization: No Has patient had a PCN reaction occurring within the last 10 years: No If all of the above answers are "NO", then may proceed with Cephalosporin use.    Prior to Admission medications   Medication Sig Start Date End Date Taking? Authorizing Provider  albuterol (PROVENTIL) (2.5 MG/3ML) 0.083% nebulizer solution Take 3 mLs (2.5 mg total) by nebulization every 6 (six) hours as needed for wheezing or shortness of breath. 07/30/18  Yes Fritzi Mandes, MD  albuterol (VENTOLIN HFA) 108 (90 Base) MCG/ACT inhaler Inhale 1-2 puffs into the lungs every 4 (four) hours as needed for wheezing or shortness of breath.   Yes [provider]  apixaban (ELIQUIS) 5 MG TABS tablet Take 5 mg by mouth 2 (two) times daily. 06/11/16  Yes [provider]  aspirin 81 MG chewable tablet Chew 81 mg by mouth daily.   Yes [provider]  atorvastatin (LIPITOR) 40 MG tablet Take 40 mg by mouth at bedtime.    Yes [provider]  Fluticasone-Salmeterol (ADVAIR) 250-50 MCG/DOSE AEPB  Inhale 1 puff into the lungs 2 (two) times daily.   Yes [provider]  furosemide (LASIX) 80 MG tablet Take 1 tablet (80 mg total) by mouth 2 (two) times daily. 60 mg p.o. twice daily for 4 days , resume home dose thereafter. 11/21/18  Yes Epifanio Lesches, MD  insulin detemir (LEVEMIR) 100 UNIT/ML injection Inject 40-50 Units into the skin at bedtime.    Yes [provider]  insulin lispro (HUMALOG) 100 UNIT/ML injection Inject 12-15 Units into the skin 3 (three) times daily with meals. Sliding scale as needed   Yes [provider]  ipratropium-albuterol (DUONEB) 0.5-2.5 (3) MG/3ML SOLN Inhale 3 mLs into the lungs 4 (four) times daily as needed for wheezing. 09/11/18  Yes [provider]  KLOR-CON M20 20 MEQ tablet TAKE ONE TABLET BY MOUTH TWICE DAILY Patient taking differently: Take 20 mEq by mouth 2 (two) times daily.  08/20/16  Yes Adama Ferber A, FNP  lisinopril (ZESTRIL) 30 MG tablet Take 30 mg by mouth at bedtime.    Yes [provider]  Melatonin 5 MG TABS Take 5 mg by mouth at bedtime as needed for sleep.   Yes [provider]  methocarbamol (ROBAXIN) 500 MG tablet 1-2 tablets qid prn muscle spasms 07/22/16  Yes Letitia Neri L, PA-C  metoprolol tartrate (LOPRESSOR) 100 MG tablet Take 1 tablet (100 mg total) by mouth 2 (two) times daily. 07/30/18  Yes Fritzi Mandes, MD  sildenafil (REVATIO) 20 MG tablet Take 20 mg by mouth 3 (three) times daily. 09/30/17  Yes [provider]  theophylline (THEODUR) 300 MG 12 hr tablet Take 300 mg by mouth 2 (two) times daily.    Yes [provider]  tiotropium (SPIRIVA) 18 MCG inhalation capsule Place 18 mcg into inhaler and inhale daily.   Yes [provider]    Review of Systems  Constitutional: Negative for appetite change and fatigue.  HENT: Negative for congestion, postnasal drip and sore throat.   Eyes: Negative.   Respiratory: Positive for cough (infrequent) and  shortness of breath (infrequent). Negative for chest tightness and wheezing.   Cardiovascular: Positive for leg swelling. Negative for chest pain and palpitations.  Gastrointestinal: Negative for abdominal distention and abdominal pain.  Endocrine: Negative.   Genitourinary: Negative.   Musculoskeletal: Negative for back pain and neck pain.  Skin: Negative.   Allergic/Immunologic: Negative.   Neurological: Negative for dizziness and light-headedness.  Hematological: Negative for adenopathy. Does not bruise/bleed easily.  Psychiatric/Behavioral: Negative for dysphoric mood and sleep disturbance. The patient is not nervous/anxious.    Vitals:   12/15/18 1405  BP: 131/90  Pulse: 93  Resp: 18  SpO2: (!) 89%  Weight: 280 lb 9.6 oz (127.3 kg)  Height: 6' (1.829 m)   Wt Readings from Last 3 Encounters:  12/15/18 280 lb 9.6 oz (127.3 kg)  11/21/18 282 lb 10.1 oz (128.2 kg)  11/16/18 269  lb (122 kg)   Lab Results  Component Value Date   CREATININE 1.52 (H) 11/20/2018   CREATININE 1.47 (H) 11/18/2018   CREATININE 1.46 (H) 11/17/2018    Physical Exam  Constitutional: He is oriented to person, place, and time. He appears well-developed and well-nourished.  HENT:  Head: Normocephalic and atraumatic.  Neck: Normal range of motion. Neck supple. No JVD present.  Cardiovascular: Normal rate and regular rhythm.  Pulmonary/Chest: Effort normal. He has no wheezes. He has no rales.  Abdominal: Soft. He exhibits no distension. There is no abdominal tenderness.  Musculoskeletal:        General: Edema (1+pitting edema in bilateral lower legs) present. No tenderness.  Neurological: He is alert and oriented to person, place, and time.  Skin: Skin is warm and dry.  Psychiatric: He has a normal mood and affect. His behavior is normal. Thought content normal.  Nursing note and vitals reviewed.   Assessment & Plan:  1: Chronic heart failure with mildly reduced ejection fraction- - NYHA class  II - euvolemic today - weighing daily; Reminded to call for an overnight weight gain of >2 pounds or a weekly weight gain of >5 pounds - has not been to the gym since March 2020 (COVID) - drinking about 4-6 bottles of water daily.  - cooking with salt but not adding to food. Encouraged to not add salt at atll - saw cardiologist ([place]) 08/20/17 - BNP 11/17/2018 was 103.0 - discussed paramedicine program with the patient but he is not interested at this time; advised him to call us back if he changes his mind in the future  2: HTN- - BP mildly elevated today - sees PCP @ [facility] ([facility]) & had a phone visit with them 12/09/2018 - BMP 11/20/2018 reviewed and showed sodium 142, potassium 4.4, creatinine 1.52 and GFR >60  3: COPD-  - using trilogy at night - wearing oxygen at 2L when at home or when he's going to be out and about for awhile; on room air here today and he was 89% after walking over from the [place] entrance - saw pulmonologist [name]) 11/27/2018 & returns next week - last PFT's done 02/27/2018  4: Diabetes- - last A1c 6.7% on 07/29/2018 - continue insulin - sees nephrology Smith Mince) 01/15/2019  Patient did not bring his medications nor a list. Each medication was verbally reviewed with the patient and he was encouraged to bring the bottles to every visit to confirm accuracy of list.  Return in 2 months or sooner for any questions/problems before then.

## 2018-12-15 ENCOUNTER — Other Ambulatory Visit: Payer: Self-pay

## 2018-12-15 ENCOUNTER — Encounter: Payer: Self-pay | Admitting: Family

## 2018-12-15 ENCOUNTER — Ambulatory Visit: Payer: Medicare HMO | Attending: Family | Admitting: Family

## 2018-12-15 VITALS — BP 131/90 | HR 93 | Resp 18 | Ht 72.0 in | Wt 280.6 lb

## 2018-12-15 DIAGNOSIS — Z79899 Other long term (current) drug therapy: Secondary | ICD-10-CM | POA: Diagnosis not present

## 2018-12-15 DIAGNOSIS — Z833 Family history of diabetes mellitus: Secondary | ICD-10-CM | POA: Insufficient documentation

## 2018-12-15 DIAGNOSIS — Z8249 Family history of ischemic heart disease and other diseases of the circulatory system: Secondary | ICD-10-CM | POA: Insufficient documentation

## 2018-12-15 DIAGNOSIS — E119 Type 2 diabetes mellitus without complications: Secondary | ICD-10-CM | POA: Insufficient documentation

## 2018-12-15 DIAGNOSIS — I252 Old myocardial infarction: Secondary | ICD-10-CM | POA: Insufficient documentation

## 2018-12-15 DIAGNOSIS — Z7901 Long term (current) use of anticoagulants: Secondary | ICD-10-CM | POA: Diagnosis not present

## 2018-12-15 DIAGNOSIS — I2510 Atherosclerotic heart disease of native coronary artery without angina pectoris: Secondary | ICD-10-CM | POA: Diagnosis not present

## 2018-12-15 DIAGNOSIS — G4733 Obstructive sleep apnea (adult) (pediatric): Secondary | ICD-10-CM | POA: Diagnosis not present

## 2018-12-15 DIAGNOSIS — Z905 Acquired absence of kidney: Secondary | ICD-10-CM | POA: Diagnosis not present

## 2018-12-15 DIAGNOSIS — Z7982 Long term (current) use of aspirin: Secondary | ICD-10-CM | POA: Diagnosis not present

## 2018-12-15 DIAGNOSIS — Z7951 Long term (current) use of inhaled steroids: Secondary | ICD-10-CM | POA: Insufficient documentation

## 2018-12-15 DIAGNOSIS — Z8673 Personal history of transient ischemic attack (TIA), and cerebral infarction without residual deficits: Secondary | ICD-10-CM | POA: Insufficient documentation

## 2018-12-15 DIAGNOSIS — J449 Chronic obstructive pulmonary disease, unspecified: Secondary | ICD-10-CM | POA: Diagnosis not present

## 2018-12-15 DIAGNOSIS — Z794 Long term (current) use of insulin: Secondary | ICD-10-CM | POA: Diagnosis not present

## 2018-12-15 DIAGNOSIS — Z809 Family history of malignant neoplasm, unspecified: Secondary | ICD-10-CM | POA: Insufficient documentation

## 2018-12-15 DIAGNOSIS — I10 Essential (primary) hypertension: Secondary | ICD-10-CM

## 2018-12-15 DIAGNOSIS — F17210 Nicotine dependence, cigarettes, uncomplicated: Secondary | ICD-10-CM | POA: Insufficient documentation

## 2018-12-15 DIAGNOSIS — E7800 Pure hypercholesterolemia, unspecified: Secondary | ICD-10-CM | POA: Insufficient documentation

## 2018-12-15 DIAGNOSIS — I509 Heart failure, unspecified: Secondary | ICD-10-CM | POA: Diagnosis not present

## 2018-12-15 DIAGNOSIS — Z880 Allergy status to penicillin: Secondary | ICD-10-CM | POA: Diagnosis not present

## 2018-12-15 DIAGNOSIS — I5042 Chronic combined systolic (congestive) and diastolic (congestive) heart failure: Secondary | ICD-10-CM

## 2018-12-15 DIAGNOSIS — I110 Hypertensive heart disease with heart failure: Secondary | ICD-10-CM | POA: Diagnosis present

## 2018-12-15 DIAGNOSIS — J410 Simple chronic bronchitis: Secondary | ICD-10-CM

## 2018-12-15 NOTE — Patient Instructions (Signed)
Continue weighing daily and call for an overnight weight gain of > 2 pounds or a weekly weight gain of >5 pounds. 

## 2019-02-14 NOTE — Progress Notes (Signed)
Patient ID: AJAX STASH, male    DOB: 1970/07/12, 49 y.o.   MRN: [id number]  HPI  [name] is a 49 y/o male with a history of CVA, obstructive sleep apnea, HTN, sarcoidosis, hyperlipidemia, DM, CAD, COPD, remote tobacco use and chronic heart failure.  Echo report from 11/17/2018 reviewed and showed an EF of 45-50%. Echo done 02/15/15 and showed an EF of 60% with increased wall thickness. Mild MR along with mild TR. This is stable from a previous echo done on 08/15/14.  Admitted 10/12/20202 due to acute on chronic respiratory failure with hypoxia, hypercapnia secondary to COPD and CHF exacerbation.Initially needed bipap but then was weaned off. Given IV lasix initially and then transitioned to oral diuretics. Discharged after 4 days. Was in the ED 11/16/2018 due to shortness of breath where he was treated and released. Admitted 11/05/2018 due to acute exacerbation of COPD/HF. Cardiology consult obtained. Discharged after 3 days.   He presents today for a follow-up visit with a chief complaint of minimal shortness of breath upon moderate exertion. He describes this as chronic in nature having been present for several years. He has associated cough, pedal edema and gradual weight gain along with this. He denies any difficulty sleeping, abdominal distention, palpitations, chest pain, dizziness, wheezing or fatigue.   He says that he still hasn't resumed his exercise. He says that he has a stationary bike but he has to clean out a room to make space for it.   Past Medical History:  Diagnosis Date  . CHF (congestive heart failure) ([place])   . COPD (chronic obstructive pulmonary disease) ([place])   . Coronary artery disease   . Diabetes mellitus without complication ([place])   . Hypercholesteremia unk  . Hypertension   . Myocardial infarction ([place])   . Sarcoidosis   . Sleep apnea    does not use CPAP regularly.  . Stroke [facility])    Past Surgical History:  Procedure Laterality Date  . APPENDECTOMY     . PARTIAL NEPHRECTOMY     Family History  Problem Relation Age of Onset  . Hypertension Mother   . Heart disease Mother   . Diabetes Mother   . Cancer Father    Social History   Tobacco Use  . Smoking status: Current Every Day Smoker    Packs/day: 0.25    Years: 25.00    Pack years: 6.25    Types: Cigarettes    Last attempt to quit: 02/05/2015    Years since quitting: 4.0  . Smokeless tobacco: Never Used  Substance Use Topics  . Alcohol use: Yes    Alcohol/week: 0.0 standard drinks    Comment: occassional   Allergies  Allergen Reactions  . Penicillins Anaphylaxis and Other (See Comments)    Has patient had a PCN reaction causing immediate rash, facial/tongue/throat swelling, SOB or lightheadedness with hypotension: Yes Has patient had a PCN reaction causing severe rash involving mucus membranes or skin necrosis: No Has patient had a PCN reaction that required hospitalization: No Has patient had a PCN reaction occurring within the last 10 years: No If all of the above answers are "NO", then may proceed with Cephalosporin use.    Prior to Admission medications   Medication Sig Start Date End Date Taking? Authorizing Provider  albuterol (PROVENTIL) (2.5 MG/3ML) 0.083% nebulizer solution Take 3 mLs (2.5 mg total) by nebulization every 6 (six) hours as needed for wheezing or shortness of breath. 07/30/18  Yes Fritzi Mandes, MD  albuterol (  VENTOLIN HFA) 108 (90 Base) MCG/ACT inhaler Inhale 1-2 puffs into the lungs every 4 (four) hours as needed for wheezing or shortness of breath.   Yes [provider]  apixaban (ELIQUIS) 5 MG TABS tablet Take 5 mg by mouth 2 (two) times daily. 06/11/16  Yes [provider]  aspirin 81 MG chewable tablet Chew 81 mg by mouth daily.   Yes [provider]  atorvastatin (LIPITOR) 40 MG tablet Take 40 mg by mouth at bedtime.    Yes [provider]  Fluticasone-Salmeterol (ADVAIR) 250-50 MCG/DOSE AEPB Inhale 1 puff into  the lungs 2 (two) times daily.   Yes [provider]  furosemide (LASIX) 80 MG tablet Take 1 tablet (80 mg total) by mouth 2 (two) times daily. 60 mg p.o. twice daily for 4 days , resume home dose thereafter. Patient taking differently: Take 40 mg by mouth daily.  11/21/18  Yes Epifanio Lesches, MD  insulin detemir (LEVEMIR) 100 UNIT/ML injection Inject 40-50 Units into the skin at bedtime.    Yes [provider]  insulin lispro (HUMALOG) 100 UNIT/ML injection Inject 12-15 Units into the skin 3 (three) times daily with meals. Sliding scale as needed   Yes [provider]  ipratropium-albuterol (DUONEB) 0.5-2.5 (3) MG/3ML SOLN Inhale 3 mLs into the lungs 4 (four) times daily as needed for wheezing. 09/11/18  Yes [provider]  KLOR-CON M20 20 MEQ tablet TAKE ONE TABLET BY MOUTH TWICE DAILY Patient taking differently: Take 20 mEq by mouth 2 (two) times daily.  08/20/16  Yes ,  A, FNP  lisinopril (ZESTRIL) 30 MG tablet Take 30 mg by mouth at bedtime.    Yes [provider]  Melatonin 5 MG TABS Take 5 mg by mouth at bedtime as needed for sleep.   Yes [provider]  methocarbamol (ROBAXIN) 500 MG tablet 1-2 tablets qid prn muscle spasms 07/22/16  Yes Letitia Neri L, PA-C  metoprolol tartrate (LOPRESSOR) 100 MG tablet Take 1 tablet (100 mg total) by mouth 2 (two) times daily. 07/30/18  Yes Fritzi Mandes, MD  sildenafil (REVATIO) 20 MG tablet Take 20 mg by mouth 3 (three) times daily. 09/30/17  Yes [provider]  theophylline (THEODUR) 300 MG 12 hr tablet Take 300 mg by mouth 2 (two) times daily.    Yes [provider]  tiotropium (SPIRIVA) 18 MCG inhalation capsule Place 18 mcg into inhaler and inhale daily.   Yes [provider]    Review of Systems  Constitutional: Negative for appetite change and fatigue.  HENT: Negative for congestion, postnasal drip and sore throat.   Eyes: Negative.   Respiratory:  Positive for cough (infrequent) and shortness of breath. Negative for chest tightness and wheezing.   Cardiovascular: Positive for leg swelling (when standing for long periods of time). Negative for chest pain and palpitations.  Gastrointestinal: Negative for abdominal distention and abdominal pain.  Endocrine: Negative.   Genitourinary: Negative.   Musculoskeletal: Negative for back pain and neck pain.  Skin: Negative.   Allergic/Immunologic: Negative.   Neurological: Negative for dizziness and light-headedness.  Hematological: Negative for adenopathy. Does not bruise/bleed easily.  Psychiatric/Behavioral: Negative for dysphoric mood and sleep disturbance. The patient is not nervous/anxious.    Vitals:   02/16/19 1356  BP: (!) 141/84  Pulse: 88  Resp: 18  SpO2: 96%  Weight: 290 lb (131.5 kg)  Height: 6' (1.829 m)   Wt Readings from Last 3 Encounters:  02/16/19 290 lb (131.5 kg)  12/15/18 280 lb 9.6 oz (127.3 kg)  11/21/18 282 lb 10.1 oz (128.2 kg)   Lab Results  Component Value Date   CREATININE 1.52 (H) 11/20/2018   CREATININE 1.47 (H) 11/18/2018   CREATININE 1.46 (H) 11/17/2018    Physical Exam  Constitutional: He is oriented to person, place, and time. He appears well-developed and well-nourished.  HENT:  Head: Normocephalic and atraumatic.  Neck: No JVD present.  Cardiovascular: Normal rate and regular rhythm.  Pulmonary/Chest: Effort normal. He has no wheezes. He has no rales.  Abdominal: Soft. He exhibits no distension. There is no abdominal tenderness.  Musculoskeletal:        General: Edema (1+pitting edema in bilateral lower legs) present. No tenderness.     Cervical back: Normal range of motion and neck supple.  Neurological: He is alert and oriented to person, place, and time.  Skin: Skin is warm and dry.  Psychiatric: He has a normal mood and affect. His behavior is normal. Thought content normal.  Nursing note and vitals reviewed.   Assessment &  Plan:  1: Chronic heart failure with mildly reduced ejection fraction- - NYHA class II - euvolemic today - weighing daily; Reminded to call for an overnight weight gain of >2 pounds or a weekly weight gain of >5 pounds - weight up 10 pounds from last visit here 2 months ago - has not been to the gym since March 2020 (COVID) - drinking about 4-6 bottles of water daily.  - cooking with salt but not adding to food. Encouraged to not add salt at atll - saw cardiologist ([place]) 01/26/2019 - BNP 11/17/2018 was 103.0  2: HTN- - BP looks good today - sees PCP @ [facility] ([facility]) & had a phone visit with them 12/09/2018 - BMP 11/20/2018 reviewed and showed sodium 142, potassium 4.4, creatinine 1.52 and GFR >60  3: COPD-  - using trilogy at night - wearing oxygen at 2L when at home or when he's going to be out and about for awhile - saw pulmonologist [name]) 12/24/2018 - last PFT's done 02/27/2018  4: Diabetes- - last A1c 6.7% on 07/29/2018 - continue insulin - glucose at home today was 101 - had telemedicine visit with nephrology Smith Mince) 07/14/2018  Patient did not bring his medications nor a list. Each medication was verbally reviewed with the patient and he was encouraged to bring the bottles to every visit to confirm accuracy of list.  Return in 6 months or sooner for any questions or problems before then

## 2019-02-16 ENCOUNTER — Ambulatory Visit: Payer: Medicare HMO | Attending: Family | Admitting: Family

## 2019-02-16 ENCOUNTER — Other Ambulatory Visit: Payer: Self-pay

## 2019-02-16 ENCOUNTER — Encounter: Payer: Self-pay | Admitting: Family

## 2019-02-16 VITALS — BP 141/84 | HR 88 | Resp 18 | Ht 72.0 in | Wt 290.0 lb

## 2019-02-16 DIAGNOSIS — Z8249 Family history of ischemic heart disease and other diseases of the circulatory system: Secondary | ICD-10-CM | POA: Diagnosis not present

## 2019-02-16 DIAGNOSIS — Z8673 Personal history of transient ischemic attack (TIA), and cerebral infarction without residual deficits: Secondary | ICD-10-CM | POA: Diagnosis not present

## 2019-02-16 DIAGNOSIS — J449 Chronic obstructive pulmonary disease, unspecified: Secondary | ICD-10-CM | POA: Diagnosis not present

## 2019-02-16 DIAGNOSIS — Z7901 Long term (current) use of anticoagulants: Secondary | ICD-10-CM | POA: Diagnosis not present

## 2019-02-16 DIAGNOSIS — Z7982 Long term (current) use of aspirin: Secondary | ICD-10-CM | POA: Insufficient documentation

## 2019-02-16 DIAGNOSIS — G4733 Obstructive sleep apnea (adult) (pediatric): Secondary | ICD-10-CM | POA: Diagnosis not present

## 2019-02-16 DIAGNOSIS — E785 Hyperlipidemia, unspecified: Secondary | ICD-10-CM | POA: Insufficient documentation

## 2019-02-16 DIAGNOSIS — Z79899 Other long term (current) drug therapy: Secondary | ICD-10-CM | POA: Diagnosis not present

## 2019-02-16 DIAGNOSIS — E7800 Pure hypercholesterolemia, unspecified: Secondary | ICD-10-CM | POA: Diagnosis not present

## 2019-02-16 DIAGNOSIS — F17210 Nicotine dependence, cigarettes, uncomplicated: Secondary | ICD-10-CM | POA: Diagnosis not present

## 2019-02-16 DIAGNOSIS — I509 Heart failure, unspecified: Secondary | ICD-10-CM | POA: Insufficient documentation

## 2019-02-16 DIAGNOSIS — E119 Type 2 diabetes mellitus without complications: Secondary | ICD-10-CM | POA: Diagnosis not present

## 2019-02-16 DIAGNOSIS — I110 Hypertensive heart disease with heart failure: Secondary | ICD-10-CM | POA: Insufficient documentation

## 2019-02-16 DIAGNOSIS — Z905 Acquired absence of kidney: Secondary | ICD-10-CM | POA: Diagnosis not present

## 2019-02-16 DIAGNOSIS — I10 Essential (primary) hypertension: Secondary | ICD-10-CM

## 2019-02-16 DIAGNOSIS — I2510 Atherosclerotic heart disease of native coronary artery without angina pectoris: Secondary | ICD-10-CM | POA: Insufficient documentation

## 2019-02-16 DIAGNOSIS — Z794 Long term (current) use of insulin: Secondary | ICD-10-CM | POA: Insufficient documentation

## 2019-02-16 DIAGNOSIS — D869 Sarcoidosis, unspecified: Secondary | ICD-10-CM | POA: Insufficient documentation

## 2019-02-16 DIAGNOSIS — J410 Simple chronic bronchitis: Secondary | ICD-10-CM

## 2019-02-16 DIAGNOSIS — Z7951 Long term (current) use of inhaled steroids: Secondary | ICD-10-CM | POA: Insufficient documentation

## 2019-02-16 DIAGNOSIS — I5042 Chronic combined systolic (congestive) and diastolic (congestive) heart failure: Secondary | ICD-10-CM

## 2019-02-16 DIAGNOSIS — I252 Old myocardial infarction: Secondary | ICD-10-CM | POA: Diagnosis not present

## 2019-02-16 NOTE — Patient Instructions (Signed)
Continue weighing daily and call for an overnight weight gain of > 2 pounds or a weekly weight gain of >5 pounds. 

## 2019-06-10 ENCOUNTER — Other Ambulatory Visit: Payer: Self-pay

## 2019-06-10 ENCOUNTER — Encounter: Payer: Self-pay | Admitting: Emergency Medicine

## 2019-06-10 ENCOUNTER — Emergency Department: Payer: Medicare HMO

## 2019-06-10 ENCOUNTER — Inpatient Hospital Stay
Admission: EM | Admit: 2019-06-10 | Discharge: 2019-06-14 | DRG: 190 | Disposition: A | Discharge status: Home / Self Care | Payer: Medicare HMO | Attending: Internal Medicine | Admitting: Internal Medicine

## 2019-06-10 DIAGNOSIS — G4730 Sleep apnea, unspecified: Secondary | ICD-10-CM | POA: Diagnosis present

## 2019-06-10 DIAGNOSIS — E119 Type 2 diabetes mellitus without complications: Secondary | ICD-10-CM | POA: Diagnosis present

## 2019-06-10 DIAGNOSIS — J9602 Acute respiratory failure with hypercapnia: Secondary | ICD-10-CM

## 2019-06-10 DIAGNOSIS — Z905 Acquired absence of kidney: Secondary | ICD-10-CM

## 2019-06-10 DIAGNOSIS — J9621 Acute and chronic respiratory failure with hypoxia: Secondary | ICD-10-CM | POA: Diagnosis present

## 2019-06-10 DIAGNOSIS — Z20822 Contact with and (suspected) exposure to covid-19: Secondary | ICD-10-CM | POA: Diagnosis present

## 2019-06-10 DIAGNOSIS — D869 Sarcoidosis, unspecified: Secondary | ICD-10-CM | POA: Diagnosis present

## 2019-06-10 DIAGNOSIS — I252 Old myocardial infarction: Secondary | ICD-10-CM

## 2019-06-10 DIAGNOSIS — J441 Chronic obstructive pulmonary disease with (acute) exacerbation: Principal | ICD-10-CM | POA: Diagnosis present

## 2019-06-10 DIAGNOSIS — I248 Other forms of acute ischemic heart disease: Secondary | ICD-10-CM | POA: Diagnosis present

## 2019-06-10 DIAGNOSIS — E7800 Pure hypercholesterolemia, unspecified: Secondary | ICD-10-CM | POA: Diagnosis present

## 2019-06-10 DIAGNOSIS — I5033 Acute on chronic diastolic (congestive) heart failure: Secondary | ICD-10-CM | POA: Diagnosis present

## 2019-06-10 DIAGNOSIS — F17210 Nicotine dependence, cigarettes, uncomplicated: Secondary | ICD-10-CM | POA: Diagnosis present

## 2019-06-10 DIAGNOSIS — J9622 Acute and chronic respiratory failure with hypercapnia: Secondary | ICD-10-CM

## 2019-06-10 DIAGNOSIS — G4733 Obstructive sleep apnea (adult) (pediatric): Secondary | ICD-10-CM | POA: Diagnosis present

## 2019-06-10 DIAGNOSIS — Z8673 Personal history of transient ischemic attack (TIA), and cerebral infarction without residual deficits: Secondary | ICD-10-CM | POA: Diagnosis not present

## 2019-06-10 DIAGNOSIS — R3589 Other polyuria: Secondary | ICD-10-CM

## 2019-06-10 DIAGNOSIS — E785 Hyperlipidemia, unspecified: Secondary | ICD-10-CM | POA: Diagnosis present

## 2019-06-10 DIAGNOSIS — N179 Acute kidney failure, unspecified: Secondary | ICD-10-CM | POA: Diagnosis present

## 2019-06-10 DIAGNOSIS — I110 Hypertensive heart disease with heart failure: Secondary | ICD-10-CM | POA: Diagnosis present

## 2019-06-10 DIAGNOSIS — I2510 Atherosclerotic heart disease of native coronary artery without angina pectoris: Secondary | ICD-10-CM | POA: Diagnosis present

## 2019-06-10 DIAGNOSIS — Z8249 Family history of ischemic heart disease and other diseases of the circulatory system: Secondary | ICD-10-CM | POA: Diagnosis not present

## 2019-06-10 DIAGNOSIS — R358 Other polyuria: Secondary | ICD-10-CM | POA: Diagnosis present

## 2019-06-10 DIAGNOSIS — Z833 Family history of diabetes mellitus: Secondary | ICD-10-CM

## 2019-06-10 LAB — URINALYSIS, COMPLETE (UACMP) WITH MICROSCOPIC
Bacteria, UA: NONE SEEN
Bilirubin Urine: NEGATIVE
Glucose, UA: NEGATIVE mg/dL
Hgb urine dipstick: NEGATIVE
Ketones, ur: NEGATIVE mg/dL
Leukocytes,Ua: NEGATIVE
Nitrite: NEGATIVE
Protein, ur: 100 mg/dL — AB
Specific Gravity, Urine: 1.016 (ref 1.005–1.030)
Squamous Epithelial / HPF: NONE SEEN (ref 0–5)
pH: 5 (ref 5.0–8.0)

## 2019-06-10 LAB — CBC
HCT: 47.8 % (ref 39.0–52.0)
Hemoglobin: 15.1 g/dL (ref 13.0–17.0)
MCH: 28.1 pg (ref 26.0–34.0)
MCHC: 31.6 g/dL (ref 30.0–36.0)
MCV: 88.8 fL (ref 80.0–100.0)
Platelets: 193 10*3/uL (ref 150–400)
RBC: 5.38 MIL/uL (ref 4.22–5.81)
RDW: 13 % (ref 11.5–15.5)
WBC: 6.7 10*3/uL (ref 4.0–10.5)
nRBC: 0 % (ref 0.0–0.2)

## 2019-06-10 LAB — BLOOD GAS, VENOUS
Acid-Base Excess: 9 mmol/L — ABNORMAL HIGH (ref 0.0–2.0)
Bicarbonate: 40.3 mmol/L — ABNORMAL HIGH (ref 20.0–28.0)
O2 Saturation: 67.8 %
Patient temperature: 37
pCO2, Ven: 94 mmHg (ref 44.0–60.0)
pH, Ven: 7.24 — ABNORMAL LOW (ref 7.250–7.430)
pO2, Ven: 42 mmHg (ref 32.0–45.0)

## 2019-06-10 LAB — TROPONIN I (HIGH SENSITIVITY)
Troponin I (High Sensitivity): 34 ng/L — ABNORMAL HIGH (ref <18)
Troponin I (High Sensitivity): 29 ng/L — ABNORMAL HIGH (ref <18)

## 2019-06-10 LAB — URINE DRUG SCREEN, QUALITATIVE (ARMC ONLY)
Amphetamines, Ur Screen: NOT DETECTED
Barbiturates, Ur Screen: NOT DETECTED
Benzodiazepine, Ur Scrn: NOT DETECTED
Cannabinoid 50 Ng, Ur ~~LOC~~: NOT DETECTED
Cocaine Metabolite,Ur ~~LOC~~: NOT DETECTED
MDMA (Ecstasy)Ur Screen: NOT DETECTED
Methadone Scn, Ur: NOT DETECTED
Opiate, Ur Screen: NOT DETECTED
Phencyclidine (PCP) Ur S: NOT DETECTED
Tricyclic, Ur Screen: NOT DETECTED

## 2019-06-10 LAB — HEPATIC FUNCTION PANEL
ALT: 27 U/L (ref 0–44)
AST: 28 U/L (ref 15–41)
Albumin: 4 g/dL (ref 3.5–5.0)
Alkaline Phosphatase: 74 U/L (ref 38–126)
Bilirubin, Direct: <0.1 mg/dL (ref 0.0–0.2)
Total Bilirubin: 0.8 mg/dL (ref 0.3–1.2)
Total Protein: 7.3 g/dL (ref 6.5–8.1)

## 2019-06-10 LAB — BASIC METABOLIC PANEL
Anion gap: 7 (ref 5–15)
BUN: 18 mg/dL (ref 6–20)
CO2: 34 mmol/L — ABNORMAL HIGH (ref 22–32)
Calcium: 8.6 mg/dL — ABNORMAL LOW (ref 8.9–10.3)
Chloride: 99 mmol/L (ref 98–111)
Creatinine, Ser: 1.5 mg/dL — ABNORMAL HIGH (ref 0.61–1.24)
GFR calc Af Amer: >60 mL/min (ref >60)
GFR calc non Af Amer: 54 mL/min — ABNORMAL LOW (ref >60)
Glucose, Bld: 144 mg/dL — ABNORMAL HIGH (ref 70–99)
Potassium: 4.1 mmol/L (ref 3.5–5.1)
Sodium: 140 mmol/L (ref 135–145)

## 2019-06-10 LAB — BRAIN NATRIURETIC PEPTIDE: B Natriuretic Peptide: 75 pg/mL (ref 0.0–100.0)

## 2019-06-10 LAB — RESPIRATORY PANEL BY RT PCR (FLU A&B, COVID)
Influenza A by PCR: NEGATIVE
Influenza B by PCR: NEGATIVE
SARS Coronavirus 2 by RT PCR: NEGATIVE

## 2019-06-10 LAB — GLUCOSE, CAPILLARY: Glucose-Capillary: 155 mg/dL — ABNORMAL HIGH (ref 70–99)

## 2019-06-10 MED ORDER — IPRATROPIUM-ALBUTEROL 0.5-2.5 (3) MG/3ML IN SOLN
3.0000 mL | Freq: Four times a day (QID) | RESPIRATORY_TRACT | Status: DC
  Administered 2019-06-10 – 2019-06-11 (×3): 3 mL via RESPIRATORY_TRACT
  Filled 2019-06-10 (×3): qty 3

## 2019-06-10 MED ORDER — INSULIN ASPART 100 UNIT/ML ~~LOC~~ SOLN
0.0000 [IU] | Freq: Three times a day (TID) | SUBCUTANEOUS | Status: DC
  Administered 2019-06-11: 08:00:00 2 [IU] via SUBCUTANEOUS
  Administered 2019-06-11: 12:00:00 8 [IU] via SUBCUTANEOUS
  Administered 2019-06-11: 3 [IU] via SUBCUTANEOUS
  Administered 2019-06-12: 12:00:00 8 [IU] via SUBCUTANEOUS
  Administered 2019-06-12 (×2): 5 [IU] via SUBCUTANEOUS
  Administered 2019-06-13 – 2019-06-14 (×5): 8 [IU] via SUBCUTANEOUS
  Filled 2019-06-10 (×11): qty 1

## 2019-06-10 MED ORDER — BISACODYL 5 MG PO TBEC: 5.0000 mg | DELAYED_RELEASE_TABLET | Freq: Every day | ORAL | Status: DC | PRN

## 2019-06-10 MED ORDER — ACETAMINOPHEN 325 MG PO TABS: 650.0000 mg | ORAL_TABLET | Freq: Four times a day (QID) | ORAL | Status: DC | PRN

## 2019-06-10 MED ORDER — APIXABAN 5 MG PO TABS
5.0000 mg | ORAL_TABLET | Freq: Two times a day (BID) | ORAL | Status: DC
  Administered 2019-06-10 – 2019-06-14 (×8): 5 mg via ORAL
  Filled 2019-06-10 (×8): qty 1

## 2019-06-10 MED ORDER — ONDANSETRON HCL 4 MG PO TABS: 4.0000 mg | ORAL_TABLET | Freq: Four times a day (QID) | ORAL | Status: DC | PRN

## 2019-06-10 MED ORDER — HYDRALAZINE HCL 20 MG/ML IJ SOLN: 20.0000 mg | Freq: Four times a day (QID) | INTRAMUSCULAR | Status: DC | PRN

## 2019-06-10 MED ORDER — METHYLPREDNISOLONE SODIUM SUCC 125 MG IJ SOLR
125.0000 mg | Freq: Once | INTRAMUSCULAR | Status: AC
  Administered 2019-06-10: 125 mg via INTRAVENOUS
  Filled 2019-06-10: qty 2

## 2019-06-10 MED ORDER — ONDANSETRON HCL 4 MG/2ML IJ SOLN: 4.0000 mg | Freq: Four times a day (QID) | INTRAMUSCULAR | Status: DC | PRN

## 2019-06-10 MED ORDER — ACETAMINOPHEN 650 MG RE SUPP: 650.0000 mg | Freq: Four times a day (QID) | RECTAL | Status: DC | PRN

## 2019-06-10 MED ORDER — SODIUM CHLORIDE 0.9% FLUSH
3.0000 mL | Freq: Two times a day (BID) | INTRAVENOUS | Status: DC
  Administered 2019-06-10 – 2019-06-14 (×8): 3 mL via INTRAVENOUS

## 2019-06-10 MED ORDER — ENOXAPARIN SODIUM 40 MG/0.4ML ~~LOC~~ SOLN: 40.0000 mg | SUBCUTANEOUS | Status: DC

## 2019-06-10 MED ORDER — ATORVASTATIN CALCIUM 20 MG PO TABS
40.0000 mg | ORAL_TABLET | Freq: Every day | ORAL | Status: DC
  Administered 2019-06-10 – 2019-06-13 (×5): 40 mg via ORAL
  Filled 2019-06-10 (×5): qty 2

## 2019-06-10 MED ORDER — METHYLPREDNISOLONE SODIUM SUCC 40 MG IJ SOLR
40.0000 mg | Freq: Three times a day (TID) | INTRAMUSCULAR | Status: DC
  Administered 2019-06-10 – 2019-06-12 (×6): 40 mg via INTRAVENOUS
  Filled 2019-06-10 (×6): qty 1

## 2019-06-10 MED ORDER — SODIUM CHLORIDE 0.9% FLUSH: 3.0000 mL | Freq: Once | INTRAVENOUS | Status: DC

## 2019-06-10 MED ORDER — CHLORHEXIDINE GLUCONATE CLOTH 2 % EX PADS
6.0000 | MEDICATED_PAD | Freq: Every day | CUTANEOUS | Status: DC
  Administered 2019-06-10 – 2019-06-11 (×2): 6 via TOPICAL

## 2019-06-10 MED ORDER — SODIUM CHLORIDE 0.9 % IV SOLN
500.0000 mg | INTRAVENOUS | Status: AC
  Administered 2019-06-10 – 2019-06-12 (×3): 500 mg via INTRAVENOUS
  Filled 2019-06-10 (×3): qty 500

## 2019-06-10 MED ORDER — IPRATROPIUM-ALBUTEROL 0.5-2.5 (3) MG/3ML IN SOLN
3.0000 mL | Freq: Once | RESPIRATORY_TRACT | Status: AC
  Administered 2019-06-10: 3 mL via RESPIRATORY_TRACT
  Filled 2019-06-10: qty 3

## 2019-06-10 MED ORDER — MOMETASONE FURO-FORMOTEROL FUM 200-5 MCG/ACT IN AERO
2.0000 | INHALATION_SPRAY | Freq: Two times a day (BID) | RESPIRATORY_TRACT | Status: DC
  Administered 2019-06-10 – 2019-06-12 (×5): 2 via RESPIRATORY_TRACT
  Filled 2019-06-10: qty 8.8

## 2019-06-10 MED ORDER — INSULIN DETEMIR 100 UNIT/ML ~~LOC~~ SOLN
20.0000 [IU] | Freq: Every day | SUBCUTANEOUS | Status: DC
  Administered 2019-06-10 – 2019-06-13 (×4): 20 [IU] via SUBCUTANEOUS
  Filled 2019-06-10 (×5): qty 0.2

## 2019-06-10 MED ORDER — DOCUSATE SODIUM 100 MG PO CAPS: 200.0000 mg | ORAL_CAPSULE | Freq: Two times a day (BID) | ORAL | Status: DC | PRN

## 2019-06-10 MED ORDER — HYDROCODONE-ACETAMINOPHEN 5-325 MG PO TABS: 1.0000 | ORAL_TABLET | ORAL | Status: DC | PRN

## 2019-06-10 MED ORDER — METOPROLOL TARTRATE 50 MG PO TABS
100.0000 mg | ORAL_TABLET | Freq: Two times a day (BID) | ORAL | Status: DC
  Administered 2019-06-10 – 2019-06-14 (×9): 100 mg via ORAL
  Filled 2019-06-10 (×9): qty 2

## 2019-06-10 MED ORDER — ASPIRIN EC 81 MG PO TBEC
81.0000 mg | DELAYED_RELEASE_TABLET | Freq: Every day | ORAL | Status: DC
  Administered 2019-06-10 – 2019-06-14 (×5): 81 mg via ORAL
  Filled 2019-06-10 (×5): qty 1

## 2019-06-10 MED ORDER — ISOSORBIDE MONONITRATE 20 MG PO TABS
10.0000 mg | ORAL_TABLET | Freq: Three times a day (TID) | ORAL | Status: DC
  Administered 2019-06-10 – 2019-06-14 (×11): 10 mg via ORAL
  Filled 2019-06-10 (×14): qty 1

## 2019-06-10 MED ORDER — THEOPHYLLINE ER 300 MG PO TB12
300.0000 mg | ORAL_TABLET | Freq: Two times a day (BID) | ORAL | Status: DC
  Administered 2019-06-10 – 2019-06-14 (×8): 300 mg via ORAL
  Filled 2019-06-10 (×9): qty 1

## 2019-06-10 NOTE — ED Notes (Addendum)
Pt denies nvd and fevers. Pt staets he's been peeing more and states having chest pressure.

## 2019-06-10 NOTE — ED Notes (Signed)
Patient ambulated to use bathroom on 8 liter of oxygen.

## 2019-06-10 NOTE — Progress Notes (Addendum)
Cross Cover brief note  Notified by nurse patient with respiratory rate in 30's with sats 97% on 6 L [place]. Previously noted to be on bipap but patient requested to take it off.  Review Chest x-ray as noted previous scarring from underlying sarcoidosis.  BNP 75.  Initial VBG reviewed  7.24, CO2 94, PO2 42, Bicarb 40.3 O2 sat 67.  Patient reports not missing any of his daily medications except this am.  Patient with 1-2 + ankle, pretibial edema and patient states he does always have some edema in his legs and this has not increased lately.  He denies prolonged travel. 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  He denies pains in his legs. He denies history of blood clots.  He denies missing his daily eliquis therapy.  He does confirm prior need for intubation and mechanical ventilation and has difficult airway.  He also confirms being full code and if his condition should deteriorate he would want to be intubated. With patient  back on bipap.  Sats now 96% with Bipap settings  30% 14/6 rate of 10.  Respiratory rate remains 28-30. Tidal volumes fluctuating 320's to 500.  He does not appear in distress at this With his current repiratory status and underlying sarcoidosis, admission request was made to stepdown unit.  CCM/pulmonary consulted

## 2019-06-10 NOTE — ED Provider Notes (Signed)
[facility] Emergency Department Provider Note    First MD Initiated Contact with Patient 06/10/19 1258     (approximate)  I have reviewed the triage vital signs and the nursing notes.   HISTORY  Chief Complaint Shortness of Breath and Chest Pain    HPI [name] is a 49 y.o. male [place] past medical history presents to ER for evaluation of worsening shortness of breath chest tightness as well as polyuria for the past several days.  Denies any chest pressure.  Does wear chronic O2 at home.  Tried a nebulizer this morning with some improvement.  Denies any diaphoresis.  Does feel like his legs are little bit more swollen than usual.  Denies any recent fevers.  Denies any unilateral swelling.  Denies any burning when he urinates.  Denies any medication changes.    Past Medical History:  Diagnosis Date  . CHF (congestive heart failure) ([place])   . COPD (chronic obstructive pulmonary disease) ([place])   . Coronary artery disease   . Diabetes mellitus without complication ([place])   . Hypercholesteremia unk  . Hypertension   . Myocardial infarction ([place])   . Sarcoidosis   . Sleep apnea    does not use CPAP regularly.  . Stroke [facility])    Family History  Problem Relation Age of Onset  . Hypertension Mother   . Heart disease Mother   . Diabetes Mother   . Cancer Father    Past Surgical History:  Procedure Laterality Date  . APPENDECTOMY    . PARTIAL NEPHRECTOMY     Patient Active Problem List   Diagnosis Date Noted  . Acute on chronic respiratory failure with hypoxia and hypercapnia ([place]) 11/17/2018  . Acute exacerbation of CHF (congestive heart failure) ([place]) 11/07/2018  . Chest pain 11/06/2018  . Acute exacerbation of chronic obstructive pulmonary disease (COPD) ([place]) 07/29/2018  . COPD with acute exacerbation ([place]) 02/03/2018  . COPD (chronic obstructive pulmonary disease) ([place]) 07/16/2016  . Chronic diastolic heart failure ([place]) 03/23/2016    . Hypercarbia   . COPD exacerbation ([place]) 11/17/2015  . HTN (hypertension) 06/14/2015  . Dental caries 03/08/2015  . Morbid obesity ([place]) 02/21/2015  . OSA and COPD overlap syndrome ([place]) 02/21/2015  . Thrombocytopenia ([place]) 02/21/2015  . Hypernatremia 02/21/2015  . Diabetes ([place]) 10/19/2014  . Chronic combined systolic and diastolic congestive heart failure ([place]) 10/18/2014  . Chronic obstructive pulmonary disease ([place]) 10/18/2014  . Sarcoidosis 04/22/2013  . Other and unspecified hyperlipidemia 04/22/2013      Prior to Admission medications   Medication Sig Start Date End Date Taking? Authorizing Provider  albuterol (VENTOLIN HFA) 108 (90 Base) MCG/ACT inhaler Inhale 1-2 puffs into the lungs every 4 (four) hours as needed for wheezing or shortness of breath.   Yes [provider]  apixaban (ELIQUIS) 5 MG TABS tablet Take 5 mg by mouth 2 (two) times daily. 06/11/16  Yes [provider]  aspirin 81 MG EC tablet Take 81 mg by mouth at bedtime.    Yes [provider]  atorvastatin (LIPITOR) 40 MG tablet Take 40 mg by mouth at bedtime.    Yes [provider]  Fluticasone-Salmeterol (ADVAIR) 250-50 MCG/DOSE AEPB Inhale 1 puff into the lungs 2 (two) times daily.   Yes [provider]  furosemide (LASIX) 80 MG tablet Take 1 tablet (80 mg total) by mouth 2 (two) times daily. 60 mg p.o. twice daily for 4 days , resume home dose thereafter. Patient  taking differently: Take 40 mg by mouth daily.  11/21/18  Yes Epifanio Lesches, MD  insulin detemir (LEVEMIR) 100 UNIT/ML injection Inject 40-50 Units into the skin at bedtime.    Yes [provider]  insulin lispro (HUMALOG) 100 UNIT/ML injection Inject 5-15 Units into the skin 3 (three) times daily with meals. Sliding scale as needed   Yes [provider]  ipratropium-albuterol (DUONEB) 0.5-2.5 (3) MG/3ML SOLN Inhale 3 mLs into the lungs 4 (four) times daily as needed for wheezing.  09/11/18  Yes [provider]  isosorbide dinitrate (ISORDIL) 10 MG tablet Take 10 mg by mouth 3 (three) times daily.   Yes [provider]  lisinopril (ZESTRIL) 30 MG tablet Take 30 mg by mouth at bedtime.    Yes [provider]  Melatonin 5 MG TABS Take 5 mg by mouth at bedtime as needed for sleep.   Yes [provider]  metoprolol tartrate (LOPRESSOR) 100 MG tablet Take 1 tablet (100 mg total) by mouth 2 (two) times daily. Patient taking differently: Take 100 mg by mouth at bedtime.  07/30/18  Yes Fritzi Mandes, MD  potassium chloride SA (KLOR-CON) 20 MEQ tablet Take 20 mEq by mouth at bedtime.    Yes [provider]  theophylline (THEODUR) 300 MG 12 hr tablet Take 300 mg by mouth 2 (two) times daily.    Yes [provider]  tiotropium (SPIRIVA) 18 MCG inhalation capsule Place 18 mcg into inhaler and inhale daily.   Yes [provider]    Allergies Penicillins    Social History Social History   Tobacco Use  . Smoking status: Current Every Day Smoker    Packs/day: 0.25    Years: 25.00    Pack years: 6.25    Types: Cigarettes    Last attempt to quit: 02/05/2015    Years since quitting: 4.3  . Smokeless tobacco: Never Used  Substance Use Topics  . Alcohol use: Yes    Alcohol/week: 0.0 standard drinks    Comment: occassional  . Drug use: Yes    Types: Marijuana, PCP    Review of Systems Patient denies headaches, rhinorrhea, blurry vision, numbness, shortness of breath, chest pain, edema, cough, abdominal pain, nausea, vomiting, diarrhea, dysuria, fevers, rashes or hallucinations unless otherwise stated above in HPI. ____________________________________________   PHYSICAL EXAM:  VITAL SIGNS: Vitals:   06/10/19 1455 06/10/19 1500  BP:    Pulse: 78 79  Resp: (!) 26 19  Temp:    SpO2: 96% 95%    Constitutional: Alert and oriented.  Eyes: Conjunctivae are normal.  Head: Atraumatic. Nose: No  congestion/rhinnorhea. Mouth/Throat: Mucous membranes are moist.   Neck: No stridor. Painless ROM.  Cardiovascular: Normal rate, regular rhythm. Grossly normal heart sounds.  Good peripheral circulation. Respiratory: mild tachypnea with diffuse expiratory wheeze and diminished bibasilar breathsounds Gastrointestinal: Soft and nontender. No distention. No abdominal bruits. No CVA tenderness. Genitourinary:  Musculoskeletal: No lower extremity tenderness, 2+ edema.  No joint effusions. Neurologic:  Normal speech and language. No gross focal neurologic deficits are appreciated. No facial droop Skin:  Skin is warm, dry and intact. No rash noted. Psychiatric: Mood and affect are normal. Speech and behavior are normal.  ____________________________________________   LABS (all labs ordered are listed, but only abnormal results are displayed)  Results for orders placed or performed during the hospital encounter of 06/10/19 (from the past 24 hour(s))  Basic metabolic panel     Status: Abnormal   Collection Time: 06/10/19 10:52  AM  Result Value Ref Range   Sodium 140 135 - 145 mmol/L   Potassium 4.1 3.5 - 5.1 mmol/L   Chloride 99 98 - 111 mmol/L   CO2 34 (H) 22 - 32 mmol/L   Glucose, Bld 144 (H) 70 - 99 mg/dL   BUN 18 6 - 20 mg/dL   Creatinine, Ser 1.50 (H) 0.61 - 1.24 mg/dL   Calcium 8.6 (L) 8.9 - 10.3 mg/dL   GFR calc non Af Amer 54 (L) >60 mL/min   GFR calc Af Amer >60 >60 mL/min   Anion gap 7 5 - 15  CBC     Status: None   Collection Time: 06/10/19 10:52 AM  Result Value Ref Range   WBC 6.7 4.0 - 10.5 K/uL   RBC 5.38 4.22 - 5.81 MIL/uL   Hemoglobin 15.1 13.0 - 17.0 g/dL   HCT 47.8 39.0 - 52.0 %   MCV 88.8 80.0 - 100.0 fL   MCH 28.1 26.0 - 34.0 pg   MCHC 31.6 30.0 - 36.0 g/dL   RDW 13.0 11.5 - 15.5 %   Platelets 193 150 - 400 K/uL   nRBC 0.0 0.0 - 0.2 %  Troponin I (High Sensitivity)     Status: Abnormal   Collection Time: 06/10/19 10:52 AM  Result Value Ref Range   Troponin  I (High Sensitivity) 34 (H) <18 ng/L  Hepatic function panel     Status: None   Collection Time: 06/10/19 10:52 AM  Result Value Ref Range   Total Protein 7.3 6.5 - 8.1 g/dL   Albumin 4.0 3.5 - 5.0 g/dL   AST 28 15 - 41 U/L   ALT 27 0 - 44 U/L   Alkaline Phosphatase 74 38 - 126 U/L   Total Bilirubin 0.8 0.3 - 1.2 mg/dL   Bilirubin, Direct <0.1 0.0 - 0.2 mg/dL   Indirect Bilirubin NOT CALCULATED 0.3 - 0.9 mg/dL  Brain natriuretic peptide     Status: None   Collection Time: 06/10/19 11:52 AM  Result Value Ref Range   B Natriuretic Peptide 75.0 0.0 - 100.0 pg/mL  Troponin I (High Sensitivity)     Status: Abnormal   Collection Time: 06/10/19  1:36 PM  Result Value Ref Range   Troponin I (High Sensitivity) 29 (H) <18 ng/L  Blood gas, venous     Status: Abnormal   Collection Time: 06/10/19  2:19 PM  Result Value Ref Range   pH, Ven 7.24 (L) 7.250 - 7.430   pCO2, Ven 94 (HH) 44.0 - 60.0 mmHg   pO2, Ven 42.0 32.0 - 45.0 mmHg   Bicarbonate 40.3 (H) 20.0 - 28.0 mmol/L   Acid-Base Excess 9.0 (H) 0.0 - 2.0 mmol/L   O2 Saturation 67.8 %   Patient temperature 37.0    Collection site VEIN    Sample type VEIN    ____________________________________________  EKG My review and personal interpretation at Time: 10:38   Indication: sob  Rate: 80  Rhythm: sinus Axis: left Other: rbbb, nonspecific st abn, c/w previous tracing ____________________________________________  RADIOLOGY  I personally reviewed all radiographic images ordered to evaluate for the above acute complaints and reviewed radiology reports and findings.  These findings were personally discussed with the patient.  Please see medical record for radiology report.  ____________________________________________   PROCEDURES  Procedure(s) performed:  .Critical Care Performed by: Merlyn Lot, MD Authorized by: Merlyn Lot, MD   Critical care provider statement:    Critical care time (minutes):  40  Critical  care was necessary to treat or prevent imminent or life-threatening deterioration of the following conditions:  Respiratory failure   Critical care was time spent personally by me on the following activities:  Discussions with consultants, evaluation of patient's response to treatment, examination of patient, ordering and performing treatments and interventions, ordering and review of laboratory studies, ordering and review of radiographic studies, pulse oximetry, re-evaluation of patient's condition, obtaining history from patient or surrogate and review of old charts      Critical Care performed: yes ____________________________________________   INITIAL IMPRESSION / ASSESSMENT AND PLAN / ED COURSE  Pertinent labs & imaging results that were available during my care of the patient were reviewed by me and considered in my medical decision making (see chart for details).   DDX: Asthma, copd, CHF, pna, ptx, malignancy, Pe, anemia   TREVIONE DARKO is a 49 y.o. who presents to the ED with symptoms as described above.  Patient does describe some chest tightness and his exam is concerning for COPD exacerbation.  Does have some edema as well.  No worsening hypoxic respiratory failure as he is satting well on his baseline 2 L.  Have a lower suspicion for ACS.  He states he been compliant with his medications.  Will order blood work placed on monitor for above differential.  The patient will be placed on continuous pulse oximetry and telemetry for monitoring.  Laboratory evaluation will be sent to evaluate for the above complaints.     Clinical Course as of Jun 10 1547  Wed Jun 10, 2019  1435 Patient found to have evidence of acute hypercapnic respiratory failure with partial compensation.  He is currently mentating well but given his high PCO2 patient will require hospitalization.  I have ordered BiPAP will order steroids.  Still waiting urinalysis.  Have lower suspicion for acute infection     [PR]    Clinical Course User Index [PR] Merlyn Lot, MD    The patient was evaluated in Emergency Department today for the symptoms described in the history of present illness. He/she was evaluated in the context of the global COVID-19 pandemic, which necessitated consideration that the patient might be at risk for infection with the SARS-CoV-2 virus that causes COVID-19. Institutional protocols and algorithms that pertain to the evaluation of patients at risk for COVID-19 are in a state of rapid change based on information released by regulatory bodies including the CDC and federal and state organizations. These policies and algorithms were followed during the patient's care in the ED.  As part of my medical decision making, I reviewed the following data within the [place] notes reviewed and incorporated, Labs reviewed, notes from prior ED visits and [place] Controlled Substance Database   ____________________________________________   FINAL CLINICAL IMPRESSION(S) / ED DIAGNOSES  Final diagnoses:  Acute respiratory failure with hypercapnia (HCC)  Polyuria  COPD exacerbation ([place])      NEW MEDICATIONS STARTED DURING THIS VISIT:  New Prescriptions   No medications on file     Note:  This document was prepared using Dragon voice recognition software and may include unintentional dictation errors.    Merlyn Lot, MD 06/10/19 [phone]

## 2019-06-10 NOTE — ED Notes (Signed)
MD contacted regarding pt wishes to take bipap off. MD asked that pt be placed on [place] at this time to evaluate need to bipap. Pt placed on 6L [place] at this time.

## 2019-06-10 NOTE — ED Notes (Signed)
Pt maintaining oxygen sat of 97 on 6L [place] at this time. RR 30-34.

## 2019-06-10 NOTE — H&P (Signed)
History and Physical    [name] [id number] DOB: 02-10-1970 DOA: 06/10/2019  PCP: [place] Patient coming from: home   Chief Complaint: shortness of breath   HPI: 49 y/o M w/ PMH of COPD on 2L [place], sarcoidosis, current smoker, HTN, HLD, DM2, obesity, CHF, CVA, CAD who presents w/ shortness of breath x morning of admission. The shortness of breath is at rest as well as with exertion. Pt tried using his inhalers at home but had no improvement in his shortness of breath. Pt denies any recent sick contacts. Pt's pulmonologist is Dr. [name] who he saw a couple of weeks ago and no changes were made in his medications. Pt denies any fever, chills, sweating, CP, nausea, vomiting, abd pain, dysuria, urinary frequency, urinary urgency, diarrhea, or constipation. Of note, pt has hx of chronic cough.   Review of Systems: As per HPI otherwise 10 point review of systems negative.    Past Medical History:  Diagnosis Date  . CHF (congestive heart failure) ([place])   . COPD (chronic obstructive pulmonary disease) ([place])   . Coronary artery disease   . Diabetes mellitus without complication ([place])   . Hypercholesteremia unk  . Hypertension   . Myocardial infarction ([place])   . Sarcoidosis   . Sleep apnea    does not use CPAP regularly.  . Stroke [facility])     Past Surgical History:  Procedure Laterality Date  . APPENDECTOMY    . PARTIAL NEPHRECTOMY       reports that he has been smoking cigarettes. He has a 6.25 pack-year smoking history. He has never used smokeless tobacco. He reports current alcohol use. He reports current drug use. Drugs: Marijuana and PCP.   Allergies  Allergen Reactions  . Penicillins Anaphylaxis and Other (See Comments)    Has patient had a PCN reaction causing immediate rash, facial/tongue/throat swelling, SOB or lightheadedness with hypotension: Yes Has patient had a PCN reaction causing severe rash involving mucus membranes or skin necrosis:  No Has patient had a PCN reaction that required hospitalization: No Has patient had a PCN reaction occurring within the last 10 years: No If all of the above answers are "NO", then may proceed with Cephalosporin use.     Family History  Problem Relation Age of Onset  . Hypertension Mother   . Heart disease Mother   . Diabetes Mother   . Cancer Father     Prior to Admission medications   Medication Sig Start Date End Date Taking? Authorizing Provider  albuterol (VENTOLIN HFA) 108 (90 Base) MCG/ACT inhaler Inhale 1-2 puffs into the lungs every 4 (four) hours as needed for wheezing or shortness of breath.   Yes [provider]  apixaban (ELIQUIS) 5 MG TABS tablet Take 5 mg by mouth 2 (two) times daily. 06/11/16  Yes [provider]  aspirin 81 MG EC tablet Take 81 mg by mouth at bedtime.    Yes [provider]  atorvastatin (LIPITOR) 40 MG tablet Take 40 mg by mouth at bedtime.    Yes [provider]  Fluticasone-Salmeterol (ADVAIR) 250-50 MCG/DOSE AEPB Inhale 1 puff into the lungs 2 (two) times daily.   Yes [provider]  furosemide (LASIX) 80 MG tablet Take 1 tablet (80 mg total) by mouth 2 (two) times daily. 60 mg p.o. twice daily for 4 days , resume home dose thereafter. Patient taking differently: Take 40 mg by mouth daily.  11/21/18  Yes Epifanio Lesches, MD  insulin  detemir (LEVEMIR) 100 UNIT/ML injection Inject 40-50 Units into the skin at bedtime.    Yes [provider]  insulin lispro (HUMALOG) 100 UNIT/ML injection Inject 5-15 Units into the skin 3 (three) times daily with meals. Sliding scale as needed   Yes [provider]  ipratropium-albuterol (DUONEB) 0.5-2.5 (3) MG/3ML SOLN Inhale 3 mLs into the lungs 4 (four) times daily as needed for wheezing. 09/11/18  Yes [provider]  isosorbide dinitrate (ISORDIL) 10 MG tablet Take 10 mg by mouth 3 (three) times daily.   Yes [provider]   lisinopril (ZESTRIL) 30 MG tablet Take 30 mg by mouth at bedtime.    Yes [provider]  Melatonin 5 MG TABS Take 5 mg by mouth at bedtime as needed for sleep.   Yes [provider]  metoprolol tartrate (LOPRESSOR) 100 MG tablet Take 1 tablet (100 mg total) by mouth 2 (two) times daily. Patient taking differently: Take 100 mg by mouth at bedtime.  07/30/18  Yes Fritzi Mandes, MD  potassium chloride SA (KLOR-CON) 20 MEQ tablet Take 20 mEq by mouth at bedtime.    Yes [provider]  theophylline (THEODUR) 300 MG 12 hr tablet Take 300 mg by mouth 2 (two) times daily.    Yes [provider]  tiotropium (SPIRIVA) 18 MCG inhalation capsule Place 18 mcg into inhaler and inhale daily.   Yes [provider]    Physical Exam: Vitals:   06/10/19 1046 06/10/19 1334 06/10/19 1400 06/10/19 1455  BP: 134/89 (!) 149/92 (!) 142/90   Pulse: 83 77 75 78  Resp: 16 (!) 32 15 (!) 26  Temp: 98.7 F (37.1 C)     TempSrc: Oral     SpO2: 96% 98% 97% 96%  Height:        Constitutional: NAD, calm, comfortable Vitals:   06/10/19 1046 06/10/19 1334 06/10/19 1400 06/10/19 1455  BP: 134/89 (!) 149/92 (!) 142/90   Pulse: 83 77 75 78  Resp: 16 (!) 32 15 (!) 26  Temp: 98.7 F (37.1 C)     TempSrc: Oral     SpO2: 96% 98% 97% 96%  Height:       Eyes: PERRL, lids and conjunctivae normal ENMT: Mucous membranes are moist. Posterior pharynx clear of any exudate or lesions. Neck: normal, supple Respiratory: diminished breath sounds b/l. Scattered wheezes Cardiovascular: S1/S2+. No rubs / gallops.  B/l LE 1+ pitting edema  Abdomen: soft, no tenderness,obese. Hypoactive bowel sounds positive.  Musculoskeletal: no clubbing / cyanosis. Good ROM. Normal muscle tone.  Skin: no rashes, lesions, ulcers.  Neurologic: CN 2-12 grossly intact. Lethargic. Moves all 4 extremities  Psychiatric: Normal judgment and insight. Flat mood and affect.     Labs on Admission: I have  personally reviewed following labs and imaging studies  CBC: Recent Labs  Lab 06/10/19 1052  WBC 6.7  HGB 15.1  HCT 47.8  MCV 88.8  PLT 0000000   Basic Metabolic Panel: Recent Labs  Lab 06/10/19 1052  NA 140  K 4.1  CL 99  CO2 34*  GLUCOSE 144*  BUN 18  CREATININE 1.50*  CALCIUM 8.6*   GFR: CrCl cannot be calculated (Unknown ideal weight.). Liver Function Tests: Recent Labs  Lab 06/10/19 1052  AST 28  ALT 27  ALKPHOS 74  BILITOT 0.8  PROT 7.3  ALBUMIN 4.0   No results for input(s): LIPASE, AMYLASE in the last 168 hours. No results for input(s): AMMONIA in the last 168  hours. Coagulation Profile: No results for input(s): INR, PROTIME in the last 168 hours. Cardiac Enzymes: No results for input(s): CKTOTAL, CKMB, CKMBINDEX, TROPONINI in the last 168 hours. BNP (last 3 results) No results for input(s): PROBNP in the last 8760 hours. HbA1C: No results for input(s): HGBA1C in the last 72 hours. CBG: No results for input(s): GLUCAP in the last 168 hours. Lipid Profile: No results for input(s): CHOL, HDL, LDLCALC, TRIG, CHOLHDL, LDLDIRECT in the last 72 hours. Thyroid Function Tests: No results for input(s): TSH, T4TOTAL, FREET4, T3FREE, THYROIDAB in the last 72 hours. Anemia Panel: No results for input(s): VITAMINB12, FOLATE, FERRITIN, TIBC, IRON, RETICCTPCT in the last 72 hours. Urine analysis:    Component Value Date/Time   COLORURINE YELLOW (A) 07/22/2016 1520   APPEARANCEUR CLEAR (A) 07/22/2016 1520   APPEARANCEUR Hazy 06/01/2013 1139   LABSPEC 1.019 07/22/2016 1520   LABSPEC 1.030 06/01/2013 1139   PHURINE 6.0 07/22/2016 1520   GLUCOSEU NEGATIVE 07/22/2016 1520   GLUCOSEU Negative 06/01/2013 1139   HGBUR NEGATIVE 07/22/2016 1520   BILIRUBINUR NEGATIVE 07/22/2016 1520   BILIRUBINUR Negative 06/01/2013 1139   KETONESUR NEGATIVE 07/22/2016 1520   PROTEINUR NEGATIVE 07/22/2016 1520   NITRITE NEGATIVE 07/22/2016 1520   LEUKOCYTESUR NEGATIVE 07/22/2016  1520   LEUKOCYTESUR Negative 06/01/2013 1139    Radiological Exams on Admission: DG Chest 2 View  Result Date: 06/10/2019 CLINICAL DATA:  Chest pain, shortness of breath, history of sarcoidosis EXAM: CHEST - 2 VIEW COMPARISON:  11/16/2018 FINDINGS: Stable cardiomediastinal contours. Persistent elevation of the left hemidiaphragm. Extensive bilateral pleuroparenchymal scarring compatible with known history of sarcoidosis. No superimposed focal airspace opacity. No pleural effusion or pneumothorax. No acute osseous findings. IMPRESSION: 1. Chronic changes related to known sarcoidosis, similar to the previous exams. 2. No new superimposed acute cardiopulmonary process is evident. Electronically Signed   By: Davina Poke D.O.   On: 06/10/2019 11:17    EKG: Independently reviewed.   Assessment/Plan Active Problems:   * No active hospital problems. * COPD exacerbation: will start IV azithromycin, IV steroids. Continue on bronchodilators. Continue on BiPAP and wean as tolerated. Uses 2L [place] chronically at home. Encourage incentive spirometry and flutter valve  Chronic hypoxic & hypercarbic respiratory failure: likely secondary to sarcoidosis & COPD. Will continue on BiPAP and wean as tolerated. Uses 2L [place] chronically at home   Hx of sarcoidosis: continue on home dose of theophylline  Smoker: smokes 5 cigs a day x 21 years. Smoking cessation counseling. Unable to tolerate nicotine patch or nicotine gum   AKI vs CKD: baseline Cr/ GFR is unknown. Cr is trending down slightly from Cr in 2020 which 1.52. Will hold home dose of lasix secondary to AKI & reassess in AM. Will continue to monitor  HTN: will continue on home dose of metoprolol, isosorbide mononitrate. Will hold home dose of lisinopril secondary to AKI. Hydralazine prn   DM2: will start on SSI w/ accuchecks and continue on levemir but at a reduced dose  HLD: will continue on statin   Elevated troponins: trending down. Likely secondary  to demand ischemia.     DVT prophylaxis: eliquis  Code Status: full  Family Communication:  Disposition Plan: likely d/c back home Consults called: none Admission status: inpatient    Wyvonnia Dusky MD [facility] Pager [phone]   If 7PM-7AM, please contact night-coverage [email or web address]   06/10/2019, 3:10 PM

## 2019-06-10 NOTE — ED Triage Notes (Signed)
C/O frequent urination x 2-3 hours, also c/o SOB and chest tightness.  Wears home oxygen 2;/ [place]

## 2019-06-11 DIAGNOSIS — J9621 Acute and chronic respiratory failure with hypoxia: Secondary | ICD-10-CM | POA: Diagnosis not present

## 2019-06-11 DIAGNOSIS — J441 Chronic obstructive pulmonary disease with (acute) exacerbation: Principal | ICD-10-CM

## 2019-06-11 LAB — BASIC METABOLIC PANEL
Anion gap: 7 (ref 5–15)
BUN: 19 mg/dL (ref 6–20)
CO2: 36 mmol/L — ABNORMAL HIGH (ref 22–32)
Calcium: 8.7 mg/dL — ABNORMAL LOW (ref 8.9–10.3)
Chloride: 98 mmol/L (ref 98–111)
Creatinine, Ser: 1.45 mg/dL — ABNORMAL HIGH (ref 0.61–1.24)
GFR calc Af Amer: >60 mL/min (ref >60)
GFR calc non Af Amer: 56 mL/min — ABNORMAL LOW (ref >60)
Glucose, Bld: 167 mg/dL — ABNORMAL HIGH (ref 70–99)
Potassium: 5.1 mmol/L (ref 3.5–5.1)
Sodium: 141 mmol/L (ref 135–145)

## 2019-06-11 LAB — CBC
HCT: 47.6 % (ref 39.0–52.0)
Hemoglobin: 15.2 g/dL (ref 13.0–17.0)
MCH: 28 pg (ref 26.0–34.0)
MCHC: 31.9 g/dL (ref 30.0–36.0)
MCV: 87.8 fL (ref 80.0–100.0)
Platelets: 208 10*3/uL (ref 150–400)
RBC: 5.42 MIL/uL (ref 4.22–5.81)
RDW: 13 % (ref 11.5–15.5)
WBC: 9.5 10*3/uL (ref 4.0–10.5)
nRBC: 0 % (ref 0.0–0.2)

## 2019-06-11 LAB — GLUCOSE, CAPILLARY
Glucose-Capillary: 146 mg/dL — ABNORMAL HIGH (ref 70–99)
Glucose-Capillary: 289 mg/dL — ABNORMAL HIGH (ref 70–99)
Glucose-Capillary: 159 mg/dL — ABNORMAL HIGH (ref 70–99)
Glucose-Capillary: 201 mg/dL — ABNORMAL HIGH (ref 70–99)

## 2019-06-11 LAB — MRSA PCR SCREENING: MRSA by PCR: NEGATIVE

## 2019-06-11 LAB — HEMOGLOBIN A1C
Hgb A1c MFr Bld: 7 % — ABNORMAL HIGH (ref 4.8–5.6)
Mean Plasma Glucose: 154.2 mg/dL

## 2019-06-11 MED ORDER — ALBUTEROL SULFATE (2.5 MG/3ML) 0.083% IN NEBU: 2.5000 mg | INHALATION_SOLUTION | RESPIRATORY_TRACT | Status: DC | PRN

## 2019-06-11 MED ORDER — IPRATROPIUM-ALBUTEROL 0.5-2.5 (3) MG/3ML IN SOLN
3.0000 mL | Freq: Three times a day (TID) | RESPIRATORY_TRACT | Status: DC
  Administered 2019-06-11 – 2019-06-12 (×3): 3 mL via RESPIRATORY_TRACT
  Filled 2019-06-11 (×3): qty 3

## 2019-06-11 NOTE — Consult Note (Signed)
Name: [name] MRN: [id number] DOB: 1970-11-12    ADMISSION DATE:  06/10/2019 CONSULTATION DATE:  06/10/2019  REFERRING MD :  Sharion Settler, NP  CHIEF COMPLAINT:  Shortness of Breath  BRIEF PATIENT DESCRIPTION:  49 y.o. Male admitted 06/10/19 with Acute on Chronic Hypoxic Hypercapnic Respiratory Failure secondary to COPD Exacerbation & Sarcoidosis requiring BiPAP.  SIGNIFICANT EVENTS  5/5: Admission to Stepdown 5/5: PCCM consulted due to worsening SOB  STUDIES:  5/5: CXR>>1. Chronic changes related to known sarcoidosis, similar to the previous exams. 2. No new superimposed acute cardiopulmonary process is evident.  CULTURES: SARS-CoV-2 PCR 5/5>> negative  ANTIBIOTICS: Azithromycin 5/5>>  HISTORY OF PRESENT ILLNESS:   [name] is a 49 year old male with a past medical history significant for COPD on 2L supplemental O2, sarcoidosis, current smoker, CHF, hypertension, hyperlipidemia, diabetes mellitus type 2, obesity, CVA, CAD who presented to [facility] ED on 06/10/2019 with complaints of shortness of breath and chest tightness.  Patient reported the shortness of breath at rest as well as on exertion.  He tried using his home inhalers but had no improvement in his symptoms.  He denied chest pain, fever, chills, abdominal pain, nausea, vomiting, dysuria, or sick contacts.  Of note he sees pulmonologist Dr. [name] as an outpatient, which she saw him a couple weeks ago with no changes made to his medications.   Initial work-up in the ED revealed bicarb 34, glucose 144, creatinine 1.5, high-sensitivity troponin 34, BNP 75, WBC 6.7.  Chest x-ray with chronic changes secondary to sarcoidosis, with no superimposed acute cardiopulmonary process.  His SARS-CoV-2 PCR and influenza PCR were both negative.  Urinalysis and urine drug screen are both negative.  Venous blood gas with pH 7.24 /CO2 94 /O2 42/bicarb 40.3.  Given his Hypercapnia and work of breathing, he was placed on BiPAP.  He was to  be admitted to the [facility] unit by the Hospitalist for further workup and treatment of acute on chronic hypoxic hypercapnic respiratory failure secondary to COPD exacerbation and sarcoidosis.  While awaiting bed placement in the ED he requested to be taken off BiPAP.  He was placed on 6 L, and it was noted later in the shift he began to exhibit tachypnea with respiratory rate in the 30s.  He was subsequently placed back on BiPAP.  Given his pulmonary history and need for BiPAP, PCCM was consulted.  After arrival to the stepdown unit patient has again been weaned down to nasal cannula and is in no acute respiratory distress with stable O2 saturations.  PAST MEDICAL HISTORY :   has a past medical history of CHF (congestive heart failure) ([place]), COPD (chronic obstructive pulmonary disease) ([place]), Coronary artery disease, Diabetes mellitus without complication ([place]), Hypercholesteremia ([place]), Hypertension, Myocardial infarction [facility]), Sarcoidosis, Sleep apnea, and Stroke ([place]).  has a past surgical history that includes Appendectomy and Partial nephrectomy. Prior to Admission medications   Medication Sig Start Date End Date Taking? Authorizing Provider  albuterol (VENTOLIN HFA) 108 (90 Base) MCG/ACT inhaler Inhale 1-2 puffs into the lungs every 4 (four) hours as needed for wheezing or shortness of breath.   Yes [provider]  apixaban (ELIQUIS) 5 MG TABS tablet Take 5 mg by mouth 2 (two) times daily. 06/11/16  Yes [provider]  aspirin 81 MG EC tablet Take 81 mg by mouth at bedtime.    Yes [provider]  atorvastatin (LIPITOR) 40 MG tablet Take 40 mg by mouth at bedtime.    Yes [provider]  Fluticasone-Salmeterol (ADVAIR) 250-50 MCG/DOSE AEPB Inhale 1 puff into the lungs 2 (two) times daily.   Yes [provider]  furosemide (LASIX) 80 MG tablet Take 1 tablet (80 mg total) by mouth 2 (two) times daily. 60 mg p.o. twice daily for 4 days , resume home  dose thereafter. Patient taking differently: Take 40 mg by mouth daily.  11/21/18  Yes Epifanio Lesches, MD  insulin detemir (LEVEMIR) 100 UNIT/ML injection Inject 40-50 Units into the skin at bedtime.    Yes [provider]  insulin lispro (HUMALOG) 100 UNIT/ML injection Inject 5-15 Units into the skin 3 (three) times daily with meals. Sliding scale as needed   Yes [provider]  ipratropium-albuterol (DUONEB) 0.5-2.5 (3) MG/3ML SOLN Inhale 3 mLs into the lungs 4 (four) times daily as needed for wheezing. 09/11/18  Yes [provider]  isosorbide dinitrate (ISORDIL) 10 MG tablet Take 10 mg by mouth 3 (three) times daily.   Yes [provider]  lisinopril (ZESTRIL) 30 MG tablet Take 30 mg by mouth at bedtime.    Yes [provider]  Melatonin 5 MG TABS Take 5 mg by mouth at bedtime as needed for sleep.   Yes [provider]  metoprolol tartrate (LOPRESSOR) 100 MG tablet Take 1 tablet (100 mg total) by mouth 2 (two) times daily. Patient taking differently: Take 100 mg by mouth at bedtime.  07/30/18  Yes Fritzi Mandes, MD  potassium chloride SA (KLOR-CON) 20 MEQ tablet Take 20 mEq by mouth at bedtime.    Yes [provider]  theophylline (THEODUR) 300 MG 12 hr tablet Take 300 mg by mouth 2 (two) times daily.    Yes [provider]  tiotropium (SPIRIVA) 18 MCG inhalation capsule Place 18 mcg into inhaler and inhale daily.   Yes [provider]   Allergies  Allergen Reactions  . Penicillins Anaphylaxis and Other (See Comments)    Has patient had a PCN reaction causing immediate rash, facial/tongue/throat swelling, SOB or lightheadedness with hypotension: Yes Has patient had a PCN reaction causing severe rash involving mucus membranes or skin necrosis: No Has patient had a PCN reaction that required hospitalization: No Has patient had a PCN reaction occurring within the last 10 years: No If all of the above answers are  "NO", then may proceed with Cephalosporin use.     FAMILY HISTORY:  family history includes Cancer in his father; Diabetes in his mother; Heart disease in his mother; Hypertension in his mother. SOCIAL HISTORY:  reports that he has been smoking cigarettes. He has a 6.25 pack-year smoking history. He has never used smokeless tobacco. He reports current alcohol use. He reports current drug use. Drugs: Marijuana and PCP.   COVID-19 DISASTER DECLARATION:  FULL CONTACT PHYSICAL EXAMINATION WAS NOT POSSIBLE DUE TO TREATMENT OF COVID-19 AND  CONSERVATION OF PERSONAL PROTECTIVE EQUIPMENT, LIMITED EXAM FINDINGS INCLUDE-  Patient assessed or the symptoms described in the history of present illness.  In the context of the Global COVID-19 pandemic, which necessitated consideration that the patient might be at risk for infection with the SARS-CoV-2 virus that causes COVID-19, Institutional protocols and algorithms that pertain to the evaluation of patients at risk for COVID-19 are in a state of rapid change based on information released by regulatory bodies including the CDC and federal and state organizations. These policies and algorithms were followed during the patient's care while in hospital.  REVIEW OF SYSTEMS:  Positives in BOLD Constitutional: Negative for fever,  chills, weight loss, malaise/fatigue and diaphoresis.  HENT: Negative for hearing loss, ear pain, nosebleeds, congestion, sore throat, neck pain, tinnitus and ear discharge.   Eyes: Negative for blurred vision, double vision, photophobia, pain, discharge and redness.  Respiratory: Negative for cough, hemoptysis, sputum production, +shortness of breath, +wheezing and stridor.   Cardiovascular: Negative for chest pain, palpitations, orthopnea, claudication, leg swelling and PND.  Gastrointestinal: Negative for heartburn, nausea, vomiting, abdominal pain, diarrhea, constipation, blood in stool and melena.  Genitourinary: Negative for  dysuria, urgency, frequency, hematuria and flank pain.  Musculoskeletal: Negative for myalgias, back pain, joint pain and falls.  Skin: Negative for itching and rash.  Neurological: Negative for dizziness, tingling, tremors, sensory change, speech change, focal weakness, seizures, loss of consciousness, weakness and headaches.  Endo/Heme/Allergies: Negative for environmental allergies and polydipsia. Does not bruise/bleed easily.  SUBJECTIVE:  Pt reports shortness of breath and wheezing Denies chest pain, cough, abdominal pain, N/V/D, fever/chills, edema On 6L [place]  VITAL SIGNS: Temp:  [97.5 F (36.4 C)-98.7 F (37.1 C)] 97.5 F (36.4 C) (05/05 2200) Pulse Rate:  [65-87] 75 (05/05 2300) Resp:  [15-32] 22 (05/05 2300) BP: (116-149)/(86-100) 133/90 (05/05 2300) SpO2:  [87 %-98 %] 93 % (05/05 2300) Weight:  [129.3 kg] 129.3 kg (05/05 1528)  PHYSICAL EXAMINATION: General: Acute on chronically ill-appearing obese male, sitting in bed, on 6 L nasal cannula, no acute distress Neuro: Sleeping, arouses to voice, oriented x4, follows commands, no focal deficits, speech clear HEENT: Atraumatic, normocephalic, neck supple, no JVD Cardiovascular: Regular rate and rhythm, S1-S2, no murmurs, rubs, gallops, 2+ pulses Lungs: Clear diminished to auscultation bilaterally, even, mild tachypnea Abdomen: Obese, soft, nontender, nondistended, no guarding or rebound tenderness, bowel sounds positive x4 Musculoskeletal: Normal bulk and tone, no deformities, 1+ bilateral lower extremity edema Skin: Warm and dry.  No obvious rashes, lesions, ulcerations  Recent Labs  Lab 06/10/19 1052  NA 140  K 4.1  CL 99  CO2 34*  BUN 18  CREATININE 1.50*  GLUCOSE 144*   Recent Labs  Lab 06/10/19 1052  HGB 15.1  HCT 47.8  WBC 6.7  PLT 193   DG Chest 2 View  Result Date: 06/10/2019 CLINICAL DATA:  Chest pain, shortness of breath, history of sarcoidosis EXAM: CHEST - 2 VIEW COMPARISON:  11/16/2018 FINDINGS:  Stable cardiomediastinal contours. Persistent elevation of the left hemidiaphragm. Extensive bilateral pleuroparenchymal scarring compatible with known history of sarcoidosis. No superimposed focal airspace opacity. No pleural effusion or pneumothorax. No acute osseous findings. IMPRESSION: 1. Chronic changes related to known sarcoidosis, similar to the previous exams. 2. No new superimposed acute cardiopulmonary process is evident. Electronically Signed   By: Davina Poke D.O.   On: 06/10/2019 11:17    ASSESSMENT / PLAN:  Acute on chronic Hypoxic Hypercapnic respiratory failure secondary to COPD Exacerbation & Sarcoidosis  Hx: COPD on 2L [place], Sarcoidosis, OSA, current smoker -Supplemental O2 as needed to maintain O2 saturations 88 to 92% -BiPAP, wean as tolerated ~ currently weaned off -Follow intermittent chest x-ray and ABG as needed -Bronchodilators -IV steroids -Continue Azithromycin -Continue Dulera -Continue home theophylline -Encourage smoking cessation  Hypertension Elevated Troponin, suspect demand ischemia -Continuous cardiac monitoring -Maintain MAP >65 -Continue home Metoprolol, Imdur -Hold home Lisinopril & Lasix due to AKI -PRN Hydralazine -Trend troponin until downtrending  AKI -Monitor I&O's / urinary output -Follow BMP -Ensure adequate renal perfusion -Avoid nephrotoxic agents as able -Replace electrolytes as indicated  Diabetes Mellitus Type II  -CBG's -SSI -Follow ICU Hypo/hyperglycemia  protocol     BEST PRACTICES: DISPOSITION: Stepdown GOALS OF CARE: Full Code VTE PROPHYLAXIS/ANTICOAGULATION: Eliquis UPDATES: Updated pt at bedside 06/11/19  Darel Hong, AGACNP-BC [facility] Pager: [phone]  06/11/2019, 12:12 AM

## 2019-06-11 NOTE — Progress Notes (Signed)
PROGRESS NOTE    [name]  [id number] DOB: October 12, 1970 DOA: 06/10/2019 PCP: Christopher    Brief Narrative:  HPI: 49 y/o M w/ PMH of COPD on 2L [place], sarcoidosis, current smoker, HTN, HLD, DM2, obesity, CHF, CVA, CAD who presents w/ shortness of breath x morning of admission. The shortness of breath is at rest as well as with exertion. Pt tried using his inhalers at home but had no improvement in his shortness of breath. Pt denies any recent sick contacts. Pt's pulmonologist is Dr. [name] who he saw a couple of weeks ago and no changes were made in his medications. Pt denies any fever, chills, sweating, CP, nausea, vomiting, abd pain, dysuria, urinary frequency, urinary urgency, diarrhea, or constipation. Of note, pt has hx of chronic cough.   5/6: Seen and examined in stepdown unit.  Respiratory status improved.  Weaned off of noninvasive positive pressure ventilation.  Saturating well on 4 to 5 L nasal cannula.  Shortness of breath improving.  work of breathing also improved.   Assessment & Plan:   Active Problems:   COPD exacerbation (HCC)   Acute on chronic respiratory failure with hypoxia (HCC)  COPD exacerbation Improving over interval Plan: Continue corticosteroids, wean as oxygen improves Bronchodilators IV azithromycin Dulera Theophylline Smoking cessation encouraged  Chronic hypoxic & hypercarbic respiratory failure  likely secondary to sarcoidosis & COPD.  Wean from BiPAP Plan: Continue with treatment above for COPD exacerbation  Hx of sarcoidosis  continue on home dose of theophylline  Tobacco abuse  smokes 5 cigs a day x 21 years.  Smoking cessation counseling.  Unable to tolerate nicotine patch or nicotine gum   AKI vs CKD  baseline Cr/ GFR is unknown.  Cr is trending down slightly from Cr in 2020 which 1.52.  Will hold home dose of lasix secondary to AKI & reassess in AM.  Will continue to monitor  HTN: will continue on  home dose of metoprolol, isosorbide mononitrate. Will hold home dose of lisinopril secondary to AKI. Hydralazine prn   DM2: will start on SSI w/ accuchecks and continue on levemir but at a reduced dose  HLD: will continue on statin   Elevated troponins: trending down. Likely secondary to demand ischemia.    DVT prophylaxis: Lovenox Code Status: Full Family Communication: None today Disposition Plan: Status is: Inpatient  Remains inpatient appropriate because:Inpatient level of care appropriate due to severity of illness   Dispo: The patient is from: Home              Anticipated d/c is to: Home              Anticipated d/c date is: 2 days              Patient currently is not medically stable to d/c.   Hypoxic respiratory failure started on treatment goal.  Continue with IV steroids, bronchodilators, antibiotics.      Consultants:   ICU  Procedures:   NIPPV  Antimicrobials:   Azithromycin   Subjective: Seen and examined Respiratory status improved Normal work of breathing  Objective: Vitals:   06/11/19 1000 06/11/19 1100 06/11/19 1157 06/11/19 1347  BP: (!) 148/96 (!) 136/91    Pulse: 74 79 74 76  Resp: (!) 21 (!) 30 19 (!) 24  Temp:   97.7 F (36.5 C)   TempSrc:   Oral   SpO2: 92% 91% 91% 94%  Weight:      Height:  Intake/Output Summary (Last 24 hours) at 06/11/2019 1511 Last data filed at 06/11/2019 1157 Gross per 24 hour  Intake --  Output 730 ml  Net -730 ml   Filed Weights   06/10/19 1528  Weight: 129.3 kg    Examination:  General exam: Appears calm and comfortable  Respiratory system: Air entry decreased bilaterally.  Normal work of breathing.  Scattered wheeze Cardiovascular system: S1 & S2 heard, RRR. No JVD, murmurs, rubs, gallops or clicks. No pedal edema. Gastrointestinal system: Abdomen is nondistended, soft and nontender. No organomegaly or masses felt. Normal bowel sounds heard. Central nervous system: Alert and  oriented. No focal neurological deficits. Extremities: Symmetric 5 x 5 power. Skin: No rashes, lesions or ulcers Psychiatry: Judgement and insight appear normal. Mood & affect appropriate.     Data Reviewed: I have personally reviewed following labs and imaging studies  CBC: Recent Labs  Lab 06/10/19 1052 06/11/19 0454  WBC 6.7 9.5  HGB 15.1 15.2  HCT 47.8 47.6  MCV 88.8 87.8  PLT 193 [id number]   Basic Metabolic Panel: Recent Labs  Lab 06/10/19 1052 06/11/19 0454  NA 140 141  K 4.1 5.1  CL 99 98  CO2 34* 36*  GLUCOSE 144* 167*  BUN 18 19  CREATININE 1.50* 1.45*  CALCIUM 8.6* 8.7*   GFR: Estimated Creatinine Clearance: 85.7 mL/min (A) (by C-G formula based on SCr of 1.45 mg/dL (H)). Liver Function Tests: Recent Labs  Lab 06/10/19 1052  AST 28  ALT 27  ALKPHOS 74  BILITOT 0.8  PROT 7.3  ALBUMIN 4.0   No results for input(s): LIPASE, AMYLASE in the last 168 hours. No results for input(s): AMMONIA in the last 168 hours. Coagulation Profile: No results for input(s): INR, PROTIME in the last 168 hours. Cardiac Enzymes: No results for input(s): CKTOTAL, CKMB, CKMBINDEX, TROPONINI in the last 168 hours. BNP (last 3 results) No results for input(s): PROBNP in the last 8760 hours. HbA1C: Recent Labs    06/11/19 0454  HGBA1C 7.0*   CBG: Recent Labs  Lab 06/10/19 2057 06/11/19 0721 06/11/19 1119  GLUCAP 155* 146* 289*   Lipid Profile: No results for input(s): CHOL, HDL, LDLCALC, TRIG, CHOLHDL, LDLDIRECT in the last 72 hours. Thyroid Function Tests: No results for input(s): TSH, T4TOTAL, FREET4, T3FREE, THYROIDAB in the last 72 hours. Anemia Panel: No results for input(s): VITAMINB12, FOLATE, FERRITIN, TIBC, IRON, RETICCTPCT in the last 72 hours. Sepsis Labs: No results for input(s): PROCALCITON, LATICACIDVEN in the last 168 hours.  Recent Results (from the past 240 hour(s))  Respiratory Panel by RT PCR (Flu A&B, Covid) - Nasopharyngeal Swab     Status:  None   Collection Time: 06/10/19  2:55 PM   Specimen: Nasopharyngeal Swab  Result Value Ref Range Status   SARS Coronavirus 2 by RT PCR NEGATIVE NEGATIVE Final    Comment: (NOTE) SARS-CoV-2 target nucleic acids are NOT DETECTED. The SARS-CoV-2 RNA is generally detectable in upper respiratoy specimens during the acute phase of infection. The lowest concentration of SARS-CoV-2 viral copies this assay can detect is 131 copies/mL. A negative result does not preclude SARS-Cov-2 infection and should not be used as the sole basis for treatment or other patient management decisions. A negative result may occur with  improper specimen collection/handling, submission of specimen other than nasopharyngeal swab, presence of viral mutation(s) within the areas targeted by this assay, and inadequate number of viral copies (<131 copies/mL). A negative result must be combined with clinical observations, patient history,  and epidemiological information. The expected result is Negative. Fact Sheet for Patients:  PinkCheek.be Fact Sheet for Healthcare Providers:  GravelBags.it This test is not yet ap proved or cleared by the [place] FDA and  has been authorized for detection and/or diagnosis of SARS-CoV-2 by FDA under an Emergency Use Authorization (EUA). This EUA will remain  in effect (meaning this test can be used) for the duration of the COVID-19 declaration under Section 564(b)(1) of the Act, 21 U.S.C. section 360bbb-3(b)(1), unless the authorization is terminated or revoked sooner.    Influenza A by PCR NEGATIVE NEGATIVE Final   Influenza B by PCR NEGATIVE NEGATIVE Final    Comment: (NOTE) The Xpert Xpress SARS-CoV-2/FLU/RSV assay is intended as an aid in  the diagnosis of influenza from Nasopharyngeal swab specimens and  should not be used as a sole basis for treatment. Nasal washings and  aspirates are unacceptable for Xpert  Xpress SARS-CoV-2/FLU/RSV  testing. Fact Sheet for Patients: PinkCheek.be Fact Sheet for Healthcare Providers: GravelBags.it This test is not yet approved or cleared by the [place] FDA and  has been authorized for detection and/or diagnosis of SARS-CoV-2 by  FDA under an Emergency Use Authorization (EUA). This EUA will remain  in effect (meaning this test can be used) for the duration of the  Covid-19 declaration under Section 564(b)(1) of the Act, 21  U.S.C. section 360bbb-3(b)(1), unless the authorization is  terminated or revoked. Performed at [facility], [address] [place], [place] [address]   MRSA PCR Screening     Status: Abnormal   Collection Time: 06/10/19  8:52 PM   Specimen: Nasal Mucosa; Nasopharyngeal  Result Value Ref Range Status   MRSA by PCR (A) NEGATIVE Final    INVALID, UNABLE TO DETERMINE THE PRESENCE OF TARGET DUE TO SPECIMEN INTEGRITY. RECOLLECTION REQUESTED.    Comment: Carver Fila RN [phone] 06/10/19 HNM  Performed at [facility], [address] [place], [place] [address]   MRSA PCR Screening     Status: None   Collection Time: 06/11/19 12:15 AM  Result Value Ref Range Status   MRSA by PCR NEGATIVE NEGATIVE Final    Comment:        The GeneXpert MRSA Assay (FDA approved for NASAL specimens only), is one component of a comprehensive MRSA colonization surveillance program. It is not intended to diagnose MRSA infection nor to guide or monitor treatment for MRSA infections. Performed at [facility], [address], [place], [place] [address]          Radiology Studies: DG Chest 2 View  Result Date: 06/10/2019 CLINICAL DATA:  Chest pain, shortness of breath, history of sarcoidosis EXAM: CHEST - 2 VIEW COMPARISON:  11/16/2018 FINDINGS: Stable cardiomediastinal contours. Persistent elevation of the left hemidiaphragm. Extensive bilateral  pleuroparenchymal scarring compatible with known history of sarcoidosis. No superimposed focal airspace opacity. No pleural effusion or pneumothorax. No acute osseous findings. IMPRESSION: 1. Chronic changes related to known sarcoidosis, similar to the previous exams. 2. No new superimposed acute cardiopulmonary process is evident. Electronically Signed   By: Davina Poke D.O.   On: 06/10/2019 11:17        Scheduled Meds: . apixaban  5 mg Oral BID  . aspirin EC  81 mg Oral Daily  . atorvastatin  40 mg Oral Daily  . Chlorhexidine Gluconate Cloth  6 each Topical Daily  . insulin aspart  0-15 Units Subcutaneous TID WC  . insulin detemir  20 Units Subcutaneous  QHS  . ipratropium-albuterol  3 mL Nebulization TID  . isosorbide mononitrate  10 mg Oral TID  . methylPREDNISolone (SOLU-MEDROL) injection  40 mg Intravenous Q8H  . metoprolol tartrate  100 mg Oral BID  . mometasone-formoterol  2 puff Inhalation BID  . sodium chloride flush  3 mL Intravenous Once  . sodium chloride flush  3 mL Intravenous Q12H  . theophylline  300 mg Oral BID   Continuous Infusions: . azithromycin Stopped (06/10/19 1930)     LOS: 1 day    Time spent: 35 minutes    Sidney Ace, MD [facility] Pager [phone]  If 7PM-7AM, please contact night-coverage 06/11/2019, 3:11 PM

## 2019-06-11 NOTE — Progress Notes (Signed)
Pt received to room 256 PCU from ICU at this time.  Pt oriented to room and call bell.  See assessment and vs's.  NSR BBB.  Pt denies pain or distress.  Pt only asking for something to eat at this time.  BG 201.

## 2019-06-11 NOTE — Progress Notes (Signed)
Assumed care from [place], [employer], at 1500.  Pt A&O x 4, ambulatory in room, 5 liters O2, no beds on PCU at this time.

## 2019-06-12 LAB — GLUCOSE, CAPILLARY
Glucose-Capillary: 250 mg/dL — ABNORMAL HIGH (ref 70–99)
Glucose-Capillary: 299 mg/dL — ABNORMAL HIGH (ref 70–99)
Glucose-Capillary: 222 mg/dL — ABNORMAL HIGH (ref 70–99)
Glucose-Capillary: 322 mg/dL — ABNORMAL HIGH (ref 70–99)

## 2019-06-12 MED ORDER — IPRATROPIUM-ALBUTEROL 0.5-2.5 (3) MG/3ML IN SOLN
3.0000 mL | Freq: Four times a day (QID) | RESPIRATORY_TRACT | Status: DC
  Administered 2019-06-12 – 2019-06-14 (×8): 3 mL via RESPIRATORY_TRACT
  Filled 2019-06-12 (×9): qty 3

## 2019-06-12 MED ORDER — METHYLPREDNISOLONE SODIUM SUCC 40 MG IJ SOLR
40.0000 mg | Freq: Two times a day (BID) | INTRAMUSCULAR | Status: AC
  Administered 2019-06-12 – 2019-06-13 (×3): 40 mg via INTRAVENOUS
  Filled 2019-06-12 (×3): qty 1

## 2019-06-12 MED ORDER — BUDESONIDE 0.25 MG/2ML IN SUSP
0.2500 mg | Freq: Two times a day (BID) | RESPIRATORY_TRACT | Status: DC
  Administered 2019-06-12 – 2019-06-14 (×5): 0.25 mg via RESPIRATORY_TRACT
  Filled 2019-06-12 (×4): qty 2

## 2019-06-12 MED ORDER — ARFORMOTEROL TARTRATE 15 MCG/2ML IN NEBU
15.0000 ug | INHALATION_SOLUTION | Freq: Two times a day (BID) | RESPIRATORY_TRACT | Status: DC
  Administered 2019-06-12 – 2019-06-14 (×5): 15 ug via RESPIRATORY_TRACT
  Filled 2019-06-12 (×6): qty 2

## 2019-06-12 NOTE — Progress Notes (Signed)
PROGRESS NOTE    [name]  [id number] DOB: Aug 25, 1970 DOA: 06/10/2019 PCP: [facility]    Brief Narrative:  HPI: 49 y/o M w/ PMH of COPD on 2L [place], sarcoidosis, current smoker, HTN, HLD, DM2, obesity, CHF, CVA, CAD who presents w/ shortness of breath x morning of admission. The shortness of breath is at rest as well as with exertion. Pt tried using his inhalers at home but had no improvement in his shortness of breath. Pt denies any recent sick contacts. Pt's pulmonologist is Dr. [name] who he saw a couple of weeks ago and no changes were made in his medications. Pt denies any fever, chills, sweating, CP, nausea, vomiting, abd pain, dysuria, urinary frequency, urinary urgency, diarrhea, or constipation. Of note, pt has hx of chronic cough.   5/6: Seen and examined in stepdown unit.  Respiratory status improved.  Weaned off of noninvasive positive pressure ventilation.  Saturating well on 4 to 5 L nasal cannula.  Shortness of breath improving.  work of breathing also improved.  5/7: Seen and examined.  Transferred to PCU.  Respiratory status continues to improve slowly.  Remains on 4 L nasal cannula.  Baseline requirement 2 L.  Weaned off BiPAP.  Still with some cough and difficulty with secretions otherwise work of breathing improved.   Assessment & Plan:   Active Problems:   COPD exacerbation (HCC)   Acute on chronic respiratory failure with hypoxia (HCC)  COPD exacerbation Improving over interval Plan: Continue Solu-Medrol 40 mg IV every 12 hours Scheduled DuoNebs every 6 hours Scheduled Brovana Scheduled Pulmicort Hold home Dulera Continue theophylline Completed azithromycin Smoking cessation encouraged  Chronic hypoxic & hypercarbic respiratory failure likely secondary to sarcoidosis & COPD.  Weaned from BiPAP Plan: Continue with treatment above for COPD exacerbation  Hx of sarcoidosis continue on home dose of theophylline  Tobacco abuse  smokes 5 cigs a day x 21 years.  Smoking cessation counseling.  Unable to tolerate nicotine patch or nicotine gum   AKI vs CKD  baseline Cr/ GFR is unknown.  Cr is trending down slightly from Cr in 2020 which 1.52.  Will hold home dose of lasix secondary to AKI & reassess in AM.  Will continue to monitor  HTN: will continue on home dose of metoprolol, isosorbide mononitrate. Will hold home dose of lisinopril secondary to AKI. Hydralazine prn   DM2: will start on SSI w/ accuchecks and continue on levemir but at a reduced dose  HLD: will continue on statin   Elevated troponins: trending down. Likely secondary to demand ischemia.    DVT prophylaxis: Lovenox Code Status: Full Family Communication: None today, offered to call, patient declined Disposition Plan: Status is: Inpatient  Remains inpatient appropriate because:Inpatient level of care appropriate due to severity of illness   Dispo: The patient is from: Home              Anticipated d/c is to: Home              Anticipated d/c date is: 1 day              Patient currently is not medically stable to d/c.   Patient improving but still with acute on chronic hypoxic respiratory failure.  Continue would like to wean back down to baseline 2 L of oxygen prior to discharge.  Has been improving.  Tentative plan on discharge home 06/13/2019   Consultants:   ICU  Procedures:   NIPPV  Antimicrobials:  Azithromycin (stopped 5/7)   Subjective: Seen and examined Respiratory status improved Normal work of breathing  Objective: Vitals:   06/11/19 2300 06/12/19 0334 06/12/19 0750 06/12/19 1154  BP:  125/72 118/74 125/78  Pulse:  63 69 65  Resp: (!) 29 20 17 18   Temp:  97.8 F (36.6 C) 98.4 F (36.9 C) 98.4 F (36.9 C)  TempSrc:  Oral    SpO2:  100% 96% 96%  Weight:  128.5 kg    Height:        Intake/Output Summary (Last 24 hours) at 06/12/2019 1259 Last data filed at 06/12/2019 0953 Gross per 24 hour  Intake  468.78 ml  Output 1225 ml  Net -756.22 ml   Filed Weights   06/10/19 1528 06/11/19 2108 06/12/19 0334  Weight: 129.3 kg 127.9 kg 128.5 kg    Examination:  General exam: Appears calm and comfortable  Respiratory system: Air entry decreased bilaterally.  Normal work of breathing.  Scattered wheeze Cardiovascular system: S1 & S2 heard, RRR. No JVD, murmurs, rubs, gallops or clicks. No pedal edema. Gastrointestinal system: Abdomen is nondistended, soft and nontender. No organomegaly or masses felt. Normal bowel sounds heard. Central nervous system: Alert and oriented. No focal neurological deficits. Extremities: Symmetric 5 x 5 power. Skin: No rashes, lesions or ulcers Psychiatry: Judgement and insight appear normal. Mood & affect appropriate.     Data Reviewed: I have personally reviewed following labs and imaging studies  CBC: Recent Labs  Lab 06/10/19 1052 06/11/19 0454  WBC 6.7 9.5  HGB 15.1 15.2  HCT 47.8 47.6  MCV 88.8 87.8  PLT 193 [id number]   Basic Metabolic Panel: Recent Labs  Lab 06/10/19 1052 06/11/19 0454  NA 140 141  K 4.1 5.1  CL 99 98  CO2 34* 36*  GLUCOSE 144* 167*  BUN 18 19  CREATININE 1.50* 1.45*  CALCIUM 8.6* 8.7*   GFR: Estimated Creatinine Clearance: 86.6 mL/min (A) (by C-G formula based on SCr of 1.45 mg/dL (H)). Liver Function Tests: Recent Labs  Lab 06/10/19 1052  AST 28  ALT 27  ALKPHOS 74  BILITOT 0.8  PROT 7.3  ALBUMIN 4.0   No results for input(s): LIPASE, AMYLASE in the last 168 hours. No results for input(s): AMMONIA in the last 168 hours. Coagulation Profile: No results for input(s): INR, PROTIME in the last 168 hours. Cardiac Enzymes: No results for input(s): CKTOTAL, CKMB, CKMBINDEX, TROPONINI in the last 168 hours. BNP (last 3 results) No results for input(s): PROBNP in the last 8760 hours. HbA1C: Recent Labs    06/11/19 0454  HGBA1C 7.0*   CBG: Recent Labs  Lab 06/11/19 1119 06/11/19 1551 06/11/19 2106  06/12/19 0751 06/12/19 1155  GLUCAP 289* 159* 201* 250* 299*   Lipid Profile: No results for input(s): CHOL, HDL, LDLCALC, TRIG, CHOLHDL, LDLDIRECT in the last 72 hours. Thyroid Function Tests: No results for input(s): TSH, T4TOTAL, FREET4, T3FREE, THYROIDAB in the last 72 hours. Anemia Panel: No results for input(s): VITAMINB12, FOLATE, FERRITIN, TIBC, IRON, RETICCTPCT in the last 72 hours. Sepsis Labs: No results for input(s): PROCALCITON, LATICACIDVEN in the last 168 hours.  Recent Results (from the past 240 hour(s))  Respiratory Panel by RT PCR (Flu A&B, Covid) - Nasopharyngeal Swab     Status: None   Collection Time: 06/10/19  2:55 PM   Specimen: Nasopharyngeal Swab  Result Value Ref Range Status   SARS Coronavirus 2 by RT PCR NEGATIVE NEGATIVE Final    Comment: (NOTE) SARS-CoV-2 target  nucleic acids are NOT DETECTED. The SARS-CoV-2 RNA is generally detectable in upper respiratoy specimens during the acute phase of infection. The lowest concentration of SARS-CoV-2 viral copies this assay can detect is 131 copies/mL. A negative result does not preclude SARS-Cov-2 infection and should not be used as the sole basis for treatment or other patient management decisions. A negative result may occur with  improper specimen collection/handling, submission of specimen other than nasopharyngeal swab, presence of viral mutation(s) within the areas targeted by this assay, and inadequate number of viral copies (<131 copies/mL). A negative result must be combined with clinical observations, patient history, and epidemiological information. The expected result is Negative. Fact Sheet for Patients:  PinkCheek.be Fact Sheet for Healthcare Providers:  GravelBags.it This test is not yet ap proved or cleared by the [place] FDA and  has been authorized for detection and/or diagnosis of SARS-CoV-2 by FDA under an Emergency Use  Authorization (EUA). This EUA will remain  in effect (meaning this test can be used) for the duration of the COVID-19 declaration under Section 564(b)(1) of the Act, 21 U.S.C. section 360bbb-3(b)(1), unless the authorization is terminated or revoked sooner.    Influenza A by PCR NEGATIVE NEGATIVE Final   Influenza B by PCR NEGATIVE NEGATIVE Final    Comment: (NOTE) The Xpert Xpress SARS-CoV-2/FLU/RSV assay is intended as an aid in  the diagnosis of influenza from Nasopharyngeal swab specimens and  should not be used as a sole basis for treatment. Nasal washings and  aspirates are unacceptable for Xpert Xpress SARS-CoV-2/FLU/RSV  testing. Fact Sheet for Patients: PinkCheek.be Fact Sheet for Healthcare Providers: GravelBags.it This test is not yet approved or cleared by the [place] FDA and  has been authorized for detection and/or diagnosis of SARS-CoV-2 by  FDA under an Emergency Use Authorization (EUA). This EUA will remain  in effect (meaning this test can be used) for the duration of the  Covid-19 declaration under Section 564(b)(1) of the Act, 21  U.S.C. section 360bbb-3(b)(1), unless the authorization is  terminated or revoked. Performed at [facility], [address] [place], [place] [address]   MRSA PCR Screening     Status: Abnormal   Collection Time: 06/10/19  8:52 PM   Specimen: Nasal Mucosa; Nasopharyngeal  Result Value Ref Range Status   MRSA by PCR (A) NEGATIVE Final    INVALID, UNABLE TO DETERMINE THE PRESENCE OF TARGET DUE TO SPECIMEN INTEGRITY. RECOLLECTION REQUESTED.    Comment: Carver Fila RN [phone] 06/10/19 HNM  Performed at [facility], [address] [place], [place] [address]   MRSA PCR Screening     Status: None   Collection Time: 06/11/19 12:15 AM  Result Value Ref Range Status   MRSA by PCR NEGATIVE NEGATIVE Final    Comment:        The GeneXpert MRSA Assay  (FDA approved for NASAL specimens only), is one component of a comprehensive MRSA colonization surveillance program. It is not intended to diagnose MRSA infection nor to guide or monitor treatment for MRSA infections. Performed at [facility], [address], [place], [place] [address]          Radiology Studies: No results found.      Scheduled Meds: . apixaban  5 mg Oral BID  . arformoterol  15 mcg Nebulization BID  . aspirin EC  81 mg Oral Daily  . atorvastatin  40 mg Oral Daily  . budesonide (PULMICORT) nebulizer solution  0.25 mg Nebulization  BID  . insulin aspart  0-15 Units Subcutaneous TID WC  . insulin detemir  20 Units Subcutaneous QHS  . ipratropium-albuterol  3 mL Nebulization Q6H  . isosorbide mononitrate  10 mg Oral TID  . methylPREDNISolone (SOLU-MEDROL) injection  40 mg Intravenous Q12H  . metoprolol tartrate  100 mg Oral BID  . sodium chloride flush  3 mL Intravenous Once  . sodium chloride flush  3 mL Intravenous Q12H  . theophylline  300 mg Oral BID   Continuous Infusions: . azithromycin Stopped (06/11/19 1754)     LOS: 2 days    Time spent: 35 minutes    Sidney Ace, MD [facility] Pager [phone]  If 7PM-7AM, please contact night-coverage 06/12/2019, 12:59 PM

## 2019-06-12 NOTE — Care Management Important Message (Signed)
Important Message  Patient Details  Name: [name] MRN: [id number] Date of Birth: 10/24/1970   Medicare Important Message Given:  Yes     Dannette Barbara 06/12/2019, 1:34 PM

## 2019-06-13 LAB — CBC WITH DIFFERENTIAL/PLATELET
Abs Immature Granulocytes: 0.11 10*3/uL — ABNORMAL HIGH (ref 0.00–0.07)
Basophils Absolute: 0 10*3/uL (ref 0.0–0.1)
Basophils Relative: 0 %
Eosinophils Absolute: 0 10*3/uL (ref 0.0–0.5)
Eosinophils Relative: 0 %
HCT: 44.6 % (ref 39.0–52.0)
Hemoglobin: 14.2 g/dL (ref 13.0–17.0)
Immature Granulocytes: 1 %
Lymphocytes Relative: 5 %
Lymphs Abs: 0.9 10*3/uL (ref 0.7–4.0)
MCH: 28.1 pg (ref 26.0–34.0)
MCHC: 31.8 g/dL (ref 30.0–36.0)
MCV: 88.3 fL (ref 80.0–100.0)
Monocytes Absolute: 1.3 10*3/uL — ABNORMAL HIGH (ref 0.1–1.0)
Monocytes Relative: 7 %
Neutro Abs: 15.7 10*3/uL — ABNORMAL HIGH (ref 1.7–7.7)
Neutrophils Relative %: 87 %
Platelets: 211 10*3/uL (ref 150–400)
RBC: 5.05 MIL/uL (ref 4.22–5.81)
RDW: 13.2 % (ref 11.5–15.5)
WBC: 18.1 10*3/uL — ABNORMAL HIGH (ref 4.0–10.5)
nRBC: 0 % (ref 0.0–0.2)

## 2019-06-13 LAB — BASIC METABOLIC PANEL
Anion gap: 7 (ref 5–15)
BUN: 20 mg/dL (ref 6–20)
CO2: 35 mmol/L — ABNORMAL HIGH (ref 22–32)
Calcium: 8.9 mg/dL (ref 8.9–10.3)
Chloride: 98 mmol/L (ref 98–111)
Creatinine, Ser: 1.42 mg/dL — ABNORMAL HIGH (ref 0.61–1.24)
GFR calc Af Amer: >60 mL/min (ref >60)
GFR calc non Af Amer: 58 mL/min — ABNORMAL LOW (ref >60)
Glucose, Bld: 246 mg/dL — ABNORMAL HIGH (ref 70–99)
Potassium: 4.6 mmol/L (ref 3.5–5.1)
Sodium: 140 mmol/L (ref 135–145)

## 2019-06-13 LAB — GLUCOSE, CAPILLARY
Glucose-Capillary: 256 mg/dL — ABNORMAL HIGH (ref 70–99)
Glucose-Capillary: 262 mg/dL — ABNORMAL HIGH (ref 70–99)
Glucose-Capillary: 286 mg/dL — ABNORMAL HIGH (ref 70–99)
Glucose-Capillary: 247 mg/dL — ABNORMAL HIGH (ref 70–99)

## 2019-06-13 MED ORDER — ORAL CARE MOUTH RINSE
15.0000 mL | Freq: Two times a day (BID) | OROMUCOSAL | Status: DC
  Administered 2019-06-13 (×2): 15 mL via OROMUCOSAL

## 2019-06-13 MED ORDER — PREDNISONE 20 MG PO TABS
40.0000 mg | ORAL_TABLET | Freq: Every day | ORAL | Status: DC
  Administered 2019-06-14: 40 mg via ORAL
  Filled 2019-06-13: qty 2

## 2019-06-13 NOTE — Progress Notes (Signed)
Patient requesting a sandwich earlier this morning aroun 4am. Explained importance of eating a smaller snack such as plain cracker with peanut butter than to have a whole sandwich. Patient upset with RN and states to RN that his blood sugar never get above 400 and that he knows how his blood sugar is. Explained the rational of morning medication of the solumedrol that he will be receiving that will possibly increase blood sugar and if he ate a sandwich it may cause his blood sugar to increase more than where it needs to be and can defeat the purpose of him being on a Carb Modified diet. Pt refused the cracker but will reattempt to offer crackers again to provide some kind of low carb to satisfy his hunger.

## 2019-06-13 NOTE — Plan of Care (Signed)

## 2019-06-13 NOTE — Progress Notes (Signed)
Patient ambulated around the nurses' desk. Patient has a slow steady pace. Patient did not complain of any dyspnea. Oxygen saturation range between 90-95%.

## 2019-06-13 NOTE — Progress Notes (Signed)
PROGRESS NOTE    [name]  [id number] DOB: [birth year] DOA: 06/10/2019 PCP: [facility]    Brief Narrative:  HPI: 49 y/o M w/ PMH of COPD on 2L [place], sarcoidosis, current smoker, HTN, HLD, DM2, obesity, CHF, CVA, CAD who presents w/ shortness of breath x morning of admission. The shortness of breath is at rest as well as with exertion. Pt tried using his inhalers at home but had no improvement in his shortness of breath. Pt denies any recent sick contacts. Pt's pulmonologist is Dr. [name] who he saw a couple of weeks ago and no changes were made in his medications. Pt denies any fever, chills, sweating, CP, nausea, vomiting, abd pain, dysuria, urinary frequency, urinary urgency, diarrhea, or constipation. Of note, pt has hx of chronic cough.   5/6: Seen and examined in stepdown unit.  Respiratory status improved.  Weaned off of noninvasive positive pressure ventilation.  Saturating well on 4 to 5 L nasal cannula.  Shortness of breath improving.  work of breathing also improved.  5/7: Seen and examined.  Transferred to PCU.  Respiratory status continues to improve slowly.  Remains on 4 L nasal cannula.  Baseline requirement 2 L.  Weaned off BiPAP.  Still with some cough and difficulty with secretions otherwise work of breathing improved.  5/8: Patient seen and examined.  Respiratory status improving slowly.  Now on 3 L nasal cannula.  Baseline requirement 2 L.  Improved clearance of secretions.   Assessment & Plan:   Active Problems:   COPD exacerbation (HCC)   Acute on chronic respiratory failure with hypoxia (HCC)  COPD exacerbation Improving over interval Plan: Continue Solu-Medrol 40 mg IV every 12 hours.  Last dose today.  Switch to p.o. prednisone 40 mg starting tomorrow Scheduled DuoNebs every 6 hours Scheduled Brovana Scheduled Pulmicort Hold home Dulera Continue theophylline Completed azithromycin Smoking cessation encouraged  Chronic hypoxic &  hypercarbic respiratory failure likely secondary to sarcoidosis & COPD.  Weaned from BiPAP Plan: Continue with treatment above for COPD exacerbation  Hx of sarcoidosis continue on home dose of theophylline  Tobacco abuse  smokes 5 cigs a day x 21 years.  Smoking cessation counseling.  Unable to tolerate nicotine patch or nicotine gum   AKI vs CKD  baseline Cr/ GFR is unknown.  Creatinine improving over interval Can likely restart home Lasix tomorrow  HTN: will continue on home dose of metoprolol, isosorbide mononitrate. Will hold home dose of lisinopril secondary to AKI. Hydralazine prn   DM2: will start on SSI w/ accuchecks and continue on levemir but at a reduced dose  HLD: will continue on statin   Elevated troponins: trending down. Likely secondary to demand ischemia.    DVT prophylaxis: Lovenox Code Status: Full Family Communication: None today, offered to call, patient declined Disposition Plan: Status is: Inpatient  Remains inpatient appropriate because:Inpatient level of care appropriate due to severity of illness   Dispo: The patient is from: Home              Anticipated d/c is to: Home              Anticipated d/c date is: 1 day              Patient currently is not medically stable to d/c.   Patient improving but still with acute on chronic hypoxic respiratory failure.  Continue would like to wean back down to baseline 2 L of oxygen prior to discharge.  Has  been improving.  Tentative plan on discharge home 06/14/2019   Consultants:   ICU  Procedures:   NIPPV  Antimicrobials:   Azithromycin (stopped 5/7)   Subjective: Seen and examined Respiratory status improved Normal work of breathing  Objective: Vitals:   06/13/19 0229 06/13/19 0345 06/13/19 0750 06/13/19 1151  BP:  115/68 131/86 (!) 140/91  Pulse:  69 72 78  Resp:  19 16 18   Temp:  98.4 F (36.9 C) 98.4 F (36.9 C) 98.2 F (36.8 C)  TempSrc:  Oral    SpO2:  97% 98% 94%    Weight: 131.1 kg     Height:        Intake/Output Summary (Last 24 hours) at 06/13/2019 1319 Last data filed at 06/13/2019 0945 Gross per 24 hour  Intake 861.12 ml  Output 500 ml  Net 361.12 ml   Filed Weights   06/11/19 2108 06/12/19 0334 06/13/19 0229  Weight: 127.9 kg 128.5 kg 131.1 kg    Examination:  General exam: Appears calm and comfortable  Respiratory system: Air entry decreased bilaterally.  Normal work of breathing.  Scattered wheeze Cardiovascular system: S1 & S2 heard, RRR. No JVD, murmurs, rubs, gallops or clicks. No pedal edema. Gastrointestinal system: Abdomen is nondistended, soft and nontender. No organomegaly or masses felt. Normal bowel sounds heard. Central nervous system: Alert and oriented. No focal neurological deficits. Extremities: Symmetric 5 x 5 power. Skin: No rashes, lesions or ulcers Psychiatry: Judgement and insight appear normal. Mood & affect appropriate.     Data Reviewed: I have personally reviewed following labs and imaging studies  CBC: Recent Labs  Lab 06/10/19 1052 06/11/19 0454 06/13/19 0508  WBC 6.7 9.5 18.1*  NEUTROABS  --   --  15.7*  HGB 15.1 15.2 14.2  HCT 47.8 47.6 44.6  MCV 88.8 87.8 88.3  PLT 193 [id number]   Basic Metabolic Panel: Recent Labs  Lab 06/10/19 1052 06/11/19 0454 06/13/19 0508  NA 140 141 140  K 4.1 5.1 4.6  CL 99 98 98  CO2 34* 36* 35*  GLUCOSE 144* 167* 246*  BUN 18 19 20   CREATININE 1.50* 1.45* 1.42*  CALCIUM 8.6* 8.7* 8.9   GFR: Estimated Creatinine Clearance: 89.4 mL/min (A) (by C-G formula based on SCr of 1.42 mg/dL (H)). Liver Function Tests: Recent Labs  Lab 06/10/19 1052  AST 28  ALT 27  ALKPHOS 74  BILITOT 0.8  PROT 7.3  ALBUMIN 4.0   No results for input(s): LIPASE, AMYLASE in the last 168 hours. No results for input(s): AMMONIA in the last 168 hours. Coagulation Profile: No results for input(s): INR, PROTIME in the last 168 hours. Cardiac Enzymes: No results for input(s):  CKTOTAL, CKMB, CKMBINDEX, TROPONINI in the last 168 hours. BNP (last 3 results) No results for input(s): PROBNP in the last 8760 hours. HbA1C: Recent Labs    06/11/19 0454  HGBA1C 7.0*   CBG: Recent Labs  Lab 06/12/19 1155 06/12/19 1543 06/12/19 2112 06/13/19 0750 06/13/19 1154  GLUCAP 299* 222* 322* 256* 262*   Lipid Profile: No results for input(s): CHOL, HDL, LDLCALC, TRIG, CHOLHDL, LDLDIRECT in the last 72 hours. Thyroid Function Tests: No results for input(s): TSH, T4TOTAL, FREET4, T3FREE, THYROIDAB in the last 72 hours. Anemia Panel: No results for input(s): VITAMINB12, FOLATE, FERRITIN, TIBC, IRON, RETICCTPCT in the last 72 hours. Sepsis Labs: No results for input(s): PROCALCITON, LATICACIDVEN in the last 168 hours.  Recent Results (from the past 240 hour(s))  Respiratory Panel by  RT PCR (Flu A&B, Covid) - Nasopharyngeal Swab     Status: None   Collection Time: 06/10/19  2:55 PM   Specimen: Nasopharyngeal Swab  Result Value Ref Range Status   SARS Coronavirus 2 by RT PCR NEGATIVE NEGATIVE Final    Comment: (NOTE) SARS-CoV-2 target nucleic acids are NOT DETECTED. The SARS-CoV-2 RNA is generally detectable in upper respiratoy specimens during the acute phase of infection. The lowest concentration of SARS-CoV-2 viral copies this assay can detect is 131 copies/mL. A negative result does not preclude SARS-Cov-2 infection and should not be used as the sole basis for treatment or other patient management decisions. A negative result may occur with  improper specimen collection/handling, submission of specimen other than nasopharyngeal swab, presence of viral mutation(s) within the areas targeted by this assay, and inadequate number of viral copies (<131 copies/mL). A negative result must be combined with clinical observations, patient history, and epidemiological information. The expected result is Negative. Fact Sheet for Patients:    PinkCheek.be Fact Sheet for Healthcare Providers:  GravelBags.it This test is not yet ap proved or cleared by the [place] FDA and  has been authorized for detection and/or diagnosis of SARS-CoV-2 by FDA under an Emergency Use Authorization (EUA). This EUA will remain  in effect (meaning this test can be used) for the duration of the COVID-19 declaration under Section 564(b)(1) of the Act, 21 U.S.C. section 360bbb-3(b)(1), unless the authorization is terminated or revoked sooner.    Influenza A by PCR NEGATIVE NEGATIVE Final   Influenza B by PCR NEGATIVE NEGATIVE Final    Comment: (NOTE) The Xpert Xpress SARS-CoV-2/FLU/RSV assay is intended as an aid in  the diagnosis of influenza from Nasopharyngeal swab specimens and  should not be used as a sole basis for treatment. Nasal washings and  aspirates are unacceptable for Xpert Xpress SARS-CoV-2/FLU/RSV  testing. Fact Sheet for Patients: PinkCheek.be Fact Sheet for Healthcare Providers: GravelBags.it This test is not yet approved or cleared by the [place] FDA and  has been authorized for detection and/or diagnosis of SARS-CoV-2 by  FDA under an Emergency Use Authorization (EUA). This EUA will remain  in effect (meaning this test can be used) for the duration of the  Covid-19 declaration under Section 564(b)(1) of the Act, 21  U.S.C. section 360bbb-3(b)(1), unless the authorization is  terminated or revoked. Performed at [facility], [address] [place], [place] [address]   MRSA PCR Screening     Status: Abnormal   Collection Time: 06/10/19  8:52 PM   Specimen: Nasal Mucosa; Nasopharyngeal  Result Value Ref Range Status   MRSA by PCR (A) NEGATIVE Final    INVALID, UNABLE TO DETERMINE THE PRESENCE OF TARGET DUE TO SPECIMEN INTEGRITY. RECOLLECTION REQUESTED.    Comment: Carver Fila  RN [phone] 06/10/19 HNM  Performed at [facility], [address] [place], [place] [address]   MRSA PCR Screening     Status: None   Collection Time: 06/11/19 12:15 AM  Result Value Ref Range Status   MRSA by PCR NEGATIVE NEGATIVE Final    Comment:        The GeneXpert MRSA Assay (FDA approved for NASAL specimens only), is one component of a comprehensive MRSA colonization surveillance program. It is not intended to diagnose MRSA infection nor to guide or monitor treatment for MRSA infections. Performed at [facility], [address], [place], [place] [address]          Radiology Studies:  No results found.      Scheduled Meds: . apixaban  5 mg Oral BID  . arformoterol  15 mcg Nebulization BID  . aspirin EC  81 mg Oral Daily  . atorvastatin  40 mg Oral Daily  . budesonide (PULMICORT) nebulizer solution  0.25 mg Nebulization BID  . insulin aspart  0-15 Units Subcutaneous TID WC  . insulin detemir  20 Units Subcutaneous QHS  . ipratropium-albuterol  3 mL Nebulization Q6H  . isosorbide mononitrate  10 mg Oral TID  . mouth rinse  15 mL Mouth Rinse BID  . methylPREDNISolone (SOLU-MEDROL) injection  40 mg Intravenous Q12H  . metoprolol tartrate  100 mg Oral BID  . [START ON 06/14/2019] predniSONE  40 mg Oral Q breakfast  . sodium chloride flush  3 mL Intravenous Once  . sodium chloride flush  3 mL Intravenous Q12H  . theophylline  300 mg Oral BID   Continuous Infusions:    LOS: 3 days    Time spent: 35 minutes    Sidney Ace, MD [facility] Pager [phone]  If 7PM-7AM, please contact night-coverage 06/13/2019, 1:19 PM

## 2019-06-14 LAB — CBC WITH DIFFERENTIAL/PLATELET
Abs Immature Granulocytes: 0.13 10*3/uL — ABNORMAL HIGH (ref 0.00–0.07)
Basophils Absolute: 0 10*3/uL (ref 0.0–0.1)
Basophils Relative: 0 %
Eosinophils Absolute: 0 10*3/uL (ref 0.0–0.5)
Eosinophils Relative: 0 %
HCT: 44.6 % (ref 39.0–52.0)
Hemoglobin: 14 g/dL (ref 13.0–17.0)
Immature Granulocytes: 1 %
Lymphocytes Relative: 5 %
Lymphs Abs: 0.7 10*3/uL (ref 0.7–4.0)
MCH: 28.2 pg (ref 26.0–34.0)
MCHC: 31.4 g/dL (ref 30.0–36.0)
MCV: 89.9 fL (ref 80.0–100.0)
Monocytes Absolute: 1.3 10*3/uL — ABNORMAL HIGH (ref 0.1–1.0)
Monocytes Relative: 8 %
Neutro Abs: 13.6 10*3/uL — ABNORMAL HIGH (ref 1.7–7.7)
Neutrophils Relative %: 86 %
Platelets: 201 10*3/uL (ref 150–400)
RBC: 4.96 MIL/uL (ref 4.22–5.81)
RDW: 13.1 % (ref 11.5–15.5)
WBC: 15.8 10*3/uL — ABNORMAL HIGH (ref 4.0–10.5)
nRBC: 0 % (ref 0.0–0.2)

## 2019-06-14 LAB — BASIC METABOLIC PANEL
Anion gap: 7 (ref 5–15)
BUN: 27 mg/dL — ABNORMAL HIGH (ref 6–20)
CO2: 35 mmol/L — ABNORMAL HIGH (ref 22–32)
Calcium: 9 mg/dL (ref 8.9–10.3)
Chloride: 98 mmol/L (ref 98–111)
Creatinine, Ser: 1.38 mg/dL — ABNORMAL HIGH (ref 0.61–1.24)
GFR calc Af Amer: >60 mL/min (ref >60)
GFR calc non Af Amer: 60 mL/min — ABNORMAL LOW (ref >60)
Glucose, Bld: 361 mg/dL — ABNORMAL HIGH (ref 70–99)
Potassium: 4.6 mmol/L (ref 3.5–5.1)
Sodium: 140 mmol/L (ref 135–145)

## 2019-06-14 LAB — GLUCOSE, CAPILLARY
Glucose-Capillary: 299 mg/dL — ABNORMAL HIGH (ref 70–99)
Glucose-Capillary: 274 mg/dL — ABNORMAL HIGH (ref 70–99)

## 2019-06-14 MED ORDER — PREDNISONE 20 MG PO TABS: 40.0000 mg | ORAL_TABLET | Freq: Every day | ORAL | 0 refills | Status: AC

## 2019-06-14 NOTE — Discharge Summary (Signed)
Physician Discharge Summary  [name] [id number] DOB: Jul 22, 1970 DOA: 06/10/2019  PCP: [name] date: 06/10/2019 Discharge date: 06/14/2019  Admitted From: Home Disposition: Home  Recommendations for Outpatient Follow-up:  1. Follow up with PCP in 1-2 weeks 2.   Home Health: No Equipment/Devices: Oxygen, 2 L discharge Condition: Stable CODE STATUS: Full Diet recommendation: Heart Healthy / Carb Modified  Brief/Interim Summary:  HPI:49 y/o M w/ PMH of COPD on 2L [place], sarcoidosis, current smoker, HTN, HLD, DM2, obesity, CHF, CVA, CAD who presents w/ shortness of breath x morning of admission. The shortness of breath is at rest as well as with exertion. Pt tried using his inhalers at home but had no improvement in his shortness of breath. Pt denies any recent sick contacts. Pt's pulmonologist is Dr. [name] who he saw a couple of weeks ago and no changes were made in his medications. Pt denies any fever, chills, sweating, CP, nausea, vomiting, abd pain, dysuria, urinary frequency, urinary urgency, diarrhea, or constipation. Of note, pt has hx of chronic cough.  5/6: Seen and examined in stepdown unit.  Respiratory status improved.  Weaned off of noninvasive positive pressure ventilation.  Saturating well on 4 to 5 L nasal cannula.  Shortness of breath improving.  work of breathing also improved.  5/7: Seen and examined.  Transferred to PCU.  Respiratory status continues to improve slowly.  Remains on 4 L nasal cannula.  Baseline requirement 2 L.  Weaned off BiPAP.  Still with some cough and difficulty with secretions otherwise work of breathing improved.  5/8: Patient seen and examined.  Respiratory status improving slowly.  Now on 3 L nasal cannula.  Baseline requirement 2 L.  Improved clearance of secretions  5/9: Patient seen and examined.  Respiratory status back at baseline.  On 2 L.  Lung sounds improved.  Able to clear secretions.  Stable for  discharge home at this time.  Can resume home regimen.  Educated on smoking cessation.  Discharge Diagnoses:  Active Problems:   COPD exacerbation (HCC)   Acute on chronic respiratory failure with hypoxia (HCC)  COPD exacerbation Improving over interval At baseline at time of discharge Was treated with IV Solu-Medrol in house.  Transition to p.o. prednisone to complete additional 5 days on discharge Can resume home inhaler regimen Nebulizer regimen in house Completed azithromycin in house Smoking cessation encouraged   Chronic hypoxic & hypercarbic respiratory failure likely secondary to sarcoidosis& COPD.  Weaned from BiPAP Respiratory status baseline at time of discharge  Hx of sarcoidosis continue on home dose of theophylline  Tobacco abuse  smokes 5 cigs a day x 21 years.  Smoking cessation counseling.  Unable to tolerate nicotine patch or nicotine gum  AKIvs CKD  baseline Cr/ GFR is unknown. Creatinine improving over interval Can likely restart home Lasix tomorrow  HTN: will continue on home dose of metoprolol, isosorbide mononitrate. Will hold home dose of lisinopril secondary to AKI. Hydralazine prn  DM2: will start on SSI w/ accuchecks and continue on levemir but at a reduced dose  HLD: will continue on statin  Elevated troponins: trending down. Likely secondary to demand ischemia.   Discharge Instructions  Discharge Instructions    Diet - low sodium heart healthy   Complete by: As directed    Increase activity slowly   Complete by: As directed      Allergies as of 06/14/2019      Reactions   Penicillins Anaphylaxis, Other (See Comments)  Has patient had a PCN reaction causing immediate rash, facial/tongue/throat swelling, SOB or lightheadedness with hypotension: Yes Has patient had a PCN reaction causing severe rash involving mucus membranes or skin necrosis: No Has patient had a PCN reaction that required hospitalization: No Has  patient had a PCN reaction occurring within the last 10 years: No If all of the above answers are "NO", then may proceed with Cephalosporin use.      Medication List    TAKE these medications   albuterol 108 (90 Base) MCG/ACT inhaler Commonly known as: VENTOLIN HFA Inhale 1-2 puffs into the lungs every 4 (four) hours as needed for wheezing or shortness of breath.   apixaban 5 MG Tabs tablet Commonly known as: ELIQUIS Take 5 mg by mouth 2 (two) times daily.   aspirin 81 MG EC tablet Take 81 mg by mouth at bedtime.   atorvastatin 40 MG tablet Commonly known as: LIPITOR Take 40 mg by mouth at bedtime.   Fluticasone-Salmeterol 250-50 MCG/DOSE Aepb Commonly known as: ADVAIR Inhale 1 puff into the lungs 2 (two) times daily.   furosemide 80 MG tablet Commonly known as: LASIX Take 1 tablet (80 mg total) by mouth 2 (two) times daily. 60 mg p.o. twice daily for 4 days , resume home dose thereafter. What changed:   how much to take  when to take this  additional instructions   insulin detemir 100 UNIT/ML injection Commonly known as: LEVEMIR Inject 40-50 Units into the skin at bedtime.   insulin lispro 100 UNIT/ML injection Commonly known as: HUMALOG Inject 5-15 Units into the skin 3 (three) times daily with meals. Sliding scale as needed   ipratropium-albuterol 0.5-2.5 (3) MG/3ML Soln Commonly known as: DUONEB Inhale 3 mLs into the lungs 4 (four) times daily as needed for wheezing.   isosorbide dinitrate 10 MG tablet Commonly known as: ISORDIL Take 10 mg by mouth 3 (three) times daily.   lisinopril 30 MG tablet Commonly known as: ZESTRIL Take 30 mg by mouth at bedtime.   melatonin 5 MG Tabs Take 5 mg by mouth at bedtime as needed for sleep.   metoprolol tartrate 100 MG tablet Commonly known as: LOPRESSOR Take 1 tablet (100 mg total) by mouth 2 (two) times daily. What changed: when to take this   potassium chloride SA 20 MEQ tablet Commonly known as:  KLOR-CON Take 20 mEq by mouth at bedtime.   predniSONE 20 MG tablet Commonly known as: DELTASONE Take 2 tablets (40 mg total) by mouth daily with breakfast for 4 days. Start taking on: Jun 15, 2019   theophylline 300 MG 12 hr tablet Commonly known as: THEODUR Take 300 mg by mouth 2 (two) times daily.   tiotropium 18 MCG inhalation capsule Commonly known as: SPIRIVA Place 18 mcg into inhaler and inhale daily.       Allergies  Allergen Reactions  . Penicillins Anaphylaxis and Other (See Comments)    Has patient had a PCN reaction causing immediate rash, facial/tongue/throat swelling, SOB or lightheadedness with hypotension: Yes Has patient had a PCN reaction causing severe rash involving mucus membranes or skin necrosis: No Has patient had a PCN reaction that required hospitalization: No Has patient had a PCN reaction occurring within the last 10 years: No If all of the above answers are "NO", then may proceed with Cephalosporin use.     Consultations:  None   Procedures/Studies: DG Chest 2 View  Result Date: 06/10/2019 CLINICAL DATA:  Chest pain, shortness of breath, history of  sarcoidosis EXAM: CHEST - 2 VIEW COMPARISON:  11/16/2018 FINDINGS: Stable cardiomediastinal contours. Persistent elevation of the left hemidiaphragm. Extensive bilateral pleuroparenchymal scarring compatible with known history of sarcoidosis. No superimposed focal airspace opacity. No pleural effusion or pneumothorax. No acute osseous findings. IMPRESSION: 1. Chronic changes related to known sarcoidosis, similar to the previous exams. 2. No new superimposed acute cardiopulmonary process is evident. Electronically Signed   By: Davina Poke D.O.   On: 06/10/2019 11:17    (Echo, Carotid, EGD, Colonoscopy, ERCP)    Subjective: Seen and examined the day of discharge No complaints, feels well Respiratory status at baseline  Discharge Exam: Vitals:   06/14/19 0751 06/14/19 1200  BP: 132/80 131/80   Pulse: 72 75  Resp: 18 17  Temp: 98.2 F (36.8 C) 98.2 F (36.8 C)  SpO2: 97% 95%   Vitals:   06/14/19 0215 06/14/19 0440 06/14/19 0751 06/14/19 1200  BP:  124/77 132/80 131/80  Pulse:  66 72 75  Resp:   18 17  Temp:  97.7 F (36.5 C) 98.2 F (36.8 C) 98.2 F (36.8 C)  TempSrc:  Oral    SpO2: 96% 99% 97% 95%  Weight:  131.5 kg    Height:        General: Pt is alert, awake, not in acute distress Cardiovascular: RRR, S1/S2 +, no rubs, no gallops Respiratory: CTA bilaterally, no wheezing, no rhonchi Abdominal: Soft, NT, ND, bowel sounds + Extremities: no edema, no cyanosis    The results of significant diagnostics from this hospitalization (including imaging, microbiology, ancillary and laboratory) are listed below for reference.     Microbiology: Recent Results (from the past 240 hour(s))  Respiratory Panel by RT PCR (Flu A&B, Covid) - Nasopharyngeal Swab     Status: None   Collection Time: 06/10/19  2:55 PM   Specimen: Nasopharyngeal Swab  Result Value Ref Range Status   SARS Coronavirus 2 by RT PCR NEGATIVE NEGATIVE Final    Comment: (NOTE) SARS-CoV-2 target nucleic acids are NOT DETECTED. The SARS-CoV-2 RNA is generally detectable in upper respiratoy specimens during the acute phase of infection. The lowest concentration of SARS-CoV-2 viral copies this assay can detect is 131 copies/mL. A negative result does not preclude SARS-Cov-2 infection and should not be used as the sole basis for treatment or other patient management decisions. A negative result may occur with  improper specimen collection/handling, submission of specimen other than nasopharyngeal swab, presence of viral mutation(s) within the areas targeted by this assay, and inadequate number of viral copies (<131 copies/mL). A negative result must be combined with clinical observations, patient history, and epidemiological information. The expected result is Negative. Fact Sheet for Patients:   PinkCheek.be Fact Sheet for Healthcare Providers:  GravelBags.it This test is not yet ap proved or cleared by the [place] FDA and  has been authorized for detection and/or diagnosis of SARS-CoV-2 by FDA under an Emergency Use Authorization (EUA). This EUA will remain  in effect (meaning this test can be used) for the duration of the COVID-19 declaration under Section 564(b)(1) of the Act, 21 U.S.C. section 360bbb-3(b)(1), unless the authorization is terminated or revoked sooner.    Influenza A by PCR NEGATIVE NEGATIVE Final   Influenza B by PCR NEGATIVE NEGATIVE Final    Comment: (NOTE) The Xpert Xpress SARS-CoV-2/FLU/RSV assay is intended as an aid in  the diagnosis of influenza from Nasopharyngeal swab specimens and  should not be used as a sole basis for treatment. Nasal washings and  aspirates are unacceptable for Xpert Xpress SARS-CoV-2/FLU/RSV  testing. Fact Sheet for Patients: PinkCheek.be Fact Sheet for Healthcare Providers: GravelBags.it This test is not yet approved or cleared by the [place] FDA and  has been authorized for detection and/or diagnosis of SARS-CoV-2 by  FDA under an Emergency Use Authorization (EUA). This EUA will remain  in effect (meaning this test can be used) for the duration of the  Covid-19 declaration under Section 564(b)(1) of the Act, 21  U.S.C. section 360bbb-3(b)(1), unless the authorization is  terminated or revoked. Performed at [facility], [address] [place], [place] [address]   MRSA PCR Screening     Status: Abnormal   Collection Time: 06/10/19  8:52 PM   Specimen: Nasal Mucosa; Nasopharyngeal  Result Value Ref Range Status   MRSA by PCR (A) NEGATIVE Final    INVALID, UNABLE TO DETERMINE THE PRESENCE OF TARGET DUE TO SPECIMEN INTEGRITY. RECOLLECTION REQUESTED.    Comment: Carver Fila  RN [phone] 06/10/19 HNM  Performed at [facility], [address] [place], [place] [address]   MRSA PCR Screening     Status: None   Collection Time: 06/11/19 12:15 AM  Result Value Ref Range Status   MRSA by PCR NEGATIVE NEGATIVE Final    Comment:        The GeneXpert MRSA Assay (FDA approved for NASAL specimens only), is one component of a comprehensive MRSA colonization surveillance program. It is not intended to diagnose MRSA infection nor to guide or monitor treatment for MRSA infections. Performed at [facility], [address] [place], [place] [address]      Labs: BNP (last 3 results) Recent Labs    11/16/18 1450 11/17/18 0743 06/10/19 1152  BNP 60.0 103.0* [id number]   Basic Metabolic Panel: Recent Labs  Lab 06/10/19 1052 06/11/19 0454 06/13/19 0508 06/14/19 0548  NA 140 141 140 140  K 4.1 5.1 4.6 4.6  CL 99 98 98 98  CO2 34* 36* 35* 35*  GLUCOSE 144* 167* 246* 361*  BUN 18 19 20  27*  CREATININE 1.50* 1.45* 1.42* 1.38*  CALCIUM 8.6* 8.7* 8.9 9.0   Liver Function Tests: Recent Labs  Lab 06/10/19 1052  AST 28  ALT 27  ALKPHOS 74  BILITOT 0.8  PROT 7.3  ALBUMIN 4.0   No results for input(s): LIPASE, AMYLASE in the last 168 hours. No results for input(s): AMMONIA in the last 168 hours. CBC: Recent Labs  Lab 06/10/19 1052 06/11/19 0454 06/13/19 0508 06/14/19 0548  WBC 6.7 9.5 18.1* 15.8*  NEUTROABS  --   --  15.7* 13.6*  HGB 15.1 15.2 14.2 14.0  HCT 47.8 47.6 44.6 44.6  MCV 88.8 87.8 88.3 89.9  PLT 193 208 211 201   Cardiac Enzymes: No results for input(s): CKTOTAL, CKMB, CKMBINDEX, TROPONINI in the last 168 hours. BNP: Invalid input(s): POCBNP CBG: Recent Labs  Lab 06/13/19 1154 06/13/19 1544 06/13/19 2126 06/14/19 0749 06/14/19 1156  GLUCAP 262* 286* 247* 299* 274*   D-Dimer No results for input(s): DDIMER in the last 72 hours. Hgb A1c No results for input(s): HGBA1C in the last 72 hours. Lipid Profile No  results for input(s): CHOL, HDL, LDLCALC, TRIG, CHOLHDL, LDLDIRECT in the last 72 hours. Thyroid function studies No results for input(s): TSH, T4TOTAL, T3FREE, THYROIDAB in the last 72 hours.  Invalid input(s): FREET3 Anemia work up No results for input(s): VITAMINB12, FOLATE, FERRITIN, TIBC, IRON, RETICCTPCT in the last 72 hours. Urinalysis  Component Value Date/Time   COLORURINE YELLOW (A) 06/10/2019 1336   APPEARANCEUR CLEAR (A) 06/10/2019 1336   APPEARANCEUR Hazy 06/01/2013 1139   LABSPEC 1.016 06/10/2019 1336   LABSPEC 1.030 06/01/2013 1139   PHURINE 5.0 06/10/2019 1336   GLUCOSEU NEGATIVE 06/10/2019 1336   GLUCOSEU Negative 06/01/2013 1139   HGBUR NEGATIVE 06/10/2019 [place] 06/10/2019 1336   BILIRUBINUR Negative 06/01/2013 1139   KETONESUR NEGATIVE 06/10/2019 1336   PROTEINUR 100 (A) 06/10/2019 1336   NITRITE NEGATIVE 06/10/2019 1336   LEUKOCYTESUR NEGATIVE 06/10/2019 1336   LEUKOCYTESUR Negative 06/01/2013 1139   Sepsis Labs Invalid input(s): PROCALCITONIN,  WBC,  LACTICIDVEN Microbiology Recent Results (from the past 240 hour(s))  Respiratory Panel by RT PCR (Flu A&B, Covid) - Nasopharyngeal Swab     Status: None   Collection Time: 06/10/19  2:55 PM   Specimen: Nasopharyngeal Swab  Result Value Ref Range Status   SARS Coronavirus 2 by RT PCR NEGATIVE NEGATIVE Final    Comment: (NOTE) SARS-CoV-2 target nucleic acids are NOT DETECTED. The SARS-CoV-2 RNA is generally detectable in upper respiratoy specimens during the acute phase of infection. The lowest concentration of SARS-CoV-2 viral copies this assay can detect is 131 copies/mL. A negative result does not preclude SARS-Cov-2 infection and should not be used as the sole basis for treatment or other patient management decisions. A negative result may occur with  improper specimen collection/handling, submission of specimen other than nasopharyngeal swab, presence of viral mutation(s) within  the areas targeted by this assay, and inadequate number of viral copies (<131 copies/mL). A negative result must be combined with clinical observations, patient history, and epidemiological information. The expected result is Negative. Fact Sheet for Patients:  PinkCheek.be Fact Sheet for Healthcare Providers:  GravelBags.it This test is not yet ap proved or cleared by the [place] FDA and  has been authorized for detection and/or diagnosis of SARS-CoV-2 by FDA under an Emergency Use Authorization (EUA). This EUA will remain  in effect (meaning this test can be used) for the duration of the COVID-19 declaration under Section 564(b)(1) of the Act, 21 U.S.C. section 360bbb-3(b)(1), unless the authorization is terminated or revoked sooner.    Influenza A by PCR NEGATIVE NEGATIVE Final   Influenza B by PCR NEGATIVE NEGATIVE Final    Comment: (NOTE) The Xpert Xpress SARS-CoV-2/FLU/RSV assay is intended as an aid in  the diagnosis of influenza from Nasopharyngeal swab specimens and  should not be used as a sole basis for treatment. Nasal washings and  aspirates are unacceptable for Xpert Xpress SARS-CoV-2/FLU/RSV  testing. Fact Sheet for Patients: PinkCheek.be Fact Sheet for Healthcare Providers: GravelBags.it This test is not yet approved or cleared by the [place] FDA and  has been authorized for detection and/or diagnosis of SARS-CoV-2 by  FDA under an Emergency Use Authorization (EUA). This EUA will remain  in effect (meaning this test can be used) for the duration of the  Covid-19 declaration under Section 564(b)(1) of the Act, 21  U.S.C. section 360bbb-3(b)(1), unless the authorization is  terminated or revoked. Performed at [facility], [address] [place], [place] [address]   MRSA PCR Screening     Status: Abnormal   Collection Time:  06/10/19  8:52 PM   Specimen: Nasal Mucosa; Nasopharyngeal  Result Value Ref Range Status   MRSA by PCR (A) NEGATIVE Final    INVALID, UNABLE TO DETERMINE THE PRESENCE OF TARGET DUE TO SPECIMEN INTEGRITY. RECOLLECTION REQUESTED.  Comment: Carver Fila RN [phone] 06/10/19 HNM  Performed at [facility], [address] [place], [place] [address]   MRSA PCR Screening     Status: None   Collection Time: 06/11/19 12:15 AM  Result Value Ref Range Status   MRSA by PCR NEGATIVE NEGATIVE Final    Comment:        The GeneXpert MRSA Assay (FDA approved for NASAL specimens only), is one component of a comprehensive MRSA colonization surveillance program. It is not intended to diagnose MRSA infection nor to guide or monitor treatment for MRSA infections. Performed at [facility], [address] [place], [place] [address]      Time coordinating discharge: Over 30 minutes  SIGNED:   Sidney Ace, MD  [facility] 06/14/2019, 1:01 PM Pager   If 7PM-7AM, please contact night-coverage

## 2019-06-14 NOTE — Plan of Care (Signed)
  Problem: Education: Goal: Knowledge of General Education information will improve Description: Including pain rating scale, medication(s)/side effects and non-pharmacologic comfort measures Outcome: Progressing   Problem: Health Behavior/Discharge Planning: Goal: Ability to manage health-related needs will improve Outcome: Progressing   Problem: Clinical Measurements: Goal: Respiratory complications will improve Outcome: Progressing   Problem: Education: Goal: Knowledge of disease or condition will improve Outcome: Progressing   Problem: Activity: Goal: Ability to tolerate increased activity will improve Outcome: Progressing

## 2019-07-31 ENCOUNTER — Encounter: Payer: Self-pay | Admitting: Internal Medicine

## 2019-07-31 ENCOUNTER — Emergency Department: Payer: Medicare HMO

## 2019-07-31 ENCOUNTER — Inpatient Hospital Stay
Admission: AD | Admit: 2019-07-31 | Discharge: 2019-08-02 | DRG: 190 | Disposition: A | Discharge status: Home / Self Care | Payer: Medicare HMO | Attending: Internal Medicine | Admitting: Internal Medicine

## 2019-07-31 ENCOUNTER — Other Ambulatory Visit: Payer: Self-pay

## 2019-07-31 DIAGNOSIS — E785 Hyperlipidemia, unspecified: Secondary | ICD-10-CM | POA: Diagnosis present

## 2019-07-31 DIAGNOSIS — R0602 Shortness of breath: Secondary | ICD-10-CM | POA: Diagnosis present

## 2019-07-31 DIAGNOSIS — I2510 Atherosclerotic heart disease of native coronary artery without angina pectoris: Secondary | ICD-10-CM | POA: Diagnosis present

## 2019-07-31 DIAGNOSIS — Z833 Family history of diabetes mellitus: Secondary | ICD-10-CM

## 2019-07-31 DIAGNOSIS — R079 Chest pain, unspecified: Secondary | ICD-10-CM | POA: Diagnosis present

## 2019-07-31 DIAGNOSIS — I110 Hypertensive heart disease with heart failure: Secondary | ICD-10-CM | POA: Diagnosis present

## 2019-07-31 DIAGNOSIS — E1165 Type 2 diabetes mellitus with hyperglycemia: Secondary | ICD-10-CM | POA: Diagnosis not present

## 2019-07-31 DIAGNOSIS — I452 Bifascicular block: Secondary | ICD-10-CM | POA: Diagnosis present

## 2019-07-31 DIAGNOSIS — Z8673 Personal history of transient ischemic attack (TIA), and cerebral infarction without residual deficits: Secondary | ICD-10-CM | POA: Diagnosis not present

## 2019-07-31 DIAGNOSIS — Z7901 Long term (current) use of anticoagulants: Secondary | ICD-10-CM | POA: Diagnosis not present

## 2019-07-31 DIAGNOSIS — E1169 Type 2 diabetes mellitus with other specified complication: Secondary | ICD-10-CM

## 2019-07-31 DIAGNOSIS — Z6836 Body mass index (BMI) 36.0-36.9, adult: Secondary | ICD-10-CM | POA: Diagnosis not present

## 2019-07-31 DIAGNOSIS — E872 Acidosis: Secondary | ICD-10-CM | POA: Diagnosis present

## 2019-07-31 DIAGNOSIS — J441 Chronic obstructive pulmonary disease with (acute) exacerbation: Secondary | ICD-10-CM | POA: Diagnosis present

## 2019-07-31 DIAGNOSIS — D869 Sarcoidosis, unspecified: Secondary | ICD-10-CM | POA: Diagnosis present

## 2019-07-31 DIAGNOSIS — I5042 Chronic combined systolic (congestive) and diastolic (congestive) heart failure: Secondary | ICD-10-CM | POA: Diagnosis present

## 2019-07-31 DIAGNOSIS — F17210 Nicotine dependence, cigarettes, uncomplicated: Secondary | ICD-10-CM | POA: Diagnosis present

## 2019-07-31 DIAGNOSIS — Z794 Long term (current) use of insulin: Secondary | ICD-10-CM

## 2019-07-31 DIAGNOSIS — Z20822 Contact with and (suspected) exposure to covid-19: Secondary | ICD-10-CM | POA: Diagnosis present

## 2019-07-31 DIAGNOSIS — J9621 Acute and chronic respiratory failure with hypoxia: Secondary | ICD-10-CM | POA: Diagnosis present

## 2019-07-31 DIAGNOSIS — E119 Type 2 diabetes mellitus without complications: Secondary | ICD-10-CM

## 2019-07-31 DIAGNOSIS — I10 Essential (primary) hypertension: Secondary | ICD-10-CM | POA: Diagnosis not present

## 2019-07-31 DIAGNOSIS — I480 Paroxysmal atrial fibrillation: Secondary | ICD-10-CM | POA: Diagnosis present

## 2019-07-31 DIAGNOSIS — J9692 Respiratory failure, unspecified with hypercapnia: Secondary | ICD-10-CM | POA: Diagnosis present

## 2019-07-31 DIAGNOSIS — E7800 Pure hypercholesterolemia, unspecified: Secondary | ICD-10-CM | POA: Diagnosis present

## 2019-07-31 DIAGNOSIS — Z7982 Long term (current) use of aspirin: Secondary | ICD-10-CM

## 2019-07-31 DIAGNOSIS — Z905 Acquired absence of kidney: Secondary | ICD-10-CM | POA: Diagnosis not present

## 2019-07-31 DIAGNOSIS — I252 Old myocardial infarction: Secondary | ICD-10-CM

## 2019-07-31 DIAGNOSIS — Z8249 Family history of ischemic heart disease and other diseases of the circulatory system: Secondary | ICD-10-CM | POA: Diagnosis not present

## 2019-07-31 DIAGNOSIS — T380X5A Adverse effect of glucocorticoids and synthetic analogues, initial encounter: Secondary | ICD-10-CM | POA: Diagnosis not present

## 2019-07-31 DIAGNOSIS — G4733 Obstructive sleep apnea (adult) (pediatric): Secondary | ICD-10-CM | POA: Diagnosis present

## 2019-07-31 DIAGNOSIS — Z7951 Long term (current) use of inhaled steroids: Secondary | ICD-10-CM

## 2019-07-31 DIAGNOSIS — I5022 Chronic systolic (congestive) heart failure: Secondary | ICD-10-CM | POA: Diagnosis present

## 2019-07-31 DIAGNOSIS — Y92239 Unspecified place in hospital as the place of occurrence of the external cause: Secondary | ICD-10-CM | POA: Diagnosis not present

## 2019-07-31 DIAGNOSIS — Z79899 Other long term (current) drug therapy: Secondary | ICD-10-CM

## 2019-07-31 DIAGNOSIS — Z880 Allergy status to penicillin: Secondary | ICD-10-CM | POA: Diagnosis not present

## 2019-07-31 DIAGNOSIS — J9622 Acute and chronic respiratory failure with hypercapnia: Secondary | ICD-10-CM | POA: Diagnosis present

## 2019-07-31 DIAGNOSIS — Z716 Tobacco abuse counseling: Secondary | ICD-10-CM

## 2019-07-31 LAB — CBC
HCT: 46.2 % (ref 39.0–52.0)
Hemoglobin: 15 g/dL (ref 13.0–17.0)
MCH: 28.6 pg (ref 26.0–34.0)
MCHC: 32.5 g/dL (ref 30.0–36.0)
MCV: 88 fL (ref 80.0–100.0)
Platelets: 177 10*3/uL (ref 150–400)
RBC: 5.25 MIL/uL (ref 4.22–5.81)
RDW: 12.8 % (ref 11.5–15.5)
WBC: 5.7 10*3/uL (ref 4.0–10.5)
nRBC: 0 % (ref 0.0–0.2)

## 2019-07-31 LAB — BLOOD GAS, ARTERIAL
Acid-Base Excess: 14.4 mmol/L — ABNORMAL HIGH (ref 0.0–2.0)
Bicarbonate: 45.8 mmol/L — ABNORMAL HIGH (ref 20.0–28.0)
Delivery systems: POSITIVE
Expiratory PAP: 6
FIO2: 0.28
Inspiratory PAP: 14
O2 Saturation: 90 %
Patient temperature: 37
pCO2 arterial: 91 mmHg (ref 32.0–48.0)
pH, Arterial: 7.31 — ABNORMAL LOW (ref 7.350–7.450)
pO2, Arterial: 64 mmHg — ABNORMAL LOW (ref 83.0–108.0)

## 2019-07-31 LAB — BLOOD GAS, VENOUS
Acid-Base Excess: 9.2 mmol/L — ABNORMAL HIGH (ref 0.0–2.0)
Bicarbonate: 42 mmol/L — ABNORMAL HIGH (ref 20.0–28.0)
O2 Saturation: 64.2 %
Patient temperature: 37
pCO2, Ven: 105 mmHg (ref 44.0–60.0)
pH, Ven: 7.21 — ABNORMAL LOW (ref 7.250–7.430)
pO2, Ven: 41 mmHg (ref 32.0–45.0)

## 2019-07-31 LAB — GLUCOSE, CAPILLARY
Glucose-Capillary: 219 mg/dL — ABNORMAL HIGH (ref 70–99)
Glucose-Capillary: 287 mg/dL — ABNORMAL HIGH (ref 70–99)
Glucose-Capillary: 311 mg/dL — ABNORMAL HIGH (ref 70–99)

## 2019-07-31 LAB — HIV ANTIBODY (ROUTINE TESTING W REFLEX): HIV Screen 4th Generation wRfx: NONREACTIVE

## 2019-07-31 LAB — BASIC METABOLIC PANEL
Anion gap: 8 (ref 5–15)
BUN: 11 mg/dL (ref 6–20)
CO2: 36 mmol/L — ABNORMAL HIGH (ref 22–32)
Calcium: 8.7 mg/dL — ABNORMAL LOW (ref 8.9–10.3)
Chloride: 98 mmol/L (ref 98–111)
Creatinine, Ser: 1.34 mg/dL — ABNORMAL HIGH (ref 0.61–1.24)
GFR calc Af Amer: >60 mL/min (ref >60)
GFR calc non Af Amer: >60 mL/min (ref >60)
Glucose, Bld: 177 mg/dL — ABNORMAL HIGH (ref 70–99)
Potassium: 4.1 mmol/L (ref 3.5–5.1)
Sodium: 142 mmol/L (ref 135–145)

## 2019-07-31 LAB — TROPONIN I (HIGH SENSITIVITY)
Troponin I (High Sensitivity): 50 ng/L — ABNORMAL HIGH (ref <18)
Troponin I (High Sensitivity): 50 ng/L — ABNORMAL HIGH (ref <18)
Troponin I (High Sensitivity): 46 ng/L — ABNORMAL HIGH (ref <18)
Troponin I (High Sensitivity): 45 ng/L — ABNORMAL HIGH (ref <18)

## 2019-07-31 LAB — SARS CORONAVIRUS 2 BY RT PCR (HOSPITAL ORDER, PERFORMED IN ~~LOC~~ HOSPITAL LAB): SARS Coronavirus 2: NEGATIVE

## 2019-07-31 LAB — BRAIN NATRIURETIC PEPTIDE: B Natriuretic Peptide: 34 pg/mL (ref 0.0–100.0)

## 2019-07-31 MED ORDER — METOPROLOL TARTRATE 50 MG PO TABS
100.0000 mg | ORAL_TABLET | Freq: Every day | ORAL | Status: DC
  Administered 2019-07-31 – 2019-08-01 (×2): 100 mg via ORAL
  Filled 2019-07-31 (×2): qty 2

## 2019-07-31 MED ORDER — ATORVASTATIN CALCIUM 20 MG PO TABS
40.0000 mg | ORAL_TABLET | Freq: Every day | ORAL | Status: DC
  Administered 2019-07-31 – 2019-08-01 (×2): 40 mg via ORAL
  Filled 2019-07-31 (×2): qty 2

## 2019-07-31 MED ORDER — APIXABAN 5 MG PO TABS
5.0000 mg | ORAL_TABLET | Freq: Two times a day (BID) | ORAL | Status: DC
  Administered 2019-07-31 – 2019-08-02 (×4): 5 mg via ORAL
  Filled 2019-07-31 (×4): qty 1

## 2019-07-31 MED ORDER — METHYLPREDNISOLONE SODIUM SUCC 125 MG IJ SOLR
INTRAMUSCULAR | Status: AC
  Filled 2019-07-31: qty 2

## 2019-07-31 MED ORDER — FUROSEMIDE 40 MG PO TABS
40.0000 mg | ORAL_TABLET | Freq: Two times a day (BID) | ORAL | Status: DC
  Administered 2019-07-31 – 2019-08-02 (×4): 40 mg via ORAL
  Filled 2019-07-31 (×4): qty 1

## 2019-07-31 MED ORDER — ASPIRIN EC 81 MG PO TBEC
81.0000 mg | DELAYED_RELEASE_TABLET | Freq: Every day | ORAL | Status: DC
  Administered 2019-07-31 – 2019-08-01 (×2): 81 mg via ORAL
  Filled 2019-07-31 (×2): qty 1

## 2019-07-31 MED ORDER — SODIUM CHLORIDE 0.9 % IV SOLN: 250.0000 mL | INTRAVENOUS | Status: DC | PRN

## 2019-07-31 MED ORDER — ISOSORBIDE DINITRATE 10 MG PO TABS
10.0000 mg | ORAL_TABLET | Freq: Three times a day (TID) | ORAL | Status: DC
  Administered 2019-07-31 – 2019-08-02 (×5): 10 mg via ORAL
  Filled 2019-07-31 (×9): qty 1

## 2019-07-31 MED ORDER — INSULIN ASPART 100 UNIT/ML ~~LOC~~ SOLN
0.0000 [IU] | Freq: Every day | SUBCUTANEOUS | Status: DC
  Administered 2019-08-01: 3 [IU] via SUBCUTANEOUS
  Administered 2019-08-01: 4 [IU] via SUBCUTANEOUS
  Filled 2019-07-31 (×2): qty 1

## 2019-07-31 MED ORDER — MELATONIN 5 MG PO TABS: 5.0000 mg | ORAL_TABLET | Freq: Every evening | ORAL | Status: DC | PRN

## 2019-07-31 MED ORDER — METHYLPREDNISOLONE SODIUM SUCC 40 MG IJ SOLR
40.0000 mg | Freq: Two times a day (BID) | INTRAMUSCULAR | Status: DC
  Administered 2019-07-31 – 2019-08-02 (×4): 40 mg via INTRAVENOUS
  Filled 2019-07-31 (×4): qty 1

## 2019-07-31 MED ORDER — MOMETASONE FURO-FORMOTEROL FUM 200-5 MCG/ACT IN AERO
2.0000 | INHALATION_SPRAY | Freq: Two times a day (BID) | RESPIRATORY_TRACT | Status: DC
  Administered 2019-07-31 – 2019-08-02 (×4): 2 via RESPIRATORY_TRACT
  Filled 2019-07-31: qty 8.8

## 2019-07-31 MED ORDER — PREDNISONE 20 MG PO TABS
60.0000 mg | ORAL_TABLET | Freq: Once | ORAL | Status: AC
  Administered 2019-07-31: 60 mg via ORAL
  Filled 2019-07-31: qty 3

## 2019-07-31 MED ORDER — IPRATROPIUM-ALBUTEROL 0.5-2.5 (3) MG/3ML IN SOLN
3.0000 mL | Freq: Four times a day (QID) | RESPIRATORY_TRACT | Status: DC | PRN
  Administered 2019-08-01: 3 mL via RESPIRATORY_TRACT
  Filled 2019-07-31: qty 3

## 2019-07-31 MED ORDER — SODIUM CHLORIDE 0.9% FLUSH: 3.0000 mL | INTRAVENOUS | Status: DC | PRN

## 2019-07-31 MED ORDER — SODIUM CHLORIDE 0.9% FLUSH
3.0000 mL | Freq: Two times a day (BID) | INTRAVENOUS | Status: DC
  Administered 2019-07-31 – 2019-08-02 (×4): 3 mL via INTRAVENOUS

## 2019-07-31 MED ORDER — LISINOPRIL 10 MG PO TABS
30.0000 mg | ORAL_TABLET | Freq: Every day | ORAL | Status: DC
  Administered 2019-07-31 – 2019-08-01 (×2): 30 mg via ORAL
  Filled 2019-07-31 (×2): qty 3

## 2019-07-31 MED ORDER — POTASSIUM CHLORIDE CRYS ER 20 MEQ PO TBCR
20.0000 meq | EXTENDED_RELEASE_TABLET | Freq: Every day | ORAL | Status: DC
  Administered 2019-07-31 – 2019-08-01 (×2): 20 meq via ORAL
  Filled 2019-07-31 (×2): qty 1

## 2019-07-31 MED ORDER — IPRATROPIUM-ALBUTEROL 0.5-2.5 (3) MG/3ML IN SOLN
9.0000 mL | Freq: Once | RESPIRATORY_TRACT | Status: AC
  Administered 2019-07-31: 9 mL via RESPIRATORY_TRACT
  Filled 2019-07-31: qty 9

## 2019-07-31 MED ORDER — THEOPHYLLINE ER 300 MG PO TB12
300.0000 mg | ORAL_TABLET | Freq: Two times a day (BID) | ORAL | Status: DC
  Administered 2019-07-31 – 2019-08-02 (×4): 300 mg via ORAL
  Filled 2019-07-31 (×5): qty 1

## 2019-07-31 MED ORDER — INSULIN ASPART 100 UNIT/ML ~~LOC~~ SOLN
0.0000 [IU] | Freq: Three times a day (TID) | SUBCUTANEOUS | Status: DC
  Administered 2019-07-31: 7 [IU] via SUBCUTANEOUS
  Administered 2019-08-01: 4 [IU] via SUBCUTANEOUS
  Administered 2019-08-01 (×2): 7 [IU] via SUBCUTANEOUS
  Administered 2019-08-02: 11 [IU] via SUBCUTANEOUS
  Filled 2019-07-31 (×5): qty 1

## 2019-07-31 MED ORDER — TIOTROPIUM BROMIDE MONOHYDRATE 18 MCG IN CAPS
18.0000 ug | ORAL_CAPSULE | Freq: Every day | RESPIRATORY_TRACT | Status: DC
  Administered 2019-08-01: 18 ug via RESPIRATORY_TRACT
  Filled 2019-07-31: qty 5

## 2019-07-31 NOTE — ED Notes (Signed)
RT at bedside to trial pt on bipap.

## 2019-07-31 NOTE — Plan of Care (Signed)
  Problem: Education: Goal: Ability to describe self-care measures that may prevent or decrease complications (Diabetes Survival Skills Education) will improve Outcome: Progressing Goal: Individualized Educational Video(s) Outcome: Progressing   Problem: Coping: Goal: Ability to adjust to condition or change in health will improve Outcome: Progressing   

## 2019-07-31 NOTE — ED Notes (Signed)
Admitting at bedside 

## 2019-07-31 NOTE — ED Triage Notes (Signed)
Pt reports difficulty breathing over the past couple days, states that he has a history of chf and copd, pt is currently wearing 1.5L of O2, states usually on 2L at home that is humidified, states that he feels like he is having a flare up. Pt has tachypnea noted in triage

## 2019-07-31 NOTE — ED Notes (Signed)
Pt taken to xray, will administer meds when pt returns.

## 2019-07-31 NOTE — ED Provider Notes (Addendum)
[facility] Emergency Department Provider Note   ____________________________________________   First MD Initiated Contact with Patient 07/31/19 1208     (approximate)  I have reviewed the triage vital signs and the nursing notes.   HISTORY  Chief Complaint Shortness of Breath and Chest Pain    HPI [name] is a 49 y.o. male with possible history of CAD, CHF, COPD on 2 L nasal cannula, diabetes, and sarcoidosis who presents to the ED complaining of shortness of breath.  Patient reports that he has had increasing difficulty catching his breath over the past couple of days, is primarily worse with exertion but has been present with rest since yesterday.  He denies any associated fevers or cough, but has had intermittent dull ache in his chest with the symptoms.  The chest pain is not associated with exertion and can come on at any time.  He has also noticed some swelling in both of his legs.  He continues to take Lasix as prescribed, but ran out of his rescue inhaler about 1 week ago.        Past Medical History:  Diagnosis Date   CHF (congestive heart failure) (HCC)    COPD (chronic obstructive pulmonary disease) (HCC)    Coronary artery disease    Diabetes mellitus without complication (HCC)    Hypercholesteremia unk   Hypertension    Myocardial infarction ([place])    Sarcoidosis    Sleep apnea    does not use CPAP regularly.   Stroke [facility])     Patient Active Problem List   Diagnosis Date Noted   Acute on chronic respiratory failure with hypoxia ([place]) 06/10/2019   Acute on chronic respiratory failure with hypoxia and hypercapnia ([place]) 11/17/2018   Acute exacerbation of CHF (congestive heart failure) ([place]) 11/07/2018   Chest pain 11/06/2018   Acute exacerbation of chronic obstructive pulmonary disease (COPD) ([place]) 07/29/2018   COPD with acute exacerbation ([place]) 02/03/2018   COPD (chronic obstructive pulmonary disease) ([place])  07/16/2016   Chronic diastolic heart failure ([place]) 03/23/2016   Hypercarbia    COPD exacerbation ([place]) 11/17/2015   HTN (hypertension) 06/14/2015   Dental caries 03/08/2015   Morbid obesity ([place]) 02/21/2015   OSA and COPD overlap syndrome ([place]) 02/21/2015   Thrombocytopenia ([place]) 02/21/2015   Hypernatremia 02/21/2015   Diabetes ([place]) 10/19/2014   Chronic combined systolic and diastolic congestive heart failure ([place]) 10/18/2014   Chronic obstructive pulmonary disease ([place]) 10/18/2014   Sarcoidosis 04/22/2013   Other and unspecified hyperlipidemia 04/22/2013    Past Surgical History:  Procedure Laterality Date   APPENDECTOMY     PARTIAL NEPHRECTOMY      Prior to Admission medications   Medication Sig Start Date End Date Taking? Authorizing Provider  apixaban (ELIQUIS) 5 MG TABS tablet Take 5 mg by mouth 2 (two) times daily. 06/11/16  Yes [provider]  atorvastatin (LIPITOR) 40 MG tablet Take 40 mg by mouth at bedtime.    Yes [provider]  Fluticasone-Salmeterol (ADVAIR) 250-50 MCG/DOSE AEPB Inhale 1 puff into the lungs 2 (two) times daily.   Yes [provider]  ipratropium-albuterol (DUONEB) 0.5-2.5 (3) MG/3ML SOLN Inhale 3 mLs into the lungs 4 (four) times daily as needed for wheezing. 09/11/18  Yes [provider]  isosorbide dinitrate (ISORDIL) 10 MG tablet Take 10 mg by mouth 3 (three) times daily.   Yes [provider]  lisinopril (ZESTRIL) 30 MG tablet Take 30 mg by mouth at bedtime.  Yes [provider]  potassium chloride SA (KLOR-CON) 20 MEQ tablet Take 20 mEq by mouth at bedtime.    Yes [provider]  theophylline (THEODUR) 300 MG 12 hr tablet Take 300 mg by mouth 2 (two) times daily.    Yes [provider]  tiotropium (SPIRIVA) 18 MCG inhalation capsule Place 18 mcg into inhaler and inhale daily.   Yes [provider]  albuterol (VENTOLIN HFA) 108 (90 Base) MCG/ACT  inhaler Inhale 1-2 puffs into the lungs every 4 (four) hours as needed for wheezing or shortness of breath.    [provider]  aspirin 81 MG EC tablet Take 81 mg by mouth at bedtime.     [provider]  furosemide (LASIX) 80 MG tablet Take 1 tablet (80 mg total) by mouth 2 (two) times daily. 60 mg p.o. twice daily for 4 days , resume home dose thereafter. Patient not taking: Reported on 07/31/2019 11/21/18   Epifanio Lesches, MD  insulin detemir (LEVEMIR) 100 UNIT/ML injection Inject 40-50 Units into the skin at bedtime.     [provider]  insulin lispro (HUMALOG) 100 UNIT/ML injection Inject 5-15 Units into the skin 3 (three) times daily with meals. Sliding scale as needed    [provider]  Melatonin 5 MG TABS Take 5 mg by mouth at bedtime as needed for sleep.    [provider]  metoprolol tartrate (LOPRESSOR) 100 MG tablet Take 1 tablet (100 mg total) by mouth 2 (two) times daily. Patient taking differently: Take 100 mg by mouth at bedtime.  07/30/18   Fritzi Mandes, MD    Allergies Penicillins  Family History  Problem Relation Age of Onset   Hypertension Mother    Heart disease Mother    Diabetes Mother    Cancer Father     Social History Social History   Tobacco Use   Smoking status: Current Every Day Smoker    Packs/day: 0.25    Years: 25.00    Pack years: 6.25    Types: Cigarettes    Last attempt to quit: 02/05/2015    Years since quitting: 4.4   Smokeless tobacco: Never Used  Vaping Use   Vaping Use: Never used  Substance Use Topics   Alcohol use: Yes    Alcohol/week: 0.0 standard drinks    Comment: occassional   Drug use: Yes    Types: Marijuana, PCP    Review of Systems  Constitutional: No fever/chills Eyes: No visual changes. ENT: No sore throat. Cardiovascular: Positive for chest pain. Respiratory: Positive for shortness of breath. Gastrointestinal: No abdominal pain.  No nausea, no vomiting.   No diarrhea.  No constipation. Genitourinary: Negative for dysuria. Musculoskeletal: Negative for back pain.  Positive for leg swelling. Skin: Negative for rash. Neurological: Negative for headaches, focal weakness or numbness.  ____________________________________________   PHYSICAL EXAM:  VITAL SIGNS: ED Triage Vitals  Enc Vitals Group     BP 07/31/19 1204 (!) 144/84     Pulse Rate 07/31/19 1204 96     Resp 07/31/19 1204 (!) 24     Temp 07/31/19 1204 98.5 F (36.9 C)     Temp Source 07/31/19 1204 Oral     SpO2 07/31/19 1204 93 %     Weight 07/31/19 1205 283 lb (128.4 kg)     Height 07/31/19 1205 6\' 1"  (1.854 m)     Head Circumference --      Peak Flow --      Pain  Score 07/31/19 1204 8     Pain Loc --      Pain Edu? --      Excl. in [place]? --     Constitutional: Alert and oriented. Eyes: Conjunctivae are normal. Head: Atraumatic. Nose: No congestion/rhinnorhea. Mouth/Throat: Mucous membranes are moist. Neck: Normal ROM Cardiovascular: Normal rate, regular rhythm. Grossly normal heart sounds. Respiratory: Tachypneic with normal respiratory effort.  No retractions. Lungs with expiratory wheezing and poor air movement bilaterally. Gastrointestinal: Soft and nontender. No distention. Genitourinary: deferred Musculoskeletal: No lower extremity tenderness, 1+ pitting edema to knees bilaterally. Neurologic:  Normal speech and language. No gross focal neurologic deficits are appreciated. Skin:  Skin is warm, dry and intact. No rash noted. Psychiatric: Mood and affect are normal. Speech and behavior are normal.  ____________________________________________   LABS (all labs ordered are listed, but only abnormal results are displayed)  Labs Reviewed  BASIC METABOLIC PANEL - Abnormal; Notable for the following components:      Result Value   CO2 36 (*)    Glucose, Bld 177 (*)    Creatinine, Ser 1.34 (*)    Calcium 8.7 (*)    All other components within normal limits    TROPONIN I (HIGH SENSITIVITY) - Abnormal; Notable for the following components:   Troponin I (High Sensitivity) 50 (*)    All other components within normal limits  TROPONIN I (HIGH SENSITIVITY) - Abnormal; Notable for the following components:   Troponin I (High Sensitivity) 50 (*)    All other components within normal limits  SARS CORONAVIRUS 2 BY RT PCR (HOSPITAL ORDER, [place] [facility])  CBC  BRAIN NATRIURETIC PEPTIDE  BLOOD GAS, VENOUS   ____________________________________________  EKG  ED ECG REPORT I, Blake Divine, the attending physician, personally viewed and interpreted this ECG.   Date: 07/31/2019  EKG Time: 11:56  Rate: 95  Rhythm: normal sinus rhythm  Axis: Normal  Intervals:right bundle branch block and left anterior fascicular block  ST&T Change: None   PROCEDURES  Procedure(s) performed (including Critical Care):  .Critical Care Performed by: Blake Divine, MD Authorized by: Blake Divine, MD   Critical care provider statement:    Critical care time (minutes):  45   Critical care time was exclusive of:  Separately billable procedures and treating other patients and teaching time   Critical care was necessary to treat or prevent imminent or life-threatening deterioration of the following conditions:  Respiratory failure   Critical care was time spent personally by me on the following activities:  Discussions with consultants, evaluation of patient's response to treatment, examination of patient, ordering and performing treatments and interventions, ordering and review of laboratory studies, ordering and review of radiographic studies, pulse oximetry, re-evaluation of patient's condition, obtaining history from patient or surrogate and review of old charts   I assumed direction of critical care for this patient from another provider in my specialty: no       ____________________________________________   INITIAL IMPRESSION /  ASSESSMENT AND PLAN / ED COURSE       49 year old male with history of CAD, CHF, COPD on 2 L nasal cannula presents to the ED with increasing difficulty breathing for the past couple of days along with a dull ache in his chest and swelling in both of his legs.  Audible wheezing with poor air movement on lung exam, patient also noted to have edema to his lower extremities.  His shortness of breath is likely multifactorial with combination of CHF  and COPD, we will treat with breathing treatments and steroids for now, he would also likely benefit from diuresis.  EKG shows known bifascicular block with no acute ischemic changes, low suspicion for ACS but we will screen troponin.  Also check basic labs and chest x-ray.  Chest x-ray negative for acute process, no evidence of CHF or pneumonia.  BNP is also within normal limits and at this point I suspect the majority of his shortness of breath is due to COPD.  His troponin is slightly elevated, however this is similar to his baseline and I doubt ACS.  His tachypnea is somewhat improved, but he continues to complain of some difficulty breathing with a respiratory rate in the mid 20's.  Case was discussed with hospitalist for admission.  Patient noted to have elevated PCO2 and pH of 7.2 consistent with respiratory acidosis.  Hospitalist updated and we will trial patient on BiPAP for now.      ____________________________________________   FINAL CLINICAL IMPRESSION(S) / ED DIAGNOSES  Final diagnoses:  COPD exacerbation ([place])  Shortness of breath     ED Discharge Orders    None       Note:  This document was prepared using Dragon voice recognition software and may include unintentional dictation errors.   Blake Divine, MD 07/31/19 1511    Blake Divine, MD 07/31/19 1550

## 2019-07-31 NOTE — ED Notes (Signed)
Pt provided graham crackers and juice, ok per EDP

## 2019-07-31 NOTE — H&P (Signed)
History and Physical    [name] QMV:[id number] DOB: [birth year] DOA: 07/31/2019  PCP: [facility]   Patient coming from: Home  I have personally briefly reviewed patient's old medical records in [place]  Chief Complaint: Shortness of breath                                Chest pain  HPI: [name] is a 49 y.o. male with a PMH significant for  COPD with chronic respiratory failure on 2L [place], sarcoidosis, current smoker, HTN, HLD, DM2, obesity, chronic systolic CHF, CVA, CAD who presents to the emergency room for evaluation of chest pain which started acutely this morning with exertion mostly over the left anterior chest wall and has progressively worsened throughout the course of the day.  Chest pain was associated with shortness of breath above patient's baseline.  He also has bilateral lower extremity swelling.  Patient ran out of his rescue inhaler over a week ago and had nothing to use use. Patient has a cough which is nonproductive and denies having any fever or chills.  He denies having any sick contacts. Upon arrival to the ER he was noted to be tachypneic with respiratory rate in the 40s.  Arterial blood gas showed uncompensated respiratory acidosis Twelve-lead EKG showed normal sinus rhythm with a right bundle branch block and left anterior fascicular block Chest x-ray showed stable bilateral scarring consistent with history of sarcoidosis.   ED Course: Patient is a 49 year old African-American male with a history of COPD, sarcoidosis on chronic respiratory failure on 3 L of oxygen continuous who presents to the ER for evaluation of shortness of breath associated with wheezing and lower extremity swelling.  Arterial blood gas reveals uncompensated respiratory acidosis.  Chest x-ray with no acute findings and BNP is within normal limits.  Patient will be admitted to the hospital for further evaluation  Review of Systems: As per HPI otherwise 10  point review of systems negative.    Past Medical History:  Diagnosis Date  . CHF (congestive heart failure) ([place])   . COPD (chronic obstructive pulmonary disease) ([place])   . Coronary artery disease   . Diabetes mellitus without complication ([place])   . Hypercholesteremia unk  . Hypertension   . Myocardial infarction ([place])   . Sarcoidosis   . Sleep apnea    does not use CPAP regularly.  . Stroke [facility])     Past Surgical History:  Procedure Laterality Date  . APPENDECTOMY    . PARTIAL NEPHRECTOMY       reports that he has been smoking cigarettes. He has a 6.25 pack-year smoking history. He has never used smokeless tobacco. He reports current alcohol use. He reports current drug use. Drugs: Marijuana and PCP.  Allergies  Allergen Reactions  . Penicillins Anaphylaxis and Other (See Comments)    Has patient had a PCN reaction causing immediate rash, facial/tongue/throat swelling, SOB or lightheadedness with hypotension: Yes Has patient had a PCN reaction causing severe rash involving mucus membranes or skin necrosis: No Has patient had a PCN reaction that required hospitalization: No Has patient had a PCN reaction occurring within the last 10 years: No If all of the above answers are "NO", then may proceed with Cephalosporin use.     Family History  Problem Relation Age of Onset  . Hypertension Mother   . Heart disease Mother   . Diabetes Mother   .  Cancer Father      Prior to Admission medications   Medication Sig Start Date End Date Taking? Authorizing Provider  apixaban (ELIQUIS) 5 MG TABS tablet Take 5 mg by mouth 2 (two) times daily. 06/11/16  Yes [provider]  atorvastatin (LIPITOR) 40 MG tablet Take 40 mg by mouth at bedtime.    Yes [provider]  Fluticasone-Salmeterol (ADVAIR) 250-50 MCG/DOSE AEPB Inhale 1 puff into the lungs 2 (two) times daily.   Yes [provider]  ipratropium-albuterol (DUONEB) 0.5-2.5 (3) MG/3ML SOLN Inhale 3 mLs  into the lungs 4 (four) times daily as needed for wheezing. 09/11/18  Yes [provider]  isosorbide dinitrate (ISORDIL) 10 MG tablet Take 10 mg by mouth 3 (three) times daily.   Yes [provider]  lisinopril (ZESTRIL) 30 MG tablet Take 30 mg by mouth at bedtime.    Yes [provider]  potassium chloride SA (KLOR-CON) 20 MEQ tablet Take 20 mEq by mouth at bedtime.    Yes [provider]  theophylline (THEODUR) 300 MG 12 hr tablet Take 300 mg by mouth 2 (two) times daily.    Yes [provider]  tiotropium (SPIRIVA) 18 MCG inhalation capsule Place 18 mcg into inhaler and inhale daily.   Yes [provider]  albuterol (VENTOLIN HFA) 108 (90 Base) MCG/ACT inhaler Inhale 1-2 puffs into the lungs every 4 (four) hours as needed for wheezing or shortness of breath.    [provider]  aspirin 81 MG EC tablet Take 81 mg by mouth at bedtime.     [provider]  furosemide (LASIX) 80 MG tablet Take 1 tablet (80 mg total) by mouth 2 (two) times daily. 60 mg p.o. twice daily for 4 days , resume home dose thereafter. Patient not taking: Reported on 07/31/2019 11/21/18   Epifanio Lesches, MD  insulin detemir (LEVEMIR) 100 UNIT/ML injection Inject 40-50 Units into the skin at bedtime.     [provider]  insulin lispro (HUMALOG) 100 UNIT/ML injection Inject 5-15 Units into the skin 3 (three) times daily with meals. Sliding scale as needed    [provider]  Melatonin 5 MG TABS Take 5 mg by mouth at bedtime as needed for sleep.    [provider]  metoprolol tartrate (LOPRESSOR) 100 MG tablet Take 1 tablet (100 mg total) by mouth 2 (two) times daily. Patient taking differently: Take 100 mg by mouth at bedtime.  07/30/18   Fritzi Mandes, MD    Physical Exam: Vitals:   07/31/19 1440 07/31/19 1446 07/31/19 1550 07/31/19 1554  BP:  (!) 160/93    Pulse: 89 87 83 82  Resp: (!) 45 (!) 24 20 20   Temp:        TempSrc:      SpO2: 96% 91% 96% 94%  Weight:      Height:         Vitals:   07/31/19 1440 07/31/19 1446 07/31/19 1550 07/31/19 1554  BP:  (!) 160/93    Pulse: 89 87 83 82  Resp: (!) 45 (!) 24 20 20   Temp:      TempSrc:      SpO2: 96% 91% 96% 94%  Weight:      Height:        Constitutional: NAD, alert and oriented x 3.  Morbidly obese Eyes: PERRL, lids and conjunctivae normal ENMT: Mucous membranes are moist.  Neck: normal, supple, no masses, no thyromegaly Respiratory: Scattered wheezing, no crackles.  Normal respiratory effort. No accessory muscle use.  Cardiovascular: Regular rate and rhythm, no murmurs / rubs / gallops.2+ extremity edema. 2+ pedal pulses. No carotid bruits.  Abdomen: no tenderness, no masses palpated. No hepatosplenomegaly. Bowel sounds positive. Central adiposity Musculoskeletal: no clubbing / cyanosis. No joint deformity upper and lower extremities.  Skin: no rashes, lesions, ulcers.  Neurologic: No gross focal neurologic deficit. Psychiatric: Normal mood and affect.   Labs on Admission: I have personally reviewed following labs and imaging studies  CBC: Recent Labs  Lab 07/31/19 1217  WBC 5.7  HGB 15.0  HCT 46.2  MCV 88.0  PLT 629   Basic Metabolic Panel: Recent Labs  Lab 07/31/19 1217  NA 142  K 4.1  CL 98  CO2 36*  GLUCOSE 177*  BUN 11  CREATININE 1.34*  CALCIUM 8.7*   GFR: Estimated Creatinine Clearance: 93.7 mL/min (A) (by C-G formula based on SCr of 1.34 mg/dL (H)). Liver Function Tests: No results for input(s): AST, ALT, ALKPHOS, BILITOT, PROT, ALBUMIN in the last 168 hours. No results for input(s): LIPASE, AMYLASE in the last 168 hours. No results for input(s): AMMONIA in the last 168 hours. Coagulation Profile: No results for input(s): INR, PROTIME in the last 168 hours. Cardiac Enzymes: No results for input(s): CKTOTAL, CKMB, CKMBINDEX, TROPONINI in the last 168 hours. BNP (last 3 results) No results for input(s):  PROBNP in the last 8760 hours. HbA1C: No results for input(s): HGBA1C in the last 72 hours. CBG: No results for input(s): GLUCAP in the last 168 hours. Lipid Profile: No results for input(s): CHOL, HDL, LDLCALC, TRIG, CHOLHDL, LDLDIRECT in the last 72 hours. Thyroid Function Tests: No results for input(s): TSH, T4TOTAL, FREET4, T3FREE, THYROIDAB in the last 72 hours. Anemia Panel: No results for input(s): VITAMINB12, FOLATE, FERRITIN, TIBC, IRON, RETICCTPCT in the last 72 hours. Urine analysis:    Component Value Date/Time   COLORURINE YELLOW (A) 06/10/2019 1336   APPEARANCEUR CLEAR (A) 06/10/2019 1336   APPEARANCEUR Hazy 06/01/2013 1139   LABSPEC 1.016 06/10/2019 1336   LABSPEC 1.030 06/01/2013 1139   PHURINE 5.0 06/10/2019 1336   GLUCOSEU NEGATIVE 06/10/2019 1336   GLUCOSEU Negative 06/01/2013 1139   HGBUR NEGATIVE 06/10/2019 1336   BILIRUBINUR NEGATIVE 06/10/2019 1336   BILIRUBINUR Negative 06/01/2013 [place] 06/10/2019 1336   PROTEINUR 100 (A) 06/10/2019 1336   NITRITE NEGATIVE 06/10/2019 [place] 06/10/2019 1336   LEUKOCYTESUR Negative 06/01/2013 1139    Radiological Exams on Admission: DG Chest 2 View  Result Date: 07/31/2019 CLINICAL DATA:  Shortness of breath. EXAM: CHEST - 2 VIEW COMPARISON:  Jun 10, 2019. FINDINGS: The heart size and mediastinal contours are within normal limits. No pneumothorax is noted. Stable elevation of left hemidiaphragm is noted. Stable scarring is noted throughout both lungs consistent with history of sarcoidosis no definite acute abnormality is noted. The visualized skeletal structures are unremarkable. IMPRESSION: Stable bilateral scarring consistent with history of sarcoidosis. Electronically Signed   By: Marijo Conception M.D.   On: 07/31/2019 13:15    EKG: Independently reviewed.  Normal sinus rhythm Right bundle branch block, left anterior fascicular block  Assessment/Plan Principal Problem:    Respiratory failure with hypercapnia (HCC) Active Problems:   Chronic combined systolic and diastolic congestive heart failure (HCC)   Diabetes (HCC)   Morbid obesity (HCC)   HTN (hypertension)   Acute exacerbation of chronic obstructive pulmonary disease (COPD) (HCC)   Chest pain  Acute on chronic hypercarbic respiratory failure: Likely secondary to sarcoidosis & known COPD.   Patient presented to the ER for evaluation of shortness of breath and was tachypneic with increased work of breathing  Arterial blood gas showed uncompensated respiratory acidosis with a PH of 7.20 and Pco2 of 100 We will place patient on BiPAP and repeat arterial blood gas in 2 hours   COPD with acute exacerbation:  Place patient on scheduled and as needed bronchodilator therapy Continue systemic and inhaled steroids No indication for antibiotics at this time  Hx of sarcoidosis:  Continue systemic steroids   Nicotine dependence:  Smokes 5 cigs a day x 21 years.  Smoking cessation counseling.  Unable to tolerate nicotine patch or nicotine gum     HTN Blood pressure is stable on metoprolol, isosorbide mononitrate and lisinopril    DM2:  Place patient on sliding scale coverage with insulin Maintain consistent carbohydrate diet   Chest pain History of coronary artery disease Elevated troponin which is flat and patient has no EKG changes Continue statins, nitrates aspirin and metoprolol   Chronic combined systolic and diastolic dysfunction CHF Not in acute exacerbation Last known LVEF of 40 to 45% Continue diuretic therapy, metoprolol and lisinopril    History of paroxysmal atrial fibrillation Continue metoprolol for rate control Continue apixaban as secondary prophylaxis for an acute stroke       DVT prophylaxis: Apixaban Code Status: Full code Family Communication: Greater than 50% of time was spent discussing plan of care with patient at the bedside.  He verbalizes  understanding and agrees with the plan Disposition Plan: Back to previous home environment Consults called: None    Keyuna Cuthrell MD [facility]     07/31/2019, 4:22 PM

## 2019-08-01 DIAGNOSIS — I10 Essential (primary) hypertension: Secondary | ICD-10-CM

## 2019-08-01 LAB — BASIC METABOLIC PANEL
Anion gap: 8 (ref 5–15)
BUN: 16 mg/dL (ref 6–20)
CO2: 36 mmol/L — ABNORMAL HIGH (ref 22–32)
Calcium: 8.6 mg/dL — ABNORMAL LOW (ref 8.9–10.3)
Chloride: 97 mmol/L — ABNORMAL LOW (ref 98–111)
Creatinine, Ser: 1.41 mg/dL — ABNORMAL HIGH (ref 0.61–1.24)
GFR calc Af Amer: >60 mL/min (ref >60)
GFR calc non Af Amer: 58 mL/min — ABNORMAL LOW (ref >60)
Glucose, Bld: 205 mg/dL — ABNORMAL HIGH (ref 70–99)
Potassium: 4.8 mmol/L (ref 3.5–5.1)
Sodium: 141 mmol/L (ref 135–145)

## 2019-08-01 LAB — BLOOD GAS, ARTERIAL
Acid-Base Excess: 12.6 mmol/L — ABNORMAL HIGH (ref 0.0–2.0)
Bicarbonate: 42.7 mmol/L — ABNORMAL HIGH (ref 20.0–28.0)
FIO2: 28
O2 Saturation: 91.6 %
Patient temperature: 37
pCO2 arterial: 81 mmHg (ref 32.0–48.0)
pH, Arterial: 7.33 — ABNORMAL LOW (ref 7.350–7.450)
pO2, Arterial: 67 mmHg — ABNORMAL LOW (ref 83.0–108.0)

## 2019-08-01 LAB — CBC
HCT: 44 % (ref 39.0–52.0)
Hemoglobin: 14.3 g/dL (ref 13.0–17.0)
MCH: 28.8 pg (ref 26.0–34.0)
MCHC: 32.5 g/dL (ref 30.0–36.0)
MCV: 88.5 fL (ref 80.0–100.0)
Platelets: 177 10*3/uL (ref 150–400)
RBC: 4.97 MIL/uL (ref 4.22–5.81)
RDW: 12.7 % (ref 11.5–15.5)
WBC: 7.9 10*3/uL (ref 4.0–10.5)
nRBC: 0 % (ref 0.0–0.2)

## 2019-08-01 LAB — GLUCOSE, CAPILLARY
Glucose-Capillary: 160 mg/dL — ABNORMAL HIGH (ref 70–99)
Glucose-Capillary: 214 mg/dL — ABNORMAL HIGH (ref 70–99)
Glucose-Capillary: 248 mg/dL — ABNORMAL HIGH (ref 70–99)

## 2019-08-01 MED ORDER — ORAL CARE MOUTH RINSE
15.0000 mL | Freq: Two times a day (BID) | OROMUCOSAL | Status: DC
  Administered 2019-08-01: 15 mL via OROMUCOSAL

## 2019-08-01 MED ORDER — ENSURE MAX PROTEIN PO LIQD
11.0000 [oz_av] | Freq: Every day | ORAL | Status: DC
  Filled 2019-08-01: qty 330

## 2019-08-01 MED ORDER — CHLORHEXIDINE GLUCONATE 0.12 % MT SOLN
15.0000 mL | Freq: Two times a day (BID) | OROMUCOSAL | Status: DC
  Administered 2019-08-01 (×3): 15 mL via OROMUCOSAL
  Filled 2019-08-01 (×3): qty 15

## 2019-08-01 MED ORDER — INSULIN DETEMIR 100 UNIT/ML ~~LOC~~ SOLN
30.0000 [IU] | Freq: Every day | SUBCUTANEOUS | Status: DC
  Filled 2019-08-01: qty 0.3

## 2019-08-01 MED ORDER — ADULT MULTIVITAMIN W/MINERALS CH
1.0000 | ORAL_TABLET | Freq: Every day | ORAL | Status: DC
  Administered 2019-08-01 – 2019-08-02 (×2): 1 via ORAL
  Filled 2019-08-01 (×2): qty 1

## 2019-08-01 MED ORDER — INSULIN DETEMIR 100 UNIT/ML ~~LOC~~ SOLN
30.0000 [IU] | Freq: Every day | SUBCUTANEOUS | Status: DC
  Administered 2019-08-01: 30 [IU] via SUBCUTANEOUS
  Filled 2019-08-01 (×2): qty 0.3

## 2019-08-01 MED ORDER — IPRATROPIUM-ALBUTEROL 0.5-2.5 (3) MG/3ML IN SOLN
3.0000 mL | Freq: Four times a day (QID) | RESPIRATORY_TRACT | Status: DC
  Administered 2019-08-01 – 2019-08-02 (×5): 3 mL via RESPIRATORY_TRACT
  Filled 2019-08-01 (×5): qty 3

## 2019-08-01 NOTE — Progress Notes (Signed)
PT Cancellation Note  Patient Details Name: [name] MRN: [id number] DOB: 19-Feb-1970   Cancelled Treatment:    Reason Eval/Treat Not Completed: PT screened, no needs identified, will sign off PT order received and chart reviewed. Consulted with OT who reports per RN, pt is at baseline [place]. OT reports pt was found sitting EOB on 1.5 L [place]. Pt arose easily without assist, walks in room, and performs single leg balance independently. Pt doffed sock in standing c single UE support. No DME needs at this time.  No skilled acute PT needs identified. Will sign off at this time. No follow up PT anticipated  Everlean Alstrom. Graylon Good, PT, DPT 08/01/19, 3:09 PM

## 2019-08-01 NOTE — Progress Notes (Signed)
OT Cancellation Note  Patient Details Name: [name] MRN: [id number] DOB: Jul 22, 1970   Cancelled Treatment:    Reason Eval/Treat Not Completed: OT screened, no needs identified, will sign off. OT order received and chart reviewed. Per RN, pt is at baseline [place]. Upon arrival, pt found sitting EOB on 1.5 L [place]. Pt rises easily w/o assist, walks in room, and performs single leg stance Independently. Pt doffs sock in standing c single UE support. Pt has no DME needs identified. No skilled acute OT needs identified. Will sign off at this time. No follow up OT anticipated.   Dessie Coma, M.S. OTR/L  08/01/19, 12:29 PM

## 2019-08-01 NOTE — Progress Notes (Signed)
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   GOAL:   Patient will meet greater than or equal to 90% of their needs  MONITOR:   Labs, I & O's, Supplement acceptance, PO intake, Weight trends  REASON FOR ASSESSMENT:   Consult Assessment of nutrition requirement/status  ASSESSMENT:  RD working remotely.  49 year old male admitted for respiratory failure with hypercapnia after presenting with acute onset of chest pain associated with SOB that progressively worsened throughout the day. Past medical history significant for COPD with chronic respiratory failure on 2L [place], sarcoidosis, current smoker, HTN, DM2, obesity, and chronic sCHF.  Per flowsheets, patient consumed 100% of breakfast tray on 6/25. Spoke with pt via phone, reports good po intake of breakfast, recalls omelette with spinach onions, and ham. Patient recalls eating regular diet at home mentions lots of vegetables, fruits, and salads. Patient agreeable to try Ensure Max, will also order double protein portion with breakfast and dinner meals to aid with meeting needs.   Per chart weights have trended down 12.54 lbs (4.3%) over the past 6 weeks which is insignificant, stable over the past 2 years. Patient reports that his weights fluctuates throughout the year 260-275 lbs in the summer due to increased activity, 280-290 lb in the winter.  Medications reviewed and include: Lasix 40 mg po twice daily, SSI, Levemir 30 units daily, Zestril, Methylprednisolone, Klor-con, [place] Labs: CBGs [phone] Lab Results  Component Value Date   HGBA1C 7.0 (H)  06/11/2019     NUTRITION - FOCUSED PHYSICAL EXAM: Unable to complete at this time, RD working remotely.   Diet Order:   Diet Order            Diet Carb Modified Fluid consistency: Thin; Room service appropriate? Yes  Diet effective now                 EDUCATION NEEDS:   No education needs have been identified at this time  Skin:  Skin Assessment: Reviewed RN Assessment  Last BM:  6/24  Height:   Ht Readings from Last 1 Encounters:  07/31/19 6\' 1"  (1.854 m)    Weight:   Wt Readings from Last 1 Encounters:  08/01/19 125.8 kg     BMI:  Body mass index is 36.6 kg/m.  Estimated Nutritional Needs:   Kcal:  2400-2600  Protein:  120-130  Fluid:  >/= 2.2 L/day   Lajuan Lines, RD, LDN Clinical Nutrition After Hours/Weekend Pager # in [phone]

## 2019-08-01 NOTE — Progress Notes (Signed)
PROGRESS NOTE    [name]  EPP:[id number] DOB: 1970-03-12 DOA: 07/31/2019 PCP: [place]    Brief Narrative:  49 year old gentleman with history of COPD with chronic hypoxic respiratory failure on 2 L oxygen at home, sarcoidosis, ongoing smoker, hypertension hyperlipidemia, obesity and sleep apnea on Trilogy ventilator at home presented to the emergency room with chest discomfort and shortness of breath.  In the emergency room, patient was found with hypoxic and hypercapnic respiratory failure, CO2 91 and pH of 7.3.  Admitted with acute on chronic hypercapnic respiratory failure.   Assessment & Plan:   Principal Problem:   Respiratory failure with hypercapnia (HCC) Active Problems:   Chronic combined systolic and diastolic congestive heart failure (HCC)   Diabetes (HCC)   Morbid obesity (HCC)   HTN (hypertension)   Acute exacerbation of chronic obstructive pulmonary disease (COPD) (HCC)   Chest pain  Acute on chronic hypoxic and hypercapnic respiratory failure: Likely multifactorial, however hypercapnia consistent with inadequate ventilation at home.   Clinically improved with BiPAP treatment overnight.  Blood gas with some improvement. Treat as COPD exacerbation with aggressive bronchodilator therapy, systemic and inhaled steroids as well as aggressive chest physiotherapy. CPAP at night and at naps.  Strongly counseled using trilogy ventilator at home, increased duration at home.  Smoker: Counseled to quit.  He is trying to quit.  He said he will quit his smoking but he will not quit smoking marijuana.  Hypertension: Blood pressure stable.  Home medications resumed.  Chest pain: Acute coronary syndrome ruled out.  Resolved.  Chronic combined heart failure: Euvolemic.  On home Lasix.  Paroxysmal atrial fibrillation: Rate controlled on metoprolol and Eliquis.  Type 2 diabetes with hyperglycemia: Now with use of steroids.  Start on long-acting insulin  and continue sliding scale insulin.   DVT prophylaxis:  apixaban (ELIQUIS) tablet 5 mg   Code Status: Full code Family Communication: None Disposition Plan: Status is: Inpatient  Remains inpatient appropriate because:Inpatient level of care appropriate due to severity of illness   Dispo: The patient is from: Home              Anticipated d/c is to: Home              Anticipated d/c date is: 2 days              Patient currently is not medically stable to d/c.         Consultants:   None  Procedures:   None  Antimicrobials:   None   Subjective: Patient seen and examined.  Patient felt much better after being on BiPAP overnight.  Denies any chest pain.  Still remains on some supplemental oxygen. He tells me that he uses about 6 hours of trilogy at home with some interruptions in between.  He smokes about half pack of cigarettes a day. Denies any fever or chills.  Denies any sick contacts. I ordered repeat blood gas and reevaluated, some improvement of CO2.  Clinically improving oxygenation and ventilation.  Objective: Vitals:   07/31/19 2304 08/01/19 0115 08/01/19 0457 08/01/19 0458  BP:    114/79  Pulse:    80  Resp:    20  Temp: 99.5 F (37.5 C)   97.9 F (36.6 C)  TempSrc: Oral     SpO2:  96%  95%  Weight:   125.8 kg   Height:        Intake/Output Summary (Last 24 hours) at 08/01/2019 1120 Last  data filed at 08/01/2019 1100 Gross per 24 hour  Intake 300 ml  Output 900 ml  Net -600 ml   Filed Weights   07/31/19 1205 08/01/19 0457  Weight: 128.4 kg 125.8 kg    Examination:  Physical Exam Constitutional:      Appearance: He is well-developed. He is obese.  HENT:     Head: Normocephalic.  Cardiovascular:     Rate and Rhythm: Regular rhythm.  Pulmonary:     Effort: Pulmonary effort is normal.     Breath sounds: No decreased breath sounds, wheezing, rhonchi or rales.     Comments: Patient is on 3 L oxygen, however he is talking in full  sentences and looks comfortable. Abdominal:     General: Bowel sounds are normal.     Palpations: Abdomen is soft.  Musculoskeletal:     Left lower leg: No edema.  Neurological:     Mental Status: He is alert.       Data Reviewed: I have personally reviewed following labs and imaging studies  CBC: Recent Labs  Lab 07/31/19 1217 08/01/19 0539  WBC 5.7 7.9  HGB 15.0 14.3  HCT 46.2 44.0  MCV 88.0 88.5  PLT 177 540   Basic Metabolic Panel: Recent Labs  Lab 07/31/19 1217 08/01/19 0539  NA 142 141  K 4.1 4.8  CL 98 97*  CO2 36* 36*  GLUCOSE 177* 205*  BUN 11 16  CREATININE 1.34* 1.41*  CALCIUM 8.7* 8.6*   GFR: Estimated Creatinine Clearance: 88.1 mL/min (A) (by C-G formula based on SCr of 1.41 mg/dL (H)). Liver Function Tests: No results for input(s): AST, ALT, ALKPHOS, BILITOT, PROT, ALBUMIN in the last 168 hours. No results for input(s): LIPASE, AMYLASE in the last 168 hours. No results for input(s): AMMONIA in the last 168 hours. Coagulation Profile: No results for input(s): INR, PROTIME in the last 168 hours. Cardiac Enzymes: No results for input(s): CKTOTAL, CKMB, CKMBINDEX, TROPONINI in the last 168 hours. BNP (last 3 results) No results for input(s): PROBNP in the last 8760 hours. HbA1C: No results for input(s): HGBA1C in the last 72 hours. CBG: Recent Labs  Lab 07/31/19 1818 07/31/19 2051 07/31/19 2327 08/01/19 0755  GLUCAP 219* 287* 311* 160*   Lipid Profile: No results for input(s): CHOL, HDL, LDLCALC, TRIG, CHOLHDL, LDLDIRECT in the last 72 hours. Thyroid Function Tests: No results for input(s): TSH, T4TOTAL, FREET4, T3FREE, THYROIDAB in the last 72 hours. Anemia Panel: No results for input(s): VITAMINB12, FOLATE, FERRITIN, TIBC, IRON, RETICCTPCT in the last 72 hours. Sepsis Labs: No results for input(s): PROCALCITON, LATICACIDVEN in the last 168 hours.  Recent Results (from the past 240 hour(s))  SARS Coronavirus 2 by RT PCR (hospital  order, performed in [facility] hospital lab) Nasopharyngeal Nasopharyngeal Swab     Status: None   Collection Time: 07/31/19  2:47 PM   Specimen: Nasopharyngeal Swab  Result Value Ref Range Status   SARS Coronavirus 2 NEGATIVE NEGATIVE Final    Comment: (NOTE) SARS-CoV-2 target nucleic acids are NOT DETECTED.  The SARS-CoV-2 RNA is generally detectable in upper and lower respiratory specimens during the acute phase of infection. The lowest concentration of SARS-CoV-2 viral copies this assay can detect is 250 copies / mL. A negative result does not preclude SARS-CoV-2 infection and should not be used as the sole basis for treatment or other patient management decisions.  A negative result may occur with improper specimen collection / handling, submission of specimen other than nasopharyngeal  swab, presence of viral mutation(s) within the areas targeted by this assay, and inadequate number of viral copies (<250 copies / mL). A negative result must be combined with clinical observations, patient history, and epidemiological information.  Fact Sheet for Patients:   StrictlyIdeas.no  Fact Sheet for Healthcare Providers: BankingDealers.co.za  This test is not yet approved or  cleared by the [place] FDA and has been authorized for detection and/or diagnosis of SARS-CoV-2 by FDA under an Emergency Use Authorization (EUA).  This EUA will remain in effect (meaning this test can be used) for the duration of the COVID-19 declaration under Section 564(b)(1) of the Act, 21 U.S.C. section 360bbb-3(b)(1), unless the authorization is terminated or revoked sooner.  Performed at [facility], [address], [place], [place] [address]          Radiology Studies: DG Chest 2 View  Result Date: 07/31/2019 CLINICAL DATA:  Shortness of breath. EXAM: CHEST - 2 VIEW COMPARISON:  Jun 10, 2019. FINDINGS: The heart size and mediastinal  contours are within normal limits. No pneumothorax is noted. Stable elevation of left hemidiaphragm is noted. Stable scarring is noted throughout both lungs consistent with history of sarcoidosis no definite acute abnormality is noted. The visualized skeletal structures are unremarkable. IMPRESSION: Stable bilateral scarring consistent with history of sarcoidosis. Electronically Signed   By: Marijo Conception M.D.   On: 07/31/2019 13:15        Scheduled Meds: . apixaban  5 mg Oral BID  . aspirin EC  81 mg Oral QHS  . atorvastatin  40 mg Oral QHS  . chlorhexidine  15 mL Mouth Rinse BID  . furosemide  40 mg Oral BID  . insulin aspart  0-20 Units Subcutaneous TID WC  . insulin aspart  0-5 Units Subcutaneous QHS  . insulin detemir  30 Units Subcutaneous Daily  . ipratropium-albuterol  3 mL Nebulization Q6H  . isosorbide dinitrate  10 mg Oral TID  . lisinopril  30 mg Oral QHS  . mouth rinse  15 mL Mouth Rinse q12n4p  . methylPREDNISolone (SOLU-MEDROL) injection  40 mg Intravenous Q12H  . metoprolol tartrate  100 mg Oral QHS  . mometasone-formoterol  2 puff Inhalation BID  . multivitamin with minerals  1 tablet Oral Daily  . potassium chloride SA  20 mEq Oral QHS  . Ensure Max Protein  11 oz Oral Daily  . sodium chloride flush  3 mL Intravenous Q12H  . theophylline  300 mg Oral BID   Continuous Infusions: . sodium chloride       LOS: 1 day    Time spent: 30 minutes    Barb Merino, MD [facility] Pager [phone]

## 2019-08-02 MED ORDER — AZITHROMYCIN 250 MG PO TABS
ORAL_TABLET | ORAL | 0 refills | Status: AC
Start: 2019-08-02 — End: 2019-08-07

## 2019-08-02 MED ORDER — PREDNISONE 10 MG PO TABS
ORAL_TABLET | ORAL | 0 refills | Status: DC
Start: 2019-08-02 — End: 2019-08-17

## 2019-08-02 NOTE — Discharge Summary (Signed)
Physician Discharge Summary  [name] ZLD:[id number] DOB: [birth year] DOA: 07/31/2019  PCP: [place] date: 07/31/2019 Discharge date: 08/02/2019  Admitted From: Home Disposition: Home  Recommendations for Outpatient Follow-up:  1. Follow up with PCP in 1-2 weeks 2. Follow-up with your lung doctor as scheduled next month.  Discharge Condition: Stable CODE STATUS: Full code Diet recommendation: Low-salt and low-carb diet  Discharge summary: 49 year old gentleman with history of COPD with chronic hypoxic respiratory failure on 2 L oxygen at home, sarcoidosis, ongoing smoker, hypertension hyperlipidemia, diabetes on insulin, obesity hypoventilation on Trelegy at home presented to the emergency room with chest discomfort and shortness of breath.  He was found to have acute hypoxic and hypercapnic respiratory failure with CO2 of 91 and pH 7.3.  Chest x-ray was normal.  Findings consistent with hypercapnic respiratory failure secondary to COPD exacerbation.  Admit to the hospital and treated with IV steroids, inhalational steroids, bronchodilator therapy with good clinical recovery.  He was also treated with CPAP with improvement blood gas exchange as well as improvement of symptoms.  Plan: Patient is fairly stable today.  He states he is back to his baseline.  He uses 2 to 3 L of oxygen at daytime.  He has been using Trelegy ventilator, however not consistently. Prednisone taper, azithromycin for 5 days, continue bronchodilator therapy at home. Strongly recommended smoking cessation and patient agreeable. Recommended to increase the duration of ventilation at home, also use in the daytime if he falls asleep and patient is agreeable. Rest of the chronic medical issues remained stable, he is on multiple medications including insulin that he will continue.  Discharge Diagnoses:  Principal Problem:   Respiratory failure with hypercapnia ([place]) Active Problems:    Chronic combined systolic and diastolic congestive heart failure (HCC)   Diabetes (HCC)   Morbid obesity (HCC)   HTN (hypertension)   Acute exacerbation of chronic obstructive pulmonary disease (COPD) ([place])   Chest pain    Discharge Instructions  Discharge Instructions    Call MD for:  difficulty breathing, headache or visual disturbances   Complete by: As directed    Diet - low sodium heart healthy   Complete by: As directed    Diet Carb Modified   Complete by: As directed    Discharge instructions   Complete by: As directed    Use your ventilator at home more often and regularly. Follow-up with your lung doctor. Stop smoking.   Increase activity slowly   Complete by: As directed      Allergies as of 08/02/2019      Reactions   Penicillins Anaphylaxis, Other (See Comments)   Has patient had a PCN reaction causing immediate rash, facial/tongue/throat swelling, SOB or lightheadedness with hypotension: Yes Has patient had a PCN reaction causing severe rash involving mucus membranes or skin necrosis: No Has patient had a PCN reaction that required hospitalization: No Has patient had a PCN reaction occurring within the last 10 years: No If all of the above answers are "NO", then may proceed with Cephalosporin use.      Medication List    TAKE these medications   albuterol 108 (90 Base) MCG/ACT inhaler Commonly known as: VENTOLIN HFA Inhale 1-2 puffs into the lungs every 4 (four) hours as needed for wheezing or shortness of breath.   apixaban 5 MG Tabs tablet Commonly known as: ELIQUIS Take 5 mg by mouth 2 (two) times daily.   aspirin 81 MG EC tablet Take 81 mg  by mouth at bedtime.   atorvastatin 40 MG tablet Commonly known as: LIPITOR Take 40 mg by mouth at bedtime.   azithromycin 250 MG tablet Commonly known as: Zithromax Z-Pak Take 2 tablets (500 mg) on  Day 1,  followed by 1 tablet (250 mg) once daily on Days 2 through 5.   Fluticasone-Salmeterol 250-50  MCG/DOSE Aepb Commonly known as: ADVAIR Inhale 1 puff into the lungs 2 (two) times daily.   furosemide 80 MG tablet Commonly known as: LASIX Take 1 tablet (80 mg total) by mouth 2 (two) times daily. 60 mg p.o. twice daily for 4 days , resume home dose thereafter.   insulin detemir 100 UNIT/ML injection Commonly known as: LEVEMIR Inject 40-50 Units into the skin at bedtime.   insulin lispro 100 UNIT/ML injection Commonly known as: HUMALOG Inject 5-15 Units into the skin 3 (three) times daily with meals. Sliding scale as needed   ipratropium-albuterol 0.5-2.5 (3) MG/3ML Soln Commonly known as: DUONEB Inhale 3 mLs into the lungs 4 (four) times daily as needed for wheezing.   isosorbide dinitrate 10 MG tablet Commonly known as: ISORDIL Take 10 mg by mouth 3 (three) times daily.   lisinopril 30 MG tablet Commonly known as: ZESTRIL Take 30 mg by mouth at bedtime.   melatonin 5 MG Tabs Take 5 mg by mouth at bedtime as needed for sleep.   metoprolol tartrate 100 MG tablet Commonly known as: LOPRESSOR Take 1 tablet (100 mg total) by mouth 2 (two) times daily. What changed: when to take this   potassium chloride SA 20 MEQ tablet Commonly known as: KLOR-CON Take 20 mEq by mouth at bedtime.   predniSONE 10 MG tablet Commonly known as: DELTASONE 4 tabs daily for 3 days 3 tabs daily for 3 days 2 tabs daily for 3 days 1 tabs daily for 3 days   theophylline 300 MG 12 hr tablet Commonly known as: THEODUR Take 300 mg by mouth 2 (two) times daily.   tiotropium 18 MCG inhalation capsule Commonly known as: SPIRIVA Place 18 mcg into inhaler and inhale daily.       Allergies  Allergen Reactions  . Penicillins Anaphylaxis and Other (See Comments)    Has patient had a PCN reaction causing immediate rash, facial/tongue/throat swelling, SOB or lightheadedness with hypotension: Yes Has patient had a PCN reaction causing severe rash involving mucus membranes or skin necrosis: No Has  patient had a PCN reaction that required hospitalization: No Has patient had a PCN reaction occurring within the last 10 years: No If all of the above answers are "NO", then may proceed with Cephalosporin use.      Procedures/Studies: DG Chest 2 View  Result Date: 07/31/2019 CLINICAL DATA:  Shortness of breath. EXAM: CHEST - 2 VIEW COMPARISON:  Jun 10, 2019. FINDINGS: The heart size and mediastinal contours are within normal limits. No pneumothorax is noted. Stable elevation of left hemidiaphragm is noted. Stable scarring is noted throughout both lungs consistent with history of sarcoidosis no definite acute abnormality is noted. The visualized skeletal structures are unremarkable. IMPRESSION: Stable bilateral scarring consistent with history of sarcoidosis. Electronically Signed   By: Marijo Conception M.D.   On: 07/31/2019 13:15    (Echo, Carotid, EGD, Colonoscopy, ERCP)    Subjective: Patient was seen and examined.  He was wearing extra CPAP after eating breakfast.  Has some cough which is chronic.  Getting up and walking to the bathroom on 2 L oxygen with no distress.  He  is ready to go home.  He is agreeable to attempt to quit his smoking and also agreeable to use his ventilator more often at home.   Discharge Exam: Vitals:   08/02/19 0353 08/02/19 0720  BP: 119/63 116/71  Pulse: 79 75  Resp: 20 18  Temp: 98.9 F (37.2 C) 98.4 F (36.9 C)  SpO2: 96% 97%   Vitals:   08/01/19 2324 08/01/19 2340 08/02/19 0353 08/02/19 0720  BP: 139/88  119/63 116/71  Pulse: 83  79 75  Resp: 20  20 18   Temp: 98.4 F (36.9 C)  98.9 F (37.2 C) 98.4 F (36.9 C)  TempSrc: Oral  Oral   SpO2: 97% 96% 96% 97%  Weight:   129.6 kg   Height:        General: Pt is alert, awake, not in acute distress, comfortable.  Walking to the bathroom on 2 L oxygen. Cardiovascular: RRR, S1/S2 +, no rubs, no gallops Respiratory: CTA bilaterally, no wheezing, occasional fine crackles on expiration. Abdominal:  Soft, NT, ND, bowel sounds +, obese and pendulous. Extremities: no edema, no cyanosis    The results of significant diagnostics from this hospitalization (including imaging, microbiology, ancillary and laboratory) are listed below for reference.     Microbiology: Recent Results (from the past 240 hour(s))  SARS Coronavirus 2 by RT PCR (hospital order, performed in [facility] hospital lab) Nasopharyngeal Nasopharyngeal Swab     Status: None   Collection Time: 07/31/19  2:47 PM   Specimen: Nasopharyngeal Swab  Result Value Ref Range Status   SARS Coronavirus 2 NEGATIVE NEGATIVE Final    Comment: (NOTE) SARS-CoV-2 target nucleic acids are NOT DETECTED.  The SARS-CoV-2 RNA is generally detectable in upper and lower respiratory specimens during the acute phase of infection. The lowest concentration of SARS-CoV-2 viral copies this assay can detect is 250 copies / mL. A negative result does not preclude SARS-CoV-2 infection and should not be used as the sole basis for treatment or other patient management decisions.  A negative result may occur with improper specimen collection / handling, submission of specimen other than nasopharyngeal swab, presence of viral mutation(s) within the areas targeted by this assay, and inadequate number of viral copies (<250 copies / mL). A negative result must be combined with clinical observations, patient history, and epidemiological information.  Fact Sheet for Patients:   StrictlyIdeas.no  Fact Sheet for Healthcare Providers: BankingDealers.co.za  This test is not yet approved or  cleared by the [place] FDA and has been authorized for detection and/or diagnosis of SARS-CoV-2 by FDA under an Emergency Use Authorization (EUA).  This EUA will remain in effect (meaning this test can be used) for the duration of the COVID-19 declaration under Section 564(b)(1) of the Act, 21 U.S.C. section  360bbb-3(b)(1), unless the authorization is terminated or revoked sooner.  Performed at [facility], [address] [place], [place] [address]      Labs: BNP (last 3 results) Recent Labs    11/17/18 0743 06/10/19 1152 07/31/19 1217  BNP 103.0* 75.0 72.5   Basic Metabolic Panel: Recent Labs  Lab 07/31/19 1217 08/01/19 0539  NA 142 141  K 4.1 4.8  CL 98 97*  CO2 36* 36*  GLUCOSE 177* 205*  BUN 11 16  CREATININE 1.34* 1.41*  CALCIUM 8.7* 8.6*   Liver Function Tests: No results for input(s): AST, ALT, ALKPHOS, BILITOT, PROT, ALBUMIN in the last 168 hours. No results for input(s): LIPASE, AMYLASE in the last  168 hours. No results for input(s): AMMONIA in the last 168 hours. CBC: Recent Labs  Lab 07/31/19 1217 08/01/19 0539  WBC 5.7 7.9  HGB 15.0 14.3  HCT 46.2 44.0  MCV 88.0 88.5  PLT 177 177   Cardiac Enzymes: No results for input(s): CKTOTAL, CKMB, CKMBINDEX, TROPONINI in the last 168 hours. BNP: Invalid input(s): POCBNP CBG: Recent Labs  Lab 07/31/19 2051 07/31/19 2327 08/01/19 0755 08/01/19 1220 08/01/19 1703  GLUCAP 287* 311* 160* 214* 248*   D-Dimer No results for input(s): DDIMER in the last 72 hours. Hgb A1c No results for input(s): HGBA1C in the last 72 hours. Lipid Profile No results for input(s): CHOL, HDL, LDLCALC, TRIG, CHOLHDL, LDLDIRECT in the last 72 hours. Thyroid function studies No results for input(s): TSH, T4TOTAL, T3FREE, THYROIDAB in the last 72 hours.  Invalid input(s): FREET3 Anemia work up No results for input(s): VITAMINB12, FOLATE, FERRITIN, TIBC, IRON, RETICCTPCT in the last 72 hours. Urinalysis    Component Value Date/Time   COLORURINE YELLOW (A) 06/10/2019 1336   APPEARANCEUR CLEAR (A) 06/10/2019 1336   APPEARANCEUR Hazy 06/01/2013 1139   LABSPEC 1.016 06/10/2019 1336   LABSPEC 1.030 06/01/2013 1139   PHURINE 5.0 06/10/2019 1336   GLUCOSEU NEGATIVE 06/10/2019 1336   GLUCOSEU Negative 06/01/2013  1139   HGBUR NEGATIVE 06/10/2019 [place] 06/10/2019 1336   BILIRUBINUR Negative 06/01/2013 1139   KETONESUR NEGATIVE 06/10/2019 1336   PROTEINUR 100 (A) 06/10/2019 1336   NITRITE NEGATIVE 06/10/2019 1336   LEUKOCYTESUR NEGATIVE 06/10/2019 1336   LEUKOCYTESUR Negative 06/01/2013 1139   Sepsis Labs Invalid input(s): PROCALCITONIN,  WBC,  LACTICIDVEN Microbiology Recent Results (from the past 240 hour(s))  SARS Coronavirus 2 by RT PCR (hospital order, performed in [place] hospital lab) Nasopharyngeal Nasopharyngeal Swab     Status: None   Collection Time: 07/31/19  2:47 PM   Specimen: Nasopharyngeal Swab  Result Value Ref Range Status   SARS Coronavirus 2 NEGATIVE NEGATIVE Final    Comment: (NOTE) SARS-CoV-2 target nucleic acids are NOT DETECTED.  The SARS-CoV-2 RNA is generally detectable in upper and lower respiratory specimens during the acute phase of infection. The lowest concentration of SARS-CoV-2 viral copies this assay can detect is 250 copies / mL. A negative result does not preclude SARS-CoV-2 infection and should not be used as the sole basis for treatment or other patient management decisions.  A negative result may occur with improper specimen collection / handling, submission of specimen other than nasopharyngeal swab, presence of viral mutation(s) within the areas targeted by this assay, and inadequate number of viral copies (<250 copies / mL). A negative result must be combined with clinical observations, patient history, and epidemiological information.  Fact Sheet for Patients:   StrictlyIdeas.no  Fact Sheet for Healthcare Providers: BankingDealers.co.za  This test is not yet approved or  cleared by the [place] FDA and has been authorized for detection and/or diagnosis of SARS-CoV-2 by FDA under an Emergency Use Authorization (EUA).  This EUA will remain in effect (meaning this test  can be used) for the duration of the COVID-19 declaration under Section 564(b)(1) of the Act, 21 U.S.C. section 360bbb-3(b)(1), unless the authorization is terminated or revoked sooner.  Performed at [facility], [address], [place], [place] [address]      Time coordinating discharge: 40 minutes  SIGNED:   Barb Merino, MD  [facility] 08/02/2019, 11:08 AM

## 2019-08-02 NOTE — Progress Notes (Signed)
Patient educated on discharge instructions and verbalized understanding. Patient has home 02 and will drive himself home.

## 2019-08-03 LAB — GLUCOSE, CAPILLARY
Glucose-Capillary: 285 mg/dL — ABNORMAL HIGH (ref 70–99)
Glucose-Capillary: 255 mg/dL — ABNORMAL HIGH (ref 70–99)

## 2019-08-17 ENCOUNTER — Other Ambulatory Visit: Payer: Self-pay

## 2019-08-17 ENCOUNTER — Ambulatory Visit: Payer: Medicare HMO | Attending: Family | Admitting: Family

## 2019-08-17 ENCOUNTER — Encounter: Payer: Self-pay | Admitting: Family

## 2019-08-17 VITALS — BP 123/72 | HR 83 | Resp 18 | Ht 72.0 in | Wt 277.4 lb

## 2019-08-17 DIAGNOSIS — Z7901 Long term (current) use of anticoagulants: Secondary | ICD-10-CM | POA: Diagnosis not present

## 2019-08-17 DIAGNOSIS — J441 Chronic obstructive pulmonary disease with (acute) exacerbation: Secondary | ICD-10-CM | POA: Insufficient documentation

## 2019-08-17 DIAGNOSIS — Z8673 Personal history of transient ischemic attack (TIA), and cerebral infarction without residual deficits: Secondary | ICD-10-CM | POA: Diagnosis not present

## 2019-08-17 DIAGNOSIS — I5022 Chronic systolic (congestive) heart failure: Secondary | ICD-10-CM | POA: Diagnosis not present

## 2019-08-17 DIAGNOSIS — J410 Simple chronic bronchitis: Secondary | ICD-10-CM

## 2019-08-17 DIAGNOSIS — F17210 Nicotine dependence, cigarettes, uncomplicated: Secondary | ICD-10-CM | POA: Insufficient documentation

## 2019-08-17 DIAGNOSIS — D869 Sarcoidosis, unspecified: Secondary | ICD-10-CM | POA: Insufficient documentation

## 2019-08-17 DIAGNOSIS — Z8249 Family history of ischemic heart disease and other diseases of the circulatory system: Secondary | ICD-10-CM | POA: Insufficient documentation

## 2019-08-17 DIAGNOSIS — I5042 Chronic combined systolic (congestive) and diastolic (congestive) heart failure: Secondary | ICD-10-CM

## 2019-08-17 DIAGNOSIS — R0602 Shortness of breath: Secondary | ICD-10-CM | POA: Diagnosis present

## 2019-08-17 DIAGNOSIS — I110 Hypertensive heart disease with heart failure: Secondary | ICD-10-CM | POA: Diagnosis not present

## 2019-08-17 DIAGNOSIS — I252 Old myocardial infarction: Secondary | ICD-10-CM | POA: Insufficient documentation

## 2019-08-17 DIAGNOSIS — Z7982 Long term (current) use of aspirin: Secondary | ICD-10-CM | POA: Insufficient documentation

## 2019-08-17 DIAGNOSIS — E7800 Pure hypercholesterolemia, unspecified: Secondary | ICD-10-CM | POA: Insufficient documentation

## 2019-08-17 DIAGNOSIS — Z794 Long term (current) use of insulin: Secondary | ICD-10-CM | POA: Diagnosis not present

## 2019-08-17 DIAGNOSIS — I2510 Atherosclerotic heart disease of native coronary artery without angina pectoris: Secondary | ICD-10-CM | POA: Diagnosis not present

## 2019-08-17 DIAGNOSIS — E119 Type 2 diabetes mellitus without complications: Secondary | ICD-10-CM | POA: Diagnosis not present

## 2019-08-17 DIAGNOSIS — I10 Essential (primary) hypertension: Secondary | ICD-10-CM

## 2019-08-17 DIAGNOSIS — Z79899 Other long term (current) drug therapy: Secondary | ICD-10-CM | POA: Insufficient documentation

## 2019-08-17 DIAGNOSIS — E785 Hyperlipidemia, unspecified: Secondary | ICD-10-CM | POA: Diagnosis not present

## 2019-08-17 NOTE — Progress Notes (Signed)
Patient ID: [name], male    DOB: [birth year], 49 y.o.   MRN: [id number]  HPI  [name] is a 49 y/o male with a history of CVA, obstructive sleep apnea, HTN, sarcoidosis, hyperlipidemia, DM, CAD, COPD, remote tobacco use and chronic heart failure.  Echo report from 11/17/2018 reviewed and showed an EF of 45-50%. Echo done 02/15/15 and showed an EF of 60% with increased wall thickness. Mild Corey along with mild TR. This is stable from a previous echo done on 08/15/14.  Admitted 07/31/19 due to shortness of breath and chest discomfort secondary to COPD exacerbation. Treated with steroids, nebulizers and CPAP. Discharged after 2 days with steroid taper and antibiotics.   He presents today for a follow-up visit with a chief complaint of minimal shortness of breath upon moderate exertion. He describes this as chronic in nature having been present for several years. He does feel like his breathing is worse during the hot/humid weather but says that it has improved since recent hospital discharge. He has associated pedal edema along with productive cough. He denies any difficulty sleeping, dizziness, abdominal distention, palpitations, chest pain, wheezing, fatigue or weight gain.   Has worn compression socks in the past but hasn't been consistently wearing them.   Past Medical History:  Diagnosis Date  . CHF (congestive heart failure) ([place])   . COPD (chronic obstructive pulmonary disease) ([place])   . Coronary artery disease   . Diabetes mellitus without complication ([place])   . Hypercholesteremia unk  . Hypertension   . Myocardial infarction ([place])   . Sarcoidosis   . Sleep apnea    does not use CPAP regularly.  . Stroke [facility])    Past Surgical History:  Procedure Laterality Date  . APPENDECTOMY    . PARTIAL NEPHRECTOMY     Family History  Problem Relation Age of Onset  . Hypertension Mother   . Heart disease Mother   . Diabetes Mother   . Cancer Father    Social History   Tobacco  Use  . Smoking status: Current Every Day Smoker    Packs/day: 0.25    Years: 25.00    Pack years: 6.25    Types: Cigarettes    Last attempt to quit: 02/05/2015    Years since quitting: 4.5  . Smokeless tobacco: Never Used  Substance Use Topics  . Alcohol use: Yes    Alcohol/week: 0.0 standard drinks    Comment: occassional   Allergies  Allergen Reactions  . Penicillins Anaphylaxis and Other (See Comments)    Has patient had a PCN reaction causing immediate rash, facial/tongue/throat swelling, SOB or lightheadedness with hypotension: Yes Has patient had a PCN reaction causing severe rash involving mucus membranes or skin necrosis: No Has patient had a PCN reaction that required hospitalization: No Has patient had a PCN reaction occurring within the last 10 years: No If all of the above answers are "NO", then may proceed with Cephalosporin use.    Prior to Admission medications   Medication Sig Start Date End Date Taking? Authorizing Provider  albuterol (VENTOLIN HFA) 108 (90 Base) MCG/ACT inhaler Inhale 1-2 puffs into the lungs every 4 (four) hours as needed for wheezing or shortness of breath.   Yes [provider]  apixaban (ELIQUIS) 5 MG TABS tablet Take 5 mg by mouth 2 (two) times daily. 06/11/16  Yes [provider]  aspirin 81 MG EC tablet Take 81 mg by mouth at bedtime.    Yes [provider]  atorvastatin (LIPITOR) 40 MG tablet Take 40 mg by mouth at bedtime.    Yes [provider]  Fluticasone-Salmeterol (ADVAIR) 250-50 MCG/DOSE AEPB Inhale 1 puff into the lungs 2 (two) times daily.   Yes [provider]  furosemide (LASIX) 80 MG tablet Take 1 tablet (80 mg total) by mouth 2 (two) times daily. 60 mg p.o. twice daily for 4 days , resume home dose thereafter. 11/21/18  Yes Epifanio Lesches, MD  insulin aspart (NOVOLOG) 100 UNIT/ML injection Inject into the skin 3 (three) times daily before meals. 5-15 units sliding scale before  meals   Yes [provider]  insulin detemir (LEVEMIR) 100 UNIT/ML injection Inject 40-50 Units into the skin at bedtime.    Yes [provider]  ipratropium-albuterol (DUONEB) 0.5-2.5 (3) MG/3ML SOLN Inhale 3 mLs into the lungs 4 (four) times daily as needed for wheezing. 09/11/18  Yes [provider]  isosorbide dinitrate (ISORDIL) 10 MG tablet Take 10 mg by mouth 3 (three) times daily.   Yes [provider]  lisinopril (ZESTRIL) 30 MG tablet Take 30 mg by mouth at bedtime.    Yes [provider]  Melatonin 5 MG TABS Take 5 mg by mouth at bedtime as needed for sleep.   Yes [provider]  metoprolol tartrate (LOPRESSOR) 100 MG tablet Take 1 tablet (100 mg total) by mouth 2 (two) times daily. Patient taking differently: Take 100 mg by mouth at bedtime.  07/30/18  Yes Fritzi Mandes, MD  potassium chloride SA (KLOR-CON) 20 MEQ tablet Take 20 mEq by mouth at bedtime.    Yes [provider]  theophylline (THEODUR) 300 MG 12 hr tablet Take 300 mg by mouth 2 (two) times daily.    Yes [provider]  tiotropium (SPIRIVA) 18 MCG inhalation capsule Place 18 mcg into inhaler and inhale daily.   Yes [provider]    Review of Systems  Constitutional: Negative for appetite change and fatigue.  HENT: Negative for congestion, postnasal drip and sore throat.   Eyes: Negative.   Respiratory: Positive for cough (productive) and shortness of breath. Negative for chest tightness and wheezing.   Cardiovascular: Positive for leg swelling (when standing for long periods of time). Negative for chest pain and palpitations.  Gastrointestinal: Negative for abdominal distention and abdominal pain.  Endocrine: Negative.   Genitourinary: Negative.   Musculoskeletal: Negative for back pain and neck pain.  Skin: Negative.   Allergic/Immunologic: Negative.   Neurological: Negative for dizziness and light-headedness.  Hematological: Negative  for adenopathy. Does not bruise/bleed easily.  Psychiatric/Behavioral: Negative for dysphoric mood and sleep disturbance (wearing CPAP treligy). The patient is not nervous/anxious.    Vitals:   08/17/19 1428  BP: 123/72  Pulse: 83  Resp: 18  SpO2: 92%  Weight: 277 lb 6 oz (125.8 kg)  Height: 6' (1.829 m)   Wt Readings from Last 3 Encounters:  08/17/19 277 lb 6 oz (125.8 kg)  08/02/19 285 lb 11.2 oz (129.6 kg)  06/14/19 289 lb 14.4 oz (131.5 kg)   Lab Results  Component Value Date   CREATININE 1.41 (H) 08/01/2019   CREATININE 1.34 (H) 07/31/2019   CREATININE 1.38 (H) 06/14/2019   Physical Exam Vitals and nursing note reviewed.  Constitutional:      Appearance: He is well-developed.  HENT:     Head: Normocephalic and atraumatic.  Neck:     Vascular: No JVD.  Cardiovascular:     Rate and Rhythm: Normal rate  and regular rhythm.  Pulmonary:     Effort: Pulmonary effort is normal.     Breath sounds: No wheezing or rales.  Abdominal:     General: There is no distension.     Palpations: Abdomen is soft.     Tenderness: There is no abdominal tenderness.  Musculoskeletal:        General: No tenderness.     Cervical back: Normal range of motion and neck supple.     Right lower leg: No tenderness. Edema (2+ pitting) present.     Left lower leg: No tenderness. Edema (2+ pitting) present.  Skin:    General: Skin is warm and dry.  Neurological:     Mental Status: He is alert and oriented to person, place, and time.  Psychiatric:        Behavior: Behavior normal.        Thought Content: Thought content normal.     Assessment & Plan:  1: Chronic heart failure with mildly reduced ejection fraction- - NYHA class II - euvolemic today - weighing daily; Reminded to call for an overnight weight gain of >2 pounds or a weekly weight gain of >5 pounds - weight down 13 pounds from last visit here 6 months ago - consider changing his lisinopril to entresto at his next visit -  drinking about 4-6 bottles of water daily.  - cooking with salt but not adding to food. Encouraged to not add salt at atll - saw cardiologist ([place]) 01/26/2019 & returns later today - instructed to get compression socks and put them on every morning with removal at bedtime - elevate legs when sitting for long periods of time - BNP 07/27/19 was 34.0  2: HTN- - BP looks good today - sees PCP @ [employer] [employer] ([facility])  - BMP 08/01/19 reviewed and showed sodium 141, potassium 4.8, creatinine 1.41 and GFR >60  3: COPD-  - using trilogy at night - wearing oxygen at 2L when at home or when he's going to be out and about for awhile - saw pulmonologist [name]) 05/22/19 & returns next month - last PFT's done 02/27/2018  4: Diabetes- - last A1c was 7.0% on 06/11/19 - glucose at home today was 105 - had telemedicine visit with nephrology Smith Mince) 07/16/19   Patient did not bring his medications nor a list. Each medication was verbally reviewed with the patient and he was encouraged to bring the bottles to every visit to confirm accuracy of list.  Return in 4 months or sooner for any questions/problems before then.

## 2019-08-17 NOTE — Patient Instructions (Signed)
Continue weighing daily and call for an overnight weight gain of > 2 pounds or a weekly weight gain of >5 pounds. 

## 2019-12-09 ENCOUNTER — Ambulatory Visit: Payer: Medicare HMO | Admitting: Family

## 2019-12-15 ENCOUNTER — Ambulatory Visit: Payer: Medicare HMO | Admitting: Family

## 2019-12-28 NOTE — Progress Notes (Signed)
Patient ID: [name], male    DOB: 1970-11-20, 49 y.o.   MRN: [id number]  HPI  [name] is a 49 y/o male with a history of CVA, obstructive sleep apnea, HTN, sarcoidosis, hyperlipidemia, DM, CAD, COPD, remote tobacco use and chronic heart failure.  Echo report from 11/17/2018 reviewed and showed an EF of 45-50%. Echo done 02/15/15 and showed an EF of 60% with increased wall thickness. Mild Corey along with mild TR. This is stable from a previous echo done on 08/15/14.  Admitted 07/31/19 due to shortness of breath and chest discomfort secondary to COPD exacerbation. Treated with steroids, nebulizers and CPAP. Discharged after 2 days with steroid taper and antibiotics.   He presents today for a follow-up visit with a chief complaint of moderate shortness of breath upon moderate exertion. He describes this as chronic in nature having been present for several years. He does feel like his breathing has worsened due to the fall weather changes. He has associated fatigue, cough and chronic pedal edema along with this. He denies any difficulty sleeping, dizziness, abdominal distention, palpitations, chest pain, wheezing or weight gain.   Now using NIV at bedtime along with oxygen. Requesting a refills on his isordil.   Past Medical History:  Diagnosis Date  . CHF (congestive heart failure) ([place])   . COPD (chronic obstructive pulmonary disease) ([place])   . Coronary artery disease   . Diabetes mellitus without complication ([place])   . Hypercholesteremia unk  . Hypertension   . Myocardial infarction ([place])   . Sarcoidosis   . Sleep apnea    does not use CPAP regularly.  . Stroke [facility])    Past Surgical History:  Procedure Laterality Date  . APPENDECTOMY    . PARTIAL NEPHRECTOMY     Family History  Problem Relation Age of Onset  . Hypertension Mother   . Heart disease Mother   . Diabetes Mother   . Cancer Father    Social History   Tobacco Use  . Smoking status: Current Every Day Smoker     Packs/day: 0.25    Years: 25.00    Pack years: 6.25    Types: Cigarettes    Last attempt to quit: 02/05/2015    Years since quitting: 4.8  . Smokeless tobacco: Never Used  Substance Use Topics  . Alcohol use: Yes    Alcohol/week: 0.0 standard drinks    Comment: occassional   Allergies  Allergen Reactions  . Penicillins Anaphylaxis and Other (See Comments)    Has patient had a PCN reaction causing immediate rash, facial/tongue/throat swelling, SOB or lightheadedness with hypotension: Yes Has patient had a PCN reaction causing severe rash involving mucus membranes or skin necrosis: No Has patient had a PCN reaction that required hospitalization: No Has patient had a PCN reaction occurring within the last 10 years: No If all of the above answers are "NO", then may proceed with Cephalosporin use.    Prior to Admission medications   Medication Sig Start Date End Date Taking? Authorizing Provider  albuterol (VENTOLIN HFA) 108 (90 Base) MCG/ACT inhaler Inhale 1-2 puffs into the lungs every 4 (four) hours as needed for wheezing or shortness of breath.   Yes [provider]  apixaban (ELIQUIS) 5 MG TABS tablet Take 5 mg by mouth 2 (two) times daily. 06/11/16  Yes [provider]  aspirin 81 MG EC tablet Take 81 mg by mouth at bedtime.    Yes [provider]  atorvastatin (LIPITOR) 40  MG tablet Take 40 mg by mouth at bedtime.    Yes [provider]  Fluticasone-Salmeterol (ADVAIR) 250-50 MCG/DOSE AEPB Inhale 1 puff into the lungs 2 (two) times daily.   Yes [provider]  furosemide (LASIX) 80 MG tablet Take 1 tablet (80 mg total) by mouth 2 (two) times daily. 60 mg p.o. twice daily for 4 days , resume home dose thereafter. 11/21/18  Yes Epifanio Lesches, MD  insulin aspart (NOVOLOG) 100 UNIT/ML injection Inject into the skin 3 (three) times daily before meals. 5-15 units sliding scale before meals   Yes [provider]  insulin  detemir (LEVEMIR) 100 UNIT/ML injection Inject 40-50 Units into the skin at bedtime.    Yes [provider]  ipratropium-albuterol (DUONEB) 0.5-2.5 (3) MG/3ML SOLN Inhale 3 mLs into the lungs 4 (four) times daily as needed for wheezing. 09/11/18  Yes [provider]  isosorbide dinitrate (ISORDIL) 10 MG tablet Take 10 mg by mouth 3 (three) times daily.   Yes [provider]  lisinopril (ZESTRIL) 30 MG tablet Take 30 mg by mouth at bedtime.    Yes [provider]  Melatonin 5 MG TABS Take 5 mg by mouth at bedtime as needed for sleep.   Yes [provider]  metoprolol tartrate (LOPRESSOR) 100 MG tablet Take 1 tablet (100 mg total) by mouth 2 (two) times daily. Patient taking differently: Take 100 mg by mouth at bedtime.  07/30/18  Yes Fritzi Mandes, MD  potassium chloride SA (KLOR-CON) 20 MEQ tablet Take 20 mEq by mouth at bedtime.    Yes [provider]  theophylline (THEODUR) 300 MG 12 hr tablet Take 300 mg by mouth 2 (two) times daily.    Yes [provider]  tiotropium (SPIRIVA) 18 MCG inhalation capsule Place 18 mcg into inhaler and inhale daily.   Yes [provider]    Review of Systems  Constitutional: Positive for fatigue. Negative for appetite change.  HENT: Negative for congestion, postnasal drip and sore throat.   Eyes: Negative.   Respiratory: Positive for cough (productive at times) and shortness of breath (worsening with weather changes). Negative for chest tightness and wheezing.   Cardiovascular: Positive for leg swelling (when standing for long periods of time). Negative for chest pain and palpitations.  Gastrointestinal: Negative for abdominal distention and abdominal pain.  Endocrine: Negative.   Genitourinary: Negative.   Musculoskeletal: Negative for back pain and neck pain.  Skin: Negative.   Allergic/Immunologic: Negative.   Neurological: Negative for dizziness and light-headedness.  Hematological:  Negative for adenopathy. Does not bruise/bleed easily.  Psychiatric/Behavioral: Negative for dysphoric mood and sleep disturbance (wearing NIV at night). The patient is not nervous/anxious.    Vitals:   12/29/19 1018  BP: 136/89  Pulse: 91  Resp: 20  SpO2: 92%  Weight: 273 lb 4 oz (123.9 kg)  Height: 6\' 1"  (1.854 m)   Wt Readings from Last 3 Encounters:  12/29/19 273 lb 4 oz (123.9 kg)  08/17/19 277 lb 6 oz (125.8 kg)  08/02/19 285 lb 11.2 oz (129.6 kg)   Lab Results  Component Value Date   CREATININE 1.41 (H) 08/01/2019   CREATININE 1.34 (H) 07/31/2019   CREATININE 1.38 (H) 06/14/2019    Physical Exam Vitals and nursing note reviewed.  Constitutional:      Appearance: He is well-developed.  HENT:     Head: Normocephalic and atraumatic.  Neck:     Vascular: No JVD.  Cardiovascular:  Rate and Rhythm: Normal rate and regular rhythm.  Pulmonary:     Effort: Pulmonary effort is normal.     Breath sounds: No wheezing or rales.  Abdominal:     General: There is no distension.     Palpations: Abdomen is soft.     Tenderness: There is no abdominal tenderness.  Musculoskeletal:        General: No tenderness.     Cervical back: Normal range of motion and neck supple.     Right lower leg: No tenderness. Edema (1+ pitting) present.     Left lower leg: No tenderness. Edema (1+ pitting) present.  Skin:    General: Skin is warm and dry.  Neurological:     Mental Status: He is alert and oriented to person, place, and time.  Psychiatric:        Behavior: Behavior normal.        Thought Content: Thought content normal.     Assessment & Plan:  1: Chronic heart failure with mildly reduced ejection fraction- - NYHA class II - euvolemic today - weighing daily; Reminded to call for an overnight weight gain of >2 pounds or a weekly weight gain of >5 pounds - weight down 4 pounds from last visit here 4 months ago - discussed changing his lisinopril to entresto but he would  like to think on it and wait; entresto brochure given; will have patient return in a month and re-visit this - drinking about 4-6 bottles of water daily.  - cooking with salt but not adding to food. Encouraged to not add salt at atll - saw cardiology Margarito Courser) 12/17/19 & returns 6 months - encouraged to get compression socks and put them on every morning with removal at bedtime - admits to not elevating his legs during the day and usually only at night; encouraged him to elevate his legs when sitting for long periods of time - BNP 07/27/19 was 34.0 - has received 2 covid vaccines - does not get the flu vaccine  2: HTN- - BP looks good today - sees PCP @ [employer] [employer] ([facility])  - BMP 12/17/19 reviewed and showed sodium 145, potassium 4.6, creatinine 1.4 and GFR 65  3: COPD-  - using NIV at night - wearing oxygen at 2L when at home or when he's going to be out and about for awhile - saw pulmonologist [name]) 10/02/19 - last PFT's done 02/27/2018  4: Diabetes- - last A1c was 7.0% on 06/11/19 - glucose at home today was 111 - had telemedicine visit with nephrology Smith Mince) 09/10/19 & returns 03/18/19 with labs on 03/12/19   Patient did not bring his medications nor a list. Each medication was verbally reviewed with the patient and he was encouraged to bring the bottles to every visit to confirm accuracy of list.  Return in 1 month or sooner for any questions/problems before then.

## 2019-12-29 ENCOUNTER — Encounter: Payer: Self-pay | Admitting: Family

## 2019-12-29 ENCOUNTER — Other Ambulatory Visit: Payer: Self-pay

## 2019-12-29 ENCOUNTER — Ambulatory Visit: Payer: Medicare HMO | Attending: Family | Admitting: Family

## 2019-12-29 VITALS — BP 136/89 | HR 91 | Resp 20 | Ht 73.0 in | Wt 273.2 lb

## 2019-12-29 DIAGNOSIS — I5022 Chronic systolic (congestive) heart failure: Secondary | ICD-10-CM | POA: Insufficient documentation

## 2019-12-29 DIAGNOSIS — Z79899 Other long term (current) drug therapy: Secondary | ICD-10-CM | POA: Insufficient documentation

## 2019-12-29 DIAGNOSIS — Z7951 Long term (current) use of inhaled steroids: Secondary | ICD-10-CM | POA: Insufficient documentation

## 2019-12-29 DIAGNOSIS — Z8249 Family history of ischemic heart disease and other diseases of the circulatory system: Secondary | ICD-10-CM | POA: Diagnosis not present

## 2019-12-29 DIAGNOSIS — I110 Hypertensive heart disease with heart failure: Secondary | ICD-10-CM | POA: Insufficient documentation

## 2019-12-29 DIAGNOSIS — D869 Sarcoidosis, unspecified: Secondary | ICD-10-CM | POA: Insufficient documentation

## 2019-12-29 DIAGNOSIS — Z7901 Long term (current) use of anticoagulants: Secondary | ICD-10-CM | POA: Diagnosis not present

## 2019-12-29 DIAGNOSIS — Z87891 Personal history of nicotine dependence: Secondary | ICD-10-CM | POA: Insufficient documentation

## 2019-12-29 DIAGNOSIS — Z7982 Long term (current) use of aspirin: Secondary | ICD-10-CM | POA: Insufficient documentation

## 2019-12-29 DIAGNOSIS — E119 Type 2 diabetes mellitus without complications: Secondary | ICD-10-CM | POA: Diagnosis not present

## 2019-12-29 DIAGNOSIS — G4733 Obstructive sleep apnea (adult) (pediatric): Secondary | ICD-10-CM | POA: Diagnosis not present

## 2019-12-29 DIAGNOSIS — Z794 Long term (current) use of insulin: Secondary | ICD-10-CM | POA: Insufficient documentation

## 2019-12-29 DIAGNOSIS — J441 Chronic obstructive pulmonary disease with (acute) exacerbation: Secondary | ICD-10-CM | POA: Insufficient documentation

## 2019-12-29 DIAGNOSIS — Z8673 Personal history of transient ischemic attack (TIA), and cerebral infarction without residual deficits: Secondary | ICD-10-CM | POA: Diagnosis not present

## 2019-12-29 DIAGNOSIS — Z9981 Dependence on supplemental oxygen: Secondary | ICD-10-CM | POA: Insufficient documentation

## 2019-12-29 DIAGNOSIS — I252 Old myocardial infarction: Secondary | ICD-10-CM | POA: Insufficient documentation

## 2019-12-29 DIAGNOSIS — I2510 Atherosclerotic heart disease of native coronary artery without angina pectoris: Secondary | ICD-10-CM | POA: Insufficient documentation

## 2019-12-29 DIAGNOSIS — J449 Chronic obstructive pulmonary disease, unspecified: Secondary | ICD-10-CM

## 2019-12-29 DIAGNOSIS — I5042 Chronic combined systolic (congestive) and diastolic (congestive) heart failure: Secondary | ICD-10-CM

## 2019-12-29 DIAGNOSIS — E785 Hyperlipidemia, unspecified: Secondary | ICD-10-CM | POA: Diagnosis not present

## 2019-12-29 DIAGNOSIS — I10 Essential (primary) hypertension: Secondary | ICD-10-CM

## 2019-12-29 DIAGNOSIS — Z760 Encounter for issue of repeat prescription: Secondary | ICD-10-CM | POA: Insufficient documentation

## 2019-12-29 MED ORDER — ISOSORBIDE DINITRATE 10 MG PO TABS: 10.0000 mg | ORAL_TABLET | Freq: Three times a day (TID) | ORAL | 3 refills | Status: DC

## 2019-12-29 NOTE — Patient Instructions (Addendum)
Continue weighing daily and call for an overnight weight gain of > 2 pounds or a weekly weight gain of >5 pounds. 

## 2020-01-01 ENCOUNTER — Inpatient Hospital Stay
Admission: EM | Admit: 2020-01-01 | Discharge: 2020-01-26 | DRG: 004 | Disposition: A | Discharge status: Other Acute Inpatient Hospital | Payer: Medicare HMO | Attending: Pulmonary Disease | Admitting: Pulmonary Disease

## 2020-01-01 ENCOUNTER — Other Ambulatory Visit: Payer: Self-pay

## 2020-01-01 ENCOUNTER — Emergency Department: Payer: Medicare HMO

## 2020-01-01 DIAGNOSIS — Z833 Family history of diabetes mellitus: Secondary | ICD-10-CM

## 2020-01-01 DIAGNOSIS — E875 Hyperkalemia: Secondary | ICD-10-CM | POA: Diagnosis not present

## 2020-01-01 DIAGNOSIS — Z20822 Contact with and (suspected) exposure to covid-19: Secondary | ICD-10-CM | POA: Diagnosis present

## 2020-01-01 DIAGNOSIS — D869 Sarcoidosis, unspecified: Secondary | ICD-10-CM | POA: Diagnosis present

## 2020-01-01 DIAGNOSIS — R0689 Other abnormalities of breathing: Secondary | ICD-10-CM

## 2020-01-01 DIAGNOSIS — J9602 Acute respiratory failure with hypercapnia: Secondary | ICD-10-CM | POA: Diagnosis present

## 2020-01-01 DIAGNOSIS — A419 Sepsis, unspecified organism: Secondary | ICD-10-CM | POA: Diagnosis not present

## 2020-01-01 DIAGNOSIS — Z8673 Personal history of transient ischemic attack (TIA), and cerebral infarction without residual deficits: Secondary | ICD-10-CM

## 2020-01-01 DIAGNOSIS — Z978 Presence of other specified devices: Secondary | ICD-10-CM

## 2020-01-01 DIAGNOSIS — I5082 Biventricular heart failure: Secondary | ICD-10-CM | POA: Diagnosis present

## 2020-01-01 DIAGNOSIS — Z7951 Long term (current) use of inhaled steroids: Secondary | ICD-10-CM

## 2020-01-01 DIAGNOSIS — R0602 Shortness of breath: Secondary | ICD-10-CM | POA: Diagnosis not present

## 2020-01-01 DIAGNOSIS — J449 Chronic obstructive pulmonary disease, unspecified: Secondary | ICD-10-CM | POA: Diagnosis present

## 2020-01-01 DIAGNOSIS — N179 Acute kidney failure, unspecified: Secondary | ICD-10-CM

## 2020-01-01 DIAGNOSIS — Z794 Long term (current) use of insulin: Secondary | ICD-10-CM

## 2020-01-01 DIAGNOSIS — I252 Old myocardial infarction: Secondary | ICD-10-CM

## 2020-01-01 DIAGNOSIS — G4733 Obstructive sleep apnea (adult) (pediatric): Secondary | ICD-10-CM | POA: Diagnosis present

## 2020-01-01 DIAGNOSIS — I5022 Chronic systolic (congestive) heart failure: Secondary | ICD-10-CM | POA: Diagnosis present

## 2020-01-01 DIAGNOSIS — R0902 Hypoxemia: Secondary | ICD-10-CM

## 2020-01-01 DIAGNOSIS — E119 Type 2 diabetes mellitus without complications: Secondary | ICD-10-CM

## 2020-01-01 DIAGNOSIS — Z7901 Long term (current) use of anticoagulants: Secondary | ICD-10-CM

## 2020-01-01 DIAGNOSIS — Z85528 Personal history of other malignant neoplasm of kidney: Secondary | ICD-10-CM

## 2020-01-01 DIAGNOSIS — I5042 Chronic combined systolic (congestive) and diastolic (congestive) heart failure: Secondary | ICD-10-CM | POA: Diagnosis present

## 2020-01-01 DIAGNOSIS — E7800 Pure hypercholesterolemia, unspecified: Secondary | ICD-10-CM | POA: Diagnosis present

## 2020-01-01 DIAGNOSIS — N183 Chronic kidney disease, stage 3 unspecified: Secondary | ICD-10-CM | POA: Diagnosis present

## 2020-01-01 DIAGNOSIS — Z8249 Family history of ischemic heart disease and other diseases of the circulatory system: Secondary | ICD-10-CM

## 2020-01-01 DIAGNOSIS — Z7982 Long term (current) use of aspirin: Secondary | ICD-10-CM

## 2020-01-01 DIAGNOSIS — I452 Bifascicular block: Secondary | ICD-10-CM | POA: Diagnosis present

## 2020-01-01 DIAGNOSIS — E785 Hyperlipidemia, unspecified: Secondary | ICD-10-CM | POA: Diagnosis present

## 2020-01-01 DIAGNOSIS — R6521 Severe sepsis with septic shock: Secondary | ICD-10-CM | POA: Diagnosis not present

## 2020-01-01 DIAGNOSIS — R1110 Vomiting, unspecified: Secondary | ICD-10-CM

## 2020-01-01 DIAGNOSIS — J441 Chronic obstructive pulmonary disease with (acute) exacerbation: Secondary | ICD-10-CM | POA: Diagnosis present

## 2020-01-01 DIAGNOSIS — I2510 Atherosclerotic heart disease of native coronary artery without angina pectoris: Secondary | ICD-10-CM | POA: Diagnosis present

## 2020-01-01 DIAGNOSIS — E1122 Type 2 diabetes mellitus with diabetic chronic kidney disease: Secondary | ICD-10-CM | POA: Diagnosis present

## 2020-01-01 DIAGNOSIS — Z809 Family history of malignant neoplasm, unspecified: Secondary | ICD-10-CM

## 2020-01-01 DIAGNOSIS — Z905 Acquired absence of kidney: Secondary | ICD-10-CM

## 2020-01-01 DIAGNOSIS — J9622 Acute and chronic respiratory failure with hypercapnia: Secondary | ICD-10-CM | POA: Diagnosis present

## 2020-01-01 DIAGNOSIS — I10 Essential (primary) hypertension: Secondary | ICD-10-CM | POA: Diagnosis present

## 2020-01-01 DIAGNOSIS — Z6836 Body mass index (BMI) 36.0-36.9, adult: Secondary | ICD-10-CM

## 2020-01-01 DIAGNOSIS — Z4659 Encounter for fitting and adjustment of other gastrointestinal appliance and device: Secondary | ICD-10-CM

## 2020-01-01 DIAGNOSIS — R570 Cardiogenic shock: Secondary | ICD-10-CM | POA: Diagnosis not present

## 2020-01-01 DIAGNOSIS — J9601 Acute respiratory failure with hypoxia: Secondary | ICD-10-CM | POA: Diagnosis present

## 2020-01-01 DIAGNOSIS — Z789 Other specified health status: Secondary | ICD-10-CM

## 2020-01-01 DIAGNOSIS — J9690 Respiratory failure, unspecified, unspecified whether with hypoxia or hypercapnia: Secondary | ICD-10-CM

## 2020-01-01 DIAGNOSIS — N170 Acute kidney failure with tubular necrosis: Secondary | ICD-10-CM | POA: Diagnosis not present

## 2020-01-01 DIAGNOSIS — N1831 Chronic kidney disease, stage 3a: Secondary | ICD-10-CM | POA: Diagnosis present

## 2020-01-01 DIAGNOSIS — Z880 Allergy status to penicillin: Secondary | ICD-10-CM

## 2020-01-01 DIAGNOSIS — Z9119 Patient's noncompliance with other medical treatment and regimen: Secondary | ICD-10-CM

## 2020-01-01 DIAGNOSIS — Z0189 Encounter for other specified special examinations: Secondary | ICD-10-CM

## 2020-01-01 DIAGNOSIS — G928 Other toxic encephalopathy: Secondary | ICD-10-CM | POA: Diagnosis not present

## 2020-01-01 DIAGNOSIS — J9620 Acute and chronic respiratory failure, unspecified whether with hypoxia or hypercapnia: Secondary | ICD-10-CM | POA: Diagnosis present

## 2020-01-01 DIAGNOSIS — E1169 Type 2 diabetes mellitus with other specified complication: Secondary | ICD-10-CM

## 2020-01-01 DIAGNOSIS — J189 Pneumonia, unspecified organism: Secondary | ICD-10-CM | POA: Diagnosis present

## 2020-01-01 DIAGNOSIS — Z79899 Other long term (current) drug therapy: Secondary | ICD-10-CM

## 2020-01-01 DIAGNOSIS — F17210 Nicotine dependence, cigarettes, uncomplicated: Secondary | ICD-10-CM | POA: Diagnosis present

## 2020-01-01 DIAGNOSIS — J9621 Acute and chronic respiratory failure with hypoxia: Secondary | ICD-10-CM | POA: Diagnosis present

## 2020-01-01 DIAGNOSIS — I130 Hypertensive heart and chronic kidney disease with heart failure and stage 1 through stage 4 chronic kidney disease, or unspecified chronic kidney disease: Secondary | ICD-10-CM | POA: Diagnosis present

## 2020-01-01 HISTORY — DX: Chronic kidney disease, stage 3 unspecified: N18.30

## 2020-01-01 HISTORY — DX: Malignant neoplasm of unspecified kidney, except renal pelvis: C64.9

## 2020-01-01 LAB — BLOOD GAS, ARTERIAL
Acid-Base Excess: 13.4 mmol/L — ABNORMAL HIGH (ref 0.0–2.0)
Acid-Base Excess: 12.2 mmol/L — ABNORMAL HIGH (ref 0.0–2.0)
Bicarbonate: 48.9 mmol/L — ABNORMAL HIGH (ref 20.0–28.0)
Bicarbonate: 46.5 mmol/L — ABNORMAL HIGH (ref 20.0–28.0)
Delivery systems: POSITIVE
Expiratory PAP: 12
FIO2: 0.32
FIO2: 0.3
Inspiratory PAP: 22
O2 Saturation: 90 %
O2 Saturation: 80.5 %
Patient temperature: 37
Patient temperature: 37
pCO2 arterial: >120 mmHg (ref 32.0–48.0)
pCO2 arterial: 119 mmHg (ref 32.0–48.0)
pH, Arterial: 7.17 — CL (ref 7.350–7.450)
pH, Arterial: 7.2 — ABNORMAL LOW (ref 7.350–7.450)
pO2, Arterial: 73 mmHg — ABNORMAL LOW (ref 83.0–108.0)
pO2, Arterial: 55 mmHg — ABNORMAL LOW (ref 83.0–108.0)

## 2020-01-01 LAB — CBG MONITORING, ED: Glucose-Capillary: 132 mg/dL — ABNORMAL HIGH (ref 70–99)

## 2020-01-01 LAB — RESP PANEL BY RT-PCR (FLU A&B, COVID) ARPGX2
Influenza A by PCR: NEGATIVE
Influenza B by PCR: NEGATIVE
SARS Coronavirus 2 by RT PCR: NEGATIVE

## 2020-01-01 LAB — BASIC METABOLIC PANEL
Anion gap: 9 (ref 5–15)
BUN: 20 mg/dL (ref 6–20)
CO2: 40 mmol/L — ABNORMAL HIGH (ref 22–32)
Calcium: 9.1 mg/dL (ref 8.9–10.3)
Chloride: 94 mmol/L — ABNORMAL LOW (ref 98–111)
Creatinine, Ser: 1.61 mg/dL — ABNORMAL HIGH (ref 0.61–1.24)
GFR, Estimated: 52 mL/min — ABNORMAL LOW (ref >60)
Glucose, Bld: 239 mg/dL — ABNORMAL HIGH (ref 70–99)
Potassium: 4.3 mmol/L (ref 3.5–5.1)
Sodium: 143 mmol/L (ref 135–145)

## 2020-01-01 LAB — TROPONIN I (HIGH SENSITIVITY)
Troponin I (High Sensitivity): 36 ng/L — ABNORMAL HIGH (ref <18)
Troponin I (High Sensitivity): 33 ng/L — ABNORMAL HIGH (ref <18)

## 2020-01-01 LAB — CBC
HCT: 50.3 % (ref 39.0–52.0)
Hemoglobin: 15.4 g/dL (ref 13.0–17.0)
MCH: 28.8 pg (ref 26.0–34.0)
MCHC: 30.6 g/dL (ref 30.0–36.0)
MCV: 94 fL (ref 80.0–100.0)
Platelets: 208 10*3/uL (ref 150–400)
RBC: 5.35 MIL/uL (ref 4.22–5.81)
RDW: 13.2 % (ref 11.5–15.5)
WBC: 8.6 10*3/uL (ref 4.0–10.5)
nRBC: 0 % (ref 0.0–0.2)

## 2020-01-01 LAB — BRAIN NATRIURETIC PEPTIDE: B Natriuretic Peptide: 102.4 pg/mL — ABNORMAL HIGH (ref 0.0–100.0)

## 2020-01-01 MED ORDER — IPRATROPIUM-ALBUTEROL 0.5-2.5 (3) MG/3ML IN SOLN
3.0000 mL | Freq: Once | RESPIRATORY_TRACT | Status: AC
  Administered 2020-01-01: 3 mL via RESPIRATORY_TRACT
  Filled 2020-01-01: qty 3

## 2020-01-01 MED ORDER — ENOXAPARIN SODIUM 40 MG/0.4ML ~~LOC~~ SOLN: 40.0000 mg | SUBCUTANEOUS | Status: DC

## 2020-01-01 MED ORDER — METHYLPREDNISOLONE SODIUM SUCC 40 MG IJ SOLR
40.0000 mg | Freq: Four times a day (QID) | INTRAMUSCULAR | Status: DC
  Administered 2020-01-02 (×3): 40 mg via INTRAVENOUS
  Filled 2020-01-01 (×3): qty 1

## 2020-01-01 MED ORDER — ACETAMINOPHEN 325 MG PO TABS
650.0000 mg | ORAL_TABLET | Freq: Four times a day (QID) | ORAL | Status: DC | PRN
  Administered 2020-01-10: 650 mg via ORAL
  Filled 2020-01-01: qty 2

## 2020-01-01 MED ORDER — METHYLPREDNISOLONE SODIUM SUCC 125 MG IJ SOLR
125.0000 mg | Freq: Once | INTRAMUSCULAR | Status: AC
  Administered 2020-01-01: 125 mg via INTRAVENOUS
  Filled 2020-01-01: qty 2

## 2020-01-01 MED ORDER — PREDNISONE 20 MG PO TABS: 40.0000 mg | ORAL_TABLET | Freq: Every day | ORAL | Status: DC

## 2020-01-01 MED ORDER — ONDANSETRON HCL 4 MG/2ML IJ SOLN
4.0000 mg | Freq: Four times a day (QID) | INTRAMUSCULAR | Status: DC | PRN
  Administered 2020-01-09: 4 mg via INTRAVENOUS
  Filled 2020-01-01: qty 2

## 2020-01-01 MED ORDER — INSULIN ASPART 100 UNIT/ML ~~LOC~~ SOLN
0.0000 [IU] | Freq: Three times a day (TID) | SUBCUTANEOUS | Status: DC
  Administered 2020-01-02: 3 [IU] via SUBCUTANEOUS
  Administered 2020-01-02: 4 [IU] via SUBCUTANEOUS
  Administered 2020-01-03 (×2): 3 [IU] via SUBCUTANEOUS
  Administered 2020-01-03: 5 [IU] via SUBCUTANEOUS
  Administered 2020-01-04: 3 [IU] via SUBCUTANEOUS
  Administered 2020-01-04: 4 [IU] via SUBCUTANEOUS
  Administered 2020-01-04 – 2020-01-05 (×2): 3 [IU] via SUBCUTANEOUS
  Administered 2020-01-07: 4 [IU] via SUBCUTANEOUS
  Administered 2020-01-08: 3 [IU] via SUBCUTANEOUS
  Administered 2020-01-09: 4 [IU] via SUBCUTANEOUS
  Administered 2020-01-10 – 2020-01-12 (×6): 3 [IU] via SUBCUTANEOUS
  Administered 2020-01-12 – 2020-01-13 (×2): 4 [IU] via SUBCUTANEOUS
  Administered 2020-01-13: 3 [IU] via SUBCUTANEOUS
  Administered 2020-01-14: 4 [IU] via SUBCUTANEOUS
  Administered 2020-01-14: 3 [IU] via SUBCUTANEOUS
  Administered 2020-01-15 (×2): 4 [IU] via SUBCUTANEOUS
  Administered 2020-01-16 – 2020-01-25 (×13): 3 [IU] via SUBCUTANEOUS
  Administered 2020-01-26: 08:00:00 4 [IU] via SUBCUTANEOUS
  Filled 2020-01-01 (×40): qty 1

## 2020-01-01 MED ORDER — ONDANSETRON HCL 4 MG PO TABS: 4.0000 mg | ORAL_TABLET | Freq: Four times a day (QID) | ORAL | Status: DC | PRN

## 2020-01-01 MED ORDER — ACETAMINOPHEN 650 MG RE SUPP: 650.0000 mg | Freq: Four times a day (QID) | RECTAL | Status: DC | PRN

## 2020-01-01 MED ORDER — ENOXAPARIN SODIUM 80 MG/0.8ML ~~LOC~~ SOLN
0.5000 mg/kg | SUBCUTANEOUS | Status: DC
  Administered 2020-01-01: 62.5 mg via SUBCUTANEOUS
  Filled 2020-01-01 (×2): qty 0.8

## 2020-01-01 MED ORDER — ENOXAPARIN SODIUM 60 MG/0.6ML ~~LOC~~ SOLN
0.5000 mg/kg | SUBCUTANEOUS | Status: DC
  Filled 2020-01-01: qty 1.2

## 2020-01-01 NOTE — ED Notes (Signed)
Pt with increased O2 requrirements at this time. First RN made aware.

## 2020-01-01 NOTE — ED Notes (Addendum)
Respiratory at bedside, adjusting BiPAP settings.

## 2020-01-01 NOTE — H&P (Signed)
History and Physical    [name]:[id number] DOB: [birth year] DOA: 01/01/2020  PCP: [place]   Patient coming from: Home.   I have personally briefly reviewed patient's old medical records in [place]  Chief Complaint: Chest pain and shortness of breath.  HPI: [name] is a 49 y.o. male with medical history significant of chronic combined systolic and diastolic CHF, COPD, CAD, history of MI, type 2 diabetes, hyperlipidemia, hypertension, sarcoidosis, sleep apnea not using CPAP regularly, history of other nonhemorrhagic CVA who is brought to the emergency department with complaints of chest tightness, dyspnea associated with clear sputum productive cough that started about 2 to 3 weeks ago and has become progressively worse.  No fever, chills, nausea or vomiting. He has been very somnolent at home.  No diarrhea, constipation or urinary symptoms.  He is currently on BiPAP ventilation and unable to provide much history.  ED Course: Initial vital signs were temperature 98.4 F, pulse 87, respiration 19, blood pressure 137/81 mmHg O2 sat 100% on room air.  The patient received supplemental oxygen, bronchodilators, Solu-Medrol and was placed on BiPAP ventilation mode after his initial ABG came back with a pH of 7.17 and PCO2 of more than 120.0 mmHg.  Repeat ABG showed mildly improved pH at 7.20 and PCO2 of 119 mmHg.  I discussed this with Elink (Dr. Gillermina Phy) who recommended to continue with BiPAP and repeating ABG in a.m.  His CBC was normal.  Troponin was 36 and then 33 ng/dL.  BMP shows CO2 of 40 mmol/L.  Glucose 239 and creatinine 1.61 mg/dL.  Renal function slightly elevated over baseline.  Review of Systems: As per HPI otherwise all other systems reviewed and are negative.  Past Medical History:  Diagnosis Date  . CHF (congestive heart failure) ([place])   . COPD (chronic obstructive pulmonary disease) ([place])   . Coronary artery disease   . Diabetes  mellitus without complication ([place])   . Hypercholesteremia unk  . Hypertension   . Myocardial infarction ([place])   . Sarcoidosis   . Sleep apnea    does not use CPAP regularly.  . Stroke [facility])    Past Surgical History:  Procedure Laterality Date  . APPENDECTOMY    . PARTIAL NEPHRECTOMY      Social History  reports that he has been smoking cigarettes. He has a 6.25 pack-year smoking history. He has never used smokeless tobacco. He reports previous alcohol use. He reports current drug use. Drugs: Marijuana and PCP.  Allergies  Allergen Reactions  . Penicillins Anaphylaxis and Other (See Comments)    Has patient had a PCN reaction causing immediate rash, facial/tongue/throat swelling, SOB or lightheadedness with hypotension: Yes Has patient had a PCN reaction causing severe rash involving mucus membranes or skin necrosis: No Has patient had a PCN reaction that required hospitalization: No Has patient had a PCN reaction occurring within the last 10 years: No If all of the above answers are "NO", then may proceed with Cephalosporin use.    Family History  Problem Relation Age of Onset  . Hypertension Mother   . Heart disease Mother   . Diabetes Mother   . Cancer Father    Prior to Admission medications   Medication Sig Start Date End Date Taking? Authorizing Provider  albuterol (VENTOLIN HFA) 108 (90 Base) MCG/ACT inhaler Inhale 1-2 puffs into the lungs every 4 (four) hours as needed for wheezing or shortness of breath.   Yes [provider]  apixaban (ELIQUIS) 5 MG TABS tablet Take 5 mg by mouth 2 (two) times daily. 06/11/16  Yes [provider]  aspirin 81 MG EC tablet Take 81 mg by mouth at bedtime.    Yes [provider]  atorvastatin (LIPITOR) 40 MG tablet Take 40 mg by mouth at bedtime.    Yes [provider]  Fluticasone-Salmeterol (ADVAIR) 250-50 MCG/DOSE AEPB Inhale 1 puff into the lungs 2 (two) times daily.   Yes [provider]   furosemide (LASIX) 80 MG tablet Take 80 mg by mouth 2 (two) times daily.   Yes [provider]  insulin aspart (NOVOLOG) 100 UNIT/ML injection Inject 5-15 Units into the skin 3 (three) times daily before meals.    Yes [provider]  insulin detemir (LEVEMIR) 100 UNIT/ML injection Inject 40-50 Units into the skin at bedtime.    Yes [provider]  ipratropium-albuterol (DUONEB) 0.5-2.5 (3) MG/3ML SOLN Inhale 3 mLs into the lungs 4 (four) times daily as needed for wheezing. 09/11/18  Yes [provider]  isosorbide dinitrate (ISORDIL) 10 MG tablet Take 1 tablet (10 mg total) by mouth 3 (three) times daily. 12/29/19  Yes Hackney, Tina A, FNP  lisinopril (ZESTRIL) 30 MG tablet Take 30 mg by mouth at bedtime.    Yes [provider]  Melatonin 5 MG TABS Take 5 mg by mouth at bedtime as needed for sleep.   Yes [provider]  metoprolol tartrate (LOPRESSOR) 100 MG tablet Take 1 tablet (100 mg total) by mouth 2 (two) times daily. Patient taking differently: Take 100 mg by mouth at bedtime.  07/30/18  Yes Fritzi Mandes, MD  potassium chloride SA (KLOR-CON) 20 MEQ tablet Take 20 mEq by mouth at bedtime.    Yes [provider]  theophylline (THEODUR) 300 MG 12 hr tablet Take 300 mg by mouth 2 (two) times daily.    Yes [provider]  tiotropium (SPIRIVA) 18 MCG inhalation capsule Place 18 mcg into inhaler and inhale daily.    [provider]   Physical Exam: Vitals:   01/01/20 1815 01/01/20 1900 01/01/20 1915 01/01/20 1930  BP: (!) 142/86 132/74  (!) 141/89  Pulse: 91 87 89 92  Resp: (!) 30 (!) 26 (!) 22   Temp:      SpO2: 92% 98% 99% 93%  Weight:      Height:       Constitutional: Looks chronically ill.  On BiPAP ventilation. Eyes: PERRL, lids and conjunctivae mildly injected. ENMT: BiPAP mask on.  Mucous membranes are dry.. Posterior pharynx clear of any exudate or lesions. Neck: normal, supple, no masses, no  thyromegaly Respiratory: On BiPAP ventilation, tachypneic in the 20s and 30s, mild wheezing bilaterally, no crackles..  Cardiovascular: Regular rate and rhythm, no murmurs / rubs / gallops.  Stage I lymphedema with 1+ lower extremity pitting edema. 2+ pedal pulses. No carotid bruits.  Abdomen: Obese, nondistended.  Bowel sounds positive.  Soft, no tenderness, no masses palpated. No hepatosplenomegaly. Musculoskeletal: no clubbing / cyanosis. Good ROM, no contractures.  Relaxed muscle tone.  Skin: Some areas of ecchymosis on very limited dermatological examination.. Neurologic: CN 2-12 grossly intact. Sensation intact, DTR normal. Strength 4 /5 in all 4.  Psychiatric: Somnolent.  Wakes up briefly.  Labs on Admission: I have personally reviewed following labs and imaging studies  CBC: Recent Labs  Lab 01/01/20 1117  WBC 8.6  HGB 15.4  HCT 50.3  MCV 94.0  PLT 208  Basic Metabolic Panel: Recent Labs  Lab 01/01/20 1117  NA 143  K 4.3  CL 94*  CO2 40*  GLUCOSE 239*  BUN 20  CREATININE 1.61*  CALCIUM 9.1    GFR: Estimated Creatinine Clearance: 76.5 mL/min (A) (by C-G formula based on SCr of 1.61 mg/dL (H)).  Liver Function Tests: No results for input(s): AST, ALT, ALKPHOS, BILITOT, PROT, ALBUMIN in the last 168 hours.   Radiological Exams on Admission: DG Chest 2 View  Result Date: 01/01/2020 CLINICAL DATA:  Chest pain EXAM: CHEST - 2 VIEW COMPARISON:  07/31/2019 FINDINGS: Linear areas of scarring within the lungs bilaterally. Heart is normal size. No acute confluent opacities or effusions. No acute bony abnormality. IMPRESSION: Areas of scarring bilaterally, stable.  No active disease. Electronically Signed   By: Rolm Baptise M.D.   On: 01/01/2020 11:42    EKG: Independently reviewed.  Vent. rate 100 BPM PR interval 146 ms QRS duration 158 ms QT/QTc 382/492 ms P-R-T axes 49 270 13 Normal sinus rhythm Right bundle branch block Left anterior fascicular block   Bifascicular block  Minimal voltage criteria for LVH, may be normal variant ( R in aVL ) Inferior infarct , age undetermined  Assessment/Plan Principal problem:   Acute respiratory failure with hypoxia and hypercapnia (HCC) Admit to stepdown/observation. Discussed with PCCM/[facility] on call (Dr. Gillermina Phy). Continue BiPAP ventilation mode. Continue COPD as observation treatment. Continue CHF treatment. Recheck ABG in the morning.  Active Problems:   COPD exacerbation (HCC) Continue supplemental oxygen. Continue BiPAP ventilation mode. Continue bronchodilators. Continue Solu-Medrol followed by prednisone.    OSA and COPD overlap syndrome (HCC) Smoking cessation advised. Needs to use his CPAP regularly at home.    Chronic combined systolic and diastolic congestive heart failure ([place]) Compensated at this time. Sodium and fluid restriction. Resume lisinopril, metoprolol, nitrates and furosemide once he is awake.    Hypercholesteremia Continue atorvastatin 40 mg p.o. daily. Follow-up with primary.    Type 2 diabetes mellitus (HCC) Carbohydrate modified diet. Hold Levemir while somnolent. CBG monitoring with RI SS.    HTN (hypertension) Blood pressure has been soft at times. However the patient is currently on BiPAP ventilation mode. Resume antihypertensives in a.m.    CKD stage 3 due to type 2 diabetes mellitus (HCC) Monitor renal function electrolytes. Resume lisinopril once more alert. Follow-up with PCP and nephrology.     DVT prophylaxis: On apixaban. Code Status:   Full code. Family Communication: Disposition Plan:   Patient is from:  Home.  Anticipated DC to:  Home.  Anticipated DC date:  01/03/2020.  Anticipated DC barriers: Clinical status.  Consults called: Admission status:  Observation/stepdown.  High due to acute on chronic respiratory failure with hypoxia and hypercapnia.  Severity of Illness:  Reubin Milan MD [facility]  How to  contact the [facility] Attending or Consulting provider [place] or covering provider during after hours [place], for this patient?   1. Check the care team in [facility] and look for a) attending/consulting [facility] provider listed and b) the [facility] team listed 2. Log into [email or web address] and use [place]'s universal password to access. If you do not have the password, please contact the hospital operator. 3. Locate the [facility] provider you are looking for under [facility] and page to a number that you can be directly reached. 4. If you still have difficulty reaching the provider, please page the [facility] (Director on Call) for the Hospitalists listed on amion for assistance.  01/01/2020, 8:46 PM   This document was prepared using Dragon voice recognition software and may contain some unintended transcription errors.

## 2020-01-01 NOTE — ED Notes (Signed)
Medication Reconciliation Report  For Home History Technicians  HIGHLIGHTS:  1. The patient WAS NOT personally interviewed 2. If not, what was the main source used: PHARMACY RECORDS 3. Does the patient appear to take any anti-coagulation agents (e.g. warfarin, Eliquis or Xarelto): YES 4. Does the patient appear to take any anti-convulsant agents (e.g. divalproex, levetiracetam or phenytoin): NO 5. Does the patient appear to use any insulin products (e.g. Lantus, Novolin or Humalog): YES 6. Does the patient appear to take any "beta-blockers" (e.g. metoprolol, carvedilol or bisoprolol: YES  BARRIERS:  1. Were there any barriers that prevented or complicated the medication reconciliation process: YES 2. If yes, what was the primary barrier encountered: Condition prevents interview 3. Does the patient appear compliant with prescribed medications: YES 4. Does the patient express any barriers with compliance: UNABLE TO DETERMINE 5. What is the primary barrier the patient reports: None   NOTES:[Include any concerns, remarks or complaints the patient expresses regarding medication therapy. Any observations or other information that might be useful to the treatment team can also be included. Immediate needs or concerns should be referred to the RN or appropriate member of the treatment team.]  Patient currently on BiPAP and appears to be resting. Unable to confirm last doses taken. Medication history from available pharmacy dispense records.  Colen Darling, CPhT [place] at [facility]. [place], [place] [address] [phone]  ** The above is intended solely for informational and/or communicative purposes. It should in no way be considered an endorsement of any specific treatment, therapy or action. **

## 2020-01-01 NOTE — ED Provider Notes (Signed)
[facility] Emergency Department Provider Note  ____________________________________________   I have reviewed the triage vital signs and the nursing notes.   HISTORY  Chief Complaint Chest Pain and Shortness of Breath   History limited by: Not Limited   HPI [name] is a 49 y.o. male who presents to the emergency department today because of concern for chest tightness and shortness of breath. The patient states that the symptoms have been ongoing for the past 2-3 weeks. He does not feel particularly worse today but states that his wife made him come in. The patient has had a cough productive of clear phlegm. The patient denies any fevers. He says he has chronic leg swelling.    Records reviewed. Per medical record review patient has a history of CHF, COPD, CAD, DM, HLD, HTN.   Past Medical History:  Diagnosis Date  . CHF (congestive heart failure) ([place])   . COPD (chronic obstructive pulmonary disease) ([place])   . Coronary artery disease   . Diabetes mellitus without complication ([place])   . Hypercholesteremia unk  . Hypertension   . Myocardial infarction ([place])   . Sarcoidosis   . Sleep apnea    does not use CPAP regularly.  . Stroke [facility])     Patient Active Problem List   Diagnosis Date Noted  . Respiratory failure with hypercapnia ([place]) 07/31/2019  . Acute on chronic respiratory failure with hypoxia ([place]) 06/10/2019  . Acute on chronic respiratory failure with hypoxia and hypercapnia ([place]) 11/17/2018  . Acute exacerbation of CHF (congestive heart failure) ([place]) 11/07/2018  . Chest pain 11/06/2018  . Acute exacerbation of chronic obstructive pulmonary disease (COPD) ([place]) 07/29/2018  . COPD with acute exacerbation ([place]) 02/03/2018  . COPD (chronic obstructive pulmonary disease) ([place]) 07/16/2016  . Chronic diastolic heart failure ([place]) 03/23/2016  . Hypercarbia   . COPD exacerbation ([place]) 11/17/2015  . HTN (hypertension) 06/14/2015  .  Dental caries 03/08/2015  . Morbid obesity ([place]) 02/21/2015  . OSA and COPD overlap syndrome ([place]) 02/21/2015  . Thrombocytopenia ([place]) 02/21/2015  . Hypernatremia 02/21/2015  . Diabetes ([place]) 10/19/2014  . Chronic combined systolic and diastolic congestive heart failure ([place]) 10/18/2014  . Chronic obstructive pulmonary disease ([place]) 10/18/2014  . Sarcoidosis 04/22/2013  . Other and unspecified hyperlipidemia 04/22/2013    Past Surgical History:  Procedure Laterality Date  . APPENDECTOMY    . PARTIAL NEPHRECTOMY      Prior to Admission medications   Medication Sig Start Date End Date Taking? Authorizing Provider  albuterol (VENTOLIN HFA) 108 (90 Base) MCG/ACT inhaler Inhale 1-2 puffs into the lungs every 4 (four) hours as needed for wheezing or shortness of breath.    [provider]  apixaban (ELIQUIS) 5 MG TABS tablet Take 5 mg by mouth 2 (two) times daily. 06/11/16   [provider]  aspirin 81 MG EC tablet Take 81 mg by mouth at bedtime.     [provider]  atorvastatin (LIPITOR) 40 MG tablet Take 40 mg by mouth at bedtime.     [provider]  Fluticasone-Salmeterol (ADVAIR) 250-50 MCG/DOSE AEPB Inhale 1 puff into the lungs 2 (two) times daily.    [provider]  furosemide (LASIX) 80 MG tablet Take 1 tablet (80 mg total) by mouth 2 (two) times daily. 60 mg p.o. twice daily for 4 days , resume home dose thereafter. 11/21/18   Epifanio Lesches, MD  insulin aspart (NOVOLOG) 100 UNIT/ML injection Inject into the skin 3 (three)  times daily before meals. 5-15 units sliding scale before meals    [provider]  insulin detemir (LEVEMIR) 100 UNIT/ML injection Inject 40-50 Units into the skin at bedtime.     [provider]  ipratropium-albuterol (DUONEB) 0.5-2.5 (3) MG/3ML SOLN Inhale 3 mLs into the lungs 4 (four) times daily as needed for wheezing. 09/11/18   [provider]  isosorbide dinitrate (ISORDIL) 10 MG  tablet Take 1 tablet (10 mg total) by mouth 3 (three) times daily. 12/29/19   Darylene Price A, FNP  lisinopril (ZESTRIL) 30 MG tablet Take 30 mg by mouth at bedtime.     [provider]  Melatonin 5 MG TABS Take 5 mg by mouth at bedtime as needed for sleep.    [provider]  metoprolol tartrate (LOPRESSOR) 100 MG tablet Take 1 tablet (100 mg total) by mouth 2 (two) times daily. Patient taking differently: Take 100 mg by mouth at bedtime.  07/30/18   Fritzi Mandes, MD  potassium chloride SA (KLOR-CON) 20 MEQ tablet Take 20 mEq by mouth at bedtime.     [provider]  theophylline (THEODUR) 300 MG 12 hr tablet Take 300 mg by mouth 2 (two) times daily.     [provider]  tiotropium (SPIRIVA) 18 MCG inhalation capsule Place 18 mcg into inhaler and inhale daily.    [provider]    Allergies Penicillins  Family History  Problem Relation Age of Onset  . Hypertension Mother   . Heart disease Mother   . Diabetes Mother   . Cancer Father     Social History Social History   Tobacco Use  . Smoking status: Current Every Day Smoker    Packs/day: 0.25    Years: 25.00    Pack years: 6.25    Types: Cigarettes    Last attempt to quit: 02/05/2015    Years since quitting: 4.9  . Smokeless tobacco: Never Used  Vaping Use  . Vaping Use: Never used  Substance Use Topics  . Alcohol use: Not Currently    Alcohol/week: 0.0 standard drinks    Comment: occassional  . Drug use: Yes    Types: Marijuana, PCP    Comment: 4 weeks    Review of Systems Constitutional: No fever/chills Eyes: No visual changes. ENT: No sore throat. Cardiovascular: Positive for chest tightness Respiratory: Positive for shortness of breath. Gastrointestinal: No abdominal pain.  No nausea, no vomiting.  No diarrhea.   Genitourinary: Negative for dysuria. Musculoskeletal: Positive for bilateral leg swelling.  Skin: Negative for rash. Neurological: Negative for headaches,  focal weakness or numbness.  ____________________________________________   PHYSICAL EXAM:  VITAL SIGNS: ED Triage Vitals  Enc Vitals Group     BP 01/01/20 1108 137/81     Pulse Rate 01/01/20 1108 87     Resp 01/01/20 1108 19     Temp 01/01/20 1108 98.4 F (36.9 C)     Temp src --      SpO2 01/01/20 1108 100 %     Weight 01/01/20 1114 273 lb 4 oz (123.9 kg)     Height 01/01/20 1114 6\' 1"  (1.854 m)     Head Circumference --      Peak Flow --      Pain Score 01/01/20 1114 5   Constitutional: Somnolent. Awakens to verbal stimuli.  Eyes: Conjunctivae are normal.  ENT      Head: Normocephalic and atraumatic.      Nose: No congestion/rhinnorhea.  Mouth/Throat: Mucous membranes are moist.      Neck: No stridor. Hematological/Lymphatic/Immunilogical: No cervical lymphadenopathy. Cardiovascular: Normal rate, regular rhythm.  No murmurs, rubs, or gallops.  Respiratory: Decreased breath sounds diffusely throughout the lungs. Poor air movement.  Gastrointestinal: Soft and non tender. No rebound. No guarding.  Genitourinary: Deferred Musculoskeletal: Normal range of motion in all extremities. 1+ bilateral lower extremity swelling.  Neurologic:  Somnolent. Awakens to verbal stimuli. Moving all extremities.  Skin:  Skin is warm, dry and intact. No rash noted. ____________________________________________    LABS (pertinent positives/negatives)  Trop hs 36 CBC wbc 8.6, hgb 15.4, plt 208 BMP na 143, k 4.3, glu 239, cr 1.61 ABG pH 7.17, pCO2 >129.0 ____________________________________________   EKG  I, Nance Pear, attending physician, personally viewed and interpreted this EKG  EKG Time: 1110 Rate: 100 Rhythm: normal sinus rhythm Axis: left axis deviation Intervals: qtc 492 QRS: RBBB, LAFB ST changes: no st elevation Impression: abnormal ekg  ____________________________________________    RADIOLOGY  CXR No acute disease. Stable areas of  scarring.  ____________________________________________   PROCEDURES  Procedures  CRITICAL CARE Performed by: Nance Pear   Total critical care time: 30 minutes  Critical care time was exclusive of separately billable procedures and treating other patients.  Critical care was necessary to treat or prevent imminent or life-threatening deterioration.  Critical care was time spent personally by me on the following activities: development of treatment plan with patient and/or surrogate as well as nursing, discussions with consultants, evaluation of patient's response to treatment, examination of patient, obtaining history from patient or surrogate, ordering and performing treatments and interventions, ordering and review of laboratory studies, ordering and review of radiographic studies, pulse oximetry and re-evaluation of patient's condition.  ____________________________________________   INITIAL IMPRESSION / ASSESSMENT AND PLAN / ED COURSE  Pertinent labs & imaging results that were available during my care of the patient were reviewed by me and considered in my medical decision making (see chart for details).   Patient presented to the emergency department today because of concerns for shortness of breath and chest tightness.  Patient states symptoms have been going on for the past few weeks.  On exam patient is somnolent although awakens easily to verbal stimuli.  Auscultation is concerning for poor air movement.  Patient did have some lower extremity edema.  Chest x-ray without however any acute pulmonary edema.  Did check ABG given altered mental status if he was found to be significantly hypercapnic.  Because of this BiPAP was ordered.  At this point I think likely patient with COPD exacerbation resulting in hypercapnia.  Will plan on admission.  ____________________________________________   FINAL CLINICAL IMPRESSION(S) / ED DIAGNOSES  Final diagnoses:  Shortness of  breath  Hypercapnia     Note: This dictation was prepared with Dragon dictation. Any transcriptional errors that result from this process are unintentional     Nance Pear, MD 01/01/20 1807

## 2020-01-01 NOTE — ED Notes (Signed)
This RN called wife to update on POC and pt status.

## 2020-01-01 NOTE — ED Notes (Signed)
RT called to collect ABG

## 2020-01-01 NOTE — ED Notes (Signed)
RT at bedside to place pt on BiPAP

## 2020-01-01 NOTE — ED Notes (Signed)
This RN attempted IV access x2 without success.

## 2020-01-01 NOTE — Progress Notes (Signed)
PHARMACIST - PHYSICIAN COMMUNICATION  CONCERNING:  Enoxaparin (Lovenox) for DVT Prophylaxis    RECOMMENDATION: Patient was prescribed enoxaparin 40mg  q24 hours for VTE prophylaxis.   Filed Weights   01/01/20 1114  Weight: 123.9 kg (273 lb 4 oz)    Body mass index is 36.05 kg/m.  Estimated Creatinine Clearance: 76.5 mL/min (A) (by C-G formula based on SCr of 1.61 mg/dL (H)).   Based on [place] patient is candidate for enoxaparin 0.5mg /kg TBW SQ every 24 hours based on BMI being >30.  DESCRIPTION: Pharmacy has adjusted enoxaparin dose per [facility] policy.  Patient is now receiving enoxaparin 62.5 mg every 24 hours   Benita Gutter 01/01/2020 7:58 PM

## 2020-01-01 NOTE — ED Notes (Signed)
This RN updated pt's wife on pt's phone

## 2020-01-01 NOTE — ED Notes (Signed)
Pt O2 saturation increased to 94% at this time with RTs BiPAP setting adjustments. MD Olevia Bowens made aware.

## 2020-01-01 NOTE — ED Triage Notes (Signed)
Pt comes via POV from home with c/o left sided CP and SOB. Pt states this started week ago but has progressed. Pt states tightness and increased SOB. Pt wears 2L [place] chronically. Pt denies any radiation.

## 2020-01-01 NOTE — ED Notes (Signed)
Pt decreased to 3L [place] due to pt oxygen sats maintained at 100% on 4L [place]

## 2020-01-01 NOTE — ED Notes (Signed)
RT called to place BiPAP on pt.

## 2020-01-01 NOTE — ED Notes (Signed)
Pt oxygen decreased to 2L [name]. Pt sats 88%. Pt increased to 3L [name] with improvement to 91%. MD made aware.

## 2020-01-02 ENCOUNTER — Inpatient Hospital Stay: Payer: Medicare HMO

## 2020-01-02 DIAGNOSIS — J9601 Acute respiratory failure with hypoxia: Secondary | ICD-10-CM

## 2020-01-02 DIAGNOSIS — R0602 Shortness of breath: Secondary | ICD-10-CM | POA: Diagnosis present

## 2020-01-02 DIAGNOSIS — E119 Type 2 diabetes mellitus without complications: Secondary | ICD-10-CM

## 2020-01-02 DIAGNOSIS — J449 Chronic obstructive pulmonary disease, unspecified: Secondary | ICD-10-CM | POA: Diagnosis not present

## 2020-01-02 DIAGNOSIS — J9600 Acute respiratory failure, unspecified whether with hypoxia or hypercapnia: Secondary | ICD-10-CM | POA: Diagnosis present

## 2020-01-02 DIAGNOSIS — Z6836 Body mass index (BMI) 36.0-36.9, adult: Secondary | ICD-10-CM | POA: Diagnosis not present

## 2020-01-02 DIAGNOSIS — Z20822 Contact with and (suspected) exposure to covid-19: Secondary | ICD-10-CM | POA: Diagnosis present

## 2020-01-02 DIAGNOSIS — I10 Essential (primary) hypertension: Secondary | ICD-10-CM

## 2020-01-02 DIAGNOSIS — J9602 Acute respiratory failure with hypercapnia: Secondary | ICD-10-CM | POA: Diagnosis not present

## 2020-01-02 DIAGNOSIS — E785 Hyperlipidemia, unspecified: Secondary | ICD-10-CM | POA: Diagnosis present

## 2020-01-02 DIAGNOSIS — D860 Sarcoidosis of lung: Secondary | ICD-10-CM | POA: Diagnosis not present

## 2020-01-02 DIAGNOSIS — I452 Bifascicular block: Secondary | ICD-10-CM | POA: Diagnosis present

## 2020-01-02 DIAGNOSIS — I5042 Chronic combined systolic (congestive) and diastolic (congestive) heart failure: Secondary | ICD-10-CM

## 2020-01-02 DIAGNOSIS — Z794 Long term (current) use of insulin: Secondary | ICD-10-CM

## 2020-01-02 DIAGNOSIS — F17210 Nicotine dependence, cigarettes, uncomplicated: Secondary | ICD-10-CM | POA: Diagnosis present

## 2020-01-02 DIAGNOSIS — N183 Chronic kidney disease, stage 3 unspecified: Secondary | ICD-10-CM

## 2020-01-02 DIAGNOSIS — J189 Pneumonia, unspecified organism: Secondary | ICD-10-CM | POA: Diagnosis present

## 2020-01-02 DIAGNOSIS — A419 Sepsis, unspecified organism: Secondary | ICD-10-CM | POA: Diagnosis present

## 2020-01-02 DIAGNOSIS — Z930 Tracheostomy status: Secondary | ICD-10-CM | POA: Diagnosis not present

## 2020-01-02 DIAGNOSIS — I130 Hypertensive heart and chronic kidney disease with heart failure and stage 1 through stage 4 chronic kidney disease, or unspecified chronic kidney disease: Secondary | ICD-10-CM | POA: Diagnosis present

## 2020-01-02 DIAGNOSIS — N179 Acute kidney failure, unspecified: Secondary | ICD-10-CM | POA: Diagnosis not present

## 2020-01-02 DIAGNOSIS — N170 Acute kidney failure with tubular necrosis: Secondary | ICD-10-CM | POA: Diagnosis not present

## 2020-01-02 DIAGNOSIS — Z905 Acquired absence of kidney: Secondary | ICD-10-CM | POA: Diagnosis not present

## 2020-01-02 DIAGNOSIS — G4733 Obstructive sleep apnea (adult) (pediatric): Secondary | ICD-10-CM

## 2020-01-02 DIAGNOSIS — J9622 Acute and chronic respiratory failure with hypercapnia: Secondary | ICD-10-CM | POA: Diagnosis present

## 2020-01-02 DIAGNOSIS — J9621 Acute and chronic respiratory failure with hypoxia: Secondary | ICD-10-CM | POA: Diagnosis not present

## 2020-01-02 DIAGNOSIS — E7800 Pure hypercholesterolemia, unspecified: Secondary | ICD-10-CM | POA: Diagnosis present

## 2020-01-02 DIAGNOSIS — J441 Chronic obstructive pulmonary disease with (acute) exacerbation: Secondary | ICD-10-CM | POA: Diagnosis present

## 2020-01-02 DIAGNOSIS — Z7901 Long term (current) use of anticoagulants: Secondary | ICD-10-CM | POA: Diagnosis not present

## 2020-01-02 DIAGNOSIS — J9620 Acute and chronic respiratory failure, unspecified whether with hypoxia or hypercapnia: Secondary | ICD-10-CM

## 2020-01-02 DIAGNOSIS — E1122 Type 2 diabetes mellitus with diabetic chronic kidney disease: Secondary | ICD-10-CM | POA: Diagnosis present

## 2020-01-02 DIAGNOSIS — G928 Other toxic encephalopathy: Secondary | ICD-10-CM | POA: Diagnosis not present

## 2020-01-02 DIAGNOSIS — R6521 Severe sepsis with septic shock: Secondary | ICD-10-CM | POA: Diagnosis not present

## 2020-01-02 DIAGNOSIS — R570 Cardiogenic shock: Secondary | ICD-10-CM | POA: Diagnosis not present

## 2020-01-02 DIAGNOSIS — I2510 Atherosclerotic heart disease of native coronary artery without angina pectoris: Secondary | ICD-10-CM | POA: Diagnosis present

## 2020-01-02 DIAGNOSIS — Z515 Encounter for palliative care: Secondary | ICD-10-CM | POA: Diagnosis not present

## 2020-01-02 DIAGNOSIS — Z7189 Other specified counseling: Secondary | ICD-10-CM | POA: Diagnosis not present

## 2020-01-02 DIAGNOSIS — D869 Sarcoidosis, unspecified: Secondary | ICD-10-CM | POA: Diagnosis present

## 2020-01-02 LAB — BLOOD GAS, ARTERIAL
Acid-Base Excess: 10.9 mmol/L — ABNORMAL HIGH (ref 0.0–2.0)
Acid-Base Excess: 11.4 mmol/L — ABNORMAL HIGH (ref 0.0–2.0)
Bicarbonate: 44.4 mmol/L — ABNORMAL HIGH (ref 20.0–28.0)
Bicarbonate: 45.3 mmol/L — ABNORMAL HIGH (ref 20.0–28.0)
Delivery systems: POSITIVE
Expiratory PAP: 10
Expiratory PAP: 10
FIO2: 0.4
FIO2: 0.4
Inspiratory PAP: 20
Inspiratory PAP: 20
O2 Saturation: 88 %
O2 Saturation: 91 %
Patient temperature: 37
Patient temperature: 37
pCO2 arterial: 111 mmHg (ref 32.0–48.0)
pCO2 arterial: 116 mmHg (ref 32.0–48.0)
pH, Arterial: 7.21 — ABNORMAL LOW (ref 7.350–7.450)
pH, Arterial: 7.2 — ABNORMAL LOW (ref 7.350–7.450)
pO2, Arterial: 66 mmHg — ABNORMAL LOW (ref 83.0–108.0)
pO2, Arterial: 74 mmHg — ABNORMAL LOW (ref 83.0–108.0)

## 2020-01-02 LAB — COMPREHENSIVE METABOLIC PANEL
ALT: 21 U/L (ref 0–44)
AST: 17 U/L (ref 15–41)
Albumin: 3.7 g/dL (ref 3.5–5.0)
Alkaline Phosphatase: 72 U/L (ref 38–126)
Anion gap: 7 (ref 5–15)
BUN: 23 mg/dL — ABNORMAL HIGH (ref 6–20)
CO2: 40 mmol/L — ABNORMAL HIGH (ref 22–32)
Calcium: 8.5 mg/dL — ABNORMAL LOW (ref 8.9–10.3)
Chloride: 99 mmol/L (ref 98–111)
Creatinine, Ser: 1.55 mg/dL — ABNORMAL HIGH (ref 0.61–1.24)
GFR, Estimated: 55 mL/min — ABNORMAL LOW (ref >60)
Glucose, Bld: 173 mg/dL — ABNORMAL HIGH (ref 70–99)
Potassium: 5.7 mmol/L — ABNORMAL HIGH (ref 3.5–5.1)
Sodium: 146 mmol/L — ABNORMAL HIGH (ref 135–145)
Total Bilirubin: 0.5 mg/dL (ref 0.3–1.2)
Total Protein: 7.2 g/dL (ref 6.5–8.1)

## 2020-01-02 LAB — HEMOGLOBIN A1C
Hgb A1c MFr Bld: 6.5 % — ABNORMAL HIGH (ref 4.8–5.6)
Mean Plasma Glucose: 139.85 mg/dL

## 2020-01-02 LAB — GLUCOSE, CAPILLARY
Glucose-Capillary: 138 mg/dL — ABNORMAL HIGH (ref 70–99)
Glucose-Capillary: 133 mg/dL — ABNORMAL HIGH (ref 70–99)

## 2020-01-02 LAB — MRSA PCR SCREENING: MRSA by PCR: NEGATIVE

## 2020-01-02 LAB — CBG MONITORING, ED
Glucose-Capillary: 172 mg/dL — ABNORMAL HIGH (ref 70–99)
Glucose-Capillary: 111 mg/dL — ABNORMAL HIGH (ref 70–99)

## 2020-01-02 MED ORDER — CHLORHEXIDINE GLUCONATE CLOTH 2 % EX PADS
6.0000 | MEDICATED_PAD | Freq: Every day | CUTANEOUS | Status: DC
  Administered 2020-01-03 – 2020-01-25 (×23): 6 via TOPICAL

## 2020-01-02 MED ORDER — CHLORHEXIDINE GLUCONATE 0.12% ORAL RINSE (MEDLINE KIT)
15.0000 mL | Freq: Two times a day (BID) | OROMUCOSAL | Status: DC
  Administered 2020-01-02 – 2020-01-26 (×48): 15 mL via OROMUCOSAL

## 2020-01-02 MED ORDER — NOREPINEPHRINE 4 MG/250ML-% IV SOLN
0.0000 ug/min | INTRAVENOUS | Status: DC
  Administered 2020-01-02: 2 ug/min via INTRAVENOUS
  Administered 2020-01-03: 4 ug/min via INTRAVENOUS
  Administered 2020-01-03: 5.013 ug/min via INTRAVENOUS
  Filled 2020-01-02 (×3): qty 250

## 2020-01-02 MED ORDER — FENTANYL CITRATE (PF) 100 MCG/2ML IJ SOLN
INTRAMUSCULAR | Status: DC | PRN
Start: 2020-01-02 — End: 2020-01-05
  Administered 2020-01-02: 200 ug via INTRAVENOUS

## 2020-01-02 MED ORDER — FENTANYL BOLUS VIA INFUSION
50.0000 ug | INTRAVENOUS | Status: DC | PRN
  Administered 2020-01-04 – 2020-01-15 (×8): 50 ug via INTRAVENOUS
  Filled 2020-01-02: qty 50

## 2020-01-02 MED ORDER — METHYLPREDNISOLONE SODIUM SUCC 40 MG IJ SOLR
40.0000 mg | Freq: Two times a day (BID) | INTRAMUSCULAR | Status: DC
  Administered 2020-01-02 – 2020-01-04 (×4): 40 mg via INTRAVENOUS
  Filled 2020-01-02 (×4): qty 1

## 2020-01-02 MED ORDER — MIDAZOLAM HCL 2 MG/2ML IJ SOLN
2.0000 mg | INTRAMUSCULAR | Status: DC | PRN
  Administered 2020-01-02 – 2020-01-06 (×3): 2 mg via INTRAVENOUS
  Filled 2020-01-02 (×3): qty 2

## 2020-01-02 MED ORDER — FENTANYL 2500MCG IN NS 250ML (10MCG/ML) PREMIX INFUSION
0.0000 ug/h | INTRAVENOUS | Status: DC
  Administered 2020-01-02: 50 ug/h via INTRAVENOUS
  Administered 2020-01-02 – 2020-01-03 (×4): 300 ug/h via INTRAVENOUS
  Administered 2020-01-04: 21:00:00 200 ug/h via INTRAVENOUS
  Administered 2020-01-04: 300 ug/h via INTRAVENOUS
  Administered 2020-01-05: 08:00:00 200 ug/h via INTRAVENOUS
  Administered 2020-01-05 – 2020-01-06 (×2): 150 ug/h via INTRAVENOUS
  Administered 2020-01-07: 06:00:00 200 ug/h via INTRAVENOUS
  Administered 2020-01-07: 300 ug/h via INTRAVENOUS
  Administered 2020-01-08: 16:00:00 150 ug/h via INTRAVENOUS
  Administered 2020-01-08: 300 ug/h via INTRAVENOUS
  Administered 2020-01-09: 09:00:00 100 ug/h via INTRAVENOUS
  Administered 2020-01-10: 50 ug/h via INTRAVENOUS
  Administered 2020-01-11: 10:00:00 400 ug/h via INTRAVENOUS
  Administered 2020-01-11: 17:00:00 350 ug/h via INTRAVENOUS
  Administered 2020-01-11: 400 ug/h via INTRAVENOUS
  Administered 2020-01-12 – 2020-01-14 (×9): 325 ug/h via INTRAVENOUS
  Administered 2020-01-15: 300 ug/h via INTRAVENOUS
  Administered 2020-01-15: 05:00:00 200 ug/h via INTRAVENOUS
  Administered 2020-01-16: 300 ug/h via INTRAVENOUS
  Administered 2020-01-16: 22:00:00 200 ug/h via INTRAVENOUS
  Administered 2020-01-16: 09:00:00 300 ug/h via INTRAVENOUS
  Administered 2020-01-17: 13:00:00 100 ug/h via INTRAVENOUS
  Administered 2020-01-18: 10:00:00 150 ug/h via INTRAVENOUS
  Administered 2020-01-19 – 2020-01-21 (×3): 100 ug/h via INTRAVENOUS
  Filled 2020-01-02 (×37): qty 250

## 2020-01-02 MED ORDER — LEVOFLOXACIN IN D5W 500 MG/100ML IV SOLN
500.0000 mg | INTRAVENOUS | Status: DC
  Administered 2020-01-02: 500 mg via INTRAVENOUS
  Filled 2020-01-02 (×2): qty 100

## 2020-01-02 MED ORDER — BUDESONIDE 0.5 MG/2ML IN SUSP
0.5000 mg | Freq: Two times a day (BID) | RESPIRATORY_TRACT | Status: DC
  Administered 2020-01-02 – 2020-01-26 (×48): 0.5 mg via RESPIRATORY_TRACT
  Filled 2020-01-02 (×48): qty 2

## 2020-01-02 MED ORDER — VECURONIUM BROMIDE 10 MG IV SOLR
INTRAVENOUS | Status: DC | PRN
  Administered 2020-01-02: 20 mg via INTRAVENOUS

## 2020-01-02 MED ORDER — IPRATROPIUM-ALBUTEROL 0.5-2.5 (3) MG/3ML IN SOLN
3.0000 mL | RESPIRATORY_TRACT | Status: DC
  Administered 2020-01-02 (×2): 3 mL via RESPIRATORY_TRACT
  Filled 2020-01-02 (×2): qty 3

## 2020-01-02 MED ORDER — ORAL CARE MOUTH RINSE
15.0000 mL | OROMUCOSAL | Status: DC
  Administered 2020-01-02 – 2020-01-25 (×219): 15 mL via OROMUCOSAL

## 2020-01-02 MED ORDER — DOCUSATE SODIUM 50 MG/5ML PO LIQD
100.0000 mg | Freq: Two times a day (BID) | ORAL | Status: DC
  Administered 2020-01-02 – 2020-01-22 (×29): 100 mg
  Filled 2020-01-02 (×30): qty 10

## 2020-01-02 MED ORDER — LACTATED RINGERS IV BOLUS: 1000.0000 mL | Freq: Once | INTRAVENOUS | Status: DC

## 2020-01-02 MED ORDER — POLYETHYLENE GLYCOL 3350 17 G PO PACK
17.0000 g | PACK | Freq: Every day | ORAL | Status: DC
  Administered 2020-01-03 – 2020-01-20 (×17): 17 g
  Filled 2020-01-02 (×16): qty 1

## 2020-01-02 MED ORDER — MIDAZOLAM HCL 2 MG/2ML IJ SOLN
INTRAMUSCULAR | Status: AC
  Filled 2020-01-02: qty 2

## 2020-01-02 MED ORDER — VECURONIUM BROMIDE 10 MG IV SOLR
INTRAVENOUS | Status: AC
  Filled 2020-01-02: qty 10

## 2020-01-02 MED ORDER — FENTANYL CITRATE (PF) 100 MCG/2ML IJ SOLN: 50.0000 ug | Freq: Once | INTRAMUSCULAR | Status: DC

## 2020-01-02 MED ORDER — ENOXAPARIN SODIUM 60 MG/0.6ML ~~LOC~~ SOLN
60.0000 mg | SUBCUTANEOUS | Status: DC
  Administered 2020-01-03: 60 mg via SUBCUTANEOUS
  Filled 2020-01-02 (×4): qty 0.6

## 2020-01-02 MED ORDER — SODIUM CHLORIDE 0.9 % IV SOLN: 250.0000 mL | INTRAVENOUS | Status: DC

## 2020-01-02 MED ORDER — PROPOFOL 1000 MG/100ML IV EMUL
0.0000 ug/kg/min | INTRAVENOUS | Status: DC
  Administered 2020-01-02: 20 ug/kg/min via INTRAVENOUS
  Administered 2020-01-02: 5 ug/kg/min via INTRAVENOUS
  Administered 2020-01-02: 30 ug/kg/min via INTRAVENOUS
  Administered 2020-01-03: 24.617 ug/kg/min via INTRAVENOUS
  Administered 2020-01-03: 18.3 ug/kg/min via INTRAVENOUS
  Administered 2020-01-03: 24.617 ug/kg/min via INTRAVENOUS
  Administered 2020-01-03 – 2020-01-04 (×2): 20 ug/kg/min via INTRAVENOUS
  Administered 2020-01-04: 24.617 ug/kg/min via INTRAVENOUS
  Filled 2020-01-02 (×9): qty 100

## 2020-01-02 MED ORDER — MIDAZOLAM HCL 5 MG/5ML IJ SOLN
INTRAMUSCULAR | Status: DC | PRN
  Administered 2020-01-02: 4 mg via INTRAVENOUS

## 2020-01-02 NOTE — Progress Notes (Signed)
OT Cancellation Note  Patient Details Name: [name] MRN: [id number] DOB: 08-02-1970   Cancelled Treatment:    Reason Eval/Treat Not Completed: Patient not medically ready. Chart reviewed - pt noted to have K+ critically high at 5.7; contraindicated for exertional activity at this time. Will continue to follow and initiate services as pt medically appropriate to participate in therapy.   Dessie Coma, M.S. OTR/L  01/02/20, 8:18 AM  ascom [phone]

## 2020-01-02 NOTE — ED Notes (Signed)
RT performing inline suctioning with EDP at bedside. SPO2 now 60.

## 2020-01-02 NOTE — ED Notes (Signed)
SPO2 dropped to 48. EDP and RT at bedside. SPO2 now 54.

## 2020-01-02 NOTE — ED Notes (Signed)
Sharlet Salina FNP made aware of AM ABG results and slightly improved mentation, advising no new orders at this time, continue to monitor and continue to hold PO medications.

## 2020-01-02 NOTE — ED Notes (Signed)
Pt suctioned by RT.

## 2020-01-02 NOTE — ED Notes (Signed)
RT spoke with ICU provider who is on his way and has spoken to pt wife.

## 2020-01-02 NOTE — Progress Notes (Signed)
Updated the patients spouse via phone call.

## 2020-01-02 NOTE — ED Notes (Addendum)
Pt now more alert, speech is hard to understand but patient is opening eyes to verbal stimuli and following commands.

## 2020-01-02 NOTE — Progress Notes (Signed)
OT Cancellation Note  Patient Details Name: JARTAVIOUS MCKIMMY MRN: [id number] DOB: [birth year]   Cancelled Treatment:    Reason Eval/Treat Not Completed: Patient not medically ready. OT order received and chart reviewed. Per chart pt intubated, transitioned to ICU. Per theroay protocols will require new orders to initiate therapy services. Will follow acutely and initiate services as pt medically appropriate.   Dessie Coma, M.S. OTR/L  01/02/20, 3:36 PM  ascom [phone]

## 2020-01-02 NOTE — Progress Notes (Signed)
PT Cancellation Note  Patient Details Name: [name] MRN: [id number] DOB: 06/08/1970   Cancelled Treatment:    Reason Eval/Treat Not Completed: Other (comment). PT orders received, chart reviewed. Pt currently on bipap & K+ elevated (5.7). PT intervention not appropriate at this time. Will f/u with pt as more medically stable.  Lavone Nian, PT, DPT 01/02/20, 7:45 AM    [name] 01/02/2020, 7:43 AM

## 2020-01-02 NOTE — Progress Notes (Signed)
Patients BP dropped to 73/53 with a MAP of 61. Notified physician. Patient is sedated and on the vent.

## 2020-01-02 NOTE — ED Notes (Signed)
Dr Mortimer Fries informed of pt current BP.

## 2020-01-02 NOTE — ED Notes (Signed)
Pt SPO2 suddenly dropped to 75. Pt suctioned. Pt not biting tube. RT called. EDP called.

## 2020-01-02 NOTE — Consult Note (Signed)
Name: CEBASTIAN NEIS MRN: [id number] DOB: Feb 04, 1971     CONSULTATION DATE: 01/01/2020  REFERRING MD :  Gustavo Lah  CHIEF COMPLAINT:  resp distress   HISTORY OF PRESENT ILLNESS:    49 y.o. male with medical history significant of chronic combined systolic and diastolic CHF, COPD, CAD, history of MI, type 2 diabetes, hyperlipidemia, hypertension, sarcoidosis, sleep apnea not using CPAP regularly,  history of other nonhemorrhagic CVA who is brought to the emergency department with complaints of chest tightness, dyspnea associated with clear sputum productive cough that started about 2 to 3 weeks ago and has become progressively worse.    No fever, chills, nausea or vomiting. He has been very somnolent at home.  No diarrhea, constipation or urinary symptoms.  He is currently on BiPAP ventilation and unable to provide much history.  PREVIOUS history of Trach and LTACH ADMISSION Recurrent bouts of hops admission for resp failure   ED Course:  initial vital signs were temperature 98.4 F, pulse 87, respiration 19, blood pressure 137/81 mmHg O2 sat 100% on room air.  supplemental oxygen, bronchodilators, Solu-Medrol and was placed on BiPAP ventilation mode after his initial ABG came back with a pH of 7.17 and PCO2 of more than 120.0 mmHg.  Repeat ABG showed mildly improved pH at 7.20 and PCO2 of 119 mmHg.    Patient with progressive SOB and increased WOB Patient was emergently intubated and placed on vent   PAST MEDICAL HISTORY :   has a past medical history of CHF (congestive heart failure) ([place]), COPD (chronic obstructive pulmonary disease) ([place]), Coronary artery disease, Diabetes mellitus without complication ([place]), Hypercholesteremia ([place]), Hypertension, Myocardial infarction [facility]), Sarcoidosis, Sleep apnea, and Stroke ([place]).  has a past surgical history that includes Appendectomy and Partial nephrectomy. Prior to Admission medications   Medication Sig Start Date End Date Taking?  Authorizing Provider  albuterol (VENTOLIN HFA) 108 (90 Base) MCG/ACT inhaler Inhale 1-2 puffs into the lungs every 4 (four) hours as needed for wheezing or shortness of breath.   Yes [provider]  apixaban (ELIQUIS) 5 MG TABS tablet Take 5 mg by mouth 2 (two) times daily. 06/11/16  Yes [provider]  aspirin 81 MG EC tablet Take 81 mg by mouth at bedtime.    Yes [provider]  atorvastatin (LIPITOR) 40 MG tablet Take 40 mg by mouth at bedtime.    Yes [provider]  Fluticasone-Salmeterol (ADVAIR) 250-50 MCG/DOSE AEPB Inhale 1 puff into the lungs 2 (two) times daily.   Yes [provider]  furosemide (LASIX) 80 MG tablet Take 80 mg by mouth 2 (two) times daily.   Yes [provider]  insulin aspart (NOVOLOG) 100 UNIT/ML injection Inject 5-15 Units into the skin 3 (three) times daily before meals.    Yes [provider]  insulin detemir (LEVEMIR) 100 UNIT/ML injection Inject 40-50 Units into the skin at bedtime.    Yes [provider]  ipratropium-albuterol (DUONEB) 0.5-2.5 (3) MG/3ML SOLN Inhale 3 mLs into the lungs 4 (four) times daily as needed for wheezing. 09/11/18  Yes [provider]  isosorbide dinitrate (ISORDIL) 10 MG tablet Take 1 tablet (10 mg total) by mouth 3 (three) times daily. 12/29/19  Yes Hackney, Tina A, FNP  lisinopril (ZESTRIL) 30 MG tablet Take 30 mg by mouth at bedtime.    Yes [provider]  Melatonin 5 MG TABS Take 5 mg by mouth at bedtime as needed for sleep.   Yes [provider]  metoprolol tartrate (LOPRESSOR) 100 MG tablet Take 1 tablet (100 mg total) by mouth 2 (two) times daily. Patient taking differently: Take 100 mg by mouth at bedtime.  07/30/18  Yes Fritzi Mandes, MD  potassium chloride SA (KLOR-CON) 20 MEQ tablet Take 20 mEq by mouth at bedtime.    Yes [provider]  theophylline (THEODUR) 300 MG 12 hr tablet Take 300 mg by mouth 2 (two) times daily.     Yes [provider]  tiotropium (SPIRIVA) 18 MCG inhalation capsule Place 18 mcg into inhaler and inhale daily.    [provider]   Allergies  Allergen Reactions  . Penicillins Anaphylaxis and Other (See Comments)    Has patient had a PCN reaction causing immediate rash, facial/tongue/throat swelling, SOB or lightheadedness with hypotension: Yes Has patient had a PCN reaction causing severe rash involving mucus membranes or skin necrosis: No Has patient had a PCN reaction that required hospitalization: No Has patient had a PCN reaction occurring within the last 10 years: No If all of the above answers are "NO", then may proceed with Cephalosporin use.     FAMILY HISTORY:  family history includes Cancer in his father; Diabetes in his mother; Heart disease in his mother; Hypertension in his mother. SOCIAL HISTORY:  reports that he has been smoking cigarettes. He has a 6.25 pack-year smoking history. He has never used smokeless tobacco. He reports previous alcohol use. He reports current drug use. Drugs: Marijuana and PCP.  REVIEW OF SYSTEMS:   Unable to obtain due to critical illness      Estimated body mass index is 36.05 kg/m as calculated from the following:   Height as of this encounter: 6\' 1"  (1.854 m).   Weight as of this encounter: 123.9 kg.    VITAL SIGNS: Pulse Rate:  [69-98] 97 (11/27 1313) Resp:  [12-51] 24 (11/27 1313) BP: (93-151)/(54-99) 105/75 (11/27 1313) SpO2:  [79 %-100 %] 100 % (11/27 1313) FiO2 (%):  [30 %-35 %] 30 % (11/26 1723)   No intake/output data recorded. No intake/output data recorded.   SpO2: 100 % O2 Flow Rate (L/min): 3 L/min FiO2 (%): (S) 30 % (Weaned per spo2)   Physical Examination:  GENERAL:critically ill appearing, +resp distress HEAD: Normocephalic, atraumatic.  EYES: Pupils equal, round, reactive to light.  No scleral icterus.  MOUTH: Moist mucosal membrane. NECK: Supple. No JVD.  PULMONARY: +rhonchi,  +wheezing CARDIOVASCULAR: S1 and S2. Regular rate and rhythm. No murmurs, rubs, or gallops.  GASTROINTESTINAL: Soft, nontender, -distended.  Positive bowel sounds.  MUSCULOSKELETAL: +edema.  NEUROLOGIC: obtunded SKIN:intact,warm,dry   MEDICATIONS: I have reviewed all medications and confirmed regimen as documented   CULTURE RESULTS   Recent Results (from the past 240 hour(s))  Resp Panel by RT-PCR (Flu A&B, Covid) Nasopharyngeal Swab     Status: None   Collection Time: 01/01/20  3:13 PM   Specimen: Nasopharyngeal Swab; Nasopharyngeal(NP) swabs in vial transport medium  Result Value Ref Range Status   SARS Coronavirus 2 by RT PCR NEGATIVE NEGATIVE Final    Comment: (NOTE) SARS-CoV-2 target nucleic acids are NOT DETECTED.  The SARS-CoV-2 RNA is generally detectable in upper respiratory specimens during the acute phase of infection. The lowest concentration of SARS-CoV-2 viral copies this assay can detect is 138 copies/mL. A negative result does not preclude SARS-Cov-2 infection and should not be used as the sole basis for treatment or other patient management decisions. A negative result may occur with  improper specimen collection/handling, submission of  specimen other than nasopharyngeal swab, presence of viral mutation(s) within the areas targeted by this assay, and inadequate number of viral copies(<138 copies/mL). A negative result must be combined with clinical observations, patient history, and epidemiological information. The expected result is Negative.  Fact Sheet for Patients:  EntrepreneurPulse.com.au  Fact Sheet for Healthcare Providers:  IncredibleEmployment.be  This test is no t yet approved or cleared by the [place] FDA and  has been authorized for detection and/or diagnosis of SARS-CoV-2 by FDA under an Emergency Use Authorization (EUA). This EUA will remain  in effect (meaning this test can be used) for the duration  of the COVID-19 declaration under Section 564(b)(1) of the Act, 21 U.S.C.section 360bbb-3(b)(1), unless the authorization is terminated  or revoked sooner.       Influenza A by PCR NEGATIVE NEGATIVE Final   Influenza B by PCR NEGATIVE NEGATIVE Final    Comment: (NOTE) The Xpert Xpress SARS-CoV-2/FLU/RSV plus assay is intended as an aid in the diagnosis of influenza from Nasopharyngeal swab specimens and should not be used as a sole basis for treatment. Nasal washings and aspirates are unacceptable for Xpert Xpress SARS-CoV-2/FLU/RSV testing.  Fact Sheet for Patients: EntrepreneurPulse.com.au  Fact Sheet for Healthcare Providers: IncredibleEmployment.be  This test is not yet approved or cleared by the [place] FDA and has been authorized for detection and/or diagnosis of SARS-CoV-2 by FDA under an Emergency Use Authorization (EUA). This EUA will remain in effect (meaning this test can be used) for the duration of the COVID-19 declaration under Section 564(b)(1) of the Act, 21 U.S.C. section 360bbb-3(b)(1), unless the authorization is terminated or revoked.  Performed at [facility], [address] [place], [place] [address]             Indwelling Urinary Catheter continued, requirement due to   Reason to continue Indwelling Urinary Catheter strict Intake/Output monitoring for hemodynamic instability         Ventilator continued, requirement due to severe respiratory failure   Ventilator Sedation RASS 0 to -2      ASSESSMENT AND PLAN SYNOPSIS  49 yo AAM with severe morbid obesity with severe biventricular heart failure with acute and severe resp failue from acute COPD exacerbation with severe metabolic encephalopathy from hypoxia and hypercapnia  49 yo morbvi  Severe ACUTE Hypoxic and Hypercapnic Respiratory Failure -continue Full MV support -continue Bronchodilator Therapy -Wean Fio2 and PEEP as  tolerated -VAP/VENT bundle implementation  ACUTE SYSTOLIC CARDIAC FAILURE- EF -oxygen as needed -Lasix as tolerated  SEVERE COPD EXACERBATION -continue IV steroids as prescribed -continue NEB THERAPY as prescribed -morphine as needed -wean fio2 as needed and tolerated   Morbid obesity, + OSA.   Will certainly impact respiratory mechanics, ventilator weaning Suspect will need to consider additional PEEP Patient will likely need trach   ACUTE KIDNEY INJURY/Renal Failure -continue Foley Catheter-assess need -Avoid nephrotoxic agents -Follow urine output, BMP -Ensure adequate renal perfusion, optimize oxygenation -Renal dose medications     NEUROLOGY Acute toxic metabolic encephalopathy, need for sedation Goal RASS -2 to -3 Need to consider MRI brain, has h/o CVA   CARDIAC ICU monitoring  ID -continue IV abx as prescibed -follow up cultures  GI GI PROPHYLAXIS as indicated  NUTRITIONAL STATUS DIET-->TF's as tolerated Constipation protocol as indicated   ENDO - will use ICU hypoglycemic\Hyperglycemia protocol if needed    ELECTROLYTES -follow labs as needed -replace as needed -pharmacy consultation and following    DVT/GI PRX ordered and assessed TRANSFUSIONS AS NEEDED  MONITOR FSBS I Assessed the need for Labs I Assessed the need for Foley I Assessed the need for Central Venous Line Family Discussion when available I Assessed the need for Mobilization I made an Assessment of medications to be adjusted accordingly Safety Risk assessment Completed  CASE DISCUSSED IN MULTIDISCIPLINARY ROUNDS WITH ICU TEAM   Critical Care Time devoted to patient care services described in this note is 78 minutes.   Overall, patient is critically ill, prognosis is guarded.  Patient with Multiorgan failure and at high risk for cardiac arrest and death.    Corrin Parker, M.D.  [facility]  Medical Director [place] Director [facility] Cardio-Pulmonary Department

## 2020-01-02 NOTE — ED Notes (Addendum)
ICU provider called. He states they are going to intubate pt in ED and has called RT who is now at bedside. ICU provider asked for [place] to prepare 241mcg fentanyl, 4mg  Versed and 20mg  of Veccuronium and to call him back at [phone] once all is ready.

## 2020-01-02 NOTE — ED Notes (Signed)
Called ICU provider to inform all meds and equipment are ready for intubation. He stated will come once has talked to wife.

## 2020-01-02 NOTE — ED Notes (Signed)
RT at bedside.

## 2020-01-02 NOTE — ED Notes (Signed)
Pt wife called. Explained that pt is going to be intubated per ICU provider. Wife stated will call back after about 1 hour.

## 2020-01-02 NOTE — ED Notes (Signed)
Called Dr Mortimer Fries back. Informed that intubation meds are prepared. Informed that wife called. He stated he had tried to call her but will try to call back.

## 2020-01-02 NOTE — ED Notes (Signed)
Called Rt to let them know that attending MD has asked for repeat ABG and has put in order.

## 2020-01-02 NOTE — Progress Notes (Signed)
GOALS OF CARE DISCUSSION  The Clinical status was relayed to family in [place] over the phone  Updated and notified of patients medical condition.  Patient is having a weak cough and struggling to remove secretions.   patient with increased WOB and using accessory muscles to breathe Explained to family course of therapy and the modalities     Patient with Progressive multiorgan failure with very low chance of meaningful recovery despite all aggressive and optimal medical therapy.  Wife understands the situation.  Family are satisfied with Plan of action and management. All questions answered  Additional CC time 35 mins   [name] Patricia Pesa, M.D.  [facility]  Medical Director [place] Director [facility] Cardio-Pulmonary Department

## 2020-01-02 NOTE — ED Notes (Signed)
SPO2 now 94. RT and EDP still at bedside.

## 2020-01-02 NOTE — ED Notes (Signed)
Attending notified of abnormal ABG results.

## 2020-01-02 NOTE — ED Notes (Signed)
X-ray at bedside

## 2020-01-02 NOTE — ED Notes (Signed)
Attending at bedside.

## 2020-01-02 NOTE — ED Notes (Signed)
Propofol infusion paused for low BP.

## 2020-01-02 NOTE — ED Notes (Signed)
Called ICU to give report. ICU nurse is in isolation room and will call back on ascom phone.

## 2020-01-02 NOTE — Progress Notes (Addendum)
Patient was seen at at bedside.  Patient has been on BiPAP overnight with somnolence and nonimprovement of ABG results.  On my examination patient was somnolent but answering appropriately.  Obese patient.  Patient does have history of intubation in the past and states that she was on CPAP for sleep apnea.    I immediately requested for ABG which showed pH of 7.2 with PCO2 of 116 on 20/10 BiPAP.   Previous ABG at 5 AM was pH of 7.21 with PCO2 of 111.  Worsening ABG report and clinical information immediately reported to the ICU attending Dr. Mortimer Fries. Patient will benefit from intubation and transfer to ICU.  ICU will be taking over care from here.  Will sign off.

## 2020-01-03 ENCOUNTER — Encounter: Payer: Self-pay | Admitting: Internal Medicine

## 2020-01-03 ENCOUNTER — Inpatient Hospital Stay: Payer: Medicare HMO

## 2020-01-03 DIAGNOSIS — J9601 Acute respiratory failure with hypoxia: Secondary | ICD-10-CM | POA: Diagnosis not present

## 2020-01-03 DIAGNOSIS — J9602 Acute respiratory failure with hypercapnia: Secondary | ICD-10-CM | POA: Diagnosis not present

## 2020-01-03 DIAGNOSIS — J9620 Acute and chronic respiratory failure, unspecified whether with hypoxia or hypercapnia: Secondary | ICD-10-CM | POA: Diagnosis not present

## 2020-01-03 DIAGNOSIS — I5042 Chronic combined systolic (congestive) and diastolic (congestive) heart failure: Secondary | ICD-10-CM | POA: Diagnosis not present

## 2020-01-03 LAB — BLOOD GAS, ARTERIAL
Acid-Base Excess: 14.4 mmol/L — ABNORMAL HIGH (ref 0.0–2.0)
Acid-Base Excess: 13.9 mmol/L — ABNORMAL HIGH (ref 0.0–2.0)
Bicarbonate: 40.8 mmol/L — ABNORMAL HIGH (ref 20.0–28.0)
Bicarbonate: 43.7 mmol/L — ABNORMAL HIGH (ref 20.0–28.0)
FIO2: 0.4
FIO2: 40
MECHVT: 500 mL
MECHVT: 500 mL
Mechanical Rate: 24
O2 Saturation: 98.1 %
O2 Saturation: 96.5 %
PEEP: 8 cmH2O
PEEP: 8 cmH2O
Patient temperature: 37
Patient temperature: 37
RATE: 18 resp/min
pCO2 arterial: 56 mmHg — ABNORMAL HIGH (ref 32.0–48.0)
pCO2 arterial: 81 mmHg (ref 32.0–48.0)
pH, Arterial: 7.47 — ABNORMAL HIGH (ref 7.350–7.450)
pH, Arterial: 7.34 — ABNORMAL LOW (ref 7.350–7.450)
pO2, Arterial: 99 mmHg (ref 83.0–108.0)
pO2, Arterial: 91 mmHg (ref 83.0–108.0)

## 2020-01-03 LAB — BASIC METABOLIC PANEL
Anion gap: 11 (ref 5–15)
BUN: 47 mg/dL — ABNORMAL HIGH (ref 6–20)
CO2: 36 mmol/L — ABNORMAL HIGH (ref 22–32)
Calcium: 8.5 mg/dL — ABNORMAL LOW (ref 8.9–10.3)
Chloride: 98 mmol/L (ref 98–111)
Creatinine, Ser: 2.59 mg/dL — ABNORMAL HIGH (ref 0.61–1.24)
GFR, Estimated: 29 mL/min — ABNORMAL LOW (ref >60)
Glucose, Bld: 161 mg/dL — ABNORMAL HIGH (ref 70–99)
Potassium: 4.3 mmol/L (ref 3.5–5.1)
Sodium: 145 mmol/L (ref 135–145)

## 2020-01-03 LAB — URINE DRUG SCREEN, QUALITATIVE (ARMC ONLY)
Amphetamines, Ur Screen: NOT DETECTED
Barbiturates, Ur Screen: NOT DETECTED
Benzodiazepine, Ur Scrn: POSITIVE — AB
Cannabinoid 50 Ng, Ur ~~LOC~~: NOT DETECTED
Cocaine Metabolite,Ur ~~LOC~~: NOT DETECTED
MDMA (Ecstasy)Ur Screen: NOT DETECTED
Methadone Scn, Ur: NOT DETECTED
Opiate, Ur Screen: NOT DETECTED
Phencyclidine (PCP) Ur S: NOT DETECTED

## 2020-01-03 LAB — HEPATITIS C ANTIBODY: HCV Ab: NONREACTIVE

## 2020-01-03 LAB — GLUCOSE, CAPILLARY
Glucose-Capillary: 146 mg/dL — ABNORMAL HIGH (ref 70–99)
Glucose-Capillary: 159 mg/dL — ABNORMAL HIGH (ref 70–99)
Glucose-Capillary: 126 mg/dL — ABNORMAL HIGH (ref 70–99)
Glucose-Capillary: 138 mg/dL — ABNORMAL HIGH (ref 70–99)

## 2020-01-03 LAB — HEPATITIS B CORE ANTIBODY, IGM: Hep B C IgM: NONREACTIVE

## 2020-01-03 LAB — TRIGLYCERIDES: Triglycerides: 146 mg/dL (ref <150)

## 2020-01-03 LAB — MAGNESIUM: Magnesium: 2.2 mg/dL (ref 1.7–2.4)

## 2020-01-03 LAB — HEPATITIS B SURFACE ANTIBODY,QUALITATIVE: Hep B S Ab: NONREACTIVE

## 2020-01-03 LAB — HEPATITIS B SURFACE ANTIGEN: Hepatitis B Surface Ag: NONREACTIVE

## 2020-01-03 LAB — PHOSPHORUS: Phosphorus: 1.4 mg/dL — ABNORMAL LOW (ref 2.5–4.6)

## 2020-01-03 MED ORDER — IPRATROPIUM-ALBUTEROL 0.5-2.5 (3) MG/3ML IN SOLN
3.0000 mL | Freq: Two times a day (BID) | RESPIRATORY_TRACT | Status: DC
  Administered 2020-01-03 – 2020-01-05 (×5): 3 mL via RESPIRATORY_TRACT
  Filled 2020-01-03 (×5): qty 3

## 2020-01-03 MED ORDER — POTASSIUM & SODIUM PHOSPHATES 280-160-250 MG PO PACK
1.0000 | PACK | Freq: Three times a day (TID) | ORAL | Status: AC
  Administered 2020-01-03 (×3): 1
  Filled 2020-01-03 (×3): qty 1

## 2020-01-03 MED ORDER — POTASSIUM & SODIUM PHOSPHATES 280-160-250 MG PO PACK
1.0000 | PACK | Freq: Three times a day (TID) | ORAL | Status: DC
  Filled 2020-01-03: qty 1

## 2020-01-03 MED ORDER — SODIUM CHLORIDE 0.45 % IV SOLN: INTRAVENOUS | Status: DC

## 2020-01-03 MED ORDER — LEVOFLOXACIN IN D5W 750 MG/150ML IV SOLN
750.0000 mg | INTRAVENOUS | Status: DC
  Administered 2020-01-04: 750 mg via INTRAVENOUS
  Filled 2020-01-03: qty 150

## 2020-01-03 NOTE — Progress Notes (Signed)
PHARMACY NOTE:  ANTIMICROBIAL RENAL DOSAGE ADJUSTMENT  Current antimicrobial regimen includes a mismatch between antimicrobial dosage and estimated renal function.  As per policy approved by the Pharmacy & Therapeutics and Medical Executive Committees, the antimicrobial dosage will be adjusted accordingly.  Current antimicrobial dosage:  Levofloxacin 500 q24h  Indication: CAP  Renal Function:   Estimated Creatinine Clearance: 47.2 mL/min (A) (by C-G formula based on SCr of 2.59 mg/dL (H)).    Antimicrobial dosage has been changed to:  Levofloxacin 750 q48h  Additional comments:  Scr 1.55>2.59 (baseline ~1.4)   Thank you for allowing pharmacy to be a part of this patient's care.  Lorna Dibble, [facility] 01/03/2020 8:29 AM

## 2020-01-03 NOTE — Progress Notes (Signed)
Set respiratory rate decreased from 24 to 18 post A.M abg results. A.M. abg results were as follows:  PH:            7.47 PCO2:       56 PO2:          99 BE:             14.4 HCO3:        40.8 SPO2:        98%  ACP and RN aware of changes.

## 2020-01-03 NOTE — Consult Note (Signed)
[facility]  CONSULT NOTE    Date: 01/03/2020                  Patient Name:  [name]  MRN: [id number]  DOB: 1970/05/31  Age / Sex: 49 y.o., male         PCP: [place]                 Service Requesting Consult: Dr. Mortimer Fries                 Reason for Consult: Acute kidney injury            History of Present Illness: [name] presented to the [facility] ED with progressive shortness of breath and productive cough for last few weeks. Patient went into acute respiratory failure requiring intubation and mechanical ventilation. Patient was started on norepinephrine due to hypotension. Nephrology consulted for acute renal failure. Urine output since admission was 580. No family at bedside.    Medications: Outpatient medications: Medications Prior to Admission  Medication Sig Dispense Refill Last Dose  . albuterol (VENTOLIN HFA) 108 (90 Base) MCG/ACT inhaler Inhale 1-2 puffs into the lungs every 4 (four) hours as needed for wheezing or shortness of breath.   Unknown at PRN  . apixaban (ELIQUIS) 5 MG TABS tablet Take 5 mg by mouth 2 (two) times daily.   12-24 hours at Unknown  . aspirin 81 MG EC tablet Take 81 mg by mouth at bedtime.      Marland Kitchen atorvastatin (LIPITOR) 40 MG tablet Take 40 mg by mouth at bedtime.      . Fluticasone-Salmeterol (ADVAIR) 250-50 MCG/DOSE AEPB Inhale 1 puff into the lungs 2 (two) times daily.   12-24 hours at Unknown  . furosemide (LASIX) 80 MG tablet Take 80 mg by mouth 2 (two) times daily.   12-24 hours at Unknown  . insulin aspart (NOVOLOG) 100 UNIT/ML injection Inject 5-15 Units into the skin 3 (three) times daily before meals.      . insulin detemir (LEVEMIR) 100 UNIT/ML injection Inject 40-50 Units into the skin at bedtime.      Marland Kitchen ipratropium-albuterol (DUONEB) 0.5-2.5 (3) MG/3ML SOLN Inhale 3 mLs into the lungs 4 (four) times daily as needed for wheezing.   Unknown at PRN  . isosorbide dinitrate  (ISORDIL) 10 MG tablet Take 1 tablet (10 mg total) by mouth 3 (three) times daily. 270 tablet 3 12-24 hours at Unknown  . lisinopril (ZESTRIL) 30 MG tablet Take 30 mg by mouth at bedtime.    12-24 hours at Unknown  . Melatonin 5 MG TABS Take 5 mg by mouth at bedtime as needed for sleep.   Unknown at PRN  . metoprolol tartrate (LOPRESSOR) 100 MG tablet Take 1 tablet (100 mg total) by mouth 2 (two) times daily. (Patient taking differently: Take 100 mg by mouth at bedtime. ) 60 tablet 2   . potassium chloride SA (KLOR-CON) 20 MEQ tablet Take 20 mEq by mouth at bedtime.      . theophylline (THEODUR) 300 MG 12 hr tablet Take 300 mg by mouth 2 (two) times daily.    12-24 hours at Unknown  . tiotropium (SPIRIVA) 18 MCG inhalation capsule Place 18 mcg into inhaler and inhale daily.   Unknown at Unknown    Current medications: Current Facility-Administered Medications  Medication Dose Route Frequency Provider Last Rate Last Admin  . 0.45 % sodium chloride infusion   Intravenous  Continuous Kolluru, Sarath, MD      . 0.9 %  sodium chloride infusion  250 mL Intravenous Continuous Flora Lipps, MD      . acetaminophen (TYLENOL) tablet 650 mg  650 mg Oral Q6H PRN Reubin Milan, MD       Or  . acetaminophen (TYLENOL) suppository 650 mg  650 mg Rectal Q6H PRN Reubin Milan, MD      . budesonide (PULMICORT) nebulizer solution 0.5 mg  0.5 mg Nebulization BID Flora Lipps, MD   0.5 mg at 01/03/20 0734  . chlorhexidine gluconate (MEDLINE KIT) (PERIDEX) 0.12 % solution 15 mL  15 mL Mouth Rinse BID Flora Lipps, MD   15 mL at 01/03/20 0859  . Chlorhexidine Gluconate Cloth 2 % PADS 6 each  6 each Topical [id number] Flora Lipps, MD   6 each at 01/03/20 0658  . docusate (COLACE) 50 MG/5ML liquid 100 mg  100 mg Per Tube BID Flora Lipps, MD   100 mg at 01/03/20 0900  . enoxaparin (LOVENOX) injection 60 mg  60 mg Subcutaneous Q24H Flora Lipps, MD      . fentaNYL (SUBLIMAZE) bolus via infusion 50 mcg  50 mcg  Intravenous Q15 min PRN Flora Lipps, MD      . fentaNYL (SUBLIMAZE) injection 50 mcg  50 mcg Intravenous Once Flora Lipps, MD      . fentaNYL (SUBLIMAZE) injection   Intravenous PRN Flora Lipps, MD   200 mcg at 01/02/20 1307  . fentaNYL 2562mg in NS 2543m(1029mml) infusion-PREMIX  0-400 mcg/hr Intravenous Continuous KasFlora LippsD 30 mL/hr at 01/03/20 0626 300 mcg/hr at 01/03/20 0626  . insulin aspart (novoLOG) injection 0-20 Units  0-20 Units Subcutaneous TID WC OrtReubin MilanD   3 Units at 01/03/20 [phone] ipratropium-albuterol (DUONEB) 0.5-2.5 (3) MG/3ML nebulizer solution 3 mL  3 mL Nebulization BID Rust-Chester, Britton L, NP   3 mL at 01/03/20 0734  . lactated ringers bolus 1,000 mL  1,000 mL Intravenous Once KasFlora LippsD      . [STDerrill Memo 01/04/2020] levofloxacin (LEVAQUIN) IVPB 750 mg  750 mg Intravenous Q48H Beers, BraShanon BrowPH      . MEDLINE mouth rinse  15 mL Mouth Rinse 10 times per day KasFlora LippsD   15 mL at 01/03/20 0900  . methylPREDNISolone sodium succinate (SOLU-MEDROL) 40 mg/mL injection 40 mg  40 mg Intravenous Q12H KasFlora LippsD   40 mg at 01/03/20 0521  . midazolam (VERSED) injection 2 mg  2 mg Intravenous Q2H PRN KasFlora LippsD   2 mg at 01/02/20 1441  . norepinephrine (LEVOPHED) 4mg59m 250mL62mmix infusion  0-10 mcg/min Intravenous Titrated Rust-Chester, Britton L, NP 18.75 mL/hr at 01/03/20 0500 5 mcg/min at 01/03/20 0500  . ondansetron (ZOFRAN) tablet 4 mg  4 mg Oral Q6H PRN OrtizReubin Milan      Or  . ondansetron (ZOFREncompass Health Rehabilitation Hospital Of Dallasection 4 mg  4 mg Intravenous Q6H PRN OrtizReubin Milan     . polyethylene glycol (MIRALAX / GLYCOFloria Ravelingket 17 g  17 g Per Tube Daily Kasa,Flora Lipps  17 g at 01/03/20 0901  . potassium & sodium phosphates (PHOS-NAK) 280-160-250 MG packet 1 packet  1 packet Per Tube TID AC & HS BeersLorna Dibble   1 packet at 01/03/20 0902  . propofol (DIPRIVAN) 1000 MG/100ML infusion  0-50 mcg/kg/min Intravenous  Continuous Kasa,Flora Lipps14.87 mL/hr at 01/03/20  0523 20 mcg/kg/min at 01/03/20 0523  . vecuronium (NORCURON) injection   Intravenous PRN Flora Lipps, MD   20 mg at 01/02/20 1309      Allergies: Allergies  Allergen Reactions  . Penicillins Anaphylaxis and Other (See Comments)    Has patient had a PCN reaction causing immediate rash, facial/tongue/throat swelling, SOB or lightheadedness with hypotension: Yes Has patient had a PCN reaction causing severe rash involving mucus membranes or skin necrosis: No Has patient had a PCN reaction that required hospitalization: No Has patient had a PCN reaction occurring within the last 10 years: No If all of the above answers are "NO", then may proceed with Cephalosporin use.       Past Medical History: Past Medical History:  Diagnosis Date  . CHF (congestive heart failure) (HCC)    EF 45-50%- 2020  . CKD (chronic kidney disease) stage 3, GFR 30-59 ml/min (HCC)   . Clear cell renal cell carcinoma (HCC)    s/p partial nephrectomy  . COPD (chronic obstructive pulmonary disease) ([place])   . Coronary artery disease   . Diabetes mellitus without complication ([place])   . Hypercholesteremia unk  . Hypertension   . Myocardial infarction ([place])   . Sarcoidosis   . Sleep apnea    does not use CPAP regularly.  . Stroke [facility])      Past Surgical History: Past Surgical History:  Procedure Laterality Date  . APPENDECTOMY    . PARTIAL NEPHRECTOMY       Family History: Family History  Problem Relation Age of Onset  . Hypertension Mother   . Heart disease Mother   . Diabetes Mother   . Cancer Father      Social History: Social History   Socioeconomic History  . Marital status: Married    Spouse name: Not on file  . Number of children: Not on file  . Years of education: Not on file  . Highest education level: Not on file  Occupational History  . Occupation: disabled  Tobacco Use  . Smoking status: Current Every Day Smoker     Packs/day: 0.25    Years: 25.00    Pack years: 6.25    Types: Cigarettes    Last attempt to quit: 02/05/2015    Years since quitting: 4.9  . Smokeless tobacco: Never Used  Vaping Use  . Vaping Use: Never used  Substance and Sexual Activity  . Alcohol use: Not Currently    Alcohol/week: 0.0 standard drinks    Comment: occassional  . Drug use: Yes    Types: Marijuana, PCP    Comment: 4 weeks  . Sexual activity: Yes  Other Topics Concern  . Not on file  Social History Narrative  . Not on file   Social Determinants of Health   Financial Resource Strain:   . Difficulty of Paying Living Expenses: Not on file  Food Insecurity:   . Worried About Charity fundraiser in the Last Year: Not on file  . Ran Out of Food in the Last Year: Not on file  Transportation Needs:   . Lack of Transportation (Medical): Not on file  . Lack of Transportation (Non-Medical): Not on file  Physical Activity:   . Days of Exercise per Week: Not on file  . Minutes of Exercise per Session: Not on file  Stress:   . Feeling of Stress : Not on file  Social Connections:   . Frequency of Communication with Friends and Family: Not on file  .  Frequency of Social Gatherings with Friends and Family: Not on file  . Attends Religious Services: Not on file  . Active Member of Clubs or Organizations: Not on file  . Attends Archivist Meetings: Not on file  . Marital Status: Not on file  Intimate Partner Violence:   . Fear of Current or Ex-Partner: Not on file  . Emotionally Abused: Not on file  . Physically Abused: Not on file  . Sexually Abused: Not on file     Review of Systems: Review of Systems  Unable to perform ROS: Critical illness    Vital Signs: Blood pressure (!) 90/55, pulse 68, temperature 97.9 F (36.6 C), resp. rate 18, height 6' 1" (1.854 m), weight 121.9 kg, SpO2 98 %.  Weight trends: Filed Weights   01/01/20 1114 01/02/20 1439  Weight: 123.9 kg 121.9 kg    Physical  Exam: General: Critically ill  Head: ETT, OGT  Eyes: Anicteric, PERRL  Neck: trachea midline  Lungs:  Clear bilaterally, PRVC FiO2 40%  Heart: Regular rate and rhythm  Abdomen:  Soft, nontender  Extremities:  no peripheral edema.  Neurologic: Intubated and sedated  Skin: No lesions  Access: none     Lab results: Basic Metabolic Panel: Recent Labs  Lab 01/01/20 1117 01/02/20 0421 01/03/20 0455  NA 143 146* 145  K 4.3 5.7* 4.3  CL 94* 99 98  CO2 40* 40* 36*  GLUCOSE 239* 173* 161*  BUN 20 23* 47*  CREATININE 1.61* 1.55* 2.59*  CALCIUM 9.1 8.5* 8.5*  MG  --   --  2.2  PHOS  --   --  1.4*    Liver Function Tests: Recent Labs  Lab 01/02/20 0421  AST 17  ALT 21  ALKPHOS 72  BILITOT 0.5  PROT 7.2  ALBUMIN 3.7   No results for input(s): LIPASE, AMYLASE in the last 168 hours. No results for input(s): AMMONIA in the last 168 hours.  CBC: Recent Labs  Lab 01/01/20 1117  WBC 8.6  HGB 15.4  HCT 50.3  MCV 94.0  PLT 208    Cardiac Enzymes: No results for input(s): CKTOTAL, CKMB, CKMBINDEX, TROPONINI in the last 168 hours.  BNP: Invalid input(s): POCBNP  CBG: Recent Labs  Lab 01/02/20 0759 01/02/20 1306 01/02/20 1441 01/02/20 1610 01/03/20 0805  GLUCAP 172* 111* 138* 133* 146*    Microbiology: Results for orders placed or performed during the hospital encounter of 01/01/20  Resp Panel by RT-PCR (Flu A&B, Covid) Nasopharyngeal Swab     Status: None   Collection Time: 01/01/20  3:13 PM   Specimen: Nasopharyngeal Swab; Nasopharyngeal(NP) swabs in vial transport medium  Result Value Ref Range Status   SARS Coronavirus 2 by RT PCR NEGATIVE NEGATIVE Final    Comment: (NOTE) SARS-CoV-2 target nucleic acids are NOT DETECTED.  The SARS-CoV-2 RNA is generally detectable in upper respiratory specimens during the acute phase of infection. The lowest concentration of SARS-CoV-2 viral copies this assay can detect is 138 copies/mL. A negative result does not  preclude SARS-Cov-2 infection and should not be used as the sole basis for treatment or other patient management decisions. A negative result may occur with  improper specimen collection/handling, submission of specimen other than nasopharyngeal swab, presence of viral mutation(s) within the areas targeted by this assay, and inadequate number of viral copies(<138 copies/mL). A negative result must be combined with clinical observations, patient history, and epidemiological information. The expected result is Negative.  Fact Sheet for Patients:  EntrepreneurPulse.com.au  Fact Sheet for Healthcare Providers:  IncredibleEmployment.be  This test is no t yet approved or cleared by the [place] FDA and  has been authorized for detection and/or diagnosis of SARS-CoV-2 by FDA under an Emergency Use Authorization (EUA). This EUA will remain  in effect (meaning this test can be used) for the duration of the COVID-19 declaration under Section 564(b)(1) of the Act, 21 U.S.C.section 360bbb-3(b)(1), unless the authorization is terminated  or revoked sooner.       Influenza A by PCR NEGATIVE NEGATIVE Final   Influenza B by PCR NEGATIVE NEGATIVE Final    Comment: (NOTE) The Xpert Xpress SARS-CoV-2/FLU/RSV plus assay is intended as an aid in the diagnosis of influenza from Nasopharyngeal swab specimens and should not be used as a sole basis for treatment. Nasal washings and aspirates are unacceptable for Xpert Xpress SARS-CoV-2/FLU/RSV testing.  Fact Sheet for Patients: EntrepreneurPulse.com.au  Fact Sheet for Healthcare Providers: IncredibleEmployment.be  This test is not yet approved or cleared by the [place] FDA and has been authorized for detection and/or diagnosis of SARS-CoV-2 by FDA under an Emergency Use Authorization (EUA). This EUA will remain in effect (meaning this test can be used) for the  duration of the COVID-19 declaration under Section 564(b)(1) of the Act, 21 U.S.C. section 360bbb-3(b)(1), unless the authorization is terminated or revoked.  Performed at [facility], [address] [place], [place] [address]   MRSA PCR Screening     Status: None   Collection Time: 01/02/20  2:45 PM   Specimen: Nasopharyngeal  Result Value Ref Range Status   MRSA by PCR NEGATIVE NEGATIVE Final    Comment:        The GeneXpert MRSA Assay (FDA approved for NASAL specimens only), is one component of a comprehensive MRSA colonization surveillance program. It is not intended to diagnose MRSA infection nor to guide or monitor treatment for MRSA infections. Performed at [facility], [address] [place], [place] [address]   Culture, respiratory     Status: None (Preliminary result)   Collection Time: 01/02/20  5:00 PM   Specimen: INDUCED SPUTUM  Result Value Ref Range Status   Specimen Description   Final    INDUCED SPUTUM Performed at [facility], [address], [place], [place] [address]    Special Requests   Final    NONE Performed at [facility], [address] [place], [place] [address]    Gram Stain   Final    NO WBC SEEN RARE GRAM POSITIVE COCCI IN PAIRS IN CHAINS Performed at [facility], [place] [address], [place], [place] [address]    Culture PENDING  Incomplete   Report Status PENDING  Incomplete    Coagulation Studies: No results for input(s): LABPROT, INR in the last 72 hours.  Urinalysis: No results for input(s): COLORURINE, LABSPEC, PHURINE, GLUCOSEU, HGBUR, BILIRUBINUR, KETONESUR, PROTEINUR, UROBILINOGEN, NITRITE, LEUKOCYTESUR in the last 72 hours.  Invalid input(s): APPERANCEUR    Imaging: DG Chest 2 View  Result Date: 01/01/2020 CLINICAL DATA:  Chest pain EXAM: CHEST - 2 VIEW COMPARISON:  07/31/2019 FINDINGS: Linear areas of scarring within the lungs bilaterally. Heart is normal size. No acute  confluent opacities or effusions. No acute bony abnormality. IMPRESSION: Areas of scarring bilaterally, stable.  No active disease. Electronically Signed   By: Rolm Baptise M.D.   On: 01/01/2020 11:42   DG Chest Port 1 View  Result Date: 01/03/2020 CLINICAL DATA:  Intubation  EXAM: PORTABLE CHEST 1 VIEW COMPARISON:  01/02/2020 FINDINGS: Endotracheal and enteric tubes are unchanged in position. Shallow inspiration with atelectasis in the lung bases. Blunting of the costophrenic angles suggests small effusions. Linear scarring in the right lung. No significant change. IMPRESSION: Stable appearance of the chest. Electronically Signed   By: Lucienne Capers M.D.   On: 01/03/2020 05:46   DG Chest Portable 1 View  Result Date: 01/02/2020 CLINICAL DATA:  OG placement. EXAM: PORTABLE CHEST 1 VIEW COMPARISON:  January 01, 2020 FINDINGS: Endotracheal tube in satisfactory position. Enteric catheter with side hole at the expected location of the GE junction. Cardiomediastinal silhouette is normal. Mediastinal contours appear intact. Chronic abnormal appearance of the lungs with bilateral areas of peribronchial scarring/atelectasis. Osseous structures are without acute abnormality. Soft tissues are grossly normal. IMPRESSION: 1. Endotracheal tube in satisfactory position. 2. Enteric catheter with side hole at the expected location of the GE junction. Advancement with may be considered. 3. Stable abnormal appearance of the lungs with peribronchial areas of scarring/atelectasis and chronic elevation of the left hemidiaphragm. Electronically Signed   By: Fidela Salisbury M.D.   On: 01/02/2020 13:57      Assessment & Plan: Mr. KAJUAN GUYTON is a 49 y.o. black male with hypertension, coronary artery disease, systolic congestive heart failure, CVA, sleep apnea not on CPAP, sarcoidosis, hyperlipidemia, COPD, diabetes mellitus type II insulin dependent, status post partial nephrectomy for renal cell carcinoma, who  was admitted to [facility] on 01/01/2020 for Shortness of breath [R06.02] Hypercapnia [R06.89] COPD exacerbation (HCC) [J44.1] Acute respiratory failure with hypoxia and hypercapnia ([place]) [J96.01, J96.02] Acute and chr resp failure, unsp w hypoxia or hypercapnia ([place]) [J96.20]  1. Acute kidney injury on chronic kidney disease stage IIIA with proteinuria: baseline creatinine of 1.41, GFR of 58 on 08/01/19.  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  Acute kidney injury secondary to ATN from hypotension and hypoxia Nonoliguric urine output. No IV contrast exposure.  - Check renal ultrasound - hold lisinopril and furosemide - Start IV fluids: 1/2NS at 26m/hr - No indication for dialysis. Continue to monitor urine output, renal function, serum electrolytes and volume status.   2. Hypotension: requiring vasopressors: norepinephrine.  Holding home regimen of lisinopril, furosemide and isosorbide dinitrate.  - Empiric antibiotics: levofloxacin - Stress dose steroids: methylprednisolone.   3. Acute respiratory failure: requiring intubation and mechanical ventilation.  Acute exacerbation of COPD and sarcoidosis. 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   4. Diabetes mellitus type II with chronic kidney disease: insulin dependent. Hemoglobin A1c of 6.5%.   Discussed case with patient's wife, [name] Spouse has consented for dialysis and dialysis catheter.   LOS: 1 Elner Seifert 11/28/20219:07 AM

## 2020-01-03 NOTE — Progress Notes (Signed)
CRITICAL CARE NOTE 49 y.o.malewith medical history significant ofchronic combined systolic and diastolic CHF, COPD, CAD, history of MI, type 2 diabetes, hyperlipidemia, hypertension, sarcoidosis, sleep apnea not using CPAP regularly,  history of other nonhemorrhagic CVA who is brought to the emergency department with complaints of chest tightness, dyspnea associated with clear sputum productive cough that started about 2 to 3 weeks ago and has become progressively worse. Previous h/o [place]  SIGNIFICANT EVENTS 11/27 ICU admission, INTUBATED #8ETT 11/28 severe resp failure, progressive Renal failure, Nephrology consulted   CC  follow up respiratory failure  SUBJECTIVE Patient remains critically ill Prognosis is guarded   BP (!) 82/46   Pulse 71   Temp 98.1 F (36.7 C)   Resp 18   Ht 6' 1" (1.854 m)   Wt 121.9 kg   SpO2 97%   BMI 35.46 kg/m    I/O last 3 completed shifts: In: 611.5 [I.V.:523.1; IV Piggyback:88.4] Out: 580 [Urine:580] No intake/output data recorded.  SpO2: 97 % O2 Flow Rate (L/min): 3 L/min FiO2 (%): 40 %  Estimated body mass index is 35.46 kg/m as calculated from the following:   Height as of this encounter: 6' 1" (1.854 m).   Weight as of this encounter: 121.9 kg.    REVIEW OF SYSTEMS  PATIENT IS UNABLE TO PROVIDE COMPLETE REVIEW OF SYSTEMS DUE TO SEVERE CRITICAL ILLNESS        PHYSICAL EXAMINATION:  GENERAL:critically ill appearing, +resp distress HEAD: Normocephalic, atraumatic.  EYES: Pupils equal, round, reactive to light.  No scleral icterus.  MOUTH: Moist mucosal membrane. NECK: Supple.  PULMONARY: +rhonchi, +wheezing CARDIOVASCULAR: S1 and S2. Regular rate and rhythm. No murmurs, rubs, or gallops.  GASTROINTESTINAL: Soft, nontender, -distended.  Positive bowel sounds.   MUSCULOSKELETAL: + edema.  NEUROLOGIC: obtunded, GCS<8 SKIN:intact,warm,dry  MEDICATIONS: I have reviewed all medications and confirmed regimen  as documented   CULTURE RESULTS   Recent Results (from the past 240 hour(s))  Resp Panel by RT-PCR (Flu A&B, Covid) Nasopharyngeal Swab     Status: None   Collection Time: 01/01/20  3:13 PM   Specimen: Nasopharyngeal Swab; Nasopharyngeal(NP) swabs in vial transport medium  Result Value Ref Range Status   SARS Coronavirus 2 by RT PCR NEGATIVE NEGATIVE Final    Comment: (NOTE) SARS-CoV-2 target nucleic acids are NOT DETECTED.  The SARS-CoV-2 RNA is generally detectable in upper respiratory specimens during the acute phase of infection. The lowest concentration of SARS-CoV-2 viral copies this assay can detect is 138 copies/mL. A negative result does not preclude SARS-Cov-2 infection and should not be used as the sole basis for treatment or other patient management decisions. A negative result may occur with  improper specimen collection/handling, submission of specimen other than nasopharyngeal swab, presence of viral mutation(s) within the areas targeted by this assay, and inadequate number of viral copies(<138 copies/mL). A negative result must be combined with clinical observations, patient history, and epidemiological information. The expected result is Negative.  Fact Sheet for Patients:  EntrepreneurPulse.com.au  Fact Sheet for Healthcare Providers:  IncredibleEmployment.be  This test is no t yet approved or cleared by the [place] FDA and  has been authorized for detection and/or diagnosis of SARS-CoV-2 by FDA under an Emergency Use Authorization (EUA). This EUA will remain  in effect (meaning this test can be used) for the duration of the COVID-19 declaration under Section 564(b)(1) of the Act, 21 U.S.C.section 360bbb-3(b)(1), unless the authorization is terminated  or revoked sooner.  Influenza A by PCR NEGATIVE NEGATIVE Final   Influenza B by PCR NEGATIVE NEGATIVE Final    Comment: (NOTE) The Xpert Xpress  SARS-CoV-2/FLU/RSV plus assay is intended as an aid in the diagnosis of influenza from Nasopharyngeal swab specimens and should not be used as a sole basis for treatment. Nasal washings and aspirates are unacceptable for Xpert Xpress SARS-CoV-2/FLU/RSV testing.  Fact Sheet for Patients: EntrepreneurPulse.com.au  Fact Sheet for Healthcare Providers: IncredibleEmployment.be  This test is not yet approved or cleared by the [place] FDA and has been authorized for detection and/or diagnosis of SARS-CoV-2 by FDA under an Emergency Use Authorization (EUA). This EUA will remain in effect (meaning this test can be used) for the duration of the COVID-19 declaration under Section 564(b)(1) of the Act, 21 U.S.C. section 360bbb-3(b)(1), unless the authorization is terminated or revoked.  Performed at [facility], [address] [place], [place] [address]   MRSA PCR Screening     Status: None   Collection Time: 01/02/20  2:45 PM   Specimen: Nasopharyngeal  Result Value Ref Range Status   MRSA by PCR NEGATIVE NEGATIVE Final    Comment:        The GeneXpert MRSA Assay (FDA approved for NASAL specimens only), is one component of a comprehensive MRSA colonization surveillance program. It is not intended to diagnose MRSA infection nor to guide or monitor treatment for MRSA infections. Performed at [facility], [address] [place], [place] [address]   Culture, respiratory     Status: None (Preliminary result)   Collection Time: 01/02/20  5:00 PM   Specimen: INDUCED SPUTUM  Result Value Ref Range Status   Specimen Description   Final    INDUCED SPUTUM Performed at [facility], [address], [place], [place] [address]    Special Requests   Final    NONE Performed at [facility], [address] [place], [place] [address]    Gram Stain   Final    NO WBC SEEN RARE GRAM POSITIVE COCCI IN PAIRS IN  CHAINS Performed at [facility], [place] [address], [place],  [address]    Culture PENDING  Incomplete   Report Status PENDING  Incomplete          IMAGING    US RENAL  Result Date: 01/03/2020 CLINICAL DATA:  Acute kidney failure EXAM: RENAL ULTRASOUND COMPARISON:  None. FINDINGS: Right Kidney: Renal measurements: 9.7 x 7.0 x 5.4 cm = volume: 193 mL. Echogenicity and renal cortical thickness are within normal limits. No mass, perinephric fluid, or hydronephrosis visualized. No sonographically demonstrable calculus or ureterectasis. Left Kidney: Renal measurements: 9.9 x 4.9 x 6.1 cm = volume: 158 mL. Echogenicity and renal cortical thickness are within normal limits. No mass, perinephric fluid, or hydronephrosis visualized. No sonographically demonstrable calculus or ureterectasis. Bladder: Foley catheter in place. Urinary bladder decompressed and cannot be assessed. Other: None. IMPRESSION: Kidneys appear unremarkable bilaterally. Urinary bladder decompressed with Foley catheter and cannot be assessed at this time. Electronically Signed   By: Lowella Grip III M.D.   On: 01/03/2020 11:11   DG Chest Port 1 View  Result Date: 01/03/2020 CLINICAL DATA:  Intubation EXAM: PORTABLE CHEST 1 VIEW COMPARISON:  01/02/2020 FINDINGS: Endotracheal and enteric tubes are unchanged in position. Shallow inspiration with atelectasis in the lung bases. Blunting of the costophrenic angles suggests small effusions. Linear scarring in the right lung. No significant change. IMPRESSION: Stable appearance of the chest. Electronically Signed  By: Lucienne Capers M.D.   On: 01/03/2020 05:46   DG Chest Portable 1 View  Result Date: 01/02/2020 CLINICAL DATA:  OG placement. EXAM: PORTABLE CHEST 1 VIEW COMPARISON:  January 01, 2020 FINDINGS: Endotracheal tube in satisfactory position. Enteric catheter with side hole at the expected location of the GE junction. Cardiomediastinal silhouette is normal.  Mediastinal contours appear intact. Chronic abnormal appearance of the lungs with bilateral areas of peribronchial scarring/atelectasis. Osseous structures are without acute abnormality. Soft tissues are grossly normal. IMPRESSION: 1. Endotracheal tube in satisfactory position. 2. Enteric catheter with side hole at the expected location of the GE junction. Advancement with may be considered. 3. Stable abnormal appearance of the lungs with peribronchial areas of scarring/atelectasis and chronic elevation of the left hemidiaphragm. Electronically Signed   By: Fidela Salisbury M.D.   On: 01/02/2020 13:57     Nutrition Status:           Indwelling Urinary Catheter continued, requirement due to   Reason to continue Indwelling Urinary Catheter strict Intake/Output monitoring for hemodynamic instability         Ventilator continued, requirement due to severe respiratory failure   Ventilator Sedation RASS 0 to -2      ASSESSMENT AND PLAN SYNOPSIS 49 yo AAM with severe morbid obesity with severe biventricular heart failure with acute and severe resp failue from acute COPD exacerbation with acute CAP with SEPSIS PRESENT ON ADMISSION with severe metabolic encephalopathy from hypoxia and hypercapnia with progressive renal failure  Severe ACUTE Hypoxic and Hypercapnic Respiratory Failure -continue Full MV support -continue Bronchodilator Therapy -Wean Fio2 and PEEP as tolerated -will perform SAT/SBT when respiratory parameters are met -VAP/VENT bundle implementation  ACUTE BIVENTRICULAR  CARDIAC FAILURE-  -oxygen as needed -Lasix as tolerated -follow up cardiac enzymes as indicated -follow up cardiology recs   Morbid obesity, possible OSA.   Will certainly impact respiratory mechanics, ventilator weaning Suspect will need to consider additional PEEP  ACUTE KIDNEY INJURY/Renal Failure -continue Foley Catheter-assess need -Avoid nephrotoxic agents -Follow urine output,  BMP -Ensure adequate renal perfusion, optimize oxygenation -Renal dose medications Nephrology consulted     NEUROLOGY Acute toxic metabolic encephalopathy, need for sedation Goal RASS -2 to -3  SHOCK-SEPSIS/CARDIOGENIC -use vasopressors to keep MAP>65 as needed  CARDIAC ICU monitoring  ID abx for CAP -continue IV abx as prescibed -follow up cultures  GI GI PROPHYLAXIS as indicated  NUTRITIONAL STATUS Nutrition Status:   DIET-->TF's as tolerated Constipation protocol as indicated  ENDO - will use ICU hypoglycemic\Hyperglycemia protocol if indicated     ELECTROLYTES -follow labs as needed -replace as needed -pharmacy consultation and following   DVT/GI PRX ordered and assessed TRANSFUSIONS AS NEEDED MONITOR FSBS I Assessed the need for Labs I Assessed the need for Foley I Assessed the need for Central Venous Line Family Discussion when available I Assessed the need for Mobilization I made an Assessment of medications to be adjusted accordingly Safety Risk assessment completed   CASE DISCUSSED IN MULTIDISCIPLINARY ROUNDS WITH ICU TEAM  Critical Care Time devoted to patient care services described in this note is 54  minutes.   Overall, patient is critically ill, prognosis is guarded/poor.  Patient with Multiorgan failure and at high risk for cardiac arrest and death.     Corrin Parker, M.D.  [facility]  Medical Director [place] Director [facility] Cardio-Pulmonary Department

## 2020-01-03 NOTE — Consult Note (Signed)
PHARMACY CONSULT NOTE - FOLLOW UP  Pharmacy Consult for Electrolyte Monitoring and Replacement   Recent Labs: Potassium (mmol/L)  Date Value  01/03/2020 4.3  05/28/2014 3.9   Magnesium (mg/dL)  Date Value  01/03/2020 2.2  05/14/2013 1.8   Calcium (mg/dL)  Date Value  01/03/2020 8.5 (L)   Calcium, Total (mg/dL)  Date Value  05/28/2014 8.4 (L)   Albumin (g/dL)  Date Value  01/02/2020 3.7  06/01/2013 3.8   Phosphorus (mg/dL)  Date Value  01/03/2020 1.4 (L)  03/09/2013 3.0   Sodium (mmol/L)  Date Value  01/03/2020 145  05/28/2014 140     Assessment: Phos 1.4 today no recent lab to trend.  Other electrolytes WNL  Goal of Therapy:  [place] WNL   Plan:  Phos 1.4; Will replete with Phos-NaK (K<4.5) oral d/t national shortage of IV phos and reassess tomorrow with AM lab. Will follow with BMP, Mg, Phos tomorrow AM.  Lorna Dibble ,PharmD Clinical Pharmacist 01/03/2020 8:39 AM

## 2020-01-04 ENCOUNTER — Inpatient Hospital Stay: Payer: Medicare HMO

## 2020-01-04 LAB — GLUCOSE, CAPILLARY
Glucose-Capillary: 136 mg/dL — ABNORMAL HIGH (ref 70–99)
Glucose-Capillary: 140 mg/dL — ABNORMAL HIGH (ref 70–99)
Glucose-Capillary: 151 mg/dL — ABNORMAL HIGH (ref 70–99)
Glucose-Capillary: 171 mg/dL — ABNORMAL HIGH (ref 70–99)
Glucose-Capillary: 178 mg/dL — ABNORMAL HIGH (ref 70–99)
Glucose-Capillary: 179 mg/dL — ABNORMAL HIGH (ref 70–99)
Glucose-Capillary: 92 mg/dL (ref 70–99)
Glucose-Capillary: 93 mg/dL (ref 70–99)

## 2020-01-04 LAB — BLOOD GAS, ARTERIAL
Acid-Base Excess: 14 mmol/L — ABNORMAL HIGH (ref 0.0–2.0)
Acid-Base Excess: 8.5 mmol/L — ABNORMAL HIGH (ref 0.0–2.0)
Bicarbonate: 40.5 mmol/L — ABNORMAL HIGH (ref 20.0–28.0)
Bicarbonate: 40.5 mmol/L — ABNORMAL HIGH (ref 20.0–28.0)
FIO2: 0.4
FIO2: 0.5
MECHVT: 500 mL
MECHVT: 500 mL
O2 Saturation: 98.6 %
O2 Saturation: 97.4 %
PEEP: 8 cmH2O
PEEP: 5 cmH2O
Patient temperature: 37
Patient temperature: 37
RATE: 20 resp/min
RATE: 18 resp/min
pCO2 arterial: 57 mmHg — ABNORMAL HIGH (ref 32.0–48.0)
pCO2 arterial: 99 mmHg (ref 32.0–48.0)
pH, Arterial: 7.46 — ABNORMAL HIGH (ref 7.350–7.450)
pH, Arterial: 7.22 — ABNORMAL LOW (ref 7.350–7.450)
pO2, Arterial: 111 mmHg — ABNORMAL HIGH (ref 83.0–108.0)
pO2, Arterial: 112 mmHg — ABNORMAL HIGH (ref 83.0–108.0)

## 2020-01-04 LAB — BASIC METABOLIC PANEL
Anion gap: 7 (ref 5–15)
BUN: 55 mg/dL — ABNORMAL HIGH (ref 6–20)
CO2: 36 mmol/L — ABNORMAL HIGH (ref 22–32)
Calcium: 8.1 mg/dL — ABNORMAL LOW (ref 8.9–10.3)
Chloride: 100 mmol/L (ref 98–111)
Creatinine, Ser: 2.42 mg/dL — ABNORMAL HIGH (ref 0.61–1.24)
GFR, Estimated: 32 mL/min — ABNORMAL LOW (ref >60)
Glucose, Bld: 148 mg/dL — ABNORMAL HIGH (ref 70–99)
Potassium: 4.5 mmol/L (ref 3.5–5.1)
Sodium: 143 mmol/L (ref 135–145)

## 2020-01-04 LAB — CBC
HCT: 44.5 % (ref 39.0–52.0)
Hemoglobin: 13.6 g/dL (ref 13.0–17.0)
MCH: 28.4 pg (ref 26.0–34.0)
MCHC: 30.6 g/dL (ref 30.0–36.0)
MCV: 92.9 fL (ref 80.0–100.0)
Platelets: 197 10*3/uL (ref 150–400)
RBC: 4.79 MIL/uL (ref 4.22–5.81)
RDW: 14 % (ref 11.5–15.5)
WBC: 12.9 10*3/uL — ABNORMAL HIGH (ref 4.0–10.5)
nRBC: 0 % (ref 0.0–0.2)

## 2020-01-04 LAB — TRIGLYCERIDES
Triglycerides: 143 mg/dL (ref <150)
Triglycerides: 112 mg/dL (ref <150)

## 2020-01-04 LAB — PHOSPHORUS: Phosphorus: 4.1 mg/dL (ref 2.5–4.6)

## 2020-01-04 LAB — MAGNESIUM: Magnesium: 2.5 mg/dL — ABNORMAL HIGH (ref 1.7–2.4)

## 2020-01-04 MED ORDER — SODIUM CHLORIDE 0.9 % IV SOLN
250.0000 mL | INTRAVENOUS | Status: DC
  Administered 2020-01-10: 250 mL via INTRAVENOUS

## 2020-01-04 MED ORDER — ROCURONIUM BROMIDE 50 MG/5ML IV SOLN
INTRAVENOUS | Status: AC
  Administered 2020-01-04: 10 mg
  Filled 2020-01-04: qty 1

## 2020-01-04 MED ORDER — METOPROLOL TARTRATE 5 MG/5ML IV SOLN
5.0000 mg | Freq: Four times a day (QID) | INTRAVENOUS | Status: DC | PRN
  Administered 2020-01-04 – 2020-01-18 (×2): 5 mg via INTRAVENOUS
  Filled 2020-01-04: qty 5

## 2020-01-04 MED ORDER — ENOXAPARIN SODIUM 150 MG/ML ~~LOC~~ SOLN
125.0000 mg | Freq: Two times a day (BID) | SUBCUTANEOUS | Status: DC
  Administered 2020-01-04 – 2020-01-06 (×5): 125 mg via SUBCUTANEOUS
  Filled 2020-01-04 (×5): qty 0.84

## 2020-01-04 MED ORDER — METOPROLOL TARTRATE 5 MG/5ML IV SOLN
INTRAVENOUS | Status: AC
  Filled 2020-01-04: qty 5

## 2020-01-04 MED ORDER — PROPOFOL 1000 MG/100ML IV EMUL
5.0000 ug/kg/min | INTRAVENOUS | Status: DC
  Administered 2020-01-04 – 2020-01-05 (×4): 25 ug/kg/min via INTRAVENOUS
  Administered 2020-01-05: 40 ug/kg/min via INTRAVENOUS
  Administered 2020-01-06: 25 ug/kg/min via INTRAVENOUS
  Administered 2020-01-06 (×2): 30 ug/kg/min via INTRAVENOUS
  Administered 2020-01-06: 25 ug/kg/min via INTRAVENOUS
  Administered 2020-01-06: 10 ug/kg/min via INTRAVENOUS
  Administered 2020-01-07 (×2): 30 ug/kg/min via INTRAVENOUS
  Administered 2020-01-07: 40 ug/kg/min via INTRAVENOUS
  Administered 2020-01-07 (×2): 30 ug/kg/min via INTRAVENOUS
  Administered 2020-01-07: 40 ug/kg/min via INTRAVENOUS
  Administered 2020-01-08: 35 ug/kg/min via INTRAVENOUS
  Administered 2020-01-08: 40 ug/kg/min via INTRAVENOUS
  Administered 2020-01-08 (×2): 35 ug/kg/min via INTRAVENOUS
  Administered 2020-01-08: 50 ug/kg/min via INTRAVENOUS
  Administered 2020-01-08: 30 ug/kg/min via INTRAVENOUS
  Administered 2020-01-09: 40 ug/kg/min via INTRAVENOUS
  Administered 2020-01-09: 50 ug/kg/min via INTRAVENOUS
  Filled 2020-01-04 (×26): qty 100

## 2020-01-04 MED ORDER — FAMOTIDINE IN NACL 20-0.9 MG/50ML-% IV SOLN
20.0000 mg | Freq: Two times a day (BID) | INTRAVENOUS | Status: DC
  Administered 2020-01-04 – 2020-01-14 (×21): 20 mg via INTRAVENOUS
  Filled 2020-01-04 (×24): qty 50

## 2020-01-04 MED ORDER — FENTANYL BOLUS VIA INFUSION
150.0000 ug | Freq: Once | INTRAVENOUS | Status: AC
  Administered 2020-01-04: 150 ug via INTRAVENOUS

## 2020-01-04 MED ORDER — PROPOFOL 1000 MG/100ML IV EMUL
INTRAVENOUS | Status: AC
  Filled 2020-01-04: qty 100

## 2020-01-04 MED ORDER — METHYLPREDNISOLONE SODIUM SUCC 40 MG IJ SOLR
40.0000 mg | INTRAMUSCULAR | Status: DC
  Administered 2020-01-05: 40 mg via INTRAVENOUS
  Filled 2020-01-04: qty 1

## 2020-01-04 NOTE — Procedures (Signed)
Endotracheal Intubation: Patient required placement of an artificial airway secondary to Respiratory Failure  Consent: Emergent patient unresponsive on BIPAP - witnessed by RN Katharine Look and RT Amy and Freda  Hand washing performed prior to starting the procedure.   Medications administered for sedation prior to procedure:    Rocuronium 50 mg IV, Fentanyl 100 mcg IV.    A time out procedure was called and correct patient, name, & ID confirmed. Needed supplies and equipment were assembled and checked to include ETT, 10 ml syringe, Glidescope, Mac and Miller blades, suction, oxygen and bag mask valve, end tidal CO2 monitor.   Patient was positioned to align the mouth and pharynx to facilitate visualization of the glottis.   Heart rate, SpO2 and blood pressure was continuously monitored during the procedure. Pre-oxygenation was conducted prior to intubation and endotracheal tube was placed through the vocal cords into the trachea.     The artificial airway was placed under direct visualization via glidescope route using a 8 ETT on the first attempt.  ETT was secured at 25 cm mark.  Placement was confirmed by auscuitation of lungs with good breath sounds bilaterally and no stomach sounds.  Condensation was noted on endotracheal tube.   Pulse ox 98%.  CO2 detector in place with appropriate color change.   Complications: None .     Chest radiograph ordered and pending.   Comments: OGT placed via glidescope.      Ottie Glazier, M.D.  P[facility] & [place]

## 2020-01-04 NOTE — Progress Notes (Signed)
Patient extubated this am, re-intubated 10:45 am. Patients blood pressure 220's, oxygen level 70's, diaphoretic and not responding. Attempted bi-pap prior to intubation. Patient wife Mrs. [place] notified. Continue to assess.

## 2020-01-04 NOTE — Consult Note (Signed)
PHARMACY CONSULT NOTE  Pharmacy Consult for Electrolyte Monitoring and Replacement   Recent Labs: Potassium (mmol/L)  Date Value  01/04/2020 4.5  05/28/2014 3.9   Magnesium (mg/dL)  Date Value  01/04/2020 2.5 (H)  05/14/2013 1.8   Calcium (mg/dL)  Date Value  01/04/2020 8.1 (L)   Calcium, Total (mg/dL)  Date Value  05/28/2014 8.4 (L)   Albumin (g/dL)  Date Value  01/02/2020 3.7  06/01/2013 3.8   Phosphorus (mg/dL)  Date Value  01/04/2020 4.1  03/09/2013 3.0   Sodium (mmol/L)  Date Value  01/04/2020 143  05/28/2014 140   Corrected Ca: 8.3 mg/dL  Assessment: 49 year old male with PMHx of HTN, DM, COPD, CAD, hx CVA, sarcoidosis, CHF, afib on Eliquis, CKD 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 The patient failed an extubation trial this morning.  Goal of Therapy:  Potassium 4.0 - 5.1 mmol/L Magnesium 2.0 - 2.4 mg/dL Electrolytes WNL  Plan:   No electrolyte replacement required today  reassess tomorrow with AM labs  Dallie Piles ,PharmD Clinical Pharmacist 01/04/2020 7:55 AM

## 2020-01-04 NOTE — Progress Notes (Signed)
CRITICAL CARE NOTE 49 y.o.malewith medical history significant ofchronic combined systolic and diastolic CHF, COPD, CAD, history of MI, type 2 diabetes, hyperlipidemia, hypertension, sarcoidosis, sleep apnea not using CPAP regularly,  history of other nonhemorrhagic CVA who is brought to the emergency department with complaints of chest tightness, dyspnea associated with clear sputum productive cough that started about 2 to 3 weeks ago and has become progressively worse. Previous h/o [place]  SIGNIFICANT EVENTS 11/27 ICU admission, INTUBATED #[id number] 11/28 severe resp failure, progressive Renal failure, Nephrology consulted 11/29 - patient passed SBT today, able to communicate/follow directions while intubated status post extubation to BIPAP - after 1 hour on BIPAP patient became unresponsive and required re-intubation. I have met with mother in law at bedside and reviewed careplan.   CC  follow up respiratory failure  SUBJECTIVE Patient remains critically ill Prognosis is guarded   BP 113/74   Pulse 68   Temp 97.9 F (36.6 C)   Resp 18   Ht _0  (1.854 m)   Wt 121.9 kg   SpO2 99%   BMI 35.46 kg/m    I/O last 3 completed shifts: In: 3860.1 [I.V.:3711.7; NG/GT:60; IV Piggyback:88.4] Out: 1580 [Urine:955; Emesis/NG output:625] Total I/O In: -  Out: 350 [Urine:350]  SpO2: 99 % O2 Flow Rate (L/min): 3 L/min FiO2 (%): 40 %  Estimated body mass index is 35.46 kg/m as calculated from the following:   Height as of this encounter: _1  (1.854 m).   Weight as of this encounter: 121.9 kg.    REVIEW OF SYSTEMS  PATIENT IS UNABLE TO PROVIDE COMPLETE REVIEW OF SYSTEMS DUE TO SEVERE CRITICAL ILLNESS        PHYSICAL EXAMINATION:  GENERAL:critically ill appearing, +resp distress HEAD: Normocephalic, atraumatic.  EYES: Pupils equal, round, reactive to light.  No scleral icterus.  MOUTH: Moist mucosal membrane. NECK: Supple.  PULMONARY: +rhonchi,  +wheezing CARDIOVASCULAR: S1 and S2. Regular rate and rhythm. No murmurs, rubs, or gallops.  GASTROINTESTINAL: Soft, nontender, -distended.  Positive bowel sounds.   MUSCULOSKELETAL: + edema.  NEUROLOGIC: obtunded, GCS<8 SKIN:intact,warm,dry  MEDICATIONS: I have reviewed all medications and confirmed regimen as documented   CULTURE RESULTS   Recent Results (from the past 240 hour(s))  Resp Panel by RT-PCR (Flu A&B, Covid) Nasopharyngeal Swab     Status: None   Collection Time: 01/01/20  3:13 PM   Specimen: Nasopharyngeal Swab; Nasopharyngeal(NP) swabs in vial transport medium  Result Value Ref Range Status   SARS Coronavirus 2 by RT PCR NEGATIVE NEGATIVE Final    Comment: (NOTE) SARS-CoV-2 target nucleic acids are NOT DETECTED.  The SARS-CoV-2 RNA is generally detectable in upper respiratory specimens during the acute phase of infection. The lowest concentration of SARS-CoV-2 viral copies this assay can detect is 138 copies/mL. A negative result does not preclude SARS-Cov-2 infection and should not be used as the sole basis for treatment or other patient management decisions. A negative result may occur with  improper specimen collection/handling, submission of specimen other than nasopharyngeal swab, presence of viral mutation(s) within the areas targeted by this assay, and inadequate number of viral copies(<138 copies/mL). A negative result must be combined with clinical observations, patient history, and epidemiological information. The expected result is Negative.  Fact Sheet for Patients:  EntrepreneurPulse.com.au  Fact Sheet for Healthcare Providers:  IncredibleEmployment.be  This test is no t yet approved or cleared by the [place] FDA and  has been authorized for detection and/or diagnosis of SARS-CoV-2 by FDA under  an Emergency Use Authorization (EUA). This EUA will remain  in effect (meaning this test can be used) for the  duration of the COVID-19 declaration under Section 564(b)(1) of the Act, 21 U.S.C.section 360bbb-3(b)(1), unless the authorization is terminated  or revoked sooner.       Influenza A by PCR NEGATIVE NEGATIVE Final   Influenza B by PCR NEGATIVE NEGATIVE Final    Comment: (NOTE) The Xpert Xpress SARS-CoV-2/FLU/RSV plus assay is intended as an aid in the diagnosis of influenza from Nasopharyngeal swab specimens and should not be used as a sole basis for treatment. Nasal washings and aspirates are unacceptable for Xpert Xpress SARS-CoV-2/FLU/RSV testing.  Fact Sheet for Patients: EntrepreneurPulse.com.au  Fact Sheet for Healthcare Providers: IncredibleEmployment.be  This test is not yet approved or cleared by the [place] FDA and has been authorized for detection and/or diagnosis of SARS-CoV-2 by FDA under an Emergency Use Authorization (EUA). This EUA will remain in effect (meaning this test can be used) for the duration of the COVID-19 declaration under Section 564(b)(1) of the Act, 21 U.S.C. section 360bbb-3(b)(1), unless the authorization is terminated or revoked.  Performed at [facility], [address] [place], [place] [address]   MRSA PCR Screening     Status: None   Collection Time: 01/02/20  2:45 PM   Specimen: Nasopharyngeal  Result Value Ref Range Status   MRSA by PCR NEGATIVE NEGATIVE Final    Comment:        The GeneXpert MRSA Assay (FDA approved for NASAL specimens only), is one component of a comprehensive MRSA colonization surveillance program. It is not intended to diagnose MRSA infection nor to guide or monitor treatment for MRSA infections. Performed at [facility], [address] [place], [place] [address]   Culture, respiratory     Status: None (Preliminary result)   Collection Time: 01/02/20  5:00 PM   Specimen: INDUCED SPUTUM  Result Value Ref Range Status   Specimen Description    Final    INDUCED SPUTUM Performed at [facility], [address] [place], [place] [address]    Special Requests   Final    NONE Performed at [facility], [address] [place], [place] [address]    Gram Stain   Final    NO WBC SEEN RARE GRAM POSITIVE COCCI IN PAIRS IN CHAINS    Culture   Final    FEW Normal respiratory flora-no Staph aureus or Pseudomonas seen Performed at [facility], [address], [place], [place] [address]    Report Status PENDING  Incomplete          IMAGING    US RENAL  Result Date: 01/03/2020 CLINICAL DATA:  Acute kidney failure EXAM: RENAL ULTRASOUND COMPARISON:  None. FINDINGS: Right Kidney: Renal measurements: 9.7 x 7.0 x 5.4 cm = volume: 193 mL. Echogenicity and renal cortical thickness are within normal limits. No mass, perinephric fluid, or hydronephrosis visualized. No sonographically demonstrable calculus or ureterectasis. Left Kidney: Renal measurements: 9.9 x 4.9 x 6.1 cm = volume: 158 mL. Echogenicity and renal cortical thickness are within normal limits. No mass, perinephric fluid, or hydronephrosis visualized. No sonographically demonstrable calculus or ureterectasis. Bladder: Foley catheter in place. Urinary bladder decompressed and cannot be assessed. Other: None. IMPRESSION: Kidneys appear unremarkable bilaterally. Urinary bladder decompressed with Foley catheter and cannot be assessed at this time. Electronically Signed   By: Lowella Grip III M.D.   On: 01/03/2020 11:11   DG  Chest Port 1 View  Result Date: 01/04/2020 CLINICAL DATA:  Respiratory failure EXAM: PORTABLE CHEST 1 VIEW COMPARISON:  01/03/2020 FINDINGS: Endotracheal tube is seen 5.1 cm above the carina. Nasogastric tube extends into the upper abdomen beyond the margin of the examination. Linear scarring within the right mid lung zone and left lung base are again identified. Mild asymmetric left-sided volume loss is unchanged. Tiny left pleural  effusion or pleural scarring is seen at the left costophrenic angle. No superimposed confluent pulmonary infiltrate. No pneumothorax. Cardiac size within normal limits. Pulmonary vascularity is normal. IMPRESSION: Stable support tubes. Unchanged examination with multifocal regions of parenchymal scarring and tiny left pleural effusion or pleural scarring. Electronically Signed   By: Fidela Salisbury MD   On: 01/04/2020 04:55     Nutrition Status:           Indwelling Urinary Catheter continued, requirement due to   Reason to continue Indwelling Urinary Catheter strict Intake/Output monitoring for hemodynamic instability         Ventilator continued, requirement due to severe respiratory failure   Ventilator Sedation RASS 0 to -2      ASSESSMENT AND PLAN  49 yo AAM with severe morbid obesity with severe biventricular heart failure with acute and severe resp failue from acute COPD exacerbation with acute CAP with SEPSIS PRESENT ON ADMISSION with severe metabolic encephalopathy from hypoxia and hypercapnia with progressive renal failure  Severe ACUTE Hypoxic and Hypercapnic Respiratory Failure -continue Full MV support -continue Bronchodilator Therapy -Wean Fio2 and PEEP as tolerated -will perform SAT/SBT when respiratory parameters are met -VAP/VENT bundle implementation           Tracheal aspirate - 11/29-  On levofloxacin at this time         ABG reviewed - 11/29 - ventilator management performed -discussed with RT   ACUTE BIVENTRICULAR  CARDIAC FAILURE-  -oxygen as needed -Lasix as tolerated -follow up cardiology recs   Morbid obesity, possible OSA.   Will certainly impact respiratory mechanics, ventilator weaning Suspect will need to consider additional PEEP    ACUTE KIDNEY INJURY/Renal Failure -continue Foley Catheter-assess need -Avoid nephrotoxic agents -Follow urine output, BMP -Ensure adequate renal perfusion, optimize oxygenation -Renal dose  medications Nephrology consulted     NEUROLOGY Acute toxic metabolic encephalopathy, need for sedation Goal RASS -2 to -3    CARDIAC ICU monitoring  ID abx for CAP -continue IV abx as prescibed -follow up cultures  GI GI PROPHYLAXIS as indicated  NUTRITIONAL STATUS Nutrition Status:   DIET-->TF's as tolerated Constipation protocol as indicated  ENDO - will use ICU hypoglycemic\Hyperglycemia protocol if indicated     ELECTROLYTES -follow labs as needed -replace as needed -pharmacy consultation and following   DVT/GI PRX ordered and assessed TRANSFUSIONS AS NEEDED MONITOR FSBS I Assessed the need for Labs I Assessed the need for Foley I Assessed the need for Central Venous Line Family Discussion when available I Assessed the need for Mobilization I made an Assessment of medications to be adjusted accordingly Safety Risk assessment completed   CASE DISCUSSED IN MULTIDISCIPLINARY ROUNDS WITH ICU TEAM  Critical Care Time devoted to patient care services described in this note is 34  minutes.   Overall, patient is critically ill, prognosis is guarded/poor.  Patient with Multiorgan failure and at high risk for cardiac arrest and death.      Ottie Glazier, M.D.  Pulmonary & [place]

## 2020-01-04 NOTE — Progress Notes (Signed)
[facility], [place] 01/04/20  Subjective:   Hospital day # 2 reintubated this morning for resp distress  cvs: tachycardic Pulm: vent dependent Renal: 11/28 0701 - 11/29 0700 In: 3249.5 [I.V.:3189.5; NG/GT:60] Out: 1500 [Urine:875; Emesis/NG output:625] Lab Results  Component Value Date   CREATININE 2.42 (H) 01/04/2020   CREATININE 2.59 (H) 01/03/2020   CREATININE 1.55 (H) 01/02/2020     Objective:  Vital signs in last 24 hours:  Temp:  [97.5 F (36.4 C)-98.4 F (36.9 C)] 97.9 F (36.6 C) (11/29 0800) Pulse Rate:  [60-68] 68 (11/29 0800) Resp:  [18-24] 18 (11/29 0800) BP: (91-137)/(53-89) 113/74 (11/29 0800) SpO2:  [96 %-100 %] 99 % (11/29 0800) FiO2 (%):  [40 %] 40 % (11/29 0600)  Weight change:  Filed Weights   01/01/20 1114 01/02/20 1439  Weight: 123.9 kg 121.9 kg    Intake/Output:    Intake/Output Summary (Last 24 hours) at 01/04/2020 1101 Last data filed at 01/04/2020 0833 Gross per 24 hour  Intake 3249.5 ml  Output 1850 ml  Net 1399.5 ml     Physical Exam: General: Critically ill appearing  HEENT ETT in place  Pulm/lungs Vent assisted  CVS/Heart tachycardic  Abdomen:  soft  Extremities: + edema  Neurologic: sedated  Skin: warm          Basic Metabolic Panel:  Recent Labs  Lab 01/01/20 1117 01/01/20 1117 01/02/20 0421 01/03/20 0455 01/04/20 0440  NA 143  --  146* 145 143  K 4.3  --  5.7* 4.3 4.5  CL 94*  --  99 98 100  CO2 40*  --  40* 36* 36*  GLUCOSE 239*  --  173* 161* 148*  BUN 20  --  23* 47* 55*  CREATININE 1.61*  --  1.55* 2.59* 2.42*  CALCIUM 9.1   < > 8.5* 8.5* 8.1*  MG  --   --   --  2.2 2.5*  PHOS  --   --   --  1.4* 4.1   < > = values in this interval not displayed.     CBC: Recent Labs  Lab 01/01/20 1117 01/04/20 0440  WBC 8.6 12.9*  HGB 15.4 13.6  HCT 50.3 44.5  MCV 94.0 92.9  PLT 208 197      Lab Results  Component Value Date   HEPBSAG NON REACTIVE 01/03/2020    HEPBSAB NON REACTIVE 01/03/2020   HEPBIGM NON REACTIVE 01/03/2020      Microbiology:  Recent Results (from the past 240 hour(s))  Resp Panel by RT-PCR (Flu A&B, Covid) Nasopharyngeal Swab     Status: None   Collection Time: 01/01/20  3:13 PM   Specimen: Nasopharyngeal Swab; Nasopharyngeal(NP) swabs in vial transport medium  Result Value Ref Range Status   SARS Coronavirus 2 by RT PCR NEGATIVE NEGATIVE Final    Comment: (NOTE) SARS-CoV-2 target nucleic acids are NOT DETECTED.  The SARS-CoV-2 RNA is generally detectable in upper respiratory specimens during the acute phase of infection. The lowest concentration of SARS-CoV-2 viral copies this assay can detect is 138 copies/mL. A negative result does not preclude SARS-Cov-2 infection and should not be used as the sole basis for treatment or other patient management decisions. A negative result may occur with  improper specimen collection/handling, submission of specimen other than nasopharyngeal swab, presence of viral mutation(s) within the areas targeted by this assay, and inadequate number of viral copies(<138 copies/mL). A negative result must be combined with clinical observations, patient history,  and epidemiological information. The expected result is Negative.  Fact Sheet for Patients:  EntrepreneurPulse.com.au  Fact Sheet for Healthcare Providers:  IncredibleEmployment.be  This test is no t yet approved or cleared by the [place] FDA and  has been authorized for detection and/or diagnosis of SARS-CoV-2 by FDA under an Emergency Use Authorization (EUA). This EUA will remain  in effect (meaning this test can be used) for the duration of the COVID-19 declaration under Section 564(b)(1) of the Act, 21 U.S.C.section 360bbb-3(b)(1), unless the authorization is terminated  or revoked sooner.       Influenza A by PCR NEGATIVE NEGATIVE Final   Influenza B by PCR NEGATIVE NEGATIVE  Final    Comment: (NOTE) The Xpert Xpress SARS-CoV-2/FLU/RSV plus assay is intended as an aid in the diagnosis of influenza from Nasopharyngeal swab specimens and should not be used as a sole basis for treatment. Nasal washings and aspirates are unacceptable for Xpert Xpress SARS-CoV-2/FLU/RSV testing.  Fact Sheet for Patients: EntrepreneurPulse.com.au  Fact Sheet for Healthcare Providers: IncredibleEmployment.be  This test is not yet approved or cleared by the [place] FDA and has been authorized for detection and/or diagnosis of SARS-CoV-2 by FDA under an Emergency Use Authorization (EUA). This EUA will remain in effect (meaning this test can be used) for the duration of the COVID-19 declaration under Section 564(b)(1) of the Act, 21 U.S.C. section 360bbb-3(b)(1), unless the authorization is terminated or revoked.  Performed at [facility], [address] [place], [place] [address]   MRSA PCR Screening     Status: None   Collection Time: 01/02/20  2:45 PM   Specimen: Nasopharyngeal  Result Value Ref Range Status   MRSA by PCR NEGATIVE NEGATIVE Final    Comment:        The GeneXpert MRSA Assay (FDA approved for NASAL specimens only), is one component of a comprehensive MRSA colonization surveillance program. It is not intended to diagnose MRSA infection nor to guide or monitor treatment for MRSA infections. Performed at [facility], [address] [place], [place] [address]   Culture, respiratory     Status: None (Preliminary result)   Collection Time: 01/02/20  5:00 PM   Specimen: INDUCED SPUTUM  Result Value Ref Range Status   Specimen Description   Final    INDUCED SPUTUM Performed at [facility], [address], [place], [place] [address]    Special Requests   Final    NONE Performed at [facility], [address] [place], [place] [address]    Gram Stain   Final    NO WBC  SEEN RARE GRAM POSITIVE COCCI IN PAIRS IN CHAINS    Culture   Final    FEW Normal respiratory flora-no Staph aureus or Pseudomonas seen Performed at [facility], [address], [place], [place] [address]    Report Status PENDING  Incomplete    Coagulation Studies: No results for input(s): LABPROT, INR in the last 72 hours.  Urinalysis: No results for input(s): COLORURINE, LABSPEC, PHURINE, GLUCOSEU, HGBUR, BILIRUBINUR, KETONESUR, PROTEINUR, UROBILINOGEN, NITRITE, LEUKOCYTESUR in the last 72 hours.  Invalid input(s): APPERANCEUR    Imaging: US RENAL  Result Date: 01/03/2020 CLINICAL DATA:  Acute kidney failure EXAM: RENAL ULTRASOUND COMPARISON:  None. FINDINGS: Right Kidney: Renal measurements: 9.7 x 7.0 x 5.4 cm = volume: 193 mL. Echogenicity and renal cortical thickness are within normal limits. No mass, perinephric fluid, or hydronephrosis visualized. No sonographically demonstrable calculus or ureterectasis. Left  Kidney: Renal measurements: 9.9 x 4.9 x 6.1 cm = volume: 158 mL. Echogenicity and renal cortical thickness are within normal limits. No mass, perinephric fluid, or hydronephrosis visualized. No sonographically demonstrable calculus or ureterectasis. Bladder: Foley catheter in place. Urinary bladder decompressed and cannot be assessed. Other: None. IMPRESSION: Kidneys appear unremarkable bilaterally. Urinary bladder decompressed with Foley catheter and cannot be assessed at this time. Electronically Signed   By: Lowella Grip III M.D.   On: 01/03/2020 11:11   DG Chest Port 1 View  Result Date: 01/04/2020 CLINICAL DATA:  Respiratory failure EXAM: PORTABLE CHEST 1 VIEW COMPARISON:  01/03/2020 FINDINGS: Endotracheal tube is seen 5.1 cm above the carina. Nasogastric tube extends into the upper abdomen beyond the margin of the examination. Linear scarring within the right mid lung zone and left lung base are again identified. Mild asymmetric left-sided volume loss is  unchanged. Tiny left pleural effusion or pleural scarring is seen at the left costophrenic angle. No superimposed confluent pulmonary infiltrate. No pneumothorax. Cardiac size within normal limits. Pulmonary vascularity is normal. IMPRESSION: Stable support tubes. Unchanged examination with multifocal regions of parenchymal scarring and tiny left pleural effusion or pleural scarring. Electronically Signed   By: Fidela Salisbury MD   On: 01/04/2020 04:55   DG Chest Port 1 View  Result Date: 01/03/2020 CLINICAL DATA:  Intubation EXAM: PORTABLE CHEST 1 VIEW COMPARISON:  01/02/2020 FINDINGS: Endotracheal and enteric tubes are unchanged in position. Shallow inspiration with atelectasis in the lung bases. Blunting of the costophrenic angles suggests small effusions. Linear scarring in the right lung. No significant change. IMPRESSION: Stable appearance of the chest. Electronically Signed   By: Lucienne Capers M.D.   On: 01/03/2020 05:46   DG Chest Portable 1 View  Result Date: 01/02/2020 CLINICAL DATA:  OG placement. EXAM: PORTABLE CHEST 1 VIEW COMPARISON:  January 01, 2020 FINDINGS: Endotracheal tube in satisfactory position. Enteric catheter with side hole at the expected location of the GE junction. Cardiomediastinal silhouette is normal. Mediastinal contours appear intact. Chronic abnormal appearance of the lungs with bilateral areas of peribronchial scarring/atelectasis. Osseous structures are without acute abnormality. Soft tissues are grossly normal. IMPRESSION: 1. Endotracheal tube in satisfactory position. 2. Enteric catheter with side hole at the expected location of the GE junction. Advancement with may be considered. 3. Stable abnormal appearance of the lungs with peribronchial areas of scarring/atelectasis and chronic elevation of the left hemidiaphragm. Electronically Signed   By: Fidela Salisbury M.D.   On: 01/02/2020 13:57     Medications:   . sodium chloride    . famotidine (PEPCID) IV  20 mg (01/04/20 0832)  . fentaNYL infusion INTRAVENOUS 300 mcg/hr (01/04/20 0716)  . lactated ringers    . levofloxacin (LEVAQUIN) IV     . metoprolol tartrate      . budesonide (PULMICORT) nebulizer solution  0.5 mg Nebulization BID  . chlorhexidine gluconate (MEDLINE KIT)  15 mL Mouth Rinse BID  . Chlorhexidine Gluconate Cloth  6 each Topical Q0600  . docusate  100 mg Per Tube BID  . enoxaparin (LOVENOX) injection  60 mg Subcutaneous Q24H  . fentaNYL  150 mcg Intravenous Once  . fentaNYL (SUBLIMAZE) injection  50 mcg Intravenous Once  . insulin aspart  0-20 Units Subcutaneous TID WC  . ipratropium-albuterol  3 mL Nebulization BID  . mouth rinse  15 mL Mouth Rinse 10 times per day  . methylPREDNISolone (SOLU-MEDROL) injection  40 mg Intravenous Q12H  . polyethylene glycol  17 g Per  Tube Daily   acetaminophen **OR** acetaminophen, fentaNYL, fentaNYL, metoprolol tartrate, midazolam, ondansetron **OR** ondansetron (ZOFRAN) IV  Assessment/ Plan:  49 y.o. male with     admitted on 01/01/2020 for Shortness of breath [R06.02] Hypercapnia [R06.89] COPD exacerbation (HCC) [J44.1] Acute respiratory failure with hypoxia and hypercapnia (HCC) [J96.01, J96.02] Acute and chr resp failure, unsp w hypoxia or hypercapnia ([place]) [J96.20]   #. Acute kidney injury on chronic kidney disease stage IIIA with proteinuria: baseline creatinine of 1.41, GFR of 58 on 08/01/19.  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  Acute kidney injury secondary to ATN from hypotension and hypoxia Nonoliguric urine output. No IV contrast exposure.  Lab Results  Component Value Date   CREATININE 2.42 (H) 01/04/2020   CREATININE 2.59 (H) 01/03/2020   CREATININE 1.55 (H) 01/02/2020     - Renal ultrasound- unremarkable no obstruction - hold lisinopril and furosemide - continue maintenance fluid for another day - No indication for dialysis. Continue to monitor urine output,  renal function, serum electrolytes and volume status.   #.   Acute respiratory failure: requiring intubation and mechanical ventilation.  Acute exacerbation of COPD and sarcoidosis. 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   #. Diabetes mellitus type II with chronic kidney disease: insulin dependent.  Lab Results  Component Value Date   HGBA1C 6.5 (H) 01/02/2020       LOS: 2 Jaycelynn Knickerbocker Candiss Norse 11/29/202111:01 AM  [place], [place]  Note: This note was prepared with Dragon dictation. Any transcription errors are unintentional

## 2020-01-04 NOTE — Progress Notes (Signed)
Initial Nutrition Assessment  DOCUMENTATION CODES:   Obesity unspecified  INTERVENTION:  Once enteral access placed and confirmed recommend: -Vital High Protein at 50 mL/hr (1200 mL goal daily volume) + PROSource TF 90 mL TID per tube -Provides 1440 kcal, 171 grams of protein, 1008 mL H2O daily -MVI daily per tube  NUTRITION DIAGNOSIS:   Inadequate oral intake related to inability to eat as evidenced by NPO status.  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   11/27 intubated 11/29 extubated  Patient is currently intubated on ventilator support MV: 9.6 L/min Temp (24hrs), Avg:97.9 F (36.6 C), Min:97.5 F (36.4 C), Max:98.4 F (36.9 C)  Medications reviewed and include: Colace 100 mg BID, Novolog 0-20 units TID, Solu-Medrol 40 mg Q12hrs IV, Miralax, famotidine, fentanyl gtt, Levaquin.  Labs reviewed: CBG 140-179, CO2 36, BUN 55, Creatinine 2.42, Magnesium 2.5.  I/O: 875 mL UOP Yesterday (0.3 mL/kg/hr)  Enteral Access: pending replacement of enteral access  Discussed with RN and in rounds this AM. Patient was extubated this AM. He had to be re-intubated later this AM. RD unable to complete NFPE as patient was being re-intubated.   NUTRITION - FOCUSED PHYSICAL EXAM:  Unable to complete at this time.  Diet Order:   Diet Order            Diet NPO time specified  Diet effective now                EDUCATION NEEDS:   No education needs have been identified at this time  Skin:  Skin Assessment:  Reviewed RN Assessment  Last BM:  Unknown  Height:   Ht Readings from Last 1 Encounters:  01/02/20 6\' 1"  (1.854 m)   Weight:   Wt Readings from Last 1 Encounters:  01/02/20 121.9 kg   Ideal Body Weight:  83.6 kg  BMI:  Body mass index is 35.46 kg/m.  Estimated Nutritional Needs:   Kcal:  1341-1707 (11-14 kcal/kg)  Protein:  167 grams (2 grams/kg IBW)  Fluid:  2 L/day  Jacklynn Barnacle, MS, RD, LDN Pager number available on Amion

## 2020-01-05 LAB — CBC
HCT: 41.3 % (ref 39.0–52.0)
Hemoglobin: 13.1 g/dL (ref 13.0–17.0)
MCH: 28.5 pg (ref 26.0–34.0)
MCHC: 31.7 g/dL (ref 30.0–36.0)
MCV: 90 fL (ref 80.0–100.0)
Platelets: 199 10*3/uL (ref 150–400)
RBC: 4.59 MIL/uL (ref 4.22–5.81)
RDW: 14 % (ref 11.5–15.5)
WBC: 7.6 10*3/uL (ref 4.0–10.5)
nRBC: 0 % (ref 0.0–0.2)

## 2020-01-05 LAB — GLUCOSE, CAPILLARY
Glucose-Capillary: 100 mg/dL — ABNORMAL HIGH (ref 70–99)
Glucose-Capillary: 99 mg/dL (ref 70–99)
Glucose-Capillary: 105 mg/dL — ABNORMAL HIGH (ref 70–99)
Glucose-Capillary: 138 mg/dL — ABNORMAL HIGH (ref 70–99)
Glucose-Capillary: 112 mg/dL — ABNORMAL HIGH (ref 70–99)

## 2020-01-05 LAB — KAPPA/LAMBDA LIGHT CHAINS
Kappa free light chain: 40.2 mg/L — ABNORMAL HIGH (ref 3.3–19.4)
Kappa, lambda light chain ratio: 1.85 — ABNORMAL HIGH (ref 0.26–1.65)
Lambda free light chains: 21.7 mg/L (ref 5.7–26.3)

## 2020-01-05 LAB — PROTEIN ELECTROPHORESIS, SERUM
A/G Ratio: 1.1 (ref 0.7–1.7)
Albumin ELP: 3.1 g/dL (ref 2.9–4.4)
Alpha-1-Globulin: 0.2 g/dL (ref 0.0–0.4)
Alpha-2-Globulin: 0.7 g/dL (ref 0.4–1.0)
Beta Globulin: 0.8 g/dL (ref 0.7–1.3)
Gamma Globulin: 1.1 g/dL (ref 0.4–1.8)
Globulin, Total: 2.9 g/dL (ref 2.2–3.9)
Total Protein ELP: 6 g/dL (ref 6.0–8.5)

## 2020-01-05 LAB — PHOSPHORUS: Phosphorus: 2.2 mg/dL — ABNORMAL LOW (ref 2.5–4.6)

## 2020-01-05 LAB — CULTURE, RESPIRATORY W GRAM STAIN
Culture: NORMAL
Gram Stain: NONE SEEN

## 2020-01-05 LAB — PROTEIN ELECTRO, RANDOM URINE
Albumin ELP, Urine: 57.6 %
Alpha-1-Globulin, U: 2.8 %
Alpha-2-Globulin, U: 7.2 %
Beta Globulin, U: 18.6 %
Gamma Globulin, U: 13.7 %
Total Protein, Urine: 37.7 mg/dL

## 2020-01-05 LAB — BASIC METABOLIC PANEL
Anion gap: 9 (ref 5–15)
BUN: 57 mg/dL — ABNORMAL HIGH (ref 6–20)
CO2: 33 mmol/L — ABNORMAL HIGH (ref 22–32)
Calcium: 8.3 mg/dL — ABNORMAL LOW (ref 8.9–10.3)
Chloride: 100 mmol/L (ref 98–111)
Creatinine, Ser: 2.25 mg/dL — ABNORMAL HIGH (ref 0.61–1.24)
GFR, Estimated: 35 mL/min — ABNORMAL LOW (ref >60)
Glucose, Bld: 91 mg/dL (ref 70–99)
Potassium: 4.2 mmol/L (ref 3.5–5.1)
Sodium: 142 mmol/L (ref 135–145)

## 2020-01-05 LAB — MAGNESIUM: Magnesium: 2.6 mg/dL — ABNORMAL HIGH (ref 1.7–2.4)

## 2020-01-05 MED ORDER — ADULT MULTIVITAMIN W/MINERALS CH
1.0000 | ORAL_TABLET | Freq: Every day | ORAL | Status: DC
  Administered 2020-01-05 – 2020-01-25 (×20): 1
  Filled 2020-01-05 (×20): qty 1

## 2020-01-05 MED ORDER — LEVOFLOXACIN IN D5W 750 MG/150ML IV SOLN
750.0000 mg | INTRAVENOUS | Status: AC
  Administered 2020-01-05 – 2020-01-08 (×4): 750 mg via INTRAVENOUS
  Filled 2020-01-05 (×4): qty 150

## 2020-01-05 MED ORDER — VITAL HIGH PROTEIN PO LIQD
1000.0000 mL | ORAL | Status: DC
  Administered 2020-01-05 – 2020-01-11 (×7): 1000 mL

## 2020-01-05 MED ORDER — POTASSIUM & SODIUM PHOSPHATES 280-160-250 MG PO PACK
1.0000 | PACK | Freq: Three times a day (TID) | ORAL | Status: AC
  Administered 2020-01-05 (×2): 1
  Filled 2020-01-05 (×2): qty 1

## 2020-01-05 MED ORDER — IPRATROPIUM-ALBUTEROL 0.5-2.5 (3) MG/3ML IN SOLN
3.0000 mL | RESPIRATORY_TRACT | Status: DC
  Administered 2020-01-05 – 2020-01-07 (×10): 3 mL via RESPIRATORY_TRACT
  Filled 2020-01-05 (×8): qty 3

## 2020-01-05 MED ORDER — PROSOURCE TF PO LIQD
90.0000 mL | Freq: Four times a day (QID) | ORAL | Status: DC
  Administered 2020-01-05 – 2020-01-11 (×23): 90 mL
  Filled 2020-01-05 (×27): qty 90

## 2020-01-05 MED ORDER — ACETYLCYSTEINE 20 % IN SOLN
4.0000 mL | Freq: Two times a day (BID) | RESPIRATORY_TRACT | Status: DC
  Administered 2020-01-05 – 2020-01-22 (×34): 4 mL via RESPIRATORY_TRACT
  Filled 2020-01-05 (×37): qty 4

## 2020-01-05 MED ORDER — ALBUTEROL SULFATE (2.5 MG/3ML) 0.083% IN NEBU
2.5000 mg | INHALATION_SOLUTION | RESPIRATORY_TRACT | Status: DC | PRN
  Administered 2020-01-07: 2.5 mg via RESPIRATORY_TRACT
  Filled 2020-01-05: qty 3

## 2020-01-05 NOTE — Progress Notes (Signed)
PHARMACY NOTE:  ANTIMICROBIAL RENAL DOSAGE ADJUSTMENT  Current antimicrobial regimen includes a mismatch between antimicrobial dosage and estimated renal function.  As per policy approved by the Pharmacy & Therapeutics and Medical Executive Committees, the antimicrobial dosage will be adjusted accordingly.  Current antimicrobial dosage:  Levofloxacin 750 mg IV q48h  Indication: Pneumonia  Renal Function:  Estimated Creatinine Clearance: 54.3 mL/min (A) (by C-G formula based on SCr of 2.25 mg/dL (H)).    Antimicrobial dosage has been changed to:  Levofloxacin 750 mg IV q24h  Additional comments: Scr down-trending. UOP improving.   Thank you for allowing pharmacy to be a part of this patient's care.  Benita Gutter, [facility] 01/05/2020 12:25 PM

## 2020-01-05 NOTE — Progress Notes (Signed)
CRITICAL CARE NOTE 49 y.o.malewith medical history significant ofchronic combined systolic and diastolic CHF, COPD, CAD, history of MI, type 2 diabetes, hyperlipidemia, hypertension, sarcoidosis, sleep apnea not using CPAP regularly,  history of other nonhemorrhagic CVA who is brought to the emergency department with complaints of chest tightness, dyspnea associated with clear sputum productive cough that started about 2 to 3 weeks ago and has become progressively worse. Previous h/o [place]  SIGNIFICANT EVENTS 11/27 ICU admission, INTUBATED #[id number] 11/28 severe resp failure, progressive Renal failure, Nephrology consulted 11/29 - patient passed SBT today, able to communicate/follow directions while intubated status post extubation to BIPAP - after 1 hour on BIPAP patient became unresponsive and required re-intubation. I have met with mother in law at bedside and reviewed careplan.  11/30- patient remains on ventilator.  He has thickened mucopurulent tracheal secretions, he can follow verbal instructions but failed SBT today. GPC+ tracheal aspirate culture    CC  follow up respiratory failure  SUBJECTIVE Patient remains critically ill Prognosis is guarded   BP 113/71   Pulse 63   Temp 99.7 F (37.6 C)   Resp (!) 28   Ht 6' 1" (1.854 m)   Wt 121.9 kg   SpO2 98%   BMI 35.46 kg/m    I/O last 3 completed shifts: In: 1996.4 [I.V.:1886.4; NG/GT:60; IV Piggyback:50] Out: 2400 [Urine:1775; Emesis/NG output:625] Total I/O In: -  Out: 350 [Urine:350]  SpO2: 98 % O2 Flow Rate (L/min): 3 L/min FiO2 (%): 30 %  Estimated body mass index is 35.46 kg/m as calculated from the following:   Height as of this encounter: 6' 1" (1.854 m).   Weight as of this encounter: 121.9 kg.    REVIEW OF SYSTEMS  PATIENT IS UNABLE TO PROVIDE COMPLETE REVIEW OF SYSTEMS DUE TO SEVERE CRITICAL ILLNESS        PHYSICAL EXAMINATION:  GENERAL:obese age appropriate HEAD: Normocephalic,  atraumatic.  EYES: Pupils equal, round, reactive to light.  No scleral icterus.  MOUTH: Moist mucosal membrane. NECK: Supple.  PULMONARY: +rhonchi, +wheezing CARDIOVASCULAR: S1 and S2. Regular rate and rhythm. No murmurs, rubs, or gallops.  GASTROINTESTINAL: Soft, nontender, -distended.  Positive bowel sounds.   MUSCULOSKELETAL: + edema.  NEUROLOGIC: obtunded, GCS<8T SKIN:intact,warm,dry  MEDICATIONS: I have reviewed all medications and confirmed regimen as documented   CULTURE RESULTS   Recent Results (from the past 240 hour(s))  Resp Panel by RT-PCR (Flu A&B, Covid) Nasopharyngeal Swab     Status: None   Collection Time: 01/01/20  3:13 PM   Specimen: Nasopharyngeal Swab; Nasopharyngeal(NP) swabs in vial transport medium  Result Value Ref Range Status   SARS Coronavirus 2 by RT PCR NEGATIVE NEGATIVE Final    Comment: (NOTE) SARS-CoV-2 target nucleic acids are NOT DETECTED.  The SARS-CoV-2 RNA is generally detectable in upper respiratory specimens during the acute phase of infection. The lowest concentration of SARS-CoV-2 viral copies this assay can detect is 138 copies/mL. A negative result does not preclude SARS-Cov-2 infection and should not be used as the sole basis for treatment or other patient management decisions. A negative result may occur with  improper specimen collection/handling, submission of specimen other than nasopharyngeal swab, presence of viral mutation(s) within the areas targeted by this assay, and inadequate number of viral copies(<138 copies/mL). A negative result must be combined with clinical observations, patient history, and epidemiological information. The expected result is Negative.  Fact Sheet for Patients:  EntrepreneurPulse.com.au  Fact Sheet for Healthcare Providers:  IncredibleEmployment.be  This test  is no t yet approved or cleared by the [place] and  has been authorized for detection  and/or diagnosis of SARS-CoV-2 by FDA under an Emergency Use Authorization (EUA). This EUA will remain  in effect (meaning this test can be used) for the duration of the COVID-19 declaration under Section 564(b)(1) of the Act, 21 U.S.C.section 360bbb-3(b)(1), unless the authorization is terminated  or revoked sooner.       Influenza A by PCR NEGATIVE NEGATIVE Final   Influenza B by PCR NEGATIVE NEGATIVE Final    Comment: (NOTE) The Xpert Xpress SARS-CoV-2/FLU/RSV plus assay is intended as an aid in the diagnosis of influenza from Nasopharyngeal swab specimens and should not be used as a sole basis for treatment. Nasal washings and aspirates are unacceptable for Xpert Xpress SARS-CoV-2/FLU/RSV testing.  Fact Sheet for Patients: EntrepreneurPulse.com.au  Fact Sheet for Healthcare Providers: IncredibleEmployment.be  This test is not yet approved or cleared by the [place] FDA and has been authorized for detection and/or diagnosis of SARS-CoV-2 by FDA under an Emergency Use Authorization (EUA). This EUA will remain in effect (meaning this test can be used) for the duration of the COVID-19 declaration under Section 564(b)(1) of the Act, 21 U.S.C. section 360bbb-3(b)(1), unless the authorization is terminated or revoked.  Performed at [facility], [address] [place], [place] [address]   MRSA PCR Screening     Status: None   Collection Time: 01/02/20  2:45 PM   Specimen: Nasopharyngeal  Result Value Ref Range Status   MRSA by PCR NEGATIVE NEGATIVE Final    Comment:        The GeneXpert MRSA Assay (FDA approved for NASAL specimens only), is one component of a comprehensive MRSA colonization surveillance program. It is not intended to diagnose MRSA infection nor to guide or monitor treatment for MRSA infections. Performed at [facility], [address] [place], [place] [address]   Culture, respiratory      Status: None   Collection Time: 01/02/20  5:00 PM   Specimen: INDUCED SPUTUM  Result Value Ref Range Status   Specimen Description   Final    INDUCED SPUTUM Performed at [facility], [address] [place], [place] [address]    Special Requests   Final    NONE Performed at [facility], [address] [place], [place] [address]    Gram Stain   Final    NO WBC SEEN RARE GRAM POSITIVE COCCI IN PAIRS IN CHAINS    Culture   Final    FEW Normal respiratory flora-no Staph aureus or Pseudomonas seen Performed at [facility], [address] [place], [place] [address]    Report Status 01/05/2020 FINAL  Final  Culture, respiratory     Status: None (Preliminary result)   Collection Time: 01/04/20 11:14 AM   Specimen: Tracheal Aspirate; Respiratory  Result Value Ref Range Status   Specimen Description   Final    TRACHEAL ASPIRATE Performed at [facility], [address] [place], [place] [address]    Special Requests   Final    NONE Performed at [facility], [address] [place], [place] [address]    Gram Stain   Final    FEW WBC PRESENT, PREDOMINANTLY PMN NO ORGANISMS SEEN    Culture   Final    NO GROWTH < 24 HOURS Performed at [place] [address], [place], [place] [address]    Report  Status PENDING  Incomplete          IMAGING    No results found.   Nutrition Status: Nutrition Problem: Inadequate oral intake Etiology: inability to eat Signs/Symptoms: NPO status Interventions: Tube feeding, Prostat, MVI     Indwelling Urinary Catheter continued, requirement due to   Reason to continue Indwelling Urinary Catheter strict Intake/Output monitoring for hemodynamic instability         Ventilator continued, requirement due to severe respiratory failure   Ventilator Sedation RASS 0 to -2      ASSESSMENT AND PLAN  49 yo AAM with severe morbid obesity with severe biventricular heart failure  with acute and severe resp failue from acute COPD exacerbation with acute CAP with SEPSIS PRESENT ON ADMISSION with severe metabolic encephalopathy from hypoxia and hypercapnia with progressive renal failure  Severe ACUTE Hypoxic and Hypercapnic Respiratory Failure -continue Full MV support -continue Bronchodilator Therapy -Wean Fio2 and PEEP as tolerated -will perform SAT/SBT when respiratory parameters are met -VAP/VENT bundle implementation           Tracheal aspirate - 11/29-  On levofloxacin at this time         ABG reviewed - 11/29 - ventilator management performed -discussed with RT   ACUTE BIVENTRICULAR  CARDIAC FAILURE-  -oxygen as needed -Lasix as tolerated -follow up cardiology recs   Morbid obesity, possible OSA.   Will certainly impact respiratory mechanics, ventilator weaning Suspect will need to consider additional PEEP    ACUTE KIDNEY INJURY/Renal Failure -continue Foley Catheter-assess need -Avoid nephrotoxic agents -Follow urine output, BMP -Ensure adequate renal perfusion, optimize oxygenation -Renal dose medications Nephrology consulted     NEUROLOGY Acute toxic metabolic encephalopathy, need for sedation Goal RASS -2 to -3    CARDIAC ICU monitoring  ID abx for CAP -continue IV abx as prescibed -follow up cultures  GI GI PROPHYLAXIS as indicated  NUTRITIONAL STATUS Nutrition Status: Nutrition Problem: Inadequate oral intake Etiology: inability to eat DIET-->TF's as tolerated Constipation protocol as indicated  ENDO - will use ICU hypoglycemic\Hyperglycemia protocol if indicated     ELECTROLYTES -follow labs as needed -replace as needed -pharmacy consultation and following   DVT/GI PRX ordered and assessed TRANSFUSIONS AS NEEDED MONITOR FSBS I Assessed the need for Labs I Assessed the need for Foley I Assessed the need for Central Venous Line Family Discussion when available I Assessed the need for Mobilization I made an  Assessment of medications to be adjusted accordingly Safety Risk assessment completed   CASE DISCUSSED IN MULTIDISCIPLINARY ROUNDS WITH ICU TEAM  Critical Care Time devoted to patient care services described in this note is 34  minutes.   Overall, patient is critically ill, prognosis is guarded/poor.  Patient with Multiorgan failure and at high risk for cardiac arrest and death.      Ottie Glazier, M.D.  [facility] [place]

## 2020-01-05 NOTE — Progress Notes (Signed)
Nutrition Follow-up  DOCUMENTATION CODES:   Obesity unspecified  INTERVENTION:  Initiate Vital High Protein at 40 mL/hr (960 mL goal daily volume) + PROSource TF 90 mL QID per tube. Provides 1280 kcal, 172 grams of protein, 806 mL H2O daily. With current propofol rate provides 1665 kcal daily.  Provide MVI daily per tube.  NUTRITION DIAGNOSIS:   Inadequate oral intake related to inability to eat as evidenced by NPO status.  Ongoing.  GOAL:   Patient will meet greater than or equal to 90% of their needs  Progressing with initiation of tube feeds.  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  11/27 intubated 11/29 extubated then later re-intubated  Patient is currently intubated on ventilator support MV: 13.6 L/min Temp (24hrs), Avg:98.6 F (37 C), Min:96.8 F (36 C), Max:99.7 F (37.6 C)  Propofol: 14.6 ml/hr (385 kcal daily)  Medications reviewed and include: Colace 100 mg BID, Novolog 0-20 units TID, Miralax, potassium and sodium phosphates 1 packet BID today, famotidine, fentanyl gtt, propofol gtt.  Labs reviewed: CBG 93-105, BUN 57, Creatinine 2.25, Phosphorus 2.2, Magnesium 2.6.  I/O: 1500 mL UOP yesterday (0.5 mL/kg/hr)  No weight to trend since 11/27  Enteral Access: OGT placed 11/29; terminates in proximal stomach per abdominal x-ray 11/29  Discussed with RN and on rounds. Plan is to start tube feeds today.   NUTRITION - FOCUSED PHYSICAL EXAM:    Most Recent Value  Orbital Region No depletion  Upper Arm Region No depletion  Thoracic and Lumbar Region No  depletion  Buccal Region Unable to assess  Temple Region No depletion  Clavicle Bone Region No depletion  Clavicle and Acromion Bone Region No depletion  Scapular Bone Region Unable to assess  Dorsal Hand No depletion  Patellar Region No depletion  Anterior Thigh Region No depletion  Posterior Calf Region No depletion  Edema (RD Assessment) Mild  Hair Reviewed  Eyes Unable to assess  Mouth Unable to assess  Skin Reviewed  Nails Reviewed     Diet Order:   Diet Order            Diet NPO time specified  Diet effective now                EDUCATION NEEDS:   No education needs have been identified at this time  Skin:  Skin Assessment: Reviewed RN Assessment  Last BM:  Unknown  Height:   Ht Readings from Last 1 Encounters:  01/02/20 6\' 1"  (1.854 m)   Weight:   Wt Readings from Last 1 Encounters:  01/02/20 121.9 kg   Ideal Body Weight:  83.6 kg  BMI:  Body mass index is 35.46 kg/m.  Estimated Nutritional Needs:   Kcal:  1341-1707 (11-14 kcal/kg)  Protein:  167 grams (2 grams/kg IBW)  Fluid:  2 L/day  Jacklynn Barnacle, MS, RD, LDN Pager number available on Amion

## 2020-01-05 NOTE — Progress Notes (Signed)
PSV wean attempted, pt became agitated, asynchronized with vent and pulling at tubes. Pt placed back on resting settings

## 2020-01-05 NOTE — Progress Notes (Signed)
[facility], [place] 01/05/20  Subjective:   Hospital day # 3 Remains intubated  cvs: tachycardic Pulm: vent dependent Following simple commands  Renal: 11/29 0701 - 11/30 0700 In: 423.6 [I.V.:373.6; IV Piggyback:50] Out: 1500 [Urine:1500] Lab Results  Component Value Date   CREATININE 2.25 (H) 01/05/2020   CREATININE 2.42 (H) 01/04/2020   CREATININE 2.59 (H) 01/03/2020     Objective:  Vital signs in last 24 hours:  Temp:  [97.9 F (36.6 C)-99.7 F (37.6 C)] 99.5 F (37.5 C) (11/30 1730) Pulse Rate:  [55-71] 59 (11/30 1730) Resp:  [18-32] 28 (11/30 1730) BP: (86-115)/(53-74) 113/71 (11/30 1730) SpO2:  [71 %-100 %] 98 % (11/30 1730) FiO2 (%):  [30 %-40 %] 30 % (11/30 1653)  Weight change:  Filed Weights   01/01/20 1114 01/02/20 1439  Weight: 123.9 kg 121.9 kg    Intake/Output:    Intake/Output Summary (Last 24 hours) at 01/05/2020 1751 Last data filed at 01/05/2020 1728 Gross per 24 hour  Intake --  Output 1650 ml  Net -1650 ml     Physical Exam: General: Critically ill appearing  HEENT ETT in place  Pulm/lungs Vent assisted  CVS/Heart tachycardic  Abdomen:  soft  Extremities: + edema  Neurologic: sedated  Skin: warm          Basic Metabolic Panel:  Recent Labs  Lab 01/01/20 1117 01/01/20 1117 01/02/20 0421 01/02/20 0421 01/03/20 0455 01/04/20 0440 01/05/20 0459  NA 143  --  146*  --  145 143 142  K 4.3  --  5.7*  --  4.3 4.5 4.2  CL 94*  --  99  --  98 100 100  CO2 40*  --  40*  --  36* 36* 33*  GLUCOSE 239*  --  173*  --  161* 148* 91  BUN 20  --  23*  --  47* 55* 57*  CREATININE 1.61*  --  1.55*  --  2.59* 2.42* 2.25*  CALCIUM 9.1   < > 8.5*   < > 8.5* 8.1* 8.3*  MG  --   --   --   --  2.2 2.5* 2.6*  PHOS  --   --   --   --  1.4* 4.1 2.2*   < > = values in this interval not displayed.     CBC: Recent Labs  Lab 01/01/20 1117 01/04/20 0440 01/05/20 0459  WBC 8.6 12.9* 7.6  HGB 15.4 13.6 13.1   HCT 50.3 44.5 41.3  MCV 94.0 92.9 90.0  PLT 208 197 199      Lab Results  Component Value Date   HEPBSAG NON REACTIVE 01/03/2020   HEPBSAB NON REACTIVE 01/03/2020   HEPBIGM NON REACTIVE 01/03/2020      Microbiology:  Recent Results (from the past 240 hour(s))  Resp Panel by RT-PCR (Flu A&B, Covid) Nasopharyngeal Swab     Status: None   Collection Time: 01/01/20  3:13 PM   Specimen: Nasopharyngeal Swab; Nasopharyngeal(NP) swabs in vial transport medium  Result Value Ref Range Status   SARS Coronavirus 2 by RT PCR NEGATIVE NEGATIVE Final    Comment: (NOTE) SARS-CoV-2 target nucleic acids are NOT DETECTED.  The SARS-CoV-2 RNA is generally detectable in upper respiratory specimens during the acute phase of infection. The lowest concentration of SARS-CoV-2 viral copies this assay can detect is 138 copies/mL. A negative result does not preclude SARS-Cov-2 infection and should not be used as the sole basis for treatment  or other patient management decisions. A negative result may occur with  improper specimen collection/handling, submission of specimen other than nasopharyngeal swab, presence of viral mutation(s) within the areas targeted by this assay, and inadequate number of viral copies(<138 copies/mL). A negative result must be combined with clinical observations, patient history, and epidemiological information. The expected result is Negative.  Fact Sheet for Patients:  EntrepreneurPulse.com.au  Fact Sheet for Healthcare Providers:  IncredibleEmployment.be  This test is no t yet approved or cleared by the [place] FDA and  has been authorized for detection and/or diagnosis of SARS-CoV-2 by FDA under an Emergency Use Authorization (EUA). This EUA will remain  in effect (meaning this test can be used) for the duration of the COVID-19 declaration under Section 564(b)(1) of the Act, 21 U.S.C.section 360bbb-3(b)(1), unless the  authorization is terminated  or revoked sooner.       Influenza A by PCR NEGATIVE NEGATIVE Final   Influenza B by PCR NEGATIVE NEGATIVE Final    Comment: (NOTE) The Xpert Xpress SARS-CoV-2/FLU/RSV plus assay is intended as an aid in the diagnosis of influenza from Nasopharyngeal swab specimens and should not be used as a sole basis for treatment. Nasal washings and aspirates are unacceptable for Xpert Xpress SARS-CoV-2/FLU/RSV testing.  Fact Sheet for Patients: EntrepreneurPulse.com.au  Fact Sheet for Healthcare Providers: IncredibleEmployment.be  This test is not yet approved or cleared by the [place] FDA and has been authorized for detection and/or diagnosis of SARS-CoV-2 by FDA under an Emergency Use Authorization (EUA). This EUA will remain in effect (meaning this test can be used) for the duration of the COVID-19 declaration under Section 564(b)(1) of the Act, 21 U.S.C. section 360bbb-3(b)(1), unless the authorization is terminated or revoked.  Performed at [facility], [address] [place], [place] [address]   MRSA PCR Screening     Status: None   Collection Time: 01/02/20  2:45 PM   Specimen: Nasopharyngeal  Result Value Ref Range Status   MRSA by PCR NEGATIVE NEGATIVE Final    Comment:        The GeneXpert MRSA Assay (FDA approved for NASAL specimens only), is one component of a comprehensive MRSA colonization surveillance program. It is not intended to diagnose MRSA infection nor to guide or monitor treatment for MRSA infections. Performed at [facility], [address] [place], [place] [address]   Culture, respiratory     Status: None   Collection Time: 01/02/20  5:00 PM   Specimen: INDUCED SPUTUM  Result Value Ref Range Status   Specimen Description   Final    INDUCED SPUTUM Performed at [facility], [address] [place], [place] [address]    Special Requests   Final     NONE Performed at [facility], [address] [place], [place] [address]    Gram Stain   Final    NO WBC SEEN RARE GRAM POSITIVE COCCI IN PAIRS IN CHAINS    Culture   Final    FEW Normal respiratory flora-no Staph aureus or Pseudomonas seen Performed at [facility], [address], [place], [place] [address]    Report Status 01/05/2020 FINAL  Final  Culture, respiratory     Status: None (Preliminary result)   Collection Time: 01/04/20 11:14 AM   Specimen: Tracheal Aspirate; Respiratory  Result Value Ref Range Status   Specimen Description   Final    TRACHEAL ASPIRATE Performed at [facility], [place],  [place] [address]    Special Requests   Final    NONE Performed at [facility], [place], [place] [address]    Gram Stain   Final    FEW WBC PRESENT, PREDOMINANTLY PMN NO ORGANISMS SEEN    Culture   Final    NO GROWTH < 24 HOURS Performed at [facility] [address] [place],  [address]    Report Status PENDING  Incomplete    Coagulation Studies: No results for input(s): LABPROT, INR in the last 72 hours.  Urinalysis: No results for input(s): COLORURINE, LABSPEC, PHURINE, GLUCOSEU, HGBUR, BILIRUBINUR, KETONESUR, PROTEINUR, UROBILINOGEN, NITRITE, LEUKOCYTESUR in the last 72 hours.  Invalid input(s): APPERANCEUR    Imaging: DG Abd 1 View  Result Date: 01/04/2020 CLINICAL DATA:  Status post orogastric tube placement. EXAM: ABDOMEN - 1 VIEW COMPARISON:  February 11, 2015. FINDINGS: The bowel gas pattern is normal. Distal tip of enteric tube is seen in proximal stomach. No radio-opaque calculi or other significant radiographic abnormality are seen. IMPRESSION: Distal tip of enteric tube seen in proximal stomach. Electronically Signed   By: Marijo Conception M.D.   On: 01/04/2020 11:39   DG Chest Port 1 View  Result Date: 01/04/2020 CLINICAL DATA:  Status post intubation. EXAM: PORTABLE  CHEST 1 VIEW COMPARISON:  Same day. FINDINGS: The heart size and mediastinal contours are within normal limits. Endotracheal and nasogastric tubes are unchanged in position. No pneumothorax is noted. Stable bilateral lung opacities are noted most consistent with scarring, with probable small left pleural effusion. The visualized skeletal structures are unremarkable. IMPRESSION: Stable support apparatus. Stable bilateral lung opacities are noted most consistent with scarring, with probable small left pleural effusion. Electronically Signed   By: Marijo Conception M.D.   On: 01/04/2020 11:38   DG Chest Port 1 View  Result Date: 01/04/2020 CLINICAL DATA:  Respiratory failure EXAM: PORTABLE CHEST 1 VIEW COMPARISON:  01/03/2020 FINDINGS: Endotracheal tube is seen 5.1 cm above the carina. Nasogastric tube extends into the upper abdomen beyond the margin of the examination. Linear scarring within the right mid lung zone and left lung base are again identified. Mild asymmetric left-sided volume loss is unchanged. Tiny left pleural effusion or pleural scarring is seen at the left costophrenic angle. No superimposed confluent pulmonary infiltrate. No pneumothorax. Cardiac size within normal limits. Pulmonary vascularity is normal. IMPRESSION: Stable support tubes. Unchanged examination with multifocal regions of parenchymal scarring and tiny left pleural effusion or pleural scarring. Electronically Signed   By: Fidela Salisbury MD   On: 01/04/2020 04:55     Medications:   . sodium chloride    . sodium chloride    . famotidine (PEPCID) IV 20 mg (01/05/20 0928)  . fentaNYL infusion INTRAVENOUS 200 mcg/hr (01/05/20 0817)  . levofloxacin (LEVAQUIN) IV 750 mg (01/05/20 1728)  . propofol (DIPRIVAN) infusion 25 mcg/kg/min (01/05/20 1416)   . acetylcysteine  4 mL Nebulization BID  . budesonide (PULMICORT) nebulizer solution  0.5 mg Nebulization BID  . chlorhexidine gluconate (MEDLINE KIT)  15 mL Mouth Rinse BID  .  Chlorhexidine Gluconate Cloth  6 each Topical Q0600  . docusate  100 mg Per Tube BID  . enoxaparin (LOVENOX) injection  125 mg Subcutaneous Q12H  . feeding supplement (PROSource TF)  90 mL Per Tube QID  . feeding supplement (VITAL HIGH PROTEIN)  1,000 mL Per Tube Q24H  . insulin aspart  0-20 Units Subcutaneous TID WC  . ipratropium-albuterol  3 mL Nebulization Q4H  . mouth rinse  15 mL Mouth Rinse 10 times per day  . multivitamin with minerals  1 tablet Per Tube Daily  . polyethylene glycol  17 g Per Tube Daily   acetaminophen **OR** acetaminophen, albuterol, fentaNYL, metoprolol tartrate, midazolam, ondansetron **OR** ondansetron (ZOFRAN) IV  Assessment/ Plan:  49 y.o. male with     admitted on 01/01/2020 for Shortness of breath [R06.02] Hypercapnia [R06.89] COPD exacerbation (HCC) [J44.1] Acute respiratory failure with hypoxia and hypercapnia (HCC) [J96.01, J96.02] Acute and chr resp failure, unsp w hypoxia or hypercapnia ([place]) [J96.20]   #. Acute kidney injury on chronic kidney disease stage IIIA with proteinuria: baseline creatinine of 1.41, GFR of 58 on 08/01/19.  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  Acute kidney injury secondary to ATN from hypotension and hypoxia Nonoliguric urine output. No IV contrast exposure.  Lab Results  Component Value Date   CREATININE 2.25 (H) 01/05/2020   CREATININE 2.42 (H) 01/04/2020   CREATININE 2.59 (H) 01/03/2020   - Renal ultrasound- unremarkable no obstruction - hold lisinopril and furosemide - No indication for dialysis. Continue to monitor urine output, renal function, serum electrolytes and volume status.   #.   Acute respiratory failure: requiring intubation and mechanical ventilation.  Acute exacerbation of COPD and sarcoidosis. 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   #. Diabetes mellitus type II with chronic kidney disease: insulin dependent.  Lab Results   Component Value Date   HGBA1C 6.5 (H) 01/02/2020       LOS: [place] 11/30/20215:51 PM  [place], [place]  Note: This note was prepared with Dragon dictation. Any transcription errors are unintentional

## 2020-01-05 NOTE — Progress Notes (Signed)
Patient agitated, alarming vent. Dr. Delilah Shan notified unable to start precedex due to heart rate 50-60's.

## 2020-01-05 NOTE — Consult Note (Signed)
PHARMACY CONSULT NOTE  Pharmacy Consult for Electrolyte Monitoring and Replacement   Recent Labs: Potassium (mmol/L)  Date Value  01/05/2020 4.2  05/28/2014 3.9   Magnesium (mg/dL)  Date Value  01/05/2020 2.6 (H)  05/14/2013 1.8   Calcium (mg/dL)  Date Value  01/05/2020 8.3 (L)   Calcium, Total (mg/dL)  Date Value  05/28/2014 8.4 (L)   Albumin (g/dL)  Date Value  01/02/2020 3.7  06/01/2013 3.8   Phosphorus (mg/dL)  Date Value  01/05/2020 2.2 (L)  03/09/2013 3.0   Sodium (mmol/L)  Date Value  01/05/2020 142  05/28/2014 140   Corrected Ca: 8.3 mg/dL  Assessment: 49 year old male with PMHx of HTN, DM, COPD, CAD, hx CVA, sarcoidosis, CHF, afib on Eliquis, CKD 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 The patient failed an extubation trial this morning.  Goal of Therapy:  Potassium 4.0 - 5.1 mmol/L Magnesium 2.0 - 2.4 mg/dL Electrolytes WNL  Plan:   Phosphorous 4.1 >> 2.2 this AM. Will order Phos-Nak x 2 packets today  Reassess tomorrow with AM labs  Benita Gutter 01/05/2020 8:00 AM

## 2020-01-06 LAB — BASIC METABOLIC PANEL
Anion gap: 10 (ref 5–15)
BUN: 67 mg/dL — ABNORMAL HIGH (ref 6–20)
CO2: 32 mmol/L (ref 22–32)
Calcium: 8.2 mg/dL — ABNORMAL LOW (ref 8.9–10.3)
Chloride: 101 mmol/L (ref 98–111)
Creatinine, Ser: 2.38 mg/dL — ABNORMAL HIGH (ref 0.61–1.24)
GFR, Estimated: 33 mL/min — ABNORMAL LOW (ref >60)
Glucose, Bld: 112 mg/dL — ABNORMAL HIGH (ref 70–99)
Potassium: 4 mmol/L (ref 3.5–5.1)
Sodium: 143 mmol/L (ref 135–145)

## 2020-01-06 LAB — CBC WITH DIFFERENTIAL/PLATELET
Abs Immature Granulocytes: 0.02 10*3/uL (ref 0.00–0.07)
Basophils Absolute: 0 10*3/uL (ref 0.0–0.1)
Basophils Relative: 0 %
Eosinophils Absolute: 0 10*3/uL (ref 0.0–0.5)
Eosinophils Relative: 0 %
HCT: 41.6 % (ref 39.0–52.0)
Hemoglobin: 13.1 g/dL (ref 13.0–17.0)
Immature Granulocytes: 0 %
Lymphocytes Relative: 20 %
Lymphs Abs: 1.2 10*3/uL (ref 0.7–4.0)
MCH: 28.6 pg (ref 26.0–34.0)
MCHC: 31.5 g/dL (ref 30.0–36.0)
MCV: 90.8 fL (ref 80.0–100.0)
Monocytes Absolute: 1.1 10*3/uL — ABNORMAL HIGH (ref 0.1–1.0)
Monocytes Relative: 18 %
Neutro Abs: 3.8 10*3/uL (ref 1.7–7.7)
Neutrophils Relative %: 62 %
Platelets: 173 10*3/uL (ref 150–400)
RBC: 4.58 MIL/uL (ref 4.22–5.81)
RDW: 14.2 % (ref 11.5–15.5)
WBC: 6.2 10*3/uL (ref 4.0–10.5)
nRBC: 0 % (ref 0.0–0.2)

## 2020-01-06 LAB — PHOSPHORUS: Phosphorus: 4.1 mg/dL (ref 2.5–4.6)

## 2020-01-06 LAB — GLUCOSE, CAPILLARY
Glucose-Capillary: 101 mg/dL — ABNORMAL HIGH (ref 70–99)
Glucose-Capillary: 107 mg/dL — ABNORMAL HIGH (ref 70–99)
Glucose-Capillary: 117 mg/dL — ABNORMAL HIGH (ref 70–99)
Glucose-Capillary: 120 mg/dL — ABNORMAL HIGH (ref 70–99)
Glucose-Capillary: 121 mg/dL — ABNORMAL HIGH (ref 70–99)
Glucose-Capillary: 86 mg/dL (ref 70–99)

## 2020-01-06 LAB — PROTEIN / CREATININE RATIO, URINE
Creatinine, Urine: 45 mg/dL
Protein Creatinine Ratio: 0.47 mg/mg{Cre} — ABNORMAL HIGH (ref 0.00–0.15)
Total Protein, Urine: 21 mg/dL

## 2020-01-06 LAB — MAGNESIUM: Magnesium: 2.7 mg/dL — ABNORMAL HIGH (ref 1.7–2.4)

## 2020-01-06 MED ORDER — APIXABAN 5 MG PO TABS
5.0000 mg | ORAL_TABLET | Freq: Two times a day (BID) | ORAL | Status: DC
  Administered 2020-01-06 – 2020-01-12 (×12): 5 mg
  Filled 2020-01-06 (×12): qty 1

## 2020-01-06 NOTE — Progress Notes (Signed)
CRITICAL CARE NOTE 49 y.o.malewith medical history significant ofchronic combined systolic and diastolic CHF, COPD, CAD, history of MI, type 2 diabetes, hyperlipidemia, hypertension, sarcoidosis, sleep apnea not using CPAP regularly,  history of other nonhemorrhagic CVA who is brought to the emergency department with complaints of chest tightness, dyspnea associated with clear sputum productive cough that started about 2 to 3 weeks ago and has become progressively worse. Previous h/o [place]  SIGNIFICANT EVENTS 11/27 ICU admission, INTUBATED #8ETT 11/28 severe resp failure, progressive Renal failure, Nephrology consulted 11/29 - patient passed SBT today, able to communicate/follow directions while intubated status post extubation to BIPAP - after 1 hour on BIPAP patient became unresponsive and required re-intubation. I have met with mother in law at bedside and reviewed careplan.  11/30- patient remains on ventilator.  He has thickened mucopurulent tracheal secretions, he can follow verbal instructions but failed SBT today. GPC+ tracheal aspirate culture.  01/06/20- SBT for extubation, failed with heavy secretions.  TOC consult today.     CC  follow up respiratory failure  SUBJECTIVE Patient remains critically ill Prognosis is guarded   BP 97/71    Pulse 72    Temp 98.2 F (36.8 C)    Resp (!) 28    Ht 6' 1" (1.854 m)    Wt 118.8 kg    SpO2 96%    BMI 34.55 kg/m    I/O last 3 completed shifts: In: 969.1 [I.V.:929.1; NG/GT:40] Out: 2750 [Urine:2750] Total I/O In: 749.5 [I.V.:449.5; IV Piggyback:300] Out: 385 [Urine:325; Emesis/NG output:60]  SpO2: 96 % O2 Flow Rate (L/min): 3 L/min FiO2 (%): 24 %  Estimated body mass index is 34.55 kg/m as calculated from the following:   Height as of this encounter: 6' 1" (1.854 m).   Weight as of this encounter: 118.8 kg.    REVIEW OF SYSTEMS  PATIENT IS UNABLE TO PROVIDE COMPLETE REVIEW OF SYSTEMS DUE TO SEVERE CRITICAL  ILLNESS        PHYSICAL EXAMINATION:  GENERAL:obese age appropriate HEAD: Normocephalic, atraumatic.  EYES: Pupils equal, round, reactive to light.  No scleral icterus.  MOUTH: Moist mucosal membrane. +ETT NECK: Supple.  PULMONARY: +rhonchi, +wheezing CARDIOVASCULAR: S1 and S2. Regular rate and rhythm. No murmurs, rubs, or gallops.  GASTROINTESTINAL: Soft, nontender, -distended.  Positive bowel sounds.   MUSCULOSKELETAL: + edema.  NEUROLOGIC: obtunded, GCS<8T SKIN:intact,warm,dry  MEDICATIONS: I have reviewed all medications and confirmed regimen as documented   CULTURE RESULTS   Recent Results (from the past 240 hour(s))  Resp Panel by RT-PCR (Flu A&B, Covid) Nasopharyngeal Swab     Status: None   Collection Time: 01/01/20  3:13 PM   Specimen: Nasopharyngeal Swab; Nasopharyngeal(NP) swabs in vial transport medium  Result Value Ref Range Status   SARS Coronavirus 2 by RT PCR NEGATIVE NEGATIVE Final    Comment: (NOTE) SARS-CoV-2 target nucleic acids are NOT DETECTED.  The SARS-CoV-2 RNA is generally detectable in upper respiratory specimens during the acute phase of infection. The lowest concentration of SARS-CoV-2 viral copies this assay can detect is 138 copies/mL. A negative result does not preclude SARS-Cov-2 infection and should not be used as the sole basis for treatment or other patient management decisions. A negative result may occur with  improper specimen collection/handling, submission of specimen other than nasopharyngeal swab, presence of viral mutation(s) within the areas targeted by this assay, and inadequate number of viral copies(<138 copies/mL). A negative result must be combined with clinical observations, patient history, and epidemiological information. The  expected result is Negative.  Fact Sheet for Patients:  EntrepreneurPulse.com.au  Fact Sheet for Healthcare Providers:  IncredibleEmployment.be  This test  is no t yet approved or cleared by the [place] FDA and  has been authorized for detection and/or diagnosis of SARS-CoV-2 by FDA under an Emergency Use Authorization (EUA). This EUA will remain  in effect (meaning this test can be used) for the duration of the COVID-19 declaration under Section 564(b)(1) of the Act, 21 U.S.C.section 360bbb-3(b)(1), unless the authorization is terminated  or revoked sooner.       Influenza A by PCR NEGATIVE NEGATIVE Final   Influenza B by PCR NEGATIVE NEGATIVE Final    Comment: (NOTE) The Xpert Xpress SARS-CoV-2/FLU/RSV plus assay is intended as an aid in the diagnosis of influenza from Nasopharyngeal swab specimens and should not be used as a sole basis for treatment. Nasal washings and aspirates are unacceptable for Xpert Xpress SARS-CoV-2/FLU/RSV testing.  Fact Sheet for Patients: EntrepreneurPulse.com.au  Fact Sheet for Healthcare Providers: IncredibleEmployment.be  This test is not yet approved or cleared by the [place] FDA and has been authorized for detection and/or diagnosis of SARS-CoV-2 by FDA under an Emergency Use Authorization (EUA). This EUA will remain in effect (meaning this test can be used) for the duration of the COVID-19 declaration under Section 564(b)(1) of the Act, 21 U.S.C. section 360bbb-3(b)(1), unless the authorization is terminated or revoked.  Performed at [facility], [address] [place], [place] [address]   MRSA PCR Screening     Status: None   Collection Time: 01/02/20  2:45 PM   Specimen: Nasopharyngeal  Result Value Ref Range Status   MRSA by PCR NEGATIVE NEGATIVE Final    Comment:        The GeneXpert MRSA Assay (FDA approved for NASAL specimens only), is one component of a comprehensive MRSA colonization surveillance program. It is not intended to diagnose MRSA infection nor to guide or monitor treatment for MRSA infections. Performed at  [facility], [address] [place], [place] [address]   Culture, respiratory     Status: None   Collection Time: 01/02/20  5:00 PM   Specimen: INDUCED SPUTUM  Result Value Ref Range Status   Specimen Description   Final    INDUCED SPUTUM Performed at [facility], [address] [place], [place] [address]    Special Requests   Final    NONE Performed at [facility], [address] [place], [place] [address]    Gram Stain   Final    NO WBC SEEN RARE GRAM POSITIVE COCCI IN PAIRS IN CHAINS    Culture   Final    FEW Normal respiratory flora-no Staph aureus or Pseudomonas seen Performed at [facility], [address] [place], [place] [address]    Report Status 01/05/2020 FINAL  Final  Culture, respiratory     Status: None (Preliminary result)   Collection Time: 01/04/20 11:14 AM   Specimen: Tracheal Aspirate; Respiratory  Result Value Ref Range Status   Specimen Description   Final    TRACHEAL ASPIRATE Performed at [facility], [address] [place], [place] [address]    Special Requests   Final    NONE Performed at [facility], [address] [place],  [address]    Gram Stain   Final    FEW WBC PRESENT, PREDOMINANTLY PMN NO ORGANISMS SEEN    Culture   Final  NO GROWTH 2 DAYS Performed at [facility], [place] [address], [place], [place] [address]    Report Status PENDING  Incomplete          IMAGING    No results found.   Nutrition Status: Nutrition Problem: Inadequate oral intake Etiology: inability to eat Signs/Symptoms: NPO status Interventions: Tube feeding, Prostat, MVI     Indwelling Urinary Catheter continued, requirement due to   Reason to continue Indwelling Urinary Catheter strict Intake/Output monitoring for hemodynamic instability         Ventilator continued, requirement due to severe respiratory failure   Ventilator Sedation RASS 0 to -2       ASSESSMENT AND PLAN  49 yo AAM with severe morbid obesity with severe biventricular heart failure with acute and severe resp failue from acute COPD exacerbation with acute CAP with SEPSIS PRESENT ON ADMISSION with severe metabolic encephalopathy from hypoxia and hypercapnia with progressive renal failure  Severe ACUTE Hypoxic and Hypercapnic Respiratory Failure -continue Full MV support -continue Bronchodilator Therapy -Wean Fio2 and PEEP as tolerated -will perform SAT/SBT when respiratory parameters are met -VAP/VENT bundle implementation           Tracheal aspirate - 11/29-  On levofloxacin at this time         ABG reviewed - 11/29 - ventilator management performed -discussed with RT   12/1- sbt failed  [place]-  -oxygen as needed -Lasix as tolerated -follow up cardiology recs   Morbid obesity, possible OSA.   Will certainly impact respiratory mechanics, ventilator weaning Suspect will need to consider additional PEEP    ACUTE KIDNEY INJURY/Renal Failure -continue Foley Catheter-assess need -Avoid nephrotoxic agents -Follow urine output, BMP -Ensure adequate renal perfusion, optimize oxygenation -Renal dose medications Nephrology consulted     NEUROLOGY Acute toxic metabolic encephalopathy, need for sedation Goal RASS -2 to -3    CARDIAC ICU monitoring  ID abx for CAP -continue IV abx as prescibed -follow up cultures  GI GI PROPHYLAXIS as indicated  NUTRITIONAL STATUS Nutrition Status: Nutrition Problem: Inadequate oral intake Etiology: inability to eat DIET-->TF's as tolerated Constipation protocol as indicated  ENDO - will use ICU hypoglycemic\Hyperglycemia protocol if indicated     ELECTROLYTES -follow labs as needed -replace as needed -pharmacy consultation and following   DVT/GI PRX ordered and assessed TRANSFUSIONS AS NEEDED MONITOR FSBS I Assessed the need for Labs I Assessed the need for  Foley I Assessed the need for Central Venous Line Family Discussion when available I Assessed the need for Mobilization I made an Assessment of medications to be adjusted accordingly Safety Risk assessment completed   CASE DISCUSSED IN MULTIDISCIPLINARY ROUNDS WITH ICU TEAM  Critical Care Time devoted to patient care services described in this note is 34  minutes.   Overall, patient is critically ill, prognosis is guarded/poor.  Patient with Multiorgan failure and at high risk for cardiac arrest and death.      Ottie Glazier, M.D.  Pulmonary & [place]

## 2020-01-06 NOTE — Progress Notes (Signed)
[name], [place] 01/06/20  Subjective:   Hospital day # 4 Remains intubated  cvs: tachycardic Pulm: vent dependent, Fio2 24 % Neuro: propofol + fentanyl; Following simple commands  GI tube feeds 40 cc/hr  Renal: 11/30 0701 - 12/01 0700 In: 969.1 [I.V.:929.1; NG/GT:40] Out: 2150 [Urine:2150] Lab Results  Component Value Date   CREATININE 2.38 (H) 01/06/2020   CREATININE 2.25 (H) 01/05/2020   CREATININE 2.42 (H) 01/04/2020     Objective:  Vital signs in last 24 hours:  Temp:  [98.2 F (36.8 C)-99.7 F (37.6 C)] 98.2 F (36.8 C) (12/01 0900) Pulse Rate:  [59-80] 72 (12/01 0900) Resp:  [28] 28 (12/01 0900) BP: (92-115)/(57-74) 97/71 (12/01 0900) SpO2:  [91 %-100 %] 96 % (12/01 0900) FiO2 (%):  [24 %-30 %] 24 % (12/01 0824) Weight:  [118.8 kg] 118.8 kg (12/01 0455)  Weight change:  Filed Weights   01/01/20 1114 01/02/20 1439 01/06/20 0455  Weight: 123.9 kg 121.9 kg 118.8 kg    Intake/Output:    Intake/Output Summary (Last 24 hours) at 01/06/2020 0925 Last data filed at 01/06/2020 0800 Gross per 24 hour  Intake 1718.61 ml  Output 2375 ml  Net -656.39 ml     Physical Exam: General: Critically ill appearing  HEENT ETT in place  Pulm/lungs Vent assisted  CVS/Heart tachycardic  Abdomen:  soft  Extremities: + edema  Neurologic: Sedated but able to follow simple commands  Skin: warm   Foley in place with clear yellow urine       Basic Metabolic Panel:  Recent Labs  Lab 01/02/20 0421 01/02/20 0421 01/03/20 0455 01/03/20 0455 01/04/20 0440 01/05/20 0459 01/06/20 0544  NA 146*  --  145  --  143 142 143  K 5.7*  --  4.3  --  4.5 4.2 4.0  CL 99  --  98  --  100 100 101  CO2 40*  --  36*  --  36* 33* 32  GLUCOSE 173*  --  161*  --  148* 91 112*  BUN 23*  --  47*  --  55* 57* 67*  CREATININE 1.55*  --  2.59*  --  2.42* 2.25* 2.38*  CALCIUM 8.5*   < > 8.5*   < > 8.1* 8.3* 8.2*  MG  --   --  2.2  --  2.5* 2.6* 2.7*  PHOS   --   --  1.4*  --  4.1 2.2* 4.1   < > = values in this interval not displayed.     CBC: Recent Labs  Lab 01/01/20 1117 01/04/20 0440 01/05/20 0459 01/06/20 0544  WBC 8.6 12.9* 7.6 6.2  NEUTROABS  --   --   --  3.8  HGB 15.4 13.6 13.1 13.1  HCT 50.3 44.5 41.3 41.6  MCV 94.0 92.9 90.0 90.8  PLT 208 197 199 173      Lab Results  Component Value Date   HEPBSAG NON REACTIVE 01/03/2020   HEPBSAB NON REACTIVE 01/03/2020   HEPBIGM NON REACTIVE 01/03/2020      Microbiology:  Recent Results (from the past 240 hour(s))  Resp Panel by RT-PCR (Flu A&B, Covid) Nasopharyngeal Swab     Status: None   Collection Time: 01/01/20  3:13 PM   Specimen: Nasopharyngeal Swab; Nasopharyngeal(NP) swabs in vial transport medium  Result Value Ref Range Status   SARS Coronavirus 2 by RT PCR NEGATIVE NEGATIVE Final    Comment: (NOTE) SARS-CoV-2 target nucleic acids are  NOT DETECTED.  The SARS-CoV-2 RNA is generally detectable in upper respiratory specimens during the acute phase of infection. The lowest concentration of SARS-CoV-2 viral copies this assay can detect is 138 copies/mL. A negative result does not preclude SARS-Cov-2 infection and should not be used as the sole basis for treatment or other patient management decisions. A negative result may occur with  improper specimen collection/handling, submission of specimen other than nasopharyngeal swab, presence of viral mutation(s) within the areas targeted by this assay, and inadequate number of viral copies(<138 copies/mL). A negative result must be combined with clinical observations, patient history, and epidemiological information. The expected result is Negative.  Fact Sheet for Patients:  EntrepreneurPulse.com.au  Fact Sheet for Healthcare Providers:  IncredibleEmployment.be  This test is no t yet approved or cleared by the [place] FDA and  has been authorized for detection and/or  diagnosis of SARS-CoV-2 by FDA under an Emergency Use Authorization (EUA). This EUA will remain  in effect (meaning this test can be used) for the duration of the COVID-19 declaration under Section 564(b)(1) of the Act, 21 U.S.C.section 360bbb-3(b)(1), unless the authorization is terminated  or revoked sooner.       Influenza A by PCR NEGATIVE NEGATIVE Final   Influenza B by PCR NEGATIVE NEGATIVE Final    Comment: (NOTE) The Xpert Xpress SARS-CoV-2/FLU/RSV plus assay is intended as an aid in the diagnosis of influenza from Nasopharyngeal swab specimens and should not be used as a sole basis for treatment. Nasal washings and aspirates are unacceptable for Xpert Xpress SARS-CoV-2/FLU/RSV testing.  Fact Sheet for Patients: EntrepreneurPulse.com.au  Fact Sheet for Healthcare Providers: IncredibleEmployment.be  This test is not yet approved or cleared by the [place] FDA and has been authorized for detection and/or diagnosis of SARS-CoV-2 by FDA under an Emergency Use Authorization (EUA). This EUA will remain in effect (meaning this test can be used) for the duration of the COVID-19 declaration under Section 564(b)(1) of the Act, 21 U.S.C. section 360bbb-3(b)(1), unless the authorization is terminated or revoked.  Performed at [facility], [address] [place], [place] [address]   MRSA PCR Screening     Status: None   Collection Time: 01/02/20  2:45 PM   Specimen: Nasopharyngeal  Result Value Ref Range Status   MRSA by PCR NEGATIVE NEGATIVE Final    Comment:        The GeneXpert MRSA Assay (FDA approved for NASAL specimens only), is one component of a comprehensive MRSA colonization surveillance program. It is not intended to diagnose MRSA infection nor to guide or monitor treatment for MRSA infections. Performed at [facility], [address] [place], [place] [address]   Culture, respiratory     Status:  None   Collection Time: 01/02/20  5:00 PM   Specimen: INDUCED SPUTUM  Result Value Ref Range Status   Specimen Description   Final    INDUCED SPUTUM Performed at [facility], [address] [place], [place] [address]    Special Requests   Final    NONE Performed at [facility], [address] [place], [place] [address]    Gram Stain   Final    NO WBC SEEN RARE GRAM POSITIVE COCCI IN PAIRS IN CHAINS    Culture   Final    FEW Normal respiratory flora-no Staph aureus or Pseudomonas seen Performed at [facility], [address], [place], [place] [address]    Report Status 01/05/2020 FINAL  Final  Culture, respiratory     Status: None (Preliminary result)   Collection Time: 01/04/20 11:14 AM   Specimen: Tracheal Aspirate; Respiratory  Result Value Ref Range Status   Specimen Description   Final    TRACHEAL ASPIRATE Performed at [facility], [address] [place], [place] [address]    Special Requests   Final    NONE Performed at [facility], [address] [place], [place] [address]    Gram Stain   Final    FEW WBC PRESENT, PREDOMINANTLY PMN NO ORGANISMS SEEN    Culture   Final    NO GROWTH 2 DAYS Performed at [facility] [address] [place], [place] [address]    Report Status PENDING  Incomplete    Coagulation Studies: No results for input(s): LABPROT, INR in the last 72 hours.  Urinalysis: No results for input(s): COLORURINE, LABSPEC, PHURINE, GLUCOSEU, HGBUR, BILIRUBINUR, KETONESUR, PROTEINUR, UROBILINOGEN, NITRITE, LEUKOCYTESUR in the last 72 hours.  Invalid input(s): APPERANCEUR    Imaging: DG Abd 1 View  Result Date: 01/04/2020 CLINICAL DATA:  Status post orogastric tube placement. EXAM: ABDOMEN - 1 VIEW COMPARISON:  February 11, 2015. FINDINGS: The bowel gas pattern is normal. Distal tip of enteric tube is seen in proximal stomach. No radio-opaque calculi or other significant radiographic  abnormality are seen. IMPRESSION: Distal tip of enteric tube seen in proximal stomach. Electronically Signed   By: Marijo Conception M.D.   On: 01/04/2020 11:39   DG Chest Port 1 View  Result Date: 01/04/2020 CLINICAL DATA:  Status post intubation. EXAM: PORTABLE CHEST 1 VIEW COMPARISON:  Same day. FINDINGS: The heart size and mediastinal contours are within normal limits. Endotracheal and nasogastric tubes are unchanged in position. No pneumothorax is noted. Stable bilateral lung opacities are noted most consistent with scarring, with probable small left pleural effusion. The visualized skeletal structures are unremarkable. IMPRESSION: Stable support apparatus. Stable bilateral lung opacities are noted most consistent with scarring, with probable small left pleural effusion. Electronically Signed   By: Marijo Conception M.D.   On: 01/04/2020 11:38     Medications:   . sodium chloride    . sodium chloride    . famotidine (PEPCID) IV Stopped (01/05/20 2156)  . fentaNYL infusion INTRAVENOUS 150 mcg/hr (01/06/20 0800)  . levofloxacin (LEVAQUIN) IV Stopped (01/05/20 1900)  . propofol (DIPRIVAN) infusion 30 mcg/kg/min (01/06/20 0800)   . acetylcysteine  4 mL Nebulization BID  . budesonide (PULMICORT) nebulizer solution  0.5 mg Nebulization BID  . chlorhexidine gluconate (MEDLINE KIT)  15 mL Mouth Rinse BID  . Chlorhexidine Gluconate Cloth  6 each Topical Q0600  . docusate  100 mg Per Tube BID  . enoxaparin (LOVENOX) injection  125 mg Subcutaneous Q12H  . feeding supplement (PROSource TF)  90 mL Per Tube QID  . feeding supplement (VITAL HIGH PROTEIN)  1,000 mL Per Tube Q24H  . insulin aspart  0-20 Units Subcutaneous TID WC  . ipratropium-albuterol  3 mL Nebulization Q4H  . mouth rinse  15 mL Mouth Rinse 10 times per day  . multivitamin with minerals  1 tablet Per Tube Daily  . polyethylene glycol  17 g Per Tube Daily   acetaminophen **OR** acetaminophen, albuterol, fentaNYL, metoprolol  tartrate, midazolam, ondansetron **OR** ondansetron (ZOFRAN) IV  Assessment/ Plan:  49 y.o. male with     admitted on 01/01/2020 for Shortness of breath [R06.02] Hypercapnia [R06.89] COPD exacerbation (HCC) [J44.1] Acute respiratory failure with hypoxia and  hypercapnia ([place]) [J96.01, J96.02] Acute and chr resp failure, unsp w hypoxia or hypercapnia ([place]) [J96.20]   #. Acute kidney injury on chronic kidney disease stage IIIA with proteinuria: baseline creatinine of 1.41, GFR of 58 on 08/01/19.  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  Acute kidney injury secondary to ATN from hypotension and hypoxia Nonoliguric urine output. No IV contrast exposure.  Lab Results  Component Value Date   CREATININE 2.38 (H) 01/06/2020   CREATININE 2.25 (H) 01/05/2020   CREATININE 2.42 (H) 01/04/2020   - Renal ultrasound- unremarkable , no obstruction - continue to hold lisinopril and furosemide - S Creatinine fluctuating, likely from hemodynamic changes - No indication for dialysis.  Continue to monitor urine output, renal function, serum electrolytes and volume status.   #.   Acute respiratory failure: requiring intubation and mechanical ventilation.  Acute exacerbation of COPD and sarcoidosis. 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   #. Diabetes mellitus type II with chronic kidney disease: insulin dependent.  Lab Results  Component Value Date   HGBA1C 6.5 (H) 01/02/2020   obtain UPC    LOS: [place] 12/1/20219:25 AM  [place], [place]  Note: This note was prepared with Dragon dictation. Any transcription errors are unintentional

## 2020-01-06 NOTE — Consult Note (Signed)
PHARMACY CONSULT NOTE  Pharmacy Consult for Electrolyte Monitoring and Replacement   Recent Labs: Potassium (mmol/L)  Date Value  01/06/2020 4.0  05/28/2014 3.9   Magnesium (mg/dL)  Date Value  01/06/2020 2.7 (H)  05/14/2013 1.8   Calcium (mg/dL)  Date Value  01/06/2020 8.2 (L)   Calcium, Total (mg/dL)  Date Value  05/28/2014 8.4 (L)   Albumin (g/dL)  Date Value  01/02/2020 3.7  06/01/2013 3.8   Phosphorus (mg/dL)  Date Value  01/06/2020 4.1  03/09/2013 3.0   Sodium (mmol/L)  Date Value  01/06/2020 143  05/28/2014 140   Corrected Ca: 8.3 mg/dL  Assessment: 49 year old male with PMHx of HTN, DM, COPD, CAD, hx CVA, sarcoidosis, CHF, afib on Eliquis, CKD 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 The patient failed an extubation trial this morning.  12/1: Urine output improving, 2150 mL yesterday  Goal of Therapy:  Potassium 4.0 - 5.1 mmol/L Magnesium 2.0 - 2.4 mg/dL Electrolytes WNL  Plan:   Phosphorous 2.2 >> 4.1 this AM  No electrolyte replacement indicated today  Reassess tomorrow with AM labs  Benita Gutter 01/06/2020 8:41 AM

## 2020-01-07 ENCOUNTER — Inpatient Hospital Stay: Payer: Medicare HMO

## 2020-01-07 LAB — GLUCOSE, CAPILLARY
Glucose-Capillary: 101 mg/dL — ABNORMAL HIGH (ref 70–99)
Glucose-Capillary: 103 mg/dL — ABNORMAL HIGH (ref 70–99)
Glucose-Capillary: 105 mg/dL — ABNORMAL HIGH (ref 70–99)
Glucose-Capillary: 115 mg/dL — ABNORMAL HIGH (ref 70–99)
Glucose-Capillary: 120 mg/dL — ABNORMAL HIGH (ref 70–99)
Glucose-Capillary: 157 mg/dL — ABNORMAL HIGH (ref 70–99)
Glucose-Capillary: 90 mg/dL (ref 70–99)
Glucose-Capillary: 97 mg/dL (ref 70–99)

## 2020-01-07 LAB — BASIC METABOLIC PANEL
Anion gap: 10 (ref 5–15)
BUN: 74 mg/dL — ABNORMAL HIGH (ref 6–20)
CO2: 30 mmol/L (ref 22–32)
Calcium: 8.1 mg/dL — ABNORMAL LOW (ref 8.9–10.3)
Chloride: 103 mmol/L (ref 98–111)
Creatinine, Ser: 2.34 mg/dL — ABNORMAL HIGH (ref 0.61–1.24)
GFR, Estimated: 33 mL/min — ABNORMAL LOW (ref >60)
Glucose, Bld: 127 mg/dL — ABNORMAL HIGH (ref 70–99)
Potassium: 3.8 mmol/L (ref 3.5–5.1)
Sodium: 143 mmol/L (ref 135–145)

## 2020-01-07 LAB — CBC WITH DIFFERENTIAL/PLATELET
Abs Immature Granulocytes: 0.04 10*3/uL (ref 0.00–0.07)
Basophils Absolute: 0 10*3/uL (ref 0.0–0.1)
Basophils Relative: 0 %
Eosinophils Absolute: 0 10*3/uL (ref 0.0–0.5)
Eosinophils Relative: 1 %
HCT: 43.4 % (ref 39.0–52.0)
Hemoglobin: 13.4 g/dL (ref 13.0–17.0)
Immature Granulocytes: 1 %
Lymphocytes Relative: 25 %
Lymphs Abs: 1.3 10*3/uL (ref 0.7–4.0)
MCH: 28.5 pg (ref 26.0–34.0)
MCHC: 30.9 g/dL (ref 30.0–36.0)
MCV: 92.1 fL (ref 80.0–100.0)
Monocytes Absolute: 1 10*3/uL (ref 0.1–1.0)
Monocytes Relative: 19 %
Neutro Abs: 2.8 10*3/uL (ref 1.7–7.7)
Neutrophils Relative %: 54 %
Platelets: 153 10*3/uL (ref 150–400)
RBC: 4.71 MIL/uL (ref 4.22–5.81)
RDW: 14.3 % (ref 11.5–15.5)
WBC: 5.2 10*3/uL (ref 4.0–10.5)
nRBC: 0 % (ref 0.0–0.2)

## 2020-01-07 LAB — PHOSPHORUS: Phosphorus: 5 mg/dL — ABNORMAL HIGH (ref 2.5–4.6)

## 2020-01-07 LAB — MAGNESIUM: Magnesium: 2.9 mg/dL — ABNORMAL HIGH (ref 1.7–2.4)

## 2020-01-07 LAB — CULTURE, RESPIRATORY W GRAM STAIN: Culture: NO GROWTH

## 2020-01-07 LAB — TRIGLYCERIDES: Triglycerides: 204 mg/dL — ABNORMAL HIGH (ref <150)

## 2020-01-07 MED ORDER — CHLORHEXIDINE GLUCONATE 0.12 % MT SOLN
OROMUCOSAL | Status: AC
  Filled 2020-01-07: qty 15

## 2020-01-07 MED ORDER — IPRATROPIUM-ALBUTEROL 0.5-2.5 (3) MG/3ML IN SOLN
3.0000 mL | Freq: Two times a day (BID) | RESPIRATORY_TRACT | Status: DC
  Administered 2020-01-07 – 2020-01-08 (×2): 3 mL via RESPIRATORY_TRACT
  Filled 2020-01-07 (×2): qty 3

## 2020-01-07 MED ORDER — MIDAZOLAM HCL 2 MG/2ML IJ SOLN: 2.0000 mg | INTRAMUSCULAR | Status: DC | PRN

## 2020-01-07 MED ORDER — VECURONIUM BROMIDE 10 MG IV SOLR
10.0000 mg | INTRAVENOUS | Status: DC | PRN
  Administered 2020-01-07: 10 mg via INTRAVENOUS

## 2020-01-07 MED ORDER — VECURONIUM BROMIDE 10 MG IV SOLR
INTRAVENOUS | Status: AC
  Filled 2020-01-07: qty 10

## 2020-01-07 MED ORDER — MIDAZOLAM HCL 2 MG/2ML IJ SOLN
2.0000 mg | INTRAMUSCULAR | Status: DC | PRN
  Administered 2020-01-07 – 2020-01-15 (×7): 2 mg via INTRAVENOUS
  Filled 2020-01-07 (×6): qty 2

## 2020-01-07 NOTE — Progress Notes (Signed)
[place], [place] 01/07/20  Subjective:   Hospital day # 5 Remains intubated cvs: no pressors, regular rhythm Pulm: vent dependent, Fio2 24 % Neuro: propofol + fentanyl; Following simple commands  GI tube feeds 40 cc/hr  Renal: 12/01 0701 - 12/02 0700 In: 1816.7 [I.V.:1266.8; IV Piggyback:549.9] Out: 4268 [Urine:1475; Emesis/NG output:60] Lab Results  Component Value Date   CREATININE 2.34 (H) 01/07/2020   CREATININE 2.38 (H) 01/06/2020   CREATININE 2.25 (H) 01/05/2020     Objective:  Vital signs in last 24 hours:  Temp:  [97.3 F (36.3 C)-98.4 F (36.9 C)] 97.3 F (36.3 C) (12/02 0600) Pulse Rate:  [63-85] 70 (12/02 0600) Resp:  [19-28] 28 (12/02 0600) BP: (81-150)/(56-90) 104/67 (12/02 0600) SpO2:  [80 %-99 %] 98 % (12/02 0600) FiO2 (%):  [24 %-28 %] 28 % (12/02 0807) Weight:  [120.5 kg] 120.5 kg (12/02 0500)  Weight change: 1.7 kg Filed Weights   01/02/20 1439 01/06/20 0455 01/07/20 0500  Weight: 121.9 kg 118.8 kg 120.5 kg    Intake/Output:    Intake/Output Summary (Last 24 hours) at 01/07/2020 0934 Last data filed at 01/07/2020 0600 Gross per 24 hour  Intake 1067.25 ml  Output 1310 ml  Net -242.75 ml     Physical Exam: General: Critically ill appearing  HEENT ETT in place  Pulm/lungs Vent assisted  CVS/Heart tachycardic  Abdomen:  soft  Extremities: trace edema  Neurologic: Sedated but able to follow simple commands  Skin: warm   Foley in place with clear yellow urine       Basic Metabolic Panel:  Recent Labs  Lab 01/03/20 0455 01/03/20 0455 01/04/20 0440 01/04/20 0440 01/05/20 0459 01/06/20 0544 01/07/20 0621  NA 145  --  143  --  142 143 143  K 4.3  --  4.5  --  4.2 4.0 3.8  CL 98  --  100  --  100 101 103  CO2 36*  --  36*  --  33* 32 30  GLUCOSE 161*  --  148*  --  91 112* 127*  BUN 47*  --  55*  --  57* 67* 74*  CREATININE 2.59*  --  2.42*  --  2.25* 2.38* 2.34*  CALCIUM 8.5*   < > 8.1*   < >  8.3* 8.2* 8.1*  MG 2.2  --  2.5*  --  2.6* 2.7* 2.9*  PHOS 1.4*  --  4.1  --  2.2* 4.1 5.0*   < > = values in this interval not displayed.     CBC: Recent Labs  Lab 01/01/20 1117 01/04/20 0440 01/05/20 0459 01/06/20 0544 01/07/20 0621  WBC 8.6 12.9* 7.6 6.2 5.2  NEUTROABS  --   --   --  3.8 2.8  HGB 15.4 13.6 13.1 13.1 13.4  HCT 50.3 44.5 41.3 41.6 43.4  MCV 94.0 92.9 90.0 90.8 92.1  PLT 208 197 199 173 153      Lab Results  Component Value Date   HEPBSAG NON REACTIVE 01/03/2020   HEPBSAB NON REACTIVE 01/03/2020   HEPBIGM NON REACTIVE 01/03/2020      Microbiology:  Recent Results (from the past 240 hour(s))  Resp Panel by RT-PCR (Flu A&B, Covid) Nasopharyngeal Swab     Status: None   Collection Time: 01/01/20  3:13 PM   Specimen: Nasopharyngeal Swab; Nasopharyngeal(NP) swabs in vial transport medium  Result Value Ref Range Status   SARS Coronavirus 2 by RT PCR NEGATIVE NEGATIVE Final  Comment: (NOTE) SARS-CoV-2 target nucleic acids are NOT DETECTED.  The SARS-CoV-2 RNA is generally detectable in upper respiratory specimens during the acute phase of infection. The lowest concentration of SARS-CoV-2 viral copies this assay can detect is 138 copies/mL. A negative result does not preclude SARS-Cov-2 infection and should not be used as the sole basis for treatment or other patient management decisions. A negative result may occur with  improper specimen collection/handling, submission of specimen other than nasopharyngeal swab, presence of viral mutation(s) within the areas targeted by this assay, and inadequate number of viral copies(<138 copies/mL). A negative result must be combined with clinical observations, patient history, and epidemiological information. The expected result is Negative.  Fact Sheet for Patients:  EntrepreneurPulse.com.au  Fact Sheet for Healthcare Providers:  IncredibleEmployment.be  This test is  no t yet approved or cleared by the [place] FDA and  has been authorized for detection and/or diagnosis of SARS-CoV-2 by FDA under an Emergency Use Authorization (EUA). This EUA will remain  in effect (meaning this test can be used) for the duration of the COVID-19 declaration under Section 564(b)(1) of the Act, 21 U.S.C.section 360bbb-3(b)(1), unless the authorization is terminated  or revoked sooner.       Influenza A by PCR NEGATIVE NEGATIVE Final   Influenza B by PCR NEGATIVE NEGATIVE Final    Comment: (NOTE) The Xpert Xpress SARS-CoV-2/FLU/RSV plus assay is intended as an aid in the diagnosis of influenza from Nasopharyngeal swab specimens and should not be used as a sole basis for treatment. Nasal washings and aspirates are unacceptable for Xpert Xpress SARS-CoV-2/FLU/RSV testing.  Fact Sheet for Patients: EntrepreneurPulse.com.au  Fact Sheet for Healthcare Providers: IncredibleEmployment.be  This test is not yet approved or cleared by the [place] FDA and has been authorized for detection and/or diagnosis of SARS-CoV-2 by FDA under an Emergency Use Authorization (EUA). This EUA will remain in effect (meaning this test can be used) for the duration of the COVID-19 declaration under Section 564(b)(1) of the Act, 21 U.S.C. section 360bbb-3(b)(1), unless the authorization is terminated or revoked.  Performed at [facility], [address] [place], [place] [address]   MRSA PCR Screening     Status: None   Collection Time: 01/02/20  2:45 PM   Specimen: Nasopharyngeal  Result Value Ref Range Status   MRSA by PCR NEGATIVE NEGATIVE Final    Comment:        The GeneXpert MRSA Assay (FDA approved for NASAL specimens only), is one component of a comprehensive MRSA colonization surveillance program. It is not intended to diagnose MRSA infection nor to guide or monitor treatment for MRSA infections. Performed at  [facility], [address] [place], [place] [address]   Culture, respiratory     Status: None   Collection Time: 01/02/20  5:00 PM   Specimen: INDUCED SPUTUM  Result Value Ref Range Status   Specimen Description   Final    INDUCED SPUTUM Performed at [facility], [address], [place], [place] [address]    Special Requests   Final    NONE Performed at [facility], [address] [place], [place] [address]    Gram Stain   Final    NO WBC SEEN RARE GRAM POSITIVE COCCI IN PAIRS IN CHAINS    Culture   Final    FEW Normal respiratory flora-no Staph aureus or Pseudomonas seen Performed at [facility], [address] [place], [place] [address]  Report Status 01/05/2020 FINAL  Final  Culture, respiratory     Status: None (Preliminary result)   Collection Time: 01/04/20 11:14 AM   Specimen: Tracheal Aspirate; Respiratory  Result Value Ref Range Status   Specimen Description   Final    TRACHEAL ASPIRATE Performed at [facility], [address] [place], [place] [address]    Special Requests   Final    NONE Performed at [facility] [place], [address] [place], [place] [address]    Gram Stain   Final    FEW WBC PRESENT, PREDOMINANTLY PMN NO ORGANISMS SEEN    Culture   Final    NO GROWTH 2 DAYS Performed at [facility] [address] [place], [place] [address]    Report Status PENDING  Incomplete    Coagulation Studies: No results for input(s): LABPROT, INR in the last 72 hours.  Urinalysis: No results for input(s): COLORURINE, LABSPEC, PHURINE, GLUCOSEU, HGBUR, BILIRUBINUR, KETONESUR, PROTEINUR, UROBILINOGEN, NITRITE, LEUKOCYTESUR in the last 72 hours.  Invalid input(s): APPERANCEUR    Imaging: No results found.   Medications:   . sodium chloride    . sodium chloride    . famotidine (PEPCID) IV 20 mg (01/07/20 0837)  . fentaNYL infusion INTRAVENOUS 200 mcg/hr (01/07/20 0600)  .  levofloxacin (LEVAQUIN) IV Stopped (01/06/20 1951)  . propofol (DIPRIVAN) infusion 30 mcg/kg/min (01/07/20 0600)   . acetylcysteine  4 mL Nebulization BID  . apixaban  5 mg Per Tube BID  . budesonide (PULMICORT) nebulizer solution  0.5 mg Nebulization BID  . chlorhexidine gluconate (MEDLINE KIT)  15 mL Mouth Rinse BID  . Chlorhexidine Gluconate Cloth  6 each Topical Q0600  . docusate  100 mg Per Tube BID  . feeding supplement (PROSource TF)  90 mL Per Tube QID  . feeding supplement (VITAL HIGH PROTEIN)  1,000 mL Per Tube Q24H  . insulin aspart  0-20 Units Subcutaneous TID WC  . ipratropium-albuterol  3 mL Nebulization BID  . mouth rinse  15 mL Mouth Rinse 10 times per day  . multivitamin with minerals  1 tablet Per Tube Daily  . polyethylene glycol  17 g Per Tube Daily   acetaminophen **OR** acetaminophen, albuterol, fentaNYL, metoprolol tartrate, midazolam, ondansetron **OR** ondansetron (ZOFRAN) IV  Assessment/ Plan:  49 y.o. male with     admitted on 01/01/2020 for Shortness of breath [R06.02] Hypercapnia [R06.89] COPD exacerbation (HCC) [J44.1] Acute respiratory failure with hypoxia and hypercapnia (HCC) [J96.01, J96.02] Acute and chr resp failure, unsp w hypoxia or hypercapnia ([place]) [J96.20]   #. Acute kidney injury on chronic kidney disease stage IIIA with proteinuria: baseline creatinine of 1.41, GFR of 58 on 08/01/19.  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  Acute kidney injury secondary to ATN from hypotension and hypoxia Nonoliguric urine output. No IV contrast exposure.  Lab Results  Component Value Date   CREATININE 2.34 (H) 01/07/2020   CREATININE 2.38 (H) 01/06/2020   CREATININE 2.25 (H) 01/05/2020   - Renal ultrasound- unremarkable , no obstruction - continue to hold lisinopril and furosemide UOP >1400 - S Creatinine fluctuating, likely from hemodynamic changes - No indication for dialysis.  Continue to monitor  urine output, renal function, serum electrolytes and volume status.   #.   Acute respiratory failure: requiring intubation and mechanical ventilation.  Acute exacerbation of COPD and sarcoidosis. 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   #. Diabetes mellitus type II with chronic kidney disease: insulin  dependent.  Lab Results  Component Value Date   HGBA1C 6.5 (H) 01/02/2020  UPC 0.47    LOS: [place] 12/2/20219:34 AM  [name], [place]  Note: This note was prepared with Dragon dictation. Any transcription errors are unintentional

## 2020-01-07 NOTE — Progress Notes (Addendum)
CRITICAL CARE NOTE 49 y.o.malewith medical history significant ofchronic combined systolic and diastolic CHF, COPD, CAD, history of MI, type 2 diabetes, hyperlipidemia, hypertension, sarcoidosis, sleep apnea not using CPAP regularly,  history of other nonhemorrhagic CVA who is brought to the emergency department with complaints of chest tightness, dyspnea associated with clear sputum productive cough that started about 2 to 3 weeks ago and has become progressively worse. Previous h/o [place]  SIGNIFICANT EVENTS 11/27 ICU admission, INTUBATED #8ETT 11/28 severe resp failure, progressive Renal failure, Nephrology consulted 11/29 - patient passed SBT today, able to communicate/follow directions while intubated status post extubation to BIPAP - after 1 hour on BIPAP patient became unresponsive and required re-intubation. I have met with mother in law at bedside and reviewed careplan.  11/30- patient remains on ventilator.  He has thickened mucopurulent tracheal secretions, he can follow verbal instructions but failed SBT today. GPC+ tracheal aspirate culture.  01/06/20- SBT for extubation, failed with heavy secretions.  TOC consult today.   01/07/20- patient failed SBT after 10 minutes with acute hypoxemia.  PALS consult for VDRF.  Spoke with Wife Sharl Ma about trache she wants to wait for several days   CC  follow up respiratory failure  SUBJECTIVE Patient remains critically ill Prognosis is guarded   BP 93/63   Pulse 65   Temp (!) 97.3 F (36.3 C)   Resp (!) 28   Ht 6' 1"  (1.854 m)   Wt 120.5 kg   SpO2 98%   BMI 35.05 kg/m    I/O last 3 completed shifts: In: 1816.7 [I.V.:1266.8; IV Piggyback:549.9] Out: 2635 [Urine:2575; Emesis/NG output:60] Total I/O In: 163.2 [I.V.:125.9; IV Piggyback:37.3] Out: 375 [Urine:375]  SpO2: 98 % O2 Flow Rate (L/min): 3 L/min FiO2 (%): 24 %  Estimated body mass index is 35.05 kg/m as calculated from the following:   Height as of  this encounter: 6' 1"  (1.854 m).   Weight as of this encounter: 120.5 kg.    REVIEW OF SYSTEMS  PATIENT IS UNABLE TO PROVIDE COMPLETE REVIEW OF SYSTEMS DUE TO SEVERE CRITICAL ILLNESS        PHYSICAL EXAMINATION:  GENERAL:obese age appropriate HEAD: Normocephalic, atraumatic.  EYES: Pupils equal, round, reactive to light.  No scleral icterus.  MOUTH: Moist mucosal membrane. +ETT NECK: Supple.  PULMONARY: +rhonchi,  CARDIOVASCULAR: S1 and S2. Regular rate and rhythm. No murmurs, rubs, or gallops.  GASTROINTESTINAL: Soft, nontender, -distended.  Positive bowel sounds.   MUSCULOSKELETAL: + edema.  NEUROLOGIC: obtunded, GCS<8T SKIN:intact,warm,dry  MEDICATIONS: I have reviewed all medications and confirmed regimen as documented   CULTURE RESULTS   Recent Results (from the past 240 hour(s))  Resp Panel by RT-PCR (Flu A&B, Covid) Nasopharyngeal Swab     Status: None   Collection Time: 01/01/20  3:13 PM   Specimen: Nasopharyngeal Swab; Nasopharyngeal(NP) swabs in vial transport medium  Result Value Ref Range Status   SARS Coronavirus 2 by RT PCR NEGATIVE NEGATIVE Final    Comment: (NOTE) SARS-CoV-2 target nucleic acids are NOT DETECTED.  The SARS-CoV-2 RNA is generally detectable in upper respiratory specimens during the acute phase of infection. The lowest concentration of SARS-CoV-2 viral copies this assay can detect is 138 copies/mL. A negative result does not preclude SARS-Cov-2 infection and should not be used as the sole basis for treatment or other patient management decisions. A negative result may occur with  improper specimen collection/handling, submission of specimen other than nasopharyngeal swab, presence of viral mutation(s) within the areas targeted by  this assay, and inadequate number of viral copies(<138 copies/mL). A negative result must be combined with clinical observations, patient history, and epidemiological information. The expected result is  Negative.  Fact Sheet for Patients:  EntrepreneurPulse.com.au  Fact Sheet for Healthcare Providers:  IncredibleEmployment.be  This test is no t yet approved or cleared by the [place] FDA and  has been authorized for detection and/or diagnosis of SARS-CoV-2 by FDA under an Emergency Use Authorization (EUA). This EUA will remain  in effect (meaning this test can be used) for the duration of the COVID-19 declaration under Section 564(b)(1) of the Act, 21 U.S.C.section 360bbb-3(b)(1), unless the authorization is terminated  or revoked sooner.       Influenza A by PCR NEGATIVE NEGATIVE Final   Influenza B by PCR NEGATIVE NEGATIVE Final    Comment: (NOTE) The Xpert Xpress SARS-CoV-2/FLU/RSV plus assay is intended as an aid in the diagnosis of influenza from Nasopharyngeal swab specimens and should not be used as a sole basis for treatment. Nasal washings and aspirates are unacceptable for Xpert Xpress SARS-CoV-2/FLU/RSV testing.  Fact Sheet for Patients: EntrepreneurPulse.com.au  Fact Sheet for Healthcare Providers: IncredibleEmployment.be  This test is not yet approved or cleared by the [place] FDA and has been authorized for detection and/or diagnosis of SARS-CoV-2 by FDA under an Emergency Use Authorization (EUA). This EUA will remain in effect (meaning this test can be used) for the duration of the COVID-19 declaration under Section 564(b)(1) of the Act, 21 U.S.C. section 360bbb-3(b)(1), unless the authorization is terminated or revoked.  Performed at [facility], [address] [place], [place] [address]   MRSA PCR Screening     Status: None   Collection Time: 01/02/20  2:45 PM   Specimen: Nasopharyngeal  Result Value Ref Range Status   MRSA by PCR NEGATIVE NEGATIVE Final    Comment:        The GeneXpert MRSA Assay (FDA approved for NASAL specimens only), is one component  of a comprehensive MRSA colonization surveillance program. It is not intended to diagnose MRSA infection nor to guide or monitor treatment for MRSA infections. Performed at [facility] [address] [place], [place] [address]   Culture, respiratory     Status: None   Collection Time: 01/02/20  5:00 PM   Specimen: INDUCED SPUTUM  Result Value Ref Range Status   Specimen Description   Final    INDUCED SPUTUM Performed at [facility], [address], [place], [place] [address]    Special Requests   Final    NONE Performed at [facility], [address] [place], [place] [address]    Gram Stain   Final    NO WBC SEEN RARE GRAM POSITIVE COCCI IN PAIRS IN CHAINS    Culture   Final    FEW Normal respiratory flora-no Staph aureus or Pseudomonas seen Performed at [facility], [address] [place], [place] [address]    Report Status 01/05/2020 FINAL  Final  Culture, respiratory     Status: None   Collection Time: 01/04/20 11:14 AM   Specimen: Tracheal Aspirate; Respiratory  Result Value Ref Range Status   Specimen Description   Final    TRACHEAL ASPIRATE Performed at [facility], [address] [place], [place] [address]    Special Requests   Final    NONE Performed at [facility], [address] [place], [place] [address]    Gram Stain  Final    FEW WBC PRESENT, PREDOMINANTLY PMN NO ORGANISMS SEEN    Culture   Final    NO GROWTH 2 DAYS Performed at [facility] [address] [place], [place] [address]    Report Status 01/07/2020 FINAL  Final          IMAGING    No results found.   Nutrition Status: Nutrition Problem: Inadequate oral intake Etiology: inability to eat Signs/Symptoms: NPO status Interventions: Tube feeding, Prostat, MVI     Indwelling Urinary Catheter continued, requirement due to   Reason to continue Indwelling Urinary Catheter strict Intake/Output monitoring  for hemodynamic instability         Ventilator continued, requirement due to severe respiratory failure   Ventilator Sedation RASS 0 to -2      ASSESSMENT AND PLAN  49 yo AAM with severe morbid obesity with severe biventricular heart failure with acute and severe resp failue from acute COPD exacerbation with acute CAP with SEPSIS PRESENT ON ADMISSION with severe metabolic encephalopathy from hypoxia and hypercapnia with progressive renal failure  Severe ACUTE Hypoxic and Hypercapnic Respiratory Failure -continue Full MV support -continue Bronchodilator Therapy -Wean Fio2 and PEEP as tolerated -will perform SAT/SBT when respiratory parameters are met -VAP/VENT bundle implementation           Tracheal aspirate - 11/29-  On levofloxacin at this time         ABG reviewed - 11/29 - ventilator management performed -discussed with RT    01/07/20 -Ventilator dependent respiratory failure - patient quickly hypercapnic and hypoxemic off vent but on vent lucid calm and oriented. - Palliative consult -consider trache -patient previously trached otherwise comfort measures    Morbid obesity, possible OSA.   Will certainly impact respiratory mechanics, ventilator weaning Suspect will need to consider additional PEEP    ACUTE KIDNEY INJURY/Renal Failure -continue Foley Catheter-assess need -Avoid nephrotoxic agents -Follow urine output, BMP -Ensure adequate renal perfusion, optimize oxygenation -Renal dose medications Nephrology consulted     CARDIAC ICU monitoring -hx of biventricular failure  ID abx for CAP -continue IV abx as prescibed -follow up cultures  GI GI PROPHYLAXIS as indicated  NUTRITIONAL STATUS Nutrition Status: Nutrition Problem: Inadequate oral intake Etiology: inability to eat DIET-->TF's as tolerated Constipation protocol as indicated  ENDO - will use ICU hypoglycemic\Hyperglycemia protocol if indicated     ELECTROLYTES -follow labs as  needed -replace as needed -pharmacy consultation and following   DVT/GI PRX ordered and assessed TRANSFUSIONS AS NEEDED MONITOR FSBS I Assessed the need for Labs I Assessed the need for Foley I Assessed the need for Central Venous Line Family Discussion when available I Assessed the need for Mobilization I made an Assessment of medications to be adjusted accordingly Safety Risk assessment completed   CASE DISCUSSED IN MULTIDISCIPLINARY ROUNDS WITH ICU TEAM  Critical Care Time devoted to patient care services described in this note is 34  minutes.   Overall, patient is critically ill, prognosis is guarded/poor.  Patient with Multiorgan failure and at high risk for cardiac arrest and death.      Ottie Glazier, M.D.  Pulmonary & [place]

## 2020-01-07 NOTE — Consult Note (Signed)
PHARMACY CONSULT NOTE  Pharmacy Consult for Electrolyte Monitoring and Replacement   Recent Labs: Potassium (mmol/L)  Date Value  01/07/2020 3.8  05/28/2014 3.9   Magnesium (mg/dL)  Date Value  01/07/2020 2.9 (H)  05/14/2013 1.8   Calcium (mg/dL)  Date Value  01/07/2020 8.1 (L)   Calcium, Total (mg/dL)  Date Value  05/28/2014 8.4 (L)   Albumin (g/dL)  Date Value  01/02/2020 3.7  06/01/2013 3.8   Phosphorus (mg/dL)  Date Value  01/07/2020 5.0 (H)  03/09/2013 3.0   Sodium (mmol/L)  Date Value  01/07/2020 143  05/28/2014 140   Corrected Ca: 8.3 mg/dL  Assessment: 49 year old male with PMHx of HTN, DM, COPD, CAD, hx CVA, sarcoidosis, CHF, afib on Eliquis, CKD 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 The patient failed an extubation trial this morning.  12/1: Urine output 2150 mL yesterday 12/2: Urine output 1475 mL yesterday  Goal of Therapy:  Potassium 4.0 - 5.1 mmol/L Magnesium 2.0 - 2.4 mg/dL Electrolytes WNL  Plan:   No electrolyte replacement indicated today  Reassess tomorrow with AM labs  Benita Gutter 01/07/2020 8:13 AM

## 2020-01-08 DIAGNOSIS — Z515 Encounter for palliative care: Secondary | ICD-10-CM

## 2020-01-08 DIAGNOSIS — Z7189 Other specified counseling: Secondary | ICD-10-CM

## 2020-01-08 DIAGNOSIS — J9601 Acute respiratory failure with hypoxia: Secondary | ICD-10-CM | POA: Diagnosis not present

## 2020-01-08 DIAGNOSIS — J9602 Acute respiratory failure with hypercapnia: Secondary | ICD-10-CM | POA: Diagnosis not present

## 2020-01-08 DIAGNOSIS — E1122 Type 2 diabetes mellitus with diabetic chronic kidney disease: Secondary | ICD-10-CM | POA: Diagnosis not present

## 2020-01-08 DIAGNOSIS — N183 Chronic kidney disease, stage 3 unspecified: Secondary | ICD-10-CM | POA: Diagnosis not present

## 2020-01-08 DIAGNOSIS — J9620 Acute and chronic respiratory failure, unspecified whether with hypoxia or hypercapnia: Secondary | ICD-10-CM | POA: Diagnosis not present

## 2020-01-08 LAB — CBC WITH DIFFERENTIAL/PLATELET
Abs Immature Granulocytes: 0.02 10*3/uL (ref 0.00–0.07)
Basophils Absolute: 0 10*3/uL (ref 0.0–0.1)
Basophils Relative: 0 %
Eosinophils Absolute: 0.1 10*3/uL (ref 0.0–0.5)
Eosinophils Relative: 2 %
HCT: 40.7 % (ref 39.0–52.0)
Hemoglobin: 12.5 g/dL — ABNORMAL LOW (ref 13.0–17.0)
Immature Granulocytes: 1 %
Lymphocytes Relative: 25 %
Lymphs Abs: 1.1 10*3/uL (ref 0.7–4.0)
MCH: 28.7 pg (ref 26.0–34.0)
MCHC: 30.7 g/dL (ref 30.0–36.0)
MCV: 93.6 fL (ref 80.0–100.0)
Monocytes Absolute: 0.9 10*3/uL (ref 0.1–1.0)
Monocytes Relative: 22 %
Neutro Abs: 2.1 10*3/uL (ref 1.7–7.7)
Neutrophils Relative %: 50 %
Platelets: 150 10*3/uL (ref 150–400)
RBC: 4.35 MIL/uL (ref 4.22–5.81)
RDW: 14.1 % (ref 11.5–15.5)
WBC: 4.3 10*3/uL (ref 4.0–10.5)
nRBC: 0 % (ref 0.0–0.2)

## 2020-01-08 LAB — MAGNESIUM: Magnesium: 2.7 mg/dL — ABNORMAL HIGH (ref 1.7–2.4)

## 2020-01-08 LAB — RENAL FUNCTION PANEL
Albumin: 2.6 g/dL — ABNORMAL LOW (ref 3.5–5.0)
Anion gap: 9 (ref 5–15)
BUN: 74 mg/dL — ABNORMAL HIGH (ref 6–20)
CO2: 28 mmol/L (ref 22–32)
Calcium: 8.1 mg/dL — ABNORMAL LOW (ref 8.9–10.3)
Chloride: 106 mmol/L (ref 98–111)
Creatinine, Ser: 2.07 mg/dL — ABNORMAL HIGH (ref 0.61–1.24)
GFR, Estimated: 39 mL/min — ABNORMAL LOW (ref >60)
Glucose, Bld: 108 mg/dL — ABNORMAL HIGH (ref 70–99)
Phosphorus: 4.8 mg/dL — ABNORMAL HIGH (ref 2.5–4.6)
Potassium: 4 mmol/L (ref 3.5–5.1)
Sodium: 143 mmol/L (ref 135–145)

## 2020-01-08 LAB — CREATININE, SERUM
Creatinine, Ser: 2.14 mg/dL — ABNORMAL HIGH (ref 0.61–1.24)
GFR, Estimated: 37 mL/min — ABNORMAL LOW (ref >60)

## 2020-01-08 LAB — GLUCOSE, CAPILLARY
Glucose-Capillary: 95 mg/dL (ref 70–99)
Glucose-Capillary: 99 mg/dL (ref 70–99)
Glucose-Capillary: 110 mg/dL — ABNORMAL HIGH (ref 70–99)
Glucose-Capillary: 102 mg/dL — ABNORMAL HIGH (ref 70–99)
Glucose-Capillary: 144 mg/dL — ABNORMAL HIGH (ref 70–99)
Glucose-Capillary: 94 mg/dL (ref 70–99)

## 2020-01-08 MED ORDER — IPRATROPIUM-ALBUTEROL 0.5-2.5 (3) MG/3ML IN SOLN
3.0000 mL | Freq: Four times a day (QID) | RESPIRATORY_TRACT | Status: DC
  Administered 2020-01-08 – 2020-01-24 (×60): 3 mL via RESPIRATORY_TRACT
  Filled 2020-01-08 (×57): qty 3

## 2020-01-08 NOTE — Progress Notes (Signed)
[place], [place] 01/08/20  Subjective:   Hospital day # 6 Remains intubated cvs: no pressors, regular rhythm Pulm: vent dependent, Fio2 35 % Neuro: propofol + fentanyl; Following simple commands  GI tube feeds 40 cc/hr  Renal: 12/02 0701 - 12/03 0700 In: 1496.6 [I.V.:1246.6; IV Piggyback:250] Out: 2100 [Urine:2100] Lab Results  Component Value Date   CREATININE 2.14 (H) 01/08/2020   CREATININE 2.07 (H) 01/08/2020   CREATININE 2.34 (H) 01/07/2020     Objective:  Vital signs in last 24 hours:  Temp:  [97.3 F (36.3 C)-98.1 F (36.7 C)] 97.7 F (36.5 C) (12/03 1100) Pulse Rate:  [59-83] 70 (12/03 1100) Resp:  [20-28] 25 (12/03 1100) BP: (82-110)/(56-75) 95/67 (12/03 1100) SpO2:  [94 %-100 %] 100 % (12/03 1100) FiO2 (%):  [24 %-40 %] 35 % (12/03 1149) Weight:  [120.7 kg] 120.7 kg (12/03 0358)  Weight change: 0.2 kg Filed Weights   01/06/20 0455 01/07/20 0500 01/08/20 0358  Weight: 118.8 kg 120.5 kg 120.7 kg    Intake/Output:    Intake/Output Summary (Last 24 hours) at 01/08/2020 1353 Last data filed at 01/08/2020 1141 Gross per 24 hour  Intake 1333.44 ml  Output 2250 ml  Net -916.56 ml     Physical Exam: General: Critically ill appearing  HEENT ETT in place  Pulm/lungs Vent assisted  CVS/Heart tachycardic  Abdomen:  soft  Extremities: trace edema  Neurologic: Sedated but able to follow simple commands  Skin: warm   Foley in place with clear yellow urine       Basic Metabolic Panel:  Recent Labs  Lab 01/04/20 0440 01/04/20 0440 01/05/20 0459 01/05/20 0459 01/06/20 0544 01/07/20 0621 01/08/20 0451  NA 143  --  142  --  143 143 143  K 4.5  --  4.2  --  4.0 3.8 4.0  CL 100  --  100  --  101 103 106  CO2 36*  --  33*  --  32 30 28  GLUCOSE 148*  --  91  --  112* 127* 108*  BUN 55*  --  57*  --  67* 74* 74*  CREATININE 2.42*  --  2.25*  --  2.38* 2.34* 2.07*  2.14*  CALCIUM 8.1*   < > 8.3*   < > 8.2* 8.1* 8.1*   MG 2.5*  --  2.6*  --  2.7* 2.9* 2.7*  PHOS 4.1  --  2.2*  --  4.1 5.0* 4.8*   < > = values in this interval not displayed.     CBC: Recent Labs  Lab 01/04/20 0440 01/05/20 0459 01/06/20 0544 01/07/20 0621 01/08/20 0451  WBC 12.9* 7.6 6.2 5.2 4.3  NEUTROABS  --   --  3.8 2.8 2.1  HGB 13.6 13.1 13.1 13.4 12.5*  HCT 44.5 41.3 41.6 43.4 40.7  MCV 92.9 90.0 90.8 92.1 93.6  PLT 197 199 173 153 150      Lab Results  Component Value Date   HEPBSAG NON REACTIVE 01/03/2020   HEPBSAB NON REACTIVE 01/03/2020   HEPBIGM NON REACTIVE 01/03/2020      Microbiology:  Recent Results (from the past 240 hour(s))  Resp Panel by RT-PCR (Flu A&B, Covid) Nasopharyngeal Swab     Status: None   Collection Time: 01/01/20  3:13 PM   Specimen: Nasopharyngeal Swab; Nasopharyngeal(NP) swabs in vial transport medium  Result Value Ref Range Status   SARS Coronavirus 2 by RT PCR NEGATIVE NEGATIVE Final  Comment: (NOTE) SARS-CoV-2 target nucleic acids are NOT DETECTED.  The SARS-CoV-2 RNA is generally detectable in upper respiratory specimens during the acute phase of infection. The lowest concentration of SARS-CoV-2 viral copies this assay can detect is 138 copies/mL. A negative result does not preclude SARS-Cov-2 infection and should not be used as the sole basis for treatment or other patient management decisions. A negative result may occur with  improper specimen collection/handling, submission of specimen other than nasopharyngeal swab, presence of viral mutation(s) within the areas targeted by this assay, and inadequate number of viral copies(<138 copies/mL). A negative result must be combined with clinical observations, patient history, and epidemiological information. The expected result is Negative.  Fact Sheet for Patients:  EntrepreneurPulse.com.au  Fact Sheet for Healthcare Providers:  IncredibleEmployment.be  This test is no t yet  approved or cleared by the [place] FDA and  has been authorized for detection and/or diagnosis of SARS-CoV-2 by FDA under an Emergency Use Authorization (EUA). This EUA will remain  in effect (meaning this test can be used) for the duration of the COVID-19 declaration under Section 564(b)(1) of the Act, 21 U.S.C.section 360bbb-3(b)(1), unless the authorization is terminated  or revoked sooner.       Influenza A by PCR NEGATIVE NEGATIVE Final   Influenza B by PCR NEGATIVE NEGATIVE Final    Comment: (NOTE) The Xpert Xpress SARS-CoV-2/FLU/RSV plus assay is intended as an aid in the diagnosis of influenza from Nasopharyngeal swab specimens and should not be used as a sole basis for treatment. Nasal washings and aspirates are unacceptable for Xpert Xpress SARS-CoV-2/FLU/RSV testing.  Fact Sheet for Patients: EntrepreneurPulse.com.au  Fact Sheet for Healthcare Providers: IncredibleEmployment.be  This test is not yet approved or cleared by the [place] FDA and has been authorized for detection and/or diagnosis of SARS-CoV-2 by FDA under an Emergency Use Authorization (EUA). This EUA will remain in effect (meaning this test can be used) for the duration of the COVID-19 declaration under Section 564(b)(1) of the Act, 21 U.S.C. section 360bbb-3(b)(1), unless the authorization is terminated or revoked.  Performed at [facility], [address] [place], [place] [address]   MRSA PCR Screening     Status: None   Collection Time: 01/02/20  2:45 PM   Specimen: Nasopharyngeal  Result Value Ref Range Status   MRSA by PCR NEGATIVE NEGATIVE Final    Comment:        The GeneXpert MRSA Assay (FDA approved for NASAL specimens only), is one component of a comprehensive MRSA colonization surveillance program. It is not intended to diagnose MRSA infection nor to guide or monitor treatment for MRSA infections. Performed at [facility], [address] [place], [place] [address]   Culture, respiratory     Status: None   Collection Time: 01/02/20  5:00 PM   Specimen: INDUCED SPUTUM  Result Value Ref Range Status   Specimen Description   Final    INDUCED SPUTUM Performed at [facility], [address] [place], [place] [address]    Special Requests   Final    NONE Performed at [facility], [address] [place], [place] [address]    Gram Stain   Final    NO WBC SEEN RARE GRAM POSITIVE COCCI IN PAIRS IN CHAINS    Culture   Final    FEW Normal respiratory flora-no Staph aureus or Pseudomonas seen Performed at [facility], [address], [place], [place] [address]  Report Status 01/05/2020 FINAL  Final  Culture, respiratory     Status: None   Collection Time: 01/04/20 11:14 AM   Specimen: Tracheal Aspirate; Respiratory  Result Value Ref Range Status   Specimen Description   Final    TRACHEAL ASPIRATE Performed at [facility], [address] [place], [place] [address]    Special Requests   Final    NONE Performed at [facility], [address] [place], [place] [address]    Gram Stain   Final    FEW WBC PRESENT, PREDOMINANTLY PMN NO ORGANISMS SEEN    Culture   Final    NO GROWTH 2 DAYS Performed at [facility] [address] [place],  [address]    Report Status 01/07/2020 FINAL  Final    Coagulation Studies: No results for input(s): LABPROT, INR in the last 72 hours.  Urinalysis: No results for input(s): COLORURINE, LABSPEC, PHURINE, GLUCOSEU, HGBUR, BILIRUBINUR, KETONESUR, PROTEINUR, UROBILINOGEN, NITRITE, LEUKOCYTESUR in the last 72 hours.  Invalid input(s): APPERANCEUR    Imaging: DG Chest Port 1 View  Result Date: 01/07/2020 CLINICAL DATA:  Hypoxia EXAM: PORTABLE CHEST 1 VIEW COMPARISON:  Radiograph 01/04/2020 FINDINGS: Endotracheal tube terminates in the mid trachea, 5 cm from the carina. Transesophageal  tube tip and side port terminate below level of imaging, beyond the GE junction. Telemetry leads overlie the chest. There redemonstrated areas of bandlike opacity throughout both lungs with associated bronchiectatic features and architectural distortion most compatible with regions of scarring seen on comparison CT. Low volumes and atelectatic changes are slightly more pronounced than on comparison as well with some developing hazy opacity in the left mid to lower lung and right lung base. Early edema not excluded with central vascular congestion. Cardiomediastinal contours are stable from prior. No acute osseous or soft tissue abnormality. IMPRESSION: 1. Diminished volumes with developing hazy opacity in the left mid to lower lung and right lung base, could atelectatic change versus edema or early infection. Correlate with clinical features. 2. Chronic bilateral scarring and bronchiectatic features. 3. Satisfactory positioning of lines and tubes. Electronically Signed   By: Lovena Le M.D.   On: 01/07/2020 21:11     Medications:   . sodium chloride    . sodium chloride    . famotidine (PEPCID) IV 20 mg (01/08/20 0807)  . fentaNYL infusion INTRAVENOUS 300 mcg/hr (01/08/20 0612)  . levofloxacin (LEVAQUIN) IV Stopped (01/07/20 1854)  . propofol (DIPRIVAN) infusion 35 mcg/kg/min (01/08/20 0806)   . acetylcysteine  4 mL Nebulization BID  . apixaban  5 mg Per Tube BID  . budesonide (PULMICORT) nebulizer solution  0.5 mg Nebulization BID  . chlorhexidine gluconate (MEDLINE KIT)  15 mL Mouth Rinse BID  . Chlorhexidine Gluconate Cloth  6 each Topical Q0600  . docusate  100 mg Per Tube BID  . feeding supplement (PROSource TF)  90 mL Per Tube QID  . feeding supplement (VITAL HIGH PROTEIN)  1,000 mL Per Tube Q24H  . insulin aspart  0-20 Units Subcutaneous TID WC  . ipratropium-albuterol  3 mL Nebulization BID  . mouth rinse  15 mL Mouth Rinse 10 times per day  . multivitamin with minerals  1 tablet  Per Tube Daily  . polyethylene glycol  17 g Per Tube Daily   acetaminophen **OR** acetaminophen, albuterol, fentaNYL, metoprolol tartrate, midazolam, midazolam, ondansetron **OR** ondansetron (ZOFRAN) IV, vecuronium  Assessment/ Plan:  49 y.o. male with     admitted on 01/01/2020 for Shortness  of breath [R06.02] Hypercapnia [R06.89] COPD exacerbation (HCC) [J44.1] Acute respiratory failure with hypoxia and hypercapnia (HCC) [J96.01, J96.02] Acute and chr resp failure, unsp w hypoxia or hypercapnia ([place]) [J96.20]   #. Acute kidney injury on chronic kidney disease stage IIIA with proteinuria: baseline creatinine of 1.41, GFR of 58 on 08/01/19.  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  Acute kidney injury secondary to ATN from hypotension and hypoxia Nonoliguric urine output. No IV contrast exposure.  Lab Results  Component Value Date   CREATININE 2.14 (H) 01/08/2020   CREATININE 2.07 (H) 01/08/2020   CREATININE 2.34 (H) 01/07/2020   - Renal ultrasound- unremarkable , no obstruction  12/02 0701 - 12/03 0700 In: 1496.6 [I.V.:1246.6; IV Piggyback:250] Out: 2100 [Urine:2100]   - S Creatinine fluctuating, likely from hemodynamic changes - No indication for dialysis.  Continue to monitor urine output, renal function, serum electrolytes and volume status.  - continue to hold lisinopril and furosemide  #.   Acute respiratory failure: requiring intubation and mechanical ventilation.  Acute exacerbation of COPD and sarcoidosis. 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  ICU team is planning spontaneous breathing trials  #. Diabetes mellitus type II with chronic kidney disease: insulin dependent.  Lab Results  Component Value Date   HGBA1C 6.5 (H) 01/02/2020  UPC 0.47    LOS: 6 [place] 12/3/20211:53 PM  [place], [place]  Note: This note was prepared with Dragon  dictation. Any transcription errors are unintentional

## 2020-01-08 NOTE — Consult Note (Signed)
PHARMACY CONSULT NOTE  Pharmacy Consult for Electrolyte Monitoring and Replacement   Recent Labs: Potassium (mmol/L)  Date Value  01/08/2020 4.0  05/28/2014 3.9   Magnesium (mg/dL)  Date Value  01/08/2020 2.7 (H)  05/14/2013 1.8   Calcium (mg/dL)  Date Value  01/08/2020 8.1 (L)   Calcium, Total (mg/dL)  Date Value  05/28/2014 8.4 (L)   Albumin (g/dL)  Date Value  01/08/2020 2.6 (L)  06/01/2013 3.8   Phosphorus (mg/dL)  Date Value  01/08/2020 4.8 (H)  03/09/2013 3.0   Sodium (mmol/L)  Date Value  01/08/2020 143  05/28/2014 140   Corrected Ca: 8.3 mg/dL  Assessment: 49 year old male with PMHx of HTN, DM, COPD, CAD, hx CVA, sarcoidosis, CHF, afib on Eliquis, CKD 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 The patient failed an extubation trial this morning.  12/1: Urine output 2150 mL yesterday 12/2: Urine output 1475 mL yesterday 12/3: Urine output 2100 mL yesterday  Goal of Therapy:  Potassium 4.0 - 5.1 mmol/L Magnesium 2.0 - 2.4 mg/dL Electrolytes WNL  Plan:   No electrolyte replacement indicated today  Reassess tomorrow with AM labs  Benita Gutter 01/08/2020 7:54 AM

## 2020-01-08 NOTE — Progress Notes (Signed)
Follow up - Critical Care Medicine Note  Patient Details:    [name] is an 49 y.o. 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 Previous h/o [facility] AND LTACH, admitted with acute on chronic mixed respiratory failure requiring intubation PCCM consulted.  Lines, Airways, Drains: Airway 8 mm (Active)  Secured at (cm) 26 cm 01/08/20 1600  Measured From Lips 01/08/20 1600  Secured Location Left 01/08/20 1504  Secured By [employer] 01/08/20 1600  Tube Holder Repositioned Yes 01/08/20 1504  Prone position No 01/08/20 1504  Head position Right 01/05/20 0440  Cuff Pressure (cm H2O) 32 cm H2O 01/08/20 1504  Site Condition Cool;Dry 01/08/20 1600     NG/OG Tube Orogastric Left mouth Xray Documented cm marking at nare/ corner of mouth 65 cm (Active)  Cm Marking at Nare/Corner of Mouth (if applicable) 65 cm 28/63/81 0800  Site Assessment Clean;Dry;Intact 01/08/20 0800  Ongoing Placement Verification No change in cm markings or external length of tube from initial placement 01/08/20 0800  Status Infusing tube feed 01/08/20 0800  Output (mL) 60 mL 01/06/20 0957     Urethral Catheter Reina RN Temperature probe 16 Fr. (Active)  Indication for Insertion or Continuance of Catheter Therapy based on hourly urine output monitoring and documentation for critical condition (NOT STRICT I&O) 01/08/20 0800  Site Assessment Clean;Intact 01/08/20 0800  Catheter Maintenance Bag below level of bladder;Catheter secured;Drainage bag/tubing not touching floor;Insertion date on drainage bag;No dependent loops;Seal intact 01/08/20 0800  Collection Container Standard drainage bag 01/08/20 0800  Securement Method Securing device (Describe) 01/08/20 0800   Urinary Catheter Interventions (if applicable) Unclamped 77/11/65 0800  Output (mL) 175 mL 01/08/20 1750    Anti-infectives:  Anti-infectives (From admission, onward)   Start     Dose/Rate Route Frequency Ordered Stop   01/05/20 1800  levofloxacin (LEVAQUIN) IVPB 750 mg        750 mg 100 mL/hr over 90 Minutes Intravenous Every 24 hours 01/05/20 1228 01/09/20 1759   01/04/20 1800  levofloxacin (LEVAQUIN) IVPB 750 mg  Status:  Discontinued        750 mg 100 mL/hr over 90 Minutes Intravenous Every 48 hours 01/03/20 0832 01/05/20 1228   01/02/20 1800  levofloxacin (LEVAQUIN) IVPB 500 mg  Status:  Discontinued        500 mg 100 mL/hr over 60 Minutes Intravenous Every 24 hours 01/02/20 1613 01/03/20 0832     Scheduled Meds: . acetylcysteine  4 mL Nebulization BID  . apixaban  5 mg Per Tube BID  . budesonide (PULMICORT) nebulizer solution  0.5 mg Nebulization BID  . chlorhexidine gluconate (MEDLINE KIT)  15 mL Mouth Rinse BID  . Chlorhexidine Gluconate Cloth  6 each Topical Q0600  . docusate  100 mg Per Tube BID  . feeding supplement (PROSource TF)  90 mL Per Tube QID  . feeding supplement (VITAL HIGH PROTEIN)  1,000 mL Per Tube Q24H  . insulin aspart  0-20 Units Subcutaneous TID WC  . ipratropium-albuterol  3 mL Nebulization BID  . mouth rinse  15 mL Mouth Rinse 10 times per day  . multivitamin with minerals  1 tablet Per Tube Daily  . polyethylene glycol  17 g Per Tube Daily   Continuous Infusions: . sodium chloride    . sodium chloride    .  famotidine (PEPCID) IV Stopped (01/08/20 2094)  . fentaNYL infusion INTRAVENOUS 150 mcg/hr (01/08/20 1612)  . levofloxacin (LEVAQUIN) IV 750 mg (01/08/20 1742)  . propofol (DIPRIVAN) infusion 30 mcg/kg/min (01/08/20 1554)   PRN Meds:.acetaminophen **OR** acetaminophen, albuterol, fentaNYL, metoprolol tartrate, midazolam, midazolam, ondansetron **OR** ondansetron (ZOFRAN) IV, vecuronium    Microbiology: Results for orders placed or  performed during the hospital encounter of 01/01/20  Resp Panel by RT-PCR (Flu A&B, Covid) Nasopharyngeal Swab     Status: None   Collection Time: 01/01/20  3:13 PM   Specimen: Nasopharyngeal Swab; Nasopharyngeal(NP) swabs in vial transport medium  Result Value Ref Range Status   SARS Coronavirus 2 by RT PCR NEGATIVE NEGATIVE Final    Comment: (NOTE) SARS-CoV-2 target nucleic acids are NOT DETECTED.  The SARS-CoV-2 RNA is generally detectable in upper respiratory specimens during the acute phase of infection. The lowest concentration of SARS-CoV-2 viral copies this assay can detect is 138 copies/mL. A negative result does not preclude SARS-Cov-2 infection and should not be used as the sole basis for treatment or other patient management decisions. A negative result may occur with  improper specimen collection/handling, submission of specimen other than nasopharyngeal swab, presence of viral mutation(s) within the areas targeted by this assay, and inadequate number of viral copies(<138 copies/mL). A negative result must be combined with clinical observations, patient history, and epidemiological information. The expected result is Negative.  Fact Sheet for Patients:  EntrepreneurPulse.com.au  Fact Sheet for Healthcare Providers:  IncredibleEmployment.be  This test is no t yet approved or cleared by the [place] FDA and  has been authorized for detection and/or diagnosis of SARS-CoV-2 by FDA under an Emergency Use Authorization (EUA). This EUA will remain  in effect (meaning this test can be used) for the duration of the COVID-19 declaration under Section 564(b)(1) of the Act, 21 U.S.C.section 360bbb-3(b)(1), unless the authorization is terminated  or revoked sooner.       Influenza A by PCR NEGATIVE NEGATIVE Final   Influenza B by PCR NEGATIVE NEGATIVE Final    Comment: (NOTE) The Xpert Xpress SARS-CoV-2/FLU/RSV plus assay is intended as  an aid in the diagnosis of influenza from Nasopharyngeal swab specimens and should not be used as a sole basis for treatment. Nasal washings and aspirates are unacceptable for Xpert Xpress SARS-CoV-2/FLU/RSV testing.  Fact Sheet for Patients: EntrepreneurPulse.com.au  Fact Sheet for Healthcare Providers: IncredibleEmployment.be  This test is not yet approved or cleared by the [place] FDA and has been authorized for detection and/or diagnosis of SARS-CoV-2 by FDA under an Emergency Use Authorization (EUA). This EUA will remain in effect (meaning this test can be used) for the duration of the COVID-19 declaration under Section 564(b)(1) of the Act, 21 U.S.C. section 360bbb-3(b)(1), unless the authorization is terminated or revoked.  Performed at [facility], [address] [place], [place] [address]   MRSA PCR Screening     Status: None   Collection Time: 01/02/20  2:45 PM   Specimen: Nasopharyngeal  Result Value Ref Range Status   MRSA by PCR NEGATIVE NEGATIVE Final    Comment:        The GeneXpert MRSA Assay (FDA approved for NASAL specimens only), is one component of a comprehensive MRSA colonization surveillance program. It is not intended to diagnose MRSA infection nor to guide or monitor treatment for MRSA infections. Performed at [facility], [address] [place], [place] [address]   Culture, respiratory     Status: None  Collection Time: 01/02/20  5:00 PM   Specimen: INDUCED SPUTUM  Result Value Ref Range Status   Specimen Description   Final    INDUCED SPUTUM Performed at [facility], [address] [place], [place] [address]    Special Requests   Final    NONE Performed at [facility], [address] [place], [place] [address]    Gram Stain   Final    NO WBC SEEN RARE GRAM POSITIVE COCCI IN PAIRS IN CHAINS    Culture   Final    FEW Normal respiratory flora-no  Staph aureus or Pseudomonas seen Performed at [facility], [address], [place], [place] [address]    Report Status 01/05/2020 FINAL  Final  Culture, respiratory     Status: None   Collection Time: 01/04/20 11:14 AM   Specimen: Tracheal Aspirate; Respiratory  Result Value Ref Range Status   Specimen Description   Final    TRACHEAL ASPIRATE Performed at [facility], [address] [place], [place] [address]    Special Requests   Final    NONE Performed at [facility], [address] [place], [place] [address]    Gram Stain   Final    FEW WBC PRESENT, PREDOMINANTLY PMN NO ORGANISMS SEEN    Culture   Final    NO GROWTH 2 DAYS Performed at [facility] [address] [place],  [address]    Report Status 01/07/2020 FINAL  Final     Results for orders placed or performed during the hospital encounter of 01/01/20 (from the past 24 hour(s))  Glucose, capillary     Status: None   Collection Time: 01/07/20  7:48 PM  Result Value Ref Range   Glucose-Capillary 97 70 - 99 mg/dL  Glucose, capillary     Status: Abnormal   Collection Time: 01/07/20 11:28 PM  Result Value Ref Range   Glucose-Capillary 120 (H) 70 - 99 mg/dL  Glucose, capillary     Status: None   Collection Time: 01/08/20  3:31 AM  Result Value Ref Range   Glucose-Capillary 95 70 - 99 mg/dL  Glucose, capillary     Status: None   Collection Time: 01/08/20  3:46 AM  Result Value Ref Range   Glucose-Capillary 99 70 - 99 mg/dL  Creatinine, serum     Status: Abnormal   Collection Time: 01/08/20  4:51 AM  Result Value Ref Range   Creatinine, Ser 2.14 (H) 0.61 - 1.24 mg/dL   GFR, Estimated 37 (L) >60 mL/min  CBC with Differential/Platelet     Status: Abnormal   Collection Time: 01/08/20  4:51 AM  Result Value Ref Range   WBC 4.3 4.0 - 10.5 K/uL   RBC 4.35 4.22 - 5.81 MIL/uL   Hemoglobin 12.5 (L) 13.0 - 17.0 g/dL   HCT 40.7 39 - 52 %   MCV 93.6 80.0 - 100.0 fL   MCH 28.7  26.0 - 34.0 pg   MCHC 30.7 30.0 - 36.0 g/dL   RDW 14.1 11.5 - 15.5 %   Platelets 150 150 - 400 K/uL   nRBC 0.0 0.0 - 0.2 %   Neutrophils Relative % 50 %   Neutro Abs 2.1 1.7 - 7.7 K/uL   Lymphocytes Relative 25 %   Lymphs Abs 1.1 0.7 - 4.0 K/uL   Monocytes Relative 22 %   Monocytes Absolute 0.9 0.1 - 1.0 K/uL   Eosinophils Relative 2 %  Eosinophils Absolute 0.1 0.0 - 0.5 K/uL   Basophils Relative 0 %   Basophils Absolute 0.0 0.0 - 0.1 K/uL   Immature Granulocytes 1 %   Abs Immature Granulocytes 0.02 0.00 - 0.07 K/uL  Magnesium     Status: Abnormal   Collection Time: 01/08/20  4:51 AM  Result Value Ref Range   Magnesium 2.7 (H) 1.7 - 2.4 mg/dL  Renal function panel     Status: Abnormal   Collection Time: 01/08/20  4:51 AM  Result Value Ref Range   Sodium 143 135 - 145 mmol/L   Potassium 4.0 3.5 - 5.1 mmol/L   Chloride 106 98 - 111 mmol/L   CO2 28 22 - 32 mmol/L   Glucose, Bld 108 (H) 70 - 99 mg/dL   BUN 74 (H) 6 - 20 mg/dL   Creatinine, Ser 2.07 (H) 0.61 - 1.24 mg/dL   Calcium 8.1 (L) 8.9 - 10.3 mg/dL   Phosphorus 4.8 (H) 2.5 - 4.6 mg/dL   Albumin 2.6 (L) 3.5 - 5.0 g/dL   GFR, Estimated 39 (L) >60 mL/min   Anion gap 9 5 - 15  Glucose, capillary     Status: Abnormal   Collection Time: 01/08/20  7:38 AM  Result Value Ref Range   Glucose-Capillary 110 (H) 70 - 99 mg/dL  Glucose, capillary     Status: Abnormal   Collection Time: 01/08/20 11:26 AM  Result Value Ref Range   Glucose-Capillary 102 (H) 70 - 99 mg/dL  Glucose, capillary     Status: Abnormal   Collection Time: 01/08/20  3:29 PM  Result Value Ref Range   Glucose-Capillary 144 (H) 70 - 99 mg/dL      Best Practice/Protocols:  VTE Prophylaxis: Direct Thrombin Inhibitor GI Prophylaxis: Antihistamine Continous Sedation Weaning  Events: 11/27 ICU admission, INTUBATED #8ETT 11/28 severe resp failure, progressive Renal failure, Nephrology consulted 11/29 - patient passed SBT today, able to communicate/follow  directions while intubated status post extubation to BIPAP - after 1 hour on BIPAP patient became unresponsive and required re-intubation. I have met with mother in law at bedside and reviewed careplan.  11/30- patient remains on ventilator.  He has thickened mucopurulent tracheal secretions, he can follow verbal instructions but failed SBT today. GPC+ tracheal aspirate culture. 01/06/20- SBT for extubation, failed with heavy secretions.  TOC consult today.  01/07/20- patient failed SBT after 10 minutes with acute hypoxemia.  PALS consult for VDRF.  Spoke with Wife Sharl Ma about trache she wants to wait for several days 01/08/20: Palliative care discussing with family, likelihood patient will remain ventilator dependent, continues to fail SBT's has had prior episode of trach and LTACH stay.  Studies: DG Chest 2 View  Result Date: 01/01/2020 CLINICAL DATA:  Chest pain EXAM: CHEST - 2 VIEW COMPARISON:  07/31/2019 FINDINGS: Linear areas of scarring within the lungs bilaterally. Heart is normal size. No acute confluent opacities or effusions. No acute bony abnormality. IMPRESSION: Areas of scarring bilaterally, stable.  No active disease. Electronically Signed   By: Rolm Baptise M.D.   On: 01/01/2020 11:42   DG Abd 1 View  Result Date: 01/04/2020 CLINICAL DATA:  Status post orogastric tube placement. EXAM: ABDOMEN - 1 VIEW COMPARISON:  February 11, 2015. FINDINGS: The bowel gas pattern is normal. Distal tip of enteric tube is seen in proximal stomach. No radio-opaque calculi or other significant radiographic abnormality are seen. IMPRESSION: Distal tip of enteric tube seen in proximal stomach. Electronically Signed   By: Sabino Dick  Jr M.D.   On: 01/04/2020 11:39   US RENAL  Result Date: 01/03/2020 CLINICAL DATA:  Acute kidney failure EXAM: RENAL ULTRASOUND COMPARISON:  None. FINDINGS: Right Kidney: Renal measurements: 9.7 x 7.0 x 5.4 cm = volume: 193 mL. Echogenicity and renal cortical thickness are  within normal limits. No mass, perinephric fluid, or hydronephrosis visualized. No sonographically demonstrable calculus or ureterectasis. Left Kidney: Renal measurements: 9.9 x 4.9 x 6.1 cm = volume: 158 mL. Echogenicity and renal cortical thickness are within normal limits. No mass, perinephric fluid, or hydronephrosis visualized. No sonographically demonstrable calculus or ureterectasis. Bladder: Foley catheter in place. Urinary bladder decompressed and cannot be assessed. Other: None. IMPRESSION: Kidneys appear unremarkable bilaterally. Urinary bladder decompressed with Foley catheter and cannot be assessed at this time. Electronically Signed   By: Lowella Grip III M.D.   On: 01/03/2020 11:11   DG Chest Port 1 View  Result Date: 01/07/2020 CLINICAL DATA:  Hypoxia EXAM: PORTABLE CHEST 1 VIEW COMPARISON:  Radiograph 01/04/2020 FINDINGS: Endotracheal tube terminates in the mid trachea, 5 cm from the carina. Transesophageal tube tip and side port terminate below level of imaging, beyond the GE junction. Telemetry leads overlie the chest. There redemonstrated areas of bandlike opacity throughout both lungs with associated bronchiectatic features and architectural distortion most compatible with regions of scarring seen on comparison CT. Low volumes and atelectatic changes are slightly more pronounced than on comparison as well with some developing hazy opacity in the left mid to lower lung and right lung base. Early edema not excluded with central vascular congestion. Cardiomediastinal contours are stable from prior. No acute osseous or soft tissue abnormality. IMPRESSION: 1. Diminished volumes with developing hazy opacity in the left mid to lower lung and right lung base, could atelectatic change versus edema or early infection. Correlate with clinical features. 2. Chronic bilateral scarring and bronchiectatic features. 3. Satisfactory positioning of lines and tubes. Electronically Signed   By: Lovena Le  M.D.   On: 01/07/2020 21:11   DG Chest Port 1 View  Result Date: 01/04/2020 CLINICAL DATA:  Status post intubation. EXAM: PORTABLE CHEST 1 VIEW COMPARISON:  Same day. FINDINGS: The heart size and mediastinal contours are within normal limits. Endotracheal and nasogastric tubes are unchanged in position. No pneumothorax is noted. Stable bilateral lung opacities are noted most consistent with scarring, with probable small left pleural effusion. The visualized skeletal structures are unremarkable. IMPRESSION: Stable support apparatus. Stable bilateral lung opacities are noted most consistent with scarring, with probable small left pleural effusion. Electronically Signed   By: Marijo Conception M.D.   On: 01/04/2020 11:38   DG Chest Port 1 View  Result Date: 01/04/2020 CLINICAL DATA:  Respiratory failure EXAM: PORTABLE CHEST 1 VIEW COMPARISON:  01/03/2020 FINDINGS: Endotracheal tube is seen 5.1 cm above the carina. Nasogastric tube extends into the upper abdomen beyond the margin of the examination. Linear scarring within the right mid lung zone and left lung base are again identified. Mild asymmetric left-sided volume loss is unchanged. Tiny left pleural effusion or pleural scarring is seen at the left costophrenic angle. No superimposed confluent pulmonary infiltrate. No pneumothorax. Cardiac size within normal limits. Pulmonary vascularity is normal. IMPRESSION: Stable support tubes. Unchanged examination with multifocal regions of parenchymal scarring and tiny left pleural effusion or pleural scarring. Electronically Signed   By: Fidela Salisbury MD   On: 01/04/2020 04:55   DG Chest Port 1 View  Result Date: 01/03/2020 CLINICAL DATA:  Intubation EXAM: PORTABLE CHEST 1  VIEW COMPARISON:  01/02/2020 FINDINGS: Endotracheal and enteric tubes are unchanged in position. Shallow inspiration with atelectasis in the lung bases. Blunting of the costophrenic angles suggests small effusions. Linear scarring in the  right lung. No significant change. IMPRESSION: Stable appearance of the chest. Electronically Signed   By: Lucienne Capers M.D.   On: 01/03/2020 05:46   DG Chest Portable 1 View  Result Date: 01/02/2020 CLINICAL DATA:  OG placement. EXAM: PORTABLE CHEST 1 VIEW COMPARISON:  January 01, 2020 FINDINGS: Endotracheal tube in satisfactory position. Enteric catheter with side hole at the expected location of the GE junction. Cardiomediastinal silhouette is normal. Mediastinal contours appear intact. Chronic abnormal appearance of the lungs with bilateral areas of peribronchial scarring/atelectasis. Osseous structures are without acute abnormality. Soft tissues are grossly normal. IMPRESSION: 1. Endotracheal tube in satisfactory position. 2. Enteric catheter with side hole at the expected location of the GE junction. Advancement with may be considered. 3. Stable abnormal appearance of the lungs with peribronchial areas of scarring/atelectasis and chronic elevation of the left hemidiaphragm. Electronically Signed   By: Fidela Salisbury M.D.   On: 01/02/2020 13:57    Consults:    Subjective:    Overnight Issues: Overnight no issues.  Remains ventilator dependent.  Objective:  Vital signs for last 24 hours: Temp:  [97.3 F (36.3 C)-98.2 F (36.8 C)] 98.1 F (36.7 C) (12/03 1700) Pulse Rate:  [59-120] 80 (12/03 1700) Resp:  [14-40] 28 (12/03 1700) BP: (82-166)/(56-98) 106/71 (12/03 1700) SpO2:  [86 %-100 %] 96 % (12/03 1700) FiO2 (%):  [25 %-40 %] 35 % (12/03 1504) Weight:  [120.7 kg] 120.7 kg (12/03 0358)  Hemodynamic parameters for last 24 hours:    Intake/Output from previous day: 12/02 0701 - 12/03 0700 In: 1496.6 [I.V.:1246.6; IV Piggyback:250] Out: 2100 [Urine:2100]  Intake/Output this shift: Total I/O In: 314.7 [I.V.:264.7; IV Piggyback:50] Out: 1150 [Urine:1150]  Vent settings for last 24 hours: Vent Mode: PRVC FiO2 (%):  [25 %-40 %] 35 % Set Rate:  [8 bmp-28 bmp] 28  bmp Vt Set:  [500 mL] 500 mL PEEP:  [5 cmH20] 5 cmH20 Pressure Support:  [10 cmH20] 10 cmH20 Plateau Pressure:  [18 cmH20-22 cmH20] 22 cmH20  Physical Exam:  GENERAL: Obese male, intubated, sedated, mechanically ventilated HEAD: Normocephalic, atraumatic.  EYES: Pupils equal, round, reactive to light.  No scleral icterus.  MOUTH: Orotracheally intubated, OG in place. NECK: Supple, trachea midline. PULMONARY: Diffuse rhonchi throughout. CARDIOVASCULAR: S1 and S2. Regular rate and rhythm. No murmurs, rubs, or gallops.  GASTROINTESTINAL: Soft, non-distended.  Positive bowel sounds.   MUSCULOSKELETAL: +1-2 anasarca, no clubbing NEUROLOGIC: Sedated, cannot make further assessment SKIN:intact,warm,dry.  No skin issues noted.  Nutrition Status: Nutrition Problem: Inadequate oral intake Etiology: inability to eat Signs/Symptoms: NPO status Interventions: Tube feeding, Prostat, MVI   Assessment/Plan:  Synopsis: 49 yo AAM with severe morbid obesity with biventricular heart failure with acute on chronic resp failue with hypoxia and hypercapnia due to acute COPD exacerbation with acute CAP with sepsis, present on admission, with severe metabolic encephalopathy from hypoxia and hypercapnia with progressive renal failure.  Required intubation.  Acute on chronic respiratory failure (hypoxia/hypercarbia) Underlying COPD, severe and sarcoidosis Atelectasis Continue ventilator support Likelihood of weaning is slim given prior issues with respiratory failure prior trach and LTACH End-of-life discussions with family, palliative care involved Has failed spontaneous breathing trials multiple times Discussion of tracheostomy versus transitioning to comfort care Initiated recruitment maneuvers for atelectasis Continue bronchodilators  Severe obstructive sleep apnea Chronic  hypercapnic respiratory failure Noncompliant with noninvasive ventilator (Trilogy) at home This issue adds complexity to his  management  Acute kidney injury on chronic kidney disease stage IIIa ATN/hypotension/hypoxia culprits Nephrology following appreciate input Avoid nephrotoxins when able Has nonoliguric urine output No indication for dialysis at present Continue to monitor Continue to hold ACE I and furosemide Foley catheter indicated currently, assess need on daily basis  Diabetes mellitus type 2 with chronic kidney disease Insulin requiring ICU hyperglycemia protocol    LOS: 6 days   Additional comments: Discussed during multidisciplinary rounds with ICU team  Critical Care Total Time*: 40 Minutes  C. Derrill Kay, MD [facility] 01/08/2020  *Care during the described time interval was provided by me and/or other providers on the critical care team.  I have reviewed this patient's available data, including medical history, events of note, physical examination and test results as part of my evaluation.  **This note was dictated using voice recognition software/Dragon.  Despite best efforts to proofread, errors can occur which can change the meaning.  Any change was purely unintentional.

## 2020-01-08 NOTE — Progress Notes (Signed)
Held sedation at 1130, pt was awake, following commands, performed SBT, pt tolerated for approximately 2 hours.  Became tachycardic and anxious with frequent ectopy, vent set back to original settings, previous sedation resumed at 50%, RT updated.    Pt responding well to sedation.  Will continue to monitor.

## 2020-01-08 NOTE — Consult Note (Signed)
Consultation Note Date: 01/08/2020   Patient Name: [name]  DOB: 1970-07-15  MRN: [id number]  Age / Sex: 49 y.o., male  PCP: [facility] Referring Physician: Flora Lipps, MD  Reason for Consultation: Establishing goals of care  HPI/Patient Profile: 49 y.o. male  with past medical history of chronic combined systolic and diastolic CHF, COPD, CAD, MI, type 2 diabetes, hyperlipidemia, hypertension, sarcoidosis, sleep apnea not using CPAP regularly, and nonhemorrhagic CVA  admitted on 01/01/2020 with respiratory failure. Also has a history of trach and LTACH stay. Patient required intubation 11/27. He was extubated 11/29 but required reintubation 1 hour later. Has failed SBTs last several days. PMT consulted to discuss [place].   Clinical Assessment and Goals of Care: I have reviewed medical records including EPIC notes, labs and imaging, received report from RN, assessed the patient and then spoke with spouse [name]  to discuss diagnosis prognosis, GOC, EOL wishes, disposition and options.  I first met with patient - awake on ventilator. Nods head appropriately. Sedation was stopped at time of my evaluation but had not been off of sedation long. Patient indicates he wants to know about his situation so I provided him an update. I ask him if I can discuss his care with his wife and he nods yes. I ask him if he has ever discussed his wishes about his medical care with his wife and he nods emphatically.   I introduced Palliative Medicine as specialized medical care for people living with serious illness. It focuses on providing relief from the symptoms and stress of a serious illness. The goal is to improve quality of life for both the patient and the family.  [name] shares that she and the patient have been married for 19 years. He has one biological daughter and 2 stepchildren. He also has 8 grandchildren including a new baby born  in November that he has not yet met.   [name] shares about patient's hospitalization in June d/t COPD - did not require intubation. She also shares about his trach/LTACH stay following an MI. Tells me this was about 8 years ago. He was at [facility] for ?28 days. She tells me that she was told at that time that he would never come off the ventilator.    We discussed patient's current illness and what it means in the larger context of patient's on-going co-morbidities. Discussed concerns about patient's respiratory status. Discussed severity of his disease. Discussed concern that he may not be able to come off ventilator. Discussed his other conditions of severe heart failure and sarcoidosis and the roles they play in his severe illness.   I attempted to elicit values and goals of care important to the patient. [name] tells me that the patient has told her "keep me on the vent unless I'm brain dead". [name] also shares that she has been told before that patient would never come off the vent and then he did. These two things lead [name] to desire trach and LTACH placement if needed.    She does share that she does not want patient to suffer. She shares that she will continue to consider options and is open to further goals of care discussions in the future.   She shares that she will make decisions with patient's biological daughter. She tells me his daughter is coming to visit hospital Sunday - she asks that provider update daughter and explain severity of situation.   We did review code status - [name] requests full  code.   Discussed with [name] the importance of continued conversation with family and the medical providers regarding overall plan of care and treatment options, ensuring decisions are within the context of the patient's values and GOCs.    Questions and concerns were addressed. The family was encouraged to call with questions or concerns.   Primary Decision Maker NEXT OF KIN  - spouse [name]  SUMMARY OF RECOMMENDATIONS   - full code/full scope - at this time [name] would agree to trach/LTACH placement if patient continues to not improve in coming days - **patient's daughter to visit Sunday - [name] requests provider explain severity of situation to daughter - will follow  Code Status/Advance Care Planning:  Full code  Additional Recommendations (Limitations, Scope, Preferences):  Full Scope Treatment  Prognosis:   Unable to determine  Discharge Planning: To Be Determined      Primary Diagnoses: Present on Admission: . Acute respiratory failure with hypoxia and hypercapnia (HCC) . HTN (hypertension) . Chronic combined systolic and diastolic congestive heart failure ([place]) . OSA and COPD overlap syndrome ([place]) . COPD exacerbation ([place]) . CKD stage 3 due to type 2 diabetes mellitus ([place]) . Hypercholesteremia . Acute and chr resp failure, unsp w hypoxia or hypercapnia ([place])   I have reviewed the medical record, interviewed the patient and family, and examined the patient. The following aspects are pertinent.  Past Medical History:  Diagnosis Date  . CHF (congestive heart failure) (HCC)    EF 45-50%- 2020  . CKD (chronic kidney disease) stage 3, GFR 30-59 ml/min (HCC)   . Clear cell renal cell carcinoma (HCC)    s/p partial nephrectomy  . COPD (chronic obstructive pulmonary disease) ([place])   . Coronary artery disease   . Diabetes mellitus without complication ([place])   . Hypercholesteremia unk  . Hypertension   . Myocardial infarction ([place])   . Sarcoidosis   . Sleep apnea    does not use CPAP regularly.  . Stroke [facility])    Social History   Socioeconomic History  . Marital status: Married    Spouse name: Not on file  . Number of children: Not on file  . Years of education: Not on file  . Highest education level: Not on file  Occupational History  . Occupation: disabled  Tobacco Use  . Smoking status: Current Every Day Smoker     Packs/day: 0.25    Years: 25.00    Pack years: 6.25    Types: Cigarettes    Last attempt to quit: 02/05/2015    Years since quitting: 4.9  . Smokeless tobacco: Never Used  Vaping Use  . Vaping Use: Never used  Substance and Sexual Activity  . Alcohol use: Not Currently    Alcohol/week: 0.0 standard drinks    Comment: occassional  . Drug use: Yes    Types: Marijuana, PCP    Comment: 4 weeks  . Sexual activity: Yes  Other Topics Concern  . Not on file  Social History Narrative  . Not on file   Social Determinants of Health   Financial Resource Strain:   . Difficulty of Paying Living Expenses: Not on file  Food Insecurity:   . Worried About Charity fundraiser in the Last Year: Not on file  . Ran Out of Food in the Last Year: Not on file  Transportation Needs:   . Lack of Transportation (Medical): Not on file  . Lack of Transportation (Non-Medical): Not on file  Physical Activity:   .  Days of Exercise per Week: Not on file  . Minutes of Exercise per Session: Not on file  Stress:   . Feeling of Stress : Not on file  Social Connections:   . Frequency of Communication with Friends and Family: Not on file  . Frequency of Social Gatherings with Friends and Family: Not on file  . Attends Religious Services: Not on file  . Active Member of Clubs or Organizations: Not on file  . Attends Archivist Meetings: Not on file  . Marital Status: Not on file   Family History  Problem Relation Age of Onset  . Hypertension Mother   . Heart disease Mother   . Diabetes Mother   . Cancer Father    Scheduled Meds: . acetylcysteine  4 mL Nebulization BID  . apixaban  5 mg Per Tube BID  . budesonide (PULMICORT) nebulizer solution  0.5 mg Nebulization BID  . chlorhexidine gluconate (MEDLINE KIT)  15 mL Mouth Rinse BID  . Chlorhexidine Gluconate Cloth  6 each Topical Q0600  . docusate  100 mg Per Tube BID  . feeding supplement (PROSource TF)  90 mL Per Tube QID  . feeding  supplement (VITAL HIGH PROTEIN)  1,000 mL Per Tube Q24H  . insulin aspart  0-20 Units Subcutaneous TID WC  . ipratropium-albuterol  3 mL Nebulization BID  . mouth rinse  15 mL Mouth Rinse 10 times per day  . multivitamin with minerals  1 tablet Per Tube Daily  . polyethylene glycol  17 g Per Tube Daily   Continuous Infusions: . sodium chloride    . sodium chloride    . famotidine (PEPCID) IV Stopped (01/08/20 2595)  . fentaNYL infusion INTRAVENOUS 150 mcg/hr (01/08/20 1612)  . levofloxacin (LEVAQUIN) IV 750 mg (01/08/20 1742)  . propofol (DIPRIVAN) infusion 30 mcg/kg/min (01/08/20 1554)   PRN Meds:.acetaminophen **OR** acetaminophen, albuterol, fentaNYL, metoprolol tartrate, midazolam, midazolam, ondansetron **OR** ondansetron (ZOFRAN) IV, vecuronium Allergies  Allergen Reactions  . Penicillins Anaphylaxis and Other (See Comments)    Has patient had a PCN reaction causing immediate rash, facial/tongue/throat swelling, SOB or lightheadedness with hypotension: Yes Has patient had a PCN reaction causing severe rash involving mucus membranes or skin necrosis: No Has patient had a PCN reaction that required hospitalization: No Has patient had a PCN reaction occurring within the last 10 years: No If all of the above answers are "NO", then may proceed with Cephalosporin use.    Review of Systems  Unable to perform ROS: Intubated    Physical Exam Constitutional:      General: He is not in acute distress. Skin:    General: Skin is warm and dry.  Neurological:     Mental Status: He is alert.     Comments: Follows commands, nods head appropriately     Vital Signs: BP 106/71   Pulse 80   Temp 98.1 F (36.7 C)   Resp (!) 28   Ht 6' 0.99" (1.854 m)   Wt 120.7 kg   SpO2 96%   BMI 35.11 kg/m  Pain Scale: 0-10   Pain Score: 0-No pain   SpO2: SpO2: 96 % O2 Device:SpO2: 96 % O2 Flow Rate: .O2 Flow Rate (L/min): 3 L/min  IO: Intake/output summary:   Intake/Output Summary  (Last 24 hours) at 01/08/2020 1754 Last data filed at 01/08/2020 1750 Gross per 24 hour  Intake 1648.14 ml  Output 2575 ml  Net -926.86 ml    LBM: Last BM Date:  (unknown)  Baseline Weight: Weight: 123.9 kg Most recent weight: Weight: 120.7 kg     Palliative Assessment/Data: PPS 30%    Time Total: 60 minutes Greater than 50%  of this time was spent counseling and coordinating care related to the above assessment and plan.  Juel Burrow, DNP, AGNP-C Palliative Medicine Team [phone] Pager: [phone]

## 2020-01-09 ENCOUNTER — Inpatient Hospital Stay: Payer: Medicare HMO

## 2020-01-09 DIAGNOSIS — J9601 Acute respiratory failure with hypoxia: Secondary | ICD-10-CM | POA: Diagnosis not present

## 2020-01-09 DIAGNOSIS — J9602 Acute respiratory failure with hypercapnia: Secondary | ICD-10-CM | POA: Diagnosis not present

## 2020-01-09 LAB — CBC WITH DIFFERENTIAL/PLATELET
Abs Immature Granulocytes: 0.03 10*3/uL (ref 0.00–0.07)
Basophils Absolute: 0 10*3/uL (ref 0.0–0.1)
Basophils Relative: 0 %
Eosinophils Absolute: 0.1 10*3/uL (ref 0.0–0.5)
Eosinophils Relative: 2 %
HCT: 43.3 % (ref 39.0–52.0)
Hemoglobin: 13.5 g/dL (ref 13.0–17.0)
Immature Granulocytes: 1 %
Lymphocytes Relative: 16 %
Lymphs Abs: 0.9 10*3/uL (ref 0.7–4.0)
MCH: 28.8 pg (ref 26.0–34.0)
MCHC: 31.2 g/dL (ref 30.0–36.0)
MCV: 92.3 fL (ref 80.0–100.0)
Monocytes Absolute: 1.1 10*3/uL — ABNORMAL HIGH (ref 0.1–1.0)
Monocytes Relative: 18 %
Neutro Abs: 3.7 10*3/uL (ref 1.7–7.7)
Neutrophils Relative %: 63 %
Platelets: 157 10*3/uL (ref 150–400)
RBC: 4.69 MIL/uL (ref 4.22–5.81)
RDW: 13.9 % (ref 11.5–15.5)
WBC: 5.7 10*3/uL (ref 4.0–10.5)
nRBC: 0 % (ref 0.0–0.2)

## 2020-01-09 LAB — GLUCOSE, CAPILLARY
Glucose-Capillary: 110 mg/dL — ABNORMAL HIGH (ref 70–99)
Glucose-Capillary: 120 mg/dL — ABNORMAL HIGH (ref 70–99)
Glucose-Capillary: 123 mg/dL — ABNORMAL HIGH (ref 70–99)
Glucose-Capillary: 128 mg/dL — ABNORMAL HIGH (ref 70–99)
Glucose-Capillary: 174 mg/dL — ABNORMAL HIGH (ref 70–99)
Glucose-Capillary: 94 mg/dL (ref 70–99)
Glucose-Capillary: 98 mg/dL (ref 70–99)

## 2020-01-09 LAB — BASIC METABOLIC PANEL
Anion gap: 10 (ref 5–15)
BUN: 72 mg/dL — ABNORMAL HIGH (ref 6–20)
CO2: 28 mmol/L (ref 22–32)
Calcium: 8.6 mg/dL — ABNORMAL LOW (ref 8.9–10.3)
Chloride: 109 mmol/L (ref 98–111)
Creatinine, Ser: 2.03 mg/dL — ABNORMAL HIGH (ref 0.61–1.24)
GFR, Estimated: 39 mL/min — ABNORMAL LOW (ref >60)
Glucose, Bld: 125 mg/dL — ABNORMAL HIGH (ref 70–99)
Potassium: 4.9 mmol/L (ref 3.5–5.1)
Sodium: 147 mmol/L — ABNORMAL HIGH (ref 135–145)

## 2020-01-09 LAB — MAGNESIUM: Magnesium: 2.5 mg/dL — ABNORMAL HIGH (ref 1.7–2.4)

## 2020-01-09 LAB — PHOSPHORUS: Phosphorus: 4.4 mg/dL (ref 2.5–4.6)

## 2020-01-09 MED ORDER — FREE WATER
200.0000 mL | Status: DC
  Administered 2020-01-09 – 2020-01-25 (×79): 200 mL

## 2020-01-09 MED ORDER — METOCLOPRAMIDE HCL 5 MG/ML IJ SOLN
5.0000 mg | Freq: Three times a day (TID) | INTRAMUSCULAR | Status: DC
  Administered 2020-01-09 – 2020-01-25 (×49): 5 mg via INTRAVENOUS
  Filled 2020-01-09 (×49): qty 2

## 2020-01-09 MED ORDER — DEXMEDETOMIDINE HCL IN NACL 400 MCG/100ML IV SOLN
0.4000 ug/kg/h | INTRAVENOUS | Status: DC
  Administered 2020-01-09: 1.2 ug/kg/h via INTRAVENOUS
  Administered 2020-01-09: 1 ug/kg/h via INTRAVENOUS
  Administered 2020-01-09: 0.8 ug/kg/h via INTRAVENOUS
  Administered 2020-01-09 – 2020-01-10 (×4): 1.2 ug/kg/h via INTRAVENOUS
  Administered 2020-01-10: 1.201 ug/kg/h via INTRAVENOUS
  Administered 2020-01-10 – 2020-01-11 (×5): 1.2 ug/kg/h via INTRAVENOUS
  Administered 2020-01-11: 0.8 ug/kg/h via INTRAVENOUS
  Administered 2020-01-11: 1.2 ug/kg/h via INTRAVENOUS
  Administered 2020-01-11 (×2): 0.8 ug/kg/h via INTRAVENOUS
  Administered 2020-01-11: 1.2 ug/kg/h via INTRAVENOUS
  Administered 2020-01-11: 1 ug/kg/h via INTRAVENOUS
  Administered 2020-01-12 – 2020-01-14 (×14): 0.8 ug/kg/h via INTRAVENOUS
  Administered 2020-01-15: 1.2 ug/kg/h via INTRAVENOUS
  Administered 2020-01-15 (×2): 0.8 ug/kg/h via INTRAVENOUS
  Administered 2020-01-15 (×2): 1.2 ug/kg/h via INTRAVENOUS
  Administered 2020-01-15: 0.8 ug/kg/h via INTRAVENOUS
  Administered 2020-01-16 (×7): 1 ug/kg/h via INTRAVENOUS
  Administered 2020-01-16: 1.2 ug/kg/h via INTRAVENOUS
  Administered 2020-01-17 (×3): 1 ug/kg/h via INTRAVENOUS
  Filled 2020-01-09 (×51): qty 100

## 2020-01-09 NOTE — Progress Notes (Signed)
Pt had two episodes of emesis following coughing fits during SBT.  Was given PRN for nausea, TF held, linens changed.  Attempted to resume TF at 1600, pt had additional episode of emesis.  TF held, MD notified, new orders for nausea added.  Will continue to monitor.

## 2020-01-09 NOTE — Consult Note (Signed)
PHARMACY CONSULT NOTE  Pharmacy Consult for Electrolyte Monitoring and Replacement   Recent Labs: Potassium (mmol/L)  Date Value  01/09/2020 4.9  05/28/2014 3.9   Magnesium (mg/dL)  Date Value  01/09/2020 2.5 (H)  05/14/2013 1.8   Calcium (mg/dL)  Date Value  01/09/2020 8.6 (L)   Calcium, Total (mg/dL)  Date Value  05/28/2014 8.4 (L)   Albumin (g/dL)  Date Value  01/08/2020 2.6 (L)  06/01/2013 3.8   Phosphorus (mg/dL)  Date Value  01/09/2020 4.4  03/09/2013 3.0   Sodium (mmol/L)  Date Value  01/09/2020 147 (H)  05/28/2014 140   Corrected Ca: 8.3 mg/dL  Assessment: 49 year old male with PMHx of HTN, DM, COPD, CAD, hx CVA, sarcoidosis, CHF, afib on Eliquis, CKD 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  12/1: Urine output 2150 mL yesterday 12/2: Urine output 1475 mL yesterday 12/3: Urine output 2100 mL yesterday  Goal of Therapy:  Potassium 4.0 - 5.1 mmol/L Magnesium 2.0 - 2.4 mg/dL Electrolytes WNL  Plan:   No electrolyte replacement indicated today  Reassess tomorrow with AM labs  Tawnya Crook, PharmD 01/09/2020 7:31 AM

## 2020-01-09 NOTE — Progress Notes (Signed)
Spoke with the patient's wife, [name], who was very upset after receiving an update from the patient's care RN [name].  [name] had relayed the patient's progress during the day on 01/08/20 including his WUA & SBT. [name] alerted me that she told multiple team members that she had not wanted a SBT/WUA performed until Sunday and would like to speak with the provider further about her concerns.  We discussed the patient's current status, his plan of care and how the day had gone. [name] was upset because she reported telling the doctor and the patient's nurses since Thursday that she did not want him "weaned on the ventilator or woken up", so that his lungs could rest.  She said that she was told by multiple staff members that this request was documented in the chart and would be passed along.  Therefore when she was told that the patient had participated in a WUA & SBT today she was upset and felt there had been a lapse in communication.  I discussed at length the purpose of the wake up assessment for monitoring neurological status and giving sedation breaks in order to keep patients on the least amount of sedatives tolerated.  We also discussed the purpose of spontaneous breathing trials and how taking small steps to exercise his lungs can be beneficial for the patient in the long run.  In resolution, I informed [name] that I would document her concerns regarding communication and that for now the team would continue to perform WUA & SBT as considered medically necessary.  I also stated that I would call and update her in the morning before leaving after I gave sign out to [name].   She was amenable to these steps and wanted to make clear that on Sunday, December 5th she wants the team to wait on the WUA until his daughter arrives around 10 am.   [name], AGACNP-BC Acute Care Nurse Practitioner [place]   [phone] /  [phone] Please see Amion for pager details.

## 2020-01-09 NOTE — Progress Notes (Signed)
[place], [place] 01/09/20  Subjective:   Hospital day # 7 Patient remains ventilator dependent at this time. Good urine output noted. Creatinine currently 2.03. Sodium slightly high at 147.  Renal: 12/03 0701 - 12/04 0700 In: 1749.6 [I.V.:1059.5; NG/GT:440; IV Piggyback:250] Out: 2565 [Urine:2565] Lab Results  Component Value Date   CREATININE 2.03 (H) 01/09/2020   CREATININE 2.14 (H) 01/08/2020   CREATININE 2.07 (H) 01/08/2020     Objective:  Vital signs in last 24 hours:  Temp:  [97.5 F (36.4 C)-98.8 F (37.1 C)] 97.5 F (36.4 C) (12/04 0800) Pulse Rate:  [63-120] 68 (12/04 0800) Resp:  [14-40] 28 (12/04 0800) BP: (84-166)/(55-98) 96/65 (12/04 0800) SpO2:  [51 %-100 %] 98 % (12/04 0800) FiO2 (%):  [35 %] 35 % (12/04 0143) Weight:  [119.2 kg] 119.2 kg (12/04 0500)  Weight change: -1.5 kg Filed Weights   01/07/20 0500 01/08/20 0358 01/09/20 0500  Weight: 120.5 kg 120.7 kg 119.2 kg    Intake/Output:    Intake/Output Summary (Last 24 hours) at 01/09/2020 0853 Last data filed at 01/09/2020 0815 Gross per 24 hour  Intake 1793.55 ml  Output 2665 ml  Net -871.45 ml     Physical Exam: General: Critically ill appearing  HEENT ETT in place  Pulm/lungs Vent assisted, scattered rhonchi  CVS/Heart S1S2 no rubs  Abdomen:  Soft nontender, nondistended  Extremities: 1+ lower extremity edema  Neurologic: Sedated but able to follow simple commands  Skin: warm  GU: Foley in place with clear yellow urine       Basic Metabolic Panel:  Recent Labs  Lab 01/05/20 0459 01/05/20 0459 01/06/20 0544 01/06/20 0544 01/07/20 0621 01/08/20 0451 01/09/20 0433  NA 142  --  143  --  143 143 147*  K 4.2  --  4.0  --  3.8 4.0 4.9  CL 100  --  101  --  103 106 109  CO2 33*  --  32  --  _0 GLUCOSE 91  --  112*  --  127* 108* 125*  BUN 57*  --  67*  --  74* 74* 72*  CREATININE 2.25*  --  2.38*  --  2.34* 2.07*  2.14* 2.03*  CALCIUM  8.3*   < > 8.2*   < > 8.1* 8.1* 8.6*  MG 2.6*  --  2.7*  --  2.9* 2.7* 2.5*  PHOS 2.2*  --  4.1  --  5.0* 4.8* 4.4   < > = values in this interval not displayed.     CBC: Recent Labs  Lab 01/05/20 0459 01/06/20 0544 01/07/20 0621 01/08/20 0451 01/09/20 0433  WBC 7.6 6.2 5.2 4.3 5.7  NEUTROABS  --  3.8 2.8 2.1 3.7  HGB 13.1 13.1 13.4 12.5* 13.5  HCT 41.3 41.6 43.4 40.7 43.3  MCV 90.0 90.8 92.1 93.6 92.3  PLT 199 173 153 150 157      Lab Results  Component Value Date   HEPBSAG NON REACTIVE 01/03/2020   HEPBSAB NON REACTIVE 01/03/2020   HEPBIGM NON REACTIVE 01/03/2020      Microbiology:  Recent Results (from the past 240 hour(s))  Resp Panel by RT-PCR (Flu A&B, Covid) Nasopharyngeal Swab     Status: None   Collection Time: 01/01/20  3:13 PM   Specimen: Nasopharyngeal Swab; Nasopharyngeal(NP) swabs in vial transport medium  Result Value Ref Range Status   SARS Coronavirus 2 by RT PCR NEGATIVE NEGATIVE Final  Comment: (NOTE) SARS-CoV-2 target nucleic acids are NOT DETECTED.  The SARS-CoV-2 RNA is generally detectable in upper respiratory specimens during the acute phase of infection. The lowest concentration of SARS-CoV-2 viral copies this assay can detect is 138 copies/mL. A negative result does not preclude SARS-Cov-2 infection and should not be used as the sole basis for treatment or other patient management decisions. A negative result may occur with  improper specimen collection/handling, submission of specimen other than nasopharyngeal swab, presence of viral mutation(s) within the areas targeted by this assay, and inadequate number of viral copies(<138 copies/mL). A negative result must be combined with clinical observations, patient history, and epidemiological information. The expected result is Negative.  Fact Sheet for Patients:  EntrepreneurPulse.com.au  Fact Sheet for Healthcare Providers:   IncredibleEmployment.be  This test is no t yet approved or cleared by the [place] FDA and  has been authorized for detection and/or diagnosis of SARS-CoV-2 by FDA under an Emergency Use Authorization (EUA). This EUA will remain  in effect (meaning this test can be used) for the duration of the COVID-19 declaration under Section 564(b)(1) of the Act, 21 U.S.C.section 360bbb-3(b)(1), unless the authorization is terminated  or revoked sooner.       Influenza A by PCR NEGATIVE NEGATIVE Final   Influenza B by PCR NEGATIVE NEGATIVE Final    Comment: (NOTE) The Xpert Xpress SARS-CoV-2/FLU/RSV plus assay is intended as an aid in the diagnosis of influenza from Nasopharyngeal swab specimens and should not be used as a sole basis for treatment. Nasal washings and aspirates are unacceptable for Xpert Xpress SARS-CoV-2/FLU/RSV testing.  Fact Sheet for Patients: EntrepreneurPulse.com.au  Fact Sheet for Healthcare Providers: IncredibleEmployment.be  This test is not yet approved or cleared by the [place] FDA and has been authorized for detection and/or diagnosis of SARS-CoV-2 by FDA under an Emergency Use Authorization (EUA). This EUA will remain in effect (meaning this test can be used) for the duration of the COVID-19 declaration under Section 564(b)(1) of the Act, 21 U.S.C. section 360bbb-3(b)(1), unless the authorization is terminated or revoked.  Performed at [facility], [address] [place], [place] [address]   MRSA PCR Screening     Status: None   Collection Time: 01/02/20  2:45 PM   Specimen: Nasopharyngeal  Result Value Ref Range Status   MRSA by PCR NEGATIVE NEGATIVE Final    Comment:        The GeneXpert MRSA Assay (FDA approved for NASAL specimens only), is one component of a comprehensive MRSA colonization surveillance program. It is not intended to diagnose MRSA infection nor to guide  or monitor treatment for MRSA infections. Performed at [facility], [address] [place], [place] [address]   Culture, respiratory     Status: None   Collection Time: 01/02/20  5:00 PM   Specimen: INDUCED SPUTUM  Result Value Ref Range Status   Specimen Description   Final    INDUCED SPUTUM Performed at [facility], [address], [place], [place] [address]    Special Requests   Final    NONE Performed at [facility], [address] [place], [place] [address]    Gram Stain   Final    NO WBC SEEN RARE GRAM POSITIVE COCCI IN PAIRS IN CHAINS    Culture   Final    FEW Normal respiratory flora-no Staph aureus or Pseudomonas seen Performed at [facility], [address], [place], [place] [address]  Report Status 01/05/2020 FINAL  Final  Culture, respiratory     Status: None   Collection Time: 01/04/20 11:14 AM   Specimen: Tracheal Aspirate; Respiratory  Result Value Ref Range Status   Specimen Description   Final    TRACHEAL ASPIRATE Performed at [facility], [address] [place], [place] [address]    Special Requests   Final    NONE Performed at [facility], [address] [place], [place] [address]    Gram Stain   Final    FEW WBC PRESENT, PREDOMINANTLY PMN NO ORGANISMS SEEN    Culture   Final    NO GROWTH 2 DAYS Performed at [facility] [address] [place], [place] [address]    Report Status 01/07/2020 FINAL  Final    Coagulation Studies: No results for input(s): LABPROT, INR in the last 72 hours.  Urinalysis: No results for input(s): COLORURINE, LABSPEC, PHURINE, GLUCOSEU, HGBUR, BILIRUBINUR, KETONESUR, PROTEINUR, UROBILINOGEN, NITRITE, LEUKOCYTESUR in the last 72 hours.  Invalid input(s): APPERANCEUR    Imaging: DG Chest Port 1 View  Result Date: 01/09/2020 CLINICAL DATA:  Respiratory failure EXAM: PORTABLE CHEST 1 VIEW COMPARISON:  Chest x-rays dated 01/07/2020 and  01/04/2020 FINDINGS: Endotracheal tube appears adequately positioned with tip approximately 5 cm above the carina. Enteric tube passes below the diaphragm. Increased airspace opacity at the RIGHT lung base, pneumonia versus atelectasis, superimposed on chronic scarring/fibrosis. Additional patchy scarring/fibrosis within each lung, stable. No pleural effusion or pneumothorax is seen. Heart size and mediastinal contours are stable. Visualized osseous structures about the chest are unremarkable. IMPRESSION: 1. Increased airspace opacity at the RIGHT lung base, pneumonia versus atelectasis, superimposed on chronic scarring/fibrosis. 2. Endotracheal tube appears adequately positioned with tip approximately 5 cm above the carina. Electronically Signed   By: Franki Cabot M.D.   On: 01/09/2020 05:17   DG Chest Port 1 View  Result Date: 01/07/2020 CLINICAL DATA:  Hypoxia EXAM: PORTABLE CHEST 1 VIEW COMPARISON:  Radiograph 01/04/2020 FINDINGS: Endotracheal tube terminates in the mid trachea, 5 cm from the carina. Transesophageal tube tip and side port terminate below level of imaging, beyond the GE junction. Telemetry leads overlie the chest. There redemonstrated areas of bandlike opacity throughout both lungs with associated bronchiectatic features and architectural distortion most compatible with regions of scarring seen on comparison CT. Low volumes and atelectatic changes are slightly more pronounced than on comparison as well with some developing hazy opacity in the left mid to lower lung and right lung base. Early edema not excluded with central vascular congestion. Cardiomediastinal contours are stable from prior. No acute osseous or soft tissue abnormality. IMPRESSION: 1. Diminished volumes with developing hazy opacity in the left mid to lower lung and right lung base, could atelectatic change versus edema or early infection. Correlate with clinical features. 2. Chronic bilateral scarring and bronchiectatic  features. 3. Satisfactory positioning of lines and tubes. Electronically Signed   By: Lovena Le M.D.   On: 01/07/2020 21:11     Medications:   . sodium chloride    . sodium chloride    . famotidine (PEPCID) IV 20 mg (01/09/20 0815)  . fentaNYL infusion INTRAVENOUS 150 mcg/hr (01/09/20 0800)  . propofol (DIPRIVAN) infusion 40 mcg/kg/min (01/09/20 0800)   . acetylcysteine  4 mL Nebulization BID  . apixaban  5 mg Per Tube BID  . budesonide (PULMICORT) nebulizer solution  0.5 mg Nebulization BID  . chlorhexidine gluconate (MEDLINE KIT)  15 mL Mouth Rinse BID  .  Chlorhexidine Gluconate Cloth  6 each Topical Q0600  . docusate  100 mg Per Tube BID  . feeding supplement (PROSource TF)  90 mL Per Tube QID  . feeding supplement (VITAL HIGH PROTEIN)  1,000 mL Per Tube Q24H  . insulin aspart  0-20 Units Subcutaneous TID WC  . ipratropium-albuterol  3 mL Nebulization Q6H  . mouth rinse  15 mL Mouth Rinse 10 times per day  . multivitamin with minerals  1 tablet Per Tube Daily  . polyethylene glycol  17 g Per Tube Daily   acetaminophen **OR** acetaminophen, albuterol, fentaNYL, metoprolol tartrate, midazolam, midazolam, ondansetron **OR** ondansetron (ZOFRAN) IV, vecuronium  Assessment/ Plan:  49 y.o. male with     admitted on 01/01/2020 for Shortness of breath [R06.02] Hypercapnia [R06.89] COPD exacerbation (HCC) [J44.1] Acute respiratory failure with hypoxia and hypercapnia (HCC) [J96.01, J96.02] Acute and chr resp failure, unsp w hypoxia or hypercapnia ([place]) [J96.20]   #. Acute kidney injury on chronic kidney disease stage IIIA with proteinuria: baseline creatinine of 1.41, GFR of 58 on 08/01/19.  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  Acute kidney injury secondary to ATN from hypotension and hypoxia Nonoliguric urine output. No IV contrast exposure.  Lab Results  Component Value Date   CREATININE 2.03 (H) 01/09/2020   CREATININE 2.14  (H) 01/08/2020   CREATININE 2.07 (H) 01/08/2020   - Renal ultrasound- unremarkable , no obstruction  12/03 0701 - 12/04 0700 In: 1749.6 [I.V.:1059.5; NG/GT:440; IV Piggyback:250] Out: 2565 [Urine:2565]   -Renal function about the same today.  Creatinine 2.03 with a BUN of 72.  Good urine output noted.  No acute indication for dialysis.  Continue to monitor renal parameters closely.  #.   Acute respiratory failure: requiring intubation and mechanical ventilation.  Acute exacerbation of COPD and sarcoidosis. 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  -Remains ventilator dependent at the moment.  Pulmonary/critical care following.  #. Diabetes mellitus type II with chronic kidney disease: insulin dependent.  Lab Results  Component Value Date   HGBA1C 6.5 (H) 01/02/2020  UPC 0.47    LOS: 7 Nahsir Venezia 12/4/20218:53 AM  [place], [place]  Note: This note was prepared with Dragon dictation. Any transcription errors are unintentional

## 2020-01-09 NOTE — Progress Notes (Signed)
Follow up - Critical Care Medicine Note  Patient Details:    [name] is an 49 y.o. 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 Previous h/o [facility] AND LTACH, admitted with acute on chronic mixed respiratory failure requiring intubation PCCM consulted.  Lines, Airways, Drains: Airway 8 mm (Active)  Secured at (cm) 26 cm 01/08/20 1600  Measured From Lips 01/08/20 1600  Secured Location Left 01/08/20 1504  Secured By [employer] 01/08/20 1600  Tube Holder Repositioned Yes 01/08/20 1504  Prone position No 01/08/20 1504  Head position Right 01/05/20 0440  Cuff Pressure (cm H2O) 32 cm H2O 01/08/20 1504  Site Condition Cool;Dry 01/08/20 1600     NG/OG Tube Orogastric Left mouth Xray Documented cm marking at nare/ corner of mouth 65 cm (Active)  Cm Marking at Nare/Corner of Mouth (if applicable) 65 cm 03/47/42 0800  Site Assessment Clean;Dry;Intact 01/08/20 0800  Ongoing Placement Verification No change in cm markings or external length of tube from initial placement 01/08/20 0800  Status Infusing tube feed 01/08/20 0800  Output (mL) 60 mL 01/06/20 0957     Urethral Catheter Reina RN Temperature probe 16 Fr. (Active)  Indication for Insertion or Continuance of Catheter Therapy based on hourly urine output monitoring and documentation for critical condition (NOT STRICT I&O) 01/08/20 0800  Site Assessment Clean;Intact 01/08/20 0800  Catheter Maintenance Bag below level of bladder;Catheter secured;Drainage bag/tubing not touching floor;Insertion date on drainage bag;No dependent loops;Seal intact 01/08/20 0800  Collection Container Standard drainage bag 01/08/20 0800  Securement Method Securing device (Describe) 01/08/20 0800   Urinary Catheter Interventions (if applicable) Unclamped 59/56/38 0800  Output (mL) 175 mL 01/08/20 1750    Anti-infectives:  Anti-infectives (From admission, onward)   Start     Dose/Rate Route Frequency Ordered Stop   01/05/20 1800  levofloxacin (LEVAQUIN) IVPB 750 mg        750 mg 100 mL/hr over 90 Minutes Intravenous Every 24 hours 01/05/20 1228 01/08/20 1912   01/04/20 1800  levofloxacin (LEVAQUIN) IVPB 750 mg  Status:  Discontinued        750 mg 100 mL/hr over 90 Minutes Intravenous Every 48 hours 01/03/20 0832 01/05/20 1228   01/02/20 1800  levofloxacin (LEVAQUIN) IVPB 500 mg  Status:  Discontinued        500 mg 100 mL/hr over 60 Minutes Intravenous Every 24 hours 01/02/20 1613 01/03/20 0832     Scheduled Meds: . acetylcysteine  4 mL Nebulization BID  . apixaban  5 mg Per Tube BID  . budesonide (PULMICORT) nebulizer solution  0.5 mg Nebulization BID  . chlorhexidine gluconate (MEDLINE KIT)  15 mL Mouth Rinse BID  . Chlorhexidine Gluconate Cloth  6 each Topical Q0600  . docusate  100 mg Per Tube BID  . feeding supplement (PROSource TF)  90 mL Per Tube QID  . feeding supplement (VITAL HIGH PROTEIN)  1,000 mL Per Tube Q24H  . insulin aspart  0-20 Units Subcutaneous TID WC  . ipratropium-albuterol  3 mL Nebulization Q6H  . mouth rinse  15 mL Mouth Rinse 10 times per day  . multivitamin with minerals  1 tablet Per Tube Daily  . polyethylene glycol  17 g Per Tube Daily   Continuous Infusions: . sodium chloride    . sodium chloride    .  famotidine (PEPCID) IV 20 mg (01/09/20 0815)  . fentaNYL infusion INTRAVENOUS 100 mcg/hr (01/09/20 0900)  . propofol (DIPRIVAN) infusion 40 mcg/kg/min (01/09/20 0800)   PRN Meds:.acetaminophen **OR** acetaminophen, albuterol, fentaNYL, metoprolol tartrate, midazolam, midazolam, ondansetron **OR** ondansetron (ZOFRAN) IV, vecuronium  Microbiology: Results for orders placed or performed during the hospital encounter of 01/01/20  Resp Panel  by RT-PCR (Flu A&B, Covid) Nasopharyngeal Swab     Status: None   Collection Time: 01/01/20  3:13 PM   Specimen: Nasopharyngeal Swab; Nasopharyngeal(NP) swabs in vial transport medium  Result Value Ref Range Status   SARS Coronavirus 2 by RT PCR NEGATIVE NEGATIVE Final    Comment: (NOTE) SARS-CoV-2 target nucleic acids are NOT DETECTED.  The SARS-CoV-2 RNA is generally detectable in upper respiratory specimens during the acute phase of infection. The lowest concentration of SARS-CoV-2 viral copies this assay can detect is 138 copies/mL. A negative result does not preclude SARS-Cov-2 infection and should not be used as the sole basis for treatment or other patient management decisions. A negative result may occur with  improper specimen collection/handling, submission of specimen other than nasopharyngeal swab, presence of viral mutation(s) within the areas targeted by this assay, and inadequate number of viral copies(<138 copies/mL). A negative result must be combined with clinical observations, patient history, and epidemiological information. The expected result is Negative.  Fact Sheet for Patients:  EntrepreneurPulse.com.au  Fact Sheet for Healthcare Providers:  IncredibleEmployment.be  This test is no t yet approved or cleared by the [place] FDA and  has been authorized for detection and/or diagnosis of SARS-CoV-2 by FDA under an Emergency Use Authorization (EUA). This EUA will remain  in effect (meaning this test can be used) for the duration of the COVID-19 declaration under Section 564(b)(1) of the Act, 21 U.S.C.section 360bbb-3(b)(1), unless the authorization is terminated  or revoked sooner.       Influenza A by PCR NEGATIVE NEGATIVE Final   Influenza B by PCR NEGATIVE NEGATIVE Final    Comment: (NOTE) The Xpert Xpress SARS-CoV-2/FLU/RSV plus assay is intended as an aid in the diagnosis of influenza from Nasopharyngeal swab  specimens and should not be used as a sole basis for treatment. Nasal washings and aspirates are unacceptable for Xpert Xpress SARS-CoV-2/FLU/RSV testing.  Fact Sheet for Patients: EntrepreneurPulse.com.au  Fact Sheet for Healthcare Providers: IncredibleEmployment.be  This test is not yet approved or cleared by the [place] FDA and has been authorized for detection and/or diagnosis of SARS-CoV-2 by FDA under an Emergency Use Authorization (EUA). This EUA will remain in effect (meaning this test can be used) for the duration of the COVID-19 declaration under Section 564(b)(1) of the Act, 21 U.S.C. section 360bbb-3(b)(1), unless the authorization is terminated or revoked.  Performed at [facility], [address] [place], [place] [address]   MRSA PCR Screening     Status: None   Collection Time: 01/02/20  2:45 PM   Specimen: Nasopharyngeal  Result Value Ref Range Status   MRSA by PCR NEGATIVE NEGATIVE Final    Comment:        The GeneXpert MRSA Assay (FDA approved for NASAL specimens only), is one component of a comprehensive MRSA colonization surveillance program. It is not intended to diagnose MRSA infection nor to guide or monitor treatment for MRSA infections. Performed at [facility], [address] [place], [place] [address]   Culture, respiratory     Status: None   Collection Time: 01/02/20  5:00 PM   Specimen:  INDUCED SPUTUM  Result Value Ref Range Status   Specimen Description   Final    INDUCED SPUTUM Performed at [facility], [address], [place], [place] [address]    Special Requests   Final    NONE Performed at [facility], [address] [place], [place] [address]    Gram Stain   Final    NO WBC SEEN RARE GRAM POSITIVE COCCI IN PAIRS IN CHAINS    Culture   Final    FEW Normal respiratory flora-no Staph aureus or Pseudomonas seen Performed at [facility], [address], [place], [place] [address]    Report Status 01/05/2020 FINAL  Final  Culture, respiratory     Status: None   Collection Time: 01/04/20 11:14 AM   Specimen: Tracheal Aspirate; Respiratory  Result Value Ref Range Status   Specimen Description   Final    TRACHEAL ASPIRATE Performed at [facility], [address] [place], [place] [address]    Special Requests   Final    NONE Performed at [facility] [place], [address] [place], [place] [address]    Gram Stain   Final    FEW WBC PRESENT, PREDOMINANTLY PMN NO ORGANISMS SEEN    Culture   Final    NO GROWTH 2 DAYS Performed at [place] [address] [place],  [address]    Report Status 01/07/2020 FINAL  Final     Results for orders placed or performed during the hospital encounter of 01/01/20 (from the past 24 hour(s))  Glucose, capillary     Status: Abnormal   Collection Time: 01/08/20 11:26 AM  Result Value Ref Range   Glucose-Capillary 102 (H) 70 - 99 mg/dL  Glucose, capillary     Status: Abnormal   Collection Time: 01/08/20  3:29 PM  Result Value Ref Range   Glucose-Capillary 144 (H) 70 - 99 mg/dL  Glucose, capillary     Status: None   Collection Time: 01/08/20  8:05 PM  Result Value Ref Range   Glucose-Capillary 94 70 - 99 mg/dL  Glucose, capillary     Status: None   Collection Time: 01/08/20 11:26 PM  Result Value Ref Range   Glucose-Capillary 98 70 - 99 mg/dL  Glucose, capillary     Status: Abnormal   Collection Time: 01/09/20  4:16 AM  Result Value Ref Range   Glucose-Capillary 123 (H) 70 - 99 mg/dL   Comment 1 Notify RN   CBC with Differential/Platelet     Status: Abnormal   Collection Time: 01/09/20  4:33 AM  Result Value Ref Range   WBC 5.7 4.0 - 10.5 K/uL   RBC 4.69 4.22 - 5.81 MIL/uL   Hemoglobin 13.5 13.0 - 17.0 g/dL   HCT 43.3 39 - 52 %   MCV 92.3 80.0 - 100.0 fL   MCH 28.8 26.0 - 34.0 pg   MCHC 31.2 30.0 - 36.0 g/dL   RDW 13.9 11.5 -  15.5 %   Platelets 157 150 - 400 K/uL   nRBC 0.0 0.0 - 0.2 %   Neutrophils Relative % 63 %   Neutro Abs 3.7 1.7 - 7.7 K/uL   Lymphocytes Relative 16 %   Lymphs Abs 0.9 0.7 - 4.0 K/uL   Monocytes Relative 18 %   Monocytes Absolute 1.1 (H) 0.1 - 1.0 K/uL   Eosinophils Relative 2 %   Eosinophils Absolute 0.1 0.0 - 0.5 K/uL   Basophils Relative  0 %   Basophils Absolute 0.0 0.0 - 0.1 K/uL   Immature Granulocytes 1 %   Abs Immature Granulocytes 0.03 0.00 - 0.07 K/uL  Basic metabolic panel     Status: Abnormal   Collection Time: 01/09/20  4:33 AM  Result Value Ref Range   Sodium 147 (H) 135 - 145 mmol/L   Potassium 4.9 3.5 - 5.1 mmol/L   Chloride 109 98 - 111 mmol/L   CO2 28 22 - 32 mmol/L   Glucose, Bld 125 (H) 70 - 99 mg/dL   BUN 72 (H) 6 - 20 mg/dL   Creatinine, Ser 2.03 (H) 0.61 - 1.24 mg/dL   Calcium 8.6 (L) 8.9 - 10.3 mg/dL   GFR, Estimated 39 (L) >60 mL/min   Anion gap 10 5 - 15  Magnesium     Status: Abnormal   Collection Time: 01/09/20  4:33 AM  Result Value Ref Range   Magnesium 2.5 (H) 1.7 - 2.4 mg/dL  Phosphorus     Status: None   Collection Time: 01/09/20  4:33 AM  Result Value Ref Range   Phosphorus 4.4 2.5 - 4.6 mg/dL  Glucose, capillary     Status: None   Collection Time: 01/09/20  7:17 AM  Result Value Ref Range   Glucose-Capillary 94 70 - 99 mg/dL      Best Practice/Protocols:  VTE Prophylaxis: Direct Thrombin Inhibitor GI Prophylaxis: Antihistamine Intermittent Sedation Weaning  Events: 11/27 ICU admission, INTUBATED #8ETT 11/28 severe resp failure, progressive Renal failure, Nephrology consulted 11/29 - patient passed SBT today, able to communicate/follow directions while intubated status post extubation to BIPAP - after 1 hour on BIPAP patient became unresponsive and required re-intubation. I have met with mother in law at bedside and reviewed careplan.  11/30- patient remains on ventilator.  He has thickened mucopurulent tracheal secretions, he can  follow verbal instructions but failed SBT today. GPC+ tracheal aspirate culture. 01/06/20- SBT for extubation, failed with heavy secretions.  TOC consult today.  01/07/20- patient failed SBT after 10 minutes with acute hypoxemia.  PALS consult for VDRF.  Spoke with Wife Sharl Ma about trache she wants to wait for several days 01/08/20: Palliative care discussing with family, likelihood patient will remain ventilator dependent, continues to fail SBT's has had prior episode of trach and LTACH stay.  Studies: DG Chest 2 View  Result Date: 01/01/2020 CLINICAL DATA:  Chest pain EXAM: CHEST - 2 VIEW COMPARISON:  07/31/2019 FINDINGS: Linear areas of scarring within the lungs bilaterally. Heart is normal size. No acute confluent opacities or effusions. No acute bony abnormality. IMPRESSION: Areas of scarring bilaterally, stable.  No active disease. Electronically Signed   By: Rolm Baptise M.D.   On: 01/01/2020 11:42   DG Abd 1 View  Result Date: 01/04/2020 CLINICAL DATA:  Status post orogastric tube placement. EXAM: ABDOMEN - 1 VIEW COMPARISON:  February 11, 2015. FINDINGS: The bowel gas pattern is normal. Distal tip of enteric tube is seen in proximal stomach. No radio-opaque calculi or other significant radiographic abnormality are seen. IMPRESSION: Distal tip of enteric tube seen in proximal stomach. Electronically Signed   By: Marijo Conception M.D.   On: 01/04/2020 11:39   US RENAL  Result Date: 01/03/2020 CLINICAL DATA:  Acute kidney failure EXAM: RENAL ULTRASOUND COMPARISON:  None. FINDINGS: Right Kidney: Renal measurements: 9.7 x 7.0 x 5.4 cm = volume: 193 mL. Echogenicity and renal cortical thickness are within normal limits. No mass, perinephric fluid, or hydronephrosis visualized. No sonographically demonstrable  calculus or ureterectasis. Left Kidney: Renal measurements: 9.9 x 4.9 x 6.1 cm = volume: 158 mL. Echogenicity and renal cortical thickness are within normal limits. No mass, perinephric fluid,  or hydronephrosis visualized. No sonographically demonstrable calculus or ureterectasis. Bladder: Foley catheter in place. Urinary bladder decompressed and cannot be assessed. Other: None. IMPRESSION: Kidneys appear unremarkable bilaterally. Urinary bladder decompressed with Foley catheter and cannot be assessed at this time. Electronically Signed   By: Lowella Grip III M.D.   On: 01/03/2020 11:11   DG Chest Port 1 View  Result Date: 01/09/2020 CLINICAL DATA:  Respiratory failure EXAM: PORTABLE CHEST 1 VIEW COMPARISON:  Chest x-rays dated 01/07/2020 and 01/04/2020 FINDINGS: Endotracheal tube appears adequately positioned with tip approximately 5 cm above the carina. Enteric tube passes below the diaphragm. Increased airspace opacity at the RIGHT lung base, pneumonia versus atelectasis, superimposed on chronic scarring/fibrosis. Additional patchy scarring/fibrosis within each lung, stable. No pleural effusion or pneumothorax is seen. Heart size and mediastinal contours are stable. Visualized osseous structures about the chest are unremarkable. IMPRESSION: 1. Increased airspace opacity at the RIGHT lung base, pneumonia versus atelectasis, superimposed on chronic scarring/fibrosis. 2. Endotracheal tube appears adequately positioned with tip approximately 5 cm above the carina. Electronically Signed   By: Franki Cabot M.D.   On: 01/09/2020 05:17   DG Chest Port 1 View  Result Date: 01/07/2020 CLINICAL DATA:  Hypoxia EXAM: PORTABLE CHEST 1 VIEW COMPARISON:  Radiograph 01/04/2020 FINDINGS: Endotracheal tube terminates in the mid trachea, 5 cm from the carina. Transesophageal tube tip and side port terminate below level of imaging, beyond the GE junction. Telemetry leads overlie the chest. There redemonstrated areas of bandlike opacity throughout both lungs with associated bronchiectatic features and architectural distortion most compatible with regions of scarring seen on comparison CT. Low volumes and  atelectatic changes are slightly more pronounced than on comparison as well with some developing hazy opacity in the left mid to lower lung and right lung base. Early edema not excluded with central vascular congestion. Cardiomediastinal contours are stable from prior. No acute osseous or soft tissue abnormality. IMPRESSION: 1. Diminished volumes with developing hazy opacity in the left mid to lower lung and right lung base, could atelectatic change versus edema or early infection. Correlate with clinical features. 2. Chronic bilateral scarring and bronchiectatic features. 3. Satisfactory positioning of lines and tubes. Electronically Signed   By: Lovena Le M.D.   On: 01/07/2020 21:11   DG Chest Port 1 View  Result Date: 01/04/2020 CLINICAL DATA:  Status post intubation. EXAM: PORTABLE CHEST 1 VIEW COMPARISON:  Same day. FINDINGS: The heart size and mediastinal contours are within normal limits. Endotracheal and nasogastric tubes are unchanged in position. No pneumothorax is noted. Stable bilateral lung opacities are noted most consistent with scarring, with probable small left pleural effusion. The visualized skeletal structures are unremarkable. IMPRESSION: Stable support apparatus. Stable bilateral lung opacities are noted most consistent with scarring, with probable small left pleural effusion. Electronically Signed   By: Marijo Conception M.D.   On: 01/04/2020 11:38   DG Chest Port 1 View  Result Date: 01/04/2020 CLINICAL DATA:  Respiratory failure EXAM: PORTABLE CHEST 1 VIEW COMPARISON:  01/03/2020 FINDINGS: Endotracheal tube is seen 5.1 cm above the carina. Nasogastric tube extends into the upper abdomen beyond the margin of the examination. Linear scarring within the right mid lung zone and left lung base are again identified. Mild asymmetric left-sided volume loss is unchanged. Tiny left pleural effusion or pleural  scarring is seen at the left costophrenic angle. No superimposed confluent  pulmonary infiltrate. No pneumothorax. Cardiac size within normal limits. Pulmonary vascularity is normal. IMPRESSION: Stable support tubes. Unchanged examination with multifocal regions of parenchymal scarring and tiny left pleural effusion or pleural scarring. Electronically Signed   By: Fidela Salisbury MD   On: 01/04/2020 04:55   DG Chest Port 1 View  Result Date: 01/03/2020 CLINICAL DATA:  Intubation EXAM: PORTABLE CHEST 1 VIEW COMPARISON:  01/02/2020 FINDINGS: Endotracheal and enteric tubes are unchanged in position. Shallow inspiration with atelectasis in the lung bases. Blunting of the costophrenic angles suggests small effusions. Linear scarring in the right lung. No significant change. IMPRESSION: Stable appearance of the chest. Electronically Signed   By: Lucienne Capers M.D.   On: 01/03/2020 05:46   DG Chest Portable 1 View  Result Date: 01/02/2020 CLINICAL DATA:  OG placement. EXAM: PORTABLE CHEST 1 VIEW COMPARISON:  January 01, 2020 FINDINGS: Endotracheal tube in satisfactory position. Enteric catheter with side hole at the expected location of the GE junction. Cardiomediastinal silhouette is normal. Mediastinal contours appear intact. Chronic abnormal appearance of the lungs with bilateral areas of peribronchial scarring/atelectasis. Osseous structures are without acute abnormality. Soft tissues are grossly normal. IMPRESSION: 1. Endotracheal tube in satisfactory position. 2. Enteric catheter with side hole at the expected location of the GE junction. Advancement with may be considered. 3. Stable abnormal appearance of the lungs with peribronchial areas of scarring/atelectasis and chronic elevation of the left hemidiaphragm. Electronically Signed   By: Fidela Salisbury M.D.   On: 01/02/2020 13:57    Consults:    Subjective:    Overnight Issues: Overnight no issues.  Remains ventilator dependent.  Objective:  Vital signs for last 24 hours: Temp:  [97.5 F (36.4 C)-98.8 F  (37.1 C)] 97.5 F (36.4 C) (12/04 0800) Pulse Rate:  [63-120] 68 (12/04 0800) Resp:  [14-40] 28 (12/04 0800) BP: (88-166)/(55-98) 96/65 (12/04 0800) SpO2:  [51 %-100 %] 98 % (12/04 0800) FiO2 (%):  [35 %] 35 % (12/04 0858) Weight:  [119.2 kg] 119.2 kg (12/04 0500)  Hemodynamic parameters for last 24 hours:    Intake/Output from previous day: 12/03 0701 - 12/04 0700 In: 1749.6 [I.V.:1059.5; NG/GT:440; IV Piggyback:250] Out: 2565 [Urine:2565]  Intake/Output this shift: Total I/O In: 44 [I.V.:44] Out: 325 [Urine:325]  Vent settings for last 24 hours: Vent Mode: PRVC FiO2 (%):  [35 %] 35 % Set Rate:  [8 bmp-28 bmp] 28 bmp Vt Set:  [500 mL] 500 mL PEEP:  [5 cmH20] 5 cmH20 Pressure Support:  [10 cmH20] 10 cmH20 Plateau Pressure:  [20 cmH20-22 cmH20] 20 cmH20  Physical Exam:  GENERAL: Obese male, intubated, calm, RASS 0, mechanically ventilated.  Does interact. HEAD: Normocephalic, atraumatic.  EYES: Pupils equal, round, reactive to light.  No scleral icterus.  MOUTH: Orotracheally intubated, OG in place. NECK: Supple, trachea midline. PULMONARY: Coarse breath sounds, no wheezes. CARDIOVASCULAR: S1 and S2. Regular rate and rhythm. No murmurs, rubs, or gallops.  GASTROINTESTINAL: Soft, non-distended.  Positive bowel sounds.   MUSCULOSKELETAL: +1 anasarca, no clubbing. NEUROLOGIC: Follows commands, calm, no overt focal deficit. SKIN:intact,warm,dry.  No skin issues noted.  Nutrition Status: Nutrition Problem: Inadequate oral intake Etiology: inability to eat Signs/Symptoms: NPO status Interventions: Tube feeding, Prostat, MVI  Chest x-ray today:   Possible new patchy infiltrate right lower lobe.    Assessment/Plan:  Synopsis: 49 yo AAM with severe morbid obesity with biventricular heart failure with acute on chronic resp failue with  hypoxia and hypercapnia due to acute COPD exacerbation with acute CAP with sepsis, present on admission, with severe metabolic  encephalopathy from hypoxia and hypercapnia with progressive renal failure.  Required intubation.  Acute on chronic respiratory failure (hypoxia/hypercarbia) Underlying severe COPD and sarcoidosis Atelectasis Continue ventilator support Prior issues with respiratory failure prior trach and LTACH End-of-life discussions with family per Palliative Care Family still desires full code Tolerating SBT better today continue daily trials as tolerated Reassess early in week to see if may need repeat tracheostomy Initiated recruitment maneuvers for atelectasis Continue bronchodilators New infiltrate versus atelectasis right lower lobe Collect sputum for C&S Ventilator sedation: Fentanyl/Precedex  Severe obstructive sleep apnea Chronic hypercapnic respiratory failure Noncompliant with noninvasive ventilator (Trilogy) at home This issue adds complexity to his management  Acute kidney injury on chronic kidney disease stage IIIa ATN/hypotension/hypoxia culprits Nephrology following appreciate input Avoid nephrotoxins when able Has nonoliguric urine output No indication for dialysis at present Continue to monitor Continue to hold ACE I and furosemide Foley catheter indicated currently, assess need on daily basis Developing hypernatremia, increase free water  Diabetes mellitus type 2 with chronic kidney disease Insulin requiring ICU hyperglycemia protocol    LOS: 7 days   Additional comments: Discussed during multidisciplinary rounds with ICU team  Critical Care Total Time*: 40 Minutes  C. Derrill Kay, MD [place] P[facility]M 01/09/2020  *Care during the described time interval was provided by me and/or other providers on the critical care team.  I have reviewed this patient's available data, including medical history, events of note, physical examination and test results as part of my evaluation.  **This note was dictated using voice recognition software/Dragon.  Despite best efforts to  proofread, errors can occur which can change the meaning.  Any change was purely unintentional.

## 2020-01-10 DIAGNOSIS — J9602 Acute respiratory failure with hypercapnia: Secondary | ICD-10-CM | POA: Diagnosis not present

## 2020-01-10 DIAGNOSIS — J9601 Acute respiratory failure with hypoxia: Secondary | ICD-10-CM | POA: Diagnosis not present

## 2020-01-10 LAB — CBC WITH DIFFERENTIAL/PLATELET
Abs Immature Granulocytes: 0.03 10*3/uL (ref 0.00–0.07)
Basophils Absolute: 0 10*3/uL (ref 0.0–0.1)
Basophils Relative: 0 %
Eosinophils Absolute: 0 10*3/uL (ref 0.0–0.5)
Eosinophils Relative: 0 %
HCT: 44.9 % (ref 39.0–52.0)
Hemoglobin: 13.9 g/dL (ref 13.0–17.0)
Immature Granulocytes: 0 %
Lymphocytes Relative: 11 %
Lymphs Abs: 0.8 10*3/uL (ref 0.7–4.0)
MCH: 28 pg (ref 26.0–34.0)
MCHC: 31 g/dL (ref 30.0–36.0)
MCV: 90.5 fL (ref 80.0–100.0)
Monocytes Absolute: 1.1 10*3/uL — ABNORMAL HIGH (ref 0.1–1.0)
Monocytes Relative: 16 %
Neutro Abs: 5 10*3/uL (ref 1.7–7.7)
Neutrophils Relative %: 73 %
Platelets: 157 10*3/uL (ref 150–400)
RBC: 4.96 MIL/uL (ref 4.22–5.81)
RDW: 13.2 % (ref 11.5–15.5)
WBC: 6.9 10*3/uL (ref 4.0–10.5)
nRBC: 0 % (ref 0.0–0.2)

## 2020-01-10 LAB — BLOOD GAS, ARTERIAL
Acid-Base Excess: 3.4 mmol/L — ABNORMAL HIGH (ref 0.0–2.0)
Allens test (pass/fail): POSITIVE — AB
Bicarbonate: 30.4 mmol/L — ABNORMAL HIGH (ref 20.0–28.0)
FIO2: 35
MECHVT: 500 mL
O2 Saturation: 96.4 %
PEEP: 5 cmH2O
Patient temperature: 37
RATE: 28 resp/min
pCO2 arterial: 55 mmHg — ABNORMAL HIGH (ref 32.0–48.0)
pH, Arterial: 7.35 (ref 7.350–7.450)
pO2, Arterial: 89 mmHg (ref 83.0–108.0)

## 2020-01-10 LAB — BASIC METABOLIC PANEL
Anion gap: 7 (ref 5–15)
BUN: 57 mg/dL — ABNORMAL HIGH (ref 6–20)
CO2: 28 mmol/L (ref 22–32)
Calcium: 9 mg/dL (ref 8.9–10.3)
Chloride: 109 mmol/L (ref 98–111)
Creatinine, Ser: 1.72 mg/dL — ABNORMAL HIGH (ref 0.61–1.24)
GFR, Estimated: 48 mL/min — ABNORMAL LOW (ref >60)
Glucose, Bld: 130 mg/dL — ABNORMAL HIGH (ref 70–99)
Potassium: 5.3 mmol/L — ABNORMAL HIGH (ref 3.5–5.1)
Sodium: 144 mmol/L (ref 135–145)

## 2020-01-10 LAB — GLUCOSE, CAPILLARY
Glucose-Capillary: 115 mg/dL — ABNORMAL HIGH (ref 70–99)
Glucose-Capillary: 115 mg/dL — ABNORMAL HIGH (ref 70–99)
Glucose-Capillary: 124 mg/dL — ABNORMAL HIGH (ref 70–99)
Glucose-Capillary: 147 mg/dL — ABNORMAL HIGH (ref 70–99)
Glucose-Capillary: 128 mg/dL — ABNORMAL HIGH (ref 70–99)
Glucose-Capillary: 131 mg/dL — ABNORMAL HIGH (ref 70–99)

## 2020-01-10 LAB — PHOSPHORUS: Phosphorus: 3.8 mg/dL (ref 2.5–4.6)

## 2020-01-10 LAB — MAGNESIUM: Magnesium: 2.5 mg/dL — ABNORMAL HIGH (ref 1.7–2.4)

## 2020-01-10 LAB — TRIGLYCERIDES: Triglycerides: 152 mg/dL — ABNORMAL HIGH (ref <150)

## 2020-01-10 MED ORDER — ACETAMINOPHEN 325 MG PO TABS: 650.0000 mg | ORAL_TABLET | Freq: Four times a day (QID) | ORAL | Status: DC | PRN

## 2020-01-10 MED ORDER — ONDANSETRON HCL 4 MG/2ML IJ SOLN
4.0000 mg | Freq: Four times a day (QID) | INTRAMUSCULAR | Status: DC | PRN
  Administered 2020-01-17: 18:00:00 4 mg via INTRAVENOUS
  Filled 2020-01-10: qty 2

## 2020-01-10 MED ORDER — ONDANSETRON HCL 4 MG PO TABS: 4.0000 mg | ORAL_TABLET | Freq: Four times a day (QID) | ORAL | Status: DC | PRN

## 2020-01-10 MED ORDER — ACETAMINOPHEN 650 MG RE SUPP: 650.0000 mg | Freq: Four times a day (QID) | RECTAL | Status: DC | PRN

## 2020-01-10 NOTE — Progress Notes (Signed)
[place], [place] 01/10/20  Subjective:   Hospital day # 8 Patient had good urine output at 3.2 L yesterday. Creatinine down to 1.72.  Renal: 12/04 0701 - 12/05 0700 In: 772.1 [I.V.:672.1; IV Piggyback:100] Out: 1410 [Urine:3275] Lab Results  Component Value Date   CREATININE 1.72 (H) 01/10/2020   CREATININE 2.03 (H) 01/09/2020   CREATININE 2.14 (H) 01/08/2020   CREATININE 2.07 (H) 01/08/2020     Objective:  Vital signs in last 24 hours:  Temp:  [97.9 F (36.6 C)-100 F (37.8 C)] 100 F (37.8 C) (12/05 0900) Pulse Rate:  [64-93] 82 (12/05 0900) Resp:  [16-33] 33 (12/05 0900) BP: (117-163)/(69-120) 163/102 (12/05 0900) SpO2:  [90 %-100 %] 94 % (12/05 0900) FiO2 (%):  [35 %] 35 % (12/05 0853) Weight:  [118.7 kg] 118.7 kg (12/05 0500)  Weight change: -0.5 kg Filed Weights   01/08/20 0358 01/09/20 0500 01/10/20 0500  Weight: 120.7 kg 119.2 kg 118.7 kg    Intake/Output:    Intake/Output Summary (Last 24 hours) at 01/10/2020 3013 Last data filed at 01/10/2020 0700 Gross per 24 hour  Intake 728.07 ml  Output 2950 ml  Net -2221.93 ml     Physical Exam: General: Critically ill appearing  HEENT ETT in place  Pulm/lungs Vent assisted, scattered rhonchi  CVS/Heart S1S2 no rubs  Abdomen:  Soft nontender, nondistended  Extremities: 1+ lower extremity edema  Neurologic: On sedation  Skin: warm  GU: Foley in place with clear yellow urine       Basic Metabolic Panel:  Recent Labs  Lab 01/06/20 0544 01/06/20 0544 01/07/20 0621 01/07/20 0621 01/08/20 0451 01/09/20 0433 01/10/20 0506  NA 143  --  143  --  143 147* 144  K 4.0  --  3.8  --  4.0 4.9 5.3*  CL 101  --  103  --  106 109 109  CO2 32  --  30  --  _0 GLUCOSE 112*  --  127*  --  108* 125* 130*  BUN 67*  --  74*  --  74* 72* 57*  CREATININE 2.38*  --  2.34*  --  2.07*  2.14* 2.03* 1.72*  CALCIUM 8.2*   < > 8.1*   < > 8.1* 8.6* 9.0  MG 2.7*  --  2.9*  --  2.7*  2.5* 2.5*  PHOS 4.1  --  5.0*  --  4.8* 4.4 3.8   < > = values in this interval not displayed.     CBC: Recent Labs  Lab 01/06/20 0544 01/07/20 0621 01/08/20 0451 01/09/20 0433 01/10/20 0506  WBC 6.2 5.2 4.3 5.7 6.9  NEUTROABS 3.8 2.8 2.1 3.7 5.0  HGB 13.1 13.4 12.5* 13.5 13.9  HCT 41.6 43.4 40.7 43.3 44.9  MCV 90.8 92.1 93.6 92.3 90.5  PLT 173 153 150 157 157      Lab Results  Component Value Date   HEPBSAG NON REACTIVE 01/03/2020   HEPBSAB NON REACTIVE 01/03/2020   HEPBIGM NON REACTIVE 01/03/2020      Microbiology:  Recent Results (from the past 240 hour(s))  Resp Panel by RT-PCR (Flu A&B, Covid) Nasopharyngeal Swab     Status: None   Collection Time: 01/01/20  3:13 PM   Specimen: Nasopharyngeal Swab; Nasopharyngeal(NP) swabs in vial transport medium  Result Value Ref Range Status   SARS Coronavirus 2 by RT PCR NEGATIVE NEGATIVE Final    Comment: (NOTE) SARS-CoV-2 target nucleic acids are NOT  DETECTED.  The SARS-CoV-2 RNA is generally detectable in upper respiratory specimens during the acute phase of infection. The lowest concentration of SARS-CoV-2 viral copies this assay can detect is 138 copies/mL. A negative result does not preclude SARS-Cov-2 infection and should not be used as the sole basis for treatment or other patient management decisions. A negative result may occur with  improper specimen collection/handling, submission of specimen other than nasopharyngeal swab, presence of viral mutation(s) within the areas targeted by this assay, and inadequate number of viral copies(<138 copies/mL). A negative result must be combined with clinical observations, patient history, and epidemiological information. The expected result is Negative.  Fact Sheet for Patients:  EntrepreneurPulse.com.au  Fact Sheet for Healthcare Providers:  IncredibleEmployment.be  This test is no t yet approved or cleared by the [place]  FDA and  has been authorized for detection and/or diagnosis of SARS-CoV-2 by FDA under an Emergency Use Authorization (EUA). This EUA will remain  in effect (meaning this test can be used) for the duration of the COVID-19 declaration under Section 564(b)(1) of the Act, 21 U.S.C.section 360bbb-3(b)(1), unless the authorization is terminated  or revoked sooner.       Influenza A by PCR NEGATIVE NEGATIVE Final   Influenza B by PCR NEGATIVE NEGATIVE Final    Comment: (NOTE) The Xpert Xpress SARS-CoV-2/FLU/RSV plus assay is intended as an aid in the diagnosis of influenza from Nasopharyngeal swab specimens and should not be used as a sole basis for treatment. Nasal washings and aspirates are unacceptable for Xpert Xpress SARS-CoV-2/FLU/RSV testing.  Fact Sheet for Patients: EntrepreneurPulse.com.au  Fact Sheet for Healthcare Providers: IncredibleEmployment.be  This test is not yet approved or cleared by the [place] FDA and has been authorized for detection and/or diagnosis of SARS-CoV-2 by FDA under an Emergency Use Authorization (EUA). This EUA will remain in effect (meaning this test can be used) for the duration of the COVID-19 declaration under Section 564(b)(1) of the Act, 21 U.S.C. section 360bbb-3(b)(1), unless the authorization is terminated or revoked.  Performed at [facility], [address] [place], [place] [address]   MRSA PCR Screening     Status: None   Collection Time: 01/02/20  2:45 PM   Specimen: Nasopharyngeal  Result Value Ref Range Status   MRSA by PCR NEGATIVE NEGATIVE Final    Comment:        The GeneXpert MRSA Assay (FDA approved for NASAL specimens only), is one component of a comprehensive MRSA colonization surveillance program. It is not intended to diagnose MRSA infection nor to guide or monitor treatment for MRSA infections. Performed at [facility], [address]  [place], [place] [address]   Culture, respiratory     Status: None   Collection Time: 01/02/20  5:00 PM   Specimen: INDUCED SPUTUM  Result Value Ref Range Status   Specimen Description   Final    INDUCED SPUTUM Performed at [facility], [address] [place], [place] [address]    Special Requests   Final    NONE Performed at [facility], [address] [place], [place] [address]    Gram Stain   Final    NO WBC SEEN RARE GRAM POSITIVE COCCI IN PAIRS IN CHAINS    Culture   Final    FEW Normal respiratory flora-no Staph aureus or Pseudomonas seen Performed at [facility], [address] [place], [place] [address]    Report Status 01/05/2020 FINAL  Final  Culture, respiratory     Status: None   Collection Time: 01/04/20 11:14 AM   Specimen: Tracheal Aspirate; Respiratory  Result Value Ref Range Status   Specimen Description   Final    TRACHEAL ASPIRATE Performed at [facility] [address] [place], [place] [address]    Special Requests   Final    NONE Performed at [facility], [address] [place], [place] [address]    Gram Stain   Final    FEW WBC PRESENT, PREDOMINANTLY PMN NO ORGANISMS SEEN    Culture   Final    NO GROWTH 2 DAYS Performed at [place] [address] [place], [place] [address]    Report Status 01/07/2020 FINAL  Final  Culture, respiratory (non-expectorated)     Status: None (Preliminary result)   Collection Time: 01/09/20  7:55 PM   Specimen: Tracheal Aspirate; Respiratory  Result Value Ref Range Status   Specimen Description   Final    TRACHEAL ASPIRATE Performed at [facility], [address] [place], [place] [address]    Special Requests   Final    NONE Performed at [facility], [address] [place], [place] [address]    Gram Stain   Final    RARE WBC PRESENT,BOTH PMN AND MONONUCLEAR RARE GRAM POSITIVE COCCI IN PAIRS IN CHAINS RARE GRAM NEGATIVE  RODS Performed at [facility], [place] [address] [place], [place] [address]    Culture PENDING  Incomplete   Report Status PENDING  Incomplete    Coagulation Studies: No results for input(s): LABPROT, INR in the last 72 hours.  Urinalysis: No results for input(s): COLORURINE, LABSPEC, PHURINE, GLUCOSEU, HGBUR, BILIRUBINUR, KETONESUR, PROTEINUR, UROBILINOGEN, NITRITE, LEUKOCYTESUR in the last 72 hours.  Invalid input(s): APPERANCEUR    Imaging: DG Chest Port 1 View  Result Date: 01/09/2020 CLINICAL DATA:  Respiratory failure EXAM: PORTABLE CHEST 1 VIEW COMPARISON:  Chest x-rays dated 01/07/2020 and 01/04/2020 FINDINGS: Endotracheal tube appears adequately positioned with tip approximately 5 cm above the carina. Enteric tube passes below the diaphragm. Increased airspace opacity at the RIGHT lung base, pneumonia versus atelectasis, superimposed on chronic scarring/fibrosis. Additional patchy scarring/fibrosis within each lung, stable. No pleural effusion or pneumothorax is seen. Heart size and mediastinal contours are stable. Visualized osseous structures about the chest are unremarkable. IMPRESSION: 1. Increased airspace opacity at the RIGHT lung base, pneumonia versus atelectasis, superimposed on chronic scarring/fibrosis. 2. Endotracheal tube appears adequately positioned with tip approximately 5 cm above the carina. Electronically Signed   By: Franki Cabot M.D.   On: 01/09/2020 05:17     Medications:   . sodium chloride    . sodium chloride    . dexmedetomidine (PRECEDEX) IV infusion 1.201 mcg/kg/hr (01/10/20 0759)  . famotidine (PEPCID) IV Stopped (01/09/20 2219)  . fentaNYL infusion INTRAVENOUS Stopped (01/09/20 1519)   . acetylcysteine  4 mL Nebulization BID  . apixaban  5 mg Per Tube BID  . budesonide (PULMICORT) nebulizer solution  0.5 mg Nebulization BID  . chlorhexidine gluconate (MEDLINE KIT)  15 mL Mouth Rinse BID  . Chlorhexidine Gluconate Cloth  6 each Topical  Q0600  . docusate  100 mg Per Tube BID  . feeding supplement (PROSource TF)  90 mL Per Tube QID  . feeding supplement (VITAL HIGH PROTEIN)  1,000 mL Per Tube Q24H  . free water  200 mL Per Tube Q4H  . insulin aspart  0-20 Units Subcutaneous TID WC  .  ipratropium-albuterol  3 mL Nebulization Q6H  . mouth rinse  15 mL Mouth Rinse 10 times per day  . metoCLOPramide (REGLAN) injection  5 mg Intravenous Q8H  . multivitamin with minerals  1 tablet Per Tube Daily  . polyethylene glycol  17 g Per Tube Daily   acetaminophen **OR** acetaminophen, albuterol, fentaNYL, metoprolol tartrate, midazolam, ondansetron **OR** ondansetron (ZOFRAN) IV  Assessment/ Plan:  49 y.o. male with     admitted on 01/01/2020 for Shortness of breath [R06.02] Hypercapnia [R06.89] COPD exacerbation (HCC) [J44.1] Acute respiratory failure with hypoxia and hypercapnia (HCC) [J96.01, J96.02] Acute and chr resp failure, unsp w hypoxia or hypercapnia ([place]) [J96.20]   #. Acute kidney injury on chronic kidney disease stage IIIA with proteinuria: baseline creatinine of 1.41, GFR of 58 on 08/01/19.  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  Acute kidney injury secondary to ATN from hypotension and hypoxia Nonoliguric urine output. No IV contrast exposure.  Lab Results  Component Value Date   CREATININE 1.72 (H) 01/10/2020   CREATININE 2.03 (H) 01/09/2020   CREATININE 2.14 (H) 01/08/2020   CREATININE 2.07 (H) 01/08/2020   - Renal ultrasound- unremarkable , no obstruction  12/04 0701 - 12/05 0700 In: 772.1 [I.V.:672.1; IV Piggyback:100] Out: 1364 [Urine:3275]   -Renal function has improved.  Creatinine down to 1.7.  BUN also down to 57.  Good urine output at 3.2 L.  Continue to monitor.  #.   Acute respiratory failure: requiring intubation and mechanical ventilation.  Acute exacerbation of COPD and sarcoidosis. 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  -Patient  was on spontaneous mode earlier.  Tachypnea noted.  #. Diabetes mellitus type II with chronic kidney disease: insulin dependent.  Lab Results  Component Value Date   HGBA1C 6.5 (H) 01/02/2020  UPC 0.47    LOS: 8 Maheen Cwikla 12/5/20219:28 AM  [place], [place]  Note: This note was prepared with Dragon dictation. Any transcription errors are unintentional

## 2020-01-10 NOTE — Progress Notes (Signed)
Follow up - Critical Care Medicine Note  Patient Details:    [name] is an 49 y.o. 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 Previous h/o [facility] AND LTACH, admitted with acute on chronic mixed respiratory failure requiring intubation PCCM consulted.  Lines, Airways, Drains: Airway 8 mm (Active)  Secured at (cm) 26 cm 01/08/20 1600  Measured From Lips 01/08/20 1600  Secured Location Left 01/08/20 1504  Secured By [employer] 01/08/20 1600  Tube Holder Repositioned Yes 01/08/20 1504  Prone position No 01/08/20 1504  Head position Right 01/05/20 0440  Cuff Pressure (cm H2O) 32 cm H2O 01/08/20 1504  Site Condition Cool;Dry 01/08/20 1600     NG/OG Tube Orogastric Left mouth Xray Documented cm marking at nare/ corner of mouth 65 cm (Active)  Cm Marking at Nare/Corner of Mouth (if applicable) 65 cm 63/84/53 0800  Site Assessment Clean;Dry;Intact 01/08/20 0800  Ongoing Placement Verification No change in cm markings or external length of tube from initial placement 01/08/20 0800  Status Infusing tube feed 01/08/20 0800  Output (mL) 60 mL 01/06/20 0957     Urethral Catheter Reina RN Temperature probe 16 Fr. (Active)  Indication for Insertion or Continuance of Catheter Therapy based on hourly urine output monitoring and documentation for critical condition (NOT STRICT I&O) 01/08/20 0800  Site Assessment Clean;Intact 01/08/20 0800  Catheter Maintenance Bag below level of bladder;Catheter secured;Drainage bag/tubing not touching floor;Insertion date on drainage bag;No dependent loops;Seal intact 01/08/20 0800  Collection Container Standard drainage bag 01/08/20 0800  Securement Method Securing device (Describe) 01/08/20 0800   Urinary Catheter Interventions (if applicable) Unclamped 64/68/03 0800  Output (mL) 175 mL 01/08/20 1750    Anti-infectives:  Anti-infectives (From admission, onward)   Start     Dose/Rate Route Frequency Ordered Stop   01/05/20 1800  levofloxacin (LEVAQUIN) IVPB 750 mg        750 mg 100 mL/hr over 90 Minutes Intravenous Every 24 hours 01/05/20 1228 01/08/20 1912   01/04/20 1800  levofloxacin (LEVAQUIN) IVPB 750 mg  Status:  Discontinued        750 mg 100 mL/hr over 90 Minutes Intravenous Every 48 hours 01/03/20 0832 01/05/20 1228   01/02/20 1800  levofloxacin (LEVAQUIN) IVPB 500 mg  Status:  Discontinued        500 mg 100 mL/hr over 60 Minutes Intravenous Every 24 hours 01/02/20 1613 01/03/20 0832     Scheduled Meds: . acetylcysteine  4 mL Nebulization BID  . apixaban  5 mg Per Tube BID  . budesonide (PULMICORT) nebulizer solution  0.5 mg Nebulization BID  . chlorhexidine gluconate (MEDLINE KIT)  15 mL Mouth Rinse BID  . Chlorhexidine Gluconate Cloth  6 each Topical Q0600  . docusate  100 mg Per Tube BID  . feeding supplement (PROSource TF)  90 mL Per Tube QID  . feeding supplement (VITAL HIGH PROTEIN)  1,000 mL Per Tube Q24H  . free water  200 mL Per Tube Q4H  . insulin aspart  0-20 Units Subcutaneous TID WC  . ipratropium-albuterol  3 mL Nebulization Q6H  . mouth rinse  15 mL Mouth Rinse 10 times per day  . metoCLOPramide (REGLAN) injection  5 mg Intravenous Q8H  . multivitamin with minerals  1 tablet Per Tube Daily  . polyethylene glycol  17 g Per Tube Daily   Continuous Infusions: . sodium chloride    . sodium chloride    . dexmedetomidine (PRECEDEX) IV infusion 1.2 mcg/kg/hr (01/10/20 1058)  . famotidine (PEPCID) IV 20 mg (01/10/20 1443)  . fentaNYL infusion INTRAVENOUS Stopped (01/09/20 1519)   PRN Meds:.acetaminophen **OR** acetaminophen, albuterol, fentaNYL, metoprolol tartrate, midazolam, ondansetron **OR** ondansetron (ZOFRAN) IV  Microbiology: Results for  orders placed or performed during the hospital encounter of 01/01/20  Resp Panel by RT-PCR (Flu A&B, Covid) Nasopharyngeal Swab     Status: None   Collection Time: 01/01/20  3:13 PM   Specimen: Nasopharyngeal Swab; Nasopharyngeal(NP) swabs in vial transport medium  Result Value Ref Range Status   SARS Coronavirus 2 by RT PCR NEGATIVE NEGATIVE Final    Comment: (NOTE) SARS-CoV-2 target nucleic acids are NOT DETECTED.  The SARS-CoV-2 RNA is generally detectable in upper respiratory specimens during the acute phase of infection. The lowest concentration of SARS-CoV-2 viral copies this assay can detect is 138 copies/mL. A negative result does not preclude SARS-Cov-2 infection and should not be used as the sole basis for treatment or other patient management decisions. A negative result may occur with  improper specimen collection/handling, submission of specimen other than nasopharyngeal swab, presence of viral mutation(s) within the areas targeted by this assay, and inadequate number of viral copies(<138 copies/mL). A negative result must be combined with clinical observations, patient history, and epidemiological information. The expected result is Negative.  Fact Sheet for Patients:  EntrepreneurPulse.com.au  Fact Sheet for Healthcare Providers:  IncredibleEmployment.be  This test is no t yet approved or cleared by the [place] FDA and  has been authorized for detection and/or diagnosis of SARS-CoV-2 by FDA under an Emergency Use Authorization (EUA). This EUA will remain  in effect (meaning this test can be used) for the duration of the COVID-19 declaration under Section 564(b)(1) of the Act, 21 U.S.C.section 360bbb-3(b)(1), unless the authorization is terminated  or revoked sooner.       Influenza A by PCR NEGATIVE NEGATIVE Final   Influenza B by PCR NEGATIVE NEGATIVE Final    Comment: (NOTE) The Xpert Xpress SARS-CoV-2/FLU/RSV plus  assay is intended as an aid in the diagnosis of influenza from Nasopharyngeal swab specimens and should not be used as a sole basis for treatment. Nasal washings and aspirates are unacceptable for Xpert Xpress SARS-CoV-2/FLU/RSV testing.  Fact Sheet for Patients: EntrepreneurPulse.com.au  Fact Sheet for Healthcare Providers: IncredibleEmployment.be  This test is not yet approved or cleared by the [place] FDA and has been authorized for detection and/or diagnosis of SARS-CoV-2 by FDA under an Emergency Use Authorization (EUA). This EUA will remain in effect (meaning this test can be used) for the duration of the COVID-19 declaration under Section 564(b)(1) of the Act, 21 U.S.C. section 360bbb-3(b)(1), unless the authorization is terminated or revoked.  Performed at [facility], [address] [place], [place] [address]   MRSA PCR Screening     Status: None   Collection Time: 01/02/20  2:45 PM   Specimen: Nasopharyngeal  Result Value Ref Range Status   MRSA by PCR NEGATIVE NEGATIVE Final    Comment:        The GeneXpert MRSA Assay (FDA approved for NASAL specimens only), is one component of a comprehensive MRSA colonization surveillance program. It is not intended to diagnose MRSA infection nor to guide or monitor treatment for MRSA infections. Performed at [facility], [address] [place], [place] [address]  Culture, respiratory     Status: None   Collection Time: 01/02/20  5:00 PM   Specimen: INDUCED SPUTUM  Result Value Ref Range Status   Specimen Description   Final    INDUCED SPUTUM Performed at [facility], [address], [place], [place] [address]    Special Requests   Final    NONE Performed at [facility], [address] [place], [place] [address]    Gram Stain   Final    NO WBC SEEN RARE GRAM POSITIVE COCCI IN PAIRS IN CHAINS    Culture   Final    FEW Normal  respiratory flora-no Staph aureus or Pseudomonas seen Performed at [facility], [address] [place], [place] [address]    Report Status 01/05/2020 FINAL  Final  Culture, respiratory     Status: None   Collection Time: 01/04/20 11:14 AM   Specimen: Tracheal Aspirate; Respiratory  Result Value Ref Range Status   Specimen Description   Final    TRACHEAL ASPIRATE Performed at [facility], [address] [place], [place] [address]    Special Requests   Final    NONE Performed at [facility], [address] [place], [place] [address]    Gram Stain   Final    FEW WBC PRESENT, PREDOMINANTLY PMN NO ORGANISMS SEEN    Culture   Final    NO GROWTH 2 DAYS Performed at [facility] [address], [place], [place] [address]    Report Status 01/07/2020 FINAL  Final  Culture, respiratory (non-expectorated)     Status: None (Preliminary result)   Collection Time: 01/09/20  7:55 PM   Specimen: Tracheal Aspirate; Respiratory  Result Value Ref Range Status   Specimen Description   Final    TRACHEAL ASPIRATE Performed at [facility], [address] [place], [place] [address]    Special Requests   Final    NONE Performed at [facility], [address] [place], [place] [address]    Gram Stain   Final    RARE WBC PRESENT,BOTH PMN AND MONONUCLEAR RARE GRAM POSITIVE COCCI IN PAIRS IN CHAINS RARE GRAM NEGATIVE RODS Performed at [facility], [place] [address], [place], [place] [address]    Culture PENDING  Incomplete   Report Status PENDING  Incomplete     Results for orders placed or performed during the hospital encounter of 01/01/20 (from the past 24 hour(s))  Glucose, capillary     Status: Abnormal   Collection Time: 01/09/20  4:17 PM  Result Value Ref Range   Glucose-Capillary 174 (H) 70 - 99 mg/dL  Glucose, capillary     Status: Abnormal   Collection Time: 01/09/20  7:41 PM  Result Value Ref Range    Glucose-Capillary 110 (H) 70 - 99 mg/dL  Culture, respiratory (non-expectorated)     Status: None (Preliminary result)   Collection Time: 01/09/20  7:55 PM   Specimen: Tracheal Aspirate; Respiratory  Result Value Ref Range   Specimen Description      TRACHEAL ASPIRATE Performed at [facility], [address] [place], [place] [address]    Special Requests      NONE Performed at [facility], [place], [place] [address]    Gram Stain      RARE WBC PRESENT,BOTH PMN AND MONONUCLEAR RARE GRAM POSITIVE COCCI IN PAIRS IN CHAINS RARE GRAM NEGATIVE RODS Performed at [facility], [address], [place], [place] [address]    Culture PENDING    Report Status PENDING   Glucose, capillary     Status: Abnormal   Collection Time: 01/09/20 11:37 PM  Result Value Ref Range   Glucose-Capillary 120 (H) 70 - 99 mg/dL  Glucose, capillary     Status: Abnormal   Collection Time: 01/10/20  3:16 AM  Result Value Ref Range   Glucose-Capillary 115 (H) 70 - 99 mg/dL  CBC with Differential/Platelet     Status: Abnormal   Collection Time: 01/10/20  5:06 AM  Result Value Ref Range   WBC 6.9 4.0 - 10.5 K/uL   RBC 4.96 4.22 - 5.81 MIL/uL   Hemoglobin 13.9 13.0 - 17.0 g/dL   HCT 44.9 39 - 52 %   MCV 90.5 80.0 - 100.0 fL   MCH 28.0 26.0 - 34.0 pg   MCHC 31.0 30.0 - 36.0 g/dL   RDW 13.2 11.5 - 15.5 %   Platelets 157 150 - 400 K/uL   nRBC 0.0 0.0 - 0.2 %   Neutrophils Relative % 73 %   Neutro Abs 5.0 1.7 - 7.7 K/uL   Lymphocytes Relative 11 %   Lymphs Abs 0.8 0.7 - 4.0 K/uL   Monocytes Relative 16 %   Monocytes Absolute 1.1 (H) 0.1 - 1.0 K/uL   Eosinophils Relative 0 %   Eosinophils Absolute 0.0 0.0 - 0.5 K/uL   Basophils Relative 0 %   Basophils Absolute 0.0 0.0 - 0.1 K/uL   Immature Granulocytes 0 %   Abs Immature Granulocytes 0.03 0.00 - 0.07 K/uL  Basic metabolic panel     Status: Abnormal   Collection Time: 01/10/20  5:06 AM  Result Value Ref Range    Sodium 144 135 - 145 mmol/L   Potassium 5.3 (H) 3.5 - 5.1 mmol/L   Chloride 109 98 - 111 mmol/L   CO2 28 22 - 32 mmol/L   Glucose, Bld 130 (H) 70 - 99 mg/dL   BUN 57 (H) 6 - 20 mg/dL   Creatinine, Ser 1.72 (H) 0.61 - 1.24 mg/dL   Calcium 9.0 8.9 - 10.3 mg/dL   GFR, Estimated 48 (L) >60 mL/min   Anion gap 7 5 - 15  Magnesium     Status: Abnormal   Collection Time: 01/10/20  5:06 AM  Result Value Ref Range   Magnesium 2.5 (H) 1.7 - 2.4 mg/dL  Phosphorus     Status: None   Collection Time: 01/10/20  5:06 AM  Result Value Ref Range   Phosphorus 3.8 2.5 - 4.6 mg/dL  Glucose, capillary     Status: Abnormal   Collection Time: 01/10/20  7:18 AM  Result Value Ref Range   Glucose-Capillary 115 (H) 70 - 99 mg/dL  Glucose, capillary     Status: Abnormal   Collection Time: 01/10/20  8:41 AM  Result Value Ref Range   Glucose-Capillary 124 (H) 70 - 99 mg/dL  Glucose, capillary     Status: Abnormal   Collection Time: 01/10/20 11:22 AM  Result Value Ref Range   Glucose-Capillary 147 (H) 70 - 99 mg/dL      Best Practice/Protocols:  VTE Prophylaxis: Direct Thrombin Inhibitor GI Prophylaxis: Antihistamine Intermittent Sedation Weaning  Events: 11/27 ICU admission, INTUBATED #8ETT 11/28 severe resp failure, progressive Renal failure, Nephrology consulted 11/29 - patient passed SBT today, able to communicate/follow directions while intubated status post extubation to BIPAP - after 1 hour on BIPAP patient became unresponsive and  required re-intubation. I have met with mother in law at bedside and reviewed careplan.  11/30- patient remains on ventilator.  He has thickened mucopurulent tracheal secretions, he can follow verbal instructions but failed SBT today. GPC+ tracheal aspirate culture. 01/06/20- SBT for extubation, failed with heavy secretions.  TOC consult today.  01/07/20- patient failed SBT after 10 minutes with acute hypoxemia.  PALS consult for VDRF.  Spoke with Wife Sharl Ma about  trache she wants to wait for several days 01/08/20: Palliative care discussing with family, likelihood patient will remain ventilator dependent, continues to fail SBT's has had prior episode of trach and LTACH stay. 01/10/2020: Tolerating SBT's better on current sedation Precedex/fentanyl- calm, communicates with ICU communication board tool  Studies: DG Chest 2 View  Result Date: 01/01/2020 CLINICAL DATA:  Chest pain EXAM: CHEST - 2 VIEW COMPARISON:  07/31/2019 FINDINGS: Linear areas of scarring within the lungs bilaterally. Heart is normal size. No acute confluent opacities or effusions. No acute bony abnormality. IMPRESSION: Areas of scarring bilaterally, stable.  No active disease. Electronically Signed   By: Rolm Baptise M.D.   On: 01/01/2020 11:42   DG Abd 1 View  Result Date: 01/04/2020 CLINICAL DATA:  Status post orogastric tube placement. EXAM: ABDOMEN - 1 VIEW COMPARISON:  February 11, 2015. FINDINGS: The bowel gas pattern is normal. Distal tip of enteric tube is seen in proximal stomach. No radio-opaque calculi or other significant radiographic abnormality are seen. IMPRESSION: Distal tip of enteric tube seen in proximal stomach. Electronically Signed   By: Marijo Conception M.D.   On: 01/04/2020 11:39   US RENAL  Result Date: 01/03/2020 CLINICAL DATA:  Acute kidney failure EXAM: RENAL ULTRASOUND COMPARISON:  None. FINDINGS: Right Kidney: Renal measurements: 9.7 x 7.0 x 5.4 cm = volume: 193 mL. Echogenicity and renal cortical thickness are within normal limits. No mass, perinephric fluid, or hydronephrosis visualized. No sonographically demonstrable calculus or ureterectasis. Left Kidney: Renal measurements: 9.9 x 4.9 x 6.1 cm = volume: 158 mL. Echogenicity and renal cortical thickness are within normal limits. No mass, perinephric fluid, or hydronephrosis visualized. No sonographically demonstrable calculus or ureterectasis. Bladder: Foley catheter in place. Urinary bladder decompressed and  cannot be assessed. Other: None. IMPRESSION: Kidneys appear unremarkable bilaterally. Urinary bladder decompressed with Foley catheter and cannot be assessed at this time. Electronically Signed   By: Lowella Grip III M.D.   On: 01/03/2020 11:11   DG Chest Port 1 View  Result Date: 01/09/2020 CLINICAL DATA:  Respiratory failure EXAM: PORTABLE CHEST 1 VIEW COMPARISON:  Chest x-rays dated 01/07/2020 and 01/04/2020 FINDINGS: Endotracheal tube appears adequately positioned with tip approximately 5 cm above the carina. Enteric tube passes below the diaphragm. Increased airspace opacity at the RIGHT lung base, pneumonia versus atelectasis, superimposed on chronic scarring/fibrosis. Additional patchy scarring/fibrosis within each lung, stable. No pleural effusion or pneumothorax is seen. Heart size and mediastinal contours are stable. Visualized osseous structures about the chest are unremarkable. IMPRESSION: 1. Increased airspace opacity at the RIGHT lung base, pneumonia versus atelectasis, superimposed on chronic scarring/fibrosis. 2. Endotracheal tube appears adequately positioned with tip approximately 5 cm above the carina. Electronically Signed   By: Franki Cabot M.D.   On: 01/09/2020 05:17   DG Chest Port 1 View  Result Date: 01/07/2020 CLINICAL DATA:  Hypoxia EXAM: PORTABLE CHEST 1 VIEW COMPARISON:  Radiograph 01/04/2020 FINDINGS: Endotracheal tube terminates in the mid trachea, 5 cm from the carina. Transesophageal tube tip and side port terminate below level of imaging,  beyond the GE junction. Telemetry leads overlie the chest. There redemonstrated areas of bandlike opacity throughout both lungs with associated bronchiectatic features and architectural distortion most compatible with regions of scarring seen on comparison CT. Low volumes and atelectatic changes are slightly more pronounced than on comparison as well with some developing hazy opacity in the left mid to lower lung and right lung base.  Early edema not excluded with central vascular congestion. Cardiomediastinal contours are stable from prior. No acute osseous or soft tissue abnormality. IMPRESSION: 1. Diminished volumes with developing hazy opacity in the left mid to lower lung and right lung base, could atelectatic change versus edema or early infection. Correlate with clinical features. 2. Chronic bilateral scarring and bronchiectatic features. 3. Satisfactory positioning of lines and tubes. Electronically Signed   By: Lovena Le M.D.   On: 01/07/2020 21:11   DG Chest Port 1 View  Result Date: 01/04/2020 CLINICAL DATA:  Status post intubation. EXAM: PORTABLE CHEST 1 VIEW COMPARISON:  Same day. FINDINGS: The heart size and mediastinal contours are within normal limits. Endotracheal and nasogastric tubes are unchanged in position. No pneumothorax is noted. Stable bilateral lung opacities are noted most consistent with scarring, with probable small left pleural effusion. The visualized skeletal structures are unremarkable. IMPRESSION: Stable support apparatus. Stable bilateral lung opacities are noted most consistent with scarring, with probable small left pleural effusion. Electronically Signed   By: Marijo Conception M.D.   On: 01/04/2020 11:38   DG Chest Port 1 View  Result Date: 01/04/2020 CLINICAL DATA:  Respiratory failure EXAM: PORTABLE CHEST 1 VIEW COMPARISON:  01/03/2020 FINDINGS: Endotracheal tube is seen 5.1 cm above the carina. Nasogastric tube extends into the upper abdomen beyond the margin of the examination. Linear scarring within the right mid lung zone and left lung base are again identified. Mild asymmetric left-sided volume loss is unchanged. Tiny left pleural effusion or pleural scarring is seen at the left costophrenic angle. No superimposed confluent pulmonary infiltrate. No pneumothorax. Cardiac size within normal limits. Pulmonary vascularity is normal. IMPRESSION: Stable support tubes. Unchanged examination with  multifocal regions of parenchymal scarring and tiny left pleural effusion or pleural scarring. Electronically Signed   By: Fidela Salisbury MD   On: 01/04/2020 04:55   DG Chest Port 1 View  Result Date: 01/03/2020 CLINICAL DATA:  Intubation EXAM: PORTABLE CHEST 1 VIEW COMPARISON:  01/02/2020 FINDINGS: Endotracheal and enteric tubes are unchanged in position. Shallow inspiration with atelectasis in the lung bases. Blunting of the costophrenic angles suggests small effusions. Linear scarring in the right lung. No significant change. IMPRESSION: Stable appearance of the chest. Electronically Signed   By: Lucienne Capers M.D.   On: 01/03/2020 05:46   DG Chest Portable 1 View  Result Date: 01/02/2020 CLINICAL DATA:  OG placement. EXAM: PORTABLE CHEST 1 VIEW COMPARISON:  January 01, 2020 FINDINGS: Endotracheal tube in satisfactory position. Enteric catheter with side hole at the expected location of the GE junction. Cardiomediastinal silhouette is normal. Mediastinal contours appear intact. Chronic abnormal appearance of the lungs with bilateral areas of peribronchial scarring/atelectasis. Osseous structures are without acute abnormality. Soft tissues are grossly normal. IMPRESSION: 1. Endotracheal tube in satisfactory position. 2. Enteric catheter with side hole at the expected location of the GE junction. Advancement with may be considered. 3. Stable abnormal appearance of the lungs with peribronchial areas of scarring/atelectasis and chronic elevation of the left hemidiaphragm. Electronically Signed   By: Fidela Salisbury M.D.   On: 01/02/2020 13:57  Consults:    Subjective:    Overnight Issues: Overnight no issues.  Remains ventilator dependent.  Communicates with alphabet board.  Objective:  Vital signs for last 24 hours: Temp:  [97.9 F (36.6 C)-100.4 F (38 C)] 100.4 F (38 C) (12/05 1100) Pulse Rate:  [64-84] 83 (12/05 1100) Resp:  [16-33] 25 (12/05 1100) BP: (117-166)/(81-104)  150/87 (12/05 1100) SpO2:  [90 %-100 %] 97 % (12/05 1154) FiO2 (%):  [35 %] 35 % (12/05 1154) Weight:  [118.7 kg] 118.7 kg (12/05 0500)  Hemodynamic parameters for last 24 hours:    Intake/Output from previous day: 12/04 0701 - 12/05 0700 In: 772.1 [I.V.:672.1; IV Piggyback:100] Out: 3275 [Urine:3275]  Intake/Output this shift: Total I/O In: 69.6 [I.V.:69.6] Out: 800 [Urine:800]  Vent settings for last 24 hours: Vent Mode: PRVC FiO2 (%):  [35 %] 35 % Set Rate:  [28 bmp] 28 bmp Vt Set:  [500 mL] 500 mL PEEP:  [5 cmH20] 5 cmH20 Pressure Support:  [5 cmH20] 5 cmH20 Plateau Pressure:  [17 cmH20-18 cmH20] 17 cmH20  Physical Exam:  GENERAL: Obese male, intubated, calm, RASS 0, mechanically ventilated.  Does interact. HEAD: Normocephalic, atraumatic.  EYES: Pupils equal, round, reactive to light.  No scleral icterus.  MOUTH: Orotracheally intubated, OG in place. NECK: Supple, trachea midline. PULMONARY: Coarse breath sounds, no wheezes. CARDIOVASCULAR: S1 and S2. Regular rate and rhythm. No murmurs, rubs, or gallops.  GASTROINTESTINAL: Soft, non-distended.  Positive bowel sounds.   MUSCULOSKELETAL: +1 anasarca, no clubbing. NEUROLOGIC: Follows commands, calm, no overt focal deficit. SKIN:intact,warm,dry.  No skin issues noted.  Nutrition Status: Nutrition Problem: Inadequate oral intake Etiology: inability to eat Signs/Symptoms: NPO status Interventions: Tube feeding, Prostat, MVI      Assessment/Plan:  Synopsis: 49 yo AAM with severe morbid obesity with biventricular heart failure with acute on chronic resp failue with hypoxia and hypercapnia due to acute COPD exacerbation with acute CAP with sepsis, present on admission, with severe metabolic encephalopathy from hypoxia and hypercapnia with progressive renal failure.  Required intubation.  Acute on chronic respiratory failure (hypoxia/hypercarbia) Underlying severe COPD and sarcoidosis Atelectasis Continue  ventilator support Prior issues with respiratory failure prior trach and LTACH End-of-life discussions with family per Palliative Care Family still desires full code Tolerating SBT  today continue daily trials as tolerated Reassess early in week to see if may need repeat tracheostomy Initiated recruitment maneuvers for atelectasis Continue bronchodilators New infiltrate versus atelectasis right lower lobe noted 12/4 Collect sputum for C&S>> GPC's await ident. Ventilator sedation: Fentanyl/Precedex  Severe obstructive sleep apnea Chronic hypercapnic respiratory failure Noncompliant with noninvasive ventilator (Trilogy) at home This issue adds complexity to his management  Acute kidney injury on chronic kidney disease stage IIIa ATN/hypotension/hypoxia culprits Nephrology following appreciate input Avoid nephrotoxins when able Has nonoliguric urine output No indication for dialysis at present Continue to monitor Continue to hold ACE I and furosemide Foley catheter indicated currently, assess need on daily basis Developing hypernatremia, increase free water  Diabetes mellitus type 2 with chronic kidney disease Insulin requiring ICU hyperglycemia protocol    LOS: 8 days   Additional comments: Discussed during multidisciplinary rounds with ICU team  Critical Care Total Time*: 35 Minutes  C. Derrill Kay, MD [facility] 01/10/2020  *Care during the described time interval was provided by me and/or other providers on the critical care team.  I have reviewed this patient's available data, including medical history, events of note, physical examination and test results as part of my evaluation.  **  This note was dictated using voice recognition software/Dragon.  Despite best efforts to proofread, errors can occur which can change the meaning.  Any change was purely unintentional.

## 2020-01-10 NOTE — Consult Note (Signed)
PHARMACY CONSULT NOTE  Pharmacy Consult for Electrolyte Monitoring and Replacement   Recent Labs: Potassium (mmol/L)  Date Value  01/10/2020 5.3 (H)  05/28/2014 3.9   Magnesium (mg/dL)  Date Value  01/10/2020 2.5 (H)  05/14/2013 1.8   Calcium (mg/dL)  Date Value  01/10/2020 9.0   Calcium, Total (mg/dL)  Date Value  05/28/2014 8.4 (L)   Albumin (g/dL)  Date Value  01/08/2020 2.6 (L)  06/01/2013 3.8   Phosphorus (mg/dL)  Date Value  01/10/2020 3.8  03/09/2013 3.0   Sodium (mmol/L)  Date Value  01/10/2020 144  05/28/2014 140   Corrected Ca: 8.3 mg/dL  Assessment: 49 year old male with PMHx of HTN, DM, COPD, CAD, hx CVA, sarcoidosis, CHF, afib on Eliquis, CKD 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  12/1: Urine output 2150 mL yesterday 12/2: Urine output 1475 mL yesterday 12/3: Urine output 2100 mL yesterday  Goal of Therapy:  Potassium 4.0 - 5.1 mmol/L Magnesium 2.0 - 2.4 mg/dL Electrolytes WNL  Plan:   No electrolyte replacement indicated today  Reassess tomorrow with AM labs  Tawnya Crook, PharmD 01/10/2020 7:30 AM

## 2020-01-11 DIAGNOSIS — J9602 Acute respiratory failure with hypercapnia: Secondary | ICD-10-CM | POA: Diagnosis not present

## 2020-01-11 DIAGNOSIS — J9620 Acute and chronic respiratory failure, unspecified whether with hypoxia or hypercapnia: Secondary | ICD-10-CM | POA: Diagnosis not present

## 2020-01-11 DIAGNOSIS — I5042 Chronic combined systolic (congestive) and diastolic (congestive) heart failure: Secondary | ICD-10-CM | POA: Diagnosis not present

## 2020-01-11 DIAGNOSIS — J9601 Acute respiratory failure with hypoxia: Secondary | ICD-10-CM | POA: Diagnosis not present

## 2020-01-11 LAB — BASIC METABOLIC PANEL
Anion gap: 8 (ref 5–15)
BUN: 47 mg/dL — ABNORMAL HIGH (ref 6–20)
CO2: 29 mmol/L (ref 22–32)
Calcium: 8.8 mg/dL — ABNORMAL LOW (ref 8.9–10.3)
Chloride: 109 mmol/L (ref 98–111)
Creatinine, Ser: 1.54 mg/dL — ABNORMAL HIGH (ref 0.61–1.24)
GFR, Estimated: 55 mL/min — ABNORMAL LOW (ref >60)
Glucose, Bld: 128 mg/dL — ABNORMAL HIGH (ref 70–99)
Potassium: 4.8 mmol/L (ref 3.5–5.1)
Sodium: 146 mmol/L — ABNORMAL HIGH (ref 135–145)

## 2020-01-11 LAB — MAGNESIUM: Magnesium: 2.2 mg/dL (ref 1.7–2.4)

## 2020-01-11 LAB — GLUCOSE, CAPILLARY
Glucose-Capillary: 102 mg/dL — ABNORMAL HIGH (ref 70–99)
Glucose-Capillary: 111 mg/dL — ABNORMAL HIGH (ref 70–99)
Glucose-Capillary: 125 mg/dL — ABNORMAL HIGH (ref 70–99)
Glucose-Capillary: 126 mg/dL — ABNORMAL HIGH (ref 70–99)
Glucose-Capillary: 128 mg/dL — ABNORMAL HIGH (ref 70–99)
Glucose-Capillary: 151 mg/dL — ABNORMAL HIGH (ref 70–99)

## 2020-01-11 LAB — PHOSPHORUS: Phosphorus: 3.7 mg/dL (ref 2.5–4.6)

## 2020-01-11 MED ORDER — SENNOSIDES 8.8 MG/5ML PO SYRP
5.0000 mL | ORAL_SOLUTION | Freq: Two times a day (BID) | ORAL | Status: DC
  Administered 2020-01-11 – 2020-01-22 (×14): 5 mL
  Filled 2020-01-11 (×32): qty 5

## 2020-01-11 MED ORDER — MIDAZOLAM 50MG/50ML (1MG/ML) PREMIX INFUSION
0.5000 mg/h | INTRAVENOUS | Status: DC
  Administered 2020-01-11: 2 mg/h via INTRAVENOUS
  Administered 2020-01-11 – 2020-01-15 (×8): 4 mg/h via INTRAVENOUS
  Administered 2020-01-16 – 2020-01-17 (×2): 3 mg/h via INTRAVENOUS
  Filled 2020-01-11 (×10): qty 50

## 2020-01-11 MED ORDER — NOREPINEPHRINE 4 MG/250ML-% IV SOLN
2.0000 ug/min | INTRAVENOUS | Status: DC
  Administered 2020-01-11 – 2020-01-12 (×2): 2 ug/min via INTRAVENOUS
  Filled 2020-01-11: qty 250

## 2020-01-11 MED ORDER — SODIUM CHLORIDE 0.9 % IV SOLN
250.0000 mL | INTRAVENOUS | Status: DC
  Administered 2020-01-11: 250 mL via INTRAVENOUS

## 2020-01-11 MED ORDER — MIDODRINE HCL 5 MG PO TABS
10.0000 mg | ORAL_TABLET | Freq: Two times a day (BID) | ORAL | Status: DC
  Administered 2020-01-11 – 2020-01-12 (×2): 10 mg via ORAL
  Filled 2020-01-11 (×2): qty 2

## 2020-01-11 MED ORDER — VITAL HIGH PROTEIN PO LIQD
1000.0000 mL | ORAL | Status: DC
  Administered 2020-01-12 – 2020-01-17 (×5): 1000 mL

## 2020-01-11 MED ORDER — PROSOURCE TF PO LIQD
90.0000 mL | Freq: Two times a day (BID) | ORAL | Status: DC
  Administered 2020-01-11 – 2020-01-25 (×25): 90 mL
  Filled 2020-01-11 (×25): qty 90

## 2020-01-11 NOTE — Progress Notes (Signed)
CRITICAL CARE NOTE 49 y.o. 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  11/27 ICU admission, INTUBATED #[id number] 11/28 severe resp failure, progressive Renal failure, Nephrology consulted 11/29 - patient passed SBT today, able to communicate/follow directions while intubated status post extubation to BIPAP - after 1 hour on BIPAP patient became unresponsive and required re-intubation. I have met with mother in law at bedside and reviewed careplan.  11/30- patient remains on ventilator. He has thickened mucopurulent tracheal secretions, he can follow verbal instructions but failed SBT today. GPC+ tracheal aspirate culture. 01/06/20-SBT for extubation, failed with heavy secretions. TOC consult today.  01/07/20-patient failed SBT after 10 minutes with acute hypoxemia. PALS consult for VDRF. Spoke with Wife Patrice about trache she wants to wait for several days 01/08/20: Palliative care discussing with family, likelihood patient will remain ventilator dependent, continues to fail SBT's has had prior episode of trach and LTACH stay. 01/10/2020: Tolerating SBT's better on current sedation Precedex/fentanyl- calm, communicates with ICU communication board tool  CC  follow up respiratory failure  SUBJECTIVE Patient remains critically ill Prognosis is guarded   BP (!) 156/92   Pulse 76   Temp 100.2 F (37.9 C)   Resp (!) 28   Ht 6' 0.99" (1.854 m)   Wt 118.7 kg   SpO2 97%   BMI 34.53 kg/m    I/O last 3 completed shifts: In: 5672.4 [I.V.:1701; NG/GT:3821.3; IV Piggyback:150] Out: 4550 [Urine:4550] No intake/output data  recorded.  SpO2: 97 % O2 Flow Rate (L/min): 3 L/min FiO2 (%): 35 %  Estimated body mass index is 34.53 kg/m as calculated from the following:   Height as of this encounter: 6' 0.99" (1.854 m).   Weight as of this encounter: 118.7 kg.  SIGNIFICANT EVENTS   REVIEW OF SYSTEMS  PATIENT IS UNABLE TO PROVIDE COMPLETE REVIEW OF SYSTEMS DUE TO SEVERE CRITICAL ILLNESS        PHYSICAL EXAMINATION:  GENERAL:critically ill appearing, +resp distress HEAD: Normocephalic, atraumatic.  EYES: Pupils equal, round, reactive to light.  No scleral icterus.  MOUTH: Moist mucosal membrane. NECK: Supple.  PULMONARY: +rhonchi, +wheezing CARDIOVASCULAR: S1 and S2. Regular rate and rhythm. No murmurs, rubs, or gallops.  GASTROINTESTINAL: Soft, nontender, -distended.  Positive bowel sounds.   MUSCULOSKELETAL: No swelling, clubbing, or edema.  NEUROLOGIC: obtunded, GCS<8 SKIN:intact,warm,dry  MEDICATIONS: I have reviewed all medications and confirmed regimen as documented   CULTURE RESULTS   Recent Results (from the past 240 hour(s))  Resp Panel by RT-PCR (Flu A&B, Covid) Nasopharyngeal Swab     Status: None   Collection Time: 01/01/20  3:13 PM   Specimen: Nasopharyngeal Swab; Nasopharyngeal(NP) swabs in vial transport medium  Result Value Ref Range Status   SARS Coronavirus 2 by RT PCR NEGATIVE NEGATIVE Final    Comment: (NOTE) SARS-CoV-2 target nucleic acids are NOT DETECTED.  The SARS-CoV-2 RNA is generally detectable in upper respiratory specimens during the acute phase of infection. The lowest concentration of SARS-CoV-2 viral copies this assay can detect is 138 copies/mL. A negative result does not preclude SARS-Cov-2 infection and should not be used as the sole basis for treatment or other patient management decisions. A negative result may occur with  improper specimen collection/handling, submission of   specimen other than nasopharyngeal swab, presence of viral mutation(s) within  the areas targeted by this assay, and inadequate number of viral copies(<138 copies/mL). A negative result must be combined with clinical observations, patient history, and epidemiological information. The expected result is Negative.  Fact Sheet for Patients:  https://www.fda.gov/media/152166/download  Fact Sheet for Healthcare Providers:  https://www.fda.gov/media/152162/download  This test is no t yet approved or cleared by the [place] FDA and  has been authorized for detection and/or diagnosis of SARS-CoV-2 by FDA under an Emergency Use Authorization (EUA). This EUA will remain  in effect (meaning this test can be used) for the duration of the COVID-19 declaration under Section 564(b)(1) of the Act, 21 U.S.C.section 360bbb-3(b)(1), unless the authorization is terminated  or revoked sooner.       Influenza A by PCR NEGATIVE NEGATIVE Final   Influenza B by PCR NEGATIVE NEGATIVE Final    Comment: (NOTE) The Xpert Xpress SARS-CoV-2/FLU/RSV plus assay is intended as an aid in the diagnosis of influenza from Nasopharyngeal swab specimens and should not be used as a sole basis for treatment. Nasal washings and aspirates are unacceptable for Xpert Xpress SARS-CoV-2/FLU/RSV testing.  Fact Sheet for Patients: https://www.fda.gov/media/152166/download  Fact Sheet for Healthcare Providers: https://www.fda.gov/media/152162/download  This test is not yet approved or cleared by the [place] FDA and has been authorized for detection and/or diagnosis of SARS-CoV-2 by FDA under an Emergency Use Authorization (EUA). This EUA will remain in effect (meaning this test can be used) for the duration of the COVID-19 declaration under Section 564(b)(1) of the Act, 21 U.S.C. section 360bbb-3(b)(1), unless the authorization is terminated or revoked.  Performed at [facility], [address] [place], [place] [address]   MRSA PCR Screening     Status: None   Collection  Time: 01/02/20  2:45 PM   Specimen: Nasopharyngeal  Result Value Ref Range Status   MRSA by PCR NEGATIVE NEGATIVE Final    Comment:        The GeneXpert MRSA Assay (FDA approved for NASAL specimens only), is one component of a comprehensive MRSA colonization surveillance program. It is not intended to diagnose MRSA infection nor to guide or monitor treatment for MRSA infections. Performed at [facility], [address] [place], [place] [address]   Culture, respiratory     Status: None   Collection Time: 01/02/20  5:00 PM   Specimen: INDUCED SPUTUM  Result Value Ref Range Status   Specimen Description   Final    INDUCED SPUTUM Performed at [facility], [address] [place], [place] [address]    Special Requests   Final    NONE Performed at [facility], [address] [place], [place] [address]    Gram Stain   Final    NO WBC SEEN RARE GRAM POSITIVE COCCI IN PAIRS IN CHAINS    Culture   Final    FEW Normal respiratory flora-no Staph aureus or Pseudomonas seen Performed at  Hospital Lab, [address] [place], [place] [address]    Report Status 01/05/2020 FINAL  Final  Culture, respiratory     Status: None   Collection Time: 01/04/20 11:14 AM   Specimen: Tracheal Aspirate; Respiratory  Result Value Ref Range Status   Specimen Description   Final    TRACHEAL ASPIRATE Performed at [facility], [address] [place], [place] [address]    Special Requests   Final    NONE Performed at  Hospital Lab,   [place], [place] [address]    Gram Stain   Final    FEW WBC PRESENT, PREDOMINANTLY PMN NO ORGANISMS SEEN    Culture   Final    NO GROWTH 2 DAYS Performed at [facility] [address], [place], [place] [address]    Report Status 01/07/2020 FINAL  Final  Culture, respiratory (non-expectorated)     Status: None (Preliminary result)   Collection Time: 01/09/20  7:55 PM   Specimen:  Tracheal Aspirate; Respiratory  Result Value Ref Range Status   Specimen Description   Final    TRACHEAL ASPIRATE Performed at [facility], [address] [place], [place] [address]    Special Requests   Final    NONE Performed at [facility], [address] [place], [place] [address]    Gram Stain   Final    RARE WBC PRESENT,BOTH PMN AND MONONUCLEAR RARE GRAM POSITIVE COCCI IN PAIRS IN CHAINS RARE GRAM NEGATIVE RODS Performed at [facility], [place] [address], [place], [place] [address]    Culture PENDING  Incomplete   Report Status PENDING  Incomplete        CBC    Component Value Date/Time   WBC 6.9 01/10/2020 0506   RBC 4.96 01/10/2020 0506   HGB 13.9 01/10/2020 0506   HGB 15.1 05/28/2014 1140   HCT 44.9 01/10/2020 0506   HCT 45.2 05/28/2014 1140   PLT 157 01/10/2020 0506   PLT 185 05/28/2014 1140   MCV 90.5 01/10/2020 0506   MCV 83 05/28/2014 1140   MCH 28.0 01/10/2020 0506   MCHC 31.0 01/10/2020 0506   RDW 13.2 01/10/2020 0506   RDW 13.7 05/28/2014 1140   LYMPHSABS 0.8 01/10/2020 0506   LYMPHSABS 1.7 05/28/2014 1140   MONOABS 1.1 (H) 01/10/2020 0506   MONOABS 1.5 (H) 05/28/2014 1140   EOSABS 0.0 01/10/2020 0506   EOSABS 0.1 05/28/2014 1140   BASOSABS 0.0 01/10/2020 0506   BASOSABS 0.0 05/28/2014 1140   BMP Latest Ref Rng & Units 01/11/2020 01/10/2020 01/09/2020  Glucose 70 - 99 mg/dL 128(H) 130(H) 125(H)  BUN 6 - 20 mg/dL 47(H) 57(H) 72(H)  Creatinine 0.61 - 1.24 mg/dL 1.54(H) 1.72(H) 2.03(H)  Sodium 135 - 145 mmol/L 146(H) 144 147(H)  Potassium 3.5 - 5.1 mmol/L 4.8 5.3(H) 4.9  Chloride 98 - 111 mmol/L 109 109 109  CO2 22 - 32 mmol/L _0 Calcium 8.9 - 10.3 mg/dL 8.8(L) 9.0 8.6(L)      IMAGING    No results found.   Nutrition Status: Nutrition Problem: Inadequate oral intake Etiology: inability to eat Signs/Symptoms: NPO status Interventions: Tube feeding, Prostat, MVI     Indwelling Urinary Catheter  continued, requirement due to   Reason to continue Indwelling Urinary Catheter strict Intake/Output monitoring for hemodynamic instability   Central Line/ continued, requirement due to  Reason to continue [place] of central venous pressure or other hemodynamic parameters and poor IV access   Ventilator continued, requirement due to severe respiratory failure   Ventilator Sedation RASS 0 to -2      ASSESSMENT AND PLAN SYNOPSIS  49 yo AAM with severe morbid obesity with biventricular heart failure with acute on chronic resp failue with hypoxia and hypercapnia due to acute COPD exacerbation with acute CAP with sepsis, present on admission, with severe metabolic encephalopathy from hypoxia and hypercapnia with progressive renal failure.  Required intubation.  Severe ACUTE Hypoxic and Hypercapnic Respiratory  Failure -continue Full MV support -continue Bronchodilator Therapy -Wean Fio2 and PEEP as tolerated -VAP/VENT bundle implementation   ACUTE DIASTOLIC CARDIAC FAILURE- EF -oxygen as needed -Lasix as tolerated    Morbid obesity, possible OSA.   Will certainly impact respiratory mechanics, ventilator weaning Suspect will need to consider additional PEEP   ACUTE KIDNEY INJURY/Renal Failure -continue Foley Catheter-assess need -Avoid nephrotoxic agents -Follow urine output, BMP -Ensure adequate renal perfusion, optimize oxygenation -Renal dose medications     NEUROLOGY Acute toxic metabolic encephalopathy, need for sedation Goal RASS -2 to -3   CARDIAC ICU monitoring  ID -continue IV abx as prescibed -follow up cultures  GI GI PROPHYLAXIS as indicated  NUTRITIONAL STATUS Nutrition Status: Nutrition Problem: Inadequate oral intake Etiology: inability to eat Signs/Symptoms: NPO status Interventions: Tube feeding, Prostat, MVI   DIET-->TF's as tolerated Constipation protocol as indicated  ENDO - will use ICU hypoglycemic\Hyperglycemia  protocol if indicated     ELECTROLYTES -follow labs as needed -replace as needed -pharmacy consultation and following   DVT/GI PRX ordered and assessed TRANSFUSIONS AS NEEDED MONITOR FSBS I Assessed the need for Labs I Assessed the need for Foley I Assessed the need for Central Venous Line Family Discussion when available I Assessed the need for Mobilization I made an Assessment of medications to be adjusted accordingly Safety Risk assessment completed   CASE DISCUSSED IN MULTIDISCIPLINARY ROUNDS WITH ICU TEAM  Critical Care Time devoted to patient care services described in this note is 45 minutes.   Overall, patient is critically ill, prognosis is guarded.  Patient with Multiorgan failure and at high risk for cardiac arrest and death.    Corrin Parker, M.D.  [facility]  Medical Director [place] Director [facility] Cardio-Pulmonary Department

## 2020-01-11 NOTE — Progress Notes (Signed)
Pt very agitated and combative at times. Pt trying to take mitts off and pull lines and tubes. Found pt with one mitt off and upon redirecting pt he drew back his fist to hit this RN. Successfully stopped pt and pt was given PRN versed with little effect. Restarted fentanyl gtt and gave bolus with some relief. Have had to titrate up fentanyl gtt to max 400 mcg and pt still restless trying to get mitts off and hanging feet off the bed. Will keep utilizing PRN fentanyl bolus and versed to achieve desired RASS.

## 2020-01-11 NOTE — Progress Notes (Signed)
Nutrition Follow-up  DOCUMENTATION CODES:   Obesity unspecified  INTERVENTION:  Initiate new goal TF regimen of Vital High Protein at 60 mL/hr (1440 ml goal daily volume) + PROSource TF 90 mL BID per tube. Provides 1600 kcal, 170 grams of protein, 1210 mL H2O daily.  New goal TF regimen meets 100% RDIs for vitamins/minerals so will discontinue MVI.  NUTRITION DIAGNOSIS:   Inadequate oral intake related to inability to eat as evidenced by NPO status.  Ongoing.  GOAL:   Patient will meet greater than or equal to 90% of their needs  Met with TF regimen.  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  11/27 intubated 11/29 extubated then later re-intubated 12/4 TFs held s/p emesis  Patient is currently intubated on ventilator support MV: 14.9 L/min Temp (24hrs), Avg:99.9 F (37.7 C), Min:99.1 F (37.3 C), Max:100.6 F (38.1 C)  Medications reviewed and include: Colace 100 mg BID, free water 200 mL Q4hrs, Novolog 0-20 units TID, Reglan 5 mg Q8hrs IV, MVI daily, Miralax, sennosides 5 mL BID, Precedex gtt, famotidine, fentanyl gtt, Versed gtt.  Labs reviewed: CBG 102-131, Sodium 146, BUN 47, Creatinine 1.54.  I/O: 3050 mL UOP yesterday (1.1 mL/kg/hr)  Weight trend: 118.7 kg on 12/6; -3.2 kg from 11/27  Enteral Access: OGT placed 11/29; terminates in proximal stomach per abdominal x-ray 11/29  TF regimen: Vital High Protein at 40 mL/hr + PROSource TF 90 mL QID  Discussed with RN and on rounds. Tube feeds resumed at some point (difficult to tell when from documentation) and patient  is tolerating well.  Diet Order:   Diet Order            Diet NPO time specified  Diet effective now                EDUCATION NEEDS:   No education needs have been identified at this time  Skin:  Skin Assessment: Reviewed RN Assessment  Last BM:  Unknown  Height:   Ht Readings from Last 1 Encounters:  01/10/20 6' 0.99" (1.854 m)   Weight:   Wt Readings from Last 1 Encounters:  01/11/20 118.7 kg   Ideal Body Weight:  83.6 kg  BMI:  Body mass index is 34.53 kg/m.  Estimated Nutritional Needs:   Kcal:  1341-1707 (11-14 kcal/kg)  Protein:  167 grams (2 grams/kg IBW)  Fluid:  2 L/day  Jacklynn Barnacle, MS, RD, LDN Pager number available on Amion

## 2020-01-11 NOTE — Progress Notes (Signed)
[place], [place] 01/11/20  Subjective:   Hospital day # 9 Pt seen at bedside.  Still on vent.  Currently sedated as well.   Renal: 12/05 0701 - 12/06 0700 In: 5127.7 [I.V.:1206.4; NG/GT:3821.3; IV Piggyback:100] Out: 3050 [Urine:3050] Lab Results  Component Value Date   CREATININE 1.54 (H) 01/11/2020   CREATININE 1.72 (H) 01/10/2020   CREATININE 2.03 (H) 01/09/2020     Objective:  Vital signs in last 24 hours:  Temp:  [99.5 F (37.5 C)-100.8 F (38.2 C)] 100.2 F (37.9 C) (12/06 0900) Pulse Rate:  [71-98] 83 (12/06 0900) Resp:  [18-29] 28 (12/06 0100) BP: (98-173)/(65-116) 98/65 (12/06 0900) SpO2:  [91 %-100 %] 91 % (12/06 0900) FiO2 (%):  [35 %] 35 % (12/06 0753) Weight:  [118.7 kg] 118.7 kg (12/06 0500)  Weight change: 0 kg Filed Weights   01/09/20 0500 01/10/20 0500 01/11/20 0500  Weight: 119.2 kg 118.7 kg 118.7 kg    Intake/Output:    Intake/Output Summary (Last 24 hours) at 01/11/2020 1016 Last data filed at 01/11/2020 7893 Gross per 24 hour  Intake 5058.1 ml  Output 2675 ml  Net 2383.1 ml     Physical Exam: General: Critically ill appearing  HEENT ETT in place  Pulm/lungs Vent assisted, scattered rhonchi  CVS/Heart S1S2 no rubs  Abdomen:  Soft nontender, nondistended  Extremities: 1+ lower extremity edema  Neurologic: On sedation  Skin: warm  GU: Foley in place with clear yellow urine       Basic Metabolic Panel:  Recent Labs  Lab 01/07/20 0621 01/07/20 0621 01/08/20 0451 01/08/20 0451 01/09/20 0433 01/10/20 0506 01/11/20 0550  NA 143  --  143  --  147* 144 146*  K 3.8  --  4.0  --  4.9 5.3* 4.8  CL 103  --  106  --  109 109 109  CO2 30  --  28  --  28 28 29   GLUCOSE 127*  --  108*  --  125* 130* 128*  BUN 74*  --  74*  --  72* 57* 47*  CREATININE 2.34*  --  2.07*  2.14*  --  2.03* 1.72* 1.54*  CALCIUM 8.1*   < > 8.1*   < > 8.6* 9.0 8.8*  MG 2.9*  --  2.7*  --  2.5* 2.5* 2.2  PHOS 5.0*  --  4.8*   --  4.4 3.8 3.7   < > = values in this interval not displayed.     CBC: Recent Labs  Lab 01/06/20 0544 01/07/20 0621 01/08/20 0451 01/09/20 0433 01/10/20 0506  WBC 6.2 5.2 4.3 5.7 6.9  NEUTROABS 3.8 2.8 2.1 3.7 5.0  HGB 13.1 13.4 12.5* 13.5 13.9  HCT 41.6 43.4 40.7 43.3 44.9  MCV 90.8 92.1 93.6 92.3 90.5  PLT 173 153 150 157 157      Lab Results  Component Value Date   HEPBSAG NON REACTIVE 01/03/2020   HEPBSAB NON REACTIVE 01/03/2020   HEPBIGM NON REACTIVE 01/03/2020      Microbiology:  Recent Results (from the past 240 hour(s))  Resp Panel by RT-PCR (Flu A&B, Covid) Nasopharyngeal Swab     Status: None   Collection Time: 01/01/20  3:13 PM   Specimen: Nasopharyngeal Swab; Nasopharyngeal(NP) swabs in vial transport medium  Result Value Ref Range Status   SARS Coronavirus 2 by RT PCR NEGATIVE NEGATIVE Final    Comment: (NOTE) SARS-CoV-2 target nucleic acids are NOT DETECTED.  The SARS-CoV-2  RNA is generally detectable in upper respiratory specimens during the acute phase of infection. The lowest concentration of SARS-CoV-2 viral copies this assay can detect is 138 copies/mL. A negative result does not preclude SARS-Cov-2 infection and should not be used as the sole basis for treatment or other patient management decisions. A negative result may occur with  improper specimen collection/handling, submission of specimen other than nasopharyngeal swab, presence of viral mutation(s) within the areas targeted by this assay, and inadequate number of viral copies(<138 copies/mL). A negative result must be combined with clinical observations, patient history, and epidemiological information. The expected result is Negative.  Fact Sheet for Patients:  EntrepreneurPulse.com.au  Fact Sheet for Healthcare Providers:  IncredibleEmployment.be  This test is no t yet approved or cleared by the [place] FDA and  has been authorized  for detection and/or diagnosis of SARS-CoV-2 by FDA under an Emergency Use Authorization (EUA). This EUA will remain  in effect (meaning this test can be used) for the duration of the COVID-19 declaration under Section 564(b)(1) of the Act, 21 U.S.C.section 360bbb-3(b)(1), unless the authorization is terminated  or revoked sooner.       Influenza A by PCR NEGATIVE NEGATIVE Final   Influenza B by PCR NEGATIVE NEGATIVE Final    Comment: (NOTE) The Xpert Xpress SARS-CoV-2/FLU/RSV plus assay is intended as an aid in the diagnosis of influenza from Nasopharyngeal swab specimens and should not be used as a sole basis for treatment. Nasal washings and aspirates are unacceptable for Xpert Xpress SARS-CoV-2/FLU/RSV testing.  Fact Sheet for Patients: EntrepreneurPulse.com.au  Fact Sheet for Healthcare Providers: IncredibleEmployment.be  This test is not yet approved or cleared by the [place] FDA and has been authorized for detection and/or diagnosis of SARS-CoV-2 by FDA under an Emergency Use Authorization (EUA). This EUA will remain in effect (meaning this test can be used) for the duration of the COVID-19 declaration under Section 564(b)(1) of the Act, 21 U.S.C. section 360bbb-3(b)(1), unless the authorization is terminated or revoked.  Performed at [facility], [address] [place], [place] [address]   MRSA PCR Screening     Status: None   Collection Time: 01/02/20  2:45 PM   Specimen: Nasopharyngeal  Result Value Ref Range Status   MRSA by PCR NEGATIVE NEGATIVE Final    Comment:        The GeneXpert MRSA Assay (FDA approved for NASAL specimens only), is one component of a comprehensive MRSA colonization surveillance program. It is not intended to diagnose MRSA infection nor to guide or monitor treatment for MRSA infections. Performed at [facility], [address] [place], [place] [address]   Culture,  respiratory     Status: None   Collection Time: 01/02/20  5:00 PM   Specimen: INDUCED SPUTUM  Result Value Ref Range Status   Specimen Description   Final    INDUCED SPUTUM Performed at [facility], [address] [place], [place] [address]    Special Requests   Final    NONE Performed at [facility], [address] [place], [place] [address]    Gram Stain   Final    NO WBC SEEN RARE GRAM POSITIVE COCCI IN PAIRS IN CHAINS    Culture   Final    FEW Normal respiratory flora-no Staph aureus or Pseudomonas seen Performed at [facility], [address] [place], [place] [address]    Report Status 01/05/2020 FINAL  Final  Culture, respiratory  Status: None   Collection Time: 01/04/20 11:14 AM   Specimen: Tracheal Aspirate; Respiratory  Result Value Ref Range Status   Specimen Description   Final    TRACHEAL ASPIRATE Performed at [facility], [address] [place], [place] [address]    Special Requests   Final    NONE Performed at [facility], [address] [place], [place] [address]    Gram Stain   Final    FEW WBC PRESENT, PREDOMINANTLY PMN NO ORGANISMS SEEN    Culture   Final    NO GROWTH 2 DAYS Performed at [facility] [address] [place], [place] [address]    Report Status 01/07/2020 FINAL  Final  Culture, respiratory (non-expectorated)     Status: None (Preliminary result)   Collection Time: 01/09/20  7:55 PM   Specimen: Tracheal Aspirate; Respiratory  Result Value Ref Range Status   Specimen Description   Final    TRACHEAL ASPIRATE Performed at [facility], [address] [place], [place] [address]    Special Requests   Final    NONE Performed at [facility], [address] [place], [place] [address]    Gram Stain   Final    RARE WBC PRESENT,BOTH PMN AND MONONUCLEAR RARE GRAM POSITIVE COCCI IN PAIRS IN CHAINS RARE GRAM NEGATIVE RODS Performed at [place], [place] [address], [place], [place] [address]    Culture PENDING  Incomplete   Report Status PENDING  Incomplete    Coagulation Studies: No results for input(s): LABPROT, INR in the last 72 hours.  Urinalysis: No results for input(s): COLORURINE, LABSPEC, PHURINE, GLUCOSEU, HGBUR, BILIRUBINUR, KETONESUR, PROTEINUR, UROBILINOGEN, NITRITE, LEUKOCYTESUR in the last 72 hours.  Invalid input(s): APPERANCEUR    Imaging: No results found.   Medications:   . sodium chloride    . sodium chloride 250 mL (01/10/20 1725)  . dexmedetomidine (PRECEDEX) IV infusion 1.2 mcg/kg/hr (01/11/20 0843)  . famotidine (PEPCID) IV 20 mg (01/10/20 2055)  . fentaNYL infusion INTRAVENOUS 400 mcg/hr (01/11/20 1004)  . midazolam 2 mg/hr (01/11/20 0843)   . acetylcysteine  4 mL Nebulization BID  . apixaban  5 mg Per Tube BID  . budesonide (PULMICORT) nebulizer solution  0.5 mg Nebulization BID  . chlorhexidine gluconate (MEDLINE KIT)  15 mL Mouth Rinse BID  . Chlorhexidine Gluconate Cloth  6 each Topical Q0600  . docusate  100 mg Per Tube BID  . feeding supplement (PROSource TF)  90 mL Per Tube QID  . feeding supplement (VITAL HIGH PROTEIN)  1,000 mL Per Tube Q24H  . free water  200 mL Per Tube Q4H  . insulin aspart  0-20 Units Subcutaneous TID WC  . ipratropium-albuterol  3 mL Nebulization Q6H  . mouth rinse  15 mL Mouth Rinse 10 times per day  . metoCLOPramide (REGLAN) injection  5 mg Intravenous Q8H  . multivitamin with minerals  1 tablet Per Tube Daily  . polyethylene glycol  17 g Per Tube Daily   acetaminophen **OR** acetaminophen, albuterol, fentaNYL, metoprolol tartrate, midazolam, ondansetron **OR** ondansetron (ZOFRAN) IV  Assessment/ Plan:  49 y.o. male with     admitted on 01/01/2020 for Shortness of breath [R06.02] Hypercapnia [R06.89] COPD exacerbation (HCC) [J44.1] Acute respiratory failure with hypoxia and hypercapnia (HCC) [J96.01, J96.02] Acute and chr resp failure, unsp w hypoxia  or hypercapnia ([place]) [J96.20]   #. Acute kidney injury on chronic kidney disease stage IIIA with proteinuria: baseline creatinine  of 1.41, GFR of 58 on 08/01/19.  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  Acute kidney injury secondary to ATN from hypotension and hypoxia Nonoliguric urine output. No IV contrast exposure.  Lab Results  Component Value Date   CREATININE 1.54 (H) 01/11/2020   CREATININE 1.72 (H) 01/10/2020   CREATININE 2.03 (H) 01/09/2020   - Renal ultrasound- unremarkable , no obstruction  12/05 0701 - 12/06 0700 In: 5127.7 [I.V.:1206.4; NG/GT:3821.3; IV Piggyback:100] Out: 3050 [Urine:3050]   -Renal function continues to improve.  Creatinine down to 1.54.  Good urine output noted.  #.   Acute respiratory failure: requiring intubation and mechanical ventilation.  Acute exacerbation of COPD and sarcoidosis. 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  -Patient still maintained on the ventilator.  Weaning as per protocol.  #. Diabetes mellitus type II with chronic kidney disease: insulin dependent.  Lab Results  Component Value Date   HGBA1C 6.5 (H) 01/02/2020  UPC 0.47    LOS: 9 Tykira Wachs 12/6/202110:16 AM  [place], [place]  Note: This note was prepared with Dragon dictation. Any transcription errors are unintentional

## 2020-01-11 NOTE — Consult Note (Signed)
PHARMACY CONSULT NOTE  Pharmacy Consult for Electrolyte Monitoring and Replacement   Recent Labs: Potassium (mmol/L)  Date Value  01/11/2020 4.8  05/28/2014 3.9   Magnesium (mg/dL)  Date Value  01/11/2020 2.2  05/14/2013 1.8   Calcium (mg/dL)  Date Value  01/11/2020 8.8 (L)   Calcium, Total (mg/dL)  Date Value  05/28/2014 8.4 (L)   Albumin (g/dL)  Date Value  01/08/2020 2.6 (L)  06/01/2013 3.8   Phosphorus (mg/dL)  Date Value  01/11/2020 3.7  03/09/2013 3.0   Sodium (mmol/L)  Date Value  01/11/2020 146 (H)  05/28/2014 140   Corrected Ca: 8.3 mg/dL  Assessment: 49 year old male with PMHx of HTN, DM, COPD, CAD, hx CVA, sarcoidosis, CHF, afib on Eliquis, CKD 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  12/6: Scr continues to improve, nearing baseline. Good UOP.   Goal of Therapy:  Potassium 4.0 - 5.1 mmol/L Magnesium 2.0 - 2.4 mg/dL Electrolytes WNL  Plan:   Hyperkalemia resolved. Potassium remains at ULN to 4.8 today  Sodium 146. Patient is receiving free water 200 mL q4h. Will continue to monitor.   Reassess tomorrow with AM labs  Benita Gutter 01/11/2020 7:47 AM

## 2020-01-12 ENCOUNTER — Inpatient Hospital Stay: Payer: Medicare HMO

## 2020-01-12 DIAGNOSIS — J9620 Acute and chronic respiratory failure, unspecified whether with hypoxia or hypercapnia: Secondary | ICD-10-CM | POA: Diagnosis not present

## 2020-01-12 DIAGNOSIS — I5042 Chronic combined systolic (congestive) and diastolic (congestive) heart failure: Secondary | ICD-10-CM | POA: Diagnosis not present

## 2020-01-12 DIAGNOSIS — J441 Chronic obstructive pulmonary disease with (acute) exacerbation: Secondary | ICD-10-CM | POA: Diagnosis not present

## 2020-01-12 DIAGNOSIS — J9601 Acute respiratory failure with hypoxia: Secondary | ICD-10-CM | POA: Diagnosis not present

## 2020-01-12 LAB — BASIC METABOLIC PANEL
Anion gap: 5 (ref 5–15)
BUN: 55 mg/dL — ABNORMAL HIGH (ref 6–20)
CO2: 29 mmol/L (ref 22–32)
Calcium: 8.5 mg/dL — ABNORMAL LOW (ref 8.9–10.3)
Chloride: 109 mmol/L (ref 98–111)
Creatinine, Ser: 1.58 mg/dL — ABNORMAL HIGH (ref 0.61–1.24)
GFR, Estimated: 53 mL/min — ABNORMAL LOW (ref >60)
Glucose, Bld: 158 mg/dL — ABNORMAL HIGH (ref 70–99)
Potassium: 4.6 mmol/L (ref 3.5–5.1)
Sodium: 143 mmol/L (ref 135–145)

## 2020-01-12 LAB — GLUCOSE, CAPILLARY
Glucose-Capillary: 137 mg/dL — ABNORMAL HIGH (ref 70–99)
Glucose-Capillary: 153 mg/dL — ABNORMAL HIGH (ref 70–99)
Glucose-Capillary: 112 mg/dL — ABNORMAL HIGH (ref 70–99)
Glucose-Capillary: 130 mg/dL — ABNORMAL HIGH (ref 70–99)
Glucose-Capillary: 157 mg/dL — ABNORMAL HIGH (ref 70–99)

## 2020-01-12 LAB — PROTIME-INR
INR: 1.3 — ABNORMAL HIGH (ref 0.8–1.2)
Prothrombin Time: 15.3 seconds — ABNORMAL HIGH (ref 11.4–15.2)

## 2020-01-12 LAB — CULTURE, RESPIRATORY W GRAM STAIN: Culture: NORMAL

## 2020-01-12 LAB — HEPARIN LEVEL (UNFRACTIONATED): Heparin Unfractionated: 1.64 IU/mL — ABNORMAL HIGH (ref 0.30–0.70)

## 2020-01-12 LAB — CBC
HCT: 39.4 % (ref 39.0–52.0)
Hemoglobin: 12 g/dL — ABNORMAL LOW (ref 13.0–17.0)
MCH: 28.5 pg (ref 26.0–34.0)
MCHC: 30.5 g/dL (ref 30.0–36.0)
MCV: 93.6 fL (ref 80.0–100.0)
Platelets: 144 10*3/uL — ABNORMAL LOW (ref 150–400)
RBC: 4.21 MIL/uL — ABNORMAL LOW (ref 4.22–5.81)
RDW: 13.2 % (ref 11.5–15.5)
WBC: 8.2 10*3/uL (ref 4.0–10.5)
nRBC: 0 % (ref 0.0–0.2)

## 2020-01-12 LAB — APTT: aPTT: 35 seconds (ref 24–36)

## 2020-01-12 LAB — MAGNESIUM: Magnesium: 2.2 mg/dL (ref 1.7–2.4)

## 2020-01-12 LAB — PHOSPHORUS: Phosphorus: 5.2 mg/dL — ABNORMAL HIGH (ref 2.5–4.6)

## 2020-01-12 MED ORDER — HEPARIN (PORCINE) 25000 UT/250ML-% IV SOLN
1700.0000 [IU]/h | INTRAVENOUS | Status: DC
  Administered 2020-01-12: 1450 [IU]/h via INTRAVENOUS
  Administered 2020-01-13: 1700 [IU]/h via INTRAVENOUS
  Filled 2020-01-12 (×2): qty 250

## 2020-01-12 MED ORDER — MIDODRINE HCL 5 MG PO TABS
10.0000 mg | ORAL_TABLET | Freq: Two times a day (BID) | ORAL | Status: DC
  Administered 2020-01-12 – 2020-01-17 (×9): 10 mg
  Filled 2020-01-12 (×11): qty 2

## 2020-01-12 NOTE — Consult Note (Signed)
[facility] for Heparin Infusion Indication: atrial fibrillation  Patient Measurements: Height: 6' 0.99" (185.4 cm) Weight: 120 kg (264 lb 8.8 oz) IBW/kg (Calculated) : 79.88 Heparin Dosing Weight: 104 kg  Labs: Recent Labs    01/10/20 0506 01/11/20 0550 01/12/20 0615  HGB 13.9  --  12.0*  HCT 44.9  --  39.4  PLT 157  --  144*  CREATININE 1.72* 1.54* 1.58*    Estimated Creatinine Clearance: 76.7 mL/min (A) (by C-G formula based on SCr of 1.58 mg/dL (H)).   Medical History: Past Medical History:  Diagnosis Date  . CHF (congestive heart failure) (HCC)    EF 45-50%- 2020  . CKD (chronic kidney disease) stage 3, GFR 30-59 ml/min (HCC)   . Clear cell renal cell carcinoma (HCC)    s/p partial nephrectomy  . COPD (chronic obstructive pulmonary disease) ([place])   . Coronary artery disease   . Diabetes mellitus without complication ([place])   . Hypercholesteremia unk  . Hypertension   . Myocardial infarction ([place])   . Sarcoidosis   . Sleep apnea    does not use CPAP regularly.  . Stroke [facility])     Medications:  Apixaban 5 mg BID (last dose 12/7 at 0820)  Assessment: Patient is a 49 y/o M with medical history as above and including atrial fibrillation on apixaban who is admitted with respiratory failure requiring intubation and mechanical ventilation. Plan is for tracheostomy and apixaban has been put on hold pending procedure. Pharmacy has been consulted to initiate heparin infusion peri-operatively.   CBC today notable for Hgb 12 and platelets 144. Baseline aPTT, PT-INR, and anti-Xa level ordered and pending.   Goal of Therapy:  aPTT 66 - 102 seconds Monitor platelets by anticoagulation protocol: Yes   Plan:  --Start heparin infusion at 1450 units/hr with no bolus on 12/7 at 2000 (~12 hours after last dose of apixaban) --aPTT 6 hours after initiation of heparin infusion. Will follow aPTT for now given suspected interference of apixaban  on anti-Xa level. Once correlation with aPTT and anti-Xa levels is established can switch to anti-Xa level monitoring --Daily CBC per protocol while on heparin infusion  Benita Gutter 01/12/2020,11:25 AM

## 2020-01-12 NOTE — Progress Notes (Signed)
[place], [place] 01/12/20  Subjective:   Hospital day # 10 Patient remains ventilator dependent at the moment. Percussion feature on hospital bed currently on. Urine output 1.8 L over the preceding 24 hours.  Renal: 12/06 0701 - 12/07 0700 In: 3138.7 [I.V.:1810.7; NG/GT:1228; IV Piggyback:100] Out: 1825 [Urine:1825] Lab Results  Component Value Date   CREATININE 1.58 (H) 01/12/2020   CREATININE 1.54 (H) 01/11/2020   CREATININE 1.72 (H) 01/10/2020     Objective:  Vital signs in last 24 hours:  Temp:  [98.1 F (36.7 C)-100.2 F (37.9 C)] 98.2 F (36.8 C) (12/07 0600) Pulse Rate:  [71-91] 71 (12/07 0600) Resp:  [20] 20 (12/07 0600) BP: (83-110)/(53-73) 98/64 (12/07 0600) SpO2:  [91 %-97 %] 96 % (12/07 0756) FiO2 (%):  [30 %-35 %] 30 % (12/07 0756) Weight:  [120 kg] 120 kg (12/07 0500)  Weight change: 1.3 kg Filed Weights   01/10/20 0500 01/11/20 0500 01/12/20 0500  Weight: 118.7 kg 118.7 kg 120 kg    Intake/Output:    Intake/Output Summary (Last 24 hours) at 01/12/2020 0826 Last data filed at 01/12/2020 0600 Gross per 24 hour  Intake 3138.69 ml  Output 1825 ml  Net 1313.69 ml     Physical Exam: General: Critically ill appearing  HEENT ETT in place  Pulm/lungs Vent assisted, scattered rhonchi  CVS/Heart S1S2 no rubs  Abdomen:  Soft nontender, nondistended  Extremities: 1+ lower extremity edema  Neurologic: On sedation  Skin: warm  GU: Foley in place with clear yellow urine       Basic Metabolic Panel:  Recent Labs  Lab 01/08/20 0451 01/08/20 0451 01/09/20 0433 01/09/20 0433 01/10/20 0506 01/11/20 0550 01/12/20 0615  NA 143  --  147*  --  144 146* 143  K 4.0  --  4.9  --  5.3* 4.8 4.6  CL 106  --  109  --  109 109 109  CO2 28  --  28  --  _0 GLUCOSE 108*  --  125*  --  130* 128* 158*  BUN 74*  --  72*  --  57* 47* 55*  CREATININE 2.07*  2.14*  --  2.03*  --  1.72* 1.54* 1.58*  CALCIUM 8.1*   < > 8.6*    < > 9.0 8.8* 8.5*  MG 2.7*  --  2.5*  --  2.5* 2.2 2.2  PHOS 4.8*  --  4.4  --  3.8 3.7 5.2*   < > = values in this interval not displayed.     CBC: Recent Labs  Lab 01/06/20 0544 01/06/20 0544 01/07/20 0621 01/08/20 0451 01/09/20 0433 01/10/20 0506 01/12/20 0615  WBC 6.2   < > 5.2 4.3 5.7 6.9 8.2  NEUTROABS 3.8  --  2.8 2.1 3.7 5.0  --   HGB 13.1   < > 13.4 12.5* 13.5 13.9 12.0*  HCT 41.6   < > 43.4 40.7 43.3 44.9 39.4  MCV 90.8   < > 92.1 93.6 92.3 90.5 93.6  PLT 173   < > 153 150 157 157 144*   < > = values in this interval not displayed.      Lab Results  Component Value Date   HEPBSAG NON REACTIVE 01/03/2020   HEPBSAB NON REACTIVE 01/03/2020   HEPBIGM NON REACTIVE 01/03/2020      Microbiology:  Recent Results (from the past 240 hour(s))  MRSA PCR Screening     Status: None  Collection Time: 01/02/20  2:45 PM   Specimen: Nasopharyngeal  Result Value Ref Range Status   MRSA by PCR NEGATIVE NEGATIVE Final    Comment:        The GeneXpert MRSA Assay (FDA approved for NASAL specimens only), is one component of a comprehensive MRSA colonization surveillance program. It is not intended to diagnose MRSA infection nor to guide or monitor treatment for MRSA infections. Performed at [facility], [address] [place], [place] [address]   Culture, respiratory     Status: None   Collection Time: 01/02/20  5:00 PM   Specimen: INDUCED SPUTUM  Result Value Ref Range Status   Specimen Description   Final    INDUCED SPUTUM Performed at [facility], [address], [place], [place] [address]    Special Requests   Final    NONE Performed at [facility], [address] [place], [place] [address]    Gram Stain   Final    NO WBC SEEN RARE GRAM POSITIVE COCCI IN PAIRS IN CHAINS    Culture   Final    FEW Normal respiratory flora-no Staph aureus or Pseudomonas seen Performed at [facility], [address]  [place], [place] [address]    Report Status 01/05/2020 FINAL  Final  Culture, respiratory     Status: None   Collection Time: 01/04/20 11:14 AM   Specimen: Tracheal Aspirate; Respiratory  Result Value Ref Range Status   Specimen Description   Final    TRACHEAL ASPIRATE Performed at [facility], [address] [place], [place] [address]    Special Requests   Final    NONE Performed at [facility], [address] [place], [place] [address]    Gram Stain   Final    FEW WBC PRESENT, PREDOMINANTLY PMN NO ORGANISMS SEEN    Culture   Final    NO GROWTH 2 DAYS Performed at [facility] [address], [place], [place] [address]    Report Status 01/07/2020 FINAL  Final  Culture, respiratory (non-expectorated)     Status: None (Preliminary result)   Collection Time: 01/09/20  7:55 PM   Specimen: Tracheal Aspirate; Respiratory  Result Value Ref Range Status   Specimen Description   Final    TRACHEAL ASPIRATE Performed at [facility], [address], [place], [place] [address]    Special Requests   Final    NONE Performed at [facility], [address] [place], [place] [address]    Gram Stain   Final    RARE WBC PRESENT,BOTH PMN AND MONONUCLEAR RARE GRAM POSITIVE COCCI IN PAIRS IN CHAINS RARE GRAM NEGATIVE RODS    Culture   Final    CULTURE REINCUBATED FOR BETTER GROWTH Performed at [facility], [place] [address] [place], [place] [address]    Report Status PENDING  Incomplete    Coagulation Studies: No results for input(s): LABPROT, INR in the last 72 hours.  Urinalysis: No results for input(s): COLORURINE, LABSPEC, PHURINE, GLUCOSEU, HGBUR, BILIRUBINUR, KETONESUR, PROTEINUR, UROBILINOGEN, NITRITE, LEUKOCYTESUR in the last 72 hours.  Invalid input(s): APPERANCEUR    Imaging: DG Chest Port 1 View  Result Date: 01/12/2020 CLINICAL DATA:  Intubation EXAM: PORTABLE CHEST 1 VIEW COMPARISON:  Three days ago FINDINGS: Low  volume chest with bands of opacity on both sides correlating with history of sarcoid. Endotracheal tube is in good position in the enteric tube at least reaches  the stomach. Normal heart size for technique. IMPRESSION: 1. Stable hardware positioning. 2. Low volume chest with bilateral scarring. Aeration is mildly improved from prior. Electronically Signed   By: Monte Fantasia M.D.   On: 01/12/2020 05:08     Medications:   . sodium chloride    . sodium chloride 10 mL/hr at 01/11/20 2000  . sodium chloride 10 mL/hr at 01/12/20 0600  . dexmedetomidine (PRECEDEX) IV infusion 0.8 mcg/kg/hr (01/12/20 0600)  . famotidine (PEPCID) IV Stopped (01/11/20 2216)  . feeding supplement (VITAL HIGH PROTEIN) 60 mL/hr at 01/12/20 0600  . fentaNYL infusion INTRAVENOUS 325 mcg/hr (01/12/20 0600)  . midazolam 4 mg/hr (01/12/20 0600)  . norepinephrine (LEVOPHED) Adult infusion Stopped (01/12/20 0042)   . acetylcysteine  4 mL Nebulization BID  . apixaban  5 mg Per Tube BID  . budesonide (PULMICORT) nebulizer solution  0.5 mg Nebulization BID  . chlorhexidine gluconate (MEDLINE KIT)  15 mL Mouth Rinse BID  . Chlorhexidine Gluconate Cloth  6 each Topical Q0600  . docusate  100 mg Per Tube BID  . feeding supplement (PROSource TF)  90 mL Per Tube BID  . free water  200 mL Per Tube Q4H  . insulin aspart  0-20 Units Subcutaneous TID WC  . ipratropium-albuterol  3 mL Nebulization Q6H  . mouth rinse  15 mL Mouth Rinse 10 times per day  . metoCLOPramide (REGLAN) injection  5 mg Intravenous Q8H  . midodrine  10 mg Oral BID WC  . multivitamin with minerals  1 tablet Per Tube Daily  . polyethylene glycol  17 g Per Tube Daily  . sennosides  5 mL Per Tube BID   acetaminophen **OR** acetaminophen, albuterol, fentaNYL, metoprolol tartrate, midazolam, ondansetron **OR** ondansetron (ZOFRAN) IV  Assessment/ Plan:  49 y.o. male with     admitted on 01/01/2020 for Shortness of breath [R06.02] Hypercapnia [R06.89] COPD  exacerbation (HCC) [J44.1] Acute respiratory failure with hypoxia and hypercapnia (HCC) [J96.01, J96.02] Acute and chr resp failure, unsp w hypoxia or hypercapnia ([place]) [J96.20]   #. Acute kidney injury on chronic kidney disease stage IIIA with proteinuria: baseline creatinine of 1.41, GFR of 58 on 08/01/19.  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  Acute kidney injury secondary to ATN from hypotension and hypoxia Nonoliguric urine output. No IV contrast exposure.  Lab Results  Component Value Date   CREATININE 1.58 (H) 01/12/2020   CREATININE 1.54 (H) 01/11/2020   CREATININE 1.72 (H) 01/10/2020   - Renal ultrasound- unremarkable , no obstruction  12/06 0701 - 12/07 0700 In: 3138.7 [I.V.:1810.7; NG/GT:1228; IV Piggyback:100] Out: 3664 [Urine:1825]   -Renal function about the same as creatinine currently 1.5.  Good urine output of 1.8 L over the preceding 24 hours.  No need for dialysis at the moment.  Continue to monitor renal parameters.  #.   Acute respiratory failure: requiring intubation and mechanical ventilation.  Acute exacerbation of COPD and sarcoidosis. 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  -Currently percussion feature is activated on his hospital bed.  Weaning as per pulmonary/reculture.  #. Diabetes mellitus type II with chronic kidney disease: insulin dependent.  Lab Results  Component Value Date   HGBA1C 6.5 (H) 01/02/2020  UPC 0.47, consider ARB once patient has recovery of renal function.    LOS: 10 Munsoor Lateef 12/7/20218:26 [place], [place]  Note: This note was prepared with Dragon dictation. Any transcription errors are unintentional

## 2020-01-12 NOTE — Consult Note (Addendum)
[name], [place] [id number] Jul 02, 1970 Corey Nearing, MD  Reason for Consult: Respiratory failure Requesting Physician: Flora Lipps, MD Consulting Physician: Corey Nearing, MD  HPI: This 49 y.o. year old male was admitted on 01/01/2020 for Shortness of breath [R06.02] Hypercapnia [R06.89] COPD exacerbation ([place]) [J44.1] Acute respiratory failure with hypoxia and hypercapnia ([place]) [J96.01, J96.02] Acute and chr resp failure, unsp w hypoxia or hypercapnia ([place]) [J96.20]. HIs medical history significant for chronic combined systolic and diastolic CHF, COPD, CAD, history of MI, type 2 diabetes, hyperlipidemia, hypertension, sarcoidosis, sleep apnea not using CPAP regularly, history of other nonhemorrhagic CVA. PREVIOUS history of Trach and LTACH ADMISSION for respiratory failure and recurrent admission for resp failure. Was admitted 11/26, intubated on admission, with COVID negative acute on chronic respiratory failure, renal failure, and severe metabolic encephalopathy. He failed extubation attempt 11/29, and has since failed attempts to wean from the vent.   Medications:  Current Facility-Administered Medications  Medication Dose Route Frequency Provider Last Rate Last Admin  . 0.9 %  sodium chloride infusion  250 mL Intravenous Continuous Kasa, Kurian, MD      . 0.9 %  sodium chloride infusion  250 mL Intravenous Continuous Ottie Glazier, MD 10 mL/hr at 01/11/20 2000 Rate Verify at 01/11/20 2000  . 0.9 %  sodium chloride infusion  250 mL Intravenous Continuous Darel Hong D, NP 10 mL/hr at 01/12/20 0600 Rate Verify at 01/12/20 0600  . acetaminophen (TYLENOL) tablet 650 mg  650 mg Per Tube Q6H PRN Flora Lipps, MD       Or  . acetaminophen (TYLENOL) suppository 650 mg  650 mg Rectal Q6H PRN Flora Lipps, MD      . acetylcysteine (MUCOMYST) 20 % nebulizer / oral solution 4 mL  4 mL Nebulization BID Ottie Glazier, MD   4 mL at 01/12/20 0754  . albuterol (PROVENTIL) (2.5 MG/3ML) 0.083%  nebulizer solution 2.5 mg  2.5 mg Nebulization Q4H PRN Ottie Glazier, MD   2.5 mg at 01/07/20 0807  . apixaban (ELIQUIS) tablet 5 mg  5 mg Per Tube BID Ottie Glazier, MD   5 mg at 01/12/20 0820  . budesonide (PULMICORT) nebulizer solution 0.5 mg  0.5 mg Nebulization BID Flora Lipps, MD   0.5 mg at 01/12/20 0754  . chlorhexidine gluconate (MEDLINE KIT) (PERIDEX) 0.12 % solution 15 mL  15 mL Mouth Rinse BID Flora Lipps, MD   15 mL at 01/12/20 0815  . Chlorhexidine Gluconate Cloth 2 % PADS 6 each  6 each Topical [id number] Flora Lipps, MD   6 each at 01/12/20 0537  . dexmedetomidine (PRECEDEX) 400 MCG/100ML (4 mcg/mL) infusion  0.4-1.2 mcg/kg/hr Intravenous Titrated Tyler Pita, MD 23.8 mL/hr at 01/12/20 0849 0.8 mcg/kg/hr at 01/12/20 0849  . docusate (COLACE) 50 MG/5ML liquid 100 mg  100 mg Per Tube BID Flora Lipps, MD   100 mg at 01/12/20 0830  . famotidine (PEPCID) IVPB 20 mg premix  20 mg Intravenous Q12H Dallie Piles, RPH 100 mL/hr at 01/12/20 0834 20 mg at 01/12/20 0834  . feeding supplement (PROSource TF) liquid 90 mL  90 mL Per Tube BID Flora Lipps, MD   90 mL at 01/12/20 0821  . feeding supplement (VITAL HIGH PROTEIN) liquid 1,000 mL  1,000 mL Per Tube Continuous Flora Lipps, MD 60 mL/hr at 01/12/20 0839 1,000 mL at 01/12/20 0839  . fentaNYL (SUBLIMAZE) bolus via infusion 50 mcg  50 mcg Intravenous Q15 min PRN Flora Lipps, MD   50 mcg at  01/11/20 0526  . fentaNYL 254mg in NS 2539m(1045mml) infusion-PREMIX  0-400 mcg/hr Intravenous Continuous KasFlora LippsD 32.5 mL/hr at 01/12/20 0833 325 mcg/hr at 01/12/20 0833  . free water 200 mL  200 mL Per Tube Q4H GonTyler PitaD   200 mL at 01/12/20 0819  . insulin aspart (novoLOG) injection 0-20 Units  0-20 Units Subcutaneous TID WC OrtReubin MilanD   4 Units at 01/12/20 [id number] ipratropium-albuterol (DUONEB) 0.5-2.5 (3) MG/3ML nebulizer solution 3 mL  3 mL Nebulization Q6H GonTyler PitaD   3 mL at 01/12/20 0754   . MEDLINE mouth rinse  15 mL Mouth Rinse 10 times per day KasFlora LippsD   15 mL at 01/12/20 0600  . metoCLOPramide (REGLAN) injection 5 mg  5 mg Intravenous Q8H GonTyler PitaD   5 mg at 01/12/20 [id number] metoprolol tartrate (LOPRESSOR) injection 5 mg  5 mg Intravenous Q6H PRN AleOttie GlazierD   5 mg at 01/04/20 1035  . midazolam (VERSED) 50 mg/50 mL (1 mg/mL) premix infusion  0.5-10 mg/hr Intravenous Continuous KasFlora LippsD 4 mL/hr at 01/12/20 0801 4 mg/hr at 01/12/20 0801  . midazolam (VERSED) injection 2 mg  2 mg Intravenous Q2H PRN KeeBradly BienenstockP   2 mg at 01/11/20 [id number] midodrine (PROAMATINE) tablet 10 mg  10 mg Per Tube BID WC KasFlora LippsD      . multivitamin with minerals tablet 1 tablet  1 tablet Per Tube Daily AleOttie GlazierD   1 tablet at 01/12/20 0820  . norepinephrine (LEVOPHED) 4mg48m 250mL73mmix infusion  2-10 mcg/min Intravenous Titrated KeeneBradly Bienenstock  Stopped at 01/12/20 0042 [phone] (ZOFRAN) tablet 4 mg  4 mg Per Tube Q6H PRN Kasa,Flora Lipps      Or  . ondansetron (ZOFRAN) injection 4 mg  4 mg Intravenous Q6H PRN Kasa,Flora Lipps     . polyethylene glycol (MIRALAX / GLYCOLAX) packet 17 g  17 g Per Tube Daily Kasa,Flora Lipps  17 g at 01/12/20 0821  . sennosides (SENOKOT) 8.8 MG/5ML syrup 5 mL  5 mL Per Tube BID Kasa,Flora Lipps  5 mL at 01/11/20 2300  .  Medications Prior to Admission  Medication Sig Dispense Refill  . albuterol (VENTOLIN HFA) 108 (90 Base) MCG/ACT inhaler Inhale 1-2 puffs into the lungs every 4 (four) hours as needed for wheezing or shortness of breath.    . apiMarland Kitchenaban (ELIQUIS) 5 MG TABS tablet Take 5 mg by mouth 2 (two) times daily.    . aspMarland Kitchenrin 81 MG EC tablet Take 81 mg by mouth at bedtime.     . atoMarland Kitchenvastatin (LIPITOR) 40 MG tablet Take 40 mg by mouth at bedtime.     . Fluticasone-Salmeterol (ADVAIR) 250-50 MCG/DOSE AEPB Inhale 1 puff into the lungs 2 (two) times daily.    . furosemide (LASIX) 80 MG  tablet Take 80 mg by mouth 2 (two) times daily.    . insulin aspart (NOVOLOG) 100 UNIT/ML injection Inject 5-15 Units into the skin 3 (three) times daily before meals.     . insulin detemir (LEVEMIR) 100 UNIT/ML injection Inject 40-50 Units into the skin at bedtime.     . iprMarland Kitchentropium-albuterol (DUONEB) 0.5-2.5 (3) MG/3ML SOLN Inhale 3 mLs into the lungs 4 (four) times daily as needed for wheezing.    . isosorbide dinitrate (ISORDIL) 10 MG tablet  Take 1 tablet (10 mg total) by mouth 3 (three) times daily. 270 tablet 3  . lisinopril (ZESTRIL) 30 MG tablet Take 30 mg by mouth at bedtime.     . Melatonin 5 MG TABS Take 5 mg by mouth at bedtime as needed for sleep.    . metoprolol tartrate (LOPRESSOR) 100 MG tablet Take 1 tablet (100 mg total) by mouth 2 (two) times daily. (Patient taking differently: Take 100 mg by mouth at bedtime. ) 60 tablet 2  . potassium chloride SA (KLOR-CON) 20 MEQ tablet Take 20 mEq by mouth at bedtime.     . theophylline (THEODUR) 300 MG 12 hr tablet Take 300 mg by mouth 2 (two) times daily.     Marland Kitchen tiotropium (SPIRIVA) 18 MCG inhalation capsule Place 18 mcg into inhaler and inhale daily.      Allergies:  Allergies  Allergen Reactions  . Penicillins Anaphylaxis and Other (See Comments)    Has patient had a PCN reaction causing immediate rash, facial/tongue/throat swelling, SOB or lightheadedness with hypotension: Yes Has patient had a PCN reaction causing severe rash involving mucus membranes or skin necrosis: No Has patient had a PCN reaction that required hospitalization: No Has patient had a PCN reaction occurring within the last 10 years: No If all of the above answers are "NO", then may proceed with Cephalosporin use.     PMH:  Past Medical History:  Diagnosis Date  . CHF (congestive heart failure) (HCC)    EF 45-50%- 2020  . CKD (chronic kidney disease) stage 3, GFR 30-59 ml/min (HCC)   . Clear cell renal cell carcinoma (HCC)    s/p partial nephrectomy  .  COPD (chronic obstructive pulmonary disease) ([place])   . Coronary artery disease   . Diabetes mellitus without complication ([place])   . Hypercholesteremia unk  . Hypertension   . Myocardial infarction ([place])   . Sarcoidosis   . Sleep apnea    does not use CPAP regularly.  . Stroke ([place])     Fam Hx:  Family History  Problem Relation Age of Onset  . Hypertension Mother   . Heart disease Mother   . Diabetes Mother   . Cancer Father     Soc Hx:  Social History   Socioeconomic History  . Marital status: Married    Spouse name: Not on file  . Number of children: Not on file  . Years of education: Not on file  . Highest education level: Not on file  Occupational History  . Occupation: disabled  Tobacco Use  . Smoking status: Current Every Day Smoker    Packs/day: 0.25    Years: 25.00    Pack years: 6.25    Types: Cigarettes    Last attempt to quit: 02/05/2015    Years since quitting: 4.9  . Smokeless tobacco: Never Used  Vaping Use  . Vaping Use: Never used  Substance and Sexual Activity  . Alcohol use: Not Currently    Alcohol/week: 0.0 standard drinks    Comment: occassional  . Drug use: Yes    Types: Marijuana, PCP    Comment: 4 weeks  . Sexual activity: Yes  Other Topics Concern  . Not on file  Social History Narrative  . Not on file   Social Determinants of Health   Financial Resource Strain:   . Difficulty of Paying Living Expenses: Not on file  Food Insecurity:   . Worried About Charity fundraiser in the Last Year: Not on  file  . [place] in the Last Year: Not on file  Transportation Needs:   . Lack of Transportation (Medical): Not on file  . Lack of Transportation (Non-Medical): Not on file  Physical Activity:   . Days of Exercise per Week: Not on file  . Minutes of Exercise per Session: Not on file  Stress:   . Feeling of Stress : Not on file  Social Connections:   . Frequency of Communication with Friends and Family: Not on file  .  Frequency of Social Gatherings with Friends and Family: Not on file  . Attends Religious Services: Not on file  . Active Member of Clubs or Organizations: Not on file  . Attends Archivist Meetings: Not on file  . Marital Status: Not on file  Intimate Partner Violence:   . Fear of Current or Ex-Partner: Not on file  . Emotionally Abused: Not on file  . Physically Abused: Not on file  . Sexually Abused: Not on file    PSH:  Past Surgical History:  Procedure Laterality Date  . APPENDECTOMY    . PARTIAL NEPHRECTOMY    . Procedures since admission: No admission procedures for hospital encounter.  ROS: unable to assess. Intubated and sedated.  PHYSICAL EXAM  Vitals: Blood pressure 98/64, pulse 71, temperature 98.2 F (36.8 C), temperature source Bladder, resp. rate 20, height 6' 0.99" (1.854 m), weight 120 kg, SpO2 96 %.. General: Well-developed, Well-nourished on vent and sedated Mood: Unable to assess. Patient on vent and sedated Orientation: Patient on vent and sedated. Vocal Quality: Unable to assess. Patient intubated, on vent and sedated Head and Face: NCAT. No facial asymmetry. No visible skin lesions. No significant facial scars. No tenderness with sinus percussion. Facial strength normal and symmetric. Ears: External ears with normal landmarks, no lesions. External auditory canals free of infection, cerumen impaction or lesions. Tympanic membranes intact with good landmarks and normal mobility on pneumatic otoscopy. No middle ear effusion. Hearing: Unable to assess. Patient on vent and sedated Nose: External nose normal with midline dorsum and no lesions or deformity. Nasal Cavity reveals essentially midline septum with normal inferior turbinates. No significant mucosal congestion or erythema. Nasal secretions are minimal and clear. No polyps seen on anterior rhinoscopy. Oral Cavity/ Oropharynx: Lips are normal with no lesions. Teeth no frank dental caries visualized and  gingiva appear normal. Visualized tongue, floor of mouth and pharynx appear normal, though exam is limited due to presence of endotracheal tube. Indirect Laryngoscopy/Nasopharyngoscopy: Visualization of the larynx, hypopharynx and nasopharynx is not possible in this setting with routine examination. Neck: Supple and symmetric with no palpable masses, tenderness or crepitance. The trachea is midline, low in the neck. Scar from prior trach. Thyroid gland is soft, nontender and symmetric with no masses or enlargement. Parotid and submandibular glands are soft, nontender and symmetric, without masses. Lymphatic: Cervical lymph nodes are without palpable lymphadenopathy or tenderness. Respiratory: Patient on ventilator for respiratory support Cardiovascular: Carotid pulse shows regular rate and rhythm Neurologic: Unable to assess. Patient on vent and sedated Eyes: Unable to assess. Patient on vent and sedated  MEDICAL DECISION MAKING: Data Review:  Results for orders placed or performed during the hospital encounter of 01/01/20 (from the past 48 hour(s))  Glucose, capillary     Status: Abnormal   Collection Time: 01/10/20 11:22 AM  Result Value Ref Range   Glucose-Capillary 147 (H) 70 - 99 mg/dL    Comment: Glucose reference range applies only to samples  taken after fasting for at least 8 hours.  Glucose, capillary     Status: Abnormal   Collection Time: 01/10/20  3:57 PM  Result Value Ref Range   Glucose-Capillary 128 (H) 70 - 99 mg/dL    Comment: Glucose reference range applies only to samples taken after fasting for at least 8 hours.  Blood gas, arterial     Status: Abnormal   Collection Time: 01/10/20  4:17 PM  Result Value Ref Range   FIO2 35.00    Delivery systems VENTILATOR    Mode PRESSURE REGULATED VOLUME CONTROL    VT 500 mL   LHR 28 resp/min   Peep/cpap 5.0 cm H20   pH, Arterial 7.35 7.35 - 7.45   pCO2 arterial 55 (H) 32 - 48 mmHg   pO2, Arterial 89 83 - 108 mmHg   Bicarbonate  30.4 (H) 20.0 - 28.0 mmol/L   Acid-Base Excess 3.4 (H) 0.0 - 2.0 mmol/L   O2 Saturation 96.4 %   Patient temperature 37.0    Collection site RIGHT RADIAL    Sample type ARTHROGRAPHIS SPECIES    Allens test (pass/fail) POSITIVE (A) PASS    Comment: Performed at [facility], [address] [place], [place] [address]  Triglycerides     Status: Abnormal   Collection Time: 01/10/20  4:21 PM  Result Value Ref Range   Triglycerides 152 (H) <150 mg/dL    Comment: Performed at [facility], [address] [place], [place] [address]  Glucose, capillary     Status: Abnormal   Collection Time: 01/10/20  7:42 PM  Result Value Ref Range   Glucose-Capillary 131 (H) 70 - 99 mg/dL    Comment: Glucose reference range applies only to samples taken after fasting for at least 8 hours.  Glucose, capillary     Status: Abnormal   Collection Time: 01/11/20  3:53 AM  Result Value Ref Range   Glucose-Capillary 128 (H) 70 - 99 mg/dL    Comment: Glucose reference range applies only to samples taken after fasting for at least 8 hours.  Basic metabolic panel     Status: Abnormal   Collection Time: 01/11/20  5:50 AM  Result Value Ref Range   Sodium 146 (H) 135 - 145 mmol/L   Potassium 4.8 3.5 - 5.1 mmol/L   Chloride 109 98 - 111 mmol/L   CO2 29 22 - 32 mmol/L   Glucose, Bld 128 (H) 70 - 99 mg/dL    Comment: Glucose reference range applies only to samples taken after fasting for at least 8 hours.   BUN 47 (H) 6 - 20 mg/dL   Creatinine, Ser 1.54 (H) 0.61 - 1.24 mg/dL   Calcium 8.8 (L) 8.9 - 10.3 mg/dL   GFR, Estimated 55 (L) >60 mL/min    Comment: (NOTE) Calculated using the CKD-EPI Creatinine Equation (2021)    Anion gap 8 5 - 15    Comment: Performed at [facility], [address] [place], [place] [address]  Magnesium     Status: None   Collection Time: 01/11/20  5:50 AM  Result Value Ref Range   Magnesium 2.2 1.7 - 2.4 mg/dL    Comment: Performed at [facility], [address] [place], [place] [address]  Phosphorus     Status: None   Collection Time: 01/11/20  5:50 AM  Result Value Ref Range   Phosphorus 3.7 2.5 - 4.6 mg/dL    Comment: Performed at [facility], [address] [place], [place] [address]  Glucose, capillary     Status: Abnormal   Collection Time: 01/11/20  7:12 AM  Result Value Ref Range   Glucose-Capillary 126 (H) 70 - 99 mg/dL    Comment: Glucose reference range applies only to samples taken after fasting for at least 8 hours.  Glucose, capillary     Status: Abnormal   Collection Time: 01/11/20 11:02 AM  Result Value Ref Range   Glucose-Capillary 102 (H) 70 - 99 mg/dL    Comment: Glucose reference range applies only to samples taken after fasting for at least 8 hours.  Glucose, capillary     Status: Abnormal   Collection Time: 01/11/20  4:37 PM  Result Value Ref Range   Glucose-Capillary 125 (H) 70 - 99 mg/dL    Comment: Glucose reference range applies only to samples taken after fasting for at least 8 hours.  Glucose, capillary     Status: Abnormal   Collection Time: 01/11/20  7:25 PM  Result Value Ref Range   Glucose-Capillary 111 (H) 70 - 99 mg/dL    Comment: Glucose reference range applies only to samples taken after fasting for at least 8 hours.  Glucose, capillary     Status: Abnormal   Collection Time: 01/11/20 11:40 PM  Result Value Ref Range   Glucose-Capillary 151 (H) 70 - 99 mg/dL    Comment: Glucose reference range applies only to samples taken after fasting for at least 8 hours.  Glucose, capillary     Status: Abnormal   Collection Time: 01/12/20  4:25 AM  Result Value Ref Range   Glucose-Capillary 137 (H) 70 - 99 mg/dL    Comment: Glucose reference range applies only to samples taken after fasting for at least 8 hours.  Basic metabolic panel     Status: Abnormal   Collection Time: 01/12/20  6:15 AM  Result Value Ref Range   Sodium 143 135 - 145 mmol/L   Potassium 4.6 3.5 - 5.1 mmol/L    Chloride 109 98 - 111 mmol/L   CO2 29 22 - 32 mmol/L   Glucose, Bld 158 (H) 70 - 99 mg/dL    Comment: Glucose reference range applies only to samples taken after fasting for at least 8 hours.   BUN 55 (H) 6 - 20 mg/dL   Creatinine, Ser 1.58 (H) 0.61 - 1.24 mg/dL   Calcium 8.5 (L) 8.9 - 10.3 mg/dL   GFR, Estimated 53 (L) >60 mL/min    Comment: (NOTE) Calculated using the CKD-EPI Creatinine Equation (2021)    Anion gap 5 5 - 15    Comment: Performed at [facility], [address] [place], [place] [address]  Magnesium     Status: None   Collection Time: 01/12/20  6:15 AM  Result Value Ref Range   Magnesium 2.2 1.7 - 2.4 mg/dL    Comment: Performed at [facility], [address] [place], [place] [address]  Phosphorus     Status: Abnormal   Collection Time: 01/12/20  6:15 AM  Result Value Ref Range   Phosphorus 5.2 (H) 2.5 - 4.6 mg/dL    Comment: Performed at [facility], [address] [place], [place] [address]  CBC     Status: Abnormal   Collection Time: 01/12/20  6:15 AM  Result Value Ref Range   WBC 8.2 4.0 - 10.5 K/uL   RBC 4.21 (L) 4.22 - 5.81 MIL/uL   Hemoglobin 12.0 (L) 13.0 - 17.0 g/dL  HCT 39.4 39 - 52 %   MCV 93.6 80.0 - 100.0 fL   MCH 28.5 26.0 - 34.0 pg   MCHC 30.5 30.0 - 36.0 g/dL   RDW 13.2 11.5 - 15.5 %   Platelets 144 (L) 150 - 400 K/uL   nRBC 0.0 0.0 - 0.2 %    Comment: Performed at [facility], [address] [place], [place] [address]  Glucose, capillary     Status: Abnormal   Collection Time: 01/12/20  7:04 AM  Result Value Ref Range   Glucose-Capillary 153 (H) 70 - 99 mg/dL    Comment: Glucose reference range applies only to samples taken after fasting for at least 8 hours.  Darletta Moll Chest Port 1 View  Result Date: 01/12/2020 CLINICAL DATA:  Intubation EXAM: PORTABLE CHEST 1 VIEW COMPARISON:  Three days ago FINDINGS: Low volume chest with bands of opacity on both sides correlating with history of sarcoid.  Endotracheal tube is in good position in the enteric tube at least reaches the stomach. Normal heart size for technique. IMPRESSION: 1. Stable hardware positioning. 2. Low volume chest with bilateral scarring. Aeration is mildly improved from prior. Electronically Signed   By: Monte Fantasia M.D.   On: 01/12/2020 05:08    ASSESSMENT: Respiratory failure PLAN: Critical care consult involving over 31 minutes of consultation, chart review, examination, discussion with family, and care coordination. Will schedule tracheostomy. Risks including bleeding, infection, scarring, recurrent laryngeal nerve injury to be discussed with patient's family and consent obtained.   Patient remains on Eliquis, last dose this AM, so this will mean trach will be scheduled next week.   Corey Nearing, MD 01/12/2020 10:11 AM

## 2020-01-12 NOTE — Consult Note (Signed)
PHARMACY CONSULT NOTE  Pharmacy Consult for Electrolyte Monitoring and Replacement   Recent Labs: Potassium (mmol/L)  Date Value  01/12/2020 4.6  05/28/2014 3.9   Magnesium (mg/dL)  Date Value  01/12/2020 2.2  05/14/2013 1.8   Calcium (mg/dL)  Date Value  01/12/2020 8.5 (L)   Calcium, Total (mg/dL)  Date Value  05/28/2014 8.4 (L)   Albumin (g/dL)  Date Value  01/08/2020 2.6 (L)  06/01/2013 3.8   Phosphorus (mg/dL)  Date Value  01/12/2020 5.2 (H)  03/09/2013 3.0   Sodium (mmol/L)  Date Value  01/12/2020 143  05/28/2014 140   Corrected Ca: 8.3 mg/dL  Assessment: 49 year old male with PMHx of HTN, DM, COPD, CAD, hx CVA, sarcoidosis, CHF, afib on Eliquis, CKD 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  12/7: Scr continues to improve / stable, nearing baseline. Good UOP but decreased from previous days. Blood pressure support started late yesterday.   Goal of Therapy:  Potassium 4.0 - 5.1 mmol/L Magnesium 2.0 - 2.4 mg/dL Electrolytes WNL  Plan:   Hyperkalemia resolved. Potassium improved to 4.6 today  Sodium 146 >> 143. Patient is receiving free water 200 mL q4h. Will continue to monitor.   Will sign off on consult at this time given no requirement for electrolyte replacement for several days. Will continue to monitor peripherally  Benita Gutter 01/12/2020 8:43 AM

## 2020-01-12 NOTE — Progress Notes (Signed)
CRITICAL CARE NOTE 49 y.o.male.with medical history significant ofchronic combined systolic and diastolic CHF, COPD, CAD, history of MI, type 2 diabetes, hyperlipidemia, hypertension, sarcoidosis, sleep apnea not using Trilogy vent regularly, he has chronic hypercapnic respiratory failure due to end-stage lung disease.Previous h/o [facility] AND LTACH, admitted with acute on chronic mixed respiratory failure requiring intubation PCCM consulted.  11/27 ICU admission, INTUBATED #[id number] 11/28 severe resp failure, progressive Renal failure, Nephrology consulted 11/29 - patient passed SBT today, able to communicate/follow directions while intubated status post extubation to BIPAP - after 1 hour on BIPAP patient became unresponsive and required re-intubation. I have met with mother in law at bedside and reviewed careplan.  11/30- patient remains on ventilator. He has thickened mucopurulent tracheal secretions, he can follow verbal instructions but failed SBT today. GPC+ tracheal aspirate culture. 01/06/20-SBT for extubation, failed with heavy secretions. TOC consult today.  01/07/20-patient failed SBT after 10 minutes with acute hypoxemia. PALS consult for VDRF. Spoke with Wife Patrice about trache she wants to wait for several days 01/08/20:Palliative care discussing with family, likelihood patient will remain ventilator dependent, continues to fail SBT's has had prior episode of trach and LTACH stay. 01/10/2020:Tolerating SBT's better on current sedation Precedex/fentanyl-calm, communicates with ICU communication board tool 12/6 severe end stage COPD, will need trach, wife updated    CC  follow up respiratory failure  HPI Patient remains critically ill Prognosis is guarded Severe COPD    BP 98/64 (BP Location: Right Arm)   Pulse 71   Temp 98.2 F (36.8 C) (Bladder)   Resp 20   Ht 6' 0.99" (1.854 m)   Wt 120 kg   SpO2 96%   BMI 34.91 kg/m    I/O last 3 completed shifts: In:  7968.5 [I.V.:2769.1; WH/QP:5916.3; IV Piggyback:150] Out: 8466 [Urine:3075] No intake/output data recorded.  SpO2: 96 % O2 Flow Rate (L/min): 3 L/min FiO2 (%): 35 %  Estimated body mass index is 34.91 kg/m as calculated from the following:   Height as of this encounter: 6' 0.99" (1.854 m).   Weight as of this encounter: 120 kg.  SIGNIFICANT EVENTS   REVIEW OF SYSTEMS  PATIENT IS UNABLE TO PROVIDE COMPLETE REVIEW OF SYSTEMS DUE TO SEVERE CRITICAL ILLNESS        PHYSICAL EXAMINATION:  GENERAL:critically ill appearing, +resp distress HEAD: Normocephalic, atraumatic.  EYES: Pupils equal, round, reactive to light.  No scleral icterus.  MOUTH: Moist mucosal membrane. NECK: Supple.  PULMONARY: +rhonchi, +wheezing CARDIOVASCULAR: S1 and S2. Regular rate and rhythm. No murmurs, rubs, or gallops.  GASTROINTESTINAL: Soft, nontender, -distended.  Positive bowel sounds.   MUSCULOSKELETAL: No swelling, clubbing, or edema.  NEUROLOGIC: obtunded, GCS<8 SKIN:intact,warm,dry  MEDICATIONS: I have reviewed all medications and confirmed regimen as documented   CULTURE RESULTS   Recent Results (from the past 240 hour(s))  MRSA PCR Screening     Status: None   Collection Time: 01/02/20  2:45 PM   Specimen: Nasopharyngeal  Result Value Ref Range Status   MRSA by PCR NEGATIVE NEGATIVE Final    Comment:        The GeneXpert MRSA Assay (FDA approved for NASAL specimens only), is one component of a comprehensive MRSA colonization surveillance program. It is not intended to diagnose MRSA infection nor to guide or monitor treatment for MRSA infections. Performed at [facility], [address] [place], [place] [address]   Culture, respiratory     Status: None   Collection Time: 01/02/20  5:00 PM  Specimen: INDUCED SPUTUM  Result Value Ref Range Status   Specimen Description   Final    INDUCED SPUTUM Performed at [facility], [address],  [place], [place] [address]    Special Requests   Final    NONE Performed at [facility], [address] [place], [place] [address]    Gram Stain   Final    NO WBC SEEN RARE GRAM POSITIVE COCCI IN PAIRS IN CHAINS    Culture   Final    FEW Normal respiratory flora-no Staph aureus or Pseudomonas seen Performed at [facility], [address] [place], [place] [address]    Report Status 01/05/2020 FINAL  Final  Culture, respiratory     Status: None   Collection Time: 01/04/20 11:14 AM   Specimen: Tracheal Aspirate; Respiratory  Result Value Ref Range Status   Specimen Description   Final    TRACHEAL ASPIRATE Performed at [facility], [address] [place], [place] [address]    Special Requests   Final    NONE Performed at [facility], [address] [place], [place] [address]    Gram Stain   Final    FEW WBC PRESENT, PREDOMINANTLY PMN NO ORGANISMS SEEN    Culture   Final    NO GROWTH 2 DAYS Performed at [facility] [address] [place], [place] [address]    Report Status 01/07/2020 FINAL  Final  Culture, respiratory (non-expectorated)     Status: None (Preliminary result)   Collection Time: 01/09/20  7:55 PM   Specimen: Tracheal Aspirate; Respiratory  Result Value Ref Range Status   Specimen Description   Final    TRACHEAL ASPIRATE Performed at [facility], [address], [place], [place] [address]    Special Requests   Final    NONE Performed at [facility], [address] [place], [place] [address]    Gram Stain   Final    RARE WBC PRESENT,BOTH PMN AND MONONUCLEAR RARE GRAM POSITIVE COCCI IN PAIRS IN CHAINS RARE GRAM NEGATIVE RODS    Culture   Final    CULTURE REINCUBATED FOR BETTER GROWTH Performed at [facility], [place] [address] [place], [place] [address]    Report Status PENDING  Incomplete          IMAGING    DG Chest Port 1 View  Result Date: 01/12/2020 CLINICAL DATA:   Intubation EXAM: PORTABLE CHEST 1 VIEW COMPARISON:  Three days ago FINDINGS: Low volume chest with bands of opacity on both sides correlating with history of sarcoid. Endotracheal tube is in good position in the enteric tube at least reaches the stomach. Normal heart size for technique. IMPRESSION: 1. Stable hardware positioning. 2. Low volume chest with bilateral scarring. Aeration is mildly improved from prior. Electronically Signed   By: Monte Fantasia M.D.   On: 01/12/2020 05:08     Nutrition Status: Nutrition Problem: Inadequate oral intake Etiology: inability to eat Signs/Symptoms: NPO status Interventions: Tube feeding, Prostat, MVI     Indwelling Urinary Catheter continued, requirement due to   Reason to continue Indwelling Urinary Catheter strict Intake/Output monitoring for hemodynamic instability   Central Line/ continued, requirement due to  Reason to continue [place] of central venous pressure or other hemodynamic parameters and poor IV access   Ventilator continued, requirement due to severe respiratory failure   Ventilator Sedation RASS 0 to -2  ASSESSMENT AND PLAN SYNOPSIS 49 yo AAM with severe morbid obesity with biventricular heart failure with acute on chronic resp failue with hypoxia and hypercapnia due to acute COPD exacerbation with acute CAP with sepsis, present on admission, with severe metabolic encephalopathy from hypoxia and hypercapnia with progressive renal failure. Required intubation.  Severe ACUTE Hypoxic and Hypercapnic Respiratory Failure -continue Full MV support -continue Bronchodilator Therapy -Wean Fio2 and PEEP as tolerated -VAP/VENT bundle implementation Will need trach    ACUTE DIASTOLIC CARDIAC FAILURE-  -oxygen as needed -Lasix as tolerated   Morbid obesity, possible OSA.   Will certainly impact respiratory mechanics, ventilator weaning Suspect will need to consider additional PEEP  SEVERE COPD  EXACERBATION -continue IV steroids as prescribed -continue NEB THERAPY as prescribed -morphine as needed -wean fio2 as needed and tolerated   ACUTE KIDNEY INJURY/Renal Failure -continue Foley Catheter-assess need -Avoid nephrotoxic agents -Follow urine output, BMP -Ensure adequate renal perfusion, optimize oxygenation -Renal dose medications     NEUROLOGY - intubated and sedated - minimal sedation to achieve a RASS goal: -1  CARDIAC ICU monitoring  ID -continue IV abx as prescibed -follow up cultures  GI GI PROPHYLAXIS as indicated  NUTRITIONAL STATUS Nutrition Status: Nutrition Problem: Inadequate oral intake Etiology: inability to eat Signs/Symptoms: NPO status Interventions: Tube feeding, Prostat, MVI   DIET-->TF's as tolerated Constipation protocol as indicated  ENDO - will use ICU hypoglycemic\Hyperglycemia protocol if indicated     ELECTROLYTES -follow labs as needed -replace as needed -pharmacy consultation and following   DVT/GI PRX ordered and assessed TRANSFUSIONS AS NEEDED MONITOR FSBS I Assessed the need for Labs I Assessed the need for Foley I Assessed the need for Central Venous Line Family Discussion when available I Assessed the need for Mobilization I made an Assessment of medications to be adjusted accordingly Safety Risk assessment completed   CASE DISCUSSED IN MULTIDISCIPLINARY ROUNDS WITH ICU TEAM  Critical Care Time devoted to patient care services described in this note is 54 minutes.   Overall, patient is critically ill, prognosis is guarded.  Patient with Multiorgan failure and at high risk for cardiac arrest and death.    Corrin Parker, M.D.  [facility]  Medical Director [place] Director [facility] Cardio-Pulmonary Department

## 2020-01-12 NOTE — TOC Initial Note (Signed)
Transition of Care [facility]) - Initial/Assessment Note    Patient Details  Name: [name] MRN: [id number] Date of Birth: 03-17-1970  Transition of Care [facility]) CM/SW Contact:    Ova Freshwater Phone Number: [phone] 01/12/2020, 12:28 PM  Clinical Narrative:                  CSW spoke with patient's spouse [name] Cathell [phone] and explained TOC role in patient care.  Ms. Lenderman stated patient is able to perform all ADLs without assistance and is able to drive himself to his doctor's appointments.  Ms. Letourneau state the patient is currently on O2 24/7, 2L and bi-pap to sleep, [employer] supplies O2 supplies.  CSW stated there is the possibility the patient will be stable enough to discharge to an LTAC and I wanted to know if her choice was for the patient to return to [facility] which he has been to in the past.  Ms. Palma stated she would like for him to go to [facility] but would like to speak with the patient's daughter for her agreement.  Ms. Hanf stated she would contact this CSW when she had a reply from the patient's daughter.  CSW stated I would keep Ms. Elbaum updated. Ms. Walkowski verbalized understanding.    Expected Discharge Plan: Long Term Acute Care (LTAC) Barriers to Discharge: Continued Medical Work up   Patient Goals and CMS Choice   CMS Medicare.gov Compare Post Acute Care list provided to:: Other (Comment Required) (Bahe,[name] [phone]) Choice offered to / list presented to :  (Moscato,[name] [phone])  Expected Discharge Plan and Services Expected Discharge Plan: Long Term Acute Care (LTAC) In-house Referral: Clinical Social Work   Post Acute Care Choice: Long Term Acute Care (LTAC) Living arrangements for the past 2 months: Single Family Home                                      Prior Living Arrangements/Services Living arrangements for the past 2 months: Single Family Home Lives  with:: Spouse Patient language and need for interpreter reviewed:: Yes        Need for Family Participation in Patient Care: Yes (Comment) Care giver support system in place?: Yes (comment) Current home services: DME, Other (comment) (Oxygen, 2L 24/7, sleeps with bipap) Criminal Activity/Legal Involvement Pertinent to Current Situation/Hospitalization: No - Comment as needed  Activities of Daily Living      Permission Sought/Granted Permission sought to share information with : Family Supports    Share Information with NAME: Winokur,[name] [phone]  Permission granted to share info w AGENCY: [facility]        Emotional Assessment Appearance:: Appears stated age Attitude/Demeanor/Rapport: Unable to Assess Affect (typically observed): Unable to Assess   Alcohol / Substance Use: Not Applicable Psych Involvement: No (comment)  Admission diagnosis:  Shortness of breath [R06.02] Hypercapnia [R06.89] COPD exacerbation (HCC) [J44.1] Acute respiratory failure with hypoxia and hypercapnia (HCC) [J96.01, J96.02] Acute and chr resp failure, unsp w hypoxia or hypercapnia ([place]) [J96.20] Patient Active Problem List   Diagnosis Date Noted  . Acute and chr resp failure, unsp w hypoxia or hypercapnia ([place]) 01/02/2020  . Acute respiratory failure with hypoxia and hypercapnia ([place]) 01/01/2020  . CKD stage 3 due to type 2 diabetes mellitus ([place]) 01/01/2020  . Respiratory failure with hypercapnia ([place]) 07/31/2019  . Acute on  chronic respiratory failure with hypoxia ([place]) 06/10/2019  . Acute on chronic respiratory failure with hypoxia and hypercapnia ([place]) 11/17/2018  . Acute exacerbation of CHF (congestive heart failure) ([place]) 11/07/2018  . Chest pain 11/06/2018  . Acute exacerbation of chronic obstructive pulmonary disease (COPD) ([place]) 07/29/2018  . COPD with acute exacerbation ([place]) 02/03/2018  . COPD (chronic obstructive pulmonary disease) ([place]) 07/16/2016  .  Chronic diastolic heart failure ([place]) 03/23/2016  . Hypercarbia   . COPD exacerbation ([place]) 11/17/2015  . HTN (hypertension) 06/14/2015  . Dental caries 03/08/2015  . Morbid obesity ([place]) 02/21/2015  . OSA and COPD overlap syndrome ([place]) 02/21/2015  . Thrombocytopenia ([place]) 02/21/2015  . Hypernatremia 02/21/2015  . Type 2 diabetes mellitus ([place]) 10/19/2014  . Chronic combined systolic and diastolic congestive heart failure ([place]) 10/18/2014  . Chronic obstructive pulmonary disease ([place]) 10/18/2014  . Sarcoidosis 04/22/2013  . Hypercholesteremia 04/22/2013   PCP:  [place]:   [facility] [address] ([address]), [place] [address] [place]) [place] [address] Phone: [phone] Fax: [phone]     Social Determinants of Health (SDOH) Interventions    Readmission Risk Interventions Readmission Risk Prevention Plan 11/19/2018 07/30/2018 07/29/2018  Transportation Screening Complete Complete Complete  PCP or Specialist Appt within 3-5 Days - Complete Complete  HRI or [place] - Complete Complete  Social Work Consult for [place] Planning/Counseling - - Patient refused  Palliative Care Screening - Not Applicable Not Applicable  Medication Review (RN Care Manager) Referral to Pharmacy Complete Complete  PCP or Specialist appointment within 3-5 days of discharge Complete - -  [place] or Home Care Consult Patient refused - -  SW Recovery Care/Counseling Consult Complete - -  Palliative Care Screening Not Applicable - -  [place] Not Applicable - -  Some recent data might be hidden

## 2020-01-12 NOTE — Progress Notes (Signed)
Shift note: Pt remains on sedation pending trach placement. Wake up assessment deferred today per MD order. Mult failed assessments noted. Pt is afebrile, vs have been stable with no BP Support needed this shift. Pt has been calm with no distress noted. Remains dependant on ventilator for respiratory support. F/c in place draining clear yellow urine.

## 2020-01-13 DIAGNOSIS — J9601 Acute respiratory failure with hypoxia: Secondary | ICD-10-CM | POA: Diagnosis not present

## 2020-01-13 DIAGNOSIS — I5042 Chronic combined systolic (congestive) and diastolic (congestive) heart failure: Secondary | ICD-10-CM | POA: Diagnosis not present

## 2020-01-13 DIAGNOSIS — J9620 Acute and chronic respiratory failure, unspecified whether with hypoxia or hypercapnia: Secondary | ICD-10-CM | POA: Diagnosis not present

## 2020-01-13 DIAGNOSIS — J9602 Acute respiratory failure with hypercapnia: Secondary | ICD-10-CM | POA: Diagnosis not present

## 2020-01-13 LAB — GLUCOSE, CAPILLARY
Glucose-Capillary: 115 mg/dL — ABNORMAL HIGH (ref 70–99)
Glucose-Capillary: 118 mg/dL — ABNORMAL HIGH (ref 70–99)
Glucose-Capillary: 121 mg/dL — ABNORMAL HIGH (ref 70–99)
Glucose-Capillary: 139 mg/dL — ABNORMAL HIGH (ref 70–99)
Glucose-Capillary: 142 mg/dL — ABNORMAL HIGH (ref 70–99)
Glucose-Capillary: 147 mg/dL — ABNORMAL HIGH (ref 70–99)
Glucose-Capillary: 152 mg/dL — ABNORMAL HIGH (ref 70–99)

## 2020-01-13 LAB — PHOSPHORUS: Phosphorus: 4.4 mg/dL (ref 2.5–4.6)

## 2020-01-13 LAB — CBC
HCT: 43.1 % (ref 39.0–52.0)
Hemoglobin: 13.4 g/dL (ref 13.0–17.0)
MCH: 28.8 pg (ref 26.0–34.0)
MCHC: 31.1 g/dL (ref 30.0–36.0)
MCV: 92.7 fL (ref 80.0–100.0)
Platelets: 171 10*3/uL (ref 150–400)
RBC: 4.65 MIL/uL (ref 4.22–5.81)
RDW: 13.1 % (ref 11.5–15.5)
WBC: 7.5 10*3/uL (ref 4.0–10.5)
nRBC: 0 % (ref 0.0–0.2)

## 2020-01-13 LAB — APTT
aPTT: 60 seconds — ABNORMAL HIGH (ref 24–36)
aPTT: 61 seconds — ABNORMAL HIGH (ref 24–36)
aPTT: 80 seconds — ABNORMAL HIGH (ref 24–36)

## 2020-01-13 LAB — BASIC METABOLIC PANEL
Anion gap: 8 (ref 5–15)
BUN: 53 mg/dL — ABNORMAL HIGH (ref 6–20)
CO2: 26 mmol/L (ref 22–32)
Calcium: 8.7 mg/dL — ABNORMAL LOW (ref 8.9–10.3)
Chloride: 110 mmol/L (ref 98–111)
Creatinine, Ser: 1.39 mg/dL — ABNORMAL HIGH (ref 0.61–1.24)
GFR, Estimated: >60 mL/min (ref >60)
Glucose, Bld: 143 mg/dL — ABNORMAL HIGH (ref 70–99)
Potassium: 4.9 mmol/L (ref 3.5–5.1)
Sodium: 144 mmol/L (ref 135–145)

## 2020-01-13 LAB — MAGNESIUM: Magnesium: 2.2 mg/dL (ref 1.7–2.4)

## 2020-01-13 MED ORDER — BISACODYL 10 MG RE SUPP
10.0000 mg | Freq: Every day | RECTAL | Status: DC | PRN
  Administered 2020-01-14: 10 mg via RECTAL
  Filled 2020-01-13: qty 1

## 2020-01-13 MED ORDER — HEPARIN (PORCINE) 25000 UT/250ML-% IV SOLN: 1700.0000 [IU]/h | INTRAVENOUS | Status: DC

## 2020-01-13 NOTE — Progress Notes (Signed)
[place], [place] 01/13/20  Subjective:   Hospital day # 11 Patient still on the ventilator. Creatinine improved down to 1.39. Good urine output noted.  Renal: 12/07 0701 - 12/08 0700 In: 2436.6 [I.V.:1666.6; NG/GT:720; IV Piggyback:50] Out: 2150 [Urine:2150] Lab Results  Component Value Date   CREATININE 1.39 (H) 01/13/2020   CREATININE 1.58 (H) 01/12/2020   CREATININE 1.54 (H) 01/11/2020     Objective:  Vital signs in last 24 hours:  Temp:  [98.2 F (36.8 C)-99.5 F (37.5 C)] 99.3 F (37.4 C) (12/08 1000) Pulse Rate:  [70-80] 78 (12/08 1000) Resp:  [12-24] 23 (12/08 1000) BP: (91-124)/(58-83) 91/58 (12/08 1000) SpO2:  [92 %-97 %] 96 % (12/08 1000) FiO2 (%):  [30 %] 30 % (12/08 0739) Weight:  [121.9 kg] 121.9 kg (12/08 0445)  Weight change: 1.9 kg Filed Weights   01/11/20 0500 01/12/20 0500 01/13/20 0445  Weight: 118.7 kg 120 kg 121.9 kg    Intake/Output:    Intake/Output Summary (Last 24 hours) at 01/13/2020 1040 Last data filed at 01/13/2020 0600 Gross per 24 hour  Intake 2436.6 ml  Output 2100 ml  Net 336.6 ml     Physical Exam: General: Critically ill appearing  HEENT ETT in place  Pulm/lungs Vent assisted, scattered rhonchi  CVS/Heart S1S2 no rubs  Abdomen:  Soft nontender, nondistended  Extremities: 1+ lower extremity edema  Neurologic: On sedation  Skin: warm  GU: Foley in place with clear yellow urine       Basic Metabolic Panel:  Recent Labs  Lab 01/09/20 0433 01/09/20 0433 01/10/20 0506 01/10/20 0506 01/11/20 0550 01/12/20 0615 01/13/20 0552  NA 147*  --  144  --  146* 143 144  K 4.9  --  5.3*  --  4.8 4.6 4.9  CL 109  --  109  --  109 109 110  CO2 28  --  28  --  _0 GLUCOSE 125*  --  130*  --  128* 158* 143*  BUN 72*  --  57*  --  47* 55* 53*  CREATININE 2.03*  --  1.72*  --  1.54* 1.58* 1.39*  CALCIUM 8.6*   < > 9.0   < > 8.8* 8.5* 8.7*  MG 2.5*  --  2.5*  --  2.2 2.2 2.2  PHOS 4.4   --  3.8  --  3.7 5.2* 4.4   < > = values in this interval not displayed.     CBC: Recent Labs  Lab 01/07/20 0621 01/07/20 0621 01/08/20 0451 01/09/20 0433 01/10/20 0506 01/12/20 0615 01/13/20 0552  WBC 5.2   < > 4.3 5.7 6.9 8.2 7.5  NEUTROABS 2.8  --  2.1 3.7 5.0  --   --   HGB 13.4   < > 12.5* 13.5 13.9 12.0* 13.4  HCT 43.4   < > 40.7 43.3 44.9 39.4 43.1  MCV 92.1   < > 93.6 92.3 90.5 93.6 92.7  PLT 153   < > 150 157 157 144* 171   < > = values in this interval not displayed.      Lab Results  Component Value Date   HEPBSAG NON REACTIVE 01/03/2020   HEPBSAB NON REACTIVE 01/03/2020   HEPBIGM NON REACTIVE 01/03/2020      Microbiology:  Recent Results (from the past 240 hour(s))  Culture, respiratory     Status: None   Collection Time: 01/04/20 11:14 AM   Specimen: Tracheal Aspirate;  Respiratory  Result Value Ref Range Status   Specimen Description   Final    TRACHEAL ASPIRATE Performed at [facility], [address] [place], [place] [address]    Special Requests   Final    NONE Performed at [facility], [address] [place], [place] [address]    Gram Stain   Final    FEW WBC PRESENT, PREDOMINANTLY PMN NO ORGANISMS SEEN    Culture   Final    NO GROWTH 2 DAYS Performed at [facility] [address], [place], [place] [address]    Report Status 01/07/2020 FINAL  Final  Culture, respiratory (non-expectorated)     Status: None   Collection Time: 01/09/20  7:55 PM   Specimen: Tracheal Aspirate; Respiratory  Result Value Ref Range Status   Specimen Description   Final    TRACHEAL ASPIRATE Performed at [facility], [address] [place], [place] [address]    Special Requests   Final    NONE Performed at [facility], [address] [place], [place] [address]    Gram Stain   Final    RARE WBC PRESENT,BOTH PMN AND MONONUCLEAR RARE GRAM POSITIVE COCCI IN PAIRS IN CHAINS RARE GRAM NEGATIVE RODS     Culture   Final    FEW Normal respiratory flora-no Staph aureus or Pseudomonas seen Performed at [facility], [place] [address] [place], [place] [address]    Report Status 01/12/2020 FINAL  Final    Coagulation Studies: Recent Labs    01/12/20 1156  LABPROT 15.3*  INR 1.3*    Urinalysis: No results for input(s): COLORURINE, LABSPEC, PHURINE, GLUCOSEU, HGBUR, BILIRUBINUR, KETONESUR, PROTEINUR, UROBILINOGEN, NITRITE, LEUKOCYTESUR in the last 72 hours.  Invalid input(s): APPERANCEUR    Imaging: DG Chest Port 1 View  Result Date: 01/12/2020 CLINICAL DATA:  Intubation EXAM: PORTABLE CHEST 1 VIEW COMPARISON:  Three days ago FINDINGS: Low volume chest with bands of opacity on both sides correlating with history of sarcoid. Endotracheal tube is in good position in the enteric tube at least reaches the stomach. Normal heart size for technique. IMPRESSION: 1. Stable hardware positioning. 2. Low volume chest with bilateral scarring. Aeration is mildly improved from prior. Electronically Signed   By: Monte Fantasia M.D.   On: 01/12/2020 05:08     Medications:   . sodium chloride    . sodium chloride 10 mL/hr at 01/11/20 2000  . sodium chloride Stopped (01/12/20 1150)  . dexmedetomidine (PRECEDEX) IV infusion 0.8 mcg/kg/hr (01/13/20 3300)  . famotidine (PEPCID) IV 20 mg (01/13/20 0853)  . feeding supplement (VITAL HIGH PROTEIN) 60 mL/hr at 01/13/20 0600  . fentaNYL infusion INTRAVENOUS 325 mcg/hr (01/13/20 0855)  . heparin 1,600 Units/hr (01/13/20 0631)  . midazolam 4 mg/hr (01/13/20 0600)  . norepinephrine (LEVOPHED) Adult infusion 1 mcg/min (01/13/20 0600)   . acetylcysteine  4 mL Nebulization BID  . budesonide (PULMICORT) nebulizer solution  0.5 mg Nebulization BID  . chlorhexidine gluconate (MEDLINE KIT)  15 mL Mouth Rinse BID  . Chlorhexidine Gluconate Cloth  6 each Topical Q0600  . docusate  100 mg Per Tube BID  . feeding supplement (PROSource TF)  90 mL Per Tube BID  .  free water  200 mL Per Tube Q4H  . insulin aspart  0-20 Units Subcutaneous TID WC  . ipratropium-albuterol  3 mL Nebulization Q6H  . mouth rinse  15 mL Mouth Rinse 10 times per day  . metoCLOPramide (  REGLAN) injection  5 mg Intravenous Q8H  . midodrine  10 mg Per Tube BID WC  . multivitamin with minerals  1 tablet Per Tube Daily  . polyethylene glycol  17 g Per Tube Daily  . sennosides  5 mL Per Tube BID   acetaminophen **OR** acetaminophen, albuterol, fentaNYL, metoprolol tartrate, midazolam, ondansetron **OR** ondansetron (ZOFRAN) IV  Assessment/ Plan:  49 y.o. male with     admitted on 01/01/2020 for Shortness of breath [R06.02] Hypercapnia [R06.89] COPD exacerbation (HCC) [J44.1] Acute respiratory failure with hypoxia and hypercapnia (HCC) [J96.01, J96.02] Acute and chr resp failure, unsp w hypoxia or hypercapnia ([place]) [J96.20]   #. Acute kidney injury on chronic kidney disease stage IIIA with proteinuria: baseline creatinine of 1.41, GFR of 58 on 08/01/19.  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  Acute kidney injury secondary to ATN from hypotension and hypoxia Nonoliguric urine output. No IV contrast exposure.  Lab Results  Component Value Date   CREATININE 1.39 (H) 01/13/2020   CREATININE 1.58 (H) 01/12/2020   CREATININE 1.54 (H) 01/11/2020   - Renal ultrasound- unremarkable , no obstruction  12/07 0701 - 12/08 0700 In: 2436.6 [I.V.:1666.6; NG/GT:720; IV Piggyback:50] Out: 2150 [Urine:2150]   -Good urine output at 2.1 L noted over the preceding 24 hours.  Creatinine down to 1.39 which is at his baseline.  #.   Acute respiratory failure: requiring intubation and mechanical ventilation.  Acute exacerbation of COPD and sarcoidosis. 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  -Continue weaning protocol from the ventilator.  #. Diabetes mellitus type II with chronic kidney disease: insulin dependent.  Lab Results   Component Value Date   HGBA1C 6.5 (H) 01/02/2020  UPC 0.47, consider ARB once patient has recovery of renal function.    LOS: 11 [name] 12/8/202110:40 AM  [place], [place]  Note: This note was prepared with Dragon dictation. Any transcription errors are unintentional

## 2020-01-13 NOTE — Consult Note (Signed)
[facility] for Heparin Infusion Indication: atrial fibrillation  Patient Measurements: Height: 6' 0.99" (185.4 cm) Weight: 121.9 kg (268 lb 11.9 oz) IBW/kg (Calculated) : 79.88 Heparin Dosing Weight: 104 kg  Labs: Recent Labs    01/11/20 0550 01/12/20 0615 01/12/20 1156 01/13/20 0552  HGB  --  12.0*  --  13.4  HCT  --  39.4  --  43.1  PLT  --  144*  --  171  APTT  --   --  35 60*  LABPROT  --   --  15.3*  --   INR  --   --  1.3*  --   HEPARINUNFRC  --   --  1.64*  --   CREATININE 1.54* 1.58*  --   --     Estimated Creatinine Clearance: 77.4 mL/min (A) (by C-G formula based on SCr of 1.58 mg/dL (H)).   Medical History: Past Medical History:  Diagnosis Date  . CHF (congestive heart failure) (HCC)    EF 45-50%- 2020  . CKD (chronic kidney disease) stage 3, GFR 30-59 ml/min (HCC)   . Clear cell renal cell carcinoma (HCC)    s/p partial nephrectomy  . COPD (chronic obstructive pulmonary disease) ([place])   . Coronary artery disease   . Diabetes mellitus without complication ([place])   . Hypercholesteremia unk  . Hypertension   . Myocardial infarction ([place])   . Sarcoidosis   . Sleep apnea    does not use CPAP regularly.  . Stroke [facility])     Medications:  Apixaban 5 mg BID (last dose 12/7 at 0820)  Assessment: Patient is a 49 y/o M with medical history as above and including atrial fibrillation on apixaban who is admitted with respiratory failure requiring intubation and mechanical ventilation. Plan is for tracheostomy and apixaban has been put on hold pending procedure. Pharmacy has been consulted to initiate heparin infusion peri-operatively.   CBC today notable for Hgb 12 and platelets 144. Baseline aPTT, PT-INR, and anti-Xa level ordered and pending.   12/8 @ 0552 aPTT = 60, slightly subtherapeutic.  CBC stable.    Goal of Therapy:  aPTT 66 - 102 seconds Monitor platelets by anticoagulation protocol: Yes   Plan:  --Increase  heparin infusion to 1600 units/hr with no bolus.  --aPTT 6 hours after rate increase. Will follow aPTT for now given suspected interference of apixaban on anti-Xa level. Once correlation with aPTT and anti-Xa levels is established can switch to anti-Xa level monitoring --Daily CBC per protocol while on heparin infusion  Hart Robinsons A 01/13/2020,6:24 AM

## 2020-01-13 NOTE — Consult Note (Signed)
ANTICOAGULATION CONSULT NOTE  Pharmacy Consult for Heparin Infusion Indication: atrial fibrillation  Patient Measurements: Heparin Dosing Weight: 104 kg  Labs: Recent Labs    01/11/20 0550 01/12/20 0615 01/12/20 1156 01/13/20 0552 01/13/20 1320  HGB  --  12.0*  --  13.4  --   HCT  --  39.4  --  43.1  --   PLT  --  144*  --  171  --   APTT  --   --  35 60* 61*  LABPROT  --   --  15.3*  --   --   INR  --   --  1.3*  --   --   HEPARINUNFRC  --   --  1.64*  --   --   CREATININE 1.54* 1.58*  --  1.39*  --     Estimated Creatinine Clearance: 87.9 mL/min (A) (by C-G formula based on SCr of 1.39 mg/dL (H)).   Medical History: Past Medical History:  Diagnosis Date  . CHF (congestive heart failure) (HCC)    EF 45-50%- 2020  . CKD (chronic kidney disease) stage 3, GFR 30-59 ml/min (HCC)   . Clear cell renal cell carcinoma (HCC)    s/p partial nephrectomy  . COPD (chronic obstructive pulmonary disease) ([place])   . Coronary artery disease   . Diabetes mellitus without complication ([place])   . Hypercholesteremia unk  . Hypertension   . Myocardial infarction ([place])   . Sarcoidosis   . Sleep apnea    does not use CPAP regularly.  . Stroke [facility])     Medications:  Apixaban 5 mg BID (last dose 12/7 at 0820)  Assessment: Patient is a 49 y/o M with medical history as above and including atrial fibrillation on apixaban who is admitted with respiratory failure requiring intubation and mechanical ventilation. Plan is for tracheostomy and apixaban has been put on hold pending procedure. Pharmacy has been consulted to initiate heparin infusion peri-operatively.   Baseline aPTT 35s, INR 1.3, and anti-Xa level 1.64  CBC monitoring: Hgb 12 >> 13.4 Plt 144 >> 171  Heparin course: 12/7 at 2039 heparin drip started at 1450 units/hr 12/8 at 0552 aPTT = 60s. Heparin drip increased to 1600 units/hr 12/8 at 1320 aPTT = 61s. Heparin drip increased to 1700 units/hr  Goal of Therapy:  aPTT 66  - 102 seconds Monitor platelets by anticoagulation protocol: Yes   Plan:  --Increase heparin infusion to 1700 units/hr --aPTT 6 hours after rate increase. Will follow aPTT for now given suspected interference of apixaban on anti-Xa level. Once correlation with aPTT and anti-Xa levels is established can switch to anti-Xa level monitoring --Daily CBC per protocol while on heparin infusion --Discontinue heparin drip 12/9 at 0000 in anticipation of tracheostomy 12/9  Benita Gutter 01/13/2020,1:56 PM

## 2020-01-13 NOTE — Anesthesia Preprocedure Evaluation (Signed)
Anesthesia Evaluation  Patient identified by MRN, date of birth, ID band Patient unresponsive    Reviewed: Allergy & Precautions, H&P , NPO status , Patient's Chart, lab work & pertinent test results  Airway Mallampati: Intubated  TM Distance: <3 FB Neck ROM: limited    Dental  (+) Chipped   Pulmonary sleep apnea , COPD,  COPD inhaler and oxygen dependent, Current Smoker,    + rhonchi  + decreased breath sounds      Cardiovascular hypertension, + CAD and + Past MI  Normal cardiovascular exam     Neuro/Psych CVA, Residual Symptoms negative psych ROS   GI/Hepatic negative GI ROS, Neg liver ROS,   Endo/Other  diabetes  Renal/GU Renal disease     Musculoskeletal   Abdominal   Peds  Hematology negative hematology ROS (+)   Anesthesia Other Findings Past Medical History: No date: CHF (congestive heart failure) (HCC)     Comment:  EF 45-50%- 2020 No date: CKD (chronic kidney disease) stage 3, GFR 30-59 ml/min (HCC) No date: Clear cell renal cell carcinoma (HCC)     Comment:  s/p partial nephrectomy No date: COPD (chronic obstructive pulmonary disease) (HCC) No date: Coronary artery disease No date: Diabetes mellitus without complication (HCC) unk: Hypercholesteremia No date: Hypertension No date: Myocardial infarction ([place]) No date: Sarcoidosis No date: Sleep apnea     Comment:  does not use CPAP regularly. No date: Stroke [facility])  Past Surgical History: No date: APPENDECTOMY No date: PARTIAL NEPHRECTOMY  BMI    Body Mass Index: 35.46 kg/m      Reproductive/Obstetrics negative OB ROS                             Anesthesia Physical Anesthesia Plan  ASA: IV  Anesthesia Plan: General ETT   Post-op Pain Management:    Induction: Intravenous  PONV Risk Score and Plan: Ondansetron, Dexamethasone, Midazolam and Treatment may vary due to age or medical condition  Airway Management  Planned: Oral ETT  Additional Equipment:   Intra-op Plan:   Post-operative Plan: Post-operative intubation/ventilation  Informed Consent: I have reviewed the patients History and Physical, chart, labs and discussed the procedure including the risks, benefits and alternatives for the proposed anesthesia with the patient or authorized representative who has indicated his/her understanding and acceptance.     Dental Advisory Given  Plan Discussed with: Anesthesiologist, CRNA and Surgeon  Anesthesia Plan Comments: (History and phone consent from the patients wife Chai Routh at [phone]   Wife consented for risks of anesthesia including but not limited to:  - adverse reactions to medications - damage to eyes, teeth, lips or other oral mucosa - nerve damage due to positioning  - sore throat or hoarseness - Damage to heart, brain, nerves, lungs, other parts of body or loss of life  She voiced understanding.)        Anesthesia Quick Evaluation

## 2020-01-13 NOTE — Progress Notes (Signed)
Palliative: Conference with CCM attending and bedside nursing staff.  The plan is for [name] to have trach on 12/8, then transfer to [place].  [name] has had trach/PEG/LTAC in the past, and his wife is comfortable with this choice.  They would like time for improvements.  Plan: Full scope/full code, trach 12/9 then transferred to [facility].  No charge Quinn Axe, NP Palliative medicine team Team time [phone] Greater than 50% of this time was spent counseling and coordinating care related to the above assessment and plan.

## 2020-01-13 NOTE — Consult Note (Addendum)
ANTICOAGULATION CONSULT NOTE  Pharmacy Consult for Heparin Infusion Indication: atrial fibrillation  Patient Measurements: Heparin Dosing Weight: 104 kg  Labs: Recent Labs    01/11/20 0550 01/12/20 0615 01/12/20 1156 01/12/20 1156 01/13/20 0552 01/13/20 1320 01/13/20 2059  HGB  --  12.0*  --   --  13.4  --   --   HCT  --  39.4  --   --  43.1  --   --   PLT  --  144*  --   --  171  --   --   APTT  --   --  35   < > 60* 61* 80*  LABPROT  --   --  15.3*  --   --   --   --   INR  --   --  1.3*  --   --   --   --   HEPARINUNFRC  --   --  1.64*  --   --   --   --   CREATININE 1.54* 1.58*  --   --  1.39*  --   --    < > = values in this interval not displayed.    Estimated Creatinine Clearance: 87.9 mL/min (A) (by C-G formula based on SCr of 1.39 mg/dL (H)).   Medical History: Past Medical History:  Diagnosis Date  . CHF (congestive heart failure) (HCC)    EF 45-50%- 2020  . CKD (chronic kidney disease) stage 3, GFR 30-59 ml/min (HCC)   . Clear cell renal cell carcinoma (HCC)    s/p partial nephrectomy  . COPD (chronic obstructive pulmonary disease) ([place])   . Coronary artery disease   . Diabetes mellitus without complication ([place])   . Hypercholesteremia unk  . Hypertension   . Myocardial infarction ([place])   . Sarcoidosis   . Sleep apnea    does not use CPAP regularly.  . Stroke [facility])     Medications:  Apixaban 5 mg BID (last dose 12/7 at 0820)  Assessment: Patient is a 49 y/o M with medical history as above and including atrial fibrillation on apixaban who is admitted with respiratory failure requiring intubation and mechanical ventilation. Plan is for tracheostomy and apixaban has been put on hold pending procedure. Pharmacy has been consulted to initiate heparin infusion peri-operatively.   Baseline aPTT 35s, INR 1.3, and anti-Xa level 1.64  CBC monitoring: Hgb 12 >> 13.4 Plt 144 >> 171  Heparin course: 12/7 at 2039 heparin drip started at 1450  units/hr 12/8 at 0552 aPTT = 60s. Heparin drip increased to 1600 units/hr 12/8 at 1320 aPTT = 61s. Heparin drip increased to 1700 units/hr 12/8 at 2059 aPTT = 80s. Therapeutic.  Goal of Therapy:  aPTT 66 - 102 seconds Monitor platelets by anticoagulation protocol: Yes   Plan:  --Continue heparin infusion to 1700 units/hr, s/p rate increase, and will stop the infusion at 0000 on 12/9 for anticipated tracheostomy on 12/9 - nursing informed of heparin plan in preparation for the procedure. Will f/u tomorrow on heparin plan following the aforementioned procedure.  --Daily CBC per protocol while on heparin infusion --Discontinue heparin drip 12/9 at 0000 in anticipation of tracheostomy 12/9  Rowland Lathe 01/13/2020,9:44 PM

## 2020-01-13 NOTE — Progress Notes (Addendum)
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  11/27 ICU admission, INTUBATED #[id number] 11/28 severe resp failure, progressive Renal failure, Nephrology consulted 11/29 - patient passed SBT today, able to communicate/follow directions while intubated status post extubation to BIPAP - after 1 hour on BIPAP patient became unresponsive and required re-intubation. I have met with mother in law at bedside and reviewed careplan.  11/30- patient remains on ventilator. He has thickened mucopurulent tracheal secretions, he can follow verbal instructions but failed SBT today. GPC+ tracheal aspirate culture. 01/06/20-SBT for extubation, failed with heavy secretions. TOC consult today.  01/07/20-patient failed SBT after 10 minutes with acute hypoxemia. PALS consult for VDRF. Spoke with Wife Patrice about trache she wants to wait for several days 01/08/20:Palliative care discussing with family, likelihood patient will remain ventilator dependent, continues to fail SBT's has had prior episode of trach and LTACH stay. 01/10/2020:Tolerating SBT's better on current sedation Precedex/fentanyl-calm, communicates with ICU communication board tool 12/6 severe end stage COPD, will need trach, wife updated    CC  follow up respiratory failure  SUBJECTIVE Patient remains critically ill Prognosis is guarded   BP 105/69   Pulse 73   Temp 99.1 F (37.3 C)   Resp 20   Ht 6' 0.99" (1.854 m)   Wt 121.9 kg   SpO2 96%   BMI 35.46 kg/m    I/O last 3 completed shifts: In: 4002.6 [I.V.:2486.6; NG/GT:1416; IV  Piggyback:100] Out: 2950 [Urine:2950] No intake/output data recorded.  SpO2: 96 % O2 Flow Rate (L/min): 3 L/min FiO2 (%): 30 %  Estimated body mass index is 35.46 kg/m as calculated from the following:   Height as of this encounter: 6' 0.99" (1.854 m).   Weight as of this encounter: 121.9 kg.  SIGNIFICANT EVENTS   REVIEW OF SYSTEMS  PATIENT IS UNABLE TO PROVIDE COMPLETE REVIEW OF SYSTEMS DUE TO SEVERE CRITICAL ILLNESS        PHYSICAL EXAMINATION:  GENERAL:critically ill appearing, +resp distress HEAD: Normocephalic, atraumatic.  EYES: Pupils equal, round, reactive to light.  No scleral icterus.  MOUTH: Moist mucosal membrane. NECK: Supple.  PULMONARY: +rhonchi, +wheezing CARDIOVASCULAR: S1 and S2. Regular rate and rhythm. No murmurs, rubs, or gallops.  GASTROINTESTINAL: Soft, nontender, -distended.  Positive bowel sounds.   MUSCULOSKELETAL: No swelling, clubbing, or edema.  NEUROLOGIC: obtunded, GCS<8 SKIN:intact,warm,dry  MEDICATIONS: I have reviewed all medications and confirmed regimen as documented   CULTURE RESULTS   Recent Results (from the past 240 hour(s))  Culture, respiratory     Status: None   Collection Time: 01/04/20 11:14 AM   Specimen: Tracheal Aspirate; Respiratory  Result Value Ref Range Status   Specimen Description   Final    TRACHEAL ASPIRATE Performed at [facility], [address] [place], [place] [address]    Special Requests   Final    NONE Performed at [facility], [address] [place], [place] [address]    Gram Stain   Final    FEW WBC PRESENT, PREDOMINANTLY PMN NO ORGANISMS SEEN    Culture   Final    NO GROWTH 2 DAYS Performed at [facility] [address], [place], [place] [address]  Report Status 01/07/2020 FINAL  Final  Culture, respiratory (non-expectorated)     Status: None   Collection Time: 01/09/20  7:55 PM   Specimen: Tracheal Aspirate; Respiratory  Result Value Ref Range Status    Specimen Description   Final    TRACHEAL ASPIRATE Performed at [facility], [address], [place], [place] [address]    Special Requests   Final    NONE Performed at [facility], [address] [place], [place] [address]    Gram Stain   Final    RARE WBC PRESENT,BOTH PMN AND MONONUCLEAR RARE GRAM POSITIVE COCCI IN PAIRS IN CHAINS RARE GRAM NEGATIVE RODS    Culture   Final    FEW Normal respiratory flora-no Staph aureus or Pseudomonas seen Performed at [facility], [place] [address], [place], [place] [address]    Report Status 01/12/2020 FINAL  Final          IMAGING    No results found.   Nutrition Status: Nutrition Problem: Inadequate oral intake Etiology: inability to eat Signs/Symptoms: NPO status Interventions: Tube feeding, Prostat, MVI  BMP Latest Ref Rng & Units 01/13/2020 01/12/2020 01/11/2020  Glucose 70 - 99 mg/dL 143(H) 158(H) 128(H)  BUN 6 - 20 mg/dL 53(H) 55(H) 47(H)  Creatinine 0.61 - 1.24 mg/dL 1.39(H) 1.58(H) 1.54(H)  Sodium 135 - 145 mmol/L 144 143 146(H)  Potassium 3.5 - 5.1 mmol/L 4.9 4.6 4.8  Chloride 98 - 111 mmol/L 110 109 109  CO2 22 - 32 mmol/L 26 29 29   Calcium 8.9 - 10.3 mg/dL 8.7(L) 8.5(L) 8.8(L)      Indwelling Urinary Catheter continued, requirement due to   Reason to continue Indwelling Urinary Catheter strict Intake/Output monitoring for hemodynamic instability         Ventilator continued, requirement due to severe respiratory failure   Ventilator Sedation RASS 0 to -2      ASSESSMENT AND PLAN SYNOPSIS 49 yo AAM with severe morbid obesity with biventricular heart failure with acute on chronic resp failue with hypoxia and hypercapnia due to acute COPD exacerbation with acute CAP with sepsis, present on admission, with severe metabolic encephalopathy from hypoxia and hypercapnia with progressive renal failure. Required intubation. Failed trial of extubation, needs trach to survive  Plan for  Trach tomorrow by ENT transitioned off oral AC On heparin infusion  Severe ACUTE Hypoxic and Hypercapnic Respiratory Failure -continue Full MV support -continue Bronchodilator Therapy -Wean Fio2 and PEEP as tolerated -VAP/VENT bundle implementation  ACUTE DIASTOLIC CARDIAC FAILURE-  -oxygen as needed -Lasix as tolerated -follow up cardiac enzymes as indicated    Morbid obesity, possible OSA.   Will certainly impact respiratory mechanics, ventilator weaning Suspect will need to consider additional PEEP   ACUTE KIDNEY INJURY/Renal Failure -continue Foley Catheter-assess need -Avoid nephrotoxic agents -Follow urine output, BMP -Ensure adequate renal perfusion, optimize oxygenation -Renal dose medications     NEUROLOGY - intubated and sedated - minimal sedation to achieve a RASS goal: -1   CARDIAC ICU monitoring   GI GI PROPHYLAXIS as indicated  NUTRITIONAL STATUS Nutrition Status: Nutrition Problem: Inadequate oral intake Etiology: inability to eat Signs/Symptoms: NPO status Interventions: Tube feeding, Prostat, MVI   DIET-->TF's as tolerated Constipation protocol as indicated  ENDO - will use ICU hypoglycemic\Hyperglycemia protocol if indicated     ELECTROLYTES -follow labs as needed -replace as needed -pharmacy consultation and following   DVT/GI PRX ordered and assessed TRANSFUSIONS AS NEEDED MONITOR FSBS I Assessed the need for Labs  I Assessed the need for Foley I Assessed the need for Central Venous Line Family Discussion when available I Assessed the need for Mobilization I made an Assessment of medications to be adjusted accordingly Safety Risk assessment completed   CASE DISCUSSED IN MULTIDISCIPLINARY ROUNDS WITH ICU TEAM  Critical Care Time devoted to patient care services described in this note is 39 minutes.   Overall, patient is critically ill, prognosis is guarded.  Patient with Multiorgan failure and at high risk for cardiac  arrest and death.    Corrin Parker, M.D.  [facility]  Medical Director [place] Director [facility] Cardio-Pulmonary Department

## 2020-01-14 ENCOUNTER — Encounter: Payer: Self-pay | Admitting: Otolaryngology

## 2020-01-14 ENCOUNTER — Inpatient Hospital Stay: Payer: Medicare HMO | Admitting: Anesthesiology

## 2020-01-14 ENCOUNTER — Encounter
Admission: EM | Disposition: A | Discharge status: Nursing Home (Includes ICF) | Payer: Self-pay | Source: Home / Self Care | Attending: Internal Medicine

## 2020-01-14 ENCOUNTER — Inpatient Hospital Stay: Payer: Medicare HMO

## 2020-01-14 HISTORY — PX: TRACHEOSTOMY TUBE PLACEMENT: SHX814

## 2020-01-14 LAB — CBC
HCT: 38.8 % — ABNORMAL LOW (ref 39.0–52.0)
Hemoglobin: 12.1 g/dL — ABNORMAL LOW (ref 13.0–17.0)
MCH: 28.6 pg (ref 26.0–34.0)
MCHC: 31.2 g/dL (ref 30.0–36.0)
MCV: 91.7 fL (ref 80.0–100.0)
Platelets: 170 10*3/uL (ref 150–400)
RBC: 4.23 MIL/uL (ref 4.22–5.81)
RDW: 13 % (ref 11.5–15.5)
WBC: 6.9 10*3/uL (ref 4.0–10.5)
nRBC: 0 % (ref 0.0–0.2)

## 2020-01-14 LAB — GLUCOSE, CAPILLARY
Glucose-Capillary: 104 mg/dL — ABNORMAL HIGH (ref 70–99)
Glucose-Capillary: 126 mg/dL — ABNORMAL HIGH (ref 70–99)
Glucose-Capillary: 155 mg/dL — ABNORMAL HIGH (ref 70–99)
Glucose-Capillary: 142 mg/dL — ABNORMAL HIGH (ref 70–99)
Glucose-Capillary: 161 mg/dL — ABNORMAL HIGH (ref 70–99)

## 2020-01-14 SURGERY — CREATION, TRACHEOSTOMY
Anesthesia: General | Site: Neck

## 2020-01-14 MED ORDER — VASOPRESSIN 20 UNIT/ML IV SOLN
INTRAVENOUS | Status: DC | PRN
  Administered 2020-01-14 (×3): 2 [IU] via INTRAVENOUS

## 2020-01-14 MED ORDER — FENTANYL CITRATE (PF) 100 MCG/2ML IJ SOLN
INTRAMUSCULAR | Status: AC
  Filled 2020-01-14: qty 2

## 2020-01-14 MED ORDER — SODIUM CHLORIDE 0.9 % IV SOLN
INTRAVENOUS | Status: DC | PRN
  Administered 2020-01-14: 6 mL

## 2020-01-14 MED ORDER — ROCURONIUM BROMIDE 100 MG/10ML IV SOLN
INTRAVENOUS | Status: DC | PRN
  Administered 2020-01-14 (×2): 50 mg via INTRAVENOUS

## 2020-01-14 MED ORDER — GLYCOPYRROLATE 0.2 MG/ML IJ SOLN
INTRAMUSCULAR | Status: DC | PRN
  Administered 2020-01-14: .2 mg via INTRAVENOUS

## 2020-01-14 MED ORDER — MIDAZOLAM HCL 2 MG/2ML IJ SOLN
INTRAMUSCULAR | Status: AC
  Filled 2020-01-14: qty 2

## 2020-01-14 MED ORDER — FENTANYL CITRATE (PF) 100 MCG/2ML IJ SOLN
INTRAMUSCULAR | Status: DC | PRN
  Administered 2020-01-14 (×2): 50 ug via INTRAVENOUS

## 2020-01-14 MED ORDER — DEXAMETHASONE SODIUM PHOSPHATE 10 MG/ML IJ SOLN
INTRAMUSCULAR | Status: DC | PRN
  Administered 2020-01-14: 10 mg via INTRAVENOUS

## 2020-01-14 MED ORDER — PHENYLEPHRINE HCL (PRESSORS) 10 MG/ML IV SOLN
INTRAVENOUS | Status: DC | PRN
  Administered 2020-01-14: 200 ug via INTRAVENOUS
  Administered 2020-01-14: 100 ug via INTRAVENOUS
  Administered 2020-01-14: 200 ug via INTRAVENOUS

## 2020-01-14 MED ORDER — PROPOFOL 10 MG/ML IV BOLUS
INTRAVENOUS | Status: DC | PRN
  Administered 2020-01-14: 50 mg via INTRAVENOUS

## 2020-01-14 MED ORDER — PROPOFOL 10 MG/ML IV BOLUS
INTRAVENOUS | Status: AC
  Filled 2020-01-14: qty 20

## 2020-01-14 MED ORDER — EPHEDRINE SULFATE 50 MG/ML IJ SOLN
INTRAMUSCULAR | Status: DC | PRN
  Administered 2020-01-14: 5 mg via INTRAVENOUS

## 2020-01-14 SURGICAL SUPPLY — 29 items
BLADE SURG 15 STRL LF DISP TIS (BLADE) ×1 IMPLANT
BLADE SURG 15 STRL SS (BLADE) ×3
BLADE SURG SZ11 CARB STEEL (BLADE) ×3 IMPLANT
CANISTER SUCT 1200ML W/VALVE (MISCELLANEOUS) ×3 IMPLANT
COVER WAND RF STERILE (DRAPES) ×3 IMPLANT
ELECT REM PT RETURN 9FT ADLT (ELECTROSURGICAL) ×3
ELECTRODE REM PT RTRN 9FT ADLT (ELECTROSURGICAL) ×1 IMPLANT
GAUZE PACKING IODOFORM 1/2 (PACKING) IMPLANT
GLOVE BIO SURGEON STRL SZ7.5 (GLOVE) ×3 IMPLANT
GOWN STRL REUS W/ TWL LRG LVL3 (GOWN DISPOSABLE) ×2 IMPLANT
GOWN STRL REUS W/TWL LRG LVL3 (GOWN DISPOSABLE) ×6
HEMOSTAT SURGICEL 2X3 (HEMOSTASIS) IMPLANT
HLDR TRACH TUBE NECKBAND 18 (MISCELLANEOUS) ×1 IMPLANT
HOLDER TRACH TUBE NECKBAND 18 (MISCELLANEOUS) ×3
KIT TURNOVER KIT A (KITS) ×3 IMPLANT
LABEL OR SOLS (LABEL) ×3 IMPLANT
MANIFOLD NEPTUNE II (INSTRUMENTS) ×3 IMPLANT
NS IRRIG 500ML POUR BTL (IV SOLUTION) ×3 IMPLANT
PACK HEAD/NECK (MISCELLANEOUS) ×3 IMPLANT
SHEARS HARMONIC 9CM CVD (BLADE) ×3 IMPLANT
SPONGE DRAIN TRACH 4X4 STRL 2S (GAUZE/BANDAGES/DRESSINGS) ×3 IMPLANT
SPONGE KITTNER 5P (MISCELLANEOUS) ×3 IMPLANT
SUCTION FRAZIER HANDLE 10FR (MISCELLANEOUS) ×3
SUCTION TUBE FRAZIER 10FR DISP (MISCELLANEOUS) IMPLANT
SUT ETHILON 2 0 FS 18 (SUTURE) ×3 IMPLANT
SUT SILK 2 0 SH (SUTURE) IMPLANT
SUT VIC AB 3-0 PS2 18 (SUTURE) IMPLANT
TUBE TRACH SHILEY  6 DIST  CUF (TUBING) IMPLANT
TUBE TRACH SHILEY 8 DIST CUF (TUBING) ×2 IMPLANT

## 2020-01-14 NOTE — Op Note (Signed)
01/14/2020  8:25 AM    Kate Sable  [id number]   Pre-Op Diagnosis:  respiratory failure  Post-op Diagnosis: respiratory failure  Procedure: Elective Tracheostomy (revision)  Surgeon:  Riley Nearing  Assistant: Carloyn Manner  Anesthesia:  General endotracheal anesthesia  EBL:  Less than 94WN  Complications:  None  Findings: Extensive scar tissue and dense cartilage growth anterior tracheal wall from prior tracheostomy  Procedure: The patient was taken to the Operating Room from the CCU, already intubated, and placed in the supine position.  After induction of general anesthesia, the patient was placed on a shoulder roll with the neck extended. The skin was injected along the proposed incision line over the trachea with 1% lidocaine with epinephrine, 1:100,000. The area was then prepped and draped in the usual sterile fashion.  A 15 blade was then used to incise the skin in a horizontal incision over the trachea, excising prior scar from tracheostomy in an ellipse. The dissection was carried down to the subcutaneous tissues and through the platysma with the Bovie.  The strap muscles were divided in the midline and retracted laterally. The thyroid isthmus was exposed and divided in the midline over the trachea and dissected away from the anterior aspect of the trachea with the Bovie. With hemostasis obtained, the anesthesiologist was alerted that the airway was about to be entered so that the oxygen concentration could be lowered to reduce fire risk. The scrub tech prepared the tracheostomy tube, confirming no leak in the balloon cuff. The trachea was retracted superiorly with a crich hook, then incised between the 2nd and third tracheal ring region, which was heavily scarred. The tracheal cartilage was thicker here and appeared somewhat collapsed inward with scarring from prior surgery. Once the lumen was entered, the dense, thickened part of the anterior tracheal wall was excised  with scissors, creating an adequate tracheal window for the tube. The airway was suctioned and, after the anesthesiologist pulled the endotracheal tube back,  a #8 [place] cuffed tracheostomy tube was inserted into the tracheal lumen. The inner cannula was placed, the cuff inflated,  and the patient hooked to the anesthesia circuit for ventilation. CO2 return and adequate ventilation was confirmed with the anesthesiologist. Hemostasis was confirmed and the flange of the tracheostomy tube was sutured to the skin with 4-0 Vicryl suture. A trach tie was placed around the neck to further secure the tracheostomy tube. Betadine soaked gauze was placed around the wound.  The patient was then returned to the anesthesiologist and taken to the CCU in stable condition.  Disposition:   Return to the CCU  Plan: Routine trach care and suctioning each shift and PRN. Vent settings per CCU admitting physician. Sutures can be removed in a week.  Riley Nearing 01/14/2020 8:25 AM

## 2020-01-14 NOTE — Progress Notes (Signed)
CRITICAL CARE NOTE 49 y.o.male.with medical history significant ofchronic combined systolic and diastolic CHF, COPD, CAD, history of MI, type 2 diabetes, hyperlipidemia, hypertension, sarcoidosis, sleep apnea not using Trilogy vent regularly, he has chronic hypercapnic respiratory failure due to end-stage lung disease.Previous h/o [facility] AND LTACH, admitted with acute on chronic mixed respiratory failure requiring intubation PCCM consulted.  11/27 ICU admission, INTUBATED #[id number] 11/28 severe resp failure, progressive Renal failure, Nephrology consulted 11/29 - patient passed SBT today, able to communicate/follow directions while intubated status post extubation to BIPAP - after 1 hour on BIPAP patient became unresponsive and required re-intubation. I have met with mother in law at bedside and reviewed careplan.  11/30- patient remains on ventilator. He has thickened mucopurulent tracheal secretions, he can follow verbal instructions but failed SBT today. GPC+ tracheal aspirate culture. 01/06/20-SBT for extubation, failed with heavy secretions. TOC consult today.  01/07/20-patient failed SBT after 10 minutes with acute hypoxemia. PALS consult for VDRF. Spoke with Wife Patrice about trache she wants to wait for several days 01/08/20:Palliative care discussing with family, likelihood patient will remain ventilator dependent, continues to fail SBT's has had prior episode of trach and LTACH stay. 01/10/2020:Tolerating SBT's better on current sedation Precedex/fentanyl-calm, communicates with ICU communication board tool 12/6 severe end stage COPD, will need trach, wife updated 12/7-12/8 failure to wean from vent 12/9 s/p trach    CC  follow up respiratory failure  SUBJECTIVE Patient remains critically ill Prognosis is guarded S/p trach  BP 105/69   Pulse 66   Temp 98.24 F (36.8 C)   Resp 20   Ht 6' 0.99" (1.854 m)   Wt 123.4 kg   SpO2 100%   BMI 35.90 kg/m    I/O last 3  completed shifts: In: 7931.1 [I.V.:2566.1; NG/GT:5215; IV Piggyback:150] Out: 3275 [Urine:3275] Total I/O In: 800 [I.V.:800] Out: 800 [Other:800]  SpO2: 100 % O2 Flow Rate (L/min): 3 L/min FiO2 (%): 30 %  Estimated body mass index is 35.9 kg/m as calculated from the following:   Height as of this encounter: 6' 0.99" (1.854 m).   Weight as of this encounter: 123.4 kg.  SIGNIFICANT EVENTS   REVIEW OF SYSTEMS  PATIENT IS UNABLE TO PROVIDE COMPLETE REVIEW OF SYSTEMS DUE TO SEVERE CRITICAL ILLNESS        PHYSICAL EXAMINATION:  GENERAL:critically ill appearing, +resp distress HEAD: Normocephalic, atraumatic.  EYES: Pupils equal, round, reactive to light.  No scleral icterus.  MOUTH: Moist mucosal membrane. NECK: s/p trach PULMONARY: +rhonchi, +wheezing CARDIOVASCULAR: S1 and S2. Regular rate and rhythm. No murmurs, rubs, or gallops.  GASTROINTESTINAL: Soft, nontender, -distended.  Positive bowel sounds.   MUSCULOSKELETAL: No swelling, clubbing, or edema.  NEUROLOGIC: obtunded, GCS<8 SKIN:intact,warm,dry  MEDICATIONS: I have reviewed all medications and confirmed regimen as documented   CULTURE RESULTS   Recent Results (from the past 240 hour(s))  Culture, respiratory     Status: None   Collection Time: 01/04/20 11:14 AM   Specimen: Tracheal Aspirate; Respiratory  Result Value Ref Range Status   Specimen Description   Final    TRACHEAL ASPIRATE Performed at [facility], [address] [place], [place] [address]    Special Requests   Final    NONE Performed at [facility], [address] [place], [place] [address]    Gram Stain   Final    FEW WBC PRESENT, PREDOMINANTLY PMN NO ORGANISMS SEEN    Culture   Final    NO GROWTH 2 DAYS Performed at  [facility], [place] [address], [place], [place] [address]    Report Status 01/07/2020 FINAL  Final  Culture, respiratory (non-expectorated)     Status: None   Collection Time: 01/09/20   7:55 PM   Specimen: Tracheal Aspirate; Respiratory  Result Value Ref Range Status   Specimen Description   Final    TRACHEAL ASPIRATE Performed at [facility], [address] [place], [place] [address]    Special Requests   Final    NONE Performed at [facility], [address] [place], [place] [address]    Gram Stain   Final    RARE WBC PRESENT,BOTH PMN AND MONONUCLEAR RARE GRAM POSITIVE COCCI IN PAIRS IN CHAINS RARE GRAM NEGATIVE RODS    Culture   Final    FEW Normal respiratory flora-no Staph aureus or Pseudomonas seen Performed at [facility], [place] [address], [place], [place] [address]    Report Status 01/12/2020 FINAL  Final        Antibiotics Given (last 72 hours)    None        Indwelling Urinary Catheter continued, requirement due to   Reason to continue Indwelling Urinary Catheter strict Intake/Output monitoring for hemodynamic instability         Ventilator continued, requirement due to severe respiratory failure   Ventilator Sedation RASS 0 to -2      ASSESSMENT AND PLAN SYNOPSIS 49 yo AAM with severe morbid obesity with biventricular heart failure with acute on chronic resp failue with hypoxia and hypercapnia due to acute COPD exacerbation with acute CAP with sepsis, present on admission, with severe metabolic encephalopathy from hypoxia and hypercapnia with progressive renal failure. Required intubation. Failed trial of extubation, needs trach to survive-indefinately  Severe ACUTE Hypoxic and Hypercapnic Respiratory Failure -continue Full MV support -continue Bronchodilator Therapy -Wean Fio2 and PEEP as tolerated -will perform SAT/SBT when respiratory parameters are met -VAP/VENT bundle implementation  ACUTE DIASTOLIC CARDIAC FAILURE- -oxygen as needed -Lasix as tolerated   Morbid obesity, possible OSA.   trach will take of OSA    NEUROLOGY - intubated and sedated - minimal sedation to achieve a RASS goal:  -1 Wake up assessment pending  CARDIAC ICU monitoring  GI GI PROPHYLAXIS as indicated  NUTRITIONAL STATUS Nutrition Status: Nutrition Problem: Inadequate oral intake Etiology: inability to eat Signs/Symptoms: NPO status Interventions: Tube feeding,Prostat,MVI   DIET-->TF's as tolerated Constipation protocol as indicated  ENDO - will use ICU hypoglycemic\Hyperglycemia protocol if indicated     ELECTROLYTES -follow labs as needed -replace as needed -pharmacy consultation and following   DVT/GI PRX ordered and assessed TRANSFUSIONS AS NEEDED MONITOR FSBS I Assessed the need for Labs I Assessed the need for Foley I Assessed the need for Central Venous Line Family Discussion when available I Assessed the need for Mobilization I made an Assessment of medications to be adjusted accordingly Safety Risk assessment completed   CASE DISCUSSED IN MULTIDISCIPLINARY ROUNDS WITH ICU TEAM  Critical Care Time devoted to patient care services described in this note is 45 minutes.   Overall, patient is critically ill, prognosis is guarded.   Plan for LTACH transfer   Corrin Parker, M.D.  [facility]  Medical Director [place] Director [facility] Cardio-Pulmonary Department

## 2020-01-14 NOTE — Transfer of Care (Signed)
Immediate Anesthesia Transfer of Care Note  Patient: [name]  Procedure(s) Performed: TRACHEOSTOMY (N/A Neck)  Patient Location: ICU  Anesthesia Type:General  Level of Consciousness: sedated and Patient remains intubated per anesthesia plan  Airway & Oxygen Therapy: Patient connected to tracheostomy mask oxygen and Patient remains intubated per anesthesia plan  Post-op Assessment: Report given to RN and Post -op Vital signs reviewed and stable  Post vital signs: Reviewed and stable  Last Vitals:  Vitals Value Taken Time  BP    Temp    Pulse    Resp    SpO2      Last Pain:  Vitals:   01/14/20 0000  TempSrc: Bladder  PainSc:          Complications: No complications documented.

## 2020-01-14 NOTE — TOC Progression Note (Signed)
Transition of Care [facility]) - Progression Note    Patient Details  Name: [name] MRN: [id number] Date of Birth: 07/20/1970  Transition of Care [facility]) CM/SW [place], [place] Phone Number: [phone] 01/14/2020, 4:09 PM  Clinical Narrative:     Insurance authorization started for [facility] placement.  CSW updated patient's spouse [employer].   Expected Discharge Plan: Long Term Acute Care (LTAC) Barriers to Discharge: Continued Medical Work up  Expected Discharge Plan and Services Expected Discharge Plan: Long Term Acute Care (LTAC) In-house Referral: Clinical Social Work   Post Acute Care Choice: Long Term Acute Care (LTAC) Living arrangements for the past 2 months: Single Family Home                                       Social Determinants of Health (SDOH) Interventions    Readmission Risk Interventions Readmission Risk Prevention Plan 11/19/2018 07/30/2018 07/29/2018  Transportation Screening Complete Complete Complete  PCP or Specialist Appt within 3-5 Days - Complete Complete  HRI or [place] - Complete Complete  Social Work Consult for [place] Planning/Counseling - - Patient refused  Palliative Care Screening - Not Applicable Not Applicable  Medication Review Press photographer) Referral to Pharmacy Complete Complete  PCP or Specialist appointment within 3-5 days of discharge Complete - -  [place] or Home Care Consult Patient refused - -  SW Recovery Care/Counseling Consult Complete - -  Palliative Care Screening Not Applicable - -  [place] Not Applicable - -  Some recent data might be hidden

## 2020-01-14 NOTE — Anesthesia Postprocedure Evaluation (Signed)
Anesthesia Post Note  Patient: [name]  Procedure(s) Performed: TRACHEOSTOMY (N/A Neck)  Patient location during evaluation: ICU Anesthesia Type: General Level of consciousness: sedated Pain management: pain level controlled Vital Signs Assessment: post-procedure vital signs reviewed and stable Respiratory status: patient remains intubated per anesthesia plan Cardiovascular status: stable Postop Assessment: no apparent nausea or vomiting Anesthetic complications: no Comments: Direct to ICU with trach intact   No complications documented.   Last Vitals:  Vitals:   01/14/20 0842 01/14/20 0900  BP:  122/76  Pulse:  83  Resp:  20  Temp:  36.9 C  SpO2: 100% 94%    Last Pain:  Vitals:   01/14/20 0900  TempSrc: Bladder  PainSc:                  Alphonsus Sias

## 2020-01-14 NOTE — H&P (Signed)
History and physical reviewed and will be scanned in later. No change in medical status reported by the patient or family, appears stable for surgery. All questions regarding the procedure answered, and patient (or family if a child) expressed understanding of the procedure. ? ?[name] S [name] ?[email or web address]@ ?

## 2020-01-15 ENCOUNTER — Inpatient Hospital Stay: Payer: Medicare HMO

## 2020-01-15 LAB — CBC
HCT: 40.8 % (ref 39.0–52.0)
Hemoglobin: 12.9 g/dL — ABNORMAL LOW (ref 13.0–17.0)
MCH: 28.6 pg (ref 26.0–34.0)
MCHC: 31.6 g/dL (ref 30.0–36.0)
MCV: 90.5 fL (ref 80.0–100.0)
Platelets: 195 10*3/uL (ref 150–400)
RBC: 4.51 MIL/uL (ref 4.22–5.81)
RDW: 12.6 % (ref 11.5–15.5)
WBC: 8.5 10*3/uL (ref 4.0–10.5)
nRBC: 0 % (ref 0.0–0.2)

## 2020-01-15 LAB — GLUCOSE, CAPILLARY
Glucose-Capillary: 121 mg/dL — ABNORMAL HIGH (ref 70–99)
Glucose-Capillary: 128 mg/dL — ABNORMAL HIGH (ref 70–99)
Glucose-Capillary: 134 mg/dL — ABNORMAL HIGH (ref 70–99)
Glucose-Capillary: 136 mg/dL — ABNORMAL HIGH (ref 70–99)
Glucose-Capillary: 155 mg/dL — ABNORMAL HIGH (ref 70–99)
Glucose-Capillary: 164 mg/dL — ABNORMAL HIGH (ref 70–99)

## 2020-01-15 LAB — BASIC METABOLIC PANEL
Anion gap: 7 (ref 5–15)
BUN: 48 mg/dL — ABNORMAL HIGH (ref 6–20)
CO2: 28 mmol/L (ref 22–32)
Calcium: 8.7 mg/dL — ABNORMAL LOW (ref 8.9–10.3)
Chloride: 109 mmol/L (ref 98–111)
Creatinine, Ser: 1.21 mg/dL (ref 0.61–1.24)
GFR, Estimated: >60 mL/min (ref >60)
Glucose, Bld: 154 mg/dL — ABNORMAL HIGH (ref 70–99)
Potassium: 5.3 mmol/L — ABNORMAL HIGH (ref 3.5–5.1)
Sodium: 144 mmol/L (ref 135–145)

## 2020-01-15 LAB — POTASSIUM: Potassium: 4.9 mmol/L (ref 3.5–5.1)

## 2020-01-15 MED ORDER — DEXTROSE 50 % IV SOLN
1.0000 | Freq: Once | INTRAVENOUS | Status: AC
  Administered 2020-01-15: 50 mL via INTRAVENOUS
  Filled 2020-01-15: qty 50

## 2020-01-15 MED ORDER — FAMOTIDINE 40 MG/5ML PO SUSR
20.0000 mg | Freq: Two times a day (BID) | ORAL | Status: DC
  Administered 2020-01-15 – 2020-01-17 (×4): 20 mg
  Filled 2020-01-15 (×8): qty 2.5

## 2020-01-15 MED ORDER — SODIUM ZIRCONIUM CYCLOSILICATE 5 G PO PACK: 5.0000 g | PACK | Freq: Once | ORAL | Status: DC

## 2020-01-15 MED ORDER — INSULIN ASPART 100 UNIT/ML IV SOLN
10.0000 [IU] | Freq: Once | INTRAVENOUS | Status: AC
  Administered 2020-01-15: 10 [IU] via INTRAVENOUS
  Filled 2020-01-15: qty 0.1

## 2020-01-15 MED ORDER — APIXABAN 5 MG PO TABS
5.0000 mg | ORAL_TABLET | Freq: Two times a day (BID) | ORAL | Status: DC
  Administered 2020-01-15 – 2020-01-17 (×4): 5 mg
  Filled 2020-01-15 (×4): qty 1

## 2020-01-15 NOTE — Progress Notes (Addendum)
PHARMACIST - PHYSICIAN COMMUNICATION  CONCERNING: IV to Oral Route Change Policy  RECOMMENDATION: This patient is receiving famotidine by the intravenous route.  Based on criteria approved by the Pharmacy and Therapeutics Committee, the intravenous medication(s) is/are being converted to the equivalent oral dose form(s).   DESCRIPTION: These criteria include:  The patient is eating (either orally or via tube) and/or has been taking other orally administered medications for a least 24 hours  The patient has no evidence of active gastrointestinal bleeding or impaired GI absorption (gastrectomy, short bowel, patient on TNA or NPO).  If you have questions about this conversion, please contact the Pharmacy Department  [x]   [phone] )  [place]  01/15/20

## 2020-01-15 NOTE — TOC Progression Note (Signed)
Transition of Care [facility]) - Progression Note    Patient Details  Name: [name] MRN: [id number] Date of Birth: 21-Dec-1970  Transition of Care [facility]) CM/SW [place], [place] Phone Number: [phone] 01/15/2020, 1:37 PM  Clinical Narrative:     CSW received call from [place] at [facility] for insurance auth update.  Patient's insurance [employer] will not approve move to [facility] until the patient has been on the vent for 21 days. CSW updated patient's spouse [employer].    Expected Discharge Plan: Long Term Acute Care (LTAC) Barriers to Discharge: Continued Medical Work up  Expected Discharge Plan and Services Expected Discharge Plan: Long Term Acute Care (LTAC) In-house Referral: Clinical Social Work   Post Acute Care Choice: Long Term Acute Care (LTAC) Living arrangements for the past 2 months: Single Family Home                                       Social Determinants of Health (SDOH) Interventions    Readmission Risk Interventions Readmission Risk Prevention Plan 11/19/2018 07/30/2018 07/29/2018  Transportation Screening Complete Complete Complete  PCP or Specialist Appt within 3-5 Days - Complete Complete  HRI or [place] - Complete Complete  Social Work Consult for [place] Planning/Counseling - - Patient refused  Palliative Care Screening - Not Applicable Not Applicable  Medication Review Press photographer) Referral to Pharmacy Complete Complete  PCP or Specialist appointment within 3-5 days of discharge Complete - -  [place] or Home Care Consult Patient refused - -  SW Recovery Care/Counseling Consult Complete - -  Palliative Care Screening Not Applicable - -  [place] Not Applicable - -  Some recent data might be hidden

## 2020-01-15 NOTE — Progress Notes (Signed)
NAME:  [name], MRN:  [id number], DOB:  July 20, 1970, LOS: 8 ADMISSION DATE:  01/01/2020, CONSULTATION DATE:  01/02/2020 REFERRING MD:  Dr. Vernetta Honey, CHIEF COMPLAINT:  Acute Hypoxic Hypercapnic Respiratory Failure   Brief History   49 y.o. Male admitted with Acute on Chronic Hypoxic Hypercapnic Respiratory Failure in the setting of COPD Exacerbation, CAP and sepsis, and Acute Decompensated biventricular heart failure.  Required intubation.  Failed multiple weans, required tracheostomy placement.  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   Past Medical History  COPD Sleep Apnea CHF CKD Stage III CAD MI Hypertension Stroke Sarcoidosis Diabetes Mellitus  Significant Hospital Events   11/27 ICU admission, INTUBATED #[id number] 11/28 severe resp failure, progressive Renal failure, Nephrology consulted 11/29 - patient passed SBT today, able to communicate/follow directions while intubated status post extubation to BIPAP - after 1 hour on BIPAP patient became unresponsive and required re-intubation. I have met with mother in law at bedside and reviewed careplan.  11/30- patient remains on ventilator. He has thickened mucopurulent tracheal secretions, he can follow verbal instructions but failed SBT today. GPC+ tracheal aspirate culture. 01/06/20-SBT for extubation, failed with heavy secretions. TOC consult today.  01/07/20-patient failed SBT after 10 minutes with acute hypoxemia. PALS consult for VDRF. Spoke with Wife Patrice about trache she wants to wait for several  days 01/08/20:Palliative care discussing with family, likelihood patient will remain ventilator dependent, continues to fail SBT's has had prior episode of trach and LTACH stay. 01/10/2020:Tolerating SBT's better on current sedation Precedex/fentanyl-calm, communicates with ICU communication board tool 12/6 severe end stage COPD, will need trach, wife updated 12/7-12/8 failure to wean from vent 12/9 s/p trach 12/10: In PS mode on vent, plan for trach collar trials as tolerated  Consults:  PCCM ENT Palliative Care Nephrology  Procedures:  11/27: Intubation 11/29: Reintubation  12/2: Reintubation 12/9: Tracheostomy placed  Significant Diagnostic Tests:  11/28: Renal US>>Kidneys appear unremarkable bilaterally. Urinary bladder decompressed with Foley catheter and cannot be assessed at this time.  Micro Data:  11/26: SARS-CoV-2 PCR>>negative 11/26:Influenza PCR>> negative 11/27: MRSA PCR>>negative 11/27: Sputum>>normal respiratory flora 11/28:Hepatitis panel>>nonreactive 11/29: Sputum>>no growth  Antimicrobials:  Levofloxacin 11/27>>12/3  Interim history/subjective:  Tracheostomy placed yesterday 01/14/20 Lightly sedated In PS mode on vent,  Hemodynamically stable, Afebrile  Objective   Blood pressure 104/69, pulse 76, temperature 97.88 F (36.6 C), resp. rate (!) 30, height 6' 0.99" (1.854 m), weight 122.8 kg, SpO2 96 %.    Vent Mode: PSV FiO2 (%):  [30 %] 30 % Set Rate:  [20 bmp] 20 bmp Vt Set:  [500 mL] 500 mL PEEP:  [5 cmH20] 5 cmH20 Pressure Support:  [12 cmH20] 12 cmH20   Intake/Output Summary (Last 24 hours) at 01/15/2020 0755 Last data filed at 01/15/2020 0600 Gross per 24 hour  Intake 4627.99 ml  Output 3375 ml  Net 1252.99 ml   Filed Weights   01/13/20 0445 01/14/20 0414 01/15/20 0500  Weight: 121.9 kg 123.4 kg 122.8 kg    Examination: General: Acutely ill appearing male, laying in bed, lightly sedated, in NAD HENT: Atruamatic, normocephalic,  neck supple, no JVD Lungs: Clear to auscultation bilaterally, no wheezing or rales, even  Cardiovascular: Regular rate and rhythm, s1s2, no M/R/G, 2+ distal pulses Abdomen: Obese, soft, nontender, nondistended, no guarding or rebound tenderness, BS+ x4 Extremities: Normal bulk and tone, no deformities, no edema Neuro: Lightly sedated, opens eyes to voice, pupils PERRL Skin: Warm and dry.  No obvious rashes, lesions, or ulcerations  Resolved Hospital Problem list   N/A  Assessment & Plan:   Acute on chronic respiratory failure (hypoxia/hypercarbia) Underlying severe COPD and sarcoidosis Atelectasis Severe obstructive sleep apnea Continue ventilator support Wean FiO2 & PEEP as tolerated to maintain O2 sats 88 to 94% -Follow intermittent CXR & ABG as needed -S/p Trach on 01/14/20 (to remain indefinitely per ENT due to severe scarring) -Pressure support trial and Trach collar trials as tolerated -VAP protocol -Bronchodilators -Continue Pulmicort nebs   Acute kidney injury on chronic kidney disease stage IIIa>>improved Mild Hyperkalemia -Monitor I&O's / urinary output -Follow BMP -Ensure adequate renal perfusion -Avoid nephrotoxic agents as able -Replace electrolytes as indicated -Nephrology following, appreciate input -Received 10 units IV insulin, repeat K 4.9   Diabetes mellitus type 2 with chronic kidney disease -CBG's -SSI -Follow ICU Hypo/hyperglycemia protocol    Best practice (evaluated daily)   Diet: NPO; Tube feedings Pain/Anxiety/Delirium protocol (if indicated): Fentanyl, Precedex VAP protocol (if indicated): Yes DVT prophylaxis: to resume home Eliquis GI prophylaxis: Pepcid Glucose control: SSI Mobility: As tolerated last date of multidisciplinary goals of care discussion: 01/15/20 Family and staff present: no family present for update 01/15/20 Summary of discussion: N/A Follow up goals of care discussion due: 01/17/2020 Code Status: Full  Code Disposition: ICU  Labs   CBC: Recent Labs  Lab 01/09/20 0433 01/10/20 0506 01/12/20 0615 01/13/20 0552 01/14/20 0641 01/15/20 0426  WBC 5.7 6.9 8.2 7.5 6.9 8.5  NEUTROABS 3.7 5.0  --   --   --   --   HGB 13.5 13.9 12.0* 13.4 12.1* 12.9*  HCT 43.3 44.9 39.4 43.1 38.8* 40.8  MCV 92.3 90.5 93.6 92.7 91.7 90.5  PLT 157 157 144* 171 170 456    Basic Metabolic Panel: Recent Labs  Lab 01/09/20 0433 01/10/20 0506 01/11/20 0550 01/12/20 0615 01/13/20 0552 01/15/20 0426  NA 147* 144 146* 143 144 144  K 4.9 5.3* 4.8 4.6 4.9 5.3*  CL 109 109 109 109 110 109  CO2 _0 GLUCOSE 125* 130* 128* 158* 143* 154*  BUN 72* 57* 47* 55* 53* 48*  CREATININE 2.03* 1.72* 1.54* 1.58* 1.39* 1.21  CALCIUM 8.6* 9.0 8.8* 8.5* 8.7* 8.7*  MG 2.5* 2.5* 2.2 2.2 2.2  --   PHOS 4.4 3.8 3.7 5.2* 4.4  --    GFR: Estimated Creatinine Clearance: 101.4 mL/min (by C-G formula based on SCr of 1.21 mg/dL). Recent Labs  Lab 01/12/20 0615 01/13/20 0552 01/14/20 0641 01/15/20 0426  WBC 8.2 7.5 6.9 8.5    Liver Function Tests: No results for input(s): AST, ALT, ALKPHOS, BILITOT, PROT, ALBUMIN in the last 168 hours. No results for input(s): LIPASE, AMYLASE in the last 168 hours. No results for input(s): AMMONIA in the last 168 hours.  ABG    Component Value Date/Time   PHART 7.35 01/10/2020 1617   PCO2ART 55 (H) 01/10/2020 1617   PO2ART 89 01/10/2020 1617   HCO3 30.4 (H) 01/10/2020 1617   O2SAT 96.4 01/10/2020 1617     Coagulation Profile: Recent Labs  Lab 01/12/20 1156  INR 1.3*    Cardiac Enzymes: No results for input(s): CKTOTAL, CKMB, CKMBINDEX, TROPONINI in  the last 168 hours.  HbA1C: Hemoglobin A1C  Date/Time Value Ref Range Status  02/24/2013 04:54 AM 7.8 (H) 4.2 - 6.3 % Final    Comment:    The American Diabetes Association recommends that a primary goal of therapy should be <7% and that physicians should reevaluate the treatment regimen in patients with  HbA1c values consistently >8%.    Hgb A1c MFr Bld  Date/Time Value Ref Range Status  01/02/2020 04:21 AM 6.5 (H) 4.8 - 5.6 % Final    Comment:    (NOTE) Pre diabetes:          5.7%-6.4%  Diabetes:              >6.4%  Glycemic control for   <7.0% adults with diabetes   06/11/2019 04:54 AM 7.0 (H) 4.8 - 5.6 % Final    Comment:    (NOTE) Pre diabetes:          5.7%-6.4% Diabetes:              >6.4% Glycemic control for   <7.0% adults with diabetes     CBG: Recent Labs  Lab 01/14/20 1711 01/14/20 2050 01/15/20 0005 01/15/20 0338 01/15/20 0735  GLUCAP 142* 161* 134* 128* 164*    Review of Systems:   Unable to assess due to intubation and sedation  Past Medical History  He,  has a past medical history of CHF (congestive heart failure) ([place]), CKD (chronic kidney disease) stage 3, GFR 30-59 ml/min (HCC), Clear cell renal cell carcinoma (HCC), COPD (chronic obstructive pulmonary disease) ([place]), Coronary artery disease, Diabetes mellitus without complication ([place]), Hypercholesteremia ([place]), Hypertension, Myocardial infarction ([place]), Sarcoidosis, Sleep apnea, and Stroke ([place]).   Surgical History    Past Surgical History:  Procedure Laterality Date  . APPENDECTOMY    . PARTIAL NEPHRECTOMY    . TRACHEOSTOMY TUBE PLACEMENT N/A 01/14/2020   Procedure: TRACHEOSTOMY;  Surgeon: Clyde Canterbury, MD;  Location: [facility] ORS;  Service: ENT;  Laterality: N/A;     Social History   reports that he has been smoking cigarettes. He has a 6.25 pack-year smoking history. He has never used smokeless tobacco. He reports previous alcohol use. He reports current drug use. Drugs: Marijuana and PCP.   Family History   His family history includes Cancer in his father; Diabetes in his mother; Heart disease in his mother; Hypertension in his mother.   Allergies Allergies  Allergen Reactions  . Penicillins Anaphylaxis and Other (See Comments)    Has patient had a PCN reaction causing immediate rash,  facial/tongue/throat swelling, SOB or lightheadedness with hypotension: Yes Has patient had a PCN reaction causing severe rash involving mucus membranes or skin necrosis: No Has patient had a PCN reaction that required hospitalization: No Has patient had a PCN reaction occurring within the last 10 years: No If all of the above answers are "NO", then may proceed with Cephalosporin use.      Home Medications  Prior to Admission medications   Medication Sig Start Date End Date Taking? Authorizing Provider  albuterol (VENTOLIN HFA) 108 (90 Base) MCG/ACT inhaler Inhale 1-2 puffs into the lungs every 4 (four) hours as needed for wheezing or shortness of breath.   Yes [provider]  apixaban (ELIQUIS) 5 MG TABS tablet Take 5 mg by mouth 2 (two) times daily. 06/11/16  Yes [provider]  aspirin 81 MG EC tablet Take 81 mg by mouth at bedtime.    Yes [provider]  atorvastatin (LIPITOR)  40 MG tablet Take 40 mg by mouth at bedtime.    Yes [provider]  Fluticasone-Salmeterol (ADVAIR) 250-50 MCG/DOSE AEPB Inhale 1 puff into the lungs 2 (two) times daily.   Yes [provider]  furosemide (LASIX) 80 MG tablet Take 80 mg by mouth 2 (two) times daily.   Yes [provider]  insulin aspart (NOVOLOG) 100 UNIT/ML injection Inject 5-15 Units into the skin 3 (three) times daily before meals.    Yes [provider]  insulin detemir (LEVEMIR) 100 UNIT/ML injection Inject 40-50 Units into the skin at bedtime.    Yes [provider]  ipratropium-albuterol (DUONEB) 0.5-2.5 (3) MG/3ML SOLN Inhale 3 mLs into the lungs 4 (four) times daily as needed for wheezing. 09/11/18  Yes [provider]  isosorbide dinitrate (ISORDIL) 10 MG tablet Take 1 tablet (10 mg total) by mouth 3 (three) times daily. 12/29/19  Yes Hackney, Tina A, FNP  lisinopril (ZESTRIL) 30 MG tablet Take 30 mg by mouth at bedtime.    Yes [provider]   Melatonin 5 MG TABS Take 5 mg by mouth at bedtime as needed for sleep.   Yes [provider]  metoprolol tartrate (LOPRESSOR) 100 MG tablet Take 1 tablet (100 mg total) by mouth 2 (two) times daily. Patient taking differently: Take 100 mg by mouth at bedtime.  07/30/18  Yes Fritzi Mandes, MD  potassium chloride SA (KLOR-CON) 20 MEQ tablet Take 20 mEq by mouth at bedtime.    Yes [provider]  theophylline (THEODUR) 300 MG 12 hr tablet Take 300 mg by mouth 2 (two) times daily.    Yes [provider]  tiotropium (SPIRIVA) 18 MCG inhalation capsule Place 18 mcg into inhaler and inhale daily.    [provider]     Critical care time: 30 minutes     Darel Hong, [facility] Pulmonary & Critical Care Medicine Pager: [phone]

## 2020-01-15 NOTE — Progress Notes (Signed)
Pt noted to be tachypneic, RR>40, low tidal volume breaths, given versed PRN, no effect.  RT notified, vent mode switched to resting settings, pt improvement immediately noted.  Will continue to monitor

## 2020-01-15 NOTE — Progress Notes (Signed)
Pt placed back on resting settings due to increase in RR >40 and decrease in Vt <300

## 2020-01-16 ENCOUNTER — Inpatient Hospital Stay: Payer: Medicare HMO

## 2020-01-16 DIAGNOSIS — N189 Chronic kidney disease, unspecified: Secondary | ICD-10-CM

## 2020-01-16 DIAGNOSIS — N179 Acute kidney failure, unspecified: Secondary | ICD-10-CM

## 2020-01-16 LAB — CBC
HCT: 37.6 % — ABNORMAL LOW (ref 39.0–52.0)
Hemoglobin: 11.8 g/dL — ABNORMAL LOW (ref 13.0–17.0)
MCH: 28.9 pg (ref 26.0–34.0)
MCHC: 31.4 g/dL (ref 30.0–36.0)
MCV: 92.2 fL (ref 80.0–100.0)
Platelets: 188 10*3/uL (ref 150–400)
RBC: 4.08 MIL/uL — ABNORMAL LOW (ref 4.22–5.81)
RDW: 12.8 % (ref 11.5–15.5)
WBC: 8.1 10*3/uL (ref 4.0–10.5)
nRBC: 0 % (ref 0.0–0.2)

## 2020-01-16 LAB — GLUCOSE, CAPILLARY
Glucose-Capillary: 128 mg/dL — ABNORMAL HIGH (ref 70–99)
Glucose-Capillary: 132 mg/dL — ABNORMAL HIGH (ref 70–99)
Glucose-Capillary: 138 mg/dL — ABNORMAL HIGH (ref 70–99)
Glucose-Capillary: 149 mg/dL — ABNORMAL HIGH (ref 70–99)
Glucose-Capillary: 155 mg/dL — ABNORMAL HIGH (ref 70–99)
Glucose-Capillary: 156 mg/dL — ABNORMAL HIGH (ref 70–99)
Glucose-Capillary: 161 mg/dL — ABNORMAL HIGH (ref 70–99)

## 2020-01-16 LAB — BASIC METABOLIC PANEL
Anion gap: 7 (ref 5–15)
BUN: 50 mg/dL — ABNORMAL HIGH (ref 6–20)
CO2: 28 mmol/L (ref 22–32)
Calcium: 8.7 mg/dL — ABNORMAL LOW (ref 8.9–10.3)
Chloride: 109 mmol/L (ref 98–111)
Creatinine, Ser: 1.12 mg/dL (ref 0.61–1.24)
GFR, Estimated: >60 mL/min (ref >60)
Glucose, Bld: 153 mg/dL — ABNORMAL HIGH (ref 70–99)
Potassium: 4.8 mmol/L (ref 3.5–5.1)
Sodium: 144 mmol/L (ref 135–145)

## 2020-01-16 LAB — MAGNESIUM: Magnesium: 2.1 mg/dL (ref 1.7–2.4)

## 2020-01-16 LAB — PHOSPHORUS: Phosphorus: 3.8 mg/dL (ref 2.5–4.6)

## 2020-01-16 MED ORDER — CHLORHEXIDINE GLUCONATE 0.12 % MT SOLN
OROMUCOSAL | Status: AC
  Filled 2020-01-16: qty 15

## 2020-01-16 NOTE — Progress Notes (Signed)
CRITICAL CARE NOTE  CC  follow up respiratory failure  SUBJECTIVE Patient sedated.  Opens eyes and follows commands intermittently.  No fever chills vomiting reported by staff.  No diarrhea. Patient remains critically ill Prognosis is guarded     SIGNIFICANT EVENTS NONE    BP 111/67   Pulse 75   Temp 99.32 F (37.4 C)   Resp (!) 24   Ht 6' 0.99" (1.854 m)   Wt 125.7 kg   SpO2 100%   BMI 36.57 kg/m    REVIEW OF SYSTEMS  PATIENT IS UNABLE TO PROVIDE COMPLETE REVIEW OF SYSTEMS DUE TO SEVERE CRITICAL ILLNESS   PHYSICAL EXAMINATION:  GENERAL:critically ill appearing, NO resp distress HEAD: Normocephalic, atraumatic.  EYES: Pupils equal, round, reactive to light.  No scleral icterus.  MOUTH: Moist mucosal membrane. NECK: Supple. No thyromegaly. No nodules. No JVD.  PULMONARY: Few scattered rhonchi.  No wheezing  CARDIOVASCULAR: S1 and S2. Regular rate and rhythm. No murmurs, rubs, or gallops.  GASTROINTESTINAL: Soft, nontender, -distended. No masses. Positive bowel sounds. No hepatosplenomegaly.  MUSCULOSKELETAL: No swelling, clubbing, or edema.  NEUROLOGIC: Sedated but opens eyes and intermittently follows commands.  Barely able to move toes. SKIN:intact,warm,dry  INTAKE/OUTPUT  Intake/Output Summary (Last 24 hours) at 01/16/2020 1442 Last data filed at 01/16/2020 1000 Gross per 24 hour  Intake 4553.54 ml  Output 1475 ml  Net 3078.54 ml    LABS  CBC Recent Labs  Lab 01/14/20 0641 01/15/20 0426 01/16/20 0616  WBC 6.9 8.5 8.1  HGB 12.1* 12.9* 11.8*  HCT 38.8* 40.8 37.6*  PLT 170 195 188   Coag's Recent Labs  Lab 01/12/20 1156 01/13/20 0552 01/13/20 1320 01/13/20 2059  APTT 35 60* 61* 80*  INR 1.3*  --   --   --    BMET Recent Labs  Lab 01/13/20 0552 01/15/20 0426 01/15/20 1044 01/16/20 0616  NA 144 144  --  144  K 4.9 5.3* 4.9 4.8  CL 110 109  --  109  CO2 26 28  --  28  BUN 53* 48*  --  50*  CREATININE 1.39* 1.21  --  1.12   GLUCOSE 143* 154*  --  153*   Electrolytes Recent Labs  Lab 01/12/20 0615 01/13/20 0552 01/15/20 0426 01/16/20 0616  CALCIUM 8.5* 8.7* 8.7* 8.7*  MG 2.2 2.2  --  2.1  PHOS 5.2* 4.4  --  3.8   Sepsis Markers No results for input(s): LATICACIDVEN, PROCALCITON, O2SATVEN in the last 168 hours. ABG Recent Labs  Lab 01/10/20 1617  PHART 7.35  PCO2ART 55*  PO2ART 89   Liver Enzymes No results for input(s): AST, ALT, ALKPHOS, BILITOT, ALBUMIN in the last 168 hours. Cardiac Enzymes No results for input(s): TROPONINI, PROBNP in the last 168 hours. Glucose Recent Labs  Lab 01/15/20 1636 01/15/20 1940 01/16/20 0027 01/16/20 0333 01/16/20 0715 01/16/20 1114  GLUCAP 136* 121* 156* 155* 132* 138*     Recent Results (from the past 240 hour(s))  Culture, respiratory (non-expectorated)     Status: None   Collection Time: 01/09/20  7:55 PM   Specimen: Tracheal Aspirate; Respiratory  Result Value Ref Range Status   Specimen Description   Final    TRACHEAL ASPIRATE Performed at [facility], [address], [place], [place] [address]    Special Requests   Final    NONE Performed at [facility], [address] [place], [place] [address]    Gram Stain   Final  RARE WBC PRESENT,BOTH PMN AND MONONUCLEAR RARE GRAM POSITIVE COCCI IN PAIRS IN CHAINS RARE GRAM NEGATIVE RODS    Culture   Final    FEW Normal respiratory flora-no Staph aureus or Pseudomonas seen Performed at  [facility]ospital [facility], [address] [place], [place] [address]    Report Status 01/12/2020 FINAL  Final    MEDICATIONS   Current Facility-Administered Medications:  .  0.9 %  sodium chloride infusion, 250 mL, Intravenous, Continuous, Kasa, Kurian, MD .  acetaminophen (TYLENOL) tablet 650 mg, 650 mg, Per Tube, Q6H PRN **OR** acetaminophen (TYLENOL) suppository 650 mg, 650 mg, Rectal, Q6H PRN, Kasa, Kurian, MD .  acetylcysteine (MUCOMYST) 20 % nebulizer / oral solution 4 mL, 4 mL,  Nebulization, BID, Aleskerov, Fuad, MD, 4 mL at 01/16/20 0823 .  albuterol (PROVENTIL) (2.5 MG/3ML) 0.083% nebulizer solution 2.5 mg, 2.5 mg, Nebulization, Q4H PRN, Aleskerov, Fuad, MD, 2.5 mg at 01/07/20 0807 .  apixaban (ELIQUIS) tablet 5 mg, 5 mg, Per Tube, BID, Shemeca Lukasik, MD, 5 mg at 01/16/20 0916 .  bisacodyl (DULCOLAX) suppository 10 mg, 10 mg, Rectal, Daily PRN, Kasa, Kurian, MD, 10 mg at 01/14/20 0900 .  budesonide (PULMICORT) nebulizer solution 0.5 mg, 0.5 mg, Nebulization, BID, Kasa, Kurian, MD, 0.5 mg at 01/16/20 0823 .  chlorhexidine gluconate (MEDLINE KIT) (PERIDEX) 0.12 % solution 15 mL, 15 mL, Mouth Rinse, BID, Kasa, Kurian, MD, 15 mL at 01/16/20 0800 .  Chlorhexidine Gluconate Cloth 2 % PADS 6 each, 6 each, Topical, Q0600, Kasa, Kurian, MD, 6 each at 01/16/20 0600 .  dexmedetomidine (PRECEDEX) 400 MCG/100ML (4 mcg/mL) infusion, 0.4-1.2 mcg/kg/hr, Intravenous, Titrated, Gonzalez, Carmen L, MD, Last Rate: 29.8 mL/hr at 01/16/20 1244, 1 mcg/kg/hr at 01/16/20 1244 .  docusate (COLACE) 50 MG/5ML liquid 100 mg, 100 mg, Per Tube, BID, Kasa, Kurian, MD, 100 mg at 01/15/20 2131 .  famotidine (PEPCID) 40 MG/5ML suspension 20 mg, 20 mg, Per Tube, BID, Chappell, Alex B, RPH, 20 mg at 01/16/20 0916 .  feeding supplement (PROSource TF) liquid 90 mL, 90 mL, Per Tube, BID, Kasa, Kurian, MD, 90 mL at 01/16/20 0915 .  feeding supplement (VITAL HIGH PROTEIN) liquid 1,000 mL, 1,000 mL, Per Tube, Continuous, Kasa, Kurian, MD, Last Rate: 60 mL/hr at 01/16/20 1000, Infusion Verify at 01/16/20 1000 .  fentaNYL (SUBLIMAZE) bolus via infusion 50 mcg, 50 mcg, Intravenous, Q15 min PRN, Kasa, Kurian, MD, 50 mcg at 01/15/20 0033 .  fentaNYL 2500mcg in NS 250mL (10mcg/ml) infusion-PREMIX, 0-400 mcg/hr, Intravenous, Continuous, Kasa, Kurian, MD, Last Rate: 27.5 mL/hr at 01/16/20 1000, 275 mcg/hr at 01/16/20 1000 .  free water 200 mL, 200 mL, Per Tube, Q4H, Gonzalez, Carmen L, MD, 200 mL at 01/16/20 1200 .   insulin aspart (novoLOG) injection 0-20 Units, 0-20 Units, Subcutaneous, TID WC, Ortiz, David Manuel, MD, 3 Units at 01/16/20 1249 .  ipratropium-albuterol (DUONEB) 0.5-2.5 (3) MG/3ML nebulizer solution 3 mL, 3 mL, Nebulization, Q6H, Gonzalez, Carmen L, MD, 3 mL at 01/16/20 1356 .  MEDLINE mouth rinse, 15 mL, Mouth Rinse, 10 times per day, Kasa, Kurian, MD, 15 mL at 01/16/20 1200 .  metoCLOPramide (REGLAN) injection 5 mg, 5 mg, Intravenous, Q8H, Gonzalez, Carmen L, MD, 5 mg at 01/16/20 0558 .  metoprolol tartrate (LOPRESSOR) injection 5 mg, 5 mg, Intravenous, Q6H PRN, Aleskerov, Fuad, MD, 5 mg at 01/04/20 1035 .  midazolam (VERSED) 50 mg/50 mL (1 mg/mL) premix infusion, 0.5-10 mg/hr, Intravenous, Continuous, Kasa, Kurian, MD, Last Rate: 3.5 mL/hr at 01/16/20 1000, 3.5 mg/hr   at 01/16/20 1000 .  midazolam (VERSED) injection 2 mg, 2 mg, Intravenous, Q2H PRN, Keene, Jeremiah D, NP, 2 mg at 01/15/20 1223 .  midodrine (PROAMATINE) tablet 10 mg, 10 mg, Per Tube, BID WC, Kasa, Kurian, MD, 10 mg at 01/16/20 0915 .  multivitamin with minerals tablet 1 tablet, 1 tablet, Per Tube, Daily, Aleskerov, Fuad, MD, 1 tablet at 01/16/20 0915 .  ondansetron (ZOFRAN) tablet 4 mg, 4 mg, Per Tube, Q6H PRN **OR** ondansetron (ZOFRAN) injection 4 mg, 4 mg, Intravenous, Q6H PRN, Kasa, Kurian, MD .  polyethylene glycol (MIRALAX / GLYCOLAX) packet 17 g, 17 g, Per Tube, Daily, Kasa, Kurian, MD, 17 g at 01/16/20 0916 .  sennosides (SENOKOT) 8.8 MG/5ML syrup 5 mL, 5 mL, Per Tube, BID, Kasa, Kurian, MD, 5 mL at 01/15/20 2130      Indwelling Urinary Catheter continued, requirement due to   Reason to continue Indwelling Urinary Catheter for strict Intake/Output monitoring for hemodynamic instability   Central Line continued, requirement due to   Reason to continue Central Line Monitoring of central venous pressure or other hemodynamic parameters   Ventilator continued, requirement due to, resp failure    Ventilator  Sedation RASS 0 to -2     ASSESSMENT AND PLAN SYNOPSIS  Acute on chronic respiratory failure (hypoxia/hypercarbia) Underlying severe COPD and sarcoidosis Atelectasis Severe obstructive sleep apnea Continue ventilator support Wean FiO2 & PEEP as tolerated to maintain O2 sats 88 to 94% -Follow intermittent CXR & ABG as needed -S/p Trach on 01/14/20 (to remain indefinitely per ENT due to severe scarring) -Pressure support trial and Trach collar trials as tolerated -VAP protocol -Bronchodilators -Continue Pulmicort nebs   Acute kidney injury on chronic kidney disease stage IIIa>>improved  Hyperkalemia resolved -Monitor I&O's / urinary output -Follow BMP -Ensure adequate renal perfusion -Avoid nephrotoxic agents as able -Replace electrolytes as indicated -Nephrology following, appreciate input -Received 10 units IV insulin, repeat K 4.9   Diabetes mellitus type 2 with chronic kidney disease -CBG's -SSI -Follow ICU Hypo/hyperglycemia protocol    Best practice (evaluated daily)   Diet: NPO; Tube feedings Pain/Anxiety/Delirium protocol (if indicated): Fentanyl, Precedex VAP protocol (if indicated): Yes DVT prophylaxis: to resume home Eliquis GI prophylaxis: Pepcid Glucose control: SSI Mobility: As tolerated last date of multidisciplinary goals of care discussion: 01/15/20 Family and staff present: no family present for update 01/15/20 Summary of discussion: N/A Follow up goals of care discussion due: 01/17/2020 Code Status: Full Code  Critical Care Time devoted to patient care services described in this note is 35 minutes.   Overall, patient is critically ill, prognosis is guarded.  Patient with Multiorgan failure and at high risk for cardiac arrest and death.   Khalid Bashir, MD  01/16/2020 2:42 PM  Pulmonary & Critical Care Medicine    

## 2020-01-17 ENCOUNTER — Inpatient Hospital Stay: Payer: Medicare HMO

## 2020-01-17 LAB — CBC
HCT: 40.8 % (ref 39.0–52.0)
Hemoglobin: 12.8 g/dL — ABNORMAL LOW (ref 13.0–17.0)
MCH: 28.4 pg (ref 26.0–34.0)
MCHC: 31.4 g/dL (ref 30.0–36.0)
MCV: 90.5 fL (ref 80.0–100.0)
Platelets: 216 10*3/uL (ref 150–400)
RBC: 4.51 MIL/uL (ref 4.22–5.81)
RDW: 12.2 % (ref 11.5–15.5)
WBC: 11.8 10*3/uL — ABNORMAL HIGH (ref 4.0–10.5)
nRBC: 0 % (ref 0.0–0.2)

## 2020-01-17 LAB — GLUCOSE, CAPILLARY
Glucose-Capillary: 172 mg/dL — ABNORMAL HIGH (ref 70–99)
Glucose-Capillary: 128 mg/dL — ABNORMAL HIGH (ref 70–99)
Glucose-Capillary: 123 mg/dL — ABNORMAL HIGH (ref 70–99)
Glucose-Capillary: 149 mg/dL — ABNORMAL HIGH (ref 70–99)
Glucose-Capillary: 137 mg/dL — ABNORMAL HIGH (ref 70–99)

## 2020-01-17 MED ORDER — MIDAZOLAM HCL 2 MG/2ML IJ SOLN: 1.0000 mg | INTRAMUSCULAR | Status: DC | PRN

## 2020-01-17 MED ORDER — PANTOPRAZOLE SODIUM 40 MG IV SOLR
40.0000 mg | INTRAVENOUS | Status: DC
  Administered 2020-01-18 – 2020-01-25 (×8): 40 mg via INTRAVENOUS
  Filled 2020-01-17 (×8): qty 40

## 2020-01-17 MED ORDER — HEPARIN (PORCINE) 25000 UT/250ML-% IV SOLN
1600.0000 [IU]/h | INTRAVENOUS | Status: DC
  Administered 2020-01-18: 02:00:00 1600 [IU]/h via INTRAVENOUS
  Filled 2020-01-17: qty 250

## 2020-01-17 MED ORDER — PROMETHAZINE HCL 25 MG/ML IJ SOLN
12.5000 mg | Freq: Four times a day (QID) | INTRAMUSCULAR | Status: DC | PRN
  Administered 2020-01-17 – 2020-01-18 (×2): 25 mg via INTRAVENOUS
  Filled 2020-01-17 (×2): qty 1

## 2020-01-17 NOTE — Progress Notes (Addendum)
[place] for Heparin Infusion Indication: atrial fibrillation  Allergies  Allergen Reactions  . Penicillins Anaphylaxis and Other (See Comments)    Has patient had a PCN reaction causing immediate rash, facial/tongue/throat swelling, SOB or lightheadedness with hypotension: Yes Has patient had a PCN reaction causing severe rash involving mucus membranes or skin necrosis: No Has patient had a PCN reaction that required hospitalization: No Has patient had a PCN reaction occurring within the last 10 years: No If all of the above answers are "NO", then may proceed with Cephalosporin use.     Patient Measurements: Height: 6' 0.99" (185.4 cm) Weight: 125.7 kg (277 lb 1.9 oz) IBW/kg (Calculated) : 79.88 Heparin Dosing Weight: 107.7 kg  Vital Signs: Temp: 100 F (37.8 C) (12/12 2000) BP: 132/79 (12/12 2000) Pulse Rate: 120 (12/12 2000)  Labs: Recent Labs    01/15/20 0426 01/16/20 0616 01/17/20 0519  HGB 12.9* 11.8* 12.8*  HCT 40.8 37.6* 40.8  PLT 195 188 216  CREATININE 1.21 1.12  --     Estimated Creatinine Clearance: 110.8 mL/min (by C-G formula based on SCr of 1.12 mg/dL).   Medical History: Past Medical History:  Diagnosis Date  . CHF (congestive heart failure) (HCC)    EF 45-50%- 2020  . CKD (chronic kidney disease) stage 3, GFR 30-59 ml/min (HCC)   . Clear cell renal cell carcinoma (HCC)    s/p partial nephrectomy  . COPD (chronic obstructive pulmonary disease) ([place])   . Coronary artery disease   . Diabetes mellitus without complication ([place])   . Hypercholesteremia unk  . Hypertension   . Myocardial infarction ([place])   . Sarcoidosis   . Sleep apnea    does not use CPAP regularly.  . Stroke ([place])     Medications:   Apixaban 5mg  - Last dose 12/12 @ 0919  Goal of Therapy:   Heparin level 0.3-0.7 units/ml aPTT 66-102 seconds Monitor platelets by anticoagulation protocol: Yes   Plan:  Start heparin infusion at  1600 units/hr Due to DOAC, will check aPTT q6hr until therapeutic and then daily until it correlates with HL.  Check HL daily.  Continue to monitor H&H and platelets.   Renda Rolls, PharmD, [facility] 01/17/2020 10:18 PM

## 2020-01-17 NOTE — Progress Notes (Signed)
Projectile vomited x3 clear fluid, tube feeds have been off since 1700 when he vomited the first time today. Notified NP received orders for KUB xray and phenergan. Administered phenergan and cleaned patient up. Heart rate was elevated during vomiting episode, but has improved with administration of phenergan and rest. Will continue to monitor.

## 2020-01-17 NOTE — Progress Notes (Signed)
Attempted to administer ordered reglan, the patient shook his head no, provided pen and paper, he asked if I was trying to kill him. I stated no that I would not give him anything to kill him, that reglan was going to help his vomiting, he still refused and told me to call his wife before I administered any medication.

## 2020-01-17 NOTE — Progress Notes (Signed)
CRITICAL CARE NOTE  CC  follow up respiratory failure  SUBJECTIVE No events overnight.  He is awake and alert today follows commands.  No chest pain .low-grade fever .no significant cough or secretions.  Remains on vent. Patient remains critically ill Prognosis is guarded     SIGNIFICANT EVENTS  Low-grade fever  BP 120/74   Pulse 95   Temp 100.2 F (37.9 C)   Resp 19   Ht 6' 0.99" (1.854 m)   Wt 125.7 kg   SpO2 100%   BMI 36.57 kg/m    REVIEW OF SYSTEMS  PATIENT IS UNABLE TO PROVIDE COMPLETE REVIEW OF SYSTEMS DUE TO SEVERE CRITICAL ILLNESS   PHYSICAL EXAMINATION:  GENERAL:critically ill appearing, no resp distress HEAD: Normocephalic, atraumatic.  EYES: Pupils equal, round, reactive to light.  No scleral icterus.  MOUTH: Moist mucosal membrane. NECK: Supple. No thyromegaly. No nodules. No JVD.  PULMONARY: Few rhonchi without wheezing decreased breath sounds at bases  CARDIOVASCULAR: S1 and S2. Regular rate and rhythm. No murmurs, rubs, or gallops.  GASTROINTESTINAL: Soft, nontender, -distended. No masses. Positive bowel sounds. No hepatosplenomegaly.  MUSCULOSKELETAL: No swelling, clubbing, or edema.  NEUROLOGIC: Awake follows simple commands moves all extremities nonfocal  SKIN:intact,warm,dry  INTAKE/OUTPUT  Intake/Output Summary (Last 24 hours) at 01/17/2020 0908 Last data filed at 01/17/2020 0500 Gross per 24 hour  Intake 1253.31 ml  Output 2875 ml  Net -1621.69 ml    LABS  CBC Recent Labs  Lab 01/15/20 0426 01/16/20 0616 01/17/20 0519  WBC 8.5 8.1 11.8*  HGB 12.9* 11.8* 12.8*  HCT 40.8 37.6* 40.8  PLT 195 188 216   Coag's Recent Labs  Lab 01/12/20 1156 01/13/20 0552 01/13/20 1320 01/13/20 2059  APTT 35 60* 61* 80*  INR 1.3*  --   --   --    BMET Recent Labs  Lab 01/13/20 0552 01/15/20 0426 01/15/20 1044 01/16/20 0616  NA 144 144  --  144  K 4.9 5.3* 4.9 4.8  CL 110 109  --  109  CO2 26 28  --  28  BUN 53* 48*  --  50*   CREATININE 1.39* 1.21  --  1.12  GLUCOSE 143* 154*  --  153*   Electrolytes Recent Labs  Lab 01/12/20 0615 01/13/20 0552 01/15/20 0426 01/16/20 0616  CALCIUM 8.5* 8.7* 8.7* 8.7*  MG 2.2 2.2  --  2.1  PHOS 5.2* 4.4  --  3.8   Sepsis Markers No results for input(s): LATICACIDVEN, PROCALCITON, O2SATVEN in the last 168 hours. ABG Recent Labs  Lab 01/10/20 1617  PHART 7.35  PCO2ART 55*  PO2ART 89   Liver Enzymes No results for input(s): AST, ALT, ALKPHOS, BILITOT, ALBUMIN in the last 168 hours. Cardiac Enzymes No results for input(s): TROPONINI, PROBNP in the last 168 hours. Glucose Recent Labs  Lab 01/16/20 1114 01/16/20 1609 01/16/20 1936 01/16/20 2342 01/17/20 0344 01/17/20 0732  GLUCAP 138* 149* 128* 161* 172* 128*     Recent Results (from the past 240 hour(s))  Culture, respiratory (non-expectorated)     Status: None   Collection Time: 01/09/20  7:55 PM   Specimen: Tracheal Aspirate; Respiratory  Result Value Ref Range Status   Specimen Description   Final    TRACHEAL ASPIRATE Performed at [facility], [address] [place], [place] [address]    Special Requests   Final    NONE Performed at [facility], [address] [place], [place] [address]    Gram  Stain   Final    RARE WBC PRESENT,BOTH PMN AND MONONUCLEAR RARE GRAM POSITIVE COCCI IN PAIRS IN CHAINS RARE GRAM NEGATIVE RODS    Culture   Final    FEW Normal respiratory flora-no Staph aureus or Pseudomonas seen Performed at [facility], [address] [place], [place] [address]    Report Status 01/12/2020 FINAL  Final    MEDICATIONS   Current Facility-Administered Medications:  .  0.9 %  sodium chloride infusion, 250 mL, Intravenous, Continuous, Kasa, Kurian, MD .  acetaminophen (TYLENOL) tablet 650 mg, 650 mg, Per Tube, Q6H PRN **OR** acetaminophen (TYLENOL) suppository 650 mg, 650 mg, Rectal, Q6H PRN, Mortimer Fries, Kurian, MD .  acetylcysteine (MUCOMYST) 20 %  nebulizer / oral solution 4 mL, 4 mL, Nebulization, BID, Aleskerov, Fuad, MD, 4 mL at 01/17/20 0819 .  albuterol (PROVENTIL) (2.5 MG/3ML) 0.083% nebulizer solution 2.5 mg, 2.5 mg, Nebulization, Q4H PRN, Ottie Glazier, MD, 2.5 mg at 01/07/20 0807 .  apixaban (ELIQUIS) tablet 5 mg, 5 mg, Per Tube, BID, Rosine Door, MD, 5 mg at 01/16/20 2251 .  bisacodyl (DULCOLAX) suppository 10 mg, 10 mg, Rectal, Daily PRN, Flora Lipps, MD, 10 mg at 01/14/20 0900 .  budesonide (PULMICORT) nebulizer solution 0.5 mg, 0.5 mg, Nebulization, BID, Kasa, Kurian, MD, 0.5 mg at 01/17/20 0819 .  chlorhexidine gluconate (MEDLINE KIT) (PERIDEX) 0.12 % solution 15 mL, 15 mL, Mouth Rinse, BID, Kasa, Kurian, MD, 15 mL at 01/17/20 0859 .  Chlorhexidine Gluconate Cloth 2 % PADS 6 each, 6 each, Topical, Q0600, Flora Lipps, MD, 6 each at 01/17/20 [phone] .  dexmedetomidine (PRECEDEX) 400 MCG/100ML (4 mcg/mL) infusion, 0.4-1.2 mcg/kg/hr, Intravenous, Titrated, Tyler Pita, MD, Last Rate: 29.8 mL/hr at 01/17/20 0639, 1 mcg/kg/hr at 01/17/20 0639 .  docusate (COLACE) 50 MG/5ML liquid 100 mg, 100 mg, Per Tube, BID, Flora Lipps, MD, 100 mg at 01/16/20 2251 .  famotidine (PEPCID) 40 MG/5ML suspension 20 mg, 20 mg, Per Tube, BID, Benita Gutter, RPH, 20 mg at 01/16/20 0916 .  feeding supplement (PROSource TF) liquid 90 mL, 90 mL, Per Tube, BID, Flora Lipps, MD, 90 mL at 01/16/20 2254 .  feeding supplement (VITAL HIGH PROTEIN) liquid 1,000 mL, 1,000 mL, Per Tube, Continuous, Kasa, Kurian, MD, Last Rate: 60 mL/hr at 01/16/20 1800, Infusion Verify at 01/16/20 1800 .  fentaNYL (SUBLIMAZE) bolus via infusion 50 mcg, 50 mcg, Intravenous, Q15 min PRN, Flora Lipps, MD, 50 mcg at 01/15/20 0033 .  fentaNYL 2532mg in NS 2563m(1042mml) infusion-PREMIX, 0-400 mcg/hr, Intravenous, Continuous, Kasa, Kurian, MD, Last Rate: 20 mL/hr at 01/16/20 2219, 200 mcg/hr at 01/16/20 2219 .  free water 200 mL, 200 mL, Per Tube, Q4H, GonTyler PitaMD, 200 mL at 01/17/20 0848 .  insulin aspart (novoLOG) injection 0-20 Units, 0-20 Units, Subcutaneous, TID WC, OrtReubin MilanD, 3 Units at 01/17/20 0848 .  ipratropium-albuterol (DUONEB) 0.5-2.5 (3) MG/3ML nebulizer solution 3 mL, 3 mL, Nebulization, Q6H, GonTyler PitaD, 3 mL at 01/17/20 0819 .  MEDLINE mouth rinse, 15 mL, Mouth Rinse, 10 times per day, KasFlora LippsD, 15 mL at 01/17/20 0643 .  metoCLOPramide (REGLAN) injection 5 mg, 5 mg, Intravenous, Q8H, GonTyler PitaD, 5 mg at 01/16/20 2252 .  metoprolol tartrate (LOPRESSOR) injection 5 mg, 5 mg, Intravenous, Q6H PRN, AleOttie GlazierD, 5 mg at 01/04/20 1035 .  midazolam (VERSED) 50 mg/50 mL (1 mg/mL) premix infusion, 0.5-10 mg/hr, Intravenous, Continuous, KasFlora LippsD, Last Rate:  3 mL/hr at 01/20/20 0638, 3 mg/hr at 01/20/2020 8264 .  midazolam (VERSED) injection 2 mg, 2 mg, Intravenous, Q2H PRN, Darel Hong D, NP, 2 mg at 01/15/20 1223 .  midodrine (PROAMATINE) tablet 10 mg, 10 mg, Per Tube, BID WC, Flora Lipps, MD, 10 mg at January 20, 2020 0848 .  multivitamin with minerals tablet 1 tablet, 1 tablet, Per Tube, Daily, Ottie Glazier, MD, 1 tablet at 01/16/20 0915 .  ondansetron (ZOFRAN) tablet 4 mg, 4 mg, Per Tube, Q6H PRN **OR** ondansetron (ZOFRAN) injection 4 mg, 4 mg, Intravenous, Q6H PRN, Mortimer Fries, Kurian, MD .  polyethylene glycol (MIRALAX / GLYCOLAX) packet 17 g, 17 g, Per Tube, Daily, Flora Lipps, MD, 17 g at 01/16/20 0916 .  sennosides (SENOKOT) 8.8 MG/5ML syrup 5 mL, 5 mL, Per Tube, BID, Flora Lipps, MD, 5 mL at 01/16/20 2251      Indwelling Urinary Catheter continued, requirement due to   Reason to continue Indwelling Urinary Catheter for strict Intake/Output monitoring for hemodynamic instability   Central Line continued, requirement due to   Reason to continue [employer] Monitoring of central venous pressure or other hemodynamic parameters   Ventilator continued, requirement due to, resp  failure    Ventilator Sedation RASS 0 to -2     ASSESSMENT AND PLAN SYNOPSIS  Acute on chronic respiratory failure (hypoxia/hypercarbia) Underlying severe COPD and sarcoidosis Atelectasis Severe obstructive sleep apnea Continue ventilator support Wean FiO2 & PEEP as tolerated to maintain O2 sats 88 to 94% -Follow intermittent CXR & ABG as needed -S/p Trach on 01/14/20 (to remain indefinitely per ENT due to severe scarring) -Pressure support trial and Trach collar trials as tolerated -VAP protocol -Bronchodilators -Continue Pulmicort nebs Stop Versed completely today taper fentanyl.   Acute kidney injury on chronic kidney disease stage IIIa>>improved  Low grade fever with increased wbc count, will check ETS c/s, cxr stable 12/11, will repeat in AM  Hyperkalemia resolved -Monitor I&O's / urinary output -Follow BMP -Ensure adequate renal perfusion -Avoid nephrotoxic agents as able -Replace electrolytes as indicated -Nephrology following, appreciate input -Received 10 units IV insulin, repeat K 4.9   Diabetes mellitus type 2 with chronic kidney disease -CBG's -SSI -Follow ICU Hypo/hyperglycemia protocol    Best practice (evaluated daily)   Diet:NPO; Tube feedings Pain/Anxiety/Delirium protocol (if indicated):Fentanyl, Precedex VAP protocol (if indicated):Yes DVT prophylaxis:to resume home Eliquis GI prophylaxis:Pepcid Glucose control:SSI Mobility:As tolerated last date of multidisciplinary goals of care discussion: 01/15/20 Family and staff present: no family present for update 01/15/20 Summary of discussion: N/A Follow up goals of care discussion due: 2020-01-20 Code Status:Full Code     Critical Care Time devoted to patient care services described in this note is 31  minutes.   Overall, patient is critically ill, prognosis is guarded.  Patient with Multiorgan failure and at high risk for cardiac arrest and death.   Rosine Door, MD   01/20/2020 9:08 AM [facility]

## 2020-01-18 DIAGNOSIS — N170 Acute kidney failure with tubular necrosis: Secondary | ICD-10-CM

## 2020-01-18 LAB — GLUCOSE, CAPILLARY
Glucose-Capillary: 100 mg/dL — ABNORMAL HIGH (ref 70–99)
Glucose-Capillary: 106 mg/dL — ABNORMAL HIGH (ref 70–99)
Glucose-Capillary: 110 mg/dL — ABNORMAL HIGH (ref 70–99)
Glucose-Capillary: 112 mg/dL — ABNORMAL HIGH (ref 70–99)
Glucose-Capillary: 127 mg/dL — ABNORMAL HIGH (ref 70–99)
Glucose-Capillary: 136 mg/dL — ABNORMAL HIGH (ref 70–99)
Glucose-Capillary: 97 mg/dL (ref 70–99)

## 2020-01-18 LAB — PROCALCITONIN: Procalcitonin: 0.38 ng/mL

## 2020-01-18 LAB — CBC WITH DIFFERENTIAL/PLATELET
Abs Immature Granulocytes: 0.05 10*3/uL (ref 0.00–0.07)
Basophils Absolute: 0 10*3/uL (ref 0.0–0.1)
Basophils Relative: 0 %
Eosinophils Absolute: 0 10*3/uL (ref 0.0–0.5)
Eosinophils Relative: 0 %
HCT: 40.4 % (ref 39.0–52.0)
Hemoglobin: 12.4 g/dL — ABNORMAL LOW (ref 13.0–17.0)
Immature Granulocytes: 0 %
Lymphocytes Relative: 4 %
Lymphs Abs: 0.6 10*3/uL — ABNORMAL LOW (ref 0.7–4.0)
MCH: 28.1 pg (ref 26.0–34.0)
MCHC: 30.7 g/dL (ref 30.0–36.0)
MCV: 91.4 fL (ref 80.0–100.0)
Monocytes Absolute: 1.1 10*3/uL — ABNORMAL HIGH (ref 0.1–1.0)
Monocytes Relative: 8 %
Neutro Abs: 13.1 10*3/uL — ABNORMAL HIGH (ref 1.7–7.7)
Neutrophils Relative %: 88 %
Platelets: 247 10*3/uL (ref 150–400)
RBC: 4.42 MIL/uL (ref 4.22–5.81)
RDW: 12.3 % (ref 11.5–15.5)
WBC: 14.9 10*3/uL — ABNORMAL HIGH (ref 4.0–10.5)
nRBC: 0 % (ref 0.0–0.2)

## 2020-01-18 LAB — BASIC METABOLIC PANEL
Anion gap: 7 (ref 5–15)
BUN: 37 mg/dL — ABNORMAL HIGH (ref 6–20)
CO2: 33 mmol/L — ABNORMAL HIGH (ref 22–32)
Calcium: 9.2 mg/dL (ref 8.9–10.3)
Chloride: 105 mmol/L (ref 98–111)
Creatinine, Ser: 1.14 mg/dL (ref 0.61–1.24)
GFR, Estimated: >60 mL/min (ref >60)
Glucose, Bld: 141 mg/dL — ABNORMAL HIGH (ref 70–99)
Potassium: 4.8 mmol/L (ref 3.5–5.1)
Sodium: 145 mmol/L (ref 135–145)

## 2020-01-18 LAB — MAGNESIUM: Magnesium: 2.2 mg/dL (ref 1.7–2.4)

## 2020-01-18 LAB — APTT
aPTT: 46 seconds — ABNORMAL HIGH (ref 24–36)
aPTT: 142 seconds — ABNORMAL HIGH (ref 24–36)
aPTT: 67 seconds — ABNORMAL HIGH (ref 24–36)

## 2020-01-18 LAB — PHOSPHORUS: Phosphorus: 3.7 mg/dL (ref 2.5–4.6)

## 2020-01-18 LAB — HEPARIN LEVEL (UNFRACTIONATED): Heparin Unfractionated: 0.89 IU/mL — ABNORMAL HIGH (ref 0.30–0.70)

## 2020-01-18 MED ORDER — SODIUM CHLORIDE 0.9 % IV SOLN
1.0000 g | INTRAVENOUS | Status: AC
  Administered 2020-01-18 – 2020-01-22 (×4): 1 g via INTRAVENOUS
  Filled 2020-01-18 (×4): qty 10
  Filled 2020-01-18: qty 1

## 2020-01-18 MED ORDER — HEPARIN BOLUS VIA INFUSION
3000.0000 [IU] | Freq: Once | INTRAVENOUS | Status: AC
  Administered 2020-01-18: 06:00:00 3000 [IU] via INTRAVENOUS
  Filled 2020-01-18: qty 3000

## 2020-01-18 MED ORDER — HEPARIN (PORCINE) 25000 UT/250ML-% IV SOLN
1700.0000 [IU]/h | INTRAVENOUS | Status: DC
  Filled 2020-01-18: qty 250

## 2020-01-18 MED ORDER — HEPARIN (PORCINE) 25000 UT/250ML-% IV SOLN: 1950.0000 [IU]/h | INTRAVENOUS | Status: DC

## 2020-01-18 NOTE — Progress Notes (Signed)
PHARMACY -  BRIEF ANTIBIOTIC NOTE   Pharmacy received consult for Unsayn from Provider for aspiration pneumonia.  Pt has hx of PCN allergy.  Pt had received Ceftriaxone during prior admission on 07/02/16.  Recommended switching pt to Ceftriaxone 1 gm q24hr.  Provider approved of recommendation and DC Unasyn consult.  Renda Rolls, PharmD, [facility] 01/18/2020 6:38 AM

## 2020-01-18 NOTE — Progress Notes (Signed)
[place] for Heparin Infusion Indication: atrial fibrillation  Allergies  Allergen Reactions  . Penicillins Anaphylaxis and Other (See Comments)    Has patient had a PCN reaction causing immediate rash, facial/tongue/throat swelling, SOB or lightheadedness with hypotension: Yes Has patient had a PCN reaction causing severe rash involving mucus membranes or skin necrosis: No Has patient had a PCN reaction that required hospitalization: No Has patient had a PCN reaction occurring within the last 10 years: No If all of the above answers are "NO", then may proceed with Cephalosporin use.     Patient Measurements: Height: 6' 0.99" (185.4 cm) Weight: 125.7 kg (277 lb 1.9 oz) IBW/kg (Calculated) : 79.88 Heparin Dosing Weight: 107.7 kg  Vital Signs: Temp: 100.2 F (37.9 C) (12/13 0300) BP: 156/95 (12/13 0300) Pulse Rate: 101 (12/13 0300)  Labs: Recent Labs    01/16/20 0616 01/17/20 0519 01/18/20 0435  HGB 11.8* 12.8* 12.4*  HCT 37.6* 40.8 40.4  PLT 188 216 247  HEPARINUNFRC  --   --  0.89*  CREATININE 1.12  --  1.14    Estimated Creatinine Clearance: 108.9 mL/min (by C-G formula based on SCr of 1.14 mg/dL).   Medical History: Past Medical History:  Diagnosis Date  . CHF (congestive heart failure) (HCC)    EF 45-50%- 2020  . CKD (chronic kidney disease) stage 3, GFR 30-59 ml/min (HCC)   . Clear cell renal cell carcinoma (HCC)    s/p partial nephrectomy  . COPD (chronic obstructive pulmonary disease) ([place])   . Coronary artery disease   . Diabetes mellitus without complication ([place])   . Hypercholesteremia unk  . Hypertension   . Myocardial infarction ([place])   . Sarcoidosis   . Sleep apnea    does not use CPAP regularly.  . Stroke [facility])     Medications:   Apixaban 5mg  - Last dose 12/12 @ 0919  Goal of Therapy:   Heparin level 0.3-0.7 units/ml aPTT 66-102 seconds Monitor platelets by anticoagulation protocol: Yes    12/13 @ 0435 aPTT 46 Subtherapeutic, HL 0.89   Plan:  Ordering additional bolus of 3000 units. Increasing infusion rate to 1950 units/hr. Due to DOAC, will check aPTT q6hr until therapeutic and then daily until it correlates with HL.  Check HL daily.  Continue to monitor H&H and platelets.   Renda Rolls, PharmD, [facility] 01/18/2020 5:03 AM

## 2020-01-18 NOTE — Progress Notes (Signed)
CRITICAL CARE NOTE  CC  Follow up resp failure   HPI Remains on vent Patient requiring vent support  Prognosis is guarded     SIGNIFICANT EVENTS  Low-grade fever  BP 139/73   Pulse (!) 116   Temp 98.6 F (37 C)   Resp (!) 24   Ht 6' 0.99" (1.854 m)   Wt 125.7 kg   SpO2 100%   BMI 36.57 kg/m     Review of Systems: Patient alert and awake Other:  All other systems negative  Physical Examination:   General Appearance: No distress s/p trach Neuro:without focal findings,  speech normal,  HEENT: PERRLA, EOM intact.   Pulmonary: normal breath sounds, No wheezing.  CardiovascularNormal S1,S2.  No m/r/g.   Abdomen: Benign, Soft, non-tender. Renal:  No costovertebral tenderness  GU:  Not performed at this time. Endoc: No evident thyromegaly Skin:   warm, no rashes, no ecchymosis  Extremities: normal, no cyanosis, clubbing. PSYCHIATRIC: Mood, affect within normal limits.   Intake/Output Summary (Last 24 hours) at 01/18/2020 1549 Last data filed at 01/18/2020 1200 Gross per 24 hour  Intake 499.05 ml  Output 2101 ml  Net -1601.95 ml    LABS  CBC Recent Labs  Lab 01/16/20 0616 01/17/20 0519 01/18/20 0435  WBC 8.1 11.8* 14.9*  HGB 11.8* 12.8* 12.4*  HCT 37.6* 40.8 40.4  PLT 188 216 247   Coag's Recent Labs  Lab 01/12/20 1156 01/13/20 0552 01/13/20 2059 01/18/20 0435 01/18/20 1243  APTT 35   < > 80* 46* 142*  INR 1.3*  --   --   --   --    < > = values in this interval not displayed.   BMET Recent Labs  Lab 01/15/20 0426 01/15/20 1044 01/16/20 0616 01/18/20 0435  NA 144  --  144 145  K 5.3* 4.9 4.8 4.8  CL 109  --  109 105  CO2 28  --  28 33*  BUN 48*  --  50* 37*  CREATININE 1.21  --  1.12 1.14  GLUCOSE 154*  --  153* 141*   Electrolytes Recent Labs  Lab 01/13/20 0552 01/15/20 0426 01/16/20 0616 01/18/20 0435  CALCIUM 8.7* 8.7* 8.7* 9.2  MG 2.2  --  2.1 2.2  PHOS 4.4  --  3.8 3.7   Sepsis Markers Recent Labs  Lab  01/18/20 0435  PROCALCITON 0.38   ABG No results for input(s): PHART, PCO2ART, PO2ART in the last 168 hours. Liver Enzymes No results for input(s): AST, ALT, ALKPHOS, BILITOT, ALBUMIN in the last 168 hours. Cardiac Enzymes No results for input(s): TROPONINI, PROBNP in the last 168 hours. Glucose Recent Labs  Lab 01/17/20 1644 01/17/20 1944 01/18/20 0043 01/18/20 0431 01/18/20 0751 01/18/20 1150  GLUCAP 149* 137* 136* 127* 110* 97     Recent Results (from the past 240 hour(s))  Culture, respiratory (non-expectorated)     Status: None   Collection Time: 01/09/20  7:55 PM   Specimen: Tracheal Aspirate; Respiratory  Result Value Ref Range Status   Specimen Description   Final    TRACHEAL ASPIRATE Performed at [facility], [address] [place], [place] [address]    Special Requests   Final    NONE Performed at [place], [place] [address]    Gram Stain   Final    RARE WBC PRESENT,BOTH PMN AND MONONUCLEAR RARE GRAM POSITIVE COCCI IN PAIRS IN CHAINS RARE GRAM NEGATIVE RODS  Culture   Final    FEW Normal respiratory flora-no Staph aureus or Pseudomonas seen Performed at [place] [address] [place], [place] [address]    Report Status 01/12/2020 FINAL  Final  Culture, respiratory (non-expectorated)     Status: None (Preliminary result)   Collection Time: 01/17/20 12:13 PM   Specimen: SPU; Respiratory  Result Value Ref Range Status   Specimen Description   Final    SPUTUM Performed at [facility], [address], [place], [place] [address]    Special Requests   Final    NONE Performed at [facility], [address] [place], [place] [address]    Gram Stain   Final    FEW WBC PRESENT, PREDOMINANTLY PMN FEW GRAM NEGATIVE RODS RARE GRAM POSITIVE COCCI    Culture   Final    CULTURE REINCUBATED FOR BETTER GROWTH Performed at [facility], [place] [address] [place],  [place] [address]    Report Status PENDING  Incomplete    MEDICATIONS   Current Facility-Administered Medications:  .  0.9 %  sodium chloride infusion, 250 mL, Intravenous, Continuous, Kathrynne Kulinski, MD .  acetaminophen (TYLENOL) tablet 650 mg, 650 mg, Per Tube, Q6H PRN **OR** acetaminophen (TYLENOL) suppository 650 mg, 650 mg, Rectal, Q6H PRN, Mortimer Fries, Joi Leyva, MD .  acetylcysteine (MUCOMYST) 20 % nebulizer / oral solution 4 mL, 4 mL, Nebulization, BID, Lanney Gins, Fuad, MD, 4 mL at 01/18/20 0736 .  albuterol (PROVENTIL) (2.5 MG/3ML) 0.083% nebulizer solution 2.5 mg, 2.5 mg, Nebulization, Q4H PRN, Ottie Glazier, MD, 2.5 mg at 01/07/20 0807 .  bisacodyl (DULCOLAX) suppository 10 mg, 10 mg, Rectal, Daily PRN, Flora Lipps, MD, 10 mg at 01/14/20 0900 .  budesonide (PULMICORT) nebulizer solution 0.5 mg, 0.5 mg, Nebulization, BID, Satomi Buda, MD, 0.5 mg at 01/18/20 0735 .  cefTRIAXone (ROCEPHIN) 1 g in sodium chloride 0.9 % 100 mL IVPB, 1 g, Intravenous, Q24H, Rust-Chester, Britton L, NP, Last Rate: 200 mL/hr at 01/18/20 0641, 1 g at 01/18/20 0641 .  chlorhexidine gluconate (MEDLINE KIT) (PERIDEX) 0.12 % solution 15 mL, 15 mL, Mouth Rinse, BID, Amena Dockham, MD, 15 mL at 01/18/20 0807 .  Chlorhexidine Gluconate Cloth 2 % PADS 6 each, 6 each, Topical, Q0600, Flora Lipps, MD, 6 each at 01/18/20 0605 .  dexmedetomidine (PRECEDEX) 400 MCG/100ML (4 mcg/mL) infusion, 0.4-1.2 mcg/kg/hr, Intravenous, Titrated, Tyler Pita, MD, Last Rate: 17.88 mL/hr at 01/17/20 1207, 0.6 mcg/kg/hr at 01/17/20 1207 .  docusate (COLACE) 50 MG/5ML liquid 100 mg, 100 mg, Per Tube, BID, Flora Lipps, MD, 100 mg at 01/17/20 0919 .  feeding supplement (PROSource TF) liquid 90 mL, 90 mL, Per Tube, BID, Flora Lipps, MD, 90 mL at 01/17/20 0942 .  feeding supplement (VITAL HIGH PROTEIN) liquid 1,000 mL, 1,000 mL, Per Tube, Continuous, Kingstin Heims, MD, Last Rate: 60 mL/hr at 01/17/20 1549, 1,000 mL at 01/17/20 1549 .  fentaNYL  (SUBLIMAZE) bolus via infusion 50 mcg, 50 mcg, Intravenous, Q15 min PRN, Flora Lipps, MD, 50 mcg at 01/15/20 0033 .  fentaNYL 2576mg in NS 2519m(1061mml) infusion-PREMIX, 0-100 mcg/hr, Intravenous, Continuous, BasRosine DoorD, Last Rate: 15 mL/hr at 01/18/20 0943, 150 mcg/hr at 01/18/20 0943 .  free water 200 mL, 200 mL, Per Tube, Q4H, GonVernard Gambles MD, 200 mL at 01/17/20 1550 .  heparin ADULT infusion 100 units/mL (25000 units/250m42mdium chloride 0.45%), 1,650 Units/hr, Intravenous, Continuous, DolaDorothe PeaH, Last Rate: 16.5 mL/hr at 01/18/20 1450, 1,650  Units/hr at 01/18/20 1450 .  insulin aspart (novoLOG) injection 0-20 Units, 0-20 Units, Subcutaneous, TID WC, Reubin Milan, MD, 3 Units at 01/17/20 1805 .  ipratropium-albuterol (DUONEB) 0.5-2.5 (3) MG/3ML nebulizer solution 3 mL, 3 mL, Nebulization, Q6H, Tyler Pita, MD, 3 mL at 01/18/20 1330 .  MEDLINE mouth rinse, 15 mL, Mouth Rinse, 10 times per day, Flora Lipps, MD, 15 mL at 01/18/20 1455 .  metoCLOPramide (REGLAN) injection 5 mg, 5 mg, Intravenous, Q8H, Tyler Pita, MD, 5 mg at 01/18/20 1455 .  metoprolol tartrate (LOPRESSOR) injection 5 mg, 5 mg, Intravenous, Q6H PRN, Lanney Gins, Fuad, MD, 5 mg at 01/18/20 0207 .  midazolam (VERSED) injection 1 mg, 1 mg, Intravenous, Q2H PRN, Rosine Door, MD .  midodrine (PROAMATINE) tablet 10 mg, 10 mg, Per Tube, BID WC, Flora Lipps, MD, 10 mg at 01/17/20 0848 .  multivitamin with minerals tablet 1 tablet, 1 tablet, Per Tube, Daily, Ottie Glazier, MD, 1 tablet at 01/17/20 0919 .  ondansetron (ZOFRAN) tablet 4 mg, 4 mg, Per Tube, Q6H PRN **OR** ondansetron (ZOFRAN) injection 4 mg, 4 mg, Intravenous, Q6H PRN, Flora Lipps, MD, 4 mg at 01/17/20 1804 .  pantoprazole (PROTONIX) injection 40 mg, 40 mg, Intravenous, Q24H, Rust-Chester, Britton L, NP, 40 mg at 01/18/20 1151 .  polyethylene glycol (MIRALAX / GLYCOLAX) packet 17 g, 17 g, Per Tube, Daily, Flora Lipps,  MD, 17 g at 01/17/20 0920 .  promethazine (PHENERGAN) injection 12.5-25 mg, 12.5-25 mg, Intravenous, Q6H PRN, Rust-Chester, Britton L, NP, 25 mg at 01/18/20 0249 .  sennosides (SENOKOT) 8.8 MG/5ML syrup 5 mL, 5 mL, Per Tube, BID, Mortimer Fries, Bay Wayson, MD, 5 mL at 01/17/20 0919      ASSESSMENT AND PLAN SYNOPSIS  Acute on chronic respiratory failure (hypoxia/hypercarbia) Underlying severe COPD and sarcoidosis Atelectasis Severe obstructive sleep apnea Continue vent support Wean fio2 tolerating PS mode -S/p Trach on 01/14/20 (to remain indefinitely per ENT due to severe scarring)   Diabetes mellitus type 2 with chronic kidney disease -CBG's -SSI -Follow ICU Hypo/hyperglycemia protocol   DVT/GI PRX ordered and assessed TRANSFUSIONS AS NEEDED MONITOR FSBS I Assessed the need for Labs I Assessed the need for Foley I Assessed the need for Central Venous Line Family Discussion when available I Assessed the need for Mobilization I made an Assessment of medications to be adjusted accordingly Safety Risk assessment completed  CASE DISCUSSED IN MULTIDISCIPLINARY ROUNDS WITH ICU TEAM     Clatie Kessen Patricia Pesa, M.D.  [facility] Pulmonary & Critical Care Medicine  Medical Director [place] Director [place] Department

## 2020-01-18 NOTE — Progress Notes (Signed)
[place] for Heparin Infusion Indication: atrial fibrillation  Allergies  Allergen Reactions  . Penicillins Anaphylaxis and Other (See Comments)    Has patient had a PCN reaction causing immediate rash, facial/tongue/throat swelling, SOB or lightheadedness with hypotension: Yes Has patient had a PCN reaction causing severe rash involving mucus membranes or skin necrosis: No Has patient had a PCN reaction that required hospitalization: No Has patient had a PCN reaction occurring within the last 10 years: No If all of the above answers are "NO", then may proceed with Cephalosporin use.     Patient Measurements: Height: 6' 0.99" (185.4 cm) Weight: 125.7 kg (277 lb 1.9 oz) IBW/kg (Calculated) : 79.88 Heparin Dosing Weight: 107.7 kg  Vital Signs: Temp: 98.42 F (36.9 C) (12/13 1200) Temp Source: Bladder (12/13 1200) BP: 131/74 (12/13 1200) Pulse Rate: 96 (12/13 1200)  Labs: Recent Labs    01/16/20 0616 01/17/20 0519 01/18/20 0435 01/18/20 1243  HGB 11.8* 12.8* 12.4*  --   HCT 37.6* 40.8 40.4  --   PLT 188 216 247  --   APTT  --   --  46* 142*  HEPARINUNFRC  --   --  0.89*  --   CREATININE 1.12  --  1.14  --     Estimated Creatinine Clearance: 108.9 mL/min (by C-G formula based on SCr of 1.14 mg/dL).   Medical History: Past Medical History:  Diagnosis Date  . CHF (congestive heart failure) (HCC)    EF 45-50%- 2020  . CKD (chronic kidney disease) stage 3, GFR 30-59 ml/min (HCC)   . Clear cell renal cell carcinoma (HCC)    s/p partial nephrectomy  . COPD (chronic obstructive pulmonary disease) ([place])   . Coronary artery disease   . Diabetes mellitus without complication ([place])   . Hypercholesteremia unk  . Hypertension   . Myocardial infarction ([place])   . Sarcoidosis   . Sleep apnea    does not use CPAP regularly.  . Stroke [facility])     Medications:   Apixaban 5mg  - Last dose 12/12 @ 0919  Goal of Therapy:   Heparin  level 0.3-0.7 units/ml aPTT 66-102 seconds Monitor platelets by anticoagulation protocol: Yes   12/13 @ 0435 aPTT 46 Subtherapeutic, HL 0.89, bolus 3000 units x1, increased infusion rate to 1950 units/hr. 12/13 @ 1243 aPTT 142 (supratherapeutic), will hold x 1 hr, then decrease rate to 1650 units/hr   Plan:   Hold infusion x 1 hour. Then resume at reduced rate of 1650 units/hr   Due to DOAC, will check aPTT q6hr until therapeutic and then daily until it correlates with HL.  Check HL daily.   Continue to monitor H&H and platelets.   Virl Cagey, PharmD, BCPS 01/18/2020 1:44 PM

## 2020-01-18 NOTE — Progress Notes (Signed)
[place] for Heparin Infusion Indication: atrial fibrillation  Allergies  Allergen Reactions  . Penicillins Anaphylaxis and Other (See Comments)    Has patient had a PCN reaction causing immediate rash, facial/tongue/throat swelling, SOB or lightheadedness with hypotension: Yes Has patient had a PCN reaction causing severe rash involving mucus membranes or skin necrosis: No Has patient had a PCN reaction that required hospitalization: No Has patient had a PCN reaction occurring within the last 10 years: No If all of the above answers are "NO", then may proceed with Cephalosporin use.     Patient Measurements: Height: 6' 0.99" (185.4 cm) Weight: 125.7 kg (277 lb 1.9 oz) IBW/kg (Calculated) : 79.88 Heparin Dosing Weight: 107.7 kg  Vital Signs: Temp: 99.3 F (37.4 C) (12/13 2100) Temp Source: Bladder (12/13 1600) BP: 144/78 (12/13 2100) Pulse Rate: 111 (12/13 2100)  Labs: Recent Labs    01/16/20 0616 01/17/20 0519 01/18/20 0435 01/18/20 1243 01/18/20 2100  HGB 11.8* 12.8* 12.4*  --   --   HCT 37.6* 40.8 40.4  --   --   PLT 188 216 247  --   --   APTT  --   --  46* 142* 67*  HEPARINUNFRC  --   --  0.89*  --   --   CREATININE 1.12  --  1.14  --   --     Estimated Creatinine Clearance: 108.9 mL/min (by C-G formula based on SCr of 1.14 mg/dL).   Medical History: Past Medical History:  Diagnosis Date  . CHF (congestive heart failure) (HCC)    EF 45-50%- 2020  . CKD (chronic kidney disease) stage 3, GFR 30-59 ml/min (HCC)   . Clear cell renal cell carcinoma (HCC)    s/p partial nephrectomy  . COPD (chronic obstructive pulmonary disease) ([place])   . Coronary artery disease   . Diabetes mellitus without complication ([place])   . Hypercholesteremia unk  . Hypertension   . Myocardial infarction ([place])   . Sarcoidosis   . Sleep apnea    does not use CPAP regularly.  . Stroke [facility])     Medications:   Apixaban 5mg  - Last dose 12/12  @ 0919  Goal of Therapy:   Heparin level 0.3-0.7 units/ml aPTT 66-102 seconds Monitor platelets by anticoagulation protocol: Yes   12/13 @ 0435 aPTT 46 Subtherapeutic, HL 0.89, bolus 3000 units x1, increased infusion rate to 1950 units/hr. 12/13 @ 1243 aPTT 142 (supratherapeutic), will hold x 1 hr, then decrease rate to 1650 units/hr 12/13 @2100  aPTT 67    Plan:  APTT is therapeutic on the lower end of goal. Will increase heparin to 1700 units/hr. F/u with aPTT in 6 hours.  Heparin level is falsely elevated due to DOAC PTA. Heparin level is trending down, once HL and aPTT are correlating, will switch to heparin level for monitoring. CBC daily while on heparin. Heparin level with AM labs.    Eleonore Chiquito, PharmD, BCPS 01/18/2020 9:42 PM

## 2020-01-18 NOTE — Progress Notes (Signed)
Nutrition Follow-up  DOCUMENTATION CODES:   Obesity unspecified  INTERVENTION:  Once able to resume tube feeds initiate Vital High Protein at 20 mL/hr and advance by 20 mL/hr every 8 hours to goal rate of 60 mL/hr (1440 ml goal daily volume). Also provide PROSource TF 90 mL BID per tube. Goal regimen provides 1600 kcal, 170 grams of protein, 1210 mL H2O daily.  NUTRITION DIAGNOSIS:   Inadequate oral intake related to inability to eat as evidenced by NPO status.  Ongoing.  GOAL:   Patient will meet greater than or equal to 90% of their needs  Not met at this time.  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  11/27 intubated 11/29 extubated then later re-intubated 12/4 TFs held s/p emesis 12/6 TFs resumed 12/9 s/p tracheostomy tube placement 12/12 TFs held s/p emesis and patient with mild ileus per abdominal x-ray 12/12  Patient is currently on ventilator support via tracheostomy MV: 10.6 L/min Temp (24hrs), Avg:100 F (37.8 C), Min:98.42 F (36.9 C), Max:100.8 F (38.2 C)  Medications reviewed and include: Colace 100 mg BID, free water 200 mL Q4hrs, Novolog 0-20 units TID, MVI daily, Protonix, Miralax, sennosides 5 mL BID, ceftriaxone, fentanyl gtt, heparin infusion.  Labs reviewed: CBG 97-136, BUN 37.  I/O: 3500 mL UOP yesterday (1.2 mL/kg/hr)  Weight trend: wt fluctuating during admission but last wt was 125.7 kg on 12/11; +4.7 kg from 11/27  Enteral Access: 18 Fr. NGT placed 12/10; terminates in stomach per abdominal x-ray 12/12  Discussed with RN and on rounds. Plan is to  continue to hold tube feeds for today in setting of ileus. Patient will need to be on ventilator 21 days before approval for LTAC.  Diet Order:   Diet Order            Diet NPO time specified  Diet effective now                EDUCATION NEEDS:   No education needs have been identified at this time  Skin:  Skin Assessment: Skin Integrity Issues: Skin Integrity Issues:: Incisions Incisions: closed incision to neck  Last BM:  01/15/2020 - large type 7  Height:   Ht Readings from Last 1 Encounters:  01/13/20 6' 0.99" (1.854 m)   Weight:   Wt Readings from Last 1 Encounters:  01/16/20 125.7 kg   Ideal Body Weight:  83.6 kg  BMI:  Body mass index is 36.57 kg/m.  Estimated Nutritional Needs:   Kcal:  1341-1707 (11-14 kcal/kg)  Protein:  167 grams (2 grams/kg IBW)  Fluid:  2 L/day  Jacklynn Barnacle, MS, RD, LDN Pager number available on Amion

## 2020-01-19 LAB — GLUCOSE, CAPILLARY
Glucose-Capillary: 111 mg/dL — ABNORMAL HIGH (ref 70–99)
Glucose-Capillary: 103 mg/dL — ABNORMAL HIGH (ref 70–99)
Glucose-Capillary: 97 mg/dL (ref 70–99)
Glucose-Capillary: 96 mg/dL (ref 70–99)
Glucose-Capillary: 108 mg/dL — ABNORMAL HIGH (ref 70–99)

## 2020-01-19 LAB — CBC
HCT: 38.6 % — ABNORMAL LOW (ref 39.0–52.0)
Hemoglobin: 11.8 g/dL — ABNORMAL LOW (ref 13.0–17.0)
MCH: 28.2 pg (ref 26.0–34.0)
MCHC: 30.6 g/dL (ref 30.0–36.0)
MCV: 92.1 fL (ref 80.0–100.0)
Platelets: 251 10*3/uL (ref 150–400)
RBC: 4.19 MIL/uL — ABNORMAL LOW (ref 4.22–5.81)
RDW: 12.4 % (ref 11.5–15.5)
WBC: 12 10*3/uL — ABNORMAL HIGH (ref 4.0–10.5)
nRBC: 0 % (ref 0.0–0.2)

## 2020-01-19 LAB — HEPARIN LEVEL (UNFRACTIONATED): Heparin Unfractionated: 0.36 IU/mL (ref 0.30–0.70)

## 2020-01-19 LAB — PROCALCITONIN: Procalcitonin: 0.48 ng/mL

## 2020-01-19 LAB — APTT: aPTT: 34 seconds (ref 24–36)

## 2020-01-19 LAB — HEMOGLOBIN AND HEMATOCRIT, BLOOD
HCT: 38.8 % — ABNORMAL LOW (ref 39.0–52.0)
Hemoglobin: 12 g/dL — ABNORMAL LOW (ref 13.0–17.0)

## 2020-01-19 MED ORDER — VITAL HIGH PROTEIN PO LIQD
1000.0000 mL | ORAL | Status: DC
  Administered 2020-01-19: 1000 mL

## 2020-01-19 MED ORDER — HEPARIN (PORCINE) 25000 UT/250ML-% IV SOLN: 1850.0000 [IU]/h | INTRAVENOUS | Status: DC

## 2020-01-19 NOTE — Progress Notes (Signed)
OT Cancellation Note  Patient Details Name: [name] MRN: [id number] DOB: 12-31-1970   Cancelled Treatment:    Reason Eval/Treat Not Completed: Medical issues which prohibited therapy. Consult received and chart reviewed. Pt with noted bleeding from trach per notes. MD in room applying sutures upon arrival. Per request, will hold at this time so packing doesn't dislodge. Will re-attempt eval at another time.  Dessie Coma, M.S. OTR/L  01/19/20, 3:56 PM  ascom [phone]

## 2020-01-19 NOTE — Progress Notes (Signed)
PT Cancellation Note  Patient Details Name: [name] MRN: [id number] DOB: 04/26/1970   Cancelled Treatment:    Reason Eval/Treat Not Completed: Other (comment). Consult received and chart reviewed. Pt with noted bleeding from trach per notes. MD in room applying sutures upon arrival. Per request, will hold at this time so packing doesn't dislodge. Will re-attempt eval at another time.   Oluwaferanmi Wain 01/19/2020, 3:13 PM  Greggory Stallion, PT, DPT [phone]

## 2020-01-19 NOTE — Progress Notes (Signed)
[place] for Heparin Infusion Indication: atrial fibrillation  Allergies  Allergen Reactions  . Penicillins Anaphylaxis and Other (See Comments)    Has patient had a PCN reaction causing immediate rash, facial/tongue/throat swelling, SOB or lightheadedness with hypotension: Yes Has patient had a PCN reaction causing severe rash involving mucus membranes or skin necrosis: No Has patient had a PCN reaction that required hospitalization: No Has patient had a PCN reaction occurring within the last 10 years: No If all of the above answers are "NO", then may proceed with Cephalosporin use.     Patient Measurements: Height: 6' 0.99" (185.4 cm) Weight: 125.7 kg (277 lb 1.9 oz) IBW/kg (Calculated) : 79.88 Heparin Dosing Weight: 107.7 kg  Vital Signs: Temp: 99.86 F (37.7 C) (12/14 0730) BP: 145/86 (12/14 0730) Pulse Rate: 103 (12/14 0730)  Labs: Recent Labs    01/17/20 0519 01/17/20 0519 01/18/20 0435 01/18/20 1243 01/18/20 2100 01/19/20 0237 01/19/20 0601  HGB 12.8*  --  12.4*  --   --  12.0* 11.8*  HCT 40.8  --  40.4  --   --  38.8* 38.6*  PLT 216  --  247  --   --   --  251  APTT  --    < > 46* 142* 67*  --  34  HEPARINUNFRC  --   --  0.89*  --   --   --  0.36  CREATININE  --   --  1.14  --   --   --   --    < > = values in this interval not displayed.    Estimated Creatinine Clearance: 108.9 mL/min (by C-G formula based on SCr of 1.14 mg/dL).   Medical History: Past Medical History:  Diagnosis Date  . CHF (congestive heart failure) (HCC)    EF 45-50%- 2020  . CKD (chronic kidney disease) stage 3, GFR 30-59 ml/min (HCC)   . Clear cell renal cell carcinoma (HCC)    s/p partial nephrectomy  . COPD (chronic obstructive pulmonary disease) ([place])   . Coronary artery disease   . Diabetes mellitus without complication ([place])   . Hypercholesteremia unk  . Hypertension   . Myocardial infarction ([place])   . Sarcoidosis   . Sleep apnea     does not use CPAP regularly.  . Stroke [facility])     Medications:   Apixaban 5mg  - Last dose 12/12 @ 0919  Goal of Therapy:   Heparin level 0.3-0.7 units/ml aPTT 66-102 seconds Monitor platelets by anticoagulation protocol: Yes   12/13 @ 0435 aPTT 46 Subtherapeutic, HL 0.89, bolus 3000 units x1, increased infusion rate to 1950 units/hr. 12/13 @ 1243 aPTT 142 (supratherapeutic), will hold x 1 hr, then decrease rate to 1650 units/hr 12/13 @ 2100 aPTT 67 (therapeutic) on lower end, increaseed heparin to 1700 units/hr  12/14 @ 0601 aPTT 34 Subtherapeutic, HL 0.36, therapeutic -- Heparin infusion off since 0253 AM due to bleeding at trach site 12/14  Plan:  APTT is subtherapeutic. Heparin infusion has been held since 0250 AM due to bleeding at trach site. Will hold heparin infusion until cleared to resume by attending provider.   Virl Cagey, PharmD, BCPS 01/19/2020 7:59 AM

## 2020-01-19 NOTE — Progress Notes (Signed)
Started bleeding at trach site, notified NP and applied surgicell around sutures. Per NP holding heparin till AM

## 2020-01-19 NOTE — Progress Notes (Signed)
[place] for Heparin Infusion Indication: atrial fibrillation  Allergies  Allergen Reactions  . Penicillins Anaphylaxis and Other (See Comments)    Has patient had a PCN reaction causing immediate rash, facial/tongue/throat swelling, SOB or lightheadedness with hypotension: Yes Has patient had a PCN reaction causing severe rash involving mucus membranes or skin necrosis: No Has patient had a PCN reaction that required hospitalization: No Has patient had a PCN reaction occurring within the last 10 years: No If all of the above answers are "NO", then may proceed with Cephalosporin use.     Patient Measurements: Height: 6' 0.99" (185.4 cm) Weight: 125.7 kg (277 lb 1.9 oz) IBW/kg (Calculated) : 79.88 Heparin Dosing Weight: 107.7 kg  Vital Signs: Temp: 99.9 F (37.7 C) (12/14 0500) BP: 155/82 (12/14 0500) Pulse Rate: 112 (12/14 0500)  Labs: Recent Labs    01/17/20 0519 01/17/20 0519 01/18/20 0435 01/18/20 1243 01/18/20 2100 01/19/20 0237 01/19/20 0601  HGB 12.8*  --  12.4*  --   --  12.0* 11.8*  HCT 40.8  --  40.4  --   --  38.8* 38.6*  PLT 216  --  247  --   --   --  251  APTT  --    < > 46* 142* 67*  --  34  HEPARINUNFRC  --   --  0.89*  --   --   --  0.36  CREATININE  --   --  1.14  --   --   --   --    < > = values in this interval not displayed.    Estimated Creatinine Clearance: 108.9 mL/min (by C-G formula based on SCr of 1.14 mg/dL).   Medical History: Past Medical History:  Diagnosis Date  . CHF (congestive heart failure) (HCC)    EF 45-50%- 2020  . CKD (chronic kidney disease) stage 3, GFR 30-59 ml/min (HCC)   . Clear cell renal cell carcinoma (HCC)    s/p partial nephrectomy  . COPD (chronic obstructive pulmonary disease) ([place])   . Coronary artery disease   . Diabetes mellitus without complication ([place])   . Hypercholesteremia unk  . Hypertension   . Myocardial infarction ([place])   . Sarcoidosis   . Sleep apnea     does not use CPAP regularly.  . Stroke [facility])     Medications:   Apixaban 5mg  - Last dose 12/12 @ 0919  Goal of Therapy:   Heparin level 0.3-0.7 units/ml aPTT 66-102 seconds Monitor platelets by anticoagulation protocol: Yes   12/13 @ 0435 aPTT 46 Subtherapeutic, HL 0.89, bolus 3000 units x1, increased infusion rate to 1950 units/hr. 12/13 @ 1243 aPTT 142 (supratherapeutic), will hold x 1 hr, then decrease rate to 1650 units/hr 12/13 @ 2100 aPTT 67  12/14 @ 0601 aPTT 34 Subtherapeutic, HL 0.36, therapeutic  Plan:  APTT is subtherapeutic. Will increase heparin to 1850 units/hr.  Heparin level was falsely elevated due to DOAC PTA. Heparin level had continue to trend down to 0.36. Now HL and aPTT are starting to correlate, will start to transition to heparin level for monitoring with F/u with aPTT and HL in 6 hours.  CBC daily while on heparin. Heparin level with AM labs.   Renda Rolls, PharmD, [facility] 01/19/2020 7:10 AM

## 2020-01-19 NOTE — Consult Note (Signed)
[name], [name] [id number] 1970/02/21 Corey Nearing, MD  Reason for Consult: bleeding from around trach Requesting Physician: Flora Lipps, MD Consulting Physician: Corey Nearing, MD  HPI: This 49 y.o. year old male was admitted on 01/01/2020 for Shortness of breath [R06.02] Hypercapnia [R06.89] COPD exacerbation ([place]) [J44.1] Acute respiratory failure with hypoxia and hypercapnia ([place]) [J96.01, J96.02] Acute and chr resp failure, unsp w hypoxia or hypercapnia ([place]) [J96.20]. S/p trach last week with some bleeding from around trach which stopped spontaneously. Heparin has been held.   Medications:  Current Facility-Administered Medications  Medication Dose Route Frequency Provider Last Rate Last Admin  . 0.9 %  sodium chloride infusion  250 mL Intravenous Continuous Flora Lipps, MD      . acetaminophen (TYLENOL) tablet 650 mg  650 mg Per Tube Q6H PRN Flora Lipps, MD       Or  . acetaminophen (TYLENOL) suppository 650 mg  650 mg Rectal Q6H PRN Flora Lipps, MD      . acetylcysteine (MUCOMYST) 20 % nebulizer / oral solution 4 mL  4 mL Nebulization BID Ottie Glazier, MD   4 mL at 01/19/20 0750  . albuterol (PROVENTIL) (2.5 MG/3ML) 0.083% nebulizer solution 2.5 mg  2.5 mg Nebulization Q4H PRN Ottie Glazier, MD   2.5 mg at 01/07/20 0807  . bisacodyl (DULCOLAX) suppository 10 mg  10 mg Rectal Daily PRN Flora Lipps, MD   10 mg at 01/14/20 0900  . budesonide (PULMICORT) nebulizer solution 0.5 mg  0.5 mg Nebulization BID Flora Lipps, MD   0.5 mg at 01/19/20 0750  . cefTRIAXone (ROCEPHIN) 1 g in sodium chloride 0.9 % 100 mL IVPB  1 g Intravenous Q24H Rust-Chester, Britton L, NP 200 mL/hr at 01/18/20 0641 1 g at 01/18/20 0641  . chlorhexidine gluconate (MEDLINE KIT) (PERIDEX) 0.12 % solution 15 mL  15 mL Mouth Rinse BID Flora Lipps, MD   15 mL at 01/19/20 0944  . Chlorhexidine Gluconate Cloth 2 % PADS 6 each  6 each Topical V0350 Flora Lipps, MD   6 each at 01/19/20 0525  . dexmedetomidine  (PRECEDEX) 400 MCG/100ML (4 mcg/mL) infusion  0.4-1.2 mcg/kg/hr Intravenous Titrated Tyler Pita, MD 17.88 mL/hr at 01/17/20 1207 0.6 mcg/kg/hr at 01/17/20 1207  . docusate (COLACE) 50 MG/5ML liquid 100 mg  100 mg Per Tube BID Flora Lipps, MD   100 mg at 01/19/20 0943  . feeding supplement (PROSource TF) liquid 90 mL  90 mL Per Tube BID Flora Lipps, MD   90 mL at 01/19/20 0944  . feeding supplement (VITAL HIGH PROTEIN) liquid 1,000 mL  1,000 mL Per Tube Q24H Flora Lipps, MD   1,000 mL at 01/19/20 1227  . fentaNYL (SUBLIMAZE) bolus via infusion 50 mcg  50 mcg Intravenous Q15 min PRN Flora Lipps, MD   50 mcg at 01/15/20 0033  . fentaNYL 2520mg in NS 2568m(1062mml) infusion-PREMIX  0-100 mcg/hr Intravenous Continuous BasRosine DoorD 10 mL/hr at 01/19/20 0353 100 mcg/hr at 01/19/20 0353  . free water 200 mL  200 mL Per Tube Q4H GonTyler PitaD   200 mL at 01/19/20 1227  . insulin aspart (novoLOG) injection 0-20 Units  0-20 Units Subcutaneous TID WC OrtReubin MilanD   3 Units at 01/17/20 1805  . ipratropium-albuterol (DUONEB) 0.5-2.5 (3) MG/3ML nebulizer solution 3 mL  3 mL Nebulization Q6H GonTyler PitaD   3 mL at 01/19/20 1440  . MEDLINE mouth rinse  15 mL Mouth Rinse 10  times per day Flora Lipps, MD   15 mL at 01/19/20 1451  . metoCLOPramide (REGLAN) injection 5 mg  5 mg Intravenous Q8H Tyler Pita, MD   5 mg at 01/19/20 1451  . metoprolol tartrate (LOPRESSOR) injection 5 mg  5 mg Intravenous Q6H PRN Ottie Glazier, MD   5 mg at 01/18/20 0207  . midazolam (VERSED) injection 1 mg  1 mg Intravenous Q2H PRN Rosine Door, MD      . multivitamin with minerals tablet 1 tablet  1 tablet Per Tube Daily Ottie Glazier, MD   1 tablet at 01/19/20 0943  . ondansetron (ZOFRAN) tablet 4 mg  4 mg Per Tube Q6H PRN Flora Lipps, MD       Or  . ondansetron (ZOFRAN) injection 4 mg  4 mg Intravenous Q6H PRN Flora Lipps, MD   4 mg at 01/17/20 1804  . pantoprazole  (PROTONIX) injection 40 mg  40 mg Intravenous Q24H Rust-Chester, Britton L, NP   40 mg at 01/19/20 0944  . polyethylene glycol (MIRALAX / GLYCOLAX) packet 17 g  17 g Per Tube Daily Flora Lipps, MD   17 g at 01/19/20 0943  . promethazine (PHENERGAN) injection 12.5-25 mg  12.5-25 mg Intravenous Q6H PRN Rust-Chester, Britton L, NP   25 mg at 01/18/20 0249  . sennosides (SENOKOT) 8.8 MG/5ML syrup 5 mL  5 mL Per Tube BID Flora Lipps, MD   5 mL at 01/19/20 0944  .  Medications Prior to Admission  Medication Sig Dispense Refill  . albuterol (VENTOLIN HFA) 108 (90 Base) MCG/ACT inhaler Inhale 1-2 puffs into the lungs every 4 (four) hours as needed for wheezing or shortness of breath.    Marland Kitchen apixaban (ELIQUIS) 5 MG TABS tablet Take 5 mg by mouth 2 (two) times daily.    Marland Kitchen aspirin 81 MG EC tablet Take 81 mg by mouth at bedtime.     Marland Kitchen atorvastatin (LIPITOR) 40 MG tablet Take 40 mg by mouth at bedtime.     . Fluticasone-Salmeterol (ADVAIR) 250-50 MCG/DOSE AEPB Inhale 1 puff into the lungs 2 (two) times daily.    . furosemide (LASIX) 80 MG tablet Take 80 mg by mouth 2 (two) times daily.    . insulin aspart (NOVOLOG) 100 UNIT/ML injection Inject 5-15 Units into the skin 3 (three) times daily before meals.     . insulin detemir (LEVEMIR) 100 UNIT/ML injection Inject 40-50 Units into the skin at bedtime.     Marland Kitchen ipratropium-albuterol (DUONEB) 0.5-2.5 (3) MG/3ML SOLN Inhale 3 mLs into the lungs 4 (four) times daily as needed for wheezing.    . isosorbide dinitrate (ISORDIL) 10 MG tablet Take 1 tablet (10 mg total) by mouth 3 (three) times daily. 270 tablet 3  . lisinopril (ZESTRIL) 30 MG tablet Take 30 mg by mouth at bedtime.     . Melatonin 5 MG TABS Take 5 mg by mouth at bedtime as needed for sleep.    . metoprolol tartrate (LOPRESSOR) 100 MG tablet Take 1 tablet (100 mg total) by mouth 2 (two) times daily. (Patient taking differently: Take 100 mg by mouth at bedtime. ) 60 tablet 2  . potassium chloride SA  (KLOR-CON) 20 MEQ tablet Take 20 mEq by mouth at bedtime.     . theophylline (THEODUR) 300 MG 12 hr tablet Take 300 mg by mouth 2 (two) times daily.     Marland Kitchen tiotropium (SPIRIVA) 18 MCG inhalation capsule Place 18 mcg into inhaler and inhale daily.  Allergies:  Allergies  Allergen Reactions  . Penicillins Anaphylaxis and Other (See Comments)    Has patient had a PCN reaction causing immediate rash, facial/tongue/throat swelling, SOB or lightheadedness with hypotension: Yes Has patient had a PCN reaction causing severe rash involving mucus membranes or skin necrosis: No Has patient had a PCN reaction that required hospitalization: No Has patient had a PCN reaction occurring within the last 10 years: No If all of the above answers are "NO", then may proceed with Cephalosporin use.     PMH:  Past Medical History:  Diagnosis Date  . CHF (congestive heart failure) (HCC)    EF 45-50%- 2020  . CKD (chronic kidney disease) stage 3, GFR 30-59 ml/min (HCC)   . Clear cell renal cell carcinoma (HCC)    s/p partial nephrectomy  . COPD (chronic obstructive pulmonary disease) ([place])   . Coronary artery disease   . Diabetes mellitus without complication ([place])   . Hypercholesteremia unk  . Hypertension   . Myocardial infarction ([place])   . Sarcoidosis   . Sleep apnea    does not use CPAP regularly.  . Stroke ([place])     Fam Hx:  Family History  Problem Relation Age of Onset  . Hypertension Mother   . Heart disease Mother   . Diabetes Mother   . Cancer Father     Soc Hx:  Social History   Socioeconomic History  . Marital status: Married    Spouse name: Not on file  . Number of children: Not on file  . Years of education: Not on file  . Highest education level: Not on file  Occupational History  . Occupation: disabled  Tobacco Use  . Smoking status: Current Every Day Smoker    Packs/day: 0.25    Years: 25.00    Pack years: 6.25    Types: Cigarettes    Last attempt to quit:  02/05/2015    Years since quitting: 4.9  . Smokeless tobacco: Never Used  Vaping Use  . Vaping Use: Never used  Substance and Sexual Activity  . Alcohol use: Not Currently    Alcohol/week: 0.0 standard drinks    Comment: occassional  . Drug use: Yes    Types: Marijuana, PCP    Comment: 4 weeks  . Sexual activity: Yes  Other Topics Concern  . Not on file  Social History Narrative  . Not on file   Social Determinants of Health   Financial Resource Strain: Not on file  Food Insecurity: Not on file  Transportation Needs: Not on file  Physical Activity: Not on file  Stress: Not on file  Social Connections: Not on file  Intimate Partner Violence: Not on file    PSH:  Past Surgical History:  Procedure Laterality Date  . APPENDECTOMY    . PARTIAL NEPHRECTOMY    . TRACHEOSTOMY TUBE PLACEMENT N/A 01/14/2020   Procedure: TRACHEOSTOMY;  Surgeon: Clyde Canterbury, MD;  Location: [facility] ORS;  Service: ENT;  Laterality: N/A;  . Procedures since admission: No admission procedures for hospital encounter.  ROS: Review of systems normal other than 12 systems except per HPI.  PHYSICAL EXAM  Vitals: Blood pressure (!) 141/80, pulse (!) 110, temperature 99.86 F (37.7 C), resp. rate (!) 33, height 6' 0.99" (1.854 m), weight 125.7 kg, SpO2 100 %.. General: Well-developed, Well-nourished in no acute distress, on vent Mood: Mood and affect well adjusted, pleasant and cooperative. head and Face: NCAT. No facial asymmetry. No visible skin lesions. No significant  facial scars. No tenderness with sinus percussion. Facial strength normal and symmetric. Neck: Supple and symmetric with no palpable masses, tenderness or crepitance. The trach tube is in place with old clot inferiorly and to left. This was removed and wound suctioned and noted to have non-arterial bleeding from the superficial aspect of the wound. Lymphatic: Cervical lymph nodes are without palpable lymphadenopathy or tenderness.  MEDICAL  DECISION MAKING: Data Review:  Results for orders placed or performed during the hospital encounter of 01/01/20 (from the past 48 hour(s))  Glucose, capillary     Status: Abnormal   Collection Time: 01/17/20  4:44 PM  Result Value Ref Range   Glucose-Capillary 149 (H) 70 - 99 mg/dL    Comment: Glucose reference range applies only to samples taken after fasting for at least 8 hours.  Glucose, capillary     Status: Abnormal   Collection Time: 01/17/20  7:44 PM  Result Value Ref Range   Glucose-Capillary 137 (H) 70 - 99 mg/dL    Comment: Glucose reference range applies only to samples taken after fasting for at least 8 hours.  Glucose, capillary     Status: Abnormal   Collection Time: 01/18/20 12:43 AM  Result Value Ref Range   Glucose-Capillary 136 (H) 70 - 99 mg/dL    Comment: Glucose reference range applies only to samples taken after fasting for at least 8 hours.  Glucose, capillary     Status: Abnormal   Collection Time: 01/18/20  4:31 AM  Result Value Ref Range   Glucose-Capillary 127 (H) 70 - 99 mg/dL    Comment: Glucose reference range applies only to samples taken after fasting for at least 8 hours.  CBC with Differential/Platelet     Status: Abnormal   Collection Time: 01/18/20  4:35 AM  Result Value Ref Range   WBC 14.9 (H) 4.0 - 10.5 K/uL   RBC 4.42 4.22 - 5.81 MIL/uL   Hemoglobin 12.4 (L) 13.0 - 17.0 g/dL   HCT 40.4 39.0 - 52.0 %   MCV 91.4 80.0 - 100.0 fL   MCH 28.1 26.0 - 34.0 pg   MCHC 30.7 30.0 - 36.0 g/dL   RDW 12.3 11.5 - 15.5 %   Platelets 247 150 - 400 K/uL   nRBC 0.0 0.0 - 0.2 %   Neutrophils Relative % 88 %   Neutro Abs 13.1 (H) 1.7 - 7.7 K/uL   Lymphocytes Relative 4 %   Lymphs Abs 0.6 (L) 0.7 - 4.0 K/uL   Monocytes Relative 8 %   Monocytes Absolute 1.1 (H) 0.1 - 1.0 K/uL   Eosinophils Relative 0 %   Eosinophils Absolute 0.0 0.0 - 0.5 K/uL   Basophils Relative 0 %   Basophils Absolute 0.0 0.0 - 0.1 K/uL   Immature Granulocytes 0 %   Abs Immature  Granulocytes 0.05 0.00 - 0.07 K/uL    Comment: Performed at [facility], [address] [place], [place] [address]  Basic metabolic panel     Status: Abnormal   Collection Time: 01/18/20  4:35 AM  Result Value Ref Range   Sodium 145 135 - 145 mmol/L   Potassium 4.8 3.5 - 5.1 mmol/L   Chloride 105 98 - 111 mmol/L   CO2 33 (H) 22 - 32 mmol/L   Glucose, Bld 141 (H) 70 - 99 mg/dL    Comment: Glucose reference range applies only to samples taken after fasting for at least 8 hours.   BUN 37 (H) 6 - 20 mg/dL  Creatinine, Ser 1.14 0.61 - 1.24 mg/dL   Calcium 9.2 8.9 - 10.3 mg/dL   GFR, Estimated >60 >60 mL/min    Comment: (NOTE) Calculated using the CKD-EPI Creatinine Equation (2021)    Anion gap 7 5 - 15    Comment: Performed at [facility], [address] [place], [place] [address]  Magnesium     Status: None   Collection Time: 01/18/20  4:35 AM  Result Value Ref Range   Magnesium 2.2 1.7 - 2.4 mg/dL    Comment: Performed at [facility], [address], [place], [place] [address]  Phosphorus     Status: None   Collection Time: 01/18/20  4:35 AM  Result Value Ref Range   Phosphorus 3.7 2.5 - 4.6 mg/dL    Comment: Performed at [facility], [address] [place], [place] [address]  APTT     Status: Abnormal   Collection Time: 01/18/20  4:35 AM  Result Value Ref Range   aPTT 46 (H) 24 - 36 seconds    Comment:        IF BASELINE aPTT IS ELEVATED, SUGGEST PATIENT RISK ASSESSMENT BE USED TO DETERMINE APPROPRIATE ANTICOAGULANT THERAPY. Performed at [facility], [place], [place] [address]   Heparin level (unfractionated)     Status: Abnormal   Collection Time: 01/18/20  4:35 AM  Result Value Ref Range   Heparin Unfractionated 0.89 (H) 0.30 - 0.70 IU/mL    Comment: (NOTE) If heparin results are below expected values, and patient dosage has  been confirmed, suggest follow up testing of antithrombin III  levels. Performed at [facility], [address] [place], [place] [address]   Procalcitonin - Baseline     Status: None   Collection Time: 01/18/20  4:35 AM  Result Value Ref Range   Procalcitonin 0.38 ng/mL    Comment:        Interpretation: PCT (Procalcitonin) <= 0.5 ng/mL: Systemic infection (sepsis) is not likely. Local bacterial infection is possible. (NOTE)       Sepsis PCT Algorithm           Lower Respiratory Tract                                      Infection PCT Algorithm    ----------------------------     ----------------------------         PCT < 0.25 ng/mL                PCT < 0.10 ng/mL          Strongly encourage             Strongly discourage   discontinuation of antibiotics    initiation of antibiotics    ----------------------------     -----------------------------       PCT 0.25 - 0.50 ng/mL            PCT 0.10 - 0.25 ng/mL               OR       >80% decrease in PCT            Discourage initiation of  antibiotics      Encourage discontinuation           of antibiotics    ----------------------------     -----------------------------         PCT >= 0.50 ng/mL              PCT 0.26 - 0.50 ng/mL               AND        <80% decrease in PCT             Encourage initiation of                                             antibiotics       Encourage continuation           of antibiotics    ----------------------------     -----------------------------        PCT >= 0.50 ng/mL                  PCT > 0.50 ng/mL               AND         increase in PCT                  Strongly encourage                                      initiation of antibiotics    Strongly encourage escalation           of antibiotics                                     -----------------------------                                           PCT <= 0.25 ng/mL                                                 OR                                         > 80% decrease in PCT                                      Discontinue / Do not initiate                                             antibiotics  Performed at [facility], [address] [place], [place] [address]   Glucose, capillary     Status: Abnormal   Collection Time:  01/18/20  7:51 AM  Result Value Ref Range   Glucose-Capillary 110 (H) 70 - 99 mg/dL    Comment: Glucose reference range applies only to samples taken after fasting for at least 8 hours.  Glucose, capillary     Status: None   Collection Time: 01/18/20 11:50 AM  Result Value Ref Range   Glucose-Capillary 97 70 - 99 mg/dL    Comment: Glucose reference range applies only to samples taken after fasting for at least 8 hours.  APTT     Status: Abnormal   Collection Time: 01/18/20 12:43 PM  Result Value Ref Range   aPTT 142 (H) 24 - 36 seconds    Comment:        IF BASELINE aPTT IS ELEVATED, SUGGEST PATIENT RISK ASSESSMENT BE USED TO DETERMINE APPROPRIATE ANTICOAGULANT THERAPY. Performed at [facility], [address] [place], [place] [address]   Glucose, capillary     Status: Abnormal   Collection Time: 01/18/20  4:28 PM  Result Value Ref Range   Glucose-Capillary 106 (H) 70 - 99 mg/dL    Comment: Glucose reference range applies only to samples taken after fasting for at least 8 hours.  Glucose, capillary     Status: Abnormal   Collection Time: 01/18/20  7:59 PM  Result Value Ref Range   Glucose-Capillary 100 (H) 70 - 99 mg/dL    Comment: Glucose reference range applies only to samples taken after fasting for at least 8 hours.  APTT     Status: Abnormal   Collection Time: 01/18/20  9:00 PM  Result Value Ref Range   aPTT 67 (H) 24 - 36 seconds    Comment:        IF BASELINE aPTT IS ELEVATED, SUGGEST PATIENT RISK ASSESSMENT BE USED TO DETERMINE APPROPRIATE ANTICOAGULANT THERAPY. Performed at [facility], [address] [place], [place] [address]    Glucose, capillary     Status: Abnormal   Collection Time: 01/18/20 11:50 PM  Result Value Ref Range   Glucose-Capillary 112 (H) 70 - 99 mg/dL    Comment: Glucose reference range applies only to samples taken after fasting for at least 8 hours.  Hemoglobin and hematocrit, blood     Status: Abnormal   Collection Time: 01/19/20  2:37 AM  Result Value Ref Range   Hemoglobin 12.0 (L) 13.0 - 17.0 g/dL   HCT 38.8 (L) 39.0 - 52.0 %    Comment: Performed at [facility], [address] [place], [place] [address]  Glucose, capillary     Status: Abnormal   Collection Time: 01/19/20  3:50 AM  Result Value Ref Range   Glucose-Capillary 111 (H) 70 - 99 mg/dL    Comment: Glucose reference range applies only to samples taken after fasting for at least 8 hours.  Procalcitonin     Status: None   Collection Time: 01/19/20  6:01 AM  Result Value Ref Range   Procalcitonin 0.48 ng/mL    Comment:        Interpretation: PCT (Procalcitonin) <= 0.5 ng/mL: Systemic infection (sepsis) is not likely. Local bacterial infection is possible. (NOTE)       Sepsis PCT Algorithm           Lower Respiratory Tract                                      Infection PCT Algorithm    ----------------------------     ----------------------------  PCT < 0.25 ng/mL                PCT < 0.10 ng/mL          Strongly encourage             Strongly discourage   discontinuation of antibiotics    initiation of antibiotics    ----------------------------     -----------------------------       PCT 0.25 - 0.50 ng/mL            PCT 0.10 - 0.25 ng/mL               OR       >80% decrease in PCT            Discourage initiation of                                            antibiotics      Encourage discontinuation           of antibiotics    ----------------------------     -----------------------------         PCT >= 0.50 ng/mL              PCT 0.26 - 0.50 ng/mL               AND        <80% decrease in PCT              Encourage initiation of                                             antibiotics       Encourage continuation           of antibiotics    ----------------------------     -----------------------------        PCT >= 0.50 ng/mL                  PCT > 0.50 ng/mL               AND         increase in PCT                  Strongly encourage                                      initiation of antibiotics    Strongly encourage escalation           of antibiotics                                     -----------------------------                                           PCT <= 0.25 ng/mL  OR                                        > 80% decrease in PCT                                      Discontinue / Do not initiate                                             antibiotics  Performed at [facility], [place], [place] [address]   Heparin level (unfractionated)     Status: None   Collection Time: 01/19/20  6:01 AM  Result Value Ref Range   Heparin Unfractionated 0.36 0.30 - 0.70 IU/mL    Comment: (NOTE) If heparin results are below expected values, and patient dosage has  been confirmed, suggest follow up testing of antithrombin III levels. Performed at [facility], [address] [place], [place] [address]   APTT     Status: None   Collection Time: 01/19/20  6:01 AM  Result Value Ref Range   aPTT 34 24 - 36 seconds    Comment: Performed at [facility], [address] [place], [place] [address]  CBC     Status: Abnormal   Collection Time: 01/19/20  6:01 AM  Result Value Ref Range   WBC 12.0 (H) 4.0 - 10.5 K/uL   RBC 4.19 (L) 4.22 - 5.81 MIL/uL   Hemoglobin 11.8 (L) 13.0 - 17.0 g/dL   HCT 38.6 (L) 39.0 - 52.0 %   MCV 92.1 80.0 - 100.0 fL   MCH 28.2 26.0 - 34.0 pg   MCHC 30.6 30.0 - 36.0 g/dL   RDW 12.4 11.5 - 15.5 %   Platelets 251 150 - 400 K/uL   nRBC 0.0 0.0 - 0.2 %     Comment: Performed at [facility], [address] [place],  [address]  Glucose, capillary     Status: Abnormal   Collection Time: 01/19/20  7:47 AM  Result Value Ref Range   Glucose-Capillary 103 (H) 70 - 99 mg/dL    Comment: Glucose reference range applies only to samples taken after fasting for at least 8 hours.  Glucose, capillary     Status: None   Collection Time: 01/19/20 11:15 AM  Result Value Ref Range   Glucose-Capillary 97 70 - 99 mg/dL    Comment: Glucose reference range applies only to samples taken after fasting for at least 8 hours.  Darletta Moll Abd 1 View  Result Date: 01/17/2020 CLINICAL DATA:  Vomiting EXAM: ABDOMEN - 1 VIEW COMPARISON:  01/15/2020 FINDINGS: NG tube is in the stomach. Mild diffuse gaseous distention of bowel. No organomegaly or free air. IMPRESSION: Mild diffuse gaseous distention of bowel suggest mild ileus. NG tube in the stomach. Electronically Signed   By: Rolm Baptise M.D.   On: 01/17/2020 20:39   DG Chest Port 1 View  Result Date: 01/17/2020 CLINICAL DATA:  Respiratory failure EXAM: PORTABLE CHEST 1 VIEW COMPARISON:  01/16/2020 FINDINGS: Tracheostomy remains in place, unchanged. NG tube is in the stomach. Heart is normal size. Linear densities in both upper lobes and lower  lobes could reflect atelectasis or scarring. No effusions or pneumothorax. IMPRESSION: Areas of atelectasis or scarring bilaterally. Electronically Signed   By: Rolm Baptise M.D.   On: 01/17/2020 20:39  .   ASSESSMENT: Bleeding from trach wound appears to be superficial oozing from left side of wound PLAN: Packed area with Surgicel and placed trach dressing to hold this in place. This appeared to control bleeding easily. Dr. Mortimer Fries is going to hold his heparin until tomorrow.    Corey Nearing, MD 01/19/2020 3:09 PM

## 2020-01-20 LAB — CBC WITH DIFFERENTIAL/PLATELET
Abs Immature Granulocytes: 0.04 10*3/uL (ref 0.00–0.07)
Basophils Absolute: 0 10*3/uL (ref 0.0–0.1)
Basophils Relative: 0 %
Eosinophils Absolute: 0.1 10*3/uL (ref 0.0–0.5)
Eosinophils Relative: 1 %
HCT: 37.9 % — ABNORMAL LOW (ref 39.0–52.0)
Hemoglobin: 11.5 g/dL — ABNORMAL LOW (ref 13.0–17.0)
Immature Granulocytes: 0 %
Lymphocytes Relative: 8 %
Lymphs Abs: 0.8 10*3/uL (ref 0.7–4.0)
MCH: 27.9 pg (ref 26.0–34.0)
MCHC: 30.3 g/dL (ref 30.0–36.0)
MCV: 92 fL (ref 80.0–100.0)
Monocytes Absolute: 1.3 10*3/uL — ABNORMAL HIGH (ref 0.1–1.0)
Monocytes Relative: 12 %
Neutro Abs: 8 10*3/uL — ABNORMAL HIGH (ref 1.7–7.7)
Neutrophils Relative %: 79 %
Platelets: 265 10*3/uL (ref 150–400)
RBC: 4.12 MIL/uL — ABNORMAL LOW (ref 4.22–5.81)
RDW: 12.6 % (ref 11.5–15.5)
WBC: 10.2 10*3/uL (ref 4.0–10.5)
nRBC: 0 % (ref 0.0–0.2)

## 2020-01-20 LAB — PROCALCITONIN: Procalcitonin: 0.31 ng/mL

## 2020-01-20 LAB — BASIC METABOLIC PANEL
Anion gap: 9 (ref 5–15)
BUN: 33 mg/dL — ABNORMAL HIGH (ref 6–20)
CO2: 32 mmol/L (ref 22–32)
Calcium: 9.1 mg/dL (ref 8.9–10.3)
Chloride: 108 mmol/L (ref 98–111)
Creatinine, Ser: 1.21 mg/dL (ref 0.61–1.24)
GFR, Estimated: >60 mL/min (ref >60)
Glucose, Bld: 112 mg/dL — ABNORMAL HIGH (ref 70–99)
Potassium: 4 mmol/L (ref 3.5–5.1)
Sodium: 149 mmol/L — ABNORMAL HIGH (ref 135–145)

## 2020-01-20 LAB — PHOSPHORUS: Phosphorus: 3.6 mg/dL (ref 2.5–4.6)

## 2020-01-20 LAB — HEPARIN LEVEL (UNFRACTIONATED): Heparin Unfractionated: 0.59 IU/mL (ref 0.30–0.70)

## 2020-01-20 LAB — CULTURE, RESPIRATORY W GRAM STAIN: Culture: NORMAL

## 2020-01-20 LAB — GLUCOSE, CAPILLARY
Glucose-Capillary: 116 mg/dL — ABNORMAL HIGH (ref 70–99)
Glucose-Capillary: 103 mg/dL — ABNORMAL HIGH (ref 70–99)
Glucose-Capillary: 121 mg/dL — ABNORMAL HIGH (ref 70–99)
Glucose-Capillary: 98 mg/dL (ref 70–99)
Glucose-Capillary: 134 mg/dL — ABNORMAL HIGH (ref 70–99)
Glucose-Capillary: 136 mg/dL — ABNORMAL HIGH (ref 70–99)

## 2020-01-20 LAB — MAGNESIUM: Magnesium: 2.4 mg/dL (ref 1.7–2.4)

## 2020-01-20 MED ORDER — VITAL HIGH PROTEIN PO LIQD
1000.0000 mL | ORAL | Status: DC
  Administered 2020-01-20 – 2020-01-24 (×4): 1000 mL

## 2020-01-20 MED ORDER — HEPARIN (PORCINE) 25000 UT/250ML-% IV SOLN
1700.0000 [IU]/h | INTRAVENOUS | Status: AC
  Administered 2020-01-20 – 2020-01-21 (×3): 1700 [IU]/h via INTRAVENOUS
  Filled 2020-01-20 (×3): qty 250

## 2020-01-20 NOTE — Progress Notes (Signed)
Patient was in distress, resumed PRVC mode to rest patient.

## 2020-01-20 NOTE — Progress Notes (Signed)
[place] for Heparin Infusion Indication: atrial fibrillation  Allergies  Allergen Reactions  . Penicillins Anaphylaxis and Other (See Comments)    Has patient had a PCN reaction causing immediate rash, facial/tongue/throat swelling, SOB or lightheadedness with hypotension: Yes Has patient had a PCN reaction causing severe rash involving mucus membranes or skin necrosis: No Has patient had a PCN reaction that required hospitalization: No Has patient had a PCN reaction occurring within the last 10 years: No If all of the above answers are "NO", then may proceed with Cephalosporin use.     Patient Measurements: Height: 6' 0.99" (185.4 cm) Weight: 125.7 kg (277 lb 1.9 oz) IBW/kg (Calculated) : 79.88 Heparin Dosing Weight: 107.7 kg  Vital Signs: Temp: 99.5 F (37.5 C) (12/15 1100) Temp Source: Bladder (12/15 0600) BP: 145/94 (12/15 1100) Pulse Rate: 99 (12/15 1100)  Labs: Recent Labs    01/18/20 0435 01/18/20 1243 01/18/20 2100 01/19/20 0237 01/19/20 0601 01/20/20 0508  HGB 12.4*  --   --  12.0* 11.8* 11.5*  HCT 40.4  --   --  38.8* 38.6* 37.9*  PLT 247  --   --   --  251 265  APTT 46* 142* 67*  --  34  --   HEPARINUNFRC 0.89*  --   --   --  0.36  --   CREATININE 1.14  --   --   --   --  1.21    Estimated Creatinine Clearance: 102.6 mL/min (by C-G formula based on SCr of 1.21 mg/dL).   Medical History: Past Medical History:  Diagnosis Date  . CHF (congestive heart failure) (HCC)    EF 45-50%- 2020  . CKD (chronic kidney disease) stage 3, GFR 30-59 ml/min (HCC)   . Clear cell renal cell carcinoma (HCC)    s/p partial nephrectomy  . COPD (chronic obstructive pulmonary disease) ([place])   . Coronary artery disease   . Diabetes mellitus without complication ([place])   . Hypercholesteremia unk  . Hypertension   . Myocardial infarction ([place])   . Sarcoidosis   . Sleep apnea    does not use CPAP regularly.  . Stroke [facility])      Medications:   Apixaban 5mg  - Last dose 12/12 @ 0919  Assessment 12/15 Bleeding at trach site deemed superficial, okay to resume heparin gtt per MD.   Goal of Therapy:   Heparin level 0.3-0.7 units/ml aPTT 66-102 seconds Monitor platelets by anticoagulation protocol: Yes   12/13 @ 0435 aPTT 46 Subtherapeutic, HL 0.89, bolus 3000 units x1, increased infusion rate to 1950 units/hr. 12/13 @ 1243 aPTT 142 (supratherapeutic), will hold x 1 hr, then decrease rate to 1650 units/hr 12/13 @ 2100 aPTT 67 (therapeutic) on lower end, increaseed heparin to 1700 units/hr  12/14 @ 0601 aPTT 34 Subtherapeutic, HL 0.36, therapeutic -- Heparin infusion off since 0253 AM due to bleeding at trach site    Plan:   Resume heparin gtt at last therapeutic dose of 1700 units/hr without bolus.   Check heparin level 6 hours following initiation  CBC daily   Virl Cagey, PharmD, BCPS 01/20/2020 12:21 PM

## 2020-01-20 NOTE — Psychosocial Assessment (Signed)
Had started the increased rate of tube feeds (57ml/hr) and a few minutes later patient started complaining of belly pain- so I decreased the rate to 60ml/hr from the start of shift of 22ml/hr.  Patient resting at this time.

## 2020-01-20 NOTE — Progress Notes (Signed)
[place] for Heparin Infusion Indication: atrial fibrillation  Allergies  Allergen Reactions  . Penicillins Anaphylaxis and Other (See Comments)    Has patient had a PCN reaction causing immediate rash, facial/tongue/throat swelling, SOB or lightheadedness with hypotension: Yes Has patient had a PCN reaction causing severe rash involving mucus membranes or skin necrosis: No Has patient had a PCN reaction that required hospitalization: No Has patient had a PCN reaction occurring within the last 10 years: No If all of the above answers are "NO", then may proceed with Cephalosporin use.     Patient Measurements: Height: 6' 0.99" (185.4 cm) Weight: 125.7 kg (277 lb 1.9 oz) IBW/kg (Calculated) : 79.88 Heparin Dosing Weight: 107.7 kg  Vital Signs: Temp: 98.96 F (37.2 C) (12/15 1800) BP: 157/92 (12/15 1800) Pulse Rate: 107 (12/15 1800)  Labs: Recent Labs    01/18/20 0435 01/18/20 1243 01/18/20 2100 01/19/20 0237 01/19/20 0601 01/20/20 0508 01/20/20 2028  HGB 12.4*  --   --  12.0* 11.8* 11.5*  --   HCT 40.4  --   --  38.8* 38.6* 37.9*  --   PLT 247  --   --   --  251 265  --   APTT 46* 142* 67*  --  34  --   --   HEPARINUNFRC 0.89*  --   --   --  0.36  --  0.59  CREATININE 1.14  --   --   --   --  1.21  --     Estimated Creatinine Clearance: 102.6 mL/min (by C-G formula based on SCr of 1.21 mg/dL).   Medical History: Past Medical History:  Diagnosis Date  . CHF (congestive heart failure) (HCC)    EF 45-50%- 2020  . CKD (chronic kidney disease) stage 3, GFR 30-59 ml/min (HCC)   . Clear cell renal cell carcinoma (HCC)    s/p partial nephrectomy  . COPD (chronic obstructive pulmonary disease) ([place])   . Coronary artery disease   . Diabetes mellitus without complication ([place])   . Hypercholesteremia unk  . Hypertension   . Myocardial infarction ([place])   . Sarcoidosis   . Sleep apnea    does not use CPAP regularly.  . Stroke  [facility])     Medications:   Apixaban 5mg  - Last dose 12/12 @ 0919  Assessment 12/15 Bleeding at trach site deemed superficial, okay to resume heparin gtt per MD.   Goal of Therapy:   Heparin level 0.3-0.7 units/ml aPTT 66-102 seconds Monitor platelets by anticoagulation protocol: Yes   12/13 @ 0435 aPTT 46 Subtherapeutic, HL 0.89, bolus 3000 units x1, increased infusion rate to 1950 units/hr. 12/13 @ 1243 aPTT 142 (supratherapeutic), will hold x 1 hr, then decrease rate to 1650 units/hr 12/13 @ 2100 aPTT 67 (therapeutic) on lower end, increaseed heparin to 1700 units/hr  12/14 @ 0601 aPTT 34 Subtherapeutic, HL 0.36, therapeutic -- Heparin infusion off since 0253 AM due to bleeding at trach site 12/15 2028 HL 0.59     Plan:   Continue heparin at 1700 units/hr.   Check heparin level 6 hours following initiation  CBC daily   Eleonore Chiquito, PharmD, BCPS 01/20/2020 9:23 PM

## 2020-01-20 NOTE — Evaluation (Signed)
Occupational Therapy Evaluation Patient Details Name: [name] MRN: [id number] DOB: [birth year] Today's Date: 01/20/2020    History of Present Illness Pt admitted for acute resp failure. History includes severe COPD and sarcoidosis. Of note, stay complicated by intubation from 11/27-11/29; then reintubated 11/29-12/9/21. Chronic O2 on 2L of O2 at baseline. Pt with NG tube and trach.   Clinical Impression   [name] was seen for OT evaluation this date. Prior to hospital admission, pt was active and independent. He endorses using 2L of O2 at baseline, but denies use of AE for functional mobility and denies falls history in past year. Pt lives with his spouse in a 1 level home, further PLOF difficult to obtain 2/2 tracheostomy. Currently pt demonstrates significant functional impairments as described below (See OT problem list) which functionally limit his ability to perform ADL/self-care tasks. Pt currently requires TOTAL Assist for bed-level toileting, bathing and dressing tasks. At time of OT evaluation he is unsafe/unable to perform functional mobility, however anticipate heavy +2 assist or lift dependent transfers required 2/2 significant generalized weakness and poor activity tolerance.  Pt would benefit from skilled OT services to address noted impairments and functional limitations (see below for any additional details) in order to maximize safety and independence while minimizing falls risk and caregiver burden. Upon hospital discharge, recommend LTACH.     Follow Up Recommendations  LTACH    Equipment Recommendations   (TBD)    Recommendations for Other Services       Precautions / Restrictions Precautions Precautions: Fall Precaution Comments: Trach, NG tube Restrictions Weight Bearing Restrictions: No      Mobility Bed Mobility Overal bed mobility: Needs Assistance             General bed mobility comments: pt able to adjust trunk in bed, unable to roll or  perform further mobility at this time.    Transfers                 General transfer comment: unsafe to perform    Balance Overall balance assessment: Needs assistance                                         ADL either performed or assessed with clinical judgement   ADL Overall ADL's : Needs assistance/impaired                                       General ADL Comments: Pt significantly functionally limited by generalized weakness and decreased activity tolerance. Currently total A for bed level toileting/peri-care/dressing tasks. Anticipate heavy +2 assist (or lift) for functional transfers.     Vision Baseline Vision/History: Wears glasses Wears Glasses: At all times Patient Visual Report: No change from baseline       Perception     Praxis      Pertinent Vitals/Pain Pain Assessment: No/denies pain (Monitored during session.)     Hand Dominance Right   Extremity/Trunk Assessment Upper Extremity Assessment Upper Extremity Assessment: Generalized weakness (B shoulder grossly 2/5; LUE grossly 2/5 t/o with minimal activation of bicep/tricep palpated. R elbow 3/5; grips symmetrical 3/5)   Lower Extremity Assessment Lower Extremity Assessment: Generalized weakness;Defer to PT evaluation       Communication Communication Communication: Tracheostomy   Cognition Arousal/Alertness: Lethargic Behavior During Therapy: [facility] for  tasks assessed/performed Overall Cognitive Status: Difficult to assess                                 General Comments: Pt lethargic t/o session, intermittently closing eyes but is able to follow VCs when prompted.   General Comments  reports chronic numbness    Exercises Exercises: Other exercises Other Exercises Other Exercises: supine ther-ex performed on B LE including 5 reps of heel cord stretches, knee flexion, and hip abd/add. Limited to no active movement noted, mostly PROM Other  Exercises: Pt educated on role of OT in acute setting, positioning strategies to minimize BUE swelling, and supine BUE ther-ex for hand, wrist, elbow activation. Generally requires PROM to perform with limited AROM appreciated. Pt motivated to improve.   Shoulder Instructions      Home Living Family/patient expects to be discharged to:: Private residence Living Arrangements: Spouse/significant other Available Help at Discharge: Family Type of Home: House Home Access: Level entry     Home Layout: One [place]: Environmental consultant - 2 wheels;Wheelchair - manual   Additional Comments: History difficult to obtain due to trach. Unsure of accuracy      Prior Functioning/Environment Level of Independence: Independent        Comments: Pt reports using 2L O2 at baseline. Denies falls history in past year. +driving PTA.        OT Problem List: Decreased strength;Decreased coordination;Cardiopulmonary status limiting activity;Decreased range of motion;Decreased activity tolerance;Decreased safety awareness;Impaired balance (sitting and/or standing);Decreased knowledge of use of DME or AE;Impaired UE functional use      OT Treatment/Interventions: Self-care/ADL training;Therapeutic exercise;Therapeutic activities;DME and/or AE instruction;Patient/family education;Balance training;Energy conservation    OT Goals(Current goals can be found in the care plan section) Acute Rehab OT Goals Patient Stated Goal: to breathe easier OT Goal Formulation: With patient Time For Goal Achievement: 02/03/20 Potential to Achieve Goals: Good ADL Goals Pt Will Perform Grooming: sitting;with mod assist (c LRAD PRN for improved safety and functional indep.) Pt Will Perform Upper Body Dressing: sitting;with mod assist (c LRAD PRN for improved safety and functional indep.) Pt Will Perform Lower Body Dressing: with mod assist;sitting/lateral leans (+2 for safety; c LRAD PRN for improved  safety and functional indep.) Pt Will Transfer to Toilet: bedside commode;stand pivot transfer;with mod assist;with +2 assist (c LRAD PRN for improved safety and functional indep.) Pt Will Perform Toileting - Clothing Manipulation and hygiene: with adaptive equipment;with mod assist;sitting/lateral leans (c LRAD PRN for improved safety and functional indep.)  OT Frequency: Min 2X/week   Barriers to D/C: Decreased caregiver support;Inaccessible home environment          Co-evaluation              AM-PAC OT "6 Clicks" Daily Activity     Outcome Measure Help from another person eating meals?: A Lot (Based on function currently is on NG tube feedings.) Help from another person taking care of personal grooming?: A Lot Help from another person toileting, which includes using toliet, bedpan, or urinal?: Total Help from another person bathing (including washing, rinsing, drying)?: Total Help from another person to put on and taking off regular upper body clothing?: A Lot Help from another person to put on and taking off regular lower body clothing?: Total 6 Click Score: 9   End of Session Nurse Communication: Other (comment) (OT  facilitated positioning of BUE to maximize functional use and skin integrity.)  Activity Tolerance: Patient tolerated treatment well Patient left: in bed;with call bell/phone within reach;with bed alarm set  OT Visit Diagnosis: Other abnormalities of gait and mobility (R26.89);Muscle weakness (generalized) (M62.81)                Time: 7944-4619 OT Time Calculation (min): 22 min Charges:  OT General Charges $OT Visit: 1 Visit OT Evaluation $OT Eval High Complexity: 1 High OT Treatments $Therapeutic Exercise: 8-22 mins  Shara Blazing, M.S., OTR/L Ascom: [phone] 01/20/20, 4:17 PM

## 2020-01-20 NOTE — TOC Progression Note (Signed)
Transition of Care [facility]) - Progression Note    Patient Details  Name: [name] MRN: [id number] Date of Birth: 09-12-1970  [facility] CM/SW [place], [place] Phone Number:  [phone] 01/20/2020, 9:39 PM  Clinical Narrative:     CSw spoke with Danise Mina from [place] for update on placement.  Danise Mina stated she will reissue insurance auth on Friday to mitigate denial for [employer] authorization.  TOC will continue to follow.  Expected Discharge Plan: Long Term Acute Care (LTAC) Barriers to Discharge: Continued Medical Work up  Expected Discharge Plan and Services Expected Discharge Plan: Long Term Acute Care (LTAC) In-house Referral: Clinical Social Work   Post Acute Care Choice: Long Term Acute Care (LTAC) Living arrangements for the past 2 months: Single Family Home                                       Social Determinants of Health (SDOH) Interventions    Readmission Risk Interventions Readmission Risk Prevention Plan 11/19/2018 07/30/2018 07/29/2018  Transportation Screening Complete Complete Complete  PCP or Specialist Appt within 3-5 Days - Complete Complete  HRI or [place] - Complete Complete  Social Work Consult for [place] Planning/Counseling - - Patient refused  Palliative Care Screening - Not Applicable Not Applicable  Medication Review Press photographer) Referral to Pharmacy Complete Complete  PCP or Specialist appointment within 3-5 days of discharge Complete - -  [place] or Home Care Consult Patient refused - -  SW Recovery Care/Counseling Consult Complete - -  Palliative Care Screening Not Applicable - -  [place] Not Applicable - -  Some recent data might be hidden

## 2020-01-20 NOTE — Evaluation (Signed)
Physical Therapy Evaluation Patient Details Name: [name] MRN: [id number] DOB: Jun 07, 1970 Today's Date: 01/20/2020   History of Present Illness  Pt admitted for acute resp failure. History includes severe COPD and sarcoidosis. Of note, stay complicated by intubation from 11/27-11/29; then reintubated 11/29-12/9/21. Chronic O2 on 2L of O2 at baseline. Pt with NG tube and trach.  Clinical Impression  Pt is a pleasant 49 year old male who was admitted for acute respiratory failure. Cleared to participate with RN. Was off vent and just on trach this morning, however right when therapist entered room, RN placing pt back on vent for more support. Vitals altered, RN in room entire session and able to suction multiple times. Pt able to adjust self in bed with UE, however very limited movement with B LE. Pt demonstrates deficits with strength/mobility/endurance/pulmonary. HR at 127bpm and BP at 188/115. Increased RR noted with any mobility. Prior to mobility, pt very independent.  Would benefit from skilled PT to address above deficits and promote optimal return to PLOF. Currently recommending LTACH for further assistance for rehab.    Follow Up Recommendations LTACH    Equipment Recommendations   (TBD at next venue of care)    Recommendations for Other Services       Precautions / Restrictions Precautions Precautions: Fall Restrictions Weight Bearing Restrictions: No      Mobility  Bed Mobility Overal bed mobility: Needs Assistance             General bed mobility comments: pt able to adjust trunk in bed, unable to roll or perform further mobility at this time.    Transfers                 General transfer comment: unsafe to perform  Ambulation/Gait                Stairs            Wheelchair Mobility    Modified Rankin (Stroke Patients Only)       Balance                                             Pertinent Vitals/Pain  Pain Assessment: No/denies pain    Home Living Family/patient expects to be discharged to:: Private residence Living Arrangements: Spouse/significant other Available Help at Discharge: Family Type of Home: House Home Access: Level entry     Home Layout: One [place]: Environmental consultant - 2 wheels;Wheelchair - manual Additional Comments: History difficult to obtain due to trach. Unsure of accuracy    Prior Function Level of Independence: Independent               Hand Dominance        Extremity/Trunk Assessment   Upper Extremity Assessment Upper Extremity Assessment: Generalized weakness (B shoulder grossly 2/5; elbow 3/5; grips symmetrical 3/5)    Lower Extremity Assessment Lower Extremity Assessment: Generalized weakness (L LE grossly 2/5; DF 1/5; R LE grossly 3/5; DF 1/5)       Communication   Communication: Tracheostomy  Cognition Arousal/Alertness: Awake/alert Behavior During Therapy: WFL for tasks assessed/performed Overall Cognitive Status: Within Functional Limits for tasks assessed                                 General Comments: Pt oriented  to place/date, however has difficulty communicating due to trach.      General Comments General comments (skin integrity, edema, etc.): reports chronic numbness    Exercises Other Exercises Other Exercises: supine ther-ex performed on B LE including 5 reps of heel cord stretches, knee flexion, and hip abd/add. Limited to no active movement noted, mostly PROM   Assessment/Plan    PT Assessment Patient needs continued PT services  PT Problem List Decreased strength;Decreased activity tolerance;Decreased balance;Decreased mobility;Cardiopulmonary status limiting activity;Impaired sensation       PT Treatment Interventions DME instruction;Gait training;Therapeutic exercise;Therapeutic activities    PT Goals (Current goals can be found in the Care Plan section)  Acute Rehab PT Goals Patient  Stated Goal: to breathe easier PT Goal Formulation: With patient Time For Goal Achievement: 02/03/20 Potential to Achieve Goals: Fair    Frequency Min 2X/week   Barriers to discharge        Co-evaluation               AM-PAC PT "6 Clicks" Mobility  Outcome Measure Help needed turning from your back to your side while in a flat bed without using bedrails?: Total Help needed moving from lying on your back to sitting on the side of a flat bed without using bedrails?: Total Help needed moving to and from a bed to a chair (including a wheelchair)?: Total Help needed standing up from a chair using your arms (e.g., wheelchair or bedside chair)?: Total Help needed to walk in hospital room?: Total Help needed climbing 3-5 steps with a railing? : Total 6 Click Score: 6    End of Session Equipment Utilized During Treatment: Oxygen (trach & vent) Activity Tolerance: Treatment limited secondary to medical complications (Comment) Patient left: in bed;with nursing/sitter in room Nurse Communication: Mobility status PT Visit Diagnosis: Muscle weakness (generalized) (M62.81);Difficulty in walking, not elsewhere classified (R26.2)    Time: 2330-0762 PT Time Calculation (min) (ACUTE ONLY): 25 min   Charges:   PT Evaluation $PT Eval High Complexity: 1 High PT Treatments $Therapeutic Exercise: 8-22 mins        Greggory Stallion, PT, DPT [phone]   Cannie Muckle 01/20/2020, 2:39 PM

## 2020-01-20 NOTE — Progress Notes (Signed)
CRITICAL CARE NOTE 49 y.o.male.with medical history significant ofchronic combined systolic and diastolic CHF, COPD, CAD, history of MI, type 2 diabetes, hyperlipidemia, hypertension, sarcoidosis, sleep apnea not using Trilogy vent regularly, he has chronic hypercapnic respiratory failure due to end-stage lung disease.Previous h/o [facility] AND LTACH, admitted with acute on chronic mixed respiratory failure requiring intubation PCCM consulted.  11/27 ICU admission, INTUBATED #8ETT 11/28 severe resp failure, progressive Renal failure, Nephrology consulted 11/29 - patient passed SBT today, able to communicate/follow directions while intubated status post extubation to BIPAP - after 1 hour on BIPAP patient became unresponsive and required re-intubation. I have met with mother in law at bedside and reviewed careplan.  11/30- patient remains on ventilator. He has thickened mucopurulent tracheal secretions, he can follow verbal instructions but failed SBT today. GPC+ tracheal aspirate culture. 01/06/20-SBT for extubation, failed with heavy secretions. TOC consult today.  01/07/20-patient failed SBT after 10 minutes with acute hypoxemia. PALS consult for VDRF. Spoke with Wife Patrice about trache she wants to wait for several days 01/08/20:Palliative care discussing with family, likelihood patient will remain ventilator dependent, continues to fail SBT's has had prior episode of trach and LTACH stay. 01/10/2020:Tolerating SBT's better on current sedation Precedex/fentanyl-calm, communicates with ICU communication board tool 12/6 severe end stage COPD, will need trach, wife updated 12/7-12/8 failure to wean from vent 12/9 s/p trach 12/13 bleeding from trach site, heparin stopped 12/14 Bleeding from trach wound appears to be superficial oozing from left side of wound   CC  follow up respiratory failure  SUBJECTIVE critically ill Prognosis is guarded S/p trach bleeding stopped plan to restart  anticoagulation   BP (!) 150/92 (BP Location: Right Arm)   Pulse 99   Temp 99.32 F (37.4 C) (Bladder)   Resp (!) 22   Ht 6' 0.99" (1.854 m)   Wt 125.7 kg   SpO2 100%   BMI 36.57 kg/m    I/O last 3 completed shifts: In: 3377.3 [I.V.:406.3; NG/GT:2971] Out: 1350 [Urine:1350] No intake/output data recorded.  SpO2: 100 % O2 Flow Rate (L/min): 3 L/min FiO2 (%): 30 %  Estimated body mass index is 36.57 kg/m as calculated from the following:   Height as of this encounter: 6' 0.99" (1.854 m).   Weight as of this encounter: 125.7 kg.  SIGNIFICANT EVENTS   REVIEW OF SYSTEMS  PATIENT IS UNABLE TO PROVIDE COMPLETE REVIEW OF SYSTEMS DUE TO SEVERE CRITICAL ILLNESS        PHYSICAL EXAMINATION:  GENERAL:critically ill appearing, +resp distress HEAD: Normocephalic, atraumatic.  EYES: Pupils equal, round, reactive to light.  No scleral icterus.  MOUTH: Moist mucosal membrane. NECK: Supple.  PULMONARY: +rhonchi, +wheezing CARDIOVASCULAR: S1 and S2. Regular rate and rhythm. No murmurs, rubs, or gallops.  GASTROINTESTINAL: Soft, nontender, -distended.  Positive bowel sounds.   MUSCULOSKELETAL: No swelling, clubbing, or edema.  NEUROLOGIC: more alert and awake SKIN:intact,warm,dry  MEDICATIONS: I have reviewed all medications and confirmed regimen as documented   CULTURE RESULTS   Recent Results (from the past 240 hour(s))  Culture, respiratory (non-expectorated)     Status: None (Preliminary result)   Collection Time: 01/17/20 12:13 PM   Specimen: SPU; Respiratory  Result Value Ref Range Status   Specimen Description   Final    SPUTUM Performed at [facility], [address], [place], [place] [address]    Special Requests   Final    NONE Performed at [facility], [address], [place], [place] [address]    Gram Stain  Final    FEW WBC PRESENT, PREDOMINANTLY PMN FEW GRAM NEGATIVE RODS RARE GRAM POSITIVE COCCI    Culture   Final     CULTURE REINCUBATED FOR BETTER GROWTH Performed at [facility], [place] [address], [place], [place] [address]    Report Status PENDING  Incomplete          IMAGING    No results found.   Nutrition Status: Nutrition Problem: Inadequate oral intake Etiology: inability to eat Signs/Symptoms: NPO status Interventions: Tube feeding,Prostat,MVI   CBC    Component Value Date/Time   WBC 10.2 01/20/2020 0508   RBC 4.12 (L) 01/20/2020 0508   HGB 11.5 (L) 01/20/2020 0508   HGB 15.1 05/28/2014 1140   HCT 37.9 (L) 01/20/2020 0508   HCT 45.2 05/28/2014 1140   PLT 265 01/20/2020 0508   PLT 185 05/28/2014 1140   MCV 92.0 01/20/2020 0508   MCV 83 05/28/2014 1140   MCH 27.9 01/20/2020 0508   MCHC 30.3 01/20/2020 0508   RDW 12.6 01/20/2020 0508   RDW 13.7 05/28/2014 1140   LYMPHSABS 0.8 01/20/2020 0508   LYMPHSABS 1.7 05/28/2014 1140   MONOABS 1.3 (H) 01/20/2020 0508   MONOABS 1.5 (H) 05/28/2014 1140   EOSABS 0.1 01/20/2020 0508   EOSABS 0.1 05/28/2014 1140   BASOSABS 0.0 01/20/2020 0508   BASOSABS 0.0 05/28/2014 1140   BMP Latest Ref Rng & Units 01/20/2020 01/18/2020 01/16/2020  Glucose 70 - 99 mg/dL 112(H) 141(H) 153(H)  BUN 6 - 20 mg/dL 33(H) 37(H) 50(H)  Creatinine 0.61 - 1.24 mg/dL 1.21 1.14 1.12  Sodium 135 - 145 mmol/L 149(H) 145 144  Potassium 3.5 - 5.1 mmol/L 4.0 4.8 4.8  Chloride 98 - 111 mmol/L 108 105 109  CO2 22 - 32 mmol/L 32 33(H) 28  Calcium 8.9 - 10.3 mg/dL 9.1 9.2 8.7(L)     Indwelling Urinary Catheter continued, requirement due to   Reason to continue Indwelling Urinary Catheter strict Intake/Output monitoring for hemodynamic instability         Ventilator continued, requirement due to severe respiratory failure   Ventilator Sedation RASS 0 to -2      ASSESSMENT AND PLAN SYNOPSIS  Acute on chronic respiratory failure (hypoxia/hypercarbia) Underlying severe COPD and sarcoidosis/Atelectasis/Severe obstructive sleep apnea -Continue vent  support -Wean fio2 -tolerating PS mode -S/p Trach on 01/14/20 (to remain indefinitely per ENT due to severe scarring) 12/13 bleeding from trach, wound   Severe ACUTE Hypoxic and Hypercapnic Respiratory Failure Wean as tolerated   CARDIAC ICU monitoring  ID -continue IV abx as prescibed -follow up cultures Antibiotics Given (last 72 hours)    Date/Time Action Medication Dose Rate   01/18/20 0641 New Bag/Given   cefTRIAXone (ROCEPHIN) 1 g in sodium chloride 0.9 % 100 mL IVPB 1 g 200 mL/hr   01/20/20 6045 New Bag/Given   cefTRIAXone (ROCEPHIN) 1 g in sodium chloride 0.9 % 100 mL IVPB 1 g 200 mL/hr       GI GI PROPHYLAXIS as indicated   DIET-->TF's as tolerated Constipation protocol as indicated  ENDO - will use ICU hypoglycemic\Hyperglycemia protocol if indicated     ELECTROLYTES -follow labs as needed -replace as needed -pharmacy consultation and following   DVT/GI PRX ordered and assessed TRANSFUSIONS AS NEEDED MONITOR FSBS I Assessed the need for Labs I Assessed the need for Foley I Assessed the need for Central Venous Line Family Discussion when available I Assessed the need for Mobilization I made an Assessment of medications  to be adjusted accordingly Safety Risk assessment completed   CASE DISCUSSED IN MULTIDISCIPLINARY ROUNDS WITH ICU TEAM  Critical Care Time devoted to patient care services described in this note is  35  minutes.   Overall, patient is critically ill, prognosis is guarded.     Corrin Parker, M.D.  [facility]  Medical Director [place] Director [facility] Cardio-Pulmonary Department

## 2020-01-21 LAB — GLUCOSE, CAPILLARY
Glucose-Capillary: 129 mg/dL — ABNORMAL HIGH (ref 70–99)
Glucose-Capillary: 125 mg/dL — ABNORMAL HIGH (ref 70–99)
Glucose-Capillary: 108 mg/dL — ABNORMAL HIGH (ref 70–99)
Glucose-Capillary: 134 mg/dL — ABNORMAL HIGH (ref 70–99)
Glucose-Capillary: 107 mg/dL — ABNORMAL HIGH (ref 70–99)

## 2020-01-21 LAB — BASIC METABOLIC PANEL
Anion gap: 8 (ref 5–15)
BUN: 34 mg/dL — ABNORMAL HIGH (ref 6–20)
CO2: 32 mmol/L (ref 22–32)
Calcium: 8.7 mg/dL — ABNORMAL LOW (ref 8.9–10.3)
Chloride: 109 mmol/L (ref 98–111)
Creatinine, Ser: 1.16 mg/dL (ref 0.61–1.24)
GFR, Estimated: >60 mL/min (ref >60)
Glucose, Bld: 145 mg/dL — ABNORMAL HIGH (ref 70–99)
Potassium: 3.7 mmol/L (ref 3.5–5.1)
Sodium: 149 mmol/L — ABNORMAL HIGH (ref 135–145)

## 2020-01-21 LAB — CBC WITH DIFFERENTIAL/PLATELET
Abs Immature Granulocytes: 0.03 10*3/uL (ref 0.00–0.07)
Basophils Absolute: 0 10*3/uL (ref 0.0–0.1)
Basophils Relative: 0 %
Eosinophils Absolute: 0.1 10*3/uL (ref 0.0–0.5)
Eosinophils Relative: 1 %
HCT: 35.6 % — ABNORMAL LOW (ref 39.0–52.0)
Hemoglobin: 11.1 g/dL — ABNORMAL LOW (ref 13.0–17.0)
Immature Granulocytes: 0 %
Lymphocytes Relative: 12 %
Lymphs Abs: 0.9 10*3/uL (ref 0.7–4.0)
MCH: 28.8 pg (ref 26.0–34.0)
MCHC: 31.2 g/dL (ref 30.0–36.0)
MCV: 92.2 fL (ref 80.0–100.0)
Monocytes Absolute: 1 10*3/uL (ref 0.1–1.0)
Monocytes Relative: 13 %
Neutro Abs: 5.8 10*3/uL (ref 1.7–7.7)
Neutrophils Relative %: 74 %
Platelets: 265 10*3/uL (ref 150–400)
RBC: 3.86 MIL/uL — ABNORMAL LOW (ref 4.22–5.81)
RDW: 12.6 % (ref 11.5–15.5)
WBC: 7.8 10*3/uL (ref 4.0–10.5)
nRBC: 0 % (ref 0.0–0.2)

## 2020-01-21 LAB — PHOSPHORUS: Phosphorus: 3.6 mg/dL (ref 2.5–4.6)

## 2020-01-21 LAB — HEPARIN LEVEL (UNFRACTIONATED): Heparin Unfractionated: 0.36 IU/mL (ref 0.30–0.70)

## 2020-01-21 LAB — MAGNESIUM: Magnesium: 2.2 mg/dL (ref 1.7–2.4)

## 2020-01-21 MED ORDER — GLYCOPYRROLATE 0.2 MG/ML IJ SOLN
0.2000 mg | Freq: Once | INTRAMUSCULAR | Status: AC
  Administered 2020-01-21: 12:00:00 0.2 mg via INTRAVENOUS
  Filled 2020-01-21: qty 1

## 2020-01-21 NOTE — Progress Notes (Signed)
CRITICAL CARE NOTE  49 y.o.male.with medical history significant ofchronic combined systolic and diastolic CHF, COPD, CAD, history of MI, type 2 diabetes, hyperlipidemia, hypertension, sarcoidosis, sleep apnea not using Trilogy vent regularly, he has chronic hypercapnic respiratory failure due to end-stage lung disease.Previous h/o [facility] AND LTACH, admitted with acute on chronic mixed respiratory failure requiring intubation PCCM consulted.  11/27 ICU admission, INTUBATED #8ETT 11/28 severe resp failure, progressive Renal failure, Nephrology consulted 11/29 - patient passed SBT today, able to communicate/follow directions while intubated status post extubation to BIPAP - after 1 hour on BIPAP patient became unresponsive and required re-intubation. I have met with mother in law at bedside and reviewed careplan.  11/30- patient remains on ventilator. He has thickened mucopurulent tracheal secretions, he can follow verbal instructions but failed SBT today. GPC+ tracheal aspirate culture. 01/06/20-SBT for extubation, failed with heavy secretions. TOC consult today.  01/07/20-patient failed SBT after 10 minutes with acute hypoxemia. PALS consult for VDRF. Spoke with Wife Patrice about trache she wants to wait for several days 01/08/20:Palliative care discussing with family, likelihood patient will remain ventilator dependent, continues to fail SBT's has had prior episode of trach and LTACH stay. 01/10/2020:Tolerating SBT's better on current sedation Precedex/fentanyl-calm, communicates with ICU communication board tool 12/6 severe end stage COPD, will need trach, wife updated 12/7-12/8 failure to wean from vent 12/9 s/p trach 12/13 bleeding from trach site, heparin stopped 12/14 Bleeding from trach wound appears to be superficial oozing from left side of wound 12/15 failed SAT/SBT increased WOB, increased secretions    CC  follow up respiratory failure  SUBJECTIVE Patient remains  ill Prognosis is guarded S/p trach Alert and awake On full vent support    BP (!) 143/86   Pulse 91   Temp 99.5 F (37.5 C)   Resp (!) 26   Ht 6' 0.99" (1.854 m)   Wt 115.9 kg   SpO2 100%   BMI 33.72 kg/m    I/O last 3 completed shifts: In: 2090.5 [I.V.:450.9; NG/GT:1439.7; IV Piggyback:200] Out: 2275 [Urine:1675; Emesis/NG output:600] No intake/output data recorded.  SpO2: 100 % O2 Flow Rate (L/min): 3 L/min FiO2 (%): 30 %  Estimated body mass index is 33.72 kg/m as calculated from the following:   Height as of this encounter: 6' 0.99" (1.854 m).   Weight as of this encounter: 115.9 kg.  SIGNIFICANT EVENTS +SOB   PHYSICAL EXAMINATION:  GENERAL:critically ill appearing, +resp distress HEAD: Normocephalic, atraumatic.  EYES: Pupils equal, round, reactive to light.  No scleral icterus.  MOUTH: Moist mucosal membrane. NECK: Supple.  PULMONARY: +rhonchi, +wheezing CARDIOVASCULAR: S1 and S2. Regular rate and rhythm. No murmurs, rubs, or gallops.  GASTROINTESTINAL: Soft, nontender, -distended.  Positive bowel sounds.   MUSCULOSKELETAL: No swelling, clubbing, or edema.  NEUROLOGIC: no focal defecits SKIN:intact,warm,dry  MEDICATIONS: I have reviewed all medications and confirmed regimen as documented   CULTURE RESULTS   Recent Results (from the past 240 hour(s))  Culture, respiratory (non-expectorated)     Status: None   Collection Time: 01/17/20 12:13 PM   Specimen: SPU; Respiratory  Result Value Ref Range Status   Specimen Description   Final    SPUTUM Performed at [facility], [address] [place], [place] [address]    Special Requests   Final    NONE Performed at [facility], [address] [place], [place] [address]    Gram Stain   Final    FEW WBC PRESENT, PREDOMINANTLY PMN FEW GRAM NEGATIVE RODS RARE  GRAM POSITIVE COCCI    Culture   Final    FEW Normal respiratory flora-no Staph aureus or Pseudomonas seen Performed  at [facility], [place] [address] [place],  [address]    Report Status 01/20/2020 FINAL  Final          IMAGING    No results found.   Nutrition Status: Nutrition Problem: Inadequate oral intake Etiology: inability to eat Signs/Symptoms: NPO status Interventions: Tube feeding,Prostat,MVI     Indwelling Urinary Catheter continued, requirement due to   Reason to continue Indwelling Urinary Catheter strict Intake/Output monitoring for hemodynamic instability         Ventilator continued, requirement due to severe respiratory failure     ASSESSMENT AND PLAN SYNOPSIS   Acute on chronic respiratory failure (hypoxia/hypercarbia) Underlying severe COPD and sarcoidosis/Atelectasis/Severe obstructive sleep apnea -Continue vent support -Wean fio2 -tolerating PS mode -S/p Trach on 01/14/20 (to remain indefinitely per ENT due to severe scarring) 12/13 bleeding from trach, wound   Severe ACUTE Hypoxic and Hypercapnic Respiratory Failure Wean to PS as tolerated   Morbid obesity, +OSA.   S/p trach    CARDIAC ICU monitoring  ID -continue IV abx as prescibed -follow up cultures  GI GI PROPHYLAXIS as indicated  NUTRITIONAL STATUS Nutrition Status: Nutrition Problem: Inadequate oral intake Etiology: inability to eat Signs/Symptoms: NPO status Interventions: Tube feeding,Prostat,MVI   DIET-->TF's as tolerated Constipation protocol as indicated  ENDO - will use ICU hypoglycemic\Hyperglycemia protocol if indicated     ELECTROLYTES -follow labs as needed -replace as needed -pharmacy consultation and following   DVT/GI PRX ordered and assessed TRANSFUSIONS AS NEEDED MONITOR FSBS I Assessed the need for Labs I Assessed the need for Foley I Assessed the need for Central Venous Line Family Discussion when available I Assessed the need for Mobilization I made an Assessment of medications to be adjusted accordingly Safety Risk assessment  completed   CASE DISCUSSED IN MULTIDISCIPLINARY ROUNDS WITH ICU TEAM  Critical Care Time devoted to patient care services described in this note is 32 minutes.   Overall, patient is critically ill, prognosis is guarded.     Corrin Parker, M.D.  [facility]  Medical Director [place] Director [facility] Cardio-Pulmonary Department

## 2020-01-21 NOTE — Progress Notes (Signed)
[place] for Heparin Infusion Indication: atrial fibrillation  Allergies  Allergen Reactions  . Penicillins Anaphylaxis and Other (See Comments)    Has patient had a PCN reaction causing immediate rash, facial/tongue/throat swelling, SOB or lightheadedness with hypotension: Yes Has patient had a PCN reaction causing severe rash involving mucus membranes or skin necrosis: No Has patient had a PCN reaction that required hospitalization: No Has patient had a PCN reaction occurring within the last 10 years: No If all of the above answers are "NO", then may proceed with Cephalosporin use.     Patient Measurements: Height: 6' 0.99" (185.4 cm) Weight: 125.7 kg (277 lb 1.9 oz) IBW/kg (Calculated) : 79.88 Heparin Dosing Weight: 107.7 kg  Vital Signs: Temp: 99.5 F (37.5 C) (12/16 0200) Temp Source: Bladder (12/15 2000) BP: 130/81 (12/16 0200) Pulse Rate: 95 (12/16 0200)  Labs: Recent Labs    01/18/20 0435 01/18/20 1243 01/18/20 2100 01/19/20 0237 01/19/20 0601 01/20/20 0508 01/20/20 2028 01/21/20 0305  HGB 12.4*  --   --    < > 11.8* 11.5*  --  11.1*  HCT 40.4  --   --    < > 38.6* 37.9*  --  35.6*  PLT 247  --   --   --  251 265  --  265  APTT 46* 142* 67*  --  34  --   --   --   HEPARINUNFRC 0.89*  --   --   --  0.36  --  0.59 0.36  CREATININE 1.14  --   --   --   --  1.21  --  1.16   < > = values in this interval not displayed.    Estimated Creatinine Clearance: 107 mL/min (by C-G formula based on SCr of 1.16 mg/dL).   Medical History: Past Medical History:  Diagnosis Date  . CHF (congestive heart failure) (HCC)    EF 45-50%- 2020  . CKD (chronic kidney disease) stage 3, GFR 30-59 ml/min (HCC)   . Clear cell renal cell carcinoma (HCC)    s/p partial nephrectomy  . COPD (chronic obstructive pulmonary disease) ([place])   . Coronary artery disease   . Diabetes mellitus without complication ([place])   . Hypercholesteremia unk  .  Hypertension   . Myocardial infarction ([place])   . Sarcoidosis   . Sleep apnea    does not use CPAP regularly.  . Stroke [facility])     Medications:   Apixaban 5mg  - Last dose 12/12 @ 0919  Assessment 12/15 Bleeding at trach site deemed superficial, okay to resume heparin gtt per MD.   Goal of Therapy:   Heparin level 0.3-0.7 units/ml aPTT 66-102 seconds Monitor platelets by anticoagulation protocol: Yes   12/13 @ 0435 aPTT 46 Subtherapeutic, HL 0.89, bolus 3000 units x1, increased infusion rate to 1950 units/hr. 12/13 @ 1243 aPTT 142 (supratherapeutic), will hold x 1 hr, then decrease rate to 1650 units/hr 12/13 @ 2100 aPTT 67 (therapeutic) on lower end, increaseed heparin to 1700 units/hr  12/14 @ 0601 aPTT 34 Subtherapeutic, HL 0.36, therapeutic -- Heparin infusion off since 0253 AM due to bleeding at trach site 12/15 2028 HL 0.59  12/16 @ 0305 HL = 0.36,  Therapeutic X 2     Plan:  12/16:  HL @ 0305 = 0.36, therapeutic X 2  Will continue pt on current rate and recheck HL on 12/17 with AM labs.   Zaiyah Sottile D 01/21/2020 4:33 AM

## 2020-01-22 LAB — CBC WITH DIFFERENTIAL/PLATELET
Abs Immature Granulocytes: 0.03 10*3/uL (ref 0.00–0.07)
Basophils Absolute: 0 10*3/uL (ref 0.0–0.1)
Basophils Relative: 1 %
Eosinophils Absolute: 0.1 10*3/uL (ref 0.0–0.5)
Eosinophils Relative: 1 %
HCT: 34.7 % — ABNORMAL LOW (ref 39.0–52.0)
Hemoglobin: 10.9 g/dL — ABNORMAL LOW (ref 13.0–17.0)
Immature Granulocytes: 1 %
Lymphocytes Relative: 13 %
Lymphs Abs: 0.9 10*3/uL (ref 0.7–4.0)
MCH: 28.8 pg (ref 26.0–34.0)
MCHC: 31.4 g/dL (ref 30.0–36.0)
MCV: 91.8 fL (ref 80.0–100.0)
Monocytes Absolute: 0.7 10*3/uL (ref 0.1–1.0)
Monocytes Relative: 11 %
Neutro Abs: 4.8 10*3/uL (ref 1.7–7.7)
Neutrophils Relative %: 73 %
Platelets: 251 10*3/uL (ref 150–400)
RBC: 3.78 MIL/uL — ABNORMAL LOW (ref 4.22–5.81)
RDW: 12.3 % (ref 11.5–15.5)
WBC: 6.5 10*3/uL (ref 4.0–10.5)
nRBC: 0 % (ref 0.0–0.2)

## 2020-01-22 LAB — BASIC METABOLIC PANEL
Anion gap: 10 (ref 5–15)
BUN: 27 mg/dL — ABNORMAL HIGH (ref 6–20)
CO2: 31 mmol/L (ref 22–32)
Calcium: 8.5 mg/dL — ABNORMAL LOW (ref 8.9–10.3)
Chloride: 101 mmol/L (ref 98–111)
Creatinine, Ser: 1.09 mg/dL (ref 0.61–1.24)
GFR, Estimated: >60 mL/min (ref >60)
Glucose, Bld: 129 mg/dL — ABNORMAL HIGH (ref 70–99)
Potassium: 3.6 mmol/L (ref 3.5–5.1)
Sodium: 142 mmol/L (ref 135–145)

## 2020-01-22 LAB — MAGNESIUM: Magnesium: 2.2 mg/dL (ref 1.7–2.4)

## 2020-01-22 LAB — GLUCOSE, CAPILLARY
Glucose-Capillary: 115 mg/dL — ABNORMAL HIGH (ref 70–99)
Glucose-Capillary: 140 mg/dL — ABNORMAL HIGH (ref 70–99)
Glucose-Capillary: 84 mg/dL (ref 70–99)
Glucose-Capillary: 130 mg/dL — ABNORMAL HIGH (ref 70–99)
Glucose-Capillary: 126 mg/dL — ABNORMAL HIGH (ref 70–99)

## 2020-01-22 LAB — PHOSPHORUS: Phosphorus: 3.1 mg/dL (ref 2.5–4.6)

## 2020-01-22 LAB — HEPARIN LEVEL (UNFRACTIONATED): Heparin Unfractionated: 0.38 IU/mL (ref 0.30–0.70)

## 2020-01-22 MED ORDER — APIXABAN 5 MG PO TABS
5.0000 mg | ORAL_TABLET | Freq: Two times a day (BID) | ORAL | Status: DC
  Administered 2020-01-22 – 2020-01-25 (×8): 5 mg
  Filled 2020-01-22 (×8): qty 1

## 2020-01-22 MED ORDER — GLYCOPYRROLATE 0.2 MG/ML IJ SOLN
0.2000 mg | Freq: Once | INTRAMUSCULAR | Status: AC
  Administered 2020-01-22: 11:00:00 0.2 mg via INTRAVENOUS
  Filled 2020-01-22: qty 1

## 2020-01-22 NOTE — TOC Progression Note (Signed)
Transition of Care [facility]) - Progression Note    Patient Details  Name: [name] MRN: [id number] Date of Birth: Apr 26, 1970  Transition of Care [facility]) CM/SW [place], [place] Phone Number: [phone] 01/22/2020, 3:58 PM  Clinical Narrative:     Danise Mina from [place] will begin insurance authorization today.  Danise Mina stated she will update CSW as soon as she receives a reply. CSW contacted patient's Capers,[name] [phone] with update.   Expected Discharge Plan: Long Term Acute Care (LTAC) Barriers to Discharge: Continued Medical Work up  Expected Discharge Plan and Services Expected Discharge Plan: Long Term Acute Care (LTAC) In-house Referral: Clinical Social Work   Post Acute Care Choice: Long Term Acute Care (LTAC) Living arrangements for the past 2 months: Single Family Home                                       Social Determinants of Health (SDOH) Interventions    Readmission Risk Interventions Readmission Risk Prevention Plan 11/19/2018 07/30/2018 07/29/2018  Transportation Screening Complete Complete Complete  PCP or Specialist Appt within 3-5 Days - Complete Complete  [place] or [place] - Complete Complete  Social Work Consult for [place] Planning/Counseling - - Patient refused  Palliative Care Screening - Not Applicable Not Applicable  Medication Review Press photographer) Referral to Pharmacy Complete Complete  PCP or Specialist appointment within 3-5 days of discharge Complete - -  [place] or Home Care Consult Patient refused - -  SW Recovery Care/Counseling Consult Complete - -  Palliative Care Screening Not Applicable - -  [place] Not Applicable - -  Some recent data might be hidden

## 2020-01-22 NOTE — Progress Notes (Signed)
[place] for Heparin Infusion Indication: atrial fibrillation  Allergies  Allergen Reactions  . Penicillins Anaphylaxis and Other (See Comments)    Has patient had a PCN reaction causing immediate rash, facial/tongue/throat swelling, SOB or lightheadedness with hypotension: Yes Has patient had a PCN reaction causing severe rash involving mucus membranes or skin necrosis: No Has patient had a PCN reaction that required hospitalization: No Has patient had a PCN reaction occurring within the last 10 years: No If all of the above answers are "NO", then may proceed with Cephalosporin use.     Patient Measurements: Height: 6' 0.99" (185.4 cm) Weight: 117.1 kg (258 lb 2.5 oz) IBW/kg (Calculated) : 79.88 Heparin Dosing Weight: 107.7 kg  Vital Signs: Temp: 100.04 F (37.8 C) (12/17 0430) Temp Source: Bladder (12/16 2000) BP: 141/83 (12/17 0630) Pulse Rate: 87 (12/17 0630)  Labs: Recent Labs    01/20/20 0508 01/20/20 2028 01/21/20 0305 01/22/20 0543  HGB 11.5*  --  11.1* 10.9*  HCT 37.9*  --  35.6* 34.7*  PLT 265  --  265 251  HEPARINUNFRC  --  0.59 0.36 0.38  CREATININE 1.21  --  1.16 1.09    Estimated Creatinine Clearance: 109.9 mL/min (by C-G formula based on SCr of 1.09 mg/dL).   Medical History: Past Medical History:  Diagnosis Date  . CHF (congestive heart failure) (HCC)    EF 45-50%- 2020  . CKD (chronic kidney disease) stage 3, GFR 30-59 ml/min (HCC)   . Clear cell renal cell carcinoma (HCC)    s/p partial nephrectomy  . COPD (chronic obstructive pulmonary disease) ([place])   . Coronary artery disease   . Diabetes mellitus without complication ([place])   . Hypercholesteremia unk  . Hypertension   . Myocardial infarction ([place])   . Sarcoidosis   . Sleep apnea    does not use CPAP regularly.  . Stroke [facility])     Medications:   Apixaban 5mg  - Last dose 12/12 @ 0919  Assessment 12/15 Bleeding at trach site deemed  superficial, okay to resume heparin gtt per MD.  H&H stable  Goal of Therapy:   Heparin level 0.3-0.7 units/ml aPTT 66-102 seconds Monitor platelets by anticoagulation protocol: Yes   12/13 @ 0435 aPTT 46 Subtherapeutic, HL 0.89, bolus 3000 units x1, increased infusion rate to 1950 units/hr. 12/13 @ 1243 aPTT 142 (supratherapeutic), will hold x 1 hr, then decrease rate to 1650 units/hr 12/13 @ 2100 aPTT 67 (therapeutic) on lower end, increaseed heparin to 1700 units/hr  12/14 @ 0601 aPTT 34 Subtherapeutic, HL 0.36, therapeutic -- Heparin infusion off since 0253 AM due to bleeding at trach site 12/15 2028 HL 0.59 ,therapeutic x1 12/16 @ 0305 HL = 0.36,  Therapeutic X 2  12/17 0543 HL = 0.38    Plan:   Will continue pt on current rate and recheck HL on 12/18 with AM labs.   CBC daily  Dorothe Pea, PharmD, BCPS 01/22/2020 7:35 AM

## 2020-01-22 NOTE — Progress Notes (Signed)
Occupational Therapy Treatment Patient Details Name: [name] MRN: [id number] DOB: February 19, 1970 Today's Date: 01/22/2020    History of present illness Pt admitted for acute resp failure. History includes severe COPD and sarcoidosis. Of note, stay complicated by intubation from 11/27-11/29; then reintubated 11/29-12/9/21. Chronic O2 on 2L of O2 at baseline. Pt with NG tube and trach.   OT comments  Pt seen for OT treatment this date to f/u re: safety with ADLs and strength as it applies to ADLs/ADL mobility. Pt's spouse present in room throughout treatment. OT facilitates pt participationin 1 set x15 reps contralateral reaching/punching to increase core stability and assist with expulsion of secretions, 1 set x15 reps straight arm raises with postural extension to improve surface area for quality of breathing, and 3 sets x5 reps chair push ups/tricep push ups to improve strength for ADL transfers. Pt tolerates well, but does require 4-5 minute rest breaks between each exercise, both to let his O2 sats recover and for his RR to come down. Pt on 9L trach collar throughout. Primarily sustains sats above 90, but does briefly decreased to 87-88% with chair push ups on the 9L, but recovers within ~30 seconds. At end of session, OT requests assistance of RN to manage lines/leads during transfer back to bed with [name] RW. Pt requires MOD A and extended time to CTS as well as cues for safe hand placement with use of RW. Pt takes 4-5 small shuffling steps to bed with cues to pace himself and control steps. Pt requires MIN A to advance LEs back to bed. Left in bed with RN and spouse present. Continue to anticipate that pt will require f/u therapy in an LTACH setting to improve his strength for ADLs/ADL mobility in prep for return to home environment.    Follow Up Recommendations  LTACH    Equipment Recommendations  Other (comment) (defer to next level of care)    Recommendations for Other Services       Precautions / Restrictions Precautions Precautions: Fall Precaution Comments: Trach, vent, NG tube Restrictions Weight Bearing Restrictions: No       Mobility Bed Mobility Overal bed mobility: Needs Assistance Bed Mobility: Sit to Supine     Supine to sit: Min assist;HOB elevated Sit to supine: Min assist;Mod assist   General bed mobility comments: MIN/MOD A to raise LEs to bed  Transfers Overall transfer level: Needs assistance Equipment used: Rolling walker (2 wheeled) Transfers: Sit to/from Stand Sit to Stand: +2 safety/equipment;Min assist;Mod assist   Squat pivot transfers: Max assist     General transfer comment: MOD A to CTS from OT, RN assisting to manage lines/leads. Pt comes to full stand with good stability with use of bari RW and cues to extend posture. OT provides MIN verbal/tactile cues for safe hand placement with RW    Balance Overall balance assessment: Needs assistance Sitting-balance support: Single extremity supported Sitting balance-Leahy Scale: Fair Sitting balance - Comments: tendency to lean to L side, uses at least 1 UE to support while in EOB sitting     Standing balance-Leahy Scale: Poor Standing balance comment: requires external support of at least MIN A as well as B UE support with RW                           ADL either performed or assessed with clinical judgement   ADL Overall ADL's : Needs assistance/impaired  Functional mobility during ADLs: Minimal assistance;+2 for safety/equipment;Rolling walker (bari RW to take ~4-5 small steps from chair to bed, 2nd person to manage lines/leads)       Vision Baseline Vision/History: Wears glasses Wears Glasses: At all times Patient Visual Report: No change from baseline     Perception     Praxis      Cognition Arousal/Alertness: Awake/alert Behavior During Therapy: WFL for tasks assessed/performed Overall Cognitive  Status: Within Functional Limits for tasks assessed                                 General Comments: uses paper/pen for some conversational topics, but is primarily able to communicate with lip movement d/t trach/trach collar        Exercises Other Exercises Other Exercises: OT facilitates pt participationin 1 set x15 reps contralateral reaching/punching to increase core stability and assist with expulsion of secretions, 1 set x15 reps straight arm raises with postural extension to improve surface area for quality of breathing, and 3 sets x5 reps chair push ups/tricep push ups to improve strength for ADL transfers. Pt tolerates well, but does require 4-5 minute rest breaks between each exercise, both to let his O2 sats recover and for his RR to come down. Pt on 9L trach collar throughout. Primarily sustains sats above 90, but does briefly decreased to 87-88% with chair push ups on the 9L, but recovers within ~30 seconds. Other Exercises: STS from 3x c min-modA from author   Shoulder Instructions       General Comments      Pertinent Vitals/ Pain       Pain Assessment: No/denies pain  Home Living                                          Prior Functioning/Environment              Frequency  Min 2X/week        Progress Toward Goals  OT Goals(current goals can now be found in the care plan section)  Progress towards OT goals: Progressing toward goals  Acute Rehab OT Goals Patient Stated Goal: get stronger, be more active OT Goal Formulation: With patient Time For Goal Achievement: 02/03/20 Potential to Achieve Goals: Good  Plan Discharge plan remains appropriate    Co-evaluation                 AM-PAC OT "6 Clicks" Daily Activity     Outcome Measure   Help from another person eating meals?: A Little Help from another person taking care of personal grooming?: A Little Help from another person toileting, which includes using  toliet, bedpan, or urinal?: Total Help from another person bathing (including washing, rinsing, drying)?: Total Help from another person to put on and taking off regular upper body clothing?: A Lot Help from another person to put on and taking off regular lower body clothing?: A Lot 6 Click Score: 12    End of Session Equipment Utilized During Treatment: Gait belt;Rolling walker  OT Visit Diagnosis: Other abnormalities of gait and mobility (R26.89);Muscle weakness (generalized) (M62.81)   Activity Tolerance Patient tolerated treatment well   Patient Left in bed;with call bell/phone within reach;with bed alarm set   Nurse Communication Mobility status        Time: 1601-1700  OT Time Calculation (min): 59 min  Charges: OT General Charges $OT Visit: 1 Visit OT Treatments $Therapeutic Activity: 23-37 mins $Therapeutic Exercise: 23-37 mins  Gerrianne Scale, MS, OTR/L ascom [phone] 01/22/20, 5:18 PM

## 2020-01-22 NOTE — Progress Notes (Signed)
Physical Therapy Treatment Patient Details Name: [name] MRN: [id number] DOB: 1970/08/19 Today's Date: 01/22/2020    History of Present Illness Pt admitted for acute resp failure. History includes severe COPD and sarcoidosis. Of note, stay complicated by intubation from 11/27-11/29; then reintubated 11/29-12/9/21. Chronic O2 on 2L of O2 at baseline. Pt with NG tube and trach.    PT Comments    Pt in bed upon entry, awake agreeable to treatment session. Progressed bed mobility and transfers this date. Session performed on ventilator support via tracheostomy, RN assisting. Pt able to stand 5-6x in session with min-modA, fails attempt to stand pivot to recliner, but is able to squat pivot with MaxA. Pt is very motivated to work hard and improve function. Pt in recliner at [place], RN at bedside.   Follow Up Recommendations  LTACH     Equipment Recommendations  Other (comment) (not ambulatory yet)    Recommendations for Other Services       Precautions / Restrictions Precautions Precautions: Fall Precaution Comments: Trach, vent, NG tube Restrictions Weight Bearing Restrictions: No    Mobility  Bed Mobility Overal bed mobility: Needs Assistance Bed Mobility: Supine to Sit     Supine to sit: Min assist;HOB elevated        Transfers Overall transfer level: Needs assistance Equipment used: 1 person hand held assist Transfers: Sit to/from [employer] Sit to Stand: Mod assist   Squat pivot transfers: Max assist     General transfer comment: legs a shakey, weak; confidence fluctuates.  Ambulation/Gait Ambulation/Gait assistance:  (unable at this time due tounsteadiness of legs)               Stairs             Wheelchair Mobility    Modified Rankin (Stroke Patients Only)       Balance                                            Cognition Arousal/Alertness: Awake/alert Behavior During Therapy: WFL for tasks  assessed/performed Overall Cognitive Status: Within Functional Limits for tasks assessed                                        Exercises Other Exercises Other Exercises: Manually resisted leg press 1x10 bilat Other Exercises: STS from 3x c min-modA from author    General Comments        Pertinent Vitals/Pain Pain Assessment: No/denies pain    Home Living                      Prior Function            PT Goals (current goals can now be found in the care plan section) Acute Rehab PT Goals Patient Stated Goal: get stronger, be more active PT Goal Formulation: With patient Time For Goal Achievement: 02/03/20 Potential to Achieve Goals: Fair Progress towards PT goals: Progressing toward goals    Frequency    Min 2X/week      PT Plan Current plan remains appropriate    Co-evaluation              AM-PAC PT "6 Clicks" Mobility   Outcome Measure  Help needed turning from your back to  your side while in a flat bed without using bedrails?: A Lot Help needed moving from lying on your back to sitting on the side of a flat bed without using bedrails?: A Lot Help needed moving to and from a bed to a chair (including a wheelchair)?: A Lot Help needed standing up from a chair using your arms (e.g., wheelchair or bedside chair)?: A Lot Help needed to walk in hospital room?: Total Help needed climbing 3-5 steps with a railing? : Total 6 Click Score: 10    End of Session Equipment Utilized During Treatment: Oxygen Activity Tolerance: Patient tolerated treatment well;Patient limited by fatigue Patient left: in chair;with nursing/sitter in room;with call bell/phone within reach Nurse Communication: Mobility status PT Visit Diagnosis: Muscle weakness (generalized) (M62.81);Difficulty in walking, not elsewhere classified (R26.2)     Time: 1335-1400 PT Time Calculation (min) (ACUTE ONLY): 25 min  Charges:  $Therapeutic Exercise: 23-37 mins                      3:31 PM, 01/22/20 Etta Grandchild, PT, DPT Physical Therapist - [facility]  [phone] ([place])    Aerionna Moravek C 01/22/2020, 3:29 PM

## 2020-01-23 LAB — BASIC METABOLIC PANEL
Anion gap: 9 (ref 5–15)
BUN: 24 mg/dL — ABNORMAL HIGH (ref 6–20)
CO2: 31 mmol/L (ref 22–32)
Calcium: 8.4 mg/dL — ABNORMAL LOW (ref 8.9–10.3)
Chloride: 100 mmol/L (ref 98–111)
Creatinine, Ser: 1 mg/dL (ref 0.61–1.24)
GFR, Estimated: >60 mL/min (ref >60)
Glucose, Bld: 124 mg/dL — ABNORMAL HIGH (ref 70–99)
Potassium: 3.7 mmol/L (ref 3.5–5.1)
Sodium: 140 mmol/L (ref 135–145)

## 2020-01-23 LAB — CBC WITH DIFFERENTIAL/PLATELET
Abs Immature Granulocytes: 0.01 10*3/uL (ref 0.00–0.07)
Basophils Absolute: 0 10*3/uL (ref 0.0–0.1)
Basophils Relative: 0 %
Eosinophils Absolute: 0.1 10*3/uL (ref 0.0–0.5)
Eosinophils Relative: 2 %
HCT: 35.1 % — ABNORMAL LOW (ref 39.0–52.0)
Hemoglobin: 11.1 g/dL — ABNORMAL LOW (ref 13.0–17.0)
Immature Granulocytes: 0 %
Lymphocytes Relative: 15 %
Lymphs Abs: 0.9 10*3/uL (ref 0.7–4.0)
MCH: 28.5 pg (ref 26.0–34.0)
MCHC: 31.6 g/dL (ref 30.0–36.0)
MCV: 90.2 fL (ref 80.0–100.0)
Monocytes Absolute: 0.7 10*3/uL (ref 0.1–1.0)
Monocytes Relative: 11 %
Neutro Abs: 4.3 10*3/uL (ref 1.7–7.7)
Neutrophils Relative %: 72 %
Platelets: 265 10*3/uL (ref 150–400)
RBC: 3.89 MIL/uL — ABNORMAL LOW (ref 4.22–5.81)
RDW: 11.9 % (ref 11.5–15.5)
WBC: 6 10*3/uL (ref 4.0–10.5)
nRBC: 0 % (ref 0.0–0.2)

## 2020-01-23 LAB — GLUCOSE, CAPILLARY
Glucose-Capillary: 123 mg/dL — ABNORMAL HIGH (ref 70–99)
Glucose-Capillary: 94 mg/dL (ref 70–99)
Glucose-Capillary: 108 mg/dL — ABNORMAL HIGH (ref 70–99)
Glucose-Capillary: 132 mg/dL — ABNORMAL HIGH (ref 70–99)

## 2020-01-23 LAB — CULTURE, RESPIRATORY W GRAM STAIN: Culture: NORMAL

## 2020-01-23 LAB — PHOSPHORUS: Phosphorus: 2.8 mg/dL (ref 2.5–4.6)

## 2020-01-23 LAB — MAGNESIUM: Magnesium: 2.1 mg/dL (ref 1.7–2.4)

## 2020-01-24 LAB — BASIC METABOLIC PANEL
Anion gap: 8 (ref 5–15)
BUN: 23 mg/dL — ABNORMAL HIGH (ref 6–20)
CO2: 31 mmol/L (ref 22–32)
Calcium: 8.5 mg/dL — ABNORMAL LOW (ref 8.9–10.3)
Chloride: 98 mmol/L (ref 98–111)
Creatinine, Ser: 0.98 mg/dL (ref 0.61–1.24)
GFR, Estimated: >60 mL/min (ref >60)
Glucose, Bld: 130 mg/dL — ABNORMAL HIGH (ref 70–99)
Potassium: 3.7 mmol/L (ref 3.5–5.1)
Sodium: 137 mmol/L (ref 135–145)

## 2020-01-24 LAB — CBC WITH DIFFERENTIAL/PLATELET
Abs Immature Granulocytes: 0.02 10*3/uL (ref 0.00–0.07)
Basophils Absolute: 0 10*3/uL (ref 0.0–0.1)
Basophils Relative: 0 %
Eosinophils Absolute: 0.1 10*3/uL (ref 0.0–0.5)
Eosinophils Relative: 2 %
HCT: 35.2 % — ABNORMAL LOW (ref 39.0–52.0)
Hemoglobin: 11.7 g/dL — ABNORMAL LOW (ref 13.0–17.0)
Immature Granulocytes: 0 %
Lymphocytes Relative: 17 %
Lymphs Abs: 0.9 10*3/uL (ref 0.7–4.0)
MCH: 29 pg (ref 26.0–34.0)
MCHC: 33.2 g/dL (ref 30.0–36.0)
MCV: 87.3 fL (ref 80.0–100.0)
Monocytes Absolute: 0.6 10*3/uL (ref 0.1–1.0)
Monocytes Relative: 12 %
Neutro Abs: 3.6 10*3/uL (ref 1.7–7.7)
Neutrophils Relative %: 69 %
Platelets: 263 10*3/uL (ref 150–400)
RBC: 4.03 MIL/uL — ABNORMAL LOW (ref 4.22–5.81)
RDW: 11.9 % (ref 11.5–15.5)
WBC: 5.2 10*3/uL (ref 4.0–10.5)
nRBC: 0 % (ref 0.0–0.2)

## 2020-01-24 LAB — GLUCOSE, CAPILLARY
Glucose-Capillary: 129 mg/dL — ABNORMAL HIGH (ref 70–99)
Glucose-Capillary: 97 mg/dL (ref 70–99)
Glucose-Capillary: 111 mg/dL — ABNORMAL HIGH (ref 70–99)
Glucose-Capillary: 101 mg/dL — ABNORMAL HIGH (ref 70–99)

## 2020-01-24 LAB — MAGNESIUM: Magnesium: 2.2 mg/dL (ref 1.7–2.4)

## 2020-01-24 LAB — PHOSPHORUS: Phosphorus: 3 mg/dL (ref 2.5–4.6)

## 2020-01-24 MED ORDER — COVID-19 MRNA VACC (MODERNA) 50 MCG/0.25ML IM SUSP
0.2500 mL | Freq: Once | INTRAMUSCULAR | Status: DC
  Filled 2020-01-24: qty 0.25

## 2020-01-24 MED ORDER — IPRATROPIUM-ALBUTEROL 0.5-2.5 (3) MG/3ML IN SOLN
3.0000 mL | Freq: Two times a day (BID) | RESPIRATORY_TRACT | Status: DC
  Administered 2020-01-24 – 2020-01-26 (×4): 3 mL via RESPIRATORY_TRACT
  Filled 2020-01-24 (×4): qty 3

## 2020-01-24 MED ORDER — COVID-19 MRNA VACC (MODERNA) 50 MCG/0.25ML IM SUSP
0.2500 mL | Freq: Once | INTRAMUSCULAR | Status: AC
  Administered 2020-01-25: 15:00:00 0.25 mL via INTRAMUSCULAR
  Filled 2020-01-24: qty 0.25

## 2020-01-25 LAB — GLUCOSE, CAPILLARY
Glucose-Capillary: 117 mg/dL — ABNORMAL HIGH (ref 70–99)
Glucose-Capillary: 136 mg/dL — ABNORMAL HIGH (ref 70–99)
Glucose-Capillary: 104 mg/dL — ABNORMAL HIGH (ref 70–99)
Glucose-Capillary: 128 mg/dL — ABNORMAL HIGH (ref 70–99)

## 2020-01-25 LAB — CBC WITH DIFFERENTIAL/PLATELET
Abs Immature Granulocytes: 0.01 10*3/uL (ref 0.00–0.07)
Basophils Absolute: 0 10*3/uL (ref 0.0–0.1)
Basophils Relative: 0 %
Eosinophils Absolute: 0.1 10*3/uL (ref 0.0–0.5)
Eosinophils Relative: 2 %
HCT: 35.5 % — ABNORMAL LOW (ref 39.0–52.0)
Hemoglobin: 11.7 g/dL — ABNORMAL LOW (ref 13.0–17.0)
Immature Granulocytes: 0 %
Lymphocytes Relative: 20 %
Lymphs Abs: 1.1 10*3/uL (ref 0.7–4.0)
MCH: 28.5 pg (ref 26.0–34.0)
MCHC: 33 g/dL (ref 30.0–36.0)
MCV: 86.6 fL (ref 80.0–100.0)
Monocytes Absolute: 0.7 10*3/uL (ref 0.1–1.0)
Monocytes Relative: 13 %
Neutro Abs: 3.6 10*3/uL (ref 1.7–7.7)
Neutrophils Relative %: 65 %
Platelets: 290 10*3/uL (ref 150–400)
RBC: 4.1 MIL/uL — ABNORMAL LOW (ref 4.22–5.81)
RDW: 11.9 % (ref 11.5–15.5)
WBC: 5.6 10*3/uL (ref 4.0–10.5)
nRBC: 0 % (ref 0.0–0.2)

## 2020-01-25 LAB — BASIC METABOLIC PANEL
Anion gap: 7 (ref 5–15)
BUN: 24 mg/dL — ABNORMAL HIGH (ref 6–20)
CO2: 34 mmol/L — ABNORMAL HIGH (ref 22–32)
Calcium: 8.3 mg/dL — ABNORMAL LOW (ref 8.9–10.3)
Chloride: 97 mmol/L — ABNORMAL LOW (ref 98–111)
Creatinine, Ser: 0.92 mg/dL (ref 0.61–1.24)
GFR, Estimated: >60 mL/min (ref >60)
Glucose, Bld: 141 mg/dL — ABNORMAL HIGH (ref 70–99)
Potassium: 3.6 mmol/L (ref 3.5–5.1)
Sodium: 138 mmol/L (ref 135–145)

## 2020-01-25 LAB — MAGNESIUM: Magnesium: 2.2 mg/dL (ref 1.7–2.4)

## 2020-01-25 LAB — PHOSPHORUS: Phosphorus: 3 mg/dL (ref 2.5–4.6)

## 2020-01-25 MED ORDER — PROSOURCE TF PO LIQD
90.0000 mL | Freq: Three times a day (TID) | ORAL | Status: DC
  Filled 2020-01-25: qty 90

## 2020-01-25 MED ORDER — VITAL 1.5 CAL PO LIQD: 1000.0000 mL | ORAL | Status: DC

## 2020-01-25 NOTE — TOC Progression Note (Signed)
Transition of Care [facility]) - Progression Note    Patient Details  Name: [name] MRN: [id number] Date of Birth: 1970/10/07  Transition of [facility]) CM/SW [place], [place] Phone Number: [phone] 01/25/2020, 3:01 PM  Clinical Narrative:     Patient has bed offer to [facility] in [place], pending insurance authorization.  Clinical progress notes sent this morning, still pending Speech swallow evaluation for final decision from [facility].  Expected Discharge Plan: Long Term Acute Care (LTAC) Barriers to Discharge: Continued Medical Work up  Expected Discharge Plan and Services Expected Discharge Plan: Long Term Acute Care (LTAC) In-house Referral: Clinical Social Work   Post Acute Care Choice: Long Term Acute Care (LTAC) Living arrangements for the past 2 months: Single Family Home                                       Social Determinants of Health (SDOH) Interventions    Readmission Risk Interventions Readmission Risk Prevention Plan 11/19/2018 07/30/2018 07/29/2018  Transportation Screening Complete Complete Complete  PCP or Specialist Appt within 3-5 Days - Complete Complete  HRI or [place] - Complete Complete  Social Work Consult for [place] Planning/Counseling - - Patient refused  Palliative Care Screening - Not Applicable Not Applicable  Medication Review Press photographer) Referral to Pharmacy Complete Complete  PCP or Specialist appointment within 3-5 days of discharge Complete - -  [place] or Home Care Consult Patient refused - -  SW Recovery Care/Counseling Consult Complete - -  Palliative Care Screening Not Applicable - -  [place] Not Applicable - -  Some recent data might be hidden

## 2020-01-25 NOTE — Evaluation (Signed)
Clinical/Bedside Swallow Evaluation Patient Details  Name: [name] MRN: [id number] Date of Birth: May 01, 1970  Today's Date: 01/25/2020 Time: SLP Start Time (ACUTE ONLY): 39 SLP Stop Time (ACUTE ONLY): 1450 SLP Time Calculation (min) (ACUTE ONLY): 40 min  Past Medical History:  Past Medical History:  Diagnosis Date  . CHF (congestive heart failure) (HCC)    EF 45-50%- 2020  . CKD (chronic kidney disease) stage 3, GFR 30-59 ml/min (HCC)   . Clear cell renal cell carcinoma (HCC)    s/p partial nephrectomy  . COPD (chronic obstructive pulmonary disease) ([place])   . Coronary artery disease   . Diabetes mellitus without complication ([place])   . Hypercholesteremia unk  . Hypertension   . Myocardial infarction ([place])   . Sarcoidosis   . Sleep apnea    does not use CPAP regularly.  . Stroke [facility])    Past Surgical History:  Past Surgical History:  Procedure Laterality Date  . APPENDECTOMY    . PARTIAL NEPHRECTOMY    . TRACHEOSTOMY TUBE PLACEMENT N/A 01/14/2020   Procedure: TRACHEOSTOMY;  Surgeon: Clyde Canterbury, MD;  Location: [facility] ORS;  Service: ENT;  Laterality: N/A;   HPI:  Pt is a 49 y.o. 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  Previous h/o [facility] AND LTACH, admitted with acute on chronic mixed respiratory failure requiring intubation then tracheostomy on 01/14/2020. Pt A/O x4.   Assessment / Plan / Recommendation Clinical Impression  BSE completed w/ NG present. Pt appeared to present w/ grossly adequate oropharyngeal phase swallowing function w/ no immediate, clinical s/s of aspiration noted. Pt given verbal cues for follow through w/ general aspiration precautions. He would benefit from setup support and positioning  upright(chair) during meals. Pt consumed several ozs of each consistency w/ no decline in O2 sats/respiratory presentation, vocal quality, or discomfort. Oral phase appeared grossly WFL. OM exam appeared [facility]. Pt fed self w/ setup support. PMV was in place for all oral intake. Precautions posted in room. MD/NSG and Wife updated on above and recommendations. SLP Visit Diagnosis: Dysphagia, unspecified (R13.10)    Aspiration Risk   (reduced following precautions; tracheostomy)    Diet Recommendation  Mech Soft diet w/ thin liquids; aspiration precautions. PT MUST WEAR PMV DURING ALL ORAL INTAKE. Assist w/ PMV placement.   Medication Administration: Whole meds with puree (for easier swallowing at this time)    Other  Recommendations Recommended Consults:  (Dietician f/u for support) Oral Care Recommendations: Oral care BID;Oral care before and after PO;Patient independent with oral care Other Recommendations:  (n/a)   Follow up Recommendations None      Frequency and Duration min 2x/week  1 week       Prognosis Prognosis for Safe Diet Advancement: Good Barriers to Reach Goals:  (n/a) Barriers/Prognosis Comment: tracheostomy      Swallow Study   General Date of Onset: 01/02/20 HPI: Pt is a 49 y.o. 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  Previous h/o [facility] AND LTACH, admitted with acute on chronic mixed respiratory failure requiring intubation then tracheostomy on 01/14/2020. Pt A/O x4. Type of Study: Bedside Swallow  Evaluation Previous Swallow Assessment: 2017 Diet Prior to this Study: NPO;NG Tube (regular diet at home prior) Temperature Spikes Noted: No (wbc 5.6) Respiratory Status: Trach Collar (8L) History of Recent Intubation:  Yes Length of Intubations (days): 13 days Date extubated: 01/14/20 (tracheostomy) Behavior/Cognition: Alert;Cooperative;Pleasant mood (writing to communicate d/t aphonia) Oral Cavity Assessment: Within Functional Limits;Dry (min) Oral Care Completed by SLP: Yes Oral Cavity - Dentition: Adequate natural dentition Vision: Functional for self-feeding Self-Feeding Abilities: Able to feed self Patient Positioning: Upright in chair (supported w/ pillows) Baseline Vocal Quality: Hoarse;Low vocal intensity (min(baseline per pt); PMV placed for po trials) Volitional Cough: Strong Volitional Swallow: Able to elicit    Oral/Motor/Sensory Function Overall Oral Motor/Sensory Function: Within functional limits   Ice Chips Ice chips: Within functional limits Presentation: Spoon (fed; 5 trials)   Thin Liquid Thin Liquid: Within functional limits Presentation: Cup;Self Fed (~6+ ozs)    Nectar Thick Nectar Thick Liquid: Not tested   Honey Thick Honey Thick Liquid: Not tested   Puree Puree: Within functional limits Presentation: Self Fed;Spoon (4 ozs)   Solid     Solid: Within functional limits Presentation: Self Fed;Spoon (5 trials)       Orinda Kenner, MS, CCC-SLP Speech Language Pathologist Rehab Services [phone] Jazmina Muhlenkamp 01/25/2020,6:05 PM

## 2020-01-25 NOTE — Progress Notes (Addendum)
Physical Therapy Treatment Patient Details Name: [name] MRN: [id number] DOB: 06-09-1970 Today's Date: 01/25/2020    History of Present Illness Pt admitted for acute resp failure. History includes severe COPD and sarcoidosis. Of note, stay complicated by intubation from 11/27-11/29; then reintubated 11/29-12/9/21. Chronic O2 on 2L of O2 at baseline. Pt with NG tube and trach.    PT Comments    Patient is making progress with PT intervention. Patient was able to ambulate 4 ft with bariatric rolling walker with Min guard assistance. Verbal cues for technique. Standing activity tolerance limited to ~ 1 minute with LE fatigue with standing activity. Patient performed LE therapeutic exercises in seated position. Sp02 90-91% with functional activity with 8L02 via trach. Recommend to continue PT to maximize function and address remaining functional limitations.    Follow Up Recommendations  LTACH     Equipment Recommendations   (bariatric rolling walker)    Recommendations for Other Services       Precautions / Restrictions Precautions Precautions: Fall Precaution Comments: trach, NG tube Restrictions Weight Bearing Restrictions: No    Mobility  Bed Mobility Overal bed mobility: Needs Assistance Bed Mobility: Sit to Supine     Supine to sit: Min assist;HOB elevated     General bed mobility comments: minimal assistance for trunk support. verbal cues for  sequencing. extra time required to complete tasks  Transfers Overall transfer level: Needs assistance   Transfers: Sit to/from Stand Sit to Stand: Min guard         General transfer comment: Min guard and occasional cues for hand placement and safety  Ambulation/Gait Ambulation/Gait assistance: Min guard Gait Distance (Feet): 4 Feet Assistive device: Rolling walker (2 wheeled) (bariatric rolling walker) Gait Pattern/deviations: Step-through pattern Gait velocity: decreased   General Gait Details: Min guard  for safety with ambulating a short bout in the room. verbal cues for technique using rolling walker for support. Sp02 90% after walking and patient reports LE fatigue with standing activity. recommend to progress ambulation with chair follow next session as appropriate   Stairs             Wheelchair Mobility    Modified Rankin (Stroke Patients Only)       Balance Overall balance assessment: Needs assistance Sitting-balance support: Single extremity supported Sitting balance-Leahy Scale: Fair     Standing balance support: Bilateral upper extremity supported Standing balance-Leahy Scale: Fair Standing balance comment: patient relying on rolling walker for UE support in standing position                            Cognition Arousal/Alertness: Awake/alert Behavior During Therapy: WFL for tasks assessed/performed Overall Cognitive Status: Within Functional Limits for tasks assessed                                        Exercises General Exercises - Lower Extremity Long Arc Quad: AROM;AAROM;Strengthening;10 reps;Seated (AAROM for LLE, AROM RLE) Hip Flexion/Marching: AAROM;Strengthening;Both;10 reps;Seated Other Exercises Other Exercises: verbal and tactile cues for exercise technique. Sp02 90% with seated exercises with no significant increased work of breathing noted    General Comments        Pertinent Vitals/Pain Pain Assessment: No/denies pain    Home Living  Prior Function            PT Goals (current goals can now be found in the care plan section) Acute Rehab PT Goals Patient Stated Goal: to have trach removed before going home PT Goal Formulation: With patient Time For Goal Achievement: 02/03/20 Potential to Achieve Goals: Fair Progress towards PT goals: Progressing toward goals    Frequency    Min 2X/week      PT Plan Current plan remains appropriate    Co-evaluation               AM-PAC PT "6 Clicks" Mobility   Outcome Measure  Help needed turning from your back to your side while in a flat bed without using bedrails?: A Little Help needed moving from lying on your back to sitting on the side of a flat bed without using bedrails?: A Lot Help needed moving to and from a bed to a chair (including a wheelchair)?: A Lot Help needed standing up from a chair using your arms (e.g., wheelchair or bedside chair)?: A Lot Help needed to walk in hospital room?: A Lot Help needed climbing 3-5 steps with a railing? : A Lot 6 Click Score: 13    End of Session Equipment Utilized During Treatment: Oxygen Activity Tolerance: Patient tolerated treatment well Patient left: in chair;with call bell/phone within reach   PT Visit Diagnosis: Muscle weakness (generalized) (M62.81);Difficulty in walking, not elsewhere classified (R26.2)     Time: 4935-5217 PT Time Calculation (min) (ACUTE ONLY): 23 min  Charges:  $Therapeutic Exercise: 8-22 mins $Therapeutic Activity: 8-22 mins                     Minna Merritts, PT, MPT    Percell Locus 01/25/2020, 2:04 PM

## 2020-01-25 NOTE — Progress Notes (Signed)
Nutrition Follow-up  DOCUMENTATION CODES:   Obesity unspecified  INTERVENTION:  Initiate new goal TF regimen of Vital 1.5 Cal at 60 mL/hr (1440 mL goal daily volume) + PROSource 90 mL TID per tube. Provides 2400 kcal, 163 grams of protein, 1094 mL H2O daily.  NUTRITION DIAGNOSIS:   Inadequate oral intake related to inability to eat as evidenced by NPO status.  Ongoing.  GOAL:   Patient will meet greater than or equal to 90% of their needs  Met with TF regimen.  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  11/27 intubated 11/29 extubated then later re-intubated 12/4 TFs held s/p emesis 12/6 TFs resumed 12/9 s/p tracheostomy tube placement 12/12 TFs held s/p emesis and patient with mild ileus per abdominal x-ray 12/12 12/14 trickle TFs resumed 12/15 TFs advanced back to goal  Patient has been weaning to tracheostomy collar. Patient tolerated trach collar all night last night. He is on trach collar at time of RD assessment and alert. He reports he is tolerating tube feeds well. Denies nausea or abdominal pain.   Medications reviewed and include: Colace 100 mg BID, free water flush 200 mL Q4hrs, Novolog 0-20 units TID, Reglan 5 mg Q8hrs IV, MVI daily, Protonix, Miralax 17 grams daily, sennosides.  Labs reviewed: CBG 101-136, Chloride 97, CO2 34, BUN 24.  I/O: 3675 mL UOP yesterday (1.3 mL/kg/hr)  Weight trend: 117.1 kg on 12/17; -1.7 kg from 11/27  Enteral Access: 18 Fr. NGT placed 12/10; terminates in stomach per abdominal x-ray 12/12  TF regimen: Vital High Protein at 60 mL/hr +  PROSource TF 90 mL BID  Discussed with RN and on rounds. Plan is for SLP consult. Plan for LTAC pending insurance authorization.  Diet Order:   Diet Order            Diet NPO time specified  Diet effective now                EDUCATION NEEDS:   No education needs have been identified at this time  Skin:  Skin Assessment: Skin Integrity Issues: Skin Integrity Issues:: Incisions Incisions: closed incision to neck  Last BM:  01/25/2020 - small type 6  Height:   Ht Readings from Last 1 Encounters:  01/13/20 6' 0.99" (1.854 m)   Weight:   Wt Readings from Last 1 Encounters:  01/22/20 117.1 kg   Ideal Body Weight:  83.6 kg  BMI:  Body mass index is 34.07 kg/m.  Estimated Nutritional Needs:   Kcal:  2400-2600  Protein:  155-165 grams  Fluid:  2 L/day  Jacklynn Barnacle, MS, RD, LDN Pager number available on Amion

## 2020-01-25 NOTE — Progress Notes (Signed)
CRITICAL CARE NOTE  49 y.o.male.with medical history significant ofchronic combined systolic and diastolic CHF, COPD, CAD, history of MI, type 2 diabetes, hyperlipidemia, hypertension, sarcoidosis, sleep apnea not using Trilogy vent regularly, he has chronic hypercapnic respiratory failure due to end-stage lung disease.Previous h/o [facility]: [facility] AND LTACH, admitted with acute on chronic mixed respiratory failure requiring intubation PCCM consulted.  11/27 ICU admission, INTUBATED #8ETT 11/28 severe resp failure, progressive Renal failure, Nephrology consulted 11/29 - patient passed SBT today, able to communicate/follow directions while intubated status post extubation to BIPAP - after 1 hour on BIPAP patient became unresponsive and required re-intubation. I have met with mother in law at bedside and reviewed careplan.  11/30- patient remains on ventilator. He has thickened mucopurulent tracheal secretions, he can follow verbal instructions but failed SBT today. GPC+ tracheal aspirate culture. 01/06/20-SBT for extubation, failed with heavy secretions. TOC consult today.  01/07/20-patient failed SBT after 10 minutes with acute hypoxemia. PALS consult for VDRF. Spoke with Wife Patrice about trache she wants to wait for several days 01/08/20:Palliative care discussing with family, likelihood patient will remain ventilator dependent, continues to fail SBT's has had prior episode of trach and LTACH stay. 01/10/2020:Tolerating SBT's better on current sedation Precedex/fentanyl-calm, communicates with ICU communication board tool 12/6 severe end stage COPD, will need trach, wife updated 12/7-12/8 failure to wean from vent 12/9 s/p trach 12/13 bleeding from trach site, heparin stopped 12/14 Bleeding from trach wound appears to be superficial oozing from left side of wound 12/15 failed SAT/SBT increased WOB, increased secretions 01/25/20- patient is awake and alert , was able to use urinal on his own  during interview. He is comfortable on ventilator but unable to breathe spontaneously today.     CC  follow up respiratory failure  SUBJECTIVE Patient remains ill Prognosis is guarded S/p trach Alert and awake On full vent support    BP 140/86 (BP Location: Right Arm)   Pulse 86   Temp 98.6 F (37 C) (Oral)   Resp 16   Ht 6' 0.99" (1.854 m)   Wt 117.1 kg   SpO2 96%   BMI 34.07 kg/m    I/O last 3 completed shifts: In: -  Out: 4475 [Urine:4475] Total I/O In: 328 [NG/GT:328] Out: 650 [Urine:650]  SpO2: 96 % O2 Flow Rate (L/min): 8 L/min FiO2 (%): 35 %  Estimated body mass index is 34.07 kg/m as calculated from the following:   Height as of this encounter: 6' 0.99" (1.854 m).   Weight as of this encounter: 117.1 kg.  SIGNIFICANT EVENTS +SOB   PHYSICAL EXAMINATION:  GENERAL:chronically ill HEAD: Normocephalic, atraumatic.  EYES: Pupils equal, round, reactive to light.  No scleral icterus.  MOUTH: Moist mucosal membrane. +trache NECK: Supple.  PULMONARY: +rhonchi,  CARDIOVASCULAR: S1 and S2. Regular rate and rhythm. No murmurs, rubs, or gallops.  GASTROINTESTINAL: Soft, nontender, -distended.  Positive bowel sounds.   MUSCULOSKELETAL: No swelling, clubbing, or edema.  NEUROLOGIC: no focal defecits SKIN:intact,warm,dry  MEDICATIONS: I have reviewed all medications and confirmed regimen as documented   CULTURE RESULTS   Recent Results (from the past 240 hour(s))  Culture, respiratory (non-expectorated)     Status: None   Collection Time: 01/17/20 12:13 PM   Specimen: SPU; Respiratory  Result Value Ref Range Status   Specimen Description   Final    SPUTUM Performed at [facility], [address] [place], [place] [address]    Special Requests   Final    NONE Performed at [facility], [address] [place], [place] [address]    Gram Stain   Final    FEW WBC PRESENT, PREDOMINANTLY PMN FEW GRAM NEGATIVE RODS RARE GRAM  POSITIVE COCCI    Culture   Final    FEW Normal respiratory flora-no Staph aureus or Pseudomonas seen Performed at [facility] [address] [place], [place] [address]    Report Status 01/20/2020 FINAL  Final  Culture, respiratory (non-expectorated)     Status: None   Collection Time: 01/21/20 11:29 AM   Specimen: Tracheal Aspirate; Respiratory  Result Value Ref Range Status   Specimen Description   Final    TRACHEAL ASPIRATE Performed at [facility], [address], [place], [place] [address]    Special Requests   Final    NONE Performed at [facility], [place], [place] [address]    Gram Stain   Final    ABUNDANT WBC PRESENT,BOTH PMN AND MONONUCLEAR NO ORGANISMS SEEN    Culture   Final    RARE Normal respiratory flora-no Staph aureus or Pseudomonas seen Performed at [place] [address], [place], [place] [address]    Report Status 01/23/2020 FINAL  Final          IMAGING    No results found.   Nutrition Status: Nutrition Problem: Inadequate oral intake Etiology: inability to eat Signs/Symptoms: NPO status Interventions: Tube feeding,Prostat,MVI     Indwelling Urinary Catheter continued, requirement due to   Reason to continue Indwelling Urinary Catheter strict Intake/Output monitoring for hemodynamic instability         Ventilator continued, requirement due to severe respiratory failure     ASSESSMENT AND PLAN SYNOPSIS   Acute on chronic respiratory failure (hypoxia/hypercarbia) Underlying severe COPD and sarcoidosis/Atelectasis/Severe obstructive sleep apnea -Continue vent support -Wean fio2 -tolerating PS mode -S/p Trach on 01/14/20 (to remain indefinitely per ENT due to severe scarring)    Severe ACUTE Hypoxic and Hypercapnic Respiratory Failure Wean to PS as tolerated   Morbid obesity, +OSA.   S/p trach    CARDIAC ICU monitoring  ID -continue IV abx as  prescibed -follow up cultures  GI GI PROPHYLAXIS as indicated  NUTRITIONAL STATUS Nutrition Status: Nutrition Problem: Inadequate oral intake Etiology: inability to eat Signs/Symptoms: NPO status Interventions: Tube feeding,Prostat,MVI   DIET-->TF's as tolerated Constipation protocol as indicated  ENDO - will use ICU hypoglycemic\Hyperglycemia protocol if indicated     ELECTROLYTES -follow labs as needed -replace as needed -pharmacy consultation and following   DVT/GI PRX ordered and assessed TRANSFUSIONS AS NEEDED MONITOR FSBS I Assessed the need for Labs I Assessed the need for Foley I Assessed the need for Central Venous Line Family Discussion when available I Assessed the need for Mobilization I made an Assessment of medications to be adjusted accordingly Safety Risk assessment completed   CASE DISCUSSED IN MULTIDISCIPLINARY ROUNDS WITH ICU TEAM  Critical Care Time devoted to patient care services described in this note is 32 minutes.   Overall, patient is critically ill, prognosis is guarded.      Ottie Glazier, M.D.  Pulmonary & [place]

## 2020-01-25 NOTE — Evaluation (Signed)
[facility] - Evaluation Patient Details  Name: [name] MRN: [id number] Date of Birth: 10-Jun-1970  Today's Date: 01/25/2020 Time: 1335-1415 SLP Time Calculation (min) (ACUTE ONLY): 40 min  Past Medical History:  Past Medical History:  Diagnosis Date  . CHF (congestive heart failure) (HCC)    EF 45-50%- 2020  . CKD (chronic kidney disease) stage 3, GFR 30-59 ml/min (HCC)   . Clear cell renal cell carcinoma (HCC)    s/p partial nephrectomy  . COPD (chronic obstructive pulmonary disease) ([place])   . Coronary artery disease   . Diabetes mellitus without complication ([place])   . Hypercholesteremia unk  . Hypertension   . Myocardial infarction ([facility])   . Sarcoidosis   . Sleep apnea    does not use CPAP regularly.  . Stroke [facility])    Past Surgical History:  Past Surgical History:  Procedure Laterality Date  . APPENDECTOMY    . PARTIAL NEPHRECTOMY    . TRACHEOSTOMY TUBE PLACEMENT N/A 01/14/2020   Procedure: TRACHEOSTOMY;  Surgeon: Clyde Canterbury, MD;  Location: [facility] ORS;  Service: ENT;  Laterality: N/A;   HPI:  Pt is a 49 y.o. 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  Previous h/o [facility] AND LTACH, admitted with acute on chronic mixed respiratory failure requiring intubation then tracheostomy on 01/14/2020. Pt A/O x4.   Assessment / Plan / Recommendation Clinical Impression  Pt was seen for PMV assessment and toleration of wear for verbal communication. He tolerated PMV placement for ~30+ mins w/out desat or discomfort reported or noted by this SLP. He verbally communicated wants/needs w/ others including talking w/ Wife who was present in room later at end of eval. Noted good insight and follow through w/ PMV wear/use. He  requires assistance currently w/ placement of PMV. PMV left in place for BSE and later communication w/ Wife, NSG will monitor toleration of wear and remove when sleepy, or indicated. Precautions posted in room, chart. MD/NSG updated. SLP Visit Diagnosis: Aphonia (R49.1) (tracheostomy)    SLP Assessment  Patient needs continued Speech Lanaguage Pathology Services    Follow Up Recommendations  None    Frequency and Duration min 2x/week  1 week    PMSV Trial PMSV was placed for: 35 mins Able to redirect subglottic air through upper airway: Yes Able to Attain Phonation: Yes Voice Quality: Hoarse (min - baseline per pt) Able to Expectorate Secretions: No attempts Level of Secretion Expectoration with PMSV: Not observed Breath Support for Phonation: Adequate Intelligibility: Intelligible Respirations During Trial: 24 SpO2 During Trial: 97 % Pulse During Trial: 81 Behavior: Alert;Expresses self well;Cooperative;Good eye contact;Responsive to questions   Tracheostomy Tube  Additional Tracheostomy Tube Assessment Trach Collar Period: daytime Secretion Description: min Frequency of Tracheal Suctioning: PRN Level of Secretion Expectoration: Tracheal    Vent Dependency  Vent Dependent: Yes (night) FiO2 (%): 35 % Weaning Trials: Yes (daytime) Nocturnal Vent: Yes    Cuff Deflation Trial  GO Tolerated Cuff Deflation: Yes Length of Time for Cuff Deflation Trial: daytime Behavior: Alert;Expresses self well;Good eye contact;Oriented X3;Responsive to questions;Smiling Cuff Deflation Trial - Comments: baseline deflated        Michell Kader 01/25/2020, 6:14 PM Orinda Kenner, MS, [place] Speech Language Pathologist Rehab Services [phone]

## 2020-01-25 NOTE — Progress Notes (Signed)
CRITICAL CARE NOTE  49 y.o.male.with medical history significant ofchronic combined systolic and diastolic CHF, COPD, CAD, history of MI, type 2 diabetes, hyperlipidemia, hypertension, sarcoidosis, sleep apnea not using Trilogy vent regularly, he has chronic hypercapnic respiratory failure due to end-stage lung disease.Previous h/o [facility] AND LTACH, admitted with acute on chronic mixed respiratory failure requiring intubation PCCM consulted.  11/27 ICU admission, INTUBATED #8ETT 11/28 severe resp failure, progressive Renal failure, Nephrology consulted 11/29 - patient passed SBT today, able to communicate/follow directions while intubated status post extubation to BIPAP - after 1 hour on BIPAP patient became unresponsive and required re-intubation. I have met with mother in law at bedside and reviewed careplan.  11/30- patient remains on ventilator. He has thickened mucopurulent tracheal secretions, he can follow verbal instructions but failed SBT today. GPC+ tracheal aspirate culture. 01/06/20-SBT for extubation, failed with heavy secretions. TOC consult today.  01/07/20-patient failed SBT after 10 minutes with acute hypoxemia. PALS consult for VDRF. Spoke with Wife Patrice about trache she wants to wait for several days 01/08/20:Palliative care discussing with family, likelihood patient will remain ventilator dependent, continues to fail SBT's has had prior episode of trach and LTACH stay. 01/10/2020:Tolerating SBT's better on current sedation Precedex/fentanyl-calm, communicates with ICU communication board tool 12/6 severe end stage COPD, will need trach, wife updated 12/7-12/8 failure to wean from vent 12/9 s/p trach 12/13 bleeding from trach site, heparin stopped 12/14 Bleeding from trach wound appears to be superficial oozing from left side of wound 12/15 failed SAT/SBT increased WOB, increased secretions 01/25/20- patient is awake and alert , was able to use urinal on his own  during interview. He is comfortable on ventilator but unable to breathe spontaneously today.     CC  follow up respiratory failure  SUBJECTIVE Patient remains ill Prognosis is guarded S/p trach Alert and awake On full vent support    BP 139/80   Pulse 80   Temp 98.5 F (36.9 C) (Oral)   Resp 12   Ht 6' 0.99" (1.854 m)   Wt 117.1 kg   SpO2 93%   BMI 34.07 kg/m    I/O last 3 completed shifts: In: -  Out: 4475 [Urine:4475] Total I/O In: 328 [NG/GT:328] Out: 650 [Urine:650]  SpO2: 93 % O2 Flow Rate (L/min): 8 L/min FiO2 (%): 35 %  Estimated body mass index is 34.07 kg/m as calculated from the following:   Height as of this encounter: 6' 0.99" (1.854 m).   Weight as of this encounter: 117.1 kg.  SIGNIFICANT EVENTS +SOB   PHYSICAL EXAMINATION:  GENERAL:chronically ill HEAD: Normocephalic, atraumatic.  EYES: Pupils equal, round, reactive to light.  No scleral icterus.  MOUTH: Moist mucosal membrane. +trache NECK: Supple.  PULMONARY: +rhonchi,  CARDIOVASCULAR: S1 and S2. Regular rate and rhythm. No murmurs, rubs, or gallops.  GASTROINTESTINAL: Soft, nontender, -distended.  Positive bowel sounds.   MUSCULOSKELETAL: No swelling, clubbing, or edema.  NEUROLOGIC: no focal defecits SKIN:intact,warm,dry  MEDICATIONS: I have reviewed all medications and confirmed regimen as documented   CULTURE RESULTS   Recent Results (from the past 240 hour(s))  Culture, respiratory (non-expectorated)     Status: None   Collection Time: 01/17/20 12:13 PM   Specimen: SPU; Respiratory  Result Value Ref Range Status   Specimen Description   Final    SPUTUM Performed at [facility], [address] [place], [place] [address]    Special Requests   Final    NONE Performed at [facility], [address] [place], [place] [address]    Gram Stain   Final    FEW WBC PRESENT, PREDOMINANTLY PMN FEW GRAM NEGATIVE RODS RARE GRAM POSITIVE COCCI    Culture    Final    FEW Normal respiratory flora-no Staph aureus or Pseudomonas seen Performed at [facility] [address] [place], [place] [address]    Report Status 01/20/2020 FINAL  Final  Culture, respiratory (non-expectorated)     Status: None   Collection Time: 01/21/20 11:29 AM   Specimen: Tracheal Aspirate; Respiratory  Result Value Ref Range Status   Specimen Description   Final    TRACHEAL ASPIRATE Performed at [facility], [address] [place], [place] [address]    Special Requests   Final    NONE Performed at [facility], [place], [place] [address]    Gram Stain   Final    ABUNDANT WBC PRESENT,BOTH PMN AND MONONUCLEAR NO ORGANISMS SEEN    Culture   Final    RARE Normal respiratory flora-no Staph aureus or Pseudomonas seen Performed at [facility] [address] [place], [place] [address]    Report Status 01/23/2020 FINAL  Final          IMAGING    No results found.   Nutrition Status: Nutrition Problem: Inadequate oral intake Etiology: inability to eat Signs/Symptoms: NPO status Interventions: Tube feeding,Prostat,MVI     Indwelling Urinary Catheter continued, requirement due to   Reason to continue Indwelling Urinary Catheter strict Intake/Output monitoring for hemodynamic instability         Ventilator continued, requirement due to severe respiratory failure     ASSESSMENT AND PLAN SYNOPSIS   Acute on chronic respiratory failure (hypoxia/hypercarbia) Underlying severe COPD and sarcoidosis/Atelectasis/Severe obstructive sleep apnea -Continue vent support -Wean fio2 -tolerating PS mode -S/p Trach on 01/14/20 (to remain indefinitely per ENT due to severe scarring)    Severe ACUTE Hypoxic and Hypercapnic Respiratory Failure Wean to PS as tolerated   Morbid obesity, +OSA.   S/p trach    CARDIAC ICU monitoring  ID -continue IV abx as prescibed -follow up cultures  GI GI  PROPHYLAXIS as indicated  NUTRITIONAL STATUS Nutrition Status: Nutrition Problem: Inadequate oral intake Etiology: inability to eat Signs/Symptoms: NPO status Interventions: Tube feeding,Prostat,MVI   DIET-->TF's as tolerated Constipation protocol as indicated  ENDO - will use ICU hypoglycemic\Hyperglycemia protocol if indicated     ELECTROLYTES -follow labs as needed -replace as needed -pharmacy consultation and following   DVT/GI PRX ordered and assessed TRANSFUSIONS AS NEEDED MONITOR FSBS I Assessed the need for Labs I Assessed the need for Foley I Assessed the need for Central Venous Line Family Discussion when available I Assessed the need for Mobilization I made an Assessment of medications to be adjusted accordingly Safety Risk assessment completed   CASE DISCUSSED IN MULTIDISCIPLINARY ROUNDS WITH ICU TEAM  Critical Care Time devoted to patient care services described in this note is 32 minutes.   Overall, patient is critically ill, prognosis is guarded.      Ottie Glazier, M.D.  Pulmonary & [place]

## 2020-01-26 ENCOUNTER — Inpatient Hospital Stay
Admission: AD | Admit: 2020-01-26 | Discharge: 2020-02-16 | Disposition: A | Discharge status: Home / Self Care | Payer: Medicare HMO | Source: Other Acute Inpatient Hospital | Attending: Internal Medicine | Admitting: Internal Medicine

## 2020-01-26 ENCOUNTER — Ambulatory Visit (HOSPITAL_COMMUNITY)
Admission: AD | Admit: 2020-01-26 | Discharge: 2020-01-26 | Disposition: A | Discharge status: Home / Self Care | Payer: Medicare HMO | Source: Other Acute Inpatient Hospital | Attending: Internal Medicine | Admitting: Internal Medicine

## 2020-01-26 DIAGNOSIS — J9690 Respiratory failure, unspecified, unspecified whether with hypoxia or hypercapnia: Secondary | ICD-10-CM

## 2020-01-26 DIAGNOSIS — G4733 Obstructive sleep apnea (adult) (pediatric): Secondary | ICD-10-CM | POA: Diagnosis present

## 2020-01-26 DIAGNOSIS — Z930 Tracheostomy status: Secondary | ICD-10-CM

## 2020-01-26 DIAGNOSIS — J9600 Acute respiratory failure, unspecified whether with hypoxia or hypercapnia: Secondary | ICD-10-CM | POA: Insufficient documentation

## 2020-01-26 DIAGNOSIS — J9622 Acute and chronic respiratory failure with hypercapnia: Secondary | ICD-10-CM | POA: Diagnosis present

## 2020-01-26 DIAGNOSIS — D860 Sarcoidosis of lung: Secondary | ICD-10-CM | POA: Diagnosis present

## 2020-01-26 DIAGNOSIS — J449 Chronic obstructive pulmonary disease, unspecified: Secondary | ICD-10-CM

## 2020-01-26 DIAGNOSIS — J9621 Acute and chronic respiratory failure with hypoxia: Secondary | ICD-10-CM | POA: Diagnosis present

## 2020-01-26 HISTORY — DX: Obstructive sleep apnea (adult) (pediatric): G47.33

## 2020-01-26 HISTORY — DX: Acute and chronic respiratory failure with hypoxia: J96.21

## 2020-01-26 HISTORY — DX: Chronic obstructive pulmonary disease, unspecified: J44.9

## 2020-01-26 HISTORY — DX: Tracheostomy status: Z93.0

## 2020-01-26 HISTORY — DX: Sarcoidosis of lung: D86.0

## 2020-01-26 LAB — CBC WITH DIFFERENTIAL/PLATELET
Abs Immature Granulocytes: 0.02 10*3/uL (ref 0.00–0.07)
Basophils Absolute: 0 10*3/uL (ref 0.0–0.1)
Basophils Relative: 1 %
Eosinophils Absolute: 0.1 10*3/uL (ref 0.0–0.5)
Eosinophils Relative: 2 %
HCT: 36.5 % — ABNORMAL LOW (ref 39.0–52.0)
Hemoglobin: 11.7 g/dL — ABNORMAL LOW (ref 13.0–17.0)
Immature Granulocytes: 0 %
Lymphocytes Relative: 19 %
Lymphs Abs: 1.2 10*3/uL (ref 0.7–4.0)
MCH: 28.2 pg (ref 26.0–34.0)
MCHC: 32.1 g/dL (ref 30.0–36.0)
MCV: 88 fL (ref 80.0–100.0)
Monocytes Absolute: 1 10*3/uL (ref 0.1–1.0)
Monocytes Relative: 16 %
Neutro Abs: 3.9 10*3/uL (ref 1.7–7.7)
Neutrophils Relative %: 62 %
Platelets: 293 10*3/uL (ref 150–400)
RBC: 4.15 MIL/uL — ABNORMAL LOW (ref 4.22–5.81)
RDW: 11.9 % (ref 11.5–15.5)
WBC: 6.3 10*3/uL (ref 4.0–10.5)
nRBC: 0 % (ref 0.0–0.2)

## 2020-01-26 LAB — PHOSPHORUS: Phosphorus: 3.7 mg/dL (ref 2.5–4.6)

## 2020-01-26 LAB — BASIC METABOLIC PANEL
Anion gap: 7 (ref 5–15)
BUN: 20 mg/dL (ref 6–20)
CO2: 35 mmol/L — ABNORMAL HIGH (ref 22–32)
Calcium: 8.8 mg/dL — ABNORMAL LOW (ref 8.9–10.3)
Chloride: 99 mmol/L (ref 98–111)
Creatinine, Ser: 1.03 mg/dL (ref 0.61–1.24)
GFR, Estimated: >60 mL/min (ref >60)
Glucose, Bld: 113 mg/dL — ABNORMAL HIGH (ref 70–99)
Potassium: 4.3 mmol/L (ref 3.5–5.1)
Sodium: 141 mmol/L (ref 135–145)

## 2020-01-26 LAB — MAGNESIUM: Magnesium: 2.3 mg/dL (ref 1.7–2.4)

## 2020-01-26 LAB — GLUCOSE, CAPILLARY
Glucose-Capillary: 176 mg/dL — ABNORMAL HIGH (ref 70–99)
Glucose-Capillary: 114 mg/dL — ABNORMAL HIGH (ref 70–99)

## 2020-01-26 MED ORDER — POLYETHYLENE GLYCOL 3350 17 G PO PACK: 17.0000 g | PACK | Freq: Every day | ORAL | Status: DC

## 2020-01-26 MED ORDER — ADULT MULTIVITAMIN W/MINERALS CH
1.0000 | ORAL_TABLET | Freq: Every day | ORAL | Status: DC
  Administered 2020-01-26: 09:00:00 1 via ORAL
  Filled 2020-01-26: qty 1

## 2020-01-26 MED ORDER — ONDANSETRON HCL 4 MG/2ML IJ SOLN: 4.0000 mg | Freq: Four times a day (QID) | INTRAMUSCULAR | Status: DC | PRN

## 2020-01-26 MED ORDER — SENNOSIDES 8.8 MG/5ML PO SYRP
5.0000 mL | ORAL_SOLUTION | Freq: Two times a day (BID) | ORAL | Status: DC
  Filled 2020-01-26 (×2): qty 5

## 2020-01-26 MED ORDER — DOCUSATE SODIUM 50 MG/5ML PO LIQD: 100.0000 mg | Freq: Two times a day (BID) | ORAL | Status: DC

## 2020-01-26 MED ORDER — ACETAMINOPHEN 650 MG RE SUPP: 650.0000 mg | Freq: Four times a day (QID) | RECTAL | Status: DC | PRN

## 2020-01-26 MED ORDER — APIXABAN 5 MG PO TABS
5.0000 mg | ORAL_TABLET | Freq: Two times a day (BID) | ORAL | Status: DC
  Administered 2020-01-26: 09:00:00 5 mg via ORAL
  Filled 2020-01-26: qty 1

## 2020-01-26 MED ORDER — ENSURE MAX PROTEIN PO LIQD
11.0000 [oz_av] | Freq: Two times a day (BID) | ORAL | Status: DC
  Filled 2020-01-26: qty 330

## 2020-01-26 MED ORDER — ACETAMINOPHEN 325 MG PO TABS: 650.0000 mg | ORAL_TABLET | Freq: Four times a day (QID) | ORAL | Status: DC | PRN

## 2020-01-26 MED ORDER — PANTOPRAZOLE SODIUM 40 MG PO TBEC
40.0000 mg | DELAYED_RELEASE_TABLET | Freq: Every day | ORAL | Status: DC
  Administered 2020-01-26: 09:00:00 40 mg via ORAL
  Filled 2020-01-26: qty 1

## 2020-01-26 MED ORDER — METOPROLOL TARTRATE 50 MG PO TABS
100.0000 mg | ORAL_TABLET | Freq: Every day | ORAL | Status: DC
Start: 2020-01-26 — End: 2020-01-26
  Administered 2020-01-26: 12:00:00 100 mg via ORAL
  Filled 2020-01-26: qty 2

## 2020-01-26 MED ORDER — ONDANSETRON HCL 4 MG PO TABS: 4.0000 mg | ORAL_TABLET | Freq: Four times a day (QID) | ORAL | Status: DC | PRN

## 2020-01-26 NOTE — Progress Notes (Signed)
Pt discharged to [facility] at this time. Report given to [employer]. Wife was called and updated on transfer. VSS prior to transfer on trach collar.

## 2020-01-26 NOTE — Progress Notes (Signed)
Speech Language Pathology Treatment: Nada Boozer Speaking valve;Dysphagia  Patient Details Name: [name] MRN: [id number] DOB: [name], [name] Today's Date: 01/26/2020 Time: 0825-0920 SLP Time Calculation (min) (ACUTE ONLY): 55 min  Assessment / Plan / Recommendation Clinical Impression  Pt seen today for ongoing assessment of toleration of oral diet; wear and care of PMV for verbal communication. O2 sats and HR/RR at his baseline. Pt was sitting EOB w/ his meal, then NSG giving Pills Whole in Puree. Pt had the PMV placed and communicated verbally w/ SLP and NSG in room.  Pt explained general aspiration precautions and agreed verbally to the need for following them especially sitting upright for all oral intake so he sits EOB if not sitting in the chair for his meals. Pt fed self his meal of thin liquids and soft solids. No overt clinical s/s of aspiration were noted w/ any consistency; respiratory status remained calm and unlabored, vocal quality clear b/t trials wearing the PMV. Pt drank via Cup and followed general aspiration precautions for single, small sips slowly. No straws were utiilized for better oral control. Pt was also given Pills Whole in Puree (w/ nsg present) which he tolerated well; the puree providing cohesion for swallowing tablets. Oral phase appeared [facility] for bolus management and timely A-P transfer for swallowing; oral clearing achieved w/ all consistencies.  Recommend continue a Regular/mech soft diet w/ gravies added to cut, moisten foods; Thin liquids via Cup. Recommend general aspiration precautions; Pills Whole in Puree; positioning assistance to chair or EOB for meals. ST services can be available for further swallowing needs while admitted. Suggested to pt diet could be upgraded as he feels he tolerates post d/c from hospital to include salads. NSG updated. Precautions posted at bedside. During pt's PMV use, pt exhibited adequate vocal quality for volume w/ slight  hoarseness; pt stated this was baseline. Pt has had a previous tracheostomy and declined Pulmonary status several years ago. Pt stated he had the PMV placed as soon as awakened and was comfortable wearing it during conversation, and meals. He stated he felt "better" sensation w/ it on. No changes in his O2 sats, HR/RR were noted during the sessions. Discussed wearing the PMV during other tx sessions in order to verbally communicate w/ therapists, NSG. Also encouraged pt to use an Chiropodist for "exercising" his Pulmonary support/strength for verbal communication and overall improvement of status/health to d/c the hospital. Pt agreed. Discussed care of the PMV this session; will address pt practicing placement of the [place] tomorrow. Pt agreed. MD/NSG updated on pt's status and progress w/ ST therapies.     HPI HPI: Pt is a 49 y.o. 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  Previous h/o [facility] AND LTACH, admitted with acute on chronic mixed respiratory failure requiring intubation then tracheostomy on 01/14/2020. Pt A/O x4.      SLP Plan  Continue with current plan of care (x1)       Recommendations  Diet recommendations: Dysphagia 3 (mechanical soft);Thin liquid;Regular (moist foods, cut small) Liquids provided via: Cup;No straw Medication Administration: Whole meds with puree (for ease of swallowing) Supervision: Patient able to self feed Compensations: Minimize environmental distractions;Slow rate;Small sips/bites Postural Changes and/or Swallow Maneuvers: Seated upright 90 degrees;Upright 30-60 min after meal;Out of bed for meals  Patient may use Passy-Muir Speech Valve: During all therapies with supervision;During all waking hours  (remove during sleep);During PO intake/meals PMSV Supervision: Intermittent MD: Please consider changing trach tube to : Smaller size (TBD by MD)         General recommendations: OT consult;PT consult Oral Care Recommendations: Oral care BID;Oral care before and after PO;Patient independent with oral care Follow up Recommendations: None SLP Visit Diagnosis: Aphonia (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

## 2020-01-26 NOTE — Progress Notes (Signed)
Physical Therapy Treatment Patient Details Name: [name] MRN: [id number] DOB: 1970-02-22 Today's Date: 01/26/2020    History of Present Illness Pt admitted for acute resp failure. History includes severe COPD and sarcoidosis. Of note, stay complicated by intubation from 11/27-11/29; then reintubated 11/29-12/9/21. Chronic O2 on 2L of O2 at baseline. Pt with NG tube and trach.    PT Comments    Pt in bed upon entry, RN assisting. Pt educated on HEP for bed with handout. Pt on 8L O2 via trach collar at entry, however, delivery unsuccessful due to O2 delivery device rotated, aimed at lateral neck. O2 apparatus adjusted. Pt exercises for 5 minutes 3x each on 8L, then 5L, then 3L, 95% SpO2 each time. RN made aware, gives ok to leave on 3L. Pt using PMV in session, about to easily voice needs, interact, cognition intact. Pt remain very weak still, requires min-modA to perform 50-75% of leg exercises, but is improving, progressing toward goals overall. Will continue to follow.     Follow Up Recommendations  LTACH;Supervision for mobility/OOB;Supervision - Intermittent     Equipment Recommendations  Rolling walker with 5" wheels    Recommendations for Other Services       Precautions / Restrictions Precautions Precautions: Fall Precaution Comments: trach Restrictions Weight Bearing Restrictions: No    Mobility  Bed Mobility Overal bed mobility: Needs Assistance             General bed mobility comments: maxA to scoot up in bed  Transfers Overall transfer level: Needs assistance Equipment used: Rolling walker (2 wheeled) (bari RW) Transfers: Sit to/from Stand Sit to Stand: Min guard;Min assist         General transfer comment: One cue for hand placement on CTS and then to reach back to descent to sitting.  Ambulation/Gait                 Stairs             Wheelchair Mobility    Modified Rankin (Stroke Patients Only)       Balance Overall  balance assessment: Needs assistance Sitting-balance support: Feet supported Sitting balance-Leahy Scale: Good     Standing balance support: Bilateral upper extremity supported Standing balance-Leahy Scale: Fair Standing balance comment: B UE support on RW                            Cognition Arousal/Alertness: Awake/alert Behavior During Therapy: WFL for tasks assessed/performed Overall Cognitive Status: Within Functional Limits for tasks assessed                                 General Comments: Pt with PMV today and improved communication freedom which he expresses has been very beneficial for his mental health.      Exercises Total Joint Exercises Bridges: AROM;Both;10 reps;Supine;Limitations Bridges Limitations: weak, only 25% ROM achieved General Exercises - Lower Extremity Ankle Circles/Pumps: AROM;Both;20 reps;Supine Quad Sets: AROM;Both;20 reps;Supine Short Arc Quad: AROM;Both;10 reps;Supine Heel Slides: AAROM;Both;10 reps;Supine;Limitations Heel Slides Limitations: weak, requires min-modA Hip ABduction/ADduction: AROM;Both;10 reps;Supine Straight Leg Raises: AAROM;Both;10 reps;Supine;Limitations Straight Leg Raises Limitations: weak, requires min-modA Other Exercises Other Exercises: UE pull 1x10 bilat Other Exercises: Ue push 1x10 bilat Other Exercises: chest press AA/ROM 1x10 bilat    General Comments General comments (skin integrity, edema, etc.): Pt on 8L trach collar throughout session. Tolerates session well with sats  and HR in safe range.      Pertinent Vitals/Pain Pain Assessment: No/denies pain    Home Living                      Prior Function            PT Goals (current goals can now be found in the care plan section) Acute Rehab PT Goals Patient Stated Goal: to get stronger with mobility and be able to do for myself PT Goal Formulation: With patient Time For Goal Achievement: 02/03/20 Potential to Achieve  Goals: Fair Progress towards PT goals: Progressing toward goals    Frequency    Min 2X/week      PT Plan Current plan remains appropriate    Co-evaluation              AM-PAC PT "6 Clicks" Mobility   Outcome Measure  Help needed turning from your back to your side while in a flat bed without using bedrails?: A Lot Help needed moving from lying on your back to sitting on the side of a flat bed without using bedrails?: A Lot Help needed moving to and from a bed to a chair (including a wheelchair)?: A Lot Help needed standing up from a chair using your arms (e.g., wheelchair or bedside chair)?: A Lot Help needed to walk in hospital room?: A Lot Help needed climbing 3-5 steps with a railing? : A Lot 6 Click Score: 12    End of Session Equipment Utilized During Treatment: Oxygen Activity Tolerance: Patient tolerated treatment well;No increased pain;Patient limited by fatigue Patient left: in bed;with call bell/phone within reach Nurse Communication: Mobility status (weened O2) PT Visit Diagnosis: Muscle weakness (generalized) (M62.81);Difficulty in walking, not elsewhere classified (R26.2)     Time: 4098-1191 PT Time Calculation (min) (ACUTE ONLY): 33 min  Charges:  $Therapeutic Exercise: 23-37 mins                     3:33 PM, 01/26/20 Etta Grandchild, PT, DPT Physical Therapist - [facility]  [phone] ([place])    Cinda Hara C 01/26/2020, 3:29 PM

## 2020-01-26 NOTE — Progress Notes (Signed)
Occupational Therapy Treatment Patient Details Name: [name] MRN: [id number] DOB: 08-Jul-1970 Today's Date: 01/26/2020    History of present illness Pt admitted for acute resp failure. History includes severe COPD and sarcoidosis. Of note, stay complicated by intubation from 11/27-11/29; then reintubated 11/29-12/9/21. Chronic O2 on 2L of O2 at baseline. Pt with NG tube and trach.   OT comments  Pt seen for OT tx this date to f/u re: safety with ADLs/ADL mobility. OT facilitates pt particiaption in UB bathing/dressing with SETUP and MIN A with LB bathing in standing as well as MIN/MOD A for LB dressing in sitting to don socks. In addition, pt requires CGA/MIN A Wtih Bari RW For ADL Transfers and fxl mobility. Pt with improved fxl activity tolerance and maintains good O2 saturation and HR throughout, even with extensive standing for LB bathing (~10 minutes). In addition, OT engages pt in below-listed seated exercises to improve strength for ADL transfers as well as improve surface area for quality of breathing. Pt tolerates session well. Left in chair with phone and call light in reach. Continue to anticipate that pt will require therapy follow up upon discharge, but he does continue to progress.    Follow Up Recommendations  LTACH;SNF (pt with improving tolerance, but still with extensive O2 needs-8L on trach collar)    Equipment Recommendations  Other (comment) (defer to next level of care)    Recommendations for Other Services      Precautions / Restrictions Precautions Precautions: Fall Precaution Comments: trach Restrictions Weight Bearing Restrictions: No       Mobility Bed Mobility               General bed mobility comments: pt recieved seated EOB when OT presents  Transfers Overall transfer level: Needs assistance Equipment used: Rolling walker (2 wheeled) (bari RW) Transfers: Sit to/from Stand Sit to Stand: Min guard;Min assist         General transfer  comment: One cue for hand placement on CTS and then to reach back to descent to sitting.    Balance Overall balance assessment: Needs assistance Sitting-balance support: Feet supported Sitting balance-Leahy Scale: Good     Standing balance support: Bilateral upper extremity supported Standing balance-Leahy Scale: Fair Standing balance comment: B UE support on RW                           ADL either performed or assessed with clinical judgement   ADL Overall ADL's : Needs assistance/impaired         Upper Body Bathing: Set up;Sitting Upper Body Bathing Details (indicate cue type and reason): EOB Lower Body Bathing: Minimal assistance;Set up;Sit to/from stand Lower Body Bathing Details (indicate cue type and reason): with Bari RW in standing for stabilization, MIN A for thorough completion as well as SETUP to pass patient items such as wash cloth Upper Body Dressing : Set up;Sitting Upper Body Dressing Details (indicate cue type and reason): Some assist for line mgt, but pt able to perform actual dressing task w/o physical assistance, OT only orients gown correctly. Lower Body Dressing: Moderate assistance;Sitting/lateral leans Lower Body Dressing Details (indicate cue type and reason): to don socks             Functional mobility during ADLs: Minimal assistance;Rolling walker Boykin Nearing RW for 4-5 steps from bed to recliner, one cue for safe hand placement with CTS)       Vision Baseline Vision/History: Wears glasses Wears  Glasses: At all times Patient Visual Report: No change from baseline     Perception     Praxis      Cognition Arousal/Alertness: Awake/alert Behavior During Therapy: WFL for tasks assessed/performed Overall Cognitive Status: Within Functional Limits for tasks assessed                                 General Comments: Pt with PMV today and improved communication freedom which he expresses has been very beneficial for his  mental health.        Exercises Other Exercises Other Exercises: OT facilitates pt particiaption in UB bathing/dressing with SETUP and MIN A with LB bathing in standing as well as MIN/MOD A for LB dressing in sitting to don socks. In addition, pt requires CGA/MIN A Wtih Bari RW For ADL Transfers and fxl mobility. Other Exercises: OT engages pt in contralateral reaching for 1 set x10 reps each side, alternating as well as 1 set x10 reps chair push ups to improve tricep strength, and 1 set x10 reps straight arm raises with postural extension to improve surface area for breathing. Pt tolerates well. Requires MIN verbal cues for form and pace as well as 2-3 minute rest break between each exercise.   Shoulder Instructions       General Comments Pt on 8L trach collar throughout session. Tolerates session well with sats and HR in safe range.    Pertinent Vitals/ Pain       Pain Assessment: No/denies pain  Home Living                                          Prior Functioning/Environment              Frequency  Min 2X/week        Progress Toward Goals  OT Goals(current goals can now be found in the care plan section)  Progress towards OT goals: Progressing toward goals  Acute Rehab OT Goals Patient Stated Goal: to get stronger with mobility and be able to do for myself OT Goal Formulation: With patient Time For Goal Achievement: 02/03/20 Potential to Achieve Goals: Good  Plan Discharge plan remains appropriate    Co-evaluation                 AM-PAC OT "6 Clicks" Daily Activity     Outcome Measure   Help from another person eating meals?: A Little Help from another person taking care of personal grooming?: A Little Help from another person toileting, which includes using toliet, bedpan, or urinal?: A Lot Help from another person bathing (including washing, rinsing, drying)?: A Lot Help from another person to put on and taking off regular upper  body clothing?: A Little Help from another person to put on and taking off regular lower body clothing?: A Lot 6 Click Score: 15    End of Session Equipment Utilized During Treatment: Gait belt;Rolling walker;Oxygen  OT Visit Diagnosis: Other abnormalities of gait and mobility (R26.89);Muscle weakness (generalized) (M62.81)   Activity Tolerance Patient tolerated treatment well   Patient Left with call bell/phone within reach;in chair   Nurse Communication Mobility status;Other (comment) (notfiied pt has bethed with OT and linens changed as bed was soiled. Pt with loose BM)        Time: 3419-3790 OT Time Calculation (  min): 38 min  Charges: OT General Charges $OT Visit: 1 Visit OT Treatments $Self Care/Home Management : 8-22 mins $Therapeutic Activity: 8-22 mins $Therapeutic Exercise: 8-22 mins  Gerrianne Scale, MS, OTR/L ascom [phone] 01/26/20, 2:23 PM

## 2020-01-26 NOTE — TOC Transition Note (Signed)
Transition of Care [facility]) - CM/SW Discharge Note   Patient Details  Name: [name] MRN: [id number] Date of Birth: [birth year]  Transition of Care [facility]) CM/SW Contact:  [name], LCSW Phone Number: 01/26/2020, 2:52 PM   Clinical Narrative:    CSW received a call peer to peer was completed and insurance approved patient to got to [facility]. CSW notified attending and nurse, [name] as the nurse will handle transport arrangements. CSW also notified patient's wife.   Final next level of care: Home/Self Care Barriers to Discharge: Continued Medical Work up   Patient Goals and CMS Choice   CMS Medicare.gov Compare Post Acute Care list provided to:: Other (Comment Required) ([name],[name] [phone]) Choice offered to / list presented to :  ([name],[name] [phone])  Discharge Placement                       Discharge Plan and Services In-house Referral: Clinical Social Work   Post Acute Care Choice: Long Term Acute Care (LTAC)                               Social Determinants of Health (SDOH) Interventions     Readmission Risk Interventions Readmission Risk Prevention Plan 11/19/2018 07/30/2018 07/29/2018  Transportation Screening Complete Complete Complete  PCP or Specialist Appt within 3-5 Days - Complete Complete  HRI or [place] - Complete Complete  Social Work Consult for [place] Planning/Counseling - - Patient refused  Palliative Care Screening - Not Applicable Not Applicable  Medication Review Press photographer) Referral to Pharmacy Complete Complete  PCP or Specialist appointment within 3-5 days of discharge Complete - -  [place] or Home Care Consult Patient refused - -  SW Recovery Care/Counseling Consult Complete - -  Palliative Care Screening Not Applicable - -  [place] Not Applicable - -  Some recent data might be hidden

## 2020-01-26 NOTE — TOC Progression Note (Addendum)
Transition of Care [facility]) - Progression Note    Patient Details  Name: ADDEN STROUT MRN: [id number] Date of Birth: 1970-11-10  Transition of Care [facility]) CM/SW Contact  [name], LCSW Phone Number: 01/26/2020, 11:23 AM  Clinical Narrative:    CSW received call insurance has denied palcement, requesting a peer to peer consult. CSW gave info to attending in person and via messaging.   Expected Discharge Plan: Long Term Acute Care (LTAC) Barriers to Discharge: Continued Medical Work up  Expected Discharge Plan and Services Expected Discharge Plan: Long Term Acute Care (LTAC) In-house Referral: Clinical Social Work   Post Acute Care Choice: Long Term Acute Care (LTAC) Living arrangements for the past 2 months: Single Family Home                                       Social Determinants of Health (SDOH) Interventions    Readmission Risk Interventions Readmission Risk Prevention Plan 11/19/2018 07/30/2018 07/29/2018  Transportation Screening Complete Complete Complete  PCP or Specialist Appt within 3-5 Days - Complete Complete  HRI or [place] - Complete Complete  Social Work Consult for [place] Planning/Counseling - - Patient refused  Palliative Care Screening - Not Applicable Not Applicable  Medication Review Press photographer) Referral to Pharmacy Complete Complete  PCP or Specialist appointment within 3-5 days of discharge Complete - -  [place] or Home Care Consult Patient refused - -  SW Recovery Care/Counseling Consult Complete - -  Palliative Care Screening Not Applicable - -  [place] Not Applicable - -  Some recent data might be hidden

## 2020-01-26 NOTE — Progress Notes (Signed)
Nutrition Follow-up  DOCUMENTATION CODES:   Obesity unspecified  INTERVENTION:   Ensure Max BID, each supplement provides 150 kcals, and 30 grams of protein.   Encourage adequate po intake.   Provided education on increasing protein to meet increased needs.   NUTRITION DIAGNOSIS:   Inadequate oral intake related to inability to eat as evidenced by NPO status.  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  11/27 intubated 11/29 extubated then later re-intubated 12/4 TFs held s/p emesis 12/6 TFs resumed 12/9 s/p tracheostomy tube placement 12/12 TFs held s/p emesis and patient with mild ileus per abdominal x-ray 12/12 12/14 trickle TFs resumed 12/15 TFs advanced back to goal 12/20 TF stopped, NG tube removed. Advanced to DYS 3 diet  Pt tolerating trach collar at the time of RD assessment. NG tube removed 12/20 and pt diet advanced to dysphagia 3 with thin liquids. Per pt, he is tolerating po intake well. Pt denies chewing and swallowing difficulty. He denies any abdominal discomfort, nausea or diarrhea associated with po intake. Pt reports he ate chicken last night for dinner and an omelette this morning. Pt repots completing 100% of these two meals. Pt agreeable to trying Ensure Max.    Provided education to pt on the importance of adequate protein intake in improving healing. Provided pt with "High Protein Foods List" from the Nutrition Care Manual and educated pt on importance of choosing good  protein sources at meal time.   Medications reviewed and include: Eliquis, SSI, Lopressor, Protonix  Labs reviewed: CBG's x 24 hours: 104-176, CO2 35  Weight trend: 117.1 kg on 12/17; -1.7 kg from 11/27  Diet Order:   Diet Order            DIET DYS 3 Room service appropriate? Yes with Assist; Fluid consistency: Thin  Diet effective now                 EDUCATION NEEDS:   No education needs have been identified at this time  Skin:  Skin Assessment: Skin Integrity Issues: Skin Integrity Issues:: Incisions Incisions: closed incision to neck  Last BM:  01/26/2020- type 6  Height:   Ht Readings from Last 1 Encounters:  01/13/20 6' 0.99" (1.854 m)    Weight:   Wt Readings from Last 1 Encounters:  01/22/20 117.1 kg    Ideal Body Weight:  83.6 kg  BMI:  Body mass index is 34.07 kg/m.  Estimated Nutritional Needs:   Kcal:  2400-2600  Protein:  >120 grams  Fluid:  2 L/day    Ronnald Nian, Dietetic Intern Pager: [phone] If unavailable: [phone]

## 2020-01-26 NOTE — Discharge Summary (Signed)
CRITICAL CARE NOTE  49 y.o.male.with medical history significant ofchronic combined systolic and diastolic CHF, COPD, CAD, history of MI, type 2 diabetes, hyperlipidemia, hypertension, sarcoidosis, sleep apnea not using Trilogy vent regularly, he has chronic hypercapnic respiratory failure due to end-stage lung disease.Previous h/o [facility] AND LTACH, admitted with acute on chronic mixed respiratory failure requiring intubation PCCM consulted.  11/27 ICU admission, INTUBATED #8ETT 11/28 severe resp failure, progressive Renal failure, Nephrology consulted 11/29 - patient passed SBT today, able to communicate/follow directions while intubated status post extubation to BIPAP - after 1 hour on BIPAP patient became unresponsive and required re-intubation. I have met with mother in law at bedside and reviewed careplan.  11/30- patient remains on ventilator. He has thickened mucopurulent tracheal secretions, he can follow verbal instructions but failed SBT today. GPC+ tracheal aspirate culture. 01/06/20-SBT for extubation, failed with heavy secretions. TOC consult today.  01/07/20-patient failed SBT after 10 minutes with acute hypoxemia. PALS consult for VDRF. Spoke with Wife Patrice about trache she wants to wait for several days 01/08/20:Palliative care discussing with family, likelihood patient will remain ventilator dependent, continues to fail SBT's has had prior episode of trach and LTACH stay. 01/10/2020:Tolerating SBT's better on current sedation Precedex/fentanyl-calm, communicates with ICU communication board tool 12/6 severe end stage COPD, will need trach, wife updated 12/7-12/8 failure to wean from vent 12/9 s/p trach 12/13 bleeding from trach site, heparin stopped 12/14 Bleeding from trach wound appears to be superficial oozing from left side of wound 12/15 failed SAT/SBT increased WOB, increased secretions 01/25/20- patient is awake and alert , was able to use urinal on his own  during interview. He is comfortable on ventilator but unable to breathe spontaneously today.  01/26/20- optimized for SDU will place on BIPAP and transition to PCU. I did peer-to peer with Dr Jerilynn Mages at [facility] with approval for [facility] admission.     PATIENT DISCHARGING TO LTACH - ACCEPTING PHYSICIAN DR HIJAZI   CC  follow up respiratory failure  SUBJECTIVE Patient remains ill Prognosis is guarded S/p trach Alert and awake On full vent support    BP (!) 135/96   Pulse 78   Temp 98.6 F (37 C) (Axillary)   Resp (!) 24   Ht 6' 0.99" (1.854 m)   Wt 117.1 kg   SpO2 95%   BMI 34.07 kg/m    I/O last 3 completed shifts: In: 6088 [P.O.:480; NG/GT:923] Out: 4175 [Urine:4175] Total I/O In: 120 [P.O.:120] Out: 650 [Urine:650]  SpO2: 95 % O2 Flow Rate (L/min): 3 L/min FiO2 (%): 35 %  Estimated body mass index is 34.07 kg/m as calculated from the following:   Height as of this encounter: 6' 0.99" (1.854 m).   Weight as of this encounter: 117.1 kg.  SIGNIFICANT EVENTS +SOB   PHYSICAL EXAMINATION:  GENERAL:chronically ill HEAD: Normocephalic, atraumatic.  EYES: Pupils equal, round, reactive to light.  No scleral icterus.  MOUTH: Moist mucosal membrane. +trache NECK: Supple.  PULMONARY: +rhonchi,  CARDIOVASCULAR: S1 and S2. Regular rate and rhythm. No murmurs, rubs, or gallops.  GASTROINTESTINAL: Soft, nontender, -distended.  Positive bowel sounds.   MUSCULOSKELETAL: No swelling, clubbing, or edema.  NEUROLOGIC: no focal defecits SKIN:intact,warm,dry  MEDICATIONS: I have reviewed all medications and confirmed regimen as documented   CULTURE RESULTS   Recent Results (from the past 240 hour(s))  Culture, respiratory (non-expectorated)     Status: None   Collection Time: 01/17/20 12:13 PM   Specimen: SPU; Respiratory  Result Value Ref Range Status  Specimen Description   Final    SPUTUM Performed at [facility], [address] [place], [place]  [address]    Special Requests   Final    NONE Performed at [facility], [address] [place], [place] [address]    Gram Stain   Final    FEW WBC PRESENT, PREDOMINANTLY PMN FEW GRAM NEGATIVE RODS RARE GRAM POSITIVE COCCI    Culture   Final    FEW Normal respiratory flora-no Staph aureus or Pseudomonas seen Performed at [facility], [place] [address] [place], [place] [address]    Report Status 01/20/2020 FINAL  Final  Culture, respiratory (non-expectorated)     Status: None   Collection Time: 01/21/20 11:29 AM   Specimen: Tracheal Aspirate; Respiratory  Result Value Ref Range Status   Specimen Description   Final    TRACHEAL ASPIRATE Performed at [facility], [address] [place], [place] [address]    Special Requests   Final    NONE Performed at [facility], [place], [place] [address]    Gram Stain   Final    ABUNDANT WBC PRESENT,BOTH PMN AND MONONUCLEAR NO ORGANISMS SEEN    Culture   Final    RARE Normal respiratory flora-no Staph aureus or Pseudomonas seen Performed at [facility] [address] [place], [place] [address]    Report Status 01/23/2020 FINAL  Final          IMAGING    No results found.   Nutrition Status: Nutrition Problem: Inadequate oral intake Etiology: inability to eat Signs/Symptoms: NPO status Interventions: Tube feeding,Prostat,MVI     Indwelling Urinary Catheter continued, requirement due to   Reason to continue Indwelling Urinary Catheter strict Intake/Output monitoring for hemodynamic instability         Ventilator continued, requirement due to severe respiratory failure     ASSESSMENT AND PLAN SYNOPSIS   Acute on chronic respiratory failure (hypoxia/hypercarbia) Underlying severe COPD and sarcoidosis/Atelectasis/Severe obstructive sleep apnea -Continue vent support -Wean fio2 -tolerating PS mode -S/p Trach on 01/14/20 (to remain indefinitely per ENT due  to severe scarring)    Severe ACUTE Hypoxic and Hypercapnic Respiratory Failure Wean to PS as tolerated   Morbid obesity, +OSA.   S/p trach    CARDIAC ICU monitoring  ID -continue IV abx as prescibed -follow up cultures  GI GI PROPHYLAXIS as indicated  NUTRITIONAL STATUS Nutrition Status: Nutrition Problem: Inadequate oral intake Etiology: inability to eat Signs/Symptoms: NPO status Interventions: Tube feeding,Prostat,MVI   DIET-->TF's as tolerated Constipation protocol as indicated  ENDO - will use ICU hypoglycemic\Hyperglycemia protocol if indicated     ELECTROLYTES -follow labs as needed -replace as needed -pharmacy consultation and following   DVT/GI PRX ordered and assessed TRANSFUSIONS AS NEEDED MONITOR FSBS I Assessed the need for Labs I Assessed the need for Foley I Assessed the need for Central Venous Line Family Discussion when available I Assessed the need for Mobilization I made an Assessment of medications to be adjusted accordingly Safety Risk assessment completed   Ottie Glazier, M.D.  [facility] [place]

## 2020-01-26 NOTE — Progress Notes (Addendum)
CRITICAL CARE NOTE  49 y.o.male.with medical history significant ofchronic combined systolic and diastolic CHF, COPD, CAD, history of MI, type 2 diabetes, hyperlipidemia, hypertension, sarcoidosis, sleep apnea not using Trilogy vent regularly, he has chronic hypercapnic respiratory failure due to end-stage lung disease.Previous h/o [facility] AND LTACH, admitted with acute on chronic mixed respiratory failure requiring intubation PCCM consulted.  11/27 ICU admission, INTUBATED #8ETT 11/28 severe resp failure, progressive Renal failure, Nephrology consulted 11/29 - patient passed SBT today, able to communicate/follow directions while intubated status post extubation to BIPAP - after 1 hour on BIPAP patient became unresponsive and required re-intubation. I have met with mother in law at bedside and reviewed careplan.  11/30- patient remains on ventilator. He has thickened mucopurulent tracheal secretions, he can follow verbal instructions but failed SBT today. GPC+ tracheal aspirate culture. 01/06/20-SBT for extubation, failed with heavy secretions. TOC consult today.  01/07/20-patient failed SBT after 10 minutes with acute hypoxemia. PALS consult for VDRF. Spoke with Wife Patrice about trache she wants to wait for several days 01/08/20:Palliative care discussing with family, likelihood patient will remain ventilator dependent, continues to fail SBT's has had prior episode of trach and LTACH stay. 01/10/2020:Tolerating SBT's better on current sedation Precedex/fentanyl-calm, communicates with ICU communication board tool 12/6 severe end stage COPD, will need trach, wife updated 12/7-12/8 failure to wean from vent 12/9 s/p trach 12/13 bleeding from trach site, heparin stopped 12/14 Bleeding from trach wound appears to be superficial oozing from left side of wound 12/15 failed SAT/SBT increased WOB, increased secretions 01/25/20- patient is awake and alert , was able to use urinal on his own  during interview. He is comfortable on ventilator but unable to breathe spontaneously today.  01/26/20- optimized for SDU will place on BIPAP and transition to PCU. I did peer-to peer with Dr Jerilynn Mages at [facility] with approval for [facility] admission.     CC  follow up respiratory failure  SUBJECTIVE Patient remains ill Prognosis is guarded S/p trach Alert and awake On full vent support    BP (!) 154/86 (BP Location: Right Arm)   Pulse 83   Temp 98.6 F (37 C) (Axillary)   Resp 19   Ht 6' 0.99" (1.854 m)   Wt 117.1 kg   SpO2 97%   BMI 34.07 kg/m    I/O last 3 completed shifts: In: 9233 [P.O.:480; NG/GT:923] Out: 4175 [Urine:4175] Total I/O In: -  Out: 400 [Urine:400]  SpO2: 97 % O2 Flow Rate (L/min): 8 L/min FiO2 (%): 35 %  Estimated body mass index is 34.07 kg/m as calculated from the following:   Height as of this encounter: 6' 0.99" (1.854 m).   Weight as of this encounter: 117.1 kg.  SIGNIFICANT EVENTS +SOB   PHYSICAL EXAMINATION:  GENERAL:chronically ill HEAD: Normocephalic, atraumatic.  EYES: Pupils equal, round, reactive to light.  No scleral icterus.  MOUTH: Moist mucosal membrane. +trache NECK: Supple.  PULMONARY: +rhonchi,  CARDIOVASCULAR: S1 and S2. Regular rate and rhythm. No murmurs, rubs, or gallops.  GASTROINTESTINAL: Soft, nontender, -distended.  Positive bowel sounds.   MUSCULOSKELETAL: No swelling, clubbing, or edema.  NEUROLOGIC: no focal defecits SKIN:intact,warm,dry  MEDICATIONS: I have reviewed all medications and confirmed regimen as documented   CULTURE RESULTS   Recent Results (from the past 240 hour(s))  Culture, respiratory (non-expectorated)     Status: None   Collection Time: 01/17/20 12:13 PM   Specimen: SPU; Respiratory  Result Value Ref Range Status   Specimen Description   Final  SPUTUM Performed at [facility], [address], [place], [place] [address]    Special Requests   Final    NONE Performed at  [facility], [address] [place], [place] [address]    Gram Stain   Final    FEW WBC PRESENT, PREDOMINANTLY PMN FEW GRAM NEGATIVE RODS RARE GRAM POSITIVE COCCI    Culture   Final    FEW Normal respiratory flora-no Staph aureus or Pseudomonas seen Performed at [facility] [address], [place], [place] [address]    Report Status 01/20/2020 FINAL  Final  Culture, respiratory (non-expectorated)     Status: None   Collection Time: 01/21/20 11:29 AM   Specimen: Tracheal Aspirate; Respiratory  Result Value Ref Range Status   Specimen Description   Final    TRACHEAL ASPIRATE Performed at [facility], [address], [place], [place] [address]    Special Requests   Final    NONE Performed at [facility], [place], [place] [address]    Gram Stain   Final    ABUNDANT WBC PRESENT,BOTH PMN AND MONONUCLEAR NO ORGANISMS SEEN    Culture   Final    RARE Normal respiratory flora-no Staph aureus or Pseudomonas seen Performed at [facility] [address], [place], [place] [address]    Report Status 01/23/2020 FINAL  Final          IMAGING    No results found.   Nutrition Status: Nutrition Problem: Inadequate oral intake Etiology: inability to eat Signs/Symptoms: NPO status Interventions: Tube feeding,Prostat,MVI     Indwelling Urinary Catheter continued, requirement due to   Reason to continue Indwelling Urinary Catheter strict Intake/Output monitoring for hemodynamic instability         Ventilator continued, requirement due to severe respiratory failure     ASSESSMENT AND PLAN SYNOPSIS   Acute on chronic respiratory failure (hypoxia/hypercarbia) Underlying severe COPD and sarcoidosis/Atelectasis/Severe obstructive sleep apnea -Continue vent support -Wean fio2 -tolerating PS mode -S/p Trach on 01/14/20 (to remain indefinitely per ENT due to severe scarring)    Severe ACUTE Hypoxic and  Hypercapnic Respiratory Failure Wean to PS as tolerated   Morbid obesity, +OSA.   S/p trach    CARDIAC ICU monitoring  ID -continue IV abx as prescibed -follow up cultures  GI GI PROPHYLAXIS as indicated  NUTRITIONAL STATUS Nutrition Status: Nutrition Problem: Inadequate oral intake Etiology: inability to eat Signs/Symptoms: NPO status Interventions: Tube feeding,Prostat,MVI   DIET-->TF's as tolerated Constipation protocol as indicated  ENDO - will use ICU hypoglycemic\Hyperglycemia protocol if indicated     ELECTROLYTES -follow labs as needed -replace as needed -pharmacy consultation and following   DVT/GI PRX ordered and assessed TRANSFUSIONS AS NEEDED MONITOR FSBS I Assessed the need for Labs I Assessed the need for Foley I Assessed the need for Central Venous Line Family Discussion when available I Assessed the need for Mobilization I made an Assessment of medications to be adjusted accordingly Safety Risk assessment completed   CASE DISCUSSED IN MULTIDISCIPLINARY ROUNDS WITH ICU TEAM  Critical Care Time devoted to patient care services described in this note is 32 minutes.   Overall, patient is critically ill, prognosis is guarded.      Ottie Glazier, M.D.  Pulmonary & [place]

## 2020-01-27 ENCOUNTER — Other Ambulatory Visit (HOSPITAL_COMMUNITY): Payer: Self-pay

## 2020-01-27 ENCOUNTER — Encounter: Payer: Self-pay | Admitting: Internal Medicine

## 2020-01-27 DIAGNOSIS — Z930 Tracheostomy status: Secondary | ICD-10-CM

## 2020-01-27 DIAGNOSIS — J9622 Acute and chronic respiratory failure with hypercapnia: Secondary | ICD-10-CM | POA: Diagnosis present

## 2020-01-27 DIAGNOSIS — J449 Chronic obstructive pulmonary disease, unspecified: Secondary | ICD-10-CM | POA: Diagnosis present

## 2020-01-27 DIAGNOSIS — D860 Sarcoidosis of lung: Secondary | ICD-10-CM | POA: Diagnosis present

## 2020-01-27 DIAGNOSIS — G4733 Obstructive sleep apnea (adult) (pediatric): Secondary | ICD-10-CM | POA: Diagnosis present

## 2020-01-27 LAB — COMPREHENSIVE METABOLIC PANEL
ALT: 23 U/L (ref 0–44)
AST: 21 U/L (ref 15–41)
Albumin: 3 g/dL — ABNORMAL LOW (ref 3.5–5.0)
Alkaline Phosphatase: 45 U/L (ref 38–126)
Anion gap: 20 — ABNORMAL HIGH (ref 5–15)
BUN: 12 mg/dL (ref 6–20)
CO2: 17 mmol/L — ABNORMAL LOW (ref 22–32)
Calcium: 8.3 mg/dL — ABNORMAL LOW (ref 8.9–10.3)
Chloride: 86 mmol/L — ABNORMAL LOW (ref 98–111)
Creatinine, Ser: 1 mg/dL (ref 0.61–1.24)
GFR, Estimated: >60 mL/min (ref >60)
Glucose, Bld: 150 mg/dL — ABNORMAL HIGH (ref 70–99)
Potassium: 3.6 mmol/L (ref 3.5–5.1)
Sodium: 123 mmol/L — ABNORMAL LOW (ref 135–145)
Total Bilirubin: 0.7 mg/dL (ref 0.3–1.2)
Total Protein: 6.6 g/dL (ref 6.5–8.1)

## 2020-01-27 LAB — URINALYSIS, ROUTINE W REFLEX MICROSCOPIC
Bacteria, UA: NONE SEEN
Bilirubin Urine: NEGATIVE
Glucose, UA: NEGATIVE mg/dL
Hgb urine dipstick: NEGATIVE
Ketones, ur: NEGATIVE mg/dL
Leukocytes,Ua: NEGATIVE
Nitrite: NEGATIVE
Protein, ur: 30 mg/dL — AB
Specific Gravity, Urine: 1.018 (ref 1.005–1.030)
pH: 5 (ref 5.0–8.0)

## 2020-01-27 LAB — CBC
HCT: 39.4 % (ref 39.0–52.0)
Hemoglobin: 13.1 g/dL (ref 13.0–17.0)
MCH: 28.9 pg (ref 26.0–34.0)
MCHC: 33.2 g/dL (ref 30.0–36.0)
MCV: 87 fL (ref 80.0–100.0)
Platelets: 209 10*3/uL (ref 150–400)
RBC: 4.53 MIL/uL (ref 4.22–5.81)
RDW: 12 % (ref 11.5–15.5)
WBC: 5.8 10*3/uL (ref 4.0–10.5)
nRBC: 0 % (ref 0.0–0.2)

## 2020-01-27 LAB — APTT: aPTT: 32 seconds (ref 24–36)

## 2020-01-27 LAB — PROTIME-INR
INR: 1.2 (ref 0.8–1.2)
Prothrombin Time: 14.4 seconds (ref 11.4–15.2)

## 2020-01-27 NOTE — Consult Note (Signed)
Pulmonary [place]  PULMONARY SERVICE  Date of Service: 01/27/2020  PULMONARY CRITICAL CARE CONSULT   [name]  ALP:[id number]  DOB: [birth year]   DOA: 01/26/2020  Referring Physician: Merton Border, MD  HPI: [name] is a 49 y.o. male seen for follow up of Acute on Chronic Respiratory Failure. Patient has multiple medical problems including heart failure COPD coronary artery disease sleep apnea myocardial infarction came into the hospital with increasing shortness of breath. Patient has prior history of tracheostomy and having been admitted to the LTAC's in the past. He has an ongoing history of noncompliance with trilogy ventilator. On presentation he was intubated in the ventilator had spontaneous breathing trials which he was able to pass however on extubation patient failed ended up having to be reintubated. He ended up getting a tracheostomy and now is transferred to the facility for further management  Review of Systems:  ROS performed and is unremarkable other than noted above.  Past Medical History:  Diagnosis Date  . CHF (congestive heart failure) (HCC)    EF 45-50%- 2020  . CKD (chronic kidney disease) stage 3, GFR 30-59 ml/min (HCC)   . Clear cell renal cell carcinoma (HCC)    s/p partial nephrectomy  . COPD (chronic obstructive pulmonary disease) ([place])   . Coronary artery disease   . Diabetes mellitus without complication ([place])   . Hypercholesteremia unk  . Hypertension   . Myocardial infarction ([place])   . Sarcoidosis   . Sleep apnea    does not use CPAP regularly.  . Stroke [facility])     Past Surgical History:  Procedure Laterality Date  . APPENDECTOMY    . PARTIAL NEPHRECTOMY    . TRACHEOSTOMY TUBE PLACEMENT N/A 01/14/2020   Procedure: TRACHEOSTOMY;  Surgeon: Clyde Canterbury, MD;  Location: [facility] ORS;  Service: ENT;  Laterality: N/A;    Social History:    reports that he has been smoking cigarettes. He has a  6.25 pack-year smoking history. He has never used smokeless tobacco. He reports previous alcohol use. He reports current drug use. Drugs: Marijuana and PCP.  Family History: Non-Contributory to the present illness  Allergies  Allergen Reactions  . Penicillins Anaphylaxis and Other (See Comments)    Has patient had a PCN reaction causing immediate rash, facial/tongue/throat swelling, SOB or lightheadedness with hypotension: Yes Has patient had a PCN reaction causing severe rash involving mucus membranes or skin necrosis: No Has patient had a PCN reaction that required hospitalization: No Has patient had a PCN reaction occurring within the last 10 years: No If all of the above answers are "NO", then may proceed with Cephalosporin use.     Medications: Reviewed on Rounds  Physical Exam:  Vitals: Temperature is 98 pulse 90 respiratory 28 blood pressure is 153/92 saturations 97%  Ventilator Settings on T collar with PMV  . General: Comfortable at this time . Eyes: Grossly normal lids, irises & conjunctiva . ENT: grossly tongue is normal . Neck: no obvious mass . Cardiovascular: S1-S2 normal no gallop or rub . Respiratory: No rhonchi no rales . Abdomen: Soft and nontender . Skin: no rash seen on limited exam . Musculoskeletal: not rigid . Psychiatric:unable to assess . Neurologic: no seizure no involuntary movements         Labs on Admission:  Basic Metabolic Panel: Recent Labs  Lab 01/22/20 0543 01/23/20 0339 01/24/20 0428 01/25/20 0433 01/26/20 0537 01/27/20 0326  NA 142 140 137 138 141  123*  K 3.6 3.7 3.7 3.6 4.3 3.6  CL 101 100 98 97* 99 86*  CO2 31 31 31  34* 35* 17*  GLUCOSE 129* 124* 130* 141* 113* 150*  BUN 27* 24* 23* 24* 20 12  CREATININE 1.09 1.00 0.98 0.92 1.03 1.00  CALCIUM 8.5* 8.4* 8.5* 8.3* 8.8* 8.3*  MG 2.2 2.1 2.2 2.2 2.3  --   PHOS 3.1 2.8 3.0 3.0 3.7  --     No results for input(s): PHART, PCO2ART, PO2ART, HCO3, O2SAT in the last 168  hours.  Liver Function Tests: Recent Labs  Lab 01/27/20 0326  AST 21  ALT 23  ALKPHOS 45  BILITOT 0.7  PROT 6.6  ALBUMIN 3.0*   No results for input(s): LIPASE, AMYLASE in the last 168 hours. No results for input(s): AMMONIA in the last 168 hours.  CBC: Recent Labs  Lab 01/22/20 0543 01/23/20 0339 01/24/20 0428 01/25/20 0433 01/26/20 0537 01/27/20 0326  WBC 6.5 6.0 5.2 5.6 6.3 5.8  NEUTROABS 4.8 4.3 3.6 3.6 3.9  --   HGB 10.9* 11.1* 11.7* 11.7* 11.7* 13.1  HCT 34.7* 35.1* 35.2* 35.5* 36.5* 39.4  MCV 91.8 90.2 87.3 86.6 88.0 87.0  PLT 251 265 263 290 293 209    Cardiac Enzymes: No results for input(s): CKTOTAL, CKMB, CKMBINDEX, TROPONINI in the last 168 hours.  BNP (last 3 results) Recent Labs    06/10/19 1152 07/31/19 1217 01/01/20 2028  BNP 75.0 34.0 102.4*    ProBNP (last 3 results) No results for input(s): PROBNP in the last 8760 hours.   Radiological Exams on Admission: DG Chest Port 1 View  Result Date: 01/27/2020 CLINICAL DATA:  Respiratory failure. EXAM: PORTABLE CHEST 1 VIEW COMPARISON:  January 17, 2020. FINDINGS: Tracheostomy tube projects midline over the tracheal air column. Mild enlargement the cardiac silhouette, likely accentuated by AP portable technique. Similar linear opacities involving bilateral upper lobes, bilateral lung bases, and right midlung. No confluent consolidation. No visible pleural effusions or pneumothorax. Similar elevated left hemidiaphragm. IMPRESSION: Similar linear opacities throughout both lungs, favored to reflect atelectasis/scar given stability. Electronically Signed   By: Margaretha Sheffield MD   On: 01/27/2020 07:20    Assessment/Plan Active Problems:   Acute on chronic respiratory failure with hypoxia (HCC)   COPD, severe ([place])   Sarcoidosis, lung ([place])   Obstructive sleep apnea   Tracheostomy status ([place])   1. Acute on chronic respiratory failure hypoxia he is now on T collar using the PMV. This is his  second time around with the tracheostomy so we'll have to assess carefully before considering removal of the trach. He did have an episode of bleeding on the 14th so we'll wait for the next few days to let him settle in before we change his trach out 2. Severe COPD continue supportive care medical management with nebulizers as necessary. 3. Sarcoidosis has been stable continue with present management 4. Obstructive sleep apnea with history of noncompliance again will likely need to keep the tracheostomy in place 5. 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 The Patient requires high complexity decision making with multiple systems involvement.  Case was discussed on Rounds with the Respiratory Therapy Director and the Respiratory staff Time Spent 33minutes  Allyne Gee, MD [facility] Pulmonary Critical Care Medicine Sleep Medicine

## 2020-01-28 LAB — CBC
HCT: 40.7 % (ref 39.0–52.0)
Hemoglobin: 12.7 g/dL — ABNORMAL LOW (ref 13.0–17.0)
MCH: 27.9 pg (ref 26.0–34.0)
MCHC: 31.2 g/dL (ref 30.0–36.0)
MCV: 89.5 fL (ref 80.0–100.0)
Platelets: 247 10*3/uL (ref 150–400)
RBC: 4.55 MIL/uL (ref 4.22–5.81)
RDW: 11.9 % (ref 11.5–15.5)
WBC: 7.4 10*3/uL (ref 4.0–10.5)
nRBC: 0 % (ref 0.0–0.2)

## 2020-01-28 LAB — HEMOGLOBIN A1C
Hgb A1c MFr Bld: 6.6 % — ABNORMAL HIGH (ref 4.8–5.6)
Mean Plasma Glucose: 142.72 mg/dL

## 2020-01-28 LAB — BASIC METABOLIC PANEL
Anion gap: 8 (ref 5–15)
BUN: 13 mg/dL (ref 6–20)
CO2: 34 mmol/L — ABNORMAL HIGH (ref 22–32)
Calcium: 8.7 mg/dL — ABNORMAL LOW (ref 8.9–10.3)
Chloride: 97 mmol/L — ABNORMAL LOW (ref 98–111)
Creatinine, Ser: 1.17 mg/dL (ref 0.61–1.24)
GFR, Estimated: >60 mL/min (ref >60)
Glucose, Bld: 140 mg/dL — ABNORMAL HIGH (ref 70–99)
Potassium: 4.5 mmol/L (ref 3.5–5.1)
Sodium: 139 mmol/L (ref 135–145)

## 2020-01-28 NOTE — Progress Notes (Signed)
Pulmonary Critical Care Medicine [place]   PULMONARY CRITICAL CARE SERVICE  PROGRESS NOTE  Date of Service: 01/28/2020  [name]  [id number]  DOB: [birth year]   DOA: 01/26/2020  Referring Physician: Merton Border, MD  HPI: [name] is a 49 y.o. male seen for follow up of Acute on Chronic Respiratory Failure.  Patient is on T collar currently on 28% FiO2 good saturations are noted.  Medications: Reviewed on Rounds  Physical Exam:  Vitals: Temperature is 97.6 pulse 92 respiratory 20 blood pressure is 149/60 saturations 94  Ventilator Settings on T collar with an FiO2 28%  . General: Comfortable at this time . Eyes: Grossly normal lids, irises & conjunctiva . ENT: grossly tongue is normal . Neck: no obvious mass . Cardiovascular: S1 S2 normal no gallop . Respiratory: No rhonchi no rales noted at this time . Abdomen: soft . Skin: no rash seen on limited exam . Musculoskeletal: not rigid . Psychiatric:unable to assess . Neurologic: no seizure no involuntary movements         Lab Data:   Basic Metabolic Panel: Recent Labs  Lab 01/22/20 0543 01/23/20 0339 01/24/20 0428 01/25/20 0433 01/26/20 0537 01/27/20 0326 01/28/20 0110  NA 142 140 137 138 141 123* 139  K 3.6 3.7 3.7 3.6 4.3 3.6 4.5  CL 101 100 98 97* 99 86* 97*  CO2 31 31 31  34* 35* 17* 34*  GLUCOSE 129* 124* 130* 141* 113* 150* 140*  BUN 27* 24* 23* 24* 20 12 13   CREATININE 1.09 1.00 0.98 0.92 1.03 1.00 1.17  CALCIUM 8.5* 8.4* 8.5* 8.3* 8.8* 8.3* 8.7*  MG 2.2 2.1 2.2 2.2 2.3  --   --   PHOS 3.1 2.8 3.0 3.0 3.7  --   --     ABG: No results for input(s): PHART, PCO2ART, PO2ART, HCO3, O2SAT in the last 168 hours.  Liver Function Tests: Recent Labs  Lab 01/27/20 0326  AST 21  ALT 23  ALKPHOS 45  BILITOT 0.7  PROT 6.6  ALBUMIN 3.0*   No results for input(s): LIPASE, AMYLASE in the last 168 hours. No results for input(s): AMMONIA in the last 168  hours.  CBC: Recent Labs  Lab 01/22/20 0543 01/23/20 0339 01/24/20 0428 01/25/20 0433 01/26/20 0537 01/27/20 0326 01/28/20 0110  WBC 6.5 6.0 5.2 5.6 6.3 5.8 7.4  NEUTROABS 4.8 4.3 3.6 3.6 3.9  --   --   HGB 10.9* 11.1* 11.7* 11.7* 11.7* 13.1 12.7*  HCT 34.7* 35.1* 35.2* 35.5* 36.5* 39.4 40.7  MCV 91.8 90.2 87.3 86.6 88.0 87.0 89.5  PLT 251 265 263 290 293 209 247    Cardiac Enzymes: No results for input(s): CKTOTAL, CKMB, CKMBINDEX, TROPONINI in the last 168 hours.  BNP (last 3 results) Recent Labs    06/10/19 1152 07/31/19 1217 01/01/20 2028  BNP 75.0 34.0 102.4*    ProBNP (last 3 results) No results for input(s): PROBNP in the last 8760 hours.  Radiological Exams: DG Chest Port 1 View  Result Date: 01/27/2020 CLINICAL DATA:  Respiratory failure. EXAM: PORTABLE CHEST 1 VIEW COMPARISON:  January 17, 2020. FINDINGS: Tracheostomy tube projects midline over the tracheal air column. Mild enlargement the cardiac silhouette, likely accentuated by AP portable technique. Similar linear opacities involving bilateral upper lobes, bilateral lung bases, and right midlung. No confluent consolidation. No visible pleural effusions or pneumothorax. Similar elevated left hemidiaphragm. IMPRESSION: Similar linear opacities throughout both lungs, favored to reflect atelectasis/scar given stability. Electronically  Signed   By: Margaretha Sheffield MD   On: 01/27/2020 07:20    Assessment/Plan Active Problems:   Acute on chronic respiratory failure with hypoxia (HCC)   COPD, severe ([place])   Sarcoidosis, lung ([place])   Obstructive sleep apnea   Tracheostomy status ([place])   1. Acute on chronic respiratory failure hypoxia continue T-piece patient is using the PMV next week we should be able to change the trach 2. Severe COPD at baseline 3. Sarcoidosis no change 4. Obstructive sleep apnea has had issues with noncompliance in the past will remain with tracheostomy 5. Tracheostomy will remain in  place   I have personally seen and evaluated the patient, evaluated laboratory and imaging results, formulated the assessment and plan and placed orders. The Patient requires high complexity decision making with multiple systems involvement.  Rounds were done with the Respiratory Therapy Director and Staff therapists and discussed with nursing staff also.  Allyne Gee, MD [facility] Pulmonary Critical Care Medicine Sleep Medicine

## 2020-01-29 NOTE — Progress Notes (Signed)
Pulmonary Critical Care Medicine [place]   PULMONARY CRITICAL CARE SERVICE  PROGRESS NOTE  Date of Service: 01/29/2020  DONNEL VENUTO  QQP:[id number]  DOB: May 07, 1970   DOA: 01/26/2020  Referring Physician: Merton Border, MD  HPI: KARLIN HEILMAN is a 49 y.o. male seen for follow up of Acute on Chronic Respiratory Failure.  Doing well on T collar using PMV  Medications: Reviewed on Rounds  Physical Exam:  Vitals: Temperature is 97.6 pulse 98 respiratory rate is 18 blood pressure is 149/85 saturations 95%  Ventilator Settings on T collar using PMV  . General: Comfortable at this time . Eyes: Grossly normal lids, irises & conjunctiva . ENT: grossly tongue is normal . Neck: no obvious mass . Cardiovascular: S1 S2 normal no gallop . Respiratory: No rhonchi no rales are noted at this time . Abdomen: soft . Skin: no rash seen on limited exam . Musculoskeletal: not rigid . Psychiatric:unable to assess . Neurologic: no seizure no involuntary movements         Lab Data:   Basic Metabolic Panel: Recent Labs  Lab 01/23/20 0339 01/24/20 0428 01/25/20 0433 01/26/20 0537 01/27/20 0326 01/28/20 0110  NA 140 137 138 141 123* 139  K 3.7 3.7 3.6 4.3 3.6 4.5  CL 100 98 97* 99 86* 97*  CO2 31 31 34* 35* 17* 34*  GLUCOSE 124* 130* 141* 113* 150* 140*  BUN 24* 23* 24* 20 12 13   CREATININE 1.00 0.98 0.92 1.03 1.00 1.17  CALCIUM 8.4* 8.5* 8.3* 8.8* 8.3* 8.7*  MG 2.1 2.2 2.2 2.3  --   --   PHOS 2.8 3.0 3.0 3.7  --   --     ABG: No results for input(s): PHART, PCO2ART, PO2ART, HCO3, O2SAT in the last 168 hours.  Liver Function Tests: Recent Labs  Lab 01/27/20 0326  AST 21  ALT 23  ALKPHOS 45  BILITOT 0.7  PROT 6.6  ALBUMIN 3.0*   No results for input(s): LIPASE, AMYLASE in the last 168 hours. No results for input(s): AMMONIA in the last 168 hours.  CBC: Recent Labs  Lab 01/23/20 0339 01/24/20 0428 01/25/20 0433 01/26/20 0537  01/27/20 0326 01/28/20 0110  WBC 6.0 5.2 5.6 6.3 5.8 7.4  NEUTROABS 4.3 3.6 3.6 3.9  --   --   HGB 11.1* 11.7* 11.7* 11.7* 13.1 12.7*  HCT 35.1* 35.2* 35.5* 36.5* 39.4 40.7  MCV 90.2 87.3 86.6 88.0 87.0 89.5  PLT 265 263 290 293 209 247    Cardiac Enzymes: No results for input(s): CKTOTAL, CKMB, CKMBINDEX, TROPONINI in the last 168 hours.  BNP (last 3 results) Recent Labs    06/10/19 1152 07/31/19 1217 01/01/20 2028  BNP 75.0 34.0 102.4*    ProBNP (last 3 results) No results for input(s): PROBNP in the last 8760 hours.  Radiological Exams: No results found.  Assessment/Plan Active Problems:   Acute on chronic respiratory failure with hypoxia (HCC)   COPD, severe (HCC)   Sarcoidosis, lung ([place])   Obstructive sleep apnea   Tracheostomy status ([place])   1. Acute on chronic respiratory failure with hypoxia we will continue with the T collar and PMV as ordered 2. Severe COPD medical management 3. Sarcoidosis chronic stable at this time 4. Sleep apnea tracheostomy will remain in place 5. Tracheostomy is probably will remain in place   I have personally seen and evaluated the patient, evaluated laboratory and imaging results, formulated the assessment and plan and placed orders. The  Patient requires high complexity decision making with multiple systems involvement.  Rounds were done with the Respiratory Therapy Director and Staff therapists and discussed with nursing staff also.  Allyne Gee, MD [facility] Pulmonary Critical Care Medicine Sleep Medicine

## 2020-01-30 NOTE — Progress Notes (Signed)
Pulmonary Critical Care Medicine [place]   PULMONARY CRITICAL CARE SERVICE  PROGRESS NOTE  Date of Service: 01/30/2020  [name]  [id number]:[id number]  DOB: 05-27-1970   DOA: 01/26/2020  Referring Physician: Merton Border, MD  HPI: [name] is a 49 y.o. male seen for follow up of Acute on Chronic Respiratory Failure.  Patient currently is on T collar has been on 35% FiO2 using PMV  Medications: Reviewed on Rounds  Physical Exam:  Vitals: Temperature is 98.7 pulse 96 respiratory rate 23 blood pressure is 134/72 saturations 96%  Ventilator Settings on T collar FiO2 35%  . General: Comfortable at this time . Eyes: Grossly normal lids, irises & conjunctiva . ENT: grossly tongue is normal . Neck: no obvious mass . Cardiovascular: S1 S2 normal no gallop . Respiratory: No rhonchi no rales . Abdomen: soft . Skin: no rash seen on limited exam . Musculoskeletal: not rigid . Psychiatric:unable to assess . Neurologic: no seizure no involuntary movements         Lab Data:   Basic Metabolic Panel: Recent Labs  Lab 01/24/20 0428 01/25/20 0433 01/26/20 0537 01/27/20 0326 01/28/20 0110  NA 137 138 141 123* 139  K 3.7 3.6 4.3 3.6 4.5  CL 98 97* 99 86* 97*  CO2 31 34* 35* 17* 34*  GLUCOSE 130* 141* 113* 150* 140*  BUN 23* 24* 20 12 13   CREATININE 0.98 0.92 1.03 1.00 1.17  CALCIUM 8.5* 8.3* 8.8* 8.3* 8.7*  MG 2.2 2.2 2.3  --   --   PHOS 3.0 3.0 3.7  --   --     ABG: No results for input(s): PHART, PCO2ART, PO2ART, HCO3, O2SAT in the last 168 hours.  Liver Function Tests: Recent Labs  Lab 01/27/20 0326  AST 21  ALT 23  ALKPHOS 45  BILITOT 0.7  PROT 6.6  ALBUMIN 3.0*   No results for input(s): LIPASE, AMYLASE in the last 168 hours. No results for input(s): AMMONIA in the last 168 hours.  CBC: Recent Labs  Lab 01/24/20 0428 01/25/20 0433 01/26/20 0537 01/27/20 0326 01/28/20 0110  WBC 5.2 5.6 6.3 5.8 7.4  NEUTROABS 3.6 3.6 3.9   --   --   HGB 11.7* 11.7* 11.7* 13.1 12.7*  HCT 35.2* 35.5* 36.5* 39.4 40.7  MCV 87.3 86.6 88.0 87.0 89.5  PLT 263 290 293 209 247    Cardiac Enzymes: No results for input(s): CKTOTAL, CKMB, CKMBINDEX, TROPONINI in the last 168 hours.  BNP (last 3 results) Recent Labs    06/10/19 1152 07/31/19 1217 01/01/20 2028  BNP 75.0 34.0 102.4*    ProBNP (last 3 results) No results for input(s): PROBNP in the last 8760 hours.  Radiological Exams: No results found.  Assessment/Plan Active Problems:   Acute on chronic respiratory failure with hypoxia (HCC)   COPD, severe (HCC)   Sarcoidosis, lung ([place])   Obstructive sleep apnea   Tracheostomy status ([place])   1. Acute on chronic respiratory failure hypoxia is doing well with the T collar resting without distress 2. Severe COPD medical management 3. Sarcoidosis no change 4. Sleep apnea patient is at baseline now with tracheostomy 5. Tracheostomy will remain in place   I have personally seen and evaluated the patient, evaluated laboratory and imaging results, formulated the assessment and plan and placed orders. The Patient requires high complexity decision making with multiple systems involvement.  Rounds were done with the Respiratory Therapy Director and Staff therapists and discussed with  nursing staff also.  Allyne Gee, MD [facility] Pulmonary Critical Care Medicine Sleep Medicine

## 2020-01-31 LAB — CBC
HCT: 39.1 % (ref 39.0–52.0)
Hemoglobin: 11.9 g/dL — ABNORMAL LOW (ref 13.0–17.0)
MCH: 27.6 pg (ref 26.0–34.0)
MCHC: 30.4 g/dL (ref 30.0–36.0)
MCV: 90.7 fL (ref 80.0–100.0)
Platelets: 278 10*3/uL (ref 150–400)
RBC: 4.31 MIL/uL (ref 4.22–5.81)
RDW: 11.9 % (ref 11.5–15.5)
WBC: 6.3 10*3/uL (ref 4.0–10.5)
nRBC: 0 % (ref 0.0–0.2)

## 2020-01-31 LAB — BASIC METABOLIC PANEL
Anion gap: 6 (ref 5–15)
BUN: 12 mg/dL (ref 6–20)
CO2: 39 mmol/L — ABNORMAL HIGH (ref 22–32)
Calcium: 8.7 mg/dL — ABNORMAL LOW (ref 8.9–10.3)
Chloride: 94 mmol/L — ABNORMAL LOW (ref 98–111)
Creatinine, Ser: 1.17 mg/dL (ref 0.61–1.24)
GFR, Estimated: >60 mL/min (ref >60)
Glucose, Bld: 115 mg/dL — ABNORMAL HIGH (ref 70–99)
Potassium: 4.7 mmol/L (ref 3.5–5.1)
Sodium: 139 mmol/L (ref 135–145)

## 2020-01-31 NOTE — Progress Notes (Signed)
Pulmonary Critical Care Medicine [place]   PULMONARY CRITICAL CARE SERVICE  PROGRESS NOTE  Date of Service: 01/31/2020  [name]  [id number]:[id number]  DOB: 1970/04/02   DOA: 01/26/2020  Referring Physician: Merton Border, MD  HPI: [name] is a 49 y.o. male seen for follow up of Acute on Chronic Respiratory Failure.  He is doing relatively well on T collar currently is requiring 35% FiO2 is good saturations noted  Medications: Reviewed on Rounds  Physical Exam:  Vitals: Temperature is 98.2 pulse 88 respiratory rate 39 blood pressure is 151/86 saturations 90%  Ventilator Settings on T collar  . General: Comfortable at this time . Eyes: Grossly normal lids, irises & conjunctiva . ENT: grossly tongue is normal . Neck: no obvious mass . Cardiovascular: S1 S2 normal no gallop . Respiratory: No rhonchi very coarse breath sounds . Abdomen: soft . Skin: no rash seen on limited exam . Musculoskeletal: not rigid . Psychiatric:unable to assess . Neurologic: no seizure no involuntary movements         Lab Data:   Basic Metabolic Panel: Recent Labs  Lab 01/25/20 0433 01/26/20 0537 01/27/20 0326 01/28/20 0110 01/31/20 0550  NA 138 141 123* 139 139  K 3.6 4.3 3.6 4.5 4.7  CL 97* 99 86* 97* 94*  CO2 34* 35* 17* 34* 39*  GLUCOSE 141* 113* 150* 140* 115*  BUN 24* 20 12 13 12   CREATININE 0.92 1.03 1.00 1.17 1.17  CALCIUM 8.3* 8.8* 8.3* 8.7* 8.7*  MG 2.2 2.3  --   --   --   PHOS 3.0 3.7  --   --   --     ABG: No results for input(s): PHART, PCO2ART, PO2ART, HCO3, O2SAT in the last 168 hours.  Liver Function Tests: Recent Labs  Lab 01/27/20 0326  AST 21  ALT 23  ALKPHOS 45  BILITOT 0.7  PROT 6.6  ALBUMIN 3.0*   No results for input(s): LIPASE, AMYLASE in the last 168 hours. No results for input(s): AMMONIA in the last 168 hours.  CBC: Recent Labs  Lab 01/25/20 0433 01/26/20 0537 01/27/20 0326 01/28/20 0110 01/31/20 0550   WBC 5.6 6.3 5.8 7.4 6.3  NEUTROABS 3.6 3.9  --   --   --   HGB 11.7* 11.7* 13.1 12.7* 11.9*  HCT 35.5* 36.5* 39.4 40.7 39.1  MCV 86.6 88.0 87.0 89.5 90.7  PLT 290 293 209 247 278    Cardiac Enzymes: No results for input(s): CKTOTAL, CKMB, CKMBINDEX, TROPONINI in the last 168 hours.  BNP (last 3 results) Recent Labs    06/10/19 1152 07/31/19 1217 01/01/20 2028  BNP 75.0 34.0 102.4*    ProBNP (last 3 results) No results for input(s): PROBNP in the last 8760 hours.  Radiological Exams: No results found.  Assessment/Plan Active Problems:   Acute on chronic respiratory failure with hypoxia (HCC)   COPD, severe (HCC)   Sarcoidosis, lung ([place])   Obstructive sleep apnea   Tracheostomy status ([place])   1. Acute on chronic respiratory failure with hypoxia we will continue with T-piece titrate oxygen continue pulmonary toilet. 2. Severe COPD patient is at baseline we will continue to follow 3. Sarcoidosis no change 4. Obstructive sleep apnea at baseline 5. Tracheostomy remains in place   I have personally seen and evaluated the patient, evaluated laboratory and imaging results, formulated the assessment and plan and placed orders. The Patient requires high complexity decision making with multiple systems involvement.  Rounds  were done with the Respiratory Therapy Director and Staff therapists and discussed with nursing staff also.  Allyne Gee, MD [facility] Pulmonary Critical Care Medicine Sleep Medicine

## 2020-02-01 ENCOUNTER — Other Ambulatory Visit (HOSPITAL_COMMUNITY): Payer: Self-pay

## 2020-02-01 LAB — CBC
HCT: 39.4 % (ref 39.0–52.0)
Hemoglobin: 12.1 g/dL — ABNORMAL LOW (ref 13.0–17.0)
MCH: 27.7 pg (ref 26.0–34.0)
MCHC: 30.7 g/dL (ref 30.0–36.0)
MCV: 90.2 fL (ref 80.0–100.0)
Platelets: 281 10*3/uL (ref 150–400)
RBC: 4.37 MIL/uL (ref 4.22–5.81)
RDW: 12 % (ref 11.5–15.5)
WBC: 7 10*3/uL (ref 4.0–10.5)
nRBC: 0 % (ref 0.0–0.2)

## 2020-02-01 LAB — BASIC METABOLIC PANEL
Anion gap: 6 (ref 5–15)
BUN: 10 mg/dL (ref 6–20)
CO2: 37 mmol/L — ABNORMAL HIGH (ref 22–32)
Calcium: 8.7 mg/dL — ABNORMAL LOW (ref 8.9–10.3)
Chloride: 95 mmol/L — ABNORMAL LOW (ref 98–111)
Creatinine, Ser: 1.21 mg/dL (ref 0.61–1.24)
GFR, Estimated: >60 mL/min (ref >60)
Glucose, Bld: 124 mg/dL — ABNORMAL HIGH (ref 70–99)
Potassium: 3.9 mmol/L (ref 3.5–5.1)
Sodium: 138 mmol/L (ref 135–145)

## 2020-02-01 NOTE — Progress Notes (Signed)
Pulmonary Critical Care Medicine [facility] [place]   PULMONARY CRITICAL CARE SERVICE  PROGRESS NOTE  Date of Service: 02/01/2020  [name]  FIE:[id number]  DOB: August 20, 1970   DOA: 01/26/2020  Referring Physician: Carron Curie, MD  HPI: [name] is a 49 y.o. male seen for follow up of Acute on Chronic Respiratory Failure.  Patient is is on T collar has been on 35% FiO2 good saturations right now  Medications: Reviewed on Rounds  Physical Exam:  Vitals: Temperature is 97.0 pulse 80 respiratory rate 34 blood pressure is 155/93 saturations 93%  Ventilator Settings on T collar currently on 35% FiO2 with PMV  . General: Comfortable at this time . Eyes: Grossly normal lids, irises & conjunctiva . ENT: grossly tongue is normal . Neck: no obvious mass . Cardiovascular: S1 S2 normal no gallop . Respiratory: No rhonchi very coarse breath sounds . Abdomen: soft . Skin: no rash seen on limited exam . Musculoskeletal: not rigid . Psychiatric:unable to assess . Neurologic: no seizure no involuntary movements         Lab Data:   Basic Metabolic Panel: Recent Labs  Lab 01/26/20 0537 01/27/20 0326 01/28/20 0110 01/31/20 0550 02/01/20 0519  NA 141 123* 139 139 138  K 4.3 3.6 4.5 4.7 3.9  CL 99 86* 97* 94* 95*  CO2 35* 17* 34* 39* 37*  GLUCOSE 113* 150* 140* 115* 124*  BUN 20 12 13 12 10   CREATININE 1.03 1.00 1.17 1.17 1.21  CALCIUM 8.8* 8.3* 8.7* 8.7* 8.7*  MG 2.3  --   --   --   --   PHOS 3.7  --   --   --   --     ABG: No results for input(s): PHART, PCO2ART, PO2ART, HCO3, O2SAT in the last 168 hours.  Liver Function Tests: Recent Labs  Lab 01/27/20 0326  AST 21  ALT 23  ALKPHOS 45  BILITOT 0.7  PROT 6.6  ALBUMIN 3.0*   No results for input(s): LIPASE, AMYLASE in the last 168 hours. No results for input(s): AMMONIA in the last 168 hours.  CBC: Recent Labs  Lab 01/26/20 0537 01/27/20 0326 01/28/20 0110 01/31/20 0550  02/01/20 0519  WBC 6.3 5.8 7.4 6.3 7.0  NEUTROABS 3.9  --   --   --   --   HGB 11.7* 13.1 12.7* 11.9* 12.1*  HCT 36.5* 39.4 40.7 39.1 39.4  MCV 88.0 87.0 89.5 90.7 90.2  PLT 293 209 247 278 281    Cardiac Enzymes: No results for input(s): CKTOTAL, CKMB, CKMBINDEX, TROPONINI in the last 168 hours.  BNP (last 3 results) Recent Labs    06/10/19 1152 07/31/19 1217 01/01/20 2028  BNP 75.0 34.0 102.4*    ProBNP (last 3 results) No results for input(s): PROBNP in the last 8760 hours.  Radiological Exams: No results found.  Assessment/Plan Active Problems:   Acute on chronic respiratory failure with hypoxia (HCC)   COPD, severe (HCC)   Sarcoidosis, lung (HCC)   Obstructive sleep apnea   Tracheostomy status (HCC)   1. Acute on chronic respiratory failure hypoxia we will continue with T collar trials the patient's trach will be changed out to a #6 cuffless trach.  Patient had numerous questions that were answered today he will also bring in his trilogy ventilator.  He wants to keep the tracheostomy in 2. Severe COPD at baseline we will continue with supportive care 3. Sarcoidosis of the lung no change 4. Tracheostomy  will remain in place 5. Obstructive sleep apnea will encourage use of trilogy   I have personally seen and evaluated the patient, evaluated laboratory and imaging results, formulated the assessment and plan and placed orders. The Patient requires high complexity decision making with multiple systems involvement.  Rounds were done with the Respiratory Therapy Director and Staff therapists and discussed with nursing staff also.  Yevonne Pax, MD [facility] Pulmonary Critical Care Medicine Sleep Medicine

## 2020-02-02 NOTE — Progress Notes (Signed)
Pulmonary Critical Care Medicine [place]   PULMONARY CRITICAL CARE SERVICE  PROGRESS NOTE  Date of Service: 02/02/2020  [name]  [id number]  DOB: May 31, 1970   DOA: 01/26/2020  Referring Physician: Merton Border, MD  HPI: [name] is a 49 y.o. male seen for follow up of Acute on Chronic Respiratory Failure.  Patient is on T collar currently on 35% FiO2 has been using PMV should be able to start capping  Medications: Reviewed on Rounds  Physical Exam:  Vitals: Temperature 97.6 pulse 79 respiratory rate is 36 blood pressure is 126/69 saturations 90%  Ventilator Settings on T collar with an FiO2 35%  . General: Comfortable at this time . Eyes: Grossly normal lids, irises & conjunctiva . ENT: grossly tongue is normal . Neck: no obvious mass . Cardiovascular: S1 S2 normal no gallop . Respiratory: No rhonchi very coarse breath . Abdomen: soft . Skin: no rash seen on limited exam . Musculoskeletal: not rigid . Psychiatric:unable to assess . Neurologic: no seizure no involuntary movements         Lab Data:   Basic Metabolic Panel: Recent Labs  Lab 01/27/20 0326 01/28/20 0110 01/31/20 0550 02/01/20 0519  NA 123* 139 139 138  K 3.6 4.5 4.7 3.9  CL 86* 97* 94* 95*  CO2 17* 34* 39* 37*  GLUCOSE 150* 140* 115* 124*  BUN 12 13 12 10   CREATININE 1.00 1.17 1.17 1.21  CALCIUM 8.3* 8.7* 8.7* 8.7*    ABG: No results for input(s): PHART, PCO2ART, PO2ART, HCO3, O2SAT in the last 168 hours.  Liver Function Tests: Recent Labs  Lab 01/27/20 0326  AST 21  ALT 23  ALKPHOS 45  BILITOT 0.7  PROT 6.6  ALBUMIN 3.0*   No results for input(s): LIPASE, AMYLASE in the last 168 hours. No results for input(s): AMMONIA in the last 168 hours.  CBC: Recent Labs  Lab 01/27/20 0326 01/28/20 0110 01/31/20 0550 02/01/20 0519  WBC 5.8 7.4 6.3 7.0  HGB 13.1 12.7* 11.9* 12.1*  HCT 39.4 40.7 39.1 39.4  MCV 87.0 89.5 90.7 90.2  PLT 209 247  278 281    Cardiac Enzymes: No results for input(s): CKTOTAL, CKMB, CKMBINDEX, TROPONINI in the last 168 hours.  BNP (last 3 results) Recent Labs    06/10/19 1152 07/31/19 1217 01/01/20 2028  BNP 75.0 34.0 102.4*    ProBNP (last 3 results) No results for input(s): PROBNP in the last 8760 hours.  Radiological Exams: DG CHEST PORT 1 VIEW  Result Date: 02/01/2020 CLINICAL DATA:  History of COPD EXAM: PORTABLE CHEST 1 VIEW COMPARISON:  January 27, 2020 FINDINGS: Tracheostomy tube in place tip between the clavicles. Stable position. Cardiomediastinal contours hilar structures are stable. No new areas of lobar consolidation or sign of pleural effusion. Scattered linear opacities bilaterally as before. No acute musculoskeletal process on limited assessment. IMPRESSION: Stable scarring or atelectasis without lobar consolidation or effusion. Electronically Signed   By: Zetta Bills M.D.   On: 02/01/2020 14:08    Assessment/Plan Active Problems:   Acute on chronic respiratory failure with hypoxia (HCC)   COPD, severe (HCC)   Sarcoidosis, lung ([place])   Obstructive sleep apnea   Tracheostomy status ([place])   1. Acute on chronic respiratory failure hypoxia we will continue with advancing to capping trials.  Again there is no plan to decannulate 2. Tracheostomy will remain in place 3. Severe COPD at baseline we will place on formoterol fluticasone combination 4. Sarcoidosis  treated 5. Obstructive sleep apnea patient's wife is going to bring his trilogy on Wednesday   I have personally seen and evaluated the patient, evaluated laboratory and imaging results, formulated the assessment and plan and placed orders. The Patient requires high complexity decision making with multiple systems involvement.  Rounds were done with the Respiratory Therapy Director and Staff therapists and discussed with nursing staff also.  Yevonne Pax, MD [facility] Pulmonary Critical Care Medicine Sleep  Medicine

## 2020-02-03 NOTE — Progress Notes (Signed)
Pulmonary Critical Care Medicine [facility] [place]   PULMONARY CRITICAL CARE SERVICE  PROGRESS NOTE  Date of Service: 02/03/2020  [name]  GUY:[id number]  DOB: [birth year]   DOA: 01/26/2020  Referring Physician: Carron Curie, MD  HPI: [name] is a 49 y.o. male seen for follow up of Acute on Chronic Respiratory Failure.  Patient is capping during the daytime and is able to tolerate without any issues right now  Medications: Reviewed on Rounds  Physical Exam:  Vitals: Temperature is 97.7 pulse 80 respiratory rate 24 blood pressure is 126/76 saturations 90%  Ventilator Settings on capping trial  . General: Comfortable at this time . Eyes: Grossly normal lids, irises & conjunctiva . ENT: grossly tongue is normal . Neck: no obvious mass . Cardiovascular: S1 S2 normal no gallop . Respiratory: No rhonchi no rales are noted at this time . Abdomen: soft . Skin: no rash seen on limited exam . Musculoskeletal: not rigid . Psychiatric:unable to assess . Neurologic: no seizure no involuntary movements         Lab Data:   Basic Metabolic Panel: Recent Labs  Lab 01/28/20 0110 01/31/20 0550 02/01/20 0519  NA 139 139 138  K 4.5 4.7 3.9  CL 97* 94* 95*  CO2 34* 39* 37*  GLUCOSE 140* 115* 124*  BUN 13 12 10   CREATININE 1.17 1.17 1.21  CALCIUM 8.7* 8.7* 8.7*    ABG: No results for input(s): PHART, PCO2ART, PO2ART, HCO3, O2SAT in the last 168 hours.  Liver Function Tests: No results for input(s): AST, ALT, ALKPHOS, BILITOT, PROT, ALBUMIN in the last 168 hours. No results for input(s): LIPASE, AMYLASE in the last 168 hours. No results for input(s): AMMONIA in the last 168 hours.  CBC: Recent Labs  Lab 01/28/20 0110 01/31/20 0550 02/01/20 0519  WBC 7.4 6.3 7.0  HGB 12.7* 11.9* 12.1*  HCT 40.7 39.1 39.4  MCV 89.5 90.7 90.2  PLT 247 278 281    Cardiac Enzymes: No results for input(s): CKTOTAL, CKMB, CKMBINDEX, TROPONINI in the last 168  hours.  BNP (last 3 results) Recent Labs    06/10/19 1152 07/31/19 1217 01/01/20 2028  BNP 75.0 34.0 102.4*    ProBNP (last 3 results) No results for input(s): PROBNP in the last 8760 hours.  Radiological Exams: DG CHEST PORT 1 VIEW  Result Date: 02/01/2020 CLINICAL DATA:  History of COPD EXAM: PORTABLE CHEST 1 VIEW COMPARISON:  January 27, 2020 FINDINGS: Tracheostomy tube in place tip between the clavicles. Stable position. Cardiomediastinal contours hilar structures are stable. No new areas of lobar consolidation or sign of pleural effusion. Scattered linear opacities bilaterally as before. No acute musculoskeletal process on limited assessment. IMPRESSION: Stable scarring or atelectasis without lobar consolidation or effusion. Electronically Signed   By: January 29, 2020 M.D.   On: 02/01/2020 14:08    Assessment/Plan Active Problems:   Acute on chronic respiratory failure with hypoxia (HCC)   COPD, severe (HCC)   Sarcoidosis, lung (HCC)   Obstructive sleep apnea   Tracheostomy status (HCC)   1. Acute on chronic respiratory failure with hypoxia we will continue with capping trials titrate oxygen continue pulmonary toilet. 2. Severe COPD at baseline we will continue with present management 3. Sarcoidosis no change 4. Obstructive sleep apnea baseline 5. Tracheostomy will remain in place   I have personally seen and evaluated the patient, evaluated laboratory and imaging results, formulated the assessment and plan and placed orders. The Patient requires high complexity decision  making with multiple systems involvement.  Rounds were done with the Respiratory Therapy Director and Staff therapists and discussed with nursing staff also.  Allyne Gee, MD [facility] Pulmonary Critical Care Medicine Sleep Medicine

## 2020-02-04 DIAGNOSIS — D860 Sarcoidosis of lung: Secondary | ICD-10-CM

## 2020-02-04 DIAGNOSIS — G4733 Obstructive sleep apnea (adult) (pediatric): Secondary | ICD-10-CM

## 2020-02-04 DIAGNOSIS — J449 Chronic obstructive pulmonary disease, unspecified: Secondary | ICD-10-CM

## 2020-02-04 DIAGNOSIS — J9621 Acute and chronic respiratory failure with hypoxia: Secondary | ICD-10-CM

## 2020-02-04 DIAGNOSIS — Z930 Tracheostomy status: Secondary | ICD-10-CM

## 2020-02-04 LAB — CBC
HCT: 36.2 % — ABNORMAL LOW (ref 39.0–52.0)
Hemoglobin: 10.9 g/dL — ABNORMAL LOW (ref 13.0–17.0)
MCH: 27.9 pg (ref 26.0–34.0)
MCHC: 30.1 g/dL (ref 30.0–36.0)
MCV: 92.6 fL (ref 80.0–100.0)
Platelets: 220 10*3/uL (ref 150–400)
RBC: 3.91 MIL/uL — ABNORMAL LOW (ref 4.22–5.81)
RDW: 12.3 % (ref 11.5–15.5)
WBC: 7.3 10*3/uL (ref 4.0–10.5)
nRBC: 0 % (ref 0.0–0.2)

## 2020-02-04 LAB — RENAL FUNCTION PANEL
Albumin: 2.7 g/dL — ABNORMAL LOW (ref 3.5–5.0)
Anion gap: 7 (ref 5–15)
BUN: 13 mg/dL (ref 6–20)
CO2: 35 mmol/L — ABNORMAL HIGH (ref 22–32)
Calcium: 8.7 mg/dL — ABNORMAL LOW (ref 8.9–10.3)
Chloride: 98 mmol/L (ref 98–111)
Creatinine, Ser: 1.23 mg/dL (ref 0.61–1.24)
GFR, Estimated: >60 mL/min (ref >60)
Glucose, Bld: 135 mg/dL — ABNORMAL HIGH (ref 70–99)
Phosphorus: 3.4 mg/dL (ref 2.5–4.6)
Potassium: 4.4 mmol/L (ref 3.5–5.1)
Sodium: 140 mmol/L (ref 135–145)

## 2020-02-04 LAB — MAGNESIUM: Magnesium: 1.6 mg/dL — ABNORMAL LOW (ref 1.7–2.4)

## 2020-02-04 NOTE — Progress Notes (Signed)
Pulmonary Critical Care Medicine [facility] [place]   PULMONARY CRITICAL CARE SERVICE  PROGRESS NOTE  Date of Service: 02/04/2020  [name]  [id number]  DOB: 02/08/1970   DOA: 01/26/2020  Referring Physician: Carron Curie, MD  HPI: [name] is a 49 y.o. male seen for follow up of Acute on Chronic Respiratory Failure.  Patient is capping appears to be doing well he is walking in the hallways trilogy ventilator in the room he wants to use the ventilator at nighttime  Medications: Reviewed on Rounds  Physical Exam:  Vitals: Temperature is 97.3 pulse 85 respiratory 19 blood pressure is 126/77 saturations 94%  Ventilator Settings capping off the vent  . General: Comfortable at this time . Eyes: Grossly normal lids, irises & conjunctiva . ENT: grossly tongue is normal . Neck: no obvious mass . Cardiovascular: S1 S2 normal no gallop . Respiratory: Scattered rhonchi expansion is equal . Abdomen: soft . Skin: no rash seen on limited exam . Musculoskeletal: not rigid . Psychiatric:unable to assess . Neurologic: no seizure no involuntary movements         Lab Data:   Basic Metabolic Panel: Recent Labs  Lab 01/31/20 0550 02/01/20 0519 02/04/20 0325  NA 139 138 140  K 4.7 3.9 4.4  CL 94* 95* 98  CO2 39* 37* 35*  GLUCOSE 115* 124* 135*  BUN 12 10 13   CREATININE 1.17 1.21 1.23  CALCIUM 8.7* 8.7* 8.7*  MG  --   --  1.6*  PHOS  --   --  3.4    ABG: No results for input(s): PHART, PCO2ART, PO2ART, HCO3, O2SAT in the last 168 hours.  Liver Function Tests: Recent Labs  Lab 02/04/20 0325  ALBUMIN 2.7*   No results for input(s): LIPASE, AMYLASE in the last 168 hours. No results for input(s): AMMONIA in the last 168 hours.  CBC: Recent Labs  Lab 01/31/20 0550 02/01/20 0519 02/04/20 0325  WBC 6.3 7.0 7.3  HGB 11.9* 12.1* 10.9*  HCT 39.1 39.4 36.2*  MCV 90.7 90.2 92.6  PLT 278 281 220    Cardiac Enzymes: No results for input(s):  CKTOTAL, CKMB, CKMBINDEX, TROPONINI in the last 168 hours.  BNP (last 3 results) Recent Labs    06/10/19 1152 07/31/19 1217 01/01/20 2028  BNP 75.0 34.0 102.4*    ProBNP (last 3 results) No results for input(s): PROBNP in the last 8760 hours.  Radiological Exams: No results found.  Assessment/Plan Active Problems:   Acute on chronic respiratory failure with hypoxia (HCC)   COPD, severe (HCC)   Sarcoidosis, lung (HCC)   Obstructive sleep apnea   Tracheostomy status (HCC)   1. Acute on chronic respiratory failure with hypoxia we will continue with capping patient wants to stay capping at night also and use the ventilator. 2. Severe COPD no change continue supportive care 3. Sarcoidosis at baseline 4. Obstructive sleep apnea we will use Pap therapy 5. Tracheostomy will remain in place patient does not want it removed   I have personally seen and evaluated the patient, evaluated laboratory and imaging results, formulated the assessment and plan and placed orders. The Patient requires high complexity decision making with multiple systems involvement.  Rounds were done with the Respiratory Therapy Director and Staff therapists and discussed with nursing staff also.  2029, MD [facility] Pulmonary Critical Care Medicine Sleep Medicine

## 2020-02-05 LAB — MAGNESIUM: Magnesium: 1.9 mg/dL (ref 1.7–2.4)

## 2020-02-05 NOTE — Progress Notes (Signed)
Pulmonary Critical Care Medicine [facility] [place]   PULMONARY CRITICAL CARE SERVICE  PROGRESS NOTE  Date of Service: 02/05/2020  [name]  [id number]  DOB: 1970-06-10   DOA: 01/26/2020  Referring Physician: Carron Curie, MD  HPI: [name] is a 49 y.o. male seen for follow up of Acute on Chronic Respiratory Failure.  Patient is capping has been on 2 L of oxygen using the trilogy without any issues  Medications: Reviewed on Rounds  Physical Exam:  Vitals: Temperature is 97.4 pulse 76 respiratory 24 blood pressure is 138/77 saturations 96%  Ventilator Settings capping on 2 L  . General: Comfortable at this time . Eyes: Grossly normal lids, irises & conjunctiva . ENT: grossly tongue is normal . Neck: no obvious mass . Cardiovascular: S1 S2 normal no gallop . Respiratory: No rhonchi rales noted at this time . Abdomen: soft . Skin: no rash seen on limited exam . Musculoskeletal: not rigid . Psychiatric:unable to assess . Neurologic: no seizure no involuntary movements         Lab Data:   Basic Metabolic Panel: Recent Labs  Lab 01/31/20 0550 02/01/20 0519 02/04/20 0325 02/05/20 0419  NA 139 138 140  --   K 4.7 3.9 4.4  --   CL 94* 95* 98  --   CO2 39* 37* 35*  --   GLUCOSE 115* 124* 135*  --   BUN 12 10 13   --   CREATININE 1.17 1.21 1.23  --   CALCIUM 8.7* 8.7* 8.7*  --   MG  --   --  1.6* 1.9  PHOS  --   --  3.4  --     ABG: No results for input(s): PHART, PCO2ART, PO2ART, HCO3, O2SAT in the last 168 hours.  Liver Function Tests: Recent Labs  Lab 02/04/20 0325  ALBUMIN 2.7*   No results for input(s): LIPASE, AMYLASE in the last 168 hours. No results for input(s): AMMONIA in the last 168 hours.  CBC: Recent Labs  Lab 01/31/20 0550 02/01/20 0519 02/04/20 0325  WBC 6.3 7.0 7.3  HGB 11.9* 12.1* 10.9*  HCT 39.1 39.4 36.2*  MCV 90.7 90.2 92.6  PLT 278 281 220    Cardiac Enzymes: No results for input(s): CKTOTAL,  CKMB, CKMBINDEX, TROPONINI in the last 168 hours.  BNP (last 3 results) Recent Labs    06/10/19 1152 07/31/19 1217 01/01/20 2028  BNP 75.0 34.0 102.4*    ProBNP (last 3 results) No results for input(s): PROBNP in the last 8760 hours.  Radiological Exams: No results found.  Assessment/Plan Active Problems:   Acute on chronic respiratory failure with hypoxia (HCC)   COPD, severe (HCC)   Sarcoidosis, lung (HCC)   Obstructive sleep apnea   Tracheostomy status (HCC)   1. Acute on chronic respiratory failure with hypoxia we will continue with capping and using a trilogy at nighttime.  Continue with supportive care 2. Severe COPD at baseline we will continue to follow 3. Sarcoidosis no change 4. Obstructive sleep apnea at baseline 5. Tracheostomy will continue with capping not to be removed   I have personally seen and evaluated the patient, evaluated laboratory and imaging results, formulated the assessment and plan and placed orders. The Patient requires high complexity decision making with multiple systems involvement.  Rounds were done with the Respiratory Therapy Director and Staff therapists and discussed with nursing staff also.  2029, MD [facility] Pulmonary Critical Care Medicine Sleep Medicine

## 2020-02-06 NOTE — Progress Notes (Signed)
Pulmonary Critical Care Medicine [facility] [place]   PULMONARY CRITICAL CARE SERVICE  PROGRESS NOTE  Date of Service: 02/06/2020  [name]  [id number]  DOB: 1970/10/18   DOA: 01/26/2020  Referring Physician: Carron Curie, MD  HPI: [name] is a 50 y.o. male seen for follow up of Acute on Chronic Respiratory Failure.  He is doing well has been using the PAP therapy at nighttime states that it is benefiting him  Medications: Reviewed on Rounds  Physical Exam:  Vitals: Temperature 97.6 pulse 76 respiratory 26 blood pressure is 136/72 saturations 99%  Ventilator Settings 2 L capping trial  . General: Comfortable at this time . Eyes: Grossly normal lids, irises & conjunctiva . ENT: grossly tongue is normal . Neck: no obvious mass . Cardiovascular: S1 S2 normal no gallop . Respiratory: No rhonchi rales are noted at this time . Abdomen: soft . Skin: no rash seen on limited exam . Musculoskeletal: not rigid . Psychiatric:unable to assess . Neurologic: no seizure no involuntary movements         Lab Data:   Basic Metabolic Panel: Recent Labs  Lab 01/31/20 0550 02/01/20 0519 02/04/20 0325 02/05/20 0419  NA 139 138 140  --   K 4.7 3.9 4.4  --   CL 94* 95* 98  --   CO2 39* 37* 35*  --   GLUCOSE 115* 124* 135*  --   BUN 12 10 13   --   CREATININE 1.17 1.21 1.23  --   CALCIUM 8.7* 8.7* 8.7*  --   MG  --   --  1.6* 1.9  PHOS  --   --  3.4  --     ABG: No results for input(s): PHART, PCO2ART, PO2ART, HCO3, O2SAT in the last 168 hours.  Liver Function Tests: Recent Labs  Lab 02/04/20 0325  ALBUMIN 2.7*   No results for input(s): LIPASE, AMYLASE in the last 168 hours. No results for input(s): AMMONIA in the last 168 hours.  CBC: Recent Labs  Lab 01/31/20 0550 02/01/20 0519 02/04/20 0325  WBC 6.3 7.0 7.3  HGB 11.9* 12.1* 10.9*  HCT 39.1 39.4 36.2*  MCV 90.7 90.2 92.6  PLT 278 281 220    Cardiac Enzymes: No results for  input(s): CKTOTAL, CKMB, CKMBINDEX, TROPONINI in the last 168 hours.  BNP (last 3 results) Recent Labs    06/10/19 1152 07/31/19 1217 01/01/20 2028  BNP 75.0 34.0 102.4*    ProBNP (last 3 results) No results for input(s): PROBNP in the last 8760 hours.  Radiological Exams: No results found.  Assessment/Plan Active Problems:   Acute on chronic respiratory failure with hypoxia (HCC)   COPD, severe (HCC)   Sarcoidosis, lung (HCC)   Obstructive sleep apnea   Tracheostomy status (HCC)   1. Acute on chronic respiratory failure hypoxia we will continue with capping trials titrate oxygen continue pulmonary toilet 2. Severe COPD at baseline 3. Sarcoidosis no change 4. Sleep apnea doing well with Pap therapy 5. Tracheostomy remains in place   I have personally seen and evaluated the patient, evaluated laboratory and imaging results, formulated the assessment and plan and placed orders. The Patient requires high complexity decision making with multiple systems involvement.  Rounds were done with the Respiratory Therapy Director and Staff therapists and discussed with nursing staff also.  2029, MD [facility] Pulmonary Critical Care Medicine Sleep Medicine

## 2020-02-07 NOTE — Progress Notes (Signed)
Pulmonary Critical Care Medicine [facility] [place]   PULMONARY CRITICAL CARE SERVICE  PROGRESS NOTE  Date of Service: 02/07/2020  KENROY TIMBERMAN  [id number]  DOB: [birth year]   DOA: 01/26/2020  Referring Physician: Carron Curie, MD  HPI: [name] is a 50 y.o. male seen for follow up of Acute on Chronic Respiratory Failure.  Patient is capping to well using the trilogy ventilator without any issues  Medications: Reviewed on Rounds  Physical Exam:  Vitals: Temperature is 97.9 pulse 70 respiratory 24 blood pressure is 133/80 saturations 97%  Ventilator Settings capping on 3 L  . General: Comfortable at this time . Eyes: Grossly normal lids, irises & conjunctiva . ENT: grossly tongue is normal . Neck: no obvious mass . Cardiovascular: S1 S2 normal no gallop . Respiratory: No rhonchi coarse breath sounds . Abdomen: soft . Skin: no rash seen on limited exam . Musculoskeletal: not rigid . Psychiatric:unable to assess . Neurologic: no seizure no involuntary movements         Lab Data:   Basic Metabolic Panel: Recent Labs  Lab 02/01/20 0519 02/04/20 0325 02/05/20 0419  NA 138 140  --   K 3.9 4.4  --   CL 95* 98  --   CO2 37* 35*  --   GLUCOSE 124* 135*  --   BUN 10 13  --   CREATININE 1.21 1.23  --   CALCIUM 8.7* 8.7*  --   MG  --  1.6* 1.9  PHOS  --  3.4  --     ABG: No results for input(s): PHART, PCO2ART, PO2ART, HCO3, O2SAT in the last 168 hours.  Liver Function Tests: Recent Labs  Lab 02/04/20 0325  ALBUMIN 2.7*   No results for input(s): LIPASE, AMYLASE in the last 168 hours. No results for input(s): AMMONIA in the last 168 hours.  CBC: Recent Labs  Lab 02/01/20 0519 02/04/20 0325  WBC 7.0 7.3  HGB 12.1* 10.9*  HCT 39.4 36.2*  MCV 90.2 92.6  PLT 281 220    Cardiac Enzymes: No results for input(s): CKTOTAL, CKMB, CKMBINDEX, TROPONINI in the last 168 hours.  BNP (last 3 results) Recent Labs    06/10/19 1152  07/31/19 1217 01/01/20 2028  BNP 75.0 34.0 102.4*    ProBNP (last 3 results) No results for input(s): PROBNP in the last 8760 hours.  Radiological Exams: No results found.  Assessment/Plan Active Problems:   Acute on chronic respiratory failure with hypoxia (HCC)   COPD, severe (HCC)   Sarcoidosis, lung (HCC)   Obstructive sleep apnea   Tracheostomy status (HCC)   1. Acute on chronic respiratory failure hypoxia plan is to continue with capping trials titrate oxygen continue pulmonary toilet 2. Severe COPD at baseline we will continue to follow 3. Sarcoidosis no change 4. Sleep apnea doing fine with Trelegy 5. Tracheostomy will remain in place   I have personally seen and evaluated the patient, evaluated laboratory and imaging results, formulated the assessment and plan and placed orders. The Patient requires high complexity decision making with multiple systems involvement.  Rounds were done with the Respiratory Therapy Director and Staff therapists and discussed with nursing staff also.  Yevonne Pax, MD [facility] Pulmonary Critical Care Medicine Sleep Medicine

## 2020-02-08 LAB — CBC
HCT: 35.9 % — ABNORMAL LOW (ref 39.0–52.0)
Hemoglobin: 11.1 g/dL — ABNORMAL LOW (ref 13.0–17.0)
MCH: 27.9 pg (ref 26.0–34.0)
MCHC: 30.9 g/dL (ref 30.0–36.0)
MCV: 90.2 fL (ref 80.0–100.0)
Platelets: 143 10*3/uL — ABNORMAL LOW (ref 150–400)
RBC: 3.98 MIL/uL — ABNORMAL LOW (ref 4.22–5.81)
RDW: 12.4 % (ref 11.5–15.5)
WBC: 5.8 10*3/uL (ref 4.0–10.5)
nRBC: 0 % (ref 0.0–0.2)

## 2020-02-08 LAB — RENAL FUNCTION PANEL
Albumin: 2.8 g/dL — ABNORMAL LOW (ref 3.5–5.0)
Anion gap: 11 (ref 5–15)
BUN: 13 mg/dL (ref 6–20)
CO2: 30 mmol/L (ref 22–32)
Calcium: 8.3 mg/dL — ABNORMAL LOW (ref 8.9–10.3)
Chloride: 99 mmol/L (ref 98–111)
Creatinine, Ser: 1.41 mg/dL — ABNORMAL HIGH (ref 0.61–1.24)
GFR, Estimated: >60 mL/min (ref >60)
Glucose, Bld: 127 mg/dL — ABNORMAL HIGH (ref 70–99)
Phosphorus: 3.1 mg/dL (ref 2.5–4.6)
Potassium: 4.3 mmol/L (ref 3.5–5.1)
Sodium: 140 mmol/L (ref 135–145)

## 2020-02-08 LAB — MAGNESIUM: Magnesium: 2 mg/dL (ref 1.7–2.4)

## 2020-02-08 NOTE — Progress Notes (Signed)
Pulmonary Critical Care Medicine [facility] [place]   PULMONARY CRITICAL CARE SERVICE  PROGRESS NOTE  Date of Service: 02/08/2020  [name]  [id number]  DOB: 28-Dec-1970   DOA: 01/26/2020  Referring Physician: Carron Curie, MD  HPI: [name] is a 49 y.o. male seen for follow up of Acute on Chronic Respiratory Failure.  Patient currently is on 2 L has been doing fine with capping and using a trilogy at night  Medications: Reviewed on Rounds  Physical Exam:  Vitals: Temperature is 97.4 pulse 77 respiratory rate is 19 blood pressure 143/88 saturations 96%  Ventilator Settings capping off the vent  . General: Comfortable at this time . Eyes: Grossly normal lids, irises & conjunctiva . ENT: grossly tongue is normal . Neck: no obvious mass . Cardiovascular: S1 S2 normal no gallop . Respiratory: Scattered rhonchi expansion is equal . Abdomen: soft . Skin: no rash seen on limited exam . Musculoskeletal: not rigid . Psychiatric:unable to assess . Neurologic: no seizure no involuntary movements         Lab Data:   Basic Metabolic Panel: Recent Labs  Lab 02/04/20 0325 02/05/20 0419 02/08/20 0458  NA 140  --  140  K 4.4  --  4.3  CL 98  --  99  CO2 35*  --  30  GLUCOSE 135*  --  127*  BUN 13  --  13  CREATININE 1.23  --  1.41*  CALCIUM 8.7*  --  8.3*  MG 1.6* 1.9 2.0  PHOS 3.4  --  3.1    ABG: No results for input(s): PHART, PCO2ART, PO2ART, HCO3, O2SAT in the last 168 hours.  Liver Function Tests: Recent Labs  Lab 02/04/20 0325 02/08/20 0458  ALBUMIN 2.7* 2.8*   No results for input(s): LIPASE, AMYLASE in the last 168 hours. No results for input(s): AMMONIA in the last 168 hours.  CBC: Recent Labs  Lab 02/04/20 0325 02/08/20 0458  WBC 7.3 5.8  HGB 10.9* 11.1*  HCT 36.2* 35.9*  MCV 92.6 90.2  PLT 220 143*    Cardiac Enzymes: No results for input(s): CKTOTAL, CKMB, CKMBINDEX, TROPONINI in the last 168 hours.  BNP (last  3 results) Recent Labs    06/10/19 1152 07/31/19 1217 01/01/20 2028  BNP 75.0 34.0 102.4*    ProBNP (last 3 results) No results for input(s): PROBNP in the last 8760 hours.  Radiological Exams: No results found.  Assessment/Plan Active Problems:   Acute on chronic respiratory failure with hypoxia (HCC)   COPD, severe (HCC)   Sarcoidosis, lung (HCC)   Obstructive sleep apnea   Tracheostomy status (HCC)   1. Acute on chronic respiratory failure hypoxia we will continue with capping trials and use the trilogy at night 2. Severe COPD medical management 3. Sarcoidosis no change 4. Sleep apnea doing well 5. Tracheostomy will remain in place   I have personally seen and evaluated the patient, evaluated laboratory and imaging results, formulated the assessment and plan and placed orders. The Patient requires high complexity decision making with multiple systems involvement.  Rounds were done with the Respiratory Therapy Director and Staff therapists and discussed with nursing staff also.  Yevonne Pax, MD [facility] Pulmonary Critical Care Medicine Sleep Medicine

## 2020-02-09 NOTE — Progress Notes (Signed)
Pulmonary Critical Care Medicine [facility] [place]   PULMONARY CRITICAL CARE SERVICE  PROGRESS NOTE  Date of Service: 02/09/2020  [name]  [id number]:[id number]  DOB: 02/05/1971   DOA: 01/26/2020  Referring Physician: Carron Curie, MD  HPI: [name] is a 50 y.o. male seen for follow up of Acute on Chronic Respiratory Failure.  Patient is capping doing well sleeping with a trilogy ventilator at nighttime  Medications: Reviewed on Rounds  Physical Exam:  Vitals: Temperature is 97.4 pulse 79 respiratory rate 21 blood pressure is 149/82 saturations 99%  Ventilator Settings capping off the vent right now  . General: Comfortable at this time . Eyes: Grossly normal lids, irises & conjunctiva . ENT: grossly tongue is normal . Neck: no obvious mass . Cardiovascular: S1 S2 normal no gallop . Respiratory: No rhonchi no rales noted at this time . Abdomen: soft . Skin: no rash seen on limited exam . Musculoskeletal: not rigid . Psychiatric:unable to assess . Neurologic: no seizure no involuntary movements         Lab Data:   Basic Metabolic Panel: Recent Labs  Lab 02/04/20 0325 02/05/20 0419 02/08/20 0458  NA 140  --  140  K 4.4  --  4.3  CL 98  --  99  CO2 35*  --  30  GLUCOSE 135*  --  127*  BUN 13  --  13  CREATININE 1.23  --  1.41*  CALCIUM 8.7*  --  8.3*  MG 1.6* 1.9 2.0  PHOS 3.4  --  3.1    ABG: No results for input(s): PHART, PCO2ART, PO2ART, HCO3, O2SAT in the last 168 hours.  Liver Function Tests: Recent Labs  Lab 02/04/20 0325 02/08/20 0458  ALBUMIN 2.7* 2.8*   No results for input(s): LIPASE, AMYLASE in the last 168 hours. No results for input(s): AMMONIA in the last 168 hours.  CBC: Recent Labs  Lab 02/04/20 0325 02/08/20 0458  WBC 7.3 5.8  HGB 10.9* 11.1*  HCT 36.2* 35.9*  MCV 92.6 90.2  PLT 220 143*    Cardiac Enzymes: No results for input(s): CKTOTAL, CKMB, CKMBINDEX, TROPONINI in the last 168 hours.  BNP (last  3 results) Recent Labs    06/10/19 1152 07/31/19 1217 01/01/20 2028  BNP 75.0 34.0 102.4*    ProBNP (last 3 results) No results for input(s): PROBNP in the last 8760 hours.  Radiological Exams: No results found.  Assessment/Plan Active Problems:   Acute on chronic respiratory failure with hypoxia (HCC)   COPD, severe (HCC)   Sarcoidosis, lung (HCC)   Obstructive sleep apnea   Tracheostomy status (HCC)   1. Acute on chronic respiratory failure with hypoxia we will continue with capping as per orders will do the trilogy ventilator at nighttime. 2. Severe COPD medical management 3. Sarcoidosis at baseline 4. Obstructive sleep apnea on positive airway pressure ventilation 5. Tracheostomy will remain in place   I have personally seen and evaluated the patient, evaluated laboratory and imaging results, formulated the assessment and plan and placed orders. The Patient requires high complexity decision making with multiple systems involvement.  Rounds were done with the Respiratory Therapy Director and Staff therapists and discussed with nursing staff also.  Yevonne Pax, MD [facility] Pulmonary Critical Care Medicine Sleep Medicine

## 2020-02-10 NOTE — Progress Notes (Addendum)
Pulmonary Critical Care Medicine [facility] [place]   PULMONARY CRITICAL CARE SERVICE  PROGRESS NOTE  Date of Service: 02/10/2020  [name]  [id number]  DOB: 1970/10/31   DOA: 01/26/2020  Referring Physician: Carron Curie, MD  HPI: [name] is a 50 y.o. male seen for follow up of Acute on Chronic Respiratory Failure.  Patient is doing well with capping using the trilogy at nighttime  Medications: Reviewed on Rounds  Physical Exam:  Vitals: Temperature is 97.1 pulse 70 respiratory 26 blood pressure is 131/81 saturations 100%  Ventilator Settings 2 L capping trial  . General: Comfortable at this time . Eyes: Grossly normal lids, irises & conjunctiva . ENT: grossly tongue is normal . Neck: no obvious mass . Cardiovascular: S1 S2 normal no gallop . Respiratory: Very coarse breath sounds . Abdomen: soft . Skin: no rash seen on limited exam . Musculoskeletal: not rigid . Psychiatric:unable to assess . Neurologic: no seizure no involuntary movements         Lab Data:   Basic Metabolic Panel: Recent Labs  Lab 02/04/20 0325 02/05/20 0419 02/08/20 0458  NA 140  --  140  K 4.4  --  4.3  CL 98  --  99  CO2 35*  --  30  GLUCOSE 135*  --  127*  BUN 13  --  13  CREATININE 1.23  --  1.41*  CALCIUM 8.7*  --  8.3*  MG 1.6* 1.9 2.0  PHOS 3.4  --  3.1    ABG: No results for input(s): PHART, PCO2ART, PO2ART, HCO3, O2SAT in the last 168 hours.  Liver Function Tests: Recent Labs  Lab 02/04/20 0325 02/08/20 0458  ALBUMIN 2.7* 2.8*   No results for input(s): LIPASE, AMYLASE in the last 168 hours. No results for input(s): AMMONIA in the last 168 hours.  CBC: Recent Labs  Lab 02/04/20 0325 02/08/20 0458  WBC 7.3 5.8  HGB 10.9* 11.1*  HCT 36.2* 35.9*  MCV 92.6 90.2  PLT 220 143*    Cardiac Enzymes: No results for input(s): CKTOTAL, CKMB, CKMBINDEX, TROPONINI in the last 168 hours.  BNP (last 3 results) Recent Labs     06/10/19 1152 07/31/19 1217 01/01/20 2028  BNP 75.0 34.0 102.4*    ProBNP (last 3 results) No results for input(s): PROBNP in the last 8760 hours.  Radiological Exams: No results found.  Assessment/Plan Active Problems:   Acute on chronic respiratory failure with hypoxia (HCC)   COPD, severe (HCC)   Sarcoidosis, lung (HCC)   Obstructive sleep apnea   Tracheostomy status (HCC)   1. Acute on chronic respiratory failure with hypoxia we will continue with capping as ordered 2. Severe COPD at baseline we will continue nebulizers as necessary 3. Sarcoidosis we will continue with supportive care 4. obstructive sleep apnea on the Pap therapy 5. Tracheostomy will remain in place   I have personally seen and evaluated the patient, evaluated laboratory and imaging results, formulated the assessment and plan and placed orders. The Patient requires high complexity decision making with multiple systems involvement.  Rounds were done with the Respiratory Therapy Director and Staff therapists and discussed with nursing staff also.  Yevonne Pax, MD [facility] Pulmonary Critical Care Medicine Sleep Medicine

## 2020-02-11 NOTE — Progress Notes (Signed)
Pulmonary Critical Care Medicine [facility] [place]   PULMONARY CRITICAL CARE SERVICE  PROGRESS NOTE  Date of Service: 02/11/2020  [name]  [id number]  DOB: [birth year]   DOA: 01/26/2020  Referring Physician: Carron Curie, MD  HPI: [name] is a 50 y.o. male seen for follow up of Acute on Chronic Respiratory Failure.  Currently he is doing well with capping and tolerating 2 L of oxygen  Medications: Reviewed on Rounds  Physical Exam:  Vitals: Temperature is 97.6 pulse 75 respiratory 20 blood pressure is 135/82 saturation is 99%  Ventilator Settings capping right now on 2 L O2  . General: Comfortable at this time . Eyes: Grossly normal lids, irises & conjunctiva . ENT: grossly tongue is normal . Neck: no obvious mass . Cardiovascular: S1 S2 normal no gallop . Respiratory: No rhonchi very coarse . Abdomen: soft . Skin: no rash seen on limited exam . Musculoskeletal: not rigid . Psychiatric:unable to assess . Neurologic: no seizure no involuntary movements         Lab Data:   Basic Metabolic Panel: Recent Labs  Lab 02/05/20 0419 02/08/20 0458  NA  --  140  K  --  4.3  CL  --  99  CO2  --  30  GLUCOSE  --  127*  BUN  --  13  CREATININE  --  1.41*  CALCIUM  --  8.3*  MG 1.9 2.0  PHOS  --  3.1    ABG: No results for input(s): PHART, PCO2ART, PO2ART, HCO3, O2SAT in the last 168 hours.  Liver Function Tests: Recent Labs  Lab 02/08/20 0458  ALBUMIN 2.8*   No results for input(s): LIPASE, AMYLASE in the last 168 hours. No results for input(s): AMMONIA in the last 168 hours.  CBC: Recent Labs  Lab 02/08/20 0458  WBC 5.8  HGB 11.1*  HCT 35.9*  MCV 90.2  PLT 143*    Cardiac Enzymes: No results for input(s): CKTOTAL, CKMB, CKMBINDEX, TROPONINI in the last 168 hours.  BNP (last 3 results) Recent Labs    06/10/19 1152 07/31/19 1217 01/01/20 2028  BNP 75.0 34.0 102.4*    ProBNP (last 3 results) No results for  input(s): PROBNP in the last 8760 hours.  Radiological Exams: No results found.  Assessment/Plan Active Problems:   Acute on chronic respiratory failure with hypoxia (HCC)   COPD, severe (HCC)   Sarcoidosis, lung (HCC)   Obstructive sleep apnea   Tracheostomy status (HCC)   1. Acute on chronic respiratory failure with paroxysmal continue with capping and using the trilogy ventilator at nighttime. 2. Severe COPD no change we will continue to follow along 3. Sarcoidosis no change 4. Obstructive sleep apnea doing better with positive airway pressure 5. Tracheostomy will remain in   I have personally seen and evaluated the patient, evaluated laboratory and imaging results, formulated the assessment and plan and placed orders. The Patient requires high complexity decision making with multiple systems involvement.  Rounds were done with the Respiratory Therapy Director and Staff therapists and discussed with nursing staff also.  Yevonne Pax, MD [facility] Pulmonary Critical Care Medicine Sleep Medicine

## 2020-02-12 ENCOUNTER — Ambulatory Visit: Payer: Medicare HMO | Admitting: Family

## 2020-02-12 NOTE — Progress Notes (Signed)
Pulmonary Critical Care Medicine [place]   PULMONARY CRITICAL CARE SERVICE  PROGRESS NOTE  Date of Service: 02/12/2020  [name]  [id number]  DOB: 30-Dec-1970   DOA: 01/26/2020  Referring Physician: Merton Border, MD  HPI: [name] is a 50 y.o. male seen for follow up of Acute on Chronic Respiratory Failure.  Patient currently is capping doing relatively well without any distress noted he has been using the trilogy ventilator at night  Medications: Reviewed on Rounds  Physical Exam:  Vitals: Temperature 96.2 pulse 83 respiratory 20 blood pressure is 136/81 saturations 96%  Ventilator Settings capping off the ventilator  . General: Comfortable at this time . Eyes: Grossly normal lids, irises & conjunctiva . ENT: grossly tongue is normal . Neck: no obvious mass . Cardiovascular: S1 S2 normal no gallop . Respiratory: No rhonchi very coarse breath sound . Abdomen: soft . Skin: no rash seen on limited exam . Musculoskeletal: not rigid . Psychiatric:unable to assess . Neurologic: no seizure no involuntary movements         Lab Data:   Basic Metabolic Panel: Recent Labs  Lab 02/08/20 0458  NA 140  K 4.3  CL 99  CO2 30  GLUCOSE 127*  BUN 13  CREATININE 1.41*  CALCIUM 8.3*  MG 2.0  PHOS 3.1    ABG: No results for input(s): PHART, PCO2ART, PO2ART, HCO3, O2SAT in the last 168 hours.  Liver Function Tests: Recent Labs  Lab 02/08/20 0458  ALBUMIN 2.8*   No results for input(s): LIPASE, AMYLASE in the last 168 hours. No results for input(s): AMMONIA in the last 168 hours.  CBC: Recent Labs  Lab 02/08/20 0458  WBC 5.8  HGB 11.1*  HCT 35.9*  MCV 90.2  PLT 143*    Cardiac Enzymes: No results for input(s): CKTOTAL, CKMB, CKMBINDEX, TROPONINI in the last 168 hours.  BNP (last 3 results) Recent Labs    06/10/19 1152 07/31/19 1217 01/01/20 2028  BNP 75.0 34.0 102.4*    ProBNP (last 3 results) No results for  input(s): PROBNP in the last 8760 hours.  Radiological Exams: No results found.  Assessment/Plan Active Problems:   Acute on chronic respiratory failure with hypoxia (HCC)   COPD, severe (HCC)   Sarcoidosis, lung ([place])   Obstructive sleep apnea   Tracheostomy status ([place])   1. Acute on chronic respiratory failure with hypoxia we will continue with capping trials at baseline 2. Severe COPD medical management 3. Sarcoidosis at baseline 4. Obstructive sleep apnea doing well with trilogy ventilation 5. Tracheostomy will remain in place   I have personally seen and evaluated the patient, evaluated laboratory and imaging results, formulated the assessment and plan and placed orders. The Patient requires high complexity decision making with multiple systems involvement.  Rounds were done with the Respiratory Therapy Director and Staff therapists and discussed with nursing staff also.  Allyne Gee, MD [facility] Pulmonary Critical Care Medicine Sleep Medicine

## 2020-02-13 NOTE — Progress Notes (Signed)
Pulmonary Critical Care Medicine [place]   PULMONARY CRITICAL CARE SERVICE  PROGRESS NOTE  Date of Service: 02/13/2020  [name]  [id number]  DOB: [birth year]   DOA: 01/26/2020  Referring Physician: Merton Border, MD  HPI: [name] is a 50 y.o. male seen for follow up of Acute on Chronic Respiratory Failure.  Patient currently is capping has been on room air good saturations.  Medications: Reviewed on Rounds  Physical Exam:  Vitals: Temperature 97.0 pulse 98 respiratory rate is 20 blood pressure is 146/81 saturations 100%  Ventilator Settings capping off the ventilator.  . General: Comfortable at this time . Eyes: Grossly normal lids, irises & conjunctiva . ENT: grossly tongue is normal . Neck: no obvious mass . Cardiovascular: S1 S2 normal no gallop . Respiratory: No rhonchi very coarse breath sounds . Abdomen: soft . Skin: no rash seen on limited exam . Musculoskeletal: not rigid . Psychiatric:unable to assess . Neurologic: no seizure no involuntary movements         Lab Data:   Basic Metabolic Panel: Recent Labs  Lab 02/08/20 0458  NA 140  K 4.3  CL 99  CO2 30  GLUCOSE 127*  BUN 13  CREATININE 1.41*  CALCIUM 8.3*  MG 2.0  PHOS 3.1    ABG: No results for input(s): PHART, PCO2ART, PO2ART, HCO3, O2SAT in the last 168 hours.  Liver Function Tests: Recent Labs  Lab 02/08/20 0458  ALBUMIN 2.8*   No results for input(s): LIPASE, AMYLASE in the last 168 hours. No results for input(s): AMMONIA in the last 168 hours.  CBC: Recent Labs  Lab 02/08/20 0458  WBC 5.8  HGB 11.1*  HCT 35.9*  MCV 90.2  PLT 143*    Cardiac Enzymes: No results for input(s): CKTOTAL, CKMB, CKMBINDEX, TROPONINI in the last 168 hours.  BNP (last 3 results) Recent Labs    06/10/19 1152 07/31/19 1217 01/01/20 2028  BNP 75.0 34.0 102.4*    ProBNP (last 3 results) No results for input(s): PROBNP in the last 8760  hours.  Radiological Exams: No results found.  Assessment/Plan Active Problems:   Acute on chronic respiratory failure with hypoxia (HCC)   COPD, severe (HCC)   Sarcoidosis, lung ([place])   Obstructive sleep apnea   Tracheostomy status ([place])   1. Acute on chronic respiratory failure hypoxia patient currently is capping on room air without any distress at this time. 2. Severe COPD medical management. 3. Sarcoidosis 4. Obstructive sleep apnea baseline 5. Tracheostomy remains in place   I have personally seen and evaluated the patient, evaluated laboratory and imaging results, formulated the assessment and plan and placed orders. The Patient requires high complexity decision making with multiple systems involvement.  Rounds were done with the Respiratory Therapy Director and Staff therapists and discussed with nursing staff also.  Allyne Gee, MD [facility] Pulmonary Critical Care Medicine Sleep Medicine

## 2020-02-14 LAB — BASIC METABOLIC PANEL
Anion gap: 6 (ref 5–15)
BUN: 11 mg/dL (ref 6–20)
CO2: 34 mmol/L — ABNORMAL HIGH (ref 22–32)
Calcium: 8.4 mg/dL — ABNORMAL LOW (ref 8.9–10.3)
Chloride: 99 mmol/L (ref 98–111)
Creatinine, Ser: 1.11 mg/dL (ref 0.61–1.24)
GFR, Estimated: >60 mL/min (ref >60)
Glucose, Bld: 141 mg/dL — ABNORMAL HIGH (ref 70–99)
Potassium: 4 mmol/L (ref 3.5–5.1)
Sodium: 139 mmol/L (ref 135–145)

## 2020-02-14 LAB — MAGNESIUM: Magnesium: 1.8 mg/dL (ref 1.7–2.4)

## 2020-02-14 LAB — CBC
HCT: 32.7 % — ABNORMAL LOW (ref 39.0–52.0)
Hemoglobin: 10.1 g/dL — ABNORMAL LOW (ref 13.0–17.0)
MCH: 27.7 pg (ref 26.0–34.0)
MCHC: 30.9 g/dL (ref 30.0–36.0)
MCV: 89.8 fL (ref 80.0–100.0)
Platelets: 130 10*3/uL — ABNORMAL LOW (ref 150–400)
RBC: 3.64 MIL/uL — ABNORMAL LOW (ref 4.22–5.81)
RDW: 12.6 % (ref 11.5–15.5)
WBC: 5 10*3/uL (ref 4.0–10.5)
nRBC: 0 % (ref 0.0–0.2)

## 2020-02-14 NOTE — Progress Notes (Signed)
Pulmonary Critical Care Medicine [place]   PULMONARY CRITICAL CARE SERVICE  PROGRESS NOTE  Date of Service: 02/14/2020  [name]  [id number]  DOB: March 08, 1970   DOA: 01/26/2020  Referring Physician: Merton Border, MD  HPI: [name] is a 50 y.o. male seen for follow up of Acute on Chronic Respiratory Failure.  Doing well with capping trials right now is on 2 L of O2  Medications: Reviewed on Rounds  Physical Exam:  Vitals: Temperature is 97.0 pulse 80 respiratory rate 20 blood pressures 138/88 saturations 99%  Ventilator Settings capping off the ventilator  . General: Comfortable at this time . Eyes: Grossly normal lids, irises & conjunctiva . ENT: grossly tongue is normal . Neck: no obvious mass . Cardiovascular: S1 S2 normal no gallop . Respiratory: No rhonchi no rales noted at this time . Abdomen: soft . Skin: no rash seen on limited exam . Musculoskeletal: not rigid . Psychiatric:unable to assess . Neurologic: no seizure no involuntary movements         Lab Data:   Basic Metabolic Panel: Recent Labs  Lab 02/08/20 0458 02/14/20 0331  NA 140 139  K 4.3 4.0  CL 99 99  CO2 30 34*  GLUCOSE 127* 141*  BUN 13 11  CREATININE 1.41* 1.11  CALCIUM 8.3* 8.4*  MG 2.0 1.8  PHOS 3.1  --     ABG: No results for input(s): PHART, PCO2ART, PO2ART, HCO3, O2SAT in the last 168 hours.  Liver Function Tests: Recent Labs  Lab 02/08/20 0458  ALBUMIN 2.8*   No results for input(s): LIPASE, AMYLASE in the last 168 hours. No results for input(s): AMMONIA in the last 168 hours.  CBC: Recent Labs  Lab 02/08/20 0458 02/14/20 0331  WBC 5.8 5.0  HGB 11.1* 10.1*  HCT 35.9* 32.7*  MCV 90.2 89.8  PLT 143* 130*    Cardiac Enzymes: No results for input(s): CKTOTAL, CKMB, CKMBINDEX, TROPONINI in the last 168 hours.  BNP (last 3 results) Recent Labs    06/10/19 1152 07/31/19 1217 01/01/20 2028  BNP 75.0 34.0 102.4*    ProBNP  (last 3 results) No results for input(s): PROBNP in the last 8760 hours.  Radiological Exams: No results found.  Assessment/Plan Active Problems:   Acute on chronic respiratory failure with hypoxia (HCC)   COPD, severe (HCC)   Sarcoidosis, lung ([place])   Obstructive sleep apnea   Tracheostomy status ([place])   1. Acute on chronic respiratory failure hypoxia we will continue with capping trials titrate oxygen continue pulmonary toilet 2. Severe COPD medical management 3. Sarcoidosis no change 4. Obstructive sleep apnea patient is using the positive airway pressure 5. Tracheostomy remains in place   I have personally seen and evaluated the patient, evaluated laboratory and imaging results, formulated the assessment and plan and placed orders. The Patient requires high complexity decision making with multiple systems involvement.  Rounds were done with the Respiratory Therapy Director and Staff therapists and discussed with nursing staff also.  Allyne Gee, MD [facility] Pulmonary Critical Care Medicine Sleep Medicine

## 2020-02-15 NOTE — Progress Notes (Signed)
Pulmonary Critical Care Medicine [place]   PULMONARY CRITICAL CARE SERVICE  PROGRESS NOTE  Date of Service: 02/15/2020  [name]  [id number]  DOB: 1970-05-22   DOA: 01/26/2020  Referring Physician: Merton Border, MD  HPI: [name] is a 50 y.o. male seen for follow up of Acute on Chronic Respiratory Failure.  Patient is capping currently on 2 L using BiPAP  Medications: Reviewed on Rounds  Physical Exam:  Vitals: Temperature is 98.3 pulse 81 respiratory rate 20 blood pressure is 139/94 saturations 99%  Ventilator Settings capping off the ventilator  . General: Comfortable at this time . Eyes: Grossly normal lids, irises & conjunctiva . ENT: grossly tongue is normal . Neck: no obvious mass . Cardiovascular: S1 S2 normal no gallop . Respiratory: No rhonchi coarse breath sound . Abdomen: soft . Skin: no rash seen on limited exam . Musculoskeletal: not rigid . Psychiatric:unable to assess . Neurologic: no seizure no involuntary movements         Lab Data:   Basic Metabolic Panel: Recent Labs  Lab 02/14/20 0331  NA 139  K 4.0  CL 99  CO2 34*  GLUCOSE 141*  BUN 11  CREATININE 1.11  CALCIUM 8.4*  MG 1.8    ABG: No results for input(s): PHART, PCO2ART, PO2ART, HCO3, O2SAT in the last 168 hours.  Liver Function Tests: No results for input(s): AST, ALT, ALKPHOS, BILITOT, PROT, ALBUMIN in the last 168 hours. No results for input(s): LIPASE, AMYLASE in the last 168 hours. No results for input(s): AMMONIA in the last 168 hours.  CBC: Recent Labs  Lab 02/14/20 0331  WBC 5.0  HGB 10.1*  HCT 32.7*  MCV 89.8  PLT 130*    Cardiac Enzymes: No results for input(s): CKTOTAL, CKMB, CKMBINDEX, TROPONINI in the last 168 hours.  BNP (last 3 results) Recent Labs    06/10/19 1152 07/31/19 1217 01/01/20 2028  BNP 75.0 34.0 102.4*    ProBNP (last 3 results) No results for input(s): PROBNP in the last 8760  hours.  Radiological Exams: No results found.  Assessment/Plan Active Problems:   Acute on chronic respiratory failure with hypoxia (HCC)   COPD, severe (HCC)   Sarcoidosis, lung ([place])   Obstructive sleep apnea   Tracheostomy status ([place])   1. Acute on chronic respiratory failure hypoxia we will continue with capping trials patient has been doing fine with 2 L of O2 2. Severe COPD no change continue to monitor 3. Sarcoidosis patient is on baseline 4. Obstructive sleep apnea using PAP therapy 5. Tracheostomy will remain in place   I have personally seen and evaluated the patient, evaluated laboratory and imaging results, formulated the assessment and plan and placed orders. The Patient requires high complexity decision making with multiple systems involvement.  Rounds were done with the Respiratory Therapy Director and Staff therapists and discussed with nursing staff also.  Allyne Gee, MD [facility] Pulmonary Critical Care Medicine Sleep Medicine

## 2020-07-23 ENCOUNTER — Encounter: Payer: Self-pay | Admitting: Intensive Care

## 2020-07-23 ENCOUNTER — Emergency Department: Payer: Medicare HMO

## 2020-07-23 ENCOUNTER — Other Ambulatory Visit: Payer: Self-pay

## 2020-07-23 ENCOUNTER — Inpatient Hospital Stay
Admission: EM | Admit: 2020-07-23 | Discharge: 2020-07-26 | DRG: 208 | Disposition: A | Discharge status: Home / Self Care | Payer: Medicare HMO | Attending: Pulmonary Disease | Admitting: Pulmonary Disease

## 2020-07-23 DIAGNOSIS — E1122 Type 2 diabetes mellitus with diabetic chronic kidney disease: Secondary | ICD-10-CM | POA: Diagnosis present

## 2020-07-23 DIAGNOSIS — I480 Paroxysmal atrial fibrillation: Secondary | ICD-10-CM | POA: Diagnosis present

## 2020-07-23 DIAGNOSIS — I5032 Chronic diastolic (congestive) heart failure: Secondary | ICD-10-CM | POA: Diagnosis present

## 2020-07-23 DIAGNOSIS — I252 Old myocardial infarction: Secondary | ICD-10-CM | POA: Diagnosis not present

## 2020-07-23 DIAGNOSIS — E1169 Type 2 diabetes mellitus with other specified complication: Secondary | ICD-10-CM

## 2020-07-23 DIAGNOSIS — I10 Essential (primary) hypertension: Secondary | ICD-10-CM | POA: Diagnosis present

## 2020-07-23 DIAGNOSIS — R0602 Shortness of breath: Secondary | ICD-10-CM | POA: Diagnosis present

## 2020-07-23 DIAGNOSIS — Z9049 Acquired absence of other specified parts of digestive tract: Secondary | ICD-10-CM

## 2020-07-23 DIAGNOSIS — I2510 Atherosclerotic heart disease of native coronary artery without angina pectoris: Secondary | ICD-10-CM | POA: Diagnosis present

## 2020-07-23 DIAGNOSIS — F17210 Nicotine dependence, cigarettes, uncomplicated: Secondary | ICD-10-CM | POA: Diagnosis present

## 2020-07-23 DIAGNOSIS — G4733 Obstructive sleep apnea (adult) (pediatric): Secondary | ICD-10-CM | POA: Diagnosis present

## 2020-07-23 DIAGNOSIS — I4510 Unspecified right bundle-branch block: Secondary | ICD-10-CM | POA: Diagnosis present

## 2020-07-23 DIAGNOSIS — E785 Hyperlipidemia, unspecified: Secondary | ICD-10-CM | POA: Diagnosis present

## 2020-07-23 DIAGNOSIS — Z85528 Personal history of other malignant neoplasm of kidney: Secondary | ICD-10-CM | POA: Diagnosis not present

## 2020-07-23 DIAGNOSIS — Y838 Other surgical procedures as the cause of abnormal reaction of the patient, or of later complication, without mention of misadventure at the time of the procedure: Secondary | ICD-10-CM | POA: Diagnosis present

## 2020-07-23 DIAGNOSIS — Z880 Allergy status to penicillin: Secondary | ICD-10-CM | POA: Diagnosis not present

## 2020-07-23 DIAGNOSIS — D860 Sarcoidosis of lung: Secondary | ICD-10-CM | POA: Diagnosis present

## 2020-07-23 DIAGNOSIS — J9622 Acute and chronic respiratory failure with hypercapnia: Principal | ICD-10-CM | POA: Diagnosis present

## 2020-07-23 DIAGNOSIS — N1831 Chronic kidney disease, stage 3a: Secondary | ICD-10-CM | POA: Diagnosis present

## 2020-07-23 DIAGNOSIS — I452 Bifascicular block: Secondary | ICD-10-CM | POA: Diagnosis present

## 2020-07-23 DIAGNOSIS — E7800 Pure hypercholesterolemia, unspecified: Secondary | ICD-10-CM | POA: Diagnosis present

## 2020-07-23 DIAGNOSIS — Z930 Tracheostomy status: Secondary | ICD-10-CM

## 2020-07-23 DIAGNOSIS — Z833 Family history of diabetes mellitus: Secondary | ICD-10-CM

## 2020-07-23 DIAGNOSIS — J449 Chronic obstructive pulmonary disease, unspecified: Secondary | ICD-10-CM | POA: Diagnosis present

## 2020-07-23 DIAGNOSIS — Z8249 Family history of ischemic heart disease and other diseases of the circulatory system: Secondary | ICD-10-CM

## 2020-07-23 DIAGNOSIS — Z20822 Contact with and (suspected) exposure to covid-19: Secondary | ICD-10-CM | POA: Diagnosis present

## 2020-07-23 DIAGNOSIS — Z7982 Long term (current) use of aspirin: Secondary | ICD-10-CM

## 2020-07-23 DIAGNOSIS — J9602 Acute respiratory failure with hypercapnia: Secondary | ICD-10-CM

## 2020-07-23 DIAGNOSIS — Z7901 Long term (current) use of anticoagulants: Secondary | ICD-10-CM

## 2020-07-23 DIAGNOSIS — J9503 Malfunction of tracheostomy stoma: Secondary | ICD-10-CM | POA: Diagnosis present

## 2020-07-23 DIAGNOSIS — I5043 Acute on chronic combined systolic (congestive) and diastolic (congestive) heart failure: Secondary | ICD-10-CM

## 2020-07-23 DIAGNOSIS — Z7951 Long term (current) use of inhaled steroids: Secondary | ICD-10-CM

## 2020-07-23 DIAGNOSIS — E872 Acidosis: Secondary | ICD-10-CM | POA: Diagnosis present

## 2020-07-23 DIAGNOSIS — I130 Hypertensive heart and chronic kidney disease with heart failure and stage 1 through stage 4 chronic kidney disease, or unspecified chronic kidney disease: Secondary | ICD-10-CM | POA: Diagnosis present

## 2020-07-23 DIAGNOSIS — Z8673 Personal history of transient ischemic attack (TIA), and cerebral infarction without residual deficits: Secondary | ICD-10-CM

## 2020-07-23 DIAGNOSIS — Z905 Acquired absence of kidney: Secondary | ICD-10-CM | POA: Diagnosis not present

## 2020-07-23 DIAGNOSIS — J9621 Acute and chronic respiratory failure with hypoxia: Secondary | ICD-10-CM | POA: Diagnosis present

## 2020-07-23 DIAGNOSIS — Z79899 Other long term (current) drug therapy: Secondary | ICD-10-CM

## 2020-07-23 DIAGNOSIS — Z794 Long term (current) use of insulin: Secondary | ICD-10-CM

## 2020-07-23 DIAGNOSIS — J9620 Acute and chronic respiratory failure, unspecified whether with hypoxia or hypercapnia: Secondary | ICD-10-CM

## 2020-07-23 DIAGNOSIS — E119 Type 2 diabetes mellitus without complications: Secondary | ICD-10-CM

## 2020-07-23 HISTORY — DX: Paroxysmal atrial fibrillation: I48.0

## 2020-07-23 LAB — BLOOD GAS, ARTERIAL
Acid-Base Excess: 9.9 mmol/L — ABNORMAL HIGH (ref 0.0–2.0)
Bicarbonate: 37.2 mmol/L — ABNORMAL HIGH (ref 20.0–28.0)
FIO2: 0.3
MECHVT: 500 mL
O2 Saturation: 93.8 %
PEEP: 5 cmH2O
Patient temperature: 37
RATE: 18 resp/min
pCO2 arterial: 60 mmHg — ABNORMAL HIGH (ref 32.0–48.0)
pH, Arterial: 7.4 (ref 7.350–7.450)
pO2, Arterial: 70 mmHg — ABNORMAL LOW (ref 83.0–108.0)

## 2020-07-23 LAB — CBC WITH DIFFERENTIAL/PLATELET
Abs Immature Granulocytes: 0.02 10*3/uL (ref 0.00–0.07)
Basophils Absolute: 0 10*3/uL (ref 0.0–0.1)
Basophils Relative: 1 %
Eosinophils Absolute: 0.1 10*3/uL (ref 0.0–0.5)
Eosinophils Relative: 1 %
HCT: 43.9 % (ref 39.0–52.0)
Hemoglobin: 14.3 g/dL (ref 13.0–17.0)
Immature Granulocytes: 0 %
Lymphocytes Relative: 22 %
Lymphs Abs: 1.3 10*3/uL (ref 0.7–4.0)
MCH: 28.2 pg (ref 26.0–34.0)
MCHC: 32.6 g/dL (ref 30.0–36.0)
MCV: 86.6 fL (ref 80.0–100.0)
Monocytes Absolute: 0.8 10*3/uL (ref 0.1–1.0)
Monocytes Relative: 14 %
Neutro Abs: 3.5 10*3/uL (ref 1.7–7.7)
Neutrophils Relative %: 62 %
Platelets: 220 10*3/uL (ref 150–400)
RBC: 5.07 MIL/uL (ref 4.22–5.81)
RDW: 13.2 % (ref 11.5–15.5)
WBC: 5.7 10*3/uL (ref 4.0–10.5)
nRBC: 0 % (ref 0.0–0.2)

## 2020-07-23 LAB — COMPREHENSIVE METABOLIC PANEL
ALT: 33 U/L (ref 0–44)
AST: 25 U/L (ref 15–41)
Albumin: 3.8 g/dL (ref 3.5–5.0)
Alkaline Phosphatase: 65 U/L (ref 38–126)
Anion gap: 5 (ref 5–15)
BUN: 20 mg/dL (ref 6–20)
CO2: 33 mmol/L — ABNORMAL HIGH (ref 22–32)
Calcium: 8.4 mg/dL — ABNORMAL LOW (ref 8.9–10.3)
Chloride: 101 mmol/L (ref 98–111)
Creatinine, Ser: 1.53 mg/dL — ABNORMAL HIGH (ref 0.61–1.24)
GFR, Estimated: 55 mL/min — ABNORMAL LOW (ref >60)
Glucose, Bld: 134 mg/dL — ABNORMAL HIGH (ref 70–99)
Potassium: 4.4 mmol/L (ref 3.5–5.1)
Sodium: 139 mmol/L (ref 135–145)
Total Bilirubin: 0.6 mg/dL (ref 0.3–1.2)
Total Protein: 7 g/dL (ref 6.5–8.1)

## 2020-07-23 LAB — TROPONIN I (HIGH SENSITIVITY)
Troponin I (High Sensitivity): 35 ng/L — ABNORMAL HIGH (ref <18)
Troponin I (High Sensitivity): 34 ng/L — ABNORMAL HIGH (ref <18)

## 2020-07-23 LAB — BLOOD GAS, VENOUS
Acid-Base Excess: 9.8 mmol/L — ABNORMAL HIGH (ref 0.0–2.0)
Bicarbonate: 41.7 mmol/L — ABNORMAL HIGH (ref 20.0–28.0)
O2 Saturation: 46.7 %
Patient temperature: 37
pCO2, Ven: 93 mmHg (ref 44.0–60.0)
pH, Ven: 7.26 (ref 7.250–7.430)
pO2, Ven: <31 mmHg — CL (ref 32.0–45.0)

## 2020-07-23 LAB — RESP PANEL BY RT-PCR (FLU A&B, COVID) ARPGX2
Influenza A by PCR: NEGATIVE
Influenza B by PCR: NEGATIVE
SARS Coronavirus 2 by RT PCR: NEGATIVE

## 2020-07-23 MED ORDER — APIXABAN 5 MG PO TABS
5.0000 mg | ORAL_TABLET | Freq: Two times a day (BID) | ORAL | Status: DC
  Administered 2020-07-24 – 2020-07-26 (×5): 5 mg via ORAL
  Filled 2020-07-23 (×5): qty 1

## 2020-07-23 MED ORDER — POLYETHYLENE GLYCOL 3350 17 G PO PACK: 17.0000 g | PACK | Freq: Every day | ORAL | Status: DC | PRN

## 2020-07-23 MED ORDER — DOCUSATE SODIUM 100 MG PO CAPS: 100.0000 mg | ORAL_CAPSULE | Freq: Two times a day (BID) | ORAL | Status: DC | PRN

## 2020-07-23 MED ORDER — INSULIN ASPART 100 UNIT/ML IJ SOLN
0.0000 [IU] | Freq: Three times a day (TID) | INTRAMUSCULAR | Status: DC
Start: 2020-07-23 — End: 2020-07-26
  Administered 2020-07-24: 3 [IU] via SUBCUTANEOUS
  Administered 2020-07-24: 2 [IU] via SUBCUTANEOUS
  Administered 2020-07-24 – 2020-07-25 (×4): 3 [IU] via SUBCUTANEOUS
  Administered 2020-07-25: 2 [IU] via SUBCUTANEOUS
  Administered 2020-07-25: 3 [IU] via SUBCUTANEOUS
  Administered 2020-07-26: 2 [IU] via SUBCUTANEOUS
  Filled 2020-07-23 (×10): qty 1

## 2020-07-23 MED ORDER — POTASSIUM CHLORIDE CRYS ER 20 MEQ PO TBCR
20.0000 meq | EXTENDED_RELEASE_TABLET | Freq: Every day | ORAL | Status: DC
  Administered 2020-07-24 – 2020-07-26 (×3): 20 meq via ORAL
  Filled 2020-07-23 (×3): qty 1

## 2020-07-23 MED ORDER — INSULIN ASPART 100 UNIT/ML IJ SOLN
0.0000 [IU] | Freq: Three times a day (TID) | INTRAMUSCULAR | Status: DC
Start: 2020-07-24 — End: 2020-07-23

## 2020-07-23 MED ORDER — METHYLPREDNISOLONE SODIUM SUCC 40 MG IJ SOLR
40.0000 mg | Freq: Every day | INTRAMUSCULAR | Status: DC
  Administered 2020-07-24 – 2020-07-26 (×3): 40 mg via INTRAVENOUS
  Filled 2020-07-23 (×3): qty 1

## 2020-07-23 MED ORDER — IPRATROPIUM-ALBUTEROL 0.5-2.5 (3) MG/3ML IN SOLN
3.0000 mL | Freq: Once | RESPIRATORY_TRACT | Status: AC
  Administered 2020-07-23: 3 mL via RESPIRATORY_TRACT
  Filled 2020-07-23: qty 3

## 2020-07-23 MED ORDER — MOMETASONE FURO-FORMOTEROL FUM 200-5 MCG/ACT IN AERO
2.0000 | INHALATION_SPRAY | Freq: Two times a day (BID) | RESPIRATORY_TRACT | Status: DC
  Administered 2020-07-24 – 2020-07-26 (×5): 2 via RESPIRATORY_TRACT
  Filled 2020-07-23: qty 8.8

## 2020-07-23 MED ORDER — IPRATROPIUM-ALBUTEROL 0.5-2.5 (3) MG/3ML IN SOLN
3.0000 mL | RESPIRATORY_TRACT | Status: DC | PRN
  Administered 2020-07-24: 3 mL via RESPIRATORY_TRACT
  Filled 2020-07-23: qty 3

## 2020-07-23 MED ORDER — DEXAMETHASONE SODIUM PHOSPHATE 10 MG/ML IJ SOLN
10.0000 mg | Freq: Once | INTRAMUSCULAR | Status: AC
  Administered 2020-07-23: 10 mg via INTRAVENOUS
  Filled 2020-07-23: qty 1

## 2020-07-23 MED ORDER — METOCLOPRAMIDE HCL 5 MG/ML IJ SOLN
10.0000 mg | Freq: Once | INTRAMUSCULAR | Status: AC
  Administered 2020-07-23: 10 mg via INTRAVENOUS
  Filled 2020-07-23: qty 2

## 2020-07-23 MED ORDER — ATORVASTATIN CALCIUM 20 MG PO TABS
40.0000 mg | ORAL_TABLET | Freq: Every day | ORAL | Status: DC
  Administered 2020-07-24 – 2020-07-25 (×2): 40 mg via ORAL
  Filled 2020-07-23 (×2): qty 2

## 2020-07-23 NOTE — ED Notes (Signed)
Switched out trach to cuffed trach with resp and placed on vent 100% fio2. Called and updated wife. Clear secretions suctioned from mouth and internally

## 2020-07-23 NOTE — ED Notes (Addendum)
Crackers and po fluids provided with provider approval.  Resp called for humidifier bottle upon request.

## 2020-07-23 NOTE — ED Notes (Signed)
NP intensivist bedside

## 2020-07-23 NOTE — ED Provider Notes (Signed)
[facility] Emergency Department Provider Note    Event Date/Time   First MD Initiated Contact with Patient 07/23/20 1827     (approximate)  I have reviewed the triage vital signs and the nursing notes.   HISTORY  Chief Complaint Shortness of Breath and Headache    HPI [name] is a 50 y.o. male with extensive past medical history list listed below presents to the ER for evaluation of generalized malaise congestion lightheadedness headache exertional dyspnea for the past several days.  No measured fevers at home.  Denies any swelling no pain with deep inspiration.  Is on Eliquis for history of A. fib has been compliant with his medication.  Does wear 2 L nasal cannula.  Feels very fatigued and states he is not been sleeping as well over the past few days.  No known sick contacts.  No recent antibiotics.  No recent steroids.  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   Stroke ([place])    Tracheostomy status ([place])    Family History  Problem Relation Age of Onset   Hypertension Mother    Heart disease Mother    Diabetes Mother    Cancer Father    Past Surgical History:  Procedure Laterality Date   APPENDECTOMY     PARTIAL NEPHRECTOMY     TRACHEOSTOMY TUBE PLACEMENT N/A 01/14/2020   Procedure: TRACHEOSTOMY;  Surgeon: Clyde Canterbury, MD;  Location: [facility] ORS;  Service:  ENT;  Laterality: N/A;   Patient Active Problem List   Diagnosis Date Noted   Acute on chronic respiratory failure with hypoxia ([place])    COPD, severe ([place])    Sarcoidosis, lung ([place])    Obstructive sleep apnea    Tracheostomy status ([place])    Acute kidney failure, unspecified ([place])    Acute and chr resp failure, unsp w hypoxia or hypercapnia ([place]) 01/02/2020   Acute respiratory failure with hypoxia and hypercapnia ([place]) 01/01/2020   CKD stage 3 due to type 2 diabetes mellitus ([place]) 01/01/2020   Respiratory failure with hypercapnia ([place]) 07/31/2019   Acute on chronic respiratory failure with hypoxia ([place]) 06/10/2019   Acute on chronic respiratory failure with hypoxia and hypercapnia ([place]) 11/17/2018   Acute exacerbation of CHF (congestive heart failure) ([place]) 11/07/2018   Chest pain 11/06/2018   Acute exacerbation of chronic obstructive pulmonary disease (COPD) ([place]) 07/29/2018   COPD with acute exacerbation ([place]) 02/03/2018   COPD (chronic obstructive pulmonary disease) ([place]) 07/16/2016   Chronic diastolic heart failure ([place]) 03/23/2016   Hypercarbia    COPD exacerbation ([place]) 11/17/2015   HTN (hypertension) 06/14/2015   Dental caries 03/08/2015   Morbid obesity ([place]) 02/21/2015   OSA and COPD overlap syndrome ([place]) 02/21/2015   Thrombocytopenia ([place]) 02/21/2015   Hypernatremia 02/21/2015  Type 2 diabetes mellitus ([place]) 10/19/2014   Chronic combined systolic and diastolic congestive heart failure ([place]) 10/18/2014   Chronic obstructive pulmonary disease ([place]) 10/18/2014   Sarcoidosis 04/22/2013   Hypercholesteremia 04/22/2013      Prior to Admission medications   Not on File    Allergies Penicillins    Social History Social History   Tobacco Use   Smoking status: Every Day    Years: 25.00    Pack years: 0.00    Types: Cigarettes   Smokeless tobacco: Never  Vaping Use   Vaping Use: Never used  Substance Use Topics   Alcohol use: Yes    Comment: occassional    Drug use: Yes    Types: Marijuana, PCP    Review of Systems Patient denies headaches, rhinorrhea, blurry vision, numbness, shortness of breath, chest pain, edema, cough, abdominal pain, nausea, vomiting, diarrhea, dysuria, fevers, rashes or hallucinations unless otherwise stated above in HPI. ____________________________________________   PHYSICAL EXAM:  VITAL SIGNS: Vitals:   07/23/20 1727 07/23/20 1730  BP:  (!) 134/96  Pulse:  73  Resp:  (!) 22  Temp: 98.3 F (36.8 C)   SpO2:  98%    Constitutional: Alert and oriented.  Eyes: Conjunctivae are normal.  Head: Atraumatic. Nose: No congestion/rhinnorhea. Mouth/Throat: Mucous membranes are moist.   Neck: No stridor. Painless ROM.  Cardiovascular: Normal rate, regular rhythm. Grossly normal heart sounds.  Good peripheral circulation. Respiratory: Normal respiratory effort.  No retractions. Lungs with faint expiratory wheeze, diminished bibasilar BS Gastrointestinal: Soft and nontender. No distention. No abdominal bruits. No CVA tenderness. Genitourinary:  Musculoskeletal: No lower extremity tenderness nor edema.  No joint effusions. Neurologic:  Normal speech and language. No gross focal neurologic deficits are appreciated. No facial droop Skin:  Skin is warm, dry and intact. No rash noted. Psychiatric: Mood and affect are normal. Speech and behavior are normal.  ____________________________________________   LABS (all labs ordered are listed, but only abnormal results are displayed)  Results for orders placed or performed during the hospital encounter of 07/23/20 (from the past 24 hour(s))  CBC with Differential     Status: None   Collection Time: 07/23/20  5:34 PM  Result Value Ref Range   WBC 5.7 4.0 - 10.5 K/uL   RBC 5.07 4.22 - 5.81 MIL/uL   Hemoglobin 14.3 13.0 - 17.0 g/dL   HCT 43.9 39.0 - 52.0 %   MCV 86.6 80.0 - 100.0 fL   MCH 28.2 26.0 - 34.0 pg   MCHC 32.6 30.0 - 36.0 g/dL   RDW 13.2 11.5 - 15.5 %    Platelets 220 150 - 400 K/uL   nRBC 0.0 0.0 - 0.2 %   Neutrophils Relative % 62 %   Neutro Abs 3.5 1.7 - 7.7 K/uL   Lymphocytes Relative 22 %   Lymphs Abs 1.3 0.7 - 4.0 K/uL   Monocytes Relative 14 %   Monocytes Absolute 0.8 0.1 - 1.0 K/uL   Eosinophils Relative 1 %   Eosinophils Absolute 0.1 0.0 - 0.5 K/uL   Basophils Relative 1 %   Basophils Absolute 0.0 0.0 - 0.1 K/uL   Immature Granulocytes 0 %   Abs Immature Granulocytes 0.02 0.00 - 0.07 K/uL  Comprehensive metabolic panel     Status: Abnormal   Collection Time: 07/23/20  5:34 PM  Result Value Ref Range   Sodium 139 135 - 145 mmol/L   Potassium 4.4 3.5 - 5.1 mmol/L   Chloride 101 98 - 111  mmol/L   CO2 33 (H) 22 - 32 mmol/L   Glucose, Bld 134 (H) 70 - 99 mg/dL   BUN 20 6 - 20 mg/dL   Creatinine, Ser 1.53 (H) 0.61 - 1.24 mg/dL   Calcium 8.4 (L) 8.9 - 10.3 mg/dL   Total Protein 7.0 6.5 - 8.1 g/dL   Albumin 3.8 3.5 - 5.0 g/dL   AST 25 15 - 41 U/L   ALT 33 0 - 44 U/L   Alkaline Phosphatase 65 38 - 126 U/L   Total Bilirubin 0.6 0.3 - 1.2 mg/dL   GFR, Estimated 55 (L) >60 mL/min   Anion gap 5 5 - 15  Troponin I (High Sensitivity)     Status: Abnormal   Collection Time: 07/23/20  5:34 PM  Result Value Ref Range   Troponin I (High Sensitivity) 35 (H) <18 ng/L  Resp Panel by RT-PCR (Flu A&B, Covid) Nasopharyngeal Swab     Status: None   Collection Time: 07/23/20  7:36 PM   Specimen: Nasopharyngeal Swab; Nasopharyngeal(NP) swabs in vial transport medium  Result Value Ref Range   SARS Coronavirus 2 by RT PCR NEGATIVE NEGATIVE   Influenza A by PCR NEGATIVE NEGATIVE   Influenza B by PCR NEGATIVE NEGATIVE  Blood gas, venous     Status: Abnormal   Collection Time: 07/23/20  7:36 PM  Result Value Ref Range   pH, Ven 7.26 7.250 - 7.430   pCO2, Ven 93 (HH) 44.0 - 60.0 mmHg   pO2, Ven <31.0 (LL) 32.0 - 45.0 mmHg   Bicarbonate 41.7 (H) 20.0 - 28.0 mmol/L   Acid-Base Excess 9.8 (H) 0.0 - 2.0 mmol/L   O2 Saturation 46.7 %   Patient  temperature 37.0    Collection site VEIN    Sample type VENOUS   Troponin I (High Sensitivity)     Status: Abnormal   Collection Time: 07/23/20  7:36 PM  Result Value Ref Range   Troponin I (High Sensitivity) 34 (H) <18 ng/L   ____________________________________________  EKG My review and personal interpretation at Time: 17:39   Indication: malaise  Rate: 75  Rhythm: sinus Axis: left Other: nonspecific st and t wave abn, no stemi ____________________________________________  RADIOLOGY  I personally reviewed all radiographic images ordered to evaluate for the above acute complaints and reviewed radiology reports and findings.  These findings were personally discussed with the patient.  Please see medical record for radiology report.  ____________________________________________   PROCEDURES  Procedure(s) performed:  .Critical Care  Date/Time: 07/23/2020 8:45 PM Performed by: Merlyn Lot, MD Authorized by: Merlyn Lot, MD   Critical care provider statement:    Critical care time (minutes):  35   Critical care time was exclusive of:  Separately billable procedures and treating other patients   Critical care was necessary to treat or prevent imminent or life-threatening deterioration of the following conditions:  Respiratory failure   Critical care was time spent personally by me on the following activities:  Development of treatment plan with patient or surrogate, discussions with consultants, evaluation of patient's response to treatment, examination of patient, obtaining history from patient or surrogate, ordering and performing treatments and interventions, ordering and review of laboratory studies, ordering and review of radiographic studies, pulse oximetry, re-evaluation of patient's condition, review of old charts and ventilator management    Critical Care performed: yes ____________________________________________   INITIAL IMPRESSION / [place]  / ED COURSE  Pertinent labs & imaging results that were available during my care  of the patient were reviewed by me and considered in my medical decision making (see chart for details).   DDX: chf, pna, bronchitis, copd, pe, coivd, flu, dehydration, tension, migraine  JERY HOLLERN is a 50 y.o. who presents to the ED with agitation as described above.  Patient is ill-appearing but protecting his airway.  Does appear drowsy.  Given his come plaints blood work sent for the above differential.  Will add on VBG to concern for hypercapnic respiratory failure.  Chest x-ray appears stable.  No fevers.  Does not seem consistent with ACS.  Doubt CHF.  Possible COPD exacerbation.  Clinical Course as of 07/23/20 2045  Sat Jul 23, 2020  2028 Patient with evidence of hypercapnic respiratory failure which explain his headache increased sleepiness and dyspnea.  Discussed with respiratory as well as ICU will figure out solution to increase ventilation by placing on ventilator.  He is mentating appropriately.  ICU team has kindly agreed to admit to manage patient. [PR]    Clinical Course User Index [PR] Merlyn Lot, MD    The patient was evaluated in Emergency Department today for the symptoms described in the history of present illness. He/she was evaluated in the context of the global COVID-19 pandemic, which necessitated consideration that the patient might be at risk for infection with the SARS-CoV-2 virus that causes COVID-19. Institutional protocols and algorithms that pertain to the evaluation of patients at risk for COVID-19 are in a state of rapid change based on information released by regulatory bodies including the CDC and federal and state organizations. These policies and algorithms were followed during the patient's care in the ED.  As part of my medical decision making, I reviewed the following data within the [facility] notes reviewed and incorporated, Labs  reviewed, notes from prior ED visits and [place] Sub[place] Database   ____________________________________________   FINAL CLINICAL IMPRESSION(S) / ED DIAGNOSES  Final diagnoses:  Acute hypercapnic respiratory failure ([place])      NEW MEDICATIONS STARTED DURING THIS VISIT:  New Prescriptions   No medications on file     Note:  This document was prepared using Dragon voice recognition software and may include unintentional dictation errors.    Merlyn Lot, MD 07/23/20 2045

## 2020-07-23 NOTE — ED Notes (Signed)
Patient transported to xr

## 2020-07-23 NOTE — ED Triage Notes (Signed)
Pt c/o lightheadedness, dizziness, sob, and headache X2 days. Denies fever or chills. Trach present. Wears 2L O2 continuously.

## 2020-07-23 NOTE — Progress Notes (Signed)
Patient states has been sleepy with headache throughout the day.  VBG with critical results. Per Dr Quentin Cornwall, trach changed and placed patient on vent VT 500/18/30/5.  Patient with 6.5 flexi trach cuffless. Changed to cuffed. Bugie catheter used to assist with changing trach. Verified placement with co2 detector.  Positive color change noted. BBS noted. Trach suctioned for small amount of clear the white thick secretions. Saturation noted at 96 on vent.  Updated Dr Quentin Cornwall of outcome and vent settings. Will continue to monitor.

## 2020-07-23 NOTE — ED Notes (Signed)
MD and rest bedside, pt aware admission based on vbg and getting placed on vent vs bipap with trach

## 2020-07-23 NOTE — H&P (Signed)
NAME:  [name], MRN:  [id number], DOB:  03/29/1970, LOS: 1 ADMISSION DATE:  07/23/2020, CONSULTATION DATE:  07/23/2020 REFERRING MD:  Dr. Quentin Cornwall, CHIEF COMPLAINT:  Shortness of breath and headache  History of Present Illness:  50 year old male presenting to [facility] ED with complaints of generalized malaise, congestion, lightheadedness, headache and exertional dyspnea for the past several days.   ED course:  Patient denies fevers, increased edema/pain with deep inspiration.  He also confirms being compliant on his Eliquis.  Overall he reports feeling very fatigued and states he has not been sleeping well for the past five days.  No known sick contacts, no recent antibiotics, no recent steroids.  Patient reports wearing chronic 2 L nasal cannula during the day then capping his trach using BiPAP overnight. Patient admitted to not having the appropriate caps when his tracheostomy was recently changed. He explained that he had to resort to taping his trach at night while on his BIPAP mask. He is unsure how much leakage during the night might have occurred. Patient received decadron- 10 mg, duoneb x 1 and reglan. Initial vitals: Afebrile at 98.3, mildly tachypneic at 22, HR 73, BP stable at 134/96, SPO2 98% Significant labs: COVID-19 & influenza a/B negative, serum CO2 33, creatinine 1.53, troponin 35> 34, CBC WNL, VBG shows respiratory acidosis with some compensation: 7.26/93/<31/41.7.  Chest x-ray shows bilateral scarring as well as what appears to be loculated effusion posteriorly on the left (stable in appearance from 12/2019)  Due to the patient's symptomatic hypercapnia and this facility's inability to place the patient on BiPAP in a similar fashion to what he does at home, decision was made to place him on ventilator support overnight.  PCCM consulted to admit to ICU as the patient will be requiring ventilator assistance.  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  Interim History / Subjective:  Patient drowsy but responsive, he c/o progressive SOB over the past 5 days with difficulty sleeping. He denies fevers/chills, nausea/vomiting/ productive cough. He denies chest pain. He also confirms to taking all his regular medication for the day prior to arriving at the ED. Spoke with wife, [name] who confirmed everything the patient reported. Objective   Blood pressure (!) 129/93, pulse 71, temperature 98.3 F (36.8 C), temperature source Oral, resp. rate 14, height 6\' 1"  (1.854 m), weight 124.7 kg, SpO2 100 %.    Vent Mode: AC FiO2 (%):  [30 %-40 %] 40 % Set Rate:  [18 bmp] 18 bmp Vt Set:  [500 mL] 500 mL PEEP:  [5 cmH20] 5 cmH20  No intake or output data in the 24 hours ending 07/24/20 0236 Filed Weights   07/23/20 1729  Weight: 124.7 kg    Examination: General: Adult male, ill-appearing, lying in bed with chronic tracheostomy requiring mechanical ventilation, NAD HEENT: MM pink/moist, anicteric, atraumatic, neck supple Neuro: A&O x 4, able to follow commands, PERRL +3, MAE CV: s1s2 RRR, NSR with RBBB on monitor, no r/m/g Pulm: Regular, non labored on AC  , breath sounds rhonchi-BUL &  diminished-BLL GI: soft, rounded, non tender, bs x 4 Skin: limited exam- patient in street clothes- no rashes/lesions noted Extremities: warm/dry, pulses + 2 R/P, +1 edema noted BLE  Labs/imaging that I have personally reviewed  (right  click and "Reselect all SmartList Selections" daily)  EKG Interpretation Date: 07/23/2020 EKG Time: 17: 39 Rate: 74 Rhythm: NSR with RBBB QRS Axis:  possible LAD Intervals: bifascicular block* unchanged from EKG 11/21 ST/T Wave abnormalities: unchanged T wave inversions in inferior leads Narrative Interpretation: Sinus Rhythm with RBBB Na+/ K+: 139/ 4.4 BUN/Cr.: 20/1.53 Serum CO2/ AG: 20/5  Hgb: 14.3 Troponin: 35 > 34 BNP: pending  WBC/ TMAX: 5.7/ 36.8  VBG: 7.26/ 93/ <31.0/ 41.7 CXR 07/23/20: Bilateral scarring and what appears to be a loculated effusion posteriorly on the left which is stable in appearance from November 2021 [facility] Problem list     Assessment & Plan:  Acute on chronic Hypercapnic Respiratory Failure in the setting of suspected faulty home equipment PMHx: COPD, OSA, former smoker Patient reports having his tracheostomy changed out for a newer version and since that time the caps he places on the trach at night when using his BiPAP mask have not fit and he has been utilizing tape. Plan to take patient off of vent and back on chronic 2 L [place] in AM - Ventilator settings: PRVC 8 mL/kg, 30% FiO2, 5 PEEP, continue ventilator support & lung protective strategies - Wean PEEP & FiO2 as tolerated, maintain SpO2 > 90% - Head of bed elevated 30 degrees, VAP protocol in place - Plateau pressures less than 30 cm H20  - Intermittent chest x-ray & ABG PRN - Ensure adequate pulmonary hygiene  - Steroids initiated: solu-medrol 40 mg daily - home regimen reinstated: Theophylline, Dulera inhaler, Spiriva daily, bronchodilators PRN  Chronic HFpEF Essential HTN - continue home regimen: metoprolol, lisinopril & furosemide - continue home K+ replacement daily  Paroxysmal Atrial Fibrillation  CHA2DS2-VASc score: 3 -continue home Eliquis which patient reports taking faithfully - continue home BB - Continuous cardiac monitoring  Hyperlipidemia - continue home  atorvastatin  CKD Stage 3a Creatinine at recent baseline 1.4-1.5 - Strict I/O's: alert provider if UOP < 0.5 mL/kg/hr - Daily BMP, replace electrolytes PRN - Avoid nephrotoxic agents as able, ensure adequate renal perfusion> restarted home antihypertensives, if renal function worsens consider holding lisinopril/ furosemide  Type 2 Diabetes Mellitus - Monitor CBG Q 4 AC, HS - SSI resistant dosing, consider Lantus if needed - target range while in ICU: 140-180 - follow ICU hyper/hypo-glycemia protocol  Best Practice (right click and "Reselect all SmartList Selections" daily)  Diet/type: NPO Pain/Anxiety/Delirium protocol Not indicated VAP protocol (if indicated): Yes DVT prophylaxis: DOAC GI prophylaxis: N/A Glucose control:  SSI Central venous access:  N/A Arterial line:  N/A Foley:  N/A Mobility:  OOB  PT consulted: N/A Studies pending: None Culture data pending:none Last reviewed culture data: N/A Antibiotics:not indicated  Antibiotic de-escalation: N/A Stop date: N/A Daily labs: ordered Code Status:  full code Last date of multidisciplinary goals of care discussion 07/23/20 ccm prognosis: stable  Disposition: Continue current level of care   Labs   CBC: Recent Labs  Lab 07/23/20 1734  WBC 5.7  NEUTROABS 3.5  HGB 14.3  HCT 43.9  MCV 86.6  PLT 662    Basic Metabolic Panel: Recent Labs  Lab 07/23/20 1734  NA 139  K 4.4  CL 101  CO2 33*  GLUCOSE 134*  BUN 20  CREATININE 1.53*  CALCIUM 8.4*  GFR: Estimated Creatinine Clearance: 79.9 mL/min (A) (by C-G formula based on SCr of 1.53 mg/dL (H)). Recent Labs  Lab 07/23/20 1734  WBC 5.7    Liver Function Tests: Recent Labs  Lab 07/23/20 1734  AST 25  ALT 33  ALKPHOS 65  BILITOT 0.6  PROT 7.0  ALBUMIN 3.8   No results for input(s): LIPASE, AMYLASE in the last 168 hours. No results for input(s): AMMONIA in the last 168 hours.  ABG    Component Value Date/Time   PHART 7.40 07/23/2020 2300    PCO2ART 60 (H) 07/23/2020 2300   PO2ART 70 (L) 07/23/2020 2300   HCO3 37.2 (H) 07/23/2020 2300   O2SAT 93.8 07/23/2020 2300     Coagulation Profile: No results for input(s): INR, PROTIME in the last 168 hours.  Cardiac Enzymes: No results for input(s): CKTOTAL, CKMB, CKMBINDEX, TROPONINI in the last 168 hours.  HbA1C: Hemoglobin A1C  Date/Time Value Ref Range Status  02/24/2013 04:54 AM 7.8 (H) 4.2 - 6.3 % Final    Comment:    The American Diabetes Association recommends that a primary goal of therapy should be <7% and that physicians should reevaluate the treatment regimen in patients with HbA1c values consistently >8%.    Hgb A1c MFr Bld  Date/Time Value Ref Range Status  01/28/2020 01:10 AM 6.6 (H) 4.8 - 5.6 % Final    Comment:    (NOTE) Pre diabetes:          5.7%-6.4%  Diabetes:              >6.4%  Glycemic control for   <7.0% adults with diabetes   01/02/2020 04:21 AM 6.5 (H) 4.8 - 5.6 % Final    Comment:    (NOTE) Pre diabetes:          5.7%-6.4%  Diabetes:              >6.4%  Glycemic control for   <7.0% adults with diabetes     CBG: Recent Labs  Lab 07/24/20 0045  GLUCAP 138*    Review of Systems: Positives in BOLD  Gen: Denies fever, chills, weight change, fatigue, night sweats HEENT: Denies blurred vision, double vision, hearing loss, tinnitus, sinus congestion, rhinorrhea, sore throat, neck stiffness, dysphagia PULM: Denies shortness of breath, cough, sputum production, hemoptysis, wheezing CV: Denies chest pain, edema, orthopnea, paroxysmal nocturnal dyspnea, palpitations GI: Denies abdominal pain, nausea, vomiting, diarrhea, hematochezia, melena, constipation, change in bowel habits GU: Denies dysuria, hematuria, polyuria, oliguria, urethral discharge Endocrine: Denies hot or cold intolerance, polyuria, polyphagia or appetite change Derm: Denies rash, dry skin, scaling or peeling skin change Heme: Denies easy bruising, bleeding, bleeding  gums Neuro: Denies headache, numbness, weakness, slurred speech, loss of memory or consciousness  Past Medical History:  He,  has a past medical history of Acute on chronic respiratory failure with hypoxia (HCC), CHF (congestive heart failure) ([place]), CKD (chronic kidney disease) stage 3, GFR 30-59 ml/min (HCC), Clear cell renal cell carcinoma (HCC), COPD (chronic obstructive pulmonary disease) ([place]), COPD, severe ([place]), Coronary artery disease, Diabetes mellitus without complication ([place]), Hypercholesteremia ([place]), Hypertension, Myocardial infarction ([place]), Obstructive sleep apnea, Paroxysmal A-fib ([place]), Sarcoidosis, Sarcoidosis, lung ([place]), Sleep apnea, Stroke ([place]), and Tracheostomy status ([place]).   Surgical History:   Past Surgical History:  Procedure Laterality Date   APPENDECTOMY     PARTIAL NEPHRECTOMY     TRACHEOSTOMY TUBE PLACEMENT N/A 01/14/2020   Procedure: TRACHEOSTOMY;  Surgeon: Clyde Canterbury, MD;  Location: [facility] ORS;  Service: ENT;  Laterality: N/A;     Social History:   reports that he has been smoking cigarettes. He has never used smokeless tobacco. He reports current alcohol use. He reports current drug use. Drugs: Marijuana and PCP.   Family History:  His family history includes Cancer in his father; Diabetes in his mother; Heart disease in his mother; Hypertension in his mother.   Allergies Allergies  Allergen Reactions   Penicillins Anaphylaxis and Other (See Comments)    Has patient had a PCN reaction causing immediate rash, facial/tongue/throat swelling, SOB or lightheadedness with hypotension: Yes Has patient had a PCN reaction causing severe rash involving mucus membranes or skin necrosis: No Has patient had a PCN reaction that required hospitalization: No Has patient had a PCN reaction occurring within the last 10 years: No If all of the above answers are "NO", then may proceed with Cephalosporin use.      Home Medications  Prior to Admission medications    Not on File     Critical care time: 47 minutes       Venetia Night, AGACNP-BC Acute Care Nurse Practitioner [place]   [phone] / [phone] Please see Amion for pager details.

## 2020-07-24 ENCOUNTER — Encounter: Payer: Self-pay | Admitting: Pulmonary Disease

## 2020-07-24 DIAGNOSIS — I480 Paroxysmal atrial fibrillation: Secondary | ICD-10-CM

## 2020-07-24 LAB — CBG MONITORING, ED
Glucose-Capillary: 134 mg/dL — ABNORMAL HIGH (ref 70–99)
Glucose-Capillary: 138 mg/dL — ABNORMAL HIGH (ref 70–99)
Glucose-Capillary: 174 mg/dL — ABNORMAL HIGH (ref 70–99)

## 2020-07-24 LAB — BASIC METABOLIC PANEL
Anion gap: 6 (ref 5–15)
BUN: 25 mg/dL — ABNORMAL HIGH (ref 6–20)
CO2: 33 mmol/L — ABNORMAL HIGH (ref 22–32)
Calcium: 8.5 mg/dL — ABNORMAL LOW (ref 8.9–10.3)
Chloride: 104 mmol/L (ref 98–111)
Creatinine, Ser: 1.65 mg/dL — ABNORMAL HIGH (ref 0.61–1.24)
GFR, Estimated: 50 mL/min — ABNORMAL LOW (ref >60)
Glucose, Bld: 137 mg/dL — ABNORMAL HIGH (ref 70–99)
Potassium: 4.4 mmol/L (ref 3.5–5.1)
Sodium: 143 mmol/L (ref 135–145)

## 2020-07-24 LAB — GLUCOSE, CAPILLARY
Glucose-Capillary: 174 mg/dL — ABNORMAL HIGH (ref 70–99)
Glucose-Capillary: 162 mg/dL — ABNORMAL HIGH (ref 70–99)

## 2020-07-24 LAB — CBC
HCT: 44.2 % (ref 39.0–52.0)
Hemoglobin: 14 g/dL (ref 13.0–17.0)
MCH: 27.6 pg (ref 26.0–34.0)
MCHC: 31.7 g/dL (ref 30.0–36.0)
MCV: 87 fL (ref 80.0–100.0)
Platelets: 217 10*3/uL (ref 150–400)
RBC: 5.08 MIL/uL (ref 4.22–5.81)
RDW: 13.2 % (ref 11.5–15.5)
WBC: 8.5 10*3/uL (ref 4.0–10.5)
nRBC: 0 % (ref 0.0–0.2)

## 2020-07-24 LAB — MAGNESIUM: Magnesium: 2 mg/dL (ref 1.7–2.4)

## 2020-07-24 LAB — PHOSPHORUS: Phosphorus: 3.9 mg/dL (ref 2.5–4.6)

## 2020-07-24 LAB — BRAIN NATRIURETIC PEPTIDE: B Natriuretic Peptide: 218.9 pg/mL — ABNORMAL HIGH (ref 0.0–100.0)

## 2020-07-24 MED ORDER — LISINOPRIL 5 MG PO TABS
30.0000 mg | ORAL_TABLET | Freq: Every day | ORAL | Status: DC
  Administered 2020-07-24 – 2020-07-25 (×2): 30 mg via ORAL
  Filled 2020-07-24: qty 1
  Filled 2020-07-24: qty 3
  Filled 2020-07-24: qty 2

## 2020-07-24 MED ORDER — FUROSEMIDE 20 MG PO TABS
80.0000 mg | ORAL_TABLET | Freq: Every day | ORAL | Status: DC
  Administered 2020-07-24 – 2020-07-26 (×2): 80 mg via ORAL
  Filled 2020-07-24: qty 4
  Filled 2020-07-24: qty 2

## 2020-07-24 MED ORDER — METOPROLOL TARTRATE 50 MG PO TABS
100.0000 mg | ORAL_TABLET | Freq: Two times a day (BID) | ORAL | Status: DC
  Administered 2020-07-24 – 2020-07-26 (×5): 100 mg via ORAL
  Filled 2020-07-24 (×6): qty 2

## 2020-07-24 MED ORDER — MELATONIN 5 MG PO TABS
5.0000 mg | ORAL_TABLET | Freq: Every day | ORAL | Status: DC
  Administered 2020-07-24 – 2020-07-25 (×2): 5 mg via ORAL
  Filled 2020-07-24 (×2): qty 1

## 2020-07-24 MED ORDER — THEOPHYLLINE ER 300 MG PO TB12
300.0000 mg | ORAL_TABLET | Freq: Two times a day (BID) | ORAL | Status: DC
  Administered 2020-07-24 – 2020-07-26 (×5): 300 mg via ORAL
  Filled 2020-07-24 (×8): qty 1

## 2020-07-24 MED ORDER — TIOTROPIUM BROMIDE MONOHYDRATE 18 MCG IN CAPS
18.0000 ug | ORAL_CAPSULE | Freq: Every day | RESPIRATORY_TRACT | Status: DC
  Administered 2020-07-24 – 2020-07-26 (×3): 18 ug via RESPIRATORY_TRACT
  Filled 2020-07-24: qty 5

## 2020-07-24 NOTE — Progress Notes (Signed)
Patient on vent resting comfortably. O2 increased to 40% as patient has brief desats to upper 80's but returns to low 90's quickly. Will continue to monitor

## 2020-07-24 NOTE — ED Notes (Signed)
Pt ambulatory to the restroom at this time.  

## 2020-07-24 NOTE — ED Notes (Signed)
Pt indicated that his trach needed suction. Pt oxygenated and tube suctioned providing relief to patient. Pt tolerated well.

## 2020-07-24 NOTE — Progress Notes (Signed)
O2 decreased to 35% on vent. Patient tolerated interventions well. O2 sat noted 96%

## 2020-07-24 NOTE — Progress Notes (Signed)
Pharmacy Electrolyte Monitoring Consult:  Pharmacy consulted to assist in monitoring and replacing electrolytes in this 50 y.o. male admitted on 07/23/2020 with Shortness of Breath and Headache   Labs:  Sodium (mmol/L)  Date Value  07/24/2020 143  05/28/2014 140   Potassium (mmol/L)  Date Value  07/24/2020 4.4  05/28/2014 3.9   Magnesium (mg/dL)  Date Value  07/24/2020 2.0  05/14/2013 1.8   Phosphorus (mg/dL)  Date Value  07/24/2020 3.9  03/09/2013 3.0   Calcium (mg/dL)  Date Value  07/24/2020 8.5 (L)   Calcium, Total (mg/dL)  Date Value  05/28/2014 8.4 (L)   Albumin (g/dL)  Date Value  07/23/2020 3.8  06/01/2013 3.8    Assessment/Plan: 6/19  K 4.4  Mag 2.0  Phos 3.9  Scr 1.65 Pt on lasix 80 mg po daily and KCL 20 meq PO daily -no replacement at this time -f/u with am labs  Maurica Omura A 07/24/2020 2:15 PM

## 2020-07-24 NOTE — ED Notes (Signed)
Updated pt's wife Darin Arndt [phone]) [phone]  at this time

## 2020-07-24 NOTE — Progress Notes (Signed)
Patient with bipap order and RT understanding to plug trach at bedtime.  Plan was to transition patient back to his way of sleeping. However, with new shiley flex trach, having the ability to plug trach is why patient ended up in er.  He had his trach changed, received new flexi trach, and used speaking valve with tape over it to plug his trach so that he could use his bipap mask at night.  [facility] sent wrong plug for new trach. Hospital does not have correct item either. Utilizing something for time being was not adequate or safe for patient. [name] is sending something by courier tonight but I am not sure what such may be or when it will arrive. Patient waiting without proper ventilation at night was not answer either.  I was not able to locate any setting information for what his trilogy home settings could be. I questioned if patient's unit is possibly in AVAPS mode since most trilogy units are. Standard bipap settings may not be adequate for this patient so I felt that was something that needs to be addressed with pulmonary MD and RT managers.  After much conversation with NP and RT managers, I felt it was best for patient to be placed back on vent for overnight while making sure proper equipment is here to plug his trach and proper settings for V60 can be addressed.  I expressed my safety concerns with [name].  He understood as long as unit care was not reason to delay transitioning home.

## 2020-07-24 NOTE — ED Notes (Signed)
Dr. Lanney Gins, MD at bedside at this time.

## 2020-07-24 NOTE — ED Notes (Signed)
Patient suctioned via inline suction and oral suction. Respiratory notified that patient needs to be seen by them. Patient states he feels like he cannot breath well. Vitals updated. Denies any further needs at this time. Does not want medications until respiratory checks on him per pt.

## 2020-07-24 NOTE — Progress Notes (Signed)
NAME:  [name], MRN:  [id number], DOB:  April 03, 1970, LOS: 1 ADMISSION DATE:  07/23/2020, CONSULTATION DATE:  07/23/2020 REFERRING MD:  Dr. Quentin Cornwall, CHIEF COMPLAINT:  Shortness of breath and headache  History of Present Illness:  50 year old male presenting to [facility] ED with complaints of generalized malaise, congestion, lightheadedness, headache and exertional dyspnea for the past several days.   ED course:  Patient denies fevers, increased edema/pain with deep inspiration.  He also confirms being compliant on his Eliquis.  Overall he reports feeling very fatigued and states he has not been sleeping well for the past five days.  No known sick contacts, no recent antibiotics, no recent steroids.  Patient reports wearing chronic 2 L nasal cannula during the day then capping his trach using BiPAP overnight. Patient admitted to not having the appropriate caps when his tracheostomy was recently changed. He explained that he had to resort to taping his trach at night while on his BIPAP mask. He is unsure how much leakage during the night might have occurred. Patient received decadron- 10 mg, duoneb x 1 and reglan. Initial vitals: Afebrile at 98.3, mildly tachypneic at 22, HR 73, BP stable at 134/96, SPO2 98% Significant labs: COVID-19 & influenza a/B negative, serum CO2 33, creatinine 1.53, troponin 35> 34, CBC WNL, VBG shows respiratory acidosis with some compensation: 7.26/93/<31/41.7.  Chest x-ray shows bilateral scarring as well as what appears to be loculated effusion posteriorly on the left (stable in appearance from 12/2019)  Due to the patient's symptomatic hypercapnia and this facility's inability to place the patient on BiPAP in a similar fashion to what he does at home, decision was made to place him on ventilator support overnight.  PCCM consulted to admit to ICU as the patient will be requiring ventilator assistance.  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 07/24/20- patient is improved, he is speaking fully in complete sentences with 11L/min via mask over vent.   Interim History / Subjective:  Patient drowsy but responsive, he c/o progressive SOB over the past 5 days with difficulty sleeping. He denies fevers/chills, nausea/vomiting/ productive cough. He denies chest pain. He also confirms to taking all his regular medication for the day prior to arriving at the ED. Spoke with wife, [name] who confirmed everything the patient reported. Objective   Blood pressure 107/89, pulse 76, temperature 98.3 F (36.8 C), temperature source Oral, resp. rate 19, height 6\' 1"  (1.854 m), weight 124.7 kg, SpO2 94 %.    Vent Mode: AC FiO2 (%):  [30 %-40 %] 40 % Set Rate:  [18 bmp] 18 bmp Vt Set:  [500 mL] 500 mL PEEP:  [5 cmH20] 5 cmH20  No intake or output data in the 24 hours ending 07/24/20 1146 Filed Weights   07/23/20 1729  Weight: 124.7 kg    Examination: General: Adult male, ill-appearing, lying in bed with chronic tracheostomy requiring mechanical ventilation, NAD HEENT: MM pink/moist, anicteric, atraumatic, neck supple Neuro: A&O x 4, able to follow commands, PERRL +3, MAE CV: s1s2 RRR, NSR  with RBBB on monitor, no r/m/g Pulm: Regular, non labored on AC  , breath sounds rhonchi-BUL & diminished-BLL GI: soft, rounded, non tender, bs x 4 Skin: limited exam- patient in street clothes- no rashes/lesions  noted Extremities: warm/dry, pulses + 2 R/P, +1 edema noted BLE  Labs/imaging that I have personally reviewed  (right click and "Reselect all SmartList Selections" daily)  EKG Interpretation Date: 07/23/2020 EKG Time: 17: 39 Rate: 74 Rhythm: NSR with RBBB QRS Axis:  possible LAD Intervals: bifascicular block* unchanged from EKG 11/21 ST/T Wave abnormalities: unchanged T wave inversions in inferior leads Narrative Interpretation: Sinus Rhythm with RBBB Na+/ K+: 139/ 4.4 BUN/Cr.: 20/1.53 Serum CO2/ AG: 20/5  Hgb: 14.3 Troponin: 35 > 34 BNP: pending  WBC/ TMAX: 5.7/ 36.8  VBG: 7.26/ 93/ <31.0/ 41.7 CXR 07/23/20: Bilateral scarring and what appears to be a loculated effusion posteriorly on the left which is stable in appearance from November 2021 [facility] Problem list     Assessment & Plan:  Acute on chronic Hypercapnic Respiratory Failure in the setting of suspected faulty home equipment PMHx: COPD, OSA, former smoker Patient reports having his tracheostomy changed out for a newer version and since that time the caps he places on the trach at night when using his BiPAP mask have not fit and he has been utilizing tape. Plan to take patient off of vent and back on chronic 2 L [place] in AM - Ventilator settings: PRVC 8 mL/kg, 30% FiO2, 5 PEEP, continue ventilator support & lung protective strategies - Wean PEEP & FiO2 as tolerated, maintain SpO2 > 90% - Head of bed elevated 30 degrees, VAP protocol in place - Plateau pressures less than 30 cm H20  - Intermittent chest x-ray & ABG PRN - Ensure adequate pulmonary hygiene  - Steroids initiated: solu-medrol 40 mg daily - home regimen reinstated: Theophylline, Dulera inhaler, Spiriva daily, bronchodilators PRN  Chronic HFpEF Essential HTN - continue home regimen: metoprolol, lisinopril & furosemide - continue home K+ replacement daily  Paroxysmal Atrial Fibrillation  CHA2DS2-VASc score: 3 -continue home Eliquis which patient  reports taking faithfully - continue home BB - Continuous cardiac monitoring  Hyperlipidemia - continue home atorvastatin  CKD Stage 3a Creatinine at recent baseline 1.4-1.5 - Strict I/O's: alert provider if UOP < 0.5 mL/kg/hr - Daily BMP, replace electrolytes PRN - Avoid nephrotoxic agents as able, ensure adequate renal perfusion> restarted home antihypertensives, if renal function worsens consider holding lisinopril/ furosemide  Type 2 Diabetes Mellitus - Monitor CBG Q 4 AC, HS - SSI resistant dosing, consider Lantus if needed - target range while in ICU: 140-180 - follow ICU hyper/hypo-glycemia protocol  Best Practice (right click and "Reselect all SmartList Selections" daily)  Diet/type: NPO Pain/Anxiety/Delirium protocol Not indicated VAP protocol (if indicated): Yes DVT prophylaxis: DOAC GI prophylaxis: N/A Glucose control:  SSI Central venous access:  N/A Arterial line:  N/A Foley:  N/A Mobility:  OOB  PT consulted: N/A Studies pending: None Culture data pending:none Last reviewed culture data: protect  N/A Antibiotics:not indicated  Antibiotic de-escalation: N/A Stop date: N/A Daily labs: ordered Code Status:  full code Last date of multidisciplinary goals of care discussion 07/23/20 ccm prognosis: stable  Disposition: Continue current level of care   Labs   CBC: Recent Labs  Lab 07/23/20 1734 07/24/20 0846  WBC 5.7 8.5  NEUTROABS 3.5  --   HGB 14.3 14.0  HCT 43.9 44.2  MCV 86.6 87.0  PLT 220 217     Basic Metabolic  Panel: Recent Labs  Lab 07/23/20 1734 07/24/20 0846  NA 139 143  K 4.4 4.4  CL 101 104  CO2 33* 33*  GLUCOSE 134* 137*  BUN 20 25*  CREATININE 1.53* 1.65*  CALCIUM 8.4* 8.5*  MG  --  2.0  PHOS  --  3.9    GFR: Estimated Creatinine Clearance: 74.1 mL/min (A) (by C-G formula based on SCr of 1.65 mg/dL (H)). Recent Labs  Lab 07/23/20 1734 07/24/20 0846  WBC 5.7 8.5     Liver Function Tests: Recent Labs  Lab  07/23/20 1734  AST 25  ALT 33  ALKPHOS 65  BILITOT 0.6  PROT 7.0  ALBUMIN 3.8    No results for input(s): LIPASE, AMYLASE in the last 168 hours. No results for input(s): AMMONIA in the last 168 hours.  ABG    Component Value Date/Time   PHART 7.40 07/23/2020 2300   PCO2ART 60 (H) 07/23/2020 2300   PO2ART 70 (L) 07/23/2020 2300   HCO3 37.2 (H) 07/23/2020 2300   O2SAT 93.8 07/23/2020 2300      Coagulation Profile: No results for input(s): INR, PROTIME in the last 168 hours.  Cardiac Enzymes: No results for input(s): CKTOTAL, CKMB, CKMBINDEX, TROPONINI in the last 168 hours.  HbA1C: Hemoglobin A1C  Date/Time Value Ref Range Status  02/24/2013 04:54 AM 7.8 (H) 4.2 - 6.3 % Final    Comment:    The American Diabetes Association recommends that a primary goal of therapy should be <7% and that physicians should reevaluate the treatment regimen in patients with HbA1c values consistently >8%.    Hgb A1c MFr Bld  Date/Time Value Ref Range Status  01/28/2020 01:10 AM 6.6 (H) 4.8 - 5.6 % Final    Comment:    (NOTE) Pre diabetes:          5.7%-6.4%  Diabetes:              >6.4%  Glycemic control for   <7.0% adults with diabetes   01/02/2020 04:21 AM 6.5 (H) 4.8 - 5.6 % Final    Comment:    (NOTE) Pre diabetes:          5.7%-6.4%  Diabetes:              >6.4%  Glycemic control for   <7.0% adults with diabetes     CBG: Recent Labs  Lab 07/24/20 0045 07/24/20 0842  GLUCAP 138* 134*     Review of Systems: Positives in BOLD  Gen: Denies fever, chills, weight change, fatigue, night sweats HEENT: Denies blurred vision, double vision, hearing loss, tinnitus, sinus congestion, rhinorrhea, sore throat, neck stiffness, dysphagia PULM: Denies shortness of breath, cough, sputum production, hemoptysis, wheezing CV: Denies chest pain, edema, orthopnea, paroxysmal nocturnal dyspnea, palpitations GI: Denies abdominal pain, nausea, vomiting, diarrhea, hematochezia,  melena, constipation, change in bowel habits GU: Denies dysuria, hematuria, polyuria, oliguria, urethral discharge Endocrine: Denies hot or cold intolerance, polyuria, polyphagia or appetite change Derm: Denies rash, dry skin, scaling or peeling skin change Heme: Denies easy bruising, bleeding, bleeding gums Neuro: Denies headache, numbness, weakness, slurred speech, loss of memory or consciousness  Past Medical History:  He,  has a past medical history of Acute on chronic respiratory failure with hypoxia (HCC), CHF (congestive heart failure) (HCC), CKD (chronic kidney disease) stage 3, GFR 30-59 ml/min (HCC), Clear cell renal cell carcinoma (HCC), COPD (chronic obstructive pulmonary disease) ([place]), COPD, severe ([place]), Coronary artery disease, Diabetes mellitus without complication ([place]), Hypercholesteremia ([place]), Hypertension,  Myocardial infarction [facility]), Obstructive sleep apnea, Paroxysmal A-fib ([place]), Sarcoidosis, Sarcoidosis, lung ([place]), Sleep apnea, Stroke ([place]), and Tracheostomy status ([place]).   Surgical History:   Past Surgical History:  Procedure Laterality Date   APPENDECTOMY     PARTIAL NEPHRECTOMY     TRACHEOSTOMY TUBE PLACEMENT N/A 01/14/2020   Procedure: TRACHEOSTOMY;  Surgeon: Clyde Canterbury, MD;  Location: [facility] ORS;  Service: ENT;  Laterality: N/A;     Social History:   reports that he has been smoking cigarettes. He has never used smokeless tobacco. He reports current alcohol use. He reports current drug use. Drugs: Marijuana and PCP.   Family History:  His family history includes Cancer in his father; Diabetes in his mother; Heart disease in his mother; Hypertension in his mother.   Allergies Allergies  Allergen Reactions   Penicillins Anaphylaxis and Other (See Comments)    Has patient had a PCN reaction causing immediate rash, facial/tongue/throat swelling, SOB or lightheadedness with hypotension: Yes Has patient had a PCN reaction causing severe rash involving mucus  membranes or skin necrosis: No Has patient had a PCN reaction that required hospitalization: No Has patient had a PCN reaction occurring within the last 10 years: No If all of the above answers are "NO", then may proceed with Cephalosporin use.      Home Medications  Prior to Admission medications   Not on File     Critical care provider statement:    Critical care time (minutes):  33   Critical care time was exclusive of:  Separately billable procedures and  treating other patients   Critical care was necessary to treat or prevent imminent or  life-threatening deterioration of the following conditions:  acute on chronic hypoxemic respiratory failure, altered mental status with encephalopathy, trache status    Critical care was time spent personally by me on the following  activities:  Development of treatment plan with patient or surrogate,  discussions with consultants, evaluation of patient's response to  treatment, examination of patient, obtaining history from patient or  surrogate, ordering and performing treatments and interventions, ordering  and review of laboratory studies and re-evaluation of patient's condition   I assumed direction of critical care for this patient from another  provider in my specialty: no      Ottie Glazier, M.D.  Pulmonary & [place]

## 2020-07-24 NOTE — ED Notes (Signed)
Pt placed in the hospital bed at this time. Pt repositioned and given water and apple juice per request.

## 2020-07-25 DIAGNOSIS — J9622 Acute and chronic respiratory failure with hypercapnia: Principal | ICD-10-CM

## 2020-07-25 LAB — GLUCOSE, CAPILLARY
Glucose-Capillary: 172 mg/dL — ABNORMAL HIGH (ref 70–99)
Glucose-Capillary: 106 mg/dL — ABNORMAL HIGH (ref 70–99)
Glucose-Capillary: 165 mg/dL — ABNORMAL HIGH (ref 70–99)
Glucose-Capillary: 185 mg/dL — ABNORMAL HIGH (ref 70–99)
Glucose-Capillary: 198 mg/dL — ABNORMAL HIGH (ref 70–99)
Glucose-Capillary: 149 mg/dL — ABNORMAL HIGH (ref 70–99)

## 2020-07-25 LAB — BLOOD GAS, ARTERIAL
Acid-Base Excess: 10 mmol/L — ABNORMAL HIGH (ref 0.0–2.0)
Bicarbonate: 38.4 mmol/L — ABNORMAL HIGH (ref 20.0–28.0)
FIO2: 0.4
O2 Saturation: 93.1 %
Patient temperature: 37
pCO2 arterial: 68 mmHg (ref 32.0–48.0)
pH, Arterial: 7.36 (ref 7.350–7.450)
pO2, Arterial: 70 mmHg — ABNORMAL LOW (ref 83.0–108.0)

## 2020-07-25 LAB — CBC
HCT: 40 % (ref 39.0–52.0)
Hemoglobin: 12.9 g/dL — ABNORMAL LOW (ref 13.0–17.0)
MCH: 27.7 pg (ref 26.0–34.0)
MCHC: 32.3 g/dL (ref 30.0–36.0)
MCV: 86 fL (ref 80.0–100.0)
Platelets: 212 10*3/uL (ref 150–400)
RBC: 4.65 MIL/uL (ref 4.22–5.81)
RDW: 13.4 % (ref 11.5–15.5)
WBC: 11.1 10*3/uL — ABNORMAL HIGH (ref 4.0–10.5)
nRBC: 0 % (ref 0.0–0.2)

## 2020-07-25 LAB — BASIC METABOLIC PANEL
Anion gap: 6 (ref 5–15)
BUN: 30 mg/dL — ABNORMAL HIGH (ref 6–20)
CO2: 32 mmol/L (ref 22–32)
Calcium: 8.2 mg/dL — ABNORMAL LOW (ref 8.9–10.3)
Chloride: 104 mmol/L (ref 98–111)
Creatinine, Ser: 1.71 mg/dL — ABNORMAL HIGH (ref 0.61–1.24)
GFR, Estimated: 48 mL/min — ABNORMAL LOW (ref >60)
Glucose, Bld: 169 mg/dL — ABNORMAL HIGH (ref 70–99)
Potassium: 3.9 mmol/L (ref 3.5–5.1)
Sodium: 142 mmol/L (ref 135–145)

## 2020-07-25 LAB — MRSA PCR SCREENING: MRSA by PCR: NEGATIVE

## 2020-07-25 LAB — PHOSPHORUS: Phosphorus: 1.7 mg/dL — ABNORMAL LOW (ref 2.5–4.6)

## 2020-07-25 LAB — MAGNESIUM: Magnesium: 2 mg/dL (ref 1.7–2.4)

## 2020-07-25 MED ORDER — CHLORHEXIDINE GLUCONATE CLOTH 2 % EX PADS
6.0000 | MEDICATED_PAD | Freq: Every day | CUTANEOUS | Status: DC
  Administered 2020-07-25 – 2020-07-26 (×2): 6 via TOPICAL

## 2020-07-25 MED ORDER — POTASSIUM & SODIUM PHOSPHATES 280-160-250 MG PO PACK
2.0000 | PACK | Freq: Three times a day (TID) | ORAL | Status: AC
  Administered 2020-07-25 (×2): 2 via ORAL
  Filled 2020-07-25 (×2): qty 2

## 2020-07-25 NOTE — Consult Note (Addendum)
[name], [name] [id number] [birth year] Corey Nearing, MD  Reason for Consult: [name] issue Requesting Physician: Corey Hones, MD Consulting Physician: Corey Nearing, MD  HPI: This 50 y.o. year old male was admitted on 07/23/2020 for Acute respiratory failure ([place]) [J96.00] Acute hypercapnic respiratory failure ([place]) [J96.02]. Patient reported a trach leak with his new trach tube, for which there is not a cap available. Apparently he was trying to tape the trach closed. He does have a Actuary Valve that fits, but was not trying to use it with his BiPAP. He has a h/o COPD with hypercapnea as well as OSA. The BiPAP is used particularly to manage the hypercapnea apparently.   Medications:  Current Facility-Administered Medications  Medication Dose Route Frequency Provider Last Rate Last Admin   apixaban (ELIQUIS) tablet 5 mg  5 mg Oral BID Rust-Chester, Britton L, NP   5 mg at 07/25/20 0930   atorvastatin (LIPITOR) tablet 40 mg  40 mg Oral QHS Rust-Chester, Britton L, NP   40 mg at 07/24/20 2241   Chlorhexidine Gluconate Cloth 2 % PADS 6 each  6 each Topical Daily Rust-Chester, Britton L, NP   6 each at 07/25/20 0932   docusate sodium (COLACE) capsule 100 mg  100 mg Oral BID PRN Rust-Chester, Huel Cote, NP       furosemide (LASIX) tablet 80 mg  80 mg Oral Daily Rust-Chester, Britton L, NP   80 mg at 07/24/20 0951   insulin aspart (novoLOG) injection 0-15 Units  0-15 Units Subcutaneous TID AC & HS Rust-Chester, Britton L, NP   3 Units at 07/25/20 1118   ipratropium-albuterol (DUONEB) 0.5-2.5 (3) MG/3ML nebulizer solution 3 mL  3 mL Nebulization Q4H PRN Rust-Chester, Britton L, NP   3 mL at 07/24/20 1003   lisinopril (ZESTRIL) tablet 30 mg  30 mg Oral Daily Rust-Chester, Britton L, NP   30 mg at 07/24/20 2245   melatonin tablet 5 mg  5 mg Oral QHS Rust-Chester, Britton L, NP   5 mg at 07/24/20 2244   methylPREDNISolone sodium succinate (SOLU-MEDROL) 40 mg/mL injection 40 mg  40 mg Intravenous  Daily Rust-Chester, Britton L, NP   40 mg at 07/25/20 0932   metoprolol tartrate (LOPRESSOR) tablet 100 mg  100 mg Oral BID Rust-Chester, Britton L, NP   100 mg at 07/25/20 0929   mometasone-formoterol (DULERA) 200-5 MCG/ACT inhaler 2 puff  2 puff Inhalation BID Rust-Chester, Britton L, NP   2 puff at 07/25/20 0934   polyethylene glycol (MIRALAX / GLYCOLAX) packet 17 g  17 g Oral Daily PRN Rust-Chester, Toribio Harbour L, NP       potassium & sodium phosphates (PHOS-NAK) 280-160-250 MG packet 2 packet  2 packet Oral TID WC & HS Dallie Piles, RPH   2 packet at 07/25/20 1129   potassium chloride SA (KLOR-CON) CR tablet 20 mEq  20 mEq Oral Daily Rust-Chester, Britton L, NP   20 mEq at 07/25/20 0930   theophylline (THEODUR) 12 hr tablet 300 mg  300 mg Oral BID Rust-Chester, Britton L, NP   300 mg at 07/25/20 1118   tiotropium (SPIRIVA) inhalation capsule ([facility] use ONLY) 18 mcg  18 mcg Inhalation Daily Rust-Chester, Britton L, NP   18 mcg at 07/25/20 0934  .  Medications Prior to Admission  Medication Sig Dispense Refill   aspirin EC 81 MG tablet Take 81 mg by mouth daily. Swallow whole.     atorvastatin (LIPITOR) 40 MG tablet Take 40 mg by  mouth at bedtime.     ELIQUIS 5 MG TABS tablet Take 5 mg by mouth 2 (two) times daily.     fluticasone-salmeterol (ADVAIR) 250-50 MCG/ACT AEPB Inhale 1 puff into the lungs every 12 (twelve) hours.     furosemide (LASIX) 40 MG tablet Take 80 mg by mouth daily.     ipratropium-albuterol (DUONEB) 0.5-2.5 (3) MG/3ML SOLN Take 3 mLs by nebulization every 4 (four) hours.     lisinopril (ZESTRIL) 30 MG tablet Take 30 mg by mouth daily.     melatonin 5 MG TABS Take 5 mg by mouth at bedtime.     metoprolol tartrate (LOPRESSOR) 100 MG tablet Take 100 mg by mouth 2 (two) times daily.     NOVOLOG FLEXPEN 100 UNIT/ML FlexPen Inject 0-16 Units into the skin in the morning, at noon, in the evening, and at bedtime.     Potassium Chloride ER 20 MEQ TBCR Take 20 mEq by mouth daily.      sildenafil (REVATIO) 20 MG tablet Take 20 mg by mouth 3 (three) times daily.     theophylline (THEODUR) 300 MG 12 hr tablet Take 300 mg by mouth 2 (two) times daily.     tiotropium (SPIRIVA) 18 MCG inhalation capsule Place 18 mcg into inhaler and inhale daily.     VENTOLIN HFA 108 (90 Base) MCG/ACT inhaler Inhale 2 puffs into the lungs every 6 (six) hours as needed for wheezing or shortness of breath.      Allergies:  Allergies  Allergen Reactions   Penicillins Anaphylaxis and Other (See Comments)    Has patient had a PCN reaction causing immediate rash, facial/tongue/throat swelling, SOB or lightheadedness with hypotension: Yes Has patient had a PCN reaction causing severe rash involving mucus membranes or skin necrosis: No Has patient had a PCN reaction that required hospitalization: No Has patient had a PCN reaction occurring within the last 10 years: No If all of the above answers are "NO", then may proceed with Cephalosporin use.     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   Stroke [facility])    Tracheostomy status ([place])     Fam Hx:  Family History  Problem Relation Age of Onset   Hypertension Mother    Heart disease Mother    Diabetes Mother    Cancer Father     Soc Hx:  Social History   Socioeconomic History   Marital status: Married    Spouse name: Not  on file   Number of children: Not on file   Years of education: Not on file   Highest education level: Not on file  Occupational History   Occupation: disabled  Tobacco Use   Smoking status: Every Day    Years: 25.00  Pack years: 0.00    Types: Cigarettes   Smokeless tobacco: Never  Vaping Use   Vaping Use: Never used  Substance and Sexual Activity   Alcohol use: Yes    Comment: occassional   Drug use: Yes    Types: Marijuana, PCP   Sexual activity: Yes  Other Topics Concern   Not on file  Social History Narrative   Not on file   Social Determinants of Health   Financial Resource Strain: Not on file  Food Insecurity: Not on file  Transportation Needs: Not on file  Physical Activity: Not on file  Stress: Not on file  Social Connections: Not on file  Intimate Partner Violence: Not on file    PSH:  Past Surgical History:  Procedure Laterality Date   APPENDECTOMY     PARTIAL NEPHRECTOMY     TRACHEOSTOMY TUBE PLACEMENT N/A 01/14/2020   Procedure: TRACHEOSTOMY;  Surgeon: Clyde Canterbury, MD;  Location: [facility] ORS;  Service: ENT;  Laterality: N/A;  . Procedures since admission: No admission procedures for hospital encounter.  ROS: Review of systems normal other than 12 systems except per HPI.  PHYSICAL EXAM  Vitals: Blood pressure 101/73, pulse 73, temperature 98.3 F (36.8 C), temperature source Oral, resp. rate 19, height 6\' 1"  (1.854 m), weight 124.6 kg, SpO2 99 %.. General: Well-developed, Well-nourished in no acute distress Mood: Mood and affect well adjusted, pleasant and cooperative. Orientation: Grossly alert and oriented. Vocal Quality: No hoarseness. Communicates verbally. head and Face: NCAT. No facial asymmetry. No visible skin lesions. No significant facial scars. No tenderness with sinus percussion. Facial strength normal and symmetric. Neck: Currently with Shiley 6.5 cuffed trach tube in place Respiratory: Normal respiratory effort without labored  breathing.   MEDICAL DECISION MAKING: Data Review:  Results for orders placed or performed during the hospital encounter of 07/23/20 (from the past 48 hour(s))  CBC with Differential     Status: None   Collection Time: 07/23/20  5:34 PM  Result Value Ref Range   WBC 5.7 4.0 - 10.5 K/uL   RBC 5.07 4.22 - 5.81 MIL/uL   Hemoglobin 14.3 13.0 - 17.0 g/dL   HCT 43.9 39.0 - 52.0 %   MCV 86.6 80.0 - 100.0 fL   MCH 28.2 26.0 - 34.0 pg   MCHC 32.6 30.0 - 36.0 g/dL   RDW 13.2 11.5 - 15.5 %   Platelets 220 150 - 400 K/uL   nRBC 0.0 0.0 - 0.2 %   Neutrophils Relative % 62 %   Neutro Abs 3.5 1.7 - 7.7 K/uL   Lymphocytes Relative 22 %   Lymphs Abs 1.3 0.7 - 4.0 K/uL   Monocytes Relative 14 %   Monocytes Absolute 0.8 0.1 - 1.0 K/uL   Eosinophils Relative 1 %   Eosinophils Absolute 0.1 0.0 - 0.5 K/uL   Basophils Relative 1 %   Basophils Absolute 0.0 0.0 - 0.1 K/uL   Immature Granulocytes 0 %   Abs Immature Granulocytes 0.02 0.00 - 0.07 K/uL    Comment: Performed at [facility], [address] [place], [place] [address]  Comprehensive metabolic panel     Status: Abnormal   Collection Time: 07/23/20  5:34 PM  Result Value Ref Range   Sodium 139 135 - 145 mmol/L   Potassium 4.4 3.5 - 5.1 mmol/L   Chloride 101 98 - 111 mmol/L   CO2 33 (H) 22 - 32 mmol/L   Glucose, Bld 134 (H) 70 - 99 mg/dL  Comment: Glucose reference range applies only to samples taken after fasting for at least 8 hours.   BUN 20 6 - 20 mg/dL   Creatinine, Ser 1.53 (H) 0.61 - 1.24 mg/dL   Calcium 8.4 (L) 8.9 - 10.3 mg/dL   Total Protein 7.0 6.5 - 8.1 g/dL   Albumin 3.8 3.5 - 5.0 g/dL   AST 25 15 - 41 U/L   ALT 33 0 - 44 U/L   Alkaline Phosphatase 65 38 - 126 U/L   Total Bilirubin 0.6 0.3 - 1.2 mg/dL   GFR, Estimated 55 (L) >60 mL/min    Comment: (NOTE) Calculated using the CKD-EPI Creatinine Equation (2021)    Anion gap 5 5 - 15    Comment: Performed at [facility], [place], [place] [address]  Troponin I (High Sensitivity)     Status: Abnormal   Collection Time: 07/23/20  5:34 PM  Result Value Ref Range   Troponin I (High Sensitivity) 35 (H) <18 ng/L    Comment: (NOTE) Elevated high sensitivity troponin I (hsTnI) values and significant  changes across serial measurements may suggest ACS but many other  chronic and acute conditions are known to elevate hsTnI results.  Refer to the "Links" section for chest pain algorithms and additional  guidance. Performed at [facility], [address] [place], [place] [address]   Brain natriuretic peptide     Status: Abnormal   Collection Time: 07/23/20  5:34 PM  Result Value Ref Range   B Natriuretic Peptide 218.9 (H) 0.0 - 100.0 pg/mL    Comment: Performed at [facility], [address] [place], [place] [address]  Resp Panel by RT-PCR (Flu A&B, Covid) Nasopharyngeal Swab     Status: None   Collection Time: 07/23/20  7:36 PM   Specimen: Nasopharyngeal Swab; Nasopharyngeal(NP) swabs in vial transport medium  Result Value Ref Range   SARS Coronavirus 2 by RT PCR NEGATIVE NEGATIVE    Comment: (NOTE) SARS-CoV-2 target nucleic acids are NOT DETECTED.  The SARS-CoV-2 RNA is generally detectable in upper respiratory specimens during the acute phase of infection. The lowest concentration of SARS-CoV-2 viral copies this assay can detect is 138 copies/mL. A negative result does not preclude SARS-Cov-2 infection and should not be used as the sole basis for treatment or other patient management decisions. A negative result may occur with  improper specimen collection/handling, submission of specimen other than nasopharyngeal swab, presence of viral mutation(s) within the areas targeted by this assay, and inadequate number of viral copies(<138 copies/mL). A negative result must be combined with clinical observations, patient history, and epidemiological information. The expected result is  Negative.  Fact Sheet for Patients:  EntrepreneurPulse.com.au  Fact Sheet for Healthcare Providers:  IncredibleEmployment.be  This test is no t yet approved or cleared by the [place] FDA and  has been authorized for detection and/or diagnosis of SARS-CoV-2 by FDA under an Emergency Use Authorization (EUA). This EUA will remain  in effect (meaning this test can be used) for the duration of the COVID-19 declaration under Section 564(b)(1) of the Act, 21 U.S.C.section 360bbb-3(b)(1), unless the authorization is terminated  or revoked sooner.       Influenza A by PCR NEGATIVE NEGATIVE   Influenza B by PCR NEGATIVE NEGATIVE    Comment: (NOTE) The Xpert Xpress SARS-CoV-2/FLU/RSV plus assay is intended as an aid in the diagnosis of influenza from Nasopharyngeal swab specimens and should not be used as a sole basis  for treatment. Nasal washings and aspirates are unacceptable for Xpert Xpress SARS-CoV-2/FLU/RSV testing.  Fact Sheet for Patients: EntrepreneurPulse.com.au  Fact Sheet for Healthcare Providers: IncredibleEmployment.be  This test is not yet approved or cleared by the [place] FDA and has been authorized for detection and/or diagnosis of SARS-CoV-2 by FDA under an Emergency Use Authorization (EUA). This EUA will remain in effect (meaning this test can be used) for the duration of the COVID-19 declaration under Section 564(b)(1) of the Act, 21 U.S.C. section 360bbb-3(b)(1), unless the authorization is terminated or revoked.  Performed at [facility], [address] [place], [place] [address]   Blood gas, venous     Status: Abnormal   Collection Time: 07/23/20  7:36 PM  Result Value Ref Range   pH, Ven 7.26 7.250 - 7.430   pCO2, Ven 93 (HH) 44.0 - 60.0 mmHg    Comment: CRITICAL RESULT, NOTIFIED PHYSICIAN DR ROBINSON    pO2, Ven <31.0 (LL) 32.0 - 45.0 mmHg   Bicarbonate  41.7 (H) 20.0 - 28.0 mmol/L   Acid-Base Excess 9.8 (H) 0.0 - 2.0 mmol/L   O2 Saturation 46.7 %   Patient temperature 37.0    Collection site VEIN    Sample type VENOUS     Comment: Performed at [facility], [place], [place] [address]  Troponin I (High Sensitivity)     Status: Abnormal   Collection Time: 07/23/20  7:36 PM  Result Value Ref Range   Troponin I (High Sensitivity) 34 (H) <18 ng/L    Comment: (NOTE) Elevated high sensitivity troponin I (hsTnI) values and significant  changes across serial measurements may suggest ACS but many other  chronic and acute conditions are known to elevate hsTnI results.  Refer to the "Links" section for chest pain algorithms and additional  guidance. Performed at [facility], [address] [place], [place] [address]   Blood gas, arterial     Status: Abnormal   Collection Time: 07/23/20 11:00 PM  Result Value Ref Range   FIO2 0.30    Delivery systems VENTILATOR    Mode PRESSURE REGULATED VOLUME CONTROL    VT 500 mL   LHR 18 resp/min   Peep/cpap 5.0 cm H20   pH, Arterial 7.40 7.350 - 7.450   pCO2 arterial 60 (H) 32.0 - 48.0 mmHg   pO2, Arterial 70 (L) 83.0 - 108.0 mmHg   Bicarbonate 37.2 (H) 20.0 - 28.0 mmol/L   Acid-Base Excess 9.9 (H) 0.0 - 2.0 mmol/L   O2 Saturation 93.8 %   Patient temperature 37.0    Collection site LEFT RADIAL    Sample type ARTERIAL DRAW    Allens test (pass/fail) PASS PASS    Comment: Performed at [facility], [address] [place], [place] [address]  CBG monitoring, ED     Status: Abnormal   Collection Time: 07/24/20 12:45 AM  Result Value Ref Range   Glucose-Capillary 138 (H) 70 - 99 mg/dL    Comment: Glucose reference range applies only to samples taken after fasting for at least 8 hours.  CBG monitoring, ED     Status: Abnormal   Collection Time: 07/24/20  8:42 AM  Result Value Ref Range   Glucose-Capillary 134 (H) 70 - 99 mg/dL    Comment: Glucose  reference range applies only to samples taken after fasting for at least 8 hours.  Basic metabolic panel     Status: Abnormal   Collection Time: 07/24/20  8:46 AM  Result Value Ref Range   Sodium 143 135 - 145 mmol/L   Potassium 4.4 3.5 - 5.1 mmol/L   Chloride 104 98 - 111 mmol/L   CO2 33 (H) 22 - 32 mmol/L   Glucose, Bld 137 (H) 70 - 99 mg/dL    Comment: Glucose reference range applies only to samples taken after fasting for at least 8 hours.   BUN 25 (H) 6 - 20 mg/dL   Creatinine, Ser 1.65 (H) 0.61 - 1.24 mg/dL   Calcium 8.5 (L) 8.9 - 10.3 mg/dL   GFR, Estimated 50 (L) >60 mL/min    Comment: (NOTE) Calculated using the CKD-EPI Creatinine Equation (2021)    Anion gap 6 5 - 15    Comment: Performed at [facility], [address] [place], [place] [address]  Magnesium     Status: None   Collection Time: 07/24/20  8:46 AM  Result Value Ref Range   Magnesium 2.0 1.7 - 2.4 mg/dL    Comment: Performed at [facility], [address] [place], [place] [address]  Phosphorus     Status: None   Collection Time: 07/24/20  8:46 AM  Result Value Ref Range   Phosphorus 3.9 2.5 - 4.6 mg/dL    Comment: Performed at [facility], [address] [place], [place] [address]  CBC     Status: None   Collection Time: 07/24/20  8:46 AM  Result Value Ref Range   WBC 8.5 4.0 - 10.5 K/uL   RBC 5.08 4.22 - 5.81 MIL/uL   Hemoglobin 14.0 13.0 - 17.0 g/dL   HCT 44.2 39.0 - 52.0 %   MCV 87.0 80.0 - 100.0 fL   MCH 27.6 26.0 - 34.0 pg   MCHC 31.7 30.0 - 36.0 g/dL   RDW 13.2 11.5 - 15.5 %   Platelets 217 150 - 400 K/uL   nRBC 0.0 0.0 - 0.2 %    Comment: Performed at [facility], [address] [place], [place] [address]  CBG monitoring, ED     Status: Abnormal   Collection Time: 07/24/20 12:21 PM  Result Value Ref Range   Glucose-Capillary 174 (H) 70 - 99 mg/dL    Comment: Glucose reference range applies only to samples taken after fasting for at least 8  hours.  Glucose, capillary     Status: Abnormal   Collection Time: 07/24/20  5:36 PM  Result Value Ref Range   Glucose-Capillary 174 (H) 70 - 99 mg/dL    Comment: Glucose reference range applies only to samples taken after fasting for at least 8 hours.  Glucose, capillary     Status: Abnormal   Collection Time: 07/24/20  8:42 PM  Result Value Ref Range   Glucose-Capillary 162 (H) 70 - 99 mg/dL    Comment: Glucose reference range applies only to samples taken after fasting for at least 8 hours.  MRSA PCR Screening     Status: None   Collection Time: 07/25/20 12:13 AM  Result Value Ref Range   MRSA by PCR NEGATIVE NEGATIVE    Comment:        The GeneXpert MRSA Assay (FDA approved for NASAL specimens only), is one component of a comprehensive MRSA colonization surveillance program. It is not intended to diagnose MRSA infection nor to guide or monitor treatment for MRSA infections. Performed at [facility], [address] [place], [place] [address]   Glucose, capillary     Status: Abnormal   Collection Time:  07/25/20 12:17 AM  Result Value Ref Range   Glucose-Capillary 106 (H) 70 - 99 mg/dL    Comment: Glucose reference range applies only to samples taken after fasting for at least 8 hours.  Basic metabolic panel     Status: Abnormal   Collection Time: 07/25/20  5:43 AM  Result Value Ref Range   Sodium 142 135 - 145 mmol/L   Potassium 3.9 3.5 - 5.1 mmol/L   Chloride 104 98 - 111 mmol/L   CO2 32 22 - 32 mmol/L   Glucose, Bld 169 (H) 70 - 99 mg/dL    Comment: Glucose reference range applies only to samples taken after fasting for at least 8 hours.   BUN 30 (H) 6 - 20 mg/dL   Creatinine, Ser 1.71 (H) 0.61 - 1.24 mg/dL   Calcium 8.2 (L) 8.9 - 10.3 mg/dL   GFR, Estimated 48 (L) >60 mL/min    Comment: (NOTE) Calculated using the CKD-EPI Creatinine Equation (2021)    Anion gap 6 5 - 15    Comment: Performed at [facility], [address] [place],  [place] [address]  Magnesium     Status: None   Collection Time: 07/25/20  5:43 AM  Result Value Ref Range   Magnesium 2.0 1.7 - 2.4 mg/dL    Comment: Performed at [facility], [address] [place], [place] [address]  Phosphorus     Status: Abnormal   Collection Time: 07/25/20  5:43 AM  Result Value Ref Range   Phosphorus 1.7 (L) 2.5 - 4.6 mg/dL    Comment: Performed at [facility], [address] [place], [place] [address]  CBC     Status: Abnormal   Collection Time: 07/25/20  5:43 AM  Result Value Ref Range   WBC 11.1 (H) 4.0 - 10.5 K/uL   RBC 4.65 4.22 - 5.81 MIL/uL   Hemoglobin 12.9 (L) 13.0 - 17.0 g/dL   HCT 40.0 39.0 - 52.0 %   MCV 86.0 80.0 - 100.0 fL   MCH 27.7 26.0 - 34.0 pg   MCHC 32.3 30.0 - 36.0 g/dL   RDW 13.4 11.5 - 15.5 %   Platelets 212 150 - 400 K/uL   nRBC 0.0 0.0 - 0.2 %    Comment: Performed at [facility], [address] [place], [place] [address]  Glucose, capillary     Status: Abnormal   Collection Time: 07/25/20  7:25 AM  Result Value Ref Range   Glucose-Capillary 172 (H) 70 - 99 mg/dL    Comment: Glucose reference range applies only to samples taken after fasting for at least 8 hours.  Glucose, capillary     Status: Abnormal   Collection Time: 07/25/20 11:08 AM  Result Value Ref Range   Glucose-Capillary 165 (H) 70 - 99 mg/dL    Comment: Glucose reference range applies only to samples taken after fasting for at least 8 hours.  . DG Chest 2 View  Result Date: 07/23/2020 CLINICAL DATA:  Shortness of breath for 2 days with dizziness, initial encounter EXAM: CHEST - 2 VIEW COMPARISON:  02/01/2020, CT from 02/03/2018 FINDINGS: Cardiac shadow is stable. Tracheostomy tube is noted in satisfactory position. Scarring is noted along the minor fissure with some associated nodularity improved when compared with prior exam. The changes of been present on multiple previous exams to include a CT from 02/03/2018. Areas of scarring are noted  on the left slightly accentuated when compared with the prior exam. Posterior soft  tissue density is noted likely in the left lung base but incompletely evaluated on this exam. This could represent some loculated effusion. This was present on prior plain film from 01/01/2020. No acute bony abnormality is noted. IMPRESSION: Bilateral scarring as well as what appears to be loculated effusion posteriorly on the left stable in appearance from November of 2021. No new focal abnormality is noted. Electronically Signed   By: Inez Catalina M.D.   On: 07/23/2020 18:13  .   ASSESSMENT: COPD with hypercapnea on BiPAP face mask at night with trach in place  PLAN: I have discussed with pulmonology that he will have some leak around the stoma with this situation. I was initially under the impression he was complaining of leak while using the PMV, but apparently he was not using it, just trying to tape the trach hole closed. There is not a true cap available for these new Shiley trachs, and giving him the old style from our OR is not a sustainable solution as it has been discontinued and he will not be able to get supplies and replacements for it long term.   Reasonable to try him on the BiPAP with the PMV in place with inpatient monitoring to see if this works. The PMV should close with the positive pressure of the BiPAP during inhalation, though there will be leak around the trach tube from the stoma. If he cannot be maintained with that set-up, he may actually need to keep a cuffed tube in and inflate the cuff at night, hooking the trach up to the vent for a completely closed circuit. RT has agreed to trade out the current cuffed tube for a non-cuffed tube so this can be tried.    Corey Nearing, MD 07/25/2020 2:09 PM

## 2020-07-25 NOTE — Progress Notes (Signed)
PHARMACY CONSULT NOTE - FOLLOW UP  Pharmacy Consult for Electrolyte Monitoring and Replacement   Recent Labs: Potassium (mmol/L)  Date Value  07/25/2020 3.9  05/28/2014 3.9   Magnesium (mg/dL)  Date Value  07/25/2020 2.0  05/14/2013 1.8   Calcium (mg/dL)  Date Value  07/25/2020 8.2 (L)   Calcium, Total (mg/dL)  Date Value  05/28/2014 8.4 (L)   Albumin (g/dL)  Date Value  07/23/2020 3.8  06/01/2013 3.8   Phosphorus (mg/dL)  Date Value  07/25/2020 1.7 (L)  03/09/2013 3.0   Sodium (mmol/L)  Date Value  07/25/2020 142  05/28/2014 140   Corrected calcium: 8.36 mg/dL  Assessment: Pharmacy consulted to assist in monitoring and replacing electrolytes while in the CCU in this 50 y.o male admitted on 07/23/2020 with shortness of breath and headache. Pt on Lasix 80 mg PO daily and KCL 20 mEq PO daily.  Goal of Therapy:  Potassium 4.0-5.1 mmol/L Magnesium 2.0-2.4 mg/dL All other electrolytes within normal limits  Plan:   - Replace phosphorous with 2 packets of Phos-Nak x 2 (each packet contains 250 mg of elemental phosphorous)  - F/u with am labs     Geraldo Docker PharmD Candidate at [facility]  07/25/2020 8:56 AM

## 2020-07-25 NOTE — Progress Notes (Signed)
A&O patient admitted to ICU for trach/ventilator management. Patient states that he requires a bipap qhs, floor care is not able to provide this at this time, requiring him to be transferred to ICU to be placed on the ventilator at night.

## 2020-07-25 NOTE — Progress Notes (Signed)
Patient taken off of vent and placed on 40% ATC, Cuff deflated, and passey muir valve placed on to tracheostomy tube. Patient tolerating well at this time.

## 2020-07-25 NOTE — Progress Notes (Signed)
Transferred pt from [facility]120 to [place]. Report given to Nena Alexander, RN with all questions answered. Pt made aware of transfer and said he would let his family know. Pt VSS prior to and throughout transfer.

## 2020-07-25 NOTE — Progress Notes (Signed)
Patient has been seen by intensivist today, I will pick up tomorrow.

## 2020-07-25 NOTE — Progress Notes (Signed)
Pt trach changed to [place] 6 cuffless. Per conversation with Dr Richardson Landry, pt is to wear PMV and home BiPAP tonight. Per Dr Vaughan Browner pt is to have ABG tonight prior to BiPAP mask placement and an ABG tomorrow morning to check for changes in CO2

## 2020-07-25 NOTE — Progress Notes (Signed)
NAME:  [name], MRN:  [id number], DOB:  May 02, 1970, LOS: 2 ADMISSION DATE:  07/23/2020, CONSULTATION DATE:  07/23/2020 REFERRING MD:  Dr. Quentin Cornwall, CHIEF COMPLAINT:  Shortness of breath and headache  History of Present Illness:  50 year old male presenting to [facility] ED with complaints of generalized malaise, congestion, lightheadedness, headache and exertional dyspnea for the past several days.   ED course:  Patient denies fevers, increased edema/pain with deep inspiration.  He also confirms being compliant on his Eliquis.  Overall he reports feeling very fatigued and states he has not been sleeping well for the past five days.  No known sick contacts, no recent antibiotics, no recent steroids.  Patient reports wearing chronic 2 L nasal cannula during the day then capping his trach using BiPAP overnight. Patient admitted to not having the appropriate caps when his tracheostomy was recently changed. He explained that he had to resort to taping his trach at night while on his BIPAP mask. He is unsure how much leakage during the night might have occurred. Patient received decadron- 10 mg, duoneb x 1 and reglan. Initial vitals: Afebrile at 98.3, mildly tachypneic at 22, HR 73, BP stable at 134/96, SPO2 98% Significant labs: COVID-19 & influenza a/B negative, serum CO2 33, creatinine 1.53, troponin 35> 34, CBC WNL, VBG shows respiratory acidosis with some compensation: 7.26/93/<31/41.7.  Chest x-ray shows bilateral scarring as well as what appears to be loculated effusion posteriorly on the left (stable in appearance from 12/2019)  Due to the patient's symptomatic hypercapnia and this facility's inability to place the patient on BiPAP in a similar fashion to what he does at home, decision was made to place him on ventilator support overnight.  PCCM consulted to admit to ICU as the patient will be requiring ventilator assistance.  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 07/24/20- patient is improved, he is speaking fully in complete sentences with 11L/min via mask over vent.  07/25/20-remains on the ventilator at night on trach collar during day  Interim History / Subjective:   Awake, responsive. No complaints today.  Objective   Blood pressure 101/73, pulse 73, temperature 98.3 F (36.8 C), temperature source Oral, resp. rate 19, height 6\' 1"  (1.854 m), weight 124.6 kg, SpO2 99 %.    Vent Mode: PRVC FiO2 (%):  [30 %-40 %] 40 % Set Rate:  [18 bmp] 18 bmp Vt Set:  [500 mL] 500 mL PEEP:  [5 cmH20] 5 cmH20 Plateau Pressure:  [24 cmH20-27 cmH20] 24 cmH20   Intake/Output Summary (Last 24 hours) at 07/25/2020 1137 Last data filed at 07/25/2020 1100 Gross per 24 hour  Intake 240 ml  Output 1275 ml  Net -1035 ml   Filed Weights   07/23/20 1729 07/25/20 0100  Weight: 124.7 kg 124.6 kg    Examination: Gen:      No acute distress HEENT:  EOMI, sclera anicteric Neck:     No masses; no thyromegaly Lungs:    Clear to auscultation bilaterally; normal respiratory effort CV:  Regular rate and rhythm; no murmurs Abd:      + bowel sounds; soft, non-tender; no palpable masses, no distension Ext:   1+ edema; adequate peripheral perfusion Skin:      Warm and dry; no rash Neuro: alert and oriented x 3 Psych: normal mood and affect   Labs/imaging that I have personally reviewed  (right click and "Reselect all SmartList  Selections" daily)  BUN/creatinine 30/1.71, WBC 11.1, platelets 212 No new imaging Resolved Hospital Problem list     Assessment & Plan:  Acute on chronic Hypercapnic Respiratory Failure in the setting of suspected faulty home equipment PMHx: COPD, OSA, former smoker Patient reports having his tracheostomy changed out for a newer version and since that time the caps he places on the trach at night when using his BiPAP mask have not fit and he has been utilizing tape. Still having issues with the Trach cap and hence requiring vent at night for hypercarbic respiratory Reached out to ENT as he may need to trach change back to his regular trach to be able to tolerate trach capping on BiPAP at night Continue home bronchodilator regimen, Solu-Medrol Intermittent chest x-ray, ABG  Chronic HFpEF Essential HTN Continue home regimen: metoprolol, lisinopril & furosemide Continue home K+ replacement daily  Paroxysmal Atrial Fibrillation  CHA2DS2-VASc score: 3 Continue Eliquis, beta-blocker  Hyperlipidemia Continue home statin  CKD Stage 3a Creatinine at recent baseline 1.4-1.5 - Strict I/O's: alert provider if UOP < 0.5 mL/kg/hr - Daily BMP, replace electrolytes PRN - Avoid nephrotoxic agents as able, ensure adequate renal perfusion> restarted home antihypertensives, if renal function worsens consider holding lisinopril/ furosemide  Type 2 Diabetes Mellitus - Monitor CBG Q 4 AC, HS - SSI resistant dosing, consider Lantus if needed - target range while in ICU: 140-180 - follow ICU hyper/hypo-glycemia protocol  Best Practice (right click and "Reselect all SmartList Selections" daily)  Diet/type: Regular consistency (see orders) Pain/Anxiety/Delirium protocol Not indicated VAP protocol (if indicated): Yes DVT prophylaxis: DOAC GI prophylaxis: N/A Glucose control:  SSI Central venous access:  N/A Arterial line:  N/A Foley:  N/A Mobility:  OOB  PT consulted: N/A Studies pending:  None Culture data pending:none Last reviewed culture data: protect  N/A Antibiotics:not indicated  Antibiotic de-escalation: N/A Stop date: N/A Daily labs: ordered Code Status:  full code Last date of multidisciplinary goals of care discussion 07/23/20 ccm prognosis: stable  Disposition: Continue current level of care   Signature:   Marshell Garfinkel MD [place] See Amion for pager  If no response to pager , please call [phone] until 7pm After 7:00 pm call Elink  [phone] 07/25/2020, 11:51 AM

## 2020-07-26 DIAGNOSIS — J9622 Acute and chronic respiratory failure with hypercapnia: Secondary | ICD-10-CM | POA: Diagnosis not present

## 2020-07-26 LAB — BLOOD GAS, ARTERIAL
Acid-Base Excess: 8.8 mmol/L — ABNORMAL HIGH (ref 0.0–2.0)
Bicarbonate: 36.7 mmol/L — ABNORMAL HIGH (ref 20.0–28.0)
Delivery systems: POSITIVE
FIO2: 0.4
O2 Saturation: 97.3 %
Patient temperature: 37
pCO2 arterial: 65 mmHg — ABNORMAL HIGH (ref 32.0–48.0)
pH, Arterial: 7.36 (ref 7.350–7.450)
pO2, Arterial: 97 mmHg (ref 83.0–108.0)

## 2020-07-26 LAB — GLUCOSE, CAPILLARY: Glucose-Capillary: 141 mg/dL — ABNORMAL HIGH (ref 70–99)

## 2020-07-26 LAB — BASIC METABOLIC PANEL
Anion gap: 5 (ref 5–15)
BUN: 26 mg/dL — ABNORMAL HIGH (ref 6–20)
CO2: 35 mmol/L — ABNORMAL HIGH (ref 22–32)
Calcium: 8.5 mg/dL — ABNORMAL LOW (ref 8.9–10.3)
Chloride: 103 mmol/L (ref 98–111)
Creatinine, Ser: 1.53 mg/dL — ABNORMAL HIGH (ref 0.61–1.24)
GFR, Estimated: 55 mL/min — ABNORMAL LOW (ref >60)
Glucose, Bld: 145 mg/dL — ABNORMAL HIGH (ref 70–99)
Potassium: 4.3 mmol/L (ref 3.5–5.1)
Sodium: 143 mmol/L (ref 135–145)

## 2020-07-26 LAB — PHOSPHORUS: Phosphorus: 3.4 mg/dL (ref 2.5–4.6)

## 2020-07-26 LAB — MAGNESIUM: Magnesium: 2.1 mg/dL (ref 1.7–2.4)

## 2020-07-26 NOTE — Progress Notes (Signed)
PHARMACY CONSULT NOTE - FOLLOW UP  Pharmacy Consult for Electrolyte Monitoring and Replacement   Recent Labs: Potassium (mmol/L)  Date Value  07/26/2020 4.3  05/28/2014 3.9   Magnesium (mg/dL)  Date Value  07/26/2020 2.1  05/14/2013 1.8   Calcium (mg/dL)  Date Value  07/26/2020 8.5 (L)   Calcium, Total (mg/dL)  Date Value  05/28/2014 8.4 (L)   Albumin (g/dL)  Date Value  07/23/2020 3.8  06/01/2013 3.8   Phosphorus (mg/dL)  Date Value  07/26/2020 3.4  03/09/2013 3.0   Sodium (mmol/L)  Date Value  07/26/2020 143  05/28/2014 140   Corrected calcium: 8.66 mg/dL  Assessment: [name] is a 50 y.o male w/ h/o  Afib and HFpEF presenting with acute respiratory failure with hypercapnia admitted on 07/23/2020 to CCU . Pharmacy consulted to assist in monitoring and replacing electrolytes while in the CCU.  Pt remains on Lasix 80 mg PO daily and KCL 20 mEq PO daily. Phos: 1.7>3.4 (PhosNaK 2packets x2; 6/20)  Goal of Therapy:  Potassium 4.0-5.1 mmol/L Magnesium 2.0-2.4 mg/dL All other electrolytes within normal limits  Plan:  No electrolyte replenishment necessary at this time. Continue to monitor electrolytes and follow-up with am labs.    Geraldo Docker PharmD Candidate at [facility]  07/26/2020 11:31 AM

## 2020-07-26 NOTE — Discharge Instructions (Signed)
He can be discharged home with current set up.  He will use the PMV at night to prevent air blowing off with Bipap.  He still has some leak around the trach but appears to be minimal and is getting adequate ventilation via BiPAP to maintain CO2.  I have discussed venting directly through the trachea using a cuffed trach which would be ideal but patient would like to keep the current set up.

## 2020-07-26 NOTE — Progress Notes (Signed)
NAME:  [name], MRN:  [id number], DOB:  24-Jun-1970, LOS: 3 ADMISSION DATE:  07/23/2020, CONSULTATION DATE:  07/23/2020 REFERRING MD:  Dr. Quentin Cornwall, CHIEF COMPLAINT:  Shortness of breath and headache  History of Present Illness:  50 year old male presenting to [facility] ED with complaints of generalized malaise, congestion, lightheadedness, headache and exertional dyspnea for the past several days.   ED course:  Patient denies fevers, increased edema/pain with deep inspiration.  He also confirms being compliant on his Eliquis.  Overall he reports feeling very fatigued and states he has not been sleeping well for the past five days.  No known sick contacts, no recent antibiotics, no recent steroids.  Patient reports wearing chronic 2 L nasal cannula during the day then capping his trach using BiPAP overnight. Patient admitted to not having the appropriate caps when his tracheostomy was recently changed. He explained that he had to resort to taping his trach at night while on his BIPAP mask. He is unsure how much leakage during the night might have occurred. Patient received decadron- 10 mg, duoneb x 1 and reglan.  Initial vitals: Afebrile at 98.3, mildly tachypneic at 22, HR 73, BP stable at 134/96, SPO2 98% Significant labs: COVID-19 & influenza a/B negative, serum CO2 33, creatinine 1.53, troponin 35> 34, CBC WNL, VBG shows respiratory acidosis with some compensation: 7.26/93/<31/41.7.  Chest x-ray shows bilateral scarring as well as what appears to be loculated effusion posteriorly on the left (stable in appearance from 12/2019)  Due to the patient's symptomatic hypercapnia and this facility's inability to place the patient on BiPAP in a similar fashion to what he does at home, decision was made to place him on ventilator support overnight.  PCCM consulted to admit to ICU as the patient will be requiring ventilator assistance.  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 07/24/20- patient is improved, he is speaking fully in complete sentences with 11L/min via mask over vent.  07/25/20-remains on the ventilator at night on trach collar during day  Interim History / Subjective:   Slept overnight with PMV.  Was able to try BiPAP via facemask.  ABGs reviewed with compensated CO2 elevation.  Objective   Blood pressure 126/80, pulse 63, temperature 97.9 F (36.6 C), temperature source Oral, resp. rate (!) 25, height 6\' 1"  (1.854 m), weight 126 kg, SpO2 100 %.    FiO2 (%):  [40 %] 40 %   Intake/Output Summary (Last 24 hours) at 07/26/2020 0931 Last data filed at 07/26/2020 0420 Gross per 24 hour  Intake --  Output 975 ml  Net -975 ml   Filed Weights   07/23/20 1729 07/25/20 0100 07/26/20 0452  Weight: 124.7 kg 124.6 kg 126 kg    Examination: Gen:      No acute distress HEENT:  EOMI, sclera anicteric Neck:     No masses; no thyromegaly Lungs:    Clear to auscultation bilaterally; normal respiratory effort CV:         Regular rate and rhythm; no murmurs Abd:      +  bowel sounds; soft, non-tender; no palpable masses, no distension Ext:    No edema; adequate peripheral perfusion Skin:      Warm and dry; no rash Neuro: alert and oriented x 3 Psych: normal mood and affect   Labs/imaging that I have personally reviewed  (right click and "Reselect all SmartList Selections" daily)   Creatinine 1.53 ABG 7.36, PCO2 65, PO2 97 No  new imaging  Resolved Hospital Problem list     Assessment & Plan:  Acute on chronic Hypercapnic Respiratory Failure in the setting of suspected faulty home equipment PMHx: COPD, OSA, former smoker Patient reports having his tracheostomy changed out for a newer version and since that time the caps he places on the trach at night when using his BiPAP mask have not fit and he has been utilizing tape. Still having issues with the Trach cap and hence requiring vent at night for hypercarbic respiratory  Discussed with the NP and RT yesterday.  Since we are having difficulty finding a trach cap the plan was to let him keep the PMV overnight with BiPAP use via facemask.  He has tolerated this well I have reviewed the ABGs which show CO2 is stable.  He can be discharged home with current set up.  He will use the PMV at night to prevent air blowing off with Bipap.  He still has some leak around the trach but appears to be minimal and is getting adequate ventilation via BiPAP to maintain CO2.  I have discussed venting directly through the trachea using a cuffed trach which would be ideal but patient would like to keep the current set up.  Chronic HFpEF Essential HTN Continue home regimen: metoprolol, lisinopril & furosemide Continue home K+ replacement daily  Paroxysmal Atrial Fibrillation  CHA2DS2-VASc score: 3 Continue Eliquis, beta-blocker  Hyperlipidemia Continue home statin  CKD Stage 3a Creatinine at recent baseline 1.4-1.5 Follow urine output and creatinine  Type 2 Diabetes Mellitus - Monitor CBG Q 4 AC, HS - SSI resistant dosing, consider Lantus if needed - target range while in ICU: 140-180 - follow ICU hyper/hypo-glycemia protocol  Best Practice (right click and "Reselect all SmartList Selections" daily)  Per primary team  Signature:   Marshell Garfinkel MD [place] [facility] See Amion for pager  If no response to pager , please call [phone] until  7pm After 7:00 pm call Elink  [phone] 07/26/2020, 9:31 AM

## 2020-07-26 NOTE — Discharge Summary (Addendum)
Physician Discharge Summary  Patient ID: [name] MRN: [id number] DOB/AGE: 1970/08/25 50 y.o.  Admit date: 07/23/2020 Discharge date: 07/26/2020    Discharge Diagnoses:                                                                        DISCHARGE PLAN BY DIAGNOSIS     Plan: Acute on chronic Hypercapnic Respiratory Failure in the setting of suspected faulty home equipment PMHx: COPD, OSA, former smoker Patient reports having his tracheostomy changed out for a newer version and since that time the caps he places on the trach at night when using his BiPAP mask have not fit and he has been utilizing tape. off of vent and back on chronic 2 L [place]  - home regimen reinstated: Theophylline, Dulera inhaler, Spiriva daily, bronchodilators PRN  Plan: Chronic HFpEF Essential HTN - continue home regimen: metoprolol, lisinopril & furosemide - continue home K+ replacement daily  Plan: Paroxysmal Atrial Fibrillation CHA2DS2-VASc score: 3 -continue home Eliquis which patient reports taking faithfully - continue home BB  Plan: Hyperlipidemia - continue home atorvastatin  Plan: CKD Stage 3a Creatinine at recent baseline 1.4-1.5 - Avoid nephrotoxic agents as able, ensure adequate renal perfusion> restarted home antihypertensives, if renal function worsens consider holding lisinopril/ furosemide  Plan: Type 2 Diabetes Mellitus - Monitor CBG Q 4 AC, HS - SSI resistant dosing, consider Lantus if needed - Restarted home meds                DISCHARGE SUMMARY   [name] is a 50 y.o. y/o male with a PMH of HFpEF -LVEF 45 to 50% (2020), NYHA class II, CKD stage IIIa, renal cell carcinoma status post partial nephrectomy, COPD with chronic trach, OSA, Pulmonary Sarcoidosis, CVA CAD, Paroxsymal atrial fibrillation -on Eliquis, MI, Hypertension, Hyperlipidemia, Type 2 diabetes mellitus and Former smoker who was admitted on 07/23/2020 with Acute on chronic Hypercapnic Respiratory  Failure in the setting of suspected faulty home equipment. Due to the patient's symptomatic hypercapnia and this facility's inability to place the patient on BiPAP in a similar fashion to what he does at home, decision was made to place him on ventilator support overnight. PCCM consulted to admit to ICU as the patient will be requiring ventilator assistance.           SIGNIFICANT DIAGNOSTIC STUDIES 07/23/2020: Bilateral scarring as well as what appears to be loculated effusion posteriorly on the left stable in appearance from November of 2021.No new focal abnormality is noted  SIGNIFICANT EVENTS 07/23/2020-admit to ICU with acute on chronic hypercapnic respiratory failure requiring ventilator support in the setting of a chronic tracheostomy. 07/24/20- patient is improved, he is speaking fully in complete sentences with 11L/min via mask over vent. 07/25/20-remains on the ventilator at night on trach collar during day 07/26/20- Off vent now back on home 02 @ 2L   Discharge Exam: GENERAL:50  year-old critically ill patient lying in the bed with no acute distress.  EYES: Pupils equal, round, reactive to light and accommodation. No scleral icterus. Extraocular muscles intact.  HEENT: Head atraumatic, normocephalic. Oropharynx and nasopharynx clear.  NECK:  Supple, no jugular venous distention. No thyroid enlargement, no tenderness.  LUNGS: Normal breath sounds bilaterally, no  wheezing, rales,rhonchi or crepitation. No use of accessory muscles of respiration.  CARDIOVASCULAR: S1, S2 normal. No murmurs, rubs, or gallops.  ABDOMEN: Soft, nontender, nondistended. Bowel sounds present. No organomegaly or mass.  EXTREMITIES: No pedal edema, cyanosis, or clubbing.  NEUROLOGIC: Cranial nerves II through XII are intact.  Muscle strength 5/5 in all extremities. Sensation intact. Gait not checked.  PSYCHIATRIC: The patient is alert and oriented x 3.  SKIN: No obvious rash, lesion, or ulcer. Trach site  WNL  Vitals:   07/26/20 0900 07/26/20 1000 07/26/20 1053 07/26/20 1100  BP: 112/73 (!) 98/48 111/65 (!) 120/57  Pulse: 63 72 71 77  Resp: (!) 32 (!) 29    Temp:      TempSrc: Oral     SpO2: (!) 88% (!) 88%  91%  Weight:      Height:         Discharge Labs  BMET Recent Labs  Lab 07/23/20 1734 07/24/20 0846 07/25/20 0543 07/26/20 0447  NA 139 143 142 143  K 4.4 4.4 3.9 4.3  CL 101 104 104 103  CO2 33* 33* 32 35*  GLUCOSE 134* 137* 169* 145*  BUN 20 25* 30* 26*  CREATININE 1.53* 1.65* 1.71* 1.53*  CALCIUM 8.4* 8.5* 8.2* 8.5*  MG  --  2.0 2.0 2.1  PHOS  --  3.9 1.7* 3.4    CBC Recent Labs  Lab 07/23/20 1734 07/24/20 0846 07/25/20 0543  HGB 14.3 14.0 12.9*  HCT 43.9 44.2 40.0  WBC 5.7 8.5 11.1*  PLT 220 217 212    Anti-Coagulation No results for input(s): INR in the last 168 hours.  Discharge Instructions     (HEART FAILURE PATIENTS) Call MD:  Anytime you have any of the following symptoms: 1) 3 pound weight gain in 24 hours or 5 pounds in 1 week 2) shortness of breath, with or without a dry hacking cough 3) swelling in the hands, feet or stomach 4) if you have to sleep on extra pillows at night in order to breathe.   Complete by: As directed    AMB referral to CHF clinic   Complete by: As directed    Call MD for:   Complete by: As directed    Call MD for:  difficulty breathing, headache or visual disturbances   Complete by: As directed    Call MD for:  extreme fatigue   Complete by: As directed    Call MD for:  hives   Complete by: As directed    Call MD for:  persistant dizziness or light-headedness   Complete by: As directed    Call MD for:  persistant nausea and vomiting   Complete by: As directed    Call MD for:  redness, tenderness, or signs of infection (pain, swelling, redness, odor or green/yellow discharge around incision site)   Complete by: As directed    Call MD for:  severe uncontrolled pain   Complete by: As directed    Call MD for:   temperature >100.4   Complete by: As directed    Diet - low sodium heart healthy   Complete by: As directed    Increase activity slowly   Complete by: As directed          [place]. Call in 1 week(s).   Contact information: [place] [address] [phone]  Allergies as of 07/26/2020       Reactions   Penicillins Anaphylaxis, Other (See Comments)   Has patient had a PCN reaction causing immediate rash, facial/tongue/throat swelling, SOB or lightheadedness with hypotension: Yes Has patient had a PCN reaction causing severe rash involving mucus membranes or skin necrosis: No Has patient had a PCN reaction that required hospitalization: No Has patient had a PCN reaction occurring within the last 10 years: No If all of the above answers are "NO", then may proceed with Cephalosporin use.        Medication List     TAKE these medications    aspirin EC 81 MG tablet Take 81 mg by mouth daily. Swallow whole.   atorvastatin 40 MG tablet Commonly known as: LIPITOR Take 40 mg by mouth at bedtime.   Eliquis 5 MG Tabs tablet Generic drug: apixaban Take 5 mg by mouth 2 (two) times daily.   fluticasone-salmeterol 250-50 MCG/ACT Aepb Commonly known as: ADVAIR Inhale 1 puff into the lungs every 12 (twelve) hours.   furosemide 40 MG tablet Commonly known as: LASIX Take 80 mg by mouth daily.   ipratropium-albuterol 0.5-2.5 (3) MG/3ML Soln Commonly known as: DUONEB Take 3 mLs by nebulization every 4 (four) hours.   lisinopril 30 MG tablet Commonly known as: ZESTRIL Take 30 mg by mouth daily.   melatonin 5 MG Tabs Take 5 mg by mouth at bedtime.   metoprolol tartrate 100 MG tablet Commonly known as: LOPRESSOR Take 100 mg by mouth 2 (two) times daily.   NovoLOG FlexPen 100 UNIT/ML FlexPen Generic drug: insulin aspart Inject 0-16 Units into the skin in the morning, at noon, in the  evening, and at bedtime.   Potassium Chloride ER 20 MEQ Tbcr Take 20 mEq by mouth daily.   sildenafil 20 MG tablet Commonly known as: REVATIO Take 20 mg by mouth 3 (three) times daily.   theophylline 300 MG 12 hr tablet Commonly known as: THEODUR Take 300 mg by mouth 2 (two) times daily.   tiotropium 18 MCG inhalation capsule Commonly known as: SPIRIVA Place 18 mcg into inhaler and inhale daily.   Ventolin HFA 108 (90 Base) MCG/ACT inhaler Generic drug: albuterol Inhale 2 puffs into the lungs every 6 (six) hours as needed for wheezing or shortness of breath.         Disposition: Home  Discharged Condition: [name] has met maximum benefit of inpatient care and is medically stable and cleared for discharge.  Patient is pending follow up as above.      Time spent on disposition:  Greater than 45 minutes.   Rufina Falco, DNP, CCRN, FNP-C, AGACNP-BC Acute Care Nurse Practitioner  [facility] Pager: [phone] [place] [facility]

## 2020-07-26 NOTE — Progress Notes (Signed)
Pt discharged. D/C instruction given IV removed. Pt wheeled of of the unit via volunteer team.

## 2020-08-01 ENCOUNTER — Encounter: Payer: Self-pay | Admitting: Emergency Medicine

## 2020-08-01 ENCOUNTER — Emergency Department
Admission: EM | Admit: 2020-08-01 | Discharge: 2020-08-01 | Disposition: A | Discharge status: Home / Self Care | Payer: Medicare HMO | Attending: Emergency Medicine | Admitting: Emergency Medicine

## 2020-08-01 ENCOUNTER — Other Ambulatory Visit: Payer: Self-pay

## 2020-08-01 ENCOUNTER — Emergency Department: Payer: Medicare HMO

## 2020-08-01 DIAGNOSIS — Z79899 Other long term (current) drug therapy: Secondary | ICD-10-CM | POA: Insufficient documentation

## 2020-08-01 DIAGNOSIS — E1122 Type 2 diabetes mellitus with diabetic chronic kidney disease: Secondary | ICD-10-CM | POA: Diagnosis not present

## 2020-08-01 DIAGNOSIS — I480 Paroxysmal atrial fibrillation: Secondary | ICD-10-CM | POA: Diagnosis not present

## 2020-08-01 DIAGNOSIS — I130 Hypertensive heart and chronic kidney disease with heart failure and stage 1 through stage 4 chronic kidney disease, or unspecified chronic kidney disease: Secondary | ICD-10-CM | POA: Diagnosis not present

## 2020-08-01 DIAGNOSIS — Z7901 Long term (current) use of anticoagulants: Secondary | ICD-10-CM | POA: Diagnosis not present

## 2020-08-01 DIAGNOSIS — F17210 Nicotine dependence, cigarettes, uncomplicated: Secondary | ICD-10-CM | POA: Diagnosis not present

## 2020-08-01 DIAGNOSIS — N183 Chronic kidney disease, stage 3 unspecified: Secondary | ICD-10-CM | POA: Insufficient documentation

## 2020-08-01 DIAGNOSIS — J441 Chronic obstructive pulmonary disease with (acute) exacerbation: Secondary | ICD-10-CM | POA: Insufficient documentation

## 2020-08-01 DIAGNOSIS — R519 Headache, unspecified: Secondary | ICD-10-CM | POA: Diagnosis present

## 2020-08-01 DIAGNOSIS — J0120 Acute ethmoidal sinusitis, unspecified: Secondary | ICD-10-CM | POA: Insufficient documentation

## 2020-08-01 DIAGNOSIS — Z794 Long term (current) use of insulin: Secondary | ICD-10-CM | POA: Diagnosis not present

## 2020-08-01 DIAGNOSIS — I5042 Chronic combined systolic (congestive) and diastolic (congestive) heart failure: Secondary | ICD-10-CM | POA: Diagnosis not present

## 2020-08-01 DIAGNOSIS — Z7951 Long term (current) use of inhaled steroids: Secondary | ICD-10-CM | POA: Diagnosis not present

## 2020-08-01 DIAGNOSIS — Z7982 Long term (current) use of aspirin: Secondary | ICD-10-CM | POA: Diagnosis not present

## 2020-08-01 MED ORDER — PHENYLEPHRINE HCL 10 MG PO TABS
10.0000 mg | ORAL_TABLET | Freq: Once | ORAL | Status: AC
  Administered 2020-08-01: 10 mg via ORAL
  Filled 2020-08-01: qty 1

## 2020-08-01 NOTE — ED Notes (Signed)
Patient given sandwich tray and juice

## 2020-08-01 NOTE — ED Notes (Signed)
Pt changed from personal O2 concentrator to O2 tank, pt requesting to be placed on 1L via [place] instead of his baseline 2L due to 2L drying out his nose.

## 2020-08-01 NOTE — ED Provider Notes (Signed)
[facility] Emergency Department Provider Note   ____________________________________________   Event Date/Time   First MD Initiated Contact with Patient 08/01/20 [phone]     (approximate)  I have reviewed the triage vital signs and the nursing notes.   HISTORY  Chief Complaint Headache    HPI [name] is a 50 y.o. male with below stated past medical history presents for 4 days of right-sided headache with associated nasal congestion.  Patient states that this pain has been 9/10, and radiating towards the upper scalp and back of his head.  Patient states that palpation on the front of the face and or blowing his nose worsens this pain.  Patient states that he used a sinus rinse yesterday with a short time of improved symptoms.  Patient has not tried any other medications for this pain.  Patient denies any other exacerbating or relieving factors.  Patient states the pain has remained stable since onset.  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) 01/01/2020   CKD stage 3 due to type 2 diabetes mellitus ([place]) 01/01/2020   Respiratory failure with hypercapnia ([place]) 07/31/2019   Acute on chronic respiratory failure with hypoxia ([place]) 06/10/2019   Acute on chronic respiratory failure with hypoxia and hypercapnia (HCC) 11/17/2018   Acute exacerbation of CHF (congestive heart failure) ([place]) 11/07/2018   Chest pain 11/06/2018   Acute exacerbation of chronic obstructive pulmonary disease (COPD) ([place]) 07/29/2018   COPD with acute exacerbation ([place]) 02/03/2018   COPD (chronic obstructive pulmonary disease) ([place]) 07/16/2016   Chronic diastolic heart failure ([place]) 03/23/2016   Hypercarbia    COPD exacerbation ([place]) 11/17/2015   HTN (hypertension) 06/14/2015   Dental caries 03/08/2015   Morbid obesity ([place]) 02/21/2015   OSA and COPD overlap syndrome ([place]) 02/21/2015   Thrombocytopenia ([place]) 02/21/2015   Hypernatremia 02/21/2015   Type 2 diabetes mellitus ([place]) 10/19/2014   Chronic combined systolic and diastolic congestive heart failure ([place]) 10/18/2014   Chronic obstructive  pulmonary disease ([place]) 10/18/2014   Sarcoidosis 04/22/2013   Hyperlipidemia 04/22/2013    Past Surgical History:  Procedure Laterality Date   APPENDECTOMY     PARTIAL NEPHRECTOMY     TRACHEOSTOMY TUBE PLACEMENT N/A 01/14/2020   Procedure: TRACHEOSTOMY;  Surgeon: Clyde Canterbury, MD;  Location: [facility] ORS;  Service: ENT;  Laterality: N/A;    Prior to Admission medications   Medication Sig Start Date End Date  Taking? Authorizing Provider  aspirin EC 81 MG tablet Take 81 mg by mouth daily. Swallow whole.    [provider]  atorvastatin (LIPITOR) 40 MG tablet Take 40 mg by mouth at bedtime. 04/15/20   [provider]  ELIQUIS 5 MG TABS tablet Take 5 mg by mouth 2 (two) times daily. 07/11/20   [provider]  fluticasone-salmeterol (ADVAIR) 250-50 MCG/ACT AEPB Inhale 1 puff into the lungs every 12 (twelve) hours. 05/23/20   [provider]  furosemide (LASIX) 40 MG tablet Take 80 mg by mouth daily.    [provider]  ipratropium-albuterol (DUONEB) 0.5-2.5 (3) MG/3ML SOLN Take 3 mLs by nebulization every 4 (four) hours. 07/12/20   [provider]  lisinopril (ZESTRIL) 30 MG tablet Take 30 mg by mouth daily. 07/04/20   [provider]  melatonin 5 MG TABS Take 5 mg by mouth at bedtime.    [provider]  metoprolol tartrate (LOPRESSOR) 100 MG tablet Take 100 mg by mouth 2 (two) times daily.    [provider]  NOVOLOG FLEXPEN 100 UNIT/ML FlexPen Inject 0-16 Units into the skin in the morning, at noon, in the evening, and at bedtime. 02/19/20   [provider]  Potassium Chloride ER 20 MEQ TBCR Take 20 mEq by mouth daily. 04/05/20   [provider]  sildenafil (REVATIO) 20 MG tablet Take 20 mg by mouth 3 (three) times daily.    [provider]  theophylline (THEODUR) 300 MG 12 hr tablet Take 300 mg by mouth 2 (two) times daily. 04/08/20   [provider]  tiotropium (SPIRIVA) 18 MCG inhalation capsule Place 18 mcg into inhaler and inhale daily.    [provider]  VENTOLIN HFA 108 (90 Base) MCG/ACT inhaler Inhale 2 puffs into the lungs every 6 (six) hours as needed for wheezing or shortness of breath. 05/25/20   [provider]    Allergies Penicillins  Family History  Problem Relation Age of Onset   Hypertension Mother    Heart disease Mother    Diabetes Mother    Cancer  Father     Social History Social History   Tobacco Use   Smoking status: Every Day    Years: 25.00    Pack years: 0.00    Types: Cigarettes   Smokeless tobacco: Never  Vaping Use   Vaping Use: Never used  Substance Use Topics   Alcohol use: Yes    Comment: occassional   Drug use: Yes    Types: Marijuana, PCP    Review of Systems Constitutional: No fever/chills Eyes: No visual changes. ENT: No sore throat. Cardiovascular: Denies chest pain. Respiratory: Denies shortness of breath. Gastrointestinal: No abdominal pain.  No nausea, no vomiting.  No diarrhea. Genitourinary: Negative for dysuria. Musculoskeletal: Negative for acute arthralgias Skin: Negative for rash. Neurological: Positive for headaches, negative for weakness/numbness/paresthesias in any extremity Psychiatric: Negative for suicidal ideation/homicidal ideation   ____________________________________________   PHYSICAL EXAM:  VITAL SIGNS: ED Triage Vitals  Enc Vitals Group     BP  08/01/20 0607 (!) 142/94     Pulse Rate 08/01/20 0607 86     Resp 08/01/20 0607 (!) 22     Temp 08/01/20 0607 98.3 F (36.8 C)     Temp Source 08/01/20 0607 Oral     SpO2 08/01/20 0607 91 %     Weight 08/01/20 0608 277 lb 12.5 oz (126 kg)     Height 08/01/20 0608 6\' 1"  (1.854 m)     Head Circumference --      Peak Flow --      Pain Score 08/01/20 0606 8     Pain Loc --      Pain Edu? --      Excl. in [place]? --    Constitutional: Alert and oriented. Well appearing overweight middle-aged African-American male in no acute distress. Eyes: Conjunctivae are normal. PERRL. Head: Atraumatic. Nose: Clear congestion/rhinnorhea.  Tenderness to palpation over right forehead and ethmoid sinus Mouth/Throat: Mucous membranes are moist. Neck: No stridor.  Tracheostomy in place Cardiovascular: Grossly normal heart sounds.  Good peripheral circulation. Respiratory: Normal respiratory effort.  No retractions. Gastrointestinal: Soft and  nontender. No distention. Musculoskeletal: No obvious deformities Neurologic:  Normal speech and language. No gross focal neurologic deficits are appreciated. Skin:  Skin is warm and dry. No rash noted. Psychiatric: Mood and affect are normal. Speech and behavior are normal.  ____________________________________________   LABS (all labs ordered are listed, but only abnormal results are displayed)  Labs Reviewed - No data to display  PROCEDURES  Procedure(s) performed (including Critical Care):  .1-3 Lead EKG Interpretation  Date/Time: 08/01/2020 9:12 AM Performed by: Naaman Plummer, MD Authorized by: Naaman Plummer, MD     Interpretation: normal     ECG rate:  85   ECG rate assessment: normal     Rhythm: sinus rhythm     Ectopy: none     Conduction: normal     ____________________________________________   INITIAL IMPRESSION / ASSESSMENT AND PLAN / ED COURSE  As part of my medical decision making, I reviewed the following data within the electronic medical record, if available:  Nursing notes reviewed and incorporated, Labs reviewed, EKG interpreted, Old chart reviewed, Radiograph reviewed and Notes from prior ED visits reviewed and incorporated        Otherwise healthy patient presenting with constellation of symptoms likely representing uncomplicated viral upper respiratory symptoms as characterized by mild sinusitis  Unlikely PTA/RPA: no hot potato voice, no uvular deviation, Unlikely Esophageal rupture: No history of dysphagia Unlikely deep space infection/Lud[place]igs Low suspicion for CNS infection bacterial sinusitis, or pneumonia given exam and history.  Unlikely Strep or EBV as centor negative and with no pharyngeal exudate, posterior LAD, or splenomegaly.  Will attempt to alleviate symptoms conservatively; no overt indications at this time for antibiotics. No respiratory distress, otherwise relatively well appearing and nontoxic. Will discuss prompt follow up  with PMD and strict return precautions.      ____________________________________________   FINAL CLINICAL IMPRESSION(S) / ED DIAGNOSES  Final diagnoses:  Acute non-recurrent ethmoidal sinusitis     ED Discharge Orders     None        Note:  This document was prepared using Dragon voice recognition software and may include unintentional dictation errors.    Naaman Plummer, MD 08/01/20 [phone]

## 2020-08-01 NOTE — ED Notes (Signed)
Trach present

## 2020-08-01 NOTE — ED Triage Notes (Signed)
Pt to ED via POV with c/o HA that starts to R inner eye/nose, pt states yesterday did a sinus rinse and pain was relieved, pain returned and did another sinus rinse without relief.

## 2020-08-31 ENCOUNTER — Telehealth (INDEPENDENT_AMBULATORY_CARE_PROVIDER_SITE_OTHER): Payer: Self-pay | Admitting: Gastroenterology

## 2020-08-31 DIAGNOSIS — Z1211 Encounter for screening for malignant neoplasm of colon: Secondary | ICD-10-CM

## 2020-08-31 MED ORDER — PEG 3350-KCL-NA BICARB-NACL 420 G PO SOLR: 4000.0000 mL | Freq: Once | ORAL | 0 refills | Status: AC

## 2020-08-31 NOTE — Progress Notes (Signed)
Gastroenterology Pre-Procedure Review  Request Date: 09/27/20 Requesting Physician: Dr. Vicente Males  PATIENT REVIEW QUESTIONS: The patient responded to the following health history questions as indicated:    1. Are you having any GI issues? no 2. Do you have a personal history of Polyps? no 3. Do you have a family history of Colon Cancer or Polyps? no 4. Diabetes Mellitus? yes (Type II) 5. Joint replacements in the past 12 months?no 6. Major health problems in the past 3 months?yes (acute non-recurrent ethmoidal sinusitis) Tracheostomy 2021. 7. Any artificial heart valves, MVP, or defibrillator?no    MEDICATIONS & ALLERGIES:    Patient reports the following regarding taking any anticoagulation/antiplatelet therapy:   Plavix, Coumadin, Eliquis, Xarelto, Lovenox, Pradaxa, Brilinta, or Effient? yes (Eliquis '5mg'$ ) Aspirin? yes (81 mg)  Patient confirms/reports the following medications:  Current Outpatient Medications  Medication Sig Dispense Refill   aspirin EC 81 MG tablet Take 81 mg by mouth daily. Swallow whole.     atorvastatin (LIPITOR) 40 MG tablet Take 40 mg by mouth at bedtime.     ELIQUIS 5 MG TABS tablet Take 5 mg by mouth 2 (two) times daily.     fluticasone-salmeterol (ADVAIR) 250-50 MCG/ACT AEPB Inhale 1 puff into the lungs every 12 (twelve) hours.     furosemide (LASIX) 40 MG tablet Take 80 mg by mouth daily.     ipratropium-albuterol (DUONEB) 0.5-2.5 (3) MG/3ML SOLN Take 3 mLs by nebulization every 4 (four) hours.     lisinopril (ZESTRIL) 30 MG tablet Take 30 mg by mouth daily.     melatonin 5 MG TABS Take 5 mg by mouth at bedtime.     metoprolol tartrate (LOPRESSOR) 100 MG tablet Take 100 mg by mouth 2 (two) times daily.     NOVOLOG FLEXPEN 100 UNIT/ML FlexPen Inject 0-16 Units into the skin in the morning, at noon, in the evening, and at bedtime.     Potassium Chloride ER 20 MEQ TBCR Take 20 mEq by mouth daily.     sildenafil (REVATIO) 20 MG tablet Take 20 mg by mouth 3  (three) times daily.     theophylline (THEODUR) 300 MG 12 hr tablet Take 300 mg by mouth 2 (two) times daily.     tiotropium (SPIRIVA) 18 MCG inhalation capsule Place 18 mcg into inhaler and inhale daily.     VENTOLIN HFA 108 (90 Base) MCG/ACT inhaler Inhale 2 puffs into the lungs every 6 (six) hours as needed for wheezing or shortness of breath.     No current facility-administered medications for this visit.    Patient confirms/reports the following allergies:  Allergies  Allergen Reactions   Penicillins Anaphylaxis and Other (See Comments)    Has patient had a PCN reaction causing immediate rash, facial/tongue/throat swelling, SOB or lightheadedness with hypotension: Yes Has patient had a PCN reaction causing severe rash involving mucus membranes or skin necrosis: No Has patient had a PCN reaction that required hospitalization: No Has patient had a PCN reaction occurring within the last 10 years: No If all of the above answers are "NO", then may proceed with Cephalosporin use.     No orders of the defined types were placed in this encounter.   AUTHORIZATION INFORMATION Primary Insurance: 1D#: Group #:  Secondary Insurance: 1D#: Group #:  SCHEDULE INFORMATION: Date: 09/27/20 Time: Location: [place]

## 2020-09-01 ENCOUNTER — Emergency Department: Payer: Medicare HMO

## 2020-09-01 ENCOUNTER — Inpatient Hospital Stay
Admission: EM | Admit: 2020-09-01 | Discharge: 2020-09-14 | DRG: 207 | Disposition: A | Discharge status: Home / Self Care | Payer: Medicare HMO | Attending: Internal Medicine | Admitting: Internal Medicine

## 2020-09-01 ENCOUNTER — Ambulatory Visit: Payer: Medicare HMO | Admitting: Family

## 2020-09-01 DIAGNOSIS — I5023 Acute on chronic systolic (congestive) heart failure: Secondary | ICD-10-CM | POA: Diagnosis present

## 2020-09-01 DIAGNOSIS — J439 Emphysema, unspecified: Secondary | ICD-10-CM | POA: Diagnosis not present

## 2020-09-01 DIAGNOSIS — I2510 Atherosclerotic heart disease of native coronary artery without angina pectoris: Secondary | ICD-10-CM | POA: Diagnosis present

## 2020-09-01 DIAGNOSIS — E872 Acidosis: Secondary | ICD-10-CM | POA: Diagnosis not present

## 2020-09-01 DIAGNOSIS — E1169 Type 2 diabetes mellitus with other specified complication: Secondary | ICD-10-CM

## 2020-09-01 DIAGNOSIS — Z20822 Contact with and (suspected) exposure to covid-19: Secondary | ICD-10-CM | POA: Diagnosis present

## 2020-09-01 DIAGNOSIS — J9622 Acute and chronic respiratory failure with hypercapnia: Secondary | ICD-10-CM | POA: Diagnosis present

## 2020-09-01 DIAGNOSIS — Z880 Allergy status to penicillin: Secondary | ICD-10-CM | POA: Diagnosis not present

## 2020-09-01 DIAGNOSIS — N1831 Chronic kidney disease, stage 3a: Secondary | ICD-10-CM | POA: Diagnosis present

## 2020-09-01 DIAGNOSIS — E669 Obesity, unspecified: Secondary | ICD-10-CM

## 2020-09-01 DIAGNOSIS — Z6835 Body mass index (BMI) 35.0-35.9, adult: Secondary | ICD-10-CM | POA: Diagnosis not present

## 2020-09-01 DIAGNOSIS — J9602 Acute respiratory failure with hypercapnia: Secondary | ICD-10-CM

## 2020-09-01 DIAGNOSIS — Z8673 Personal history of transient ischemic attack (TIA), and cerebral infarction without residual deficits: Secondary | ICD-10-CM | POA: Diagnosis not present

## 2020-09-01 DIAGNOSIS — I480 Paroxysmal atrial fibrillation: Secondary | ICD-10-CM | POA: Diagnosis present

## 2020-09-01 DIAGNOSIS — N189 Chronic kidney disease, unspecified: Secondary | ICD-10-CM | POA: Diagnosis not present

## 2020-09-01 DIAGNOSIS — Z905 Acquired absence of kidney: Secondary | ICD-10-CM | POA: Diagnosis not present

## 2020-09-01 DIAGNOSIS — Z85528 Personal history of other malignant neoplasm of kidney: Secondary | ICD-10-CM

## 2020-09-01 DIAGNOSIS — G4733 Obstructive sleep apnea (adult) (pediatric): Secondary | ICD-10-CM | POA: Diagnosis not present

## 2020-09-01 DIAGNOSIS — E7800 Pure hypercholesterolemia, unspecified: Secondary | ICD-10-CM | POA: Diagnosis present

## 2020-09-01 DIAGNOSIS — N179 Acute kidney failure, unspecified: Secondary | ICD-10-CM | POA: Diagnosis present

## 2020-09-01 DIAGNOSIS — Z7901 Long term (current) use of anticoagulants: Secondary | ICD-10-CM | POA: Diagnosis not present

## 2020-09-01 DIAGNOSIS — I252 Old myocardial infarction: Secondary | ICD-10-CM

## 2020-09-01 DIAGNOSIS — Z79899 Other long term (current) drug therapy: Secondary | ICD-10-CM

## 2020-09-01 DIAGNOSIS — Z930 Tracheostomy status: Secondary | ICD-10-CM | POA: Diagnosis not present

## 2020-09-01 DIAGNOSIS — E785 Hyperlipidemia, unspecified: Secondary | ICD-10-CM | POA: Diagnosis not present

## 2020-09-01 DIAGNOSIS — E662 Morbid (severe) obesity with alveolar hypoventilation: Secondary | ICD-10-CM | POA: Diagnosis present

## 2020-09-01 DIAGNOSIS — F17210 Nicotine dependence, cigarettes, uncomplicated: Secondary | ICD-10-CM | POA: Diagnosis present

## 2020-09-01 DIAGNOSIS — J9621 Acute and chronic respiratory failure with hypoxia: Secondary | ICD-10-CM | POA: Diagnosis present

## 2020-09-01 DIAGNOSIS — Z809 Family history of malignant neoplasm, unspecified: Secondary | ICD-10-CM

## 2020-09-01 DIAGNOSIS — E1122 Type 2 diabetes mellitus with diabetic chronic kidney disease: Secondary | ICD-10-CM | POA: Diagnosis present

## 2020-09-01 DIAGNOSIS — D860 Sarcoidosis of lung: Secondary | ICD-10-CM | POA: Diagnosis present

## 2020-09-01 DIAGNOSIS — Z833 Family history of diabetes mellitus: Secondary | ICD-10-CM

## 2020-09-01 DIAGNOSIS — I130 Hypertensive heart and chronic kidney disease with heart failure and stage 1 through stage 4 chronic kidney disease, or unspecified chronic kidney disease: Secondary | ICD-10-CM | POA: Diagnosis present

## 2020-09-01 DIAGNOSIS — J441 Chronic obstructive pulmonary disease with (acute) exacerbation: Principal | ICD-10-CM | POA: Diagnosis present

## 2020-09-01 DIAGNOSIS — J9811 Atelectasis: Secondary | ICD-10-CM | POA: Diagnosis present

## 2020-09-01 DIAGNOSIS — Z8249 Family history of ischemic heart disease and other diseases of the circulatory system: Secondary | ICD-10-CM

## 2020-09-01 DIAGNOSIS — I5022 Chronic systolic (congestive) heart failure: Secondary | ICD-10-CM

## 2020-09-01 DIAGNOSIS — J9601 Acute respiratory failure with hypoxia: Secondary | ICD-10-CM | POA: Diagnosis not present

## 2020-09-01 DIAGNOSIS — Z7982 Long term (current) use of aspirin: Secondary | ICD-10-CM

## 2020-09-01 DIAGNOSIS — Z9981 Dependence on supplemental oxygen: Secondary | ICD-10-CM

## 2020-09-01 DIAGNOSIS — R9389 Abnormal findings on diagnostic imaging of other specified body structures: Secondary | ICD-10-CM

## 2020-09-01 LAB — BLOOD GAS, ARTERIAL
Acid-Base Excess: 6.8 mmol/L — ABNORMAL HIGH (ref 0.0–2.0)
Bicarbonate: 34.8 mmol/L — ABNORMAL HIGH (ref 20.0–28.0)
FIO2: 0.35
MECHVT: 500 mL
Mechanical Rate: 20
O2 Saturation: 93.2 %
PEEP: 5 cmH2O
Patient temperature: 37
pCO2 arterial: 63 mmHg — ABNORMAL HIGH (ref 32.0–48.0)
pH, Arterial: 7.35 (ref 7.350–7.450)
pO2, Arterial: 71 mmHg — ABNORMAL LOW (ref 83.0–108.0)

## 2020-09-01 LAB — PROCALCITONIN: Procalcitonin: <0.1 ng/mL

## 2020-09-01 LAB — TROPONIN I (HIGH SENSITIVITY)
Troponin I (High Sensitivity): 64 ng/L — ABNORMAL HIGH (ref <18)
Troponin I (High Sensitivity): 66 ng/L — ABNORMAL HIGH (ref <18)

## 2020-09-01 LAB — BASIC METABOLIC PANEL
Anion gap: 7 (ref 5–15)
BUN: 17 mg/dL (ref 6–20)
CO2: 33 mmol/L — ABNORMAL HIGH (ref 22–32)
Calcium: 8.6 mg/dL — ABNORMAL LOW (ref 8.9–10.3)
Chloride: 102 mmol/L (ref 98–111)
Creatinine, Ser: 1.26 mg/dL — ABNORMAL HIGH (ref 0.61–1.24)
GFR, Estimated: >60 mL/min (ref >60)
Glucose, Bld: 129 mg/dL — ABNORMAL HIGH (ref 70–99)
Potassium: 4.4 mmol/L (ref 3.5–5.1)
Sodium: 142 mmol/L (ref 135–145)

## 2020-09-01 LAB — LACTIC ACID, PLASMA: Lactic Acid, Venous: 1.5 mmol/L (ref 0.5–1.9)

## 2020-09-01 LAB — CBC
HCT: 48 % (ref 39.0–52.0)
Hemoglobin: 15.2 g/dL (ref 13.0–17.0)
MCH: 28.9 pg (ref 26.0–34.0)
MCHC: 31.7 g/dL (ref 30.0–36.0)
MCV: 91.3 fL (ref 80.0–100.0)
Platelets: 197 10*3/uL (ref 150–400)
RBC: 5.26 MIL/uL (ref 4.22–5.81)
RDW: 13.4 % (ref 11.5–15.5)
WBC: 7.1 10*3/uL (ref 4.0–10.5)
nRBC: 0 % (ref 0.0–0.2)

## 2020-09-01 LAB — GLUCOSE, CAPILLARY: Glucose-Capillary: 113 mg/dL — ABNORMAL HIGH (ref 70–99)

## 2020-09-01 LAB — RESP PANEL BY RT-PCR (FLU A&B, COVID) ARPGX2
Influenza A by PCR: NEGATIVE
Influenza B by PCR: NEGATIVE
SARS Coronavirus 2 by RT PCR: NEGATIVE

## 2020-09-01 LAB — BRAIN NATRIURETIC PEPTIDE: B Natriuretic Peptide: 113.9 pg/mL — ABNORMAL HIGH (ref 0.0–100.0)

## 2020-09-01 LAB — MRSA NEXT GEN BY PCR, NASAL: MRSA by PCR Next Gen: NOT DETECTED

## 2020-09-01 MED ORDER — METOPROLOL TARTRATE 50 MG PO TABS: 100.0000 mg | ORAL_TABLET | Freq: Two times a day (BID) | ORAL | Status: DC

## 2020-09-01 MED ORDER — ATORVASTATIN CALCIUM 20 MG PO TABS
40.0000 mg | ORAL_TABLET | Freq: Every day | ORAL | Status: DC
  Administered 2020-09-02 – 2020-09-13 (×12): 40 mg via ORAL
  Filled 2020-09-01 (×12): qty 2

## 2020-09-01 MED ORDER — METOPROLOL TARTRATE 50 MG PO TABS
100.0000 mg | ORAL_TABLET | Freq: Every day | ORAL | Status: DC
  Administered 2020-09-03 – 2020-09-06 (×4): 100 mg via ORAL
  Filled 2020-09-01 (×4): qty 2

## 2020-09-01 MED ORDER — POLYETHYLENE GLYCOL 3350 17 G PO PACK: 17.0000 g | PACK | Freq: Every day | ORAL | Status: DC | PRN

## 2020-09-01 MED ORDER — MELATONIN 5 MG PO TABS
5.0000 mg | ORAL_TABLET | Freq: Every day | ORAL | Status: DC
  Administered 2020-09-02 – 2020-09-13 (×9): 5 mg via ORAL
  Filled 2020-09-01 (×11): qty 1

## 2020-09-01 MED ORDER — BUDESONIDE 0.5 MG/2ML IN SUSP
2.0000 mg | Freq: Two times a day (BID) | RESPIRATORY_TRACT | Status: DC
  Administered 2020-09-02: 0.5 mg via RESPIRATORY_TRACT
  Filled 2020-09-01: qty 8

## 2020-09-01 MED ORDER — METHYLPREDNISOLONE SODIUM SUCC 125 MG IJ SOLR
125.0000 mg | Freq: Once | INTRAMUSCULAR | Status: AC
  Administered 2020-09-01: 125 mg via INTRAVENOUS
  Filled 2020-09-01: qty 2

## 2020-09-01 MED ORDER — IPRATROPIUM-ALBUTEROL 0.5-2.5 (3) MG/3ML IN SOLN
3.0000 mL | Freq: Four times a day (QID) | RESPIRATORY_TRACT | Status: DC
  Administered 2020-09-02 – 2020-09-11 (×35): 3 mL via RESPIRATORY_TRACT
  Filled 2020-09-01 (×35): qty 3

## 2020-09-01 MED ORDER — BUDESONIDE 0.5 MG/2ML IN SUSP: 2.0000 mg | Freq: Four times a day (QID) | RESPIRATORY_TRACT | Status: DC

## 2020-09-01 MED ORDER — APIXABAN 5 MG PO TABS
5.0000 mg | ORAL_TABLET | Freq: Two times a day (BID) | ORAL | Status: DC
  Administered 2020-09-02 – 2020-09-06 (×8): 5 mg via ORAL
  Filled 2020-09-01 (×8): qty 1

## 2020-09-01 MED ORDER — CHLORHEXIDINE GLUCONATE CLOTH 2 % EX PADS
6.0000 | MEDICATED_PAD | Freq: Every day | CUTANEOUS | Status: DC
  Administered 2020-09-01: 6 via TOPICAL
  Filled 2020-09-01: qty 6

## 2020-09-01 MED ORDER — ASPIRIN EC 81 MG PO TBEC
81.0000 mg | DELAYED_RELEASE_TABLET | Freq: Every day | ORAL | Status: DC
  Administered 2020-09-03 – 2020-09-14 (×12): 81 mg via ORAL
  Filled 2020-09-01 (×12): qty 1

## 2020-09-01 MED ORDER — ARFORMOTEROL TARTRATE 15 MCG/2ML IN NEBU
15.0000 ug | INHALATION_SOLUTION | Freq: Two times a day (BID) | RESPIRATORY_TRACT | Status: DC
  Administered 2020-09-02 – 2020-09-13 (×22): 15 ug via RESPIRATORY_TRACT
  Filled 2020-09-01 (×26): qty 2

## 2020-09-01 MED ORDER — IPRATROPIUM-ALBUTEROL 0.5-2.5 (3) MG/3ML IN SOLN
9.0000 mL | Freq: Once | RESPIRATORY_TRACT | Status: AC
  Administered 2020-09-01: 9 mL via RESPIRATORY_TRACT
  Filled 2020-09-01: qty 9

## 2020-09-01 MED ORDER — DOCUSATE SODIUM 100 MG PO CAPS: 100.0000 mg | ORAL_CAPSULE | Freq: Two times a day (BID) | ORAL | Status: DC | PRN

## 2020-09-01 NOTE — ED Triage Notes (Signed)
Pt presents to the ED via EMS from home with c/o shortness of breath that began last night and worsened today. Pt reports extensive cardiac and pulmonary hx with trach in place upon arrival. Pt reports chronic use of 2LNC

## 2020-09-01 NOTE — Progress Notes (Signed)
Patient noted with desaturation to 85 while on vent. BBS rhonci throughout. Trach suctioned for moderate white to tan thick secretions. O2 increased to 40%

## 2020-09-01 NOTE — ED Provider Notes (Signed)
[facility] Emergency Department Provider Note   ____________________________________________   Event Date/Time   First MD Initiated Contact with Patient 09/01/20 1753     (approximate)  I have reviewed the triage vital signs and the nursing notes.   HISTORY  Chief Complaint Shortness of Breath    HPI [name] is a 50 y.o. male with past medical history of hypertension, hyperlipidemia, diabetes, CKD, CHF, COPD, and atrial fibrillation on Eliquis who presents to the ED complaining of shortness of breath.  History is limited due to patient's altered mental status and majority of history is obtained from patient's wife.  She states that patient has complained of Increasing Fatigue and Difficulty Breathing over the past Couple of Days.  He seems to have rapidly declined over the course of the day today, now appears much sleepier than usual.  Wife reports that patient has not had any fevers or complained of any pain in his chest.  She states that he presented very similarly last month, when he needed to be put back on the vent via his tracheostomy.     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[facility])     Patient Active Problem List   Diagnosis Date Noted   Acute on chronic respiratory failure with  hypoxemia ([place]) 09/01/2020   Paroxysmal atrial fibrillation ([place]) 07/24/2020   Acute on chronic respiratory failure with hypercapnia (HCC)    COPD, severe (HCC)    Sarcoidosis, lung ([place])    Obstructive sleep apnea    Tracheostomy status ([place])    Acute kidney failure, unspecified ([place])    Acute and chr resp failure, unsp w hypoxia or hypercapnia ([place]) 01/02/2020   Acute respiratory failure with hypoxia and hypercapnia ([place]) 01/01/2020   CKD stage 3 due to type 2 diabetes mellitus ([place]) 01/01/2020   Respiratory failure with hypercapnia ([place]) 07/31/2019   Acute on chronic respiratory failure with hypoxia ([place]) 06/10/2019   Acute on chronic respiratory failure with hypoxia and hypercapnia (HCC) 11/17/2018   Acute exacerbation of CHF (congestive heart failure) ([place]) 11/07/2018   Chest pain 11/06/2018   Acute exacerbation of chronic obstructive pulmonary disease (COPD) ([place]) 07/29/2018   COPD with acute exacerbation ([place]) 02/03/2018   COPD (chronic obstructive pulmonary disease) ([place]) 07/16/2016   Chronic diastolic heart failure ([place]) 03/23/2016   Hypercarbia    COPD exacerbation ([place]) 11/17/2015   HTN (hypertension) 06/14/2015   Dental caries 03/08/2015   Morbid obesity ([place]) 02/21/2015   OSA and COPD overlap syndrome ([place]) 02/21/2015   Thrombocytopenia ([place]) 02/21/2015   Hypernatremia 02/21/2015   Type 2 diabetes mellitus ([place]) 10/19/2014   Chronic combined systolic and diastolic congestive heart failure ([place]) 10/18/2014  Chronic obstructive pulmonary disease ([place]) 10/18/2014   Sarcoidosis 04/22/2013   Hyperlipidemia 04/22/2013    Past Surgical History:  Procedure Laterality Date   APPENDECTOMY     PARTIAL NEPHRECTOMY     TRACHEOSTOMY TUBE PLACEMENT N/A 01/14/2020   Procedure: TRACHEOSTOMY;  Surgeon: Clyde Canterbury, MD;  Location: [facility] ORS;  Service: ENT;  Laterality: N/A;    Prior to Admission medications   Medication Sig Start Date End Date Taking? Authorizing Provider   VENTOLIN HFA 108 (90 Base) MCG/ACT inhaler Inhale 2 puffs into the lungs every 6 (six) hours as needed for wheezing or shortness of breath. 05/25/20  Yes [provider]  aspirin EC 81 MG tablet Take 81 mg by mouth daily. Swallow whole.    [provider]  atorvastatin (LIPITOR) 40 MG tablet Take 40 mg by mouth at bedtime. 04/15/20   [provider]  ELIQUIS 5 MG TABS tablet Take 5 mg by mouth 2 (two) times daily. 07/11/20   [provider]  fluticasone-salmeterol (ADVAIR) 250-50 MCG/ACT AEPB Inhale 1 puff into the lungs every 12 (twelve) hours. 05/23/20   [provider]  furosemide (LASIX) 40 MG tablet Take 80 mg by mouth daily.    [provider]  ipratropium-albuterol (DUONEB) 0.5-2.5 (3) MG/3ML SOLN Take 3 mLs by nebulization every 4 (four) hours. 07/12/20   [provider]  lisinopril (ZESTRIL) 30 MG tablet Take 30 mg by mouth daily. 07/04/20   [provider]  melatonin 5 MG TABS Take 5 mg by mouth at bedtime.    [provider]  metoprolol tartrate (LOPRESSOR) 100 MG tablet Take 100 mg by mouth 2 (two) times daily.    [provider]  NOVOLOG FLEXPEN 100 UNIT/ML FlexPen Inject 0-16 Units into the skin in the morning, at noon, in the evening, and at bedtime. 02/19/20   [provider]  Potassium Chloride ER 20 MEQ TBCR Take 20 mEq by mouth daily. 04/05/20   [provider]  sildenafil (REVATIO) 20 MG tablet Take 20 mg by mouth 3 (three) times daily.    [provider]  theophylline (THEODUR) 300 MG 12 hr tablet Take 300 mg by mouth 2 (two) times daily. 04/08/20   [provider]  tiotropium (SPIRIVA) 18 MCG inhalation capsule Place 18 mcg into inhaler and inhale daily.    [provider]    Allergies Penicillins  Family History  Problem Relation Age of Onset   Hypertension Mother    Heart disease Mother    Diabetes Mother    Cancer Father     Social  History Social History   Tobacco Use   Smoking status: Every Day    Years: 25.00    Types: Cigarettes   Smokeless tobacco: Never  Vaping Use   Vaping Use: Never used  Substance Use Topics   Alcohol use: Yes    Comment: occassional   Drug use: Yes    Types: Marijuana, PCP    Review of Systems Unable to obtain secondary to altered mental status  ____________________________________________   PHYSICAL EXAM:  VITAL SIGNS: ED Triage Vitals [09/01/20 1536]  Enc Vitals Group     BP (!) 155/100     Pulse Rate 96     Resp 20     Temp 98.4 F (36.9 C)     Temp Source Oral     SpO2 95 %     Weight      Height      Head Circumference  Peak Flow      Pain Score      Pain Loc      Pain Edu?      Excl. in [place]?     Constitutional: Somnolent, arousable to sternal rub. Eyes: Conjunctivae are normal.  Pupils equal, round, and reactive to light bilaterally. Head: Atraumatic. Nose: No congestion/rhinnorhea. Mouth/Throat: Mucous membranes are moist. Neck: Normal ROM Cardiovascular: Normal rate, regular rhythm. Grossly normal heart sounds. Respiratory: Normal respiratory effort.  No retractions. Lungs with poor air movement throughout. Gastrointestinal: Soft and nontender. No distention. Genitourinary: deferred Musculoskeletal: No lower extremity tenderness nor edema. Neurologic: Somnolent, withdraws from pain in all 4 extremities. Skin:  Skin is warm, dry and intact. No rash noted. Psychiatric: Unable to assess.  ____________________________________________   LABS (all labs ordered are listed, but only abnormal results are displayed)  Labs Reviewed  BASIC METABOLIC PANEL - Abnormal; Notable for the following components:      Result Value   CO2 33 (*)    Glucose, Bld 129 (*)    Creatinine, Ser 1.26 (*)    Calcium 8.6 (*)    All other components within normal limits  BRAIN NATRIURETIC PEPTIDE - Abnormal; Notable for the following components:   B Natriuretic  Peptide 113.9 (*)    All other components within normal limits  BLOOD GAS, ARTERIAL - Abnormal; Notable for the following components:   pCO2 arterial 63 (*)    pO2, Arterial 71 (*)    Bicarbonate 34.8 (*)    Acid-Base Excess 6.8 (*)    All other components within normal limits  GLUCOSE, CAPILLARY - Abnormal; Notable for the following components:   Glucose-Capillary 113 (*)    All other components within normal limits  TROPONIN I (HIGH SENSITIVITY) - Abnormal; Notable for the following components:   Troponin I (High Sensitivity) 64 (*)    All other components within normal limits  TROPONIN I (HIGH SENSITIVITY) - Abnormal; Notable for the following components:   Troponin I (High Sensitivity) 66 (*)    All other components within normal limits  RESP PANEL BY RT-PCR (FLU A&B, COVID) ARPGX2  MRSA NEXT GEN BY PCR, NASAL  CULTURE, BLOOD (ROUTINE X 2)  CULTURE, BLOOD (ROUTINE X 2)  CULTURE, RESPIRATORY W GRAM STAIN  CBC  LACTIC ACID, PLASMA  PROCALCITONIN  BASIC METABOLIC PANEL  CBC  BLOOD GAS, ARTERIAL  MAGNESIUM  PHOSPHORUS  HIV ANTIBODY (ROUTINE TESTING W REFLEX)  LACTIC ACID, PLASMA  PROCALCITONIN  HIV ANTIBODY (ROUTINE TESTING W REFLEX)   ____________________________________________  EKG  ED ECG REPORT I, Blake Divine, the attending physician, personally viewed and interpreted this ECG.   Date: 09/01/2020  EKG Time: 15:32  Rate: 99  Rhythm: normal sinus rhythm  Axis: Normal  Intervals:right bundle branch block and left anterior fascicular block  ST&T Change: None    PROCEDURES  Procedure(s) performed (including Critical Care):  .Critical Care  Date/Time: 09/01/2020 11:57 PM Performed by: Blake Divine, MD Authorized by: Blake Divine, MD   Critical care provider statement:    Critical care time (minutes):  45   Critical care time was exclusive of:  Separately billable procedures and treating other patients and teaching time   Critical care was  necessary to treat or prevent imminent or life-threatening deterioration of the following conditions:  Respiratory failure   Critical care was time spent personally by me on the following activities:  Discussions with consultants, evaluation of patient's response to treatment, examination of patient, ordering and performing treatments  and interventions, ordering and review of laboratory studies, ordering and review of radiographic studies, pulse oximetry, re-evaluation of patient's condition, obtaining history from patient or surrogate and review of old charts   I assumed direction of critical care for this patient from another provider in my specialty: no     Care discussed with: admitting provider     ____________________________________________   INITIAL IMPRESSION / ASSESSMENT AND PLAN / ED COURSE      50 year old male with past medical history of hypertension, hyperlipidemia, diabetes, CKD, CHF, COPD, and atrial fibrillation on Eliquis who presents to the ED with increasing difficulty breathing over the past couple of days with increasing somnolence.  On my assessment, patient is difficult to arouse and wife states that he presented similarly with elevated CO2 levels during last admission.  I suspect he is again developing hypercapnic respiratory failure, his tracheostomy was switched to one that is cuffed and he was placed back on the ventilator.  His mental status seemed to gradually improve, blood gas performed after patient had been on the vent for at least an hour shows mild hypercapnic respiratory failure.  Initial chest x-ray reviewed by me and showed questionable opacity in the left lower lobe, however follow-up x-ray on vent shows improvement and I suspect this is atelectasis.  No other evidence of infection at this time, remainder of labs are unremarkable.  Case discussed with ICU provider for admission.      ____________________________________________   FINAL CLINICAL  IMPRESSION(S) / ED DIAGNOSES  Final diagnoses:  COPD exacerbation ([place])  Acute respiratory failure with hypercapnia [facility])     ED Discharge Orders     None        Note:  This document was prepared using Dragon voice recognition software and may include unintentional dictation errors.    Blake Divine, MD 09/02/20 0005

## 2020-09-01 NOTE — ED Notes (Signed)
Phlebotomy notified to draw VBG

## 2020-09-01 NOTE — H&P (Addendum)
NAME:  [name], MRN:  [id number], DOB:  1970/03/24, LOS: 0 ADMISSION DATE:  09/01/2020, CONSULTATION DATE:  09/01/2020 REFERRING MD:  Blake Divine MD. 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  Patient is currently on the vent therefore history is limited. Per EMS run sheet, no reports of associated symptoms of chest pain, fevers or chills, worsening productive cough from baseline.  ED Course: On arrival to the ED, he was afebrile with blood pressure 173/100 mm Hg and pulse rate 103 beats/min, RR 30 breaths per minute. Due to persistent hypoxia, patient was placed on the vent. He received solumedrol and Duonebs treatments as well. PCCM consulted to admit to ICU for further management.  Initial Labs/Diagnostics noted for: WBC/Hgb/Hct/Plts:  7.1/15.2/48.0/197 (07/28 1531)  Glucose 129, BUN/Cr 17/1.26 otherwise unremarkable BMP EKG: unchanged from previous tracings, normal sinus rhythm, RBBB, Qtc 505. CXR: Increased hazy opacities of the LEFT lung base. Differential considerations include infection, aspiration or atelectasis ABG: Venous/Arterial Blood Gas result:  pO2 71; pCO2 63; pH 7.35;  HCO3 4.8, %O2 Sat 93.2 Lactate: Pending  Past Medical History  HFpEF -LVEF 45 to 50% (2020), NYHA class II CKD stage IIIa Clear-cell renal cell carcinoma status post partial nephrectomy COPD with chronic trach OSA Pulmonary Sarcoidosis CVA CAD Paroxsymal atrial fibrillation -on Eliquis MI Hypertension Hyperlipidemia Type 2 diabetes mellitus Former smoker  [place] [facility] Events   7/28: admit to ICU with acute on chronic hypercapnic respiratory failure requiring ventilator support in the setting of a chronic  tracheostomy.  Consults:  PCCM  Procedures:  None  Significant Diagnostic Tests:  7/28: Chest Xray>Increased hazy opacities of the LEFT lung base. Differential considerations include infection, aspiration or atelectasis  Micro Data:  7/28: SARS-CoV-2 PCR> negative 7/28: Influenza PCR> negative 7/28: Blood culture x2> 7/28: MRSA PCR> 7/28: Tracheal aspirate>  Antimicrobials:  NONE  OBJECTIVE  Blood pressure (!) 136/94, pulse 95, temperature 98.4 F (36.9 C), temperature source Oral, resp. rate 20, SpO2 99 %.    Vent Mode: AC FiO2 (%):  [28 %-40 %] 40 % Set Rate:  [20 bmp] 20 bmp Vt Set:  [500 mL] 500 mL PEEP:  [5 cmH20] 5 cmH20  No intake or output data in the 24 hours ending 09/01/20 2033 There were no vitals filed for this visit.  Physical Examination  GENERAL:50 year-old critically ill patient lying in the bed on the vent via trach EYES: Pupils equal, round, reactive to light and accommodation. No scleral icterus. Extraocular muscles intact.  HEENT: Head atraumatic, normocephalic. Oropharynx and nasopharynx clear.  NECK:  Supple, no jugular venous distention. No thyroid enlargement, no tenderness. Trach incision intact LUNGS: Normal breath sounds bilaterally, no wheezing, rales,rhonchi or crepitation. No use of accessory muscles of respiration.  CARDIOVASCULAR: S1, S2 normal. No murmurs, rubs, or gallops.  ABDOMEN: Soft, nontender, nondistended. Bowel sounds present. No organomegaly or mass.  EXTREMITIES: No pedal edema, cyanosis, or clubbing.  NEUROLOGIC: Cranial nerves II through XII are intact.  Muscle strength 5/5 in all extremities. Sensation intact. Gait not checked.  PSYCHIATRIC: The patient is able to open his eyes and nods appropriately to orientation questions. SKIN: No obvious rash, lesion, or ulcer.   Labs/imaging that I havepersonally reviewed  (right click and "Reselect all  SmartList Selections" daily)    Labs   CBC: Recent Labs  Lab 09/01/20 1531   WBC 7.1  HGB 15.2  HCT 48.0  MCV 91.3  PLT [id number]    Basic Metabolic Panel: Recent Labs  Lab 09/01/20 1531  NA 142  K 4.4  CL 102  CO2 33*  GLUCOSE 129*  BUN 17  CREATININE 1.26*  CALCIUM 8.6*   GFR: CrCl cannot be calculated (Unknown ideal weight.). Recent Labs  Lab 09/01/20 1531  WBC 7.1    Liver Function Tests: No results for input(s): AST, ALT, ALKPHOS, BILITOT, PROT, ALBUMIN in the last 168 hours. No results for input(s): LIPASE, AMYLASE in the last 168 hours. No results for input(s): AMMONIA in the last 168 hours.  ABG    Component Value Date/Time   PHART 7.36 07/26/2020 0500   PCO2ART 65 (H) 07/26/2020 0500   PO2ART 97 07/26/2020 0500   HCO3 36.7 (H) 07/26/2020 0500   O2SAT 97.3 07/26/2020 0500     Coagulation Profile: No results for input(s): INR, PROTIME in the last 168 hours.  Cardiac Enzymes: No results for input(s): CKTOTAL, CKMB, CKMBINDEX, TROPONINI in the last 168 hours.  HbA1C: Hemoglobin A1C  Date/Time Value Ref Range Status  02/24/2013 04:54 AM 7.8 (H) 4.2 - 6.3 % Final    Comment:    The American Diabetes Association recommends that a primary goal of therapy should be <7% and that physicians should reevaluate the treatment regimen in patients with HbA1c values consistently >8%.    Hgb A1c MFr Bld  Date/Time Value Ref Range Status  01/28/2020 01:10 AM 6.6 (H) 4.8 - 5.6 % Final    Comment:    (NOTE) Pre diabetes:          5.7%-6.4%  Diabetes:              >6.4%  Glycemic control for   <7.0% adults with diabetes   01/02/2020 04:21 AM 6.5 (H) 4.8 - 5.6 % Final    Comment:    (NOTE) Pre diabetes:          5.7%-6.4%  Diabetes:              >6.4%  Glycemic control for   <7.0% adults with diabetes     CBG: No results for input(s): GLUCAP in the last 168 hours.  Review of Systems:   UNABLE TO ASSESS, PATIENT IS ON THE VENT  Past Medical History  He,  has a past medical history of Acute on chronic respiratory failure  with hypoxia (HCC), CHF (congestive heart failure) ([place]), CKD (chronic kidney disease) stage 3, GFR 30-59 ml/min (HCC), Clear cell renal cell carcinoma (HCC), COPD (chronic obstructive pulmonary disease) (HCC), COPD, severe ([place]), Coronary artery disease, Diabetes mellitus without complication ([place]), Hypercholesteremia ([place]), Hypertension, Myocardial infarction ([place]), Obstructive sleep apnea, Paroxysmal A-fib ([place]), Sarcoidosis, Sarcoidosis, lung ([place]), Sleep apnea, Stroke ([place]), and Tracheostomy status ([place]).   Surgical History    Past Surgical History:  Procedure Laterality Date   APPENDECTOMY     PARTIAL NEPHRECTOMY     TRACHEOSTOMY TUBE PLACEMENT N/A 01/14/2020   Procedure: TRACHEOSTOMY;  Surgeon: Clyde Canterbury, MD;  Location: [facility] ORS;  Service: ENT;  Laterality: N/A;     Social History   reports that he has been smoking cigarettes. He has never used smokeless tobacco. He reports current alcohol use. He reports current drug use. Drugs: Marijuana and PCP.   Family History   His family history includes Cancer in his father;  Diabetes in his mother; Heart disease in his mother; Hypertension in his mother.   Allergies Allergies  Allergen Reactions   Penicillins Anaphylaxis and Other (See Comments)    Has patient had a PCN reaction causing immediate rash, facial/tongue/throat swelling, SOB or lightheadedness with hypotension: Yes Has patient had a PCN reaction causing severe rash involving mucus membranes or skin necrosis: No Has patient had a PCN reaction that required hospitalization: No Has patient had a PCN reaction occurring within the last 10 years: No If all of the above answers are "NO", then may proceed with Cephalosporin use.      Home Medications  Prior to Admission medications   Medication Sig Start Date End Date Taking? Authorizing Provider  aspirin EC 81 MG tablet Take 81 mg by mouth daily. Swallow whole.    [provider]  atorvastatin (LIPITOR) 40 MG tablet  Take 40 mg by mouth at bedtime. 04/15/20   [provider]  ELIQUIS 5 MG TABS tablet Take 5 mg by mouth 2 (two) times daily. 07/11/20   [provider]  fluticasone-salmeterol (ADVAIR) 250-50 MCG/ACT AEPB Inhale 1 puff into the lungs every 12 (twelve) hours. 05/23/20   [provider]  furosemide (LASIX) 40 MG tablet Take 80 mg by mouth daily.    [provider]  ipratropium-albuterol (DUONEB) 0.5-2.5 (3) MG/3ML SOLN Take 3 mLs by nebulization every 4 (four) hours. 07/12/20   [provider]  lisinopril (ZESTRIL) 30 MG tablet Take 30 mg by mouth daily. 07/04/20   [provider]  melatonin 5 MG TABS Take 5 mg by mouth at bedtime.    [provider]  metoprolol tartrate (LOPRESSOR) 100 MG tablet Take 100 mg by mouth 2 (two) times daily.    [provider]  NOVOLOG FLEXPEN 100 UNIT/ML FlexPen Inject 0-16 Units into the skin in the morning, at noon, in the evening, and at bedtime. 02/19/20   [provider]  Potassium Chloride ER 20 MEQ TBCR Take 20 mEq by mouth daily. 04/05/20   [provider]  sildenafil (REVATIO) 20 MG tablet Take 20 mg by mouth 3 (three) times daily.    [provider]  theophylline (THEODUR) 300 MG 12 hr tablet Take 300 mg by mouth 2 (two) times daily. 04/08/20   [provider]  tiotropium (SPIRIVA) 18 MCG inhalation capsule Place 18 mcg into inhaler and inhale daily.    [provider]  VENTOLIN HFA 108 (90 Base) MCG/ACT inhaler Inhale 2 puffs into the lungs every 6 (six) hours as needed for wheezing or shortness of breath. 05/25/20   [provider]  Scheduled Meds:  apixaban  5 mg Oral BID   arformoterol  15 mcg Nebulization BID   aspirin EC  81 mg Oral Daily   atorvastatin  40 mg Oral QHS   budesonide (PULMICORT) nebulizer solution  2 mg Nebulization Q6H   [START ON 09/02/2020] Chlorhexidine Gluconate Cloth  6 each Topical Q0600   ipratropium-albuterol  3 mL  Nebulization Q6H   melatonin  5 mg Oral QHS   metoprolol tartrate  100 mg Oral BID   Continuous Infusions: PRN Meds:.docusate sodium, polyethylene glycol    Active Hospital Problem list   Acute on chronic hypercapnic respiratory failure Chronic diastolic CHF AKI on CKD Paroxysmal atrial fibrillation Diabetes mellitus  Assessment & Plan:  Acute on chronic Hypercapnic Respiratory Failure secondary to COPD Exacerbation and possible Aspiration? No evidence of Volume overload. Status post Trach 01/14/20 PMHx: Severe COPD  and Sarcoidosis, OSA, former smoker -Continue ventilator support & lung protective strategies -Wean PEEP & FiO2 as tolerated, maintain SpO2 > 90% -Head of bed elevated 30 degrees, VAP protocol in place -Plateau pressures less than 30 cm H20  -Intermittent chest x-ray & ABG PRN -Daily WUA with SBT as tolerated  -Ensure adequate pulmonary hygiene  -F/u cultures, trend PCT -Hold empiric coverage for now -Restart Theophyline pending levels -Budesonide nebs BID, bronchodilators PRN &Scheduled  Chronic Diastolic Congestive Heart Failure NYHA class II (Last known EF 45-50%) BNP mildly elevated. No evidence of volume overload -Hypertension Hx: CAD s/p cardiac cath, HLD  -Continuous cardiac monitoring -Maintain MAP greater than 65 -Will restart home Lasix as blood pressure and renal function permits; currently on Lasix 80 mg daily at home -Continue Lisinopril, metoprolol once able to take oral -ASA '81mg'$  PO daily -Atorvastatin '40mg'$  PO qhs -Repeat 2D Echocardiogram  Paroxysmal Atrial Fibrillation CHA2DS2-VASc score: 3 -Continue Eliquis -Continue Metoprolol for rate control once able to take po  Acute on CKD Stage III -Monitor I&O's / urinary output -Follow BMP -Ensure adequate renal perfusion -Avoid nephrotoxic agents as able -Replace electrolytes as indicated   Diabetes mellitus Type ll -CBGs -Sliding scale insulin -Follow ICU hyper/hypoglycemia  protocol  Best practice:  Diet:  NPO Pain/Anxiety/Delirium protocol (if indicated): Yes (RASS goal 0) VAP protocol (if indicated): Yes DVT prophylaxis: Systemic AC GI prophylaxis: H2B Glucose control:  SSI Yes Central venous access:  N/A Arterial line:  N/A Foley:  N/A Mobility:  bed rest  PT consulted: Yes Last date of multidisciplinary goals of care discussion [7/28] Code Status:  full code Disposition: ICU   = Goals of Care = Code Status Order: FULL  Primary Emergency Contact: [place], Home Phone: [phone] Wishes to pursue full aggressive treatment and intervention options, including CPR and intubation, goals of care will be addressed on going with family if that should become necessary.   Critical care time: 45 minutes     Rufina Falco, DNP, CCRN, FNP-C, AGACNP-BC Acute Care Nurse Practitioner  [facility] Pager: [phone] [place] at [facility]  .

## 2020-09-02 DIAGNOSIS — J9621 Acute and chronic respiratory failure with hypoxia: Secondary | ICD-10-CM | POA: Diagnosis not present

## 2020-09-02 DIAGNOSIS — J439 Emphysema, unspecified: Secondary | ICD-10-CM

## 2020-09-02 DIAGNOSIS — G4733 Obstructive sleep apnea (adult) (pediatric): Secondary | ICD-10-CM

## 2020-09-02 LAB — HIV ANTIBODY (ROUTINE TESTING W REFLEX): HIV Screen 4th Generation wRfx: NONREACTIVE

## 2020-09-02 LAB — CBC
HCT: 46.5 % (ref 39.0–52.0)
Hemoglobin: 14.6 g/dL (ref 13.0–17.0)
MCH: 28.5 pg (ref 26.0–34.0)
MCHC: 31.4 g/dL (ref 30.0–36.0)
MCV: 90.8 fL (ref 80.0–100.0)
Platelets: 187 10*3/uL (ref 150–400)
RBC: 5.12 MIL/uL (ref 4.22–5.81)
RDW: 13.5 % (ref 11.5–15.5)
WBC: 7 10*3/uL (ref 4.0–10.5)
nRBC: 0 % (ref 0.0–0.2)

## 2020-09-02 LAB — BLOOD GAS, ARTERIAL
Acid-Base Excess: 10.6 mmol/L — ABNORMAL HIGH (ref 0.0–2.0)
Bicarbonate: 37.2 mmol/L — ABNORMAL HIGH (ref 20.0–28.0)
FIO2: 0.35
MECHVT: 500 mL
Mechanical Rate: 20
O2 Saturation: 94 %
PEEP: 5 cmH2O
Patient temperature: 37
pCO2 arterial: 56 mmHg — ABNORMAL HIGH (ref 32.0–48.0)
pH, Arterial: 7.43 (ref 7.350–7.450)
pO2, Arterial: 69 mmHg — ABNORMAL LOW (ref 83.0–108.0)

## 2020-09-02 LAB — BASIC METABOLIC PANEL
Anion gap: 5 (ref 5–15)
BUN: 18 mg/dL (ref 6–20)
CO2: 36 mmol/L — ABNORMAL HIGH (ref 22–32)
Calcium: 8.6 mg/dL — ABNORMAL LOW (ref 8.9–10.3)
Chloride: 100 mmol/L (ref 98–111)
Creatinine, Ser: 1.51 mg/dL — ABNORMAL HIGH (ref 0.61–1.24)
GFR, Estimated: 56 mL/min — ABNORMAL LOW (ref >60)
Glucose, Bld: 140 mg/dL — ABNORMAL HIGH (ref 70–99)
Potassium: 5 mmol/L (ref 3.5–5.1)
Sodium: 141 mmol/L (ref 135–145)

## 2020-09-02 LAB — MAGNESIUM: Magnesium: 2 mg/dL (ref 1.7–2.4)

## 2020-09-02 LAB — LACTIC ACID, PLASMA: Lactic Acid, Venous: 1.9 mmol/L (ref 0.5–1.9)

## 2020-09-02 LAB — PHOSPHORUS: Phosphorus: 2.1 mg/dL — ABNORMAL LOW (ref 2.5–4.6)

## 2020-09-02 LAB — PROCALCITONIN: Procalcitonin: <0.1 ng/mL

## 2020-09-02 MED ORDER — THEOPHYLLINE ER 300 MG PO TB12
300.0000 mg | ORAL_TABLET | Freq: Two times a day (BID) | ORAL | Status: DC
  Administered 2020-09-02 – 2020-09-14 (×24): 300 mg via ORAL
  Filled 2020-09-02 (×26): qty 1

## 2020-09-02 MED ORDER — BUDESONIDE 0.5 MG/2ML IN SUSP
1.0000 mg | Freq: Two times a day (BID) | RESPIRATORY_TRACT | Status: DC
  Administered 2020-09-02: 1 mg via RESPIRATORY_TRACT
  Filled 2020-09-02 (×2): qty 4

## 2020-09-02 MED ORDER — SODIUM PHOSPHATES 45 MMOLE/15ML IV SOLN
30.0000 mmol | Freq: Once | INTRAVENOUS | Status: AC
  Administered 2020-09-02: 30 mmol via INTRAVENOUS
  Filled 2020-09-02: qty 10

## 2020-09-02 MED ORDER — FUROSEMIDE 10 MG/ML IJ SOLN
40.0000 mg | Freq: Every day | INTRAMUSCULAR | Status: DC
  Administered 2020-09-02 – 2020-09-06 (×5): 40 mg via INTRAVENOUS
  Filled 2020-09-02 (×6): qty 4

## 2020-09-02 NOTE — Progress Notes (Addendum)
NAME:  [name], MRN:  [id number], DOB:  10-17-1970, LOS: 1 ADMISSION DATE:  09/01/2020, CONSULTATION DATE:  09/01/2020 REFERRING MD:  Blake Divine MD. 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  Patient is currently on the vent therefore history is limited. Per EMS run sheet, no reports of associated symptoms of chest pain, fevers or chills, worsening productive cough from baseline.  ED Course: On arrival to the ED, he was afebrile with blood pressure 173/100 mm Hg and pulse rate 103 beats/min, RR 30 breaths per minute. Due to persistent hypoxia, patient was placed on the vent. He received solumedrol and Duonebs treatments as well. PCCM consulted to admit to ICU for further management.  Initial Labs/Diagnostics noted for: WBC/Hgb/Hct/Plts:  7.0/14.6/46.5/187 (07/29 0015)  Glucose 129, BUN/Cr 17/1.26 otherwise unremarkable BMP EKG: unchanged from previous tracings, normal sinus rhythm, RBBB, Qtc 505. CXR: Increased hazy opacities of the LEFT lung base. Differential considerations include infection, aspiration or atelectasis ABG: Venous/Arterial Blood Gas result:  pO2 71; pCO2 63; pH 7.35;  HCO3 4.8, %O2 Sat 93.2  Significant Hospital Events   7/28: admit to ICU with acute on chronic hypercapnic respiratory failure requiring ventilator support in the setting of a chronic tracheostomy. 7/29: Tolerated pressure support trial, placed on trach collar  Consults:  PCCM  Procedures:  None  Significant Diagnostic Tests:  7/28: Chest Xray>Increased hazy opacities of the LEFT lung base. Differential considerations include infection, aspiration or atelectasis  Micro Data:  7/28: SARS-CoV-2 PCR> negative 7/28:  Influenza PCR> negative 7/28: Blood culture x2> 7/28: MRSA PCR> 7/28: Tracheal aspirate>  Antimicrobials:  NONE  OBJECTIVE  Blood pressure (!) 135/94, pulse 89, temperature 97.7 F (36.5 C), temperature source Axillary, resp. rate (!) 36, height '6\' 1"'$  (1.854 m), weight 121.9 kg, SpO2 97 %.    Vent Mode: PSV FiO2 (%):  [28 %-40 %] 35 % Set Rate:  [20 bmp] 20 bmp Vt Set:  [500 mL] 500 mL PEEP:  [5 cmH20] 5 cmH20 Pressure Support:  [5 cmH20] 5 cmH20 Plateau Pressure:  [18 cmH20] 18 cmH20  No intake or output data in the 24 hours ending 09/02/20 1017 Filed Weights   09/01/20 2125 09/02/20 0221  Weight: 121.9 kg 121.9 kg    Physical Examination  GENERAL: Middle-age obese African-American male, lying on the bed, on the vent via trach EYES: Pupils equal, round, reactive to light and accommodation. No scleral icterus. Extraocular muscles intact.  HEENT: Head atraumatic, normocephalic. Oropharynx and nasopharynx clear.  NECK:  Supple, no jugular venous distention. No thyroid enlargement, no tenderness. Trach incision intact LUNGS: Normal breath sounds bilaterally, no wheezing, rales,rhonchi or crepitation. No use of accessory muscles of respiration.  CARDIOVASCULAR: S1, S2 normal. No murmurs, rubs, or gallops.  ABDOMEN: Soft, nontender, nondistended. Bowel sounds present. No organomegaly or mass.  EXTREMITIES: No pedal edema, cyanosis, or clubbing.  NEUROLOGIC: Cranial nerves II through XII are intact.  Muscle strength 5/5 in all extremities. Sensation intact. SKIN: No obvious rash, lesion, or ulcer.   Assessment & Plan:  Acute on chronic hypoxic/hypercapnic Respiratory Failure possibly due to mucous plug s/p trach No evidence of Volume overload. PMHx: Severe COPD and Sarcoidosis, OSA, former smoker Patient is back to baseline He tolerated pressure  support trial Currently on trach collar He had mucous plugging this morning, had aggressive suctioning Considering history of  obstructive sleep apnea, I think patient will require nocturnal vent Acute COPD exacerbation was ruled out Continue chest PT Continue home meds for COPD  Chronic systolic congestive Heart Failure NYHA class II (Last known EF 45-50%) Patient looks euvolemic Hypertension CAD s/p cardiac cath, HLD  Monitor intake and output Continue Lasix 40 mg IV At home he takes 80 mg p.o. Holding lisinopril considering AKI Continue metoprolol, aspirin and atorvastatin  Paroxysmal Atrial Fibrillation Currently in sinus rhythm CHA2DS2-VASc score: 3 Continue Eliquis for stroke prophylaxis Continue Metoprolol for rate control once able to take po  Acute on CKD Stage III Hypophosphatemia Monitor I&O's / urinary output Closely monitor serum creatinine and electrolytes Avoid nephrotoxic agents as able Replace electrolytes as indicated   Diabetes mellitus Type ll -CBGs -Sliding scale insulin -Follow ICU hyper/hypoglycemia protocol  Best practice:  Diet:  NPO, speech and swallow evaluation Pain/Anxiety/Delirium protocol (if indicated): N/A VAP protocol (if indicated): Yes DVT prophylaxis: Systemic AC GI prophylaxis: H2B Glucose control:  SSI Yes Central venous access:  N/A Arterial line:  N/A Foley:  N/A Mobility: As tolerated PT consulted: Yes Last date of multidisciplinary goals of care discussion [7/28] Code Status:  full code Disposition: ICU   = Goals of Care = Code Status Order: FULL  Primary Emergency Contact: [place],[name] Wishes to pursue full aggressive treatment and intervention options, including CPR and intubation, goals of care will be addressed on going with family if that should become necessary.   Total critical care time: 40 minutes  Performed by: [place] care time was exclusive of separately billable procedures and treating other patients.   Critical care was necessary to treat or prevent imminent or life-threatening deterioration.   Critical  care was time spent personally by me on the following activities: development of treatment plan with patient and/or surrogate as well as nursing, discussions with consultants, evaluation of patient's response to treatment, examination of patient, obtaining history from patient or surrogate, ordering and performing treatments and interventions, ordering and review of laboratory studies, ordering and review of radiographic studies, pulse oximetry and re-evaluation of patient's condition.   Jacky Kindle MD [facility] See Amion for pager If no response to pager, please call [phone] until 7pm After 7pm, Please call E-link [phone]   .

## 2020-09-02 NOTE — [place] Notes (Signed)
This AM, pt transitioned from vent to trach collar. Passy muir applied. Pt started on carb mod diet. Pt up to chair.

## 2020-09-02 NOTE — Consult Note (Signed)
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 Pharmacy consulted to assist with electrolyte monitoring and replacement as indicated.  Diuretics: IV Lasix 40 mg daily  Goal of Therapy:  Electrolytes within normal limits  Plan:  --K 5, patient started on IV Lasix today --Phos 2.1, provider ordered IV sodium phosphate 30 mmol x 1 --Follow-up electrolytes with AM labs tomorrow  [name] 09/02/2020 1:23 PM

## 2020-09-02 NOTE — Evaluation (Signed)
Occupational Therapy Evaluation Patient Details Name: [name] MRN: [id number] DOB: 1970-11-22 Today's Date: 09/02/2020    History of Present Illness [name] is a [name] comes to [facility] on 08/31/20 c SOB. Pt admitted with hypercapnic resp failuer 2/2 COPD exacerbation. PMH: CHF (combined), COPD, CAD, MI, DM2, HLD, HTN, sarcoidosis, OSA, CVA, CRF  c tracheostomy on chronic 2L at home, current tobacco and polysubstance abuse.   Clinical Impression   Pt seen for OT evaluation this date in setting of acute hospitalization d/t SOB and acute RF. Pt on 9L via trach collar at time of OT evaluation. Pt able to come to EOB sitting with MOD I with HOB elevated. Pt able to come to standing with SUPV with no AD and demos G static standing balance. Pt requires CGA to SUPV for ~7-8 steps from bed to chair. Pt able to again come to standing to complete LB bathing tasks with CGA for balance d/t more dynamic nature of task. Pt requires SETUP for seated UB bathing while seated in chair. Pt left seated in recliner at end of session with all needs met and in reach. Dis de-sat during 2 episodes of coughing  to ~80-82%, but was able to successfully clear secretions and appropriately use yaunker to remove from end of his trach. Recovered to >90% within ~20-30 seconds. RN aware. Pt with very mild deficits in strength and tolerance versus his functional baseline. Anticipate he will be able to return to home environment after hospital stay, but will require f/u HHOT to prevent falls, reduce caregiver burden, and improve activity tolerance.    Follow Up Recommendations  Home health OT    Equipment Recommendations  None recommended by OT (has all necessary equipment)    Recommendations for Other Services       Precautions / Restrictions Precautions Precautions: None Restrictions Weight Bearing Restrictions: No      Mobility Bed Mobility Overal bed mobility: Independent                   Transfers Overall transfer level: Needs assistance Equipment used: None Transfers: Sit to/from Stand Sit to Stand: Min guard         General transfer comment: increased time, cues, some unsteadiness, but no LOB.    Balance Overall balance assessment: No apparent balance deficits (not formally assessed) Sitting-balance support: Feet supported Sitting balance-Leahy Scale: Good     Standing balance support: No upper extremity supported Standing balance-Leahy Scale: Fair                             ADL either performed or assessed with clinical judgement   ADL Overall ADL's : Needs assistance/impaired                                       General ADL Comments: SETUP for seated UB ADLs, CGA for STS with no AD for LB ADLs.     Vision Patient Visual Report: No change from baseline       Perception     Praxis      Pertinent Vitals/Pain Pain Assessment: No/denies pain     Hand Dominance Right   Extremity/Trunk Assessment Upper Extremity Assessment Upper Extremity Assessment: Overall WFL for tasks assessed   Lower Extremity Assessment Lower Extremity Assessment: Overall WFL for tasks assessed   Cervical / Trunk Assessment  Cervical / Trunk Assessment: Normal   Communication Communication Communication: Passy-Muir valve   Cognition Arousal/Alertness: Awake/alert Behavior During Therapy: WFL for tasks assessed/performed Overall Cognitive Status: Within Functional Limits for tasks assessed                                 General Comments: PMV not available for session, but pt answers questions using pen and paper or mouthing appropriately.   General Comments       Exercises Other Exercises Other Exercises: OT engages pt in STS and ~6-7 steps from bed to chair. STanding LB dressing with CGA. No AD. Pt tolerates well. Re-ed re: role of OT in acute setting, pt with good understanding.   Shoulder Instructions       Home Living Family/patient expects to be discharged to:: Private residence Living Arrangements: Spouse/significant other Available Help at Discharge: Family Type of Home: House Home Access: Stairs to enter Technical brewer of Steps: 3 Entrance Stairs-Rails: None Home Layout: One level     Bathroom Shower/Tub: Tub/shower unit         Home Equipment: [place] - single point   Additional Comments: vent support machine at night for OSA, 2L O2 at baseline; floor concentrator at home, portable concentrator, PMV use at day.      Prior Functioning/Environment Level of Independence: Independent        Comments: no device at baseline, 2L with PMV, community ambulator        OT Problem List: Decreased strength;Decreased activity tolerance;Cardiopulmonary status limiting activity      OT Treatment/Interventions: Self-care/ADL training;Therapeutic activities;Therapeutic exercise;Energy conservation;Patient/family education    OT Goals(Current goals can be found in the care plan section) Acute Rehab OT Goals Patient Stated Goal: get stronger and able to stay off vent during day OT Goal Formulation: With patient Time For Goal Achievement: 09/16/20 Potential to Achieve Goals: Good ADL Goals Pt Will Perform Lower Body Bathing: with modified independence;sit to/from stand Pt Will Perform Lower Body Dressing: with modified independence;sit to/from stand Pt Will Transfer to Toilet: with modified independence;ambulating (with LRAD PRN to [facility] ~15' away ot improve tolerance for fxl HH distances) Pt/caregiver will Perform Home Exercise Program: Increased strength;Both right and left upper extremity;With Supervision  OT Frequency: Min 2X/week   Barriers to D/C:            Co-evaluation              AM-PAC OT "6 Clicks" Daily Activity     Outcome Measure Help from another person eating meals?: None Help from another person taking care of personal grooming?: A Little Help  from another person toileting, which includes using toliet, bedpan, or urinal?: A Little Help from another person bathing (including washing, rinsing, drying)?: A Little Help from another person to put on and taking off regular upper body clothing?: None Help from another person to put on and taking off regular lower body clothing?: A Little 6 Click Score: 20   End of Session Nurse Communication: Mobility status  Activity Tolerance: Patient tolerated treatment well Patient left: in chair;with call bell/phone within reach  OT Visit Diagnosis: Unsteadiness on feet (R26.81);Muscle weakness (generalized) (M62.81)                Time: 1700-1749 OT Time Calculation (min): 37 min Charges:  OT General Charges $OT Visit: 1 Visit OT Evaluation $OT Eval Moderate Complexity: 1 Mod OT Treatments $Self Care/Home Management :  8-22 mins $Therapeutic Activity: 8-22 mins  Gerrianne Scale, [place], OTR/L ascom [phone] 09/02/20, 3:37 PM

## 2020-09-02 NOTE — Evaluation (Signed)
Physical Therapy Evaluation Patient Details Name: [name] MRN: [id number] DOB: 1970/12/04 Today's Date: 09/02/2020   History of Present Illness  [name] is a [name] comes to [facility] on 08/31/20 c SOB. Pt admitted with hypercapnic resp failuer 2/2 COPD exacerbation. PMH: CHF (combined), COPD, CAD, MI, DM2, HLD, HTN, sarcoidosis, OSA, CVA, CRF  c tracheostomy on chronic 2L at home, current tobacco and polysubstance abuse.  Clinical Impression  Pt admitted with above diagnosis. Pt currently with functional limitations due to the deficits listed below (see "PT Problem List"). Upon entry, pt in chair, awake and agreeable to participate. Strength, balance, and basic mobility all at baseline. Pt still requiring 10L O2 for standing activity. Pt on PMV/trach collar throughout session. No services or DME recommended for home at this time. Pt will benefit from skilled PT intervention to increase independence and safety with basic mobility in preparation for discharge to the venue listed below.       Follow Up Recommendations No PT follow up    Equipment Recommendations  None recommended by PT    Recommendations for Other Services       Precautions / Restrictions Precautions Precautions: None Restrictions Weight Bearing Restrictions: No      Mobility  Bed Mobility Overal bed mobility: Independent                  Transfers Overall transfer level: Independent Equipment used: None                Ambulation/Gait Ambulation/Gait assistance: Modified independent (Device/Increase time);Supervision Gait Distance (Feet): 20 Feet Assistive device: None       General Gait Details: lines/leads limit ICU AMB, PMV still attached to wall O2/corrugated blue tube. HR consistnetly in 110s,, sats 89% on 10L or better.  Stairs            Wheelchair Mobility    Modified Rankin (Stroke Patients Only)       Balance Overall balance assessment: Independent;No  apparent balance deficits (not formally assessed)                                           Pertinent Vitals/Pain Pain Assessment: No/denies pain    Home Living Family/patient expects to be discharged to:: Private residence Living Arrangements: Spouse/significant other Available Help at Discharge: Family Type of Home: House Home Access: Stairs to enter Entrance Stairs-Rails: None Entrance Stairs-Number of Steps: 3 Home Layout: One level Home Equipment: Cane - single point Additional Comments: vent support machine at night for OSA, 2L O2 at baseline; floor concentrator at home, portable concentrator, PMV use at day.    Prior Function Level of Independence: Independent         Comments: no device at baseline, 2L with PMV, community ambulator     Hand Dominance   Dominant Hand: Right    Extremity/Trunk Assessment   Upper Extremity Assessment Upper Extremity Assessment: Overall WFL for tasks assessed    Lower Extremity Assessment Lower Extremity Assessment: Overall WFL for tasks assessed    Cervical / Trunk Assessment Cervical / Trunk Assessment: Normal  Communication   Communication: Passy-Muir valve  Cognition Arousal/Alertness: Awake/alert Behavior During Therapy: WFL for tasks assessed/performed Overall Cognitive Status: Within Functional Limits for tasks assessed  General Comments      Exercises     Assessment/Plan    PT Assessment Patient needs continued PT services  PT Problem List Cardiopulmonary status limiting activity       PT Treatment Interventions Therapeutic activities;Therapeutic exercise;Patient/family education    PT Goals (Current goals can be found in the Care Plan section)  Acute Rehab PT Goals Patient Stated Goal: return to baseline flowrate PT Goal Formulation: With patient Time For Goal Achievement: 09/16/20 Potential to Achieve Goals: Good     Frequency Min 2X/week   Barriers to discharge        Co-evaluation               AM-PAC PT "6 Clicks" Mobility  Outcome Measure Help needed turning from your back to your side while in a flat bed without using bedrails?: None Help needed moving from lying on your back to sitting on the side of a flat bed without using bedrails?: None Help needed moving to and from a bed to a chair (including a wheelchair)?: None Help needed standing up from a chair using your arms (e.g., wheelchair or bedside chair)?: None Help needed to walk in hospital room?: None Help needed climbing 3-5 steps with a railing? : None 6 Click Score: 24    End of Session Equipment Utilized During Treatment: Oxygen Activity Tolerance: Patient tolerated treatment well;No increased pain Patient left: in bed;with call bell/phone within reach Nurse Communication: Mobility status PT Visit Diagnosis: Other abnormalities of gait and mobility (R26.89);Difficulty in walking, not elsewhere classified (R26.2)    Time: [id number] PT Time Calculation (min) (ACUTE ONLY): 40 min   Charges:   PT Evaluation $PT Eval Moderate Complexity: 1 Mod PT Treatments $Therapeutic Activity: 8-22 mins   3:07 PM, 09/02/20 Etta Grandchild, PT, DPT Physical Therapist - [facility]  [phone] ([place])   [place] C 09/02/2020, 3:05 PM

## 2020-09-03 DIAGNOSIS — E662 Morbid (severe) obesity with alveolar hypoventilation: Secondary | ICD-10-CM | POA: Diagnosis not present

## 2020-09-03 DIAGNOSIS — I5022 Chronic systolic (congestive) heart failure: Secondary | ICD-10-CM

## 2020-09-03 DIAGNOSIS — J9621 Acute and chronic respiratory failure with hypoxia: Secondary | ICD-10-CM | POA: Diagnosis not present

## 2020-09-03 LAB — BASIC METABOLIC PANEL
Anion gap: 7 (ref 5–15)
BUN: 29 mg/dL — ABNORMAL HIGH (ref 6–20)
CO2: 36 mmol/L — ABNORMAL HIGH (ref 22–32)
Calcium: 8.8 mg/dL — ABNORMAL LOW (ref 8.9–10.3)
Chloride: 97 mmol/L — ABNORMAL LOW (ref 98–111)
Creatinine, Ser: 1.54 mg/dL — ABNORMAL HIGH (ref 0.61–1.24)
GFR, Estimated: 55 mL/min — ABNORMAL LOW (ref >60)
Glucose, Bld: 147 mg/dL — ABNORMAL HIGH (ref 70–99)
Potassium: 4.4 mmol/L (ref 3.5–5.1)
Sodium: 140 mmol/L (ref 135–145)

## 2020-09-03 LAB — CBC WITH DIFFERENTIAL/PLATELET
Abs Immature Granulocytes: 0.02 10*3/uL (ref 0.00–0.07)
Basophils Absolute: 0 10*3/uL (ref 0.0–0.1)
Basophils Relative: 1 %
Eosinophils Absolute: 0 10*3/uL (ref 0.0–0.5)
Eosinophils Relative: 1 %
HCT: 43.3 % (ref 39.0–52.0)
Hemoglobin: 13.6 g/dL (ref 13.0–17.0)
Immature Granulocytes: 0 %
Lymphocytes Relative: 20 %
Lymphs Abs: 1.8 10*3/uL (ref 0.7–4.0)
MCH: 28.2 pg (ref 26.0–34.0)
MCHC: 31.4 g/dL (ref 30.0–36.0)
MCV: 89.6 fL (ref 80.0–100.0)
Monocytes Absolute: 1.2 10*3/uL — ABNORMAL HIGH (ref 0.1–1.0)
Monocytes Relative: 13 %
Neutro Abs: 5.7 10*3/uL (ref 1.7–7.7)
Neutrophils Relative %: 65 %
Platelets: 204 10*3/uL (ref 150–400)
RBC: 4.83 MIL/uL (ref 4.22–5.81)
RDW: 13.7 % (ref 11.5–15.5)
WBC: 8.7 10*3/uL (ref 4.0–10.5)
nRBC: 0 % (ref 0.0–0.2)

## 2020-09-03 LAB — MAGNESIUM: Magnesium: 2.2 mg/dL (ref 1.7–2.4)

## 2020-09-03 LAB — GLUCOSE, CAPILLARY
Glucose-Capillary: 121 mg/dL — ABNORMAL HIGH (ref 70–99)
Glucose-Capillary: 129 mg/dL — ABNORMAL HIGH (ref 70–99)

## 2020-09-03 LAB — THEOPHYLLINE LEVEL: Theophylline Lvl: <0.8 ug/mL — ABNORMAL LOW (ref 10.0–20.0)

## 2020-09-03 LAB — PHOSPHORUS: Phosphorus: 3.3 mg/dL (ref 2.5–4.6)

## 2020-09-03 LAB — PROCALCITONIN: Procalcitonin: <0.1 ng/mL

## 2020-09-03 MED ORDER — INSULIN ASPART 100 UNIT/ML IJ SOLN
0.0000 [IU] | Freq: Three times a day (TID) | INTRAMUSCULAR | Status: DC
  Administered 2020-09-04: 1 [IU] via SUBCUTANEOUS
  Administered 2020-09-04: 2 [IU] via SUBCUTANEOUS
  Administered 2020-09-04 – 2020-09-05 (×3): 1 [IU] via SUBCUTANEOUS
  Administered 2020-09-06: 3 [IU] via SUBCUTANEOUS
  Administered 2020-09-06 (×2): 1 [IU] via SUBCUTANEOUS
  Administered 2020-09-07: 5 [IU] via SUBCUTANEOUS
  Administered 2020-09-08 (×2): 1 [IU] via SUBCUTANEOUS
  Administered 2020-09-09 (×2): 2 [IU] via SUBCUTANEOUS
  Administered 2020-09-09: 1 [IU] via SUBCUTANEOUS
  Administered 2020-09-10: 2 [IU] via SUBCUTANEOUS
  Administered 2020-09-11: 1 [IU] via SUBCUTANEOUS
  Administered 2020-09-11: 2 [IU] via SUBCUTANEOUS
  Administered 2020-09-11: 1 [IU] via SUBCUTANEOUS
  Administered 2020-09-12 (×2): 2 [IU] via SUBCUTANEOUS
  Administered 2020-09-13: 1 [IU] via SUBCUTANEOUS
  Administered 2020-09-13: 2 [IU] via SUBCUTANEOUS
  Administered 2020-09-14: 1 [IU] via SUBCUTANEOUS
  Filled 2020-09-03 (×22): qty 1

## 2020-09-03 MED ORDER — BUDESONIDE 0.5 MG/2ML IN SUSP
0.5000 mg | Freq: Two times a day (BID) | RESPIRATORY_TRACT | Status: DC
  Administered 2020-09-03 – 2020-09-14 (×23): 0.5 mg via RESPIRATORY_TRACT
  Filled 2020-09-03 (×23): qty 2

## 2020-09-03 NOTE — Consult Note (Signed)
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 Pharmacy consulted to assist with electrolyte monitoring and replacement as indicated.  Diuretics: IV Lasix 40 mg daily -K 5>4.4 -Phos 2.1>3.3  Goal of Therapy:  Electrolytes within normal limits  Plan:  Pt continues on IV lasix '40mg'$  QD, K+ dec'd but WNL, phos WNL today; no further repletion. --Follow-up electrolytes with AM labs tomorrow  Lorna Dibble 09/03/2020 7:41 AM

## 2020-09-03 NOTE — Progress Notes (Signed)
PROGRESS NOTE    [name]  [id number] DOB: [birth year] DOA: 09/01/2020 PCP: [place]   Assessment & Plan:   Active Problems:   Acute on chronic respiratory failure with hypoxemia (HCC)   Acute on chronic hypoxic & hypercapnic respiratory failure: possibly due to mucous plug as per ICU. S/p trach which was placed in Nov 2021. Continue on trach collar & will likely require vent qhs. Continue on bronchodilators  Chronic systolic CHF: appears euvolemic. Continue on IV lasix. Monitor I/Os. Continue on metoprolol, aspirin, & statin. Holding lisinopril secondary to AKI  PAF: continue on metoprolol, eliquis.  AKI on CKDIIIa: Cr is trending up from day prior. Avoid nephrotoxic meds  Hypophosphatemia: WNL today   DM2: likely poorly controlled. Continue on SSI w/ accuchecks  Morbid obesity: BMI 35.4. Complicates overall care & prognosis     DVT prophylaxis: eliquis  Code Status: full  Family Communication:  Disposition Plan: likely d/c back home   Level of care: ICU  Status is: Inpatient  Remains inpatient appropriate because:IV treatments appropriate due to intensity of illness or inability to take PO and Inpatient level of care appropriate due to severity of illness  Dispo: The patient is from: Home              Anticipated d/c is to: Home              Patient currently is not medically stable to d/c.   Difficult to place patient Yes    Consultants:  ICU   Procedures:   Antimicrobials:   Subjective: Pt c/o fatigue  Objective: Vitals:   09/03/20 0500 09/03/20 0600 09/03/20 0700 09/03/20 0748  BP:      Pulse: 86 82 86   Resp: (!) 21 (!) 25 16   Temp:      TempSrc:      SpO2: 96% 97% 97% 94%  Weight:      Height:        Intake/Output Summary (Last 24 hours) at 09/03/2020 0828 Last data filed at 09/03/2020 0730 Gross per 24 hour  Intake --  Output 2550 ml  Net -2550 ml   Filed Weights   09/01/20 2125 09/02/20 0221   Weight: 121.9 kg 121.9 kg    Examination:  General exam: Appears calm and comfortable  Respiratory system: diminished breath sounds b/l. Cardiovascular system: S1 & S2 +. No  rubs, gallops or clicks.  Gastrointestinal system: Abdomen is obese, soft and nontender. Normal bowel sounds heard. Central nervous system: Alert and oriented. Moves all extremities  Psychiatry: Judgement and insight appear normal. Mood & affect appropriate.     Data Reviewed: I have personally reviewed following labs and imaging studies  CBC: Recent Labs  Lab 09/01/20 1531 09/02/20 0015 09/03/20 0520  WBC 7.1 7.0 8.7  NEUTROABS  --   --  5.7  HGB 15.2 14.6 13.6  HCT 48.0 46.5 43.3  MCV 91.3 90.8 89.6  PLT 197 187 0000000   Basic Metabolic Panel: Recent Labs  Lab 09/01/20 1531 09/02/20 0015 09/03/20 0520  NA 142 141 140  K 4.4 5.0 4.4  CL 102 100 97*  CO2 33* 36* 36*  GLUCOSE 129* 140* 147*  BUN 17 18 29*  CREATININE 1.26* 1.51* 1.54*  CALCIUM 8.6* 8.6* 8.8*  MG  --  2.0 2.2  PHOS  --  2.1* 3.3   GFR: Estimated Creatinine Clearance: 78.5 mL/min (A) (by C-G formula based on SCr of 1.54 mg/dL (H)). Liver  Function Tests: No results for input(s): AST, ALT, ALKPHOS, BILITOT, PROT, ALBUMIN in the last 168 hours. No results for input(s): LIPASE, AMYLASE in the last 168 hours. No results for input(s): AMMONIA in the last 168 hours. Coagulation Profile: No results for input(s): INR, PROTIME in the last 168 hours. Cardiac Enzymes: No results for input(s): CKTOTAL, CKMB, CKMBINDEX, TROPONINI in the last 168 hours. BNP (last 3 results) No results for input(s): PROBNP in the last 8760 hours. HbA1C: No results for input(s): HGBA1C in the last 72 hours. CBG: Recent Labs  Lab 09/01/20 2125  GLUCAP 113*   Lipid Profile: No results for input(s): CHOL, HDL, LDLCALC, TRIG, CHOLHDL, LDLDIRECT in the last 72 hours. Thyroid Function Tests: No results for input(s): TSH, T4TOTAL, FREET4, T3FREE,  THYROIDAB in the last 72 hours. Anemia Panel: No results for input(s): VITAMINB12, FOLATE, FERRITIN, TIBC, IRON, RETICCTPCT in the last 72 hours. Sepsis Labs: Recent Labs  Lab 09/01/20 2135 09/02/20 0015  PROCALCITON <0.10 <0.10  LATICACIDVEN 1.5 1.9    Recent Results (from the past 240 hour(s))  Resp Panel by RT-PCR (Flu A&B, Covid) Nasopharyngeal Swab     Status: None   Collection Time: 09/01/20  7:14 PM   Specimen: Nasopharyngeal Swab; Nasopharyngeal(NP) swabs in vial transport medium  Result Value Ref Range Status   SARS Coronavirus 2 by RT PCR NEGATIVE NEGATIVE Final    Comment: (NOTE) SARS-CoV-2 target nucleic acids are NOT DETECTED.  The SARS-CoV-2 RNA is generally detectable in upper respiratory specimens during the acute phase of infection. The lowest concentration of SARS-CoV-2 viral copies this assay can detect is 138 copies/mL. A negative result does not preclude SARS-Cov-2 infection and should not be used as the sole basis for treatment or other patient management decisions. A negative result may occur with  improper specimen collection/handling, submission of specimen other than nasopharyngeal swab, presence of viral mutation(s) within the areas targeted by this assay, and inadequate number of viral copies(<138 copies/mL). A negative result must be combined with clinical observations, patient history, and epidemiological information. The expected result is Negative.  Fact Sheet for Patients:  EntrepreneurPulse.com.au  Fact Sheet for Healthcare Providers:  IncredibleEmployment.be  This test is no t yet approved or cleared by the [place] FDA and  has been authorized for detection and/or diagnosis of SARS-CoV-2 by FDA under an Emergency Use Authorization (EUA). This EUA will remain  in effect (meaning this test can be used) for the duration of the COVID-19 declaration under Section 564(b)(1) of the Act, 21 U.S.C.section  360bbb-3(b)(1), unless the authorization is terminated  or revoked sooner.       Influenza A by PCR NEGATIVE NEGATIVE Final   Influenza B by PCR NEGATIVE NEGATIVE Final    Comment: (NOTE) The Xpert Xpress SARS-CoV-2/FLU/RSV plus assay is intended as an aid in the diagnosis of influenza from Nasopharyngeal swab specimens and should not be used as a sole basis for treatment. Nasal washings and aspirates are unacceptable for Xpert Xpress SARS-CoV-2/FLU/RSV testing.  Fact Sheet for Patients: EntrepreneurPulse.com.au  Fact Sheet for Healthcare Providers: IncredibleEmployment.be  This test is not yet approved or cleared by the [place] FDA and has been authorized for detection and/or diagnosis of SARS-CoV-2 by FDA under an Emergency Use Authorization (EUA). This EUA will remain in effect (meaning this test can be used) for the duration of the COVID-19 declaration under Section 564(b)(1) of the Act, 21 U.S.C. section 360bbb-3(b)(1), unless the authorization is terminated or revoked.  Performed at [facility],  [place], [place] [address]   MRSA Next Gen by PCR, Nasal     Status: None   Collection Time: 09/01/20  9:23 PM   Specimen: Nasal Mucosa; Nasal Swab  Result Value Ref Range Status   MRSA by PCR Next Gen NOT DETECTED NOT DETECTED Final    Comment: (NOTE) The GeneXpert MRSA Assay (FDA approved for NASAL specimens only), is one component of a comprehensive MRSA colonization surveillance program. It is not intended to diagnose MRSA infection nor to guide or monitor treatment for MRSA infections. Test performance is not FDA approved in patients less than 107 years old. Performed at [facility], [address] [place], [place] [address]   CULTURE, BLOOD (ROUTINE X 2) w Reflex to ID Panel     Status: None (Preliminary result)   Collection Time: 09/01/20  9:43 PM   Specimen: BLOOD  Result Value Ref  Range Status   Specimen Description BLOOD RIGHT ASSIST CONTROL  Final   Special Requests   Final    BOTTLES DRAWN AEROBIC AND ANAEROBIC Blood Culture adequate volume   Culture   Final    NO GROWTH < 24 HOURS Performed at [facility], [address] [place], [place] [address]    Report Status PENDING  Incomplete  CULTURE, BLOOD (ROUTINE X 2) w Reflex to ID Panel     Status: None (Preliminary result)   Collection Time: 09/01/20  9:50 PM   Specimen: BLOOD  Result Value Ref Range Status   Specimen Description BLOOD RIGHT HAND  Final   Special Requests   Final    BOTTLES DRAWN AEROBIC ONLY Blood Culture results may not be optimal due to an inadequate volume of blood received in culture bottles   Culture   Final    NO GROWTH < 24 HOURS Performed at [facility], [address], [place], [place] [address]    Report Status PENDING  Incomplete         Radiology Studies: DG Chest 2 View  Result Date: 09/01/2020 CLINICAL DATA:  dyspnea EXAM: CHEST - 2 VIEW COMPARISON:  July 23, 2020 FINDINGS: The cardiomediastinal silhouette is unchanged in contour with irregular RIGHT inferior hilar contours.Unchanged irregular opacity along the RIGHT superior mediastinal border. Tracheostomy tube. No pleural effusion. No pneumothorax. Increased hazy opacities at the LEFT lung base. Visualized abdomen is unremarkable. IMPRESSION: Increased hazy opacities of the LEFT lung base. Differential considerations include infection, aspiration or atelectasis. Electronically Signed   By: Valentino Saxon MD   On: 09/01/2020 16:18   DG Chest Portable 1 View  Result Date: 09/01/2020 CLINICAL DATA:  Dyspnea EXAM: PORTABLE CHEST 1 VIEW COMPARISON:  Chest radiograph from earlier today. FINDINGS: Tracheostomy tube tip overlies the tracheal air column at the thoracic inlet. Stable cardiomediastinal silhouette with normal heart size. No pneumothorax. Mild blunting of the bilateral costophrenic angles,  unchanged. No overt pulmonary edema. Improved aeration at the left lung base. Residual mild left lung base hazy opacity. Stable right lung base linear scarring. IMPRESSION: 1. Improved aeration at the left lung base. Residual mild hazy left lung base opacity, favor scarring or atelectasis. 2. Stable mild blunting of the bilateral costophrenic angles, either small pleural effusions or pleuroparenchymal scarring. Electronically Signed   By: Ilona Sorrel M.D.   On: 09/01/2020 19:16        Scheduled Meds:  apixaban  5 mg Oral BID   arformoterol  15 mcg Nebulization BID   aspirin EC  81  mg Oral Daily   atorvastatin  40 mg Oral QHS   budesonide (PULMICORT) nebulizer solution  0.5 mg Nebulization BID   Chlorhexidine Gluconate Cloth  6 each Topical Q0600   furosemide  40 mg Intravenous Daily   ipratropium-albuterol  3 mL Nebulization Q6H   melatonin  5 mg Oral QHS   metoprolol tartrate  100 mg Oral Daily   theophylline  300 mg Oral Q12H   Continuous Infusions:   LOS: 2 days    Time spent: 35 mins    Wyvonnia Dusky, MD [facility] Pager [phone]  If 7PM-7AM, please contact night-coverage 09/03/2020, 8:28 AM

## 2020-09-03 NOTE — Progress Notes (Signed)
Pt. Presented in ER from home with cuffless trach,lethargic and desaturations to the mid 80's.Trach changed to 6.0 shiley flexible cuffed and placed on ventilator without incident.

## 2020-09-03 NOTE — Progress Notes (Signed)
Pt on trach collar during day. Passy muir ON. Pt eating and out of bed. Pt made ACHS this evening (BG 121).

## 2020-09-03 NOTE — Progress Notes (Signed)
Patient had questions about process of ventilation at night when discharged; mask or via trach. I verified last documentation noted in epic from providers as the agreed method would most likely be vent settings via trach. Explained arrangements would have be made with home health company to accommodate settings/equipment and education. Explained since this is weekend, I would think all appropriate arrangements would take place on the next business day via physicians, social workers, and DME provider.  I explained we would proceed placing him on vent via trach as no indication has been made to change this process.

## 2020-09-04 DIAGNOSIS — E662 Morbid (severe) obesity with alveolar hypoventilation: Secondary | ICD-10-CM | POA: Diagnosis not present

## 2020-09-04 DIAGNOSIS — I5022 Chronic systolic (congestive) heart failure: Secondary | ICD-10-CM | POA: Diagnosis not present

## 2020-09-04 DIAGNOSIS — J9621 Acute and chronic respiratory failure with hypoxia: Secondary | ICD-10-CM | POA: Diagnosis not present

## 2020-09-04 LAB — GLUCOSE, CAPILLARY
Glucose-Capillary: 139 mg/dL — ABNORMAL HIGH (ref 70–99)
Glucose-Capillary: 174 mg/dL — ABNORMAL HIGH (ref 70–99)
Glucose-Capillary: 150 mg/dL — ABNORMAL HIGH (ref 70–99)
Glucose-Capillary: 133 mg/dL — ABNORMAL HIGH (ref 70–99)

## 2020-09-04 LAB — PHOSPHORUS: Phosphorus: 3.1 mg/dL (ref 2.5–4.6)

## 2020-09-04 LAB — CBC
HCT: 42.9 % (ref 39.0–52.0)
Hemoglobin: 13.6 g/dL (ref 13.0–17.0)
MCH: 28.7 pg (ref 26.0–34.0)
MCHC: 31.7 g/dL (ref 30.0–36.0)
MCV: 90.5 fL (ref 80.0–100.0)
Platelets: 187 10*3/uL (ref 150–400)
RBC: 4.74 MIL/uL (ref 4.22–5.81)
RDW: 13.5 % (ref 11.5–15.5)
WBC: 6 10*3/uL (ref 4.0–10.5)
nRBC: 0 % (ref 0.0–0.2)

## 2020-09-04 LAB — BASIC METABOLIC PANEL
Anion gap: 4 — ABNORMAL LOW (ref 5–15)
BUN: 26 mg/dL — ABNORMAL HIGH (ref 6–20)
CO2: 37 mmol/L — ABNORMAL HIGH (ref 22–32)
Calcium: 8.5 mg/dL — ABNORMAL LOW (ref 8.9–10.3)
Chloride: 101 mmol/L (ref 98–111)
Creatinine, Ser: 1.5 mg/dL — ABNORMAL HIGH (ref 0.61–1.24)
GFR, Estimated: 56 mL/min — ABNORMAL LOW (ref >60)
Glucose, Bld: 169 mg/dL — ABNORMAL HIGH (ref 70–99)
Potassium: 4.4 mmol/L (ref 3.5–5.1)
Sodium: 142 mmol/L (ref 135–145)

## 2020-09-04 LAB — MAGNESIUM: Magnesium: 2.2 mg/dL (ref 1.7–2.4)

## 2020-09-04 MED ORDER — CHLORHEXIDINE GLUCONATE CLOTH 2 % EX PADS
6.0000 | MEDICATED_PAD | Freq: Every day | CUTANEOUS | Status: DC
  Administered 2020-09-04 – 2020-09-14 (×10): 6 via TOPICAL

## 2020-09-04 NOTE — Consult Note (Signed)
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 Pharmacy consulted to assist with electrolyte monitoring and replacement as indicated.  Diuretics: IV Lasix 40 mg daily -K 4.4>4.4 -Phos 3.3>3.1  Goal of Therapy:  Electrolytes within normal limits  Plan:  Pt continues on IV lasix '40mg'$  QD, K+ stable/WNL, phos WNL today; no further repletion. --Follow-up electrolytes with AM labs tomorrow  Lorna Dibble 09/04/2020 8:43 AM

## 2020-09-04 NOTE — Progress Notes (Signed)
PROGRESS NOTE    DAMERON HUGHBANKS  [id number] DOB: Feb 07, 1970 DOA: 09/01/2020 PCP: [place]   Assessment & Plan:   Active Problems:   Acute on chronic respiratory failure with hypoxemia (HCC)   Obesity hypoventilation syndrome (HCC)   Acute on chronic hypoxic & hypercapnic respiratory failure: possibly due to mucous plug as per ICU. S/p trach which was placed in Nov 2021. Continue on trach collar & will likely require vent qhs. Will have pulmon see pt tomorrow in regards to using trilogy at home as pt is using vent qhs. Continue on bronchodilators  Chronic systolic CHF: appears euvolemic. Continue on IV lasix, metoprolol, aspirin & statin.  Continue to hold home dose of lisinopril. Monitor I/Os  PAF: continue on eliquis, BB   AKI on CKDIIIa: Cr is trending down slightly from day prior.   Hypophosphatemia: WNL today   DM2: likely poorly controlled. Continue on SSI w/ accuchecks  Morbid obesity: BMI 35.4. Complicates overall care & prognosis      DVT prophylaxis: eliquis  Code Status: full  Family Communication:  Disposition Plan: likely d/c back home   Level of care: Progressive Cardiac  Status is: Inpatient  Remains inpatient appropriate because:IV treatments appropriate due to intensity of illness or inability to take PO and Inpatient level of care appropriate due to severity of illness  Dispo: The patient is from: Home              Anticipated d/c is to: Home              Patient currently is not medically stable to d/c.   Difficult to place patient Yes    Consultants:  ICU   Procedures:   Antimicrobials:   Subjective: Pt c/o fatigue  Objective: Vitals:   09/04/20 0100 09/04/20 0200 09/04/20 0209 09/04/20 0706  BP:      Pulse:  76    Resp: 20 20    Temp:      TempSrc:      SpO2:  100% 100% 94%  Weight:      Height:        Intake/Output Summary (Last 24 hours) at 09/04/2020 0800 Last data filed at 09/04/2020 0200 Gross  per 24 hour  Intake --  Output 2000 ml  Net -2000 ml   Filed Weights   09/01/20 2125 09/02/20 0221  Weight: 121.9 kg 121.9 kg    Examination:  General exam: Appears comfortable   Respiratory system: decreased breath sounds b/l  Cardiovascular system: S1 & S2+. No rubs or clicks   Gastrointestinal system: Abd is soft, NT, obese & hypoactive bowel sounds  Central nervous system: Alert and oriented. Moves all extremities Psychiatry: Judgement and insight appear normal. Appropriate mood and affect    Data Reviewed: I have personally reviewed following labs and imaging studies  CBC: Recent Labs  Lab 09/01/20 1531 09/02/20 0015 09/03/20 0520 09/04/20 0506  WBC 7.1 7.0 8.7 6.0  NEUTROABS  --   --  5.7  --   HGB 15.2 14.6 13.6 13.6  HCT 48.0 46.5 43.3 42.9  MCV 91.3 90.8 89.6 90.5  PLT 197 187 204 [id number]   Basic Metabolic Panel: Recent Labs  Lab 09/01/20 1531 09/02/20 0015 09/03/20 0520 09/04/20 0506  NA 142 141 140 142  K 4.4 5.0 4.4 4.4  CL 102 100 97* 101  CO2 33* 36* 36* 37*  GLUCOSE 129* 140* 147* 169*  BUN 17 18 29* 26*  CREATININE 1.26* 1.51*  1.54* 1.50*  CALCIUM 8.6* 8.6* 8.8* 8.5*  MG  --  2.0 2.2 2.2  PHOS  --  2.1* 3.3 3.1   GFR: Estimated Creatinine Clearance: 80.6 mL/min (A) (by C-G formula based on SCr of 1.5 mg/dL (H)). Liver Function Tests: No results for input(s): AST, ALT, ALKPHOS, BILITOT, PROT, ALBUMIN in the last 168 hours. No results for input(s): LIPASE, AMYLASE in the last 168 hours. No results for input(s): AMMONIA in the last 168 hours. Coagulation Profile: No results for input(s): INR, PROTIME in the last 168 hours. Cardiac Enzymes: No results for input(s): CKTOTAL, CKMB, CKMBINDEX, TROPONINI in the last 168 hours. BNP (last 3 results) No results for input(s): PROBNP in the last 8760 hours. HbA1C: No results for input(s): HGBA1C in the last 72 hours. CBG: Recent Labs  Lab 09/01/20 2125 09/03/20 1649 09/03/20 2103 09/04/20 0743   GLUCAP 113* 121* 129* 139*   Lipid Profile: No results for input(s): CHOL, HDL, LDLCALC, TRIG, CHOLHDL, LDLDIRECT in the last 72 hours. Thyroid Function Tests: No results for input(s): TSH, T4TOTAL, FREET4, T3FREE, THYROIDAB in the last 72 hours. Anemia Panel: No results for input(s): VITAMINB12, FOLATE, FERRITIN, TIBC, IRON, RETICCTPCT in the last 72 hours. Sepsis Labs: Recent Labs  Lab 09/01/20 2135 09/02/20 0015 09/03/20 0520  PROCALCITON <0.10 <0.10 <0.10  LATICACIDVEN 1.5 1.9  --     Recent Results (from the past 240 hour(s))  Resp Panel by RT-PCR (Flu A&B, Covid) Nasopharyngeal Swab     Status: None   Collection Time: 09/01/20  7:14 PM   Specimen: Nasopharyngeal Swab; Nasopharyngeal(NP) swabs in vial transport medium  Result Value Ref Range Status   SARS Coronavirus 2 by RT PCR NEGATIVE NEGATIVE Final    Comment: (NOTE) SARS-CoV-2 target nucleic acids are NOT DETECTED.  The SARS-CoV-2 RNA is generally detectable in upper respiratory specimens during the acute phase of infection. The lowest concentration of SARS-CoV-2 viral copies this assay can detect is 138 copies/mL. A negative result does not preclude SARS-Cov-2 infection and should not be used as the sole basis for treatment or other patient management decisions. A negative result may occur with  improper specimen collection/handling, submission of specimen other than nasopharyngeal swab, presence of viral mutation(s) within the areas targeted by this assay, and inadequate number of viral copies(<138 copies/mL). A negative result must be combined with clinical observations, patient history, and epidemiological information. The expected result is Negative.  Fact Sheet for Patients:  EntrepreneurPulse.com.au  Fact Sheet for Healthcare Providers:  IncredibleEmployment.be  This test is no t yet approved or cleared by the [place] FDA and  has been authorized for detection  and/or diagnosis of SARS-CoV-2 by FDA under an Emergency Use Authorization (EUA). This EUA will remain  in effect (meaning this test can be used) for the duration of the COVID-19 declaration under Section 564(b)(1) of the Act, 21 U.S.C.section 360bbb-3(b)(1), unless the authorization is terminated  or revoked sooner.       Influenza A by PCR NEGATIVE NEGATIVE Final   Influenza B by PCR NEGATIVE NEGATIVE Final    Comment: (NOTE) The Xpert Xpress SARS-CoV-2/FLU/RSV plus assay is intended as an aid in the diagnosis of influenza from Nasopharyngeal swab specimens and should not be used as a sole basis for treatment. Nasal washings and aspirates are unacceptable for Xpert Xpress SARS-CoV-2/FLU/RSV testing.  Fact Sheet for Patients: EntrepreneurPulse.com.au  Fact Sheet for Healthcare Providers: IncredibleEmployment.be  This test is not yet approved or cleared by the [place] and  has been authorized for detection and/or diagnosis of SARS-CoV-2 by FDA under an Emergency Use Authorization (EUA). This EUA will remain in effect (meaning this test can be used) for the duration of the COVID-19 declaration under Section 564(b)(1) of the Act, 21 U.S.C. section 360bbb-3(b)(1), unless the authorization is terminated or revoked.  Performed at [facility], [address] [place], [place] [address]   MRSA Next Gen by PCR, Nasal     Status: None   Collection Time: 09/01/20  9:23 PM   Specimen: Nasal Mucosa; Nasal Swab  Result Value Ref Range Status   MRSA by PCR Next Gen NOT DETECTED NOT DETECTED Final    Comment: (NOTE) The GeneXpert MRSA Assay (FDA approved for NASAL specimens only), is one component of a comprehensive MRSA colonization surveillance program. It is not intended to diagnose MRSA infection nor to guide or monitor treatment for MRSA infections. Test performance is not FDA approved in patients less than 20  years old. Performed at [facility], [address] [place], [place] [address]   CULTURE, BLOOD (ROUTINE X 2) w Reflex to ID Panel     Status: None (Preliminary result)   Collection Time: 09/01/20  9:43 PM   Specimen: BLOOD  Result Value Ref Range Status   Specimen Description BLOOD RIGHT ASSIST CONTROL  Final   Special Requests   Final    BOTTLES DRAWN AEROBIC AND ANAEROBIC Blood Culture adequate volume   Culture   Final    NO GROWTH < 24 HOURS Performed at [facility], [address] [place], [place] [address]    Report Status PENDING  Incomplete  CULTURE, BLOOD (ROUTINE X 2) w Reflex to ID Panel     Status: None (Preliminary result)   Collection Time: 09/01/20  9:50 PM   Specimen: BLOOD  Result Value Ref Range Status   Specimen Description BLOOD RIGHT HAND  Final   Special Requests   Final    BOTTLES DRAWN AEROBIC ONLY Blood Culture results may not be optimal due to an inadequate volume of blood received in culture bottles   Culture   Final    NO GROWTH < 24 HOURS Performed at [facility], [address] [place], [place] [address]    Report Status PENDING  Incomplete         Radiology Studies: No results found.      Scheduled Meds:  apixaban  5 mg Oral BID   arformoterol  15 mcg Nebulization BID   aspirin EC  81 mg Oral Daily   atorvastatin  40 mg Oral QHS   budesonide (PULMICORT) nebulizer solution  0.5 mg Nebulization BID   Chlorhexidine Gluconate Cloth  6 each Topical Daily   furosemide  40 mg Intravenous Daily   insulin aspart  0-9 Units Subcutaneous TID WC   ipratropium-albuterol  3 mL Nebulization Q6H   melatonin  5 mg Oral QHS   metoprolol tartrate  100 mg Oral Daily   theophylline  300 mg Oral Q12H   Continuous Infusions:   LOS: 3 days    Time spent: 33 mins    Wyvonnia Dusky, MD [facility] Pager [phone]  If 7PM-7AM, please contact night-coverage 09/04/2020, 8:00 AM

## 2020-09-04 NOTE — TOC Initial Note (Signed)
Transition of Care [facility]) - Initial/Assessment Note    Patient Details  Name: [name] MRN: [id number] Date of Birth: 1970-09-06  [facility] CM/SW Contact:    [name] Phone Number: [phone] 09/04/2020, 2:28 PM  Clinical Narrative:                  Patient [facility] to [facility] due to increasing SOB.  Patient has capped trach at baseline and uses trilogy w/ mask to sleep.  Patient is completely independent w/ all ADLs. Patient's [name] (Spouse) [phone] (Mobile) is main care giver.  Ms. Mitchel stated, she believed patient's Trilogy is supplied by [employer] and trach supplies are supplied by [employer].  Ms. Eaden stated, she was told the recommendation for the patient is to have the Trilogy connected directly to the trach at bedtime.  Patient is not medically ready for discharge.       Patient Goals and CMS Choice        Expected Discharge Plan and Services                                                Prior Living Arrangements/Services                       Activities of Daily Living      Permission Sought/Granted                  Emotional Assessment              Admission diagnosis:  COPD exacerbation ([place]) [J44.1] Acute respiratory failure with hypercapnia (HCC) [J96.02] Acute on chronic respiratory failure with hypoxemia ([place]) [J96.21] Patient Active Problem List   Diagnosis Date Noted   Obesity hypoventilation syndrome ([place])    Acute on chronic respiratory failure with hypoxemia ([place]) 09/01/2020   Paroxysmal atrial fibrillation ([place]) 07/24/2020   Acute on chronic respiratory failure with hypercapnia (HCC)    COPD, severe ([place])    Sarcoidosis, lung ([place])    Obstructive sleep apnea    Tracheostomy status ([place])    Acute kidney failure, unspecified ([place])    Acute and chr resp failure, unsp w hypoxia or hypercapnia ([place]) 01/02/2020   Acute respiratory failure with hypoxia and hypercapnia  ([place]) 01/01/2020   CKD stage 3 due to type 2 diabetes mellitus ([place]) 01/01/2020   Respiratory failure with hypercapnia ([place]) 07/31/2019   Acute on chronic respiratory failure with hypoxia ([place]) 06/10/2019   Acute on chronic respiratory failure with hypoxia and hypercapnia ([place]) 11/17/2018   Acute exacerbation of CHF (congestive heart failure) ([place]) 11/07/2018   Chest pain 11/06/2018   Acute exacerbation of chronic obstructive pulmonary disease (COPD) ([place]) 07/29/2018   COPD with acute exacerbation ([place]) 02/03/2018   COPD (chronic obstructive pulmonary disease) ([place]) 07/16/2016   Chronic diastolic heart failure ([place]) 03/23/2016   Hypercarbia    COPD exacerbation ([place]) 11/17/2015   HTN (hypertension) 06/14/2015   Dental caries 03/08/2015   Morbid obesity ([place]) 02/21/2015   OSA and COPD overlap syndrome ([place]) 02/21/2015   Thrombocytopenia ([place]) 02/21/2015   Hypernatremia 02/21/2015   Type 2 diabetes mellitus ([place]) 10/19/2014   Chronic combined systolic and diastolic congestive heart failure ([place]) 10/18/2014   Chronic obstructive pulmonary disease ([place]) 10/18/2014   Sarcoidosis 04/22/2013   Hyperlipidemia 04/22/2013   PCP:  [place]:   [place] [address] [place] [address] [place]) [place] [address] Phone: [phone] Fax: [phone]     Social Determinants of Health (SDOH) Interventions    Readmission Risk Interventions Readmission Risk Prevention Plan 11/19/2018 07/30/2018 07/29/2018  Transportation Screening Complete Complete Complete  PCP or Specialist Appt within 3-5 Days - Complete Complete  HRI or [place] - Complete Complete  Social Work Consult for [place] Planning/Counseling - - Patient refused  Palliative Care Screening - Not Applicable Not Applicable  Medication Review Press photographer) Referral to Pharmacy Complete Complete  PCP or Specialist appointment  within 3-5 days of discharge Complete - -  [place] or Home Care Consult Patient refused - -  SW Recovery Care/Counseling Consult Complete - -  Palliative Care Screening Not Applicable - -  [place] Not Applicable - -  Some recent data might be hidden

## 2020-09-05 ENCOUNTER — Inpatient Hospital Stay: Payer: Medicare HMO

## 2020-09-05 DIAGNOSIS — E662 Morbid (severe) obesity with alveolar hypoventilation: Secondary | ICD-10-CM | POA: Diagnosis not present

## 2020-09-05 DIAGNOSIS — J9621 Acute and chronic respiratory failure with hypoxia: Secondary | ICD-10-CM | POA: Diagnosis not present

## 2020-09-05 DIAGNOSIS — I5022 Chronic systolic (congestive) heart failure: Secondary | ICD-10-CM | POA: Diagnosis not present

## 2020-09-05 LAB — BASIC METABOLIC PANEL
Anion gap: 6 (ref 5–15)
BUN: 24 mg/dL — ABNORMAL HIGH (ref 6–20)
CO2: 36 mmol/L — ABNORMAL HIGH (ref 22–32)
Calcium: 8.8 mg/dL — ABNORMAL LOW (ref 8.9–10.3)
Chloride: 98 mmol/L (ref 98–111)
Creatinine, Ser: 1.49 mg/dL — ABNORMAL HIGH (ref 0.61–1.24)
GFR, Estimated: 57 mL/min — ABNORMAL LOW (ref >60)
Glucose, Bld: 141 mg/dL — ABNORMAL HIGH (ref 70–99)
Potassium: 4.2 mmol/L (ref 3.5–5.1)
Sodium: 140 mmol/L (ref 135–145)

## 2020-09-05 LAB — CBC
HCT: 44.3 % (ref 39.0–52.0)
Hemoglobin: 14 g/dL (ref 13.0–17.0)
MCH: 28.3 pg (ref 26.0–34.0)
MCHC: 31.6 g/dL (ref 30.0–36.0)
MCV: 89.5 fL (ref 80.0–100.0)
Platelets: 199 10*3/uL (ref 150–400)
RBC: 4.95 MIL/uL (ref 4.22–5.81)
RDW: 13.2 % (ref 11.5–15.5)
WBC: 6.1 10*3/uL (ref 4.0–10.5)
nRBC: 0 % (ref 0.0–0.2)

## 2020-09-05 LAB — GLUCOSE, CAPILLARY
Glucose-Capillary: 147 mg/dL — ABNORMAL HIGH (ref 70–99)
Glucose-Capillary: 129 mg/dL — ABNORMAL HIGH (ref 70–99)
Glucose-Capillary: 120 mg/dL — ABNORMAL HIGH (ref 70–99)
Glucose-Capillary: 171 mg/dL — ABNORMAL HIGH (ref 70–99)

## 2020-09-05 LAB — MAGNESIUM: Magnesium: 2.3 mg/dL (ref 1.7–2.4)

## 2020-09-05 LAB — PHOSPHORUS: Phosphorus: 3.5 mg/dL (ref 2.5–4.6)

## 2020-09-05 NOTE — Progress Notes (Signed)
PROGRESS NOTE    [name]  [id number] DOB: 1970-08-02 DOA: 09/01/2020 PCP: [place]   Assessment & Plan:   Active Problems:   Acute on chronic respiratory failure with hypoxemia (HCC)   Obesity hypoventilation syndrome (HCC)   Acute on chronic hypoxic & hypercapnic respiratory failure: possibly due to mucous plug as per ICU. S/p trach which was placed in Nov 2021. Continue on trach collar & will likely require vent qhs. Pulmon consulted today in regards to using trilogy at home as pt is using vent qhs here. Continue on bronchodilators and suctioning prn   Chronic systolic CHF: appears euvolemic. Continue on IV lasix, metoprolol, aspirin & statin.  Continue to hold home dose of lisinopril. Monitor I/Os  PAF: continue on eliquis, metoprolol   AKI on CKDIIIa: Cr is trending down slightly from day prior.   Hypophosphatemia: resolved   DM2: likely poorly controlled. Continue on SSI w/ accuchecks   Morbid obesity: BMI 35.4. Complicates overall care & prognosis      DVT prophylaxis: eliquis  Code Status: full  Family Communication:  Disposition Plan: likely d/c back home   Level of care: Progressive Cardiac  Status is: Inpatient  Remains inpatient appropriate because:IV treatments appropriate due to intensity of illness or inability to take PO and Inpatient level of care appropriate due to severity of illness, needs equipment to be set for home respiratory needs prior to d/c   Dispo: The patient is from: Home              Anticipated d/c is to: Home              Patient currently is not medically stable to d/c.   Difficult to place patient Yes    Consultants:  ICU   Procedures:   Antimicrobials:   Subjective: Pt c/o malaise   Objective: Vitals:   09/05/20 0000 09/05/20 0100 09/05/20 0354 09/05/20 0750  BP:    114/79  Pulse: 86 81  63  Resp: (!) 21 (!) 35  16  Temp:    (!) 97.5 F (36.4 C)  TempSrc:    Oral  SpO2: 92% 99%  98% 100%  Weight:      Height:        Intake/Output Summary (Last 24 hours) at 09/05/2020 0815 Last data filed at 09/05/2020 0300 Gross per 24 hour  Intake 1080 ml  Output 2475 ml  Net -1395 ml   Filed Weights   09/01/20 2125 09/02/20 0221  Weight: 121.9 kg 121.9 kg    Examination:  General exam: Appears calm & comfortable  Respiratory system: diminished breath sounds b/l Cardiovascular system: S1/S2+. No rubs or clicks  Gastrointestinal system: Abd is soft, NT, obese & normal bowel sounds  Central nervous system: Alert and oriented. Moves all extremities  Psychiatry: Judgement and insight appear normal. Appropriate mood and affect    Data Reviewed: I have personally reviewed following labs and imaging studies  CBC: Recent Labs  Lab 09/01/20 1531 09/02/20 0015 09/03/20 0520 09/04/20 0506 09/05/20 0459  WBC 7.1 7.0 8.7 6.0 6.1  NEUTROABS  --   --  5.7  --   --   HGB 15.2 14.6 13.6 13.6 14.0  HCT 48.0 46.5 43.3 42.9 44.3  MCV 91.3 90.8 89.6 90.5 89.5  PLT 197 187 204 187 [id number]   Basic Metabolic Panel: Recent Labs  Lab 09/01/20 1531 09/02/20 0015 09/03/20 0520 09/04/20 0506 09/05/20 0459  NA 142 141 140 142 140  K 4.4 5.0 4.4 4.4 4.2  CL 102 100 97* 101 98  CO2 33* 36* 36* 37* 36*  GLUCOSE 129* 140* 147* 169* 141*  BUN 17 18 29* 26* 24*  CREATININE 1.26* 1.51* 1.54* 1.50* 1.49*  CALCIUM 8.6* 8.6* 8.8* 8.5* 8.8*  MG  --  2.0 2.2 2.2 2.3  PHOS  --  2.1* 3.3 3.1 3.5   GFR: Estimated Creatinine Clearance: 81.1 mL/min (A) (by C-G formula based on SCr of 1.49 mg/dL (H)). Liver Function Tests: No results for input(s): AST, ALT, ALKPHOS, BILITOT, PROT, ALBUMIN in the last 168 hours. No results for input(s): LIPASE, AMYLASE in the last 168 hours. No results for input(s): AMMONIA in the last 168 hours. Coagulation Profile: No results for input(s): INR, PROTIME in the last 168 hours. Cardiac Enzymes: No results for input(s): CKTOTAL, CKMB, CKMBINDEX, TROPONINI in  the last 168 hours. BNP (last 3 results) No results for input(s): PROBNP in the last 8760 hours. HbA1C: No results for input(s): HGBA1C in the last 72 hours. CBG: Recent Labs  Lab 09/04/20 0743 09/04/20 1122 09/04/20 1628 09/04/20 2203 09/05/20 0717  GLUCAP 139* 174* 150* 133* 147*   Lipid Profile: No results for input(s): CHOL, HDL, LDLCALC, TRIG, CHOLHDL, LDLDIRECT in the last 72 hours. Thyroid Function Tests: No results for input(s): TSH, T4TOTAL, FREET4, T3FREE, THYROIDAB in the last 72 hours. Anemia Panel: No results for input(s): VITAMINB12, FOLATE, FERRITIN, TIBC, IRON, RETICCTPCT in the last 72 hours. Sepsis Labs: Recent Labs  Lab 09/01/20 2135 09/02/20 0015 09/03/20 0520  PROCALCITON <0.10 <0.10 <0.10  LATICACIDVEN 1.5 1.9  --     Recent Results (from the past 240 hour(s))  Resp Panel by RT-PCR (Flu A&B, Covid) Nasopharyngeal Swab     Status: None   Collection Time: 09/01/20  7:14 PM   Specimen: Nasopharyngeal Swab; Nasopharyngeal(NP) swabs in vial transport medium  Result Value Ref Range Status   SARS Coronavirus 2 by RT PCR NEGATIVE NEGATIVE Final    Comment: (NOTE) SARS-CoV-2 target nucleic acids are NOT DETECTED.  The SARS-CoV-2 RNA is generally detectable in upper respiratory specimens during the acute phase of infection. The lowest concentration of SARS-CoV-2 viral copies this assay can detect is 138 copies/mL. A negative result does not preclude SARS-Cov-2 infection and should not be used as the sole basis for treatment or other patient management decisions. A negative result may occur with  improper specimen collection/handling, submission of specimen other than nasopharyngeal swab, presence of viral mutation(s) within the areas targeted by this assay, and inadequate number of viral copies(<138 copies/mL). A negative result must be combined with clinical observations, patient history, and epidemiological information. The expected result is  Negative.  Fact Sheet for Patients:  EntrepreneurPulse.com.au  Fact Sheet for Healthcare Providers:  IncredibleEmployment.be  This test is no t yet approved or cleared by the [place] FDA and  has been authorized for detection and/or diagnosis of SARS-CoV-2 by FDA under an Emergency Use Authorization (EUA). This EUA will remain  in effect (meaning this test can be used) for the duration of the COVID-19 declaration under Section 564(b)(1) of the Act, 21 U.S.C.section 360bbb-3(b)(1), unless the authorization is terminated  or revoked sooner.       Influenza A by PCR NEGATIVE NEGATIVE Final   Influenza B by PCR NEGATIVE NEGATIVE Final    Comment: (NOTE) The Xpert Xpress SARS-CoV-2/FLU/RSV plus assay is intended as an aid in the diagnosis of influenza from Nasopharyngeal swab specimens and should not be used  as a sole basis for treatment. Nasal washings and aspirates are unacceptable for Xpert Xpress SARS-CoV-2/FLU/RSV testing.  Fact Sheet for Patients: EntrepreneurPulse.com.au  Fact Sheet for Healthcare Providers: IncredibleEmployment.be  This test is not yet approved or cleared by the [place] FDA and has been authorized for detection and/or diagnosis of SARS-CoV-2 by FDA under an Emergency Use Authorization (EUA). This EUA will remain in effect (meaning this test can be used) for the duration of the COVID-19 declaration under Section 564(b)(1) of the Act, 21 U.S.C. section 360bbb-3(b)(1), unless the authorization is terminated or revoked.  Performed at [facility], [address] [place], [place] [address]   MRSA Next Gen by PCR, Nasal     Status: None   Collection Time: 09/01/20  9:23 PM   Specimen: Nasal Mucosa; Nasal Swab  Result Value Ref Range Status   MRSA by PCR Next Gen NOT DETECTED NOT DETECTED Final    Comment: (NOTE) The GeneXpert MRSA Assay (FDA approved for NASAL  specimens only), is one component of a comprehensive MRSA colonization surveillance program. It is not intended to diagnose MRSA infection nor to guide or monitor treatment for MRSA infections. Test performance is not FDA approved in patients less than 77 years old. Performed at [facility], [address] [place], [place] [address]   CULTURE, BLOOD (ROUTINE X 2) w Reflex to ID Panel     Status: None (Preliminary result)   Collection Time: 09/01/20  9:43 PM   Specimen: BLOOD  Result Value Ref Range Status   Specimen Description BLOOD RIGHT ASSIST CONTROL  Final   Special Requests   Final    BOTTLES DRAWN AEROBIC AND ANAEROBIC Blood Culture adequate volume   Culture   Final    NO GROWTH 4 DAYS Performed at [facility], [address], [place], [place] [address]    Report Status PENDING  Incomplete  CULTURE, BLOOD (ROUTINE X 2) w Reflex to ID Panel     Status: None (Preliminary result)   Collection Time: 09/01/20  9:50 PM   Specimen: BLOOD  Result Value Ref Range Status   Specimen Description BLOOD RIGHT HAND  Final   Special Requests   Final    BOTTLES DRAWN AEROBIC ONLY Blood Culture results may not be optimal due to an inadequate volume of blood received in culture bottles   Culture   Final    NO GROWTH 4 DAYS Performed at [facility], [address] [place], [place] [address]    Report Status PENDING  Incomplete         Radiology Studies: No results found.      Scheduled Meds:  apixaban  5 mg Oral BID   arformoterol  15 mcg Nebulization BID   aspirin EC  81 mg Oral Daily   atorvastatin  40 mg Oral QHS   budesonide (PULMICORT) nebulizer solution  0.5 mg Nebulization BID   Chlorhexidine Gluconate Cloth  6 each Topical Daily   furosemide  40 mg Intravenous Daily   insulin aspart  0-9 Units Subcutaneous TID WC   ipratropium-albuterol  3 mL Nebulization Q6H   melatonin  5 mg Oral QHS   metoprolol tartrate  100 mg Oral Daily    theophylline  300 mg Oral Q12H   Continuous Infusions:   LOS: 4 days    Time spent: 30  mins   Wyvonnia Dusky, MD [facility] Pager [phone]  If 7PM-7AM, please contact night-coverage 09/05/2020, 8:15 AM

## 2020-09-05 NOTE — Progress Notes (Signed)
Patient seen due to distress while on vent. BBS noted to have rhonci throughout. Unable to suction much from inline on vent. With assistance of RN manually bagged patient and lavage suctioned using saline. Able to retrieve large amount of thick white secretions during the process. No plugs noted however. BBS clear post suction. Placed patient back on vent and administered schedule tx in line. Patient tolerated efforts very well.

## 2020-09-05 NOTE — Progress Notes (Signed)
NAME:  [name], MRN:  [id number], DOB:  April 26, 1970, LOS: 4 ADMISSION DATE:  09/01/2020, CONSULTATION DATE:  09/01/2020 REFERRING MD:  Blake Divine MD. 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  Patient is currently on the vent therefore history is limited. Per EMS run sheet, no reports of associated symptoms of chest pain, fevers or chills, worsening productive cough from baseline.  ED Course: On arrival to the ED, he was afebrile with blood pressure 173/100 mm Hg and pulse rate 103 beats/min, RR 30 breaths per minute. Due to persistent hypoxia, patient was placed on the vent. He received solumedrol and Duonebs treatments as well. PCCM consulted to admit to ICU for further management.   Significant Hospital Events   7/28: admit to ICU with acute on chronic hypercapnic respiratory failure requiring ventilator support in the setting of a chronic tracheostomy. 7/29: Tolerated pressure support trial, placed on trach collar 09/05/20- patient is improved.  He still has cough.  Lung auscultation is imporved.  CXR today reviewed.  NIV care reviewed with patient.   Consults:  PCCM  Procedures:  None  Significant Diagnostic Tests:  7/28: Chest Xray>Increased hazy opacities of the LEFT lung base. Differential considerations include infection, aspiration or atelectasis  Micro Data:  7/28: SARS-CoV-2 PCR> negative 7/28: Influenza PCR> negative 7/28: Blood culture x2> 7/28: MRSA PCR> 7/28: Tracheal aspirate>  Antimicrobials:  NONE  OBJECTIVE  Blood pressure 114/79, pulse 63, temperature (!) 97.5 F (36.4 C), temperature source Oral, resp. rate 16, height '6\' 1"'$  (1.854 m), weight 121.9 kg, SpO2 100 %.    Vent  Mode: PRVC FiO2 (%):  [28 %-35 %] 28 % Set Rate:  [20 bmp] 20 bmp Vt Set:  [500 mL] 500 mL PEEP:  [5 cmH20] 5 cmH20 Plateau Pressure:  [22 cmH20] 22 cmH20   Intake/Output Summary (Last 24 hours) at 09/05/2020 0902 Last data filed at 09/05/2020 0300 Gross per 24 hour  Intake 1080 ml  Output 2475 ml  Net -1395 ml   Filed Weights   09/01/20 2125 09/02/20 0221  Weight: 121.9 kg 121.9 kg    Physical Examination  GENERAL: Middle-age obese African-American male, lying on the bed, on the vent via trach EYES: Pupils equal, round, reactive to light and accommodation. No scleral icterus. Extraocular muscles intact.  HEENT: Head atraumatic, normocephalic. Oropharynx and nasopharynx clear.  NECK:  Supple, no jugular venous distention. No thyroid enlargement, no tenderness. Trach incision intact LUNGS: rhonchi and crepitations worse at RLL CARDIOVASCULAR: S1, S2 normal. No murmurs, rubs, or gallops.  ABDOMEN: Soft, nontender, nondistended. Bowel sounds present. No organomegaly or mass.  EXTREMITIES: No pedal edema, cyanosis, or clubbing.  NEUROLOGIC: Cranial nerves II through XII are intact.  Muscle strength 5/5 in all extremities. Sensation intact. SKIN: No obvious rash, lesion, or ulcer.   Assessment & Plan:   Acute on chronic hypoxic/hypercapnic Respiratory Failure possibly due to mucous plug s/p trach PMHx: Severe COPD and Sarcoidosis, OSA, former smoker Patient is back to baseline He tolerated pressure support trial Currently on trach collar He had mucous plugging this morning, had aggressive suctioning Considering history of obstructive sleep apnea, I think patient will require nocturnal vent Acute COPD exacerbation was ruled out Continue chest PT Continue home  meds for COPD  Chronic systolic congestive Heart Failure NYHA class II (Last known EF 45-50%) Patient looks euvolemic Hypertension CAD s/p cardiac cath, HLD  Monitor intake and output Continue Lasix 40 mg IV At home he  takes 80 mg p.o. Holding lisinopril considering AKI Continue metoprolol, aspirin and atorvastatin  Paroxysmal Atrial Fibrillation Currently in sinus rhythm CHA2DS2-VASc score: 3 Continue Eliquis for stroke prophylaxis Continue Metoprolol for rate control once able to take po  Acute on CKD Stage III Hypophosphatemia Monitor I&O's / urinary output Closely monitor serum creatinine and electrolytes Avoid nephrotoxic agents as able Replace electrolytes as indicated   Diabetes mellitus Type ll -CBGs -Sliding scale insulin -Follow ICU hyper/hypoglycemia protocol  Best practice:  Diet:  NPO, speech and swallow evaluation Pain/Anxiety/Delirium protocol (if indicated): N/A VAP protocol (if indicated): Yes DVT prophylaxis: Systemic AC GI prophylaxis: H2B Glucose control:  SSI Yes Central venous access:  N/A Arterial line:  N/A Foley:  N/A Mobility: As tolerated PT consulted: Yes Last date of multidisciplinary goals of care discussion [7/28] Code Status:  full code Disposition: ICU   = Goals of Care = Code Status Order: FULL  Primary Emergency Contact: Francesconi,[name] Wishes to pursue full aggressive treatment and intervention options, including CPR and intubation, goals of care will be addressed on going with family if that should become necessary.   Total critical care time: 35 minutes    Critical care time was exclusive of separately billable procedures and treating other patients.   Critical care was necessary to treat or prevent imminent or life-threatening deterioration.   Critical care was time spent personally by me on the following activities: development of treatment plan with patient and/or surrogate as well as nursing, discussions with consultants, evaluation of patient's response to treatment, examination of patient, obtaining history from patient or surrogate, ordering and performing treatments and interventions, ordering and review of laboratory studies,  ordering and review of radiographic studies, pulse oximetry and re-evaluation of patient's condition.    Ottie Glazier, M.D.  [facility] [place]    .

## 2020-09-05 NOTE — Progress Notes (Signed)
Physical Therapy Treatment Patient Details Name: [name] MRN: [id number] DOB: 1970/04/25 Today's Date: 09/05/2020    History of Present Illness [name] is a [name] comes to [facility] on 08/31/20 c SOB. Pt admitted with hypercapnic resp failuer 2/2 COPD exacerbation. PMH: CHF (combined), COPD, CAD, MI, DM2, HLD, HTN, sarcoidosis, OSA, CVA, CRF  c tracheostomy on chronic 2L at home, current tobacco and polysubstance abuse.    PT Comments    Pt in bed upon entry, agreeable to session. RN calls RT to room to assist with setup of Venturi mask for transition from wall O2 to tank for AMB in hall. Pt AMB nearly 561f, baseline strength mobility. He is on 5L, 28%FiO2 upon entry, RT moves him to 8L, 40%FiO2 for mobility, no desat while AMB, 89-93%. Pt reports feels fairly good overall, still not on baseline flow rate. Pt moved back to wall O2 setup at EOS.      Follow Up Recommendations  No PT follow up     Equipment Recommendations  None recommended by PT    Recommendations for Other Services       Precautions / Restrictions Precautions Precautions: None Restrictions Weight Bearing Restrictions: No    Mobility  Bed Mobility Overal bed mobility: Independent                  Transfers Overall transfer level: Independent                  Ambulation/Gait Ambulation/Gait assistance: Modified independent (Device/Increase time);Supervision Gait Distance (Feet): 480 Feet Assistive device: None       General Gait Details: author assists with moving O2 from wall to venturi [employer]            Wheelchair Mobility    Modified Rankin (Stroke Patients Only)       Balance Overall balance assessment: Independent                                          Cognition Arousal/Alertness: Awake/alert Behavior During Therapy: WFL for tasks assessed/performed Overall Cognitive Status: Within Functional Limits for tasks assessed         Exercises      General Comments        Pertinent Vitals/Pain Pain Assessment: No/denies pain    Home Living         Prior Function            PT Goals (current goals can now be found in the care plan section) Acute Rehab PT Goals Patient Stated Goal: get stronger and able to stay off vent during day PT Goal Formulation: With patient Time For Goal Achievement: 09/16/20 Potential to Achieve Goals: Good Progress towards PT goals: Progressing toward goals    Frequency    Min 2X/week      PT Plan Current plan remains appropriate    Co-evaluation              AM-PAC PT "6 Clicks" Mobility   Outcome Measure  Help needed turning from your back to your side while in a flat bed without using bedrails?: None Help needed moving from lying on your back to sitting on the side of a flat bed without using bedrails?: None Help needed moving to and from a bed to a chair (including a wheelchair)?: None Help needed standing up from a  chair using your arms (e.g., wheelchair or bedside chair)?: None Help needed to walk in hospital room?: A Little Help needed climbing 3-5 steps with a railing? : A Little 6 Click Score: 22    End of Session Equipment Utilized During Treatment: Oxygen Activity Tolerance: Patient tolerated treatment well Patient left: in bed;with call bell/phone within reach Nurse Communication: Mobility status PT Visit Diagnosis: Other abnormalities of gait and mobility (R26.89);Difficulty in walking, not elsewhere classified (R26.2)     Time: [id number] PT Time Calculation (min) (ACUTE ONLY): 23 min  Charges:  $Therapeutic Exercise: 23-37 mins                    1:06 PM, 09/05/20 Etta Grandchild, PT, DPT Physical Therapist - [facility]  [phone] ([place])    [place] [facility] 09/05/2020, 1:03 PM

## 2020-09-05 NOTE — Consult Note (Signed)
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 Pharmacy consulted to assist with electrolyte monitoring and replacement as indicated.  Diuretics: IV Lasix 40 mg daily  Goal of Therapy:  Electrolytes within normal limits  Plan:  --Electrolytes have been stable and not requiring replacement --Will discontinue consult at this time. Defer further replacement and ordering of labs to primary provider --Pharmacy will continue to monitor peripherally  [name] 09/05/2020 8:11 AM

## 2020-09-06 ENCOUNTER — Telehealth: Payer: Self-pay

## 2020-09-06 DIAGNOSIS — I5022 Chronic systolic (congestive) heart failure: Secondary | ICD-10-CM | POA: Diagnosis not present

## 2020-09-06 DIAGNOSIS — J9621 Acute and chronic respiratory failure with hypoxia: Secondary | ICD-10-CM | POA: Diagnosis not present

## 2020-09-06 DIAGNOSIS — E662 Morbid (severe) obesity with alveolar hypoventilation: Secondary | ICD-10-CM | POA: Diagnosis not present

## 2020-09-06 LAB — BASIC METABOLIC PANEL
Anion gap: 5 (ref 5–15)
BUN: 24 mg/dL — ABNORMAL HIGH (ref 6–20)
CO2: 37 mmol/L — ABNORMAL HIGH (ref 22–32)
Calcium: 8.7 mg/dL — ABNORMAL LOW (ref 8.9–10.3)
Chloride: 97 mmol/L — ABNORMAL LOW (ref 98–111)
Creatinine, Ser: 1.48 mg/dL — ABNORMAL HIGH (ref 0.61–1.24)
GFR, Estimated: 57 mL/min — ABNORMAL LOW (ref >60)
Glucose, Bld: 146 mg/dL — ABNORMAL HIGH (ref 70–99)
Potassium: 4 mmol/L (ref 3.5–5.1)
Sodium: 139 mmol/L (ref 135–145)

## 2020-09-06 LAB — GLUCOSE, CAPILLARY
Glucose-Capillary: 153 mg/dL — ABNORMAL HIGH (ref 70–99)
Glucose-Capillary: 142 mg/dL — ABNORMAL HIGH (ref 70–99)
Glucose-Capillary: 122 mg/dL — ABNORMAL HIGH (ref 70–99)
Glucose-Capillary: 154 mg/dL — ABNORMAL HIGH (ref 70–99)

## 2020-09-06 LAB — CBC
HCT: 43 % (ref 39.0–52.0)
Hemoglobin: 13.3 g/dL (ref 13.0–17.0)
MCH: 27.2 pg (ref 26.0–34.0)
MCHC: 30.9 g/dL (ref 30.0–36.0)
MCV: 87.9 fL (ref 80.0–100.0)
Platelets: 204 10*3/uL (ref 150–400)
RBC: 4.89 MIL/uL (ref 4.22–5.81)
RDW: 13.1 % (ref 11.5–15.5)
WBC: 6.7 10*3/uL (ref 4.0–10.5)
nRBC: 0 % (ref 0.0–0.2)

## 2020-09-06 LAB — CULTURE, BLOOD (ROUTINE X 2)
Culture: NO GROWTH
Culture: NO GROWTH
Special Requests: ADEQUATE

## 2020-09-06 LAB — PHOSPHORUS: Phosphorus: 2.7 mg/dL (ref 2.5–4.6)

## 2020-09-06 LAB — MAGNESIUM: Magnesium: 2.2 mg/dL (ref 1.7–2.4)

## 2020-09-06 MED ORDER — BISOPROLOL FUMARATE 5 MG PO TABS
5.0000 mg | ORAL_TABLET | Freq: Every day | ORAL | Status: DC
  Administered 2020-09-07 – 2020-09-14 (×8): 5 mg via ORAL
  Filled 2020-09-06 (×8): qty 1

## 2020-09-06 NOTE — Significant Event (Signed)
Patient's Eliquis held in anticipation of bronchoscopy tomorrow. Restart once ok with Pulmonologist.  [name]

## 2020-09-06 NOTE — Progress Notes (Signed)
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   Patient is currently on the vent therefore history is limited. Per EMS run sheet, no reports of associated symptoms of chest pain, fevers or chills, worsening productive cough from baseline.   ED Course: On arrival to the ED, he was afebrile with blood pressure 173/100 mm Hg and pulse rate 103 beats/min, RR 30 breaths per minute. Due to persistent hypoxia, patient was placed on the vent. He received solumedrol and Duonebs treatments as well. PCCM consulted to admit to ICU for further management.   Hospital course from Dr. Jimmye Norman 7/30-09/06/20: Pt presented w/ shortness of breath likely secondary to mucous plug as per ICU. Pt was initially on ventilator and then placed on tach collar. Pt is on trach collar w/ oxygen during the day and vent qhs. Pulmon is following the pt and will determine when the pt is stable from the resp/pulmon standpoint and will determine the equipment the pt will need at d/c.     [name]  [id number] DOB: 04/22/1970 DOA: 09/01/2020 PCP: [place]   Assessment & Plan:   Active Problems:   Acute on chronic respiratory failure with hypoxemia (HCC)   Obesity hypoventilation syndrome (HCC)   Acute on chronic hypoxic & hypercapnic respiratory failure: possibly due to mucous plug as per ICU. S/p trach which was placed in Nov 2021. Continue on trach collar and continue w/ vent qhs. Continue on bronchodilators and suctioning prn. Pulmon following and recs apprec   Chronic systolic CHF: continue on IV lasix, bisoprolol, aspirin, statin. Monitor I/Os  PAF: continue on eliquis, bisoprolol  AKI on CKDIIIa: Cr is trending down  slightly from day prior. Avoid nephrotoxic meds   Hypophosphatemia: resolved   DM2: likely poorly controlled. Continue on SSI w/ accuchecks   Morbid obesity: BMI 35.4. Complicates overall care & prognosis      DVT prophylaxis: eliquis  Code Status: full  Family Communication:  Disposition Plan: likely d/c back home   Level of care: Progressive Cardiac  Status is: Inpatient  Remains inpatient appropriate because:IV treatments appropriate due to intensity of illness or inability to take PO and Inpatient level of care appropriate due to severity of illness, needs equipment to be set for home respiratory needs prior to d/c   Dispo: The patient is from: Home              Anticipated d/c is to: Home              Patient currently is not medically stable to d/c.   Difficult to place patient Yes    Consultants:  ICU   Procedures:   Antimicrobials:   Subjective: Pt c/o malaise   Objective: Vitals:   09/06/20 0017 09/06/20 0319 09/06/20 0447 09/06/20 0739  BP:  113/64  122/73  Pulse:  78  71  Resp:  20  18  Temp:  98.8 F (37.1 C)  98.7 F (37.1 C)  TempSrc:  Oral  Oral  SpO2: 99% 98% 100% 98%  Weight:      Height:        Intake/Output Summary (Last 24 hours) at 09/06/2020 [id number] Last data filed at 09/06/2020 0000 Gross per 24 hour  Intake  240 ml  Output 2750 ml  Net -2510 ml   Filed Weights   09/01/20 2125 09/02/20 0221  Weight: 121.9 kg 121.9 kg    Examination:  General exam: Appears comfortable  Respiratory system: decreased breath sounds b/l  Cardiovascular system: S1 & S2+. No rubs or clicks  Gastrointestinal system: Abd is soft, NT, obese & normal bowel sounds  Central nervous system:Alert and oriented. Moves all extremities Psychiatry: judgement and insight appear normal. Appropriate mood and affect    Data Reviewed: I have personally reviewed following labs and imaging studies  CBC: Recent Labs  Lab 09/02/20 0015 09/03/20 0520 09/04/20 0506  09/05/20 0459 09/06/20 0350  WBC 7.0 8.7 6.0 6.1 6.7  NEUTROABS  --  5.7  --   --   --   HGB 14.6 13.6 13.6 14.0 13.3  HCT 46.5 43.3 42.9 44.3 43.0  MCV 90.8 89.6 90.5 89.5 87.9  PLT 187 204 187 199 0000000   Basic Metabolic Panel: Recent Labs  Lab 09/02/20 0015 09/03/20 0520 09/04/20 0506 09/05/20 0459 09/06/20 0350  NA 141 140 142 140 139  K 5.0 4.4 4.4 4.2 4.0  CL 100 97* 101 98 97*  CO2 36* 36* 37* 36* 37*  GLUCOSE 140* 147* 169* 141* 146*  BUN 18 29* 26* 24* 24*  CREATININE 1.51* 1.54* 1.50* 1.49* 1.48*  CALCIUM 8.6* 8.8* 8.5* 8.8* 8.7*  MG 2.0 2.2 2.2 2.3 2.2  PHOS 2.1* 3.3 3.1 3.5 2.7   GFR: Estimated Creatinine Clearance: 81.7 mL/min (A) (by C-G formula based on SCr of 1.48 mg/dL (H)). Liver Function Tests: No results for input(s): AST, ALT, ALKPHOS, BILITOT, PROT, ALBUMIN in the last 168 hours. No results for input(s): LIPASE, AMYLASE in the last 168 hours. No results for input(s): AMMONIA in the last 168 hours. Coagulation Profile: No results for input(s): INR, PROTIME in the last 168 hours. Cardiac Enzymes: No results for input(s): CKTOTAL, CKMB, CKMBINDEX, TROPONINI in the last 168 hours. BNP (last 3 results) No results for input(s): PROBNP in the last 8760 hours. HbA1C: No results for input(s): HGBA1C in the last 72 hours. CBG: Recent Labs  Lab 09/05/20 0717 09/05/20 1159 09/05/20 1609 09/05/20 2141 09/06/20 0728  GLUCAP 147* 129* 120* 171* 153*   Lipid Profile: No results for input(s): CHOL, HDL, LDLCALC, TRIG, CHOLHDL, LDLDIRECT in the last 72 hours. Thyroid Function Tests: No results for input(s): TSH, T4TOTAL, FREET4, T3FREE, THYROIDAB in the last 72 hours. Anemia Panel: No results for input(s): VITAMINB12, FOLATE, FERRITIN, TIBC, IRON, RETICCTPCT in the last 72 hours. Sepsis Labs: Recent Labs  Lab 09/01/20 2135 09/02/20 0015 09/03/20 0520  PROCALCITON <0.10 <0.10 <0.10  LATICACIDVEN 1.5 1.9  --     Recent Results (from the past 240  hour(s))  Resp Panel by RT-PCR (Flu A&B, Covid) Nasopharyngeal Swab     Status: None   Collection Time: 09/01/20  7:14 PM   Specimen: Nasopharyngeal Swab; Nasopharyngeal(NP) swabs in vial transport medium  Result Value Ref Range Status   SARS Coronavirus 2 by RT PCR NEGATIVE NEGATIVE Final    Comment: (NOTE) SARS-CoV-2 target nucleic acids are NOT DETECTED.  The SARS-CoV-2 RNA is generally detectable in upper respiratory specimens during the acute phase of infection. The lowest concentration of SARS-CoV-2 viral copies this assay can detect is 138 copies/mL. A negative result does not preclude SARS-Cov-2 infection and should not be used as the sole basis for treatment or other patient management decisions. A negative result may occur with  improper specimen collection/handling, submission of specimen other than nasopharyngeal swab, presence of viral mutation(s) within the areas targeted by this assay, and inadequate number of viral copies(<138 copies/mL). A negative result must be combined with clinical observations, patient history, and epidemiological information. The expected result is Negative.  Fact Sheet for Patients:  EntrepreneurPulse.com.au  Fact Sheet for Healthcare Providers:  IncredibleEmployment.be  This test is no t yet approved or cleared by the [place] FDA and  has been authorized for detection and/or diagnosis of SARS-CoV-2 by FDA under an Emergency Use Authorization (EUA). This EUA will remain  in effect (meaning this test can be used) for the duration of the COVID-19 declaration under Section 564(b)(1) of the Act, 21 U.S.C.section 360bbb-3(b)(1), unless the authorization is terminated  or revoked sooner.       Influenza A by PCR NEGATIVE NEGATIVE Final   Influenza B by PCR NEGATIVE NEGATIVE Final    Comment: (NOTE) The Xpert Xpress SARS-CoV-2/FLU/RSV plus assay is intended as an aid in the diagnosis of influenza from  Nasopharyngeal swab specimens and should not be used as a sole basis for treatment. Nasal washings and aspirates are unacceptable for Xpert Xpress SARS-CoV-2/FLU/RSV testing.  Fact Sheet for Patients: EntrepreneurPulse.com.au  Fact Sheet for Healthcare Providers: IncredibleEmployment.be  This test is not yet approved or cleared by the [place] FDA and has been authorized for detection and/or diagnosis of SARS-CoV-2 by FDA under an Emergency Use Authorization (EUA). This EUA will remain in effect (meaning this test can be used) for the duration of the COVID-19 declaration under Section 564(b)(1) of the Act, 21 U.S.C. section 360bbb-3(b)(1), unless the authorization is terminated or revoked.  Performed at [facility], [address] [place], [place] [address]   MRSA Next Gen by PCR, Nasal     Status: None   Collection Time: 09/01/20  9:23 PM   Specimen: Nasal Mucosa; Nasal Swab  Result Value Ref Range Status   MRSA by PCR Next Gen NOT DETECTED NOT DETECTED Final    Comment: (NOTE) The GeneXpert MRSA Assay (FDA approved for NASAL specimens only), is one component of a comprehensive MRSA colonization surveillance program. It is not intended to diagnose MRSA infection nor to guide or monitor treatment for MRSA infections. Test performance is not FDA approved in patients less than 48 years old. Performed at [facility], [address] [place], [place] [address]   CULTURE, BLOOD (ROUTINE X 2) w Reflex to ID Panel     Status: None   Collection Time: 09/01/20  9:43 PM   Specimen: BLOOD  Result Value Ref Range Status   Specimen Description BLOOD RIGHT ASSIST CONTROL  Final   Special Requests   Final    BOTTLES DRAWN AEROBIC AND ANAEROBIC Blood Culture adequate volume   Culture   Final    NO GROWTH 5 DAYS Performed at [facility], [address] [place], [place] [address]    Report Status 09/06/2020 FINAL   Final  CULTURE, BLOOD (ROUTINE X 2) w Reflex to ID Panel     Status: None   Collection Time: 09/01/20  9:50 PM   Specimen: BLOOD  Result Value Ref Range Status   Specimen Description BLOOD RIGHT HAND  Final   Special Requests   Final    BOTTLES DRAWN AEROBIC ONLY Blood Culture results may not be optimal due to an inadequate volume of blood received in culture bottles   Culture   Final    NO GROWTH 5  DAYS Performed at [facility], [address] [place], [place] [address]    Report Status 09/06/2020 FINAL  Final         Radiology Studies: DG Chest Port 1 View  Result Date: 09/05/2020 CLINICAL DATA:  Abnormal chest radiograph, shortness of breath, cough EXAM: PORTABLE CHEST 1 VIEW COMPARISON:  09/01/2020 FINDINGS: No significant interval change in AP portable examination with bandlike scarring and or atelectasis of the right midlung and right lung base, small, layering left pleural effusion and elevation of the left hemidiaphragm, and mild cardiomegaly. Tracheostomy. IMPRESSION: No significant interval change in AP portable examination with bandlike scarring and or atelectasis of the right midlung and right lung base, small, layering left pleural effusion and elevation of the left hemidiaphragm, and mild cardiomegaly. No new airspace opacity. Electronically Signed   By: Eddie Candle M.D.   On: 09/05/2020 16:45        Scheduled Meds:  apixaban  5 mg Oral BID   arformoterol  15 mcg Nebulization BID   aspirin EC  81 mg Oral Daily   atorvastatin  40 mg Oral QHS   budesonide (PULMICORT) nebulizer solution  0.5 mg Nebulization BID   Chlorhexidine Gluconate Cloth  6 each Topical Daily   furosemide  40 mg Intravenous Daily   insulin aspart  0-9 Units Subcutaneous TID WC   ipratropium-albuterol  3 mL Nebulization Q6H   melatonin  5 mg Oral QHS   metoprolol tartrate  100 mg Oral Daily   theophylline  300 mg Oral Q12H   Continuous Infusions:   LOS: 5 days    Time spent:  31 mins   Wyvonnia Dusky, MD [facility] Pager [phone]  If 7PM-7AM, please contact night-coverage 09/06/2020, 8:42 AM

## 2020-09-06 NOTE — Progress Notes (Signed)
NAME:  [name], MRN:  [id number], DOB:  1970/12/06, LOS: 5 ADMISSION DATE:  09/01/2020, CONSULTATION DATE:  09/01/2020 REFERRING MD:  Blake Divine MD. 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  Patient is currently on the vent therefore history is limited. Per EMS run sheet, no reports of associated symptoms of chest pain, fevers or chills, worsening productive cough from baseline.  ED Course: On arrival to the ED, he was afebrile with blood pressure 173/100 mm Hg and pulse rate 103 beats/min, RR 30 breaths per minute. Due to persistent hypoxia, patient was placed on the vent. He received solumedrol and Duonebs treatments as well. PCCM consulted to admit to ICU for further management.   Significant Hospital Events   7/28: admit to ICU with acute on chronic hypercapnic respiratory failure requiring ventilator support in the setting of a chronic tracheostomy. 7/29: Tolerated pressure support trial, placed on trach collar 09/05/20- patient is improved.  He still has cough.  Lung auscultation is imporved.  CXR today reviewed.  NIV care reviewed with patient.  09/06/20- no overnight events. Discussed bronchoscopy today.  Spke with wife over phone and reviewed with his outpaitent pulmonologist  Consults:  PCCM  Procedures:  None  Significant Diagnostic Tests:  7/28: Chest Xray>Increased hazy opacities of the LEFT lung base. Differential considerations include infection, aspiration or atelectasis  Micro Data:  7/28: SARS-CoV-2 PCR> negative 7/28: Influenza PCR> negative 7/28: Blood culture x2> 7/28: MRSA PCR> 7/28: Tracheal aspirate>  Antimicrobials:  NONE  OBJECTIVE  Blood pressure 122/73, pulse 71,  temperature 98.7 F (37.1 C), temperature source Oral, resp. rate 18, height '6\' 1"'$  (1.854 m), weight 121.9 kg, SpO2 98 %.    Vent Mode: PRVC FiO2 (%):  [28 %-40 %] 28 % Set Rate:  [20 bmp] 20 bmp Vt Set:  [500 mL] 500 mL PEEP:  [5 cmH20] 5 cmH20   Intake/Output Summary (Last 24 hours) at 09/06/2020 0954 Last data filed at 09/06/2020 0920 Gross per 24 hour  Intake 240 ml  Output 3150 ml  Net -2910 ml    Filed Weights   09/01/20 2125 09/02/20 0221  Weight: 121.9 kg 121.9 kg    Physical Examination  GENERAL: Middle-age obese African-American male, lying on the bed, on the vent via trach EYES: Pupils equal, round, reactive to light and accommodation. No scleral icterus. Extraocular muscles intact.  HEENT: Head atraumatic, normocephalic. Oropharynx and nasopharynx clear.  NECK:  Supple, no jugular venous distention. No thyroid enlargement, no tenderness. Trach incision intact LUNGS: rhonchi and crepitations worse at RLL CARDIOVASCULAR: S1, S2 normal. No murmurs, rubs, or gallops.  ABDOMEN: Soft, nontender, nondistended. Bowel sounds present. No organomegaly or mass.  EXTREMITIES: No pedal edema, cyanosis, or clubbing.  NEUROLOGIC: Cranial nerves II through XII are intact.  Muscle strength 5/5 in all extremities. Sensation intact. SKIN: No obvious rash, lesion, or ulcer.   Assessment & Plan:   Acute on chronic hypoxic/hypercapnic Respiratory Failure possibly due to mucous plug s/p trach PMHx: Severe COPD and Sarcoidosis, OSA, former smoker Patient is back to baseline He tolerated pressure support trial Currently on trach collar He had mucous plugging this morning, had aggressive suctioning Considering history of obstructive sleep apnea, I think patient will require nocturnal  vent Acute COPD exacerbation was ruled out Continue chest PT Continue home meds for COPD  Chronic systolic congestive Heart Failure NYHA class II (Last known EF 45-50%) Patient looks  euvolemic Hypertension CAD s/p cardiac cath, HLD  Monitor intake and output Continue Lasix 40 mg IV At home he takes 80 mg p.o. Holding lisinopril considering AKI Continue metoprolol, aspirin and atorvastatin  Paroxysmal Atrial Fibrillation Currently in sinus rhythm CHA2DS2-VASc score: 3 Continue Eliquis for stroke prophylaxis Continue Metoprolol for rate control once able to take po  Acute on CKD Stage III Hypophosphatemia Monitor I&O's / urinary output Closely monitor serum creatinine and electrolytes Avoid nephrotoxic agents as able Replace electrolytes as indicated   Diabetes mellitus Type ll -CBGs -Sliding scale insulin -Follow ICU hyper/hypoglycemia protocol  Best practice:  Diet:  NPO, speech and swallow evaluation Pain/Anxiety/Delirium protocol (if indicated): N/A VAP protocol (if indicated): Yes DVT prophylaxis: Systemic AC GI prophylaxis: H2B Glucose control:  SSI Yes Central venous access:  N/A Arterial line:  N/A Foley:  N/A Mobility: As tolerated PT consulted: Yes Last date of multidisciplinary goals of care discussion [7/28] Code Status:  full code Disposition: ICU   = Goals of Care = Code Status Order: FULL  Primary Emergency Contact: Dunaj,[name] Wishes to pursue full aggressive treatment and intervention options, including CPR and intubation, goals of care will be addressed on going with family if that should become necessary.   Total critical care time: 35 minutes    Critical care time was exclusive of separately billable procedures and treating other patients.   Critical care was necessary to treat or prevent imminent or life-threatening deterioration.   Critical care was time spent personally by me on the following activities: development of treatment plan with patient and/or surrogate as well as nursing, discussions with consultants, evaluation of patient's response to treatment, examination of patient, obtaining history from patient or  surrogate, ordering and performing treatments and interventions, ordering and review of laboratory studies, ordering and review of radiographic studies, pulse oximetry and re-evaluation of patient's condition.    Ottie Glazier, M.D.  [facility] [place]    .

## 2020-09-06 NOTE — Telephone Encounter (Signed)
Contacted patient regarding the blood thinner and pulmonology clearance. Per Dr. Saralyn Pilar patient is cleared to stop Eliquis 3 days prior and restart 1 day after procedure. Per Dr. [name] (pulmonologist) patient is clear to have procedure. Pt verbalized understanding.

## 2020-09-06 NOTE — Progress Notes (Signed)
Occupational Therapy Treatment Patient Details Name: [name] MRN: [id number] DOB: 06-22-1970 Today's Date: 09/06/2020    History of present illness [name] is a [name] comes to [facility] on 08/31/20 c SOB. Pt admitted with hypercapnic resp failuer 2/2 COPD exacerbation. PMH: CHF (combined), COPD, CAD, MI, DM2, HLD, HTN, sarcoidosis, OSA, CVA, CRF  c tracheostomy on chronic 2L at home, current tobacco and polysubstance abuse.   OT comments  Upon entering the room, pt in bed with RN giving medications. Pt tolerating trach well and PMSV throughout session. Pt performs bed mobility without assistance to EOB. Pt performs 10 reps sit <>stand from EOB without use of UEs and supervision for safety. Pt taking seated rest break and then OT educated and demonstrated use of red theraband for B UE strengthening exercises. Pt's RR does increase to 39 at it's highest, pulse 130's, and O2 briefly drops to 87%. Pt recovers quickly with rest break. OT continues to discuss home set up and energy conservation techniques for home. Pt returns to bed at end of session with all needs within reach.   Follow Up Recommendations  Home health OT    Equipment Recommendations  None recommended by OT;Other (comment) (Pt has all needed equipment)       Precautions / Restrictions Precautions Precautions: None Restrictions Weight Bearing Restrictions: No       Mobility Bed Mobility Overal bed mobility: Independent                  Transfers Overall transfer level: Independent Equipment used: None Transfers: Sit to/from Stand Sit to Stand: Supervision         General transfer comment: close supervision for sit <>stand x 10 reps without UE use    Balance Overall balance assessment: Needs assistance Sitting-balance support: Feet supported Sitting balance-Leahy Scale: Good     Standing balance support: No upper extremity supported Standing balance-Leahy Scale: Fair                              ADL either performed or assessed with clinical judgement     Vision Patient Visual Report: No change from baseline            Cognition Arousal/Alertness: Awake/alert Behavior During Therapy: WFL for tasks assessed/performed Overall Cognitive Status: Within Functional Limits for tasks assessed                                 General Comments: Pt tolerates PMSV during entire session without issue                   Pertinent Vitals/ Pain       Pain Assessment: No/denies pain   Frequency  Min 2X/week        Progress Toward Goals  OT Goals(current goals can now be found in the care plan section)  Progress towards OT goals: Progressing toward goals  Acute Rehab OT Goals Patient Stated Goal: to get stronger and go home OT Goal Formulation: With patient Time For Goal Achievement: 09/16/20 Potential to Achieve Goals: Good  Plan Discharge plan remains appropriate;Frequency remains appropriate       AM-PAC OT "6 Clicks" Daily Activity     Outcome Measure   Help from another person eating meals?: None Help from another person taking care of personal grooming?: None Help from another person toileting, which includes  using toliet, bedpan, or urinal?: A Little Help from another person bathing (including washing, rinsing, drying)?: A Little Help from another person to put on and taking off regular upper body clothing?: None Help from another person to put on and taking off regular lower body clothing?: A Little 6 Click Score: 21    End of Session Equipment Utilized During Treatment: Other (comment) (trach)  OT Visit Diagnosis: Unsteadiness on feet (R26.81);Muscle weakness (generalized) (M62.81)   Activity Tolerance Patient tolerated treatment well   Patient Left in chair;with call bell/phone within reach   Nurse Communication Mobility status        Time: [id number] OT Time Calculation (min): 32 min  Charges: OT General  Charges $OT Visit: 1 Visit OT Treatments $Therapeutic Activity: 8-22 mins $Therapeutic Exercise: 8-22 mins  Darleen Crocker, MS, OTR/L , CBIS ascom [phone]  09/06/20, 2:35 PM

## 2020-09-07 DIAGNOSIS — E785 Hyperlipidemia, unspecified: Secondary | ICD-10-CM

## 2020-09-07 DIAGNOSIS — I5022 Chronic systolic (congestive) heart failure: Secondary | ICD-10-CM | POA: Diagnosis not present

## 2020-09-07 DIAGNOSIS — E669 Obesity, unspecified: Secondary | ICD-10-CM

## 2020-09-07 DIAGNOSIS — I480 Paroxysmal atrial fibrillation: Secondary | ICD-10-CM | POA: Diagnosis not present

## 2020-09-07 DIAGNOSIS — N189 Chronic kidney disease, unspecified: Secondary | ICD-10-CM

## 2020-09-07 DIAGNOSIS — E1169 Type 2 diabetes mellitus with other specified complication: Secondary | ICD-10-CM

## 2020-09-07 DIAGNOSIS — N179 Acute kidney failure, unspecified: Secondary | ICD-10-CM

## 2020-09-07 DIAGNOSIS — J9622 Acute and chronic respiratory failure with hypercapnia: Secondary | ICD-10-CM

## 2020-09-07 DIAGNOSIS — J9621 Acute and chronic respiratory failure with hypoxia: Secondary | ICD-10-CM | POA: Diagnosis not present

## 2020-09-07 LAB — BASIC METABOLIC PANEL
Anion gap: 6 (ref 5–15)
BUN: 24 mg/dL — ABNORMAL HIGH (ref 6–20)
CO2: 34 mmol/L — ABNORMAL HIGH (ref 22–32)
Calcium: 8.7 mg/dL — ABNORMAL LOW (ref 8.9–10.3)
Chloride: 100 mmol/L (ref 98–111)
Creatinine, Ser: 1.59 mg/dL — ABNORMAL HIGH (ref 0.61–1.24)
GFR, Estimated: 53 mL/min — ABNORMAL LOW (ref >60)
Glucose, Bld: 138 mg/dL — ABNORMAL HIGH (ref 70–99)
Potassium: 3.9 mmol/L (ref 3.5–5.1)
Sodium: 140 mmol/L (ref 135–145)

## 2020-09-07 LAB — GLUCOSE, CAPILLARY
Glucose-Capillary: 124 mg/dL — ABNORMAL HIGH (ref 70–99)
Glucose-Capillary: 122 mg/dL — ABNORMAL HIGH (ref 70–99)
Glucose-Capillary: 268 mg/dL — ABNORMAL HIGH (ref 70–99)
Glucose-Capillary: 117 mg/dL — ABNORMAL HIGH (ref 70–99)

## 2020-09-07 LAB — CBC
HCT: 40.8 % (ref 39.0–52.0)
Hemoglobin: 13 g/dL (ref 13.0–17.0)
MCH: 28.3 pg (ref 26.0–34.0)
MCHC: 31.9 g/dL (ref 30.0–36.0)
MCV: 88.9 fL (ref 80.0–100.0)
Platelets: 192 10*3/uL (ref 150–400)
RBC: 4.59 MIL/uL (ref 4.22–5.81)
RDW: 13.2 % (ref 11.5–15.5)
WBC: 5.3 10*3/uL (ref 4.0–10.5)
nRBC: 0.4 % — ABNORMAL HIGH (ref 0.0–0.2)

## 2020-09-07 LAB — PHOSPHORUS: Phosphorus: 2.9 mg/dL (ref 2.5–4.6)

## 2020-09-07 LAB — MAGNESIUM: Magnesium: 2.1 mg/dL (ref 1.7–2.4)

## 2020-09-07 MED ORDER — FENTANYL CITRATE (PF) 100 MCG/2ML IJ SOLN: 50.0000 ug | Freq: Once | INTRAMUSCULAR | Status: AC

## 2020-09-07 MED ORDER — FENTANYL CITRATE (PF) 100 MCG/2ML IJ SOLN
INTRAMUSCULAR | Status: AC
  Administered 2020-09-07: 50 ug via INTRAVENOUS
  Filled 2020-09-07: qty 2

## 2020-09-07 MED ORDER — MIDAZOLAM HCL 2 MG/2ML IJ SOLN
4.0000 mg | Freq: Once | INTRAMUSCULAR | Status: AC
  Administered 2020-09-07: 4 mg via INTRAVENOUS

## 2020-09-07 MED ORDER — MIDAZOLAM HCL 2 MG/2ML IJ SOLN
INTRAMUSCULAR | Status: AC
  Filled 2020-09-07: qty 4

## 2020-09-07 MED ORDER — FUROSEMIDE 20 MG PO TABS
40.0000 mg | ORAL_TABLET | Freq: Every day | ORAL | Status: DC
  Administered 2020-09-08 – 2020-09-14 (×7): 40 mg via ORAL
  Filled 2020-09-07 (×8): qty 2

## 2020-09-07 MED ORDER — PANTOPRAZOLE SODIUM 40 MG PO TBEC
40.0000 mg | DELAYED_RELEASE_TABLET | Freq: Every day | ORAL | Status: DC
  Administered 2020-09-07 – 2020-09-14 (×8): 40 mg via ORAL
  Filled 2020-09-07 (×8): qty 1

## 2020-09-07 NOTE — Procedures (Addendum)
PROCEDURE: BRONCHOSCOPY Therapeutic Aspiration of Tracheobronchial Tree and Bronchoalveolar lavage  PROCEDURE DATE: 09/07/2020  TIME:  NAME:  [name]  DOB:02-Sep-1970  MRN: [id number] LOC:  IC08A/IC08A-AA    HOSP DAY: _0 @ CODE STATUS:      Code Status Orders  (From admission, onward)           Start     Ordered   09/01/20 2001  Full code  Continuous        09/01/20 2002           Code Status History     Date Active Date Inactive Code Status Order ID Comments User Context   07/23/2020 2053 07/26/2020 1904 Full Code [id number]  Rust-Chester, Huel Cote, NP ED   01/26/2020 2143 02/16/2020 1855 Full Code [id number]  Maudry Diego Inpatient   01/01/2020 1729 01/26/2020 1725 Full Code [id number]  Reubin Milan, MD ED   07/31/2019 1601 08/02/2019 1654 Full Code [id number]  Collier Bullock, MD ED   06/10/2019 1517 06/14/2019 1930 Full Code [id number]  Wyvonnia Dusky, MD ED   11/17/2018 1013 11/21/2018 1826 Full Code [id number]  Epifanio Lesches, MD ED   11/06/2018 0957 11/08/2018 1555 Full Code [id number]  Harrie Foreman, MD ED   07/29/2018 0034 07/30/2018 1938 Full Code [id number]  Mayer Camel, NP ED   02/03/2018 2201 02/06/2018 1552 Full Code [id number]  Dustin Flock, MD Inpatient   07/15/2016 0632 07/16/2016 1741 Full Code [id number]  Saundra Shelling, MD Inpatient   07/01/2016 1701 07/03/2016 1853 Full Code [id number]  Henreitta Leber, MD Inpatient   03/26/2016 0043 03/28/2016 2001 Full Code [id number]  Hugelmeyer, [place], DO Inpatient   11/17/2015 1131 11/18/2015 1845 Full Code [id number]  Bettey Costa, MD Inpatient   02/11/2015 1417 02/21/2015 1841 Full Code [id number]  Henreitta Leber, MD Inpatient   12/28/2014 2134 12/30/2014 1602 Full Code [id number]  Fritzi Mandes, MD Inpatient   08/24/2014 0245 08/26/2014 1819 Full Code [id number]  Lytle Butte, MD ED   08/14/2014 1031 08/18/2014 1830 Full Code [id number]  Vaughan Basta, MD Inpatient            Indications/Preliminary Diagnosis:   Consent: (Place X beside choice/s below)  The benefits, risks and possible complications of the procedure were        explained to:  _x__ patient  ___ patient's family  ___ other:___________  who verbalized understanding and gave:  __x_ verbal  ___ written  ___ verbal and written  ___ telephone  ___ other:________ consent.      Unable to obtain consent; procedure performed on emergent basis.     Other:       PRESEDATION ASSESSMENT: History and Physical has been performed. Patient meds and allergies have been reviewed. Presedation airway examination has been performed and documented. Baseline vital signs, sedation score, oxygenation status, and cardiac rhythm were reviewed. Patient was deemed to be in satisfactory condition to undergo the procedure.    PREMEDICATIONS:   Sedative/Narcotic Amt Dose   Versed 2 mg   Fentanyl 25 mcg  Diprivan  mg            PROCEDURE DETAILS: Timeout performed and correct patient, name, & ID confirmed. Following prep per Pulmonary policy, appropriate sedation was administered. The Bronchoscope was inserted in to oral cavity with bite block in place. Therapeutic aspiration of Tracheobronchial tree was performed.  Airway exam proceeded with findings, technical procedures, and specimen collection as noted below. At the end of exam the scope was  withdrawn without incident. Impression and Plan as noted below.           Airway Prep (Place X beside choice below)   1% Transtracheal Lidocaine Anesthetization 7 cc   Patient prepped per Bronchoscopy Lab Policy       Insertion Route (Place X beside choice below)   Nasal   Oral   Endotracheal Tube  x Tracheostomy   INTRAPROCEDURE MEDICATIONS:  Sedative/Narcotic Amt Dose   Versed 2 mg   Fentanyl 50 mcg  Diprivan  mg       Medication Amt Dose  Medication Amt Dose  Lidocaine 1%  cc  Epinephrine 1:10,000 sol  cc  Xylocaine 4%  cc  Cocaine  cc    TECHNICAL PROCEDURES: (Place X beside choice below)   Procedures  Description    None     Electrocautery     Cryotherapy     Balloon Dilatation     Bronchography     Stent Placement     Therapeutic Aspiration RUL and LLL    Laser/Argon Plasma    Brachytherapy Catheter Placement    Foreign Body Removal         SPECIMENS (Sites): (Place X beside choice below)  Specimens Description   No Specimens Obtained     Washings    Lavage X1 at RML   Biopsies    Fine Needle Aspirates    Brushings    Sputum    FINDINGS:  inspissated muccus plugging worse at LLL and RUL which was aspirated and evacuated.   BAL at RML was performed with microbiology specimen pending.  ESTIMATED BLOOD LOSS: none COMPLICATIONS/RESOLUTION: none      IMPRESSION:POST-PROCEDURE DX:     RECOMMENDATION/PLAN:   Await cultures, contineu with NIV trial overnight while in MICU as discussed with patient and wife.     Ottie Glazier, M.D.  [facility] [place]

## 2020-09-07 NOTE — Plan of Care (Signed)

## 2020-09-07 NOTE — Progress Notes (Signed)
NAME:  [name], MRN:  [id number], DOB:  1971/02/05, LOS: 6 ADMISSION DATE:  09/01/2020, CONSULTATION DATE:  09/01/2020 REFERRING MD:  Blake Divine MD. 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  Patient is currently on the vent therefore history is limited. Per EMS run sheet, no reports of associated symptoms of chest pain, fevers or chills, worsening productive cough from baseline.  ED Course: On arrival to the ED, he was afebrile with blood pressure 173/100 mm Hg and pulse rate 103 beats/min, RR 30 breaths per minute. Due to persistent hypoxia, patient was placed on the vent. He received solumedrol and Duonebs treatments as well. PCCM consulted to admit to ICU for further management.   Significant Hospital Events   7/28: admit to ICU with acute on chronic hypercapnic respiratory failure requiring ventilator support in the setting of a chronic tracheostomy. 7/29: Tolerated pressure support trial, placed on trach collar 09/05/20- patient is improved.  He still has cough.  Lung auscultation is imporved.  CXR today reviewed.  NIV care reviewed with patient.  09/06/20- no overnight events. Discussed bronchoscopy today.  Spke with wife over phone and reviewed with his outpaitent pulmonologist 09/07/20- patient improved , he had bronchoscopy today with removal of mucus plugging and plan for home device ResMED iVAPS trial tonight in MICU  Consults:  PCCM  Procedures:  None  Significant Diagnostic Tests:  7/28: Chest Xray>Increased hazy opacities of the LEFT lung base. Differential considerations include infection, aspiration or atelectasis  Micro Data:  7/28: SARS-CoV-2 PCR> negative 7/28: Influenza PCR>  negative 7/28: Blood culture x2> 7/28: MRSA PCR> 7/28: Tracheal aspirate>  Antimicrobials:  NONE  OBJECTIVE  Blood pressure 110/73, pulse 70, temperature 97.9 F (36.6 C), temperature source Axillary, resp. rate 20, height '6\' 1"'$  (1.854 m), weight 121.8 kg, SpO2 100 %.    Vent Mode: PRVC FiO2 (%):  [28 %-35 %] 35 % Set Rate:  [20 bmp] 20 bmp Vt Set:  [500 mL] 500 mL PEEP:  [5 cmH20] 5 cmH20   Intake/Output Summary (Last 24 hours) at 09/07/2020 [id number] Last data filed at 09/07/2020 0930 Gross per 24 hour  Intake 480 ml  Output 2205 ml  Net -1725 ml    Filed Weights   09/01/20 2125 09/02/20 0221 09/07/20 0500  Weight: 121.9 kg 121.9 kg 121.8 kg    Physical Examination  GENERAL: Middle-age obese African-American male, lying on the bed, on the vent via trach EYES: Pupils equal, round, reactive to light and accommodation. No scleral icterus. Extraocular muscles intact.  HEENT: Head atraumatic, normocephalic. Oropharynx and nasopharynx clear.  NECK:  Supple, no jugular venous distention. No thyroid enlargement, no tenderness. Trach incision intact LUNGS: rhonchi and crepitations worse at RLL CARDIOVASCULAR: S1, S2 normal. No murmurs, rubs, or gallops.  ABDOMEN: Soft, nontender, nondistended. Bowel sounds present. No organomegaly or mass.  EXTREMITIES: No pedal edema, cyanosis, or clubbing.  NEUROLOGIC: Cranial nerves II through XII are intact.  Muscle strength 5/5 in all extremities. Sensation intact. SKIN: No obvious rash, lesion, or ulcer.   Assessment & Plan:   Acute on chronic hypoxic/hypercapnic Respiratory Failure possibly due to mucous plug s/p trach PMHx: Severe COPD and Sarcoidosis, OSA, former smoker Patient is back to baseline He tolerated  pressure support trial Currently on trach collar He had mucous plugging this morning, had aggressive suctioning Considering history of obstructive sleep apnea, I think patient will require nocturnal vent Acute COPD exacerbation was  ruled out Continue chest PT Continue home meds for COPD  Chronic systolic congestive Heart Failure NYHA class II (Last known EF 45-50%) Patient looks euvolemic Hypertension CAD s/p cardiac cath, HLD  Monitor intake and output Continue Lasix 40 mg IV At home he takes 80 mg p.o. Holding lisinopril considering AKI Continue metoprolol, aspirin and atorvastatin  Paroxysmal Atrial Fibrillation Currently in sinus rhythm CHA2DS2-VASc score: 3 Continue Eliquis for stroke prophylaxis Continue Metoprolol for rate control once able to take po  Acute on CKD Stage III Hypophosphatemia Monitor I&O's / urinary output Closely monitor serum creatinine and electrolytes Avoid nephrotoxic agents as able Replace electrolytes as indicated   Diabetes mellitus Type ll -CBGs -Sliding scale insulin -Follow ICU hyper/hypoglycemia protocol  Best practice:  Diet:  NPO, speech and swallow evaluation Pain/Anxiety/Delirium protocol (if indicated): N/A VAP protocol (if indicated): Yes DVT prophylaxis: Systemic AC GI prophylaxis: H2B Glucose control:  SSI Yes Central venous access:  N/A Arterial line:  N/A Foley:  N/A Mobility: As tolerated PT consulted: Yes Last date of multidisciplinary goals of care discussion [7/28] Code Status:  full code Disposition: ICU   = Goals of Care = Code Status Order: FULL  Primary Emergency Contact: Holderman,[name] Wishes to pursue full aggressive treatment and intervention options, including CPR and intubation, goals of care will be addressed on going with family if that should become necessary.   Total critical care time: 35 minutes    Critical care time was exclusive of separately billable procedures and treating other patients.   Critical care was necessary to treat or prevent imminent or life-threatening deterioration.   Critical care was time spent personally by me on the following activities: development of treatment plan with patient and/or surrogate  as well as nursing, discussions with consultants, evaluation of patient's response to treatment, examination of patient, obtaining history from patient or surrogate, ordering and performing treatments and interventions, ordering and review of laboratory studies, ordering and review of radiographic studies, pulse oximetry and re-evaluation of patient's condition.    Ottie Glazier, M.D.  [facility]    .

## 2020-09-07 NOTE — Progress Notes (Signed)
Patient alert and requesting to go back onto ATC. Placed back on ATC @ 28%.

## 2020-09-07 NOTE — Progress Notes (Signed)
Patient to have planned bronch tomorrow. Eliquis held per physician order for procedure. Will continue to monitor.

## 2020-09-07 NOTE — Progress Notes (Signed)
Per notes, patient to be placed on home vent trial for tonight. Dr. Damita Dunnings contacted for orders to be placed. Awaiting further instructions.

## 2020-09-07 NOTE — Progress Notes (Signed)
Patient ID: [name], male   DOB: Jun 26, 1970, 50 y.o.   MRN: [id number] [facility] PROGRESS NOTE  ARLYN JESSUP [id number] DOB: [birth year] DOA: 09/01/2020 PCP: [place]  HPI/Subjective: Patient seen right before bronchoscopy.  He was on the ventilator at the time through the tracheostomy.  Patient with repeated hospital admissions for hypercapnic respiratory failure.  Shortness of breath with activities.  Slight cough.  No nausea or vomiting or abdominal pain.  Objective: Vitals:   09/07/20 1247 09/07/20 1413  BP:    Pulse:    Resp:    Temp:    SpO2: 100% 96%    Intake/Output Summary (Last 24 hours) at 09/07/2020 1434 Last data filed at 09/07/2020 0930 Gross per 24 hour  Intake 480 ml  Output 1005 ml  Net -525 ml   Filed Weights   09/01/20 2125 09/02/20 0221 09/07/20 0500  Weight: 121.9 kg 121.9 kg 121.8 kg    ROS: Review of Systems  Respiratory:  Positive for shortness of breath.   Cardiovascular:  Negative for chest pain.  Gastrointestinal:  Negative for abdominal pain, nausea and vomiting.  Exam: Physical Exam HENT:     Head: Normocephalic.     Mouth/Throat:     Pharynx: No oropharyngeal exudate.  Eyes:     General: Lids are normal.     Conjunctiva/sclera: Conjunctivae normal.  Cardiovascular:     Rate and Rhythm: Normal rate and regular rhythm.     Heart sounds: Normal heart sounds, S1 normal and S2 normal.  Pulmonary:     Breath sounds: Examination of the right-lower field reveals decreased breath sounds and rhonchi. Examination of the left-lower field reveals decreased breath sounds and rhonchi. Decreased breath sounds and rhonchi present. No wheezing or rales.  Abdominal:     Palpations: Abdomen is soft.     Tenderness: There is no abdominal tenderness.  Musculoskeletal:     Right lower leg: No swelling.     Left lower leg: No swelling.  Skin:    General: Skin is warm.     Findings: No rash.  Neurological:     Mental  Status: He is alert and oriented to person, place, and time.     Data Reviewed: Basic Metabolic Panel: Recent Labs  Lab 09/03/20 0520 09/04/20 0506 09/05/20 0459 09/06/20 0350 09/07/20 0445  NA 140 142 140 139 140  K 4.4 4.4 4.2 4.0 3.9  CL 97* 101 98 97* 100  CO2 36* 37* 36* 37* 34*  GLUCOSE 147* 169* 141* 146* 138*  BUN 29* 26* 24* 24* 24*  CREATININE 1.54* 1.50* 1.49* 1.48* 1.59*  CALCIUM 8.8* 8.5* 8.8* 8.7* 8.7*  MG 2.2 2.2 2.3 2.2 2.1  PHOS 3.3 3.1 3.5 2.7 2.9    CBC: Recent Labs  Lab 09/03/20 0520 09/04/20 0506 09/05/20 0459 09/06/20 0350 09/07/20 0445  WBC 8.7 6.0 6.1 6.7 5.3  NEUTROABS 5.7  --   --   --   --   HGB 13.6 13.6 14.0 13.3 13.0  HCT 43.3 42.9 44.3 43.0 40.8  MCV 89.6 90.5 89.5 87.9 88.9  PLT 204 187 199 204 192    BNP (last 3 results) Recent Labs    01/01/20 2028 07/23/20 1734 09/01/20 1531  BNP 102.4* 218.9* 113.9*     CBG: Recent Labs  Lab 09/06/20 1132 09/06/20 1643 09/06/20 2203 09/07/20 0737 09/07/20 1130  GLUCAP 142* 122* 154* 124* 122*    Recent Results (from the past 240 hour(s))  Resp Panel by RT-PCR (Flu A&B, Covid) Nasopharyngeal Swab     Status: None   Collection Time: 09/01/20  7:14 PM   Specimen: Nasopharyngeal Swab; Nasopharyngeal(NP) swabs in vial transport medium  Result Value Ref Range Status   SARS Coronavirus 2 by RT PCR NEGATIVE NEGATIVE Final    Comment: (NOTE) SARS-CoV-2 target nucleic acids are NOT DETECTED.  The SARS-CoV-2 RNA is generally detectable in upper respiratory specimens during the acute phase of infection. The lowest concentration of SARS-CoV-2 viral copies this assay can detect is 138 copies/mL. A negative result does not preclude SARS-Cov-2 infection and should not be used as the sole basis for treatment or other patient management decisions. A negative result may occur with  improper specimen collection/handling, submission of specimen other than nasopharyngeal swab, presence of  viral mutation(s) within the areas targeted by this assay, and inadequate number of viral copies(<138 copies/mL). A negative result must be combined with clinical observations, patient history, and epidemiological information. The expected result is Negative.  Fact Sheet for Patients:  EntrepreneurPulse.com.au  Fact Sheet for Healthcare Providers:  IncredibleEmployment.be  This test is no t yet approved or cleared by the [place] FDA and  has been authorized for detection and/or diagnosis of SARS-CoV-2 by FDA under an Emergency Use Authorization (EUA). This EUA will remain  in effect (meaning this test can be used) for the duration of the COVID-19 declaration under Section 564(b)(1) of the Act, 21 U.S.C.section 360bbb-3(b)(1), unless the authorization is terminated  or revoked sooner.       Influenza A by PCR NEGATIVE NEGATIVE Final   Influenza B by PCR NEGATIVE NEGATIVE Final    Comment: (NOTE) The Xpert Xpress SARS-CoV-2/FLU/RSV plus assay is intended as an aid in the diagnosis of influenza from Nasopharyngeal swab specimens and should not be used as a sole basis for treatment. Nasal washings and aspirates are unacceptable for Xpert Xpress SARS-CoV-2/FLU/RSV testing.  Fact Sheet for Patients: EntrepreneurPulse.com.au  Fact Sheet for Healthcare Providers: IncredibleEmployment.be  This test is not yet approved or cleared by the [place] FDA and has been authorized for detection and/or diagnosis of SARS-CoV-2 by FDA under an Emergency Use Authorization (EUA). This EUA will remain in effect (meaning this test can be used) for the duration of the COVID-19 declaration under Section 564(b)(1) of the Act, 21 U.S.C. section 360bbb-3(b)(1), unless the authorization is terminated or revoked.  Performed at [facility], [address] [place], [place] [address]   MRSA Next Gen by PCR,  Nasal     Status: None   Collection Time: 09/01/20  9:23 PM   Specimen: Nasal Mucosa; Nasal Swab  Result Value Ref Range Status   MRSA by PCR Next Gen NOT DETECTED NOT DETECTED Final    Comment: (NOTE) The GeneXpert MRSA Assay (FDA approved for NASAL specimens only), is one component of a comprehensive MRSA colonization surveillance program. It is not intended to diagnose MRSA infection nor to guide or monitor treatment for MRSA infections. Test performance is not FDA approved in patients less than 76 years old. Performed at [facility], [place], [place] [address]   CULTURE, BLOOD (ROUTINE X 2) w Reflex to ID Panel     Status: None   Collection Time: 09/01/20  9:43 PM   Specimen: BLOOD  Result Value Ref Range Status   Specimen Description BLOOD RIGHT ASSIST CONTROL  Final   Special Requests   Final    BOTTLES DRAWN AEROBIC AND ANAEROBIC Blood  Culture adequate volume   Culture   Final    NO GROWTH 5 DAYS Performed at [facility], [address] [place], [place] [address]    Report Status 09/06/2020 FINAL  Final  CULTURE, BLOOD (ROUTINE X 2) w Reflex to ID Panel     Status: None   Collection Time: 09/01/20  9:50 PM   Specimen: BLOOD  Result Value Ref Range Status   Specimen Description BLOOD RIGHT HAND  Final   Special Requests   Final    BOTTLES DRAWN AEROBIC ONLY Blood Culture results may not be optimal due to an inadequate volume of blood received in culture bottles   Culture   Final    NO GROWTH 5 DAYS Performed at [facility], [address], [place], [place] [address]    Report Status 09/06/2020 FINAL  Final     Studies: DG Chest Port 1 View  Result Date: 09/05/2020 CLINICAL DATA:  Abnormal chest radiograph, shortness of breath, cough EXAM: PORTABLE CHEST 1 VIEW COMPARISON:  09/01/2020 FINDINGS: No significant interval change in AP portable examination with bandlike scarring and or atelectasis of the right midlung and  right lung base, small, layering left pleural effusion and elevation of the left hemidiaphragm, and mild cardiomegaly. Tracheostomy. IMPRESSION: No significant interval change in AP portable examination with bandlike scarring and or atelectasis of the right midlung and right lung base, small, layering left pleural effusion and elevation of the left hemidiaphragm, and mild cardiomegaly. No new airspace opacity. Electronically Signed   By: Eddie Candle M.D.   On: 09/05/2020 16:45    Scheduled Meds:  arformoterol  15 mcg Nebulization BID   aspirin EC  81 mg Oral Daily   atorvastatin  40 mg Oral QHS   bisoprolol  5 mg Oral Daily   budesonide (PULMICORT) nebulizer solution  0.5 mg Nebulization BID   Chlorhexidine Gluconate Cloth  6 each Topical Daily   furosemide  40 mg Intravenous Daily   insulin aspart  0-9 Units Subcutaneous TID WC   ipratropium-albuterol  3 mL Nebulization Q6H   melatonin  5 mg Oral QHS   theophylline  300 mg Oral Q12H     Assessment/Plan:  Acute on chronic hypoxic hypercapnic respiratory failure.  History of sarcoidosis her stay in the hospital bronchoscopy today showed some mucous plugging.  Culture sent off.  Patient was on the ventilator when I saw him this morning before bronchoscopy.  Pulmonology to try his BiPAP machine through his trach and see if that helps out with improvement of his symptoms.  Nebulizer treatments. Chronic systolic congestive heart failure on Lasix, bisoprolol aspirin and statin. Paroxysmal atrial fibrillation Eliquis was on hold for bronchoscopy.  Likely can go back on tomorrow. Acute kidney injury on chronic kidney disease stage IIIa.  On 09/01/2020 creatinine was 1.26.  Current creatinine 1.59. Type 2 diabetes mellitus with hyperlipidemia on atorvastatin.  Continue sliding scale insulin. Obesity with a BMI of 35.43 and sleep apnea.  On BiPAP at night        Code Status:     Code Status Orders  (From admission, onward)            Start     Ordered   09/01/20 2001  Full code  Continuous        09/01/20 2002           Code Status History     Date Active Date Inactive Code Status Order ID Comments User Context  07/23/2020 2053 07/26/2020 1904 Full Code [id number]  Rust-Chester, Huel Cote, NP ED   01/26/2020 2143 02/16/2020 1855 Full Code [id number]  Maudry Diego Inpatient   01/01/2020 1729 01/26/2020 1725 Full Code [id number]  Reubin Milan, MD ED   07/31/2019 1601 08/02/2019 1654 Full Code [id number]  Collier Bullock, MD ED   06/10/2019 1517 06/14/2019 1930 Full Code [id number]  Wyvonnia Dusky, MD ED   11/17/2018 1013 11/21/2018 1826 Full Code [id number]  Epifanio Lesches, MD ED   11/06/2018 0957 11/08/2018 1555 Full Code [id number]  Harrie Foreman, MD ED   07/29/2018 0034 07/30/2018 1938 Full Code [id number]  Mayer Camel, NP ED   02/03/2018 2201 02/06/2018 1552 Full Code [id number]  Dustin Flock, MD Inpatient   07/15/2016 0632 07/16/2016 1741 Full Code [id number]  Saundra Shelling, MD Inpatient   07/01/2016 1701 07/03/2016 1853 Full Code [id number]  Henreitta Leber, MD Inpatient   03/26/2016 0043 03/28/2016 2001 Full Code [id number]  Hugelmeyer, [place], DO Inpatient   11/17/2015 1131 11/18/2015 1845 Full Code [id number]  Bettey Costa, MD Inpatient   02/11/2015 1417 02/21/2015 1841 Full Code [id number]  Henreitta Leber, MD Inpatient   12/28/2014 2134 12/30/2014 1602 Full Code [id number]  Fritzi Mandes, MD Inpatient   08/24/2014 0245 08/26/2014 1819 Full Code [id number]  Lytle Butte, MD ED   08/14/2014 1031 08/18/2014 1830 Full Code [id number]  Vaughan Basta, MD Inpatient      Family Communication: Spoke with wife on the phone Disposition Plan: Status is: Inpatient  Dispo: The patient is from: home              Anticipated d/c is to: Home              Patient currently need to have a good plan in place prior to going home.  Family has brought in his BiPAP machine and needs to see if it is working properly to  avoid CO2 retention.   Difficult to place patient.  No.  Consultants: Pulmonary  Procedures: Bronchoscopy  Time spent: 27 minutes  [place]

## 2020-09-07 NOTE — Progress Notes (Signed)
RT assisted with bedside bronchoscopy using the disposable ambu bronchoscope. Time scope in was 1231 and scope out was 1236. Time out performed by Dr. Lanney Gins prior to procedure and consent was obtained as well. Patient placed on ventilator for procedure and left on at this time until more alert from sedatives given for bronch procedure. Patient tolerated procedure well with no complications.

## 2020-09-08 DIAGNOSIS — J9621 Acute and chronic respiratory failure with hypoxia: Secondary | ICD-10-CM | POA: Diagnosis not present

## 2020-09-08 DIAGNOSIS — N179 Acute kidney failure, unspecified: Secondary | ICD-10-CM | POA: Diagnosis not present

## 2020-09-08 DIAGNOSIS — I5022 Chronic systolic (congestive) heart failure: Secondary | ICD-10-CM | POA: Diagnosis not present

## 2020-09-08 DIAGNOSIS — I480 Paroxysmal atrial fibrillation: Secondary | ICD-10-CM | POA: Diagnosis not present

## 2020-09-08 LAB — BLOOD GAS, ARTERIAL
Acid-Base Excess: 10.7 mmol/L — ABNORMAL HIGH (ref 0.0–2.0)
Bicarbonate: 39 mmol/L — ABNORMAL HIGH (ref 20.0–28.0)
FIO2: 0.28
O2 Saturation: 98.2 %
Patient temperature: 37
pCO2 arterial: 69 mmHg (ref 32.0–48.0)
pH, Arterial: 7.36 (ref 7.350–7.450)
pO2, Arterial: 111 mmHg — ABNORMAL HIGH (ref 83.0–108.0)

## 2020-09-08 LAB — GLUCOSE, CAPILLARY
Glucose-Capillary: 125 mg/dL — ABNORMAL HIGH (ref 70–99)
Glucose-Capillary: 165 mg/dL — ABNORMAL HIGH (ref 70–99)
Glucose-Capillary: 140 mg/dL — ABNORMAL HIGH (ref 70–99)
Glucose-Capillary: 106 mg/dL — ABNORMAL HIGH (ref 70–99)

## 2020-09-08 LAB — BASIC METABOLIC PANEL
Anion gap: 7 (ref 5–15)
BUN: 25 mg/dL — ABNORMAL HIGH (ref 6–20)
CO2: 35 mmol/L — ABNORMAL HIGH (ref 22–32)
Calcium: 8.7 mg/dL — ABNORMAL LOW (ref 8.9–10.3)
Chloride: 102 mmol/L (ref 98–111)
Creatinine, Ser: 1.58 mg/dL — ABNORMAL HIGH (ref 0.61–1.24)
GFR, Estimated: 53 mL/min — ABNORMAL LOW (ref >60)
Glucose, Bld: 121 mg/dL — ABNORMAL HIGH (ref 70–99)
Potassium: 4.5 mmol/L (ref 3.5–5.1)
Sodium: 144 mmol/L (ref 135–145)

## 2020-09-08 LAB — MAGNESIUM: Magnesium: 2 mg/dL (ref 1.7–2.4)

## 2020-09-08 LAB — PHOSPHORUS: Phosphorus: 3.6 mg/dL (ref 2.5–4.6)

## 2020-09-08 MED ORDER — APIXABAN 5 MG PO TABS
5.0000 mg | ORAL_TABLET | Freq: Two times a day (BID) | ORAL | Status: DC
  Administered 2020-09-08 – 2020-09-14 (×12): 5 mg via ORAL
  Filled 2020-09-08 (×12): qty 1

## 2020-09-08 NOTE — Progress Notes (Signed)
NAME:  MACALLAN PEREGRINO, MRN:  [id number], DOB:  [birth year], LOS: 7 ADMISSION DATE:  09/01/2020, CONSULTATION DATE:  09/01/2020 REFERRING MD:  Blake Divine MD. 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  Patient is currently on the vent therefore history is limited. Per EMS run sheet, no reports of associated symptoms of chest pain, fevers or chills, worsening productive cough from baseline.  ED Course: On arrival to the ED, he was afebrile with blood pressure 173/100 mm Hg and pulse rate 103 beats/min, RR 30 breaths per minute. Due to persistent hypoxia, patient was placed on the vent. He received solumedrol and Duonebs treatments as well. PCCM consulted to admit to ICU for further management.   Significant Hospital Events   7/28: admit to ICU with acute on chronic hypercapnic respiratory failure requiring ventilator support in the setting of a chronic tracheostomy. 7/29: Tolerated pressure support trial, placed on trach collar 09/05/20- patient is improved.  He still has cough.  Lung auscultation is imporved.  CXR today reviewed.  NIV care reviewed with patient.  09/06/20- no overnight events. Discussed bronchoscopy today.  Spke with wife over phone and reviewed with his outpaitent pulmonologist 09/07/20- patient improved , he had bronchoscopy today with removal of mucus plugging and plan for home device ResMED iVAPS trial tonight in MICU 09/08/20- patient is improved we reviewed trache and RESmed device he used overnight, his ABG is improved but still with borderline hypercapnia.  He will attept to use trache overnight instead and will be education on self suctioning prior to dc home.   Consults:  PCCM   Significant  Diagnostic Tests:  7/28: Chest Xray>Increased hazy opacities of the LEFT lung base. Differential considerations include infection, aspiration or atelectasis  Micro Data:  7/28: SARS-CoV-2 PCR> negative 7/28: Influenza PCR> negative 7/28: Blood culture x2> 7/28: MRSA PCR> 7/28: Tracheal aspirate>  Antimicrobials:  NONE  OBJECTIVE  Blood pressure 102/61, pulse 72, temperature 98 F (36.7 C), temperature source Axillary, resp. rate (!) 35, height '6\' 1"'$  (1.854 m), weight 122.7 kg, SpO2 98 %.    Vent Mode: PRVC FiO2 (%):  [28 %-100 %] 28 % Set Rate:  [20 bmp] 20 bmp Vt Set:  [500 mL] 500 mL PEEP:  [5 cmH20] 5 cmH20   Intake/Output Summary (Last 24 hours) at 09/08/2020 0816 Last data filed at 09/08/2020 0400 Gross per 24 hour  Intake 900 ml  Output 1480 ml  Net -580 ml    Filed Weights   09/02/20 0221 09/07/20 0500 09/08/20 0419  Weight: 121.9 kg 121.8 kg 122.7 kg    Physical Examination  GENERAL: 50-year-old obese African-American male, lying on the bed, on the vent via trach EYES: Pupils equal, round, reactive to light and accommodation. No scleral icterus. Extraocular muscles intact.  HEENT: Head atraumatic, normocephalic. Oropharynx and nasopharynx clear.  NECK:  Supple, no jugular venous distention. No thyroid enlargement, no tenderness. Trach incision intact LUNGS: rhonchi and crepitations worse at RLL CARDIOVASCULAR: S1, S2 normal. No murmurs, rubs, or gallops.  ABDOMEN: Soft, nontender, nondistended. Bowel sounds present. No organomegaly or mass.  EXTREMITIES: No pedal edema, cyanosis, or clubbing.  NEUROLOGIC: Cranial nerves II through XII are intact.  Muscle strength 5/5 in all extremities. Sensation intact. SKIN:  No obvious rash, lesion, or ulcer.   Assessment & Plan:   Acute on chronic hypoxic/hypercapnic Respiratory Failure possibly due to mucous plug s/p trach PMHx: Severe COPD and Sarcoidosis, OSA, former smoker Patient is back to baseline He tolerated  pressure support trial Currently on trach collar He had mucous plugging this morning, had aggressive suctioning Considering history of obstructive sleep apnea, I think patient will require nocturnal vent Acute COPD exacerbation was ruled out Continue chest PT Continue home meds for COPD  Chronic systolic congestive Heart Failure NYHA class II (Last known EF 45-50%) Patient looks euvolemic Hypertension CAD s/p cardiac cath, HLD  Monitor intake and output Continue Lasix 40 mg IV At home he takes 80 mg p.o. Holding lisinopril considering AKI Continue metoprolol, aspirin and atorvastatin  Paroxysmal Atrial Fibrillation Currently in sinus rhythm CHA2DS2-VASc score: 3 Continue Eliquis for stroke prophylaxis Continue Metoprolol for rate control once able to take po  Acute on CKD Stage III Hypophosphatemia Monitor I&O's / urinary output Closely monitor serum creatinine and electrolytes Avoid nephrotoxic agents as able Replace electrolytes as indicated   Diabetes mellitus Type ll -CBGs -Sliding scale insulin -Follow ICU hyper/hypoglycemia protocol  Best practice:  Diet:  NPO, speech and swallow evaluation Pain/Anxiety/Delirium protocol (if indicated): N/A VAP protocol (if indicated): Yes DVT prophylaxis: Systemic AC GI prophylaxis: H2B Glucose control:  SSI Yes Central venous access:  N/A Arterial line:  N/A Foley:  N/A Mobility: As tolerated PT consulted: Yes Last date of multidisciplinary goals of care discussion [7/28] Code Status:  full code Disposition: ICU   = Goals of Care = Code Status Order: FULL  Primary Emergency Contact: Plantz,[name] Wishes to pursue full aggressive treatment and intervention options, including CPR and intubation, goals of care will be addressed on going with family if that should become necessary.   Total critical care time: 35 minutes    Critical care time was exclusive of separately billable procedures and treating other  patients.   Critical care was necessary to treat or prevent imminent or life-threatening deterioration.   Critical care was time spent personally by me on the following activities: development of treatment plan with patient and/or surrogate as well as nursing, discussions with consultants, evaluation of patient's response to treatment, examination of patient, obtaining history from patient or surrogate, ordering and performing treatments and interventions, ordering and review of laboratory studies, ordering and review of radiographic studies, pulse oximetry and re-evaluation of patient's condition.    Ottie Glazier, M.D.  Pulmonary & [place]    .

## 2020-09-08 NOTE — Plan of Care (Signed)

## 2020-09-08 NOTE — TOC Progression Note (Signed)
Transition of Care [facility]) - Progression Note    Patient Details  Name: [name] MRN: [id number] Date of Birth: [birth year]  Transition [facility]) CM/SW [place], RN Phone Number: 09/08/2020, 5:19 PM  Clinical Narrative:  Unable to talk with patient PT was working with him. Per PT note patient says he did not require help at home. Called and spoke with wife, who confirms that patient is independent at home with ADL's, drives and able to go and do whatever he needs to do. Confirmed with [employer] trach supplies are being delivered to home, last time end on July. Will not proceed with [facility] services at discharge.          Expected Discharge Plan and Services                                                 Social Determinants of Health (SDOH) Interventions    Readmission Risk Interventions Readmission Risk Prevention Plan 11/19/2018 07/30/2018 07/29/2018  Transportation Screening Complete Complete Complete  PCP or Specialist Appt within 3-5 Days - Complete Complete  HRI or [place] - Complete Complete  Social Work Consult for [place] Planning/Counseling - - Patient refused  Palliative Care Screening - Not Applicable Not Applicable  Medication Review Press photographer) Referral to Pharmacy Complete Complete  PCP or Specialist appointment within 3-5 days of discharge Complete - -  [place] or Home Care Consult Patient refused - -  SW Recovery Care/Counseling Consult Complete - -  Palliative Care Screening Not Applicable - -  [place] Not Applicable - -  Some recent data might be hidden

## 2020-09-08 NOTE — Progress Notes (Signed)
Patient has very diminished lung sounds, trach sounds partially obstructed. RT at the bedside to perform tracheal suction teaching. Lung sounds improved. Will continue to monitor.

## 2020-09-08 NOTE — Progress Notes (Signed)
Pt remains on home NIV unit and is tolerating well. Order for ABG 0800 to assess pt CO2.

## 2020-09-08 NOTE — Progress Notes (Signed)
Pt placed on home NIV unit. Pt has 2L O2 bleed in.

## 2020-09-08 NOTE — Progress Notes (Signed)
Patient ID: [name], male   DOB: 1970-08-27, 50 y.o.   MRN: [id number] [facility] PROGRESS NOTE  [name] DOB: February 28, 1970 DOA: 09/01/2020 PCP: [place]  HPI/Subjective: Patient feeling better today after bronchoscopy yesterday.  Feels like he is able to take a deep breath.  Wants to try BiPAP hooked to the trach tonight.  Blood gas with facemask this morning showed CO2 a little bit higher than the previous ABG.  Objective: Vitals:   09/08/20 0635 09/08/20 1134  BP: 102/61   Pulse: 72   Resp:    Temp:    SpO2: 98% 100%    Intake/Output Summary (Last 24 hours) at 09/08/2020 1323 Last data filed at 09/08/2020 0400 Gross per 24 hour  Intake 900 ml  Output 1200 ml  Net -300 ml    Filed Weights   09/02/20 0221 09/07/20 0500 09/08/20 0419  Weight: 121.9 kg 121.8 kg 122.7 kg    ROS: Review of Systems  Respiratory:  Negative for shortness of breath.   Cardiovascular:  Negative for chest pain.  Gastrointestinal:  Negative for abdominal pain, nausea and vomiting.  Exam: Physical Exam HENT:     Head: Normocephalic.     Mouth/Throat:     Pharynx: No oropharyngeal exudate.  Eyes:     General: Lids are normal.     Pupils: Pupils are equal, round, and reactive to light.  Cardiovascular:     Rate and Rhythm: Normal rate and regular rhythm.     Heart sounds: Normal heart sounds, S1 normal and S2 normal.  Pulmonary:     Breath sounds: No decreased breath sounds, wheezing, rhonchi or rales.  Abdominal:     Palpations: Abdomen is soft.     Tenderness: There is no abdominal tenderness.  Musculoskeletal:     Right lower leg: Swelling present.     Left lower leg: Swelling present.  Skin:    General: Skin is warm.     Findings: No rash.  Neurological:     Mental Status: He is alert and oriented to person, place, and time.     Data Reviewed: Basic Metabolic Panel: Recent Labs  Lab 09/04/20 0506 09/05/20 0459 09/06/20 0350  09/07/20 0445 09/08/20 0518  NA 142 140 139 140 144  K 4.4 4.2 4.0 3.9 4.5  CL 101 98 97* 100 102  CO2 37* 36* 37* 34* 35*  GLUCOSE 169* 141* 146* 138* 121*  BUN 26* 24* 24* 24* 25*  CREATININE 1.50* 1.49* 1.48* 1.59* 1.58*  CALCIUM 8.5* 8.8* 8.7* 8.7* 8.7*  MG 2.2 2.3 2.2 2.1 2.0  PHOS 3.1 3.5 2.7 2.9 3.6     CBC: Recent Labs  Lab 09/03/20 0520 09/04/20 0506 09/05/20 0459 09/06/20 0350 09/07/20 0445  WBC 8.7 6.0 6.1 6.7 5.3  NEUTROABS 5.7  --   --   --   --   HGB 13.6 13.6 14.0 13.3 13.0  HCT 43.3 42.9 44.3 43.0 40.8  MCV 89.6 90.5 89.5 87.9 88.9  PLT 204 187 199 204 192     BNP (last 3 results) Recent Labs    01/01/20 2028 07/23/20 1734 09/01/20 1531  BNP 102.4* 218.9* 113.9*      CBG: Recent Labs  Lab 09/07/20 0737 09/07/20 1130 09/07/20 1641 09/07/20 2002 09/08/20 0741  GLUCAP 124* 122* 268* 117* 125*     Recent Results (from the past 240 hour(s))  Resp Panel by RT-PCR (Flu A&B, Covid) Nasopharyngeal Swab  Status: None   Collection Time: 09/01/20  7:14 PM   Specimen: Nasopharyngeal Swab; Nasopharyngeal(NP) swabs in vial transport medium  Result Value Ref Range Status   SARS Coronavirus 2 by RT PCR NEGATIVE NEGATIVE Final    Comment: (NOTE) SARS-CoV-2 target nucleic acids are NOT DETECTED.  The SARS-CoV-2 RNA is generally detectable in upper respiratory specimens during the acute phase of infection. The lowest concentration of SARS-CoV-2 viral copies this assay can detect is 138 copies/mL. A negative result does not preclude SARS-Cov-2 infection and should not be used as the sole basis for treatment or other patient management decisions. A negative result may occur with  improper specimen collection/handling, submission of specimen other than nasopharyngeal swab, presence of viral mutation(s) within the areas targeted by this assay, and inadequate number of viral copies(<138 copies/mL). A negative result must be combined with clinical  observations, patient history, and epidemiological information. The expected result is Negative.  Fact Sheet for Patients:  EntrepreneurPulse.com.au  Fact Sheet for Healthcare Providers:  IncredibleEmployment.be  This test is no t yet approved or cleared by the [place] FDA and  has been authorized for detection and/or diagnosis of SARS-CoV-2 by FDA under an Emergency Use Authorization (EUA). This EUA will remain  in effect (meaning this test can be used) for the duration of the COVID-19 declaration under Section 564(b)(1) of the Act, 21 U.S.C.section 360bbb-3(b)(1), unless the authorization is terminated  or revoked sooner.       Influenza A by PCR NEGATIVE NEGATIVE Final   Influenza B by PCR NEGATIVE NEGATIVE Final    Comment: (NOTE) The Xpert Xpress SARS-CoV-2/FLU/RSV plus assay is intended as an aid in the diagnosis of influenza from Nasopharyngeal swab specimens and should not be used as a sole basis for treatment. Nasal washings and aspirates are unacceptable for Xpert Xpress SARS-CoV-2/FLU/RSV testing.  Fact Sheet for Patients: EntrepreneurPulse.com.au  Fact Sheet for Healthcare Providers: IncredibleEmployment.be  This test is not yet approved or cleared by the [place] FDA and has been authorized for detection and/or diagnosis of SARS-CoV-2 by FDA under an Emergency Use Authorization (EUA). This EUA will remain in effect (meaning this test can be used) for the duration of the COVID-19 declaration under Section 564(b)(1) of the Act, 21 U.S.C. section 360bbb-3(b)(1), unless the authorization is terminated or revoked.  Performed at [facility], [address] [place], [place] [address]   MRSA Next Gen by PCR, Nasal     Status: None   Collection Time: 09/01/20  9:23 PM   Specimen: Nasal Mucosa; Nasal Swab  Result Value Ref Range Status   MRSA by PCR Next Gen NOT DETECTED  NOT DETECTED Final    Comment: (NOTE) The GeneXpert MRSA Assay (FDA approved for NASAL specimens only), is one component of a comprehensive MRSA colonization surveillance program. It is not intended to diagnose MRSA infection nor to guide or monitor treatment for MRSA infections. Test performance is not FDA approved in patients less than 73 years old. Performed at [facility], [address] [place], [place] [address]   CULTURE, BLOOD (ROUTINE X 2) w Reflex to ID Panel     Status: None   Collection Time: 09/01/20  9:43 PM   Specimen: BLOOD  Result Value Ref Range Status   Specimen Description BLOOD RIGHT ASSIST CONTROL  Final   Special Requests   Final    BOTTLES DRAWN AEROBIC AND ANAEROBIC Blood Culture adequate volume   Culture   Final    NO  GROWTH 5 DAYS Performed at [facility], [address] [place], [place] [address]    Report Status 09/06/2020 FINAL  Final  CULTURE, BLOOD (ROUTINE X 2) w Reflex to ID Panel     Status: None   Collection Time: 09/01/20  9:50 PM   Specimen: BLOOD  Result Value Ref Range Status   Specimen Description BLOOD RIGHT HAND  Final   Special Requests   Final    BOTTLES DRAWN AEROBIC ONLY Blood Culture results may not be optimal due to an inadequate volume of blood received in culture bottles   Culture   Final    NO GROWTH 5 DAYS Performed at [facility], [address], [place], [place] [address]    Report Status 09/06/2020 FINAL  Final  Culture, BAL-quantitative w Gram Stain     Status: None (Preliminary result)   Collection Time: 09/07/20 12:40 PM   Specimen: Bronchoalveolar Lavage; Respiratory  Result Value Ref Range Status   Specimen Description   Final    BRONCHIAL ALVEOLAR LAVAGE Performed at [facility], [address], [place], [place] [address]    Special Requests   Final    NONE Performed at [facility], [place], [place] [address]    Gram Stain   Final     RARE WBC PRESENT, PREDOMINANTLY PMN FEW GRAM POSITIVE RODS RARE GRAM POSITIVE COCCI Performed at [facility], [place] [address], [place], [place] [address]    Culture PENDING  Incomplete   Report Status PENDING  Incomplete      Scheduled Meds:  apixaban  5 mg Oral BID   arformoterol  15 mcg Nebulization BID   aspirin EC  81 mg Oral Daily   atorvastatin  40 mg Oral QHS   bisoprolol  5 mg Oral Daily   budesonide (PULMICORT) nebulizer solution  0.5 mg Nebulization BID   Chlorhexidine Gluconate Cloth  6 each Topical Daily   furosemide  40 mg Oral Daily   insulin aspart  0-9 Units Subcutaneous TID WC   ipratropium-albuterol  3 mL Nebulization Q6H   melatonin  5 mg Oral QHS   pantoprazole  40 mg Oral Daily   theophylline  300 mg Oral Q12H     Assessment/Plan:  Acute on chronic hypoxic hypercapnic respiratory failure.  Patient had a bronchoscopy yesterday that pulled out mucous plugging.  Patient breathing better today.  Patient would like to try the BiPAP hookup through the trach tonight.  If that works out well may be to be able to get home tomorrow. Chronic systolic heart failure on Lasix, bisoprolol, aspirin and statin Paroxysmal atrial fibrillation.  Restarted Eliquis Acute kidney injury on chronic kidney disease stage IIIa.  Today's creatinine 1.58.  On 09/01/2000 creatinine was 1.26.  Watch closely with oral Lasix. Type 2 diabetes mellitus with hyperlipidemia.  Continue atorvastatin.  On sliding scale insulin. Obesity with a BMI of 35.69 with sleep apnea.  Trial of BiPAP through the trach tonight.       Code Status:     Code Status Orders  (From admission, onward)           Start     Ordered   09/01/20 2001  Full code  Continuous        09/01/20 2002           Code Status History     Date Active Date Inactive Code Status Order ID Comments User Context   07/23/2020 2053  07/26/2020 1904 Full Code [id number]  Rust-Chester, Huel Cote, NP ED   01/26/2020 2143  02/16/2020 1855 Full Code [id number]  Maudry Diego Inpatient   01/01/2020 1729 01/26/2020 1725 Full Code [id number]  Reubin [place], MD ED   07/31/2019 1601 08/02/2019 1654 Full Code [id number]  Collier Bullock, MD ED   06/10/2019 1517 06/14/2019 1930 Full Code [id number]  Wyvonnia Dusky, MD ED   11/17/2018 1013 11/21/2018 1826 Full Code [id number]  Epifanio Lesches, MD ED   11/06/2018 0957 11/08/2018 1555 Full Code [id number]  Harrie Foreman, MD ED   07/29/2018 0034 07/30/2018 1938 Full Code [id number]  Mayer Camel, NP ED   02/03/2018 2201 02/06/2018 1552 Full Code [id number]  Dustin Flock, MD Inpatient   07/15/2016 0632 07/16/2016 1741 Full Code [id number]  Saundra Shelling, MD Inpatient   07/01/2016 1701 07/03/2016 1853 Full Code [id number]  Henreitta Leber, MD Inpatient   03/26/2016 0043 03/28/2016 2001 Full Code [id number]  Hugelmeyer, Alexis, DO Inpatient   11/17/2015 1131 11/18/2015 1845 Full Code [id number]  Bettey Costa, MD Inpatient   02/11/2015 1417 02/21/2015 1841 Full Code [id number]  Henreitta Leber, MD Inpatient   12/28/2014 2134 12/30/2014 1602 Full Code [id number]  Fritzi Mandes, MD Inpatient   08/24/2014 0245 08/26/2014 1819 Full Code [id number]  Lytle Butte, MD ED   08/14/2014 1031 08/18/2014 1830 Full Code [id number]  Vaughan Basta, MD Inpatient      Family Communication: Spoke with wife on the phone Disposition Plan: Status is: Inpatient  Dispo: The patient is from: home              Anticipated d/c is to: Home              Patient currently need to have a good plan in place prior to going home.  Family has brought in his BiPAP machine and needs to see if it is working properly to avoid CO2 retention.   Difficult to place patient.  No.  Consultants: Pulmonary  Procedures: Bronchoscopy  Time spent: 27 minutes  [place]

## 2020-09-08 NOTE — Progress Notes (Signed)
Physical Therapy Treatment Patient Details Name: [name] MRN: [id number] DOB: 1970-11-21 Today's Date: 09/08/2020    History of Present Illness [name] is a [name] comes to [facility] on 08/31/20 c SOB. Pt admitted with hypercapnic resp failuer 2/2 COPD exacerbation. PMH: CHF (combined), COPD, CAD, MI, DM2, HLD, HTN, sarcoidosis, OSA, CVA, CRF  c tracheostomy on chronic 2L at home, current tobacco and polysubstance abuse.    PT Comments    Pt seated EOB upon entry, agreeable to session. Author assisted with O2 change from wall to venturi mask 40% FiO2 8L as discussed with RT. Pt progresses AMB to >678f in ICU, no significant difficult, no signs of exersion, occasional tachy alarm on monitor appears to be artifact as it resolves when AMB is paused, no symptoms. Pt tolerating session well, still reports to be at baseline balance, strength, general mobility.  Pt still denies any need for services or DME at DC.   Follow Up Recommendations  No PT follow up     Equipment Recommendations  None recommended by PT    Recommendations for Other Services       Precautions / Restrictions Precautions Precautions: None Restrictions Weight Bearing Restrictions: No    Mobility  Bed Mobility Overal bed mobility: Independent                  Transfers Overall transfer level: Independent Equipment used: None Transfers: Sit to/from Stand              Ambulation/Gait Ambulation/Gait assistance: Modified independent (Device/Increase time);Supervision Gait Distance (Feet): 630 Feet Assistive device: None Gait Pattern/deviations: WFL(Within Functional Limits)     General Gait Details: author assists with moving O2 from wall to venturi mask/tank   Stairs             Wheelchair Mobility    Modified Rankin (Stroke Patients Only)       Balance Overall balance assessment: Independent                                          Cognition  Arousal/Alertness: Awake/alert Behavior During Therapy: WFL for tasks assessed/performed Overall Cognitive Status: Within Functional Limits for tasks assessed                                 General Comments: Pt tolerates PMSV during entire session without issue      Exercises      General Comments        Pertinent Vitals/Pain      Home Living                      Prior Function            PT Goals (current goals can now be found in the care plan section) Acute Rehab PT Goals Patient Stated Goal: to get stronger and go home PT Goal Formulation: With patient Time For Goal Achievement: 09/16/20 Potential to Achieve Goals: Good Progress towards PT goals: Progressing toward goals    Frequency    Min 2X/week      PT Plan Current plan remains appropriate    Co-evaluation              AM-PAC PT "6 Clicks" Mobility   Outcome Measure  Help needed turning from your back  to your side while in a flat bed without using bedrails?: None Help needed moving from lying on your back to sitting on the side of a flat bed without using bedrails?: None Help needed moving to and from a bed to a chair (including a wheelchair)?: None Help needed standing up from a chair using your arms (e.g., wheelchair or bedside chair)?: None Help needed to walk in hospital room?: A Little Help needed climbing 3-5 steps with a railing? : A Little 6 Click Score: 22    End of Session Equipment Utilized During Treatment: Oxygen Activity Tolerance: Patient tolerated treatment well Patient left: in bed;with call bell/phone within reach Nurse Communication: Mobility status PT Visit Diagnosis: Other abnormalities of gait and mobility (R26.89);Difficulty in walking, not elsewhere classified (R26.2)     Time: 1410-1435 PT Time Calculation (min) (ACUTE ONLY): 25 min  Charges:  $Therapeutic Exercise: 23-37 mins                    2:55 PM, 09/08/20 Etta Grandchild, PT,  DPT Physical Therapist - [facility]  [phone] ([place])     [name] 09/08/2020, 2:53 PM

## 2020-09-09 DIAGNOSIS — N179 Acute kidney failure, unspecified: Secondary | ICD-10-CM | POA: Diagnosis not present

## 2020-09-09 DIAGNOSIS — I480 Paroxysmal atrial fibrillation: Secondary | ICD-10-CM | POA: Diagnosis not present

## 2020-09-09 DIAGNOSIS — I5022 Chronic systolic (congestive) heart failure: Secondary | ICD-10-CM | POA: Diagnosis not present

## 2020-09-09 DIAGNOSIS — J9602 Acute respiratory failure with hypercapnia: Secondary | ICD-10-CM

## 2020-09-09 DIAGNOSIS — J9601 Acute respiratory failure with hypoxia: Secondary | ICD-10-CM

## 2020-09-09 LAB — GLUCOSE, CAPILLARY
Glucose-Capillary: 132 mg/dL — ABNORMAL HIGH (ref 70–99)
Glucose-Capillary: 160 mg/dL — ABNORMAL HIGH (ref 70–99)
Glucose-Capillary: 187 mg/dL — ABNORMAL HIGH (ref 70–99)
Glucose-Capillary: 127 mg/dL — ABNORMAL HIGH (ref 70–99)

## 2020-09-09 LAB — BASIC METABOLIC PANEL
Anion gap: 4 — ABNORMAL LOW (ref 5–15)
BUN: 23 mg/dL — ABNORMAL HIGH (ref 6–20)
CO2: 33 mmol/L — ABNORMAL HIGH (ref 22–32)
Calcium: 8.2 mg/dL — ABNORMAL LOW (ref 8.9–10.3)
Chloride: 102 mmol/L (ref 98–111)
Creatinine, Ser: 1.58 mg/dL — ABNORMAL HIGH (ref 0.61–1.24)
GFR, Estimated: 53 mL/min — ABNORMAL LOW (ref >60)
Glucose, Bld: 170 mg/dL — ABNORMAL HIGH (ref 70–99)
Potassium: 4.3 mmol/L (ref 3.5–5.1)
Sodium: 139 mmol/L (ref 135–145)

## 2020-09-09 LAB — MAGNESIUM: Magnesium: 2 mg/dL (ref 1.7–2.4)

## 2020-09-09 LAB — CULTURE, BAL-QUANTITATIVE W GRAM STAIN: Culture: NORMAL

## 2020-09-09 NOTE — Progress Notes (Signed)
NAME:  [name], MRN:  [id number], DOB:  1970/05/31, LOS: 8 ADMISSION DATE:  09/01/2020, CONSULTATION DATE:  09/01/2020 REFERRING MD:  Blake Divine MD. 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  Patient is currently on the vent therefore history is limited. Per EMS run sheet, no reports of associated symptoms of chest pain, fevers or chills, worsening productive cough from baseline.  ED Course: On arrival to the ED, he was afebrile with blood pressure 173/100 mm Hg and pulse rate 103 beats/min, RR 30 breaths per minute. Due to persistent hypoxia, patient was placed on the vent. He received solumedrol and Duonebs treatments as well. PCCM consulted to admit to ICU for further management.   Significant Hospital Events   7/28: admit to ICU with acute on chronic hypercapnic respiratory failure requiring ventilator support in the setting of a chronic tracheostomy. 7/29: Tolerated pressure support trial, placed on trach collar 09/05/20- patient is improved.  He still has cough.  Lung auscultation is imporved.  CXR today reviewed.  NIV care reviewed with patient.  09/06/20- no overnight events. Discussed bronchoscopy today.  Spke with wife over phone and reviewed with his outpaitent pulmonologist 09/07/20- patient improved , he had bronchoscopy today with removal of mucus plugging and plan for home device ResMED iVAPS trial tonight in MICU 09/08/20- patient is improved we reviewed trache and RESmed device he used overnight, his ABG is improved but still with borderline hypercapnia.  He will attept to use trache overnight instead and will be education on self suctioning prior to dc home.  09/09/20- patient is having [place]  his machine and get new device for invasive ventilation.  Discussed with Dr Leslye Peer will need to trial one more day.   Consults:  PCCM   Significant Diagnostic Tests:  7/28: Chest Xray>Increased hazy opacities of the LEFT lung base. Differential considerations include infection, aspiration or atelectasis  Micro Data:  7/28: SARS-CoV-2 PCR> negative 7/28: Influenza PCR> negative 7/28: Blood culture x2> 7/28: MRSA PCR> 7/28: Tracheal aspirate>  Antimicrobials:  NONE  OBJECTIVE  Blood pressure 121/79, pulse 70, temperature 98.4 F (36.9 C), temperature source Oral, resp. rate (!) 22, height '6\' 1"'$  (1.854 m), weight 122.6 kg, SpO2 96 %.    FiO2 (%):  [28 %] 28 %   Intake/Output Summary (Last 24 hours) at 09/09/2020 0858 Last data filed at 09/09/2020 0800 Gross per 24 hour  Intake 1760 ml  Output 1820 ml  Net -60 ml    Filed Weights   09/07/20 0500 09/08/20 0419 09/09/20 0353  Weight: 121.8 kg 122.7 kg 122.6 kg    Physical Examination  GENERAL: Middle-age obese African-American male, lying on the bed, on the vent via trach EYES: Pupils equal, round, reactive to light and accommodation. No scleral icterus. Extraocular muscles intact.  HEENT: Head atraumatic, normocephalic. Oropharynx and nasopharynx clear.  NECK:  Supple, no jugular venous distention. No thyroid enlargement, no tenderness. Trach incision intact LUNGS: mildly rhonchorous CARDIOVASCULAR: S1, S2 normal. No murmurs, rubs, or gallops.  ABDOMEN: Soft, nontender, nondistended. Bowel sounds present. No organomegaly or mass.  EXTREMITIES: No pedal edema, cyanosis, or clubbing.  NEUROLOGIC: Cranial nerves II through XII are intact.  Muscle strength 5/5 in all extremities. Sensation intact. SKIN:  No obvious rash, lesion, or ulcer.   Assessment & Plan:   Acute on chronic hypoxic/hypercapnic Respiratory Failure possibly due to mucous plugging and advanced COPD           -s/p bronchoscopy - evacuated thickened inspissated  mucus plugging           - he has some issues using his trache and unable to ventilate with mask due to interface leakage.  -He had mucous plugging this morning, had aggressive suctioning- Continue chest PT Continue home meds for COPD  Chronic systolic congestive Heart Failure NYHA class II (Last known EF 45-50%) Patient looks euvolemic Hypertension CAD s/p cardiac cath, HLD  Monitor intake and output Continue Lasix 40 mg IV At home he takes 80 mg p.o. Holding lisinopril considering AKI Continue metoprolol, aspirin and atorvastatin  Paroxysmal Atrial Fibrillation Currently in sinus rhythm CHA2DS2-VASc score: 3 Continue Eliquis for stroke prophylaxis Continue Metoprolol for rate control once able to take po  Acute on CKD Stage III Hypophosphatemia Monitor I&O's / urinary output Closely monitor serum creatinine and electrolytes Avoid nephrotoxic agents as able Replace electrolytes as indicated   Diabetes mellitus Type ll -CBGs -Sliding scale insulin -Follow ICU hyper/hypoglycemia protocol  Best practice:  Diet:  NPO, speech and swallow evaluation Pain/Anxiety/Delirium protocol (if indicated): N/A VAP protocol (if indicated): Yes DVT prophylaxis: Systemic AC GI prophylaxis: H2B Glucose control:  SSI Yes Central venous access:  N/A Arterial line:  N/A Foley:  N/A Mobility: As tolerated PT consulted: Yes Last date of multidisciplinary goals of care discussion [7/28] Code Status:  full code Disposition: ICU   = Goals of Care = Code Status Order: FULL  Primary Emergency Contact: Reish,[name] Wishes to pursue full aggressive treatment and intervention options, including CPR and intubation, goals of care will be addressed on going with family if that should become necessary.   Total critical care time: 35 minutes    Critical care time was exclusive of separately billable procedures and treating other patients.   Critical care was necessary to treat or prevent  imminent or life-threatening deterioration.   Critical care was time spent personally by me on the following activities: development of treatment plan with patient and/or surrogate as well as nursing, discussions with consultants, evaluation of patient's response to treatment, examination of patient, obtaining history from patient or surrogate, ordering and performing treatments and interventions, ordering and review of laboratory studies, ordering and review of radiographic studies, pulse oximetry and re-evaluation of patient's condition.    Ottie Glazier, M.D.  [facility] [place]    .

## 2020-09-09 NOTE — TOC Progression Note (Signed)
Transition of Care [facility]) - Progression Note    Patient Details  Name: [name] MRN: [id number] Date of Birth: May 08, 1970  Transition of Care [facility]) CM/SW [place], RN Phone Number: 09/09/2020, 2:02 PM  Clinical Narrative:  Deanna Artis did come to assess patient and says that the machine he is currently using from [place] is Non-Invasive ventilator and is not compatible to the Trach collar which requires Invasive, therefore she changed the settings from 500 to 530. To get an invasive device a new order is required, should read humidified invasive ventilator with setting ranges and they will need to confirm Insurance qualification for the invasive vent. Patient feels unsafe going home at this time and prefers to use the [place] machine over night to make sure he's ok. Attending notified.           Expected Discharge Plan and Services                                                 Social Determinants of Health (SDOH) Interventions    Readmission Risk Interventions Readmission Risk Prevention Plan 11/19/2018 07/30/2018 07/29/2018  Transportation Screening Complete Complete Complete  PCP or Specialist Appt within 3-5 Days - Complete Complete  HRI or [place] - Complete Complete  Social Work Consult for [place] Planning/Counseling - - Patient refused  Palliative Care Screening - Not Applicable Not Applicable  Medication Review Press photographer) Referral to Pharmacy Complete Complete  PCP or Specialist appointment within 3-5 days of discharge Complete - -  [place] or Home Care Consult Patient refused - -  SW Recovery Care/Counseling Consult Complete - -  Palliative Care Screening Not Applicable - -  [place] Not Applicable - -  Some recent data might be hidden

## 2020-09-09 NOTE — Progress Notes (Signed)
Pt care assumed. Pt w/ chronic trach inquiring about placing home unit on via trach interface. Explained to pt that interfaces currently available would not allow to safely do so. Pt understood, trach capped and pt placed on home unit non-invasive via face mask. Pt seem to tol well. Will monitor.

## 2020-09-09 NOTE — Progress Notes (Signed)
Occupational Therapy Treatment Patient Details Name: [name] MRN: [id number] DOB: 24-Aug-1970 Today's Date: 09/09/2020    History of present illness [name] is a [name] comes [facility] on 08/31/20 c SOB. Pt admitted with hypercapnic resp failuer 2/2 COPD exacerbation. PMH: CHF (combined), COPD, CAD, MI, DM2, HLD, HTN, sarcoidosis, OSA, CVA, CRF  c tracheostomy on chronic 2L at home, current tobacco and polysubstance abuse.   OT comments  Pt seen for OT Tx this date to f/u re: safety with ADLs/ADL mobility. Pt sitting EOB when OT presents. Pt reporting fatigue and not agreeable to fxl mobility at this time, but agreeable to education and exercise in sitting. OT engages pt in ed re: core/trunk exercise as it pertains to lung function and diaprhagm strength to clear secretions. Pt with good understanding, OT demos contralateral reaching for transverse abdominus and lateral bends for oblique strengthening. Pt able to offer return demonstration showing good understanding. Pt left seated EOB setup for dinner. All needs met and in reach. Will continue to follow.   Follow Up Recommendations  Home health OT    Equipment Recommendations  None recommended by OT;Other (comment) (has all necessary equipment)    Recommendations for Other Services      Precautions / Restrictions Precautions Precautions: None Restrictions Weight Bearing Restrictions: No       Mobility Bed Mobility Overal bed mobility: Independent                  Transfers                      Balance Overall balance assessment: Independent Sitting-balance support: Feet supported Sitting balance-Leahy Scale: Good Sitting balance - Comments: G static and dynamic sitting                                   ADL either performed or assessed with clinical judgement   ADL                                               Vision Patient Visual Report: No change from  baseline     Perception     Praxis      Cognition Arousal/Alertness: Awake/alert Behavior During Therapy: WFL for tasks assessed/performed Overall Cognitive Status: Within Functional Limits for tasks assessed                                 General Comments: Pt tolerates PMSV during entire session without issue        Exercises Other Exercises Other Exercises: OT engages pt in ed re: core/trunk exercise as it pertains to lung function and diaprhagm strength to clear secretions. Pt with good understanding, OT demos contralateral reaching for transverse abdominus and lateral bends for oblique strengthening. Pt able to offer return demonstration showing good understanding.   Shoulder Instructions       General Comments      Pertinent Vitals/ Pain       Pain Assessment: No/denies pain  Home Living  Prior Functioning/Environment              Frequency  Min 2X/week        Progress Toward Goals  OT Goals(current goals can now be found in the care plan section)  Progress towards OT goals: Progressing toward goals  Acute Rehab OT Goals Patient Stated Goal: to get stronger and go home OT Goal Formulation: With patient Time For Goal Achievement: 09/16/20 Potential to Achieve Goals: Good  Plan Discharge plan remains appropriate;Frequency remains appropriate    Co-evaluation                 AM-PAC OT "6 Clicks" Daily Activity     Outcome Measure   Help from another person eating meals?: None Help from another person taking care of personal grooming?: None Help from another person toileting, which includes using toliet, bedpan, or urinal?: A Little Help from another person bathing (including washing, rinsing, drying)?: A Little Help from another person to put on and taking off regular upper body clothing?: None Help from another person to put on and taking off regular lower body  clothing?: A Little 6 Click Score: 21    End of Session Equipment Utilized During Treatment: Other (comment) (trach)  OT Visit Diagnosis: Unsteadiness on feet (R26.81);Muscle weakness (generalized) (M62.81)   Activity Tolerance Patient tolerated treatment well   Patient Left Other (comment);with call bell/phone within reach (sitting EOB setup to eat from dinner tray)   Nurse Communication          Time: 1602-1615 OT Time Calculation (min): 13 min  Charges: OT General Charges $OT Visit: 1 Visit OT Treatments $Therapeutic Exercise: 8-22 mins  Alison Sell, MS, OTR/L ascom [phone] 09/09/20, 4:22 PM   

## 2020-09-09 NOTE — Progress Notes (Signed)
Shift Summary: Patient unable to wear home vent connected to trach due to lack of proper equipment circuit. Patient capped trach and wore NIV vent mask as he does at home and did the previous night. RT completed tracheal suction teaching with patient. Patient had a 20 beat run of Vtach, asymptomatic. Routine labs ordered and obtained. Patient for possible discharge today.

## 2020-09-09 NOTE — Progress Notes (Addendum)
Patient ID: [name], male   DOB: March 03, 1970, 50 y.o.   MRN: [id number] [facility] PROGRESS NOTE  [name]:[id number] DOB: 28-Oct-1970 DOA: 09/01/2020 PCP: [place]  HPI/Subjective: Patient feeling okay.  Wants to try the noninvasive ventilation through his trach here tonight.  Had to get the oxygen company to come and to set it up.  They will also have to get approval through the insurance company.  Feels okay with regards to his breathing.  Repeated hospitalizations for similar events.  Admitted with acute on chronic hypercapnic respiratory failure.  Objective: Vitals:   09/09/20 1309 09/09/20 1341  BP: 119/71   Pulse: 74   Resp:    Temp: 98 F (36.7 C)   SpO2: 91% 95%    Intake/Output Summary (Last 24 hours) at 09/09/2020 1513 Last data filed at 09/09/2020 1309 Gross per 24 hour  Intake 2000 ml  Output 1800 ml  Net 200 ml   Filed Weights   09/07/20 0500 09/08/20 0419 09/09/20 0353  Weight: 121.8 kg 122.7 kg 122.6 kg    ROS: Review of Systems  Respiratory:  Positive for cough and shortness of breath.   Cardiovascular:  Negative for chest pain.  Gastrointestinal:  Negative for abdominal pain, nausea and vomiting.  Exam: Physical Exam HENT:     Head: Normocephalic.     Mouth/Throat:     Pharynx: No oropharyngeal exudate.  Eyes:     General: Lids are normal.     Conjunctiva/sclera: Conjunctivae normal.     Pupils: Pupils are equal, round, and reactive to light.  Cardiovascular:     Rate and Rhythm: Normal rate and regular rhythm.     Heart sounds: Normal heart sounds, S1 normal and S2 normal.  Pulmonary:     Breath sounds: Examination of the right-lower field reveals decreased breath sounds. Examination of the left-lower field reveals decreased breath sounds. Decreased breath sounds present. No wheezing, rhonchi or rales.  Abdominal:     Palpations: Abdomen is soft.     Tenderness: There is no abdominal tenderness.   Musculoskeletal:     Right lower leg: No swelling.     Left lower leg: No swelling.  Skin:    General: Skin is warm.     Findings: No rash.  Neurological:     Mental Status: He is alert and oriented to person, place, and time.     Data Reviewed: Basic Metabolic Panel: Recent Labs  Lab 09/04/20 0506 09/05/20 0459 09/06/20 0350 09/07/20 0445 09/08/20 0518 09/09/20 0606  NA 142 140 139 140 144 139  K 4.4 4.2 4.0 3.9 4.5 4.3  CL 101 98 97* 100 102 102  CO2 37* 36* 37* 34* 35* 33*  GLUCOSE 169* 141* 146* 138* 121* 170*  BUN 26* 24* 24* 24* 25* 23*  CREATININE 1.50* 1.49* 1.48* 1.59* 1.58* 1.58*  CALCIUM 8.5* 8.8* 8.7* 8.7* 8.7* 8.2*  MG 2.2 2.3 2.2 2.1 2.0 2.0  PHOS 3.1 3.5 2.7 2.9 3.6  --    CBC: Recent Labs  Lab 09/03/20 0520 09/04/20 0506 09/05/20 0459 09/06/20 0350 09/07/20 0445  WBC 8.7 6.0 6.1 6.7 5.3  NEUTROABS 5.7  --   --   --   --   HGB 13.6 13.6 14.0 13.3 13.0  HCT 43.3 42.9 44.3 43.0 40.8  MCV 89.6 90.5 89.5 87.9 88.9  PLT 204 187 199 204 192    BNP (last 3 results) Recent Labs    01/01/20 2028  07/23/20 1734 09/01/20 1531  BNP 102.4* 218.9* 113.9*    CBG: Recent Labs  Lab 09/08/20 1207 09/08/20 1640 09/08/20 2147 09/09/20 0730 09/09/20 1129  GLUCAP 165* 140* 106* 132* 160*    Recent Results (from the past 240 hour(s))  Resp Panel by RT-PCR (Flu A&B, Covid) Nasopharyngeal Swab     Status: None   Collection Time: 09/01/20  7:14 PM   Specimen: Nasopharyngeal Swab; Nasopharyngeal(NP) swabs in vial transport medium  Result Value Ref Range Status   SARS Coronavirus 2 by RT PCR NEGATIVE NEGATIVE Final    Comment: (NOTE) SARS-CoV-2 target nucleic acids are NOT DETECTED.  The SARS-CoV-2 RNA is generally detectable in upper respiratory specimens during the acute phase of infection. The lowest concentration of SARS-CoV-2 viral copies this assay can detect is 138 copies/mL. A negative result does not preclude SARS-Cov-2 infection and  should not be used as the sole basis for treatment or other patient management decisions. A negative result may occur with  improper specimen collection/handling, submission of specimen other than nasopharyngeal swab, presence of viral mutation(s) within the areas targeted by this assay, and inadequate number of viral copies(<138 copies/mL). A negative result must be combined with clinical observations, patient history, and epidemiological information. The expected result is Negative.  Fact Sheet for Patients:  EntrepreneurPulse.com.au  Fact Sheet for Healthcare Providers:  IncredibleEmployment.be  This test is no t yet approved or cleared by the [place] FDA and  has been authorized for detection and/or diagnosis of SARS-CoV-2 by FDA under an Emergency Use Authorization (EUA). This EUA will remain  in effect (meaning this test can be used) for the duration of the COVID-19 declaration under Section 564(b)(1) of the Act, 21 U.S.C.section 360bbb-3(b)(1), unless the authorization is terminated  or revoked sooner.       Influenza A by PCR NEGATIVE NEGATIVE Final   Influenza B by PCR NEGATIVE NEGATIVE Final    Comment: (NOTE) The Xpert Xpress SARS-CoV-2/FLU/RSV plus assay is intended as an aid in the diagnosis of influenza from Nasopharyngeal swab specimens and should not be used as a sole basis for treatment. Nasal washings and aspirates are unacceptable for Xpert Xpress SARS-CoV-2/FLU/RSV testing.  Fact Sheet for Patients: EntrepreneurPulse.com.au  Fact Sheet for Healthcare Providers: IncredibleEmployment.be  This test is not yet approved or cleared by the [place] FDA and has been authorized for detection and/or diagnosis of SARS-CoV-2 by FDA under an Emergency Use Authorization (EUA). This EUA will remain in effect (meaning this test can be used) for the duration of the COVID-19 declaration  under Section 564(b)(1) of the Act, 21 U.S.C. section 360bbb-3(b)(1), unless the authorization is terminated or revoked.  Performed at [facility], [address] [place], [place] [address]   MRSA Next Gen by PCR, Nasal     Status: None   Collection Time: 09/01/20  9:23 PM   Specimen: Nasal Mucosa; Nasal Swab  Result Value Ref Range Status   MRSA by PCR Next Gen NOT DETECTED NOT DETECTED Final    Comment: (NOTE) The GeneXpert MRSA Assay (FDA approved for NASAL specimens only), is one component of a comprehensive MRSA colonization surveillance program. It is not intended to diagnose MRSA infection nor to guide or monitor treatment for MRSA infections. Test performance is not FDA approved in patients less than 58 years old. Performed at [facility], [place], [place] [address]   CULTURE, BLOOD (ROUTINE X 2) w Reflex to ID Panel     Status:  None   Collection Time: 09/01/20  9:43 PM   Specimen: BLOOD  Result Value Ref Range Status   Specimen Description BLOOD RIGHT ASSIST CONTROL  Final   Special Requests   Final    BOTTLES DRAWN AEROBIC AND ANAEROBIC Blood Culture adequate volume   Culture   Final    NO GROWTH 5 DAYS Performed at [facility], [address] [place], [place] [address]    Report Status 09/06/2020 FINAL  Final  CULTURE, BLOOD (ROUTINE X 2) w Reflex to ID Panel     Status: None   Collection Time: 09/01/20  9:50 PM   Specimen: BLOOD  Result Value Ref Range Status   Specimen Description BLOOD RIGHT HAND  Final   Special Requests   Final    BOTTLES DRAWN AEROBIC ONLY Blood Culture results may not be optimal due to an inadequate volume of blood received in culture bottles   Culture   Final    NO GROWTH 5 DAYS Performed at [facility], [address] [place], [place] [address]    Report Status 09/06/2020 FINAL  Final  Culture, BAL-quantitative w Gram Stain     Status: None   Collection Time: 09/07/20  12:40 PM   Specimen: Bronchoalveolar Lavage; Respiratory  Result Value Ref Range Status   Specimen Description   Final    BRONCHIAL ALVEOLAR LAVAGE Performed at [facility], [address] [place], [place] [address]    Special Requests   Final    NONE Performed at [facility], [address] [place], [place] [address]    Gram Stain   Final    RARE WBC PRESENT, PREDOMINANTLY PMN FEW GRAM POSITIVE RODS RARE GRAM POSITIVE COCCI    Culture   Final    FEW Normal respiratory flora-no Staph aureus or Pseudomonas seen Performed at [facility], [place] [address], [place],  [address]    Report Status 09/09/2020 FINAL  Final      Scheduled Meds:  apixaban  5 mg Oral BID   arformoterol  15 mcg Nebulization BID   aspirin EC  81 mg Oral Daily   atorvastatin  40 mg Oral QHS   bisoprolol  5 mg Oral Daily   budesonide (PULMICORT) nebulizer solution  0.5 mg Nebulization BID   Chlorhexidine Gluconate Cloth  6 each Topical Daily   furosemide  40 mg Oral Daily   insulin aspart  0-9 Units Subcutaneous TID WC   ipratropium-albuterol  3 mL Nebulization Q6H   melatonin  5 mg Oral QHS   pantoprazole  40 mg Oral Daily   theophylline  300 mg Oral Q12H   Brief history.  Patient admitted by critical care team on 09/01/2020 with acute on chronic hypercapnic respiratory failure secondary to COPD exacerbation.  Patient was hooked up to the ventilator from his trach.  Patient was weaned to trach collar during the day.  The patient with his BiPAP mask was still retaining carbon dioxide.  Pulmonary did a bronchoscopy which suctioned out mucous plugs.  Pulmonary wants to set up invasive ventilation through his trach at night.  We needed the oxygen company to come in to evaluate.  This will also have to be approved through [employer].  Past medical history acute on chronic hypoxic hypercapnic respiratory failure, chronic systolic congestive heart failure, paroxysmal  atrial fibrillation, acute kidney injury on chronic kidney disease stage III OA, type 2 diabetes mellitus with hyperlipidemia, obesity and sleep apnea.  Assessment/Plan:  Acute on chronic hypoxic hypercapnic respiratory failure.  Patient had a bronchoscopy 2 days ago which pulled out much of mucous plugging.  They were unable to hook up the BiPAP through the trach last night.  We needed to call the oxygen company today to see if we can make this happen.  Then will need insurance approval also.  With the patient's repeated hospitalizations for similar events would like to have a good plan prior to going home.  The patient will need invasive ventilation that is compatible with his trach.  Settings 530. Chronic systolic heart failure on Lasix bisoprolol aspirin and statin. Paroxysmal atrial fibrillation on Eliquis.  Bisoprolol for rate control. Acute kidney injury on chronic kidney disease stage IIIa.  Today's creatinine 1.58.  Creatinine on presentation 1.26. Type 2 diabetes mellitus with hyperlipidemia.  Continue atorvastatin.  On sliding scale insulin.  Last hemoglobin A1c 6.6 Obesity with a BMI of 35.66.  Patient has sleep apnea and chronic hypoxic hypercapnic respiratory failure.  Trying to do a trial of invasive ventilation through the trach.     Code Status:     Code Status Orders  (From admission, onward)           Start     Ordered   09/01/20 2001  Full code  Continuous        09/01/20 2002           Code Status History     Date Active Date Inactive Code Status Order ID Comments User Context   07/23/2020 2053 07/26/2020 1904 Full Code [id number]  Rust-Chester, Huel Cote, NP ED   01/26/2020 2143 02/16/2020 1855 Full Code [id number]  Maudry Diego Inpatient   01/01/2020 1729 01/26/2020 1725 Full Code [id number]  Reubin Milan, MD ED   07/31/2019 1601 08/02/2019 1654 Full Code [id number]  Collier Bullock, MD ED   06/10/2019 1517 06/14/2019 1930 Full Code [id number]  Wyvonnia Dusky, MD ED   11/17/2018 1013 11/21/2018 1826 Full Code [id number]  Epifanio Lesches, MD ED   11/06/2018 0957 11/08/2018 1555 Full Code [id number]  Harrie Foreman, MD ED   07/29/2018 0034 07/30/2018 1938 Full Code [id number]  Mayer Camel, NP ED   02/03/2018 2201 02/06/2018 1552 Full Code [id number]  Dustin Flock, MD Inpatient   07/15/2016 0632 07/16/2016 1741 Full Code [id number]  Saundra Shelling, MD Inpatient   07/01/2016 1701 07/03/2016 1853 Full Code [id number]  Henreitta Leber, MD Inpatient   03/26/2016 0043 03/28/2016 2001 Full Code [id number]  Hugelmeyer, Alexis, DO Inpatient   11/17/2015 1131 11/18/2015 1845 Full Code [id number]  Bettey Costa, MD Inpatient   02/11/2015 1417 02/21/2015 1841 Full Code [id number]  Henreitta Leber, MD Inpatient   12/28/2014 2134 12/30/2014 1602 Full Code [id number]  Fritzi Mandes, MD Inpatient   08/24/2014 0245 08/26/2014 1819 Full Code [id number]  Lytle Butte, MD ED   08/14/2014 1031 08/18/2014 1830 Full Code [id number]  Vaughan Basta, MD Inpatient      Family Communication: Spoke with wife on the phone Disposition Plan: Status is: Inpatient  Dispo: The patient is from: Home              Anticipated d/c is to: Home              Patient currently doing better now.  Trying to set up noninvasive ventilation through his trach.  Needed to get the oxygen company to come into get the hookup and will need this approved by the  insurance company.   Difficult to place patient.  No.  Consultants: Critical care specialist  Procedures: Bronchoscopy  Time spent: 27 minutes.  Case discussed with critical care specialist  [facility]  [facility]

## 2020-09-09 NOTE — Progress Notes (Signed)
Patient had a 20 beat run of [place]. Pt was sleeping on NIV home vent. Asymptomatic. Now back in SR 74 bpm, BP 132/79. Dr. Damita Dunnings made aware. Awaiting further orders.

## 2020-09-10 DIAGNOSIS — N179 Acute kidney failure, unspecified: Secondary | ICD-10-CM | POA: Diagnosis not present

## 2020-09-10 DIAGNOSIS — I480 Paroxysmal atrial fibrillation: Secondary | ICD-10-CM | POA: Diagnosis not present

## 2020-09-10 DIAGNOSIS — J9602 Acute respiratory failure with hypercapnia: Secondary | ICD-10-CM | POA: Diagnosis not present

## 2020-09-10 DIAGNOSIS — J441 Chronic obstructive pulmonary disease with (acute) exacerbation: Secondary | ICD-10-CM

## 2020-09-10 LAB — CBC
HCT: 40.7 % (ref 39.0–52.0)
Hemoglobin: 13.2 g/dL (ref 13.0–17.0)
MCH: 28.7 pg (ref 26.0–34.0)
MCHC: 32.4 g/dL (ref 30.0–36.0)
MCV: 88.5 fL (ref 80.0–100.0)
Platelets: 181 10*3/uL (ref 150–400)
RBC: 4.6 MIL/uL (ref 4.22–5.81)
RDW: 12.8 % (ref 11.5–15.5)
WBC: 5.7 10*3/uL (ref 4.0–10.5)
nRBC: 0 % (ref 0.0–0.2)

## 2020-09-10 LAB — GLUCOSE, CAPILLARY
Glucose-Capillary: 122 mg/dL — ABNORMAL HIGH (ref 70–99)
Glucose-Capillary: 166 mg/dL — ABNORMAL HIGH (ref 70–99)
Glucose-Capillary: 99 mg/dL (ref 70–99)
Glucose-Capillary: 147 mg/dL — ABNORMAL HIGH (ref 70–99)

## 2020-09-10 NOTE — TOC Progression Note (Signed)
Transition of Care [facility]) - Progression Note    Patient Details  Name: BRENNON STUPP MRN: [id number] Date of Birth: [birth year]  Transition of Care [facility]) CM/SW [place], RN Phone Number: 09/10/2020, 1:46 PM  Clinical Narrative:  Spoke with patient with wife on the phone, wife says she could not stay overnight as previously discussed with [employer].  Wife will follow through with training, however feels that she will not benefit from staying overnight, wife says she also use a machine at night and she also received training in [place], therefore will not need to stay at night. I did inform her that I will discuss concerns with [employer] on Monday and will call her with update.          Expected Discharge Plan and Services                                                 Social Determinants of Health (SDOH) Interventions    Readmission Risk Interventions Readmission Risk Prevention Plan 11/19/2018 07/30/2018 07/29/2018  Transportation Screening Complete Complete Complete  PCP or Specialist Appt within 3-5 Days - Complete Complete  HRI or [place] - Complete Complete  Social Work Consult for [place] Planning/Counseling - - Patient refused  Palliative Care Screening - Not Applicable Not Applicable  Medication Review Press photographer) Referral to Pharmacy Complete Complete  PCP or Specialist appointment within 3-5 days of discharge Complete - -  Mutual or Home Care Consult Patient refused - -  SW Recovery Care/Counseling Consult Complete - -  Palliative Care Screening Not Applicable - -  [place] Not Applicable - -  Some recent data might be hidden

## 2020-09-10 NOTE — Progress Notes (Signed)
Patient ID: [name], male   DOB: Jan 21, 1971, 50 y.o.   MRN: [id number] [facility] PROGRESS NOTE  [name] TDH:[id number] DOB: [birth year] DOA: 09/01/2020 PCP: [place]  Brief history.  Patient admitted by critical care team on 09/01/2020 with acute on chronic hypercapnic respiratory failure secondary to COPD exacerbation.  Patient was hooked up to the ventilator from his trach.  Patient was weaned to trach collar during the day.  The patient with his BiPAP mask was still retaining carbon dioxide.  Pulmonary did a bronchoscopy which suctioned out mucous plugs.  Pulmonary wants to set up invasive ventilation through his trach at night.  We needed the oxygen company to come in to evaluate.  This will also have to be approved through [employer].  Past medical history acute on chronic hypoxic hypercapnic respiratory failure, chronic systolic congestive heart failure, paroxysmal atrial fibrillation, acute kidney injury on chronic kidney disease stage III OA, type 2 diabetes mellitus with hyperlipidemia, obesity and sleep apnea. Currently stable but needs ventilator for home.  Waiting for insurance approval and equipment to be delivered before discharge. Recurrent admissions for acute on chronic hypercapnic respiratory failure.  Assessment/Plan:  Acute on chronic hypoxic hypercapnic respiratory failure.  Patient had a bronchoscopy 3  days ago which pulled out much of mucous plugging.  They were unable to hook up the BiPAP through the trach last night.  We needed to call the oxygen company today to see if we can make this happen.  Then will need insurance approval also.  With the patient's repeated hospitalizations for similar events would like to have a good plan prior to going home.  The patient will need invasive ventilation that is compatible with his trach.  Settings 530. Chronic systolic heart failure on Lasix bisoprolol aspirin and statin. Paroxysmal  atrial fibrillation on Eliquis.  Bisoprolol for rate control. Acute kidney injury on chronic kidney disease stage IIIa.  Mild increase in creatinine.  Will monitor renal function and avoid nephrotoxins. Type 2 diabetes mellitus with hyperlipidemia.  Continue atorvastatin.  On sliding scale insulin.  Last hemoglobin A1c 6.6 Obesity with a BMI of 35.66.  Patient has sleep apnea and chronic hypoxic hypercapnic respiratory failure.  Trying to do a trial of invasive ventilation through the trach.  HPI/Subjective: Patient was seen and examined today.  Eating his lunch.  No new complaints.  Waiting for his machine to get delivered.  Objective: Vitals:   09/10/20 1200 09/10/20 1421  BP: 116/64   Pulse: 78   Resp: (!) 21   Temp: 98.7 F (37.1 C)   SpO2: 92% 96%    Intake/Output Summary (Last 24 hours) at 09/10/2020 1600 Last data filed at 09/10/2020 1200 Gross per 24 hour  Intake 750 ml  Output 2150 ml  Net -1400 ml    Filed Weights   09/08/20 0419 09/09/20 0353 09/10/20 0500  Weight: 122.7 kg 122.6 kg 123.6 kg   Exam: General.  Well-developed gentleman, in no acute distress.  Trach collar in place Pulmonary.  Lungs clear bilaterally, normal respiratory effort. CV.  Regular rate and rhythm, no JVD, rub or murmur. Abdomen.  Soft, nontender, nondistended, BS positive. CNS.  Alert and oriented.  No focal neurologic deficit. Extremities.  No edema, no cyanosis, pulses intact and symmetrical. Psychiatry.  Judgment and insight appears normal.   Data Reviewed: Basic Metabolic Panel: Recent Labs  Lab 09/04/20 0506 09/05/20 0459 09/06/20 0350 09/07/20 0445 09/08/20 0518 09/09/20 0606  NA 142  140 139 140 144 139  K 4.4 4.2 4.0 3.9 4.5 4.3  CL 101 98 97* 100 102 102  CO2 37* 36* 37* 34* 35* 33*  GLUCOSE 169* 141* 146* 138* 121* 170*  BUN 26* 24* 24* 24* 25* 23*  CREATININE 1.50* 1.49* 1.48* 1.59* 1.58* 1.58*  CALCIUM 8.5* 8.8* 8.7* 8.7* 8.7* 8.2*  MG 2.2 2.3 2.2 2.1 2.0 2.0  PHOS  3.1 3.5 2.7 2.9 3.6  --     CBC: Recent Labs  Lab 09/04/20 0506 09/05/20 0459 09/06/20 0350 09/07/20 0445 09/10/20 0633  WBC 6.0 6.1 6.7 5.3 5.7  HGB 13.6 14.0 13.3 13.0 13.2  HCT 42.9 44.3 43.0 40.8 40.7  MCV 90.5 89.5 87.9 88.9 88.5  PLT 187 199 204 192 181     BNP (last 3 results) Recent Labs    01/01/20 2028 07/23/20 1734 09/01/20 1531  BNP 102.4* 218.9* 113.9*     CBG: Recent Labs  Lab 09/09/20 1618 09/09/20 2015 09/10/20 0732 09/10/20 1136 09/10/20 1532  GLUCAP 187* 127* 122* 166* 99     Recent Results (from the past 240 hour(s))  Resp Panel by RT-PCR (Flu A&B, Covid) Nasopharyngeal Swab     Status: None   Collection Time: 09/01/20  7:14 PM   Specimen: Nasopharyngeal Swab; Nasopharyngeal(NP) swabs in vial transport medium  Result Value Ref Range Status   SARS Coronavirus 2 by RT PCR NEGATIVE NEGATIVE Final    Comment: (NOTE) SARS-CoV-2 target nucleic acids are NOT DETECTED.  The SARS-CoV-2 RNA is generally detectable in upper respiratory specimens during the acute phase of infection. The lowest concentration of SARS-CoV-2 viral copies this assay can detect is 138 copies/mL. A negative result does not preclude SARS-Cov-2 infection and should not be used as the sole basis for treatment or other patient management decisions. A negative result may occur with  improper specimen collection/handling, submission of specimen other than nasopharyngeal swab, presence of viral mutation(s) within the areas targeted by this assay, and inadequate number of viral copies(<138 copies/mL). A negative result must be combined with clinical observations, patient history, and epidemiological information. The expected result is Negative.  Fact Sheet for Patients:  EntrepreneurPulse.com.au  Fact Sheet for Healthcare Providers:  IncredibleEmployment.be  This test is no t yet approved or cleared by the [place] FDA and  has been  authorized for detection and/or diagnosis of SARS-CoV-2 by FDA under an Emergency Use Authorization (EUA). This EUA will remain  in effect (meaning this test can be used) for the duration of the COVID-19 declaration under Section 564(b)(1) of the Act, 21 U.S.C.section 360bbb-3(b)(1), unless the authorization is terminated  or revoked sooner.       Influenza A by PCR NEGATIVE NEGATIVE Final   Influenza B by PCR NEGATIVE NEGATIVE Final    Comment: (NOTE) The Xpert Xpress SARS-CoV-2/FLU/RSV plus assay is intended as an aid in the diagnosis of influenza from Nasopharyngeal swab specimens and should not be used as a sole basis for treatment. Nasal washings and aspirates are unacceptable for Xpert Xpress SARS-CoV-2/FLU/RSV testing.  Fact Sheet for Patients: EntrepreneurPulse.com.au  Fact Sheet for Healthcare Providers: IncredibleEmployment.be  This test is not yet approved or cleared by the [place] FDA and has been authorized for detection and/or diagnosis of SARS-CoV-2 by FDA under an Emergency Use Authorization (EUA). This EUA will remain in effect (meaning this test can be used) for the duration of the COVID-19 declaration under Section 564(b)(1) of the Act, 21 U.S.C. section 360bbb-3(b)(1), unless the  authorization is terminated or revoked.  Performed at [facility], [address] [place], [place] [address]   MRSA Next Gen by PCR, Nasal     Status: None   Collection Time: 09/01/20  9:23 PM   Specimen: Nasal Mucosa; Nasal Swab  Result Value Ref Range Status   MRSA by PCR Next Gen NOT DETECTED NOT DETECTED Final    Comment: (NOTE) The GeneXpert MRSA Assay (FDA approved for NASAL specimens only), is one component of a comprehensive MRSA colonization surveillance program. It is not intended to diagnose MRSA infection nor to guide or monitor treatment for MRSA infections. Test performance is not FDA approved in patients less  than 27 years old. Performed at [facility], [address] [place], [place] [address]   CULTURE, BLOOD (ROUTINE X 2) w Reflex to ID Panel     Status: None   Collection Time: 09/01/20  9:43 PM   Specimen: BLOOD  Result Value Ref Range Status   Specimen Description BLOOD RIGHT ASSIST CONTROL  Final   Special Requests   Final    BOTTLES DRAWN AEROBIC AND ANAEROBIC Blood Culture adequate volume   Culture   Final    NO GROWTH 5 DAYS Performed at [facility], [address] [place], [place] [address]    Report Status 09/06/2020 FINAL  Final  CULTURE, BLOOD (ROUTINE X 2) w Reflex to ID Panel     Status: None   Collection Time: 09/01/20  9:50 PM   Specimen: BLOOD  Result Value Ref Range Status   Specimen Description BLOOD RIGHT HAND  Final   Special Requests   Final    BOTTLES DRAWN AEROBIC ONLY Blood Culture results may not be optimal due to an inadequate volume of blood received in culture bottles   Culture   Final    NO GROWTH 5 DAYS Performed at [facility], [address], [place], [place] [address]    Report Status 09/06/2020 FINAL  Final  Culture, BAL-quantitative w Gram Stain     Status: None   Collection Time: 09/07/20 12:40 PM   Specimen: Bronchoalveolar Lavage; Respiratory  Result Value Ref Range Status   Specimen Description   Final    BRONCHIAL ALVEOLAR LAVAGE Performed at [facility], [address], [place], [place] [address]    Special Requests   Final    NONE Performed at [facility], [address] [place], [place] [address]    Gram Stain   Final    RARE WBC PRESENT, PREDOMINANTLY PMN FEW GRAM POSITIVE RODS RARE GRAM POSITIVE COCCI    Culture   Final    FEW Normal respiratory flora-no Staph aureus or Pseudomonas seen Performed at [facility], [address] [place], [place] [address]    Report Status 09/09/2020 FINAL  Final       Scheduled Meds:  apixaban  5 mg Oral BID   arformoterol   15 mcg Nebulization BID   aspirin EC  81 mg Oral Daily   atorvastatin  40 mg Oral QHS   bisoprolol  5 mg Oral Daily   budesonide (PULMICORT) nebulizer solution  0.5 mg Nebulization BID   Chlorhexidine Gluconate Cloth  6 each Topical Daily   furosemide  40 mg Oral Daily   insulin aspart  0-9 Units Subcutaneous TID WC   ipratropium-albuterol  3 mL Nebulization Q6H   melatonin  5 mg Oral QHS   pantoprazole  40 mg Oral Daily  theophylline  300 mg Oral Q12H      Code Status:     Code Status Orders  (From admission, onward)           Start     Ordered   09/01/20 2001  Full code  Continuous        09/01/20 2002           Code Status History     Date Active Date Inactive Code Status Order ID Comments User Context   07/23/2020 2053 07/26/2020 1904 Full Code [id number]  Rust-Chester, Huel Cote, NP ED   01/26/2020 2143 02/16/2020 1855 Full Code [id number]  Maudry Diego Inpatient   01/01/2020 1729 01/26/2020 1725 Full Code [id number]  Reubin Milan, MD ED   07/31/2019 1601 08/02/2019 1654 Full Code [id number]  Collier Bullock, MD ED   06/10/2019 1517 06/14/2019 1930 Full Code [id number]  Wyvonnia Dusky, MD ED   11/17/2018 1013 11/21/2018 1826 Full Code [id number]  Epifanio Lesches, MD ED   11/06/2018 0957 11/08/2018 1555 Full Code [id number]  Harrie Foreman, MD ED   07/29/2018 0034 07/30/2018 1938 Full Code [id number]  Mayer Camel, NP ED   02/03/2018 2201 02/06/2018 1552 Full Code [id number]  Dustin Flock, MD Inpatient   07/15/2016 0632 07/16/2016 1741 Full Code [id number]  Saundra Shelling, MD Inpatient   07/01/2016 1701 07/03/2016 1853 Full Code [id number]  Henreitta Leber, MD Inpatient   03/26/2016 0043 03/28/2016 2001 Full Code [id number]  Hugelmeyer, Alexis, DO Inpatient   11/17/2015 1131 11/18/2015 1845 Full Code [id number]  Bettey Costa, MD Inpatient   02/11/2015 1417 02/21/2015 1841 Full Code [id number]  Henreitta Leber, MD Inpatient   12/28/2014 2134 12/30/2014 1602 Full  Code [id number]  Fritzi Mandes, MD Inpatient   08/24/2014 0245 08/26/2014 1819 Full Code [id number]  Lytle Butte, MD ED   08/14/2014 1031 08/18/2014 1830 Full Code [id number]  Vaughan Basta, MD Inpatient      Family Communication: Discussed with patient Disposition Plan: Status is: Inpatient  Dispo: The patient is from: Home              Anticipated d/c is to: Home              Patient currently doing better now.  Trying to set up noninvasive ventilation through his trach.  Needed to get the oxygen company to come into get the hookup and will need this approved by the insurance company.  Most likely will be done on Monday   Difficult to place patient.  No.  Consultants: Critical care specialist  Procedures: Bronchoscopy  Time spent: 27 minutes.    This record has been created using Systems analyst. Errors have been sought and corrected,but may not always be located. Such creation errors do not reflect on the standard of care.   [facility]

## 2020-09-10 NOTE — Progress Notes (Signed)
NAME:  [name], MRN:  [id number], DOB:  1971-01-26, LOS: 9 ADMISSION DATE:  09/01/2020, CONSULTATION DATE:  09/01/2020 REFERRING MD:  Blake Divine MD. 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  Patient is currently on the vent therefore history is limited. Per EMS run sheet, no reports of associated symptoms of chest pain, fevers or chills, worsening productive cough from baseline.  ED Course: On arrival to the ED, he was afebrile with blood pressure 173/100 mm Hg and pulse rate 103 beats/min, RR 30 breaths per minute. Due to persistent hypoxia, patient was placed on the vent. He received solumedrol and Duonebs treatments as well. PCCM consulted to admit to ICU for further management.   Significant Hospital Events   7/28: admit to ICU with acute on chronic hypercapnic respiratory failure requiring ventilator support in the setting of a chronic tracheostomy. 7/29: Tolerated pressure support trial, placed on trach collar 09/05/20- patient is improved.  He still has cough.  Lung auscultation is imporved.  CXR today reviewed.  NIV care reviewed with patient.  09/06/20- no overnight events. Discussed bronchoscopy today.  Spke with wife over phone and reviewed with his outpaitent pulmonologist 09/07/20- patient improved , he had bronchoscopy today with removal of mucus plugging and plan for home device ResMED iVAPS trial tonight in MICU 09/08/20- patient is improved we reviewed trache and RESmed device he used overnight, his ABG is improved but still with borderline hypercapnia.  He will attept to use trache overnight instead and will be education on self suctioning prior to dc home.  09/09/20- patient is having [employer]  his machine and get new device for invasive ventilation.  Discussed with Dr Leslye Peer will need to trial one more day.  09/10/20- patient was unable to get new device yet and is waiting for ventilator to be delivered by [employer].   Consults:  PCCM   Significant Diagnostic Tests:  7/28: Chest Xray>Increased hazy opacities of the LEFT lung base. Differential considerations include infection, aspiration or atelectasis  Micro Data:  7/28: SARS-CoV-2 PCR> negative 7/28: Influenza PCR> negative 7/28: Blood culture x2> 7/28: MRSA PCR> 7/28: Tracheal aspirate>  Antimicrobials:  NONE  OBJECTIVE  Blood pressure (!) 101/53, pulse 78, temperature 98.1 F (36.7 C), temperature source Oral, resp. rate (!) 23, height '6\' 1"'$  (1.854 m), weight 123.6 kg, SpO2 95 %.    FiO2 (%):  [28 %] 28 %   Intake/Output Summary (Last 24 hours) at 09/10/2020 0959 Last data filed at 09/10/2020 0831 Gross per 24 hour  Intake 480 ml  Output 2700 ml  Net -2220 ml    Filed Weights   09/08/20 0419 09/09/20 0353 09/10/20 0500  Weight: 122.7 kg 122.6 kg 123.6 kg    Physical Examination  GENERAL: Middle-age obese African-American male, lying on the bed, on the vent via trach EYES: Pupils equal, round, reactive to light and accommodation. No scleral icterus. Extraocular muscles intact.  HEENT: Head atraumatic, normocephalic. Oropharynx and nasopharynx clear.  NECK:  Supple, no jugular venous distention. No thyroid enlargement, no tenderness. Trach incision intact LUNGS: mildly rhonchorous CARDIOVASCULAR: S1, S2 normal. No murmurs, rubs, or gallops.  ABDOMEN: Soft, nontender, nondistended. Bowel sounds present. No organomegaly or mass.  EXTREMITIES: No pedal edema, cyanosis,  or clubbing.  NEUROLOGIC: Cranial nerves II through XII are intact.  Muscle strength 5/5 in all extremities. Sensation intact. SKIN: No obvious rash, lesion, or ulcer.   Assessment & Plan:   Acute on chronic hypoxic/hypercapnic Respiratory Failure  possibly due to mucous plugging and advanced COPD           -s/p bronchoscopy - evacuated thickened inspissated mucus plugging           - he has some issues using his trache and unable to ventilate with mask due to interface leakage.  -He had mucous plugging this morning, had aggressive suctioning- Continue chest PT Continue home meds for COPD  Chronic systolic congestive Heart Failure NYHA class II (Last known EF 45-50%) Patient looks euvolemic Hypertension CAD s/p cardiac cath, HLD  Monitor intake and output Continue Lasix 40 mg IV At home he takes 80 mg p.o. Holding lisinopril considering AKI Continue metoprolol, aspirin and atorvastatin  Paroxysmal Atrial Fibrillation Currently in sinus rhythm CHA2DS2-VASc score: 3 Continue Eliquis for stroke prophylaxis Continue Metoprolol for rate control once able to take po  Acute on CKD Stage III Hypophosphatemia Monitor I&O's / urinary output Closely monitor serum creatinine and electrolytes Avoid nephrotoxic agents as able Replace electrolytes as indicated   Diabetes mellitus Type ll -CBGs -Sliding scale insulin -Follow ICU hyper/hypoglycemia protocol  Best practice:  Diet:  NPO, speech and swallow evaluation Pain/Anxiety/Delirium protocol (if indicated): N/A VAP protocol (if indicated): Yes DVT prophylaxis: Systemic AC GI prophylaxis: H2B Glucose control:  SSI Yes Central venous access:  N/A Arterial line:  N/A Foley:  N/A Mobility: As tolerated PT consulted: Yes Last date of multidisciplinary goals of care discussion [7/28] Code Status:  full code Disposition: ICU   = Goals of Care = Code Status Order: FULL  Primary Emergency Contact: Kinser,[name] Wishes to pursue full aggressive treatment and intervention options, including CPR and intubation, goals of care will be addressed on going with family if that should become necessary.   Total critical care time: 35 minutes    Critical care time was exclusive of  separately billable procedures and treating other patients.   Critical care was necessary to treat or prevent imminent or life-threatening deterioration.   Critical care was time spent personally by me on the following activities: development of treatment plan with patient and/or surrogate as well as nursing, discussions with consultants, evaluation of patient's response to treatment, examination of patient, obtaining history from patient or surrogate, ordering and performing treatments and interventions, ordering and review of laboratory studies, ordering and review of radiographic studies, pulse oximetry and re-evaluation of patient's condition.    Ottie Glazier, M.D.  [facility] [place]    .

## 2020-09-11 DIAGNOSIS — J9621 Acute and chronic respiratory failure with hypoxia: Secondary | ICD-10-CM | POA: Diagnosis not present

## 2020-09-11 LAB — GLUCOSE, CAPILLARY
Glucose-Capillary: 135 mg/dL — ABNORMAL HIGH (ref 70–99)
Glucose-Capillary: 173 mg/dL — ABNORMAL HIGH (ref 70–99)
Glucose-Capillary: 131 mg/dL — ABNORMAL HIGH (ref 70–99)
Glucose-Capillary: 135 mg/dL — ABNORMAL HIGH (ref 70–99)

## 2020-09-11 LAB — BASIC METABOLIC PANEL
Anion gap: 4 — ABNORMAL LOW (ref 5–15)
BUN: 20 mg/dL (ref 6–20)
CO2: 34 mmol/L — ABNORMAL HIGH (ref 22–32)
Calcium: 8.2 mg/dL — ABNORMAL LOW (ref 8.9–10.3)
Chloride: 100 mmol/L (ref 98–111)
Creatinine, Ser: 1.4 mg/dL — ABNORMAL HIGH (ref 0.61–1.24)
GFR, Estimated: >60 mL/min (ref >60)
Glucose, Bld: 163 mg/dL — ABNORMAL HIGH (ref 70–99)
Potassium: 4.2 mmol/L (ref 3.5–5.1)
Sodium: 138 mmol/L (ref 135–145)

## 2020-09-11 MED ORDER — IPRATROPIUM-ALBUTEROL 0.5-2.5 (3) MG/3ML IN SOLN
3.0000 mL | Freq: Three times a day (TID) | RESPIRATORY_TRACT | Status: DC
  Administered 2020-09-12: 3 mL via RESPIRATORY_TRACT
  Filled 2020-09-11 (×2): qty 3

## 2020-09-11 NOTE — Plan of Care (Signed)

## 2020-09-11 NOTE — Progress Notes (Signed)
NAME:  [name], MRN:  [id number], DOB:  [birth year], LOS: 10 ADMISSION DATE:  09/01/2020, CONSULTATION DATE:  09/01/2020 REFERRING MD:  Blake Divine MD. 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  Patient is currently on the vent therefore history is limited. Per EMS run sheet, no reports of associated symptoms of chest pain, fevers or chills, worsening productive cough from baseline.  ED Course: On arrival to the ED, he was afebrile with blood pressure 173/100 mm Hg and pulse rate 103 beats/min, RR 30 breaths per minute. Due to persistent hypoxia, patient was placed on the vent. He received solumedrol and Duonebs treatments as well. PCCM consulted to admit to ICU for further management.   Significant Hospital Events   7/28: admit to ICU with acute on chronic hypercapnic respiratory failure requiring ventilator support in the setting of a chronic tracheostomy. 7/29: Tolerated pressure support trial, placed on trach collar 09/05/20- patient is improved.  He still has cough.  Lung auscultation is imporved.  CXR today reviewed.  NIV care reviewed with patient.  09/06/20- no overnight events. Discussed bronchoscopy today.  Spke with wife over phone and reviewed with his outpaitent pulmonologist 09/07/20- patient improved , he had bronchoscopy today with removal of mucus plugging and plan for home device ResMED iVAPS trial tonight in MICU 09/08/20- patient is improved we reviewed trache and RESmed device he used overnight, his ABG is improved but still with borderline hypercapnia.  He will attept to use trache overnight instead and will be education on self suctioning prior to dc home.  09/09/20- patient is having [place] his machine and get new device for invasive ventilation.  Discussed with Dr Leslye Peer will need to trial one more day.  09/10/20- patient was unable to get new device yet and is waiting for ventilator to be delivered by [employer].  09/11/20- patient still waiting on invasive ventilator to use with trache prior to dc home.  No overnight events. Patietn in SDU due to tracheostomy complications. PCCM will sign off and available as needed.   Consults:  PCCM   Significant Diagnostic Tests:  7/28: Chest Xray>Increased hazy opacities of the LEFT lung base. Differential considerations include infection, aspiration or atelectasis  Micro Data:  7/28: SARS-CoV-2 PCR> negative 7/28: Influenza PCR> negative 7/28: Blood culture x2> 7/28: MRSA PCR> 7/28: Tracheal aspirate>  Antimicrobials:  NONE  OBJECTIVE  Blood pressure 119/72, pulse 80, temperature 98.3 F (36.8 C), temperature source Oral, resp. rate 19, height '6\' 1"'$  (1.854 m), weight 123.6 kg, SpO2 95 %.    FiO2 (%):  [35 %] 35 %   Intake/Output Summary (Last 24 hours) at 09/11/2020 1128 Last data filed at 09/11/2020 0800 Gross per 24 hour  Intake 1222 ml  Output 1825 ml  Net -603 ml    Filed Weights   09/08/20 0419 09/09/20 0353 09/10/20 0500  Weight: 122.7 kg 122.6 kg 123.6 kg    Physical Examination  GENERAL: Middle-age obese African-American male, lying on the bed, on the vent via trach EYES: Pupils equal, round, reactive to light and accommodation. No scleral icterus. Extraocular muscles intact.  HEENT: Head atraumatic, normocephalic. Oropharynx and nasopharynx clear.  NECK:  Supple, no jugular venous distention. No thyroid enlargement, no tenderness.  Trach incision intact LUNGS: mildly rhonchorous CARDIOVASCULAR: S1, S2 normal. No murmurs, rubs, or gallops.  ABDOMEN: Soft, nontender, nondistended. Bowel sounds present. No organomegaly or mass.  EXTREMITIES: No pedal edema, cyanosis, or clubbing.  NEUROLOGIC: Cranial nerves II  through XII are intact.  Muscle strength 5/5 in all extremities. Sensation intact. SKIN: No obvious rash, lesion, or ulcer.   Assessment & Plan:   Acute on chronic hypoxic/hypercapnic Respiratory Failure possibly due to mucous plugging and advanced COPD           -s/p bronchoscopy - evacuated thickened inspissated mucus plugging           - he has some issues using his trache and unable to ventilate with mask due to interface leakage.  -He had mucous plugging this morning, had aggressive suctioning- Continue chest PT Continue home meds for COPD  Chronic systolic congestive Heart Failure NYHA class II (Last known EF 45-50%) Patient looks euvolemic Hypertension CAD s/p cardiac cath, HLD  Monitor intake and output Continue Lasix 40 mg IV At home he takes 80 mg p.o. Holding lisinopril considering AKI Continue metoprolol, aspirin and atorvastatin  Paroxysmal Atrial Fibrillation Currently in sinus rhythm CHA2DS2-VASc score: 3 Continue Eliquis for stroke prophylaxis Continue Metoprolol for rate control once able to take po  Acute on CKD Stage III Hypophosphatemia Monitor I&O's / urinary output Closely monitor serum creatinine and electrolytes Avoid nephrotoxic agents as able Replace electrolytes as indicated   Diabetes mellitus Type ll -CBGs -Sliding scale insulin -Follow ICU hyper/hypoglycemia protocol  Best practice:  Diet:  NPO, speech and swallow evaluation Pain/Anxiety/Delirium protocol (if indicated): N/A VAP protocol (if indicated): Yes DVT prophylaxis: Systemic AC GI prophylaxis: H2B Glucose control:  SSI Yes Central venous access:  N/A Arterial line:  N/A Foley:  N/A Mobility: As tolerated PT consulted: Yes Last date of multidisciplinary goals of care discussion [7/28] Code Status:  full code Disposition: ICU   = Goals of Care = Code Status Order: FULL  Primary Emergency Contact: Falk,[name] Wishes to pursue full aggressive treatment and intervention  options, including CPR and intubation, goals of care will be addressed on going with family if that should become necessary.   Total critical care time: 35 minutes    Critical care time was exclusive of separately billable procedures and treating other patients.   Critical care was necessary to treat or prevent imminent or life-threatening deterioration.   Critical care was time spent personally by me on the following activities: development of treatment plan with patient and/or surrogate as well as nursing, discussions with consultants, evaluation of patient's response to treatment, examination of patient, obtaining history from patient or surrogate, ordering and performing treatments and interventions, ordering and review of laboratory studies, ordering and review of radiographic studies, pulse oximetry and re-evaluation of patient's condition.    Ottie Glazier, M.D.  [facility]    .

## 2020-09-11 NOTE — Progress Notes (Signed)
[facility] PROGRESS NOTE    STEVE YOUNGBERG  BSW:[id number] DOB: [birth year] DOA: 09/01/2020 PCP: Rew      Brief Narrative:  [name] is a 50 y.o. M with obesity, sCHF EF 45-50%, hx renal CA partial nephrectomy, CKD IIIa, COPD FEV1 26%pred with chronic tracheostomy, hx Sarcoidosis, hx OSA on BiPAP, hx stroke and hx pAF on Eliquis who prsented with dyspnea 1 week.  In the ER he was tachypneic to 30 breaths/min, tachycardic to 100, and due to persistent hypoxia in the ER he was placed on ventilator, started on steroids and bronchodilators.  Chest x-ray with bilateral opacities.    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  Diuresed 13 L, respiratory status returned to baseline -Continue home furosemide  Hypertension Coronary artery disease, secondary prevention Pressure controlled No chest pain -Continue aspirin, atorvastatin, furosemide, bisoprolol  Paroxysmal atrial fibrillation -Continue Eliquis -Continue bisoprolol  Diabetes Glucose well controlled - Continue sliding scale corrections  History renal cancer  CKD IIIa AKI ruled out            Disposition: Status is: Inpatient  Remains inpatient appropriate because:Unsafe d/c plan  Dispo: The patient is from: Home              Anticipated d/c is to: Home              Patient currently is not medically stable to d/c.   Difficult to place patient No   Patient was admitted with respiratory failure.   Pulmonology recommended he be equipped with trilogy prior to discharge, and it has been recommended that this happen after testing in the hospital.  The device will be delivered on Monday, likely training Monday night and discharged Tuesday.    Level of care: Med-Surg       MDM: The below labs and imaging reports were reviewed and summarized above.  Medication management as above.   DVT prophylaxis:  apixaban (ELIQUIS) tablet 5 mg  Code Status: Full code Family Communication:     Consultants:  Pulmonology  Procedures:  8/3 bronchoscopy: Lots of mucous plug  Antimicrobials:     Culture data:             Subjective: Patient feels at baseline.  No respiratory distress, no wheezing, no swelling.  No chest discomfort.  Objective: Vitals:   09/11/20 0500 09/11/20 0600 09/11/20 0724 09/11/20 0800  BP:      Pulse: 72 73  80  Resp: (!) 32   19  Temp:    98.3 F (36.8 C)  TempSrc:    Oral  SpO2: 100% 100% 100% 95%  Weight:      Height:        Intake/Output Summary (Last 24 hours) at 09/11/2020 4665 Last data filed at 09/11/2020 0800 Gross per 24 hour  Intake 1222 ml  Output 1825 ml  Net -603 ml   Filed Weights   09/08/20 0419 09/09/20 0353 09/10/20 0500  Weight: 122.7 kg 122.6 kg 123.6 kg    Examination: General appearance:  adult male, alert and  in no acute distress.  Into the bed, eating breakfast. HEENT: Anicteric, conjunctiva pink, lids and lashes normal. No nasal deformity, discharge, epistaxis.  Lips moist  Skin: Warm and dry.   No suspicious rashes or lesions. Cardiac: RRR, nl S1-S2, no murmurs appreciated.  Capillary refill is brisk.  No JVD, no LE edema.  Radial  pulses 2+ and symmetric. Respiratory: Normal respiratory rate and rhythm.  CTAB without rales or wheezes. Abdomen:    MSK: No deformities or effusions.  Normal muscle bulk and tone. Neuro: Awake and alert.  EOMI, moves all extremities. Speech fluent.    Psych: Sensorium intact and  responding to questions, attention normal. Affect normal.  Judgment and insight appear normal.    Data Reviewed: I have personally reviewed following labs and imaging studies:  CBC: Recent Labs  Lab 09/05/20 0459 09/06/20 0350 09/07/20 0445 09/10/20 0633  WBC 6.1 6.7 5.3 5.7  HGB 14.0 13.3 13.0 13.2  HCT 44.3 43.0 40.8 40.7  MCV 89.5 87.9 88.9 88.5  PLT 199 204 192 867   Basic Metabolic Panel: Recent Labs  Lab 09/05/20 0459 09/06/20 0350 09/07/20 0445 09/08/20 0518 09/09/20 0606 09/11/20 0440  NA 140 139 140 144 139 138  K 4.2 4.0 3.9 4.5 4.3 4.2  CL 98 97* 100 102 102 100  CO2 36* 37* 34* 35* 33* 34*  GLUCOSE 141* 146* 138* 121* 170* 163*  BUN 24* 24* 24* 25* 23* 20  CREATININE 1.49* 1.48* 1.59* 1.58* 1.58* 1.40*  CALCIUM 8.8* 8.7* 8.7* 8.7* 8.2* 8.2*  MG 2.3 2.2 2.1 2.0 2.0  --   PHOS 3.5 2.7 2.9 3.6  --   --    GFR: Estimated Creatinine Clearance: 87 mL/min (A) (by C-G formula based on SCr of 1.4 mg/dL (H)). Liver Function Tests: No results for input(s): AST, ALT, ALKPHOS, BILITOT, PROT, ALBUMIN in the last 168 hours. No results for input(s): LIPASE, AMYLASE in the last 168 hours. No results for input(s): AMMONIA in the last 168 hours. Coagulation Profile: No results for input(s): INR, PROTIME in the last 168 hours. Cardiac Enzymes: No results for input(s): CKTOTAL, CKMB, CKMBINDEX, TROPONINI in the last 168 hours. BNP (last 3 results) No results for input(s): PROBNP in the last 8760 hours. HbA1C: No results for input(s): HGBA1C in the last 72 hours. CBG: Recent Labs  Lab 09/10/20 0732 09/10/20 1136 09/10/20 1532 09/10/20 2119 09/11/20 0756  GLUCAP 122* 166* 99 147* 135*   Lipid Profile: No results for input(s): CHOL, HDL, LDLCALC, TRIG, CHOLHDL, LDLDIRECT in the last 72 hours. Thyroid Function Tests: No results for input(s): TSH, T4TOTAL, FREET4, T3FREE, THYROIDAB in the last 72 hours. Anemia Panel: No results for input(s): VITAMINB12, FOLATE,  FERRITIN, TIBC, IRON, RETICCTPCT in the last 72 hours. Urine analysis:    Component Value Date/Time   COLORURINE YELLOW 01/27/2020 1000   APPEARANCEUR CLEAR 01/27/2020 1000   APPEARANCEUR Hazy 06/01/2013 1139   LABSPEC 1.018 01/27/2020 1000   LABSPEC 1.030 06/01/2013 1139   PHURINE 5.0 01/27/2020 1000   GLUCOSEU NEGATIVE 01/27/2020 1000   GLUCOSEU Negative 06/01/2013 1139   HGBUR NEGATIVE 01/27/2020 1000   BILIRUBINUR NEGATIVE 01/27/2020 1000   BILIRUBINUR Negative 06/01/2013 1139   KETONESUR NEGATIVE 01/27/2020 1000   PROTEINUR 30 (A) 01/27/2020 1000   NITRITE NEGATIVE 01/27/2020 1000   LEUKOCYTESUR NEGATIVE 01/27/2020 1000   LEUKOCYTESUR Negative 06/01/2013 1139   Sepsis Labs: _0 (procalcitonin:4,lacticacidven:4)  ) Recent Results (from the past 240 hour(s))  Resp Panel by RT-PCR (Flu A&B,  Covid) Nasopharyngeal Swab     Status: None   Collection Time: 09/01/20  7:14 PM   Specimen: Nasopharyngeal Swab; Nasopharyngeal(NP) swabs in vial transport medium  Result Value Ref Range Status   SARS Coronavirus 2 by RT PCR NEGATIVE NEGATIVE Final    Comment: (NOTE) SARS-CoV-2 target nucleic acids are NOT DETECTED.  The SARS-CoV-2 RNA is generally detectable in upper respiratory specimens during the acute phase of infection. The lowest concentration of SARS-CoV-2 viral copies this assay can detect is 138 copies/mL. A negative result does not preclude SARS-Cov-2 infection and should not be used as the sole basis for treatment or other patient management decisions. A negative result may occur with  improper specimen collection/handling, submission of specimen other than nasopharyngeal swab, presence of viral mutation(s) within the areas targeted by this assay, and inadequate number of viral copies(<138 copies/mL). A negative result must be combined with clinical observations, patient history, and epidemiological information. The expected result is Negative.  Fact Sheet for  Patients:  EntrepreneurPulse.com.au  Fact Sheet for Healthcare Providers:  IncredibleEmployment.be  This test is no t yet approved or cleared by the [place] FDA and  has been authorized for detection and/or diagnosis of SARS-CoV-2 by FDA under an Emergency Use Authorization (EUA). This EUA will remain  in effect (meaning this test can be used) for the duration of the COVID-19 declaration under Section 564(b)(1) of the Act, 21 U.S.C.section 360bbb-3(b)(1), unless the authorization is terminated  or revoked sooner.       Influenza A by PCR NEGATIVE NEGATIVE Final   Influenza B by PCR NEGATIVE NEGATIVE Final    Comment: (NOTE) The Xpert Xpress SARS-CoV-2/FLU/RSV plus assay is intended as an aid in the diagnosis of influenza from Nasopharyngeal swab specimens and should not be used as a sole basis for treatment. Nasal washings and aspirates are unacceptable for Xpert Xpress SARS-CoV-2/FLU/RSV testing.  Fact Sheet for Patients: EntrepreneurPulse.com.au  Fact Sheet for Healthcare Providers: IncredibleEmployment.be  This test is not yet approved or cleared by the [place] FDA and has been authorized for detection and/or diagnosis of SARS-CoV-2 by FDA under an Emergency Use Authorization (EUA). This EUA will remain in effect (meaning this test can be used) for the duration of the COVID-19 declaration under Section 564(b)(1) of the Act, 21 U.S.C. section 360bbb-3(b)(1), unless the authorization is terminated or revoked.  Performed at [facility], [address] [place], [place] [address]   MRSA Next Gen by PCR, Nasal     Status: None   Collection Time: 09/01/20  9:23 PM   Specimen: Nasal Mucosa; Nasal Swab  Result Value Ref Range Status   MRSA by PCR Next Gen NOT DETECTED NOT DETECTED Final    Comment: (NOTE) The GeneXpert MRSA Assay (FDA approved for NASAL specimens only), is one  component of a comprehensive MRSA colonization surveillance program. It is not intended to diagnose MRSA infection nor to guide or monitor treatment for MRSA infections. Test performance is not FDA approved in patients less than 40 years old. Performed at [facility], [address] [place], [place] [address]   CULTURE, BLOOD (ROUTINE X 2) w Reflex to ID Panel     Status: None   Collection Time: 09/01/20  9:43 PM   Specimen: BLOOD  Result Value Ref Range Status   Specimen Description BLOOD RIGHT ASSIST CONTROL  Final   Special Requests   Final    BOTTLES DRAWN AEROBIC AND ANAEROBIC Blood Culture adequate volume   Culture  Final    NO GROWTH 5 DAYS Performed at [facility], [address] [place], [place] [address]    Report Status 09/06/2020 FINAL  Final  CULTURE, BLOOD (ROUTINE X 2) w Reflex to ID Panel     Status: None   Collection Time: 09/01/20  9:50 PM   Specimen: BLOOD  Result Value Ref Range Status   Specimen Description BLOOD RIGHT HAND  Final   Special Requests   Final    BOTTLES DRAWN AEROBIC ONLY Blood Culture results may not be optimal due to an inadequate volume of blood received in culture bottles   Culture   Final    NO GROWTH 5 DAYS Performed at [facility], [address], [place], [place] [address]    Report Status 09/06/2020 FINAL  Final  Culture, BAL-quantitative w Gram Stain     Status: None   Collection Time: 09/07/20 12:40 PM   Specimen: Bronchoalveolar Lavage; Respiratory  Result Value Ref Range Status   Specimen Description   Final    BRONCHIAL ALVEOLAR LAVAGE Performed at [facility], [address] [place], [place] [address]    Special Requests   Final    NONE Performed at [facility], [address] [place], [place] [address]    Gram Stain   Final    RARE WBC PRESENT, PREDOMINANTLY PMN FEW GRAM POSITIVE RODS RARE GRAM POSITIVE COCCI    Culture   Final    FEW Normal respiratory  flora-no Staph aureus or Pseudomonas seen Performed at [facility], [place] [address] [place], [place] [address]    Report Status 09/09/2020 FINAL  Final         Radiology Studies: No results found.      Scheduled Meds:  apixaban  5 mg Oral BID   arformoterol  15 mcg Nebulization BID   aspirin EC  81 mg Oral Daily   atorvastatin  40 mg Oral QHS   bisoprolol  5 mg Oral Daily   budesonide (PULMICORT) nebulizer solution  0.5 mg Nebulization BID   Chlorhexidine Gluconate Cloth  6 each Topical Daily   furosemide  40 mg Oral Daily   insulin aspart  0-9 Units Subcutaneous TID WC   ipratropium-albuterol  3 mL Nebulization Q6H   melatonin  5 mg Oral QHS   pantoprazole  40 mg Oral Daily   theophylline  300 mg Oral Q12H   Continuous Infusions:   LOS: 10 days    Time spent: 35 minutes    Edwin Dada, MD [facility] 09/11/2020, 9:09 AM     Please page though [place] or Epic secure chat:  For [employer], Adult nurse

## 2020-09-11 NOTE — Progress Notes (Signed)
Patient taken off trach collar and placed on home bipap  with a 2LPM oxygen bleed-in for the night.  Passy-Muir valve removed and trach capped while on home bipap.

## 2020-09-12 LAB — GLUCOSE, CAPILLARY
Glucose-Capillary: 133 mg/dL — ABNORMAL HIGH (ref 70–99)
Glucose-Capillary: 182 mg/dL — ABNORMAL HIGH (ref 70–99)
Glucose-Capillary: 166 mg/dL — ABNORMAL HIGH (ref 70–99)
Glucose-Capillary: 128 mg/dL — ABNORMAL HIGH (ref 70–99)

## 2020-09-12 MED ORDER — IPRATROPIUM-ALBUTEROL 0.5-2.5 (3) MG/3ML IN SOLN
3.0000 mL | Freq: Two times a day (BID) | RESPIRATORY_TRACT | Status: DC
  Administered 2020-09-12 – 2020-09-14 (×4): 3 mL via RESPIRATORY_TRACT
  Filled 2020-09-12 (×4): qty 3

## 2020-09-12 NOTE — Progress Notes (Signed)
[facility] PROGRESS NOTE    ASA BAUDOIN  TGY:[id number] DOB: [birth year] DOA: 09/01/2020 PCP: [facility] [facility]      Brief Narrative:  [name] is a 50 y.o. M with obesity, sCHF EF 45-50%, hx renal CA partial nephrectomy, CKD IIIa, COPD FEV1 26%pred with chronic tracheostomy, hx Sarcoidosis, hx OSA on BiPAP, hx stroke and hx pAF on Eliquis who prsented with dyspnea 1 week.  In the ER he was tachypneic to 30 breaths/min, tachycardic to 100, and due to persistent hypoxia in the ER he was placed on ventilator, started on steroids and bronchodilators.  Chest x-ray with bilateral opacities.    7/28: Admitted to ICU for acute on chronic hypercapnic and hypoxic respiratory failure, ventilated through his tracheostomy, treated with IV Lasix, bronchodilators 7/29-8/2: improving but ABG showing respiratory acidosis in the mornings 8/3: Bronchoscopy, pulled out mucous plugging, symptoms improved afterwards 8/4-8/8: Given persistent respiratory acidosis despite use of BiPAP, respiratory recommended noninvasive ventilation through tracheostomy at night for home use.  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  Diuresed 13 L, respiratory status returned to baseline  Now appears euvolemic - Continue home furosemide  Hypertension Coronary artery disease, secondary prevention Blood pressure controlled - Continue aspirin, atorvastatin, furosemide, bisoprolol  Paroxysmal atrial fibrillation -Continue bisoprolol and Eliquis  Diabetes Glucose controlled -  Continue sliding scale corrections  History renal cancer  CKD IIIa AKI ruled out            Disposition: Status is: Inpatient  Remains inpatient appropriate because:Unsafe d/c plan  Dispo: The patient is from: Home              Anticipated d/c is to: Home              Patient currently is not medically stable to d/c.   Difficult to place patient No   Patient was admitted with respiratory failure.  He was diuresed and this resolved.  After completion of treatment for his CHF and COPD exacerbation, the patient was noted to have persistent respiratory acidosis in the morning.  Pulmonology recommended noninvasive ventilation and holding the patient in the hospital until this can be procured.  Once this is procured, and the patient is trained on use we will obtain an ABG tomorrow morning if normal, discharge to home    Level of care: Med-Surg       MDM: The below labs and imaging reports were reviewed and summarized above.  Medication management as above.   DVT prophylaxis:  apixaban (ELIQUIS) tablet 5 mg  Code Status: Full code Family Communication:     Consultants:  Pulmonology  Procedures:  8/3 bronchoscopy: Lots of mucous plug             Subjective: No new chest discomfort, wheezing, respiratory distress, swelling, confusion, fever.  Objective: Vitals:   09/12/20 0126 09/12/20 0520 09/12/20 0940 09/12/20 1058  BP: 121/83 118/78 117/63   Pulse: 62 68 72 76  Resp: _0 Temp:  98.2 F (36.8 C) 98.2 F (36.8 C)   TempSrc:  Oral Oral   SpO2: 100%  99% 97% 98%  Weight:      Height:        Intake/Output Summary (Last 24 hours) at 09/12/2020 1252 Last data filed at 09/12/2020 1215 Gross per 24 hour  Intake 1240 ml  Output 2650 ml  Net -1410 ml   Filed Weights   09/08/20 0419 09/09/20 0353 09/10/20 0500  Weight: 122.7 kg 122.6 kg 123.6 kg    Examination: General appearance: Adult male, sitting up in bed, watching television      HEENT: Anicteric, conjunctive pink, lids and lashes normal.  Tracheostomy in place, clean without discharge. Skin:  Cardiac: RRR, no murmurs, no lower extremity edema, no JVD Respiratory: Respiratory rate and rhythm, lungs clear without rales or wheezes Abdomen:   MSK:  Neuro: Awake and alert, extraocular movements intact, moves all extremities with normal strength and coordination, speech Psych: Sensorium intact and was responding to questions, attention normal, affect normal          Data Reviewed: I have personally reviewed following labs and imaging studies:  CBC: Recent Labs  Lab 09/06/20 0350 09/07/20 0445 09/10/20 0633  WBC 6.7 5.3 5.7  HGB 13.3 13.0 13.2  HCT 43.0 40.8 40.7  MCV 87.9 88.9 88.5  PLT 204 192 937   Basic Metabolic Panel: Recent Labs  Lab 09/06/20 0350 09/07/20 0445 09/08/20 0518 09/09/20 0606 09/11/20 0440  NA 139 140 144 139 138  K 4.0 3.9 4.5 4.3 4.2  CL 97* 100 102 102 100  CO2 37* 34* 35* 33* 34*  GLUCOSE 146* 138* 121* 170* 163*  BUN 24* 24* 25* 23* 20  CREATININE 1.48* 1.59* 1.58* 1.58* 1.40*  CALCIUM 8.7* 8.7* 8.7* 8.2* 8.2*  MG 2.2 2.1 2.0 2.0  --   PHOS 2.7 2.9 3.6  --   --    GFR: Estimated Creatinine Clearance: 87 mL/min (A) (by C-G formula based on SCr of 1.4 mg/dL (H)). Liver Function Tests: No results for input(s): AST, ALT, ALKPHOS, BILITOT, PROT, ALBUMIN in the last 168 hours. No results for input(s): LIPASE, AMYLASE in the last 168 hours. No results for input(s): AMMONIA in the last 168 hours. Coagulation Profile: No results for input(s): INR, PROTIME in the last 168 hours. Cardiac Enzymes: No results for input(s): CKTOTAL, CKMB, CKMBINDEX, TROPONINI in the last 168 hours. BNP (last 3 results) No results for input(s): PROBNP in the last 8760 hours. HbA1C: No results for input(s): HGBA1C in the last 72 hours. CBG: Recent Labs  Lab 09/11/20 1148 09/11/20 1550 09/11/20 2059 09/12/20 0734 09/12/20 1113   GLUCAP 173* 131* 135* 133* 182*   Lipid Profile: No results for input(s): CHOL, HDL, LDLCALC, TRIG, CHOLHDL, LDLDIRECT in the last 72 hours. Thyroid Function Tests: No results for input(s): TSH, T4TOTAL, FREET4, T3FREE, THYROIDAB in the last 72 hours. Anemia Panel: No results for input(s): VITAMINB12, FOLATE, FERRITIN, TIBC, IRON, RETICCTPCT in the last 72 hours. Urine analysis:    Component Value Date/Time   COLORURINE YELLOW 01/27/2020 1000   APPEARANCEUR CLEAR 01/27/2020 1000   APPEARANCEUR Hazy 06/01/2013 1139   LABSPEC 1.018 01/27/2020 1000   LABSPEC 1.030 06/01/2013 1139   PHURINE 5.0 01/27/2020 1000   GLUCOSEU NEGATIVE 01/27/2020 1000   GLUCOSEU Negative 06/01/2013 1139   HGBUR NEGATIVE 01/27/2020 1000   BILIRUBINUR NEGATIVE 01/27/2020 1000   BILIRUBINUR Negative 06/01/2013 1139   KETONESUR NEGATIVE 01/27/2020 1000   PROTEINUR 30 (A) 01/27/2020 1000   NITRITE NEGATIVE 01/27/2020 1000   LEUKOCYTESUR NEGATIVE 01/27/2020 1000   LEUKOCYTESUR Negative  06/01/2013 1139   Sepsis Labs: _0 (procalcitonin:4,lacticacidven:4)  ) Recent Results (from the past 240 hour(s))  Culture, BAL-quantitative w Gram Stain     Status: None   Collection Time: 09/07/20 12:40 PM   Specimen: Bronchoalveolar Lavage; Respiratory  Result Value Ref Range Status   Specimen Description   Final    BRONCHIAL ALVEOLAR LAVAGE Performed at [facility], [address], [place], [place] [address]    Special Requests   Final    NONE Performed at [facility], [address] [place], [place] [address]    Gram Stain   Final    RARE WBC PRESENT, PREDOMINANTLY PMN FEW GRAM POSITIVE RODS RARE GRAM POSITIVE COCCI    Culture   Final    FEW Normal respiratory flora-no Staph aureus or Pseudomonas seen Performed at [facility], [place] [address], [place], [place] [address]    Report Status 09/09/2020 FINAL  Final         Radiology Studies: No results  found.      Scheduled Meds:  apixaban  5 mg Oral BID   arformoterol  15 mcg Nebulization BID   aspirin EC  81 mg Oral Daily   atorvastatin  40 mg Oral QHS   bisoprolol  5 mg Oral Daily   budesonide (PULMICORT) nebulizer solution  0.5 mg Nebulization BID   Chlorhexidine Gluconate Cloth  6 each Topical Daily   furosemide  40 mg Oral Daily   insulin aspart  0-9 Units Subcutaneous TID WC   ipratropium-albuterol  3 mL Nebulization BID   melatonin  5 mg Oral QHS   pantoprazole  40 mg Oral Daily   theophylline  300 mg Oral Q12H   Continuous Infusions:   LOS: 11 days    Time spent: 35 minutes    Edwin Dada, MD [facility] 09/12/2020, 12:52 PM     Please page though [place] or Epic secure chat:  For [employer], Adult nurse

## 2020-09-12 NOTE — Progress Notes (Signed)
Patient with orders for Bipap QHS. Patient's trach was capped and patient fitted with full face bipap mask. Patient was placed on home bipap unit with 2 lpm O2 bleed in. Tolerated well. SpO2 98%. Will continue to monitor.

## 2020-09-12 NOTE — TOC Progression Note (Signed)
Transition of Care [facility]) - Progression Note    Patient Details  Name: [name] MRN: [id number] Date of Birth: [birth year]  Transition of Care [facility]) CM/SW [place], [place] Phone Number: [phone] 09/12/2020, 4:38 PM  Clinical Narrative:     CSW spoke with Thedore Mins from [place]. Patient's spouse [name],[name] (Spouse)  [phone] (Mobile) is currently receiving home education from [place] w/ [employer].  Then they will schedule the overnight and the patient will be ready to return home. CSW updated Zach with 09/10/2020 note which stated the patient's spouse did not want to do the overnight education, and he stated they will address that with her.       Expected Discharge Plan and Services                                                 Social Determinants of Health (SDOH) Interventions    Readmission Risk Interventions Readmission Risk Prevention Plan 11/19/2018 07/30/2018 07/29/2018  Transportation Screening Complete Complete Complete  PCP or Specialist Appt within 3-5 Days - Complete Complete  HRI or [place] - Complete Complete  Social Work Consult for [place] Planning/Counseling - - Patient refused  Palliative Care Screening - Not Applicable Not Applicable  Medication Review Press photographer) Referral to Pharmacy Complete Complete  PCP or Specialist appointment within 3-5 days of discharge Complete - -  [place] or Home Care Consult Patient refused - -  SW Recovery Care/Counseling Consult Complete - -  Palliative Care Screening Not Applicable - -  [place] Not Applicable - -  Some recent data might be hidden

## 2020-09-12 NOTE — Progress Notes (Signed)
Occupational Therapy Treatment Patient Details Name: [name] MRN: [id number] DOB: 01-24-1971 Today's Date: 09/12/2020    History of present illness [name] is a [name] comes to [facility] on 08/31/20 c SOB. Pt admitted with hypercapnic resp failuer 2/2 COPD exacerbation. PMH: CHF (combined), COPD, CAD, MI, DM2, HLD, HTN, sarcoidosis, OSA, CVA, CRF  c tracheostomy on chronic 2L at home, current tobacco and polysubstance abuse.   OT comments  Upon entering the room, pt seated on EOB with RN present in room. Pt is agreeable to OT intervention this session and reports he has been doing well. He is wearing PMSV without issue during session. Pt reports having performed self care tasks standing at sink in room this morning and "it went good". Discussed home set up again and pt feels he has all the equipment needed at discharge. Pt demonstrated B UE HEP with use of red resistive theraband independently. Pt remained seated on EOB at end of session with all needs within reach. Pt continues to benefit from OT intervention.    Follow Up Recommendations  Home health OT    Equipment Recommendations  None recommended by OT       Precautions / Restrictions Precautions Precautions: None       Mobility Bed Mobility Overal bed mobility: Independent                  Transfers Overall transfer level: Independent Equipment used: None                  Balance Overall balance assessment: Independent Sitting-balance support: Feet supported Sitting balance-Leahy Scale: Good Sitting balance - Comments: G static and dynamic sitting                                   ADL either performed or assessed with clinical judgement                       Cognition Arousal/Alertness: Awake/alert Behavior During Therapy: WFL for tasks assessed/performed Overall Cognitive Status: Within Functional Limits for tasks assessed                                  General Comments: Pt tolerates PMSV during entire session without issue                   Pertinent Vitals/ Pain       Pain Assessment: No/denies pain   Frequency  Min 2X/week        Progress Toward Goals  OT Goals(current goals can now be found in the care plan section)  Progress towards OT goals: Progressing toward goals  Acute Rehab OT Goals Patient Stated Goal: to get stronger and go home OT Goal Formulation: With patient Time For Goal Achievement: 09/16/20 Potential to Achieve Goals: Good  Plan Discharge plan remains appropriate;Frequency remains appropriate       AM-PAC OT "6 Clicks" Daily Activity     Outcome Measure   Help from another person eating meals?: None Help from another person taking care of personal grooming?: None Help from another person toileting, which includes using toliet, bedpan, or urinal?: None Help from another person bathing (including washing, rinsing, drying)?: A Little Help from another person to put on and taking off regular upper body clothing?: None Help from another person to  put on and taking off regular lower body clothing?: A Little 6 Click Score: 22    End of Session Equipment Utilized During Treatment: Other (comment) (trach, PMSV)  OT Visit Diagnosis: Unsteadiness on feet (R26.81);Muscle weakness (generalized) (M62.81)   Activity Tolerance Patient tolerated treatment well   Patient Left with call bell/phone within reach;in bed   Nurse Communication          Time: [id number] OT Time Calculation (min): 13 min  Charges: OT General Charges $OT Visit: 1 Visit OT Treatments $Therapeutic Exercise: 8-22 mins  Darleen Crocker, MS, OTR/L , CBIS ascom [phone]  09/12/20, 4:24 PM

## 2020-09-13 ENCOUNTER — Telehealth: Payer: Self-pay | Admitting: Gastroenterology

## 2020-09-13 LAB — GLUCOSE, CAPILLARY
Glucose-Capillary: 148 mg/dL — ABNORMAL HIGH (ref 70–99)
Glucose-Capillary: 170 mg/dL — ABNORMAL HIGH (ref 70–99)
Glucose-Capillary: 125 mg/dL — ABNORMAL HIGH (ref 70–99)

## 2020-09-13 NOTE — Telephone Encounter (Signed)
Patient has to cancel his procedure w/ Dr. Vicente Males on 09/27/20 due to being in the hospital and having complications.

## 2020-09-13 NOTE — Progress Notes (Addendum)
Occupational Therapy Treatment Patient Details Name: [name] MRN: [id number] DOB: 1971/01/06 Today's Date: 09/13/2020    History of present illness [name] is a [name] comes to [facility] on 08/31/20 c SOB. Pt admitted with hypercapnic resp failuer 2/2 COPD exacerbation. PMH: CHF (combined), COPD, CAD, MI, DM2, HLD, HTN, sarcoidosis, OSA, CVA, CRF  c tracheostomy on chronic 2L at home, current tobacco and polysubstance abuse.   OT comments  [name] was seen for OT follow-up regarding safety and functional independence with ADL management upon DC home this date. Pt received semi-supine in bed, denies pain this date. Pt voices satisfaction with progress since initial admission. OT facilitates pt education on discharge expectations as pt no longer requires HHOT, falls prevention strategies, and routines modifications to support safety and functional independence upon hospital DC. Pt return verbalizes understanding of education provided. Voices strong confidence in functional abilities upon DC home. Pt making good progress toward OT goals and continues to benefit from OT services. DC recommendation updated to reflect pt progress. Do not anticipate the need for OT follow up upon discharge. POC updated.      Follow Up Recommendations  No OT follow up    Equipment Recommendations  None recommended by OT    Recommendations for Other Services      Precautions / Restrictions Precautions Precautions: Fall Precaution Comments: Low Fall Restrictions Weight Bearing Restrictions: No       Mobility Bed Mobility Overal bed mobility: Independent                  Transfers Overall transfer level: Independent Equipment used: None Transfers: Sit to/from Stand Sit to Stand: Modified independent (Device/Increase time)              Balance Overall balance assessment: Independent   Sitting balance-Leahy Scale: Good Sitting balance - Comments: G static and dynamic sitting                                    ADL either performed or assessed with clinical judgement   ADL Overall ADL's : Needs assistance/impaired                                       General ADL Comments: Pt MOD I for ADL management, able to perform LB ADL without AE. Has progressed well with OT goals during admission.     Vision Patient Visual Report: No change from baseline     Perception     Praxis      Cognition Arousal/Alertness: Awake/alert Behavior During Therapy: WFL for tasks assessed/performed Overall Cognitive Status: Within Functional Limits for tasks assessed                                 General Comments: pt is A and O x 4        Exercises Other Exercises Other Exercises: OT facilitates pt education on discharge expectations as pt no longer requires HHOT, falls prevention strategies, and routines modifications to support safety and functional independence upon hospital DC. Pt return verbalizes understanding of education provided. Voices strong confidence in functional abilities upon DC home.   Shoulder Instructions       General Comments      Pertinent Vitals/ Pain  Pain Assessment: No/denies pain  Home Living                                          Prior Functioning/Environment              Frequency  Min 2X/week        Progress Toward Goals  OT Goals(current goals can now be found in the care plan section)  Progress towards OT goals: Progressing toward goals  Acute Rehab OT Goals Patient Stated Goal: go home tomorrow OT Goal Formulation: With patient Time For Goal Achievement: 09/16/20 Potential to Achieve Goals: Good  Plan Discharge plan needs to be updated;Frequency remains appropriate    Co-evaluation                 AM-PAC OT "6 Clicks" Daily Activity     Outcome Measure   Help from another person eating meals?: None Help from another person taking  care of personal grooming?: None Help from another person toileting, which includes using toliet, bedpan, or urinal?: None Help from another person bathing (including washing, rinsing, drying)?: A Little Help from another person to put on and taking off regular upper body clothing?: None Help from another person to put on and taking off regular lower body clothing?: A Little 6 Click Score: 22    End of Session Equipment Utilized During Treatment: Oxygen (Trach, PMV)  OT Visit Diagnosis: Unsteadiness on feet (R26.81);Muscle weakness (generalized) (M62.81)   Activity Tolerance Patient tolerated treatment well   Patient Left with call bell/phone within reach;in bed   Nurse Communication          Time: [id number] OT Time Calculation (min): 8 min  Charges: OT General Charges $OT Visit: 1 Visit OT Treatments $Self Care/Home Management : 8-22 mins  [name], M.S., OTR/L Ascom: [phone] 09/13/20, 2:36 PM

## 2020-09-13 NOTE — Progress Notes (Signed)
Physical Therapy Discharge Patient Details Name: [name] MRN: [id number] DOB: 02/11/1970 Today's Date: 09/13/2020 Time: 9480-1655 PT Time Calculation (min) (ACUTE ONLY): 8 min  Patient discharged from PT services secondary to goals met and no further PT needs identified.  Please see latest therapy progress note for current level of functioning and progress toward goals.    Progress and discharge plan discussed with patient and/or caregiver: Patient/Caregiver agrees with plan       Willette Pa 09/13/2020, 11:06 AM

## 2020-09-13 NOTE — Progress Notes (Signed)
[facility] PROGRESS NOTE    [name]  TDH:[id number] DOB: 1970-05-08 DOA: 09/01/2020 PCP: [place]      Brief Narrative:  [name] is a 50 y.o. M with obesity, sCHF EF 45-50%, hx renal CA partial nephrectomy, CKD IIIa, COPD FEV1 26%pred with chronic tracheostomy, hx Sarcoidosis, hx OSA on BiPAP, hx stroke and hx pAF on Eliquis who prsented with dyspnea 1 week.  In the ER he was tachypneic to 30 breaths/min, tachycardic to 100, and due to persistent hypoxia in the ER he was placed on ventilator, started on steroids and bronchodilators.  Chest x-ray with bilateral opacities.  Patient admitted with bilateral opacities on chest x-ray, dyspnea.       7/28: Admitted to ICU for acute on chronic hypercapnic and hypoxic respiratory failure, ventilated through his tracheostomy, treated with IV Lasix, bronchodilators 7/29-8/2: improving but ABG showing respiratory acidosis in the mornings 8/3: Bronchoscopy performed, cleared a lot of mucous plugging, afterwards, patient symptoms improved  8/4-8/8: Given persistent morning respiratory acidosis despite use of home BiPAP, Pulmonology recommended noninvasive ventilation through tracheostomy at night for home use.  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 Sarcoidosis OSA Obesity, BMI 36 - Continue Pulmicort, LAMA, PPI, theophylline - Continue SABA/SAMA   Acute on chronic systolic CHF Diuresed 64W on admission, now euvolemic  - Continue furosemide  Hypertension Coronary artery disease, secondary prevention Blood pressure normal - Continue aspirin, atorvastatin, furosemide, bisoprolol  Paroxysmal atrial fibrillation - Continue bisporpol and Eliquis  Diabetes Glucose normal - Continue sliding scale  corrections  History renal cancer  CKD IIIa AKI ruled out            Disposition: Status is: Inpatient  Remains inpatient appropriate because:Unsafe d/c plan  Dispo: The patient is from: Home              Anticipated d/c is to: Home              Patient currently is not medically stable to d/c.   Difficult to place patient No   Patient was admitted with respiratory failure.  He was diuresed and this resolved.    [employer] have delivered device.  Will trial use overnight tonight, with patient and wife.    If Abg has normal Ph tomorrow, will discharge home.      Level of care: Med-Surg       MDM: The below labs and imaging reports were reviewed and summarized above.  Medication management as above.   DVT prophylaxis:  apixaban (ELIQUIS) tablet 5 mg  Code Status: Full code Family Communication:     Consultants:  Pulmonology  Procedures:  8/3 bronchoscopy: Lots of mucous plug             Subjective: No new complaints.  No fever. No new dyspnea.  Objective: Vitals:   09/13/20 0500 09/13/20 0612 09/13/20 0700 09/13/20 0800  BP: 104/69     Pulse: 67   66  Resp:      Temp: 98.5 F (36.9 C)   98.7 F (37.1 C)  TempSrc: Oral     SpO2: 98% 99% 99% 97%  Weight: 125.9 kg     Height:        Intake/Output Summary (Last 24 hours) at 09/13/2020 1401 Last  data filed at 09/13/2020 0900 Gross per 24 hour  Intake 698 ml  Output 1450 ml  Net -752 ml   Filed Weights   09/09/20 0353 09/10/20 0500 09/13/20 0500  Weight: 122.6 kg 123.6 kg 125.9 kg    Examination: General appearance: Adult male, lying in bed, no acute distress  HEENT:   Skin:  Cardiac: RRR, no murmurs, no lower extremity edema, no JVD Respiratory: Respiratory rate and rhythm, lungs clear without rales or wheezes Abdomen:   MSK:  Neuro:   Psych:          Data Reviewed: I have personally reviewed following labs and imaging studies:  CBC: Recent Labs  Lab  Oct 06, 2020 0445 09/10/20 0633  WBC 5.3 5.7  HGB 13.0 13.2  HCT 40.8 40.7  MCV 88.9 88.5  PLT 192 086   Basic Metabolic Panel: Recent Labs  Lab 10-06-2020 0445 09/08/20 0518 09/09/20 0606 09/11/20 0440  NA 140 144 139 138  K 3.9 4.5 4.3 4.2  CL 100 102 102 100  CO2 34* 35* 33* 34*  GLUCOSE 138* 121* 170* 163*  BUN 24* 25* 23* 20  CREATININE 1.59* 1.58* 1.58* 1.40*  CALCIUM 8.7* 8.7* 8.2* 8.2*  MG 2.1 2.0 2.0  --   PHOS 2.9 3.6  --   --    GFR: Estimated Creatinine Clearance: 87.8 mL/min (A) (by C-G formula based on SCr of 1.4 mg/dL (H)). Liver Function Tests: No results for input(s): AST, ALT, ALKPHOS, BILITOT, PROT, ALBUMIN in the last 168 hours. No results for input(s): LIPASE, AMYLASE in the last 168 hours. No results for input(s): AMMONIA in the last 168 hours. Coagulation Profile: No results for input(s): INR, PROTIME in the last 168 hours. Cardiac Enzymes: No results for input(s): CKTOTAL, CKMB, CKMBINDEX, TROPONINI in the last 168 hours. BNP (last 3 results) No results for input(s): PROBNP in the last 8760 hours. HbA1C: No results for input(s): HGBA1C in the last 72 hours. CBG: Recent Labs  Lab 09/12/20 1113 09/12/20 1646 09/12/20 2134 09/13/20 0806 09/13/20 1108  GLUCAP 182* 166* 128* 148* 170*   Lipid Profile: No results for input(s): CHOL, HDL, LDLCALC, TRIG, CHOLHDL, LDLDIRECT in the last 72 hours. Thyroid Function Tests: No results for input(s): TSH, T4TOTAL, FREET4, T3FREE, THYROIDAB in the last 72 hours. Anemia Panel: No results for input(s): VITAMINB12, FOLATE, FERRITIN, TIBC, IRON, RETICCTPCT in the last 72 hours. Urine analysis:    Component Value Date/Time   COLORURINE YELLOW 01/27/2020 1000   APPEARANCEUR CLEAR 01/27/2020 1000   APPEARANCEUR Hazy 06/01/2013 1139   LABSPEC 1.018 01/27/2020 1000   LABSPEC 1.030 06/01/2013 1139   PHURINE 5.0 01/27/2020 1000   GLUCOSEU NEGATIVE 01/27/2020 1000   GLUCOSEU Negative 06/01/2013 1139   HGBUR  NEGATIVE 01/27/2020 1000   BILIRUBINUR NEGATIVE 01/27/2020 1000   BILIRUBINUR Negative 06/01/2013 1139   KETONESUR NEGATIVE 01/27/2020 1000   PROTEINUR 30 (A) 01/27/2020 1000   NITRITE NEGATIVE 01/27/2020 1000   LEUKOCYTESUR NEGATIVE 01/27/2020 1000   LEUKOCYTESUR Negative 06/01/2013 1139   Sepsis Labs: _0 (procalcitonin:4,lacticacidven:4)  ) Recent Results (from the past 240 hour(s))  Culture, BAL-quantitative w Gram Stain     Status: None   Collection Time: 10/06/20 12:40 PM   Specimen: Bronchoalveolar Lavage; Respiratory  Result Value Ref Range Status   Specimen Description   Final    BRONCHIAL ALVEOLAR LAVAGE Performed at [facility], [address] [place],  [address]    Special Requests   Final    NONE Performed  at [facility], [address] [place], [place] [address]    Gram Stain   Final    RARE WBC PRESENT, PREDOMINANTLY PMN FEW GRAM POSITIVE RODS RARE GRAM POSITIVE COCCI    Culture   Final    FEW Normal respiratory flora-no Staph aureus or Pseudomonas seen Performed at [facility], [address] [place], [place] [address]    Report Status 09/09/2020 FINAL  Final         Radiology Studies: No results found.      Scheduled Meds:  apixaban  5 mg Oral BID   arformoterol  15 mcg Nebulization BID   aspirin EC  81 mg Oral Daily   atorvastatin  40 mg Oral QHS   bisoprolol  5 mg Oral Daily   budesonide (PULMICORT) nebulizer solution  0.5 mg Nebulization BID   Chlorhexidine Gluconate Cloth  6 each Topical Daily   furosemide  40 mg Oral Daily   insulin aspart  0-9 Units Subcutaneous TID WC   ipratropium-albuterol  3 mL Nebulization BID   melatonin  5 mg Oral QHS   pantoprazole  40 mg Oral Daily   theophylline  300 mg Oral Q12H   Continuous Infusions:   LOS: 12 days    Time spent: 15 minutes    Edwin Dada, MD [facility] 09/13/2020, 2:01 PM     Please page though [place] or Epic  secure chat:  For [employer], Adult nurse

## 2020-09-13 NOTE — Progress Notes (Signed)
Patient placed on Servo-I ventilator for the night, cuff inflated. Settings are as follows:  PRVC, RR-15, 500, +5, 40%  Patient appears to be tolerating well.  Will continue to follow.

## 2020-09-14 ENCOUNTER — Ambulatory Visit: Payer: Medicare HMO | Admitting: Family

## 2020-09-14 LAB — BLOOD GAS, ARTERIAL
Acid-Base Excess: 13.2 mmol/L — ABNORMAL HIGH (ref 0.0–2.0)
Bicarbonate: 40.6 mmol/L — ABNORMAL HIGH (ref 20.0–28.0)
FIO2: 0.28
O2 Saturation: 96.1 %
Patient temperature: 37
pCO2 arterial: 64 mmHg — ABNORMAL HIGH (ref 32.0–48.0)
pH, Arterial: 7.41 (ref 7.350–7.450)
pO2, Arterial: 82 mmHg — ABNORMAL LOW (ref 83.0–108.0)

## 2020-09-14 LAB — GLUCOSE, CAPILLARY
Glucose-Capillary: 115 mg/dL — ABNORMAL HIGH (ref 70–99)
Glucose-Capillary: 138 mg/dL — ABNORMAL HIGH (ref 70–99)
Glucose-Capillary: 209 mg/dL — ABNORMAL HIGH (ref 70–99)

## 2020-09-14 MED ORDER — BISOPROLOL FUMARATE 5 MG PO TABS: 5.0000 mg | ORAL_TABLET | Freq: Every day | ORAL | 1 refills | Status: DC

## 2020-09-14 MED ORDER — FUROSEMIDE 40 MG PO TABS: 40.0000 mg | ORAL_TABLET | Freq: Every day | ORAL | 1 refills | Status: AC

## 2020-09-14 MED ORDER — PANTOPRAZOLE SODIUM 40 MG PO TBEC: 40.0000 mg | DELAYED_RELEASE_TABLET | Freq: Every day | ORAL | 1 refills | Status: DC

## 2020-09-14 NOTE — TOC Transition Note (Signed)
Transition of Care [facility]) - CM/SW Discharge Note   Patient Details  Name: [name] MRN: [id number] Date of Birth: 02/23/1970  Transition of Care [facility]) CM/SW Contact:  Ova Freshwater Phone Number: [phone] 09/14/2020, 12:40 PM   Clinical Narrative:      Patient will d/c home with home ventilator supplied by [facility]. Patient's spouse [name],[name] (Spouse) [phone] (Mobile) will pick up the patient at 1:00PM.  CSW updated Zach w/ [employer] on pending discharge.  Attending and ICU RN are updated.       Patient Goals and CMS Choice        Discharge Placement                       Discharge Plan and Services                                     Social Determinants of Health (SDOH) Interventions     Readmission Risk Interventions Readmission Risk Prevention Plan 11/19/2018 07/30/2018 07/29/2018  Transportation Screening Complete Complete Complete  PCP or Specialist Appt within 3-5 Days - Complete Complete  HRI or [place] - Complete Complete  Social Work Consult for [place] Planning/Counseling - - Patient refused  Palliative Care Screening - Not Applicable Not Applicable  Medication Review Press photographer) Referral to Pharmacy Complete Complete  PCP or Specialist appointment within 3-5 days of discharge Complete - -  [place] or Home Care Consult Patient refused - -  SW Recovery Care/Counseling Consult Complete - -  Palliative Care Screening Not Applicable - -  [place] Not Applicable - -  Some recent data might be hidden

## 2020-09-14 NOTE — Telephone Encounter (Signed)
Called the endoscopy unit to let them know that the patient wants to cancel his procedures.

## 2020-09-14 NOTE — Progress Notes (Signed)
Physical Therapy Treatment Patient Details Name: [name] MRN: [id number] DOB: 1970-10-28 Today's Date: 09/14/2020    History of Present Illness CHUKWUKA DWORAK is a [name] comes [facility] on 08/[facility] c SOB. Pt admitted with hypercapnic resp failuer 2/2 COPD exacerbation. PMH: CHF (combined), COPD, CAD, MI, DM2, HLD, HTN, sarcoidosis, OSA, CVA, CRF  c tracheostomy on chronic 2L at home, current tobacco and polysubstance abuse.    PT Comments    Pt was sitting EOB independently upon arriving. He greets Chief Strategy Officer and is cooperative throughout. Pt was on trach collar throughout session and was easily able to convert himself from wall O2 to portable O2 tank. Pt endorses having O2 set up at home already. He safely was able to stand and ambulate without physical assistance or LOB( NO AD). Did also demonstrate safe abilities to ambulate up/down stairs to simulate home environment. All acute PT goals have been reached. Does not have post acute PT needs. Will sign off.    Follow Up Recommendations  No PT follow up     Equipment Recommendations  None recommended by PT       Precautions / Restrictions Precautions Precautions: None    Mobility  Bed Mobility Overal bed mobility: Independent    Transfers Overall transfer level: Independent Equipment used: None     Ambulation/Gait Ambulation/Gait assistance: Independent   Assistive device: None Gait Pattern/deviations: WFL(Within Functional Limits) Gait velocity: WNL   General Gait Details: sao2 > 92% throughout on 4L mobile vent collar   Stairs   Stairs assistance: Modified independent (Device/Increase time) Stair Management: One rail Right;Alternating pattern   General stair comments: easily able to ascend/descend stairs without assistance or VCs    Balance Overall balance assessment: Independent      Cognition Arousal/Alertness: Awake/alert Behavior During Therapy: WFL for tasks assessed/performed Overall Cognitive  Status: Within Functional Limits for tasks assessed      General Comments: pt is A and O x 4        PT Goals (current goals can now be found in the care plan section) Acute Rehab PT Goals Patient Stated Goal: go home tomorrow    Frequency    Other (Comment) (DC from PT services. pt is independent with all mobility, transfers, and gait activities)      PT Plan Current plan remains appropriate;Frequency needs to be updated       AM-PAC PT "6 Clicks" Mobility   Outcome Measure  Help needed turning from your back to your side while in a flat bed without using bedrails?: None Help needed moving from lying on your back to sitting on the side of a flat bed without using bedrails?: None Help needed moving to and from a bed to a chair (including a wheelchair)?: None Help needed standing up from a chair using your arms (e.g., wheelchair or bedside chair)?: None Help needed to walk in hospital room?: None Help needed climbing 3-5 steps with a railing? : None 6 Click Score: 24    End of Session Equipment Utilized During Treatment: Oxygen (has home o2 prior to admission) Activity Tolerance: Patient tolerated treatment well Patient left: in bed;with call bell/phone within reach Nurse Communication: Mobility status PT Visit Diagnosis: Other abnormalities of gait and mobility (R26.89);Difficulty in walking, not elsewhere classified (R26.2)     Time:  -     Charges:                        Ovid Curd  [name] [name] PTA 09/14/20, 8:02 AM

## 2020-09-14 NOTE — Discharge Summary (Signed)
Physician Discharge Summary  [name] [id number] DOB: November 23, 1970 DOA: 09/01/2020  PCP: [place] date: 09/01/2020 Discharge date: 09/14/2020  Admitted From: Home Disposition: Home  Recommendations for Outpatient Follow-up:  Follow up with PCP in 1-2 weeks Patient will also need ABG to monitor hypercapnia Follow-up with pulmonologist in 1 to 2 weeks Please obtain BMP/CBC in one week Please follow up on the following pending results: None  Home Health: Yes Equipment/Devices: Noninvasive ventilation support/BiPAP, home oxygen, tracheostomy Discharge Condition: Stable CODE STATUS: Full Diet recommendation: Heart Healthy / Carb Modified   Brief/Interim Summary: [name] is a 50 y.o. M with obesity, sCHF EF 45-50%, hx renal CA partial nephrectomy, CKD IIIa, COPD FEV1 26%pred with chronic tracheostomy, hx Sarcoidosis, hx OSA on BiPAP, hx stroke and hx pAF on Eliquis who prsented with dyspnea 1 week.   In the ER he was tachypneic to 30 breaths/min, tachycardic to 100, and due to persistent hypoxia in the ER he was placed on ventilator, started on steroids and bronchodilators.  Chest x-ray with bilateral opacities.  Admitted for acute on chronic hypercapnic and hypoxic respiratory failure initially requiring ventilator through his tracheostomy tube.  He was also diuresed with IV Lasix.  On 09/07/2020 bronchoscopy was performed and it cleared a lot of mucus plugging, symptoms improved since then.  Patient continued to have morning respiratory acidosis despite using home BiPAP, pulmonology recommended noninvasive ventilation through tracheostomy at night for home use.  That required a new device and it took a while to get that new to device from his home health agency.  Device was delivered at bedside before discharge.  Patient will need monitoring of his hypercapnia and required to use the device at night and while resting.  Patient received IV Lasix for acute on  chronic systolic heart failure.  Diuresed well.  Now euvolemic.  Home dose of Lasix was decreased from 80 mg to 40 mg daily and patient remained stable.  He remained normotensive only on furosemide and bisoprolol.  Home dose of lisinopril and metoprolol was discontinued.  Patient has an history of paroxysmal atrial fibrillation remained rate well controlled and no concerns on bisoprolol and Eliquis.  Which he will continue.  Patient will continue with his respiratory therapy and follow-up with his pulmonologist for further recommendations.  He will continue with rest of his home medications and follow-up with his providers.  Discharge Diagnoses:  Active Problems:   Chronic systolic CHF (congestive heart failure) (HCC)   Type 2 diabetes mellitus with hyperlipidemia (HCC)   Acute kidney injury superimposed on CKD (HCC)   Obstructive sleep apnea syndrome   AF (paroxysmal atrial fibrillation) (HCC)   Acute on chronic respiratory failure with hypoxemia (HCC)   Obesity hypoventilation syndrome (HCC)   Obesity (BMI 35.0-39.9 without comorbidity)   Discharge Instructions  Discharge Instructions     Diet - low sodium heart healthy   Complete by: As directed    Discharge instructions   Complete by: As directed    It was pleasure taking care of you. We made some changes to your blood pressure medications, stop taking your lisinopril and metoprolol and continue with bisoprolol. We also decrease the dose of Lasix from 79m to 40 mg daily. Continue using your BiPAP at night and while resting. Follow-up with your primary care provider and pulmonologist for further recommendations.   Increase activity slowly   Complete by: As directed       Allergies as of 09/14/2020  Reactions   Penicillins Anaphylaxis, Other (See Comments)   Has patient had a PCN reaction causing immediate rash, facial/tongue/throat swelling, SOB or lightheadedness with hypotension: Yes Has patient had a PCN  reaction causing severe rash involving mucus membranes or skin necrosis: No Has patient had a PCN reaction that required hospitalization: No Has patient had a PCN reaction occurring within the last 10 years: No If all of the above answers are "NO", then may proceed with Cephalosporin use.        Medication List     STOP taking these medications    lisinopril 30 MG tablet Commonly known as: ZESTRIL   metoprolol tartrate 100 MG tablet Commonly known as: LOPRESSOR       TAKE these medications    aspirin EC 81 MG tablet Take 81 mg by mouth daily. Swallow whole.   atorvastatin 40 MG tablet Commonly known as: LIPITOR Take 40 mg by mouth at bedtime.   bisoprolol 5 MG tablet Commonly known as: ZEBETA Take 1 tablet (5 mg total) by mouth daily. Start taking on: September 15, 2020   Eliquis 5 MG Tabs tablet Generic drug: apixaban Take 5 mg by mouth 2 (two) times daily.   fluticasone-salmeterol 250-50 MCG/ACT Aepb Commonly known as: ADVAIR Inhale 1 puff into the lungs every 12 (twelve) hours.   furosemide 40 MG tablet Commonly known as: LASIX Take 1 tablet (40 mg total) by mouth daily. Start taking on: September 15, 2020 What changed: how much to take   ipratropium-albuterol 0.5-2.5 (3) MG/3ML Soln Commonly known as: DUONEB Take 3 mLs by nebulization every 4 (four) hours.   melatonin 5 MG Tabs Take 5 mg by mouth at bedtime.   NovoLOG FlexPen 100 UNIT/ML FlexPen Generic drug: insulin aspart Inject 0-16 Units into the skin in the morning, at noon, in the evening, and at bedtime.   pantoprazole 40 MG tablet Commonly known as: PROTONIX Take 1 tablet (40 mg total) by mouth daily. Start taking on: September 15, 2020   Potassium Chloride ER 20 MEQ Tbcr Take 20 mEq by mouth daily.   sildenafil 20 MG tablet Commonly known as: REVATIO Take 20 mg by mouth 3 (three) times daily.   theophylline 300 MG 12 hr tablet Commonly known as: THEODUR Take 300 mg by mouth 2 (two) times  daily.   tiotropium 18 MCG inhalation capsule Commonly known as: SPIRIVA Place 18 mcg into inhaler and inhale daily.   Ventolin HFA 108 (90 Base) MCG/ACT inhaler Generic drug: albuterol Inhale 2 puffs into the lungs every 6 (six) hours as needed for wheezing or shortness of breath.        Follow-up [facility] Schedule an appointment as soon as possible for a visit in 1 week(s).   Contact information: [place] [address] [phone]                Allergies  Allergen Reactions   Penicillins Anaphylaxis and Other (See Comments)    Has patient had a PCN reaction causing immediate rash, facial/tongue/throat swelling, SOB or lightheadedness with hypotension: Yes Has patient had a PCN reaction causing severe rash involving mucus membranes or skin necrosis: No Has patient had a PCN reaction that required hospitalization: No Has patient had a PCN reaction occurring within the last 10 years: No If all of the above answers are "NO", then may proceed with Cephalosporin use.     Consultations: PCCM  Procedures/Studies: DG Chest 2 View  Result Date: 09/01/2020 CLINICAL DATA:  dyspnea EXAM: CHEST - 2 VIEW COMPARISON:  July 23, 2020 FINDINGS: The cardiomediastinal silhouette is unchanged in contour with irregular RIGHT inferior hilar contours.Unchanged irregular opacity along the RIGHT superior mediastinal border. Tracheostomy tube. No pleural effusion. No pneumothorax. Increased hazy opacities at the LEFT lung base. Visualized abdomen is unremarkable. IMPRESSION: Increased hazy opacities of the LEFT lung base. Differential considerations include infection, aspiration or atelectasis. Electronically Signed   By: Valentino Saxon MD   On: 09/01/2020 16:18   DG Chest Port 1 View  Result Date: 09/05/2020 CLINICAL DATA:  Abnormal chest radiograph, shortness of breath, cough EXAM: PORTABLE CHEST 1 VIEW COMPARISON:  09/01/2020  FINDINGS: No significant interval change in AP portable examination with bandlike scarring and or atelectasis of the right midlung and right lung base, small, layering left pleural effusion and elevation of the left hemidiaphragm, and mild cardiomegaly. Tracheostomy. IMPRESSION: No significant interval change in AP portable examination with bandlike scarring and or atelectasis of the right midlung and right lung base, small, layering left pleural effusion and elevation of the left hemidiaphragm, and mild cardiomegaly. No new airspace opacity. Electronically Signed   By: Eddie Candle M.D.   On: 09/05/2020 16:45   DG Chest Portable 1 View  Result Date: 09/01/2020 CLINICAL DATA:  Dyspnea EXAM: PORTABLE CHEST 1 VIEW COMPARISON:  Chest radiograph from earlier today. FINDINGS: Tracheostomy tube tip overlies the tracheal air column at the thoracic inlet. Stable cardiomediastinal silhouette with normal heart size. No pneumothorax. Mild blunting of the bilateral costophrenic angles, unchanged. No overt pulmonary edema. Improved aeration at the left lung base. Residual mild left lung base hazy opacity. Stable right lung base linear scarring. IMPRESSION: 1. Improved aeration at the left lung base. Residual mild hazy left lung base opacity, favor scarring or atelectasis. 2. Stable mild blunting of the bilateral costophrenic angles, either small pleural effusions or pleuroparenchymal scarring. Electronically Signed   By: Ilona Sorrel M.D.   On: 09/01/2020 19:16    Subjective: Patient was seen and examined today.  No new complaints.  Ready to go home.  Equipment at bedside.  Discharge Exam: Vitals:   09/14/20 0900 09/14/20 1000  BP: 121/74 121/74  Pulse: 72 75  Resp:    Temp:    SpO2: 100% 99%   Vitals:   09/14/20 0800 09/14/20 0820 09/14/20 0900 09/14/20 1000  BP:   121/74 121/74  Pulse: 71  72 75  Resp:      Temp:      TempSrc:      SpO2: 97% 99% 100% 99%  Weight:      Height:        General: Pt is  alert, awake, not in acute distress Cardiovascular: RRR, S1/S2 +, no rubs, no gallops Respiratory: CTA bilaterally, no wheezing, no rhonchi, trach collar in place. Abdominal: Soft, NT, ND, bowel sounds + Extremities: no edema, no cyanosis   The results of significant diagnostics from this hospitalization (including imaging, microbiology, ancillary and laboratory) are listed below for reference.    Microbiology: Recent Results (from the past 240 hour(s))  Culture, BAL-quantitative w Gram Stain     Status: None   Collection Time: 09/07/20 12:40 PM   Specimen: Bronchoalveolar Lavage; Respiratory  Result Value Ref Range Status   Specimen Description   Final    BRONCHIAL ALVEOLAR LAVAGE Performed at [facility], [address], [place], [place] [address]    Special Requests   Final    NONE  Performed at [facility], [address] [place], [place] [address]    Gram Stain   Final    RARE WBC PRESENT, PREDOMINANTLY PMN FEW GRAM POSITIVE RODS RARE GRAM POSITIVE COCCI    Culture   Final    FEW Normal respiratory flora-no Staph aureus or Pseudomonas seen Performed at [facility], [place] [address], [place], [place] [address]    Report Status 09/09/2020 FINAL  Final     Labs: BNP (last 3 results) Recent Labs    01/01/20 2028 07/23/20 1734 09/01/20 1531  BNP 102.4* 218.9* 016.5*   Basic Metabolic Panel: Recent Labs  Lab 09/08/20 0518 09/09/20 0606 09/11/20 0440  NA 144 139 138  K 4.5 4.3 4.2  CL 102 102 100  CO2 35* 33* 34*  GLUCOSE 121* 170* 163*  BUN 25* 23* 20  CREATININE 1.58* 1.58* 1.40*  CALCIUM 8.7* 8.2* 8.2*  MG 2.0 2.0  --   PHOS 3.6  --   --    Liver Function Tests: No results for input(s): AST, ALT, ALKPHOS, BILITOT, PROT, ALBUMIN in the last 168 hours. No results for input(s): LIPASE, AMYLASE in the last 168 hours. No results for input(s): AMMONIA in the last 168 hours. CBC: Recent Labs  Lab 09/10/20 0633  WBC 5.7  HGB  13.2  HCT 40.7  MCV 88.5  PLT 181   Cardiac Enzymes: No results for input(s): CKTOTAL, CKMB, CKMBINDEX, TROPONINI in the last 168 hours. BNP: Invalid input(s): POCBNP CBG: Recent Labs  Lab 09/13/20 0806 09/13/20 1108 09/13/20 1509 09/14/20 0056 09/14/20 0814  GLUCAP 148* 170* 125* 209* 115*   D-Dimer No results for input(s): DDIMER in the last 72 hours. Hgb A1c No results for input(s): HGBA1C in the last 72 hours. Lipid Profile No results for input(s): CHOL, HDL, LDLCALC, TRIG, CHOLHDL, LDLDIRECT in the last 72 hours. Thyroid function studies No results for input(s): TSH, T4TOTAL, T3FREE, THYROIDAB in the last 72 hours.  Invalid input(s): FREET3 Anemia work up No results for input(s): VITAMINB12, FOLATE, FERRITIN, TIBC, IRON, RETICCTPCT in the last 72 hours. Urinalysis    Component Value Date/Time   COLORURINE YELLOW 01/27/2020 1000   APPEARANCEUR CLEAR 01/27/2020 1000   APPEARANCEUR Hazy 06/01/2013 1139   LABSPEC 1.018 01/27/2020 1000   LABSPEC 1.030 06/01/2013 1139   PHURINE 5.0 01/27/2020 1000   GLUCOSEU NEGATIVE 01/27/2020 1000   GLUCOSEU Negative 06/01/2013 1139   HGBUR NEGATIVE 01/27/2020 1000   BILIRUBINUR NEGATIVE 01/27/2020 1000   BILIRUBINUR Negative 06/01/2013 1139   KETONESUR NEGATIVE 01/27/2020 1000   PROTEINUR 30 (A) 01/27/2020 1000   NITRITE NEGATIVE 01/27/2020 1000   LEUKOCYTESUR NEGATIVE 01/27/2020 1000   LEUKOCYTESUR Negative 06/01/2013 1139   Sepsis Labs Invalid input(s): PROCALCITONIN,  WBC,  LACTICIDVEN Microbiology Recent Results (from the past 240 hour(s))  Culture, BAL-quantitative w Gram Stain     Status: None   Collection Time: 09/07/20 12:40 PM   Specimen: Bronchoalveolar Lavage; Respiratory  Result Value Ref Range Status   Specimen Description   Final    BRONCHIAL ALVEOLAR LAVAGE Performed at [facility], [address], [place], [place] [address]    Special Requests   Final    NONE Performed at [facility], [address] [place],  [address]    Gram Stain   Final    RARE WBC PRESENT, PREDOMINANTLY PMN FEW GRAM POSITIVE RODS RARE GRAM POSITIVE COCCI    Culture   Final    FEW Normal  respiratory flora-no Staph aureus or Pseudomonas seen Performed at [facility] [address] [place], [place] [address]    Report Status 09/09/2020 FINAL  Final    Time coordinating discharge: Over 30 minutes  SIGNED:  Lorella Nimrod, MD  [facility] 09/14/2020, 11:21 AM  If 7PM-7AM, please contact night-coverage [email or web address]  This record has been created using Systems analyst. Errors have been sought and corrected,but may not always be located. Such creation errors do not reflect on the standard of care.

## 2020-09-14 NOTE — Progress Notes (Signed)
Patient placed on home ventilator with a 2LPM oxygen bleed-in. Cuff inflated. Patient appears to be tolerating well.  SPO2: 100%, HR: 68.

## 2020-09-22 ENCOUNTER — Ambulatory Visit: Payer: Medicare HMO | Attending: Family | Admitting: Family

## 2020-09-22 ENCOUNTER — Encounter: Payer: Self-pay | Admitting: Family

## 2020-09-22 ENCOUNTER — Other Ambulatory Visit: Payer: Self-pay

## 2020-09-22 VITALS — BP 143/79 | HR 98 | Resp 18 | Ht 72.0 in | Wt 280.0 lb

## 2020-09-22 DIAGNOSIS — Z7982 Long term (current) use of aspirin: Secondary | ICD-10-CM | POA: Diagnosis not present

## 2020-09-22 DIAGNOSIS — E7800 Pure hypercholesterolemia, unspecified: Secondary | ICD-10-CM | POA: Insufficient documentation

## 2020-09-22 DIAGNOSIS — I480 Paroxysmal atrial fibrillation: Secondary | ICD-10-CM | POA: Insufficient documentation

## 2020-09-22 DIAGNOSIS — R002 Palpitations: Secondary | ICD-10-CM | POA: Diagnosis not present

## 2020-09-22 DIAGNOSIS — I5043 Acute on chronic combined systolic (congestive) and diastolic (congestive) heart failure: Secondary | ICD-10-CM | POA: Insufficient documentation

## 2020-09-22 DIAGNOSIS — F17210 Nicotine dependence, cigarettes, uncomplicated: Secondary | ICD-10-CM | POA: Insufficient documentation

## 2020-09-22 DIAGNOSIS — R0602 Shortness of breath: Secondary | ICD-10-CM | POA: Insufficient documentation

## 2020-09-22 DIAGNOSIS — Z09 Encounter for follow-up examination after completed treatment for conditions other than malignant neoplasm: Secondary | ICD-10-CM | POA: Diagnosis not present

## 2020-09-22 DIAGNOSIS — E1122 Type 2 diabetes mellitus with diabetic chronic kidney disease: Secondary | ICD-10-CM | POA: Diagnosis not present

## 2020-09-22 DIAGNOSIS — I130 Hypertensive heart and chronic kidney disease with heart failure and stage 1 through stage 4 chronic kidney disease, or unspecified chronic kidney disease: Secondary | ICD-10-CM | POA: Insufficient documentation

## 2020-09-22 DIAGNOSIS — D869 Sarcoidosis, unspecified: Secondary | ICD-10-CM | POA: Diagnosis not present

## 2020-09-22 DIAGNOSIS — I2510 Atherosclerotic heart disease of native coronary artery without angina pectoris: Secondary | ICD-10-CM | POA: Insufficient documentation

## 2020-09-22 DIAGNOSIS — G4733 Obstructive sleep apnea (adult) (pediatric): Secondary | ICD-10-CM | POA: Diagnosis not present

## 2020-09-22 DIAGNOSIS — I10 Essential (primary) hypertension: Secondary | ICD-10-CM

## 2020-09-22 DIAGNOSIS — Z7901 Long term (current) use of anticoagulants: Secondary | ICD-10-CM | POA: Diagnosis not present

## 2020-09-22 DIAGNOSIS — Z7951 Long term (current) use of inhaled steroids: Secondary | ICD-10-CM | POA: Diagnosis not present

## 2020-09-22 DIAGNOSIS — Z8249 Family history of ischemic heart disease and other diseases of the circulatory system: Secondary | ICD-10-CM | POA: Insufficient documentation

## 2020-09-22 DIAGNOSIS — Z8673 Personal history of transient ischemic attack (TIA), and cerebral infarction without residual deficits: Secondary | ICD-10-CM | POA: Diagnosis not present

## 2020-09-22 DIAGNOSIS — Z794 Long term (current) use of insulin: Secondary | ICD-10-CM | POA: Diagnosis not present

## 2020-09-22 DIAGNOSIS — E119 Type 2 diabetes mellitus without complications: Secondary | ICD-10-CM

## 2020-09-22 DIAGNOSIS — I5042 Chronic combined systolic (congestive) and diastolic (congestive) heart failure: Secondary | ICD-10-CM

## 2020-09-22 DIAGNOSIS — J449 Chronic obstructive pulmonary disease, unspecified: Secondary | ICD-10-CM

## 2020-09-22 DIAGNOSIS — R059 Cough, unspecified: Secondary | ICD-10-CM | POA: Diagnosis not present

## 2020-09-22 DIAGNOSIS — R5383 Other fatigue: Secondary | ICD-10-CM | POA: Insufficient documentation

## 2020-09-22 DIAGNOSIS — N183 Chronic kidney disease, stage 3 unspecified: Secondary | ICD-10-CM | POA: Insufficient documentation

## 2020-09-22 MED ORDER — SACUBITRIL-VALSARTAN 24-26 MG PO TABS: 1.0000 | ORAL_TABLET | Freq: Two times a day (BID) | ORAL | 3 refills | Status: DC

## 2020-09-22 NOTE — Patient Instructions (Addendum)
Continue weighing daily and call for an overnight weight gain of > 2 pounds or a weekly weight gain of >5 pounds.    Begin taking entresto 24/'26mg'$  as 1 tablet in the morning and 1 tablet in the evening

## 2020-09-22 NOTE — Progress Notes (Signed)
Patient ID: [name], male    DOB: 03/30/1970, 50 y.o.   MRN: [id number]  HPI  [name] is a 50 y/o male with a history of CVA, obstructive sleep apnea, HTN, sarcoidosis, hyperlipidemia, DM, CAD, COPD, remote tobacco use and chronic heart failure.  Echo report from 11/17/2018 reviewed and showed an EF of 45-50%. Echo done 02/15/15 and showed an EF of 60% with increased wall thickness. Mild Corey along with mild TR. This is stable from a previous echo done on 08/15/14.  Admitted 09/01/20 due to shortness of breath & hypoxia. Placed on ventilator through his trach. Initially given IV lasix with transition to oral diuretics. Bronchoscopy done and mucus plugging cleared with improvement of his symptoms. Meds adjusted due to hypotension. Needs nighttime noninvasive ventilation through trach. Discharged after 13 days.   He presents today for a follow-up visit with a chief complaint of minimal fatigue upon moderate exertion. He describes this as chronic in nature having been present for several years. He has associated cough, shortness of breath, palpitations and pedal edema along with this. He denies any dizziness, difficulty sleeping, abdominal distention and chest pain.   Says that he's no longer taking lisinopril but he really doesn't remember why it was stopped. Says that overall he's doing well since recent discharge.   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   Stroke ([name])    Tracheostomy status ([place])    Past Surgical History:  Procedure Laterality Date   APPENDECTOMY     PARTIAL NEPHRECTOMY     TRACHEOSTOMY TUBE PLACEMENT N/A 01/14/2020   Procedure: TRACHEOSTOMY;  Surgeon: Clyde Canterbury, MD;  Location: [facility] ORS;  Service: ENT;  Laterality: N/A;   Family History  Problem Relation Age of Onset   Hypertension Mother    Heart disease Mother    Diabetes Mother    Cancer Father    Social History   Tobacco Use   Smoking status: Every Day    Years: 25.00    Types: Cigarettes   Smokeless tobacco: Never  Substance Use Topics   Alcohol use: Yes    Comment: occassional   Allergies  Allergen Reactions   Penicillins Anaphylaxis and Other (See Comments)    Has patient had a PCN reaction causing immediate rash, facial/tongue/throat swelling, SOB or lightheadedness with hypotension: Yes Has patient had a PCN reaction causing severe rash involving mucus membranes or skin necrosis: No Has patient had a PCN reaction that required hospitalization: No Has patient had a PCN reaction occurring within the last 10 years: No If all of the above answers are "NO", then may proceed with Cephalosporin use.    Prior to Admission medications   Medication Sig Start Date End Date Taking? Authorizing Provider  aspirin  EC 81 MG tablet Take 81 mg by mouth daily. Swallow whole.   Yes [provider]  atorvastatin (LIPITOR) 40 MG tablet Take 40 mg by mouth at bedtime. 04/15/20  Yes [provider]  bisoprolol (ZEBETA) 5 MG tablet Take 1 tablet (5 mg total) by mouth daily. 09/15/20  Yes Lorella Nimrod, MD  ELIQUIS 5 MG TABS tablet Take 5 mg by mouth 2 (two) times daily. 07/11/20  Yes [provider]  fluticasone-salmeterol (ADVAIR) 250-50 MCG/ACT AEPB Inhale 1 puff into the lungs every 12 (twelve) hours. 05/23/20  Yes [provider]  furosemide  (LASIX) 40 MG tablet Take 1 tablet (40 mg total) by mouth daily. 09/15/20  Yes Lorella Nimrod, MD  ipratropium-albuterol (DUONEB) 0.5-2.5 (3) MG/3ML SOLN Take 3 mLs by nebulization every 4 (four) hours. 07/12/20  Yes [provider]  melatonin 5 MG TABS Take 5 mg by mouth at bedtime.   Yes [provider]  NOVOLOG FLEXPEN 100 UNIT/ML FlexPen Inject 0-16 Units into the skin in the morning, at noon, in the evening, and at bedtime. 02/19/20  Yes [provider]  pantoprazole (PROTONIX) 40 MG tablet Take 1 tablet (40 mg total) by mouth daily. 09/15/20  Yes Lorella Nimrod, MD  Potassium Chloride ER 20 MEQ TBCR Take 20 mEq by mouth daily. 04/05/20  Yes [provider]  sildenafil (REVATIO) 20 MG tablet Take 20 mg by mouth 3 (three) times daily.   Yes [provider]  theophylline (THEODUR) 300 MG 12 hr tablet Take 300 mg by mouth 2 (two) times daily. 04/08/20  Yes [provider]  tiotropium (SPIRIVA) 18 MCG inhalation capsule Place 18 mcg into inhaler and inhale daily.   Yes [provider]  VENTOLIN HFA 108 (90 Base) MCG/ACT inhaler Inhale 2 puffs into the lungs every 6 (six) hours as needed for wheezing or shortness of breath. 05/25/20  Yes [provider]    Review of Systems  Constitutional:  Positive for fatigue. Negative for appetite change.  HENT:  Negative for congestion, postnasal drip and sore throat.   Eyes: Negative.   Respiratory:  Positive for cough (productive at times) and shortness of breath. Negative for chest tightness and wheezing.   Cardiovascular:  Positive for palpitations (with over exertion) and leg swelling (when standing for long periods of time). Negative for chest pain.  Gastrointestinal:  Negative for abdominal distention and abdominal pain.  Endocrine: Negative.   Genitourinary: Negative.   Musculoskeletal:  Negative for back pain and neck pain.  Skin: Negative.   Allergic/Immunologic: Negative.    Neurological:  Negative for dizziness and light-headedness.  Hematological:  Negative for adenopathy. Does not bruise/bleed easily.  Psychiatric/Behavioral:  Negative for dysphoric mood and sleep disturbance (wearing NIV at night connected to trach). The patient is not nervous/anxious.    Vitals:   09/22/20 1318  BP: (!) 143/79  Pulse: 98  Resp: 18  SpO2: 100%  Weight: 280 lb (127 kg)  Height: 6' (1.829 m)   Wt Readings from Last 3 Encounters:  09/22/20 280 lb (127 kg)  09/13/20 277 lb 9 oz (125.9 kg)  08/01/20 277 lb 12.5 oz (126 kg)   Lab Results  Component Value Date   CREATININE 1.40 (H) 09/11/2020   CREATININE 1.58 (H) 09/09/2020   CREATININE 1.58 (H) 09/08/2020   Physical Exam Vitals and nursing note reviewed.  Constitutional:      Appearance: He is well-developed.  HENT:     Head: Normocephalic and atraumatic.  Neck:     Vascular: No JVD.  Cardiovascular:     Rate and Rhythm: Normal rate and regular rhythm.  Pulmonary:     Effort: Pulmonary effort is normal.     Breath sounds: No wheezing or rales.  Abdominal:     General: There is no distension.     Palpations: Abdomen is soft.     Tenderness: There is no abdominal tenderness.  Musculoskeletal:        General: No tenderness.     Cervical back: Normal range of motion and neck supple.     Right lower leg: No tenderness. Edema (1+ pitting) present.     Left lower leg: No tenderness. Edema (1+ pitting) present.  Skin:    General: Skin is warm and dry.  Neurological:     Mental Status: He is alert and oriented to person, place, and time.  Psychiatric:        Behavior: Behavior normal.        Thought Content: Thought content normal.    Assessment & Plan:  1: Chronic heart failure with mildly reduced ejection fraction- - NYHA class II - euvolemic today - weighing daily; Reminded to call for an overnight weight gain of >2 pounds or a weekly weight gain of >5 pounds - weight up 7 pounds from last visit  here 9 months ago - drinking about 4-6 bottles of water daily.  - cooking with salt but not adding to food. Encouraged to not add salt at atll - saw cardiology ([place]) 06/29/20 - will add entresto 24/'26mg'$  BID; 30 day voucher given to patient - will check BMP at next visit - BNP 09/01/20 was 113.9  2: HTN- - BP mildly elevated today (143/79) - sees PCP @ [employer] ([facility])  - BMP 09/11/20 reviewed and showed sodium 138, potassium 4.2, creatinine 1.4 and GFR >60  3: COPD-  - using nocturnal ventilator at night through his trach - saw pulmonologist [name]) 08/29/20  4: Diabetes- - A1c was 6.6% on 01/28/20 - glucose at home today was 110 - had telemedicine visit with nephrology Smith Mince) 03/17/20   Patient did not bring his medications nor a list. Each medication was verbally reviewed with the patient and he was encouraged to bring the bottles to every visit to confirm accuracy of list.  Return in 2 weeks or sooner for any questions/problems before then.

## 2020-09-27 ENCOUNTER — Ambulatory Visit: Admit: 2020-09-27 | Payer: Medicare HMO | Admitting: Gastroenterology

## 2020-09-27 SURGERY — COLONOSCOPY WITH PROPOFOL
Anesthesia: General

## 2020-10-04 ENCOUNTER — Other Ambulatory Visit: Payer: Self-pay

## 2020-10-04 ENCOUNTER — Ambulatory Visit (HOSPITAL_BASED_OUTPATIENT_CLINIC_OR_DEPARTMENT_OTHER): Payer: Medicare HMO | Admitting: Family

## 2020-10-04 ENCOUNTER — Encounter: Payer: Self-pay | Admitting: Family

## 2020-10-04 ENCOUNTER — Other Ambulatory Visit
Admission: RE | Admit: 2020-10-04 | Discharge: 2020-10-04 | Disposition: A | Discharge status: Home / Self Care | Payer: Medicare HMO | Source: Ambulatory Visit | Attending: Family | Admitting: Family

## 2020-10-04 VITALS — BP 139/74 | HR 84 | Resp 18 | Ht 72.0 in | Wt 268.0 lb

## 2020-10-04 DIAGNOSIS — I2510 Atherosclerotic heart disease of native coronary artery without angina pectoris: Secondary | ICD-10-CM | POA: Insufficient documentation

## 2020-10-04 DIAGNOSIS — I5042 Chronic combined systolic (congestive) and diastolic (congestive) heart failure: Secondary | ICD-10-CM

## 2020-10-04 DIAGNOSIS — I081 Rheumatic disorders of both mitral and tricuspid valves: Secondary | ICD-10-CM | POA: Diagnosis not present

## 2020-10-04 DIAGNOSIS — I130 Hypertensive heart and chronic kidney disease with heart failure and stage 1 through stage 4 chronic kidney disease, or unspecified chronic kidney disease: Secondary | ICD-10-CM | POA: Insufficient documentation

## 2020-10-04 DIAGNOSIS — Z794 Long term (current) use of insulin: Secondary | ICD-10-CM | POA: Insufficient documentation

## 2020-10-04 DIAGNOSIS — Z7951 Long term (current) use of inhaled steroids: Secondary | ICD-10-CM | POA: Insufficient documentation

## 2020-10-04 DIAGNOSIS — I5032 Chronic diastolic (congestive) heart failure: Secondary | ICD-10-CM | POA: Insufficient documentation

## 2020-10-04 DIAGNOSIS — G4733 Obstructive sleep apnea (adult) (pediatric): Secondary | ICD-10-CM | POA: Diagnosis not present

## 2020-10-04 DIAGNOSIS — Z7901 Long term (current) use of anticoagulants: Secondary | ICD-10-CM | POA: Insufficient documentation

## 2020-10-04 DIAGNOSIS — E785 Hyperlipidemia, unspecified: Secondary | ICD-10-CM | POA: Diagnosis not present

## 2020-10-04 DIAGNOSIS — Z8673 Personal history of transient ischemic attack (TIA), and cerebral infarction without residual deficits: Secondary | ICD-10-CM | POA: Insufficient documentation

## 2020-10-04 DIAGNOSIS — Z87891 Personal history of nicotine dependence: Secondary | ICD-10-CM | POA: Insufficient documentation

## 2020-10-04 DIAGNOSIS — Z85528 Personal history of other malignant neoplasm of kidney: Secondary | ICD-10-CM | POA: Insufficient documentation

## 2020-10-04 DIAGNOSIS — E1122 Type 2 diabetes mellitus with diabetic chronic kidney disease: Secondary | ICD-10-CM | POA: Insufficient documentation

## 2020-10-04 DIAGNOSIS — E119 Type 2 diabetes mellitus without complications: Secondary | ICD-10-CM

## 2020-10-04 DIAGNOSIS — D869 Sarcoidosis, unspecified: Secondary | ICD-10-CM | POA: Diagnosis not present

## 2020-10-04 DIAGNOSIS — I5022 Chronic systolic (congestive) heart failure: Secondary | ICD-10-CM | POA: Insufficient documentation

## 2020-10-04 DIAGNOSIS — Z880 Allergy status to penicillin: Secondary | ICD-10-CM | POA: Insufficient documentation

## 2020-10-04 DIAGNOSIS — Z79899 Other long term (current) drug therapy: Secondary | ICD-10-CM | POA: Insufficient documentation

## 2020-10-04 DIAGNOSIS — J449 Chronic obstructive pulmonary disease, unspecified: Secondary | ICD-10-CM | POA: Insufficient documentation

## 2020-10-04 DIAGNOSIS — Z8249 Family history of ischemic heart disease and other diseases of the circulatory system: Secondary | ICD-10-CM | POA: Insufficient documentation

## 2020-10-04 DIAGNOSIS — Z930 Tracheostomy status: Secondary | ICD-10-CM | POA: Insufficient documentation

## 2020-10-04 DIAGNOSIS — I10 Essential (primary) hypertension: Secondary | ICD-10-CM

## 2020-10-04 DIAGNOSIS — R5383 Other fatigue: Secondary | ICD-10-CM | POA: Insufficient documentation

## 2020-10-04 DIAGNOSIS — N183 Chronic kidney disease, stage 3 unspecified: Secondary | ICD-10-CM | POA: Insufficient documentation

## 2020-10-04 LAB — BASIC METABOLIC PANEL
Anion gap: 6 (ref 5–15)
BUN: 24 mg/dL — ABNORMAL HIGH (ref 6–20)
CO2: 30 mmol/L (ref 22–32)
Calcium: 8.8 mg/dL — ABNORMAL LOW (ref 8.9–10.3)
Chloride: 106 mmol/L (ref 98–111)
Creatinine, Ser: 1.59 mg/dL — ABNORMAL HIGH (ref 0.61–1.24)
GFR, Estimated: 53 mL/min — ABNORMAL LOW (ref >60)
Glucose, Bld: 112 mg/dL — ABNORMAL HIGH (ref 70–99)
Potassium: 3.8 mmol/L (ref 3.5–5.1)
Sodium: 142 mmol/L (ref 135–145)

## 2020-10-04 NOTE — Progress Notes (Signed)
Patient ID: [name], male    DOB: [birth year], 50 y.o.   MRN: [id number]  HPI  [name] is a 50 y/o male with a history of CVA, obstructive sleep apnea, HTN, sarcoidosis, hyperlipidemia, DM, CAD, COPD, remote tobacco use and chronic heart failure.  Echo report from 11/17/2018 reviewed and showed an EF of 45-50%. Echo done 02/15/15 and showed an EF of 60% with increased wall thickness. Mild Corey along with mild TR. This is stable from a previous echo done on 08/15/14.  Admitted 09/01/20 due to shortness of breath & hypoxia. Placed on ventilator through his trach. Initially given IV lasix with transition to oral diuretics. Bronchoscopy done and mucus plugging cleared with improvement of his symptoms. Meds adjusted due to hypotension. Needs nighttime noninvasive ventilation through trach. Discharged after 13 days.   He presents today for a follow-up visit with a chief complaint of minimal fatigue upon moderate exertion. He describes this as chronic in nature having been present for several years although he feels like his fatigue is improving. He has associated cough, shortness of breath and pedal edema along with this. He denies any difficulty sleeping, dizziness, abdominal distention, palpitations, chest pain, wheezing or weight gain.   Says that he and his wife are following a keto diet and watching their sugars and carbs closely. He's also drinking grapefruit juice a couple of times a day.   No issues with his entresto that he is aware of.   Past Medical History:  Diagnosis Date   Acute on chronic respiratory failure with hypoxia (HCC)    CHF (congestive heart failure) (HCC)    EF 45-50%- 2020   CKD (chronic kidney disease) stage 3, GFR 30-59 ml/min (HCC)    Clear cell renal cell carcinoma (HCC)    s/p partial nephrectomy   COPD (chronic obstructive pulmonary disease) (HCC)    COPD, severe (HCC)    Coronary artery disease    Diabetes mellitus without complication (HCC)     Hypercholesteremia unk   Hypertension    Myocardial infarction ([place])    Obstructive sleep apnea    Paroxysmal A-fib (HCC)    Sarcoidosis    Sarcoidosis, lung (HCC)    Sleep apnea    does not use CPAP regularly.   Stroke ([place])    Tracheostomy status ([place])    Past Surgical History:  Procedure Laterality Date   APPENDECTOMY     PARTIAL NEPHRECTOMY     TRACHEOSTOMY TUBE PLACEMENT N/A 01/14/2020   Procedure: TRACHEOSTOMY;  Surgeon: Clyde Canterbury, MD;  Location: [facility] ORS;  Service: ENT;  Laterality: N/A;   Family History  Problem Relation Age of Onset   Hypertension Mother    Heart disease Mother    Diabetes Mother    Cancer Father    Social History   Tobacco Use   Smoking status: Every Day    Years: 25.00    Types: Cigarettes   Smokeless tobacco: Never  Substance Use Topics   Alcohol use: Yes    Comment: occassional   Allergies  Allergen Reactions   Penicillins Anaphylaxis and Other (See Comments)    Has patient had a PCN reaction causing immediate rash, facial/tongue/throat swelling, SOB or lightheadedness with hypotension: Yes Has patient had a PCN reaction causing severe rash involving mucus membranes or skin necrosis: No Has patient had a PCN reaction that required hospitalization: No Has patient had a PCN reaction occurring within the last 10 years: No If all of the above answers are "  NO", then may proceed with Cephalosporin use.    Prior to Admission medications   Medication Sig Start Date End Date Taking? Authorizing Provider  aspirin EC 81 MG tablet Take 81 mg by mouth daily. Swallow whole.   Yes [provider]  atorvastatin (LIPITOR) 40 MG tablet Take 40 mg by mouth at bedtime. 04/15/20  Yes [provider]  bisoprolol (ZEBETA) 5 MG tablet Take 1 tablet (5 mg total) by mouth daily. 09/15/20  Yes Lorella Nimrod, MD  ELIQUIS 5 MG TABS tablet Take 5 mg by mouth 2 (two) times daily. 07/11/20  Yes [provider]  fluticasone-salmeterol  (ADVAIR) 250-50 MCG/ACT AEPB Inhale 1 puff into the lungs every 12 (twelve) hours. 05/23/20  Yes [provider]  furosemide (LASIX) 40 MG tablet Take 1 tablet (40 mg total) by mouth daily. 09/15/20  Yes Lorella Nimrod, MD  ipratropium-albuterol (DUONEB) 0.5-2.5 (3) MG/3ML SOLN Take 3 mLs by nebulization every 4 (four) hours. 07/12/20  Yes [provider]  melatonin 5 MG TABS Take 5 mg by mouth at bedtime.   Yes [provider]  NOVOLOG FLEXPEN 100 UNIT/ML FlexPen Inject 0-16 Units into the skin in the morning, at noon, in the evening, and at bedtime. 02/19/20  Yes [provider]  pantoprazole (PROTONIX) 40 MG tablet Take 1 tablet (40 mg total) by mouth daily. 09/15/20  Yes Lorella Nimrod, MD  Potassium Chloride ER 20 MEQ TBCR Take 20 mEq by mouth daily. 04/05/20  Yes [provider]  sacubitril-valsartan (ENTRESTO) 24-26 MG Take 1 tablet by mouth 2 (two) times daily. 09/22/20  Yes Darylene Price A, FNP  sildenafil (REVATIO) 20 MG tablet Take 20 mg by mouth 3 (three) times daily.   Yes [provider]  theophylline (THEODUR) 300 MG 12 hr tablet Take 300 mg by mouth 2 (two) times daily. 04/08/20  Yes [provider]  tiotropium (SPIRIVA) 18 MCG inhalation capsule Place 18 mcg into inhaler and inhale daily.   Yes [provider]  VENTOLIN HFA 108 (90 Base) MCG/ACT inhaler Inhale 2 puffs into the lungs every 6 (six) hours as needed for wheezing or shortness of breath. 05/25/20  Yes [provider]    Review of Systems  Constitutional:  Positive for fatigue (improving). Negative for appetite change.  HENT:  Negative for congestion, postnasal drip and sore throat.   Eyes: Negative.   Respiratory:  Positive for cough (productive at times) and shortness of breath. Negative for chest tightness and wheezing.   Cardiovascular:  Positive for leg swelling (R>L). Negative for chest pain and palpitations.  Gastrointestinal:  Negative for  abdominal distention and abdominal pain.  Endocrine: Negative.   Genitourinary: Negative.   Musculoskeletal:  Negative for back pain and neck pain.  Skin: Negative.   Allergic/Immunologic: Negative.   Neurological:  Negative for dizziness and light-headedness.  Hematological:  Negative for adenopathy. Does not bruise/bleed easily.  Psychiatric/Behavioral:  Negative for dysphoric mood and sleep disturbance (wearing NIV at night connected to trach). The patient is not nervous/anxious.    Vitals:   10/04/20 1258  BP: 139/74  Pulse: 84  Resp: 18  SpO2: 96%  Weight: 268 lb (121.6 kg)  Height: 6' (1.829 m)   Wt Readings from Last 3 Encounters:  10/04/20 268 lb (121.6 kg)  09/22/20 280 lb (127 kg)  09/13/20 277 lb 9 oz (125.9 kg)   Lab Results  Component Value Date   CREATININE 1.40 (H) 09/11/2020   CREATININE 1.58 (H) 09/09/2020  CREATININE 1.58 (H) 09/08/2020   Physical Exam Vitals and nursing note reviewed.  Constitutional:      Appearance: He is well-developed.  HENT:     Head: Normocephalic and atraumatic.  Neck:     Vascular: No JVD.  Cardiovascular:     Rate and Rhythm: Normal rate and regular rhythm.  Pulmonary:     Effort: Pulmonary effort is normal.     Breath sounds: No wheezing or rales.  Abdominal:     General: There is no distension.     Palpations: Abdomen is soft.     Tenderness: There is no abdominal tenderness.  Musculoskeletal:        General: No tenderness.     Cervical back: Normal range of motion and neck supple.     Right lower leg: No tenderness. Edema (1+ pitting) present.     Left lower leg: No tenderness. Edema (1+ pitting) present.  Skin:    General: Skin is warm and dry.  Neurological:     Mental Status: He is alert and oriented to person, place, and time.  Psychiatric:        Behavior: Behavior normal.        Thought Content: Thought content normal.    Assessment & Plan:  1: Chronic heart failure with mildly reduced ejection  fraction- - NYHA class II - euvolemic today - weighing daily; Reminded to call for an overnight weight gain of >2 pounds or a weekly weight gain of >5 pounds - weight down 12\ pounds from last visit here 2 weeks ago; watching sugars/ carbs closely and drinking grapefruit juice a couple of times a day - drinking about 4-6 bottles of water daily.  - cooking with salt but not adding to food. Encouraged to not add salt at atll - saw cardiology ([place]) 06/29/20 - will check BMP today since entresto was started at last visit - on GDMT of bisoprolol and entresto - consider adding spironolactone/ SGLT2 - BNP 09/01/20 was 113.9  2: HTN- - BP looks good today (139/74) - sees PCP @ [facility] ([facility])  - BMP 09/11/20 reviewed and showed sodium 138, potassium 4.2, creatinine 1.4 and GFR >60  3: COPD-  - using nocturnal ventilator at night through his trach - saw pulmonologist [name]) 09/26/20  4: Diabetes- - A1c was 6.6% on 01/28/20 - glucose at home today was 97 - had telemedicine visit with nephrology Smith Mince) 03/17/20   Patient did not bring his medications nor a list. Each medication was verbally reviewed with the patient and he was encouraged to bring the bottles to every visit to confirm accuracy of list.  Return in 1 month or sooner for any questions/problems before then.

## 2020-10-04 NOTE — Patient Instructions (Signed)
Continue weighing daily and call for an overnight weight gain of > 2 pounds or a weekly weight gain of >5 pounds. 

## 2020-10-05 ENCOUNTER — Telehealth: Payer: Self-pay

## 2020-10-05 NOTE — Telephone Encounter (Signed)
Spoke with patient per provider, to review BMP results and let them know sodium and potassium levels were within normal limits.

## 2020-11-03 ENCOUNTER — Ambulatory Visit: Payer: Medicare HMO | Admitting: Family

## 2020-11-11 ENCOUNTER — Ambulatory Visit: Payer: Medicare HMO | Attending: Family | Admitting: Family

## 2020-11-11 ENCOUNTER — Other Ambulatory Visit: Payer: Self-pay

## 2020-11-11 ENCOUNTER — Encounter: Payer: Self-pay | Admitting: Family

## 2020-11-11 VITALS — BP 148/90 | HR 91 | Resp 20 | Ht 72.0 in | Wt 269.4 lb

## 2020-11-11 DIAGNOSIS — D869 Sarcoidosis, unspecified: Secondary | ICD-10-CM | POA: Diagnosis not present

## 2020-11-11 DIAGNOSIS — Z8249 Family history of ischemic heart disease and other diseases of the circulatory system: Secondary | ICD-10-CM | POA: Diagnosis not present

## 2020-11-11 DIAGNOSIS — I509 Heart failure, unspecified: Secondary | ICD-10-CM | POA: Insufficient documentation

## 2020-11-11 DIAGNOSIS — Z880 Allergy status to penicillin: Secondary | ICD-10-CM | POA: Diagnosis not present

## 2020-11-11 DIAGNOSIS — I130 Hypertensive heart and chronic kidney disease with heart failure and stage 1 through stage 4 chronic kidney disease, or unspecified chronic kidney disease: Secondary | ICD-10-CM | POA: Insufficient documentation

## 2020-11-11 DIAGNOSIS — I2510 Atherosclerotic heart disease of native coronary artery without angina pectoris: Secondary | ICD-10-CM | POA: Diagnosis not present

## 2020-11-11 DIAGNOSIS — Z85528 Personal history of other malignant neoplasm of kidney: Secondary | ICD-10-CM | POA: Diagnosis not present

## 2020-11-11 DIAGNOSIS — Z7982 Long term (current) use of aspirin: Secondary | ICD-10-CM | POA: Diagnosis not present

## 2020-11-11 DIAGNOSIS — Z809 Family history of malignant neoplasm, unspecified: Secondary | ICD-10-CM | POA: Diagnosis not present

## 2020-11-11 DIAGNOSIS — Z7951 Long term (current) use of inhaled steroids: Secondary | ICD-10-CM | POA: Diagnosis not present

## 2020-11-11 DIAGNOSIS — Z87891 Personal history of nicotine dependence: Secondary | ICD-10-CM | POA: Diagnosis not present

## 2020-11-11 DIAGNOSIS — E1122 Type 2 diabetes mellitus with diabetic chronic kidney disease: Secondary | ICD-10-CM | POA: Diagnosis not present

## 2020-11-11 DIAGNOSIS — J449 Chronic obstructive pulmonary disease, unspecified: Secondary | ICD-10-CM | POA: Diagnosis not present

## 2020-11-11 DIAGNOSIS — Z833 Family history of diabetes mellitus: Secondary | ICD-10-CM | POA: Insufficient documentation

## 2020-11-11 DIAGNOSIS — Z930 Tracheostomy status: Secondary | ICD-10-CM | POA: Diagnosis not present

## 2020-11-11 DIAGNOSIS — Z79899 Other long term (current) drug therapy: Secondary | ICD-10-CM | POA: Diagnosis not present

## 2020-11-11 DIAGNOSIS — Z9911 Dependence on respirator [ventilator] status: Secondary | ICD-10-CM | POA: Insufficient documentation

## 2020-11-11 DIAGNOSIS — Z8673 Personal history of transient ischemic attack (TIA), and cerebral infarction without residual deficits: Secondary | ICD-10-CM | POA: Diagnosis not present

## 2020-11-11 DIAGNOSIS — Z794 Long term (current) use of insulin: Secondary | ICD-10-CM

## 2020-11-11 DIAGNOSIS — E785 Hyperlipidemia, unspecified: Secondary | ICD-10-CM | POA: Diagnosis not present

## 2020-11-11 DIAGNOSIS — I252 Old myocardial infarction: Secondary | ICD-10-CM | POA: Insufficient documentation

## 2020-11-11 DIAGNOSIS — G4733 Obstructive sleep apnea (adult) (pediatric): Secondary | ICD-10-CM | POA: Diagnosis not present

## 2020-11-11 DIAGNOSIS — E119 Type 2 diabetes mellitus without complications: Secondary | ICD-10-CM

## 2020-11-11 DIAGNOSIS — Z7901 Long term (current) use of anticoagulants: Secondary | ICD-10-CM | POA: Diagnosis not present

## 2020-11-11 DIAGNOSIS — I639 Cerebral infarction, unspecified: Secondary | ICD-10-CM | POA: Diagnosis not present

## 2020-11-11 DIAGNOSIS — I5042 Chronic combined systolic (congestive) and diastolic (congestive) heart failure: Secondary | ICD-10-CM | POA: Diagnosis not present

## 2020-11-11 DIAGNOSIS — I10 Essential (primary) hypertension: Secondary | ICD-10-CM | POA: Diagnosis not present

## 2020-11-11 DIAGNOSIS — I480 Paroxysmal atrial fibrillation: Secondary | ICD-10-CM | POA: Insufficient documentation

## 2020-11-11 DIAGNOSIS — N183 Chronic kidney disease, stage 3 unspecified: Secondary | ICD-10-CM | POA: Insufficient documentation

## 2020-11-11 MED ORDER — EMPAGLIFLOZIN 10 MG PO TABS: 10.0000 mg | ORAL_TABLET | Freq: Every day | ORAL | 5 refills | Status: DC

## 2020-11-11 NOTE — Progress Notes (Signed)
Patient ID: [name], male    DOB: Oct 01, 1970, 50 y.o.   MRN: [id number]  HPI  [name] is a 50 y/o male with a history of CVA, obstructive sleep apnea, HTN, sarcoidosis, hyperlipidemia, DM, CAD, COPD, remote tobacco use and chronic heart failure.  Echo report from 11/17/2018 reviewed and showed an EF of 45-50%. Echo done 02/15/15 and showed an EF of 60% with increased wall thickness. Mild Corey along with mild TR. This is stable from a previous echo done on 08/15/14.  Admitted 09/01/20 due to shortness of breath & hypoxia. Placed on ventilator through his trach. Initially given IV lasix with transition to oral diuretics. Bronchoscopy done and mucus plugging cleared with improvement of his symptoms. Meds adjusted due to hypotension. Needs nighttime noninvasive ventilation through trach. Discharged after 13 days.   He presents today for a follow-up visit with a chief complaint of minimal shortness of breath with moderate exertion. He describes this as chronic in nature having been present for several years. He has associated fatigue, cough, pedal edema & occasional difficulty sleeping along with this. He denies any abdominal distention, palpitations, chest pain, wheezing, dizziness or weight gain.   Has not taken any of his medications yet today because he went to work early at [employer] and didn't take them with him. Feels like he has more swelling today because he's had his legs down all day while at work.   Past Medical History:  Diagnosis Date   Acute on chronic respiratory failure with hypoxia (HCC)    CHF (congestive heart failure) (HCC)    EF 45-50%- 2020   CKD (chronic kidney disease) stage 3, GFR 30-59 ml/min (HCC)    Clear cell renal cell carcinoma (HCC)    s/p partial nephrectomy   COPD (chronic obstructive pulmonary disease) (HCC)    COPD, severe (HCC)    Coronary artery disease    Diabetes mellitus without complication (HCC)    Hypercholesteremia unk   Hypertension    Myocardial  infarction ([place])    Obstructive sleep apnea    Paroxysmal A-fib (HCC)    Sarcoidosis    Sarcoidosis, lung (HCC)    Sleep apnea    does not use CPAP regularly.   Stroke ([place])    Tracheostomy status ([place])    Past Surgical History:  Procedure Laterality Date   APPENDECTOMY     PARTIAL NEPHRECTOMY     TRACHEOSTOMY TUBE PLACEMENT N/A 01/14/2020   Procedure: TRACHEOSTOMY;  Surgeon: Clyde Canterbury, MD;  Location: [facility] ORS;  Service: ENT;  Laterality: N/A;   Family History  Problem Relation Age of Onset   Hypertension Mother    Heart disease Mother    Diabetes Mother    Cancer Father    Social History   Tobacco Use   Smoking status: Former    Years: 25.00    Types: Cigarettes    Quit date: 12/30/2019    Years since quitting: 0.8   Smokeless tobacco: Never  Substance Use Topics   Alcohol use: Yes    Comment: occassional   Allergies  Allergen Reactions   Penicillins Anaphylaxis and Other (See Comments)    Has patient had a PCN reaction causing immediate rash, facial/tongue/throat swelling, SOB or lightheadedness with hypotension: Yes Has patient had a PCN reaction causing severe rash involving mucus membranes or skin necrosis: No Has patient had a PCN reaction that required hospitalization: No Has patient had a PCN reaction occurring within the last 10 years: No If all  of the above answers are "NO", then may proceed with Cephalosporin use.    Prior to Admission medications   Medication Sig Start Date End Date Taking? Authorizing Provider  aspirin EC 81 MG tablet Take 81 mg by mouth daily. Swallow whole.   Yes [provider]  atorvastatin (LIPITOR) 40 MG tablet Take 40 mg by mouth at bedtime. 04/15/20  Yes [provider]  bisoprolol (ZEBETA) 5 MG tablet Take 1 tablet (5 mg total) by mouth daily. 09/15/20  Yes Lorella Nimrod, MD  ELIQUIS 5 MG TABS tablet Take 5 mg by mouth 2 (two) times daily. 07/11/20  Yes [provider]  fluticasone-salmeterol  (ADVAIR) 250-50 MCG/ACT AEPB Inhale 1 puff into the lungs every 12 (twelve) hours. 05/23/20  Yes [provider]  furosemide (LASIX) 40 MG tablet Take 1 tablet (40 mg total) by mouth daily. 09/15/20  Yes Lorella Nimrod, MD  ipratropium-albuterol (DUONEB) 0.5-2.5 (3) MG/3ML SOLN Take 3 mLs by nebulization every 4 (four) hours. 07/12/20  Yes [provider]  melatonin 5 MG TABS Take 5 mg by mouth at bedtime.   Yes [provider]  NOVOLOG FLEXPEN 100 UNIT/ML FlexPen Inject 0-16 Units into the skin in the morning, at noon, in the evening, and at bedtime. 02/19/20  Yes [provider]  pantoprazole (PROTONIX) 40 MG tablet Take 1 tablet (40 mg total) by mouth daily. 09/15/20  Yes Lorella Nimrod, MD  Potassium Chloride ER 20 MEQ TBCR Take 20 mEq by mouth daily. 04/05/20  Yes [provider]  sacubitril-valsartan (ENTRESTO) 24-26 MG Take 1 tablet by mouth 2 (two) times daily. 09/22/20  Yes Darylene Price A, FNP  sildenafil (REVATIO) 20 MG tablet Take 20 mg by mouth 3 (three) times daily. PRN   Yes [provider]  theophylline (THEODUR) 300 MG 12 hr tablet Take 300 mg by mouth 2 (two) times daily. 04/08/20  Yes [provider]  tiotropium (SPIRIVA) 18 MCG inhalation capsule Place 18 mcg into inhaler and inhale daily.   Yes [provider]  VENTOLIN HFA 108 (90 Base) MCG/ACT inhaler Inhale 2 puffs into the lungs every 6 (six) hours as needed for wheezing or shortness of breath. 05/25/20  Yes [provider]   Review of Systems  Constitutional:  Positive for fatigue (improving). Negative for appetite change.  HENT:  Negative for congestion, postnasal drip and sore throat.   Eyes: Negative.   Respiratory:  Positive for cough (productive at times) and shortness of breath. Negative for chest tightness and wheezing.   Cardiovascular:  Positive for leg swelling. Negative for chest pain and palpitations.  Gastrointestinal:  Negative for abdominal  distention and abdominal pain.  Endocrine: Negative.   Genitourinary: Negative.   Musculoskeletal:  Negative for back pain and neck pain.  Skin: Negative.   Allergic/Immunologic: Negative.   Neurological:  Negative for dizziness and light-headedness.  Hematological:  Negative for adenopathy. Does not bruise/bleed easily.  Psychiatric/Behavioral:  Negative for dysphoric mood and sleep disturbance (wearing NIV at night connected to trach). The patient is not nervous/anxious.    Vitals:   11/11/20 1202  BP: (!) 148/90  Pulse: 91  Resp: 20  SpO2: 96%  Weight: 269 lb 6 oz (122.2 kg)  Height: 6' (1.829 m)   Wt Readings from Last 3 Encounters:  11/11/20 269 lb 6 oz (122.2 kg)  10/04/20 268 lb (121.6 kg)  09/22/20 280 lb (127 kg)   Lab Results  Component Value Date   CREATININE 1.59 (H)  10/04/2020   CREATININE 1.40 (H) 09/11/2020   CREATININE 1.58 (H) 09/09/2020   Physical Exam Vitals and nursing note reviewed.  Constitutional:      Appearance: He is well-developed.  HENT:     Head: Normocephalic and atraumatic.  Neck:     Vascular: No JVD.  Cardiovascular:     Rate and Rhythm: Normal rate and regular rhythm.  Pulmonary:     Effort: Pulmonary effort is normal.     Breath sounds: No wheezing or rales.  Abdominal:     General: There is no distension.     Palpations: Abdomen is soft.     Tenderness: There is no abdominal tenderness.  Musculoskeletal:        General: No tenderness.     Cervical back: Normal range of motion and neck supple.     Right lower leg: No tenderness. Edema (1+ pitting) present.     Left lower leg: No tenderness. Edema (1+ pitting) present.  Skin:    General: Skin is warm and dry.  Neurological:     Mental Status: He is alert and oriented to person, place, and time.  Psychiatric:        Behavior: Behavior normal.        Thought Content: Thought content normal.    Assessment & Plan:  1: Chronic heart failure with mildly reduced ejection  fraction- - NYHA class II - euvolemic today - weighing daily; Reminded to call for an overnight weight gain of >2 pounds or a weekly weight gain of >5 pounds - weight unchanged from last visit here 5 weeks ago - drinking about 4-6 bottles of water daily.  - cooking with salt but not adding to food. Encouraged to not add salt at atll - saw cardiology ([place]) 10/25/20 - on GDMT of bisoprolol and entresto - will add jardiance 10mg  daily; 14 day voucher & 3 weeks of samples provided - would like to also add MRA or titrate up entresto at next visit but explained that he needed to take his medications prior to coming to appointments to assess the effectiveness of the medications - last echo done in 2020; need to get this updated after med titration completed - will check BMP next visit - BNP 09/01/20 was 113.9  2: HTN- - BP mildly elevated today but he hasn't taken any of his medications yet today - sees PCP @ [facility] ([facility])  - BMP 09/11/20 reviewed and showed sodium 138, potassium 4.2, creatinine 1.4 and GFR >60  3: COPD-  - using nocturnal ventilator at night through his trach - saw pulmonologist [name]) 10/25/20  4: Diabetes- - A1c was 6.6% on 01/28/20 - glucose at home today was 113 - had telemedicine visit with nephrology Smith Mince) 03/17/20   Patient did not bring his medications nor a list. Each medication was verbally reviewed with the patient and he was encouraged to bring the bottles to every visit to confirm accuracy of list.  Return in 1 month or sooner for any questions/problems before then.

## 2020-11-11 NOTE — Patient Instructions (Addendum)
Continue weighing daily and call for an overnight weight gain of > 2 pounds or a weekly weight gain of >5 pounds.    Begin taking jardiance as 1 tablet once a day

## 2020-11-21 ENCOUNTER — Other Ambulatory Visit: Payer: Self-pay | Admitting: Family

## 2020-11-21 MED ORDER — ATORVASTATIN CALCIUM 40 MG PO TABS: 40.0000 mg | ORAL_TABLET | Freq: Every day | ORAL | 3 refills | Status: DC

## 2020-11-21 NOTE — Progress Notes (Unsigned)
Atorvastatin RX sent to pharmacy

## 2020-12-15 NOTE — Progress Notes (Deleted)
Patient ID: [name], male    DOB: Mar 16, 1970, 50 y.o.   MRN: [id number]  HPI  [name] is a 50 y/o male with a history of CVA, obstructive sleep apnea, HTN, sarcoidosis, hyperlipidemia, DM, CAD, COPD, remote tobacco use and chronic heart failure.  Echo report from 11/17/2018 reviewed and showed an EF of 45-50%. Echo done 02/15/15 and showed an EF of 60% with increased wall thickness. Mild MR along with mild TR. This is stable from a previous echo done on 08/15/14.  Admitted 09/01/20 due to shortness of breath & hypoxia. Placed on ventilator through his trach. Initially given IV lasix with transition to oral diuretics. Bronchoscopy done and mucus plugging cleared with improvement of his symptoms. Meds adjusted due to hypotension. Needs nighttime noninvasive ventilation through trach. Discharged after 13 days.   He presents today for a follow-up visit with a chief complaint of   Past Medical History:  Diagnosis Date   Acute on chronic respiratory failure with hypoxia (HCC)    CHF (congestive heart failure) (HCC)    EF 45-50%- 2020   CKD (chronic kidney disease) stage 3, GFR 30-59 ml/min (HCC)    Clear cell renal cell carcinoma (HCC)    s/p partial nephrectomy   COPD (chronic obstructive pulmonary disease) (HCC)    COPD, severe (HCC)    Coronary artery disease    Diabetes mellitus without complication (HCC)    Hypercholesteremia unk   Hypertension    Myocardial infarction ([place])    Obstructive sleep apnea    Paroxysmal A-fib (HCC)    Sarcoidosis    Sarcoidosis, lung (HCC)    Sleep apnea    does not use CPAP regularly.   Stroke ([name])    Tracheostomy status ([place])    Past Surgical History:  Procedure Laterality Date   APPENDECTOMY     PARTIAL NEPHRECTOMY     TRACHEOSTOMY TUBE PLACEMENT N/A 01/14/2020   Procedure: TRACHEOSTOMY;  Surgeon: Clyde Canterbury, MD;  Location: [facility] ORS;  Service: ENT;  Laterality: N/A;   Family History  Problem Relation Age of Onset   Hypertension  Mother    Heart disease Mother    Diabetes Mother    Cancer Father    Social History   Tobacco Use   Smoking status: Former    Years: 25.00    Types: Cigarettes    Quit date: 12/30/2019    Years since quitting: 0.9   Smokeless tobacco: Never  Substance Use Topics   Alcohol use: Yes    Comment: occassional   Allergies  Allergen Reactions   Penicillins Anaphylaxis and Other (See Comments)    Has patient had a PCN reaction causing immediate rash, facial/tongue/throat swelling, SOB or lightheadedness with hypotension: Yes Has patient had a PCN reaction causing severe rash involving mucus membranes or skin necrosis: No Has patient had a PCN reaction that required hospitalization: No Has patient had a PCN reaction occurring within the last 10 years: No If all of the above answers are "NO", then may proceed with Cephalosporin use.     Review of Systems  Constitutional:  Positive for fatigue (improving). Negative for appetite change.  HENT:  Negative for congestion, postnasal drip and sore throat.   Eyes: Negative.   Respiratory:  Positive for cough (productive at times) and shortness of breath. Negative for chest tightness and wheezing.   Cardiovascular:  Positive for leg swelling. Negative for chest pain and palpitations.  Gastrointestinal:  Negative for abdominal distention and abdominal pain.  Endocrine: Negative.   Genitourinary: Negative.   Musculoskeletal:  Negative for back pain and neck pain.  Skin: Negative.   Allergic/Immunologic: Negative.   Neurological:  Negative for dizziness and light-headedness.  Hematological:  Negative for adenopathy. Does not bruise/bleed easily.  Psychiatric/Behavioral:  Negative for dysphoric mood and sleep disturbance (wearing NIV at night connected to trach). The patient is not nervous/anxious.      Physical Exam Vitals and nursing note reviewed.  Constitutional:      Appearance: He is well-developed.  HENT:     Head: Normocephalic  and atraumatic.  Neck:     Vascular: No JVD.  Cardiovascular:     Rate and Rhythm: Normal rate and regular rhythm.  Pulmonary:     Effort: Pulmonary effort is normal.     Breath sounds: No wheezing or rales.  Abdominal:     General: There is no distension.     Palpations: Abdomen is soft.     Tenderness: There is no abdominal tenderness.  Musculoskeletal:        General: No tenderness.     Cervical back: Normal range of motion and neck supple.     Right lower leg: No tenderness. Edema (1+ pitting) present.     Left lower leg: No tenderness. Edema (1+ pitting) present.  Skin:    General: Skin is warm and dry.  Neurological:     Mental Status: He is alert and oriented to person, place, and time.  Psychiatric:        Behavior: Behavior normal.        Thought Content: Thought content normal.    Assessment & Plan:  1: Chronic heart failure with mildly reduced ejection fraction- - NYHA class II - euvolemic today - weighing daily; Reminded to call for an overnight weight gain of >2 pounds or a weekly weight gain of >5 pounds - weight 269.6 from last visit here 1 month ago - drinking about 4-6 bottles of water daily.  - cooking with salt but not adding to food. Encouraged to not add salt at atll - saw cardiology ([place]) 10/25/20 - on GDMT of bisoprolol, jardiance and entresto - would like to also add MRA or titrate up entresto at next visit but explained that he needed to take his medications prior to coming to appointments to assess the effectiveness of the medications - last echo done in 2020; need to get this updated after med titration completed - will check BMP today since jardiance added at last visit - BNP 09/01/20 was 113.9  2: HTN- - BP  - sees PCP @ [facility] ([facility])  - BMP 09/11/20 reviewed and showed sodium 138, potassium 4.2, creatinine 1.4 and GFR >60  3: COPD-  - using nocturnal ventilator at night through his trach - saw pulmonologist [name])  10/25/20  4: Diabetes- - A1c was 6.6% on 01/28/20 - glucose at home today was  - had telemedicine visit with nephrology Smith Mince) 03/17/20   Patient did not bring his medications nor a list. Each medication was verbally reviewed with the patient and he was encouraged to bring the bottles to every visit to confirm accuracy of list.

## 2020-12-16 ENCOUNTER — Ambulatory Visit: Payer: Medicare HMO | Admitting: Family

## 2020-12-22 ENCOUNTER — Encounter: Payer: Self-pay | Admitting: Family

## 2020-12-22 ENCOUNTER — Other Ambulatory Visit: Payer: Self-pay

## 2020-12-22 ENCOUNTER — Ambulatory Visit (HOSPITAL_BASED_OUTPATIENT_CLINIC_OR_DEPARTMENT_OTHER): Payer: Medicare HMO | Admitting: Family

## 2020-12-22 ENCOUNTER — Other Ambulatory Visit
Admission: RE | Admit: 2020-12-22 | Discharge: 2020-12-22 | Disposition: A | Discharge status: Home / Self Care | Payer: Medicare HMO | Source: Ambulatory Visit | Attending: Family | Admitting: Family

## 2020-12-22 VITALS — BP 139/89 | HR 93 | Resp 20 | Ht 73.0 in | Wt 265.4 lb

## 2020-12-22 DIAGNOSIS — Z794 Long term (current) use of insulin: Secondary | ICD-10-CM | POA: Insufficient documentation

## 2020-12-22 DIAGNOSIS — J449 Chronic obstructive pulmonary disease, unspecified: Secondary | ICD-10-CM

## 2020-12-22 DIAGNOSIS — I2510 Atherosclerotic heart disease of native coronary artery without angina pectoris: Secondary | ICD-10-CM | POA: Diagnosis not present

## 2020-12-22 DIAGNOSIS — E785 Hyperlipidemia, unspecified: Secondary | ICD-10-CM | POA: Insufficient documentation

## 2020-12-22 DIAGNOSIS — I130 Hypertensive heart and chronic kidney disease with heart failure and stage 1 through stage 4 chronic kidney disease, or unspecified chronic kidney disease: Secondary | ICD-10-CM | POA: Insufficient documentation

## 2020-12-22 DIAGNOSIS — Z87891 Personal history of nicotine dependence: Secondary | ICD-10-CM | POA: Insufficient documentation

## 2020-12-22 DIAGNOSIS — N183 Chronic kidney disease, stage 3 unspecified: Secondary | ICD-10-CM | POA: Diagnosis not present

## 2020-12-22 DIAGNOSIS — I5042 Chronic combined systolic (congestive) and diastolic (congestive) heart failure: Secondary | ICD-10-CM

## 2020-12-22 DIAGNOSIS — Z8673 Personal history of transient ischemic attack (TIA), and cerebral infarction without residual deficits: Secondary | ICD-10-CM | POA: Diagnosis not present

## 2020-12-22 DIAGNOSIS — E1122 Type 2 diabetes mellitus with diabetic chronic kidney disease: Secondary | ICD-10-CM | POA: Insufficient documentation

## 2020-12-22 DIAGNOSIS — E7800 Pure hypercholesterolemia, unspecified: Secondary | ICD-10-CM | POA: Insufficient documentation

## 2020-12-22 DIAGNOSIS — E119 Type 2 diabetes mellitus without complications: Secondary | ICD-10-CM | POA: Diagnosis not present

## 2020-12-22 DIAGNOSIS — Z930 Tracheostomy status: Secondary | ICD-10-CM | POA: Diagnosis not present

## 2020-12-22 DIAGNOSIS — Z7984 Long term (current) use of oral hypoglycemic drugs: Secondary | ICD-10-CM | POA: Insufficient documentation

## 2020-12-22 DIAGNOSIS — Z09 Encounter for follow-up examination after completed treatment for conditions other than malignant neoplasm: Secondary | ICD-10-CM | POA: Insufficient documentation

## 2020-12-22 DIAGNOSIS — I081 Rheumatic disorders of both mitral and tricuspid valves: Secondary | ICD-10-CM | POA: Diagnosis not present

## 2020-12-22 DIAGNOSIS — G4733 Obstructive sleep apnea (adult) (pediatric): Secondary | ICD-10-CM | POA: Insufficient documentation

## 2020-12-22 DIAGNOSIS — D869 Sarcoidosis, unspecified: Secondary | ICD-10-CM | POA: Insufficient documentation

## 2020-12-22 DIAGNOSIS — Z7901 Long term (current) use of anticoagulants: Secondary | ICD-10-CM | POA: Insufficient documentation

## 2020-12-22 DIAGNOSIS — I10 Essential (primary) hypertension: Secondary | ICD-10-CM

## 2020-12-22 DIAGNOSIS — R0602 Shortness of breath: Secondary | ICD-10-CM | POA: Insufficient documentation

## 2020-12-22 LAB — BASIC METABOLIC PANEL
Anion gap: 7 (ref 5–15)
BUN: 17 mg/dL (ref 6–20)
CO2: 32 mmol/L (ref 22–32)
Calcium: 8.5 mg/dL — ABNORMAL LOW (ref 8.9–10.3)
Chloride: 102 mmol/L (ref 98–111)
Creatinine, Ser: 1.25 mg/dL — ABNORMAL HIGH (ref 0.61–1.24)
GFR, Estimated: >60 mL/min (ref >60)
Glucose, Bld: 123 mg/dL — ABNORMAL HIGH (ref 70–99)
Potassium: 3.9 mmol/L (ref 3.5–5.1)
Sodium: 141 mmol/L (ref 135–145)

## 2020-12-22 LAB — GLUCOSE, CAPILLARY: Glucose-Capillary: 117 mg/dL — ABNORMAL HIGH (ref 70–99)

## 2020-12-22 NOTE — Progress Notes (Signed)
Patient ID: [name], male    DOB: 1970-02-15, 50 y.o.   MRN: [id number]  HPI  [name] is a 50 y/o male with a history of CVA, obstructive sleep apnea, HTN, sarcoidosis, hyperlipidemia, DM, CAD, COPD, remote tobacco use and chronic heart failure.  Echo report from 11/17/2018 reviewed and showed an EF of 45-50%. Echo done 02/15/15 and showed an EF of 60% with increased wall thickness. Mild Corey along with mild TR. This is stable from a previous echo done on 08/15/14.  Admitted 09/01/20 due to shortness of breath & hypoxia. Placed on ventilator through his trach. Initially given IV lasix with transition to oral diuretics. Bronchoscopy done and mucus plugging cleared with improvement of his symptoms. Meds adjusted due to hypotension. Needs nighttime noninvasive ventilation through trach. Discharged after 13 days.   He presents today for a follow-up visit with a chief complaint of minimal shortness of breath with moderate exertion. He describes this as chronic in nature having been present for several years. He has associated fatigue, cough and pedal edema. He denies any dizziness, difficulty sleeping, chest pain, palpitations, abdominal distention or weight gain.   Past Medical History:  Diagnosis Date   Acute on chronic respiratory failure with hypoxia (HCC)    CHF (congestive heart failure) (HCC)    EF 45-50%- 2020   CKD (chronic kidney disease) stage 3, GFR 30-59 ml/min (HCC)    Clear cell renal cell carcinoma (HCC)    s/p partial nephrectomy   COPD (chronic obstructive pulmonary disease) (HCC)    COPD, severe (HCC)    Coronary artery disease    Diabetes mellitus without complication (HCC)    Hypercholesteremia unk   Hypertension    Myocardial infarction ([place])    Obstructive sleep apnea    Paroxysmal A-fib (HCC)    Sarcoidosis    Sarcoidosis, lung (HCC)    Sleep apnea    does not use CPAP regularly.   Stroke ([place])    Tracheostomy status ([place])    Past Surgical History:   Procedure Laterality Date   APPENDECTOMY     PARTIAL NEPHRECTOMY     TRACHEOSTOMY TUBE PLACEMENT N/A 01/14/2020   Procedure: TRACHEOSTOMY;  Surgeon: Clyde Canterbury, MD;  Location: [facility] ORS;  Service: ENT;  Laterality: N/A;   Family History  Problem Relation Age of Onset   Hypertension Mother    Heart disease Mother    Diabetes Mother    Cancer Father    Social History   Tobacco Use   Smoking status: Former    Years: 25.00    Types: Cigarettes    Quit date: 12/30/2019    Years since quitting: 0.9   Smokeless tobacco: Never  Substance Use Topics   Alcohol use: Yes    Comment: occassional   Allergies  Allergen Reactions   Penicillins Anaphylaxis and Other (See Comments)    Has patient had a PCN reaction causing immediate rash, facial/tongue/throat swelling, SOB or lightheadedness with hypotension: Yes Has patient had a PCN reaction causing severe rash involving mucus membranes or skin necrosis: No Has patient had a PCN reaction that required hospitalization: No Has patient had a PCN reaction occurring within the last 10 years: No If all of the above answers are "NO", then may proceed with Cephalosporin use.    Prior to Admission medications   Medication Sig Start Date End Date Taking? Authorizing Provider  aspirin EC 81 MG tablet Take 81 mg by mouth daily. Swallow whole.   Yes [provider]  atorvastatin (LIPITOR) 40 MG tablet Take 1 tablet (40 mg total) by mouth at bedtime. 11/21/20  Yes Lonisha Bobby, Otila Kluver A, FNP  bisoprolol (ZEBETA) 5 MG tablet Take 1 tablet (5 mg total) by mouth daily. 09/15/20  Yes Lorella Nimrod, MD  ELIQUIS 5 MG TABS tablet Take 5 mg by mouth 2 (two) times daily. 07/11/20  Yes [provider]  empagliflozin (JARDIANCE) 10 MG TABS tablet Take 1 tablet (10 mg total) by mouth daily before breakfast. 11/11/20  Yes Royalti Schauf A, FNP  fluticasone-salmeterol (ADVAIR) 250-50 MCG/ACT AEPB Inhale 1 puff into the lungs every 12 (twelve) hours. 05/23/20   Yes [provider]  furosemide (LASIX) 40 MG tablet Take 1 tablet (40 mg total) by mouth daily. 09/15/20  Yes Lorella Nimrod, MD  ipratropium-albuterol (DUONEB) 0.5-2.5 (3) MG/3ML SOLN Take 3 mLs by nebulization every 4 (four) hours. 07/12/20  Yes [provider]  melatonin 5 MG TABS Take 5 mg by mouth at bedtime.   Yes [provider]  NOVOLOG FLEXPEN 100 UNIT/ML FlexPen Inject 0-16 Units into the skin in the morning, at noon, in the evening, and at bedtime. 02/19/20  Yes [provider]  pantoprazole (PROTONIX) 40 MG tablet Take 1 tablet (40 mg total) by mouth daily. 09/15/20  Yes Lorella Nimrod, MD  Potassium Chloride ER 20 MEQ TBCR Take 20 mEq by mouth daily. 04/05/20  Yes [provider]  sacubitril-valsartan (ENTRESTO) 24-26 MG Take 1 tablet by mouth 2 (two) times daily. 09/22/20  Yes Darylene Price A, FNP  sildenafil (REVATIO) 20 MG tablet Take 20 mg by mouth 3 (three) times daily. PRN   Yes [provider]  theophylline (THEODUR) 300 MG 12 hr tablet Take 300 mg by mouth 2 (two) times daily. 04/08/20  Yes [provider]  tiotropium (SPIRIVA) 18 MCG inhalation capsule Place 18 mcg into inhaler and inhale daily.   Yes [provider]  VENTOLIN HFA 108 (90 Base) MCG/ACT inhaler Inhale 2 puffs into the lungs every 6 (six) hours as needed for wheezing or shortness of breath. 05/25/20  Yes [provider]    Review of Systems  Constitutional:  Positive for fatigue. Negative for appetite change.  HENT:  Negative for congestion, postnasal drip and sore throat.   Eyes: Negative.   Respiratory:  Positive for cough and shortness of breath. Negative for chest tightness.   Cardiovascular:  Positive for leg swelling. Negative for chest pain and palpitations.  Gastrointestinal:  Negative for abdominal distention and abdominal pain.  Endocrine: Negative.   Genitourinary: Negative.   Musculoskeletal:  Negative for back pain.  Skin:  Negative.   Allergic/Immunologic: Negative.   Neurological:  Negative for dizziness and light-headedness.  Hematological:  Negative for adenopathy. Does not bruise/bleed easily.  Psychiatric/Behavioral:  Negative for dysphoric mood and sleep disturbance (wearing NIV at night connected to trach). The patient is not nervous/anxious.    Vitals:   12/22/20 1247  BP: 139/89  Pulse: 93  Resp: 20  SpO2: 99%  Weight: 265 lb 6 oz (120.4 kg)  Height: 6\' 1"  (1.854 m)   Wt Readings from Last 3 Encounters:  12/22/20 265 lb 6 oz (120.4 kg)  11/11/20 269 lb 6 oz (122.2 kg)  10/04/20 268 lb (121.6 kg)   Lab Results  Component Value Date   CREATININE 1.59 (H) 10/04/2020   CREATININE 1.40 (H) 09/11/2020   CREATININE 1.58 (H) 09/09/2020   Physical Exam Vitals and nursing note reviewed.  Constitutional:  Appearance: He is well-developed.  HENT:     Head: Normocephalic and atraumatic.  Neck:     Vascular: No JVD.  Cardiovascular:     Rate and Rhythm: Normal rate and regular rhythm.  Pulmonary:     Effort: Pulmonary effort is normal.     Breath sounds: Examination of the right-lower field reveals wheezing. Examination of the left-lower field reveals wheezing. Wheezing present. No rales.  Abdominal:     General: There is no distension.     Palpations: Abdomen is soft.     Tenderness: There is no abdominal tenderness.  Musculoskeletal:        General: No tenderness.     Cervical back: Normal range of motion and neck supple.     Right lower leg: No tenderness. Edema (1+ pitting) present.     Left lower leg: No tenderness. Edema (1+ pitting) present.  Skin:    General: Skin is warm and dry.  Neurological:     Mental Status: He is alert and oriented to person, place, and time.  Psychiatric:        Behavior: Behavior normal.        Thought Content: Thought content normal.    Assessment & Plan:  1: Chronic heart failure with mildly reduced ejection fraction- - NYHA class II -  euvolemic today - weighing daily; Reminded to call for an overnight weight gain of >2 pounds or a weekly weight gain of >5 pounds - weight down 4 pounds from last visit here 1 month ago - drinking about 4-6 bottles of water daily.  - cooking with salt but not adding to food. Encouraged to not add salt at atll - saw cardiology ([place]) 10/25/20 - on GDMT of bisoprolol, jardiance and entresto - consider adding spironolactone at next visit; will need to make sure he can get labs done 1 week and 1 month after starting it - last echo done in 2020; need to get this updated after med titration completed - will check BMP today since jardiance added at last visit - BNP 09/01/20 was 113.9  2: HTN- - BP mildly elevated (139/89) but he hasn't taken his medications yet today - follows with PCP @ [facility] ([facility])  - BMP 09/11/20 reviewed and showed sodium 138, potassium 4.2, creatinine 1.4 and GFR >60  3: COPD-  - using nocturnal ventilator at night through his trach - saw pulmonologist [name]) 10/25/20  4: Diabetes- - A1c was 6.6% on 01/28/20 - glucose in clinic was 117 - had telemedicine visit with nephrology Smith Mince) 03/17/20   Patient did not bring his medications nor a list. Each medication was verbally reviewed with the patient and he was encouraged to bring the bottles to every visit to confirm accuracy of list.  Return in 3 months or sooner for any questions/problems before then.

## 2020-12-22 NOTE — Patient Instructions (Signed)
Continue weighing daily and call for an overnight weight gain of > 2 pounds or a weekly weight gain of >5 pounds. 

## 2020-12-23 ENCOUNTER — Encounter: Payer: Self-pay | Admitting: Family

## 2021-01-16 ENCOUNTER — Telehealth: Payer: Self-pay | Admitting: Gastroenterology

## 2021-01-16 NOTE — Telephone Encounter (Signed)
Inbound call from pt needing to schedule his colonoscopy

## 2021-01-17 ENCOUNTER — Other Ambulatory Visit: Payer: Self-pay

## 2021-01-17 DIAGNOSIS — Z1211 Encounter for screening for malignant neoplasm of colon: Secondary | ICD-10-CM

## 2021-01-17 MED ORDER — NA SULFATE-K SULFATE-MG SULF 17.5-3.13-1.6 GM/177ML PO SOLN: 1.0000 | Freq: Once | ORAL | 0 refills | Status: AC

## 2021-01-17 NOTE — Telephone Encounter (Signed)
Procedure rescheduled for 02/02/21.

## 2021-01-17 NOTE — Progress Notes (Signed)
Patient was informed of previous Blood Thinner request. See note on 09/06/2020. Pt verbalized understanding.

## 2021-01-25 ENCOUNTER — Telehealth: Payer: Self-pay | Admitting: Gastroenterology

## 2021-01-25 ENCOUNTER — Telehealth: Payer: Self-pay

## 2021-01-25 MED ORDER — PEG 3350-KCL-NA BICARB-NACL 420 G PO SOLR: 4000.0000 mL | Freq: Once | ORAL | 0 refills | Status: AC

## 2021-01-25 NOTE — Telephone Encounter (Signed)
Inbound call from pt stating that he has not received his prep for his procedure. He will need it to be sent to his pharmacy. Thank you.

## 2021-01-25 NOTE — Telephone Encounter (Signed)
Sent in prep for patient

## 2021-02-01 ENCOUNTER — Encounter: Payer: Self-pay | Admitting: Gastroenterology

## 2021-02-02 ENCOUNTER — Encounter
Admission: RE | Disposition: A | Discharge status: Home / Self Care | Payer: Self-pay | Source: Home / Self Care | Attending: Gastroenterology

## 2021-02-02 ENCOUNTER — Encounter: Payer: Self-pay | Admitting: Gastroenterology

## 2021-02-02 ENCOUNTER — Ambulatory Visit: Payer: Medicare HMO | Admitting: Anesthesiology

## 2021-02-02 ENCOUNTER — Other Ambulatory Visit: Payer: Self-pay

## 2021-02-02 ENCOUNTER — Ambulatory Visit
Admission: RE | Admit: 2021-02-02 | Discharge: 2021-02-02 | Disposition: A | Discharge status: Home / Self Care | Payer: Medicare HMO | Attending: Gastroenterology | Admitting: Gastroenterology

## 2021-02-02 DIAGNOSIS — D126 Benign neoplasm of colon, unspecified: Secondary | ICD-10-CM

## 2021-02-02 DIAGNOSIS — E119 Type 2 diabetes mellitus without complications: Secondary | ICD-10-CM | POA: Insufficient documentation

## 2021-02-02 DIAGNOSIS — Z1211 Encounter for screening for malignant neoplasm of colon: Secondary | ICD-10-CM

## 2021-02-02 DIAGNOSIS — Z87891 Personal history of nicotine dependence: Secondary | ICD-10-CM | POA: Insufficient documentation

## 2021-02-02 HISTORY — PX: COLONOSCOPY WITH PROPOFOL: SHX5780

## 2021-02-02 LAB — GLUCOSE, CAPILLARY: Glucose-Capillary: 90 mg/dL (ref 70–99)

## 2021-02-02 SURGERY — COLONOSCOPY WITH PROPOFOL
Anesthesia: General

## 2021-02-02 MED ORDER — SODIUM CHLORIDE 0.9 % IV SOLN: INTRAVENOUS | Status: DC | PRN

## 2021-02-02 MED ORDER — PROPOFOL 10 MG/ML IV BOLUS
INTRAVENOUS | Status: DC | PRN
  Administered 2021-02-02: 70 mg via INTRAVENOUS

## 2021-02-02 MED ORDER — PROPOFOL 500 MG/50ML IV EMUL
INTRAVENOUS | Status: DC | PRN
  Administered 2021-02-02: 150 ug/kg/min via INTRAVENOUS

## 2021-02-02 MED ORDER — LIDOCAINE HCL (CARDIAC) PF 100 MG/5ML IV SOSY
PREFILLED_SYRINGE | INTRAVENOUS | Status: DC | PRN
  Administered 2021-02-02: 100 mg via INTRAVENOUS

## 2021-02-02 MED ORDER — SODIUM CHLORIDE 0.9 % IV SOLN: INTRAVENOUS | Status: DC

## 2021-02-02 MED ORDER — KETAMINE HCL 10 MG/ML IJ SOLN
INTRAMUSCULAR | Status: DC | PRN
  Administered 2021-02-02: 20 mg via INTRAVENOUS

## 2021-02-02 MED ORDER — KETAMINE HCL 50 MG/5ML IJ SOSY
PREFILLED_SYRINGE | INTRAMUSCULAR | Status: AC
  Filled 2021-02-02: qty 5

## 2021-02-02 NOTE — H&P (Signed)
Jonathon Bellows, MD [address] [place], [place], [place], [address] [address], [place], [place], [place], [address] Phone: [phone]  Fax: [phone]  Primary Care Physician:  [place]   Pre-Procedure History & Physical: HPI:  [name] is a 50 y.o. male is here for an colonoscopy.   Past Medical History:  Diagnosis Date   Acute on chronic respiratory failure with hypoxia (HCC)    CHF (congestive heart failure) (HCC)    EF 45-50%- 2020   CKD (chronic kidney disease) stage 3, GFR 30-59 ml/min (HCC)    Clear cell renal cell carcinoma (HCC)    s/p partial nephrectomy   COPD (chronic obstructive pulmonary disease) (HCC)    COPD, severe (HCC)    Coronary artery disease    Diabetes mellitus without complication (HCC)    Hypercholesteremia unk   Hypertension    Myocardial infarction ([place])    Obstructive sleep apnea    Paroxysmal A-fib (HCC)    Sarcoidosis    Sarcoidosis, lung (HCC)    Sleep apnea    does not use CPAP regularly.   Stroke ([place])    Tracheostomy status ([place])     Past Surgical History:  Procedure Laterality Date   APPENDECTOMY     KNEE SURGERY Right    PARTIAL NEPHRECTOMY     TRACHEOSTOMY TUBE PLACEMENT N/A 01/14/2020   Procedure: TRACHEOSTOMY;  Surgeon: Clyde Canterbury, MD;  Location: [facility] ORS;  Service: ENT;  Laterality: N/A;    Prior to Admission medications   Medication Sig Start Date End Date Taking? Authorizing Provider  aspirin EC 81 MG tablet Take 81 mg by mouth daily. Swallow whole.   Yes [provider]  empagliflozin (JARDIANCE) 10 MG TABS tablet Take 1 tablet (10 mg total) by mouth daily before breakfast. 11/11/20  Yes Hackney, Tina A, FNP  fluticasone-salmeterol (ADVAIR) 250-50 MCG/ACT AEPB Inhale 1 puff into the lungs every 12 (twelve) hours. 05/23/20  Yes [provider]  furosemide (LASIX) 40 MG tablet Take 1 tablet (40 mg total) by mouth daily. 09/15/20  Yes Lorella Nimrod, MD   ipratropium-albuterol (DUONEB) 0.5-2.5 (3) MG/3ML SOLN Take 3 mLs by nebulization every 4 (four) hours. 07/12/20  Yes [provider]  melatonin 5 MG TABS Take 5 mg by mouth at bedtime.   Yes [provider]  sacubitril-valsartan (ENTRESTO) 24-26 MG Take 1 tablet by mouth 2 (two) times daily. 09/22/20  Yes Darylene Price A, FNP  theophylline (THEODUR) 300 MG 12 hr tablet Take 300 mg by mouth 2 (two) times daily. 04/08/20  Yes [provider]  tiotropium (SPIRIVA) 18 MCG inhalation capsule Place 18 mcg into inhaler and inhale daily.   Yes [provider]  VENTOLIN HFA 108 (90 Base) MCG/ACT inhaler Inhale 2 puffs into the lungs every 6 (six) hours as needed for wheezing or shortness of breath. 05/25/20  Yes [provider]  atorvastatin (LIPITOR) 40 MG tablet Take 1 tablet (40 mg total) by mouth at bedtime. 11/21/20   Alisa Graff, FNP  bisoprolol (ZEBETA) 5 MG tablet Take 1 tablet (5 mg total) by mouth daily. Patient not taking: Reported on 02/02/2021 09/15/20   Lorella Nimrod, MD  ELIQUIS 5 MG TABS tablet Take 5 mg by mouth 2 (two) times daily. 07/11/20   [provider]  NOVOLOG FLEXPEN 100 UNIT/ML FlexPen Inject 0-16 Units into the skin in the morning, at noon, in the evening, and at bedtime. 02/19/20   [provider]  pantoprazole (PROTONIX) 40 MG tablet Take 1 tablet (40 mg total) by mouth daily. Patient not taking: Reported on 02/02/2021 09/15/20   Lorella Nimrod, MD  Potassium Chloride ER 20 MEQ TBCR Take 20 mEq by mouth daily. 04/05/20   [provider]  sildenafil (REVATIO) 20 MG tablet Take 20 mg by mouth 3 (three) times daily. PRN Patient not taking: Reported on 02/02/2021    [provider]    Allergies as of 01/17/2021 - Review Complete 12/23/2020  Allergen Reaction Noted   Penicillins Anaphylaxis and Other (See Comments) 08/11/2014    Family History  Problem Relation Age of Onset   Hypertension Mother     Heart disease Mother    Diabetes Mother    Cancer Father     Social History   Socioeconomic History   Marital status: Married    Spouse name: Not on file   Number of children: Not on file   Years of education: Not on file   Highest education level: Not on file  Occupational History   Occupation: disabled  Tobacco Use   Smoking status: Former    Years: 25.00    Types: Cigarettes    Quit date: 12/30/2019    Years since quitting: 1.0   Smokeless tobacco: Never  Vaping Use   Vaping Use: Never used  Substance and Sexual Activity   Alcohol use: Yes    Comment: occassional   Drug use: Yes    Types: Marijuana, PCP    Comment: last time smoked 2021   Sexual activity: Yes  Other Topics Concern   Not on file  Social History Narrative   Not on file   Social Determinants of Health   Financial Resource Strain: Not on file  Food Insecurity: Not on file  Transportation Needs: Not on file  Physical Activity: Not on file  Stress: Not on file  Social Connections: Not on file  Intimate Partner Violence: Not on file    Review of Systems: See HPI, otherwise negative ROS  Physical Exam: BP (!) 143/94    Pulse 80    Temp 98.2 F (36.8 C) (Temporal)    Resp 20    Ht 6\' 5"  (1.956 m)    Wt 120.2 kg    SpO2 100%    BMI 31.42 kg/m  General:   Alert,  pleasant and cooperative in NAD Head:  Normocephalic and atraumatic. Neck:  Supple; no masses or thyromegaly.Tracheostomy noted Lungs:  Clear throughout to auscultation, normal respiratory effort.    Heart:  +S1, +S2, Regular rate and rhythm, No edema. Abdomen:  Soft, nontender and nondistended. Normal bowel sounds, without guarding, and without rebound.   Neurologic:  Alert and  oriented x4;  grossly normal neurologically.  Impression/Plan: [name] is here for an colonoscopy to be performed for Screening colonoscopy average risk   Risks, benefits, limitations, and alternatives regarding  colonoscopy have been reviewed with  the patient.  Questions have been answered.  All parties agreeable.   Jonathon Bellows, MD  02/02/2021, 9:19 AM

## 2021-02-02 NOTE — Transfer of Care (Signed)
Immediate Anesthesia Transfer of Care Note  Patient: [name]  Procedure(s) Performed: COLONOSCOPY WITH PROPOFOL  Patient Location: Endoscopy Unit  Anesthesia Type:General  Level of Consciousness: drowsy  Airway & Oxygen Therapy: Patient Spontanous Breathing and Patient connected to nasal cannula oxygen  Post-op Assessment: Report given to RN and Post -op Vital signs reviewed and stable  Post vital signs: Reviewed and stable  Last Vitals:  Vitals Value Taken Time  BP 147/95 02/02/21 0944  Temp    Pulse 93 02/02/21 0945  Resp 32 02/02/21 0945  SpO2 95 % 02/02/21 0945  Vitals shown include unvalidated device data.  Last Pain:  Vitals:   02/02/21 0815  TempSrc: Temporal  PainSc: 0-No pain         Complications: No notable events documented.

## 2021-02-02 NOTE — Anesthesia Postprocedure Evaluation (Signed)
Anesthesia Post Note  Patient: [name]  Procedure(s) Performed: COLONOSCOPY WITH PROPOFOL  Patient location during evaluation: PACU Anesthesia Type: General Level of consciousness: awake and alert, oriented and patient cooperative Pain management: pain level controlled Vital Signs Assessment: post-procedure vital signs reviewed and stable Respiratory status: spontaneous breathing, nonlabored ventilation and respiratory function stable Cardiovascular status: blood pressure returned to baseline and stable Postop Assessment: adequate PO intake Anesthetic complications: no   No notable events documented.   Last Vitals:  Vitals:   02/02/21 0945 02/02/21 1003  BP:    Pulse:    Resp:    Temp: (!) 36 C 37 C  SpO2:      Last Pain:  Vitals:   02/02/21 1016  TempSrc:   PainSc: 0-No pain                 Darrin Nipper

## 2021-02-02 NOTE — Anesthesia Preprocedure Evaluation (Signed)
Anesthesia Evaluation  Patient identified by MRN, date of birth, ID band Patient awake    Reviewed: Allergy & Precautions, NPO status , Patient's Chart, lab work & pertinent test results  History of Anesthesia Complications Negative for: history of anesthetic complications  Airway Mallampati: III   Neck ROM: Full    Dental no notable dental hx.    Pulmonary sleep apnea , COPD,  oxygen dependent, former smoker (quit 2021),  Sarcoidosis; tracheostomy in place   Pulmonary exam normal breath sounds clear to auscultation       Cardiovascular hypertension, + CAD, + Past MI and +CHF  Normal cardiovascular exam Rhythm:Regular Rate:Normal  ECG 09/01/20: NSR, RBBB, LAFB   Neuro/Psych CVA (2014), No Residual Symptoms    GI/Hepatic negative GI ROS,   Endo/Other  diabetes, Type 2Obesity   Renal/GU Renal disease (stage III CKD, RCC s/p partial nephrectomy)     Musculoskeletal   Abdominal   Peds  Hematology negative hematology ROS (+)   Anesthesia Other Findings   Reproductive/Obstetrics                             Anesthesia Physical Anesthesia Plan  ASA: 3  Anesthesia Plan: General   Post-op Pain Management:    Induction: Intravenous  PONV Risk Score and Plan: 2 and Propofol infusion, TIVA and Treatment may vary due to age or medical condition  Airway Management Planned: Tracheostomy  Additional Equipment:   Intra-op Plan:   Post-operative Plan:   Informed Consent: I have reviewed the patients History and Physical, chart, labs and discussed the procedure including the risks, benefits and alternatives for the proposed anesthesia with the patient or authorized representative who has indicated his/her understanding and acceptance.       Plan Discussed with: CRNA  Anesthesia Plan Comments: (LMA/GETA backup discussed.  Patient consented for risks of anesthesia including but not limited  to:  - adverse reactions to medications - damage to eyes, teeth, lips or other oral mucosa - nerve damage due to positioning  - sore throat or hoarseness - damage to heart, brain, nerves, lungs, other parts of body or loss of life  Informed patient about role of CRNA in peri- and intra-operative care.  Patient voiced understanding.)        Anesthesia Quick Evaluation

## 2021-02-02 NOTE — Op Note (Signed)
[facility] Gastroenterology Patient Name: [name] Procedure Date: 02/02/2021 9:19 AM MRN: [id number] Account #: [id number] Date of Birth: Apr 28, 1970 Admit Type: Outpatient Age: 50 Room: [facility] ENDO ROOM 3 Gender: Male Note Status: Finalized Instrument Name: Jasper Riling [id number] Procedure:             Colonoscopy Indications:           Screening for colorectal malignant neoplasm Providers:             Jonathon Bellows MD, MD Referring MD:          [place] [name], MD (Referring MD) Medicines:             Monitored Anesthesia Care Complications:         No immediate complications. Procedure:             Pre-Anesthesia Assessment:                        - Prior to the procedure, a History and Physical was                         performed, and patient medications, allergies and                         sensitivities were reviewed. The patient's tolerance                         of previous anesthesia was reviewed.                        - The risks and benefits of the procedure and the                         sedation options and risks were discussed with the                         patient. All questions were answered and informed                         consent was obtained.                        - ASA Grade Assessment: II - A patient with mild                         systemic disease.                        After obtaining informed consent, the colonoscope was                         passed under direct vision. Throughout the procedure,                         the patient's blood pressure, pulse, and oxygen                         saturations were monitored continuously. The  Colonoscope was introduced through the anus and                         advanced to the the cecum, identified by the                         appendiceal orifice. The colonoscopy was performed                          with ease. The patient tolerated the procedure well.                         The quality of the bowel preparation was poor. Findings:      The perianal and digital rectal examinations were normal.      A 6 mm polyp was found in the descending colon. The polyp was sessile.       The polyp was removed with a cold snare. Resection and retrieval were       complete. To prevent bleeding after the polypectomy, one hemostatic clip       was successfully placed. There was no bleeding during, or at the end, of       the procedure.      The exam was otherwise without abnormality. Impression:            - Preparation of the colon was poor.                        - One 6 mm polyp in the descending colon, removed with                         a cold snare. Resected and retrieved. Clip was placed.                        - The examination was otherwise normal. Recommendation:        - Discharge patient to home (with escort).                        - Resume previous diet.                        - Continue present medications.                        - Await pathology results.                        - Repeat colonoscopy in 6 months because the bowel                         preparation was suboptimal. Procedure Code(s):     --- Professional ---                        [phone], Colonoscopy, flexible; with removal of                         tumor(s), polyp(s), or other lesion(s) by snare  technique Diagnosis Code(s):     --- Professional ---                        Z12.11, Encounter for screening for malignant neoplasm                         of colon                        K63.5, Polyp of colon CPT copyright 2019 American Medical Association. All rights reserved. The codes documented in this report are preliminary and upon coder review may  be revised to meet current compliance requirements. Jonathon Bellows, MD Jonathon Bellows MD, MD 02/02/2021 9:41:19 AM This report has been signed  electronically. Number of Addenda: 0 Note Initiated On: 02/02/2021 9:19 AM Scope Withdrawal Time: 0 hours 7 minutes 35 seconds  Total Procedure Duration: 0 hours 10 minutes 37 seconds  Estimated Blood Loss:  Estimated blood loss: none.      [facility]

## 2021-02-03 LAB — SURGICAL PATHOLOGY

## 2021-02-28 ENCOUNTER — Telehealth: Payer: Self-pay

## 2021-02-28 ENCOUNTER — Other Ambulatory Visit: Payer: Self-pay

## 2021-02-28 DIAGNOSIS — Z86010 Personal history of colonic polyps: Secondary | ICD-10-CM

## 2021-02-28 MED ORDER — SUTAB 1479-225-188 MG PO TABS: 12.0000 | ORAL_TABLET | Freq: Once | ORAL | 0 refills | Status: AC

## 2021-02-28 NOTE — Progress Notes (Signed)
Gastroenterology Pre-Procedure Review  Request Date: 03/31/2021 Requesting Physician: Dr. Marius Ditch   PATIENT REVIEW QUESTIONS: The patient responded to the following health history questions as indicated:    1. Are you having any GI issues? no 2. Do you have a personal history of Polyps? yes (last colonoscopy) 3. Do you have a family history of Colon Cancer or Polyps? no 4. Diabetes Mellitus? yes (type 1) 5. Joint replacements in the past 12 months?no 6. Major health problems in the past 3 months?no does have copd and a trake 7. Any artificial heart valves, MVP, or defibrillator?no    MEDICATIONS & ALLERGIES:    Patient reports the following regarding taking any anticoagulation/antiplatelet therapy:   Plavix, Coumadin, Eliquis, Xarelto, Lovenox, Pradaxa, Brilinta, or Effient? yes (eliquis) Aspirin? yes (81mg )  Patient confirms/reports the following medications:  Current Outpatient Medications  Medication Sig Dispense Refill   aspirin EC 81 MG tablet Take 81 mg by mouth daily. Swallow whole.     atorvastatin (LIPITOR) 40 MG tablet Take 1 tablet (40 mg total) by mouth at bedtime. 90 tablet 3   ELIQUIS 5 MG TABS tablet Take 5 mg by mouth 2 (two) times daily.     empagliflozin (JARDIANCE) 10 MG TABS tablet Take 1 tablet (10 mg total) by mouth daily before breakfast. 30 tablet 5   fluticasone-salmeterol (ADVAIR) 250-50 MCG/ACT AEPB Inhale 1 puff into the lungs every 12 (twelve) hours.     furosemide (LASIX) 40 MG tablet Take 1 tablet (40 mg total) by mouth daily. 30 tablet 1   ipratropium-albuterol (DUONEB) 0.5-2.5 (3) MG/3ML SOLN Take 3 mLs by nebulization every 4 (four) hours.     melatonin 5 MG TABS Take 5 mg by mouth at bedtime.     NOVOLOG FLEXPEN 100 UNIT/ML FlexPen Inject 0-16 Units into the skin in the morning, at noon, in the evening, and at bedtime.     Potassium Chloride ER 20 MEQ TBCR Take 20 mEq by mouth daily.     sacubitril-valsartan (ENTRESTO) 24-26 MG Take 1 tablet by mouth  2 (two) times daily. 60 tablet 3   theophylline (THEODUR) 300 MG 12 hr tablet Take 300 mg by mouth 2 (two) times daily.     tiotropium (SPIRIVA) 18 MCG inhalation capsule Place 18 mcg into inhaler and inhale daily.     VENTOLIN HFA 108 (90 Base) MCG/ACT inhaler Inhale 2 puffs into the lungs every 6 (six) hours as needed for wheezing or shortness of breath.     No current facility-administered medications for this visit.    Patient confirms/reports the following allergies:  Allergies  Allergen Reactions   Penicillins Anaphylaxis and Other (See Comments)    Has patient had a PCN reaction causing immediate rash, facial/tongue/throat swelling, SOB or lightheadedness with hypotension: Yes Has patient had a PCN reaction causing severe rash involving mucus membranes or skin necrosis: No Has patient had a PCN reaction that required hospitalization: No Has patient had a PCN reaction occurring within the last 10 years: No If all of the above answers are "NO", then may proceed with Cephalosporin use.     No orders of the defined types were placed in this encounter.   AUTHORIZATION INFORMATION Primary Insurance: 1D#: Group #:  Secondary Insurance: 1D#: Group #:  SCHEDULE INFORMATION: Date: 03/31/2021 Time: Location:[facility]

## 2021-02-28 NOTE — Telephone Encounter (Signed)
Called patient about his blood thinner no answer left voicemail for a call back he can stop eliquis and Asprin 81 mg 3 days prior to procedure per doctor paraschos

## 2021-02-28 NOTE — Addendum Note (Signed)
Addended by: Eliseo Squires on: 02/28/2021 08:28 AM   Modules accepted: Orders

## 2021-03-01 ENCOUNTER — Telehealth: Payer: Self-pay

## 2021-03-01 NOTE — Telephone Encounter (Signed)
Spoke with patient gave him his blood thinner instructions patient understands

## 2021-03-14 NOTE — Progress Notes (Signed)
Appears that the polyp specimen was lost with stool since the prep was inadequate.  Repeat colonoscopy in 6 months as recommended with bowel prep over 2 days

## 2021-03-20 NOTE — Progress Notes (Signed)
[name] ID: [name], male    DOB: 02-09-1970, 51 y.o.   MRN: [id number]  HPI  [name] is a 51 y/o male with a history of CVA, obstructive sleep apnea, HTN, sarcoidosis, hyperlipidemia, DM, CAD, COPD, remote tobacco use and chronic heart failure.  Echo report from 11/17/2018 reviewed and showed an EF of 45-50%. Echo done 02/15/15 and showed an EF of 60% with increased wall thickness. Mild Corey along with mild TR. This is stable from a previous echo done on 08/15/14.  Has not been admitted or been in the ED in the last 6 months.   [name] presents today for a follow-up visit with a chief complaint of minimal fatigue upon moderate exertion. [name] describes this as chronic in nature having been present for several year. [name] has associated cough, shortness of breath, bilateral leg cramping and intermittent pedal edema along with this.[name] denies any difficulty sleeping, dizziness, abdominal distention, palpitations, chest pain or weight gain.   Continues to have [name] trach in place and admits that [name] would like it removed but recognizes that [name] could potentially need it in again. Occasionally misses taking [name] diuretic and when [name] does, [name] notices that the leg swelling is worse. Resolves once [name] takes the diuretic along with elevating [name] legs.   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    Stroke [facility])    Tracheostomy status [facility])    Past Surgical History:  Procedure Laterality Date   APPENDECTOMY     COLONOSCOPY WITH PROPOFOL N/A 02/02/2021   Procedure: COLONOSCOPY WITH PROPOFOL;  Surgeon: Jonathon Bellows, MD;  Location: [facility] ENDOSCOPY;  Service: Gastroenterology;  Laterality: N/A;   KNEE SURGERY Right    PARTIAL NEPHRECTOMY     TRACHEOSTOMY TUBE PLACEMENT N/A 01/14/2020   Procedure: TRACHEOSTOMY;  Surgeon: Clyde Canterbury, MD;  Location: [facility] ORS;  Service: ENT;  Laterality: N/A;   Family History  Problem Relation Age of Onset   Hypertension Mother    Heart disease Mother    Diabetes Mother    Cancer Father    Social History   Tobacco Use   Smoking status: Former    Years: 25.00    Types: Cigarettes    Quit date: 12/30/2019    Years since quitting: 1.2   Smokeless tobacco: Never  Substance Use Topics   Alcohol use: Yes    Comment: occassional   Allergies  Allergen Reactions   Penicillins Anaphylaxis and Other (See Comments)    Has [name] had a PCN reaction causing immediate rash, facial/tongue/throat swelling, SOB or lightheadedness with hypotension: Yes Has [name] had a PCN reaction causing severe rash involving mucus membranes or skin necrosis: No Has [name] had a PCN reaction that required hospitalization: No Has [name] had a  PCN reaction occurring within the last 10 years: No If all of the above answers are "NO", then may proceed with Cephalosporin use.    Prior to Admission medications   Medication Sig Start Date End Date Taking? Authorizing Provider  aspirin EC 81 MG tablet Take 81 mg by mouth daily. Swallow whole.   Yes [provider]  atorvastatin (LIPITOR) 40 MG tablet Take 1 tablet (40 mg total) by mouth at bedtime. 11/21/20  Yes Lessie Manigo A, FNP  ELIQUIS 5 MG TABS tablet Take 5 mg by mouth 2 (two) times daily. 07/11/20  Yes [provider]  empagliflozin (JARDIANCE) 10 MG TABS tablet Take 1 tablet (10 mg total) by mouth  daily before breakfast. 11/11/20  Yes Kenyonna Micek A, FNP  fluticasone-salmeterol (ADVAIR) 250-50 MCG/ACT AEPB Inhale 1 puff into the lungs every 12 (twelve) hours. 05/23/20  Yes [provider]  furosemide (LASIX) 40 MG tablet Take 1 tablet (40 mg total) by mouth daily. 09/15/20  Yes Lorella Nimrod, MD  ipratropium-albuterol (DUONEB) 0.5-2.5 (3) MG/3ML SOLN Take 3 mLs by nebulization every 4 (four) hours. 07/12/20  Yes [provider]  melatonin 5 MG TABS Take 5 mg by mouth at bedtime.   Yes [provider]  metoprolol tartrate (LOPRESSOR) 100 MG tablet Take 100 mg by mouth daily. 03/14/21  Yes [provider]  NOVOLOG FLEXPEN 100 UNIT/ML FlexPen Inject 0-16 Units into the skin in the morning, at noon, in the evening, and at bedtime. 02/19/20  Yes [provider]  Potassium Chloride ER 20 MEQ TBCR Take 20 mEq by mouth daily. 04/05/20  Yes [provider]  sacubitril-valsartan (ENTRESTO) 24-26 MG Take 1 tablet by mouth 2 (two) times daily. 09/22/20  Yes Darylene Price A, FNP  sildenafil (VIAGRA) 25 MG tablet Take 25 mg by mouth daily as needed for erectile dysfunction.   Yes [provider]  theophylline (THEODUR) 300 MG 12 hr tablet Take 300 mg by mouth 2 (two) times daily. 04/08/20  Yes [provider]  tiotropium (SPIRIVA) 18 MCG inhalation capsule Place 18 mcg into inhaler and inhale daily.   Yes [provider]  VENTOLIN HFA 108 (90 Base) MCG/ACT inhaler Inhale 2 puffs into the lungs every 6 (six) hours as needed for wheezing or shortness of breath. 05/25/20  Yes [provider]   Review of Systems  Constitutional:  Positive for fatigue. Negative for appetite change.  HENT:  Negative for congestion, postnasal drip and sore throat.   Eyes: Negative.   Respiratory:  Positive for cough and shortness of breath. Negative for chest tightness and wheezing.   Cardiovascular:  Positive for leg swelling. Negative for chest pain  and palpitations.  Gastrointestinal:  Negative for abdominal distention and abdominal pain.  Endocrine: Negative.   Genitourinary: Negative.   Musculoskeletal:  Negative for back pain.  Skin: Negative.   Allergic/Immunologic: Negative.   Neurological:  Negative for dizziness and light-headedness.  Hematological:  Negative for adenopathy. Does not bruise/bleed easily.  Psychiatric/Behavioral:  Negative for dysphoric mood and sleep disturbance (wearing NIV at night connected to trach). The [name] is not nervous/anxious.    Vitals:   03/22/21 0822  BP: 132/80  Pulse: 86  Resp: 18  SpO2: 96%  Weight: 263 lb 4 oz (119.4 kg)  Height: 6\' 1"  (1.854 m)   Wt Readings from Last 3 Encounters:  03/22/21 263 lb 4 oz (119.4 kg)  02/02/21 265 lb (120.2 kg)  12/22/20 265 lb 6 oz (120.4 kg)  Lab Results  Component Value Date   CREATININE 1.25 (H) 12/22/2020   CREATININE 1.59 (H) 10/04/2020   CREATININE 1.40 (H) 09/11/2020   Physical Exam Vitals and nursing note reviewed.  Constitutional:      Appearance: [name] is well-developed.  HENT:     Head: Normocephalic and atraumatic.  Neck:     Vascular: No JVD.  Cardiovascular:     Rate and Rhythm: Normal rate and regular rhythm.  Pulmonary:     Effort: Pulmonary effort is normal.     Breath sounds: Examination of the right-lower field reveals wheezing. Examination of the left-lower field reveals wheezing. Wheezing present. No rales.  Abdominal:     General: There is no distension.     Palpations: Abdomen is soft.     Tenderness: There is no abdominal tenderness.  Musculoskeletal:        General: No tenderness.     Cervical back: Normal range of motion and neck supple.     Right lower leg: No tenderness. Edema (1+ pitting) present.     Left lower leg: No tenderness. Edema (1+ pitting) present.  Skin:    General: Skin is warm and dry.  Neurological:     Mental Status: [name] is alert and oriented to person, place, and time.  Psychiatric:         Behavior: Behavior normal.        Thought Content: Thought content normal.    Assessment & Plan:  1: Chronic heart failure with mildly reduced ejection fraction- - NYHA class II - euvolemic today - weighing daily; Reminded to call for an overnight weight gain of >2 pounds or a weekly weight gain of >5 pounds - weight down 2 pounds from last visit here 3 months ago - drinking about 4-6 bottles of water daily.  - cooking with salt but not adding to food. Encouraged to not add salt at atll - saw cardiology (Paraschos) 02/15/21 - on GDMT of bisoprolol, jardiance and entresto - will add spironolactone 25mg  daily - [name] will get labs done on 03/30/21 with f/u visit here in 1 month with repeat labs then as well - will also check magnesium level next week to make sure this is causing [name] leg cramping - last echo done in 2020; need to get this updated after med titration completed - BNP 09/01/20 was 113.9 - PharmD reconciled medications with the [name]  2: HTN- - BP mildly elevated (132/80); adding spironolactone per above - follows with PCP @ [facility] ([facility])  - BMP 12/22/20 reviewed and showed sodium 141, potassium 3.9, creatinine 1.25 and GFR >60  3: COPD-  - using nocturnal ventilator at night through [name] trach - saw pulmonologist [name]) 02/28/21  4: Diabetes- - A1c was 6.6% on 01/28/20 - had telemedicine visit with nephrology Smith Mince) 03/17/20   [name] did not bring [name] medications nor a list. Each medication was verbally reviewed with the [name] and [name] was encouraged to bring the bottles to every visit to confirm accuracy of list.  Return in 1 month, sooner if needed.

## 2021-03-22 ENCOUNTER — Encounter: Payer: Self-pay | Admitting: Family

## 2021-03-22 ENCOUNTER — Other Ambulatory Visit: Payer: Self-pay | Admitting: Family

## 2021-03-22 ENCOUNTER — Other Ambulatory Visit: Payer: Self-pay

## 2021-03-22 ENCOUNTER — Ambulatory Visit: Payer: Medicare HMO | Attending: Family | Admitting: Family

## 2021-03-22 VITALS — BP 132/80 | HR 86 | Resp 18 | Ht 73.0 in | Wt 263.2 lb

## 2021-03-22 DIAGNOSIS — Z8673 Personal history of transient ischemic attack (TIA), and cerebral infarction without residual deficits: Secondary | ICD-10-CM | POA: Insufficient documentation

## 2021-03-22 DIAGNOSIS — I2510 Atherosclerotic heart disease of native coronary artery without angina pectoris: Secondary | ICD-10-CM | POA: Insufficient documentation

## 2021-03-22 DIAGNOSIS — Z930 Tracheostomy status: Secondary | ICD-10-CM | POA: Diagnosis not present

## 2021-03-22 DIAGNOSIS — E119 Type 2 diabetes mellitus without complications: Secondary | ICD-10-CM

## 2021-03-22 DIAGNOSIS — R252 Cramp and spasm: Secondary | ICD-10-CM | POA: Diagnosis not present

## 2021-03-22 DIAGNOSIS — I130 Hypertensive heart and chronic kidney disease with heart failure and stage 1 through stage 4 chronic kidney disease, or unspecified chronic kidney disease: Secondary | ICD-10-CM | POA: Diagnosis present

## 2021-03-22 DIAGNOSIS — Z87891 Personal history of nicotine dependence: Secondary | ICD-10-CM | POA: Diagnosis not present

## 2021-03-22 DIAGNOSIS — N183 Chronic kidney disease, stage 3 unspecified: Secondary | ICD-10-CM | POA: Insufficient documentation

## 2021-03-22 DIAGNOSIS — I5022 Chronic systolic (congestive) heart failure: Secondary | ICD-10-CM | POA: Insufficient documentation

## 2021-03-22 DIAGNOSIS — D869 Sarcoidosis, unspecified: Secondary | ICD-10-CM | POA: Diagnosis not present

## 2021-03-22 DIAGNOSIS — Z7984 Long term (current) use of oral hypoglycemic drugs: Secondary | ICD-10-CM | POA: Diagnosis not present

## 2021-03-22 DIAGNOSIS — Z79899 Other long term (current) drug therapy: Secondary | ICD-10-CM | POA: Insufficient documentation

## 2021-03-22 DIAGNOSIS — G4733 Obstructive sleep apnea (adult) (pediatric): Secondary | ICD-10-CM | POA: Insufficient documentation

## 2021-03-22 DIAGNOSIS — E785 Hyperlipidemia, unspecified: Secondary | ICD-10-CM | POA: Diagnosis not present

## 2021-03-22 DIAGNOSIS — E1122 Type 2 diabetes mellitus with diabetic chronic kidney disease: Secondary | ICD-10-CM | POA: Diagnosis not present

## 2021-03-22 DIAGNOSIS — M7989 Other specified soft tissue disorders: Secondary | ICD-10-CM | POA: Insufficient documentation

## 2021-03-22 DIAGNOSIS — I5042 Chronic combined systolic (congestive) and diastolic (congestive) heart failure: Secondary | ICD-10-CM | POA: Diagnosis not present

## 2021-03-22 DIAGNOSIS — Z9911 Dependence on respirator [ventilator] status: Secondary | ICD-10-CM | POA: Diagnosis not present

## 2021-03-22 DIAGNOSIS — I10 Essential (primary) hypertension: Secondary | ICD-10-CM

## 2021-03-22 DIAGNOSIS — J449 Chronic obstructive pulmonary disease, unspecified: Secondary | ICD-10-CM | POA: Diagnosis not present

## 2021-03-22 DIAGNOSIS — Z794 Long term (current) use of insulin: Secondary | ICD-10-CM

## 2021-03-22 MED ORDER — SPIRONOLACTONE 25 MG PO TABS: 25.0000 mg | ORAL_TABLET | Freq: Every day | ORAL | 3 refills | Status: DC

## 2021-03-22 NOTE — Progress Notes (Signed)
[place] - PHARMACIST COUNSELING NOTE  Guideline-Directed Medical Therapy/Evidence Based Medicine  ACE/ARB/ARNI: Sacubitril-valsartan 24-26 mg twice daily Beta Blocker:  Metoprolol tartrate 100mg  once daily Aldosterone Antagonist:  N/A Diuretic: Furosemide 40 mg daily SGLT2i: Empagliflozin 10 mg daily  Adherence Assessment  Do you ever forget to take your medication? [] Yes [x] No  Do you ever skip doses due to side effects? [] Yes [x] No  Do you have trouble affording your medicines? [] Yes [x] No  Are you ever unable to pick up your medication due to transportation difficulties? [] Yes [x] No  Do you ever stop taking your medications because you don't believe they are helping? [] Yes [x] No  Do you check your weight daily? [x] Yes [] No   Adherence strategy: Patient reports compliance to medications   Barriers to obtaining medications: N/A  Vital signs: HR 86 , BP 132/80, weight (pounds) 263 ECHO: Date 11/17/2018, EF 45-50%  BMP Latest Ref Rng & Units 12/22/2020 10/04/2020 09/11/2020  Glucose 70 - 99 mg/dL 123(H) 112(H) 163(H)  BUN 6 - 20 mg/dL 17 24(H) 20  Creatinine 0.61 - 1.24 mg/dL 1.25(H) 1.59(H) 1.40(H)  Sodium 135 - 145 mmol/L 141 142 138  Potassium 3.5 - 5.1 mmol/L 3.9 3.8 4.2  Chloride 98 - 111 mmol/L 102 106 100  CO2 22 - 32 mmol/L 32 30 34(H)  Calcium 8.9 - 10.3 mg/dL 8.5(L) 8.8(L) 8.2(L)    Past Medical History:  Diagnosis Date   Acute on chronic respiratory failure with hypoxia (HCC)    CHF (congestive heart failure) (HCC)    EF 45-50%- 2020   CKD (chronic kidney disease) stage 3, GFR 30-59 ml/min (HCC)    Clear cell renal cell carcinoma (HCC)    s/p partial nephrectomy   COPD (chronic obstructive pulmonary disease) (HCC)    COPD, severe (HCC)    Coronary artery disease    Diabetes mellitus without complication (HCC)    Hypercholesteremia unk   Hypertension    Myocardial infarction ([place])    Obstructive sleep  apnea    Paroxysmal A-fib ([place])    Sarcoidosis    Sarcoidosis, lung ([place])    Sleep apnea    does not use CPAP regularly.   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 Patient complains of leg/feet cramping that somewhat improves after taking potassium 25meq tablet.   Recent ED Visit (past 6 months): Date - 09/01/2020, CC - COPD exacerbation  PLAN  Based on the previous serum potassium levels, blood pressure trends, and renal function I would recommend adding spironolactone 25 mg once daily and collecting serum potassium and creatinine 1 week and 1 month after starting it.  Time spent: 30 minutes  Darrick Penna, Pharm.D. Clinical Pharmacist 03/22/2021 3:10 PM    Current Outpatient Medications:    aspirin EC 81 MG tablet, Take 81 mg by mouth daily. Swallow whole., Disp: , Rfl:    atorvastatin (LIPITOR) 40 MG tablet, Take 1 tablet (40 mg total) by mouth at bedtime., Disp: 90 tablet, Rfl: 3   ELIQUIS 5 MG TABS tablet, Take 5 mg by mouth 2 (two) times daily., Disp: , Rfl:    empagliflozin (JARDIANCE) 10 MG TABS tablet, Take 1 tablet (10 mg total) by mouth daily before breakfast., Disp: 30 tablet, Rfl: 5   fluticasone-salmeterol (ADVAIR) 250-50 MCG/ACT AEPB, Inhale 1 puff into the lungs every 12 (twelve) hours., Disp: , Rfl:  furosemide (LASIX) 40 MG tablet, Take 1 tablet (40 mg total) by mouth daily., Disp: 30 tablet, Rfl: 1   ipratropium-albuterol (DUONEB) 0.5-2.5 (3) MG/3ML SOLN, Take 3 mLs by nebulization every 4 (four) hours., Disp: , Rfl:    melatonin 5 MG TABS, Take 5 mg by mouth at bedtime., Disp: , Rfl:    metoprolol tartrate (LOPRESSOR) 100 MG tablet, Take 100 mg by mouth daily., Disp: , Rfl:    NOVOLOG FLEXPEN 100 UNIT/ML FlexPen, Inject 0-16 Units into the skin in the morning, at noon, in the evening, and at bedtime., Disp: , Rfl:    Potassium Chloride ER 20 MEQ TBCR, Take 20 mEq by mouth  daily., Disp: , Rfl:    sacubitril-valsartan (ENTRESTO) 24-26 MG, Take 1 tablet by mouth 2 (two) times daily., Disp: 60 tablet, Rfl: 3   sildenafil (VIAGRA) 25 MG tablet, Take 25 mg by mouth daily as needed for erectile dysfunction., Disp: , Rfl:    spironolactone (ALDACTONE) 25 MG tablet, Take 1 tablet (25 mg total) by mouth daily., Disp: 30 tablet, Rfl: 3   theophylline (THEODUR) 300 MG 12 hr tablet, Take 300 mg by mouth 2 (two) times daily., Disp: , Rfl:    tiotropium (SPIRIVA) 18 MCG inhalation capsule, Place 18 mcg into inhaler and inhale daily., Disp: , Rfl:    VENTOLIN HFA 108 (90 Base) MCG/ACT inhaler, Inhale 2 puffs into the lungs every 6 (six) hours as needed for wheezing or shortness of breath., Disp: , Rfl:   MEDICATION ADHERENCES TIPS AND STRATEGIES Taking medication as prescribed improves patient outcomes in heart failure (reduces hospitalizations, improves symptoms, increases survival) Side effects of medications can be managed by decreasing doses, switching agents, stopping drugs, or adding additional therapy. Please let someone in the [facility] know if you have having bothersome side effects so we can modify your regimen. Do not alter your medication regimen without talking to [place].  Medication reminders can help patients remember to take drugs on time. If you are missing or forgetting doses you can try linking behaviors, using pill boxes, or an electronic reminder like an alarm on your phone or an app. Some people can also get automated phone calls as medication reminders.

## 2021-03-22 NOTE — Patient Instructions (Addendum)
Continue weighing daily and call for an overnight weight gain of 3 pounds or more or a weekly weight gain of more than 5 pounds.   Begin spironolactone as 1 tablet every morning

## 2021-04-05 ENCOUNTER — Other Ambulatory Visit
Admission: RE | Admit: 2021-04-05 | Discharge: 2021-04-05 | Disposition: A | Discharge status: Home / Self Care | Payer: Medicare HMO | Source: Ambulatory Visit | Attending: Family | Admitting: Family

## 2021-04-05 ENCOUNTER — Other Ambulatory Visit: Payer: Self-pay

## 2021-04-05 DIAGNOSIS — R252 Cramp and spasm: Secondary | ICD-10-CM | POA: Insufficient documentation

## 2021-04-05 DIAGNOSIS — Z01812 Encounter for preprocedural laboratory examination: Secondary | ICD-10-CM | POA: Diagnosis present

## 2021-04-05 DIAGNOSIS — I509 Heart failure, unspecified: Secondary | ICD-10-CM | POA: Insufficient documentation

## 2021-04-05 LAB — BASIC METABOLIC PANEL
Anion gap: 9 (ref 5–15)
BUN: 17 mg/dL (ref 6–20)
CO2: 27 mmol/L (ref 22–32)
Calcium: 9 mg/dL (ref 8.9–10.3)
Chloride: 104 mmol/L (ref 98–111)
Creatinine, Ser: 1.64 mg/dL — ABNORMAL HIGH (ref 0.61–1.24)
GFR, Estimated: 51 mL/min — ABNORMAL LOW (ref >60)
Glucose, Bld: 120 mg/dL — ABNORMAL HIGH (ref 70–99)
Potassium: 4 mmol/L (ref 3.5–5.1)
Sodium: 140 mmol/L (ref 135–145)

## 2021-04-05 LAB — MAGNESIUM: Magnesium: 2.1 mg/dL (ref 1.7–2.4)

## 2021-04-19 ENCOUNTER — Ambulatory Visit: Payer: Medicare HMO | Admitting: Family

## 2021-04-21 ENCOUNTER — Encounter: Payer: Self-pay | Admitting: Family

## 2021-04-21 ENCOUNTER — Other Ambulatory Visit
Admission: RE | Admit: 2021-04-21 | Discharge: 2021-04-21 | Disposition: A | Discharge status: Home / Self Care | Payer: Medicare HMO | Source: Ambulatory Visit | Attending: Family | Admitting: Family

## 2021-04-21 ENCOUNTER — Ambulatory Visit (HOSPITAL_BASED_OUTPATIENT_CLINIC_OR_DEPARTMENT_OTHER): Payer: Medicare HMO | Admitting: Family

## 2021-04-21 ENCOUNTER — Other Ambulatory Visit: Payer: Self-pay

## 2021-04-21 VITALS — BP 127/83 | HR 80 | Resp 14 | Ht 72.0 in | Wt 268.1 lb

## 2021-04-21 DIAGNOSIS — J449 Chronic obstructive pulmonary disease, unspecified: Secondary | ICD-10-CM | POA: Diagnosis not present

## 2021-04-21 DIAGNOSIS — I10 Essential (primary) hypertension: Secondary | ICD-10-CM

## 2021-04-21 DIAGNOSIS — Z8673 Personal history of transient ischemic attack (TIA), and cerebral infarction without residual deficits: Secondary | ICD-10-CM | POA: Insufficient documentation

## 2021-04-21 DIAGNOSIS — I2510 Atherosclerotic heart disease of native coronary artery without angina pectoris: Secondary | ICD-10-CM | POA: Insufficient documentation

## 2021-04-21 DIAGNOSIS — E119 Type 2 diabetes mellitus without complications: Secondary | ICD-10-CM | POA: Diagnosis not present

## 2021-04-21 DIAGNOSIS — E785 Hyperlipidemia, unspecified: Secondary | ICD-10-CM | POA: Insufficient documentation

## 2021-04-21 DIAGNOSIS — I509 Heart failure, unspecified: Secondary | ICD-10-CM | POA: Insufficient documentation

## 2021-04-21 DIAGNOSIS — D869 Sarcoidosis, unspecified: Secondary | ICD-10-CM | POA: Insufficient documentation

## 2021-04-21 DIAGNOSIS — G4733 Obstructive sleep apnea (adult) (pediatric): Secondary | ICD-10-CM | POA: Insufficient documentation

## 2021-04-21 DIAGNOSIS — I130 Hypertensive heart and chronic kidney disease with heart failure and stage 1 through stage 4 chronic kidney disease, or unspecified chronic kidney disease: Secondary | ICD-10-CM | POA: Insufficient documentation

## 2021-04-21 DIAGNOSIS — E1122 Type 2 diabetes mellitus with diabetic chronic kidney disease: Secondary | ICD-10-CM | POA: Insufficient documentation

## 2021-04-21 DIAGNOSIS — Z87891 Personal history of nicotine dependence: Secondary | ICD-10-CM | POA: Insufficient documentation

## 2021-04-21 DIAGNOSIS — I5042 Chronic combined systolic (congestive) and diastolic (congestive) heart failure: Secondary | ICD-10-CM

## 2021-04-21 DIAGNOSIS — Z794 Long term (current) use of insulin: Secondary | ICD-10-CM

## 2021-04-21 LAB — BASIC METABOLIC PANEL
Anion gap: 9 (ref 5–15)
BUN: 17 mg/dL (ref 6–20)
CO2: 29 mmol/L (ref 22–32)
Calcium: 9.1 mg/dL (ref 8.9–10.3)
Chloride: 104 mmol/L (ref 98–111)
Creatinine, Ser: 1.5 mg/dL — ABNORMAL HIGH (ref 0.61–1.24)
GFR, Estimated: 56 mL/min — ABNORMAL LOW (ref >60)
Glucose, Bld: 148 mg/dL — ABNORMAL HIGH (ref 70–99)
Potassium: 4.1 mmol/L (ref 3.5–5.1)
Sodium: 142 mmol/L (ref 135–145)

## 2021-04-21 NOTE — Progress Notes (Signed)
? Patient ID: [name], male    DOB: 1970/10/17, 51 y.o.   MRN: [id number] ? ?HPI ? ?[name] is a 51 y/o male with a history of CVA, obstructive sleep apnea, HTN, sarcoidosis, hyperlipidemia, DM, CAD, COPD, remote tobacco use and chronic heart failure. ? ?Echo report from 11/17/2018 reviewed and showed an EF of 45-50%. Echo done 02/15/15 and showed an EF of 60% with increased wall thickness. Mild Corey along with mild TR. This is stable from a previous echo done on 08/15/14. ? ?Has not been admitted or been in the ED in the last 6 months.  ? ?He presents today for a follow-up visit with a chief complaint of minimal fatigue upon moderate exertion. He describes this as chronic in nature. He has associated cough, shortness of breath, pedal edema and slight weight gain along with this. He denies any dizziness, difficulty sleeping, abdominal distention, palpitations, chest pain or wheezing.  ? ?Taking furosemide PRN and says that he last took it ~ 3 days ago.  ? ?Continues to have his trach in place and admits that he would like it removed but recognizes that he could potentially need it in again.  ? ?Past Medical History:  ?Diagnosis Date  ? Acute on chronic respiratory failure with hypoxia (HCC)   ? CHF (congestive heart failure) ([place])   ? EF 45-50%- 2020  ? CKD (chronic kidney disease) stage 3, GFR 30-59 ml/min (HCC)   ? Clear cell renal cell carcinoma (HCC)   ? s/p partial nephrectomy  ? COPD (chronic obstructive pulmonary disease) ([place])   ? COPD, severe ([place])   ? Coronary artery disease   ? Diabetes mellitus without complication ([place])   ? Hypercholesteremia unk  ? Hypertension   ? Myocardial infarction [facility])   ? Obstructive sleep apnea   ? Paroxysmal A-fib ([place])   ? Sarcoidosis   ? Sarcoidosis, lung ([place])   ? Sleep apnea   ? does not use CPAP regularly.  ? Stroke [facility])   ? Tracheostomy status ([place])   ? ?Past Surgical History:  ?Procedure Laterality Date  ? APPENDECTOMY    ? COLONOSCOPY WITH PROPOFOL N/A  02/02/2021  ? Procedure: COLONOSCOPY WITH PROPOFOL;  Surgeon: Jonathon Bellows, MD;  [facility] ENDOSCOPY;  Service: Gastroenterology;  Laterality: N/A;  ? KNEE SURGERY Right   ? PARTIAL NEPHRECTOMY    ? TRACHEOSTOMY TUBE PLACEMENT N/A 01/14/2020  ? Procedure: TRACHEOSTOMY;  Surgeon: Clyde Canterbury, MD;  Location: [facility] ORS;  Service: ENT;  Laterality: N/A;  ? ?Family History  ?Problem Relation Age of Onset  ? Hypertension Mother   ? Heart disease Mother   ? Diabetes Mother   ? Cancer Father   ? ?Social History  ? ?Tobacco Use  ? Smoking status: Former  ?  Years: 25.00  ?  Types: Cigarettes  ?  Quit date: 12/30/2019  ?  Years since quitting: 1.3  ? Smokeless tobacco: Never  ?Substance Use Topics  ? Alcohol use: Yes  ?  Comment: occassional  ? ?Allergies  ?Allergen Reactions  ? Penicillins Anaphylaxis and Other (See Comments)  ?  Has patient had a PCN reaction causing immediate rash, facial/tongue/throat swelling, SOB or lightheadedness with hypotension: Yes ?Has patient had a PCN reaction causing severe rash involving mucus membranes or skin necrosis: No ?Has patient had a PCN reaction that required hospitalization: No ?Has patient had a PCN reaction occurring within the last 10 years: No ?If all of the above answers are "NO", then may  proceed with Cephalosporin use. ?  ? ?Prior to Admission medications   ?Medication Sig Start Date End Date Taking? Authorizing Provider  ?aspirin EC 81 MG tablet Take 81 mg by mouth daily. Swallow whole.   Yes [provider]  ?atorvastatin (LIPITOR) 40 MG tablet Take 1 tablet (40 mg total) by mouth at bedtime. 11/21/20  Yes Alisa Graff, FNP  ?ELIQUIS 5 MG TABS tablet Take 5 mg by mouth 2 (two) times daily. 07/11/20  Yes [provider]  ?empagliflozin (JARDIANCE) 10 MG TABS tablet Take 1 tablet (10 mg total) by mouth daily before breakfast. 11/11/20  Yes Alisa Graff, FNP  ?fluticasone-salmeterol (ADVAIR) 250-50 MCG/ACT AEPB Inhale 1 puff into the lungs every 12  (twelve) hours. 05/23/20  Yes [provider]  ?furosemide (LASIX) 40 MG tablet Take 1 tablet (40 mg total) by mouth daily. 09/15/20  Yes Lorella Nimrod, MD  ?ipratropium-albuterol (DUONEB) 0.5-2.5 (3) MG/3ML SOLN Take 3 mLs by nebulization every 4 (four) hours. 07/12/20  Yes [provider]  ?melatonin 5 MG TABS Take 5 mg by mouth at bedtime.   Yes [provider]  ?metoprolol tartrate (LOPRESSOR) 100 MG tablet Take 100 mg by mouth daily. 03/14/21  Yes [provider]  ?NOVOLOG FLEXPEN 100 UNIT/ML FlexPen Inject 0-16 Units into the skin in the morning, at noon, in the evening, and at bedtime. 02/19/20  Yes [provider]  ?Potassium Chloride ER 20 MEQ TBCR Take 20 mEq by mouth daily. 04/05/20  Yes [provider]  ?sacubitril-valsartan (ENTRESTO) 24-26 MG Take 1 tablet by mouth 2 (two) times daily. 09/22/20  Yes Alisa Graff, FNP  ?sildenafil (VIAGRA) 25 MG tablet Take 25 mg by mouth daily as needed for erectile dysfunction.   Yes [provider]  ?spironolactone (ALDACTONE) 25 MG tablet Take 1 tablet (25 mg total) by mouth daily. 03/22/21 06/20/21 Yes Alisa Graff, FNP  ?theophylline (THEODUR) 300 MG 12 hr tablet Take 300 mg by mouth 2 (two) times daily. 04/08/20  Yes [provider]  ?tiotropium (SPIRIVA) 18 MCG inhalation capsule Place 18 mcg into inhaler and inhale daily.   Yes [provider]  ?VENTOLIN HFA 108 (90 Base) MCG/ACT inhaler Inhale 2 puffs into the lungs every 6 (six) hours as needed for wheezing or shortness of breath. 05/25/20  Yes [provider]  ? ? ?Review of Systems  ?Constitutional:  Positive for fatigue. Negative for appetite change.  ?HENT:  Negative for congestion, postnasal drip and sore throat.   ?Eyes: Negative.   ?Respiratory:  Positive for cough and shortness of breath. Negative for chest tightness and wheezing.   ?Cardiovascular:  Positive for leg swelling. Negative for chest pain and palpitations.   ?Gastrointestinal:  Negative for abdominal distention and abdominal pain.  ?Endocrine: Negative.   ?Genitourinary: Negative.   ?Musculoskeletal:  Negative for back pain.  ?Skin: Negative.   ?Allergic/Immunologic: Negative.   ?Neurological:  Negative for dizziness and light-headedness.  ?Hematological:  Negative for adenopathy. Does not bruise/bleed easily.  ?Psychiatric/Behavioral:  Negative for dysphoric mood and sleep disturbance (wearing NIV at night connected to trach). The patient is not nervous/anxious.   ? ?Vitals:  ? 04/21/21 1105  ?BP: 127/83  ?Pulse: 80  ?Resp: 14  ?SpO2: 100%  ?Weight: 268 lb 2 oz (121.6 kg)  ?Height: 6' (1.829 m)  ? ?Wt Readings from Last 3 Encounters:  ?04/21/21 268 lb 2 oz (121.6 kg)  ?03/22/21 263 lb 4 oz (119.4 kg)  ?02/02/21 265  lb (120.2 kg)  ? ?Lab Results  ?Component Value Date  ? CREATININE 1.64 (H) 04/05/2021  ? CREATININE 1.25 (H) 12/22/2020  ? CREATININE 1.59 (H) 10/04/2020  ? ?Physical Exam ?Vitals and nursing note reviewed.  ?Constitutional:   ?   Appearance: He is well-developed.  ?HENT:  ?   Head: Normocephalic and atraumatic.  ?Neck:  ?   Vascular: No JVD.  ?Cardiovascular:  ?   Rate and Rhythm: Normal rate and regular rhythm.  ?Pulmonary:  ?   Effort: Pulmonary effort is normal.  ?   Breath sounds: No wheezing or rales.  ?Abdominal:  ?   General: There is no distension.  ?   Palpations: Abdomen is soft.  ?   Tenderness: There is no abdominal tenderness.  ?Musculoskeletal:     ?   General: No tenderness.  ?   Cervical back: Normal range of motion and neck supple.  ?   Right lower leg: No tenderness. Edema (1+ pitting) present.  ?   Left lower leg: No tenderness. Edema (1+ pitting) present.  ?Skin: ?   General: Skin is warm and dry.  ?Neurological:  ?   Mental Status: He is alert and oriented to person, place, and time.  ?Psychiatric:     ?   Behavior: Behavior normal.     ?   Thought Content: Thought content normal.  ? ? ?Assessment & Plan: ? ?1: Chronic heart failure  with mildly reduced ejection fraction- ?- NYHA class II ?- euvolemic today ?- weighing daily; Reminded to call for an overnight weight gain of >2 pounds or a weekly weight gain of >5 pounds ?- weight up 5 pounds fro

## 2021-04-21 NOTE — Patient Instructions (Signed)
Continue weighing daily and call for an overnight weight gain of 3 pounds or more or a weekly weight gain of more than 5 pounds.   If you have voicemail, please make sure your mailbox is cleaned out so that we may leave a message and please make sure to listen to any voicemails.     

## 2021-07-06 ENCOUNTER — Other Ambulatory Visit: Payer: Self-pay

## 2021-07-06 MED ORDER — SUTAB 1479-225-188 MG PO TABS: 1.0000 | ORAL_TABLET | Freq: Once | ORAL | 0 refills | Status: AC

## 2021-07-06 NOTE — Progress Notes (Signed)
Patient contacted office to request Sutab Bowel Prep due to inability to tolerate liquid preps in the past.

## 2021-07-19 ENCOUNTER — Telehealth: Payer: Self-pay

## 2021-07-19 ENCOUNTER — Telehealth: Payer: Self-pay | Admitting: Gastroenterology

## 2021-07-19 MED ORDER — SUTAB 1479-225-188 MG PO TABS: 12.0000 | ORAL_TABLET | Freq: Once | ORAL | 0 refills | Status: AC

## 2021-07-19 NOTE — Telephone Encounter (Signed)
Sent in prep pills

## 2021-07-19 NOTE — Telephone Encounter (Signed)
Patient requesting a call back, has some questions in reference to his colonoscopy.

## 2021-07-22 ENCOUNTER — Other Ambulatory Visit: Payer: Self-pay | Admitting: Family

## 2021-07-23 MED ORDER — SPIRONOLACTONE 25 MG PO TABS: 25.0000 mg | ORAL_TABLET | Freq: Every day | ORAL | 3 refills | Status: AC

## 2021-07-25 ENCOUNTER — Telehealth: Payer: Self-pay

## 2021-07-25 MED ORDER — PEG 3350-KCL-NA BICARB-NACL 420 G PO SOLR
4000.0000 mL | Freq: Once | ORAL | 0 refills | Status: AC
Start: 2021-07-25 — End: 2021-07-25

## 2021-07-25 NOTE — Telephone Encounter (Signed)
Sent in different prep

## 2021-07-26 ENCOUNTER — Telehealth: Payer: Self-pay

## 2021-07-26 NOTE — Telephone Encounter (Signed)
ANSWERED PATIENTS QUESTION AND LET HIM KNOW SUTAB WAS ALREADY SENT IN FOR HIM

## 2021-07-26 NOTE — Telephone Encounter (Signed)
Rescheduled patients appointment   sent new letters and new referral and called endo

## 2021-07-26 NOTE — Telephone Encounter (Signed)
Patient left 2 voicemail's stating that he got the powder prep and can not take this kind of prep he states he needs the Sutab called in for him. Please call patient back and his procedure is Friday

## 2021-08-01 NOTE — Progress Notes (Signed)
Patient ID: [name], male    DOB: 1970/03/10, 50 y.o.   MRN: [id number]  HPI  [name] is a 51 y/o male with a history of CVA, obstructive sleep apnea, HTN, sarcoidosis, hyperlipidemia, DM, CAD, COPD, remote tobacco use and chronic heart failure.  Echo report from 11/17/2018 reviewed and showed an EF of 45-50%. Echo done 02/15/15 and showed an EF of 60% with increased wall thickness. Mild Corey along with mild TR. This is stable from a previous echo done on 08/15/14.  Has not been admitted or been in the ED in the last 6 months.   He presents today for a follow-up visit with a chief complaint of minimal shortness of breath upon moderate exertion. Describes this as chronic in nature. He has associated fatigue, cough and pedal edema along with this. Denies any dizziness, difficulty sleeping, abdominal distention, palpitations, chest pain, wheezing or weight gain.   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 Says that he's been taking metoprolol tartrate '100mg'$  once daily.   Hasn't taken his furosemide for several weeks although has continued to take his potassium supplement daily.   Past Medical History:  Diagnosis Date   Acute on chronic respiratory failure with hypoxia (HCC)    CHF (congestive heart failure) (HCC)    EF 45-50%- 2020   CKD (chronic kidney disease) stage 3, GFR 30-59 ml/min (HCC)    Clear cell renal cell carcinoma (HCC)    s/p partial nephrectomy   COPD (chronic obstructive pulmonary disease) (HCC)    COPD, severe (HCC)    Coronary artery disease    Diabetes mellitus without complication (HCC)    Hypercholesteremia unk   Hypertension    Myocardial infarction ([place])    Obstructive sleep apnea    Paroxysmal A-fib (HCC)    Sarcoidosis    Sarcoidosis, lung (HCC)    Sleep apnea    does not use CPAP regularly.   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[facility])    Past Surgical History:  Procedure Laterality Date   APPENDECTOMY      COLONOSCOPY WITH PROPOFOL N/A 02/02/2021   Procedure: COLONOSCOPY WITH PROPOFOL;  Surgeon: Jonathon Bellows, MD;  Location: [facility] ENDOSCOPY;  Service: Gastroenterology;  Laterality: N/A;   KNEE SURGERY Right    PARTIAL NEPHRECTOMY     TRACHEOSTOMY TUBE PLACEMENT N/A 01/14/2020   Procedure: TRACHEOSTOMY;  Surgeon: Clyde Canterbury, MD;  Location: [facility] ORS;  Service: ENT;  Laterality: N/A;   Family History  Problem Relation Age of Onset   Hypertension Mother    Heart disease Mother    Diabetes Mother    Cancer Father    Social History   Tobacco Use   Smoking status: Former    Years: 25.00    Types: Cigarettes    Quit date: 12/30/2019    Years since quitting: 1.5   Smokeless tobacco: Never  Substance Use Topics   Alcohol use: Yes    Comment: occassional   Allergies  Allergen Reactions   Penicillins Anaphylaxis and Other (See Comments)    Has patient had a PCN reaction causing immediate rash, facial/tongue/throat swelling, SOB or lightheadedness with hypotension: Yes Has patient had a PCN reaction causing severe rash involving mucus membranes or skin necrosis: No Has patient had a PCN reaction that required hospitalization: No Has patient had a PCN reaction occurring within the last 10 years: No If all of the above answers are "NO", then  may proceed with Cephalosporin use.    Prior to Admission medications   Medication Sig Start Date End Date Taking? Authorizing Provider  aspirin EC 81 MG tablet Take 81 mg by mouth daily. Swallow whole.   Yes [provider]  atorvastatin (LIPITOR) 40 MG tablet Take 1 tablet (40 mg total) by mouth at bedtime. 11/21/20  Yes Elysa Womac A, FNP  ELIQUIS 5 MG TABS tablet Take 5 mg by mouth 2 (two) times daily. 07/11/20  Yes [provider]  empagliflozin (JARDIANCE) 10 MG TABS tablet Take 1 tablet (10 mg total) by mouth daily before breakfast. 11/11/20  Yes Peggye Poon A, FNP  fluticasone-salmeterol (ADVAIR) 250-50 MCG/ACT AEPB Inhale  1 puff into the lungs every 12 (twelve) hours. 05/23/20  Yes [provider]  furosemide (LASIX) 40 MG tablet Take 1 tablet (40 mg total) by mouth daily. Patient taking differently: Take 40 mg by mouth daily. Takes as needed 09/15/20  Yes Lorella Nimrod, MD  ipratropium-albuterol (DUONEB) 0.5-2.5 (3) MG/3ML SOLN Take 3 mLs by nebulization every 4 (four) hours. 07/12/20  Yes [provider]  melatonin 5 MG TABS Take 5 mg by mouth at bedtime.   Yes [provider]  metoprolol succinate (TOPROL-XL) 100 MG 24 hr tablet Take 1 tablet (100 mg total) by mouth daily. Take with or immediately following a meal. 08/02/21  Yes Al Gagen A, FNP  NOVOLOG FLEXPEN 100 UNIT/ML FlexPen Inject 0-16 Units into the skin in the morning, at noon, in the evening, and at bedtime. 02/19/20  Yes [provider]  Potassium Chloride ER 20 MEQ TBCR Take 20 mEq by mouth daily. 04/05/20  Yes [provider]  sacubitril-valsartan (ENTRESTO) 24-26 MG Take 1 tablet by mouth 2 (two) times daily. 09/22/20  Yes Darylene Price A, FNP  sildenafil (VIAGRA) 25 MG tablet Take 25 mg by mouth daily as needed for erectile dysfunction.   Yes [provider]  spironolactone (ALDACTONE) 25 MG tablet Take 1 tablet (25 mg total) by mouth daily. 07/23/21  Yes Jerardo Costabile A, FNP  theophylline (THEODUR) 300 MG 12 hr tablet Take 300 mg by mouth 2 (two) times daily. 04/08/20  Yes [provider]  tiotropium (SPIRIVA) 18 MCG inhalation capsule Place 18 mcg into inhaler and inhale daily.   Yes [provider]  VENTOLIN HFA 108 (90 Base) MCG/ACT inhaler Inhale 2 puffs into the lungs every 6 (six) hours as needed for wheezing or shortness of breath. 05/25/20  Yes [provider]    Review of Systems  Constitutional:  Positive for fatigue. Negative for appetite change.  HENT:  Negative for congestion, postnasal drip and sore throat.   Eyes: Negative.   Respiratory:  Positive for cough  and shortness of breath. Negative for chest tightness and wheezing.   Cardiovascular:  Positive for leg swelling. Negative for chest pain and palpitations.  Gastrointestinal:  Negative for abdominal distention and abdominal pain.  Endocrine: Negative.   Genitourinary: Negative.   Musculoskeletal:  Negative for back pain.  Skin: Negative.   Allergic/Immunologic: Negative.   Neurological:  Negative for dizziness and light-headedness.  Hematological:  Negative for adenopathy. Does not bruise/bleed easily.  Psychiatric/Behavioral:  Negative for dysphoric mood and sleep disturbance (wearing NIV at night connected to trach). The patient is not nervous/anxious.    Vitals:   08/02/21 0822  BP: 140/83  Pulse: 77  Resp: 20  SpO2: 98%  Weight: 261 lb 8 oz (118.6 kg)  Height: '6\' 1"'$  (1.854 m)  Wt Readings from Last 3 Encounters:  08/02/21 261 lb 8 oz (118.6 kg)  04/21/21 268 lb 2 oz (121.6 kg)  03/22/21 263 lb 4 oz (119.4 kg)   Lab Results  Component Value Date   CREATININE 1.50 (H) 04/21/2021   CREATININE 1.64 (H) 04/05/2021   CREATININE 1.25 (H) 12/22/2020   Physical Exam Vitals and nursing note reviewed.  Constitutional:      Appearance: He is well-developed.  HENT:     Head: Normocephalic and atraumatic.  Neck:     Vascular: No JVD.  Cardiovascular:     Rate and Rhythm: Normal rate and regular rhythm.  Pulmonary:     Effort: Pulmonary effort is normal.     Breath sounds: No wheezing or rales.  Abdominal:     General: There is no distension.     Palpations: Abdomen is soft.     Tenderness: There is no abdominal tenderness.  Musculoskeletal:        General: No tenderness.     Cervical back: Normal range of motion and neck supple.     Right lower leg: No tenderness. Edema (1+ pitting) present.     Left lower leg: No tenderness. Edema (1+ pitting) present.  Skin:    General: Skin is warm and dry.  Neurological:     Mental Status: He is alert and oriented to person, place,  and time.  Psychiatric:        Behavior: Behavior normal.        Thought Content: Thought content normal.   Assessment & Plan:  1: Chronic heart failure with mildly reduced ejection fraction- - NYHA class II - euvolemic today - not weighing daily but does have working scales; Reminded to call for an overnight weight gain of >2 pounds or a weekly weight gain of >5 pounds - weight down 7 pounds from last visit here 3 months ago; continues to work on his weight loss - drinking about 4-6 bottles of water daily.  - cooking with salt but not adding to food. Encouraged to not add salt at atll - saw cardiology ([place]) 02/15/21 - on GDMT of metoprolol (although tartrate), jardiance, spironolactone and entresto - will change his metoprolol to succinate '100mg'$  daily; advised to stop his current metoprolol when he picks up the new RX - last echo done in 2020; need to get this updated after med titration completed; order entered today - hasn't taken his diuretic in several weeks although has continued his daily potassium; will check BMP today - may be able to stop daily potassium supplement as he takes daily spironolactone; possibly increase entresto as well pending lab results - BNP 09/01/20 was 113.9 - PharmD reconciled medications with patient  2: HTN- - BP mildly elevated (140/83) but he hasn't taken any of his medications yet today as 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 Each medication was verbally reviewed with the patient and he was encouraged to bring the bottles to every visit to confirm accuracy of list.  Return  in 1 month, sooner if needed

## 2021-08-02 ENCOUNTER — Encounter: Payer: Self-pay | Admitting: Family

## 2021-08-02 ENCOUNTER — Ambulatory Visit: Payer: Medicare HMO | Attending: Family | Admitting: Family

## 2021-08-02 ENCOUNTER — Encounter: Payer: Self-pay | Admitting: Pharmacist

## 2021-08-02 VITALS — BP 140/83 | HR 77 | Resp 20 | Ht 73.0 in | Wt 261.5 lb

## 2021-08-02 DIAGNOSIS — J449 Chronic obstructive pulmonary disease, unspecified: Secondary | ICD-10-CM

## 2021-08-02 DIAGNOSIS — E785 Hyperlipidemia, unspecified: Secondary | ICD-10-CM | POA: Diagnosis not present

## 2021-08-02 DIAGNOSIS — I5022 Chronic systolic (congestive) heart failure: Secondary | ICD-10-CM | POA: Insufficient documentation

## 2021-08-02 DIAGNOSIS — I110 Hypertensive heart disease with heart failure: Secondary | ICD-10-CM | POA: Diagnosis not present

## 2021-08-02 DIAGNOSIS — G4733 Obstructive sleep apnea (adult) (pediatric): Secondary | ICD-10-CM | POA: Insufficient documentation

## 2021-08-02 DIAGNOSIS — Z8673 Personal history of transient ischemic attack (TIA), and cerebral infarction without residual deficits: Secondary | ICD-10-CM | POA: Diagnosis not present

## 2021-08-02 DIAGNOSIS — I081 Rheumatic disorders of both mitral and tricuspid valves: Secondary | ICD-10-CM | POA: Insufficient documentation

## 2021-08-02 DIAGNOSIS — E119 Type 2 diabetes mellitus without complications: Secondary | ICD-10-CM

## 2021-08-02 DIAGNOSIS — Z79899 Other long term (current) drug therapy: Secondary | ICD-10-CM | POA: Diagnosis not present

## 2021-08-02 DIAGNOSIS — I5042 Chronic combined systolic (congestive) and diastolic (congestive) heart failure: Secondary | ICD-10-CM

## 2021-08-02 DIAGNOSIS — I2510 Atherosclerotic heart disease of native coronary artery without angina pectoris: Secondary | ICD-10-CM | POA: Diagnosis not present

## 2021-08-02 DIAGNOSIS — I10 Essential (primary) hypertension: Secondary | ICD-10-CM

## 2021-08-02 DIAGNOSIS — Z794 Long term (current) use of insulin: Secondary | ICD-10-CM

## 2021-08-02 DIAGNOSIS — Z87891 Personal history of nicotine dependence: Secondary | ICD-10-CM | POA: Diagnosis not present

## 2021-08-02 LAB — BASIC METABOLIC PANEL
Anion gap: 3 — ABNORMAL LOW (ref 5–15)
BUN: 22 mg/dL — ABNORMAL HIGH (ref 6–20)
CO2: 29 mmol/L (ref 22–32)
Calcium: 8.8 mg/dL — ABNORMAL LOW (ref 8.9–10.3)
Chloride: 110 mmol/L (ref 98–111)
Creatinine, Ser: 1.58 mg/dL — ABNORMAL HIGH (ref 0.61–1.24)
GFR, Estimated: 53 mL/min — ABNORMAL LOW (ref >60)
Glucose, Bld: 107 mg/dL — ABNORMAL HIGH (ref 70–99)
Potassium: 4.5 mmol/L (ref 3.5–5.1)
Sodium: 142 mmol/L (ref 135–145)

## 2021-08-02 MED ORDER — METOPROLOL SUCCINATE ER 100 MG PO TB24: 100.0000 mg | ORAL_TABLET | Freq: Every day | ORAL | 3 refills | Status: AC

## 2021-08-02 NOTE — Progress Notes (Signed)
[place] - PHARMACIST COUNSELING NOTE  Guideline-Directed Medical Therapy/Evidence Based Medicine  ACE/ARB/ARNI: Sacubitril-valsartan 24-26 mg twice daily Beta Blocker: Metoprolol tartrate '100mg'$  daily Aldosterone Antagonist: Spironolactone 25 mg daily Diuretic: Furosemide 40 mg daily SGLT2i: Empagliflozin 10 mg daily  Adherence Assessment  Do you ever forget to take your medication? '[]'$ Yes '[x]'$ No  Do you ever skip doses due to side effects? '[]'$ Yes '[x]'$ No  Do you have trouble affording your medicines? '[]'$ Yes '[x]'$ No  Are you ever unable to pick up your medication due to transportation difficulties? '[]'$ Yes '[x]'$ No  Do you ever stop taking your medications because you don't believe they are helping? '[]'$ Yes '[x]'$ No  Do you check your weight daily? '[x]'$ Yes '[]'$ No   Adherence strategy: takes med at 8am and 8pm - no pill box  Barriers to obtaining medications: none  Vital signs: HR 77, BP 140/83, weight (pounds) 261 lbs ECHO: Date 11/17/18, EF 45-50%, notes Mild MR along with mild TR     Latest Ref Rng & Units 04/21/2021   11:45 AM 04/05/2021    8:51 AM 12/22/2020    1:30 PM  BMP  Glucose 70 - 99 mg/dL 148  120  123   BUN 6 - 20 mg/dL '17  17  17   '$ Creatinine 0.61 - 1.24 mg/dL 1.50  1.64  1.25   Sodium 135 - 145 mmol/L 142  140  141   Potassium 3.5 - 5.1 mmol/L 4.1  4.0  3.9   Chloride 98 - 111 mmol/L 104  104  102   CO2 22 - 32 mmol/L 29  27  32   Calcium 8.9 - 10.3 mg/dL 9.1  9.0  8.5     Past Medical History:  Diagnosis Date   Acute on chronic respiratory failure with hypoxia (HCC)    CHF (congestive heart failure) (HCC)    EF 45-50%- 2020   CKD (chronic kidney disease) stage 3, GFR 30-59 ml/min (HCC)    Clear cell renal cell carcinoma (HCC)    s/p partial nephrectomy   COPD (chronic obstructive pulmonary disease) (HCC)    COPD, severe (HCC)    Coronary artery disease    Diabetes mellitus without complication (HCC)     Hypercholesteremia unk   Hypertension    Myocardial infarction ([place])    Obstructive sleep apnea    Paroxysmal A-fib (HCC)    Sarcoidosis    Sarcoidosis, lung (HCC)    Sleep apnea    does not use CPAP regularly.   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 PMH includes COPD, CKD stage 3, CAD, uncontrolled HTN, A.Fib, and chronic HF. Noted scheduled colonoscopy for 09/01/2021, and no ED visits in the last 6 months.   Patient denies dizziness, increase headaches or edema.Reports home BP is never > 161 systolic , but remains between 130s-150s. No need to take furosemide this week yet, snf not meds this AM yet. Usually takes medication 8am and 8pm, as he still working 3rd shift.  PLAN (recommendations) 1, Change metoprolol tartrate to metoprolol succinate '100mg'$  daily 2. Repeat BMET 3. IF BMET stable and K at goal, will dc potassium supplement and increase Entresto from 24/'26mg'$  to 49/'51mg'$  twice daily.   Time spent: 20 minutes  Coy Rochford Rodriguez-Guzman PharmD, BCPS 08/02/2021 8:45 AM     Current Outpatient Medications:    aspirin EC 81 MG tablet, Take 81 mg  by mouth daily. Swallow whole., Disp: , Rfl:    atorvastatin (LIPITOR) 40 MG tablet, Take 1 tablet (40 mg total) by mouth at bedtime., Disp: 90 tablet, Rfl: 3   ELIQUIS 5 MG TABS tablet, Take 5 mg by mouth 2 (two) times daily., Disp: , Rfl:    empagliflozin (JARDIANCE) 10 MG TABS tablet, Take 1 tablet (10 mg total) by mouth daily before breakfast., Disp: 30 tablet, Rfl: 5   fluticasone-salmeterol (ADVAIR) 250-50 MCG/ACT AEPB, Inhale 1 puff into the lungs every 12 (twelve) hours., Disp: , Rfl:    furosemide (LASIX) 40 MG tablet, Take 1 tablet (40 mg total) by mouth daily. (Patient taking differently: Take 40 mg by mouth daily. Takes as needed), Disp: 30 tablet, Rfl: 1   ipratropium-albuterol (DUONEB) 0.5-2.5 (3) MG/3ML SOLN, Take 3 mLs by nebulization every 4  (four) hours., Disp: , Rfl:    melatonin 5 MG TABS, Take 5 mg by mouth at bedtime., Disp: , Rfl:    metoprolol succinate (TOPROL-XL) 100 MG 24 hr tablet, Take 1 tablet (100 mg total) by mouth daily. Take with or immediately following a meal., Disp: 90 tablet, Rfl: 3   NOVOLOG FLEXPEN 100 UNIT/ML FlexPen, Inject 0-16 Units into the skin in the morning, at noon, in the evening, and at bedtime., Disp: , Rfl:    Potassium Chloride ER 20 MEQ TBCR, Take 20 mEq by mouth daily., Disp: , Rfl:    sacubitril-valsartan (ENTRESTO) 24-26 MG, Take 1 tablet by mouth 2 (two) times daily., Disp: 60 tablet, Rfl: 3   sildenafil (VIAGRA) 25 MG tablet, Take 25 mg by mouth daily as needed for erectile dysfunction., Disp: , Rfl:    spironolactone (ALDACTONE) 25 MG tablet, Take 1 tablet (25 mg total) by mouth daily., Disp: 90 tablet, Rfl: 3   theophylline (THEODUR) 300 MG 12 hr tablet, Take 300 mg by mouth 2 (two) times daily., Disp: , Rfl:    tiotropium (SPIRIVA) 18 MCG inhalation capsule, Place 18 mcg into inhaler and inhale daily., Disp: , Rfl:    VENTOLIN HFA 108 (90 Base) MCG/ACT inhaler, Inhale 2 puffs into the lungs every 6 (six) hours as needed for wheezing or shortness of breath., Disp: , Rfl:

## 2021-08-02 NOTE — Patient Instructions (Addendum)
Resume weighing daily and call for an overnight weight gain of 3 pounds or more or a weekly weight gain of more than 5 pounds.   If you have voicemail, please make sure your mailbox is cleaned out so that we may leave a message and please make sure to listen to any voicemails.    If you receive a satisfaction survey regarding the Heart Failure Clinic, please take the time to fill it out. This way we can continue to provide excellent care and make any changes that need to be made.     When you pick up your new prescription of Metoprolol succinate '100mg'$  you will stop taking your current metoprolol tartrate dose.

## 2021-08-03 ENCOUNTER — Telehealth: Payer: Self-pay

## 2021-08-03 NOTE — Telephone Encounter (Addendum)
Left detailed message per patient with lab results and message below. ----- Message from Alisa Graff, FNP sent at 08/03/2021  8:28 AM EDT ----- Potassium level is normal so continue your potassium tablets. Kidney function is within your range but continue your current entresto dose at this time. Will recheck the labs again in 1 month

## 2021-08-18 ENCOUNTER — Other Ambulatory Visit: Payer: Self-pay | Admitting: Family

## 2021-08-18 MED ORDER — SACUBITRIL-VALSARTAN 24-26 MG PO TABS: 1.0000 | ORAL_TABLET | Freq: Two times a day (BID) | ORAL | 5 refills | Status: AC

## 2021-08-28 ENCOUNTER — Other Ambulatory Visit: Payer: Self-pay

## 2021-08-28 MED ORDER — CLENPIQ 10-3.5-12 MG-GM -GM/160ML PO SOLN: ORAL | 0 refills | Status: DC

## 2021-08-31 ENCOUNTER — Ambulatory Visit: Payer: Medicare HMO | Admitting: Family

## 2021-08-31 ENCOUNTER — Telehealth: Payer: Self-pay | Admitting: Family

## 2021-08-31 NOTE — Telephone Encounter (Signed)
Patient did not show for his Heart Failure Clinic appointment on 08/31/21. Will attempt to reschedule.

## 2021-08-31 NOTE — Progress Notes (Deleted)
Patient ID: [name], male    DOB: 1970/10/09, 51 y.o.   MRN: [id number]  HPI  Mr Eakle is a 51 y/o male with a history of CVA, obstructive sleep apnea, HTN, sarcoidosis, hyperlipidemia, DM, CAD, COPD, remote tobacco use and chronic heart failure.  Echo report from 11/17/2018 reviewed and showed an EF of 45-50%. Echo done 02/15/15 and showed an EF of 60% with increased wall thickness. Mild MR along with mild TR. This is stable from a previous echo done on 08/15/14.  Has not been admitted or been in the ED in the last 6 months.   He presents today for a follow-up visit with a chief complaint of    Past Medical History:  Diagnosis Date   Acute on chronic respiratory failure with hypoxia (HCC)    CHF (congestive heart failure) (HCC)    EF 45-50%- 2020   CKD (chronic kidney disease) stage 3, GFR 30-59 ml/min (HCC)    Clear cell renal cell carcinoma (HCC)    s/p partial nephrectomy   COPD (chronic obstructive pulmonary disease) (HCC)    COPD, severe (HCC)    Coronary artery disease    Diabetes mellitus without complication (HCC)    Hypercholesteremia unk   Hypertension    Myocardial infarction ([place])    Obstructive sleep apnea    Paroxysmal A-fib (HCC)    Sarcoidosis    Sarcoidosis, lung (HCC)    Sleep apnea    does not use CPAP regularly.   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[facility])    Past Surgical History:  Procedure Laterality Date   APPENDECTOMY     COLONOSCOPY WITH PROPOFOL N/A 02/02/2021   Procedure: COLONOSCOPY WITH PROPOFOL;  Surgeon: Jonathon Bellows, MD;  Location: [facility] ENDOSCOPY;  Service: Gastroenterology;  Laterality: N/A;   KNEE SURGERY Right    PARTIAL NEPHRECTOMY     TRACHEOSTOMY TUBE PLACEMENT N/A 01/14/2020   Procedure: TRACHEOSTOMY;  Surgeon: Clyde Canterbury, MD;  Location: [facility] ORS;  Service: ENT;  Laterality: N/A;   Family History  Problem Relation Age of Onset   Hypertension Mother    Heart disease Mother    Diabetes Mother    Cancer Father     Social History   Tobacco Use   Smoking status: Former    Years: 25.00    Types: Cigarettes    Quit date: 12/30/2019    Years since quitting: 1.6   Smokeless tobacco: Never  Substance Use Topics   Alcohol use: Yes    Comment: occassional   Allergies  Allergen Reactions   Penicillins Anaphylaxis and Other (See Comments)    Has patient had a PCN reaction causing immediate rash, facial/tongue/throat swelling, SOB or lightheadedness with hypotension: Yes Has patient had a PCN reaction causing severe rash involving mucus membranes or skin necrosis: No Has patient had a PCN reaction that required hospitalization: No Has patient had a PCN reaction occurring within the last 10 years: No If all of the above answers are "NO", then may proceed with Cephalosporin use.      Review of Systems  Constitutional:  Positive for fatigue. Negative for appetite change.  HENT:  Negative for congestion, postnasal drip and sore throat.   Eyes: Negative.   Respiratory:  Positive for cough and shortness of breath. Negative for chest tightness and wheezing.   Cardiovascular:  Positive for leg swelling. Negative for chest pain and palpitations.  Gastrointestinal:  Negative for abdominal distention and abdominal pain.  Endocrine: Negative.  Genitourinary: Negative.   Musculoskeletal:  Negative for back pain.  Skin: Negative.   Allergic/Immunologic: Negative.   Neurological:  Negative for dizziness and light-headedness.  Hematological:  Negative for adenopathy. Does not bruise/bleed easily.  Psychiatric/Behavioral:  Negative for dysphoric mood and sleep disturbance (wearing NIV at night connected to trach). The patient is not nervous/anxious.      Physical Exam Vitals and nursing note reviewed.  Constitutional:      Appearance: He is well-developed.  HENT:     Head: Normocephalic and atraumatic.  Neck:     Vascular: No JVD.  Cardiovascular:     Rate and Rhythm: Normal rate and regular  rhythm.  Pulmonary:     Effort: Pulmonary effort is normal.     Breath sounds: No wheezing or rales.  Abdominal:     General: There is no distension.     Palpations: Abdomen is soft.     Tenderness: There is no abdominal tenderness.  Musculoskeletal:        General: No tenderness.     Cervical back: Normal range of motion and neck supple.     Right lower leg: No tenderness. Edema (1+ pitting) present.     Left lower leg: No tenderness. Edema (1+ pitting) present.  Skin:    General: Skin is warm and dry.  Neurological:     Mental Status: He is alert and oriented to person, place, and time.  Psychiatric:        Behavior: Behavior normal.        Thought Content: Thought content normal.   Assessment & Plan:  1: Chronic heart failure with mildly reduced ejection fraction- - NYHA class II - euvolemic today - not weighing daily but does have working scales; Reminded to call for an overnight weight gain of >2 pounds or a weekly weight gain of >5 pounds - weight 261.8 pounds from last visit here 1 month ago - drinking about 4-6 bottles of water daily.  - cooking with salt but not adding to food. Encouraged to not add salt at atll - saw cardiology ([place]) 08/04/21 - on GDMT of metoprolol succinate, jardiance, spironolactone and entresto - last echo done in 2020; need to get this updated after med titration completed - BNP 09/01/20 was 113.9   2: HTN- - BP  - follows with PCP @ [facility] ([facility])  - BMP 08/02/21 reviewed and showed sodium 142, potassium 4.5 creatinine 1.58 and GFR 53 - recheck labs today  3: COPD-  - using nocturnal ventilator at night through his trach - saw pulmonologist [name]) 07/25/21  4: Diabetes- - A1c was 6.6% on 01/28/20 - saw nephrology Smith Mince) 06/15/21   Patient did not bring his medications nor a list. Each medication was verbally reviewed with the patient and he was encouraged to bring the bottles to every visit to confirm accuracy  of list.

## 2021-09-01 ENCOUNTER — Other Ambulatory Visit: Payer: Self-pay

## 2021-09-01 ENCOUNTER — Ambulatory Visit: Payer: Medicare HMO | Admitting: Certified Registered Nurse Anesthetist

## 2021-09-01 ENCOUNTER — Ambulatory Visit
Admission: RE | Admit: 2021-09-01 | Discharge: 2021-09-01 | Disposition: A | Discharge status: Home / Self Care | Payer: Medicare HMO | Attending: Gastroenterology | Admitting: Gastroenterology

## 2021-09-01 ENCOUNTER — Encounter: Payer: Self-pay | Admitting: Gastroenterology

## 2021-09-01 ENCOUNTER — Encounter
Admission: RE | Disposition: A | Discharge status: Home / Self Care | Payer: Self-pay | Source: Home / Self Care | Attending: Gastroenterology

## 2021-09-01 DIAGNOSIS — Z6834 Body mass index (BMI) 34.0-34.9, adult: Secondary | ICD-10-CM | POA: Insufficient documentation

## 2021-09-01 DIAGNOSIS — Z8673 Personal history of transient ischemic attack (TIA), and cerebral infarction without residual deficits: Secondary | ICD-10-CM | POA: Diagnosis not present

## 2021-09-01 DIAGNOSIS — I480 Paroxysmal atrial fibrillation: Secondary | ICD-10-CM | POA: Insufficient documentation

## 2021-09-01 DIAGNOSIS — N183 Chronic kidney disease, stage 3 unspecified: Secondary | ICD-10-CM | POA: Diagnosis not present

## 2021-09-01 DIAGNOSIS — I252 Old myocardial infarction: Secondary | ICD-10-CM | POA: Diagnosis not present

## 2021-09-01 DIAGNOSIS — E7800 Pure hypercholesterolemia, unspecified: Secondary | ICD-10-CM | POA: Insufficient documentation

## 2021-09-01 DIAGNOSIS — Z1211 Encounter for screening for malignant neoplasm of colon: Secondary | ICD-10-CM | POA: Insufficient documentation

## 2021-09-01 DIAGNOSIS — Z930 Tracheostomy status: Secondary | ICD-10-CM | POA: Diagnosis not present

## 2021-09-01 DIAGNOSIS — J449 Chronic obstructive pulmonary disease, unspecified: Secondary | ICD-10-CM | POA: Insufficient documentation

## 2021-09-01 DIAGNOSIS — I509 Heart failure, unspecified: Secondary | ICD-10-CM | POA: Diagnosis not present

## 2021-09-01 DIAGNOSIS — I130 Hypertensive heart and chronic kidney disease with heart failure and stage 1 through stage 4 chronic kidney disease, or unspecified chronic kidney disease: Secondary | ICD-10-CM | POA: Diagnosis not present

## 2021-09-01 DIAGNOSIS — Z87891 Personal history of nicotine dependence: Secondary | ICD-10-CM | POA: Diagnosis not present

## 2021-09-01 DIAGNOSIS — G4733 Obstructive sleep apnea (adult) (pediatric): Secondary | ICD-10-CM | POA: Diagnosis not present

## 2021-09-01 DIAGNOSIS — I2510 Atherosclerotic heart disease of native coronary artery without angina pectoris: Secondary | ICD-10-CM | POA: Insufficient documentation

## 2021-09-01 DIAGNOSIS — Z85528 Personal history of other malignant neoplasm of kidney: Secondary | ICD-10-CM | POA: Insufficient documentation

## 2021-09-01 DIAGNOSIS — Z86010 Personal history of colonic polyps: Secondary | ICD-10-CM | POA: Diagnosis not present

## 2021-09-01 DIAGNOSIS — D869 Sarcoidosis, unspecified: Secondary | ICD-10-CM | POA: Insufficient documentation

## 2021-09-01 DIAGNOSIS — E1122 Type 2 diabetes mellitus with diabetic chronic kidney disease: Secondary | ICD-10-CM | POA: Insufficient documentation

## 2021-09-01 DIAGNOSIS — D122 Benign neoplasm of ascending colon: Secondary | ICD-10-CM | POA: Diagnosis not present

## 2021-09-01 HISTORY — PX: COLONOSCOPY WITH PROPOFOL: SHX5780

## 2021-09-01 SURGERY — COLONOSCOPY WITH PROPOFOL
Anesthesia: General

## 2021-09-01 MED ORDER — SODIUM CHLORIDE 0.9 % IV SOLN: INTRAVENOUS | Status: DC

## 2021-09-01 MED ORDER — LIDOCAINE HCL (CARDIAC) PF 100 MG/5ML IV SOSY
PREFILLED_SYRINGE | INTRAVENOUS | Status: DC | PRN
  Administered 2021-09-01: 50 mg via INTRAVENOUS

## 2021-09-01 MED ORDER — SODIUM CHLORIDE 0.9 % IV SOLN: INTRAVENOUS | Status: DC | PRN

## 2021-09-01 MED ORDER — PROPOFOL 500 MG/50ML IV EMUL
INTRAVENOUS | Status: DC | PRN
  Administered 2021-09-01: 130 ug/kg/min via INTRAVENOUS
  Administered 2021-09-01: 70 mg via INTRAVENOUS

## 2021-09-01 MED ORDER — GLYCOPYRROLATE 0.2 MG/ML IJ SOLN
INTRAMUSCULAR | Status: DC | PRN
  Administered 2021-09-01: .1 mg via INTRAVENOUS

## 2021-09-01 NOTE — H&P (Signed)
Jonathon Bellows, MD [address], [place], [place], [place], [address] [address], [place], [place], [place], [address] Phone: [phone]  Fax: [phone]  Primary Care Physician:  Kennett   Pre-Procedure History & Physical: HPI:  [name] is a 51 y.o. male is here for an colonoscopy.   Past Medical History:  Diagnosis Date   Acute on chronic respiratory failure with hypoxia (HCC)    CHF (congestive heart failure) (HCC)    EF 45-50%- 2020   CKD (chronic kidney disease) stage 3, GFR 30-59 ml/min (HCC)    Clear cell renal cell carcinoma (HCC)    s/p partial nephrectomy   COPD (chronic obstructive pulmonary disease) (HCC)    COPD, severe (HCC)    Coronary artery disease    Diabetes mellitus without complication (HCC)    Hypercholesteremia unk   Hypertension    Myocardial infarction ([place])    Obstructive sleep apnea    Paroxysmal A-fib (HCC)    Sarcoidosis    Sarcoidosis, lung (HCC)    Sleep apnea    does not use CPAP regularly.   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[facility])     Past Surgical History:  Procedure Laterality Date   APPENDECTOMY     COLONOSCOPY WITH PROPOFOL N/A 02/02/2021   Procedure: COLONOSCOPY WITH PROPOFOL;  Surgeon: Jonathon Bellows, MD;  Location: [facility] ENDOSCOPY;  Service: Gastroenterology;  Laterality: N/A;   KNEE SURGERY Right    PARTIAL NEPHRECTOMY     TRACHEOSTOMY TUBE PLACEMENT N/A 01/14/2020   Procedure: TRACHEOSTOMY;  Surgeon: Clyde Canterbury, MD;  Location: [facility] ORS;  Service: ENT;  Laterality: N/A;    Prior to Admission medications   Medication Sig Start Date End Date Taking? Authorizing Provider  atorvastatin (LIPITOR) 40 MG tablet Take 1 tablet (40 mg total) by mouth at bedtime. 11/21/20  Yes Darylene Price A, FNP  empagliflozin (JARDIANCE) 10 MG TABS tablet Take 1 tablet (10 mg total) by mouth daily before breakfast. 11/11/20  Yes Hackney, Tina A, FNP  fluticasone-salmeterol (ADVAIR) 250-50 MCG/ACT AEPB  Inhale 1 puff into the lungs every 12 (twelve) hours. 05/23/20  Yes [provider]  ipratropium-albuterol (DUONEB) 0.5-2.5 (3) MG/3ML SOLN Take 3 mLs by nebulization every 4 (four) hours. 07/12/20  Yes [provider]  metoprolol succinate (TOPROL-XL) 100 MG 24 hr tablet Take 1 tablet (100 mg total) by mouth daily. Take with or immediately following a meal. 08/02/21  Yes Hackney, Tina A, FNP  Potassium Chloride ER 20 MEQ TBCR Take 20 mEq by mouth daily. 04/05/20  Yes [provider]  VENTOLIN HFA 108 (90 Base) MCG/ACT inhaler Inhale 2 puffs into the lungs every 6 (six) hours as needed for wheezing or shortness of breath. 05/25/20  Yes [provider]  aspirin EC 81 MG tablet Take 81 mg by mouth daily. Swallow whole.    [provider]  ELIQUIS 5 MG TABS tablet Take 5 mg by mouth 2 (two) times daily. 07/11/20   [provider]  furosemide (LASIX) 40 MG tablet Take 1 tablet (40 mg total) by mouth daily. Patient taking differently: Take 40 mg by mouth daily. Takes as needed 09/15/20   Lorella Nimrod, MD  melatonin 5 MG TABS Take 5 mg by mouth at bedtime.    [provider]  NOVOLOG FLEXPEN 100 UNIT/ML FlexPen Inject 0-16 Units into the skin in the morning, at noon, in the evening, and at bedtime. 02/19/20   [provider]  sacubitril-valsartan (ENTRESTO) 24-26 MG Take 1 tablet by mouth 2 (two) times daily. 08/18/21   Alisa Graff, FNP  sildenafil (VIAGRA) 25 MG tablet Take 25 mg by mouth daily as needed for erectile dysfunction.    [provider]  Sod Picosulfate-Mag Ox-Cit Acd (CLENPIQ) 10-3.5-12 MG-GM -GM/160ML SOLN Take 1 bottle at 5 PM followed by five 8 oz cups of water and repeat 5 hours before procedure. Patient not taking: Reported on 09/01/2021 08/28/21   Jonathon Bellows, MD  spironolactone (ALDACTONE) 25 MG tablet Take 1 tablet (25 mg total) by mouth daily. 07/23/21   Alisa Graff, FNP  theophylline (THEODUR) 300 MG 12 hr  tablet Take 300 mg by mouth 2 (two) times daily. 04/08/20   [provider]  tiotropium (SPIRIVA) 18 MCG inhalation capsule Place 18 mcg into inhaler and inhale daily.    [provider]    Allergies as of 02/28/2021 - Review Complete 02/28/2021  Allergen Reaction Noted   Penicillins Anaphylaxis and Other (See Comments) 08/11/2014    Family History  Problem Relation Age of Onset   Hypertension Mother    Heart disease Mother    Diabetes Mother    Cancer Father     Social History   Socioeconomic History   Marital status: Married    Spouse name: Not on file   Number of children: Not on file   Years of education: Not on file   Highest education level: Not on file  Occupational History   Occupation: disabled  Tobacco Use   Smoking status: Former    Years: 25.00    Types: Cigarettes    Quit date: 12/30/2019    Years since quitting: 1.6   Smokeless tobacco: Never  Vaping Use   Vaping Use: Never used  Substance and Sexual Activity   Alcohol use: Yes    Comment: occassional   Drug use: Yes    Types: Marijuana    Comment: last time smoked 2021   Sexual activity: Yes  Other Topics Concern   Not on file  Social History Narrative   Not on file   Social Determinants of Health   Financial Resource Strain: Low Risk  (12/15/2018)   Overall Financial Resource Strain (CARDIA)    Difficulty of Paying Living Expenses: Not hard at all  Food Insecurity: No Food Insecurity (12/15/2018)   Hunger Vital Sign    Worried About Running Out of Food in the Last Year: Never true    [place] in the Last Year: Never true  Transportation Needs: Unknown (12/15/2018)   PRAPARE - Hydrologist (Medical): Not on file    Lack of Transportation (Non-Medical): No  Physical Activity: Sufficiently Active (12/15/2018)   Exercise Vital Sign    Days of Exercise per Week: 6 days    Minutes of Exercise per Session: 30 min  Stress: No Stress Concern Present  (12/15/2018)   [place]    Feeling of Stress : Not at all  Social Connections: Moderately Integrated (12/15/2018)   Social Connection and Isolation Panel [NHANES]    Frequency of Communication with Friends and Family: More than three times a week    Frequency of Social Gatherings with Friends and Family: More than three times a week    Attends Religious Services: 1 to 4 times per year    Active Member of [employer] or Organizations: No    Attends [employer]  or Organization Meetings: Never    Marital Status: Married  Human resources officer Violence: Not At Risk (12/15/2018)   Humiliation, Afraid, Rape, and Kick questionnaire    Fear of Current or Ex-Partner: No    Emotionally Abused: No    Physically Abused: No    Sexually Abused: No    Review of Systems: See HPI, otherwise negative ROS  Physical Exam: BP 137/90   Pulse 85   Temp 97.6 F (36.4 C) (Temporal)   Resp 20   Ht '6\' 1"'$  (1.854 m)   Wt 119.3 kg   SpO2 97%   BMI 34.70 kg/m  General:   Alert,  pleasant and cooperative in NAD Head:  Normocephalic and atraumatic. Neck:  Supple; no masses or thyromegaly. Lungs:  Clear throughout to auscultation, normal respiratory effort.    Heart:  +S1, +S2, Regular rate and rhythm, No edema. Abdomen:  Soft, nontender and nondistended. Normal bowel sounds, without guarding, and without rebound.   Neurologic:  Alert and  oriented x4;  grossly normal neurologically.  Impression/Plan: [name] is here for an colonoscopy to be performed for Screening colonoscopy average risk   Risks, benefits, limitations, and alternatives regarding  colonoscopy have been reviewed with the patient.  Questions have been answered.  All parties agreeable.   Jonathon Bellows, MD  09/01/2021, 10:23 AM

## 2021-09-01 NOTE — Transfer of Care (Signed)
Immediate Anesthesia Transfer of Care Note  Patient: [name]  Procedure(s) Performed: COLONOSCOPY WITH PROPOFOL  Patient Location: PACU and Endoscopy Unit  Anesthesia Type:General  Level of Consciousness: drowsy  Airway & Oxygen Therapy: Patient Spontanous Breathing with nasal cannula @ 2L/min  Post-op Assessment: Report given to RN  Post vital signs: Reviewed and stable  Last Vitals:  Vitals Value Taken Time  BP 119/70 09/01/21 1106  Temp 36 C 09/01/21 1105  Pulse 102 09/01/21 1106  Resp 39 09/01/21 1106  SpO2 98 % 09/01/21 1106  Vitals shown include unvalidated device data.  Last Pain:  Vitals:   09/01/21 1105  TempSrc: Temporal  PainSc:          Complications: No notable events documented.

## 2021-09-01 NOTE — Op Note (Signed)
[facility] Gastroenterology Patient Name: [name] Procedure Date: 09/01/2021 10:42 AM MRN: [id number] Account #: [id number] Date of Birth: 11/14/1970 Admit Type: Outpatient Age: 51 Room: [facility] ENDO ROOM 3 Gender: Male Note Status: Finalized Instrument Name: Jasper Riling [id number] Procedure:             Colonoscopy Indications:           Screening for colorectal malignant neoplasm, Screening                         for colorectal malignant neoplasm, inadequate bowel                         prep on last colonoscopy (more recent than 10 years                         ago) Providers:             Jonathon Bellows MD, MD Referring MD:          No Local Md, MD (Referring MD) Medicines:             Monitored Anesthesia Care Complications:         No immediate complications. Procedure:             Pre-Anesthesia Assessment:                        - Prior to the procedure, a History and Physical was                         performed, and patient medications, allergies and                         sensitivities were reviewed. The patient's tolerance                         of previous anesthesia was reviewed.                        - The risks and benefits of the procedure and the                         sedation options and risks were discussed with the                         patient. All questions were answered and informed                         consent was obtained.                        - ASA Grade Assessment: III - A patient with severe                         systemic disease.                        After obtaining informed consent, the colonoscope was                         passed under direct vision.  Throughout the procedure,                         the patient's blood pressure, pulse, and oxygen                         saturations were monitored continuously. The                         Colonoscope was introduced through the anus and                          advanced to the the cecum, identified by the                         appendiceal orifice. The colonoscopy was performed                         with ease. The patient tolerated the procedure well.                         The quality of the bowel preparation was adequate. Findings:      The perianal and digital rectal examinations were normal.      A 5 mm polyp was found in the ascending colon. The polyp was sessile.       The polyp was removed with a cold snare. Resection was complete, but the       polyp tissue was not retrieved.      The exam was otherwise without abnormality on direct and retroflexion       views. Impression:            - One 5 mm polyp in the ascending colon, removed with                         a cold snare. Complete resection. Polyp tissue not                         retrieved.                        - The examination was otherwise normal on direct and                         retroflexion views. Recommendation:        - Discharge patient to home (with escort).                        - Resume previous diet.                        - Continue present medications.                        - Repeat colonoscopy in 5 years for surveillance. Procedure Code(s):     --- Professional ---                        [phone], Colonoscopy, flexible; with removal of  tumor(s), polyp(s), or other lesion(s) by snare                         technique Diagnosis Code(s):     --- Professional ---                        K63.5, Polyp of colon                        Z12.11, Encounter for screening for malignant neoplasm                         of colon CPT copyright 2019 American Medical Association. All rights reserved. The codes documented in this report are preliminary and upon coder review may  be revised to meet current compliance requirements. Jonathon Bellows, MD Jonathon Bellows MD, MD 09/01/2021 11:03:27 AM This report has been signed electronically. Number of Addenda:  0 Note Initiated On: 09/01/2021 10:42 AM Scope Withdrawal Time: 0 hours 7 minutes 35 seconds  Total Procedure Duration: 0 hours 13 minutes 49 seconds  Estimated Blood Loss:  Estimated blood loss: none.      [facility]

## 2021-09-01 NOTE — Anesthesia Postprocedure Evaluation (Signed)
Anesthesia Post Note  Patient: [name]  Procedure(s) Performed: COLONOSCOPY WITH PROPOFOL  Patient location during evaluation: PACU Anesthesia Type: General Level of consciousness: awake and alert Pain management: pain level controlled Vital Signs Assessment: post-procedure vital signs reviewed and stable Respiratory status: spontaneous breathing and respiratory function stable Cardiovascular status: stable Anesthetic complications: no   No notable events documented.   Last Vitals:  Vitals:   09/01/21 1018 09/01/21 1105  BP: 137/90   Pulse: 85   Resp: 20   Temp: 36.4 C (!) 36 C  SpO2: 97%     Last Pain:  Vitals:   09/01/21 1125  TempSrc:   PainSc: 0-No pain                 VAN STAVEREN,Avrianna Smart

## 2021-09-01 NOTE — Anesthesia Preprocedure Evaluation (Signed)
Anesthesia Evaluation  Patient identified by MRN, date of birth, ID band Patient awake    Reviewed: Allergy & Precautions, NPO status , Patient's Chart, lab work & pertinent test results  Airway Mallampati: III  TM Distance: >3 FB Neck ROM: Full    Dental  (+) Teeth Intact   Pulmonary neg pulmonary ROS, sleep apnea , COPD,  COPD inhaler, former smoker,    Pulmonary exam normal  + decreased breath sounds      Cardiovascular Exercise Tolerance: Poor hypertension, Pt. on medications + CAD, + Past MI and +CHF  negative cardio ROS Normal cardiovascular exam Rhythm:Regular     Neuro/Psych CVA, No Residual Symptoms negative neurological ROS  negative psych ROS   GI/Hepatic negative GI ROS, Neg liver ROS,   Endo/Other  negative endocrine ROSdiabetes, Type 2Morbid obesity  Renal/GU negative Renal ROS  negative genitourinary   Musculoskeletal negative musculoskeletal ROS (+)   Abdominal (+) + obese,   Peds negative pediatric ROS (+)  Hematology negative hematology ROS (+)   Anesthesia Other Findings Past Medical History: No date: Acute on chronic respiratory failure with hypoxia (HCC) No date: CHF (congestive heart failure) (HCC)     Comment:  EF 45-50%- 2020 No date: CKD (chronic kidney disease) stage 3, GFR 30-59 ml/min (HCC) No date: Clear cell renal cell carcinoma (HCC)     Comment:  s/p partial nephrectomy No date: COPD (chronic obstructive pulmonary disease) (HCC) No date: COPD, severe (HCC) No date: Coronary artery disease No date: Diabetes mellitus without complication (HCC) unk: Hypercholesteremia No date: Hypertension No date: Myocardial infarction ([place]) No date: Obstructive sleep apnea No date: Paroxysmal A-fib (HCC) No date: Sarcoidosis No date: Sarcoidosis, lung (HCC) No date: Sleep apnea     Comment:  does not use CPAP regularly. 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[facility])  Past  Surgical History: No date: APPENDECTOMY 02/02/2021: COLONOSCOPY WITH PROPOFOL; N/A     Comment:  Procedure: COLONOSCOPY WITH PROPOFOL;  Surgeon: Jonathon Bellows, MD;  Location: [facility] ENDOSCOPY;  Service:               Gastroenterology;  Laterality: N/A; No date: KNEE SURGERY; Right No date: PARTIAL NEPHRECTOMY 01/14/2020: TRACHEOSTOMY TUBE PLACEMENT; N/A     Comment:  Procedure: TRACHEOSTOMY;  Surgeon: Clyde Canterbury, MD;                Location: [facility] ORS;  Service: ENT;  Laterality: N/A;  BMI    Body Mass Index: 34.70 kg/m      Reproductive/Obstetrics negative OB ROS                             Anesthesia Physical Anesthesia Plan  ASA: 3  Anesthesia Plan: General   Post-op Pain Management:    Induction: Intravenous  PONV Risk Score and Plan: Propofol infusion and TIVA  Airway Management Planned: Natural Airway  Additional Equipment:   Intra-op Plan:   Post-operative Plan:   Informed Consent: I have reviewed the patients History and Physical, chart, labs and discussed the procedure including the risks, benefits and alternatives for the proposed anesthesia with the patient or authorized representative who has indicated his/her understanding and acceptance.     Dental Advisory Given  Plan Discussed with: Anesthesiologist, CRNA and Surgeon  Anesthesia Plan Comments:         Anesthesia Quick Evaluation

## 2021-09-04 ENCOUNTER — Encounter: Payer: Self-pay | Admitting: Gastroenterology

## 2022-01-05 ENCOUNTER — Other Ambulatory Visit: Payer: Self-pay | Admitting: Family

## 2022-01-05 MED ORDER — EMPAGLIFLOZIN 10 MG PO TABS: 10.0000 mg | ORAL_TABLET | Freq: Every day | ORAL | 5 refills | Status: AC

## 2022-01-10 ENCOUNTER — Emergency Department
Admission: EM | Admit: 2022-01-10 | Discharge: 2022-01-10 | Disposition: A | Discharge status: Home / Self Care | Payer: Medicare HMO | Attending: Emergency Medicine | Admitting: Emergency Medicine

## 2022-01-10 ENCOUNTER — Emergency Department: Payer: Medicare HMO

## 2022-01-10 ENCOUNTER — Other Ambulatory Visit: Payer: Self-pay

## 2022-01-10 DIAGNOSIS — E1122 Type 2 diabetes mellitus with diabetic chronic kidney disease: Secondary | ICD-10-CM | POA: Insufficient documentation

## 2022-01-10 DIAGNOSIS — Z1152 Encounter for screening for COVID-19: Secondary | ICD-10-CM | POA: Diagnosis not present

## 2022-01-10 DIAGNOSIS — N183 Chronic kidney disease, stage 3 unspecified: Secondary | ICD-10-CM | POA: Insufficient documentation

## 2022-01-10 DIAGNOSIS — Z7901 Long term (current) use of anticoagulants: Secondary | ICD-10-CM | POA: Insufficient documentation

## 2022-01-10 DIAGNOSIS — I130 Hypertensive heart and chronic kidney disease with heart failure and stage 1 through stage 4 chronic kidney disease, or unspecified chronic kidney disease: Secondary | ICD-10-CM | POA: Insufficient documentation

## 2022-01-10 DIAGNOSIS — I2510 Atherosclerotic heart disease of native coronary artery without angina pectoris: Secondary | ICD-10-CM | POA: Insufficient documentation

## 2022-01-10 DIAGNOSIS — Z794 Long term (current) use of insulin: Secondary | ICD-10-CM | POA: Insufficient documentation

## 2022-01-10 DIAGNOSIS — Z7982 Long term (current) use of aspirin: Secondary | ICD-10-CM | POA: Diagnosis not present

## 2022-01-10 DIAGNOSIS — J449 Chronic obstructive pulmonary disease, unspecified: Secondary | ICD-10-CM | POA: Diagnosis not present

## 2022-01-10 DIAGNOSIS — Z7984 Long term (current) use of oral hypoglycemic drugs: Secondary | ICD-10-CM | POA: Diagnosis not present

## 2022-01-10 DIAGNOSIS — Z79899 Other long term (current) drug therapy: Secondary | ICD-10-CM | POA: Insufficient documentation

## 2022-01-10 DIAGNOSIS — Z930 Tracheostomy status: Secondary | ICD-10-CM | POA: Diagnosis not present

## 2022-01-10 DIAGNOSIS — R0602 Shortness of breath: Secondary | ICD-10-CM | POA: Insufficient documentation

## 2022-01-10 DIAGNOSIS — I5042 Chronic combined systolic (congestive) and diastolic (congestive) heart failure: Secondary | ICD-10-CM | POA: Insufficient documentation

## 2022-01-10 DIAGNOSIS — J189 Pneumonia, unspecified organism: Secondary | ICD-10-CM

## 2022-01-10 DIAGNOSIS — J441 Chronic obstructive pulmonary disease with (acute) exacerbation: Secondary | ICD-10-CM

## 2022-01-10 LAB — RESP PANEL BY RT-PCR (RSV, FLU A&B, COVID)  RVPGX2
Influenza A by PCR: NEGATIVE
Influenza B by PCR: NEGATIVE
Resp Syncytial Virus by PCR: NEGATIVE
SARS Coronavirus 2 by RT PCR: NEGATIVE

## 2022-01-10 LAB — COMPREHENSIVE METABOLIC PANEL
ALT: 18 U/L (ref 0–44)
AST: 23 U/L (ref 15–41)
Albumin: 4.1 g/dL (ref 3.5–5.0)
Alkaline Phosphatase: 72 U/L (ref 38–126)
Anion gap: 5 (ref 5–15)
BUN: 19 mg/dL (ref 6–20)
CO2: 26 mmol/L (ref 22–32)
Calcium: 8.8 mg/dL — ABNORMAL LOW (ref 8.9–10.3)
Chloride: 107 mmol/L (ref 98–111)
Creatinine, Ser: 1.54 mg/dL — ABNORMAL HIGH (ref 0.61–1.24)
GFR, Estimated: 54 mL/min — ABNORMAL LOW (ref >60)
Glucose, Bld: 117 mg/dL — ABNORMAL HIGH (ref 70–99)
Potassium: 4.2 mmol/L (ref 3.5–5.1)
Sodium: 138 mmol/L (ref 135–145)
Total Bilirubin: 0.7 mg/dL (ref 0.3–1.2)
Total Protein: 8.1 g/dL (ref 6.5–8.1)

## 2022-01-10 LAB — CBC WITH DIFFERENTIAL/PLATELET
Abs Immature Granulocytes: 0.02 10*3/uL (ref 0.00–0.07)
Basophils Absolute: 0 10*3/uL (ref 0.0–0.1)
Basophils Relative: 0 %
Eosinophils Absolute: 0 10*3/uL (ref 0.0–0.5)
Eosinophils Relative: 0 %
HCT: 46.4 % (ref 39.0–52.0)
Hemoglobin: 14.7 g/dL (ref 13.0–17.0)
Immature Granulocytes: 0 %
Lymphocytes Relative: 13 %
Lymphs Abs: 1.2 10*3/uL (ref 0.7–4.0)
MCH: 27.9 pg (ref 26.0–34.0)
MCHC: 31.7 g/dL (ref 30.0–36.0)
MCV: 88 fL (ref 80.0–100.0)
Monocytes Absolute: 1.3 10*3/uL — ABNORMAL HIGH (ref 0.1–1.0)
Monocytes Relative: 14 %
Neutro Abs: 6.8 10*3/uL (ref 1.7–7.7)
Neutrophils Relative %: 73 %
Platelets: 212 10*3/uL (ref 150–400)
RBC: 5.27 MIL/uL (ref 4.22–5.81)
RDW: 12.7 % (ref 11.5–15.5)
WBC: 9.4 10*3/uL (ref 4.0–10.5)
nRBC: 0 % (ref 0.0–0.2)

## 2022-01-10 LAB — BLOOD GAS, ARTERIAL
Acid-Base Excess: 4.5 mmol/L — ABNORMAL HIGH (ref 0.0–2.0)
Bicarbonate: 31.2 mmol/L — ABNORMAL HIGH (ref 20.0–28.0)
O2 Content: 1 L/min
O2 Saturation: 96 %
Patient temperature: 37
pCO2 arterial: 54 mmHg — ABNORMAL HIGH (ref 32–48)
pH, Arterial: 7.37 (ref 7.35–7.45)
pO2, Arterial: 73 mmHg — ABNORMAL LOW (ref 83–108)

## 2022-01-10 LAB — LACTIC ACID, PLASMA
Lactic Acid, Venous: 0.9 mmol/L (ref 0.5–1.9)
Lactic Acid, Venous: 0.9 mmol/L (ref 0.5–1.9)

## 2022-01-10 LAB — PROCALCITONIN: Procalcitonin: 0.12 ng/mL

## 2022-01-10 LAB — TROPONIN I (HIGH SENSITIVITY): Troponin I (High Sensitivity): 20 ng/L — ABNORMAL HIGH (ref <18)

## 2022-01-10 LAB — BRAIN NATRIURETIC PEPTIDE: B Natriuretic Peptide: 41.3 pg/mL (ref 0.0–100.0)

## 2022-01-10 MED ORDER — METHYLPREDNISOLONE SODIUM SUCC 125 MG IJ SOLR
125.0000 mg | Freq: Once | INTRAMUSCULAR | Status: AC
  Administered 2022-01-10: 125 mg via INTRAVENOUS
  Filled 2022-01-10: qty 2

## 2022-01-10 MED ORDER — PREDNISONE 50 MG PO TABS: 50.0000 mg | ORAL_TABLET | Freq: Every day | ORAL | 0 refills | Status: AC

## 2022-01-10 MED ORDER — LEVOFLOXACIN 500 MG PO TABS
750.0000 mg | ORAL_TABLET | Freq: Every day | ORAL | Status: DC
  Administered 2022-01-10: 750 mg via ORAL
  Filled 2022-01-10: qty 2

## 2022-01-10 MED ORDER — LEVOFLOXACIN 500 MG PO TABS: 500.0000 mg | ORAL_TABLET | Freq: Every day | ORAL | 0 refills | Status: AC

## 2022-01-10 MED ORDER — IPRATROPIUM-ALBUTEROL 0.5-2.5 (3) MG/3ML IN SOLN
3.0000 mL | Freq: Once | RESPIRATORY_TRACT | Status: AC
  Administered 2022-01-10: 3 mL via RESPIRATORY_TRACT
  Filled 2022-01-10: qty 3

## 2022-01-10 NOTE — Discharge Instructions (Signed)
Take your antibiotics and prednisone as prescribed.  Use your inhaler every 4-6 hours as needed for shortness of breath.  Follow-up with your primary doctor this week.  Thank you for choosing [place] for your health care today!  Please see your primary doctor this week for a follow up appointment.   If you do not have a primary doctor call the following clinics to establish care:  If you have insurance:  [facility] [phone] [place] [address]   [facility]  [phone] [place] [place] [address]   If you do not have insurance:  [facility]  [phone] [address], [place]  [address]  Sometimes, in the early stages of certain disease courses it is difficult to detect in the emergency department evaluation -- so, it is important that you continue to monitor your symptoms and call your doctor right away or return to the emergency department if you develop any new or worsening symptoms.  It was my pleasure to care for you today.   Hoover Brunette Jacelyn Grip, MD

## 2022-01-10 NOTE — ED Notes (Signed)
Respiratory at bedside.

## 2022-01-10 NOTE — ED Triage Notes (Signed)
To triage via wheelchair with c/o shortness of breath,congestion, loss of appetite and abd pain. Sx began yesterday. " I also need my blood gas levels checked just to be safe."

## 2022-01-10 NOTE — ED Notes (Addendum)
Resp at bedside. Resp suctioning pt and will do with initial duo-neb treatment. Pt will need a new cannula that respiratory will bring back from their floor.

## 2022-01-10 NOTE — ED Provider Notes (Signed)
Patient with COPD and trach with productive cough and evidence of questionable pneumonia on chest x-ray.  He is stable and feels well in the emergency department after some breathing treatments and thyroids.  We will treat with community-acquired pneumonia coverage levofloxacin given comorbidities and allergy to penicillin.  We will also give a short course of steroid burst.  Fortunately the remainder of his lab testing is normal and he feels well and would like to be discharged with outpatient treatment at this time, he will follow-up with his PMD and return with any worsening.   Lucillie Garfinkel, MD 01/10/22 [phone]

## 2022-01-10 NOTE — ED Notes (Signed)
RT performed trach suctioning and changed trach collar per pt request.

## 2022-01-10 NOTE — ED Provider Notes (Signed)
[facility] Provider Note    Event Date/Time   First MD Initiated Contact with Patient 01/10/22 [phone]     (approximate)   History   Shortness of Breath   HPI  [name] is a 51 y.o. male who presents to the ED from home with a chief complaint of productive cough, congestion, shortness of breath, loss of appetite and left-sided abdominal cramps and coughing.  Symptoms x1 day.  History of CHF on 2 L heated nasal cannula oxygen during the day; has tracheostomy in place and uses a ventilator to sleep at night.  Denies fever/chills, chest pain, abdominal pain, nausea, vomiting or dizziness.  Requesting blood gas levels to be checked.     Past Medical History   Past Medical History:  Diagnosis Date   Acute on chronic respiratory failure with hypoxia (HCC)    CHF (congestive heart failure) (HCC)    EF 45-50%- 2020   CKD (chronic kidney disease) stage 3, GFR 30-59 ml/min (HCC)    Clear cell renal cell carcinoma (HCC)    s/p partial nephrectomy   COPD (chronic obstructive pulmonary disease) (HCC)    COPD, severe (HCC)    Coronary artery disease    Diabetes mellitus without complication (HCC)    Hypercholesteremia unk   Hypertension    Myocardial infarction ([place])    Obstructive sleep apnea    Paroxysmal A-fib (HCC)    Sarcoidosis    Sarcoidosis, lung (HCC)    Sleep apnea    does not use CPAP regularly.   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[facility])      Active Problem List   Patient Active Problem List   Diagnosis Date Noted   Obesity (BMI 35.0-39.9 without comorbidity)    Obesity hypoventilation syndrome ([place])    Acute on chronic respiratory failure with hypoxemia ([place]) 09/01/2020   AF (paroxysmal atrial fibrillation) ([place]) 07/24/2020   Acute on chronic respiratory failure with hypercapnia (HCC)    COPD, severe (HCC)    Sarcoidosis, lung ([place])    Obstructive sleep apnea syndrome    Tracheostomy status ([place])    Acute kidney injury  superimposed on CKD ([place])    Acute and chr resp failure, unsp w hypoxia or hypercapnia ([place]) 01/02/2020   Acute respiratory failure with hypoxia and hypercapnia ([place]) 01/01/2020   CKD stage 3 due to type 2 diabetes mellitus ([place]) 01/01/2020   Respiratory failure with hypercapnia ([place]) 07/31/2019   Acute on chronic respiratory failure with hypoxia ([place]) 06/10/2019   Acute on chronic respiratory failure with hypoxia and hypercapnia ([place]) 11/17/2018   Acute exacerbation of CHF (congestive heart failure) ([place]) 11/07/2018   Chest pain 11/06/2018   Acute exacerbation of chronic obstructive pulmonary disease (COPD) ([place]) 07/29/2018   COPD with acute exacerbation ([place]) 02/03/2018   COPD (chronic obstructive pulmonary disease) ([place]) 07/16/2016   Chronic diastolic heart failure ([place]) 03/23/2016   Hypercarbia    COPD exacerbation ([place]) 11/17/2015   HTN (hypertension) 06/14/2015   Dental caries 03/08/2015   Morbid obesity ([place]) 02/21/2015   OSA and COPD overlap syndrome ([place]) 02/21/2015   Thrombocytopenia ([place]) 02/21/2015   Hypernatremia 02/21/2015   Type 2 diabetes mellitus with hyperlipidemia ([place]) 67/67/2094   Chronic systolic CHF (congestive heart failure) ([place]) 10/18/2014   Chronic obstructive pulmonary disease ([place]) 10/18/2014   Sarcoidosis 04/22/2013   Hyperlipidemia 04/22/2013     Past Surgical History   Past Surgical History:  Procedure Laterality Date   APPENDECTOMY  COLONOSCOPY WITH PROPOFOL N/A 02/02/2021   Procedure: COLONOSCOPY WITH PROPOFOL;  Surgeon: Jonathon Bellows, MD;  Location: [facility] ENDOSCOPY;  Service: Gastroenterology;  Laterality: N/A;   COLONOSCOPY WITH PROPOFOL N/A 09/01/2021   Procedure: COLONOSCOPY WITH PROPOFOL;  Surgeon: Jonathon Bellows, MD;  Location: [facility] ENDOSCOPY;  Service: Gastroenterology;  Laterality: N/A;   KNEE SURGERY Right    PARTIAL NEPHRECTOMY     TRACHEOSTOMY TUBE PLACEMENT N/A 01/14/2020   Procedure: TRACHEOSTOMY;  Surgeon: Clyde Canterbury, MD;   Location: [facility] ORS;  Service: ENT;  Laterality: N/A;     Home Medications   Prior to Admission medications   Medication Sig Start Date End Date Taking? Authorizing Provider  aspirin EC 81 MG tablet Take 81 mg by mouth daily. Swallow whole.    [provider]  atorvastatin (LIPITOR) 40 MG tablet Take 1 tablet (40 mg total) by mouth at bedtime. 11/21/20   Hackney, Tina A, FNP  ELIQUIS 5 MG TABS tablet Take 5 mg by mouth 2 (two) times daily. 07/11/20   [provider]  empagliflozin (JARDIANCE) 10 MG TABS tablet Take 1 tablet (10 mg total) by mouth daily before breakfast. 01/05/22   Alisa Graff, FNP  fluticasone-salmeterol (ADVAIR) 250-50 MCG/ACT AEPB Inhale 1 puff into the lungs every 12 (twelve) hours. 05/23/20   [provider]  furosemide (LASIX) 40 MG tablet Take 1 tablet (40 mg total) by mouth daily. Patient taking differently: Take 40 mg by mouth daily. Takes as needed 09/15/20   Lorella Nimrod, MD  ipratropium-albuterol (DUONEB) 0.5-2.5 (3) MG/3ML SOLN Take 3 mLs by nebulization every 4 (four) hours. 07/12/20   [provider]  melatonin 5 MG TABS Take 5 mg by mouth at bedtime.    [provider]  metoprolol succinate (TOPROL-XL) 100 MG 24 hr tablet Take 1 tablet (100 mg total) by mouth daily. Take with or immediately following a meal. 08/02/21   Hackney, Aura Fey, FNP  NOVOLOG FLEXPEN 100 UNIT/ML FlexPen Inject 0-16 Units into the skin in the morning, at noon, in the evening, and at bedtime. 02/19/20   [provider]  Potassium Chloride ER 20 MEQ TBCR Take 20 mEq by mouth daily. 04/05/20   [provider]  sacubitril-valsartan (ENTRESTO) 24-26 MG Take 1 tablet by mouth 2 (two) times daily. 08/18/21   Alisa Graff, FNP  sildenafil (VIAGRA) 25 MG tablet Take 25 mg by mouth daily as needed for erectile dysfunction.    [provider]  Sod Picosulfate-Mag Ox-Cit Acd (CLENPIQ) 10-3.5-12 MG-GM -GM/160ML SOLN Take 1 bottle at 5 PM  followed by five 8 oz cups of water and repeat 5 hours before procedure. Patient not taking: Reported on 09/01/2021 08/28/21   Jonathon Bellows, MD  spironolactone (ALDACTONE) 25 MG tablet Take 1 tablet (25 mg total) by mouth daily. 07/23/21   Alisa Graff, FNP  theophylline (THEODUR) 300 MG 12 hr tablet Take 300 mg by mouth 2 (two) times daily. 04/08/20   [provider]  tiotropium (SPIRIVA) 18 MCG inhalation capsule Place 18 mcg into inhaler and inhale daily.    [provider]  VENTOLIN HFA 108 (90 Base) MCG/ACT inhaler Inhale 2 puffs into the lungs every 6 (six) hours as needed for wheezing or shortness of breath. 05/25/20   [provider]     Allergies  Penicillins   Family History   Family History  Problem Relation Age of Onset   Hypertension Mother    Heart disease Mother    Diabetes  Mother    Cancer Father      Physical Exam  Triage Vital Signs: ED Triage Vitals  Enc Vitals Group     BP 01/10/22 0557 117/70     Pulse Rate 01/10/22 0557 97     Resp 01/10/22 0557 18     Temp 01/10/22 0557 99.8 F (37.7 C)     Temp Source 01/10/22 0557 Oral     SpO2 01/10/22 0557 94 %     Weight 01/10/22 0556 259 lb (117.5 kg)     Height 01/10/22 0556 '6\' 1"'$  (1.854 m)     Head Circumference --      Peak Flow --      Pain Score --      Pain Loc --      Pain Edu? --      Excl. in [place]? --     Updated Vital Signs: BP 117/70 (BP Location: Right Arm)   Pulse 97   Temp 99.8 F (37.7 C) (Oral)   Resp 18   Ht '6\' 1"'$  (1.854 m)   Wt 117.5 kg   SpO2 94%   BMI 34.17 kg/m    General: Awake, no distress.  CV:  RRR.  Good peripheral perfusion.  Resp:  Normal effort.  Diminished bibasilarly. Abd:  Nontender to palpation.  No distention.  Other:  Bilateral calves are nonswollen and nontender.  Tracheostomy in place.   ED Results / Procedures / Treatments  Labs (all labs ordered are listed, but only abnormal results are displayed) Labs Reviewed  BLOOD GAS,  ARTERIAL - Abnormal; Notable for the following components:      Result Value   pCO2 arterial 54 (*)    pO2, Arterial 73 (*)    Bicarbonate 31.2 (*)    Acid-Base Excess 4.5 (*)    All other components within normal limits  RESP PANEL BY RT-PCR (FLU A&B, COVID) ARPGX2  CULTURE, BLOOD (ROUTINE X 2)  CULTURE, BLOOD (ROUTINE X 2)  CBC WITH DIFFERENTIAL/PLATELET  COMPREHENSIVE METABOLIC PANEL  BRAIN NATRIURETIC PEPTIDE  LACTIC ACID, PLASMA  LACTIC ACID, PLASMA  PROCALCITONIN  TROPONIN I (HIGH SENSITIVITY)     EKG  ED ECG REPORT I, Aki Abalos J, the attending physician, personally viewed and interpreted this ECG.   Date: 01/10/2022  EKG Time: 0602  Rate: 95  Rhythm: normal sinus rhythm  Axis: LAD  Intervals: Bifascicular block  ST&T Change: Nonspecific    RADIOLOGY I have independently visualized and interpreted patient's chest x-ray as well as noted the radiology interpretation:  X-ray: Increased left base collapse/consolidation with tiny bilateral pleural effusions  Official radiology report(s): DG Chest Portable 1 View  Result Date: 01/10/2022 CLINICAL DATA:  Shortness of breath. EXAM: PORTABLE CHEST 1 VIEW COMPARISON:  09/05/2020 FINDINGS: Extensive architectural distortion and scarring again noted in both lungs, similar to prior. There is increased left base collapse/consolidation with tiny bilateral pleural effusions evident. Tracheostomy tube remains in place. Cardiopericardial silhouette is at upper limits of normal for size. The visualized bony structures of the thorax are unremarkable. IMPRESSION: 1. Extensive architectural distortion and scarring in both lungs. 2. Increased left base collapse/consolidation with tiny bilateral pleural effusions. Electronically Signed   By: Misty Stanley M.D.   On: 01/10/2022 06:44     PROCEDURES:  Critical Care performed: Yes, see critical care procedure note(s)  CRITICAL CARE Performed by: Paulette Blanch   Total critical care  time: 30 minutes  Critical care time was exclusive of separately billable procedures and treating  other patients.  Critical care was necessary to treat or prevent imminent or life-threatening deterioration.  Critical care was time spent personally by me on the following activities: development of treatment plan with patient and/or surrogate as well as nursing, discussions with consultants, evaluation of patient's response to treatment, examination of patient, obtaining history from patient or surrogate, ordering and performing treatments and interventions, ordering and review of laboratory studies, ordering and review of radiographic studies, pulse oximetry and re-evaluation of patient's condition.   Marland Kitchen1-3 Lead EKG Interpretation  Performed by: Paulette Blanch, MD Authorized by: Paulette Blanch, MD     Interpretation: normal     ECG rate:  95   ECG rate assessment: normal     Rhythm: sinus rhythm     Ectopy: none     Conduction: normal   Comments:     Patient placed on cardiac monitor to evaluate for arrhythmias    MEDICATIONS ORDERED IN ED: Medications  ipratropium-albuterol (DUONEB) 0.5-2.5 (3) MG/3ML nebulizer solution 3 mL (has no administration in time range)     IMPRESSION / MDM / ASSESSMENT AND PLAN / ED COURSE  I reviewed the triage vital signs and the nursing notes.                             51 year old male presenting with shortness of breath. Differential includes, but is not limited to, viral syndrome, bronchitis including COPD exacerbation, pneumonia, reactive airway disease including asthma, CHF including exacerbation with or without pulmonary/interstitial edema, pneumothorax, ACS, thoracic trauma, and pulmonary embolism.  I have personally reviewed patient's records and note his recent nephrology office visit on 01/08/2022 for chronic kidney disease.  Patient's presentation is most consistent with acute presentation with potential threat to life or bodily function.  The  patient is on the cardiac monitor to evaluate for evidence of arrhythmia and/or significant heart rate changes.  We will obtain sepsis protocol lab work and chest x-ray as patient has low-grade temperature and cold-like symptoms.  Obtain ABG per patient's request.  Patient normally wears 2 L humidified oxygen via nasal cannula; can only tolerate 1 L nonhumidified nasal cannula oxygen.  Will asked respiratory therapy to place patient on 2 L humidified nasal cannula oxygen as he does at home.  Will reassess.  Clinical Course as of 01/10/22 4081  Wed Jan 10, 2022  0707 Care transferred to Dr. Jacelyn Grip at change of shift pending lab work and reassessment. [JS]    Clinical Course User Index [JS] Paulette Blanch, MD     FINAL CLINICAL IMPRESSION(S) / ED DIAGNOSES   Final diagnoses:  Shortness of breath     Rx / DC Orders   ED Discharge Orders     None        Note:  This document was prepared using Dragon voice recognition software and may include unintentional dictation errors.   Paulette Blanch, MD 01/10/22 [phone]

## 2022-01-10 NOTE — Progress Notes (Signed)
Pt. Suctionned for moderate amount of thick yellow tan secretions x2. Pt. Tolerated procedure well.

## 2022-01-10 NOTE — ED Notes (Signed)
Resp. called about what to use for duo-neb treatment. She said she would bring equipment and meet me in the room.

## 2022-01-10 NOTE — ED Notes (Signed)
Called respiratory to suction pt per his request.

## 2022-01-15 LAB — CULTURE, BLOOD (ROUTINE X 2)
Culture: NO GROWTH
Culture: NO GROWTH
Special Requests: ADEQUATE

## 2022-01-15 NOTE — Progress Notes (Unsigned)
Patient ID: [name], male    DOB: March 14, 1970, 51 y.o.   MRN: [id number]  HPI  [name] is a 51 y/o male with a history of CVA, obstructive sleep apnea, HTN, sarcoidosis, hyperlipidemia, DM, CAD, COPD, remote tobacco use and chronic heart failure.  Echo report from 11/17/2018 reviewed and showed an EF of 45-50%. Echo done 02/15/15 and showed an EF of 60% with increased wall thickness. Mild Corey along with mild TR. This is stable from a previous echo done on 08/15/14.  Was in the ED 01/10/22 due to productive cough, congestion, shortness of breath, loss of appetite and left-sided abdominal cramps. CXR showed possible pneumonia. Given antibiotics and short course of prednisone and released.    He presents today for a follow-up visit with a chief complaint of minimal shortness of breath with moderate exertion. Describes this as chronic in nature. He has associated fatigue, cough and pedal edema along with this. He denies any difficulty sleeping, dizziness, abdominal distention, palpitations, chest pain or wheezing. He says that the edema resolves completely once he's had them elevated in the bed.   Has scales but hasn't been weighing daily.   Past Medical History:  Diagnosis Date   Acute on chronic respiratory failure with hypoxia (HCC)    CHF (congestive heart failure) (HCC)    EF 45-50%- 2020   CKD (chronic kidney disease) stage 3, GFR 30-59 ml/min (HCC)    Clear cell renal cell carcinoma (HCC)    s/p partial nephrectomy   COPD (chronic obstructive pulmonary disease) (HCC)    COPD, severe (HCC)    Coronary artery disease    Diabetes mellitus without complication (HCC)    Hypercholesteremia unk   Hypertension    Myocardial infarction ([place])    Obstructive sleep apnea    Paroxysmal A-fib (HCC)    Sarcoidosis    Sarcoidosis, lung (HCC)    Sleep apnea    does not use CPAP regularly.   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[facility])    Past Surgical History:  Procedure Laterality Date    APPENDECTOMY     COLONOSCOPY WITH PROPOFOL N/A 02/02/2021   Procedure: COLONOSCOPY WITH PROPOFOL;  Surgeon: Jonathon Bellows, MD;  Location: [facility] ENDOSCOPY;  Service: Gastroenterology;  Laterality: N/A;   COLONOSCOPY WITH PROPOFOL N/A 09/01/2021   Procedure: COLONOSCOPY WITH PROPOFOL;  Surgeon: Jonathon Bellows, MD;  Location: [facility] ENDOSCOPY;  Service: Gastroenterology;  Laterality: N/A;   KNEE SURGERY Right    PARTIAL NEPHRECTOMY     TRACHEOSTOMY TUBE PLACEMENT N/A 01/14/2020   Procedure: TRACHEOSTOMY;  Surgeon: Clyde Canterbury, MD;  Location: [facility] ORS;  Service: ENT;  Laterality: N/A;   Family History  Problem Relation Age of Onset   Hypertension Mother    Heart disease Mother    Diabetes Mother    Cancer Father    Social History   Tobacco Use   Smoking status: Former    Years: 25.00    Types: Cigarettes    Quit date: 12/30/2019    Years since quitting: 2.0   Smokeless tobacco: Never  Substance Use Topics   Alcohol use: Yes    Comment: occassional   Allergies  Allergen Reactions   Penicillins Anaphylaxis and Other (See Comments)    Has patient had a PCN reaction causing immediate rash, facial/tongue/throat swelling, SOB or lightheadedness with hypotension: Yes Has patient had a PCN reaction causing severe rash involving mucus membranes or skin necrosis: No Has patient had a PCN reaction that required  hospitalization: No Has patient had a PCN reaction occurring within the last 10 years: No If all of the above answers are "NO", then may proceed with Cephalosporin use.    Prior to Admission medications   Medication Sig Start Date End Date Taking? Authorizing Provider  aspirin EC 81 MG tablet Take 81 mg by mouth daily. Swallow whole.   Yes [provider]  atorvastatin (LIPITOR) 40 MG tablet Take 1 tablet (40 mg total) by mouth at bedtime. 11/21/20  Yes Jazmarie Biever A, FNP  ELIQUIS 5 MG TABS tablet Take 5 mg by mouth 2 (two) times daily. 07/11/20  Yes [provider]   empagliflozin (JARDIANCE) 10 MG TABS tablet Take 1 tablet (10 mg total) by mouth daily before breakfast. 01/05/22  Yes Marylouise Mallet A, FNP  fluticasone-salmeterol (ADVAIR) 250-50 MCG/ACT AEPB Inhale 1 puff into the lungs every 12 (twelve) hours. 05/23/20  Yes [provider]  furosemide (LASIX) 40 MG tablet Take 1 tablet (40 mg total) by mouth daily. Patient taking differently: Take 40 mg by mouth as needed. Takes as needed 09/15/20  Yes Lorella Nimrod, MD  ipratropium-albuterol (DUONEB) 0.5-2.5 (3) MG/3ML SOLN Take 3 mLs by nebulization every 4 (four) hours. 07/12/20  Yes [provider]  levofloxacin (LEVAQUIN) 500 MG tablet Take 1 tablet (500 mg total) by mouth daily for 7 days. 01/10/22 01/17/22 Yes Lucillie Garfinkel, MD  melatonin 5 MG TABS Take 5 mg by mouth at bedtime.   Yes [provider]  metoprolol succinate (TOPROL-XL) 100 MG 24 hr tablet Take 1 tablet (100 mg total) by mouth daily. Take with or immediately following a meal. 08/02/21  Yes Orlondo Holycross A, FNP  NOVOLOG FLEXPEN 100 UNIT/ML FlexPen Inject 0-16 Units into the skin in the morning, at noon, in the evening, and at bedtime. 02/19/20  Yes [provider]  Potassium Chloride ER 20 MEQ TBCR Take 20 mEq by mouth as needed (if takes furosemide). 04/05/20  Yes [provider]  sacubitril-valsartan (ENTRESTO) 24-26 MG Take 1 tablet by mouth 2 (two) times daily. 08/18/21  Yes Darylene Price A, FNP  spironolactone (ALDACTONE) 25 MG tablet Take 1 tablet (25 mg total) by mouth daily. 07/23/21  Yes Langley Flatley A, FNP  theophylline (THEODUR) 300 MG 12 hr tablet Take 300 mg by mouth 2 (two) times daily. 04/08/20  Yes [provider]  tiotropium (SPIRIVA) 18 MCG inhalation capsule Place 18 mcg into inhaler and inhale daily.   Yes [provider]  VENTOLIN HFA 108 (90 Base) MCG/ACT inhaler Inhale 2 puffs into the lungs every 6 (six) hours as needed for wheezing or shortness of breath. 05/25/20  Yes  [provider]  sildenafil (VIAGRA) 25 MG tablet Take 25 mg by mouth daily as needed for erectile dysfunction. Patient not taking: Reported on 01/16/2022    [provider]  Sod Picosulfate-Mag Ox-Cit Acd (CLENPIQ) 10-3.5-12 MG-GM -GM/160ML SOLN Take 1 bottle at 5 PM followed by five 8 oz cups of water and repeat 5 hours before procedure. Patient not taking: Reported on 09/01/2021 08/28/21   Jonathon Bellows, MD   Review of Systems  Constitutional:  Positive for fatigue. Negative for appetite change.  HENT:  Negative for congestion, postnasal drip and sore throat.   Eyes: Negative.   Respiratory:  Positive for cough and shortness of breath. Negative for chest tightness and wheezing.   Cardiovascular:  Positive for leg swelling ("very little"). Negative for chest pain and palpitations.  Gastrointestinal:  Negative for  abdominal distention and abdominal pain.  Endocrine: Negative.   Genitourinary: Negative.   Musculoskeletal:  Negative for back pain.  Skin: Negative.   Allergic/Immunologic: Negative.   Neurological:  Negative for dizziness and light-headedness.  Hematological:  Negative for adenopathy. Does not bruise/bleed easily.  Psychiatric/Behavioral:  Negative for dysphoric mood and sleep disturbance (wearing NIV at night connected to trach). The patient is not nervous/anxious.    Vitals:   01/16/22 0834  BP: (!) 115/55  Pulse: 88  Resp: 20  SpO2: 99%  Weight: 266 lb 6 oz (120.8 kg)   Wt Readings from Last 3 Encounters:  01/16/22 266 lb 6 oz (120.8 kg)  01/10/22 259 lb (117.5 kg)  09/01/21 263 lb (119.3 kg)   Lab Results  Component Value Date   CREATININE 1.54 (H) 01/10/2022   CREATININE 1.58 (H) 08/02/2021   CREATININE 1.50 (H) 04/21/2021   Physical Exam Vitals and nursing note reviewed.  Constitutional:      Appearance: He is well-developed.  HENT:     Head: Normocephalic and atraumatic.  Neck:     Vascular: No JVD.  Cardiovascular:     Rate and  Rhythm: Normal rate and regular rhythm.  Pulmonary:     Effort: Pulmonary effort is normal.     Breath sounds: No wheezing or rales.  Abdominal:     General: There is no distension.     Palpations: Abdomen is soft.     Tenderness: There is no abdominal tenderness.  Musculoskeletal:        General: No tenderness.     Cervical back: Normal range of motion and neck supple.     Right lower leg: No tenderness. Edema (1+ pitting) present.     Left lower leg: No tenderness. Edema (1+ pitting) present.  Skin:    General: Skin is warm and dry.  Neurological:     Mental Status: He is alert and oriented to person, place, and time.  Psychiatric:        Behavior: Behavior normal.        Thought Content: Thought content normal.   Assessment & Plan:  1: Chronic heart failure with mildly reduced ejection fraction- - NYHA class II - euvolemic today - not weighing daily but does have working scales; Reminded to call for an overnight weight gain of >2 pounds or a weekly weight gain of >5 pounds - weight up 5 pounds from last visit here 5.5 months ago - drinking about 4-6 bottles of water daily.  - cooking with salt but not adding to food. Encouraged to not add salt at atll - saw cardiology ([place]) 12/05/21 - on GDMT of metoprolol, jardiance, spironolactone and entresto - last echo done in 2020; have scheduled echo for 02/09/22 - taking lasix/ potassium PRN and says that he last took these ~ 1 month ago - BNP 01/10/22 was 41.3  2: HTN- - BP looks good (115/55) - follows with PCP @ [facility] ([facility])  - BMP 01/10/22 reviewed and showed sodium 138, potassium 4.2 creatinine 1.54 and GFR 54  3: COPD-  - using nocturnal ventilator at night through his trach - saw pulmonologist [name]) 11/23/21  4: Diabetes- - A1c was 6.6% on 01/28/20 - saw nephrology Smith Mince) 01/08/22   Patient did not bring his medications nor a list. Each medication was verbally reviewed with the patient and  he was encouraged to bring the bottles to every visit to confirm accuracy of list.  Return in 3 weeks, sooner if  needed.

## 2022-01-16 ENCOUNTER — Ambulatory Visit: Payer: Medicare HMO | Attending: Family | Admitting: Family

## 2022-01-16 ENCOUNTER — Encounter: Payer: Self-pay | Admitting: Family

## 2022-01-16 VITALS — BP 115/55 | HR 88 | Resp 20 | Wt 266.4 lb

## 2022-01-16 DIAGNOSIS — E1122 Type 2 diabetes mellitus with diabetic chronic kidney disease: Secondary | ICD-10-CM | POA: Insufficient documentation

## 2022-01-16 DIAGNOSIS — I130 Hypertensive heart and chronic kidney disease with heart failure and stage 1 through stage 4 chronic kidney disease, or unspecified chronic kidney disease: Secondary | ICD-10-CM | POA: Diagnosis not present

## 2022-01-16 DIAGNOSIS — Z794 Long term (current) use of insulin: Secondary | ICD-10-CM | POA: Diagnosis not present

## 2022-01-16 DIAGNOSIS — Z7951 Long term (current) use of inhaled steroids: Secondary | ICD-10-CM | POA: Diagnosis not present

## 2022-01-16 DIAGNOSIS — Z930 Tracheostomy status: Secondary | ICD-10-CM | POA: Insufficient documentation

## 2022-01-16 DIAGNOSIS — Z87891 Personal history of nicotine dependence: Secondary | ICD-10-CM | POA: Diagnosis not present

## 2022-01-16 DIAGNOSIS — J449 Chronic obstructive pulmonary disease, unspecified: Secondary | ICD-10-CM

## 2022-01-16 DIAGNOSIS — Z79899 Other long term (current) drug therapy: Secondary | ICD-10-CM | POA: Diagnosis not present

## 2022-01-16 DIAGNOSIS — I10 Essential (primary) hypertension: Secondary | ICD-10-CM

## 2022-01-16 DIAGNOSIS — Z7984 Long term (current) use of oral hypoglycemic drugs: Secondary | ICD-10-CM | POA: Insufficient documentation

## 2022-01-16 DIAGNOSIS — I5042 Chronic combined systolic (congestive) and diastolic (congestive) heart failure: Secondary | ICD-10-CM | POA: Diagnosis not present

## 2022-01-16 DIAGNOSIS — N183 Chronic kidney disease, stage 3 unspecified: Secondary | ICD-10-CM | POA: Insufficient documentation

## 2022-01-16 DIAGNOSIS — I5022 Chronic systolic (congestive) heart failure: Secondary | ICD-10-CM | POA: Diagnosis present

## 2022-01-16 DIAGNOSIS — E119 Type 2 diabetes mellitus without complications: Secondary | ICD-10-CM

## 2022-01-16 DIAGNOSIS — G4733 Obstructive sleep apnea (adult) (pediatric): Secondary | ICD-10-CM | POA: Diagnosis not present

## 2022-01-16 DIAGNOSIS — Z8673 Personal history of transient ischemic attack (TIA), and cerebral infarction without residual deficits: Secondary | ICD-10-CM | POA: Insufficient documentation

## 2022-01-16 NOTE — Patient Instructions (Addendum)
Resume weighing daily and call for an overnight weight gain of 3 pounds or more or a weekly weight gain of more than 5 pounds.   If you have voicemail, please make sure your mailbox is cleaned out so that we may leave a message and please make sure to listen to any voicemails.     

## 2022-01-17 ENCOUNTER — Other Ambulatory Visit: Payer: Self-pay | Admitting: Family

## 2022-01-21 ENCOUNTER — Emergency Department
Admission: EM | Admit: 2022-01-21 | Discharge: 2022-01-21 | Disposition: A | Discharge status: Home / Self Care | Payer: Medicare HMO | Attending: Emergency Medicine | Admitting: Emergency Medicine

## 2022-01-21 ENCOUNTER — Other Ambulatory Visit: Payer: Self-pay

## 2022-01-21 ENCOUNTER — Emergency Department: Payer: Medicare HMO

## 2022-01-21 DIAGNOSIS — I480 Paroxysmal atrial fibrillation: Secondary | ICD-10-CM | POA: Diagnosis not present

## 2022-01-21 DIAGNOSIS — I130 Hypertensive heart and chronic kidney disease with heart failure and stage 1 through stage 4 chronic kidney disease, or unspecified chronic kidney disease: Secondary | ICD-10-CM | POA: Insufficient documentation

## 2022-01-21 DIAGNOSIS — Z7901 Long term (current) use of anticoagulants: Secondary | ICD-10-CM | POA: Diagnosis not present

## 2022-01-21 DIAGNOSIS — I509 Heart failure, unspecified: Secondary | ICD-10-CM | POA: Diagnosis not present

## 2022-01-21 DIAGNOSIS — R1031 Right lower quadrant pain: Secondary | ICD-10-CM | POA: Diagnosis present

## 2022-01-21 DIAGNOSIS — J449 Chronic obstructive pulmonary disease, unspecified: Secondary | ICD-10-CM | POA: Insufficient documentation

## 2022-01-21 DIAGNOSIS — I2510 Atherosclerotic heart disease of native coronary artery without angina pectoris: Secondary | ICD-10-CM | POA: Insufficient documentation

## 2022-01-21 DIAGNOSIS — N189 Chronic kidney disease, unspecified: Secondary | ICD-10-CM | POA: Insufficient documentation

## 2022-01-21 DIAGNOSIS — R109 Unspecified abdominal pain: Secondary | ICD-10-CM

## 2022-01-21 LAB — URINALYSIS, ROUTINE W REFLEX MICROSCOPIC
Bacteria, UA: NONE SEEN
Bilirubin Urine: NEGATIVE
Glucose, UA: >=500 mg/dL — AB
Hgb urine dipstick: NEGATIVE
Ketones, ur: NEGATIVE mg/dL
Leukocytes,Ua: NEGATIVE
Nitrite: NEGATIVE
Protein, ur: 30 mg/dL — AB
Specific Gravity, Urine: 1.021 (ref 1.005–1.030)
pH: 5 (ref 5.0–8.0)

## 2022-01-21 LAB — CBC
HCT: 42.6 % (ref 39.0–52.0)
Hemoglobin: 13.5 g/dL (ref 13.0–17.0)
MCH: 27.8 pg (ref 26.0–34.0)
MCHC: 31.7 g/dL (ref 30.0–36.0)
MCV: 87.7 fL (ref 80.0–100.0)
Platelets: 251 10*3/uL (ref 150–400)
RBC: 4.86 MIL/uL (ref 4.22–5.81)
RDW: 12.8 % (ref 11.5–15.5)
WBC: 8.5 10*3/uL (ref 4.0–10.5)
nRBC: 0 % (ref 0.0–0.2)

## 2022-01-21 LAB — LIPASE, BLOOD: Lipase: 111 U/L — ABNORMAL HIGH (ref 11–51)

## 2022-01-21 LAB — HEPATIC FUNCTION PANEL
ALT: 16 U/L (ref 0–44)
AST: 19 U/L (ref 15–41)
Albumin: 3.8 g/dL (ref 3.5–5.0)
Alkaline Phosphatase: 70 U/L (ref 38–126)
Bilirubin, Direct: <0.1 mg/dL (ref 0.0–0.2)
Total Bilirubin: 0.6 mg/dL (ref 0.3–1.2)
Total Protein: 7 g/dL (ref 6.5–8.1)

## 2022-01-21 LAB — BASIC METABOLIC PANEL
Anion gap: 3 — ABNORMAL LOW (ref 5–15)
BUN: 27 mg/dL — ABNORMAL HIGH (ref 6–20)
CO2: 26 mmol/L (ref 22–32)
Calcium: 9.1 mg/dL (ref 8.9–10.3)
Chloride: 110 mmol/L (ref 98–111)
Creatinine, Ser: 1.62 mg/dL — ABNORMAL HIGH (ref 0.61–1.24)
GFR, Estimated: 51 mL/min — ABNORMAL LOW (ref >60)
Glucose, Bld: 136 mg/dL — ABNORMAL HIGH (ref 70–99)
Potassium: 4 mmol/L (ref 3.5–5.1)
Sodium: 139 mmol/L (ref 135–145)

## 2022-01-21 MED ORDER — OXYCODONE HCL 5 MG PO TABS: 5.0000 mg | ORAL_TABLET | Freq: Three times a day (TID) | ORAL | 0 refills | Status: DC | PRN

## 2022-01-21 MED ORDER — ACETAMINOPHEN 500 MG PO TABS
1000.0000 mg | ORAL_TABLET | Freq: Once | ORAL | Status: AC
  Administered 2022-01-21: 1000 mg via ORAL
  Filled 2022-01-21: qty 2

## 2022-01-21 MED ORDER — MUSCLE RUB 10-15 % EX CREA: 1.0000 | TOPICAL_CREAM | CUTANEOUS | 0 refills | Status: AC | PRN

## 2022-01-21 NOTE — ED Notes (Signed)
Pt placed on hospital O2 tank, pt wears 2L at home, but requested me to put the tank at 1L because its "dry air"

## 2022-01-21 NOTE — Discharge Instructions (Addendum)
Call your kidney doctor Dr Smith Mince for a follow up and to check your kidney tests and review imaging obtained today.   Take tylenol 650 mg every 6 hours for pain.  Take oxcodone for severe/breaththrough pain.   Use muscle rub for your back pain.   Thank you for choosing [place] for your health care today!  Please see your primary doctor this week for a follow up appointment.   Sometimes, in the early stages of certain disease courses it is difficult to detect in the emergency department evaluation -- so, it is important that you continue to monitor your symptoms and call your doctor right away or return to the emergency department if you develop any new or worsening symptoms.  Please go to the following website to schedule new (and existing) patient appointments:   [email or web address]  If you do not have a primary doctor try calling the following clinics to establish care:  If you have insurance:  [facility] [phone] [place] [address]   [facility]  [phone] [place] [place] [address]   If you do not have insurance:  [facility]  [phone] [address] [place] [address]   The following is another list of primary care offices in the area who are accepting new patients at this time.  Please reach out to one of them directly and let them know you would like to schedule an appointment to follow up on an Emergency Department visit, and/or to establish a new primary care provider (PCP).  There are likely other primary care clinics in the are who are accepting new patients, but this is an excellent place to start:  [place] physician: Dr Lavon Paganini [address] [place], [place] [address] [phone]  [facility] Lead Physician: Dr Steele Sizer [address], [place], [place] [address] [phone]  [place] Physician: Dr Park Liter [address] [place], [place] [address] [phone]  [facility] Lead Physician: Dr Dewaine Oats [place], [place], [place] [address] [phone]  [facility] Physician: Dr Halina Maidens [address] [place], [place] [address] [phone]   It was my pleasure to care for you today.   Hoover Brunette Jacelyn Grip, MD

## 2022-01-21 NOTE — ED Provider Notes (Signed)
[facility] Provider Note    Event Date/Time   First MD Initiated Contact with Patient 01/21/22 0845     (approximate)   History   Flank Pain   HPI  [name] is a 51 y.o. male   Past medical history of CHF, CKD, COPD, diabetes, hypertension hyperlipidemia, CAD, paroxysmal A-fib on Eliquis who presents with right flank pain.  Last 2 days.  No exacerbating or alleviating symptoms.  No urinary symptoms.  No testicular pain.  No abdominal pain nausea or vomiting.    He offers no other acute medical complaints.  History was obtained via the patient.      Physical Exam   Triage Vital Signs: ED Triage Vitals  Enc Vitals Group     BP 01/21/22 0600 (!) 136/95     Pulse Rate 01/21/22 0600 85     Resp 01/21/22 0600 20     Temp 01/21/22 0600 98.7 F (37.1 C)     Temp Source 01/21/22 0600 Oral     SpO2 01/21/22 0600 96 %     Weight 01/21/22 0601 259 lb (117.5 kg)     Height 01/21/22 0601 '6\' 1"'$  (1.854 m)     Head Circumference --      Peak Flow --      Pain Score 01/21/22 0601 9     Pain Loc --      Pain Edu? --      Excl. in [place]? --     Most recent vital signs: Vitals:   01/21/22 0600 01/21/22 0856  BP: (!) 136/95 117/66  Pulse: 85 72  Resp: 20 20  Temp: 98.7 F (37.1 C)   SpO2: 96% 95%    General: Awake, no distress.  CV:  Good peripheral perfusion.  Resp:  Normal effort.  Abd:  No distention.  Other:  Has no tenderness to palpation in the abdomen in all quadrants to deep palpation.  He appears awake alert oriented and pleasant.  His hemodynamics are appropriate and reassuring without fever.  He does have tenderness to the right lumbar paraspinal area.   ED Results / Procedures / Treatments   Labs (all labs ordered are listed, but only abnormal results are displayed) Labs Reviewed  URINALYSIS, ROUTINE W REFLEX MICROSCOPIC - Abnormal; Notable for the following components:      Result Value   Color, Urine YELLOW (*)     APPearance CLEAR (*)    Glucose, UA >=500 (*)    Protein, ur 30 (*)    All other components within normal limits  BASIC METABOLIC PANEL - Abnormal; Notable for the following components:   Glucose, Bld 136 (*)    BUN 27 (*)    Creatinine, Ser 1.62 (*)    GFR, Estimated 51 (*)    Anion gap 3 (*)    All other components within normal limits  LIPASE, BLOOD - Abnormal; Notable for the following components:   Lipase 111 (*)    All other components within normal limits  CBC  HEPATIC FUNCTION PANEL     I reviewed labs and they are notable for urinalysis without signs of infection bacteria leukocyte, his creatinine is 1.6 compared to 1.5/1.6 in the last year.    RADIOLOGY I independently reviewed and interpreted CT of the abdomen pelvis and see a gallstone without obvious signs of cholecystitis, no other overt signs of infection or obstruction   PROCEDURES:  Critical Care performed: No  Procedures   MEDICATIONS  ORDERED IN ED: Medications - No data to display   IMPRESSION / MDM / [place] / ED COURSE  I reviewed the triage vital signs and the nursing notes.                              Differential diagnosis includes, but is not limited to, pyelonephritis, renal colic, nephrolithiasis, renal infarction, cholelithiasis or cystitis or other intra-abdominal infection, lumbar strain musculoskeletal pain    MDM: This patient had some cysts on his right kidney as well as cholelithiasis without cholecystitis and a benign abdominal exam.  Urinalysis shows no signs of infection.  He is on anticoagulation so I doubt he infarcted his kidney.  He has tenderness to the right lumbar area this may be musculoskeletal.  Since the CT shows no emergent pathologies and his kidney function appears at baseline and urinalysis shows no signs of infection and he has no urinary or infectious symptoms, imaging shows no signs of cholecystitis and he has no right upper quadrant tenderness to  palpation I think he can be discharged home with conservative care and he will follow-up with his nephrologist to review imaging kidney cysts today.  He was given strict return precautions for any new or worsening signs or symptoms.   Patient's presentation is most consistent with acute presentation with potential threat to life or bodily function.       FINAL CLINICAL IMPRESSION(S) / ED DIAGNOSES   Final diagnoses:  Right flank pain     Rx / DC Orders   ED Discharge Orders          Ordered    Menthol-Methyl Salicylate (MUSCLE RUB) 10-15 % CREA  As needed        01/21/22 0909    oxyCODONE (ROXICODONE) 5 MG immediate release tablet  Every 8 hours PRN        01/21/22 0909             Note:  This document was prepared using Dragon voice recognition software and may include unintentional dictation errors.    Lucillie Garfinkel, MD 01/21/22 [phone]

## 2022-01-21 NOTE — ED Triage Notes (Signed)
Pt reports 9/10 pain in right lower back wrapping around flank beginning suddenly while sitting on Friday. Has been constant since. Denies any urinary symptoms/fever/SOB/Weakness/fatigue/N/V.

## 2022-01-21 NOTE — ED Notes (Signed)
Wheelchair to lobby and assisted to car by security. Pt leaves with stable VS and attached himself to home oxygen.  All questions answered, pt verbalizes understanding of all discharge instructions, follow up and return precautions.

## 2022-01-28 ENCOUNTER — Other Ambulatory Visit: Payer: Self-pay

## 2022-01-28 ENCOUNTER — Emergency Department: Payer: Medicare HMO

## 2022-01-28 DIAGNOSIS — J189 Pneumonia, unspecified organism: Principal | ICD-10-CM | POA: Diagnosis present

## 2022-01-28 DIAGNOSIS — I480 Paroxysmal atrial fibrillation: Secondary | ICD-10-CM | POA: Diagnosis present

## 2022-01-28 DIAGNOSIS — R000 Tachycardia, unspecified: Secondary | ICD-10-CM | POA: Diagnosis present

## 2022-01-28 DIAGNOSIS — J440 Chronic obstructive pulmonary disease with acute lower respiratory infection: Secondary | ICD-10-CM | POA: Diagnosis present

## 2022-01-28 DIAGNOSIS — I252 Old myocardial infarction: Secondary | ICD-10-CM

## 2022-01-28 DIAGNOSIS — J9621 Acute and chronic respiratory failure with hypoxia: Secondary | ICD-10-CM | POA: Diagnosis present

## 2022-01-28 DIAGNOSIS — N1831 Chronic kidney disease, stage 3a: Secondary | ICD-10-CM | POA: Diagnosis present

## 2022-01-28 DIAGNOSIS — I2510 Atherosclerotic heart disease of native coronary artery without angina pectoris: Secondary | ICD-10-CM | POA: Diagnosis present

## 2022-01-28 DIAGNOSIS — Z8249 Family history of ischemic heart disease and other diseases of the circulatory system: Secondary | ICD-10-CM

## 2022-01-28 DIAGNOSIS — Z87891 Personal history of nicotine dependence: Secondary | ICD-10-CM

## 2022-01-28 DIAGNOSIS — E669 Obesity, unspecified: Secondary | ICD-10-CM | POA: Diagnosis present

## 2022-01-28 DIAGNOSIS — Z79899 Other long term (current) drug therapy: Secondary | ICD-10-CM

## 2022-01-28 DIAGNOSIS — Z7951 Long term (current) use of inhaled steroids: Secondary | ICD-10-CM

## 2022-01-28 DIAGNOSIS — Z9049 Acquired absence of other specified parts of digestive tract: Secondary | ICD-10-CM

## 2022-01-28 DIAGNOSIS — Z8673 Personal history of transient ischemic attack (TIA), and cerebral infarction without residual deficits: Secondary | ICD-10-CM

## 2022-01-28 DIAGNOSIS — Z930 Tracheostomy status: Secondary | ICD-10-CM

## 2022-01-28 DIAGNOSIS — Z20822 Contact with and (suspected) exposure to covid-19: Secondary | ICD-10-CM | POA: Diagnosis present

## 2022-01-28 DIAGNOSIS — E1122 Type 2 diabetes mellitus with diabetic chronic kidney disease: Secondary | ICD-10-CM | POA: Diagnosis present

## 2022-01-28 DIAGNOSIS — D860 Sarcoidosis of lung: Secondary | ICD-10-CM | POA: Diagnosis present

## 2022-01-28 DIAGNOSIS — Z7901 Long term (current) use of anticoagulants: Secondary | ICD-10-CM

## 2022-01-28 DIAGNOSIS — Z880 Allergy status to penicillin: Secondary | ICD-10-CM

## 2022-01-28 DIAGNOSIS — R5381 Other malaise: Secondary | ICD-10-CM | POA: Diagnosis present

## 2022-01-28 DIAGNOSIS — Z794 Long term (current) use of insulin: Secondary | ICD-10-CM

## 2022-01-28 DIAGNOSIS — I5022 Chronic systolic (congestive) heart failure: Secondary | ICD-10-CM | POA: Diagnosis present

## 2022-01-28 DIAGNOSIS — Z6834 Body mass index (BMI) 34.0-34.9, adult: Secondary | ICD-10-CM

## 2022-01-28 DIAGNOSIS — Z7982 Long term (current) use of aspirin: Secondary | ICD-10-CM

## 2022-01-28 DIAGNOSIS — E7800 Pure hypercholesterolemia, unspecified: Secondary | ICD-10-CM | POA: Diagnosis present

## 2022-01-28 DIAGNOSIS — Z905 Acquired absence of kidney: Secondary | ICD-10-CM

## 2022-01-28 DIAGNOSIS — Z85528 Personal history of other malignant neoplasm of kidney: Secondary | ICD-10-CM

## 2022-01-28 DIAGNOSIS — Z7984 Long term (current) use of oral hypoglycemic drugs: Secondary | ICD-10-CM

## 2022-01-28 DIAGNOSIS — I130 Hypertensive heart and chronic kidney disease with heart failure and stage 1 through stage 4 chronic kidney disease, or unspecified chronic kidney disease: Secondary | ICD-10-CM | POA: Diagnosis present

## 2022-01-28 DIAGNOSIS — G4733 Obstructive sleep apnea (adult) (pediatric): Secondary | ICD-10-CM | POA: Diagnosis present

## 2022-01-28 DIAGNOSIS — Z833 Family history of diabetes mellitus: Secondary | ICD-10-CM

## 2022-01-28 DIAGNOSIS — R0602 Shortness of breath: Secondary | ICD-10-CM | POA: Diagnosis not present

## 2022-01-28 NOTE — ED Provider Triage Note (Signed)
Emergency Medicine Provider Triage Evaluation Note  [name] , a 51 y.o. male  was evaluated in triage.  Pt complains of chest pain or shortness of breath.  Review of Systems  Positive:  Negative:   Physical Exam  BP 118/72 (BP Location: Left Arm)   Pulse (!) 101   Temp 98.9 F (37.2 C) (Oral)   Resp (!) 24   SpO2 100%  Gen:   Awake, no distress   Resp:  Mild tachypnea, wheezing throughout, poor airflow. MSK:   Moves extremities without difficulty  Other:    Medical Decision Making  Medically screening exam initiated at 11:45 PM.  Appropriate orders placed.  Kate Sable was informed that the remainder of the evaluation will be completed by another provider, this initial triage assessment does not replace that evaluation, and the importance of remaining in the ED until their evaluation is complete.    Vladimir Crofts, MD 01/28/22 [phone]

## 2022-01-28 NOTE — ED Triage Notes (Signed)
Pt BIB EMS c/o SOB x 3 days and CP that started today. Pt states dull left-sided chest pain. Pain associated with dizziness and cough. Pt presents to ED chronic 2 L nasal cannula, audible congestion, chronic tracheostomy collar. Pt requesting suction.

## 2022-01-28 NOTE — ED Triage Notes (Signed)
First nurse note To triage via [facility] with c/o chest pain, starting 915pm then resolved. Recurred about 10pm. Pain left side, non radiating.  Per EMS Dyspnea with extetion, trach pt wears o2 at 2L [address] [place] 1 Nitro spray 20G R wrist

## 2022-01-29 ENCOUNTER — Other Ambulatory Visit: Payer: Self-pay

## 2022-01-29 ENCOUNTER — Inpatient Hospital Stay
Admission: EM | Admit: 2022-01-29 | Discharge: 2022-02-02 | DRG: 208 | Disposition: A | Discharge status: Home / Self Care | Payer: Medicare HMO | Attending: Internal Medicine | Admitting: Internal Medicine

## 2022-01-29 ENCOUNTER — Emergency Department: Payer: Medicare HMO

## 2022-01-29 DIAGNOSIS — I2510 Atherosclerotic heart disease of native coronary artery without angina pectoris: Secondary | ICD-10-CM | POA: Diagnosis present

## 2022-01-29 DIAGNOSIS — Z930 Tracheostomy status: Secondary | ICD-10-CM | POA: Diagnosis not present

## 2022-01-29 DIAGNOSIS — Z8673 Personal history of transient ischemic attack (TIA), and cerebral infarction without residual deficits: Secondary | ICD-10-CM | POA: Diagnosis not present

## 2022-01-29 DIAGNOSIS — D860 Sarcoidosis of lung: Secondary | ICD-10-CM | POA: Diagnosis present

## 2022-01-29 DIAGNOSIS — I480 Paroxysmal atrial fibrillation: Secondary | ICD-10-CM | POA: Diagnosis present

## 2022-01-29 DIAGNOSIS — Z833 Family history of diabetes mellitus: Secondary | ICD-10-CM | POA: Diagnosis not present

## 2022-01-29 DIAGNOSIS — N1831 Chronic kidney disease, stage 3a: Secondary | ICD-10-CM | POA: Diagnosis present

## 2022-01-29 DIAGNOSIS — Z794 Long term (current) use of insulin: Secondary | ICD-10-CM | POA: Diagnosis not present

## 2022-01-29 DIAGNOSIS — I5020 Unspecified systolic (congestive) heart failure: Secondary | ICD-10-CM

## 2022-01-29 DIAGNOSIS — R0602 Shortness of breath: Secondary | ICD-10-CM | POA: Diagnosis present

## 2022-01-29 DIAGNOSIS — Z87891 Personal history of nicotine dependence: Secondary | ICD-10-CM | POA: Diagnosis not present

## 2022-01-29 DIAGNOSIS — E669 Obesity, unspecified: Secondary | ICD-10-CM | POA: Diagnosis present

## 2022-01-29 DIAGNOSIS — I4819 Other persistent atrial fibrillation: Secondary | ICD-10-CM | POA: Diagnosis not present

## 2022-01-29 DIAGNOSIS — I5022 Chronic systolic (congestive) heart failure: Secondary | ICD-10-CM | POA: Diagnosis present

## 2022-01-29 DIAGNOSIS — Z20822 Contact with and (suspected) exposure to covid-19: Secondary | ICD-10-CM | POA: Diagnosis present

## 2022-01-29 DIAGNOSIS — E7800 Pure hypercholesterolemia, unspecified: Secondary | ICD-10-CM | POA: Diagnosis present

## 2022-01-29 DIAGNOSIS — Z85528 Personal history of other malignant neoplasm of kidney: Secondary | ICD-10-CM | POA: Diagnosis not present

## 2022-01-29 DIAGNOSIS — J9611 Chronic respiratory failure with hypoxia: Secondary | ICD-10-CM | POA: Diagnosis not present

## 2022-01-29 DIAGNOSIS — I130 Hypertensive heart and chronic kidney disease with heart failure and stage 1 through stage 4 chronic kidney disease, or unspecified chronic kidney disease: Secondary | ICD-10-CM | POA: Diagnosis present

## 2022-01-29 DIAGNOSIS — Z7901 Long term (current) use of anticoagulants: Secondary | ICD-10-CM | POA: Diagnosis not present

## 2022-01-29 DIAGNOSIS — Z79899 Other long term (current) drug therapy: Secondary | ICD-10-CM | POA: Diagnosis not present

## 2022-01-29 DIAGNOSIS — I4811 Longstanding persistent atrial fibrillation: Secondary | ICD-10-CM

## 2022-01-29 DIAGNOSIS — J189 Pneumonia, unspecified organism: Secondary | ICD-10-CM | POA: Diagnosis present

## 2022-01-29 DIAGNOSIS — E1122 Type 2 diabetes mellitus with diabetic chronic kidney disease: Secondary | ICD-10-CM | POA: Diagnosis present

## 2022-01-29 DIAGNOSIS — Z8249 Family history of ischemic heart disease and other diseases of the circulatory system: Secondary | ICD-10-CM | POA: Diagnosis not present

## 2022-01-29 DIAGNOSIS — Z6834 Body mass index (BMI) 34.0-34.9, adult: Secondary | ICD-10-CM | POA: Diagnosis not present

## 2022-01-29 DIAGNOSIS — J440 Chronic obstructive pulmonary disease with acute lower respiratory infection: Secondary | ICD-10-CM | POA: Diagnosis present

## 2022-01-29 DIAGNOSIS — I252 Old myocardial infarction: Secondary | ICD-10-CM | POA: Diagnosis not present

## 2022-01-29 DIAGNOSIS — J9691 Respiratory failure, unspecified with hypoxia: Secondary | ICD-10-CM | POA: Diagnosis present

## 2022-01-29 DIAGNOSIS — J9621 Acute and chronic respiratory failure with hypoxia: Secondary | ICD-10-CM | POA: Diagnosis present

## 2022-01-29 LAB — CBC
HCT: 39.4 % (ref 39.0–52.0)
Hemoglobin: 12.4 g/dL — ABNORMAL LOW (ref 13.0–17.0)
MCH: 28 pg (ref 26.0–34.0)
MCHC: 31.5 g/dL (ref 30.0–36.0)
MCV: 88.9 fL (ref 80.0–100.0)
Platelets: 200 10*3/uL (ref 150–400)
RBC: 4.43 MIL/uL (ref 4.22–5.81)
RDW: 13.2 % (ref 11.5–15.5)
WBC: 8.5 10*3/uL (ref 4.0–10.5)
nRBC: 0 % (ref 0.0–0.2)

## 2022-01-29 LAB — BLOOD GAS, VENOUS
Acid-base deficit: 1.8 mmol/L (ref 0.0–2.0)
Bicarbonate: 25.8 mmol/L (ref 20.0–28.0)
O2 Saturation: 86.2 %
Patient temperature: 37
pCO2, Ven: 55 mmHg (ref 44–60)
pH, Ven: 7.28 (ref 7.25–7.43)
pO2, Ven: 58 mmHg — ABNORMAL HIGH (ref 32–45)

## 2022-01-29 LAB — BASIC METABOLIC PANEL
Anion gap: 6 (ref 5–15)
BUN: 16 mg/dL (ref 6–20)
CO2: 27 mmol/L (ref 22–32)
Calcium: 8.4 mg/dL — ABNORMAL LOW (ref 8.9–10.3)
Chloride: 108 mmol/L (ref 98–111)
Creatinine, Ser: 1.54 mg/dL — ABNORMAL HIGH (ref 0.61–1.24)
GFR, Estimated: 54 mL/min — ABNORMAL LOW (ref >60)
Glucose, Bld: 129 mg/dL — ABNORMAL HIGH (ref 70–99)
Potassium: 4 mmol/L (ref 3.5–5.1)
Sodium: 141 mmol/L (ref 135–145)

## 2022-01-29 LAB — RESP PANEL BY RT-PCR (RSV, FLU A&B, COVID)  RVPGX2
Influenza A by PCR: NEGATIVE
Influenza B by PCR: NEGATIVE
Resp Syncytial Virus by PCR: NEGATIVE
SARS Coronavirus 2 by RT PCR: NEGATIVE

## 2022-01-29 LAB — TROPONIN I (HIGH SENSITIVITY)
Troponin I (High Sensitivity): 28 ng/L — ABNORMAL HIGH (ref <18)
Troponin I (High Sensitivity): 30 ng/L — ABNORMAL HIGH (ref <18)

## 2022-01-29 LAB — LIPASE, BLOOD: Lipase: 73 U/L — ABNORMAL HIGH (ref 11–51)

## 2022-01-29 LAB — HEPATIC FUNCTION PANEL
ALT: 20 U/L (ref 0–44)
AST: 23 U/L (ref 15–41)
Albumin: 3.6 g/dL (ref 3.5–5.0)
Alkaline Phosphatase: 66 U/L (ref 38–126)
Bilirubin, Direct: 0.1 mg/dL (ref 0.0–0.2)
Indirect Bilirubin: 0.7 mg/dL (ref 0.3–0.9)
Total Bilirubin: 0.8 mg/dL (ref 0.3–1.2)
Total Protein: 7.3 g/dL (ref 6.5–8.1)

## 2022-01-29 LAB — MRSA NEXT GEN BY PCR, NASAL: MRSA by PCR Next Gen: NOT DETECTED

## 2022-01-29 LAB — CBG MONITORING, ED: Glucose-Capillary: 128 mg/dL — ABNORMAL HIGH (ref 70–99)

## 2022-01-29 LAB — GLUCOSE, CAPILLARY: Glucose-Capillary: 105 mg/dL — ABNORMAL HIGH (ref 70–99)

## 2022-01-29 MED ORDER — IPRATROPIUM-ALBUTEROL 0.5-2.5 (3) MG/3ML IN SOLN
3.0000 mL | RESPIRATORY_TRACT | Status: DC | PRN
  Administered 2022-01-29 – 2022-01-31 (×5): 3 mL via RESPIRATORY_TRACT
  Filled 2022-01-29 (×5): qty 3

## 2022-01-29 MED ORDER — ATORVASTATIN CALCIUM 20 MG PO TABS
40.0000 mg | ORAL_TABLET | Freq: Every day | ORAL | Status: DC
  Administered 2022-01-29 – 2022-02-01 (×4): 40 mg via ORAL
  Filled 2022-01-29 (×4): qty 2

## 2022-01-29 MED ORDER — SACUBITRIL-VALSARTAN 24-26 MG PO TABS
1.0000 | ORAL_TABLET | Freq: Two times a day (BID) | ORAL | Status: DC
  Administered 2022-01-29 – 2022-02-02 (×8): 1 via ORAL
  Filled 2022-01-29 (×9): qty 1

## 2022-01-29 MED ORDER — ASPIRIN 81 MG PO TBEC
81.0000 mg | DELAYED_RELEASE_TABLET | Freq: Every day | ORAL | Status: DC
  Administered 2022-01-30 – 2022-02-02 (×4): 81 mg via ORAL
  Filled 2022-01-29 (×4): qty 1

## 2022-01-29 MED ORDER — VANCOMYCIN HCL 2000 MG/400ML IV SOLN
2000.0000 mg | Freq: Once | INTRAVENOUS | Status: AC
  Administered 2022-01-29: 2000 mg via INTRAVENOUS
  Filled 2022-01-29: qty 400

## 2022-01-29 MED ORDER — POLYETHYLENE GLYCOL 3350 17 G PO PACK: 17.0000 g | PACK | Freq: Every day | ORAL | Status: DC | PRN

## 2022-01-29 MED ORDER — SODIUM CHLORIDE 0.9 % IV BOLUS
1000.0000 mL | Freq: Once | INTRAVENOUS | Status: AC
  Administered 2022-01-29: 1000 mL via INTRAVENOUS

## 2022-01-29 MED ORDER — ALBUTEROL SULFATE (2.5 MG/3ML) 0.083% IN NEBU
2.5000 mg | INHALATION_SOLUTION | Freq: Once | RESPIRATORY_TRACT | Status: AC
  Administered 2022-01-29: 2.5 mg via RESPIRATORY_TRACT
  Filled 2022-01-29: qty 3

## 2022-01-29 MED ORDER — INSULIN ASPART 100 UNIT/ML IJ SOLN
0.0000 [IU] | INTRAMUSCULAR | Status: DC
  Administered 2022-01-30 (×2): 2 [IU] via SUBCUTANEOUS
  Filled 2022-01-29 (×2): qty 1

## 2022-01-29 MED ORDER — DM-GUAIFENESIN ER 30-600 MG PO TB12: 1.0000 | ORAL_TABLET | Freq: Two times a day (BID) | ORAL | Status: DC

## 2022-01-29 MED ORDER — IPRATROPIUM-ALBUTEROL 0.5-2.5 (3) MG/3ML IN SOLN
6.0000 mL | Freq: Once | RESPIRATORY_TRACT | Status: AC
  Administered 2022-01-29: 6 mL via RESPIRATORY_TRACT
  Filled 2022-01-29: qty 6

## 2022-01-29 MED ORDER — METOPROLOL SUCCINATE ER 50 MG PO TB24
100.0000 mg | ORAL_TABLET | Freq: Every day | ORAL | Status: DC
  Administered 2022-01-29 – 2022-02-02 (×5): 100 mg via ORAL
  Filled 2022-01-29 (×5): qty 2

## 2022-01-29 MED ORDER — SPIRONOLACTONE 25 MG PO TABS
25.0000 mg | ORAL_TABLET | Freq: Every day | ORAL | Status: DC
  Administered 2022-01-30 – 2022-02-02 (×4): 25 mg via ORAL
  Filled 2022-01-29 (×4): qty 1

## 2022-01-29 MED ORDER — SODIUM CHLORIDE 0.9 % IV SOLN
1.0000 g | Freq: Once | INTRAVENOUS | Status: AC
  Administered 2022-01-29: 1 g via INTRAVENOUS
  Filled 2022-01-29: qty 20

## 2022-01-29 MED ORDER — CHLORHEXIDINE GLUCONATE CLOTH 2 % EX PADS
6.0000 | MEDICATED_PAD | Freq: Every day | CUTANEOUS | Status: DC
  Administered 2022-01-29 – 2022-01-31 (×2): 6 via TOPICAL
  Filled 2022-01-29: qty 6

## 2022-01-29 MED ORDER — SODIUM CHLORIDE 0.9 % IV SOLN
2.0000 g | Freq: Three times a day (TID) | INTRAVENOUS | Status: DC
  Administered 2022-01-29 – 2022-02-02 (×12): 2 g via INTRAVENOUS
  Filled 2022-01-29: qty 2
  Filled 2022-01-29 (×8): qty 12.5
  Filled 2022-01-29: qty 2
  Filled 2022-01-29 (×3): qty 12.5

## 2022-01-29 MED ORDER — FUROSEMIDE 20 MG PO TABS
40.0000 mg | ORAL_TABLET | Freq: Every day | ORAL | Status: DC
  Administered 2022-01-29 – 2022-01-30 (×2): 40 mg via ORAL
  Filled 2022-01-29 (×5): qty 2

## 2022-01-29 MED ORDER — DOCUSATE SODIUM 100 MG PO CAPS: 100.0000 mg | ORAL_CAPSULE | Freq: Two times a day (BID) | ORAL | Status: DC | PRN

## 2022-01-29 MED ORDER — METHYLPREDNISOLONE SODIUM SUCC 125 MG IJ SOLR
125.0000 mg | Freq: Once | INTRAMUSCULAR | Status: AC
  Administered 2022-01-29: 125 mg via INTRAVENOUS
  Filled 2022-01-29: qty 2

## 2022-01-29 MED ORDER — IPRATROPIUM-ALBUTEROL 0.5-2.5 (3) MG/3ML IN SOLN
3.0000 mL | Freq: Once | RESPIRATORY_TRACT | Status: AC
  Administered 2022-01-29: 3 mL via RESPIRATORY_TRACT
  Filled 2022-01-29: qty 6

## 2022-01-29 NOTE — ED Notes (Signed)
RT to bedside to take pt off of vent per pt request.

## 2022-01-29 NOTE — ED Notes (Signed)
Pt resting comfortably. Respirations even and unlabored.

## 2022-01-29 NOTE — ED Provider Notes (Addendum)
[facility] Provider Note    Event Date/Time   First MD Initiated Contact with Patient 01/29/22 [phone]     (approximate)   History   Chest Pain and Shortness of Breath   HPI  [name] is a 51 y.o. male   with PMHx CHF, CKD, COPD, hypertension, hyperlipidemia, coronary disease, here with shortness of breath.  Patient states that over the last 2 to 3 days, has had progressive worsening fever, cough, and shortness of breath.  Patient was given DuoNebs with some mild improvement.  He has a history of recently diagnosed pneumonia for which she was treated with antibiotics.  He states that he did not have much improvement and has now had return of worsening symptoms.  He feels like he has sputum that he cannot get up.  Denies any known fevers.  No nausea or vomiting.  No other complaints.      Physical Exam   Triage Vital Signs: ED Triage Vitals  Enc Vitals Group     BP 01/28/22 2337 118/72     Pulse Rate 01/28/22 2337 (!) 101     Resp 01/28/22 2337 (!) 24     Temp 01/28/22 2337 98.9 F (37.2 C)     Temp Source 01/28/22 2337 Oral     SpO2 01/28/22 2337 100 %     Weight 01/29/22 0725 258 lb 13.1 oz (117.4 kg)     Height 01/29/22 0725 '6\' 1"'$  (1.854 m)     Head Circumference --      Peak Flow --      Pain Score 01/28/22 2340 8     Pain Loc --      Pain Edu? --      Excl. in [place]? --     Most recent vital signs: Vitals:   01/29/22 0909 01/29/22 1000  BP:    Pulse: 96   Resp: 20   Temp:    SpO2: 96% 95%     General: Awake, no distress.  CV:  Good peripheral perfusion.  Regula rhythm.  Mild tachycardia noted. Resp:  Tachypnea with diffuse wheezing and diminished aeration.  Trach in place. Abd:  No distention.  Other:  No lower extremity edema.   ED Results / Procedures / Treatments   Labs (all labs ordered are listed, but only abnormal results are displayed) Labs Reviewed  BASIC METABOLIC PANEL - Abnormal; Notable for the following  components:      Result Value   Glucose, Bld 129 (*)    Creatinine, Ser 1.54 (*)    Calcium 8.4 (*)    GFR, Estimated 54 (*)    All other components within normal limits  CBC - Abnormal; Notable for the following components:   Hemoglobin 12.4 (*)    All other components within normal limits  LIPASE, BLOOD - Abnormal; Notable for the following components:   Lipase 73 (*)    All other components within normal limits  BLOOD GAS, VENOUS - Abnormal; Notable for the following components:   pO2, Ven 58 (*)    All other components within normal limits  TROPONIN I (HIGH SENSITIVITY) - Abnormal; Notable for the following components:   Troponin I (High Sensitivity) 28 (*)    All other components within normal limits  TROPONIN I (HIGH SENSITIVITY) - Abnormal; Notable for the following components:   Troponin I (High Sensitivity) 30 (*)    All other components within normal limits  RESP PANEL BY RT-PCR (RSV, FLU  A&B, COVID)  RVPGX2  CULTURE, BLOOD (ROUTINE X 2)  CULTURE, BLOOD (ROUTINE X 2)  CULTURE, RESPIRATORY W GRAM STAIN  HEPATIC FUNCTION PANEL     EKG Normal sinus rhythm, ventricular rate 98.  PR 142, QRS 168, QTc 510.  No acute ST elevations or depressions.  No acute evidence of acute ischemia or infarct.   RADIOLOGY Chest x-ray: Left lung base pleural effusion with infiltrate CT chest: Multifocal pneumonia   I also independently reviewed and agree with radiologist interpretations.   PROCEDURES:  Critical Care performed: No  .1-3 Lead EKG Interpretation  Performed by: Duffy Bruce, MD Authorized by: Duffy Bruce, MD     Interpretation: abnormal     ECG rate:  90-110   ECG rate assessment: tachycardic     Rhythm: sinus tachycardia     Ectopy: none     Conduction: normal   Comments:     Indication: SOB .Critical Care  Performed by: Duffy Bruce, MD Authorized by: Duffy Bruce, MD   Critical care provider statement:    Critical care time (minutes):   30   Critical care was necessary to treat or prevent imminent or life-threatening deterioration of the following conditions:  Cardiac failure, circulatory failure and respiratory failure   Critical care was time spent personally by me on the following activities:  Development of treatment plan with patient or surrogate, discussions with consultants, evaluation of patient's response to treatment, examination of patient, ordering and review of laboratory studies, ordering and review of radiographic studies, ordering and performing treatments and interventions, pulse oximetry, re-evaluation of patient's condition and review of old charts   Care discussed with: admitting provider       [facility] ED: Medications  methylPREDNISolone sodium succinate (SOLU-MEDROL) 125 mg/2 mL injection 125 mg (125 mg Intravenous Given 01/29/22 0236)  ipratropium-albuterol (DUONEB) 0.5-2.5 (3) MG/3ML nebulizer solution 6 mL (6 mLs Nebulization Given 01/29/22 0236)  ipratropium-albuterol (DUONEB) 0.5-2.5 (3) MG/3ML nebulizer solution 6 mL (6 mLs Nebulization Given 01/29/22 0539)  meropenem (MERREM) 1 g in sodium chloride 0.9 % 100 mL IVPB (0 g Intravenous Stopped 01/29/22 0909)  vancomycin (VANCOREADY) IVPB 2000 mg/400 mL (2,000 mg Intravenous New Bag/Given 01/29/22 0833)  ipratropium-albuterol (DUONEB) 0.5-2.5 (3) MG/3ML nebulizer solution 3 mL (3 mLs Nebulization Given 01/29/22 0815)  sodium chloride 0.9 % bolus 1,000 mL (1,000 mLs Intravenous New Bag/Given 01/29/22 0813)  albuterol (PROVENTIL) (2.5 MG/3ML) 0.083% nebulizer solution 2.5 mg (2.5 mg Nebulization Given 01/29/22 1003)  albuterol (PROVENTIL) (2.5 MG/3ML) 0.083% nebulizer solution 2.5 mg (2.5 mg Nebulization Given 01/29/22 1004)     IMPRESSION / MDM / [place] / ED COURSE  I reviewed the triage vital signs and the nursing notes.                              Differential diagnosis includes, but is not limited to, pneumonia,  aspiration, influenza, covid-19, other URI, PTX, atelectasis, trach complication, anemia, ACS, CHF  Patient's presentation is most consistent with acute presentation with potential threat to life or bodily function.  The patient is on the cardiac monitor to evaluate for evidence of arrhythmia and/or significant heart rate changes.    51 year old male with history of COPD, chronic hypoxia with trach dependence, here with shortness of breath and cough.  Suspect acute on chronic hypoxic respiratory failure secondary to pneumonia.  Patient has diffuse wheezing as well suggesting component of bronchospasm.  IV steroids and  DuoNebs x 3 given.  Initial chest x-ray concerning for possible pneumonia so CT was obtained, reviewed by me and confirms multifocal pneumonia.  Lab work remarkable for creatinine 1.5, hemoglobin 12.4.  Troponin 28 and 30 which appears to be his baseline.  EKG is nonischemic.  Blood culture sent.  Will start the patient on empiric IV antibiotics and admit to the ICU as he is vent dependent at night and has had persistent increased work of breathing so placed on a vent currently for respiratory support.   FINAL CLINICAL IMPRESSION(S) / ED DIAGNOSES   Final diagnoses:  Acute on chronic respiratory failure with hypoxia (HCC)  Multifocal pneumonia     Rx / DC Orders   ED Discharge Orders     None        Note:  This document was prepared using Dragon voice recognition software and may include unintentional dictation errors.   Duffy Bruce, MD 01/29/22 1133    Duffy Bruce, MD 01/29/22 [phone]

## 2022-01-29 NOTE — ED Notes (Signed)
Pt sitting up in bed in NAD. Respirations even and unlabored.

## 2022-01-29 NOTE — ED Notes (Signed)
RT made aware of need for ABG at this time

## 2022-01-29 NOTE — Progress Notes (Signed)
Patient seen earlier in triage area. Patient in for respiratory illness. Trach suctioned for large amount of thick tan secretions. Inner cannula and dressing changed. SVN given. Patient with speaking valve in place. Additional supplies left at bedside. Report passed off the oncoming shift of patient in triage for the moment. Currently on 2liter nasal cannula.

## 2022-01-29 NOTE — ED Notes (Signed)
Pt moved from wheelchair to stretcher in triage 3 room. Pt will stand to use urinal then this RN will get blood cultures and start abx. Pt appears stable, still on 2L (chronic). ED provider did see pt this AM in triage 3 room. Provided blankets. Will repeat VS.

## 2022-01-29 NOTE — ED Notes (Signed)
Pt called out stating more difficulty with breathing. Requesting to go back on vent at this time. RT made aware.

## 2022-01-29 NOTE — ED Notes (Signed)
Urine sample sent to lab

## 2022-01-29 NOTE — H&P (Signed)
NAME:  [name], MRN:  [id number], DOB:  [birth year], LOS: 0 ADMISSION DATE:  01/29/2022, CHIEF COMPLAINT:  Shortness of Breath   History of Present Illness:  51 year old male with a history of chronic hypoxic respiratory failure s/p tracheostomy (chronically trached) who presents to the hospital with increased shortness of breath.  Patient reported to me and to the ED team that he'd been having increased shortness of breath and cough for the past two to three days. He has not had any fevers or chills. Given he is chronically trached, he was concerned he might be developing a pneumonia. Patient was recently treated for pneumonia without much improvement.  In the ED, he was hemodynamically stable. He reported not having slept overnight, and hence, wasn't vented. Patient is placed on the vent nocturnally but is on PMV/red cap during the day. He was placed on the vent and PCCM was consulted for an admission.  Pertinent  Medical History  #COPD on triple therapy #CKD (baseline creatinine 1.5) #HFrEF (GDMT with metoprolol, jardiance, spironolactone and entresto) #HTN #HLD #CAD #Pulmonary Sarcoidosis (in remission) #Afib (on apixaban)  Significant Hospital Events: Including procedures, antibiotic start and stop dates in addition to other pertinent events   01/29/2022: admitted, placed on PSV  Objective   Blood pressure (!) 145/77, pulse 96, temperature 98.4 F (36.9 C), temperature source Oral, resp. rate 20, height '6\' 1"'$  (1.854 m), weight 117.4 kg, SpO2 95 %.    Vent Mode: PRVC FiO2 (%):  [28 %] 28 % Set Rate:  [14 bmp] 14 bmp Vt Set:  [500 mL] 500 mL PEEP:  [5 cmH20] 5 cmH20   Intake/Output Summary (Last 24 hours) at 01/29/2022 1141 Last data filed at 01/29/2022 0818 Gross per 24 hour  Intake --  Output 300 ml  Net -300 ml   Filed Weights   01/29/22 0725  Weight: 117.4 kg    Examination: Physical Exam Constitutional:      General: He is not in acute distress.     Appearance: Normal appearance. He is not ill-appearing.  HENT:     Mouth/Throat:     Mouth: Mucous membranes are moist.  Eyes:     Pupils: Pupils are equal, round, and reactive to light.  Neck:     Comments: Tracheostomy in place, clean, no secretions Cardiovascular:     Rate and Rhythm: Normal rate and regular rhythm.     Pulses: Normal pulses.  Pulmonary:     Effort: Pulmonary effort is normal.     Breath sounds: Normal breath sounds.     Comments: Ventilated breath sounds anteriorly Abdominal:     General: There is no distension.     Palpations: Abdomen is soft.  Musculoskeletal:        General: Normal range of motion.  Skin:    General: Skin is warm.  Neurological:     General: No focal deficit present.     Mental Status: He is alert and oriented to person, place, and time. Mental status is at baseline.     Assessment & Plan:   Neurology - alert and awake. No active issues. Hold psychotropic medications  Cardiovascular #HFrEF #CAD #Afib  Blood pressure is currently within normal. Patient has a history of HFrEF and is maintained on GDMT with metoprolol succinate 100 mg daily, Empagliflozin 10 mg daily, Entresto 24-26 mg twice daily, and spironolactone 25 mg daily. He is on lasix 40 mg daily for diuresis. He is also on Apixaban for stroke  prevention in the setting of his afib.  -continue home meds, hold jardiance -monitor blood pressure  Pulmonary #Acute Hypoxic Respiratory Failure  CT scan with findings of pneumonia in the RLL. Does have signs of scarring that is chronic, but the finding in the RLL is new compared to earlier in the month (renal CT with view into the RLL). Patient was saturating well on trach collar but reports feeling tired and wanted to rest on the ventilator. Starting him on PSV and he reported improvement. Will obtain respiratory cultures and treat for pneumonia.  -PSV now, trach collar as able -respiratory cultures  Gastrointestinal NPO for  now, will resume diet once patient off ventilator.  Renal #CKD  Patient with a history of CKD, baseline creatinine 1.5-1.6. Kidney function at baseline today.  Endocrine #Type 2 Diabetes  On Jardiance at home, will hold and substitute for glycemic protocol.  Hem/Onc Continue Eliquis for stroke prevention  ID  #RLL pneumonia  Chronically vented, multiple encounters with the health care system. New findings of RLL pneumonia. Will obtain respiratory cultures and continue broad spectrum antibiotics (started on Meropenem and Vancomycin in the ED).  -MRSA screen -Respiratory Cultures -Continue broad spectrum antibiotics   Best Practice (right click and "Reselect all SmartList Selections" daily)   Diet/type: NPO DVT prophylaxis: DOAC GI prophylaxis: N/A Lines: N/A Foley:  N/A Code Status:  full code Last date of multidisciplinary goals of care discussion [01/29/2022]  Labs   CBC: Recent Labs  Lab 01/28/22 2339  WBC 8.5  HGB 12.4*  HCT 39.4  MCV 88.9  PLT 341    Basic Metabolic Panel: Recent Labs  Lab 01/28/22 2339  NA 141  K 4.0  CL 108  CO2 27  GLUCOSE 129*  BUN 16  CREATININE 1.54*  CALCIUM 8.4*   GFR: Estimated Creatinine Clearance: 76.2 mL/min (A) (by C-G formula based on SCr of 1.54 mg/dL (H)). Recent Labs  Lab 01/28/22 2339  WBC 8.5    Liver Function Tests: Recent Labs  Lab 01/28/22 2345  AST 23  ALT 20  ALKPHOS 66  BILITOT 0.8  PROT 7.3  ALBUMIN 3.6   Recent Labs  Lab 01/28/22 2345  LIPASE 73*   No results for input(s): "AMMONIA" in the last 168 hours.  ABG    Component Value Date/Time   PHART 7.37 01/10/2022 0629   PCO2ART 54 (H) 01/10/2022 0629   PO2ART 73 (L) 01/10/2022 0629   HCO3 25.8 01/29/2022 0430   ACIDBASEDEF 1.8 01/29/2022 0430   O2SAT 86.2 01/29/2022 0430     Coagulation Profile: No results for input(s): "INR", "PROTIME" in the last 168 hours.  Cardiac Enzymes: No results for input(s): "CKTOTAL", "CKMB",  "CKMBINDEX", "TROPONINI" in the last 168 hours.  HbA1C: Hemoglobin A1C  Date/Time Value Ref Range Status  02/24/2013 04:54 AM 7.8 (H) 4.2 - 6.3 % Final    Comment:    The American Diabetes Association recommends that a primary goal of therapy should be <7% and that physicians should reevaluate the treatment regimen in patients with HbA1c values consistently >8%.    Hgb A1c MFr Bld  Date/Time Value Ref Range Status  01/28/2020 01:10 AM 6.6 (H) 4.8 - 5.6 % Final    Comment:    (NOTE) Pre diabetes:          5.7%-6.4%  Diabetes:              >6.4%  Glycemic control for   <7.0% adults with diabetes   01/02/2020 04:21  AM 6.5 (H) 4.8 - 5.6 % Final    Comment:    (NOTE) Pre diabetes:          5.7%-6.4%  Diabetes:              >6.4%  Glycemic control for   <7.0% adults with diabetes     CBG: No results for input(s): "GLUCAP" in the last 168 hours.  Review of Systems:   Unable to obtain  Past Medical History:  He,  has a past medical history of Acute on chronic respiratory failure with hypoxia (HCC), CHF (congestive heart failure) ([place]), CKD (chronic kidney disease) stage 3, GFR 30-59 ml/min (HCC), Clear cell renal cell carcinoma (HCC), COPD (chronic obstructive pulmonary disease) ([place]), COPD, severe ([place]), Coronary artery disease, Diabetes mellitus without complication ([place]), Hypercholesteremia ([place]), Hypertension, Myocardial infarction ([place]), Obstructive sleep apnea, Paroxysmal A-fib ([place]), Sarcoidosis, Sarcoidosis, lung ([place]), Sleep apnea, Stroke ([place]), and Tracheostomy status ([place]).   Surgical History:   Past Surgical History:  Procedure Laterality Date   APPENDECTOMY     COLONOSCOPY WITH PROPOFOL N/A 02/02/2021   Procedure: COLONOSCOPY WITH PROPOFOL;  Surgeon: Jonathon Bellows, MD;  Location: [facility] ENDOSCOPY;  Service: Gastroenterology;  Laterality: N/A;   COLONOSCOPY WITH PROPOFOL N/A 09/01/2021   Procedure: COLONOSCOPY WITH PROPOFOL;  Surgeon: Jonathon Bellows, MD;  Location:  [facility] ENDOSCOPY;  Service: Gastroenterology;  Laterality: N/A;   KNEE SURGERY Right    PARTIAL NEPHRECTOMY     TRACHEOSTOMY TUBE PLACEMENT N/A 01/14/2020   Procedure: TRACHEOSTOMY;  Surgeon: Clyde Canterbury, MD;  Location: [facility] ORS;  Service: ENT;  Laterality: N/A;     Social History:   reports that he quit smoking about 2 years ago. His smoking use included cigarettes. He has never used smokeless tobacco. He reports current alcohol use. He reports current drug use. Drug: Marijuana.   Family History:  His family history includes Cancer in his father; Diabetes in his mother; Heart disease in his mother; Hypertension in his mother.   Allergies Allergies  Allergen Reactions   Penicillins Anaphylaxis and Other (See Comments)    Has patient had a PCN reaction causing immediate rash, facial/tongue/throat swelling, SOB or lightheadedness with hypotension: Yes Has patient had a PCN reaction causing severe rash involving mucus membranes or skin necrosis: No Has patient had a PCN reaction that required hospitalization: No Has patient had a PCN reaction occurring within the last 10 years: No If all of the above answers are "NO", then may proceed with Cephalosporin use.      Home Medications  Prior to Admission medications   Medication Sig Start Date End Date Taking? Authorizing Provider  aspirin EC 81 MG tablet Take 81 mg by mouth daily. Swallow whole.    [provider]  atorvastatin (LIPITOR) 40 MG tablet TAKE 1 TABLET BY MOUTH AT BEDTIME 01/17/22   Hackney, Tina A, FNP  ELIQUIS 5 MG TABS tablet Take 5 mg by mouth 2 (two) times daily. 07/11/20   [provider]  empagliflozin (JARDIANCE) 10 MG TABS tablet Take 1 tablet (10 mg total) by mouth daily before breakfast. 01/05/22   Alisa Graff, FNP  fluticasone-salmeterol (ADVAIR) 250-50 MCG/ACT AEPB Inhale 1 puff into the lungs every 12 (twelve) hours. 05/23/20   [provider]  furosemide (LASIX) 40 MG tablet Take 1  tablet (40 mg total) by mouth daily. Patient taking differently: Take 40 mg by mouth as needed. Takes as needed 09/15/20   Lorella Nimrod, MD  ipratropium-albuterol (DUONEB) 0.5-2.5 (3) MG/3ML  SOLN Take 3 mLs by nebulization every 4 (four) hours. 07/12/20   [provider]  melatonin 5 MG TABS Take 5 mg by mouth at bedtime.    [provider]  Menthol-Methyl Salicylate (MUSCLE RUB) 10-15 % CREA Apply 1 Application topically as needed for muscle pain. 01/21/22   Lucillie Garfinkel, MD  metoprolol succinate (TOPROL-XL) 100 MG 24 hr tablet Take 1 tablet (100 mg total) by mouth daily. Take with or immediately following a meal. 08/02/21   Hackney, Aura Fey, FNP  NOVOLOG FLEXPEN 100 UNIT/ML FlexPen Inject 0-16 Units into the skin in the morning, at noon, in the evening, and at bedtime. 02/19/20   [provider]  oxyCODONE (ROXICODONE) 5 MG immediate release tablet Take 1 tablet (5 mg total) by mouth every 8 (eight) hours as needed for up to 4 doses. 01/21/22   Lucillie Garfinkel, MD  Potassium Chloride ER 20 MEQ TBCR Take 20 mEq by mouth as needed (if takes furosemide). 04/05/20   [provider]  sacubitril-valsartan (ENTRESTO) 24-26 MG Take 1 tablet by mouth 2 (two) times daily. 08/18/21   Alisa Graff, FNP  sildenafil (VIAGRA) 25 MG tablet Take 25 mg by mouth daily as needed for erectile dysfunction. Patient not taking: Reported on 01/16/2022    [provider]  Sod Picosulfate-Mag Ox-Cit Acd (CLENPIQ) 10-3.5-12 MG-GM -GM/160ML SOLN Take 1 bottle at 5 PM followed by five 8 oz cups of water and repeat 5 hours before procedure. Patient not taking: Reported on 09/01/2021 08/28/21   Jonathon Bellows, MD  spironolactone (ALDACTONE) 25 MG tablet Take 1 tablet (25 mg total) by mouth daily. 07/23/21   Alisa Graff, FNP  theophylline (THEODUR) 300 MG 12 hr tablet Take 300 mg by mouth 2 (two) times daily. 04/08/20   [provider]  tiotropium (SPIRIVA) 18 MCG inhalation capsule Place 18  mcg into inhaler and inhale daily.    [provider]  VENTOLIN HFA 108 (90 Base) MCG/ACT inhaler Inhale 2 puffs into the lungs every 6 (six) hours as needed for wheezing or shortness of breath. 05/25/20   [provider]     Critical care time: 38 minutes    Armando Reichert, MD [facility] 01/29/2022 3:41 PM

## 2022-01-29 NOTE — Consult Note (Addendum)
Pharmacy Antibiotic Note  [name] is a 51 y.o. male admitted on 01/29/2022 with pneumonia.  Pharmacy has been consulted for cefepime dosing. Listed allergy to PCN (anaphylaxis).   Plan: Start cefepime 2 g q8H. Pt has received ceftriaxone in the past.   Height: '6\' 1"'$  (185.4 cm) Weight: 117.4 kg (258 lb 13.1 oz) IBW/kg (Calculated) : 79.9  Temp (24hrs), Avg:98.5 F (36.9 C), Min:98.1 F (36.7 C), Max:98.9 F (37.2 C)  Recent Labs  Lab 01/28/22 2339  WBC 8.5  CREATININE 1.54*    Estimated Creatinine Clearance: 76.2 mL/min (A) (by C-G formula based on SCr of 1.54 mg/dL (H)).    Allergies  Allergen Reactions   Penicillins Anaphylaxis and Other (See Comments)    Has patient had a PCN reaction causing immediate rash, facial/tongue/throat swelling, SOB or lightheadedness with hypotension: Yes Has patient had a PCN reaction causing severe rash involving mucus membranes or skin necrosis: No Has patient had a PCN reaction that required hospitalization: No Has patient had a PCN reaction occurring within the last 10 years: No If all of the above answers are "NO", then may proceed with Cephalosporin use.     Antimicrobials this admission: 12/25 meropenem >> 12/25 12/25 vancomycin >> 12/25  Dose adjustments this admission: None  Microbiology results: 12/25 BCx: pending 12/25 RespCx: pending  12/25 MRSA PCR: negative.  Thank you for allowing pharmacy to be a part of this patient's care.  Oswald Hillock, PharmD, BCPS 01/29/2022 6:13 PM

## 2022-01-29 NOTE — ED Notes (Signed)
Pt called out asking for a breathing treatment. Pt with tachypnea and mildly labored breathing at this time. MD paged regarding pt request.

## 2022-01-29 NOTE — ED Notes (Signed)
Pt wife to desk requesting breathing treatment for pt. MD made aware at this time

## 2022-01-30 DIAGNOSIS — J9611 Chronic respiratory failure with hypoxia: Secondary | ICD-10-CM

## 2022-01-30 LAB — BASIC METABOLIC PANEL
Anion gap: 8 (ref 5–15)
BUN: 21 mg/dL — ABNORMAL HIGH (ref 6–20)
CO2: 26 mmol/L (ref 22–32)
Calcium: 8.7 mg/dL — ABNORMAL LOW (ref 8.9–10.3)
Chloride: 109 mmol/L (ref 98–111)
Creatinine, Ser: 1.31 mg/dL — ABNORMAL HIGH (ref 0.61–1.24)
GFR, Estimated: >60 mL/min (ref >60)
Glucose, Bld: 101 mg/dL — ABNORMAL HIGH (ref 70–99)
Potassium: 5 mmol/L (ref 3.5–5.1)
Sodium: 143 mmol/L (ref 135–145)

## 2022-01-30 LAB — GLUCOSE, CAPILLARY
Glucose-Capillary: 112 mg/dL — ABNORMAL HIGH (ref 70–99)
Glucose-Capillary: 95 mg/dL (ref 70–99)
Glucose-Capillary: 92 mg/dL (ref 70–99)
Glucose-Capillary: 137 mg/dL — ABNORMAL HIGH (ref 70–99)
Glucose-Capillary: 127 mg/dL — ABNORMAL HIGH (ref 70–99)
Glucose-Capillary: 124 mg/dL — ABNORMAL HIGH (ref 70–99)
Glucose-Capillary: 125 mg/dL — ABNORMAL HIGH (ref 70–99)

## 2022-01-30 LAB — MAGNESIUM: Magnesium: 2.3 mg/dL (ref 1.7–2.4)

## 2022-01-30 LAB — PHOSPHORUS: Phosphorus: 3.5 mg/dL (ref 2.5–4.6)

## 2022-01-30 LAB — CBC
HCT: 41.2 % (ref 39.0–52.0)
Hemoglobin: 13 g/dL (ref 13.0–17.0)
MCH: 28 pg (ref 26.0–34.0)
MCHC: 31.6 g/dL (ref 30.0–36.0)
MCV: 88.6 fL (ref 80.0–100.0)
Platelets: 228 10*3/uL (ref 150–400)
RBC: 4.65 MIL/uL (ref 4.22–5.81)
RDW: 13 % (ref 11.5–15.5)
WBC: 13.6 10*3/uL — ABNORMAL HIGH (ref 4.0–10.5)
nRBC: 0 % (ref 0.0–0.2)

## 2022-01-30 LAB — PROCALCITONIN: Procalcitonin: <0.1 ng/mL

## 2022-01-30 MED ORDER — APIXABAN 5 MG PO TABS
5.0000 mg | ORAL_TABLET | Freq: Two times a day (BID) | ORAL | Status: DC
  Administered 2022-01-30 – 2022-02-02 (×7): 5 mg via ORAL
  Filled 2022-01-30 (×7): qty 1

## 2022-01-30 MED ORDER — INSULIN ASPART 100 UNIT/ML IJ SOLN
0.0000 [IU] | Freq: Three times a day (TID) | INTRAMUSCULAR | Status: DC
  Administered 2022-01-31: 2 [IU] via SUBCUTANEOUS
  Administered 2022-01-31: 3 [IU] via SUBCUTANEOUS
  Administered 2022-02-01: 2 [IU] via SUBCUTANEOUS
  Administered 2022-02-01: 5 [IU] via SUBCUTANEOUS
  Administered 2022-02-01 – 2022-02-02 (×2): 2 [IU] via SUBCUTANEOUS
  Filled 2022-01-30 (×6): qty 1

## 2022-01-30 MED ORDER — ACETAMINOPHEN 500 MG PO TABS
1000.0000 mg | ORAL_TABLET | Freq: Three times a day (TID) | ORAL | Status: DC | PRN
  Administered 2022-01-30: 1000 mg via ORAL
  Filled 2022-01-30: qty 2

## 2022-01-30 MED ORDER — ENOXAPARIN SODIUM 60 MG/0.6ML IJ SOSY: 0.5000 mg/kg | PREFILLED_SYRINGE | INTRAMUSCULAR | Status: DC

## 2022-01-30 MED ORDER — IPRATROPIUM-ALBUTEROL 0.5-2.5 (3) MG/3ML IN SOLN
3.0000 mL | Freq: Two times a day (BID) | RESPIRATORY_TRACT | Status: DC
  Administered 2022-01-30 – 2022-02-02 (×6): 3 mL via RESPIRATORY_TRACT
  Filled 2022-01-30 (×6): qty 3

## 2022-01-30 MED ORDER — ACETYLCYSTEINE 20 % IN SOLN
3.0000 mL | Freq: Two times a day (BID) | RESPIRATORY_TRACT | Status: DC
  Administered 2022-01-30 – 2022-02-02 (×6): 3 mL via RESPIRATORY_TRACT
  Filled 2022-01-30 (×6): qty 4

## 2022-01-30 NOTE — Progress Notes (Signed)
  PROGRESS NOTE    LISTER BRIZZI  SJG:[id number] DOB: [birth year] DOA: 01/29/2022 PCP: Joanna  IC20A/IC20A-AA  LOS: 1 day   Brief hospital course:   Assessment & Plan: 51 year old male with a history of chronic hypoxic respiratory failure s/p tracheostomy (chronically trached) who presented to the hospital with increased shortness of breath.    # RLL pneumonia Chronically vented, multiple encounters with the health care system.  --CT scan with findings of pneumonia in the RLL. Does have signs of scarring that is chronic, but the finding in the RLL is new compared to earlier in the month (renal CT with view into the RLL) --started on Meropenem and Vancomycin in the ED. -MRSA screen neg --cont cefepime   #HFrEF --EF 45-50% in Oct 2020 --on home metoprolol succinate 100 mg daily, Empagliflozin 10 mg daily, Entresto 24-26 mg twice daily, and spironolactone 25 mg daily, lasix 40 mg daily PRN. --cont Toprol, Entresto, Lasix and aldactone  #CAD --cont ASA and statin  #Afib --cont Toprol --cont Eliquis   #Chronic Hypoxic Respiratory Failure --with trach collar, and connects to home trilogy vent when sleeping --cont home trilogy vent (wife brought in home unit)   #CKD2   #Type 2 Diabetes --hold home Jardiance  --ACHS and SSI for now    DVT prophylaxis: [id number] Code Status: Full code  Family Communication:  Level of care: Stepdown Dispo:   The patient is from: home Anticipated d/c is to: home Anticipated d/c date is: 2-3 days Patient currently is not medically ready to d/c due to: on IV abx for PNA   Subjective and Interval History:  Pt admitted to sputum production.  No N/V/D.     Objective: Vitals:   01/30/22 1427 01/30/22 1516 01/30/22 1600 01/30/22 1700  BP: (!) 143/98 134/76 129/65 (!) 146/106  Pulse: 80 80 83 82  Resp: 18 (!) 32 20 (!) 25  Temp:   98.7 F (37.1 C)   TempSrc:   Oral   SpO2: 99% 96% 95% 95%  Weight:       Height:        Intake/Output Summary (Last 24 hours) at 01/30/2022 1822 Last data filed at 01/30/2022 1000 Gross per 24 hour  Intake 200.29 ml  Output 1600 ml  Net -1399.71 ml   Filed Weights   01/29/22 0725  Weight: 117.4 kg    Examination:   Constitutional: NAD, AAOx3 HEENT: conjunctivae and lids normal, EOMI CV: No cyanosis.   RESP: trach connected to vent, mild wheezing SKIN: warm, dry   Data Reviewed: I have personally reviewed labs and imaging studies  Time spent: 50 minutes  Enzo Bi, MD [facility] If 7PM-7AM, please contact night-coverage 01/30/2022, 6:22 PM

## 2022-01-30 NOTE — Consult Note (Signed)
PHARMACY CONSULT NOTE - ELECTROLYTES  Pharmacy Consult for Electrolyte Monitoring and Replacement   Recent Labs: Potassium (mmol/L)  Date Value  01/30/2022 5.0  05/28/2014 3.9   Magnesium (mg/dL)  Date Value  01/30/2022 2.3  05/14/2013 1.8   Calcium (mg/dL)  Date Value  01/30/2022 8.7 (L)   Calcium, Total (mg/dL)  Date Value  05/28/2014 8.4 (L)   Albumin (g/dL)  Date Value  01/28/2022 3.6  06/01/2013 3.8   Phosphorus (mg/dL)  Date Value  01/30/2022 3.5  03/09/2013 3.0   Sodium (mmol/L)  Date Value  01/30/2022 143  05/28/2014 140   Corrected Ca: 9.0 mg/dL  Assessment  [name] is a 51 y.o. male presenting with increased SOB. PMH significant for COPD, CKD, HFrEF, HTN, HLD, CAD, pulmonary sarcoidosis (in remission), Afib (on Eliquis). Pharmacy has been consulted to monitor and replace electrolytes.  Diet: Regular (2g Na restriction) Pertinent Medications: Furosemide 40 mg PO daily, Spironolactone 25 mg PO daily, Entresto 24/26 mg PO daily  Goal of Therapy: Electrolytes WNL, K >4.0, Mg >2.0  Plan:  Potassium: 4.0 >> 5.0, no replacement needed Magnesium: 2.3, no replacement needed Phosphorus: 3.5, no replacement needed Pharmacy will sign off and continue to monitor peripherally  Thank you for allowing pharmacy to be a part of this patient's care.  Gretel Acre, PharmD PGY1 Pharmacy Resident 01/30/2022 9:19 AM

## 2022-01-30 NOTE — Progress Notes (Signed)
Pt. Had his wife bring his home trilogy and prefers to use it here during his stay.. Dr. Billie Ruddy aware and agrees.

## 2022-01-31 DIAGNOSIS — I4819 Other persistent atrial fibrillation: Secondary | ICD-10-CM | POA: Diagnosis not present

## 2022-01-31 DIAGNOSIS — J189 Pneumonia, unspecified organism: Secondary | ICD-10-CM

## 2022-01-31 DIAGNOSIS — J9611 Chronic respiratory failure with hypoxia: Secondary | ICD-10-CM | POA: Diagnosis not present

## 2022-01-31 LAB — CBC
HCT: 40.1 % (ref 39.0–52.0)
Hemoglobin: 12.9 g/dL — ABNORMAL LOW (ref 13.0–17.0)
MCH: 27.9 pg (ref 26.0–34.0)
MCHC: 32.2 g/dL (ref 30.0–36.0)
MCV: 86.8 fL (ref 80.0–100.0)
Platelets: 232 10*3/uL (ref 150–400)
RBC: 4.62 MIL/uL (ref 4.22–5.81)
RDW: 12.8 % (ref 11.5–15.5)
WBC: 6.8 10*3/uL (ref 4.0–10.5)
nRBC: 0 % (ref 0.0–0.2)

## 2022-01-31 LAB — BASIC METABOLIC PANEL
Anion gap: 6 (ref 5–15)
BUN: 30 mg/dL — ABNORMAL HIGH (ref 6–20)
CO2: 28 mmol/L (ref 22–32)
Calcium: 8.4 mg/dL — ABNORMAL LOW (ref 8.9–10.3)
Chloride: 106 mmol/L (ref 98–111)
Creatinine, Ser: 1.58 mg/dL — ABNORMAL HIGH (ref 0.61–1.24)
GFR, Estimated: 53 mL/min — ABNORMAL LOW (ref >60)
Glucose, Bld: 88 mg/dL (ref 70–99)
Potassium: 4.2 mmol/L (ref 3.5–5.1)
Sodium: 140 mmol/L (ref 135–145)

## 2022-01-31 LAB — CULTURE, RESPIRATORY W GRAM STAIN: Culture: NORMAL

## 2022-01-31 LAB — GLUCOSE, CAPILLARY
Glucose-Capillary: 151 mg/dL — ABNORMAL HIGH (ref 70–99)
Glucose-Capillary: 154 mg/dL — ABNORMAL HIGH (ref 70–99)
Glucose-Capillary: 141 mg/dL — ABNORMAL HIGH (ref 70–99)
Glucose-Capillary: 111 mg/dL — ABNORMAL HIGH (ref 70–99)

## 2022-01-31 LAB — MAGNESIUM: Magnesium: 2.2 mg/dL (ref 1.7–2.4)

## 2022-01-31 MED ORDER — PREDNISONE 10 MG PO TABS
20.0000 mg | ORAL_TABLET | Freq: Every day | ORAL | Status: DC
  Administered 2022-02-01 – 2022-02-02 (×2): 20 mg via ORAL
  Filled 2022-01-31 (×2): qty 2

## 2022-01-31 NOTE — Progress Notes (Addendum)
Progress Note   Patient: [name] FOY:[id number] DOB: 1970-02-27 DOA: 01/29/2022     2 DOS: the patient was seen and examined on 01/31/2022   Brief hospital course: In summary, patient is a 51 year old male with history of chronic hypoxic respiratory failure s/p tracheostomy (chronically trached) who presented to the hospital with increased shortness of breath. CT scan concerning for increased RLL infiltratre suggesting PNA  Assessment and Plan:  #RLL pneumonia:  S/P Trach.On vent at night and nasal canula 2L/min during the day.Multiple admissions in the past. CT scan with findings of pneumonia in the RLL which was interpreted to be new. On cefepime day #3. Was on meropenem and Vanc on admisson.Blood cultures show NGTD. Resp cultures show: Moderate WBC, few gram-negative rods, few gram-positive rods and few gram-positive cocci in pairs.      #HFrEF Compensated.  Left ventricular EF 45-50% in Oct 2020 Continue on home metoprolol succinate 100 mg daily,Entresto 24-26 mg twice daily, and spironolactone 25 mg daily, lasix 40 mg daily PRN.    #CAD-asymptomatic. Continue with ASA and statin   #Afib Rate and rhythm controlled on Toprol.On Eliquis for stroke prophylaxis    #Chronic Hypoxic Respiratory Failure --with trach collar, and connects to home trilogy vent when sleeping --cont home trilogy vent (wife brought in home unit)   #CKD2- stable renal function.  Avoid nephrotoxic medication.  Jardiance on hold.   #Type 2 Diabetes Jardiance on hold.  Optimize blood glucose control with insulin sliding scale.  Fingerstick glucose q.ACHS    #Obesity: BMI  of 34.15.  Lifestyle modification reemphasized  #Acute on chronic debility Patient will benefit from PT and OT eval prior to expected discharge.  Subjective: Patient feels clinically better.  Still has episodes of coughing spells.  He complains of feeling tightness in his chest.  Discussed possibility of trial of prednisone.  Will  monitor for improvement of his symptoms.  He is yet to work with physical therapy.  Physical Exam: Vitals:   01/31/22 0500 01/31/22 0545 01/31/22 0820 01/31/22 0900  BP: 116/78  117/83   Pulse: 95 (!) 105 93   Resp: (!) 22 (!) 23    Temp: 98.5 F (36.9 C)  98.3 F (36.8 C) 98.3 F (36.8 C)  TempSrc: Oral  Oral   SpO2: 98% 93% 93%   Weight: 117.4 kg     Height:       General: Patient is alert oriented x 3.  Sitting uncomfortably in chair at bedside.  Not in any significant distress.  Has intermittent coughing spells when speaking. HEENT: Head is atraumatic.  Trach collar in place.   Neck supple. Chest: Clinically diminished bilaterally.  Mild expiratory wheezes appreciated. Cardiovascular: S1-S2.  Rate,regular rhythm.  Low-grade systolic murmur.  Trace lower extremity edema CNS: No obvious focal deficits. Skin is negative for any new rash. Musculoskeletal: Muscle bulk within normal limits.   Data Reviewed:   Sodium 140, potassium 4.2, bicarb 28, BUN 30, creatinine 1.58, calcium 8.4.  WBC 6.8, hemoglobin 12.9 platelet count 232, glucose 88.  MRSA screen negative.  Blood cultures no growth to date.  Respiratory cultures as noted above. Family Communication: I was able to speak with wife via phone was in the room with patient.  She was to be updated regarding patient's progress.  Disposition: Status is: Inpatient Remains inpatient appropriate because: Pain improvement with respiratory function.  Planned Discharge Destination: Home DVT prophylaxis with apixaban    Time spent: 35 minutes  Author: Collier Salina  Lennie Muckle, MD 01/31/2022 11:00 AM  For on call review [email or web address].

## 2022-01-31 NOTE — Progress Notes (Signed)
PT Cancellation Note  Patient Details Name: [name] MRN: [id number] DOB: 03-Oct-1970   Cancelled Treatment:    Reason Eval/Treat Not Completed: Other (comment) PT orders received, chart reviewed. Nurse reports pt is currently napping on trilogy vent & requesting PT come back later. Will reattempt as able.  Lavone Nian, PT, DPT 01/31/22, 3:08 PM   [name] 01/31/2022, 3:08 PM

## 2022-02-01 DIAGNOSIS — J189 Pneumonia, unspecified organism: Secondary | ICD-10-CM | POA: Diagnosis not present

## 2022-02-01 DIAGNOSIS — J9611 Chronic respiratory failure with hypoxia: Secondary | ICD-10-CM | POA: Diagnosis not present

## 2022-02-01 DIAGNOSIS — J9621 Acute and chronic respiratory failure with hypoxia: Secondary | ICD-10-CM | POA: Diagnosis not present

## 2022-02-01 LAB — CBC
HCT: 38.1 % — ABNORMAL LOW (ref 39.0–52.0)
Hemoglobin: 12.4 g/dL — ABNORMAL LOW (ref 13.0–17.0)
MCH: 27.9 pg (ref 26.0–34.0)
MCHC: 32.5 g/dL (ref 30.0–36.0)
MCV: 85.6 fL (ref 80.0–100.0)
Platelets: 248 10*3/uL (ref 150–400)
RBC: 4.45 MIL/uL (ref 4.22–5.81)
RDW: 12.7 % (ref 11.5–15.5)
WBC: 5.9 10*3/uL (ref 4.0–10.5)
nRBC: 0 % (ref 0.0–0.2)

## 2022-02-01 LAB — MAGNESIUM: Magnesium: 2.3 mg/dL (ref 1.7–2.4)

## 2022-02-01 LAB — BASIC METABOLIC PANEL
Anion gap: 6 (ref 5–15)
BUN: 33 mg/dL — ABNORMAL HIGH (ref 6–20)
CO2: 24 mmol/L (ref 22–32)
Calcium: 8.3 mg/dL — ABNORMAL LOW (ref 8.9–10.3)
Chloride: 109 mmol/L (ref 98–111)
Creatinine, Ser: 1.49 mg/dL — ABNORMAL HIGH (ref 0.61–1.24)
GFR, Estimated: 56 mL/min — ABNORMAL LOW (ref >60)
Glucose, Bld: 116 mg/dL — ABNORMAL HIGH (ref 70–99)
Potassium: 4.2 mmol/L (ref 3.5–5.1)
Sodium: 139 mmol/L (ref 135–145)

## 2022-02-01 LAB — GLUCOSE, CAPILLARY
Glucose-Capillary: 137 mg/dL — ABNORMAL HIGH (ref 70–99)
Glucose-Capillary: 201 mg/dL — ABNORMAL HIGH (ref 70–99)
Glucose-Capillary: 126 mg/dL — ABNORMAL HIGH (ref 70–99)
Glucose-Capillary: 167 mg/dL — ABNORMAL HIGH (ref 70–99)

## 2022-02-01 NOTE — Progress Notes (Signed)
[facility]                                                                               [name], is a 51 y.o. male, DOB - 1970/07/02, FUX:[id number] Admit date - 01/29/2022    Outpatient Primary MD for the patient is [place]  LOS - 3  days    Brief summary   In summary, patient is a 51 year old male with history of chronic hypoxic respiratory failure s/p tracheostomy (chronically trached) who presented to the hospital with increased shortness of breath. CT scan concerning for increased RLL infiltratre suggesting PNA    Assessment & Plan    Assessment and Plan:  Acute on chronic respiratory failure with hypoxia secondary to right lower lobe pneumonia in the setting of chronic trach on vent at night.  Patient is currently on 2 L of nasal cannula oxygen which is the same as at home CT of the chest shows pneumonia in the right lower lobe Continue the course of antibiotics by tomorrow and discharge home. Sputum cultures showed normal flora Blood cultures negative till date   Chronic systolic heart failure Last echo showed left ventricular ejection fraction of 45 to 50% He appears to be compensated at this time    Atrial fibrillation Rate control with Toprol and on Eliquis for anticoagulation.    Stage IIc Kd Creatinine at baseline continue to monitor    Type 2 diabetes mellitus Continue with sliding scale insulin     Obesity Outpatient follow-up with PCP lifestyle modification reemphasized.   Coronary artery disease Patient currently denies any chest pain  Continue with aspirin and statin.    Estimated body mass index is 32.98 kg/m as calculated from the following:   Height as of this encounter: '6\' 1"'$  (1.854 m).   Weight as of this encounter: 113.4 kg.  Code Status: full code.  DVT Prophylaxis:   apixaban (ELIQUIS) tablet 5 mg   Level of Care: Level of care: Med-Surg Family Communication: discussed with  wife over the phone.   Disposition Plan:     Remains inpatient appropriate:  discharge home tomorrow.   Procedures:  None.   Consultants:   None.   Antimicrobials:   Anti-infectives (From admission, onward)    Start     Dose/Rate Route Frequency Ordered Stop   01/29/22 1845  ceFEPIme (MAXIPIME) 2 g in sodium chloride 0.9 % 100 mL IVPB        2 g 200 mL/hr over 30 Minutes Intravenous Every 8 hours 01/29/22 1830     01/29/22 0630  meropenem (MERREM) 1 g in sodium chloride 0.9 % 100 mL IVPB        1 g 200 mL/hr over 30 Minutes Intravenous  Once 01/29/22 0615 01/29/22 0909   01/29/22 0630  vancomycin (VANCOREADY) IVPB 2000 mg/400 mL        2,000 mg 200 mL/hr over 120 Minutes Intravenous  Once 01/29/22 0615 01/29/22 1107        Medications  Scheduled Meds:  acetylcysteine  3 mL Nebulization BID   apixaban  5 mg Oral BID   aspirin EC  81 mg Oral  Daily   atorvastatin  40 mg Oral QHS   Chlorhexidine Gluconate Cloth  6 each Topical Daily   furosemide  40 mg Oral Daily   insulin aspart  0-15 Units Subcutaneous TID WC   ipratropium-albuterol  3 mL Nebulization BID   metoprolol succinate  100 mg Oral Daily   predniSONE  20 mg Oral Q breakfast   sacubitril-valsartan  1 tablet Oral BID   spironolactone  25 mg Oral Daily   Continuous Infusions:  ceFEPime (MAXIPIME) IV 2 g (02/01/22 1333)   PRN Meds:.acetaminophen, docusate sodium, ipratropium-albuterol, polyethylene glycol    Subjective:   [name] was seen and examined today.   Breathing has improved, some cough, but better.   Objective:   Vitals:   02/01/22 0522 02/01/22 0600 02/01/22 0757 02/01/22 0810  BP: 92/76   127/81  Pulse:    86  Resp: '20 20  20  '$ Temp: 98.2 F (36.8 C)     TempSrc: Oral     SpO2: 99%  93% 98%  Weight:      Height:        Intake/Output Summary (Last 24 hours) at 02/01/2022 1431 Last data filed at 02/01/2022 0600 Gross per 24 hour  Intake 300 ml  Output 650 ml  Net -350 ml    Filed Weights   01/29/22 0725 01/31/22 0500 02/01/22 0500  Weight: 117.4 kg 117.4 kg 113.4 kg     Exam General exam: Appears calm and comfortable  Respiratory system: Clear to auscultation. Respiratory effort normal. Cardiovascular system: S1 & S2 heard, RRR. Gastrointestinal system: Abdomen is nondistended, soft and nontender.  Central nervous system: Alert and oriented.  Extremities: Symmetric 5 x 5 power. Skin: No rashes, Psychiatry:mood appropriate.    Data Reviewed:  I have personally reviewed following labs and imaging studies   CBC Lab Results  Component Value Date   WBC 5.9 02/01/2022   RBC 4.45 02/01/2022   HGB 12.4 (L) 02/01/2022   HCT 38.1 (L) 02/01/2022   MCV 85.6 02/01/2022   MCH 27.9 02/01/2022   PLT 248 02/01/2022   MCHC 32.5 02/01/2022   RDW 12.7 02/01/2022   LYMPHSABS 1.2 01/10/2022   MONOABS 1.3 (H) 01/10/2022   EOSABS 0.0 01/10/2022   BASOSABS 0.0 96/29/5284     Last metabolic panel Lab Results  Component Value Date   NA 139 02/01/2022   K 4.2 02/01/2022   CL 109 02/01/2022   CO2 24 02/01/2022   BUN 33 (H) 02/01/2022   CREATININE 1.49 (H) 02/01/2022   GLUCOSE 116 (H) 02/01/2022   GFRNONAA 56 (L) 02/01/2022   GFRAA >60 08/01/2019   CALCIUM 8.3 (L) 02/01/2022   PHOS 3.5 01/30/2022   PROT 7.3 01/28/2022   ALBUMIN 3.6 01/28/2022   LABGLOB 2.9 01/03/2020   AGRATIO 1.1 01/03/2020   BILITOT 0.8 01/28/2022   ALKPHOS 66 01/28/2022   AST 23 01/28/2022   ALT 20 01/28/2022   ANIONGAP 6 02/01/2022    CBG (last 3)  Recent Labs    01/31/22 2136 02/01/22 0738 02/01/22 1121  GLUCAP 111* 137* 201*      Coagulation Profile: No results for input(s): "INR", "PROTIME" in the last 168 hours.   Radiology Studies: No results found.     Hosie Poisson M.D. [facility] 02/01/2022, 2:31 PM  Available via Epic secure chat 7am-7pm After 7 pm, please refer to night coverage provider listed on amion.

## 2022-02-01 NOTE — Evaluation (Signed)
Physical Therapy Evaluation Patient Details Name: BALEY LORIMER MRN: [id number] DOB: December 08, 1970 Today's Date: 02/01/2022  History of Present Illness  Pt is a 51 y.o. male presenting to hospital 12/24 with c/o chest pain and SOB; recent dx of PNA.  Pt admitted with RLL PNA, HFrEF, and chronic hypoxic respiratory failure.  Pt using vent at night and 2 L [place] during day.  PMH includes CHF, CKD, COPD, htn, HLD, coronary disease, tracheostomy, R knee sx.  Clinical Impression  Prior to hospital admission, pt was independent with functional mobility; h/o trach; chronic home O2 use; lives with his wife in 1 level home.  No c/o pain during session.  Currently pt is independent with transfers and ambulation 200 feet (no AD use).  Pt's O2 sats 90% or greater on 2 L O2 via nasal cannula with activity (94% or greater at rest on 2 L O2); respiratory rate 21 at rest but up to 31-36 with activity (pt with slower gait speed in order to control breathing).  Pt appearing steady and safe with functional mobility and able to verbalize and demonstrate appropriate modifications to mobility to manage breathing.  No acute PT needs identified; will sign off (discussed with pt who was in agreement; pt reports he has been getting up on his own in room; therapist encouraged pt to ambulate 3x's per day with staff assist to manage hospital DME).   Recommendations for follow up therapy are one component of a multi-disciplinary discharge planning process, led by the attending physician.  Recommendations may be updated based on patient status, additional functional criteria and insurance authorization.  Follow Up Recommendations No PT follow up      Assistance Recommended at Discharge PRN  Patient can return home with the following  Assistance with cooking/housework;Assist for transportation    Equipment Recommendations None recommended by PT  Recommendations for Other Services       Functional Status Assessment Patient has  not had a recent decline in their functional status     Precautions / Restrictions Restrictions Weight Bearing Restrictions: No      Mobility  Bed Mobility               General bed mobility comments: Deferred (pt sitting in recliner beginning/end of session; no difficulties anticipated)    Transfers Overall transfer level: Independent Equipment used: None               General transfer comment: steady safe transfers noted    Ambulation/Gait Ambulation/Gait assistance: Independent Gait Distance (Feet): 200 Feet Assistive device: None Gait Pattern/deviations: Step-through pattern       General Gait Details: slower gait speed (to manage breathing)  Stairs            Wheelchair Mobility    Modified Rankin (Stroke Patients Only)       Balance Overall balance assessment: No apparent balance deficits (not formally assessed)                                           Pertinent Vitals/Pain Pain Assessment Pain Assessment: No/denies pain    Home Living Family/patient expects to be discharged to:: Private residence Living Arrangements: Spouse/significant other Available Help at Discharge: Family Type of Home: House Home Access: Stairs to enter Entrance Stairs-Rails: None Entrance Stairs-Number of Steps: 2   Home Layout: One level   Additional Comments: Vent support machine  at night; 2 L O2 baseline during day (nasal cannula)    Prior Function Prior Level of Function : Independent/Modified Independent             Mobility Comments: Working       Journalist, newspaper        Extremity/Trunk Assessment   Upper Extremity Assessment Upper Extremity Assessment: Overall WFL for tasks assessed    Lower Extremity Assessment Lower Extremity Assessment: Overall WFL for tasks assessed    Cervical / Trunk Assessment Cervical / Trunk Assessment: Normal  Communication   Communication: Passy-Muir valve  Cognition  Arousal/Alertness: Awake/alert Behavior During Therapy: WFL for tasks assessed/performed Overall Cognitive Status: Within Functional Limits for tasks assessed                                          General Comments  Nursing cleared pt for participation in physical therapy.  Pt agreeable to PT session.    Exercises     Assessment/Plan    PT Assessment Patient does not need any further PT services  PT Problem List         PT Treatment Interventions      PT Goals (Current goals can be found in the Care Plan section)  Acute Rehab PT Goals Patient Stated Goal: to go home PT Goal Formulation: With patient Time For Goal Achievement: 02/15/22 Potential to Achieve Goals: Good    Frequency       Co-evaluation               AM-PAC PT "6 Clicks" Mobility  Outcome Measure Help needed turning from your back to your side while in a flat bed without using bedrails?: None Help needed moving from lying on your back to sitting on the side of a flat bed without using bedrails?: None Help needed moving to and from a bed to a chair (including a wheelchair)?: None Help needed standing up from a chair using your arms (e.g., wheelchair or bedside chair)?: None Help needed to walk in hospital room?: None Help needed climbing 3-5 steps with a railing? : None 6 Click Score: 24    End of Session Equipment Utilized During Treatment: Oxygen;Gait belt (2 L O2 via nasal cannula) Activity Tolerance: Patient tolerated treatment well Patient left: in chair;with call bell/phone within reach Nurse Communication: Mobility status;Precautions PT Visit Diagnosis: Muscle weakness (generalized) (M62.81)    Time: 2951-8841 PT Time Calculation (min) (ACUTE ONLY): 22 min   Charges:   PT Evaluation $PT Eval Low Complexity: 1 Low         Iraida Cragin, PT 02/01/22, 12:07 PM

## 2022-02-01 NOTE — Progress Notes (Signed)
Placed on 2l [place] after tx

## 2022-02-02 DIAGNOSIS — J189 Pneumonia, unspecified organism: Secondary | ICD-10-CM | POA: Diagnosis not present

## 2022-02-02 DIAGNOSIS — J9611 Chronic respiratory failure with hypoxia: Secondary | ICD-10-CM | POA: Diagnosis not present

## 2022-02-02 LAB — BASIC METABOLIC PANEL
Anion gap: 3 — ABNORMAL LOW (ref 5–15)
BUN: 35 mg/dL — ABNORMAL HIGH (ref 6–20)
CO2: 28 mmol/L (ref 22–32)
Calcium: 8.3 mg/dL — ABNORMAL LOW (ref 8.9–10.3)
Chloride: 110 mmol/L (ref 98–111)
Creatinine, Ser: 1.55 mg/dL — ABNORMAL HIGH (ref 0.61–1.24)
GFR, Estimated: 54 mL/min — ABNORMAL LOW (ref >60)
Glucose, Bld: 122 mg/dL — ABNORMAL HIGH (ref 70–99)
Potassium: 4.2 mmol/L (ref 3.5–5.1)
Sodium: 141 mmol/L (ref 135–145)

## 2022-02-02 LAB — GLUCOSE, CAPILLARY
Glucose-Capillary: 127 mg/dL — ABNORMAL HIGH (ref 70–99)
Glucose-Capillary: 115 mg/dL — ABNORMAL HIGH (ref 70–99)

## 2022-02-02 LAB — CBC
HCT: 36.6 % — ABNORMAL LOW (ref 39.0–52.0)
Hemoglobin: 11.7 g/dL — ABNORMAL LOW (ref 13.0–17.0)
MCH: 27.7 pg (ref 26.0–34.0)
MCHC: 32 g/dL (ref 30.0–36.0)
MCV: 86.5 fL (ref 80.0–100.0)
Platelets: 238 10*3/uL (ref 150–400)
RBC: 4.23 MIL/uL (ref 4.22–5.81)
RDW: 12.6 % (ref 11.5–15.5)
WBC: 7.5 10*3/uL (ref 4.0–10.5)
nRBC: 0 % (ref 0.0–0.2)

## 2022-02-02 LAB — MAGNESIUM: Magnesium: 2.3 mg/dL (ref 1.7–2.4)

## 2022-02-02 MED ORDER — GUAIFENESIN-DM 100-10 MG/5ML PO SYRP: 10.0000 mL | ORAL_SOLUTION | ORAL | 0 refills | Status: AC | PRN

## 2022-02-02 MED ORDER — IPRATROPIUM-ALBUTEROL 0.5-2.5 (3) MG/3ML IN SOLN: 3.0000 mL | RESPIRATORY_TRACT | 3 refills | Status: AC | PRN

## 2022-02-02 MED ORDER — PREDNISONE 20 MG PO TABS: 20.0000 mg | ORAL_TABLET | Freq: Every day | ORAL | 0 refills | Status: AC

## 2022-02-02 MED ORDER — BENZONATATE 100 MG PO CAPS: 200.0000 mg | ORAL_CAPSULE | Freq: Three times a day (TID) | ORAL | 0 refills | Status: AC | PRN

## 2022-02-02 MED ORDER — LEVOFLOXACIN 750 MG PO TABS: 750.0000 mg | ORAL_TABLET | Freq: Every day | ORAL | 0 refills | Status: AC

## 2022-02-02 NOTE — Progress Notes (Signed)
Pt will discharge to home via private vehicle. Discharge instructions and medication regimen reviewed at bedside with patient. Pt. verbalizes understanding of instructions and medication regimen. Prescriptions sent to pts pharmacy. Patient assessment unchanged from this morning. TELE and IV discontinued per policy.

## 2022-02-03 LAB — CULTURE, BLOOD (ROUTINE X 2)
Culture: NO GROWTH
Culture: NO GROWTH
Special Requests: ADEQUATE
Special Requests: ADEQUATE

## 2022-02-07 NOTE — Discharge Summary (Signed)
Physician Discharge Summary   Patient: [name] MRN: [id number] DOB: 1970-02-14  Admit date:     01/29/2022  Discharge date: 02/02/2022  Discharge Physician: Hosie Poisson   PCP: Santa Claus   Recommendations at discharge:  Please follow up with PCp in one week.  Please check cbc and BMP in one week.   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 CT scan concerning for increased RLL infiltratre suggesting PNA   Assessment and Plan:   Acute on chronic respiratory failure with hypoxia secondary to right lower lobe pneumonia in the setting of chronic trach on vent at night.  Patient is currently on 2 L of nasal cannula oxygen which is the same as at home CT of the chest shows pneumonia in the right lower lobe Transition to oral antibiotics as his breathing has improved.  Sputum cultures showed normal flora Blood cultures negative till date     Chronic systolic heart failure Last echo showed left ventricular ejection fraction of 45 to 50% He appears to be compensated at this time       Atrial fibrillation Rate control with Toprol and on Eliquis for anticoagulation.       Stage IIc Kd Creatinine at baseline continue to monitor       Type 2 diabetes mellitus Resume home meds on discharge.          Obesity Outpatient follow-up with PCP lifestyle modification reemphasized.     Coronary artery disease Patient currently denies any chest pain  Continue with aspirin and statin.       Estimated body mass index is 32.98 kg/m as calculated from the following:   Height as of this encounter: '6\' 1"'$  (1.854 m).   Weight as of this encounter: 113.4 kg.       Consultants: none.  Procedures  performed: none.   Disposition: Home Diet recommendation:  Discharge Diet Orders (From admission, onward)     Start     Ordered   02/02/22 0000  Diet - low sodium heart healthy        02/02/22 1312           Regular diet DISCHARGE MEDICATION: Allergies as of 02/02/2022       Reactions   Penicillins Anaphylaxis, Other (See Comments)   Has patient had a PCN reaction causing immediate rash, facial/tongue/throat swelling, SOB or lightheadedness with hypotension: Yes Has patient had a PCN reaction causing severe rash involving mucus membranes or skin necrosis: No Has patient had a PCN reaction that required hospitalization: No Has patient had a PCN reaction occurring within the last 10 years: No If all of the above answers are "NO", then may proceed with Cephalosporin use.        Medication List     TAKE these medications    aspirin EC 81 MG tablet Take 81 mg by mouth daily. Swallow whole.   atorvastatin 40 MG tablet Commonly known as: LIPITOR TAKE 1 TABLET BY MOUTH AT BEDTIME   benzonatate 100 MG capsule Commonly known as: Tessalon Perles Take 2 capsules (200 mg total) by mouth 3 (three) times daily as needed for cough.   Eliquis 5 MG Tabs tablet Generic drug: apixaban Take 5 mg by mouth 2 (two) times daily.  empagliflozin 10 MG Tabs tablet Commonly known as: JARDIANCE Take 1 tablet (10 mg total) by mouth daily before breakfast.   fluticasone-salmeterol 250-50 MCG/ACT Aepb Commonly known as: ADVAIR Inhale 1 puff into the lungs every 12 (twelve) hours.   furosemide 40 MG tablet Commonly known as: LASIX Take 1 tablet (40 mg total) by mouth daily. What changed:  when to take this reasons to take this   guaiFENesin-dextromethorphan 100-10 MG/5ML syrup Commonly known as: ROBITUSSIN DM Take 10 mLs by mouth every 4 (four) hours as needed for cough.   ipratropium-albuterol 0.5-2.5 (3) MG/3ML Soln Commonly known as: DUONEB Take 3 mLs by nebulization every 4  (four) hours as needed. What changed:  when to take this reasons to take this   melatonin 5 MG Tabs Take 5 mg by mouth at bedtime.   metoprolol succinate 100 MG 24 hr tablet Commonly known as: TOPROL-XL Take 1 tablet (100 mg total) by mouth daily. Take with or immediately following a meal.   Muscle Rub 10-15 % Crea Apply 1 Application topically as needed for muscle pain.   nitroGLYCERIN 0.4 MG SL tablet Commonly known as: NITROSTAT Place 0.4 mg under the tongue every 5 (five) minutes as needed.   NovoLOG FlexPen 100 UNIT/ML FlexPen Generic drug: insulin aspart Inject 0-16 Units into the skin 3 (three) times daily with meals. Based on SSI   Potassium Chloride ER 20 MEQ Tbcr Take 20 mEq by mouth as needed (if takes furosemide).   sacubitril-valsartan 24-26 MG Commonly known as: ENTRESTO Take 1 tablet by mouth 2 (two) times daily.   sildenafil 25 MG tablet Commonly known as: VIAGRA Take 25 mg by mouth daily as needed for erectile dysfunction.   spironolactone 25 MG tablet Commonly known as: ALDACTONE Take 1 tablet (25 mg total) by mouth daily.   theophylline 300 MG 12 hr tablet Commonly known as: THEODUR Take 300 mg by mouth 2 (two) times daily.   tiotropium 18 MCG inhalation capsule Commonly known as: SPIRIVA Place 18 mcg into inhaler and inhale daily.   Ventolin HFA 108 (90 Base) MCG/ACT inhaler Generic drug: albuterol Inhale 2 puffs into the lungs every 6 (six) hours as needed for wheezing or shortness of breath.       ASK your doctor about these medications    levofloxacin 750 MG tablet Commonly known as: Levaquin Take 1 tablet (750 mg total) by mouth daily for 2 days. Ask about: Should I take this medication?   predniSONE 20 MG tablet Commonly known as: DELTASONE Take 1 tablet (20 mg total) by mouth daily with breakfast for 3 days. Ask about: Should I take this medication?        Discharge Exam: Filed Weights   01/31/22 0500 02/01/22 0500  02/02/22 0405  Weight: 117.4 kg 113.4 kg 114.6 kg   General exam: Appears calm and comfortable  Respiratory system: Clear to auscultation. Respiratory effort normal. Cardiovascular system: S1 & S2 heard, RRR. No JVD, murmurs, rubs, gallops or clicks. No pedal edema. Gastrointestinal system: Abdomen is nondistended, soft and nontender. No organomegaly or masses felt. Normal bowel sounds heard. Central nervous system: Alert and oriented. No focal neurological deficits. Extremities: Symmetric 5 x 5 power. Skin: No rashes, lesions or ulcers Psychiatry: Judgement and insight appear normal. Mood & affect appropriate.    Condition at discharge: fair  The results of significant diagnostics from this hospitalization (including imaging, microbiology, ancillary and laboratory) are listed below for reference.   Imaging Studies: CT Chest Wo Contrast  Result Date: 01/29/2022 CLINICAL DATA:  Left lower lobe pulmonary infiltrate, COPD, dyspnea, chest pain EXAM: CT CHEST WITHOUT CONTRAST TECHNIQUE: Multidetector CT imaging of the chest was performed following the standard protocol without IV contrast. RADIATION DOSE REDUCTION: This exam was performed according to the departmental dose-optimization program which includes automated exposure control, adjustment of the mA and/or kV according to patient size and/or use of iterative reconstruction technique. COMPARISON:  02/03/2018 FINDINGS: Cardiovascular: No significant coronary artery calcification. Global cardiac size within normal limits. No pericardial effusion. Central pulmonary arteries are enlarged in keeping with changes of pulmonary arterial hypertension, unchanged. The thoracic aorta is unremarkable on this noncontrast examination Mediastinum/Nodes: Tracheostomy in expected position. Visualized thyroid is unremarkable. No pathologic thoracic adenopathy. The esophagus is unremarkable. Lungs/Pleura: Scattered areas of a parenchymal scarring and architectural  distortion are again identified, asymmetrically more severe within the upper and mid lung zones in keeping with chronic parenchymal changes related to the patient's underlying sarcoidosis. There has developed, however, superimposed relative bronchial wall thickening and peribronchial inflammatory appearing ground-glass and nodular infiltrate, best appreciated within the left upper lobe and more focal consolidation within the medial segment of the right middle lobe in keeping with changes of acute infection in the appropriate clinical setting. Stable elevation of the left hemidiaphragm. No pneumothorax or pleural effusion. Upper Abdomen: Cholelithiasis without pericholecystic inflammatory change. No acute abnormality. Musculoskeletal: No acute bone abnormality. No lytic or blastic bone lesion. IMPRESSION: 1. Interval development of multifocal pulmonary infiltrates in keeping with changes of acute infection in the appropriate clinical setting. No central obstructing lesion. 2. Stable chronic parenchymal changes related to the patient's underlying sarcoidosis. 3. Morphologic changes in keeping with pulmonary arterial hypertension, unchanged. 4. Cholelithiasis. Electronically Signed   By: Fidela Salisbury M.D.   On: 01/29/2022 03:55   DG Chest 2 View  Result Date: 01/29/2022 CLINICAL DATA:  Shortness of breath. EXAM: CHEST - 2 VIEW COMPARISON:  Chest radiograph dated 01/10/2022. FINDINGS: Tracheostomy in similar position. Left lung base pleural effusion and associated atelectasis/infiltrate similar or slightly progressed since the prior radiograph. Right lung base scarring. Bilateral linear scarring noted. No pneumothorax. Stable cardiomediastinal silhouette. No acute osseous pathology. IMPRESSION: Left lung base pleural effusion and associated atelectasis/infiltrate similar or slightly progressed. Electronically Signed   By: Anner Crete M.D.   On: 01/29/2022 00:16   CT Renal Stone Study  Result Date:  01/21/2022 CLINICAL DATA:  52 year old male with history of abdominal and flank pain. Suspected kidney stone. EXAM: CT ABDOMEN AND PELVIS WITHOUT CONTRAST TECHNIQUE: Multidetector CT imaging of the abdomen and pelvis was performed following the standard protocol without IV contrast. RADIATION DOSE REDUCTION: This exam was performed according to the departmental dose-optimization program which includes automated exposure control, adjustment of the mA and/or kV according to patient size and/or use of iterative reconstruction technique. COMPARISON:  CT of the abdomen and pelvis 05/11/2013. FINDINGS: Lower chest: Scattered areas of scarring and mild bronchiectasis are noted in the lung bases bilaterally, similar to the prior examination, most severe in the right lower lobe. Hepatobiliary: No definite suspicious cystic or solid hepatic lesions are confidently identified on today's noncontrast CT examination. Large peripherally calcified gallstone noted within the gallbladder measuring up to 3.7 x 2.6 cm. Gallbladder is completely contracted around this stone. No pericholecystic fluid or soft tissue stranding to suggest an acute cholecystitis at this time. Pancreas: No definite pancreatic mass or peripancreatic fluid collections or inflammatory changes are noted on today's noncontrast CT examination. Spleen: Unremarkable. Adrenals/Urinary Tract: No  calcifications are identified within the collecting system of either kidney, along the course of either ureter, or within the lumen of the urinary bladder. No hydroureteronephrosis or perinephric stranding. 1.5 cm low-attenuation lesion in the upper pole of the right kidney, incompletely characterized on today's noncontrast CT examination, but statistically likely to represent a cyst. Adjacent to this (axial image 27 of series 2) there is an 8 mm mildly high attenuation structure, incompletely characterized, but likely a small proteinaceous/hemorrhagic cyst. Unenhanced  appearance of the left kidney, bilateral adrenal glands and urinary bladder is otherwise normal. Stomach/Bowel: Unenhanced appearance of the stomach is normal. No pathologic dilatation of small bowel or colon. The appendix is not confidently identified and may be surgically absent. Regardless, there are no inflammatory changes noted adjacent to the cecum to suggest the presence of an acute appendicitis at this time. Vascular/Lymphatic: Atherosclerosis in the abdominal aorta and pelvic vasculature. No lymphadenopathy noted in the abdomen or pelvis. Reproductive: Prostate gland and seminal vesicles are unremarkable in appearance. Other: No significant volume of ascites.  No pneumoperitoneum. Musculoskeletal: There are no aggressive appearing lytic or blastic lesions noted in the visualized portions of the skeleton. IMPRESSION: 1. No acute findings are noted in the abdomen or pelvis to account for the patient's symptoms. 2. Cholelithiasis without evidence of acute cholecystitis. 3. Small lesions in the upper pole of the right kidney, favored to represent small cysts, one of which is likely simple and the other which is likely proteinaceous or hemorrhagic, however, neither of these are characterized on today's noncontrast CT examination. If of clinical concern, further evaluation with nonemergent outpatient abdominal MRI with and without IV gadolinium could provide definitive characterization. 4. Aortic atherosclerosis. 5. Extensive scarring in the lung bases bilaterally, similar to the prior study. Electronically Signed   By: Vinnie Langton M.D.   On: 01/21/2022 06:38   DG Chest Portable 1 View  Result Date: 01/10/2022 CLINICAL DATA:  Shortness of breath. EXAM: PORTABLE CHEST 1 VIEW COMPARISON:  09/05/2020 FINDINGS: Extensive architectural distortion and scarring again noted in both lungs, similar to prior. There is increased left base collapse/consolidation with tiny bilateral pleural effusions evident.  Tracheostomy tube remains in place. Cardiopericardial silhouette is at upper limits of normal for size. The visualized bony structures of the thorax are unremarkable. IMPRESSION: 1. Extensive architectural distortion and scarring in both lungs. 2. Increased left base collapse/consolidation with tiny bilateral pleural effusions. Electronically Signed   By: Misty Stanley M.D.   On: 01/10/2022 06:44    Microbiology: Results for orders placed or performed during the hospital encounter of 01/29/22  Resp panel by RT-PCR (RSV, Flu A&B, Covid) Anterior Nasal Swab     Status: None   Collection Time: 01/28/22 11:40 PM   Specimen: Anterior Nasal Swab  Result Value Ref Range Status   SARS Coronavirus 2 by RT PCR NEGATIVE NEGATIVE Final    Comment: (NOTE) SARS-CoV-2 target nucleic acids are NOT DETECTED.  The SARS-CoV-2 RNA is generally detectable in upper respiratory specimens during the acute phase of infection. The lowest concentration of SARS-CoV-2 viral copies this assay can detect is 138 copies/mL. A negative result does not preclude SARS-Cov-2 infection and should not be used as the sole basis for treatment or other patient management decisions. A negative result may occur with  improper specimen collection/handling, submission of specimen other than nasopharyngeal swab, presence of viral mutation(s) within the areas targeted by this assay, and inadequate number of viral copies(<138 copies/mL). A negative result must be combined with  clinical observations, patient history, and epidemiological information. The expected result is Negative.  Fact Sheet for Patients:  EntrepreneurPulse.com.au  Fact Sheet for Healthcare Providers:  IncredibleEmployment.be  This test is no t yet approved or cleared by the [place] FDA and  has been authorized for detection and/or diagnosis of SARS-CoV-2 by FDA under an Emergency Use Authorization (EUA). This EUA will  remain  in effect (meaning this test can be used) for the duration of the COVID-19 declaration under Section 564(b)(1) of the Act, 21 U.S.C.section 360bbb-3(b)(1), unless the authorization is terminated  or revoked sooner.       Influenza A by PCR NEGATIVE NEGATIVE Final   Influenza B by PCR NEGATIVE NEGATIVE Final    Comment: (NOTE) The Xpert Xpress SARS-CoV-2/FLU/RSV plus assay is intended as an aid in the diagnosis of influenza from Nasopharyngeal swab specimens and should not be used as a sole basis for treatment. Nasal washings and aspirates are unacceptable for Xpert Xpress SARS-CoV-2/FLU/RSV testing.  Fact Sheet for Patients: EntrepreneurPulse.com.au  Fact Sheet for Healthcare Providers: IncredibleEmployment.be  This test is not yet approved or cleared by the [place] FDA and has been authorized for detection and/or diagnosis of SARS-CoV-2 by FDA under an Emergency Use Authorization (EUA). This EUA will remain in effect (meaning this test can be used) for the duration of the COVID-19 declaration under Section 564(b)(1) of the Act, 21 U.S.C. section 360bbb-3(b)(1), unless the authorization is terminated or revoked.     Resp Syncytial Virus by PCR NEGATIVE NEGATIVE Final    Comment: (NOTE) Fact Sheet for Patients: EntrepreneurPulse.com.au  Fact Sheet for Healthcare Providers: IncredibleEmployment.be  This test is not yet approved or cleared by the [place] FDA and has been authorized for detection and/or diagnosis of SARS-CoV-2 by FDA under an Emergency Use Authorization (EUA). This EUA will remain in effect (meaning this test can be used) for the duration of the COVID-19 declaration under Section 564(b)(1) of the Act, 21 U.S.C. section 360bbb-3(b)(1), unless the authorization is terminated or revoked.  Performed at [facility], [address] [place], [place] [address]    Blood culture (routine x 2)     Status: None   Collection Time: 01/29/22  7:42 AM   Specimen: BLOOD LEFT HAND  Result Value Ref Range Status   Specimen Description BLOOD LEFT HAND  Final   Special Requests   Final    BOTTLES DRAWN AEROBIC AND ANAEROBIC Blood Culture adequate volume   Culture   Final    NO GROWTH 5 DAYS Performed at [facility], [address], [place], [place] [address]    Report Status 02/03/2022 FINAL  Final  Blood culture (routine x 2)     Status: None   Collection Time: 01/29/22  7:42 AM   Specimen: Right Antecubital; Blood  Result Value Ref Range Status   Specimen Description RIGHT ANTECUBITAL  Final   Special Requests   Final    BOTTLES DRAWN AEROBIC AND ANAEROBIC Blood Culture adequate volume   Culture   Final    NO GROWTH 5 DAYS Performed at [facility], [address], [place], [place] [address]    Report Status 02/03/2022 FINAL  Final  Culture, Respiratory w Gram Stain     Status: None   Collection Time: 01/29/22  7:42 AM   Specimen: Tracheal Aspirate; Respiratory  Result Value Ref Range Status   Specimen Description   Final    TRACHEAL ASPIRATE Performed at [facility], [place]  [address] [place], [place] [address]    Special Requests   Final    NONE Performed at [facility], [address] [place], [place] [address]    Gram Stain   Final    MODERATE WBC PRESENT, PREDOMINANTLY MONONUCLEAR FEW GRAM NEGATIVE RODS FEW GRAM POSITIVE RODS FEW GRAM POSITIVE COCCI IN PAIRS    Culture   Final    FEW Normal respiratory flora-no Staph aureus or Pseudomonas seen Performed at [facility], [address], [place], [place] [address]    Report Status 01/31/2022 FINAL  Final  MRSA Next Gen by PCR, Nasal     Status: None   Collection Time: 01/29/22  4:00 PM   Specimen: Nasal Mucosa; Nasal Swab  Result Value Ref Range Status   MRSA by PCR Next Gen NOT DETECTED NOT DETECTED Final    Comment: (NOTE) The  GeneXpert MRSA Assay (FDA approved for NASAL specimens only), is one component of a comprehensive MRSA colonization surveillance program. It is not intended to diagnose MRSA infection nor to guide or monitor treatment for MRSA infections. Test performance is not FDA approved in patients less than 57 years old. Performed at [facility], [address] [place], [place] [address]     Labs: CBC: Recent Labs  Lab 02/01/22 0625 02/02/22 0352  WBC 5.9 7.5  HGB 12.4* 11.7*  HCT 38.1* 36.6*  MCV 85.6 86.5  PLT 248 453   Basic Metabolic Panel: Recent Labs  Lab 02/01/22 0625 02/02/22 0352  NA 139 141  K 4.2 4.2  CL 109 110  CO2 24 28  GLUCOSE 116* 122*  BUN 33* 35*  CREATININE 1.49* 1.55*  CALCIUM 8.3* 8.3*  MG 2.3 2.3   Liver Function Tests: No results for input(s): "AST", "ALT", "ALKPHOS", "BILITOT", "PROT", "ALBUMIN" in the last 168 hours. CBG: Recent Labs  Lab 02/01/22 1121 02/01/22 1618 02/01/22 2148 02/02/22 0742 02/02/22 1131  GLUCAP 201* 126* 167* 127* 115*    Discharge time spent: 39 minutes.   Signed: Hosie Poisson, MD [facility]

## 2022-02-09 ENCOUNTER — Encounter: Payer: Self-pay | Admitting: Family

## 2022-02-09 ENCOUNTER — Ambulatory Visit
Admission: RE | Admit: 2022-02-09 | Discharge: 2022-02-09 | Disposition: A | Discharge status: Home / Self Care | Payer: Medicare HMO | Source: Ambulatory Visit | Attending: Family | Admitting: Family

## 2022-02-09 ENCOUNTER — Ambulatory Visit (HOSPITAL_BASED_OUTPATIENT_CLINIC_OR_DEPARTMENT_OTHER): Payer: Medicare HMO | Admitting: Family

## 2022-02-09 VITALS — BP 130/88 | HR 86 | Resp 18 | Wt 254.0 lb

## 2022-02-09 DIAGNOSIS — Z794 Long term (current) use of insulin: Secondary | ICD-10-CM | POA: Diagnosis not present

## 2022-02-09 DIAGNOSIS — E1122 Type 2 diabetes mellitus with diabetic chronic kidney disease: Secondary | ICD-10-CM | POA: Diagnosis not present

## 2022-02-09 DIAGNOSIS — G4733 Obstructive sleep apnea (adult) (pediatric): Secondary | ICD-10-CM | POA: Diagnosis not present

## 2022-02-09 DIAGNOSIS — I10 Essential (primary) hypertension: Secondary | ICD-10-CM | POA: Diagnosis not present

## 2022-02-09 DIAGNOSIS — Z7984 Long term (current) use of oral hypoglycemic drugs: Secondary | ICD-10-CM | POA: Diagnosis not present

## 2022-02-09 DIAGNOSIS — I340 Nonrheumatic mitral (valve) insufficiency: Secondary | ICD-10-CM | POA: Diagnosis not present

## 2022-02-09 DIAGNOSIS — N183 Chronic kidney disease, stage 3 unspecified: Secondary | ICD-10-CM | POA: Diagnosis not present

## 2022-02-09 DIAGNOSIS — E785 Hyperlipidemia, unspecified: Secondary | ICD-10-CM | POA: Diagnosis not present

## 2022-02-09 DIAGNOSIS — E119 Type 2 diabetes mellitus without complications: Secondary | ICD-10-CM

## 2022-02-09 DIAGNOSIS — I130 Hypertensive heart and chronic kidney disease with heart failure and stage 1 through stage 4 chronic kidney disease, or unspecified chronic kidney disease: Secondary | ICD-10-CM | POA: Insufficient documentation

## 2022-02-09 DIAGNOSIS — Z8673 Personal history of transient ischemic attack (TIA), and cerebral infarction without residual deficits: Secondary | ICD-10-CM | POA: Diagnosis not present

## 2022-02-09 DIAGNOSIS — J449 Chronic obstructive pulmonary disease, unspecified: Secondary | ICD-10-CM | POA: Diagnosis not present

## 2022-02-09 DIAGNOSIS — Z79899 Other long term (current) drug therapy: Secondary | ICD-10-CM | POA: Diagnosis not present

## 2022-02-09 DIAGNOSIS — I2510 Atherosclerotic heart disease of native coronary artery without angina pectoris: Secondary | ICD-10-CM | POA: Diagnosis not present

## 2022-02-09 DIAGNOSIS — D869 Sarcoidosis, unspecified: Secondary | ICD-10-CM | POA: Insufficient documentation

## 2022-02-09 DIAGNOSIS — I5042 Chronic combined systolic (congestive) and diastolic (congestive) heart failure: Secondary | ICD-10-CM | POA: Insufficient documentation

## 2022-02-09 NOTE — Patient Instructions (Signed)
Continue weighing daily and call for an overnight weight gain of 3 pounds or more or a weekly weight gain of more than 5 pounds.   If you have voicemail, please make sure your mailbox is cleaned out so that we may leave a message and please make sure to listen to any voicemails.     

## 2022-02-09 NOTE — Progress Notes (Signed)
*  PRELIMINARY RESULTS* Echocardiogram 2D Echocardiogram has been performed.  Sherrie Sport 02/09/2022, 9:32 AM

## 2022-02-09 NOTE — Progress Notes (Unsigned)
Patient ID: [name], male    DOB: July 20, 1970, 52 y.o.   MRN: [id number]  HPI  Mr Clementson is a 52 y/o male with a history of CVA, obstructive sleep apnea, HTN, sarcoidosis, hyperlipidemia, DM, CAD, COPD, remote tobacco use and chronic heart failure.  Echo report from 11/17/2018 reviewed and showed an EF of 45-50%. Echo done 02/15/15 and showed an EF of 60% with increased wall thickness. Mild MR along with mild TR. This is stable from a previous echo done on 08/15/14.  Admitted 01/29/22 due to SOB due to PNA. Discharged after 4 days. Was in the ED 01/21/22 due to flank pain. Was in the ED 01/10/22 due to productive cough, congestion, shortness of breath, loss of appetite and left-sided abdominal cramps. CXR showed possible pneumonia. Given antibiotics and short course of prednisone and released.    He presents today for a follow-up visit with a chief complaint of minimal fatigue upon moderate exertion. Has associated cough, shortness of breath and pedal edema along with this. Denies any dizziness, difficulty sleeping, abdominal distention, palpitations, chest pain, wheezing or weight gain.   Past Medical History:  Diagnosis Date   Acute on chronic respiratory failure with hypoxia (HCC)    CHF (congestive heart failure) (HCC)    EF 45-50%- 2020   CKD (chronic kidney disease) stage 3, GFR 30-59 ml/min (HCC)    Clear cell renal cell carcinoma (HCC)    s/p partial nephrectomy   COPD (chronic obstructive pulmonary disease) (HCC)    COPD, severe (HCC)    Coronary artery disease    Diabetes mellitus without complication (HCC)    Hypercholesteremia unk   Hypertension    Myocardial infarction ([place])    Obstructive sleep apnea    Paroxysmal A-fib (HCC)    Sarcoidosis    Sarcoidosis, lung (HCC)    Sleep apnea    does not use CPAP regularly.   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[facility])    Past Surgical History:  Procedure Laterality Date   APPENDECTOMY     COLONOSCOPY WITH PROPOFOL N/A  02/02/2021   Procedure: COLONOSCOPY WITH PROPOFOL;  Surgeon: Jonathon Bellows, MD;  Location: [facility] ENDOSCOPY;  Service: Gastroenterology;  Laterality: N/A;   COLONOSCOPY WITH PROPOFOL N/A 09/01/2021   Procedure: COLONOSCOPY WITH PROPOFOL;  Surgeon: Jonathon Bellows, MD;  Location: [facility] ENDOSCOPY;  Service: Gastroenterology;  Laterality: N/A;   KNEE SURGERY Right    PARTIAL NEPHRECTOMY     TRACHEOSTOMY TUBE PLACEMENT N/A 01/14/2020   Procedure: TRACHEOSTOMY;  Surgeon: Clyde Canterbury, MD;  Location: [facility] ORS;  Service: ENT;  Laterality: N/A;   Family History  Problem Relation Age of Onset   Hypertension Mother    Heart disease Mother    Diabetes Mother    Cancer Father    Social History   Tobacco Use   Smoking status: Former    Years: 25.00    Types: Cigarettes    Quit date: 12/30/2019    Years since quitting: 2.1   Smokeless tobacco: Never  Substance Use Topics   Alcohol use: Yes    Comment: occassional   Allergies  Allergen Reactions   Penicillins Anaphylaxis and Other (See Comments)    Has patient had a PCN reaction causing immediate rash, facial/tongue/throat swelling, SOB or lightheadedness with hypotension: Yes Has patient had a PCN reaction causing severe rash involving mucus membranes or skin necrosis: No Has patient had a PCN reaction that required hospitalization: No Has patient had a PCN reaction occurring  within the last 10 years: No If all of the above answers are "NO", then may proceed with Cephalosporin use.    Prior to Admission medications   Medication Sig Start Date End Date Taking? Authorizing Provider  aspirin EC 81 MG tablet Take 81 mg by mouth daily. Swallow whole.   Yes [provider]  atorvastatin (LIPITOR) 40 MG tablet TAKE 1 TABLET BY MOUTH AT BEDTIME 01/17/22  Yes Bracen Schum A, FNP  benzonatate (TESSALON PERLES) 100 MG capsule Take 2 capsules (200 mg total) by mouth 3 (three) times daily as needed for cough. 02/02/22 02/02/23 Yes Hosie Poisson, MD   ELIQUIS 5 MG TABS tablet Take 5 mg by mouth 2 (two) times daily. 07/11/20  Yes [provider]  empagliflozin (JARDIANCE) 10 MG TABS tablet Take 1 tablet (10 mg total) by mouth daily before breakfast. 01/05/22  Yes Karlea Mckibbin A, FNP  fluticasone-salmeterol (ADVAIR) 250-50 MCG/ACT AEPB Inhale 1 puff into the lungs every 12 (twelve) hours. 05/23/20  Yes [provider]  furosemide (LASIX) 40 MG tablet Take 1 tablet (40 mg total) by mouth daily. Patient taking differently: Take 40 mg by mouth daily as needed. 09/15/20  Yes Lorella Nimrod, MD  guaiFENesin-dextromethorphan (ROBITUSSIN DM) 100-10 MG/5ML syrup Take 10 mLs by mouth every 4 (four) hours as needed for cough. 02/02/22  Yes Hosie Poisson, MD  ipratropium-albuterol (DUONEB) 0.5-2.5 (3) MG/3ML SOLN Take 3 mLs by nebulization every 4 (four) hours as needed. 02/02/22  Yes Hosie Poisson, MD  melatonin 5 MG TABS Take 5 mg by mouth at bedtime.   Yes [provider]  Menthol-Methyl Salicylate (MUSCLE RUB) 10-15 % CREA Apply 1 Application topically as needed for muscle pain. 01/21/22  Yes Lucillie Garfinkel, MD  metoprolol succinate (TOPROL-XL) 100 MG 24 hr tablet Take 1 tablet (100 mg total) by mouth daily. Take with or immediately following a meal. 08/02/21  Yes Ameena Vesey A, FNP  NOVOLOG FLEXPEN 100 UNIT/ML FlexPen Inject 0-16 Units into the skin 3 (three) times daily with meals. Based on SSI 02/19/20  Yes [provider]  Potassium Chloride ER 20 MEQ TBCR Take 20 mEq by mouth as needed (if takes furosemide). 04/05/20  Yes [provider]  sacubitril-valsartan (ENTRESTO) 24-26 MG Take 1 tablet by mouth 2 (two) times daily. 08/18/21  Yes Darylene Price A, FNP  spironolactone (ALDACTONE) 25 MG tablet Take 1 tablet (25 mg total) by mouth daily. 07/23/21  Yes Jadalee Westcott A, FNP  theophylline (THEODUR) 300 MG 12 hr tablet Take 300 mg by mouth 2 (two) times daily. 04/08/20  Yes [provider]  tiotropium (SPIRIVA)  18 MCG inhalation capsule Place 18 mcg into inhaler and inhale daily.   Yes [provider]  VENTOLIN HFA 108 (90 Base) MCG/ACT inhaler Inhale 2 puffs into the lungs every 6 (six) hours as needed for wheezing or shortness of breath. 05/25/20  Yes [provider]  nitroGLYCERIN (NITROSTAT) 0.4 MG SL tablet Place 0.4 mg under the tongue every 5 (five) minutes as needed. Patient not taking: Reported on 02/09/2022    [provider]  sildenafil (VIAGRA) 25 MG tablet Take 25 mg by mouth daily as needed for erectile dysfunction. Patient not taking: Reported on 02/09/2022    [provider]    Review of Systems  Constitutional:  Positive for fatigue. Negative for appetite change.  HENT:  Negative for congestion, postnasal drip and sore throat.   Eyes: Negative.   Respiratory:  Positive for cough and  shortness of breath. Negative for chest tightness and wheezing.   Cardiovascular:  Positive for leg swelling ("very little"). Negative for chest pain and palpitations.  Gastrointestinal:  Negative for abdominal distention and abdominal pain.  Endocrine: Negative.   Genitourinary: Negative.   Musculoskeletal:  Negative for back pain.  Skin: Negative.   Allergic/Immunologic: Negative.   Neurological:  Negative for dizziness and light-headedness.  Hematological:  Negative for adenopathy. Does not bruise/bleed easily.  Psychiatric/Behavioral:  Negative for dysphoric mood and sleep disturbance (wearing NIV at night connected to trach). The patient is not nervous/anxious.    Vitals:   02/09/22 0948  BP: 130/88  Pulse: 86  Resp: 18  SpO2: 98%  Weight: 254 lb (115.2 kg)   Wt Readings from Last 3 Encounters:  02/09/22 254 lb (115.2 kg)  02/02/22 252 lb 10.4 oz (114.6 kg)  01/21/22 259 lb (117.5 kg)   Lab Results  Component Value Date   CREATININE 1.55 (H) 02/02/2022   CREATININE 1.49 (H) 02/01/2022   CREATININE 1.58 (H) 01/31/2022   Physical Exam Vitals and  nursing note reviewed.  Constitutional:      Appearance: He is well-developed.  HENT:     Head: Normocephalic and atraumatic.  Neck:     Vascular: No JVD.  Cardiovascular:     Rate and Rhythm: Normal rate and regular rhythm.  Pulmonary:     Effort: Pulmonary effort is normal.     Breath sounds: No wheezing or rales.  Abdominal:     General: There is no distension.     Palpations: Abdomen is soft.     Tenderness: There is no abdominal tenderness.  Musculoskeletal:        General: No tenderness.     Cervical back: Normal range of motion and neck supple.     Right lower leg: No tenderness. Edema (1+ pitting) present.     Left lower leg: No tenderness. Edema (1+ pitting) present.  Skin:    General: Skin is warm and dry.  Neurological:     Mental Status: He is alert and oriented to person, place, and time.  Psychiatric:        Behavior: Behavior normal.        Thought Content: Thought content normal.   Assessment & Plan:  1: Chronic heart failure with mildly reduced ejection fraction- - NYHA class II - euvolemic today - not weighing daily but does have working scales; Reminded to call for an overnight weight gain of >2 pounds or a weekly weight gain of >5 pounds - weight down 12 pounds from last visit here 3 weeks ago - drinking about 4-6 bottles of water daily.  - cooking with salt but not adding to food. Encouraged to not add salt at atll - saw cardiology ([place]) 12/05/21 - on GDMT of metoprolol, jardiance, spironolactone and entresto - had echo done earlier today - taking lasix/ potassium PRN and says that he last took these ~ 3-4 weeks ago - BNP 01/10/22 was 41.3  2: HTN- - BP 130/88 - follows with PCP @ [facility] and has to schedule an appt - BMP 02/02/22 reviewed and showed sodium 141, potassium 4.2, creatinine 1.55 and GFR 54  3: COPD-  - using nocturnal ventilator at night through his trach - saw pulmonologist [name]) 01/24/22  4:  Diabetes- - A1c was 6.6% on 01/28/20 - saw nephrology Smith Mince) 01/08/22 - home glucose today was 102   Patient did not bring his medications nor a list.  Each medication was verbally reviewed with the patient and he was encouraged to bring the bottles to every visit to confirm accuracy of list.  Return in 2 months, sooner if needed.

## 2022-02-10 ENCOUNTER — Encounter: Payer: Self-pay | Admitting: Family

## 2022-02-10 LAB — ECHOCARDIOGRAM COMPLETE
AR max vel: 2.69 cm2
AV Area VTI: 2.75 cm2
AV Area mean vel: 2.38 cm2
AV Mean grad: 4 mmHg
AV Peak grad: 6.3 mmHg
Ao pk vel: 1.25 m/s
Area-P 1/2: 3.34 cm2
S' Lateral: 3.6 cm

## 2022-04-11 ENCOUNTER — Encounter: Payer: Self-pay | Admitting: Family

## 2022-04-11 ENCOUNTER — Ambulatory Visit: Payer: Medicare HMO | Attending: Family | Admitting: Family

## 2022-04-11 VITALS — BP 144/91 | HR 90 | Wt 254.8 lb

## 2022-04-11 DIAGNOSIS — Z8249 Family history of ischemic heart disease and other diseases of the circulatory system: Secondary | ICD-10-CM | POA: Insufficient documentation

## 2022-04-11 DIAGNOSIS — Z79899 Other long term (current) drug therapy: Secondary | ICD-10-CM | POA: Insufficient documentation

## 2022-04-11 DIAGNOSIS — N183 Chronic kidney disease, stage 3 unspecified: Secondary | ICD-10-CM | POA: Insufficient documentation

## 2022-04-11 DIAGNOSIS — Z8673 Personal history of transient ischemic attack (TIA), and cerebral infarction without residual deficits: Secondary | ICD-10-CM | POA: Insufficient documentation

## 2022-04-11 DIAGNOSIS — Z794 Long term (current) use of insulin: Secondary | ICD-10-CM | POA: Diagnosis not present

## 2022-04-11 DIAGNOSIS — E119 Type 2 diabetes mellitus without complications: Secondary | ICD-10-CM | POA: Diagnosis not present

## 2022-04-11 DIAGNOSIS — I5032 Chronic diastolic (congestive) heart failure: Secondary | ICD-10-CM

## 2022-04-11 DIAGNOSIS — E1122 Type 2 diabetes mellitus with diabetic chronic kidney disease: Secondary | ICD-10-CM | POA: Diagnosis not present

## 2022-04-11 DIAGNOSIS — Z833 Family history of diabetes mellitus: Secondary | ICD-10-CM | POA: Diagnosis not present

## 2022-04-11 DIAGNOSIS — I10 Essential (primary) hypertension: Secondary | ICD-10-CM | POA: Diagnosis not present

## 2022-04-11 DIAGNOSIS — J449 Chronic obstructive pulmonary disease, unspecified: Secondary | ICD-10-CM

## 2022-04-11 DIAGNOSIS — I2510 Atherosclerotic heart disease of native coronary artery without angina pectoris: Secondary | ICD-10-CM | POA: Diagnosis not present

## 2022-04-11 DIAGNOSIS — G4733 Obstructive sleep apnea (adult) (pediatric): Secondary | ICD-10-CM | POA: Insufficient documentation

## 2022-04-11 DIAGNOSIS — D869 Sarcoidosis, unspecified: Secondary | ICD-10-CM | POA: Insufficient documentation

## 2022-04-11 DIAGNOSIS — Z87891 Personal history of nicotine dependence: Secondary | ICD-10-CM | POA: Diagnosis not present

## 2022-04-11 DIAGNOSIS — Z7984 Long term (current) use of oral hypoglycemic drugs: Secondary | ICD-10-CM | POA: Insufficient documentation

## 2022-04-11 DIAGNOSIS — I130 Hypertensive heart and chronic kidney disease with heart failure and stage 1 through stage 4 chronic kidney disease, or unspecified chronic kidney disease: Secondary | ICD-10-CM | POA: Insufficient documentation

## 2022-04-11 NOTE — Progress Notes (Signed)
Patient ID: [name], male    DOB: 1970/07/27, 52 y.o.   MRN: [id number]  HPI  Mr Kase is a 52 y/o male with a history of CVA, obstructive sleep apnea, HTN, sarcoidosis, hyperlipidemia, DM, CAD, COPD, remote tobacco use and chronic heart failure.  Echo 02/09/22 showed EF of 60-65% with moderate LVH/ LAE, moderate RAE and mild MR. Echo report from 11/17/2018 reviewed and showed an EF of 45-50%. Echo done 02/15/15 and showed an EF of 60% with increased wall thickness. Mild MR along with mild TR. This is stable from a previous echo done on 08/15/14.  Admitted 01/29/22 due to SOB due to PNA. Discharged after 4 days. Was in the ED 01/21/22 due to flank pain. Was in the ED 01/10/22 due to productive cough, congestion, shortness of breath, loss of appetite and left-sided abdominal cramps. CXR showed possible pneumonia. Given antibiotics and short course of prednisone and released.    He presents today for a HF follow-up visit with a chief complaint of minimal fatigue upon moderate exertion. Describes this as chronic in nature. Has associated cough, SOB and stable pedal edema along with this. Denies any difficulty sleeping, dizziness, abdominal distention, palpitations, chest pain or wheezing.   Hasn't been weighing daily but does have scales.   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 Says that he just got busy and remembered too late to take them.   Past Medical History:  Diagnosis Date   Acute on chronic respiratory failure with hypoxia (HCC)    CHF (congestive heart failure) (HCC)    EF 45-50%- 2020   CKD (chronic kidney disease) stage 3, GFR 30-59 ml/min (HCC)    Clear cell renal cell carcinoma (HCC)    s/p partial nephrectomy   COPD (chronic obstructive pulmonary disease) (HCC)    COPD, severe (HCC)    Coronary artery disease    Diabetes mellitus without complication (HCC)     Hypercholesteremia unk   Hypertension    Myocardial infarction ([place])    Obstructive sleep apnea    Paroxysmal A-fib (HCC)    Sarcoidosis    Sarcoidosis, lung (HCC)    Sleep apnea    does not use CPAP regularly.   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[facility])    Past Surgical History:  Procedure Laterality Date   APPENDECTOMY     COLONOSCOPY WITH PROPOFOL N/A 02/02/2021   Procedure: COLONOSCOPY WITH PROPOFOL;  Surgeon: Jonathon Bellows, MD;  Location: [facility] ENDOSCOPY;  Service: Gastroenterology;  Laterality: N/A;   COLONOSCOPY WITH PROPOFOL N/A 09/01/2021   Procedure: COLONOSCOPY WITH PROPOFOL;  Surgeon: Jonathon Bellows, MD;  Location: [facility] ENDOSCOPY;  Service: Gastroenterology;  Laterality: N/A;   KNEE SURGERY Right    PARTIAL NEPHRECTOMY     TRACHEOSTOMY TUBE PLACEMENT N/A 01/14/2020   Procedure: TRACHEOSTOMY;  Surgeon: Clyde Canterbury, MD;  Location: [facility] ORS;  Service: ENT;  Laterality: N/A;   Family History  Problem Relation Age of Onset   Hypertension Mother    Heart disease Mother    Diabetes Mother    Cancer Father    Social History   Tobacco Use   Smoking status: Former    Years: 25.00    Types: Cigarettes    Quit date: 12/30/2019    Years since quitting: 2.2   Smokeless tobacco: Never  Substance Use Topics   Alcohol use: Yes  Comment: occassional   Allergies  Allergen Reactions   Penicillins Anaphylaxis and Other (See Comments)    Has patient had a PCN reaction causing immediate rash, facial/tongue/throat swelling, SOB or lightheadedness with hypotension: Yes Has patient had a PCN reaction causing severe rash involving mucus membranes or skin necrosis: No Has patient had a PCN reaction that required hospitalization: No Has patient had a PCN reaction occurring within the last 10 years: No If all of the above answers are "NO", then may proceed with Cephalosporin use.    Prior to Admission medications   Medication Sig Start Date End Date Taking? Authorizing Provider   aspirin EC 81 MG tablet Take 81 mg by mouth daily. Swallow whole.   Yes [provider]  atorvastatin (LIPITOR) 40 MG tablet TAKE 1 TABLET BY MOUTH AT BEDTIME 01/17/22  Yes Millissa Deese A, FNP  benzonatate (TESSALON PERLES) 100 MG capsule Take 2 capsules (200 mg total) by mouth 3 (three) times daily as needed for cough. 02/02/22 02/02/23 Yes Hosie Poisson, MD  ELIQUIS 5 MG TABS tablet Take 5 mg by mouth 2 (two) times daily. 07/11/20  Yes [provider]  empagliflozin (JARDIANCE) 10 MG TABS tablet Take 1 tablet (10 mg total) by mouth daily before breakfast. 01/05/22  Yes Julie Nay A, FNP  fluticasone-salmeterol (ADVAIR) 250-50 MCG/ACT AEPB Inhale 1 puff into the lungs every 12 (twelve) hours. 05/23/20  Yes [provider]  furosemide (LASIX) 40 MG tablet Take 1 tablet (40 mg total) by mouth daily. Patient taking differently: Take 40 mg by mouth daily as needed. 09/15/20  Yes Lorella Nimrod, MD  guaiFENesin-dextromethorphan (ROBITUSSIN DM) 100-10 MG/5ML syrup Take 10 mLs by mouth every 4 (four) hours as needed for cough. 02/02/22  Yes Hosie Poisson, MD  ipratropium-albuterol (DUONEB) 0.5-2.5 (3) MG/3ML SOLN Take 3 mLs by nebulization every 4 (four) hours as needed. 02/02/22  Yes Hosie Poisson, MD  melatonin 5 MG TABS Take 5 mg by mouth at bedtime.   Yes [provider]  Menthol-Methyl Salicylate (MUSCLE RUB) 10-15 % CREA Apply 1 Application topically as needed for muscle pain. 01/21/22  Yes Lucillie Garfinkel, MD  metoprolol succinate (TOPROL-XL) 100 MG 24 hr tablet Take 1 tablet (100 mg total) by mouth daily. Take with or immediately following a meal. 08/02/21  Yes Laina Guerrieri, Otila Kluver A, FNP  nitroGLYCERIN (NITROSTAT) 0.4 MG SL tablet Place 0.4 mg under the tongue every 5 (five) minutes as needed.   Yes [provider]  NOVOLOG FLEXPEN 100 UNIT/ML FlexPen Inject 0-16 Units into the skin 3 (three) times daily with meals. Based on SSI 02/19/20  Yes [provider]   Potassium Chloride ER 20 MEQ TBCR Take 20 mEq by mouth as needed (if takes furosemide). 04/05/20  Yes [provider]  sacubitril-valsartan (ENTRESTO) 24-26 MG Take 1 tablet by mouth 2 (two) times daily. 08/18/21  Yes Darylene Price A, FNP  sildenafil (VIAGRA) 25 MG tablet Take 25 mg by mouth daily as needed for erectile dysfunction.   Yes [provider]  spironolactone (ALDACTONE) 25 MG tablet Take 1 tablet (25 mg total) by mouth daily. 07/23/21  Yes Nomie Buchberger A, FNP  theophylline (THEODUR) 300 MG 12 hr tablet Take 300 mg by mouth 2 (two) times daily. 04/08/20  Yes [provider]  tiotropium (SPIRIVA) 18 MCG inhalation capsule Place 18 mcg into inhaler and inhale daily.   Yes [provider]  VENTOLIN HFA 108 (90 Base) MCG/ACT inhaler Inhale 2 puffs into the lungs  every 6 (six) hours as needed for wheezing or shortness of breath. 05/25/20  Yes [provider]   Review of Systems  Constitutional:  Positive for fatigue. Negative for appetite change.  HENT:  Negative for congestion, postnasal drip and sore throat.   Eyes: Negative.   Respiratory:  Positive for cough and shortness of breath. Negative for chest tightness and wheezing.   Cardiovascular:  Positive for leg swelling ("very little"). Negative for chest pain and palpitations.  Gastrointestinal:  Negative for abdominal distention and abdominal pain.  Endocrine: Negative.   Genitourinary: Negative.   Musculoskeletal:  Negative for back pain.  Skin: Negative.   Allergic/Immunologic: Negative.   Neurological:  Negative for dizziness and light-headedness.  Hematological:  Negative for adenopathy. Does not bruise/bleed easily.  Psychiatric/Behavioral:  Negative for dysphoric mood and sleep disturbance (wearing NIV at night connected to trach). The patient is not nervous/anxious.    Vitals:   04/11/22 0833  BP: (!) 144/91  Pulse: 90  SpO2: 95%  Weight: 254 lb 12.8 oz (115.6 kg)   Wt Readings  from Last 3 Encounters:  04/11/22 254 lb 12.8 oz (115.6 kg)  02/09/22 254 lb (115.2 kg)  02/02/22 252 lb 10.4 oz (114.6 kg)   Lab Results  Component Value Date   CREATININE 1.55 (H) 02/02/2022   CREATININE 1.49 (H) 02/01/2022   CREATININE 1.58 (H) 01/31/2022   Physical Exam Vitals and nursing note reviewed.  Constitutional:      Appearance: He is well-developed.  HENT:     Head: Normocephalic and atraumatic.  Neck:     Vascular: No JVD.  Cardiovascular:     Rate and Rhythm: Normal rate and regular rhythm.  Pulmonary:     Effort: Pulmonary effort is normal.     Breath sounds: No wheezing or rales.  Abdominal:     General: There is no distension.     Palpations: Abdomen is soft.     Tenderness: There is no abdominal tenderness.  Musculoskeletal:        General: No tenderness.     Cervical back: Normal range of motion and neck supple.     Right lower leg: No tenderness. Edema (1+ pitting) present.     Left lower leg: No tenderness. Edema (1+ pitting) present.  Skin:    General: Skin is warm and dry.  Neurological:     Mental Status: He is alert and oriented to person, place, and time.  Psychiatric:        Behavior: Behavior normal.        Thought Content: Thought content normal.   Assessment & Plan:  1: Chronic heart failure with now preserved ejection fraction- - NYHA class II - euvolemic today - not weighing daily but does have working scales; Reminded to call for an overnight weight gain of >2 pounds or a weekly weight gain of >5 pounds - weight unchanged from last visit here 2 months ago - echo 02/09/22: EF of 60-65% with moderate LVH/ LAE, moderate RAE and mild MR. Echo 11/17/2018: EF of 45-50%. - drinking about 4-6 bottles of water daily.  - cooking "very little" with salt but not adding to food.  - saw cardiology ([place]) 12/05/21 - jardiance '10mg'$  daily - metoprolol succinate '100mg'$  daily - entresto 24/'26mg'$  BID - spironolactone '25mg'$  daily - taking lasix/  potassium PRN and says that he last took these ~ 3 days ago - was going to increase entresto today but he's missed doses of his meds the last  2 days so want to make sure his BP can tolerate the increase; has had some low BP's in the past (115/55) - BNP 01/10/22 was 41.3  2: HTN- - BP 144/91 but he hasn't taken his meds yet this morning and missed doses the last 2 days - follows with PCP @ [facility] and has to schedule an appt - BMP 02/02/22 reviewed and showed sodium 141, potassium 4.2, creatinine 1.55 and GFR 54  3: COPD-  - using nocturnal ventilator at night through his trach - saw pulmonologist [name]) 04/05/22  4: Diabetes- - A1c was 6.6% on 01/28/20 - saw nephrology Smith Mince) 01/08/22    Patient did not bring his medications nor a list. Each medication was verbally reviewed with the patient and he was encouraged to bring the bottles to every visit to confirm accuracy of list.  Return in 2 months, sooner if needed.

## 2022-05-03 ENCOUNTER — Emergency Department: Payer: Medicare HMO

## 2022-05-03 ENCOUNTER — Other Ambulatory Visit: Payer: Self-pay

## 2022-05-03 ENCOUNTER — Encounter: Payer: Self-pay | Admitting: Emergency Medicine

## 2022-05-03 ENCOUNTER — Inpatient Hospital Stay
Admission: EM | Admit: 2022-05-03 | Discharge: 2022-05-06 | DRG: 193 | Disposition: A | Discharge status: Home / Self Care | Payer: Medicare HMO | Attending: Internal Medicine | Admitting: Internal Medicine

## 2022-05-03 DIAGNOSIS — I10 Essential (primary) hypertension: Secondary | ICD-10-CM | POA: Diagnosis present

## 2022-05-03 DIAGNOSIS — E1169 Type 2 diabetes mellitus with other specified complication: Secondary | ICD-10-CM | POA: Diagnosis present

## 2022-05-03 DIAGNOSIS — Z8249 Family history of ischemic heart disease and other diseases of the circulatory system: Secondary | ICD-10-CM | POA: Diagnosis not present

## 2022-05-03 DIAGNOSIS — Z794 Long term (current) use of insulin: Secondary | ICD-10-CM

## 2022-05-03 DIAGNOSIS — Z930 Tracheostomy status: Secondary | ICD-10-CM

## 2022-05-03 DIAGNOSIS — N183 Chronic kidney disease, stage 3 unspecified: Secondary | ICD-10-CM | POA: Diagnosis not present

## 2022-05-03 DIAGNOSIS — I480 Paroxysmal atrial fibrillation: Secondary | ICD-10-CM | POA: Diagnosis present

## 2022-05-03 DIAGNOSIS — J111 Influenza due to unidentified influenza virus with other respiratory manifestations: Secondary | ICD-10-CM | POA: Diagnosis present

## 2022-05-03 DIAGNOSIS — Z6833 Body mass index (BMI) 33.0-33.9, adult: Secondary | ICD-10-CM

## 2022-05-03 DIAGNOSIS — Z7901 Long term (current) use of anticoagulants: Secondary | ICD-10-CM

## 2022-05-03 DIAGNOSIS — I482 Chronic atrial fibrillation, unspecified: Secondary | ICD-10-CM | POA: Diagnosis not present

## 2022-05-03 DIAGNOSIS — Z7982 Long term (current) use of aspirin: Secondary | ICD-10-CM

## 2022-05-03 DIAGNOSIS — E7800 Pure hypercholesterolemia, unspecified: Secondary | ICD-10-CM | POA: Diagnosis present

## 2022-05-03 DIAGNOSIS — D860 Sarcoidosis of lung: Secondary | ICD-10-CM | POA: Diagnosis present

## 2022-05-03 DIAGNOSIS — N1831 Chronic kidney disease, stage 3a: Secondary | ICD-10-CM | POA: Diagnosis present

## 2022-05-03 DIAGNOSIS — E669 Obesity, unspecified: Secondary | ICD-10-CM | POA: Diagnosis present

## 2022-05-03 DIAGNOSIS — Z7951 Long term (current) use of inhaled steroids: Secondary | ICD-10-CM

## 2022-05-03 DIAGNOSIS — J9621 Acute and chronic respiratory failure with hypoxia: Secondary | ICD-10-CM | POA: Diagnosis present

## 2022-05-03 DIAGNOSIS — J101 Influenza due to other identified influenza virus with other respiratory manifestations: Secondary | ICD-10-CM | POA: Diagnosis present

## 2022-05-03 DIAGNOSIS — I5032 Chronic diastolic (congestive) heart failure: Secondary | ICD-10-CM | POA: Diagnosis present

## 2022-05-03 DIAGNOSIS — G4733 Obstructive sleep apnea (adult) (pediatric): Secondary | ICD-10-CM | POA: Diagnosis present

## 2022-05-03 DIAGNOSIS — Z85528 Personal history of other malignant neoplasm of kidney: Secondary | ICD-10-CM | POA: Diagnosis not present

## 2022-05-03 DIAGNOSIS — I2510 Atherosclerotic heart disease of native coronary artery without angina pectoris: Secondary | ICD-10-CM | POA: Diagnosis present

## 2022-05-03 DIAGNOSIS — E1122 Type 2 diabetes mellitus with diabetic chronic kidney disease: Secondary | ICD-10-CM | POA: Diagnosis present

## 2022-05-03 DIAGNOSIS — Z905 Acquired absence of kidney: Secondary | ICD-10-CM | POA: Diagnosis not present

## 2022-05-03 DIAGNOSIS — R0902 Hypoxemia: Secondary | ICD-10-CM

## 2022-05-03 DIAGNOSIS — Z1152 Encounter for screening for COVID-19: Secondary | ICD-10-CM

## 2022-05-03 DIAGNOSIS — Z9049 Acquired absence of other specified parts of digestive tract: Secondary | ICD-10-CM

## 2022-05-03 DIAGNOSIS — I130 Hypertensive heart and chronic kidney disease with heart failure and stage 1 through stage 4 chronic kidney disease, or unspecified chronic kidney disease: Secondary | ICD-10-CM | POA: Diagnosis present

## 2022-05-03 DIAGNOSIS — Z87891 Personal history of nicotine dependence: Secondary | ICD-10-CM

## 2022-05-03 DIAGNOSIS — Z7984 Long term (current) use of oral hypoglycemic drugs: Secondary | ICD-10-CM

## 2022-05-03 DIAGNOSIS — R7989 Other specified abnormal findings of blood chemistry: Secondary | ICD-10-CM | POA: Insufficient documentation

## 2022-05-03 DIAGNOSIS — Z833 Family history of diabetes mellitus: Secondary | ICD-10-CM

## 2022-05-03 DIAGNOSIS — R0602 Shortness of breath: Secondary | ICD-10-CM | POA: Diagnosis present

## 2022-05-03 DIAGNOSIS — J441 Chronic obstructive pulmonary disease with (acute) exacerbation: Principal | ICD-10-CM | POA: Diagnosis present

## 2022-05-03 DIAGNOSIS — Z8673 Personal history of transient ischemic attack (TIA), and cerebral infarction without residual deficits: Secondary | ICD-10-CM

## 2022-05-03 DIAGNOSIS — Z79899 Other long term (current) drug therapy: Secondary | ICD-10-CM

## 2022-05-03 DIAGNOSIS — I252 Old myocardial infarction: Secondary | ICD-10-CM

## 2022-05-03 DIAGNOSIS — Z880 Allergy status to penicillin: Secondary | ICD-10-CM

## 2022-05-03 LAB — CBC
HCT: 43.3 % (ref 39.0–52.0)
Hemoglobin: 13.8 g/dL (ref 13.0–17.0)
MCH: 27.9 pg (ref 26.0–34.0)
MCHC: 31.9 g/dL (ref 30.0–36.0)
MCV: 87.5 fL (ref 80.0–100.0)
Platelets: 149 10*3/uL — ABNORMAL LOW (ref 150–400)
RBC: 4.95 MIL/uL (ref 4.22–5.81)
RDW: 13.4 % (ref 11.5–15.5)
WBC: 4 10*3/uL (ref 4.0–10.5)
nRBC: 0 % (ref 0.0–0.2)

## 2022-05-03 LAB — RESP PANEL BY RT-PCR (RSV, FLU A&B, COVID)  RVPGX2
Influenza A by PCR: NEGATIVE
Influenza B by PCR: POSITIVE — AB
Resp Syncytial Virus by PCR: NEGATIVE
SARS Coronavirus 2 by RT PCR: NEGATIVE

## 2022-05-03 LAB — BASIC METABOLIC PANEL
Anion gap: 9 (ref 5–15)
BUN: 23 mg/dL — ABNORMAL HIGH (ref 6–20)
CO2: 22 mmol/L (ref 22–32)
Calcium: 8.4 mg/dL — ABNORMAL LOW (ref 8.9–10.3)
Chloride: 106 mmol/L (ref 98–111)
Creatinine, Ser: 1.64 mg/dL — ABNORMAL HIGH (ref 0.61–1.24)
GFR, Estimated: 50 mL/min — ABNORMAL LOW (ref >60)
Glucose, Bld: 106 mg/dL — ABNORMAL HIGH (ref 70–99)
Potassium: 4.2 mmol/L (ref 3.5–5.1)
Sodium: 137 mmol/L (ref 135–145)

## 2022-05-03 LAB — TROPONIN I (HIGH SENSITIVITY)
Troponin I (High Sensitivity): 31 ng/L — ABNORMAL HIGH (ref <18)
Troponin I (High Sensitivity): 32 ng/L — ABNORMAL HIGH (ref <18)

## 2022-05-03 LAB — GLUCOSE, CAPILLARY: Glucose-Capillary: 99 mg/dL (ref 70–99)

## 2022-05-03 LAB — BLOOD GAS, VENOUS
Acid-Base Excess: 1 mmol/L (ref 0.0–2.0)
Bicarbonate: 27.1 mmol/L (ref 20.0–28.0)
O2 Saturation: 98.6 %
Patient temperature: 37
pCO2, Ven: 48 mmHg (ref 44–60)
pH, Ven: 7.36 (ref 7.25–7.43)
pO2, Ven: 85 mmHg — ABNORMAL HIGH (ref 32–45)

## 2022-05-03 LAB — BRAIN NATRIURETIC PEPTIDE: B Natriuretic Peptide: 14.5 pg/mL (ref 0.0–100.0)

## 2022-05-03 LAB — CBG MONITORING, ED
Glucose-Capillary: 133 mg/dL — ABNORMAL HIGH (ref 70–99)
Glucose-Capillary: 165 mg/dL — ABNORMAL HIGH (ref 70–99)
Glucose-Capillary: 107 mg/dL — ABNORMAL HIGH (ref 70–99)

## 2022-05-03 MED ORDER — OSELTAMIVIR PHOSPHATE 75 MG PO CAPS
75.0000 mg | ORAL_CAPSULE | Freq: Once | ORAL | Status: AC
  Administered 2022-05-03: 75 mg via ORAL
  Filled 2022-05-03: qty 1

## 2022-05-03 MED ORDER — INSULIN ASPART 100 UNIT/ML IJ SOLN
6.0000 [IU] | Freq: Three times a day (TID) | INTRAMUSCULAR | Status: DC
  Administered 2022-05-03 – 2022-05-06 (×10): 6 [IU] via SUBCUTANEOUS
  Filled 2022-05-03 (×10): qty 1

## 2022-05-03 MED ORDER — GUAIFENESIN-DM 100-10 MG/5ML PO SYRP: 10.0000 mL | ORAL_SOLUTION | ORAL | Status: DC | PRN

## 2022-05-03 MED ORDER — APIXABAN 5 MG PO TABS
5.0000 mg | ORAL_TABLET | Freq: Two times a day (BID) | ORAL | Status: DC
  Administered 2022-05-03 – 2022-05-06 (×7): 5 mg via ORAL
  Filled 2022-05-03 (×7): qty 1

## 2022-05-03 MED ORDER — TIOTROPIUM BROMIDE MONOHYDRATE 18 MCG IN CAPS
18.0000 ug | ORAL_CAPSULE | Freq: Every day | RESPIRATORY_TRACT | Status: DC
  Administered 2022-05-05 – 2022-05-06 (×2): 18 ug via RESPIRATORY_TRACT
  Filled 2022-05-03: qty 5

## 2022-05-03 MED ORDER — ONDANSETRON HCL 4 MG PO TABS: 4.0000 mg | ORAL_TABLET | Freq: Four times a day (QID) | ORAL | Status: DC | PRN

## 2022-05-03 MED ORDER — SPIRONOLACTONE 25 MG PO TABS
25.0000 mg | ORAL_TABLET | Freq: Every day | ORAL | Status: DC
  Administered 2022-05-03 – 2022-05-06 (×4): 25 mg via ORAL
  Filled 2022-05-03 (×4): qty 1

## 2022-05-03 MED ORDER — TIOTROPIUM BROMIDE MONOHYDRATE 18 MCG IN CAPS: 18.0000 ug | ORAL_CAPSULE | Freq: Every day | RESPIRATORY_TRACT | Status: DC

## 2022-05-03 MED ORDER — METHYLPREDNISOLONE SODIUM SUCC 125 MG IJ SOLR
125.0000 mg | Freq: Once | INTRAMUSCULAR | Status: AC
  Administered 2022-05-03: 125 mg via INTRAVENOUS
  Filled 2022-05-03: qty 2

## 2022-05-03 MED ORDER — ONDANSETRON HCL 4 MG/2ML IJ SOLN
4.0000 mg | Freq: Three times a day (TID) | INTRAMUSCULAR | Status: DC | PRN
  Administered 2022-05-03: 4 mg via INTRAVENOUS
  Filled 2022-05-03: qty 2

## 2022-05-03 MED ORDER — SODIUM CHLORIDE 0.9 % IV SOLN
100.0000 mg | Freq: Two times a day (BID) | INTRAVENOUS | Status: DC
  Administered 2022-05-03 (×2): 100 mg via INTRAVENOUS
  Filled 2022-05-03 (×3): qty 100

## 2022-05-03 MED ORDER — SACUBITRIL-VALSARTAN 24-26 MG PO TABS
1.0000 | ORAL_TABLET | Freq: Two times a day (BID) | ORAL | Status: DC
  Administered 2022-05-03 – 2022-05-06 (×7): 1 via ORAL
  Filled 2022-05-03 (×8): qty 1

## 2022-05-03 MED ORDER — INSULIN ASPART 100 UNIT/ML IJ SOLN: 0.0000 [IU] | Freq: Every day | INTRAMUSCULAR | Status: DC

## 2022-05-03 MED ORDER — METHYLPREDNISOLONE SODIUM SUCC 125 MG IJ SOLR
125.0000 mg | Freq: Two times a day (BID) | INTRAMUSCULAR | Status: AC
  Administered 2022-05-03 – 2022-05-04 (×2): 125 mg via INTRAVENOUS
  Filled 2022-05-03 (×2): qty 2

## 2022-05-03 MED ORDER — SODIUM CHLORIDE 0.9 % IV SOLN: INTRAVENOUS | Status: DC

## 2022-05-03 MED ORDER — ATORVASTATIN CALCIUM 20 MG PO TABS
40.0000 mg | ORAL_TABLET | Freq: Every day | ORAL | Status: DC
  Administered 2022-05-03 – 2022-05-05 (×3): 40 mg via ORAL
  Filled 2022-05-03 (×3): qty 2

## 2022-05-03 MED ORDER — IPRATROPIUM-ALBUTEROL 0.5-2.5 (3) MG/3ML IN SOLN
3.0000 mL | Freq: Four times a day (QID) | RESPIRATORY_TRACT | Status: DC
  Administered 2022-05-03 – 2022-05-05 (×10): 3 mL via RESPIRATORY_TRACT
  Filled 2022-05-03 (×11): qty 3

## 2022-05-03 MED ORDER — PREDNISONE 20 MG PO TABS
40.0000 mg | ORAL_TABLET | Freq: Every day | ORAL | Status: DC
  Administered 2022-05-06: 40 mg via ORAL
  Filled 2022-05-03: qty 2

## 2022-05-03 MED ORDER — ALBUTEROL SULFATE (2.5 MG/3ML) 0.083% IN NEBU
2.5000 mg | INHALATION_SOLUTION | RESPIRATORY_TRACT | Status: AC | PRN
  Administered 2022-05-03: 2.5 mg via RESPIRATORY_TRACT
  Filled 2022-05-03: qty 3

## 2022-05-03 MED ORDER — ONDANSETRON HCL 4 MG/2ML IJ SOLN: 4.0000 mg | Freq: Four times a day (QID) | INTRAMUSCULAR | Status: DC | PRN

## 2022-05-03 MED ORDER — INSULIN ASPART 100 UNIT/ML IJ SOLN
0.0000 [IU] | Freq: Three times a day (TID) | INTRAMUSCULAR | Status: DC
  Administered 2022-05-03: 4 [IU] via SUBCUTANEOUS
  Administered 2022-05-03: 3 [IU] via SUBCUTANEOUS
  Administered 2022-05-04: 4 [IU] via SUBCUTANEOUS
  Administered 2022-05-04 – 2022-05-05 (×3): 3 [IU] via SUBCUTANEOUS
  Filled 2022-05-03 (×3): qty 1

## 2022-05-03 MED ORDER — THEOPHYLLINE ER 300 MG PO TB12
300.0000 mg | ORAL_TABLET | Freq: Two times a day (BID) | ORAL | Status: DC
  Administered 2022-05-03 – 2022-05-06 (×7): 300 mg via ORAL
  Filled 2022-05-03 (×8): qty 1

## 2022-05-03 MED ORDER — OSELTAMIVIR PHOSPHATE 75 MG PO CAPS
75.0000 mg | ORAL_CAPSULE | Freq: Two times a day (BID) | ORAL | Status: DC
  Administered 2022-05-03 – 2022-05-06 (×6): 75 mg via ORAL
  Filled 2022-05-03 (×6): qty 1

## 2022-05-03 MED ORDER — METOPROLOL SUCCINATE ER 100 MG PO TB24
100.0000 mg | ORAL_TABLET | Freq: Every day | ORAL | Status: DC
  Administered 2022-05-03 – 2022-05-06 (×4): 100 mg via ORAL
  Filled 2022-05-03 (×3): qty 1
  Filled 2022-05-03: qty 2

## 2022-05-03 MED ORDER — ASPIRIN 81 MG PO TBEC
81.0000 mg | DELAYED_RELEASE_TABLET | Freq: Every day | ORAL | Status: DC
  Administered 2022-05-03 – 2022-05-06 (×4): 81 mg via ORAL
  Filled 2022-05-03 (×4): qty 1

## 2022-05-03 MED ORDER — MELATONIN 5 MG PO TABS
5.0000 mg | ORAL_TABLET | Freq: Every day | ORAL | Status: DC
  Administered 2022-05-03 – 2022-05-04 (×2): 5 mg via ORAL
  Filled 2022-05-03 (×3): qty 1

## 2022-05-03 MED ORDER — IPRATROPIUM-ALBUTEROL 0.5-2.5 (3) MG/3ML IN SOLN
9.0000 mL | Freq: Once | RESPIRATORY_TRACT | Status: AC
  Administered 2022-05-03: 9 mL via RESPIRATORY_TRACT
  Filled 2022-05-03: qty 3

## 2022-05-03 NOTE — Assessment & Plan Note (Signed)
BP stable Titrate home regimen 

## 2022-05-03 NOTE — Assessment & Plan Note (Signed)
+   cough, wheezing increased WOB in setting of baseline COPD and chronic resp failure on 2L w/ chronic trach in place on vent use at night  Influenza B+  Start on tamiflu  COPD exacerbation treatment protocol  Resp status near relative baseline-follow closely

## 2022-05-03 NOTE — Assessment & Plan Note (Signed)
Trop in 53s on presentation  No active CP  Suspect miminal heart strain w/ decompensated resp failure w/ influenza B Looks to be chronic  On ASA  Monitor

## 2022-05-03 NOTE — ED Notes (Signed)
Patient eating breakfast tray at this time. 

## 2022-05-03 NOTE — ED Notes (Signed)
Nurse spoke to respiratory concerning supplies for patient to suction his trach, respiratory to bring supplies to room.

## 2022-05-03 NOTE — Progress Notes (Signed)
RT called to room to assess for trach equipment needs. Pt has all necessary trach maintenance equipment in room. Cold humidification set up at the time.

## 2022-05-03 NOTE — H&P (Signed)
History and Physical    Patient: [name] [id number] DOB: 05-Feb-1971 DOA: 05/03/2022 DOS: the patient was seen and examined on 05/03/2022 PCP: [facility]  Patient coming from: Home  Chief Complaint:  Chief Complaint  Patient presents with   Chest Pain   HPI: [name] is a 52 y.o. 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  Patient reports approximately 4 to 5 days of increased work of breathing, cough, malaise.  No reported sick contacts.  Has been using home O2 at 2 L.  Sometimes increased to 3.  No chest pain.  No abdominal pain, nausea or vomiting.  No hemiparesis or confusion.  Has been using home inhalers with minimal improvement in symptoms. Presented to the ER afebrile, hemodynamically stable.  Required 2-3 L nasal cannula to keep O2 sats greater than 91%.  White count 4, hemoglobin 13.8, platelets 149, troponin 32, creatinine 1.64, chest x-ray grossly unchanged with noted chronic L base collapse/consolidation. Influenza B+.  Review of Systems: As mentioned in the history of present illness. All other systems reviewed and are negative. 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[facility])    Past Surgical History:  Procedure Laterality Date   APPENDECTOMY     COLONOSCOPY WITH PROPOFOL N/A 02/02/2021   Procedure: COLONOSCOPY WITH PROPOFOL;  Surgeon: Jonathon Bellows, MD;  Location[facility] ENDOSCOPY;  Service: Gastroenterology;  Laterality: N/A;   COLONOSCOPY WITH PROPOFOL N/A 09/01/2021   Procedure: COLONOSCOPY WITH PROPOFOL;  Surgeon: Jonathon Bellows, MD;  Location: [facility] ENDOSCOPY;  Service: Gastroenterology;  Laterality: N/A;   KNEE SURGERY Right    PARTIAL NEPHRECTOMY     TRACHEOSTOMY TUBE PLACEMENT N/A 01/14/2020   Procedure: TRACHEOSTOMY;  Surgeon: Clyde Canterbury, MD;  Location: [facility] ORS;  Service: ENT;  Laterality: N/A;   Social History:  reports that he quit smoking about 2 years ago. His smoking use included cigarettes. He has never used smokeless tobacco. He reports current alcohol use. He reports current drug use. Drug: Marijuana.  Allergies  Allergen Reactions   Penicillins Anaphylaxis and Other (See Comments)    Has patient had a PCN reaction causing immediate rash, facial/tongue/throat swelling, SOB or lightheadedness with hypotension: Yes Has patient had a PCN reaction causing severe rash involving mucus membranes or skin necrosis: No Has patient had a PCN reaction that required hospitalization: No Has patient had a PCN reaction occurring within the last  10 years: No If all of the above answers are "NO", then may proceed with Cephalosporin use.     Family History  Problem Relation Age of Onset   Hypertension Mother    Heart disease Mother    Diabetes Mother    Cancer Father     Prior to Admission medications   Medication Sig Start Date End Date Taking? Authorizing Provider  aspirin EC 81 MG tablet Take 81 mg by mouth  daily. Swallow whole.   Yes [provider]  atorvastatin (LIPITOR) 40 MG tablet TAKE 1 TABLET BY MOUTH AT BEDTIME 01/17/22  Yes Hackney, Tina A, FNP  ELIQUIS 5 MG TABS tablet Take 5 mg by mouth 2 (two) times daily. 07/11/20  Yes [provider]  empagliflozin (JARDIANCE) 10 MG TABS tablet Take 1 tablet (10 mg total) by mouth daily before breakfast. 01/05/22  Yes Hackney, Tina A, FNP  fluticasone-salmeterol (ADVAIR) 250-50 MCG/ACT AEPB Inhale 1 puff into the lungs every 12 (twelve) hours. 05/23/20  Yes [provider]  ipratropium-albuterol (DUONEB) 0.5-2.5 (3) MG/3ML SOLN Take 3 mLs by nebulization every 4 (four) hours as needed. 02/02/22  Yes Hosie Poisson, MD  metoprolol succinate (TOPROL-XL) 100 MG 24 hr tablet Take 1 tablet (100 mg total) by mouth daily. Take with or immediately following a meal. 08/02/21  Yes Hackney, Tina A, FNP  NOVOLOG FLEXPEN 100 UNIT/ML FlexPen Inject 0-16 Units into the skin 3 (three) times daily with meals. Based on SSI 02/19/20  Yes [provider]  sacubitril-valsartan (ENTRESTO) 24-26 MG Take 1 tablet by mouth 2 (two) times daily. 08/18/21  Yes Darylene Price A, FNP  spironolactone (ALDACTONE) 25 MG tablet Take 1 tablet (25 mg total) by mouth daily. 07/23/21  Yes Hackney, Tina A, FNP  theophylline (THEODUR) 300 MG 12 hr tablet Take 300 mg by mouth 2 (two) times daily. 04/08/20  Yes [provider]  tiotropium (SPIRIVA) 18 MCG inhalation capsule Place 18 mcg into inhaler and inhale daily.   Yes [provider]  benzonatate (TESSALON PERLES) 100 MG capsule Take 2 capsules (200 mg total) by mouth 3 (three) times daily as needed for cough. Patient not taking: Reported on 05/03/2022 02/02/22 02/02/23  Hosie Poisson, MD  furosemide (LASIX) 40 MG tablet Take 1 tablet (40 mg total) by mouth daily. Patient taking differently: Take 40 mg by mouth daily as needed. 09/15/20   Lorella Nimrod, MD  guaiFENesin-dextromethorphan (ROBITUSSIN DM)  100-10 MG/5ML syrup Take 10 mLs by mouth every 4 (four) hours as needed for cough. 02/02/22   Hosie Poisson, MD  melatonin 5 MG TABS Take 5 mg by mouth at bedtime.    [provider]  Menthol-Methyl Salicylate (MUSCLE RUB) 10-15 % CREA Apply 1 Application topically as needed for muscle pain. 01/21/22   Lucillie Garfinkel, MD  nitroGLYCERIN (NITROSTAT) 0.4 MG SL tablet Place 0.4 mg under the tongue every 5 (five) minutes as needed.    [provider]  Potassium Chloride ER 20 MEQ TBCR Take 20 mEq by mouth as needed (if takes furosemide). 04/05/20   [provider]  sildenafil (VIAGRA) 25 MG tablet Take 25 mg by mouth daily as needed for erectile dysfunction.    [provider]  VENTOLIN HFA 108 (90 Base) MCG/ACT inhaler Inhale 2 puffs into the lungs every 6 (six) hours as needed for wheezing or shortness of breath. Patient not taking: Reported on 05/03/2022 05/25/20   [provider]    Physical Exam: Vitals:   05/03/22  [id number] 05/03/22 0434 05/03/22 0500 05/03/22 0652  BP:  136/85    Pulse:  (!) 101  100  Resp:  (!) 24  (!) 23  Temp:  98.6 F (37 C)    TempSrc:  Oral    SpO2:  95% 95% 91%  Weight: 113.9 kg     Height: 6\' 1"  (1.854 m)      Physical Exam Constitutional:      General: He is not in acute distress.    Appearance: He is normal weight.  HENT:     Head: Normocephalic and atraumatic.     Mouth/Throat:     Mouth: Mucous membranes are moist.  Eyes:     Pupils: Pupils are equal, round, and reactive to light.  Neck:     Comments: Trach stable   Cardiovascular:     Rate and Rhythm: Normal rate and regular rhythm.  Pulmonary:     Effort: Pulmonary effort is normal.     Breath sounds: Wheezing present.  Abdominal:     General: Bowel sounds are normal.  Musculoskeletal:        General: Normal range of motion.     Cervical back: Normal range of motion.  Skin:    General: Skin is warm.  Neurological:     General: No focal deficit present.   Psychiatric:        Mood and Affect: Mood normal.     Data Reviewed:  There are no new results to review at this time. DG Chest Portable 1 View CLINICAL DATA:  Chest pain  EXAM: PORTABLE CHEST 1 VIEW  COMPARISON:  01/28/2022  FINDINGS: Asymmetric elevation left hemidiaphragm with similar left base collapse/consolidation and tiny effusion. Probable tiny right pleural effusion. Bandlike opacity in the infrahilar right lung is stable consistent with chronic atelectasis or scarring. Scattered additional areas of architectural distortion and scarring are noted bilaterally. Tracheostomy tube remains in place. No pulmonary edema. Telemetry leads overlie the chest.  IMPRESSION: 1. No substantial change. Left base collapse/consolidation with tiny bilateral effusions. 2. Scattered areas of architectural distortion/scarring bilaterally.  Electronically Signed   By: Misty Stanley M.D.   On: 05/03/2022 05:03  Lab Results  Component Value Date   WBC 4.0 05/03/2022   HGB 13.8 05/03/2022   HCT 43.3 05/03/2022   MCV 87.5 05/03/2022   PLT 149 (L) 0000000   Last metabolic panel Lab Results  Component Value Date   GLUCOSE 106 (H) 05/03/2022   NA 137 05/03/2022   K 4.2 05/03/2022   CL 106 05/03/2022   CO2 22 05/03/2022   BUN 23 (H) 05/03/2022   CREATININE 1.64 (H) 05/03/2022   GFRNONAA 50 (L) 05/03/2022   CALCIUM 8.4 (L) 05/03/2022   PHOS 3.5 01/30/2022   PROT 7.3 01/28/2022   ALBUMIN 3.6 01/28/2022   LABGLOB 2.9 01/03/2020   AGRATIO 1.1 01/03/2020   BILITOT 0.8 01/28/2022   ALKPHOS 66 01/28/2022   AST 23 01/28/2022   ALT 20 01/28/2022   ANIONGAP 9 05/03/2022    Assessment and Plan: * Influenzal bronchitis + cough, wheezing increased WOB in setting of baseline COPD and chronic resp failure on 2L w/ chronic trach in place on vent use at night  Influenza B+  Start on tamiflu  COPD exacerbation treatment protocol  Resp status near relative baseline-follow  closely  Acute on chronic respiratory failure with hypoxia (HCC) Decompensated chronic respiratory failure in setting of influenza B and COPD with chronic trach in place  + cough, increased  WOB, wheezing x 4-5 days On 2L chronically w/ transition to O2 through vent at night via trach  O2 demands have been relatively stable-currently on 2-3L [place] w/ pt being able to speak in full sentences  CXR relatively unchanged in comparison to prior  Blood gas relatively stable  IV solumedrol  Duonebs  IV doxy for COPD coverage Tamiflu  Cont O2 and home vent  Reassess w/ pulm eval as clinically appropriate     OSA and COPD overlap syndrome (HCC) Active COPD exacerbation w/ influenza B treatment  Resp fairly stable at present though w/ increased risk of decompensation given baseline lung disease and chronic resp failure  Cont home O2 and vent via trach at night  Monitor closely PCCM consult as clinically indicated     Elevated troponin Trop in 30s on presentation  No active CP  Suspect miminal heart strain w/ decompensated resp failure w/ influenza B Looks to be chronic  On ASA  Monitor   AF (paroxysmal atrial fibrillation) (HCC) EKG NSR w/ rate in the 90s Cont home BB and eliquis  Follow   CKD stage 3 due to type 2 diabetes mellitus (HCC) Baseline Cr 1.3-1.6  Cr today 1.6  Monitor  Minimize nephrotoxins   Chronic diastolic heart failure (HCC) 2D ECHO 02/2022 w/ EF 60-65% and grade 1 diastolic dysfunction  Euvolemic  Cont home regimen    HTN (hypertension) BP stable  Titrate home regimen        Advance Care Planning:   Code Status: Full Code   Consults: None at present- PCCM as clinically appropriate   Family Communication: No family at bedside   Severity of Illness: The appropriate patient status for this patient is INPATIENT. Inpatient status is judged to be reasonable and necessary in order to provide the required intensity of service to ensure the patient's  safety. The patient's presenting symptoms, physical exam findings, and initial radiographic and laboratory data in the context of their chronic comorbidities is felt to place them at high risk for further clinical deterioration. Furthermore, it is not anticipated that the patient will be medically stable for discharge from the hospital within 2 midnights of admission.   * I certify that at the point of admission it is my clinical judgment that the patient will require inpatient hospital care spanning beyond 2 midnights from the point of admission due to high intensity of service, high risk for further deterioration and high frequency of surveillance required.*  Author: Deneise Lever, MD 05/03/2022 8:00 AM  For on call review [email or web address].

## 2022-05-03 NOTE — ED Triage Notes (Addendum)
Pt to room 16 via w/c; reports onset upper chest tightness and SHOB that began around 11pm; pt with hx CHF, COPD, tracheostomy in place; chronic O2 at 2l/min via [place]; st recent cold symptoms as well

## 2022-05-03 NOTE — Assessment & Plan Note (Signed)
EKG NSR w/ rate in the 90s Cont home BB and eliquis  Follow

## 2022-05-03 NOTE — ED Notes (Signed)
COPD with chronic trach and 2L home O2, hypoxic to 88% on his home 2L, COPD exacerbation and + influenza B. CXR negative.

## 2022-05-03 NOTE — Assessment & Plan Note (Signed)
Baseline Cr 1.3-1.6  Cr today 1.6  Monitor  Minimize nephrotoxins

## 2022-05-03 NOTE — ED Provider Notes (Signed)
[facility] Provider Note    Event Date/Time   First MD Initiated Contact with Patient 05/03/22 [phone]     (approximate)   History   Chest Pain   HPI  [name] is a 52 y.o. male past medical history significant for COPD on chronic 2 L nasal cannula, tracheostomy, who presents to the emergency department with shortness of breath and cough.  Patient states that he started developing symptoms of a cold on Sunday and has had progressively worsening symptoms since that time.  Productive cough.  Increased work of breathing.  No recent antibiotic use.  Denies any fever but believes he had low-grade fevers.  Denies any abdominal pain.  Denies any diarrhea.  1 episode of vomiting on Sunday.  Denies any further episodes of vomiting.  No dysuria, urinary urgency or frequency.  Denies history of PE or DVT.   Physical Exam   Triage Vital Signs: ED Triage Vitals  Enc Vitals Group     BP 05/03/22 0434 136/85     Pulse Rate 05/03/22 0434 (!) 101     Resp 05/03/22 0434 (!) 24     Temp 05/03/22 0434 98.6 F (37 C)     Temp Source 05/03/22 0434 Oral     SpO2 05/03/22 0434 95 %     Weight 05/03/22 0433 251 lb (113.9 kg)     Height 05/03/22 0433 6\' 1"  (1.854 m)     Head Circumference --      Peak Flow --      Pain Score 05/03/22 0432 5     Pain Loc --      Pain Edu? --      Excl. in [place]? --     Most recent vital signs: Vitals:   05/03/22 0434 05/03/22 0500  BP: 136/85   Pulse: (!) 101   Resp: (!) 24   Temp: 98.6 F (37 C)   SpO2: 95% 95%    Physical Exam Constitutional:      General: He is in acute distress.     Appearance: He is well-developed.  HENT:     Head: Atraumatic.  Eyes:     Conjunctiva/sclera: Conjunctivae normal.  Cardiovascular:     Rate and Rhythm: Regular rhythm. Tachycardia present.  Pulmonary:     Effort: Tachypnea and respiratory distress present.     Comments: Tracheostomy in place.  Tachypneic with diffuse inspiratory and  expiratory wheezing throughout all lung fields.  Speaking in 2 word sentences. Abdominal:     Palpations: Abdomen is soft.  Musculoskeletal:     Cervical back: Normal range of motion.     Right lower leg: No edema.     Left lower leg: No edema.  Skin:    General: Skin is warm.     Capillary Refill: Capillary refill takes less than 2 seconds.  Neurological:     Mental Status: He is alert. Mental status is at baseline.     IMPRESSION / MDM / ASSESSMENT AND PLAN / ED COURSE  I reviewed the triage vital signs and the nursing notes.  Differential diagnosis including COPD exacerbation, pneumonia, viral illness including COVID/influenza, CHF  Patient given DuoNeb treatments, IV Solu-Medrol given his diffuse wheezing on exam.  EKG  I, Nathaniel Man, the attending physician, personally viewed and interpreted this ECG.   Rate: Normal  Rhythm: Normal sinus  Axis: Normal  Intervals: Right bundle branch block  ST&T Change: ST depression to the lateral leads which is  unchanged when compared to prior EKG.  No tachycardic or bradycardic dysrhythmias while on cardiac telemetry.  RADIOLOGY I independently reviewed imaging, my interpretation of imaging: Chest x-ray without significant change when compared to prior.  Focal lineal area of atelectasis versus pneumonia but appears similar when compared to prior chest x-ray in December.  Chest x-ray was read as no acute change.  LABS (all labs ordered are listed, but only abnormal results are displayed) Labs interpreted as -    Labs Reviewed  RESP PANEL BY RT-PCR (RSV, FLU A&B, COVID)  [id number] - Abnormal; Notable for the following components:      Result Value   Influenza B by PCR POSITIVE (*)    All other components within normal limits  CBC - Abnormal; Notable for the following components:   Platelets 149 (*)    All other components within normal limits  BASIC METABOLIC PANEL - Abnormal; Notable for the following components:   Glucose,  Bld 106 (*)    BUN 23 (*)    Creatinine, Ser 1.64 (*)    Calcium 8.4 (*)    GFR, Estimated 50 (*)    All other components within normal limits  BLOOD GAS, VENOUS - Abnormal; Notable for the following components:   pO2, Ven 85 (*)    All other components within normal limits  TROPONIN I (HIGH SENSITIVITY) - Abnormal; Notable for the following components:   Troponin I (High Sensitivity) 31 (*)    All other components within normal limits  BRAIN NATRIURETIC PEPTIDE  TROPONIN I (HIGH SENSITIVITY)     MDM  Influenza B positive.  No significant hypercarbia.  Troponin mildly elevated.  Low suspicion for ACS.  Most likely secondary elevation secondary to his COPD exacerbation.  With ambulation on his 2 L had increased work of breathing and hypoxia to 88%.  Placed on 3 L nasal cannula.  Started on Tamiflu.  Consulted hospitalist for admission for acute on chronic hypoxic respiratory failure in the setting of COPD exacerbation and influenza B.     PROCEDURES:  Critical Care performed: No  Procedures  Patient's presentation is most consistent with acute presentation with potential threat to life or bodily function.   MEDICATIONS ORDERED IN ED: Medications  oseltamivir (TAMIFLU) capsule 30 mg (has no administration in time range)  methylPREDNISolone sodium succinate (SOLU-MEDROL) 125 mg/2 mL injection 125 mg (125 mg Intravenous Given 05/03/22 0511)  ipratropium-albuterol (DUONEB) 0.5-2.5 (3) MG/3ML nebulizer solution 9 mL (9 mLs Nebulization Given 05/03/22 0510)    FINAL CLINICAL IMPRESSION(S) / ED DIAGNOSES   Final diagnoses:  COPD exacerbation ([place])  Influenza B  Hypoxia     Rx / DC Orders   ED Discharge Orders     None        Note:  This document was prepared using Dragon voice recognition software and may include unintentional dictation errors.   Nathaniel Man, MD 05/03/22 [phone]

## 2022-05-03 NOTE — Assessment & Plan Note (Signed)
Decompensated chronic respiratory failure in setting of influenza B and COPD with chronic trach in place  + cough, increased WOB, wheezing x 4-5 days On 2L chronically w/ transition to O2 through vent at night via trach  O2 demands have been relatively stable-currently on 2-3L [place] w/ pt being able to speak in full sentences  CXR relatively unchanged in comparison to prior  Blood gas relatively stable  IV solumedrol  Duonebs  IV doxy for COPD coverage Tamiflu  Cont O2 and home vent  Reassess w/ pulm eval as clinically appropriate

## 2022-05-03 NOTE — ED Notes (Signed)
On ambulation with Baseline Oxygen at 2L, patients O2 saturation dropped to 88% walking 10 ft from room. Became fatigued immediately upon exertion

## 2022-05-03 NOTE — Assessment & Plan Note (Signed)
2D ECHO 02/2022 w/ EF 60-65% and grade 1 diastolic dysfunction  Euvolemic  Cont home regimen

## 2022-05-03 NOTE — Progress Notes (Signed)
Emergency trach and trach equipment list placed in room at this time.

## 2022-05-03 NOTE — Assessment & Plan Note (Signed)
Active COPD exacerbation w/ influenza B treatment  Resp fairly stable at present though w/ increased risk of decompensation given baseline lung disease and chronic resp failure  Cont home O2 and vent via trach at night  Monitor closely PCCM consult as clinically indicated

## 2022-05-04 DIAGNOSIS — J111 Influenza due to unidentified influenza virus with other respiratory manifestations: Secondary | ICD-10-CM | POA: Diagnosis not present

## 2022-05-04 LAB — CBC
HCT: 42.2 % (ref 39.0–52.0)
Hemoglobin: 13.7 g/dL (ref 13.0–17.0)
MCH: 27.4 pg (ref 26.0–34.0)
MCHC: 32.5 g/dL (ref 30.0–36.0)
MCV: 84.4 fL (ref 80.0–100.0)
Platelets: 159 10*3/uL (ref 150–400)
RBC: 5 MIL/uL (ref 4.22–5.81)
RDW: 13.3 % (ref 11.5–15.5)
WBC: 4.5 10*3/uL (ref 4.0–10.5)
nRBC: 0 % (ref 0.0–0.2)

## 2022-05-04 LAB — HIV ANTIBODY (ROUTINE TESTING W REFLEX): HIV Screen 4th Generation wRfx: NONREACTIVE

## 2022-05-04 LAB — COMPREHENSIVE METABOLIC PANEL
ALT: 25 U/L (ref 0–44)
AST: 24 U/L (ref 15–41)
Albumin: 3.5 g/dL (ref 3.5–5.0)
Alkaline Phosphatase: 65 U/L (ref 38–126)
Anion gap: 7 (ref 5–15)
BUN: 29 mg/dL — ABNORMAL HIGH (ref 6–20)
CO2: 25 mmol/L (ref 22–32)
Calcium: 8.4 mg/dL — ABNORMAL LOW (ref 8.9–10.3)
Chloride: 106 mmol/L (ref 98–111)
Creatinine, Ser: 1.51 mg/dL — ABNORMAL HIGH (ref 0.61–1.24)
GFR, Estimated: 55 mL/min — ABNORMAL LOW (ref >60)
Glucose, Bld: 142 mg/dL — ABNORMAL HIGH (ref 70–99)
Potassium: 4.9 mmol/L (ref 3.5–5.1)
Sodium: 138 mmol/L (ref 135–145)
Total Bilirubin: 0.7 mg/dL (ref 0.3–1.2)
Total Protein: 7.1 g/dL (ref 6.5–8.1)

## 2022-05-04 LAB — GLUCOSE, CAPILLARY
Glucose-Capillary: 174 mg/dL — ABNORMAL HIGH (ref 70–99)
Glucose-Capillary: 146 mg/dL — ABNORMAL HIGH (ref 70–99)
Glucose-Capillary: 148 mg/dL — ABNORMAL HIGH (ref 70–99)
Glucose-Capillary: 128 mg/dL — ABNORMAL HIGH (ref 70–99)

## 2022-05-04 LAB — HEMOGLOBIN A1C
Hgb A1c MFr Bld: 6.3 % — ABNORMAL HIGH (ref 4.8–5.6)
Mean Plasma Glucose: 134 mg/dL

## 2022-05-04 MED ORDER — ALBUTEROL SULFATE (2.5 MG/3ML) 0.083% IN NEBU: 2.5000 mg | INHALATION_SOLUTION | RESPIRATORY_TRACT | Status: DC | PRN

## 2022-05-04 NOTE — Evaluation (Signed)
Physical Therapy Evaluation Patient Details Name: [name] MRN: [id number] DOB: 14-Oct-1970 Today's Date: 05/04/2022  History of Present Illness  Pt is a 52 y/o M admitted on 05/03/22 after presenting with c/o acute on chronic respiratory failure with hypoxia & influenza B. Pt is being treated for influenzal bronchitis. PMH: chronic respiratory failure on 2L O2 with chronic trach in place, sarcoidosis, a-fib on anticoagulation, chronic diastolic heart failure, CKD stage 3, COPD, HTN, CAD, sleep apnea, DM2  Clinical Impression  Pt seen for PT evaluation with pt agreeable to tx. Pt reports prior to admission he was independent, living with his wife. On this date, pt is independent with ambulation x 1 lap around nurses station without AD, taking 1 standing rest break 2/2 SOB. At this time, pt does not require acute PT services but would benefit from walking throughout the day with nursing staff & mobility specialists.  Pt received & left on 1.5 L/min via nasal cannula Pt ambulated on 2L O2 but increased to 3L/min as SpO2 dropped to 79% -- pt only endorsed mild SOB & pt able to recover with increased O2 & pursed lip breathing. Pt did request to use his pulsing portable O2 tank vs continuous one from hospital during gait.      Recommendations for follow up therapy are one component of a multi-disciplinary discharge planning process, led by the attending physician.  Recommendations may be updated based on patient status, additional functional criteria and insurance authorization.  Follow Up Recommendations       Assistance Recommended at Discharge PRN  Patient can return home with the following       Equipment Recommendations None recommended by PT  Recommendations for Other Services       Functional Status Assessment Patient has not had a recent decline in their functional status     Precautions / Restrictions Precautions Precautions: Fall Restrictions Weight Bearing Restrictions:  No      Mobility  Bed Mobility Overal bed mobility: Modified Independent                  Transfers Overall transfer level: Independent Equipment used: None                    Ambulation/Gait Ambulation/Gait assistance: Independent Gait Distance (Feet): 150 Feet Assistive device: None Gait Pattern/deviations: WFL(Within Functional Limits) Gait velocity: slightly decreased     General Gait Details: 1 standing rest break 2/2 SOB  Stairs            Wheelchair Mobility    Modified Rankin (Stroke Patients Only)       Balance Overall balance assessment: Needs assistance Sitting-balance support: Feet supported Sitting balance-Leahy Scale: Normal     Standing balance support: During functional activity, No upper extremity supported Standing balance-Leahy Scale: Good                               Pertinent Vitals/Pain Pain Assessment Pain Assessment: No/denies pain    Home Living Family/patient expects to be discharged to:: Private residence Living Arrangements: Spouse/significant other Available Help at Discharge: Family Type of Home: House Home Access: Stairs to enter Entrance Stairs-Rails: None Entrance Stairs-Number of Steps: 2-3       Additional Comments: Vent support machine at night; 2 L O2 baseline during day (nasal cannula)    Prior Function Prior Level of Function : Independent/Modified Independent  Hand Dominance        Extremity/Trunk Assessment   Upper Extremity Assessment Upper Extremity Assessment: Overall WFL for tasks assessed    Lower Extremity Assessment Lower Extremity Assessment: Overall WFL for tasks assessed       Communication   Communication: Passy-Muir valve  Cognition Arousal/Alertness: Awake/alert Behavior During Therapy: WFL for tasks assessed/performed Overall Cognitive Status: Within Functional Limits for tasks assessed                                           General Comments      Exercises     Assessment/Plan    PT Assessment Patient does not need any further PT services  PT Problem List         PT Treatment Interventions      PT Goals (Current goals can be found in the Care Plan section)  Acute Rehab PT Goals Patient Stated Goal: get better, go home PT Goal Formulation: With patient Time For Goal Achievement: 05/18/22 Potential to Achieve Goals: Good    Frequency       Co-evaluation               AM-PAC PT "6 Clicks" Mobility  Outcome Measure Help needed turning from your back to your side while in a flat bed without using bedrails?: None Help needed moving from lying on your back to sitting on the side of a flat bed without using bedrails?: None Help needed moving to and from a bed to a chair (including a wheelchair)?: None Help needed standing up from a chair using your arms (e.g., wheelchair or bedside chair)?: None Help needed to walk in hospital room?: None Help needed climbing 3-5 steps with a railing? : None 6 Click Score: 24    End of Session Equipment Utilized During Treatment: Oxygen Activity Tolerance: Patient tolerated treatment well Patient left: in bed;with call bell/phone within reach Nurse Communication: Mobility status (O2)      Time: [id number] PT Time Calculation (min) (ACUTE ONLY): 15 min   Charges:   PT Evaluation $PT Eval Moderate Complexity: 1 Mod          Lavone Nian, PT, DPT 05/04/22, 1:29 PM   [name] 05/04/2022, 1:28 PM

## 2022-05-04 NOTE — Progress Notes (Signed)
PROGRESS NOTE    [name]  [id number] DOB: [birth year] DOA: 05/03/2022 PCP: [facility]     Brief Narrative:   From admission h and p  [name] is a 52 y.o. 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  Patient reports approximately 4 to 5 days of increased work of breathing, cough, malaise.  No reported sick contacts.  Has been using home O2 at 2 L.  Sometimes increased to 3.  No chest pain.  No abdominal pain, nausea or vomiting.  No hemiparesis or confusion.  Has been using home inhalers with minimal improvement in symptoms.   Assessment & Plan:   Principal Problem:   Influenzal bronchitis Active Problems:   Acute on chronic respiratory failure with hypoxia (HCC)   OSA and COPD overlap syndrome (HCC)   Type 2 diabetes mellitus with hyperlipidemia (HCC)   HTN (hypertension)   Chronic diastolic heart failure (HCC)   COPD with acute exacerbation (HCC)   CKD stage 3 due to type 2 diabetes mellitus (HCC)   Sarcoidosis, lung (HCC)   Obstructive sleep apnea syndrome   Tracheostomy status (HCC)   AF (paroxysmal atrial fibrillation) (HCC)   Obesity (BMI 35.0-39.9 without comorbidity)   Elevated troponin  * Influenzal bronchitis + cough, wheezing increased WOB in setting of baseline COPD and chronic resp failure on 2L w/ 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 Will order pt eval.   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  + cough, increased WOB, wheezing x 4-5 days On 2L chronically w/ transition to O2 through vent at night via trach  O2 demands have been relatively stable but complains of significant dyspnea as above CXR relatively unchanged in comparison to prior  Blood gas relatively stable  IV solumedrol>prednisone Duonebs  Hold abx as denies significant sputum production Cont tamiflu Cont O2 and home vent  Reassess w/ pulm eval as clinically appropriate    OSA and COPD overlap syndrome (HCC) Active COPD exacerbation w/ influenza B treatment  Resp fairly stable at present though w/ increased risk of decompensation given baseline lung disease and chronic resp failure  Cont home O2 and vent via trach at night     Elevated troponin Trop in 30s on presentation  No active CP  Suspect miminal heart strain w/ decompensated resp failure w/ influenza B Looks to be chronic  On ASA  Monitor    AF (paroxysmal atrial fibrillation) (HCC) EKG NSR w/ rate in the 90s Cont home BB and eliquis  Follow    CKD stage 3 due to type 2 diabetes mellitus (HCC) Baseline Cr 1.3-1.6  Cr today 1.6  Monitor  Minimize nephrotoxins    Chronic diastolic heart failure (HCC) 2D ECHO 02/2022 w/ EF 60-65% and grade 1 diastolic dysfunction  Euvolemic  Cont home regimen entresto, spiro, metop   HTN (hypertension) BP stable  Titrate home regimen    DVT prophylaxis:  home apixaban Code Status: full Family Communication: none @ bedside  Level of care: Progressive Status is: Inpatient Remains inpatient appropriate because: severity of illness    Consultants:  none  Procedures: none  Antimicrobials:  S/p doxy    Subjective: Reports ongoing dyspnea, somewhat improved  Objective: Vitals:   05/04/22 0041 05/04/22 0210 05/04/22 0509 05/04/22 0840  BP: 105/81   122/82 126/84  Pulse: 70  77 87  Resp: 18  20 18   Temp: 98.1 F (36.7 C)  97.8 F (36.6 C) 98.7 F (37.1 C)  TempSrc:      SpO2: 99% 93% 94% (!) 82%  Weight:   112.8 kg   Height:        Intake/Output Summary (Last 24 hours) at 05/04/2022 0849 Last data filed at 05/04/2022 0733 Gross per 24 hour  Intake 443.53 ml  Output 600 ml  Net -156.47 ml   Filed Weights   05/03/22 0433 05/03/22 1931 05/04/22 0509  Weight: 113.9 kg 111.5 kg 112.8 kg    Examination:  General exam: Appears calm and comfortable  Respiratory system: mild tachypnea, exp wheeze Cardiovascular system: S1 & S2 heard, RRR. No JVD, murmurs, rubs, gallops or clicks. No pedal edema. Gastrointestinal system: Abdomen is nondistended, soft and nontender. No organomegaly or masses felt. Normal bowel sounds heard. Central nervous system: Alert and oriented. No focal neurological deficits. Extremities: Symmetric 5 x 5 power. Skin: No rashes, lesions or ulcers Psychiatry: Judgement and insight appear normal. Mood & affect appropriate.     Data Reviewed: I have personally reviewed following labs and imaging studies  CBC: Recent Labs  Lab 05/03/22 0452 05/04/22 0619  WBC 4.0 4.5  HGB 13.8 13.7  HCT 43.3 42.2  MCV 87.5 84.4  PLT 149* [id number]   Basic Metabolic Panel: Recent Labs  Lab 05/03/22 0452 05/04/22 0619  NA 137 138  K 4.2 4.9  CL 106 106  CO2 22 25  GLUCOSE 106* 142*  BUN 23* 29*  CREATININE 1.64* 1.51*  CALCIUM 8.4* 8.4*   GFR: Estimated Creatinine Clearance: 75.4 mL/min (A) (by C-G formula based on SCr of 1.51 mg/dL (H)). Liver Function Tests: Recent Labs  Lab 05/04/22 0619  AST 24  ALT 25  ALKPHOS 65  BILITOT 0.7  PROT 7.1  ALBUMIN 3.5   No results for input(s): "LIPASE", "AMYLASE" in the last 168 hours. No results for input(s): "AMMONIA" in the last 168 hours. Coagulation Profile: No results for input(s): "INR", "PROTIME" in the last 168 hours. Cardiac Enzymes: No results for  input(s): "CKTOTAL", "CKMB", "CKMBINDEX", "TROPONINI" in the last 168 hours. BNP (last 3 results) No results for input(s): "PROBNP" in the last 8760 hours. HbA1C: No results for input(s): "HGBA1C" in the last 72 hours. CBG: Recent Labs  Lab 05/03/22 0813 05/03/22 1207 05/03/22 1621 05/03/22 1941 05/04/22 0837  GLUCAP 133* 165* 107* 99 174*   Lipid Profile: No results for input(s): "CHOL", "HDL", "LDLCALC", "TRIG", "CHOLHDL", "LDLDIRECT" in the last 72 hours. Thyroid Function Tests: No results for input(s): "TSH", "T4TOTAL", "FREET4", "T3FREE", "THYROIDAB" in the last 72 hours. Anemia Panel: No results for input(s): "VITAMINB12", "FOLATE", "FERRITIN", "TIBC", "IRON", "RETICCTPCT" in the last 72 hours. Urine analysis:    Component Value Date/Time   COLORURINE YELLOW (A) 01/21/2022 0824   APPEARANCEUR CLEAR (A) 01/21/2022 0824   APPEARANCEUR Hazy 06/01/2013 1139   LABSPEC 1.021 01/21/2022 0824   LABSPEC 1.030 06/01/2013 1139   PHURINE 5.0 01/21/2022 0824   GLUCOSEU >=500 (A) 01/21/2022 [id number]  GLUCOSEU Negative 06/01/2013 [place] 01/21/2022 0824   BILIRUBINUR NEGATIVE 01/21/2022 0824   BILIRUBINUR Negative 06/01/2013 [place] 01/21/2022 0824   PROTEINUR 30 (A) 01/21/2022 0824   NITRITE NEGATIVE 01/21/2022 0824   LEUKOCYTESUR NEGATIVE 01/21/2022 0824   LEUKOCYTESUR Negative 06/01/2013 1139   Sepsis Labs: @LABRCNTIP (procalcitonin:4,lacticidven:4)  ) Recent Results (from the past 240 hour(s))  Resp panel by RT-PCR (RSV, Flu A&B, Covid) Anterior Nasal Swab     Status: Abnormal   Collection Time: 05/03/22  4:52 AM   Specimen: Anterior Nasal Swab  Result Value Ref Range Status   SARS Coronavirus 2 by RT PCR NEGATIVE NEGATIVE Final    Comment: (NOTE) SARS-CoV-2 target nucleic acids are NOT DETECTED.  The SARS-CoV-2 RNA is generally detectable in upper respiratory specimens during the acute phase of infection. The lowest concentration of  SARS-CoV-2 viral copies this assay can detect is 138 copies/mL. A negative result does not preclude SARS-Cov-2 infection and should not be used as the sole basis for treatment or other patient management decisions. A negative result may occur with  improper specimen collection/handling, submission of specimen other than nasopharyngeal swab, presence of viral mutation(s) within the areas targeted by this assay, and inadequate number of viral copies(<138 copies/mL). A negative result must be combined with clinical observations, patient history, and epidemiological information. The expected result is Negative.  Fact Sheet for Patients:  EntrepreneurPulse.com.au  Fact Sheet for Healthcare Providers:  IncredibleEmployment.be  This test is no t yet approved or cleared by the [place] FDA and  has been authorized for detection and/or diagnosis of SARS-CoV-2 by FDA under an Emergency Use Authorization (EUA). This EUA will remain  in effect (meaning this test can be used) for the duration of the COVID-19 declaration under Section 564(b)(1) of the Act, 21 U.S.C.section 360bbb-3(b)(1), unless the authorization is terminated  or revoked sooner.       Influenza A by PCR NEGATIVE NEGATIVE Final   Influenza B by PCR POSITIVE (A) NEGATIVE Final    Comment: (NOTE) The Xpert Xpress SARS-CoV-2/FLU/RSV plus assay is intended as an aid in the diagnosis of influenza from Nasopharyngeal swab specimens and should not be used as a sole basis for treatment. Nasal washings and aspirates are unacceptable for Xpert Xpress SARS-CoV-2/FLU/RSV testing.  Fact Sheet for Patients: EntrepreneurPulse.com.au  Fact Sheet for Healthcare Providers: IncredibleEmployment.be  This test is not yet approved or cleared by the [place] FDA and has been authorized for detection and/or diagnosis of SARS-CoV-2 by FDA under an Emergency Use  Authorization (EUA). This EUA will remain in effect (meaning this test can be used) for the duration of the COVID-19 declaration under Section 564(b)(1) of the Act, 21 U.S.C. section 360bbb-3(b)(1), unless the authorization is terminated or revoked.     Resp Syncytial Virus by PCR NEGATIVE NEGATIVE Final    Comment: (NOTE) Fact Sheet for Patients: EntrepreneurPulse.com.au  Fact Sheet for Healthcare Providers: IncredibleEmployment.be  This test is not yet approved or cleared by the [place] FDA and has been authorized for detection and/or diagnosis of SARS-CoV-2 by FDA under an Emergency Use Authorization (EUA). This EUA will remain in effect (meaning this test can be used) for the duration of the COVID-19 declaration under Section 564(b)(1) of the Act, 21 U.S.C. section 360bbb-3(b)(1), unless the authorization is terminated or revoked.  Performed at [facility], [address] [place], [place] [address]          Radiology Studies: DG Chest  Portable 1 View  Result Date: 05/03/2022 CLINICAL DATA:  Chest pain EXAM: PORTABLE CHEST 1 VIEW COMPARISON:  01/28/2022 FINDINGS: Asymmetric elevation left hemidiaphragm with similar left base collapse/consolidation and tiny effusion. Probable tiny right pleural effusion. Bandlike opacity in the infrahilar right lung is stable consistent with chronic atelectasis or scarring. Scattered additional areas of architectural distortion and scarring are noted bilaterally. Tracheostomy tube remains in place. No pulmonary edema. Telemetry leads overlie the chest. IMPRESSION: 1. No substantial change. Left base collapse/consolidation with tiny bilateral effusions. 2. Scattered areas of architectural distortion/scarring bilaterally. Electronically Signed   By: Misty Stanley M.D.   On: 05/03/2022 05:03        Scheduled Meds:  apixaban  5 mg Oral BID   aspirin EC  81 mg Oral Daily   atorvastatin  40  mg Oral QHS   insulin aspart  0-20 Units Subcutaneous TID WC   insulin aspart  0-5 Units Subcutaneous QHS   insulin aspart  6 Units Subcutaneous TID WC   ipratropium-albuterol  3 mL Nebulization Q6H   melatonin  5 mg Oral QHS   metoprolol succinate  100 mg Oral Daily   oseltamivir  75 mg Oral BID   [START ON 05/06/2022] predniSONE  40 mg Oral Q breakfast   sacubitril-valsartan  1 tablet Oral BID   spironolactone  25 mg Oral Daily   theophylline  300 mg Oral BID   tiotropium  18 mcg Inhalation Daily   Continuous Infusions:  sodium chloride 10 mL/hr at 05/03/22 0803   doxycycline (VIBRAMYCIN) IV Stopped (05/03/22 2344)     LOS: 1 day     Desma Maxim, MD [facility]   If 7PM-7AM, please contact night-coverage [email or web address] Password [facility] 05/04/2022, 8:49 AM

## 2022-05-05 DIAGNOSIS — J111 Influenza due to unidentified influenza virus with other respiratory manifestations: Secondary | ICD-10-CM | POA: Diagnosis not present

## 2022-05-05 LAB — GLUCOSE, CAPILLARY
Glucose-Capillary: 130 mg/dL — ABNORMAL HIGH (ref 70–99)
Glucose-Capillary: 92 mg/dL (ref 70–99)
Glucose-Capillary: 93 mg/dL (ref 70–99)
Glucose-Capillary: 108 mg/dL — ABNORMAL HIGH (ref 70–99)

## 2022-05-05 NOTE — Progress Notes (Signed)
9SATURATION QUALIFICATIONS: (This note is used to comply with regulatory documentation for home oxygen)  Patient Saturations on Room Air at Rest = 96%  Patient Saturations on Room Air while Ambulating = 82%  Patient Saturations on 4 Liters of oxygen while Ambulating = 91%  Please briefly explain why patient needs home oxygen:  Attempted to walk patient in 2 litres, got Spo2 upto 84%, attempted in 3 litres, got 87%, ended on 4 litres.

## 2022-05-05 NOTE — Progress Notes (Signed)
Pt manages & initiates his home ventilator at bedtime independently

## 2022-05-05 NOTE — Progress Notes (Signed)
Patient's ambulatory SPO2 was monitored, after attempts on room air, 2 litres, 3 litres and 4 litres, ended up at 4litres to be 91%, MD made aware of it. MD said patient can't be discharged yet, patient informed of not getting discharged, patient wanted to talk to doctor, doctor was informed.

## 2022-05-05 NOTE — Progress Notes (Signed)
PROGRESS NOTE    [name]  [id number]  DOB: 09-03-1970  DOA: 05/03/2022 PCP: [facility]:   Hospital course:  52 year old with chronic respiratory failure on 2 L home O2 but uses vent support at night via his tracheostomy, also history of sarcoidosis, A-fib, HFpEF and OSA was admitted with acute on chronic hypoxic respiratory failure secondary to influenza B.  Chest x-ray showed collapsed left base which is without changes since at least December 2023.  Subjective:  Patient states he feels much better than when he came in.  Would like to go home.  Thinks he is almost close to baseline.   Objective: Vitals:   05/05/22 0405 05/05/22 0820 05/05/22 0841 05/05/22 1254  BP: 96/75  97/62 111/76  Pulse: 70  80 72  Resp: 18  18   Temp: 97.9 F (36.6 C)  97.7 F (36.5 C) 97.7 F (36.5 C)  TempSrc:   Oral Oral  SpO2: 99% 94% 98%   Weight:      Height:        Intake/Output Summary (Last 24 hours) at 05/05/2022 1315 Last data filed at 05/04/2022 1522 Gross per 24 hour  Intake 35.53 ml  Output --  Net 35.53 ml   Filed Weights   05/03/22 0433 05/03/22 1931 05/04/22 0509  Weight: 113.9 kg 111.5 kg 112.8 kg     Exam:  General: Comfortable appearing patient lying flat in bed with 2 L O2 via [place] in NAD.  Able to speak in full sentences without difficulty. Eyes: sclera anicteric, conjuctiva mild injection bilaterally CVS: S1-S2, regular  Respiratory: Reasonable air entry bilaterally with rales at left base GI: NABS, soft, NT  LE: Warm and well-perfused Neuro: A/O x 3,  grossly nonfocal.  Psych: patient is logical and coherent, judgement and insight appear normal, mood and affect appropriate to situation.  Data Reviewed:  Basic Metabolic Panel: Recent Labs  Lab 05/03/22 0452 05/04/22 0619  NA 137 138  K 4.2 4.9  CL 106 106  CO2 22 25  GLUCOSE 106* 142*  BUN 23* 29*  CREATININE 1.64* 1.51*  CALCIUM 8.4* 8.4*     CBC: Recent Labs  Lab 05/03/22 0452 05/04/22 0619  WBC 4.0 4.5  HGB 13.8 13.7  HCT 43.3 42.2  MCV 87.5 84.4  PLT 149* 159     Scheduled Meds:  apixaban  5 mg Oral BID   aspirin EC  81 mg Oral Daily   atorvastatin  40 mg Oral QHS   insulin aspart  0-20 Units Subcutaneous TID WC   insulin aspart  0-5 Units Subcutaneous QHS   insulin aspart  6 Units Subcutaneous TID WC   ipratropium-albuterol  3 mL Nebulization Q6H   melatonin  5 mg Oral QHS   metoprolol succinate  100 mg Oral Daily   oseltamivir  75 mg Oral BID   [START ON 05/06/2022] predniSONE  40 mg Oral Q breakfast   sacubitril-valsartan  1 tablet Oral BID   spironolactone  25 mg Oral Daily   theophylline  300 mg Oral BID   tiotropium  18 mcg Inhalation Daily   Continuous Infusions:  sodium chloride 10 mL/hr at 05/03/22 0803     Assessment & Plan:   Acute on chronic respiratory failure with hypoxia Patient states he is clinically improved Per RN note with walking patient--he required 4 L of oxygen to maintain O2 sats greater than 90% I will keep him for another day and see how  he does, consider discharging on increased doses of oxygen as long as his work of breathing is not too bad.  Influenza B in patient with COPD/OSA Patient is improving on Tamiflu Continue prednisone inhaled bronchodilators Continue home vent protocol at night  DM 2 Blood sugars reasonably controlled on present regimen  HTN Continue home doses of Toprol-XL, spironolactone and Entresto  CKD 3 Creatinine at baseline is 1.3 up to 1.6 Patient is at baseline  HFpEF Echo in January 2024 with a EF 60 to 65% Other than what appears to be chronic small pleural effusions on chest x-ray, patient appears to be euvolemic.  Continue home doses of Entresto, spironolactone and Toprol-XL  Atrial fibrillation Rate controlled on Toprol-XL Secondary stroke prevention with Eliquis  Elevated troponin  Stable around 30, no chest pain, BNP  WNL Continue ASA   DVT prophylaxis: Eliquis Code Status: Full   Studies: No results found.  Principal Problem:   Influenzal bronchitis Active Problems:   Acute on chronic respiratory failure with hypoxia (HCC)   OSA and COPD overlap syndrome (HCC)   Type 2 diabetes mellitus with hyperlipidemia (HCC)   HTN (hypertension)   Chronic diastolic heart failure (HCC)   COPD with acute exacerbation (HCC)   CKD stage 3 due to type 2 diabetes mellitus ([place])   Sarcoidosis, lung ([place])   Obstructive sleep apnea syndrome   Tracheostomy status (HCC)   AF (paroxysmal atrial fibrillation) ([place])   Obesity (BMI 35.0-39.9 without comorbidity)   Elevated troponin     Oree Mirelez Tublu Tyna Huertas, [facility]  If 7PM-7AM, please contact night-coverage [email or web address]   LOS: 2 days

## 2022-05-05 NOTE — Progress Notes (Signed)
Pt uses his own home ventilator at night which he initiates and manages himself

## 2022-05-06 DIAGNOSIS — J111 Influenza due to unidentified influenza virus with other respiratory manifestations: Secondary | ICD-10-CM | POA: Diagnosis not present

## 2022-05-06 LAB — GLUCOSE, CAPILLARY: Glucose-Capillary: 93 mg/dL (ref 70–99)

## 2022-05-06 MED ORDER — OSELTAMIVIR PHOSPHATE 75 MG PO CAPS: 75.0000 mg | ORAL_CAPSULE | Freq: Two times a day (BID) | ORAL | 0 refills | Status: AC

## 2022-05-06 MED ORDER — IPRATROPIUM-ALBUTEROL 0.5-2.5 (3) MG/3ML IN SOLN
3.0000 mL | RESPIRATORY_TRACT | Status: AC
  Administered 2022-05-06: 3 mL via RESPIRATORY_TRACT
  Filled 2022-05-06: qty 3

## 2022-05-06 MED ORDER — IPRATROPIUM-ALBUTEROL 0.5-2.5 (3) MG/3ML IN SOLN
3.0000 mL | Freq: Three times a day (TID) | RESPIRATORY_TRACT | Status: DC
  Administered 2022-05-06: 3 mL via RESPIRATORY_TRACT
  Filled 2022-05-06: qty 3

## 2022-05-06 MED ORDER — PREDNISONE 20 MG PO TABS: 40.0000 mg | ORAL_TABLET | Freq: Every day | ORAL | 0 refills | Status: AC

## 2022-05-06 NOTE — Discharge Summary (Signed)
[name] [id number] DOB: 01/08/1971 DOA: 05/03/2022  PCP: [place] date: 05/03/2022  Discharge date: 05/06/2022  Admitted From: Home   disposition: Home   Recommendations for Outpatient Follow-up:   Follow up with PCP in 1-2 weeks   Home Health: N/A Equipment/Devices: Patient already has oxygen at home Consultations: None Discharge Condition: Improved CODE STATUS: Full Diet Recommendation: Heart Healthy low-sodium  Diet Order             Diet - low sodium heart healthy           Diet heart healthy/carb modified Room service appropriate? Yes; Fluid consistency: Thin  Diet effective now                    Chief Complaint  Patient presents with   Chest Pain     Brief history of present illness from the day of admission and additional interim summary    [name] is a 52 y.o. 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  Patient reports approximately 4 to 5 days of increased work of breathing, cough, malaise.  No reported sick contacts.  Has been using home O2 at 2 L.  Sometimes increased to 3.  No chest pain.  No abdominal pain, nausea or vomiting.  No hemiparesis or confusion.  Has been using home inhalers with minimal improvement in symptoms. Presented to the ER afebrile, hemodynamically stable.  Required 2-3 L nasal cannula to keep O2 sats greater than 91%.  White count 4, hemoglobin 13.8, platelets  149, troponin 32, creatinine 1.64, chest x-ray grossly unchanged with noted chronic L base collapse/consolidation. Influenza B+.  BNP was noted to be 41 and patient was admitted for treatment of acute on chronic hypoxic respiratory failure secondary to influenza B causing acute exacerbation of COPD.                                                                  Hospital Course   Patient was treated with Tamiflu, prednisone and inhaled bronchodilators.  Patient's CHF medications were continued without change.  Patient felt much improved with treatment and was requesting to go home however still had increased oxygen requirement.  He was observed for 1 more day and on day of discharge he was able to ambulate on 4 L of home O2 without difficulty.  Patient was discharged to complete his course of Tamiflu and prednisone.  Patient is to follow-up with his PCP in 1  week to assess oxygen requirement needs and whether he is back down to 2 L home O2.   * Influenzal bronchitis + cough, wheezing increased WOB in setting of baseline COPD and chronic resp failure on 2L w/ 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 Will order pt eval.   Acute on chronic respiratory failure with hypoxia (HCC) Decompensated chronic respiratory failure in setting of influenza B and COPD with chronic trach in place  + cough, increased WOB, wheezing x 4-5 days On 2L chronically w/ transition to O2 through vent at night via trach  O2 demands have been relatively stable but complains of significant dyspnea as above CXR relatively unchanged in comparison to prior  Blood gas relatively stable  IV solumedrol>prednisone Duonebs  Hold abx as denies significant sputum production Cont tamiflu Cont O2 and home vent  Reassess w/ pulm eval as clinically  appropriate    OSA and COPD overlap syndrome (HCC) Active COPD exacerbation w/ influenza B treatment  Resp fairly stable at present though w/ increased risk of decompensation given baseline lung disease and chronic resp failure  Cont home O2 and vent via trach at night      Elevated troponin Trop in 30s on presentation  No active CP  Suspect miminal heart strain w/ decompensated resp failure w/ influenza B Looks to be chronic  On ASA  Monitor    AF (paroxysmal atrial fibrillation) (HCC) EKG NSR w/ rate in the 90s Cont home BB and eliquis  Follow    CKD stage 3 due to type 2 diabetes mellitus (HCC) Baseline Cr 1.3-1.6  Cr today 1.6  Monitor  Minimize nephrotoxins    Chronic diastolic heart failure (HCC) 2D ECHO 02/2022 w/ EF 60-65% and grade 1 diastolic dysfunction  Euvolemic  Cont home regimen entresto, spiro, metop   HTN (hypertension) BP stable  Titrate home regimen         Finish taking your Tamiflu and your prednisone as prescribed Titrate your oxygen down as tolerated Keep your O2 saturations higher than 92%   Discharge diagnosis     Principal Problem:   Influenzal bronchitis Active Problems:   Acute on chronic respiratory failure with hypoxia (HCC)   OSA and COPD overlap syndrome (HCC)   Type 2 diabetes mellitus with hyperlipidemia (HCC)   HTN (hypertension)   Chronic diastolic heart failure (HCC)   COPD with acute exacerbation (HCC)   CKD stage 3 due to type 2 diabetes mellitus ([place])   Sarcoidosis, lung (HCC)   Obstructive sleep apnea syndrome   Tracheostomy status (HCC)   AF (paroxysmal atrial fibrillation) (HCC)   Obesity (BMI 35.0-39.9 without comorbidity)   Elevated troponin    Discharge instructions    Discharge Instructions     Diet - low sodium heart healthy   Complete by: As directed    Discharge instructions   Complete by: As directed    1.  Wear a mask until you are no longer coughing especially around people who are elderly,  young or sick. 2.  Finish taking your Tamiflu and your prednisone as prescribed. 3.  Check in with your PCP in 1 week to make sure you are still doing well.   Increase activity slowly   Complete by: As directed        Discharge Medications   Allergies as  of 05/06/2022       Reactions   Penicillins Anaphylaxis, Other (See Comments)   Has patient had a PCN reaction causing immediate rash, facial/tongue/throat swelling, SOB or lightheadedness with hypotension: Yes Has patient had a PCN reaction causing severe rash involving mucus membranes or skin necrosis: No Has patient had a PCN reaction that required hospitalization: No Has patient had a PCN reaction occurring within the last 10 years: No If all of the above answers are "NO", then may proceed with Cephalosporin use.        Medication List     TAKE these medications    aspirin EC 81 MG tablet Take 81 mg by mouth daily. Swallow whole.   atorvastatin 40 MG tablet Commonly known as: LIPITOR TAKE 1 TABLET BY MOUTH AT BEDTIME   benzonatate 100 MG capsule Commonly known as: Tessalon Perles Take 2 capsules (200 mg total) by mouth 3 (three) times daily as needed for cough.   Eliquis 5 MG Tabs tablet Generic drug: apixaban Take 5 mg by mouth 2 (two) times daily.   empagliflozin 10 MG Tabs tablet Commonly known as: JARDIANCE Take 1 tablet (10 mg total) by mouth daily before breakfast.   fluticasone-salmeterol 250-50 MCG/ACT Aepb Commonly known as: ADVAIR Inhale 1 puff into the lungs every 12 (twelve) hours.   furosemide 40 MG tablet Commonly known as: LASIX Take 1 tablet (40 mg total) by mouth daily. What changed:  when to take this reasons to take this   guaiFENesin-dextromethorphan 100-10 MG/5ML syrup Commonly known as: ROBITUSSIN DM Take 10 mLs by mouth every 4 (four) hours as needed for cough.   ipratropium-albuterol 0.5-2.5 (3) MG/3ML Soln Commonly known as: DUONEB Take 3 mLs by nebulization every 4 (four)  hours as needed.   melatonin 5 MG Tabs Take 5 mg by mouth at bedtime.   metoprolol succinate 100 MG 24 hr tablet Commonly known as: TOPROL-XL Take 1 tablet (100 mg total) by mouth daily. Take with or immediately following a meal.   Muscle Rub 10-15 % Crea Apply 1 Application topically as needed for muscle pain.   nitroGLYCERIN 0.4 MG SL tablet Commonly known as: NITROSTAT Place 0.4 mg under the tongue every 5 (five) minutes as needed.   NovoLOG FlexPen 100 UNIT/ML FlexPen Generic drug: insulin aspart Inject 0-16 Units into the skin 3 (three) times daily with meals. Based on SSI   oseltamivir 75 MG capsule Commonly known as: TAMIFLU Take 1 capsule (75 mg total) by mouth 2 (two) times daily for 2 doses.   Potassium Chloride ER 20 MEQ Tbcr Take 20 mEq by mouth as needed (if takes furosemide).   predniSONE 20 MG tablet Commonly known as: DELTASONE Take 2 tablets (40 mg total) by mouth daily with breakfast for 2 days. Start taking on: May 07, 2022   sacubitril-valsartan 24-26 MG Commonly known as: ENTRESTO Take 1 tablet by mouth 2 (two) times daily.   sildenafil 25 MG tablet Commonly known as: VIAGRA Take 25 mg by mouth daily as needed for erectile dysfunction.   spironolactone 25 MG tablet Commonly known as: ALDACTONE Take 1 tablet (25 mg total) by mouth daily.   theophylline 300 MG 12 hr tablet Commonly known as: THEODUR Take 300 mg by mouth 2 (two) times daily.   tiotropium 18 MCG inhalation capsule Commonly known as: SPIRIVA Place 18 mcg into inhaler and inhale daily.   Ventolin HFA 108 (90 Base) MCG/ACT inhaler Generic drug: albuterol Inhale 2 puffs into the lungs every 6 (  six) hours as needed for wheezing or shortness of breath.          Major procedures and Radiology Reports - PLEASE review detailed and final reports thoroughly  -      DG Chest Portable 1 View  Result Date: 05/03/2022 CLINICAL DATA:  Chest pain EXAM: PORTABLE CHEST 1 VIEW  COMPARISON:  01/28/2022 FINDINGS: Asymmetric elevation left hemidiaphragm with similar left base collapse/consolidation and tiny effusion. Probable tiny right pleural effusion. Bandlike opacity in the infrahilar right lung is stable consistent with chronic atelectasis or scarring. Scattered additional areas of architectural distortion and scarring are noted bilaterally. Tracheostomy tube remains in place. No pulmonary edema. Telemetry leads overlie the chest. IMPRESSION: 1. No substantial change. Left base collapse/consolidation with tiny bilateral effusions. 2. Scattered areas of architectural distortion/scarring bilaterally. Electronically Signed   By: Misty Stanley M.D.   On: 05/03/2022 05:03    Micro Results    Recent Results (from the past 240 hour(s))  Resp panel by RT-PCR (RSV, Flu A&B, Covid) Anterior Nasal Swab     Status: Abnormal   Collection Time: 05/03/22  4:52 AM   Specimen: Anterior Nasal Swab  Result Value Ref Range Status   SARS Coronavirus 2 by RT PCR NEGATIVE NEGATIVE Final    Comment: (NOTE) SARS-CoV-2 target nucleic acids are NOT DETECTED.  The SARS-CoV-2 RNA is generally detectable in upper respiratory specimens during the acute phase of infection. The lowest concentration of SARS-CoV-2 viral copies this assay can detect is 138 copies/mL. A negative result does not preclude SARS-Cov-2 infection and should not be used as the sole basis for treatment or other patient management decisions. A negative result may occur with  improper specimen collection/handling, submission of specimen other than nasopharyngeal swab, presence of viral mutation(s) within the areas targeted by this assay, and inadequate number of viral copies(<138 copies/mL). A negative result must be combined with clinical observations, patient history, and epidemiological information. The expected result is Negative.  Fact Sheet for Patients:  EntrepreneurPulse.com.au  Fact Sheet for  Healthcare Providers:  IncredibleEmployment.be  This test is no t yet approved or cleared by the [place] FDA and  has been authorized for detection and/or diagnosis of SARS-CoV-2 by FDA under an Emergency Use Authorization (EUA). This EUA will remain  in effect (meaning this test can be used) for the duration of the COVID-19 declaration under Section 564(b)(1) of the Act, 21 U.S.C.section 360bbb-3(b)(1), unless the authorization is terminated  or revoked sooner.       Influenza A by PCR NEGATIVE NEGATIVE Final   Influenza B by PCR POSITIVE (A) NEGATIVE Final    Comment: (NOTE) The Xpert Xpress SARS-CoV-2/FLU/RSV plus assay is intended as an aid in the diagnosis of influenza from Nasopharyngeal swab specimens and should not be used as a sole basis for treatment. Nasal washings and aspirates are unacceptable for Xpert Xpress SARS-CoV-2/FLU/RSV testing.  Fact Sheet for Patients: EntrepreneurPulse.com.au  Fact Sheet for Healthcare Providers: IncredibleEmployment.be  This test is not yet approved or cleared by the [place] FDA and has been authorized for detection and/or diagnosis of SARS-CoV-2 by FDA under an Emergency Use Authorization (EUA). This EUA will remain in effect (meaning this test can be used) for the duration of the COVID-19 declaration under Section 564(b)(1) of the Act, 21 U.S.C. section 360bbb-3(b)(1), unless the authorization is terminated or revoked.     Resp Syncytial Virus by PCR NEGATIVE NEGATIVE Final    Comment: (NOTE) Fact Sheet for Patients: EntrepreneurPulse.com.au  Fact  Sheet for Healthcare Providers: IncredibleEmployment.be  This test is not yet approved or cleared by the [place] and has been authorized for detection and/or diagnosis of SARS-CoV-2 by FDA under an Emergency Use Authorization (EUA). This EUA will remain in effect (meaning  this test can be used) for the duration of the COVID-19 declaration under Section 564(b)(1) of the Act, 21 U.S.C. section 360bbb-3(b)(1), unless the authorization is terminated or revoked.  Performed at [facility], [address] [place], [place] [address]     Today   Subjective    Rigoverto Stonebarger feels much improved since admission.  Feels ready to go home.  Denies chest pain, shortness of breath or abdominal pain.  Feels they can take care of themselves with the resources they have at home.  Objective   Blood pressure 116/76, pulse 71, temperature 98.2 F (36.8 C), temperature source Oral, resp. rate 18, height 6\' 1"  (1.854 m), weight 114.1 kg, SpO2 100 %.   Intake/Output Summary (Last 24 hours) at 05/06/2022 1014 Last data filed at 05/06/2022 0510 Gross per 24 hour  Intake 120 ml  Output 1650 ml  Net -1530 ml    Exam General: Patient appears well and in good spirits sitting up in bed in no acute distress.  Eyes: sclera anicteric, conjuctiva mild injection bilaterally CVS: S1-S2, regular  Respiratory:  decreased air entry bilaterally secondary to decreased inspiratory effort, rales at bases  GI: NABS, soft, NT  LE: No edema.  Neuro: A/O x 3, Moving all extremities equally with normal strength, CN 3-12 intact, grossly nonfocal.  Psych: patient is logical and coherent, judgement and insight appear normal, mood and affect appropriate to situation.    Data Review   CBC w Diff:  Lab Results  Component Value Date   WBC 4.5 05/04/2022   HGB 13.7 05/04/2022   HGB 15.1 05/28/2014   HCT 42.2 05/04/2022   HCT 45.2 05/28/2014   PLT 159 05/04/2022   PLT 185 05/28/2014   LYMPHOPCT 13 01/10/2022   LYMPHOPCT 18.6 05/28/2014   MONOPCT 14 01/10/2022   MONOPCT 15.8 05/28/2014   EOSPCT 0 01/10/2022   EOSPCT 1.3 05/28/2014   BASOPCT 0 01/10/2022   BASOPCT 0.3 05/28/2014    CMP:  Lab Results  Component Value Date   NA 138 05/04/2022   NA 140 05/28/2014   K  4.9 05/04/2022   K 3.9 05/28/2014   CL 106 05/04/2022   CL 105 05/28/2014   CO2 25 05/04/2022   CO2 30 05/28/2014   BUN 29 (H) 05/04/2022   BUN 12 05/28/2014   CREATININE 1.51 (H) 05/04/2022   CREATININE 1.20 05/28/2014   PROT 7.1 05/04/2022   PROT 8.4 (H) 06/01/2013   ALBUMIN 3.5 05/04/2022   ALBUMIN 3.8 06/01/2013   BILITOT 0.7 05/04/2022   BILITOT 0.7 06/01/2013   ALKPHOS 65 05/04/2022   ALKPHOS 88 06/01/2013   AST 24 05/04/2022   AST 24 06/01/2013   ALT 25 05/04/2022   ALT 54 06/01/2013  .   Total Time in preparing paper work, data evaluation and todays exam - 35 minutes  Vashti Hey M.D on 05/06/2022 at 10:14 AM  [facility]

## 2022-06-21 ENCOUNTER — Encounter: Payer: Medicare HMO | Admitting: Family

## 2022-06-28 ENCOUNTER — Encounter: Payer: Medicare HMO | Admitting: Family

## 2022-06-29 ENCOUNTER — Telehealth: Payer: Self-pay | Admitting: Family

## 2022-06-29 ENCOUNTER — Encounter: Payer: Medicare HMO | Admitting: Family

## 2022-06-29 NOTE — Telephone Encounter (Signed)
Patient did not show for his [facility] appointment on 06/29/22.

## 2022-07-11 ENCOUNTER — Other Ambulatory Visit: Payer: Self-pay | Admitting: Specialist

## 2022-07-11 DIAGNOSIS — R634 Abnormal weight loss: Secondary | ICD-10-CM

## 2022-07-20 ENCOUNTER — Ambulatory Visit
Admission: RE | Admit: 2022-07-20 | Discharge: 2022-07-20 | Disposition: A | Discharge status: Home / Self Care | Payer: Medicare HMO | Source: Ambulatory Visit | Attending: Specialist | Admitting: Specialist

## 2022-07-20 DIAGNOSIS — R634 Abnormal weight loss: Secondary | ICD-10-CM | POA: Diagnosis present

## 2022-08-03 ENCOUNTER — Other Ambulatory Visit: Payer: Self-pay | Admitting: Specialist

## 2022-08-03 DIAGNOSIS — R911 Solitary pulmonary nodule: Secondary | ICD-10-CM

## 2022-08-07 ENCOUNTER — Ambulatory Visit
Admission: RE | Admit: 2022-08-07 | Discharge: 2022-08-07 | Disposition: A | Discharge status: Home / Self Care | Payer: Medicare HMO | Source: Ambulatory Visit | Attending: Specialist | Admitting: Specialist

## 2022-08-07 DIAGNOSIS — R911 Solitary pulmonary nodule: Secondary | ICD-10-CM | POA: Diagnosis present

## 2022-10-19 DIAGNOSIS — C641 Malignant neoplasm of right kidney, except renal pelvis: Secondary | ICD-10-CM | POA: Diagnosis not present

## 2022-11-01 DIAGNOSIS — Z9889 Other specified postprocedural states: Secondary | ICD-10-CM | POA: Diagnosis not present

## 2022-11-01 DIAGNOSIS — J449 Chronic obstructive pulmonary disease, unspecified: Secondary | ICD-10-CM | POA: Diagnosis not present

## 2022-11-01 DIAGNOSIS — D860 Sarcoidosis of lung: Secondary | ICD-10-CM | POA: Diagnosis not present

## 2022-11-01 DIAGNOSIS — Z9981 Dependence on supplemental oxygen: Secondary | ICD-10-CM | POA: Diagnosis not present

## 2022-11-01 DIAGNOSIS — G4733 Obstructive sleep apnea (adult) (pediatric): Secondary | ICD-10-CM | POA: Diagnosis not present

## 2022-11-27 DIAGNOSIS — E119 Type 2 diabetes mellitus without complications: Secondary | ICD-10-CM | POA: Diagnosis not present

## 2022-11-27 DIAGNOSIS — Z1331 Encounter for screening for depression: Secondary | ICD-10-CM | POA: Diagnosis not present

## 2022-11-27 DIAGNOSIS — Z0000 Encounter for general adult medical examination without abnormal findings: Secondary | ICD-10-CM | POA: Diagnosis not present

## 2022-11-27 DIAGNOSIS — N1831 Chronic kidney disease, stage 3a: Secondary | ICD-10-CM | POA: Diagnosis not present

## 2022-11-27 DIAGNOSIS — Z0130 Encounter for examination of blood pressure without abnormal findings: Secondary | ICD-10-CM | POA: Diagnosis not present

## 2022-11-27 DIAGNOSIS — R946 Abnormal results of thyroid function studies: Secondary | ICD-10-CM | POA: Diagnosis not present

## 2022-11-27 DIAGNOSIS — Z1389 Encounter for screening for other disorder: Secondary | ICD-10-CM | POA: Diagnosis not present

## 2022-11-27 DIAGNOSIS — Z0131 Encounter for examination of blood pressure with abnormal findings: Secondary | ICD-10-CM | POA: Diagnosis not present

## 2022-11-29 DIAGNOSIS — G4733 Obstructive sleep apnea (adult) (pediatric): Secondary | ICD-10-CM | POA: Diagnosis not present

## 2022-11-29 DIAGNOSIS — Z430 Encounter for attention to tracheostomy: Secondary | ICD-10-CM | POA: Diagnosis not present

## 2023-02-01 DIAGNOSIS — G4733 Obstructive sleep apnea (adult) (pediatric): Secondary | ICD-10-CM | POA: Diagnosis not present

## 2023-02-01 DIAGNOSIS — Z430 Encounter for attention to tracheostomy: Secondary | ICD-10-CM | POA: Diagnosis not present

## 2023-02-26 DIAGNOSIS — I10 Essential (primary) hypertension: Secondary | ICD-10-CM | POA: Diagnosis not present

## 2023-02-26 DIAGNOSIS — C641 Malignant neoplasm of right kidney, except renal pelvis: Secondary | ICD-10-CM | POA: Diagnosis not present

## 2023-02-26 DIAGNOSIS — N2889 Other specified disorders of kidney and ureter: Secondary | ICD-10-CM | POA: Diagnosis not present

## 2023-03-05 DIAGNOSIS — I10 Essential (primary) hypertension: Secondary | ICD-10-CM | POA: Diagnosis not present

## 2023-03-05 DIAGNOSIS — N1831 Chronic kidney disease, stage 3a: Secondary | ICD-10-CM | POA: Diagnosis not present

## 2023-03-05 DIAGNOSIS — F17200 Nicotine dependence, unspecified, uncomplicated: Secondary | ICD-10-CM | POA: Diagnosis not present

## 2023-03-21 DIAGNOSIS — G4733 Obstructive sleep apnea (adult) (pediatric): Secondary | ICD-10-CM | POA: Diagnosis not present

## 2023-03-21 DIAGNOSIS — M25511 Pain in right shoulder: Secondary | ICD-10-CM | POA: Diagnosis not present

## 2023-03-21 DIAGNOSIS — I480 Paroxysmal atrial fibrillation: Secondary | ICD-10-CM | POA: Diagnosis not present

## 2023-03-21 DIAGNOSIS — I5032 Chronic diastolic (congestive) heart failure: Secondary | ICD-10-CM | POA: Diagnosis not present

## 2023-03-21 DIAGNOSIS — R600 Localized edema: Secondary | ICD-10-CM | POA: Diagnosis not present

## 2023-03-21 DIAGNOSIS — I2510 Atherosclerotic heart disease of native coronary artery without angina pectoris: Secondary | ICD-10-CM | POA: Diagnosis not present

## 2023-03-21 DIAGNOSIS — J439 Emphysema, unspecified: Secondary | ICD-10-CM | POA: Diagnosis not present

## 2023-03-21 DIAGNOSIS — I10 Essential (primary) hypertension: Secondary | ICD-10-CM | POA: Diagnosis not present

## 2023-03-21 DIAGNOSIS — Z23 Encounter for immunization: Secondary | ICD-10-CM | POA: Diagnosis not present

## 2023-03-25 DIAGNOSIS — Z6835 Body mass index (BMI) 35.0-35.9, adult: Secondary | ICD-10-CM | POA: Diagnosis not present

## 2023-03-25 DIAGNOSIS — Z794 Long term (current) use of insulin: Secondary | ICD-10-CM | POA: Diagnosis not present

## 2023-03-25 DIAGNOSIS — Z818 Family history of other mental and behavioral disorders: Secondary | ICD-10-CM | POA: Diagnosis not present

## 2023-03-25 DIAGNOSIS — J479 Bronchiectasis, uncomplicated: Secondary | ICD-10-CM | POA: Diagnosis not present

## 2023-03-25 DIAGNOSIS — I509 Heart failure, unspecified: Secondary | ICD-10-CM | POA: Diagnosis not present

## 2023-03-25 DIAGNOSIS — Z7982 Long term (current) use of aspirin: Secondary | ICD-10-CM | POA: Diagnosis not present

## 2023-03-25 DIAGNOSIS — I252 Old myocardial infarction: Secondary | ICD-10-CM | POA: Diagnosis not present

## 2023-03-25 DIAGNOSIS — F17210 Nicotine dependence, cigarettes, uncomplicated: Secondary | ICD-10-CM | POA: Diagnosis not present

## 2023-03-25 DIAGNOSIS — I25119 Atherosclerotic heart disease of native coronary artery with unspecified angina pectoris: Secondary | ICD-10-CM | POA: Diagnosis not present

## 2023-03-25 DIAGNOSIS — Z7901 Long term (current) use of anticoagulants: Secondary | ICD-10-CM | POA: Diagnosis not present

## 2023-03-25 DIAGNOSIS — Z8249 Family history of ischemic heart disease and other diseases of the circulatory system: Secondary | ICD-10-CM | POA: Diagnosis not present

## 2023-03-25 DIAGNOSIS — J439 Emphysema, unspecified: Secondary | ICD-10-CM | POA: Diagnosis not present

## 2023-03-25 DIAGNOSIS — Z930 Tracheostomy status: Secondary | ICD-10-CM | POA: Diagnosis not present

## 2023-03-25 DIAGNOSIS — Z823 Family history of stroke: Secondary | ICD-10-CM | POA: Diagnosis not present

## 2023-03-25 DIAGNOSIS — D6869 Other thrombophilia: Secondary | ICD-10-CM | POA: Diagnosis not present

## 2023-03-25 DIAGNOSIS — F320 Major depressive disorder, single episode, mild: Secondary | ICD-10-CM | POA: Diagnosis not present

## 2023-03-25 DIAGNOSIS — Q828 Other specified congenital malformations of skin: Secondary | ICD-10-CM | POA: Diagnosis not present

## 2023-03-25 DIAGNOSIS — I130 Hypertensive heart and chronic kidney disease with heart failure and stage 1 through stage 4 chronic kidney disease, or unspecified chronic kidney disease: Secondary | ICD-10-CM | POA: Diagnosis not present

## 2023-03-25 DIAGNOSIS — D860 Sarcoidosis of lung: Secondary | ICD-10-CM | POA: Diagnosis not present

## 2023-03-25 DIAGNOSIS — N1831 Chronic kidney disease, stage 3a: Secondary | ICD-10-CM | POA: Diagnosis not present

## 2023-03-25 DIAGNOSIS — G4733 Obstructive sleep apnea (adult) (pediatric): Secondary | ICD-10-CM | POA: Diagnosis not present

## 2023-03-25 DIAGNOSIS — I4891 Unspecified atrial fibrillation: Secondary | ICD-10-CM | POA: Diagnosis not present

## 2023-03-25 DIAGNOSIS — Z7951 Long term (current) use of inhaled steroids: Secondary | ICD-10-CM | POA: Diagnosis not present

## 2023-03-25 DIAGNOSIS — Z8673 Personal history of transient ischemic attack (TIA), and cerebral infarction without residual deficits: Secondary | ICD-10-CM | POA: Diagnosis not present

## 2023-03-25 DIAGNOSIS — M1990 Unspecified osteoarthritis, unspecified site: Secondary | ICD-10-CM | POA: Diagnosis not present

## 2023-03-25 DIAGNOSIS — E785 Hyperlipidemia, unspecified: Secondary | ICD-10-CM | POA: Diagnosis not present

## 2023-03-25 DIAGNOSIS — E876 Hypokalemia: Secondary | ICD-10-CM | POA: Diagnosis not present

## 2023-03-25 DIAGNOSIS — E1151 Type 2 diabetes mellitus with diabetic peripheral angiopathy without gangrene: Secondary | ICD-10-CM | POA: Diagnosis not present

## 2023-04-02 DIAGNOSIS — M7501 Adhesive capsulitis of right shoulder: Secondary | ICD-10-CM | POA: Diagnosis not present

## 2023-04-02 DIAGNOSIS — M25511 Pain in right shoulder: Secondary | ICD-10-CM | POA: Diagnosis not present

## 2023-04-02 DIAGNOSIS — M778 Other enthesopathies, not elsewhere classified: Secondary | ICD-10-CM | POA: Diagnosis not present

## 2023-04-02 DIAGNOSIS — M19011 Primary osteoarthritis, right shoulder: Secondary | ICD-10-CM | POA: Diagnosis not present

## 2023-04-02 DIAGNOSIS — G8929 Other chronic pain: Secondary | ICD-10-CM | POA: Diagnosis not present

## 2023-04-02 DIAGNOSIS — M62838 Other muscle spasm: Secondary | ICD-10-CM | POA: Diagnosis not present

## 2023-04-02 DIAGNOSIS — M7551 Bursitis of right shoulder: Secondary | ICD-10-CM | POA: Diagnosis not present

## 2023-04-02 DIAGNOSIS — M7541 Impingement syndrome of right shoulder: Secondary | ICD-10-CM | POA: Diagnosis not present

## 2023-04-03 DIAGNOSIS — D869 Sarcoidosis, unspecified: Secondary | ICD-10-CM | POA: Diagnosis not present

## 2023-04-03 DIAGNOSIS — J449 Chronic obstructive pulmonary disease, unspecified: Secondary | ICD-10-CM | POA: Diagnosis not present

## 2023-04-03 DIAGNOSIS — Z930 Tracheostomy status: Secondary | ICD-10-CM | POA: Diagnosis not present

## 2023-04-03 DIAGNOSIS — G4733 Obstructive sleep apnea (adult) (pediatric): Secondary | ICD-10-CM | POA: Diagnosis not present

## 2023-04-04 DIAGNOSIS — Z430 Encounter for attention to tracheostomy: Secondary | ICD-10-CM | POA: Diagnosis not present

## 2023-04-04 DIAGNOSIS — G4733 Obstructive sleep apnea (adult) (pediatric): Secondary | ICD-10-CM | POA: Diagnosis not present

## 2023-04-12 DIAGNOSIS — M25511 Pain in right shoulder: Secondary | ICD-10-CM | POA: Diagnosis not present

## 2023-04-16 DIAGNOSIS — M25511 Pain in right shoulder: Secondary | ICD-10-CM | POA: Diagnosis not present

## 2023-04-22 DIAGNOSIS — M25511 Pain in right shoulder: Secondary | ICD-10-CM | POA: Diagnosis not present

## 2023-05-06 DIAGNOSIS — M25511 Pain in right shoulder: Secondary | ICD-10-CM | POA: Diagnosis not present

## 2023-05-14 DIAGNOSIS — M7551 Bursitis of right shoulder: Secondary | ICD-10-CM | POA: Diagnosis not present

## 2023-05-14 DIAGNOSIS — M19011 Primary osteoarthritis, right shoulder: Secondary | ICD-10-CM | POA: Diagnosis not present

## 2023-05-14 DIAGNOSIS — G8929 Other chronic pain: Secondary | ICD-10-CM | POA: Diagnosis not present

## 2023-05-14 DIAGNOSIS — M7501 Adhesive capsulitis of right shoulder: Secondary | ICD-10-CM | POA: Diagnosis not present

## 2023-05-14 DIAGNOSIS — M778 Other enthesopathies, not elsewhere classified: Secondary | ICD-10-CM | POA: Diagnosis not present

## 2023-05-14 DIAGNOSIS — M62838 Other muscle spasm: Secondary | ICD-10-CM | POA: Diagnosis not present

## 2023-05-14 DIAGNOSIS — M25511 Pain in right shoulder: Secondary | ICD-10-CM | POA: Diagnosis not present

## 2023-05-14 DIAGNOSIS — M7541 Impingement syndrome of right shoulder: Secondary | ICD-10-CM | POA: Diagnosis not present

## 2023-05-14 DIAGNOSIS — G5711 Meralgia paresthetica, right lower limb: Secondary | ICD-10-CM | POA: Diagnosis not present

## 2023-05-21 DIAGNOSIS — M25511 Pain in right shoulder: Secondary | ICD-10-CM | POA: Diagnosis not present

## 2023-05-28 DIAGNOSIS — M25511 Pain in right shoulder: Secondary | ICD-10-CM | POA: Diagnosis not present

## 2023-05-30 DIAGNOSIS — Z430 Encounter for attention to tracheostomy: Secondary | ICD-10-CM | POA: Diagnosis not present

## 2023-05-30 DIAGNOSIS — G4733 Obstructive sleep apnea (adult) (pediatric): Secondary | ICD-10-CM | POA: Diagnosis not present

## 2023-06-04 DIAGNOSIS — M25511 Pain in right shoulder: Secondary | ICD-10-CM | POA: Diagnosis not present

## 2023-06-05 DIAGNOSIS — J9621 Acute and chronic respiratory failure with hypoxia: Secondary | ICD-10-CM | POA: Diagnosis not present

## 2023-06-05 DIAGNOSIS — Z1389 Encounter for screening for other disorder: Secondary | ICD-10-CM | POA: Diagnosis not present

## 2023-06-05 DIAGNOSIS — N1831 Chronic kidney disease, stage 3a: Secondary | ICD-10-CM | POA: Diagnosis not present

## 2023-06-05 DIAGNOSIS — G4730 Sleep apnea, unspecified: Secondary | ICD-10-CM | POA: Diagnosis not present

## 2023-06-05 DIAGNOSIS — D649 Anemia, unspecified: Secondary | ICD-10-CM | POA: Diagnosis not present

## 2023-06-05 DIAGNOSIS — Z712 Person consulting for explanation of examination or test findings: Secondary | ICD-10-CM | POA: Diagnosis not present

## 2023-06-05 DIAGNOSIS — Z23 Encounter for immunization: Secondary | ICD-10-CM | POA: Diagnosis not present

## 2023-06-05 DIAGNOSIS — E119 Type 2 diabetes mellitus without complications: Secondary | ICD-10-CM | POA: Diagnosis not present

## 2023-06-05 DIAGNOSIS — J449 Chronic obstructive pulmonary disease, unspecified: Secondary | ICD-10-CM | POA: Diagnosis not present

## 2023-06-05 DIAGNOSIS — Z0130 Encounter for examination of blood pressure without abnormal findings: Secondary | ICD-10-CM | POA: Diagnosis not present

## 2023-06-05 DIAGNOSIS — I10 Essential (primary) hypertension: Secondary | ICD-10-CM | POA: Diagnosis not present

## 2023-06-06 DIAGNOSIS — M25511 Pain in right shoulder: Secondary | ICD-10-CM | POA: Diagnosis not present

## 2023-06-11 DIAGNOSIS — M25511 Pain in right shoulder: Secondary | ICD-10-CM | POA: Diagnosis not present

## 2023-06-13 DIAGNOSIS — M25511 Pain in right shoulder: Secondary | ICD-10-CM | POA: Diagnosis not present

## 2023-06-18 DIAGNOSIS — M25511 Pain in right shoulder: Secondary | ICD-10-CM | POA: Diagnosis not present

## 2023-06-25 DIAGNOSIS — M25511 Pain in right shoulder: Secondary | ICD-10-CM | POA: Diagnosis not present

## 2023-07-03 DIAGNOSIS — M25511 Pain in right shoulder: Secondary | ICD-10-CM | POA: Diagnosis not present

## 2023-07-10 DIAGNOSIS — M25511 Pain in right shoulder: Secondary | ICD-10-CM | POA: Diagnosis not present

## 2023-07-17 DIAGNOSIS — M25511 Pain in right shoulder: Secondary | ICD-10-CM | POA: Diagnosis not present

## 2023-07-31 DIAGNOSIS — G4733 Obstructive sleep apnea (adult) (pediatric): Secondary | ICD-10-CM | POA: Diagnosis not present

## 2023-07-31 DIAGNOSIS — Z430 Encounter for attention to tracheostomy: Secondary | ICD-10-CM | POA: Diagnosis not present

## 2023-08-15 DIAGNOSIS — J449 Chronic obstructive pulmonary disease, unspecified: Secondary | ICD-10-CM | POA: Diagnosis not present

## 2023-08-15 DIAGNOSIS — F17210 Nicotine dependence, cigarettes, uncomplicated: Secondary | ICD-10-CM | POA: Diagnosis not present

## 2023-08-15 DIAGNOSIS — D860 Sarcoidosis of lung: Secondary | ICD-10-CM | POA: Diagnosis not present

## 2023-08-15 DIAGNOSIS — G4733 Obstructive sleep apnea (adult) (pediatric): Secondary | ICD-10-CM | POA: Diagnosis not present

## 2023-08-20 ENCOUNTER — Other Ambulatory Visit: Payer: Self-pay | Admitting: Specialist

## 2023-08-20 DIAGNOSIS — F17210 Nicotine dependence, cigarettes, uncomplicated: Secondary | ICD-10-CM

## 2023-08-20 DIAGNOSIS — D860 Sarcoidosis of lung: Secondary | ICD-10-CM

## 2023-08-29 ENCOUNTER — Ambulatory Visit
Admission: RE | Admit: 2023-08-29 | Discharge: 2023-08-29 | Disposition: A | Discharge status: Home / Self Care | Source: Ambulatory Visit | Attending: Specialist | Admitting: Specialist

## 2023-08-29 DIAGNOSIS — F17210 Nicotine dependence, cigarettes, uncomplicated: Secondary | ICD-10-CM | POA: Diagnosis not present

## 2023-08-29 DIAGNOSIS — D860 Sarcoidosis of lung: Secondary | ICD-10-CM | POA: Insufficient documentation

## 2023-09-17 DIAGNOSIS — N1831 Chronic kidney disease, stage 3a: Secondary | ICD-10-CM | POA: Diagnosis not present

## 2023-09-17 DIAGNOSIS — R801 Persistent proteinuria, unspecified: Secondary | ICD-10-CM | POA: Diagnosis not present

## 2023-09-17 DIAGNOSIS — I10 Essential (primary) hypertension: Secondary | ICD-10-CM | POA: Diagnosis not present

## 2023-09-18 DIAGNOSIS — Z1389 Encounter for screening for other disorder: Secondary | ICD-10-CM | POA: Diagnosis not present

## 2023-09-18 DIAGNOSIS — R918 Other nonspecific abnormal finding of lung field: Secondary | ICD-10-CM | POA: Diagnosis not present

## 2023-09-18 DIAGNOSIS — E119 Type 2 diabetes mellitus without complications: Secondary | ICD-10-CM | POA: Diagnosis not present

## 2023-09-18 DIAGNOSIS — Z712 Person consulting for explanation of examination or test findings: Secondary | ICD-10-CM | POA: Diagnosis not present

## 2023-09-18 DIAGNOSIS — Z0000 Encounter for general adult medical examination without abnormal findings: Secondary | ICD-10-CM | POA: Diagnosis not present

## 2023-09-18 DIAGNOSIS — I10 Essential (primary) hypertension: Secondary | ICD-10-CM | POA: Diagnosis not present

## 2023-09-18 DIAGNOSIS — N1831 Chronic kidney disease, stage 3a: Secondary | ICD-10-CM | POA: Diagnosis not present

## 2023-09-18 DIAGNOSIS — Z0130 Encounter for examination of blood pressure without abnormal findings: Secondary | ICD-10-CM | POA: Diagnosis not present

## 2023-09-25 DIAGNOSIS — M778 Other enthesopathies, not elsewhere classified: Secondary | ICD-10-CM | POA: Diagnosis not present

## 2023-09-25 DIAGNOSIS — B351 Tinea unguium: Secondary | ICD-10-CM | POA: Diagnosis not present

## 2023-09-25 DIAGNOSIS — E119 Type 2 diabetes mellitus without complications: Secondary | ICD-10-CM | POA: Diagnosis not present

## 2023-09-25 DIAGNOSIS — M79674 Pain in right toe(s): Secondary | ICD-10-CM | POA: Diagnosis not present

## 2023-09-25 DIAGNOSIS — L851 Acquired keratosis [keratoderma] palmaris et plantaris: Secondary | ICD-10-CM | POA: Diagnosis not present

## 2023-09-25 DIAGNOSIS — L600 Ingrowing nail: Secondary | ICD-10-CM | POA: Diagnosis not present

## 2023-09-25 DIAGNOSIS — M79675 Pain in left toe(s): Secondary | ICD-10-CM | POA: Diagnosis not present

## 2023-09-26 DIAGNOSIS — G4733 Obstructive sleep apnea (adult) (pediatric): Secondary | ICD-10-CM | POA: Diagnosis not present

## 2023-09-26 DIAGNOSIS — Z430 Encounter for attention to tracheostomy: Secondary | ICD-10-CM | POA: Diagnosis not present

## 2023-10-17 DIAGNOSIS — I10 Essential (primary) hypertension: Secondary | ICD-10-CM | POA: Diagnosis not present

## 2023-10-17 DIAGNOSIS — I480 Paroxysmal atrial fibrillation: Secondary | ICD-10-CM | POA: Diagnosis not present

## 2023-10-17 DIAGNOSIS — D860 Sarcoidosis of lung: Secondary | ICD-10-CM | POA: Diagnosis not present

## 2023-10-17 DIAGNOSIS — I2510 Atherosclerotic heart disease of native coronary artery without angina pectoris: Secondary | ICD-10-CM | POA: Diagnosis not present

## 2023-10-17 DIAGNOSIS — J439 Emphysema, unspecified: Secondary | ICD-10-CM | POA: Diagnosis not present

## 2023-10-23 DIAGNOSIS — Z712 Person consulting for explanation of examination or test findings: Secondary | ICD-10-CM | POA: Diagnosis not present

## 2023-10-23 DIAGNOSIS — Z0130 Encounter for examination of blood pressure without abnormal findings: Secondary | ICD-10-CM | POA: Diagnosis not present

## 2023-10-23 DIAGNOSIS — Z0000 Encounter for general adult medical examination without abnormal findings: Secondary | ICD-10-CM | POA: Diagnosis not present

## 2023-10-23 DIAGNOSIS — Z1389 Encounter for screening for other disorder: Secondary | ICD-10-CM | POA: Diagnosis not present

## 2023-10-23 DIAGNOSIS — E119 Type 2 diabetes mellitus without complications: Secondary | ICD-10-CM | POA: Diagnosis not present

## 2023-11-21 DIAGNOSIS — Z430 Encounter for attention to tracheostomy: Secondary | ICD-10-CM | POA: Diagnosis not present

## 2023-11-21 DIAGNOSIS — G4733 Obstructive sleep apnea (adult) (pediatric): Secondary | ICD-10-CM | POA: Diagnosis not present

## 2023-11-21 DIAGNOSIS — Z23 Encounter for immunization: Secondary | ICD-10-CM | POA: Diagnosis not present

## 2023-11-29 ENCOUNTER — Other Ambulatory Visit: Payer: Self-pay | Admitting: Specialist

## 2023-11-29 DIAGNOSIS — R918 Other nonspecific abnormal finding of lung field: Secondary | ICD-10-CM

## 2023-12-10 ENCOUNTER — Ambulatory Visit
Admission: RE | Admit: 2023-12-10 | Discharge: 2023-12-10 | Disposition: A | Discharge status: Home / Self Care | Source: Ambulatory Visit | Attending: Specialist | Admitting: Specialist

## 2023-12-10 DIAGNOSIS — J439 Emphysema, unspecified: Secondary | ICD-10-CM | POA: Diagnosis not present

## 2023-12-10 DIAGNOSIS — R918 Other nonspecific abnormal finding of lung field: Secondary | ICD-10-CM | POA: Diagnosis not present

## 2023-12-12 DIAGNOSIS — H02889 Meibomian gland dysfunction of unspecified eye, unspecified eyelid: Secondary | ICD-10-CM | POA: Diagnosis not present

## 2023-12-12 DIAGNOSIS — E119 Type 2 diabetes mellitus without complications: Secondary | ICD-10-CM | POA: Diagnosis not present

## 2023-12-12 DIAGNOSIS — H2513 Age-related nuclear cataract, bilateral: Secondary | ICD-10-CM | POA: Diagnosis not present

## 2023-12-12 DIAGNOSIS — H04123 Dry eye syndrome of bilateral lacrimal glands: Secondary | ICD-10-CM | POA: Diagnosis not present

## 2024-01-16 DIAGNOSIS — Z430 Encounter for attention to tracheostomy: Secondary | ICD-10-CM | POA: Diagnosis not present

## 2024-01-16 DIAGNOSIS — G4733 Obstructive sleep apnea (adult) (pediatric): Secondary | ICD-10-CM | POA: Diagnosis not present
